# Patient Record
Sex: Female | Born: 1945 | Race: White | Hispanic: No | Marital: Married | Smoking: Never smoker
Health system: Southern US, Community
[De-identification: ages and names within clinical notes are randomized; demographics above are authoritative.]

## PROBLEM LIST (undated history)

## (undated) DIAGNOSIS — I639 Cerebral infarction, unspecified: Secondary | ICD-10-CM

## (undated) DIAGNOSIS — Z8701 Personal history of pneumonia (recurrent): Secondary | ICD-10-CM

## (undated) DIAGNOSIS — R269 Unspecified abnormalities of gait and mobility: Secondary | ICD-10-CM

## (undated) DIAGNOSIS — I69359 Hemiplegia and hemiparesis following cerebral infarction affecting unspecified side: Secondary | ICD-10-CM

## (undated) DIAGNOSIS — E119 Type 2 diabetes mellitus without complications: Secondary | ICD-10-CM

## (undated) DIAGNOSIS — I10 Essential (primary) hypertension: Secondary | ICD-10-CM

## (undated) DIAGNOSIS — I69398 Other sequelae of cerebral infarction: Principal | ICD-10-CM

## (undated) DIAGNOSIS — Z952 Presence of prosthetic heart valve: Secondary | ICD-10-CM

## (undated) DIAGNOSIS — T7840XA Allergy, unspecified, initial encounter: Secondary | ICD-10-CM

## (undated) DIAGNOSIS — K219 Gastro-esophageal reflux disease without esophagitis: Secondary | ICD-10-CM

## (undated) HISTORY — DX: Allergy, unspecified, initial encounter: T78.40XA

## (undated) HISTORY — PX: BRAIN SURGERY: SHX531

## (undated) HISTORY — DX: Other sequelae of cerebral infarction: I69.398

## (undated) HISTORY — DX: Type 2 diabetes mellitus without complications: E11.9

## (undated) HISTORY — DX: Presence of prosthetic heart valve: Z95.2

## (undated) HISTORY — PX: ABDOMINAL HYSTERECTOMY: SHX81

## (undated) HISTORY — DX: Unspecified abnormalities of gait and mobility: R26.9

## (undated) HISTORY — DX: Cerebral infarction, unspecified: I63.9

## (undated) HISTORY — DX: Hemiplegia and hemiparesis following cerebral infarction affecting unspecified side: I69.359

## (undated) HISTORY — DX: Personal history of pneumonia (recurrent): Z87.01

---

## 1991-11-09 HISTORY — PX: BUNIONECTOMY: SHX129

## 1994-11-08 HISTORY — PX: TOE SURGERY: SHX1073

## 1995-11-09 DIAGNOSIS — Z8701 Personal history of pneumonia (recurrent): Secondary | ICD-10-CM

## 1995-11-09 HISTORY — PX: CARDIAC VALVE REPLACEMENT: SHX585

## 1995-11-09 HISTORY — DX: Personal history of pneumonia (recurrent): Z87.01

## 2014-11-19 DIAGNOSIS — Z952 Presence of prosthetic heart valve: Secondary | ICD-10-CM | POA: Diagnosis not present

## 2014-11-20 DIAGNOSIS — K219 Gastro-esophageal reflux disease without esophagitis: Secondary | ICD-10-CM | POA: Diagnosis not present

## 2014-11-20 DIAGNOSIS — M810 Age-related osteoporosis without current pathological fracture: Secondary | ICD-10-CM | POA: Diagnosis not present

## 2014-11-20 DIAGNOSIS — M818 Other osteoporosis without current pathological fracture: Secondary | ICD-10-CM | POA: Diagnosis not present

## 2014-11-20 DIAGNOSIS — E559 Vitamin D deficiency, unspecified: Secondary | ICD-10-CM | POA: Diagnosis not present

## 2014-11-20 DIAGNOSIS — G464 Cerebellar stroke syndrome: Secondary | ICD-10-CM | POA: Diagnosis not present

## 2014-11-20 DIAGNOSIS — I638 Other cerebral infarction: Secondary | ICD-10-CM | POA: Diagnosis not present

## 2014-11-20 DIAGNOSIS — R739 Hyperglycemia, unspecified: Secondary | ICD-10-CM | POA: Diagnosis not present

## 2014-11-20 DIAGNOSIS — E785 Hyperlipidemia, unspecified: Secondary | ICD-10-CM | POA: Diagnosis not present

## 2014-11-20 DIAGNOSIS — R05 Cough: Secondary | ICD-10-CM | POA: Diagnosis not present

## 2014-11-20 DIAGNOSIS — I348 Other nonrheumatic mitral valve disorders: Secondary | ICD-10-CM | POA: Diagnosis not present

## 2014-11-20 DIAGNOSIS — I10 Essential (primary) hypertension: Secondary | ICD-10-CM | POA: Diagnosis not present

## 2014-11-21 DIAGNOSIS — M779 Enthesopathy, unspecified: Secondary | ICD-10-CM | POA: Diagnosis not present

## 2014-11-21 DIAGNOSIS — M6281 Muscle weakness (generalized): Secondary | ICD-10-CM | POA: Diagnosis not present

## 2014-11-21 DIAGNOSIS — M722 Plantar fascial fibromatosis: Secondary | ICD-10-CM | POA: Diagnosis not present

## 2014-11-21 DIAGNOSIS — R260 Ataxic gait: Secondary | ICD-10-CM | POA: Diagnosis not present

## 2014-11-28 DIAGNOSIS — R260 Ataxic gait: Secondary | ICD-10-CM | POA: Diagnosis not present

## 2014-11-28 DIAGNOSIS — M722 Plantar fascial fibromatosis: Secondary | ICD-10-CM | POA: Diagnosis not present

## 2014-11-28 DIAGNOSIS — M6281 Muscle weakness (generalized): Secondary | ICD-10-CM | POA: Diagnosis not present

## 2014-11-28 DIAGNOSIS — M779 Enthesopathy, unspecified: Secondary | ICD-10-CM | POA: Diagnosis not present

## 2014-12-02 DIAGNOSIS — M779 Enthesopathy, unspecified: Secondary | ICD-10-CM | POA: Diagnosis not present

## 2014-12-02 DIAGNOSIS — M6281 Muscle weakness (generalized): Secondary | ICD-10-CM | POA: Diagnosis not present

## 2014-12-02 DIAGNOSIS — R260 Ataxic gait: Secondary | ICD-10-CM | POA: Diagnosis not present

## 2014-12-02 DIAGNOSIS — M722 Plantar fascial fibromatosis: Secondary | ICD-10-CM | POA: Diagnosis not present

## 2014-12-17 DIAGNOSIS — Z952 Presence of prosthetic heart valve: Secondary | ICD-10-CM | POA: Diagnosis not present

## 2014-12-18 DIAGNOSIS — M779 Enthesopathy, unspecified: Secondary | ICD-10-CM | POA: Diagnosis not present

## 2014-12-18 DIAGNOSIS — M6281 Muscle weakness (generalized): Secondary | ICD-10-CM | POA: Diagnosis not present

## 2014-12-18 DIAGNOSIS — M722 Plantar fascial fibromatosis: Secondary | ICD-10-CM | POA: Diagnosis not present

## 2014-12-18 DIAGNOSIS — R260 Ataxic gait: Secondary | ICD-10-CM | POA: Diagnosis not present

## 2015-01-01 DIAGNOSIS — M722 Plantar fascial fibromatosis: Secondary | ICD-10-CM | POA: Diagnosis not present

## 2015-01-01 DIAGNOSIS — M779 Enthesopathy, unspecified: Secondary | ICD-10-CM | POA: Diagnosis not present

## 2015-01-01 DIAGNOSIS — M6281 Muscle weakness (generalized): Secondary | ICD-10-CM | POA: Diagnosis not present

## 2015-01-01 DIAGNOSIS — R260 Ataxic gait: Secondary | ICD-10-CM | POA: Diagnosis not present

## 2015-01-06 DIAGNOSIS — M779 Enthesopathy, unspecified: Secondary | ICD-10-CM | POA: Diagnosis not present

## 2015-01-06 DIAGNOSIS — M6281 Muscle weakness (generalized): Secondary | ICD-10-CM | POA: Diagnosis not present

## 2015-01-06 DIAGNOSIS — M722 Plantar fascial fibromatosis: Secondary | ICD-10-CM | POA: Diagnosis not present

## 2015-01-06 DIAGNOSIS — R260 Ataxic gait: Secondary | ICD-10-CM | POA: Diagnosis not present

## 2015-01-13 DIAGNOSIS — M722 Plantar fascial fibromatosis: Secondary | ICD-10-CM | POA: Diagnosis not present

## 2015-01-13 DIAGNOSIS — R260 Ataxic gait: Secondary | ICD-10-CM | POA: Diagnosis not present

## 2015-01-13 DIAGNOSIS — M779 Enthesopathy, unspecified: Secondary | ICD-10-CM | POA: Diagnosis not present

## 2015-01-13 DIAGNOSIS — M6281 Muscle weakness (generalized): Secondary | ICD-10-CM | POA: Diagnosis not present

## 2015-01-14 DIAGNOSIS — Z952 Presence of prosthetic heart valve: Secondary | ICD-10-CM | POA: Diagnosis not present

## 2015-01-20 DIAGNOSIS — M6281 Muscle weakness (generalized): Secondary | ICD-10-CM | POA: Diagnosis not present

## 2015-01-20 DIAGNOSIS — M779 Enthesopathy, unspecified: Secondary | ICD-10-CM | POA: Diagnosis not present

## 2015-01-20 DIAGNOSIS — R260 Ataxic gait: Secondary | ICD-10-CM | POA: Diagnosis not present

## 2015-01-20 DIAGNOSIS — M722 Plantar fascial fibromatosis: Secondary | ICD-10-CM | POA: Diagnosis not present

## 2015-01-27 DIAGNOSIS — M779 Enthesopathy, unspecified: Secondary | ICD-10-CM | POA: Diagnosis not present

## 2015-01-27 DIAGNOSIS — M722 Plantar fascial fibromatosis: Secondary | ICD-10-CM | POA: Diagnosis not present

## 2015-01-27 DIAGNOSIS — M6281 Muscle weakness (generalized): Secondary | ICD-10-CM | POA: Diagnosis not present

## 2015-01-27 DIAGNOSIS — R260 Ataxic gait: Secondary | ICD-10-CM | POA: Diagnosis not present

## 2015-02-05 DIAGNOSIS — D72818 Other decreased white blood cell count: Secondary | ICD-10-CM | POA: Diagnosis not present

## 2015-02-05 DIAGNOSIS — I638 Other cerebral infarction: Secondary | ICD-10-CM | POA: Diagnosis not present

## 2015-02-05 DIAGNOSIS — R5383 Other fatigue: Secondary | ICD-10-CM | POA: Diagnosis not present

## 2015-02-05 DIAGNOSIS — I10 Essential (primary) hypertension: Secondary | ICD-10-CM | POA: Diagnosis not present

## 2015-02-05 DIAGNOSIS — R05 Cough: Secondary | ICD-10-CM | POA: Diagnosis not present

## 2015-02-05 DIAGNOSIS — M818 Other osteoporosis without current pathological fracture: Secondary | ICD-10-CM | POA: Diagnosis not present

## 2015-02-05 DIAGNOSIS — E139 Other specified diabetes mellitus without complications: Secondary | ICD-10-CM | POA: Diagnosis not present

## 2015-02-10 DIAGNOSIS — M779 Enthesopathy, unspecified: Secondary | ICD-10-CM | POA: Diagnosis not present

## 2015-02-10 DIAGNOSIS — R260 Ataxic gait: Secondary | ICD-10-CM | POA: Diagnosis not present

## 2015-02-10 DIAGNOSIS — M722 Plantar fascial fibromatosis: Secondary | ICD-10-CM | POA: Diagnosis not present

## 2015-02-10 DIAGNOSIS — M6281 Muscle weakness (generalized): Secondary | ICD-10-CM | POA: Diagnosis not present

## 2015-02-11 DIAGNOSIS — Z952 Presence of prosthetic heart valve: Secondary | ICD-10-CM | POA: Diagnosis not present

## 2015-02-17 DIAGNOSIS — I6930 Unspecified sequelae of cerebral infarction: Secondary | ICD-10-CM | POA: Diagnosis not present

## 2015-02-17 DIAGNOSIS — E785 Hyperlipidemia, unspecified: Secondary | ICD-10-CM | POA: Diagnosis not present

## 2015-02-17 DIAGNOSIS — I638 Other cerebral infarction: Secondary | ICD-10-CM | POA: Diagnosis not present

## 2015-02-17 DIAGNOSIS — M722 Plantar fascial fibromatosis: Secondary | ICD-10-CM | POA: Diagnosis not present

## 2015-02-17 DIAGNOSIS — M6281 Muscle weakness (generalized): Secondary | ICD-10-CM | POA: Diagnosis not present

## 2015-02-17 DIAGNOSIS — I10 Essential (primary) hypertension: Secondary | ICD-10-CM | POA: Diagnosis not present

## 2015-02-17 DIAGNOSIS — E559 Vitamin D deficiency, unspecified: Secondary | ICD-10-CM | POA: Diagnosis not present

## 2015-02-17 DIAGNOSIS — Z1389 Encounter for screening for other disorder: Secondary | ICD-10-CM | POA: Diagnosis not present

## 2015-02-17 DIAGNOSIS — R05 Cough: Secondary | ICD-10-CM | POA: Diagnosis not present

## 2015-02-17 DIAGNOSIS — K219 Gastro-esophageal reflux disease without esophagitis: Secondary | ICD-10-CM | POA: Diagnosis not present

## 2015-02-17 DIAGNOSIS — I348 Other nonrheumatic mitral valve disorders: Secondary | ICD-10-CM | POA: Diagnosis not present

## 2015-02-17 DIAGNOSIS — R260 Ataxic gait: Secondary | ICD-10-CM | POA: Diagnosis not present

## 2015-02-17 DIAGNOSIS — M779 Enthesopathy, unspecified: Secondary | ICD-10-CM | POA: Diagnosis not present

## 2015-02-24 DIAGNOSIS — M6281 Muscle weakness (generalized): Secondary | ICD-10-CM | POA: Diagnosis not present

## 2015-02-24 DIAGNOSIS — M779 Enthesopathy, unspecified: Secondary | ICD-10-CM | POA: Diagnosis not present

## 2015-02-24 DIAGNOSIS — M722 Plantar fascial fibromatosis: Secondary | ICD-10-CM | POA: Diagnosis not present

## 2015-02-24 DIAGNOSIS — R260 Ataxic gait: Secondary | ICD-10-CM | POA: Diagnosis not present

## 2015-02-27 DIAGNOSIS — Z7901 Long term (current) use of anticoagulants: Secondary | ICD-10-CM | POA: Diagnosis not present

## 2015-02-27 DIAGNOSIS — I340 Nonrheumatic mitral (valve) insufficiency: Secondary | ICD-10-CM | POA: Diagnosis not present

## 2015-02-27 DIAGNOSIS — I6350 Cerebral infarction due to unspecified occlusion or stenosis of unspecified cerebral artery: Secondary | ICD-10-CM | POA: Diagnosis not present

## 2015-02-27 DIAGNOSIS — I119 Hypertensive heart disease without heart failure: Secondary | ICD-10-CM | POA: Diagnosis not present

## 2015-03-03 DIAGNOSIS — M6281 Muscle weakness (generalized): Secondary | ICD-10-CM | POA: Diagnosis not present

## 2015-03-03 DIAGNOSIS — M722 Plantar fascial fibromatosis: Secondary | ICD-10-CM | POA: Diagnosis not present

## 2015-03-03 DIAGNOSIS — R260 Ataxic gait: Secondary | ICD-10-CM | POA: Diagnosis not present

## 2015-03-03 DIAGNOSIS — M779 Enthesopathy, unspecified: Secondary | ICD-10-CM | POA: Diagnosis not present

## 2015-03-07 DIAGNOSIS — N200 Calculus of kidney: Secondary | ICD-10-CM | POA: Diagnosis not present

## 2015-03-07 DIAGNOSIS — R319 Hematuria, unspecified: Secondary | ICD-10-CM | POA: Diagnosis not present

## 2015-03-07 DIAGNOSIS — N390 Urinary tract infection, site not specified: Secondary | ICD-10-CM | POA: Diagnosis not present

## 2015-03-07 DIAGNOSIS — Z6820 Body mass index (BMI) 20.0-20.9, adult: Secondary | ICD-10-CM | POA: Diagnosis not present

## 2015-03-11 DIAGNOSIS — Z952 Presence of prosthetic heart valve: Secondary | ICD-10-CM | POA: Diagnosis not present

## 2015-03-12 DIAGNOSIS — M722 Plantar fascial fibromatosis: Secondary | ICD-10-CM | POA: Diagnosis not present

## 2015-03-12 DIAGNOSIS — M779 Enthesopathy, unspecified: Secondary | ICD-10-CM | POA: Diagnosis not present

## 2015-03-12 DIAGNOSIS — M6281 Muscle weakness (generalized): Secondary | ICD-10-CM | POA: Diagnosis not present

## 2015-03-12 DIAGNOSIS — R260 Ataxic gait: Secondary | ICD-10-CM | POA: Diagnosis not present

## 2015-03-17 DIAGNOSIS — R260 Ataxic gait: Secondary | ICD-10-CM | POA: Diagnosis not present

## 2015-03-17 DIAGNOSIS — M779 Enthesopathy, unspecified: Secondary | ICD-10-CM | POA: Diagnosis not present

## 2015-03-17 DIAGNOSIS — M6281 Muscle weakness (generalized): Secondary | ICD-10-CM | POA: Diagnosis not present

## 2015-03-17 DIAGNOSIS — M722 Plantar fascial fibromatosis: Secondary | ICD-10-CM | POA: Diagnosis not present

## 2015-03-21 DIAGNOSIS — Z1231 Encounter for screening mammogram for malignant neoplasm of breast: Secondary | ICD-10-CM | POA: Diagnosis not present

## 2015-03-24 DIAGNOSIS — H16223 Keratoconjunctivitis sicca, not specified as Sjogren's, bilateral: Secondary | ICD-10-CM | POA: Diagnosis not present

## 2015-03-24 DIAGNOSIS — H2513 Age-related nuclear cataract, bilateral: Secondary | ICD-10-CM | POA: Diagnosis not present

## 2015-03-24 DIAGNOSIS — H5203 Hypermetropia, bilateral: Secondary | ICD-10-CM | POA: Diagnosis not present

## 2015-03-25 DIAGNOSIS — N209 Urinary calculus, unspecified: Secondary | ICD-10-CM | POA: Diagnosis not present

## 2015-03-25 DIAGNOSIS — Z87442 Personal history of urinary calculi: Secondary | ICD-10-CM | POA: Diagnosis not present

## 2015-03-25 DIAGNOSIS — R319 Hematuria, unspecified: Secondary | ICD-10-CM | POA: Diagnosis not present

## 2015-03-25 DIAGNOSIS — N3000 Acute cystitis without hematuria: Secondary | ICD-10-CM | POA: Diagnosis not present

## 2015-03-31 DIAGNOSIS — M6281 Muscle weakness (generalized): Secondary | ICD-10-CM | POA: Diagnosis not present

## 2015-03-31 DIAGNOSIS — M722 Plantar fascial fibromatosis: Secondary | ICD-10-CM | POA: Diagnosis not present

## 2015-03-31 DIAGNOSIS — R260 Ataxic gait: Secondary | ICD-10-CM | POA: Diagnosis not present

## 2015-03-31 DIAGNOSIS — M779 Enthesopathy, unspecified: Secondary | ICD-10-CM | POA: Diagnosis not present

## 2015-04-08 DIAGNOSIS — Z952 Presence of prosthetic heart valve: Secondary | ICD-10-CM | POA: Diagnosis not present

## 2015-04-08 DIAGNOSIS — R319 Hematuria, unspecified: Secondary | ICD-10-CM | POA: Diagnosis not present

## 2015-04-14 DIAGNOSIS — R260 Ataxic gait: Secondary | ICD-10-CM | POA: Diagnosis not present

## 2015-04-14 DIAGNOSIS — M6281 Muscle weakness (generalized): Secondary | ICD-10-CM | POA: Diagnosis not present

## 2015-04-14 DIAGNOSIS — M722 Plantar fascial fibromatosis: Secondary | ICD-10-CM | POA: Diagnosis not present

## 2015-04-14 DIAGNOSIS — M779 Enthesopathy, unspecified: Secondary | ICD-10-CM | POA: Diagnosis not present

## 2015-04-15 DIAGNOSIS — R319 Hematuria, unspecified: Secondary | ICD-10-CM | POA: Diagnosis not present

## 2015-04-22 DIAGNOSIS — R319 Hematuria, unspecified: Secondary | ICD-10-CM | POA: Diagnosis not present

## 2015-05-06 DIAGNOSIS — Z952 Presence of prosthetic heart valve: Secondary | ICD-10-CM | POA: Diagnosis not present

## 2015-05-13 DIAGNOSIS — Z7901 Long term (current) use of anticoagulants: Secondary | ICD-10-CM | POA: Diagnosis not present

## 2015-05-13 DIAGNOSIS — I340 Nonrheumatic mitral (valve) insufficiency: Secondary | ICD-10-CM | POA: Diagnosis not present

## 2015-05-13 DIAGNOSIS — I119 Hypertensive heart disease without heart failure: Secondary | ICD-10-CM | POA: Diagnosis not present

## 2015-05-13 DIAGNOSIS — I6350 Cerebral infarction due to unspecified occlusion or stenosis of unspecified cerebral artery: Secondary | ICD-10-CM | POA: Diagnosis not present

## 2015-05-14 DIAGNOSIS — R7301 Impaired fasting glucose: Secondary | ICD-10-CM | POA: Diagnosis not present

## 2015-05-14 DIAGNOSIS — R7309 Other abnormal glucose: Secondary | ICD-10-CM | POA: Diagnosis not present

## 2015-05-14 DIAGNOSIS — K219 Gastro-esophageal reflux disease without esophagitis: Secondary | ICD-10-CM | POA: Diagnosis not present

## 2015-05-14 DIAGNOSIS — I10 Essential (primary) hypertension: Secondary | ICD-10-CM | POA: Diagnosis not present

## 2015-05-14 DIAGNOSIS — R262 Difficulty in walking, not elsewhere classified: Secondary | ICD-10-CM | POA: Diagnosis not present

## 2015-05-14 DIAGNOSIS — I638 Other cerebral infarction: Secondary | ICD-10-CM | POA: Diagnosis not present

## 2015-05-14 DIAGNOSIS — I6930 Unspecified sequelae of cerebral infarction: Secondary | ICD-10-CM | POA: Diagnosis not present

## 2015-05-21 DIAGNOSIS — K219 Gastro-esophageal reflux disease without esophagitis: Secondary | ICD-10-CM | POA: Diagnosis not present

## 2015-05-21 DIAGNOSIS — I348 Other nonrheumatic mitral valve disorders: Secondary | ICD-10-CM | POA: Diagnosis not present

## 2015-05-21 DIAGNOSIS — M810 Age-related osteoporosis without current pathological fracture: Secondary | ICD-10-CM | POA: Diagnosis not present

## 2015-05-21 DIAGNOSIS — I638 Other cerebral infarction: Secondary | ICD-10-CM | POA: Diagnosis not present

## 2015-05-21 DIAGNOSIS — M818 Other osteoporosis without current pathological fracture: Secondary | ICD-10-CM | POA: Diagnosis not present

## 2015-05-21 DIAGNOSIS — R05 Cough: Secondary | ICD-10-CM | POA: Diagnosis not present

## 2015-05-21 DIAGNOSIS — E860 Dehydration: Secondary | ICD-10-CM | POA: Diagnosis not present

## 2015-05-21 DIAGNOSIS — R7301 Impaired fasting glucose: Secondary | ICD-10-CM | POA: Diagnosis not present

## 2015-05-21 DIAGNOSIS — Z7901 Long term (current) use of anticoagulants: Secondary | ICD-10-CM | POA: Diagnosis not present

## 2015-05-21 DIAGNOSIS — E785 Hyperlipidemia, unspecified: Secondary | ICD-10-CM | POA: Diagnosis not present

## 2015-05-21 DIAGNOSIS — K649 Unspecified hemorrhoids: Secondary | ICD-10-CM | POA: Diagnosis not present

## 2015-05-21 DIAGNOSIS — I10 Essential (primary) hypertension: Secondary | ICD-10-CM | POA: Diagnosis not present

## 2015-05-27 DIAGNOSIS — Z7901 Long term (current) use of anticoagulants: Secondary | ICD-10-CM | POA: Diagnosis not present

## 2015-05-27 DIAGNOSIS — I639 Cerebral infarction, unspecified: Secondary | ICD-10-CM | POA: Diagnosis not present

## 2015-05-27 DIAGNOSIS — R2681 Unsteadiness on feet: Secondary | ICD-10-CM | POA: Diagnosis not present

## 2015-05-27 DIAGNOSIS — Z8673 Personal history of transient ischemic attack (TIA), and cerebral infarction without residual deficits: Secondary | ICD-10-CM | POA: Diagnosis not present

## 2015-05-27 DIAGNOSIS — I071 Rheumatic tricuspid insufficiency: Secondary | ICD-10-CM | POA: Diagnosis not present

## 2015-05-27 DIAGNOSIS — G319 Degenerative disease of nervous system, unspecified: Secondary | ICD-10-CM | POA: Diagnosis not present

## 2015-05-27 DIAGNOSIS — R9089 Other abnormal findings on diagnostic imaging of central nervous system: Secondary | ICD-10-CM | POA: Diagnosis not present

## 2015-05-27 DIAGNOSIS — R270 Ataxia, unspecified: Secondary | ICD-10-CM | POA: Diagnosis not present

## 2015-05-27 DIAGNOSIS — Z954 Presence of other heart-valve replacement: Secondary | ICD-10-CM | POA: Diagnosis not present

## 2015-05-27 DIAGNOSIS — E785 Hyperlipidemia, unspecified: Secondary | ICD-10-CM | POA: Diagnosis not present

## 2015-05-27 DIAGNOSIS — R531 Weakness: Secondary | ICD-10-CM | POA: Diagnosis not present

## 2015-05-27 DIAGNOSIS — Z888 Allergy status to other drugs, medicaments and biological substances status: Secondary | ICD-10-CM | POA: Diagnosis not present

## 2015-05-27 DIAGNOSIS — G9389 Other specified disorders of brain: Secondary | ICD-10-CM | POA: Diagnosis not present

## 2015-05-27 DIAGNOSIS — I10 Essential (primary) hypertension: Secondary | ICD-10-CM | POA: Diagnosis not present

## 2015-05-27 DIAGNOSIS — G459 Transient cerebral ischemic attack, unspecified: Secondary | ICD-10-CM | POA: Diagnosis not present

## 2015-05-27 DIAGNOSIS — M810 Age-related osteoporosis without current pathological fracture: Secondary | ICD-10-CM | POA: Diagnosis not present

## 2015-05-28 DIAGNOSIS — Z7901 Long term (current) use of anticoagulants: Secondary | ICD-10-CM | POA: Diagnosis not present

## 2015-05-28 DIAGNOSIS — R531 Weakness: Secondary | ICD-10-CM | POA: Diagnosis not present

## 2015-05-28 DIAGNOSIS — Z8673 Personal history of transient ischemic attack (TIA), and cerebral infarction without residual deficits: Secondary | ICD-10-CM | POA: Diagnosis not present

## 2015-05-28 DIAGNOSIS — R2681 Unsteadiness on feet: Secondary | ICD-10-CM | POA: Diagnosis not present

## 2015-05-29 DIAGNOSIS — I348 Other nonrheumatic mitral valve disorders: Secondary | ICD-10-CM | POA: Diagnosis not present

## 2015-05-29 DIAGNOSIS — E559 Vitamin D deficiency, unspecified: Secondary | ICD-10-CM | POA: Diagnosis not present

## 2015-05-29 DIAGNOSIS — I10 Essential (primary) hypertension: Secondary | ICD-10-CM | POA: Diagnosis not present

## 2015-05-29 DIAGNOSIS — G459 Transient cerebral ischemic attack, unspecified: Secondary | ICD-10-CM | POA: Diagnosis not present

## 2015-06-10 DIAGNOSIS — Z952 Presence of prosthetic heart valve: Secondary | ICD-10-CM | POA: Diagnosis not present

## 2015-06-19 DIAGNOSIS — M722 Plantar fascial fibromatosis: Secondary | ICD-10-CM | POA: Diagnosis not present

## 2015-06-19 DIAGNOSIS — M6281 Muscle weakness (generalized): Secondary | ICD-10-CM | POA: Diagnosis not present

## 2015-06-19 DIAGNOSIS — R260 Ataxic gait: Secondary | ICD-10-CM | POA: Diagnosis not present

## 2015-06-19 DIAGNOSIS — M779 Enthesopathy, unspecified: Secondary | ICD-10-CM | POA: Diagnosis not present

## 2015-06-24 DIAGNOSIS — R05 Cough: Secondary | ICD-10-CM | POA: Diagnosis not present

## 2015-06-24 DIAGNOSIS — K649 Unspecified hemorrhoids: Secondary | ICD-10-CM | POA: Diagnosis not present

## 2015-06-24 DIAGNOSIS — I638 Other cerebral infarction: Secondary | ICD-10-CM | POA: Diagnosis not present

## 2015-06-24 DIAGNOSIS — E559 Vitamin D deficiency, unspecified: Secondary | ICD-10-CM | POA: Diagnosis not present

## 2015-06-24 DIAGNOSIS — K219 Gastro-esophageal reflux disease without esophagitis: Secondary | ICD-10-CM | POA: Diagnosis not present

## 2015-06-24 DIAGNOSIS — I348 Other nonrheumatic mitral valve disorders: Secondary | ICD-10-CM | POA: Diagnosis not present

## 2015-06-24 DIAGNOSIS — E785 Hyperlipidemia, unspecified: Secondary | ICD-10-CM | POA: Diagnosis not present

## 2015-06-24 DIAGNOSIS — I10 Essential (primary) hypertension: Secondary | ICD-10-CM | POA: Diagnosis not present

## 2015-06-26 DIAGNOSIS — M6281 Muscle weakness (generalized): Secondary | ICD-10-CM | POA: Diagnosis not present

## 2015-06-26 DIAGNOSIS — R260 Ataxic gait: Secondary | ICD-10-CM | POA: Diagnosis not present

## 2015-06-26 DIAGNOSIS — M779 Enthesopathy, unspecified: Secondary | ICD-10-CM | POA: Diagnosis not present

## 2015-06-26 DIAGNOSIS — M722 Plantar fascial fibromatosis: Secondary | ICD-10-CM | POA: Diagnosis not present

## 2015-07-03 ENCOUNTER — Ambulatory Visit (INDEPENDENT_AMBULATORY_CARE_PROVIDER_SITE_OTHER): Payer: Medicare Other | Admitting: Family Medicine

## 2015-07-03 VITALS — BP 102/60 | HR 75 | Temp 97.8°F | Resp 16 | Ht 66.0 in | Wt 127.9 lb

## 2015-07-03 DIAGNOSIS — H6691 Otitis media, unspecified, right ear: Secondary | ICD-10-CM | POA: Diagnosis not present

## 2015-07-03 DIAGNOSIS — J029 Acute pharyngitis, unspecified: Secondary | ICD-10-CM

## 2015-07-03 LAB — POCT RAPID STREP A (OFFICE): Rapid Strep A Screen: NEGATIVE

## 2015-07-03 MED ORDER — AMOXICILLIN 875 MG PO TABS: 875.0000 mg | ORAL_TABLET | Freq: Two times a day (BID) | ORAL | Status: DC

## 2015-07-03 NOTE — Progress Notes (Signed)
Sore throat Subjective:  Patient ID: [name], female    DOB: [birth year]  Age: 69 y.o. MRN: [id number]  69 year old lady who has a sore throat. It started yesterday morning night before. She has not been running any fever. She does not complain of her ears or head or cough. She has had a little nasal congestion here in the cold examining room.  Her husband has not had any symptoms, nor have any close relatives that she knows of  She has a history of several strokes and has had a valve replaced so she is on chronic anticoagulation therapy   Objective:   Pleasant alert lady in no major distress. Left TM is normal. Right TM has redness a couple of millimeters along the base of the drum. Her throat is red without exudate. Strep culture taken. Neck supple small anterior cervical node. Chest is clear to auscultation. Heart has the click of the artificial valve. Is regular.  Assessment & Plan:   Assessment:  Sore throat Early right otitis  Plan:  Strep test and culture if negative  Results for orders placed or performed in visit on 07/03/15  POCT rapid strep A  Result Value Ref Range   Rapid Strep A Screen Negative Negative     There are no Patient Instructions on file for this visit.   Robina Hamor, MD 07/03/2015

## 2015-07-03 NOTE — Patient Instructions (Signed)
Take amoxicillin one twice daily  We will let you know in a few days the results of your culture. Should you not hear from [place] by Monday please call back. If it is negative you can discontinue the anabiotics if the ear is feeling well.  Return as needed

## 2015-07-05 LAB — CULTURE, GROUP A STREP: Organism ID, Bacteria: NORMAL

## 2015-07-08 DIAGNOSIS — Z952 Presence of prosthetic heart valve: Secondary | ICD-10-CM | POA: Diagnosis not present

## 2015-07-16 DIAGNOSIS — E039 Hypothyroidism, unspecified: Secondary | ICD-10-CM | POA: Diagnosis not present

## 2015-07-16 DIAGNOSIS — R260 Ataxic gait: Secondary | ICD-10-CM | POA: Diagnosis not present

## 2015-07-16 DIAGNOSIS — M859 Disorder of bone density and structure, unspecified: Secondary | ICD-10-CM | POA: Diagnosis not present

## 2015-07-16 DIAGNOSIS — I10 Essential (primary) hypertension: Secondary | ICD-10-CM | POA: Diagnosis not present

## 2015-07-16 DIAGNOSIS — E139 Other specified diabetes mellitus without complications: Secondary | ICD-10-CM | POA: Diagnosis not present

## 2015-07-16 DIAGNOSIS — I348 Other nonrheumatic mitral valve disorders: Secondary | ICD-10-CM | POA: Diagnosis not present

## 2015-07-16 DIAGNOSIS — R05 Cough: Secondary | ICD-10-CM | POA: Diagnosis not present

## 2015-07-16 DIAGNOSIS — M6281 Muscle weakness (generalized): Secondary | ICD-10-CM | POA: Diagnosis not present

## 2015-07-16 DIAGNOSIS — M722 Plantar fascial fibromatosis: Secondary | ICD-10-CM | POA: Diagnosis not present

## 2015-07-16 DIAGNOSIS — E559 Vitamin D deficiency, unspecified: Secondary | ICD-10-CM | POA: Diagnosis not present

## 2015-07-16 DIAGNOSIS — N959 Unspecified menopausal and perimenopausal disorder: Secondary | ICD-10-CM | POA: Diagnosis not present

## 2015-07-16 DIAGNOSIS — M779 Enthesopathy, unspecified: Secondary | ICD-10-CM | POA: Diagnosis not present

## 2015-07-16 DIAGNOSIS — R5383 Other fatigue: Secondary | ICD-10-CM | POA: Diagnosis not present

## 2015-07-18 DIAGNOSIS — M779 Enthesopathy, unspecified: Secondary | ICD-10-CM | POA: Diagnosis not present

## 2015-07-18 DIAGNOSIS — R260 Ataxic gait: Secondary | ICD-10-CM | POA: Diagnosis not present

## 2015-07-18 DIAGNOSIS — M722 Plantar fascial fibromatosis: Secondary | ICD-10-CM | POA: Diagnosis not present

## 2015-07-18 DIAGNOSIS — M6281 Muscle weakness (generalized): Secondary | ICD-10-CM | POA: Diagnosis not present

## 2015-07-23 DIAGNOSIS — Z1211 Encounter for screening for malignant neoplasm of colon: Secondary | ICD-10-CM | POA: Diagnosis not present

## 2015-07-23 DIAGNOSIS — Z1212 Encounter for screening for malignant neoplasm of rectum: Secondary | ICD-10-CM | POA: Diagnosis not present

## 2015-07-24 DIAGNOSIS — M779 Enthesopathy, unspecified: Secondary | ICD-10-CM | POA: Diagnosis not present

## 2015-07-24 DIAGNOSIS — R260 Ataxic gait: Secondary | ICD-10-CM | POA: Diagnosis not present

## 2015-07-24 DIAGNOSIS — M722 Plantar fascial fibromatosis: Secondary | ICD-10-CM | POA: Diagnosis not present

## 2015-07-24 DIAGNOSIS — M6281 Muscle weakness (generalized): Secondary | ICD-10-CM | POA: Diagnosis not present

## 2015-07-29 DIAGNOSIS — M6281 Muscle weakness (generalized): Secondary | ICD-10-CM | POA: Diagnosis not present

## 2015-07-29 DIAGNOSIS — R260 Ataxic gait: Secondary | ICD-10-CM | POA: Diagnosis not present

## 2015-07-29 DIAGNOSIS — M722 Plantar fascial fibromatosis: Secondary | ICD-10-CM | POA: Diagnosis not present

## 2015-07-29 DIAGNOSIS — M779 Enthesopathy, unspecified: Secondary | ICD-10-CM | POA: Diagnosis not present

## 2015-08-04 DIAGNOSIS — Z952 Presence of prosthetic heart valve: Secondary | ICD-10-CM | POA: Diagnosis not present

## 2015-08-11 DIAGNOSIS — Z7901 Long term (current) use of anticoagulants: Secondary | ICD-10-CM | POA: Diagnosis not present

## 2015-08-11 DIAGNOSIS — I348 Other nonrheumatic mitral valve disorders: Secondary | ICD-10-CM | POA: Diagnosis not present

## 2015-08-15 DIAGNOSIS — L03032 Cellulitis of left toe: Secondary | ICD-10-CM | POA: Diagnosis not present

## 2015-08-18 DIAGNOSIS — I349 Nonrheumatic mitral valve disorder, unspecified: Secondary | ICD-10-CM | POA: Diagnosis not present

## 2015-08-18 DIAGNOSIS — Z7901 Long term (current) use of anticoagulants: Secondary | ICD-10-CM | POA: Diagnosis not present

## 2015-08-20 DIAGNOSIS — I69354 Hemiplegia and hemiparesis following cerebral infarction affecting left non-dominant side: Secondary | ICD-10-CM | POA: Diagnosis not present

## 2015-08-25 DIAGNOSIS — Z7901 Long term (current) use of anticoagulants: Secondary | ICD-10-CM | POA: Diagnosis not present

## 2015-08-25 DIAGNOSIS — I349 Nonrheumatic mitral valve disorder, unspecified: Secondary | ICD-10-CM | POA: Diagnosis not present

## 2015-08-29 DIAGNOSIS — L03032 Cellulitis of left toe: Secondary | ICD-10-CM | POA: Diagnosis not present

## 2015-09-01 DIAGNOSIS — Z952 Presence of prosthetic heart valve: Secondary | ICD-10-CM | POA: Diagnosis not present

## 2015-09-01 DIAGNOSIS — I349 Nonrheumatic mitral valve disorder, unspecified: Secondary | ICD-10-CM | POA: Diagnosis not present

## 2015-09-01 DIAGNOSIS — Z23 Encounter for immunization: Secondary | ICD-10-CM | POA: Diagnosis not present

## 2015-09-01 DIAGNOSIS — Z7901 Long term (current) use of anticoagulants: Secondary | ICD-10-CM | POA: Diagnosis not present

## 2015-09-02 DIAGNOSIS — I679 Cerebrovascular disease, unspecified: Secondary | ICD-10-CM | POA: Diagnosis not present

## 2015-09-02 DIAGNOSIS — R7301 Impaired fasting glucose: Secondary | ICD-10-CM | POA: Diagnosis not present

## 2015-09-02 DIAGNOSIS — Z1389 Encounter for screening for other disorder: Secondary | ICD-10-CM | POA: Diagnosis not present

## 2015-09-02 DIAGNOSIS — E7800 Pure hypercholesterolemia, unspecified: Secondary | ICD-10-CM | POA: Diagnosis not present

## 2015-09-02 DIAGNOSIS — I69998 Other sequelae following unspecified cerebrovascular disease: Secondary | ICD-10-CM | POA: Diagnosis not present

## 2015-09-02 DIAGNOSIS — D692 Other nonthrombocytopenic purpura: Secondary | ICD-10-CM | POA: Diagnosis not present

## 2015-09-02 DIAGNOSIS — L03039 Cellulitis of unspecified toe: Secondary | ICD-10-CM | POA: Diagnosis not present

## 2015-09-02 DIAGNOSIS — K219 Gastro-esophageal reflux disease without esophagitis: Secondary | ICD-10-CM | POA: Diagnosis not present

## 2015-09-02 DIAGNOSIS — Z681 Body mass index (BMI) 19 or less, adult: Secondary | ICD-10-CM | POA: Diagnosis not present

## 2015-09-02 DIAGNOSIS — Z7901 Long term (current) use of anticoagulants: Secondary | ICD-10-CM | POA: Diagnosis not present

## 2015-09-02 DIAGNOSIS — I10 Essential (primary) hypertension: Secondary | ICD-10-CM | POA: Diagnosis not present

## 2015-09-02 DIAGNOSIS — M810 Age-related osteoporosis without current pathological fracture: Secondary | ICD-10-CM | POA: Diagnosis not present

## 2015-09-04 ENCOUNTER — Ambulatory Visit: Payer: Self-pay | Admitting: Cardiovascular Disease

## 2015-09-08 ENCOUNTER — Encounter: Payer: Self-pay | Admitting: Cardiovascular Disease

## 2015-09-08 DIAGNOSIS — Z7901 Long term (current) use of anticoagulants: Secondary | ICD-10-CM | POA: Diagnosis not present

## 2015-09-08 DIAGNOSIS — I349 Nonrheumatic mitral valve disorder, unspecified: Secondary | ICD-10-CM | POA: Diagnosis not present

## 2015-09-23 DIAGNOSIS — Z7901 Long term (current) use of anticoagulants: Secondary | ICD-10-CM | POA: Diagnosis not present

## 2015-09-23 DIAGNOSIS — I349 Nonrheumatic mitral valve disorder, unspecified: Secondary | ICD-10-CM | POA: Diagnosis not present

## 2015-09-23 DIAGNOSIS — I6350 Cerebral infarction due to unspecified occlusion or stenosis of unspecified cerebral artery: Secondary | ICD-10-CM | POA: Diagnosis not present

## 2015-09-23 DIAGNOSIS — I340 Nonrheumatic mitral (valve) insufficiency: Secondary | ICD-10-CM | POA: Diagnosis not present

## 2015-09-29 DIAGNOSIS — I340 Nonrheumatic mitral (valve) insufficiency: Secondary | ICD-10-CM | POA: Diagnosis not present

## 2015-09-29 DIAGNOSIS — Z952 Presence of prosthetic heart valve: Secondary | ICD-10-CM | POA: Diagnosis not present

## 2015-09-29 DIAGNOSIS — I6350 Cerebral infarction due to unspecified occlusion or stenosis of unspecified cerebral artery: Secondary | ICD-10-CM | POA: Diagnosis not present

## 2015-09-29 DIAGNOSIS — Z7901 Long term (current) use of anticoagulants: Secondary | ICD-10-CM | POA: Diagnosis not present

## 2015-09-29 DIAGNOSIS — I349 Nonrheumatic mitral valve disorder, unspecified: Secondary | ICD-10-CM | POA: Diagnosis not present

## 2015-10-01 DIAGNOSIS — E7800 Pure hypercholesterolemia, unspecified: Secondary | ICD-10-CM | POA: Diagnosis not present

## 2015-10-06 DIAGNOSIS — I349 Nonrheumatic mitral valve disorder, unspecified: Secondary | ICD-10-CM | POA: Diagnosis not present

## 2015-10-06 DIAGNOSIS — I6350 Cerebral infarction due to unspecified occlusion or stenosis of unspecified cerebral artery: Secondary | ICD-10-CM | POA: Diagnosis not present

## 2015-10-06 DIAGNOSIS — Z7901 Long term (current) use of anticoagulants: Secondary | ICD-10-CM | POA: Diagnosis not present

## 2015-10-06 DIAGNOSIS — I340 Nonrheumatic mitral (valve) insufficiency: Secondary | ICD-10-CM | POA: Diagnosis not present

## 2015-10-13 DIAGNOSIS — Z7901 Long term (current) use of anticoagulants: Secondary | ICD-10-CM | POA: Diagnosis not present

## 2015-10-13 DIAGNOSIS — I6350 Cerebral infarction due to unspecified occlusion or stenosis of unspecified cerebral artery: Secondary | ICD-10-CM | POA: Diagnosis not present

## 2015-10-13 DIAGNOSIS — I340 Nonrheumatic mitral (valve) insufficiency: Secondary | ICD-10-CM | POA: Diagnosis not present

## 2015-10-14 DIAGNOSIS — E119 Type 2 diabetes mellitus without complications: Secondary | ICD-10-CM | POA: Insufficient documentation

## 2015-10-14 DIAGNOSIS — I69359 Hemiplegia and hemiparesis following cerebral infarction affecting unspecified side: Secondary | ICD-10-CM | POA: Insufficient documentation

## 2015-10-16 ENCOUNTER — Ambulatory Visit (INDEPENDENT_AMBULATORY_CARE_PROVIDER_SITE_OTHER): Payer: Medicare Other | Admitting: Cardiovascular Disease

## 2015-10-16 ENCOUNTER — Encounter: Payer: Self-pay | Admitting: Cardiovascular Disease

## 2015-10-16 VITALS — BP 110/68 | HR 74 | Ht 66.0 in | Wt 128.8 lb

## 2015-10-16 DIAGNOSIS — I10 Essential (primary) hypertension: Secondary | ICD-10-CM | POA: Diagnosis not present

## 2015-10-16 DIAGNOSIS — Z952 Presence of prosthetic heart valve: Secondary | ICD-10-CM | POA: Insufficient documentation

## 2015-10-16 DIAGNOSIS — Z954 Presence of other heart-valve replacement: Secondary | ICD-10-CM | POA: Diagnosis not present

## 2015-10-16 NOTE — Patient Instructions (Signed)
Medication Instructions:  Your physician recommends that you continue on your current medications as directed. Please refer to the Current Medication list given to you today.  Labwork: None  Testing/Procedures: None  Follow-Up: Your physician wants you to follow-up in: 6 months with Dr. Nahser.  You will receive a reminder letter in the mail two months in advance. If you don't receive a letter, please call our office to schedule the follow-up appointment.   Any Other Special Instructions Will Be Listed Below (If Applicable).     If you need a refill on your cardiac medications before your next appointment, please call your pharmacy.   

## 2015-10-16 NOTE — Progress Notes (Signed)
Cardiology Office Note   Date:  10/16/2015   ID:  [name], DOB [birth year], MRN [id number]  PCP:  Haywood Pao, MD  Cardiologist:   Acie Fredrickson Wonda Cheng, MD   Chief Complaint  Patient presents with  . Mitral valve replacement   Problem List 1. Mitral valve replacement  - at age 69.  2. Essential HTN 3. Hyperlipidemia 4. DM.    History of Present Illness: [name] is a 69 y.o. female who presents for management of her mitral valve replacement . Was seen with her husband [name].  Moved from [place], [place] recently  Has had 4 strokes and a TIA.  She does her own INR monitoring  - the strokes were not associated with marked INR variations Exercises some, not as much as when she lived in [place].   Does water aerobics.  Walks on occasion .  Also does physical therapy exercises.     Past Medical History  Diagnosis Date  . Allergy   . Diabetes mellitus without complication ([place])   . Stroke [facility])     Past Surgical History  Procedure Laterality Date  . Brain surgery    . Abdominal hysterectomy    . Cardiac valve replacement       Current Outpatient Prescriptions  Medication Sig Dispense Refill  . AMLODIPINE BESYLATE PO Take 10 mg by mouth daily.    Marland Kitchen amoxicillin (AMOXIL) 875 MG tablet Take 1 tablet (875 mg total) by mouth 2 (two) times daily. 20 tablet 0  . atorvastatin (LIPITOR) 10 MG tablet Take 10 mg by mouth daily.    . benzonatate (TESSALON) 100 MG capsule Take 100 mg by mouth as needed for cough.    . calcium carbonate 1250 MG capsule Take 1,200 mg by mouth 2 (two) times daily with a meal.    . cholecalciferol (VITAMIN D) 1000 UNITS tablet Take 2,000 Units by mouth daily.    Marland Kitchen denosumab (PROLIA) 60 MG/ML SOLN injection Inject 60 mg into the skin every 6 (six) months. Administer in upper arm, thigh, or abdomen    . docusate sodium (COLACE) 100 MG capsule Take 100 mg by mouth 2 (two) times daily.    . metoprolol succinate (TOPROL-XL) 12.5 mg TB24 24 hr tablet  Take 12.5 mg by mouth daily.    Marland Kitchen OMEPRAZOLE PO Take 20 mg by mouth daily.    Marland Kitchen warfarin (COUMADIN) 5 MG tablet Take 5 mg by mouth daily.     No current facility-administered medications for this visit.    Allergies:   Zoloft    Social History:  The patient  reports that she has never smoked. She does not have any smokeless tobacco history on file.   Family History:  The patient's family history includes Cancer in her mother; Heart disease in her mother; Hyperlipidemia in her mother; Stroke in her father.    ROS:  Please see the history of present illness.    Review of Systems: Constitutional:  denies fever, chills, diaphoresis, appetite change and fatigue.  HEENT: denies photophobia, eye pain, redness, hearing loss, ear pain, congestion, sore throat, rhinorrhea, sneezing, neck pain, neck stiffness and tinnitus.  Respiratory: denies SOB, DOE, cough, chest tightness, and wheezing.  Cardiovascular: denies chest pain, palpitations and leg swelling.  Gastrointestinal: denies nausea, vomiting, abdominal pain, diarrhea, constipation, blood in stool.  Genitourinary: denies dysuria, urgency, frequency, hematuria, flank pain and difficulty urinating.  Musculoskeletal: denies  myalgias, back pain, joint swelling, arthralgias and gait problem.   Skin:  denies pallor, rash and wound.  Neurological: denies dizziness, seizures, syncope, weakness, light-headedness, numbness and headaches.   Hematological: denies adenopathy, easy bruising, personal or family bleeding history.  Psychiatric/ Behavioral: denies suicidal ideation, mood changes, confusion, nervousness, sleep disturbance and agitation.       All other systems are reviewed and negative.    PHYSICAL EXAM: VS:  BP 110/68 mmHg  Pulse 74  Ht 5\' 6"  (1.676 m)  Wt 128 lb 12.8 oz (58.423 kg)  BMI 20.80 kg/m2 , BMI Body mass index is 20.8 kg/(m^2). GEN: Well nourished, well developed, in no acute distress HEENT: normal Neck: no JVD,  carotid bruits, or masses Cardiac: RRR; no murmurs, rubs, or gallops,no edema  Respiratory:  clear to auscultation bilaterally, normal work of breathing GI: soft, nontender, nondistended, + BS MS: no deformity or atrophy Skin: warm and dry, no rash Neuro:  Strength and sensation are intact Psych: normal  he abdomen normal saline every every day recommendations everywhere  EKG:  EKG is ordered today. The ekg ordered today demonstrates NSR at 74 with 1st degree AV block with PVCs  Right superior axis  prior   Recent Labs: No results found for requested labs within last 365 days.    Lipid Panel No results found for: CHOL, TRIG, HDL, CHOLHDL, VLDL, LDLCALC, LDLDIRECT    Wt Readings from Last 3 Encounters:  10/16/15 128 lb 12.8 oz (58.423 kg)  07/03/15 127 lb 14.4 oz (58.015 kg)      Other studies Reviewed: Additional studies/ records that were reviewed today include:  Review of the above records demonstrates:    ASSESSMENT AND PLAN:  1.   Mitral valve replacement: [name] presents for follow-up of mitral valve placement. She had mechanical mitral valve placed at age 3. She monitors her INR levels at home. She has had a history of strokes.  continue current medications.   2. Hypertension: Continue Amlodipine 10 mg a day .   BP is stable      Current medicines are reviewed at length with the patient today.  The patient does not have concerns regarding medicines.  The following changes have been made:  no change  Labs/ tests ordered today include:  No orders of the defined types were placed in this encounter.     Disposition:   FU with me in 6 months      [name], Wonda Cheng, MD  10/16/2015 3:58 PM    [facility] [place], [place], [place]  [address] Phone: [phone]; Fax: [phone]   [facility]  [address] [place], [place]  [address] [phone]   Fax [phone]

## 2015-10-20 DIAGNOSIS — I340 Nonrheumatic mitral (valve) insufficiency: Secondary | ICD-10-CM | POA: Diagnosis not present

## 2015-10-20 DIAGNOSIS — I6350 Cerebral infarction due to unspecified occlusion or stenosis of unspecified cerebral artery: Secondary | ICD-10-CM | POA: Diagnosis not present

## 2015-10-20 DIAGNOSIS — Z7901 Long term (current) use of anticoagulants: Secondary | ICD-10-CM | POA: Diagnosis not present

## 2015-10-27 DIAGNOSIS — Z952 Presence of prosthetic heart valve: Secondary | ICD-10-CM | POA: Diagnosis not present

## 2015-11-03 LAB — POCT INR: INR: 3.6

## 2015-11-05 ENCOUNTER — Ambulatory Visit (INDEPENDENT_AMBULATORY_CARE_PROVIDER_SITE_OTHER): Payer: Medicare Other | Admitting: Pharmacist

## 2015-11-05 ENCOUNTER — Telehealth: Payer: Self-pay | Admitting: Cardiovascular Disease

## 2015-11-05 DIAGNOSIS — Z952 Presence of prosthetic heart valve: Secondary | ICD-10-CM

## 2015-11-05 DIAGNOSIS — Z954 Presence of other heart-valve replacement: Secondary | ICD-10-CM

## 2015-11-05 NOTE — Telephone Encounter (Signed)
New Message  Pt called request a call back to discuss ptinr results from home health and to discuss how to move forward.

## 2015-11-05 NOTE — Telephone Encounter (Signed)
Spoke with pt.  She checked her INR on Monday and has not heard anything back.  We completed paperwork to have self-testing results sent to [place] last week.  She states she spoke to a female at the company and everything was in process.  INR on Monday was 3.6.  She takes Coumadin 7.5mg  Tues, Sat and 5mg  other days.  She took 5mg  yesterday rather than 7.5mg .  Told her this was correct and we will plan on her rechecking her INR in 2 weeks.

## 2015-11-17 LAB — POCT INR: INR: 3

## 2015-11-21 ENCOUNTER — Ambulatory Visit (INDEPENDENT_AMBULATORY_CARE_PROVIDER_SITE_OTHER): Payer: Medicare Other | Admitting: Internal Medicine

## 2015-11-21 DIAGNOSIS — Z952 Presence of prosthetic heart valve: Secondary | ICD-10-CM

## 2015-11-21 DIAGNOSIS — Z954 Presence of other heart-valve replacement: Secondary | ICD-10-CM

## 2015-12-01 DIAGNOSIS — Z952 Presence of prosthetic heart valve: Secondary | ICD-10-CM | POA: Diagnosis not present

## 2015-12-01 LAB — POCT INR: INR: 3.5

## 2015-12-05 ENCOUNTER — Telehealth: Payer: Self-pay | Admitting: *Deleted

## 2015-12-05 ENCOUNTER — Ambulatory Visit (INDEPENDENT_AMBULATORY_CARE_PROVIDER_SITE_OTHER): Payer: Medicare Other | Admitting: Cardiovascular Disease

## 2015-12-05 DIAGNOSIS — Z952 Presence of prosthetic heart valve: Secondary | ICD-10-CM

## 2015-12-05 DIAGNOSIS — Z954 Presence of other heart-valve replacement: Secondary | ICD-10-CM

## 2015-12-05 NOTE — Telephone Encounter (Signed)
Spoke with [name] at [facility] [place] due to the patient has been completing her INR checks and we have not been receiving the reports.  She stated that they still have the information from [place]. Therefore, I provided her with the updated Cardiology info & she stated that it would be a 1 week turn around for [place] to automatically receive the info.  Also, I called the patient & husband to inform them of the process & they verbalized understanding.

## 2015-12-13 DIAGNOSIS — H4311 Vitreous hemorrhage, right eye: Secondary | ICD-10-CM | POA: Diagnosis not present

## 2015-12-15 ENCOUNTER — Ambulatory Visit (INDEPENDENT_AMBULATORY_CARE_PROVIDER_SITE_OTHER): Payer: Medicare Other | Admitting: Cardiovascular Disease

## 2015-12-15 DIAGNOSIS — Z952 Presence of prosthetic heart valve: Secondary | ICD-10-CM

## 2015-12-15 DIAGNOSIS — Z954 Presence of other heart-valve replacement: Secondary | ICD-10-CM

## 2015-12-15 LAB — POCT INR: INR: 2.6

## 2015-12-16 DIAGNOSIS — H43811 Vitreous degeneration, right eye: Secondary | ICD-10-CM | POA: Diagnosis not present

## 2015-12-16 DIAGNOSIS — H4311 Vitreous hemorrhage, right eye: Secondary | ICD-10-CM | POA: Diagnosis not present

## 2015-12-29 ENCOUNTER — Ambulatory Visit (INDEPENDENT_AMBULATORY_CARE_PROVIDER_SITE_OTHER): Payer: Medicare Other | Admitting: Cardiology

## 2015-12-29 DIAGNOSIS — Z952 Presence of prosthetic heart valve: Secondary | ICD-10-CM | POA: Diagnosis not present

## 2015-12-29 DIAGNOSIS — Z954 Presence of other heart-valve replacement: Secondary | ICD-10-CM

## 2015-12-29 LAB — POCT INR: INR: 3.3

## 2016-01-02 ENCOUNTER — Telehealth: Payer: Self-pay | Admitting: Cardiovascular Disease

## 2016-01-02 NOTE — Telephone Encounter (Signed)
Release mailed to patient home address.  °

## 2016-01-12 ENCOUNTER — Ambulatory Visit (INDEPENDENT_AMBULATORY_CARE_PROVIDER_SITE_OTHER): Payer: Medicare Other

## 2016-01-12 DIAGNOSIS — Z954 Presence of other heart-valve replacement: Secondary | ICD-10-CM

## 2016-01-12 DIAGNOSIS — Z952 Presence of prosthetic heart valve: Secondary | ICD-10-CM

## 2016-01-12 LAB — POCT INR: INR: 2.7

## 2016-01-26 ENCOUNTER — Ambulatory Visit (INDEPENDENT_AMBULATORY_CARE_PROVIDER_SITE_OTHER): Payer: Medicare Other | Admitting: Cardiovascular Disease

## 2016-01-26 DIAGNOSIS — I6350 Cerebral infarction due to unspecified occlusion or stenosis of unspecified cerebral artery: Secondary | ICD-10-CM | POA: Diagnosis not present

## 2016-01-26 DIAGNOSIS — Z954 Presence of other heart-valve replacement: Secondary | ICD-10-CM

## 2016-01-26 DIAGNOSIS — I340 Nonrheumatic mitral (valve) insufficiency: Secondary | ICD-10-CM | POA: Diagnosis not present

## 2016-01-26 DIAGNOSIS — Z7901 Long term (current) use of anticoagulants: Secondary | ICD-10-CM | POA: Diagnosis not present

## 2016-01-26 DIAGNOSIS — Z952 Presence of prosthetic heart valve: Secondary | ICD-10-CM | POA: Diagnosis not present

## 2016-01-26 LAB — POCT INR: INR: 3.2

## 2016-01-27 DIAGNOSIS — H4311 Vitreous hemorrhage, right eye: Secondary | ICD-10-CM | POA: Diagnosis not present

## 2016-02-09 ENCOUNTER — Ambulatory Visit (INDEPENDENT_AMBULATORY_CARE_PROVIDER_SITE_OTHER): Payer: Medicare Other

## 2016-02-09 DIAGNOSIS — Z954 Presence of other heart-valve replacement: Secondary | ICD-10-CM

## 2016-02-09 DIAGNOSIS — Z952 Presence of prosthetic heart valve: Secondary | ICD-10-CM

## 2016-02-09 LAB — POCT INR: INR: 3.3

## 2016-02-23 ENCOUNTER — Ambulatory Visit (INDEPENDENT_AMBULATORY_CARE_PROVIDER_SITE_OTHER): Payer: Medicare Other | Admitting: Cardiovascular Disease

## 2016-02-23 DIAGNOSIS — Z7901 Long term (current) use of anticoagulants: Secondary | ICD-10-CM | POA: Diagnosis not present

## 2016-02-23 DIAGNOSIS — Z888 Allergy status to other drugs, medicaments and biological substances status: Secondary | ICD-10-CM | POA: Diagnosis not present

## 2016-02-23 DIAGNOSIS — E785 Hyperlipidemia, unspecified: Secondary | ICD-10-CM | POA: Diagnosis not present

## 2016-02-23 DIAGNOSIS — I10 Essential (primary) hypertension: Secondary | ICD-10-CM | POA: Diagnosis not present

## 2016-02-23 DIAGNOSIS — R531 Weakness: Secondary | ICD-10-CM | POA: Diagnosis not present

## 2016-02-23 DIAGNOSIS — Z954 Presence of other heart-valve replacement: Secondary | ICD-10-CM

## 2016-02-23 DIAGNOSIS — Z8673 Personal history of transient ischemic attack (TIA), and cerebral infarction without residual deficits: Secondary | ICD-10-CM | POA: Diagnosis not present

## 2016-02-23 DIAGNOSIS — Z79899 Other long term (current) drug therapy: Secondary | ICD-10-CM | POA: Diagnosis not present

## 2016-02-23 DIAGNOSIS — Z952 Presence of prosthetic heart valve: Secondary | ICD-10-CM | POA: Diagnosis not present

## 2016-02-23 DIAGNOSIS — I6340 Cerebral infarction due to embolism of unspecified cerebral artery: Secondary | ICD-10-CM | POA: Diagnosis not present

## 2016-02-23 DIAGNOSIS — Z7982 Long term (current) use of aspirin: Secondary | ICD-10-CM | POA: Diagnosis not present

## 2016-02-23 LAB — POCT INR: INR: 4.2

## 2016-02-24 DIAGNOSIS — Z952 Presence of prosthetic heart valve: Secondary | ICD-10-CM | POA: Diagnosis not present

## 2016-02-24 DIAGNOSIS — I679 Cerebrovascular disease, unspecified: Secondary | ICD-10-CM | POA: Diagnosis not present

## 2016-02-24 DIAGNOSIS — I69998 Other sequelae following unspecified cerebrovascular disease: Secondary | ICD-10-CM | POA: Diagnosis not present

## 2016-02-24 DIAGNOSIS — R7301 Impaired fasting glucose: Secondary | ICD-10-CM | POA: Diagnosis not present

## 2016-02-24 DIAGNOSIS — E7800 Pure hypercholesterolemia, unspecified: Secondary | ICD-10-CM | POA: Diagnosis not present

## 2016-02-24 DIAGNOSIS — M810 Age-related osteoporosis without current pathological fracture: Secondary | ICD-10-CM | POA: Diagnosis not present

## 2016-02-24 DIAGNOSIS — I10 Essential (primary) hypertension: Secondary | ICD-10-CM | POA: Diagnosis not present

## 2016-03-02 DIAGNOSIS — Z7901 Long term (current) use of anticoagulants: Secondary | ICD-10-CM | POA: Diagnosis not present

## 2016-03-02 DIAGNOSIS — I679 Cerebrovascular disease, unspecified: Secondary | ICD-10-CM | POA: Diagnosis not present

## 2016-03-02 DIAGNOSIS — M810 Age-related osteoporosis without current pathological fracture: Secondary | ICD-10-CM | POA: Diagnosis not present

## 2016-03-02 DIAGNOSIS — I69998 Other sequelae following unspecified cerebrovascular disease: Secondary | ICD-10-CM | POA: Diagnosis not present

## 2016-03-02 DIAGNOSIS — I10 Essential (primary) hypertension: Secondary | ICD-10-CM | POA: Diagnosis not present

## 2016-03-02 DIAGNOSIS — Z1389 Encounter for screening for other disorder: Secondary | ICD-10-CM | POA: Diagnosis not present

## 2016-03-02 DIAGNOSIS — Z0000 Encounter for general adult medical examination without abnormal findings: Secondary | ICD-10-CM | POA: Diagnosis not present

## 2016-03-02 DIAGNOSIS — E7800 Pure hypercholesterolemia, unspecified: Secondary | ICD-10-CM | POA: Diagnosis not present

## 2016-03-02 DIAGNOSIS — D692 Other nonthrombocytopenic purpura: Secondary | ICD-10-CM | POA: Diagnosis not present

## 2016-03-02 DIAGNOSIS — Z6820 Body mass index (BMI) 20.0-20.9, adult: Secondary | ICD-10-CM | POA: Diagnosis not present

## 2016-03-02 DIAGNOSIS — L03039 Cellulitis of unspecified toe: Secondary | ICD-10-CM | POA: Diagnosis not present

## 2016-03-02 DIAGNOSIS — K219 Gastro-esophageal reflux disease without esophagitis: Secondary | ICD-10-CM | POA: Diagnosis not present

## 2016-03-08 ENCOUNTER — Ambulatory Visit (INDEPENDENT_AMBULATORY_CARE_PROVIDER_SITE_OTHER): Payer: Medicare Other | Admitting: Internal Medicine

## 2016-03-08 DIAGNOSIS — Z952 Presence of prosthetic heart valve: Secondary | ICD-10-CM

## 2016-03-08 DIAGNOSIS — Z954 Presence of other heart-valve replacement: Secondary | ICD-10-CM

## 2016-03-08 DIAGNOSIS — Z1212 Encounter for screening for malignant neoplasm of rectum: Secondary | ICD-10-CM | POA: Diagnosis not present

## 2016-03-08 LAB — POCT INR: INR: 3.3

## 2016-03-15 DIAGNOSIS — R310 Gross hematuria: Secondary | ICD-10-CM | POA: Diagnosis not present

## 2016-03-15 DIAGNOSIS — Z87442 Personal history of urinary calculi: Secondary | ICD-10-CM | POA: Diagnosis not present

## 2016-03-15 DIAGNOSIS — Z0000 Encounter for general adult medical examination without abnormal findings: Secondary | ICD-10-CM | POA: Diagnosis not present

## 2016-03-16 DIAGNOSIS — H4311 Vitreous hemorrhage, right eye: Secondary | ICD-10-CM | POA: Diagnosis not present

## 2016-03-16 DIAGNOSIS — H43811 Vitreous degeneration, right eye: Secondary | ICD-10-CM | POA: Diagnosis not present

## 2016-03-19 DIAGNOSIS — H25013 Cortical age-related cataract, bilateral: Secondary | ICD-10-CM | POA: Diagnosis not present

## 2016-03-19 DIAGNOSIS — Z0100 Encounter for examination of eyes and vision without abnormal findings: Secondary | ICD-10-CM | POA: Diagnosis not present

## 2016-03-19 DIAGNOSIS — H43811 Vitreous degeneration, right eye: Secondary | ICD-10-CM | POA: Diagnosis not present

## 2016-03-22 ENCOUNTER — Ambulatory Visit (INDEPENDENT_AMBULATORY_CARE_PROVIDER_SITE_OTHER): Payer: Medicare Other

## 2016-03-22 DIAGNOSIS — Z952 Presence of prosthetic heart valve: Secondary | ICD-10-CM

## 2016-03-22 DIAGNOSIS — Z954 Presence of other heart-valve replacement: Secondary | ICD-10-CM

## 2016-03-22 LAB — POCT INR: INR: 5.6

## 2016-03-25 DIAGNOSIS — N200 Calculus of kidney: Secondary | ICD-10-CM | POA: Diagnosis not present

## 2016-03-25 DIAGNOSIS — R310 Gross hematuria: Secondary | ICD-10-CM | POA: Diagnosis not present

## 2016-03-29 ENCOUNTER — Other Ambulatory Visit (HOSPITAL_COMMUNITY): Payer: Self-pay | Admitting: *Deleted

## 2016-03-29 ENCOUNTER — Ambulatory Visit (INDEPENDENT_AMBULATORY_CARE_PROVIDER_SITE_OTHER): Payer: Medicare Other | Admitting: Cardiology

## 2016-03-29 DIAGNOSIS — B9620 Unspecified Escherichia coli [E. coli] as the cause of diseases classified elsewhere: Secondary | ICD-10-CM | POA: Diagnosis not present

## 2016-03-29 DIAGNOSIS — N200 Calculus of kidney: Secondary | ICD-10-CM | POA: Diagnosis not present

## 2016-03-29 DIAGNOSIS — R310 Gross hematuria: Secondary | ICD-10-CM | POA: Diagnosis not present

## 2016-03-29 DIAGNOSIS — Z954 Presence of other heart-valve replacement: Secondary | ICD-10-CM

## 2016-03-29 DIAGNOSIS — N289 Disorder of kidney and ureter, unspecified: Secondary | ICD-10-CM | POA: Diagnosis not present

## 2016-03-29 DIAGNOSIS — N390 Urinary tract infection, site not specified: Secondary | ICD-10-CM | POA: Diagnosis not present

## 2016-03-29 DIAGNOSIS — Z0000 Encounter for general adult medical examination without abnormal findings: Secondary | ICD-10-CM | POA: Diagnosis not present

## 2016-03-29 DIAGNOSIS — Z952 Presence of prosthetic heart valve: Secondary | ICD-10-CM

## 2016-03-29 LAB — POCT INR: INR: 1.9

## 2016-03-30 ENCOUNTER — Inpatient Hospital Stay (HOSPITAL_COMMUNITY): Admission: RE | Admit: 2016-03-30 | Payer: Medicare Other | Source: Ambulatory Visit

## 2016-04-05 LAB — POCT INR: INR: 3.3

## 2016-04-06 ENCOUNTER — Ambulatory Visit (INDEPENDENT_AMBULATORY_CARE_PROVIDER_SITE_OTHER): Payer: Medicare Other | Admitting: Cardiology

## 2016-04-06 DIAGNOSIS — Z954 Presence of other heart-valve replacement: Secondary | ICD-10-CM

## 2016-04-06 DIAGNOSIS — Z952 Presence of prosthetic heart valve: Secondary | ICD-10-CM

## 2016-04-07 ENCOUNTER — Ambulatory Visit (HOSPITAL_COMMUNITY)
Admission: RE | Admit: 2016-04-07 | Discharge: 2016-04-07 | Disposition: A | Discharge status: Home / Self Care | Payer: Medicare Other | Source: Ambulatory Visit | Attending: Internal Medicine | Admitting: Internal Medicine

## 2016-04-07 DIAGNOSIS — M810 Age-related osteoporosis without current pathological fracture: Secondary | ICD-10-CM | POA: Diagnosis not present

## 2016-04-07 MED ORDER — DENOSUMAB 60 MG/ML ~~LOC~~ SOLN
60.0000 mg | Freq: Once | SUBCUTANEOUS | Status: AC
  Administered 2016-04-07: 60 mg via SUBCUTANEOUS
  Filled 2016-04-07: qty 1

## 2016-04-13 ENCOUNTER — Encounter: Payer: Self-pay | Admitting: Cardiovascular Disease

## 2016-04-13 ENCOUNTER — Encounter: Payer: Self-pay | Admitting: *Deleted

## 2016-04-13 ENCOUNTER — Ambulatory Visit (INDEPENDENT_AMBULATORY_CARE_PROVIDER_SITE_OTHER): Payer: Medicare Other | Admitting: Cardiovascular Disease

## 2016-04-13 VITALS — BP 110/70 | HR 68 | Ht 66.0 in | Wt 130.0 lb

## 2016-04-13 DIAGNOSIS — I10 Essential (primary) hypertension: Secondary | ICD-10-CM | POA: Insufficient documentation

## 2016-04-13 DIAGNOSIS — Z952 Presence of prosthetic heart valve: Secondary | ICD-10-CM

## 2016-04-13 DIAGNOSIS — Z954 Presence of other heart-valve replacement: Secondary | ICD-10-CM | POA: Diagnosis not present

## 2016-04-13 NOTE — Patient Instructions (Signed)
Medication Instructions:  Your physician recommends that you continue on your current medications as directed. Please refer to the Current Medication list given to you today.   Labwork: None  Testing/Procedures: Your physician has requested that you have an echocardiogram. Echocardiography is a painless test that uses sound waves to create images of your heart. It provides your doctor with information about the size and shape of your heart and how well your heart's chambers and valves are working. This procedure takes approximately one hour. There are no restrictions for this procedure.    Follow-Up: Your physician wants you to follow-up in: 6 months with Dr Acie Fredrickson. (December 2017).  You will receive a reminder letter in the mail two months in advance. If you don't receive a letter, please call our office to schedule the follow-up appointment.       If you need a refill on your cardiac medications before your next appointment, please call your pharmacy.

## 2016-04-13 NOTE — Progress Notes (Signed)
Cardiology Office Note   Date:  04/13/2016   ID:  [name], DOB 1946/03/29, MRN [id number]  PCP:  Haywood Pao, MD  Cardiologist:   Mertie Moores, MD   Chief Complaint  Patient presents with  . Hypertension   Problem List 1. Mitral valve replacement  - at age 70.  2. Essential HTN 3. Hyperlipidemia 4. DM.    History of Present Illness: [name] is a 70 y.o. female who presents for management of her mitral valve replacement . Was seen with her husband [name].  Moved from [place], [place] recently  Has had 4 strokes and a TIA.  She does her own INR monitoring  - the strokes were not associated with marked INR variations Exercises some, not as much as when she lived in [place].   Does water aerobics.  Walks on occasion .  Also does physical therapy exercises.   April 13, 2016:  Doing well.  INRs have been OK. ( 3.2)   She measures her own.  Last week the INR levels were off when she was on Abx for a UTI .   Past Medical History  Diagnosis Date  . Allergy   . Diabetes mellitus without complication ([place])   . Stroke [facility])     Past Surgical History  Procedure Laterality Date  . Brain surgery    . Abdominal hysterectomy    . Cardiac valve replacement       Current Outpatient Prescriptions  Medication Sig Dispense Refill  . AMLODIPINE BESYLATE PO Take 10 mg by mouth daily.    Marland Kitchen aspirin EC 81 MG tablet Take 81 mg by mouth daily.     Marland Kitchen atorvastatin (LIPITOR) 10 MG tablet Take 10 mg by mouth daily.    . benzonatate (TESSALON) 100 MG capsule Take 100 mg by mouth as needed for cough.    . calcium carbonate 1250 MG capsule Take 1,200 mg by mouth 2 (two) times daily with a meal.    . cholecalciferol (VITAMIN D) 1000 UNITS tablet Take 2,000 Units by mouth daily.    Marland Kitchen denosumab (PROLIA) 60 MG/ML SOLN injection Inject 60 mg into the skin every 6 (six) months. Administer in upper arm, thigh, or abdomen    . docusate sodium (COLACE) 100 MG capsule Take 100 mg by mouth  daily.     Marland Kitchen OMEPRAZOLE PO Take 20 mg by mouth daily.    Marland Kitchen warfarin (COUMADIN) 5 MG tablet Take 5 mg by mouth as directed.      No current facility-administered medications for this visit.    Allergies:   Zoloft    Social History:  The patient  reports that she has never smoked. She does not have any smokeless tobacco history on file.   Family History:  The patient's family history includes Cancer in her mother; Heart disease in her mother; Hyperlipidemia in her mother; Hypertension in her father and mother; Stroke in her father. There is no history of Heart attack.    ROS:  Please see the history of present illness.    Review of Systems: Constitutional:  denies fever, chills, diaphoresis, appetite change and fatigue.  HEENT: denies photophobia, eye pain, redness, hearing loss, ear pain, congestion, sore throat, rhinorrhea, sneezing, neck pain, neck stiffness and tinnitus.  Respiratory: denies SOB, DOE, cough, chest tightness, and wheezing.  Cardiovascular: denies chest pain, palpitations and leg swelling.  Gastrointestinal: denies nausea, vomiting, abdominal pain, diarrhea, constipation, blood in stool.  Genitourinary: denies dysuria, urgency, frequency, hematuria,  flank pain and difficulty urinating.  Musculoskeletal: denies  myalgias, back pain, joint swelling, arthralgias and gait problem.   Skin: denies pallor, rash and wound.  Neurological: denies dizziness, seizures, syncope, weakness, light-headedness, numbness and headaches.   Hematological: denies adenopathy, easy bruising, personal or family bleeding history.  Psychiatric/ Behavioral: denies suicidal ideation, mood changes, confusion, nervousness, sleep disturbance and agitation.       All other systems are reviewed and negative.    PHYSICAL EXAM: VS:  BP 110/70 mmHg  Pulse 68  Ht 5\' 6"  (1.676 m)  Wt 130 lb (58.968 kg)  BMI 20.99 kg/m2 , BMI Body mass index is 20.99 kg/(m^2). GEN: Well nourished, well developed,  in no acute distress HEENT: normal Neck: no JVD, carotid bruits, or masses Cardiac: RRR; no murmurs, rubs, or gallops,no edema  Respiratory:  clear to auscultation bilaterally, normal work of breathing GI: soft, nontender, nondistended, + BS MS: no deformity or atrophy Skin: warm and dry, no rash Neuro:  Strength and sensation are intact Psych: normal  he abdomen normal saline every every day recommendations everywhere  EKG:  EKG is ordered today. The ekg ordered today demonstrates NSR at 68  with 1st degree AV block   Poor R wave progression     Recent Labs: No results found for requested labs within last 365 days.    Lipid Panel No results found for: CHOL, TRIG, HDL, CHOLHDL, VLDL, LDLCALC, LDLDIRECT    Wt Readings from Last 3 Encounters:  04/13/16 130 lb (58.968 kg)  10/16/15 128 lb 12.8 oz (58.423 kg)  07/03/15 127 lb 14.4 oz (58.015 kg)      Other studies Reviewed: Additional studies/ records that were reviewed today include:  Review of the above records demonstrates:    ASSESSMENT AND PLAN:  1.   Mitral valve replacement: [name] presents for follow-up of mitral valve placement. She had mechanical mitral valve placed at age 20. She monitors her INR levels at home. She has had a history of strokes.  She is on ASA 81 mg a day  Will get an echo - we do not have a baseline echo .   Previous echos were done in [place]   continue current medications.   2. Hypertension: Continue Amlodipine 10 mg a day .   BP is stable      Current medicines are reviewed at length with the patient today.  The patient does not have concerns regarding medicines.  The following changes have been made:  no change  Labs/ tests ordered today include:   Orders Placed This Encounter  Procedures  . EKG 12-Lead     Disposition:   FU with me in 6 months      Mertie Moores, MD  04/13/2016 10:08 AM    [facility] [place], [place], [place]   [address] Phone: [phone]; Fax: [phone]   [facility]  [address] [place],   [address] [phone]   Fax [phone]

## 2016-04-19 ENCOUNTER — Ambulatory Visit (INDEPENDENT_AMBULATORY_CARE_PROVIDER_SITE_OTHER): Payer: Medicare Other | Admitting: Pharmacist

## 2016-04-19 DIAGNOSIS — Z952 Presence of prosthetic heart valve: Secondary | ICD-10-CM | POA: Diagnosis not present

## 2016-04-19 DIAGNOSIS — I63519 Cerebral infarction due to unspecified occlusion or stenosis of unspecified middle cerebral artery: Secondary | ICD-10-CM

## 2016-04-19 DIAGNOSIS — Z954 Presence of other heart-valve replacement: Secondary | ICD-10-CM

## 2016-04-19 LAB — POCT INR: INR: 3.5

## 2016-04-20 DIAGNOSIS — N281 Cyst of kidney, acquired: Secondary | ICD-10-CM | POA: Diagnosis not present

## 2016-04-20 DIAGNOSIS — N289 Disorder of kidney and ureter, unspecified: Secondary | ICD-10-CM | POA: Diagnosis not present

## 2016-04-20 DIAGNOSIS — R310 Gross hematuria: Secondary | ICD-10-CM | POA: Diagnosis not present

## 2016-04-20 DIAGNOSIS — N200 Calculus of kidney: Secondary | ICD-10-CM | POA: Diagnosis not present

## 2016-04-21 DIAGNOSIS — D485 Neoplasm of uncertain behavior of skin: Secondary | ICD-10-CM | POA: Diagnosis not present

## 2016-04-21 DIAGNOSIS — L821 Other seborrheic keratosis: Secondary | ICD-10-CM | POA: Diagnosis not present

## 2016-04-21 DIAGNOSIS — D225 Melanocytic nevi of trunk: Secondary | ICD-10-CM | POA: Diagnosis not present

## 2016-04-21 DIAGNOSIS — L820 Inflamed seborrheic keratosis: Secondary | ICD-10-CM | POA: Diagnosis not present

## 2016-04-26 ENCOUNTER — Telehealth: Payer: Self-pay | Admitting: *Deleted

## 2016-04-26 NOTE — Telephone Encounter (Signed)
Patient to take amoxicillin pre dental procedure, informed her does not interfere with coumadin.

## 2016-05-03 ENCOUNTER — Ambulatory Visit (INDEPENDENT_AMBULATORY_CARE_PROVIDER_SITE_OTHER): Payer: Medicare Other | Admitting: Internal Medicine

## 2016-05-03 DIAGNOSIS — I63519 Cerebral infarction due to unspecified occlusion or stenosis of unspecified middle cerebral artery: Secondary | ICD-10-CM

## 2016-05-03 DIAGNOSIS — Z952 Presence of prosthetic heart valve: Secondary | ICD-10-CM

## 2016-05-03 DIAGNOSIS — Z954 Presence of other heart-valve replacement: Secondary | ICD-10-CM

## 2016-05-03 LAB — POCT INR: INR: 3

## 2016-05-04 DIAGNOSIS — R350 Frequency of micturition: Secondary | ICD-10-CM | POA: Diagnosis not present

## 2016-05-10 ENCOUNTER — Encounter: Payer: Self-pay | Admitting: Internal Medicine

## 2016-05-10 ENCOUNTER — Ambulatory Visit (INDEPENDENT_AMBULATORY_CARE_PROVIDER_SITE_OTHER): Payer: Medicare Other | Admitting: Internal Medicine

## 2016-05-10 DIAGNOSIS — Z954 Presence of other heart-valve replacement: Secondary | ICD-10-CM

## 2016-05-10 DIAGNOSIS — Z952 Presence of prosthetic heart valve: Secondary | ICD-10-CM

## 2016-05-10 DIAGNOSIS — I63519 Cerebral infarction due to unspecified occlusion or stenosis of unspecified middle cerebral artery: Secondary | ICD-10-CM

## 2016-05-10 LAB — POCT INR: INR: 6.7

## 2016-05-10 NOTE — Telephone Encounter (Signed)
This encounter was created in error - please disregard.

## 2016-05-12 ENCOUNTER — Other Ambulatory Visit: Payer: Self-pay

## 2016-05-12 ENCOUNTER — Ambulatory Visit (HOSPITAL_COMMUNITY): Payer: Medicare Other | Attending: Cardiovascular Disease

## 2016-05-12 DIAGNOSIS — I10 Essential (primary) hypertension: Secondary | ICD-10-CM | POA: Diagnosis not present

## 2016-05-12 DIAGNOSIS — E785 Hyperlipidemia, unspecified: Secondary | ICD-10-CM | POA: Insufficient documentation

## 2016-05-12 DIAGNOSIS — E119 Type 2 diabetes mellitus without complications: Secondary | ICD-10-CM | POA: Diagnosis not present

## 2016-05-12 DIAGNOSIS — I119 Hypertensive heart disease without heart failure: Secondary | ICD-10-CM | POA: Diagnosis not present

## 2016-05-12 DIAGNOSIS — Z952 Presence of prosthetic heart valve: Secondary | ICD-10-CM | POA: Diagnosis not present

## 2016-05-12 DIAGNOSIS — I313 Pericardial effusion (noninflammatory): Secondary | ICD-10-CM | POA: Insufficient documentation

## 2016-05-12 DIAGNOSIS — Z954 Presence of other heart-valve replacement: Secondary | ICD-10-CM

## 2016-05-12 DIAGNOSIS — I059 Rheumatic mitral valve disease, unspecified: Secondary | ICD-10-CM | POA: Diagnosis present

## 2016-05-12 LAB — ECHOCARDIOGRAM COMPLETE
Area-P 1/2: 2.42 cm2
E decel time: 222 msec
E/e' ratio: 14.92
FS: 40 % (ref 28–44)
IVS/LV PW RATIO, ED: 0.84
LA ID, A-P, ES: 40 mm
LA diam end sys: 40 mm
LA diam index: 2.42 cm/m2
LV E/e' medial: 14.92
LV E/e'average: 14.92
LV PW d: 8.59 mm — AB (ref 0.6–1.1)
LV e' LATERAL: 5.98 cm/s
LVOT MV VTI INDEX: 0.92 cm2/m2
LVOT MV VTI: 1.52
LVOT SV: 40 mL
LVOT VTI: 12.6 cm
LVOT area: 3.14 cm2
LVOT diameter: 20 mm
LVOT peak vel: 51 cm/s
MV Annulus VTI: 26.1 cm
MV Dec: 222
MV M vel: 94.9
MV Peak grad: 3 mmHg
MV pk A vel: 89.7 m/s
MV pk E vel: 89.2 m/s
Mean grad: 4 mmHg
P 1/2 time: 91 ms
Reg peak vel: 225 cm/s
TAPSE: 14.3 mm
TDI e' lateral: 5.98
TDI e' medial: 6.85
TR max vel: 225 cm/s

## 2016-05-13 ENCOUNTER — Ambulatory Visit (INDEPENDENT_AMBULATORY_CARE_PROVIDER_SITE_OTHER): Payer: Medicare Other | Admitting: Cardiology

## 2016-05-13 DIAGNOSIS — Z954 Presence of other heart-valve replacement: Secondary | ICD-10-CM

## 2016-05-13 DIAGNOSIS — I63519 Cerebral infarction due to unspecified occlusion or stenosis of unspecified middle cerebral artery: Secondary | ICD-10-CM

## 2016-05-13 DIAGNOSIS — Z952 Presence of prosthetic heart valve: Secondary | ICD-10-CM

## 2016-05-13 LAB — POCT INR: INR: 2.1

## 2016-05-17 ENCOUNTER — Ambulatory Visit (INDEPENDENT_AMBULATORY_CARE_PROVIDER_SITE_OTHER): Payer: Medicare Other | Admitting: Internal Medicine

## 2016-05-17 DIAGNOSIS — I63519 Cerebral infarction due to unspecified occlusion or stenosis of unspecified middle cerebral artery: Secondary | ICD-10-CM

## 2016-05-17 DIAGNOSIS — Z952 Presence of prosthetic heart valve: Secondary | ICD-10-CM

## 2016-05-17 DIAGNOSIS — Z954 Presence of other heart-valve replacement: Secondary | ICD-10-CM

## 2016-05-17 LAB — POCT INR: INR: 2.6

## 2016-05-24 ENCOUNTER — Ambulatory Visit (INDEPENDENT_AMBULATORY_CARE_PROVIDER_SITE_OTHER): Payer: Medicare Other | Admitting: Pharmacist

## 2016-05-24 DIAGNOSIS — Z952 Presence of prosthetic heart valve: Secondary | ICD-10-CM

## 2016-05-24 DIAGNOSIS — I63519 Cerebral infarction due to unspecified occlusion or stenosis of unspecified middle cerebral artery: Secondary | ICD-10-CM

## 2016-05-24 DIAGNOSIS — Z954 Presence of other heart-valve replacement: Secondary | ICD-10-CM

## 2016-05-24 LAB — POCT INR: INR: 4

## 2016-05-31 LAB — POCT INR: INR: 2.9

## 2016-06-01 ENCOUNTER — Ambulatory Visit (INDEPENDENT_AMBULATORY_CARE_PROVIDER_SITE_OTHER): Payer: Medicare Other | Admitting: Interventional Cardiology

## 2016-06-01 DIAGNOSIS — I63519 Cerebral infarction due to unspecified occlusion or stenosis of unspecified middle cerebral artery: Secondary | ICD-10-CM

## 2016-06-01 DIAGNOSIS — Z954 Presence of other heart-valve replacement: Secondary | ICD-10-CM

## 2016-06-01 DIAGNOSIS — Z952 Presence of prosthetic heart valve: Secondary | ICD-10-CM

## 2016-06-07 LAB — POCT INR: INR: 4.7

## 2016-06-08 ENCOUNTER — Ambulatory Visit (INDEPENDENT_AMBULATORY_CARE_PROVIDER_SITE_OTHER): Payer: Medicare Other | Admitting: Interventional Cardiology

## 2016-06-08 DIAGNOSIS — Z954 Presence of other heart-valve replacement: Secondary | ICD-10-CM

## 2016-06-08 DIAGNOSIS — Z952 Presence of prosthetic heart valve: Secondary | ICD-10-CM

## 2016-06-08 DIAGNOSIS — I63519 Cerebral infarction due to unspecified occlusion or stenosis of unspecified middle cerebral artery: Secondary | ICD-10-CM

## 2016-06-14 DIAGNOSIS — Z952 Presence of prosthetic heart valve: Secondary | ICD-10-CM | POA: Diagnosis not present

## 2016-06-15 ENCOUNTER — Ambulatory Visit (INDEPENDENT_AMBULATORY_CARE_PROVIDER_SITE_OTHER): Payer: Medicare Other | Admitting: Interventional Cardiology

## 2016-06-15 DIAGNOSIS — Z952 Presence of prosthetic heart valve: Secondary | ICD-10-CM

## 2016-06-15 DIAGNOSIS — Z954 Presence of other heart-valve replacement: Secondary | ICD-10-CM

## 2016-06-15 LAB — POCT INR: INR: 2.7

## 2016-06-21 ENCOUNTER — Encounter: Payer: Self-pay | Admitting: Pharmacist

## 2016-06-21 ENCOUNTER — Ambulatory Visit (INDEPENDENT_AMBULATORY_CARE_PROVIDER_SITE_OTHER): Payer: Medicare Other | Admitting: Cardiology

## 2016-06-21 DIAGNOSIS — Z952 Presence of prosthetic heart valve: Secondary | ICD-10-CM

## 2016-06-21 DIAGNOSIS — Z954 Presence of other heart-valve replacement: Secondary | ICD-10-CM

## 2016-06-21 LAB — POCT INR: INR: 4.3

## 2016-06-21 NOTE — Telephone Encounter (Signed)
This encounter was created in error - please disregard.

## 2016-06-28 ENCOUNTER — Ambulatory Visit (INDEPENDENT_AMBULATORY_CARE_PROVIDER_SITE_OTHER): Payer: Medicare Other | Admitting: Pharmacist

## 2016-06-28 DIAGNOSIS — Z954 Presence of other heart-valve replacement: Secondary | ICD-10-CM

## 2016-06-28 DIAGNOSIS — Z952 Presence of prosthetic heart valve: Secondary | ICD-10-CM

## 2016-06-28 LAB — POCT INR: INR: 3.6

## 2016-07-05 ENCOUNTER — Ambulatory Visit (INDEPENDENT_AMBULATORY_CARE_PROVIDER_SITE_OTHER): Payer: Medicare Other | Admitting: Cardiology

## 2016-07-05 DIAGNOSIS — Z952 Presence of prosthetic heart valve: Secondary | ICD-10-CM

## 2016-07-05 DIAGNOSIS — Z954 Presence of other heart-valve replacement: Secondary | ICD-10-CM

## 2016-07-05 LAB — POCT INR: INR: 3.1

## 2016-07-12 DIAGNOSIS — Z952 Presence of prosthetic heart valve: Secondary | ICD-10-CM | POA: Diagnosis not present

## 2016-07-12 LAB — POCT INR: INR: 3.2

## 2016-07-13 ENCOUNTER — Ambulatory Visit (INDEPENDENT_AMBULATORY_CARE_PROVIDER_SITE_OTHER): Payer: Medicare Other

## 2016-07-13 DIAGNOSIS — Z952 Presence of prosthetic heart valve: Secondary | ICD-10-CM

## 2016-07-13 DIAGNOSIS — Z954 Presence of other heart-valve replacement: Secondary | ICD-10-CM

## 2016-07-24 DIAGNOSIS — Z23 Encounter for immunization: Secondary | ICD-10-CM | POA: Diagnosis not present

## 2016-07-26 ENCOUNTER — Ambulatory Visit (INDEPENDENT_AMBULATORY_CARE_PROVIDER_SITE_OTHER): Payer: Medicare Other | Admitting: Cardiovascular Disease

## 2016-07-26 DIAGNOSIS — Z954 Presence of other heart-valve replacement: Secondary | ICD-10-CM

## 2016-07-26 DIAGNOSIS — Z952 Presence of prosthetic heart valve: Secondary | ICD-10-CM

## 2016-07-26 LAB — POCT INR: INR: 4.2

## 2016-08-05 ENCOUNTER — Ambulatory Visit (INDEPENDENT_AMBULATORY_CARE_PROVIDER_SITE_OTHER): Payer: Medicare Other

## 2016-08-05 DIAGNOSIS — Z954 Presence of other heart-valve replacement: Secondary | ICD-10-CM

## 2016-08-05 DIAGNOSIS — Z952 Presence of prosthetic heart valve: Secondary | ICD-10-CM

## 2016-08-05 LAB — POCT INR: INR: 2.9

## 2016-08-09 DIAGNOSIS — Z952 Presence of prosthetic heart valve: Secondary | ICD-10-CM | POA: Diagnosis not present

## 2016-08-11 ENCOUNTER — Encounter: Payer: Self-pay | Admitting: Cardiovascular Disease

## 2016-08-11 ENCOUNTER — Encounter (INDEPENDENT_AMBULATORY_CARE_PROVIDER_SITE_OTHER): Payer: Self-pay

## 2016-08-11 ENCOUNTER — Ambulatory Visit (INDEPENDENT_AMBULATORY_CARE_PROVIDER_SITE_OTHER): Payer: Medicare Other | Admitting: Cardiovascular Disease

## 2016-08-11 VITALS — BP 118/64 | HR 95 | Ht 67.5 in | Wt 129.8 lb

## 2016-08-11 DIAGNOSIS — I10 Essential (primary) hypertension: Secondary | ICD-10-CM

## 2016-08-11 DIAGNOSIS — Z952 Presence of prosthetic heart valve: Secondary | ICD-10-CM

## 2016-08-11 DIAGNOSIS — I63519 Cerebral infarction due to unspecified occlusion or stenosis of unspecified middle cerebral artery: Secondary | ICD-10-CM | POA: Diagnosis not present

## 2016-08-11 NOTE — Progress Notes (Signed)
Cardiology Office Note   Date:  08/11/2016   ID:  [name], DOB 06/21/1946, MRN [id number]  PCP:  Gabrielle Pao, MD  Cardiologist:   Gabrielle Moores, MD   No chief complaint on file.  Problem List 1. Mitral valve replacement  - at age 70.  2. Essential HTN 3. Hyperlipidemia 4. DM.     [name] is a 70 y.o. female who presents for management of her mitral valve replacement . Was seen with her husband [name].  Moved from [place], [place] recently  Has had 4 strokes and a TIA.  She does her own INR monitoring  - the strokes were not associated with marked INR variations Exercises some, not as much as when she lived in [place].   Does water aerobics.  Walks on occasion .  Also does physical therapy exercises.   April 13, 2016:  Doing well.  INRs have been OK. ( 3.2)   She measures her own.  Last week the INR levels were off when she was on Abx for a UTI .   Oct. 4, 2017:  Doing well.  S/p MVR 1997.   INR levels are ok , checks it at home and in our office  2. 9 last week     Past Medical History:  Diagnosis Date  . Allergy   . Diabetes mellitus without complication ([place])   . Stroke [facility])     Past Surgical History:  Procedure Laterality Date  . ABDOMINAL HYSTERECTOMY    . BRAIN SURGERY    . CARDIAC VALVE REPLACEMENT       Current Outpatient Prescriptions  Medication Sig Dispense Refill  . AMLODIPINE BESYLATE PO Take 10 mg by mouth daily.    Marland Kitchen amoxicillin (AMOXIL) 500 MG tablet 4 tablets (2G) to be taken by mouth 1 hour prior to dental work    . aspirin EC 81 MG tablet Take 81 mg by mouth daily.     Marland Kitchen atorvastatin (LIPITOR) 20 MG tablet Take 20 mg by mouth daily.    . benzonatate (TESSALON) 100 MG capsule Take 100 mg by mouth as needed for cough.    . calcium carbonate 1250 MG capsule Take 1,200 mg by mouth 2 (two) times daily with a meal.    . cholecalciferol (VITAMIN D) 1000 UNITS tablet Take 2,000 Units by mouth daily. TAKE TWO TABS BY MOUTH DAILY      . denosumab (PROLIA) 60 MG/ML SOLN injection Inject 60 mg into the skin every 6 (six) months. Administer in upper arm, thigh, or abdomen    . docusate sodium (COLACE) 100 MG capsule Take 100 mg by mouth daily.     Marland Kitchen omeprazole (PRILOSEC) 20 MG capsule Take 20 mg by mouth daily.    Marland Kitchen warfarin (COUMADIN) 5 MG tablet Take 5 mg by mouth as directed.      No current facility-administered medications for this visit.     Allergies:   Zoloft [sertraline]    Social History:  The patient  reports that she has never smoked. She does not have any smokeless tobacco history on file.   Family History:  The patient's family history includes Cancer in her mother; Heart disease in her mother; Hyperlipidemia in her mother; Hypertension in her father and mother; Stroke in her father.    ROS:  Please see the history of present illness.    Review of Systems: Constitutional:  denies fever, chills, diaphoresis, appetite change and fatigue.  HEENT: denies photophobia, eye pain,  redness, hearing loss, ear pain, congestion, sore throat, rhinorrhea, sneezing, neck pain, neck stiffness and tinnitus.  Respiratory: denies SOB, DOE, cough, chest tightness, and wheezing.  Cardiovascular: denies chest pain, palpitations and leg swelling.  Gastrointestinal: denies nausea, vomiting, abdominal pain, diarrhea, constipation, blood in stool.  Genitourinary: denies dysuria, urgency, frequency, hematuria, flank pain and difficulty urinating.  Musculoskeletal: denies  myalgias, back pain, joint swelling, arthralgias and gait problem.   Skin: denies pallor, rash and wound.  Neurological: denies dizziness, seizures, syncope, weakness, light-headedness, numbness and headaches.   Hematological: denies adenopathy, easy bruising, personal or family bleeding history.  Psychiatric/ Behavioral: denies suicidal ideation, mood changes, confusion, nervousness, sleep disturbance and agitation.       All other systems are reviewed and  negative.    PHYSICAL EXAM: VS:  BP 118/64   Pulse 95   Ht 5' 7.5" (1.715 m)   Wt 129 lb 12.8 oz (58.9 kg)   SpO2 96%   BMI 20.03 kg/m  , BMI Body mass index is 20.03 kg/m. GEN: Well nourished, well developed, in no acute distress  HEENT: normal  Neck: no JVD, carotid bruits, or masses Cardiac: RRR; no murmurs, rubs, or gallops,no edema  Respiratory:  clear to auscultation bilaterally, normal work of breathing GI: soft, nontender, nondistended, + BS MS: no deformity or atrophy  Skin: warm and dry, no rash Neuro:  Strength and sensation are intact Psych: normal  he abdomen normal saline every every day recommendations everywhere  EKG:  EKG is ordered today. The ekg ordered today demonstrates NSR at 68  with 1st degree AV block   Poor R wave progression    Recent Labs: No results found for requested labs within last 8760 hours.    Lipid Panel No results found for: CHOL, TRIG, HDL, CHOLHDL, VLDL, LDLCALC, LDLDIRECT    Wt Readings from Last 3 Encounters:  08/11/16 129 lb 12.8 oz (58.9 kg)  04/13/16 130 lb (59 kg)  10/16/15 128 lb 12.8 oz (58.4 kg)      Other studies Reviewed: Additional studies/ records that were reviewed today include:  Review of the above records demonstrates:    ASSESSMENT AND PLAN:  1.   Mitral valve replacement: [name] presents for follow-up of mitral valve placement. She had mechanical mitral valve placed at age 58. She monitors her INR levels at home. She has had a history of strokes.  She is on ASA 81 mg a day   echo in July, 2017 shows normal LVfunction with a normal mechanical mitral valve.    continue current medications.   2. Hypertension: Continue Amlodipine 10 mg a day .   BP is stable   3. Frequent falls:   Advised her to talk to her medical doctor or a physical therapist.    Current medicines are reviewed at length with the patient today.  The patient does not have concerns regarding medicines.  The following changes have  been made:  no change  Labs/ tests ordered today include:   No orders of the defined types were placed in this encounter.    Disposition:   FU with me in 6 months      Gabrielle Moores, MD  08/11/2016 11:03 AM    [place] [place], [place], [place]  [address] Phone: [phone]; Fax: [phone]

## 2016-08-11 NOTE — Patient Instructions (Signed)
Medication Instructions:  Your physician recommends that you continue on your current medications as directed. Please refer to the Current Medication list given to you today.   Labwork: Fax copies of lab work done by Dr. Osborne Casco to our office at [phone]   Testing/Procedures: None Ordered   Follow-Up: Your physician wants you to follow-up in: 6 months with Dr. Acie Fredrickson.  You will receive a reminder letter in the mail two months in advance. If you don't receive a letter, please call our office to schedule the follow-up appointment.   If you need a refill on your cardiac medications before your next appointment, please call your pharmacy.   Thank you for choosing [facility]! Christen Bame, RN [phone]

## 2016-08-12 ENCOUNTER — Ambulatory Visit (INDEPENDENT_AMBULATORY_CARE_PROVIDER_SITE_OTHER): Payer: Medicare Other | Admitting: Internal Medicine

## 2016-08-12 DIAGNOSIS — Z952 Presence of prosthetic heart valve: Secondary | ICD-10-CM

## 2016-08-12 LAB — POCT INR: INR: 4

## 2016-08-19 LAB — POCT INR: INR: 2.8

## 2016-08-20 ENCOUNTER — Ambulatory Visit (INDEPENDENT_AMBULATORY_CARE_PROVIDER_SITE_OTHER): Payer: Medicare Other | Admitting: Internal Medicine

## 2016-08-20 DIAGNOSIS — Z952 Presence of prosthetic heart valve: Secondary | ICD-10-CM

## 2016-08-23 DIAGNOSIS — Z681 Body mass index (BMI) 19 or less, adult: Secondary | ICD-10-CM | POA: Diagnosis not present

## 2016-08-23 DIAGNOSIS — I69998 Other sequelae following unspecified cerebrovascular disease: Secondary | ICD-10-CM | POA: Diagnosis not present

## 2016-08-23 DIAGNOSIS — E7800 Pure hypercholesterolemia, unspecified: Secondary | ICD-10-CM | POA: Diagnosis not present

## 2016-08-23 DIAGNOSIS — R7301 Impaired fasting glucose: Secondary | ICD-10-CM | POA: Diagnosis not present

## 2016-08-23 DIAGNOSIS — K219 Gastro-esophageal reflux disease without esophagitis: Secondary | ICD-10-CM | POA: Diagnosis not present

## 2016-08-23 DIAGNOSIS — M810 Age-related osteoporosis without current pathological fracture: Secondary | ICD-10-CM | POA: Diagnosis not present

## 2016-08-23 DIAGNOSIS — Z7901 Long term (current) use of anticoagulants: Secondary | ICD-10-CM | POA: Diagnosis not present

## 2016-08-23 DIAGNOSIS — I679 Cerebrovascular disease, unspecified: Secondary | ICD-10-CM | POA: Diagnosis not present

## 2016-08-23 DIAGNOSIS — I10 Essential (primary) hypertension: Secondary | ICD-10-CM | POA: Diagnosis not present

## 2016-08-23 DIAGNOSIS — D692 Other nonthrombocytopenic purpura: Secondary | ICD-10-CM | POA: Diagnosis not present

## 2016-08-23 DIAGNOSIS — Z952 Presence of prosthetic heart valve: Secondary | ICD-10-CM | POA: Diagnosis not present

## 2016-08-26 ENCOUNTER — Telehealth: Payer: Self-pay | Admitting: Cardiovascular Disease

## 2016-08-26 ENCOUNTER — Ambulatory Visit (INDEPENDENT_AMBULATORY_CARE_PROVIDER_SITE_OTHER): Payer: Medicare Other | Admitting: Internal Medicine

## 2016-08-26 DIAGNOSIS — Z952 Presence of prosthetic heart valve: Secondary | ICD-10-CM

## 2016-08-26 LAB — POCT INR: INR: 3.2

## 2016-08-26 NOTE — Telephone Encounter (Signed)
Calling stating that the company that does her INR home checks needs the POF forms faxed back to them.  The fax number is [phone] and phone number is [phone].  Will forward to Dr. Acie Fredrickson and Duaine Dredge for advise.

## 2016-08-26 NOTE — Telephone Encounter (Signed)
Follow Up:    The company she is doing her home INR check with, would like for you to return the POF forms asap please.The fax number is [phone] and phone number is [phone]

## 2016-08-26 NOTE — Telephone Encounter (Signed)
Will forward this to our pharmacy team .  I think they may have these forms

## 2016-08-27 NOTE — Telephone Encounter (Signed)
We do not have these forms. Called the number listed from earlier message and was told that it was the incorrect number. Called the second number they gave me [phone]) and spoke with a customer service representative. They will fax over the forms that they need signed.

## 2016-08-31 DIAGNOSIS — I639 Cerebral infarction, unspecified: Secondary | ICD-10-CM | POA: Diagnosis not present

## 2016-08-31 DIAGNOSIS — R531 Weakness: Secondary | ICD-10-CM | POA: Diagnosis not present

## 2016-08-31 DIAGNOSIS — M810 Age-related osteoporosis without current pathological fracture: Secondary | ICD-10-CM | POA: Diagnosis not present

## 2016-09-01 ENCOUNTER — Other Ambulatory Visit (HOSPITAL_COMMUNITY): Payer: Self-pay | Admitting: *Deleted

## 2016-09-01 ENCOUNTER — Ambulatory Visit (HOSPITAL_COMMUNITY)
Admission: RE | Admit: 2016-09-01 | Discharge: 2016-09-01 | Disposition: A | Discharge status: Home / Self Care | Payer: Medicare Other | Source: Ambulatory Visit | Attending: Internal Medicine | Admitting: Internal Medicine

## 2016-09-01 DIAGNOSIS — M810 Age-related osteoporosis without current pathological fracture: Secondary | ICD-10-CM | POA: Insufficient documentation

## 2016-09-01 MED ORDER — DENOSUMAB 60 MG/ML ~~LOC~~ SOLN
60.0000 mg | Freq: Once | SUBCUTANEOUS | Status: AC
  Administered 2016-09-01: 60 mg via SUBCUTANEOUS
  Filled 2016-09-01: qty 1

## 2016-09-02 ENCOUNTER — Inpatient Hospital Stay (HOSPITAL_COMMUNITY): Admission: RE | Admit: 2016-09-02 | Payer: Medicare Other | Source: Ambulatory Visit

## 2016-09-03 NOTE — Telephone Encounter (Signed)
Have called 3 times since we have not yet received these forms. [employer] states they have been faxing over the forms, verified correct fax number. Still have not received anything. Called again today and the representative stated they had faxed forms over 2-3 times. Told her we have not received anything and that our fax machine has been working properly. She stated they would try to fax over forms again.

## 2016-09-06 ENCOUNTER — Ambulatory Visit (INDEPENDENT_AMBULATORY_CARE_PROVIDER_SITE_OTHER): Payer: Medicare Other | Admitting: Internal Medicine

## 2016-09-06 DIAGNOSIS — Z952 Presence of prosthetic heart valve: Secondary | ICD-10-CM | POA: Diagnosis not present

## 2016-09-06 LAB — POCT INR: INR: 3.3

## 2016-09-07 ENCOUNTER — Ambulatory Visit (INDEPENDENT_AMBULATORY_CARE_PROVIDER_SITE_OTHER): Payer: Medicare Other | Admitting: Neurology

## 2016-09-07 ENCOUNTER — Encounter: Payer: Self-pay | Admitting: Neurology

## 2016-09-07 DIAGNOSIS — R269 Unspecified abnormalities of gait and mobility: Secondary | ICD-10-CM | POA: Diagnosis not present

## 2016-09-07 DIAGNOSIS — M810 Age-related osteoporosis without current pathological fracture: Secondary | ICD-10-CM | POA: Diagnosis not present

## 2016-09-07 DIAGNOSIS — I69359 Hemiplegia and hemiparesis following cerebral infarction affecting unspecified side: Secondary | ICD-10-CM

## 2016-09-07 DIAGNOSIS — I63519 Cerebral infarction due to unspecified occlusion or stenosis of unspecified middle cerebral artery: Secondary | ICD-10-CM | POA: Diagnosis not present

## 2016-09-07 DIAGNOSIS — I639 Cerebral infarction, unspecified: Secondary | ICD-10-CM | POA: Diagnosis not present

## 2016-09-07 DIAGNOSIS — I69398 Other sequelae of cerebral infarction: Secondary | ICD-10-CM | POA: Diagnosis not present

## 2016-09-07 DIAGNOSIS — R531 Weakness: Secondary | ICD-10-CM | POA: Diagnosis not present

## 2016-09-07 HISTORY — DX: Other sequelae of cerebral infarction: I69.359

## 2016-09-07 HISTORY — DX: Unspecified abnormalities of gait and mobility: R26.9

## 2016-09-07 HISTORY — DX: Hemiplegia and hemiparesis following cerebral infarction affecting unspecified side: I69.398

## 2016-09-07 NOTE — Telephone Encounter (Signed)
Called [employer] for the 4th time and asked them to email me the required forms since they have apparently tried faxing them 4 times and we still have not received anything. They stated they would do this yesterday, still have not received anything.

## 2016-09-07 NOTE — Patient Instructions (Addendum)
Fall Prevention in the Home  Falls can cause injuries and can affect people from all age groups. There are many simple things that you can do to make your home safe and to help prevent falls. WHAT CAN I DO ON THE OUTSIDE OF MY HOME?  Regularly repair the edges of walkways and driveways and fix any cracks.  Remove high doorway thresholds.  Trim any shrubbery on the main path into your home.  Use bright outdoor lighting.  Clear walkways of debris and clutter, including tools and rocks.  Regularly check that handrails are securely fastened and in good repair. Both sides of any steps should have handrails.  Install guardrails along the edges of any raised decks or porches.  Have leaves, snow, and ice cleared regularly.  Use sand or salt on walkways during winter months.  In the garage, clean up any spills right away, including grease or oil spills. WHAT CAN I DO IN THE BATHROOM?  Use night lights.  Install grab bars by the toilet and in the tub and shower. Do not use towel bars as grab bars.  Use non-skid mats or decals on the floor of the tub or shower.  If you need to sit down while you are in the shower, use a plastic, non-slip stool..  Keep the floor dry. Immediately clean up any water that spills on the floor.  Remove soap buildup in the tub or shower on a regular basis.  Attach bath mats securely with double-sided non-slip rug tape.  Remove throw rugs and other tripping hazards from the floor. WHAT CAN I DO IN THE BEDROOM?  Use night lights.  Make sure that a bedside light is easy to reach.  Do not use oversized bedding that drapes onto the floor.  Have a firm chair that has side arms to use for getting dressed.  Remove throw rugs and other tripping hazards from the floor. WHAT CAN I DO IN THE KITCHEN?   Clean up any spills right away.  Avoid walking on wet floors.  Place frequently used items in easy-to-reach places.  If you need to reach for something  above you, use a sturdy step stool that has a grab bar.  Keep electrical cables out of the way.  Do not use floor polish or wax that makes floors slippery. If you have to use wax, make sure that it is non-skid floor wax.  Remove throw rugs and other tripping hazards from the floor. WHAT CAN I DO IN THE STAIRWAYS?  Do not leave any items on the stairs.  Make sure that there are handrails on both sides of the stairs. Fix handrails that are broken or loose. Make sure that handrails are as long as the stairways.  Check any carpeting to make sure that it is firmly attached to the stairs. Fix any carpet that is loose or worn.  Avoid having throw rugs at the top or bottom of stairways, or secure the rugs with carpet tape to prevent them from moving.  Make sure that you have a light switch at the top of the stairs and the bottom of the stairs. If you do not have them, have them installed. WHAT ARE SOME OTHER FALL PREVENTION TIPS?  Wear closed-toe shoes that fit well and support your feet. Wear shoes that have rubber soles or low heels.  When you use a stepladder, make sure that it is completely opened and that the sides are firmly locked. Have someone hold the ladder while you   are using it. Do not climb a closed stepladder.  Add color or contrast paint or tape to grab bars and handrails in your home. Place contrasting color strips on the first and last steps.  Use mobility aids as needed, such as canes, walkers, scooters, and crutches.  Turn on lights if it is dark. Replace any light bulbs that burn out.  Set up furniture so that there are clear paths. Keep the furniture in the same spot.  Fix any uneven floor surfaces.  Choose a carpet design that does not hide the edge of steps of a stairway.  Be aware of any and all pets.  Review your medicines with your healthcare provider. Some medicines can cause dizziness or changes in blood pressure, which increase your risk of falling. Talk  with your health care provider about other ways that you can decrease your risk of falls. This may include working with a physical therapist or trainer to improve your strength, balance, and endurance.   This information is not intended to replace advice given to you by your health care provider. Make sure you discuss any questions you have with your health care provider.   Document Released: 10/15/2002 Document Revised: 03/11/2015 Document Reviewed: 11/29/2014 Elsevier Interactive Patient Education 2016 Elsevier Inc.  

## 2016-09-07 NOTE — Progress Notes (Signed)
Reason for visit: History of cerebrovascular disease  Referring physician: Dr. [name] is a 70 y.o. female  History of present illness:  [name] is a 70 year old right-handed white female with a history of mitral valve disease, status post a mitral valve replacement with a [employer] mechanical valve. The patient had her surgery in December 1997. The patient sustained 2 strokes earlier that year in April and in August. The first stroke was involving the left brain with a right hemiparesis and the second stroke affected the right brain with a left hemiparesis with a hemorrhagic stroke. The patient had another stroke event in September 2014 with a left cerebellar stroke event. 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 The patient has had a residual left hemiparesis associated with spasticity. The patient has been maintained on Coumadin and aspirin therapy. She has recently moved to the [place], [place] area. She was followed through neurology at [facility], but her neurologist passed away. The patient has had no new symptoms of numbness, weakness, or balance changes. The patient denies any recent falls. She denies any significant issues with bowel or bladder control. She denies any visual disturbances or headache. She does have some low back pain. The patient is able to operate a motor vehicle. She is independent for her activities of daily living. She denies any changes in speech or swallowing, she denies any language function abnormalities. She currently is in physical therapy. She uses a cane for ambulation. She is sent to this office for an evaluation.  Past Medical History:  Diagnosis Date  . Allergy   . Diabetes mellitus without complication ([place])   . Pneumonia 1997  . Stroke [facility]) 1997, 2013, 2015    Past Surgical History:  Procedure Laterality Date  . ABDOMINAL HYSTERECTOMY    . BRAIN SURGERY    . BUNIONECTOMY  1993  . CARDIAC VALVE  REPLACEMENT  1997  . TOE SURGERY  1996    Family History  Problem Relation Age of Onset  . Cancer Mother     Bone  . Heart disease Mother   . Hyperlipidemia Mother   . Hypertension Mother   . Stroke Father   . Hypertension Father   . Heart attack Neg Hx     Social history:  reports that she has never smoked. She has never used smokeless tobacco. She reports that she does not drink alcohol or use drugs.  Medications:  Prior to Admission medications   Medication Sig Start Date End Date Taking? Authorizing Provider  AMLODIPINE BESYLATE PO Take 10 mg by mouth daily.   Yes Historical Provider, MD  amoxicillin (AMOXIL) 500 MG tablet 4 tablets (2G) to be taken by mouth 1 hour prior to dental work 04/13/16  Yes Thayer Headings, MD  aspirin EC 81 MG tablet Take 81 mg by mouth daily.    Yes Historical Provider, MD  atorvastatin (LIPITOR) 20 MG tablet Take 20 mg by mouth daily.   Yes Historical Provider, MD  benzonatate (TESSALON) 100 MG capsule Take 100 mg by mouth as needed for cough.   Yes Historical Provider, MD  calcium carbonate 1250 MG capsule Take 1,200 mg by mouth 2 (two) times daily with a meal.   Yes Historical Provider, MD  cholecalciferol (VITAMIN D) 1000 UNITS tablet Take 2,000 Units by mouth daily. TAKE TWO TABS BY MOUTH DAILY   Yes Historical Provider, MD  denosumab (PROLIA) 60 MG/ML SOLN injection Inject 60  mg into the skin every 6 (six) months. Administer in upper arm, thigh, or abdomen   Yes Historical Provider, MD  docusate sodium (COLACE) 100 MG capsule Take 100 mg by mouth daily.    Yes Historical Provider, MD  omeprazole (PRILOSEC) 20 MG capsule Take 20 mg by mouth daily.   Yes Historical Provider, MD  warfarin (COUMADIN) 5 MG tablet Take 5 mg by mouth as directed.    Yes Historical Provider, MD      Allergies  Allergen Reactions  . Zoloft [Sertraline] Hives, Itching and Rash    ROS:  Out of a complete 14 system review of symptoms, the patient complains only of the  following symptoms, and all other reviewed systems are negative.  Moles Blurred vision Snoring Easy bruising, easy bleeding Allergies Weakness Restless legs  Blood pressure 114/66, pulse 72, height 5' 7.5" (1.715 m), weight 129 lb (58.5 kg).  Physical Exam  General: The patient is alert and cooperative at the time of the examination.  Eyes: Pupils are equal, round, and reactive to light. Discs are flat bilaterally.  Neck: The neck is supple, no carotid bruits are noted.  Respiratory: The respiratory examination is clear.  Cardiovascular: The cardiovascular examination reveals a regular rate and rhythm, a valve click is noted in the left lower sternal border.  Skin: Extremities are without significant edema.  Neurologic Exam  Mental status: The patient is alert and oriented x 3 at the time of the examination. The patient has apparent normal recent and remote memory, with an apparently normal attention span and concentration ability.  Cranial nerves: Facial symmetry is present. There is good sensation of the face to pinprick and soft touch bilaterally. The strength of the facial muscles and the muscles to head turning and shoulder shrug are normal bilaterally. Speech is well enunciated, no aphasia or dysarthria is noted. Extraocular movements are full. Visual fields are full. The tongue is midline, and the patient has symmetric elevation of the soft palate. No obvious hearing deficits are noted.  Motor: The motor testing reveals 5 over 5 strength of all 4 extremities, with exception of 4+/5 strength with hip flexion on the left, 4/5 strength with triceps strength on the left. Good symmetric motor tone is noted throughout.  Sensory: Sensory testing is intact to pinprick, soft touch, vibration sensation, and position sense on all 4 extremities, with exception of some decrease in pinprick sensation, vibration sensation, and position sensation on the left leg and foot. No evidence of  extinction is noted.  Coordination: Cerebellar testing reveals good finger-nose-finger and heel-to-shin on the right, some dysmetria noted on the left arm and leg.  Gait and station: Gait is associated with a circumduction type gait with the left leg. Tandem gait was not attempted. Romberg is negative. No drift is seen.  Reflexes: Deep tendon reflexes are elevated on the left arm and left leg compared to the right. Toes are downgoing bilaterally.   Assessment/Plan:  1. Cerebrovascular disease  2. Mitral valve replacement, chronic Coumadin therapy  3. 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 The patient stretches on a regular basis. She is not on medications for spasticity. She is undergoing physical therapy, I have recommended that she maintain some sort of exercise program that helps with stretching and balance such as silver sneakers, yoga or Pilates. The patient will follow-up in about 6 months. We may consider addition of baclofen or tizanidine in the future if needed.  Gabrielle Alexanders MD 09/07/2016 9:41 AM  [facility] [address] [place], [place] [address]  Phone [phone] Fax [phone]

## 2016-09-09 DIAGNOSIS — I639 Cerebral infarction, unspecified: Secondary | ICD-10-CM | POA: Diagnosis not present

## 2016-09-09 DIAGNOSIS — R531 Weakness: Secondary | ICD-10-CM | POA: Diagnosis not present

## 2016-09-09 DIAGNOSIS — M810 Age-related osteoporosis without current pathological fracture: Secondary | ICD-10-CM | POA: Diagnosis not present

## 2016-09-14 DIAGNOSIS — M810 Age-related osteoporosis without current pathological fracture: Secondary | ICD-10-CM | POA: Diagnosis not present

## 2016-09-14 DIAGNOSIS — I639 Cerebral infarction, unspecified: Secondary | ICD-10-CM | POA: Diagnosis not present

## 2016-09-14 DIAGNOSIS — R531 Weakness: Secondary | ICD-10-CM | POA: Diagnosis not present

## 2016-09-16 DIAGNOSIS — I639 Cerebral infarction, unspecified: Secondary | ICD-10-CM | POA: Diagnosis not present

## 2016-09-16 DIAGNOSIS — M810 Age-related osteoporosis without current pathological fracture: Secondary | ICD-10-CM | POA: Diagnosis not present

## 2016-09-16 DIAGNOSIS — R531 Weakness: Secondary | ICD-10-CM | POA: Diagnosis not present

## 2016-09-20 ENCOUNTER — Ambulatory Visit (INDEPENDENT_AMBULATORY_CARE_PROVIDER_SITE_OTHER): Payer: Medicare Other

## 2016-09-20 DIAGNOSIS — Z952 Presence of prosthetic heart valve: Secondary | ICD-10-CM

## 2016-09-20 LAB — POCT INR: INR: 3.8

## 2016-09-21 DIAGNOSIS — M810 Age-related osteoporosis without current pathological fracture: Secondary | ICD-10-CM | POA: Diagnosis not present

## 2016-09-21 DIAGNOSIS — I639 Cerebral infarction, unspecified: Secondary | ICD-10-CM | POA: Diagnosis not present

## 2016-09-21 DIAGNOSIS — R531 Weakness: Secondary | ICD-10-CM | POA: Diagnosis not present

## 2016-09-22 DIAGNOSIS — H43811 Vitreous degeneration, right eye: Secondary | ICD-10-CM | POA: Diagnosis not present

## 2016-09-27 ENCOUNTER — Ambulatory Visit (INDEPENDENT_AMBULATORY_CARE_PROVIDER_SITE_OTHER): Payer: Medicare Other | Admitting: Internal Medicine

## 2016-09-27 DIAGNOSIS — Z952 Presence of prosthetic heart valve: Secondary | ICD-10-CM

## 2016-09-27 LAB — POCT INR: INR: 2.5

## 2016-10-04 DIAGNOSIS — Z952 Presence of prosthetic heart valve: Secondary | ICD-10-CM | POA: Diagnosis not present

## 2016-10-07 ENCOUNTER — Ambulatory Visit (INDEPENDENT_AMBULATORY_CARE_PROVIDER_SITE_OTHER): Payer: Medicare Other | Admitting: Cardiovascular Disease

## 2016-10-07 DIAGNOSIS — Z952 Presence of prosthetic heart valve: Secondary | ICD-10-CM

## 2016-10-07 LAB — POCT INR: INR: 3.6

## 2016-10-11 ENCOUNTER — Ambulatory Visit: Payer: Medicare Other | Admitting: Cardiovascular Disease

## 2016-10-12 DIAGNOSIS — I639 Cerebral infarction, unspecified: Secondary | ICD-10-CM | POA: Diagnosis not present

## 2016-10-12 DIAGNOSIS — R531 Weakness: Secondary | ICD-10-CM | POA: Diagnosis not present

## 2016-10-12 DIAGNOSIS — M810 Age-related osteoporosis without current pathological fracture: Secondary | ICD-10-CM | POA: Diagnosis not present

## 2016-10-14 DIAGNOSIS — R531 Weakness: Secondary | ICD-10-CM | POA: Diagnosis not present

## 2016-10-14 DIAGNOSIS — I639 Cerebral infarction, unspecified: Secondary | ICD-10-CM | POA: Diagnosis not present

## 2016-10-14 DIAGNOSIS — M810 Age-related osteoporosis without current pathological fracture: Secondary | ICD-10-CM | POA: Diagnosis not present

## 2016-10-18 ENCOUNTER — Ambulatory Visit (INDEPENDENT_AMBULATORY_CARE_PROVIDER_SITE_OTHER): Payer: Medicare Other | Admitting: Internal Medicine

## 2016-10-18 DIAGNOSIS — Z952 Presence of prosthetic heart valve: Secondary | ICD-10-CM

## 2016-10-18 LAB — POCT INR: INR: 3.3

## 2016-10-19 DIAGNOSIS — R531 Weakness: Secondary | ICD-10-CM | POA: Diagnosis not present

## 2016-10-19 DIAGNOSIS — M810 Age-related osteoporosis without current pathological fracture: Secondary | ICD-10-CM | POA: Diagnosis not present

## 2016-10-19 DIAGNOSIS — I639 Cerebral infarction, unspecified: Secondary | ICD-10-CM | POA: Diagnosis not present

## 2016-10-26 DIAGNOSIS — R531 Weakness: Secondary | ICD-10-CM | POA: Diagnosis not present

## 2016-10-26 DIAGNOSIS — M810 Age-related osteoporosis without current pathological fracture: Secondary | ICD-10-CM | POA: Diagnosis not present

## 2016-10-26 DIAGNOSIS — I639 Cerebral infarction, unspecified: Secondary | ICD-10-CM | POA: Diagnosis not present

## 2016-10-27 ENCOUNTER — Telehealth: Payer: Self-pay | Admitting: Cardiovascular Disease

## 2016-10-27 NOTE — Telephone Encounter (Signed)
Walk in pt form-please call patient about INR results-placed in Dr.Nahser Box

## 2016-10-28 ENCOUNTER — Ambulatory Visit (INDEPENDENT_AMBULATORY_CARE_PROVIDER_SITE_OTHER): Payer: Medicare Other | Admitting: Internal Medicine

## 2016-10-28 DIAGNOSIS — I639 Cerebral infarction, unspecified: Secondary | ICD-10-CM | POA: Diagnosis not present

## 2016-10-28 DIAGNOSIS — R531 Weakness: Secondary | ICD-10-CM | POA: Diagnosis not present

## 2016-10-28 DIAGNOSIS — M810 Age-related osteoporosis without current pathological fracture: Secondary | ICD-10-CM | POA: Diagnosis not present

## 2016-10-28 DIAGNOSIS — Z952 Presence of prosthetic heart valve: Secondary | ICD-10-CM

## 2016-10-28 LAB — POCT INR: INR: 4.7

## 2016-10-28 NOTE — Telephone Encounter (Signed)
Fuller [place], RPh tried numerous times to get form in October and it was never received.  I called [employer] and asked that form be faxed to [phone] to my attention. I left a message for the patient to call if she had additional information on getting the form, otherwise I am calling to get it resent.

## 2016-11-01 DIAGNOSIS — Z952 Presence of prosthetic heart valve: Secondary | ICD-10-CM | POA: Diagnosis not present

## 2016-11-01 LAB — POCT INR: INR: 2.5

## 2016-11-02 ENCOUNTER — Ambulatory Visit (INDEPENDENT_AMBULATORY_CARE_PROVIDER_SITE_OTHER): Payer: Medicare Other | Admitting: Internal Medicine

## 2016-11-02 DIAGNOSIS — Z952 Presence of prosthetic heart valve: Secondary | ICD-10-CM

## 2016-11-02 DIAGNOSIS — M810 Age-related osteoporosis without current pathological fracture: Secondary | ICD-10-CM | POA: Diagnosis not present

## 2016-11-02 DIAGNOSIS — I639 Cerebral infarction, unspecified: Secondary | ICD-10-CM | POA: Diagnosis not present

## 2016-11-02 DIAGNOSIS — R531 Weakness: Secondary | ICD-10-CM | POA: Diagnosis not present

## 2016-11-03 ENCOUNTER — Telehealth: Payer: Self-pay | Admitting: Cardiovascular Disease

## 2016-11-03 ENCOUNTER — Telehealth: Payer: Self-pay | Admitting: Nurse Practitioner

## 2016-11-03 NOTE — Telephone Encounter (Signed)
Spoke with patient who states she is confused by the call from [facility] regarding her home INR equipment.  I advised that we have tried numerous times since October to get the paperwork faxed to [place] for Dr. Elmarie Shiley signature and have not received it. We have verified fax number with the company and receive multiple other faxes from this machine.  I advised her to contact the company to get the paperwork sent to her by mail or email and to bring to [place] for signature.  She verbalized understanding and agreement and thanked me for the call.

## 2016-11-03 NOTE — Telephone Encounter (Signed)
Received call from patient who states [employer] states they will not send paperwork to the patient.  Patient states the supervisor is supposed to call her and she will try to get more details on how fax is being sent and why it can't be sent to her. I gave her my name and phone number again and advised that the supervisor can call me at the office.  She thanked me for my help.

## 2016-11-03 NOTE — Telephone Encounter (Signed)
New Message  Pt voiced she's needing nurse to call due to POF form that should've been filled out and has not been and ptinr company voiced to pt they're going to stop self testing.  Please f/u with pt

## 2016-11-04 DIAGNOSIS — R531 Weakness: Secondary | ICD-10-CM | POA: Diagnosis not present

## 2016-11-04 DIAGNOSIS — I639 Cerebral infarction, unspecified: Secondary | ICD-10-CM | POA: Diagnosis not present

## 2016-11-04 DIAGNOSIS — M810 Age-related osteoporosis without current pathological fracture: Secondary | ICD-10-CM | POA: Diagnosis not present

## 2016-11-05 NOTE — Telephone Encounter (Signed)
Patient states [name] told her they faxed the Patient Enrollment Form today. Informed her that form has been received and is in Dr. Elmarie Shiley folder to address when he is in the office.  She was grateful for assistance.

## 2016-11-05 NOTE — Telephone Encounter (Signed)
New Message   Patient returning Michelle's phone call about home testing group sending another POF form.

## 2016-11-08 LAB — POCT INR: INR: 4.4

## 2016-11-09 ENCOUNTER — Ambulatory Visit (INDEPENDENT_AMBULATORY_CARE_PROVIDER_SITE_OTHER): Payer: Medicare Other

## 2016-11-09 DIAGNOSIS — Z952 Presence of prosthetic heart valve: Secondary | ICD-10-CM

## 2016-11-10 DIAGNOSIS — R531 Weakness: Secondary | ICD-10-CM | POA: Diagnosis not present

## 2016-11-10 DIAGNOSIS — M810 Age-related osteoporosis without current pathological fracture: Secondary | ICD-10-CM | POA: Diagnosis not present

## 2016-11-10 DIAGNOSIS — I639 Cerebral infarction, unspecified: Secondary | ICD-10-CM | POA: Diagnosis not present

## 2016-11-11 NOTE — Telephone Encounter (Signed)
Left message that paperwork has been faxed and confirmation received and to call back with questions or concerns

## 2016-11-12 DIAGNOSIS — I639 Cerebral infarction, unspecified: Secondary | ICD-10-CM | POA: Diagnosis not present

## 2016-11-12 DIAGNOSIS — M810 Age-related osteoporosis without current pathological fracture: Secondary | ICD-10-CM | POA: Diagnosis not present

## 2016-11-12 DIAGNOSIS — R531 Weakness: Secondary | ICD-10-CM | POA: Diagnosis not present

## 2016-11-15 ENCOUNTER — Ambulatory Visit (INDEPENDENT_AMBULATORY_CARE_PROVIDER_SITE_OTHER): Payer: Medicare Other | Admitting: Internal Medicine

## 2016-11-15 DIAGNOSIS — Z952 Presence of prosthetic heart valve: Secondary | ICD-10-CM

## 2016-11-15 LAB — POCT INR: INR: 2.7

## 2016-11-16 DIAGNOSIS — R531 Weakness: Secondary | ICD-10-CM | POA: Diagnosis not present

## 2016-11-16 DIAGNOSIS — I639 Cerebral infarction, unspecified: Secondary | ICD-10-CM | POA: Diagnosis not present

## 2016-11-16 DIAGNOSIS — M810 Age-related osteoporosis without current pathological fracture: Secondary | ICD-10-CM | POA: Diagnosis not present

## 2016-11-19 DIAGNOSIS — M810 Age-related osteoporosis without current pathological fracture: Secondary | ICD-10-CM | POA: Diagnosis not present

## 2016-11-19 DIAGNOSIS — I639 Cerebral infarction, unspecified: Secondary | ICD-10-CM | POA: Diagnosis not present

## 2016-11-19 DIAGNOSIS — R531 Weakness: Secondary | ICD-10-CM | POA: Diagnosis not present

## 2016-11-22 LAB — POCT INR: INR: 3.7

## 2016-11-23 ENCOUNTER — Ambulatory Visit (INDEPENDENT_AMBULATORY_CARE_PROVIDER_SITE_OTHER): Payer: Medicare Other | Admitting: Internal Medicine

## 2016-11-23 DIAGNOSIS — Z952 Presence of prosthetic heart valve: Secondary | ICD-10-CM

## 2016-11-29 DIAGNOSIS — Z954 Presence of other heart-valve replacement: Secondary | ICD-10-CM | POA: Diagnosis not present

## 2016-11-29 DIAGNOSIS — Z7901 Long term (current) use of anticoagulants: Secondary | ICD-10-CM | POA: Diagnosis not present

## 2016-11-29 LAB — POCT INR: INR: 3.6

## 2016-11-30 ENCOUNTER — Ambulatory Visit (INDEPENDENT_AMBULATORY_CARE_PROVIDER_SITE_OTHER): Payer: Medicare Other | Admitting: Interventional Cardiology

## 2016-11-30 DIAGNOSIS — Z952 Presence of prosthetic heart valve: Secondary | ICD-10-CM

## 2016-12-06 ENCOUNTER — Ambulatory Visit (INDEPENDENT_AMBULATORY_CARE_PROVIDER_SITE_OTHER): Payer: Medicare Other | Admitting: Internal Medicine

## 2016-12-06 DIAGNOSIS — Z952 Presence of prosthetic heart valve: Secondary | ICD-10-CM

## 2016-12-06 LAB — POCT INR: INR: 3.2

## 2016-12-13 ENCOUNTER — Ambulatory Visit (INDEPENDENT_AMBULATORY_CARE_PROVIDER_SITE_OTHER): Payer: Medicare Other | Admitting: Internal Medicine

## 2016-12-13 DIAGNOSIS — Z952 Presence of prosthetic heart valve: Secondary | ICD-10-CM

## 2016-12-13 LAB — POCT INR: INR: 3.5

## 2016-12-14 ENCOUNTER — Telehealth: Payer: Self-pay | Admitting: Cardiovascular Disease

## 2016-12-14 NOTE — Telephone Encounter (Signed)
Spoke with [place] at [facility] [employer] & advised her that we received those results on yesterday. Also, informed her that the INR range for the pt is 3.0-3.5 and that the pt is in her range at 3.5; she stated we should not be calling you with this unless over 3.5 & she plans on updating their records. She will call back with any other concerns.

## 2016-12-14 NOTE — Telephone Encounter (Signed)
New Message:    She needs to give you pt's INR,

## 2016-12-20 ENCOUNTER — Telehealth: Payer: Self-pay | Admitting: Cardiovascular Disease

## 2016-12-20 ENCOUNTER — Ambulatory Visit (INDEPENDENT_AMBULATORY_CARE_PROVIDER_SITE_OTHER): Payer: Medicare Other | Admitting: Cardiology

## 2016-12-20 DIAGNOSIS — Z952 Presence of prosthetic heart valve: Secondary | ICD-10-CM

## 2016-12-20 LAB — POCT INR: INR: 4.1

## 2016-12-20 NOTE — Telephone Encounter (Signed)
New Message:    Pt has an out of [place] INR-

## 2016-12-20 NOTE — Telephone Encounter (Signed)
Spoke with [place] at [facility]. Pt tests INR at home.  [place] is calling to report pt's INR is 4.1 today. Will forward to coumadin clinic

## 2016-12-20 NOTE — Telephone Encounter (Signed)
Returned call to [facility] regarding pt's INR. He stated they needed to let us know that the pt's INR was 4.1; advised that we have already spoken to the pt & dosed by our clinic, advised that the pt will be rechecked in 1 week. He stated ok & ended conversation.

## 2016-12-27 ENCOUNTER — Ambulatory Visit (INDEPENDENT_AMBULATORY_CARE_PROVIDER_SITE_OTHER): Payer: Medicare Other

## 2016-12-27 DIAGNOSIS — Z952 Presence of prosthetic heart valve: Secondary | ICD-10-CM

## 2016-12-27 DIAGNOSIS — Z7901 Long term (current) use of anticoagulants: Secondary | ICD-10-CM | POA: Diagnosis not present

## 2016-12-27 DIAGNOSIS — Z954 Presence of other heart-valve replacement: Secondary | ICD-10-CM | POA: Diagnosis not present

## 2016-12-27 LAB — POCT INR: INR: 2.6

## 2017-01-03 ENCOUNTER — Telehealth: Payer: Self-pay | Admitting: Cardiovascular Disease

## 2017-01-03 ENCOUNTER — Ambulatory Visit (INDEPENDENT_AMBULATORY_CARE_PROVIDER_SITE_OTHER): Payer: Medicare Other | Admitting: Internal Medicine

## 2017-01-03 DIAGNOSIS — Z952 Presence of prosthetic heart valve: Secondary | ICD-10-CM

## 2017-01-03 LAB — POCT INR: INR: 3.7

## 2017-01-03 NOTE — Telephone Encounter (Signed)
°  Error   rn calling to give INR results   The call disconnected

## 2017-01-10 ENCOUNTER — Ambulatory Visit (INDEPENDENT_AMBULATORY_CARE_PROVIDER_SITE_OTHER): Payer: Medicare Other | Admitting: Internal Medicine

## 2017-01-10 DIAGNOSIS — Z952 Presence of prosthetic heart valve: Secondary | ICD-10-CM

## 2017-01-10 LAB — POCT INR: INR: 3.1

## 2017-01-17 LAB — POCT INR: INR: 3.4

## 2017-01-18 ENCOUNTER — Ambulatory Visit (INDEPENDENT_AMBULATORY_CARE_PROVIDER_SITE_OTHER): Payer: Medicare Other | Admitting: Interventional Cardiology

## 2017-01-18 DIAGNOSIS — Z952 Presence of prosthetic heart valve: Secondary | ICD-10-CM

## 2017-01-24 ENCOUNTER — Ambulatory Visit (INDEPENDENT_AMBULATORY_CARE_PROVIDER_SITE_OTHER): Payer: Medicare Other | Admitting: Interventional Cardiology

## 2017-01-24 DIAGNOSIS — Z952 Presence of prosthetic heart valve: Secondary | ICD-10-CM

## 2017-01-24 DIAGNOSIS — Z954 Presence of other heart-valve replacement: Secondary | ICD-10-CM | POA: Diagnosis not present

## 2017-01-24 DIAGNOSIS — Z7901 Long term (current) use of anticoagulants: Secondary | ICD-10-CM | POA: Diagnosis not present

## 2017-01-24 LAB — POCT INR: INR: 3.2

## 2017-01-28 ENCOUNTER — Encounter: Payer: Self-pay | Admitting: Physician Assistant

## 2017-01-31 ENCOUNTER — Ambulatory Visit (INDEPENDENT_AMBULATORY_CARE_PROVIDER_SITE_OTHER): Payer: Medicare Other | Admitting: Pharmacist

## 2017-01-31 DIAGNOSIS — Z952 Presence of prosthetic heart valve: Secondary | ICD-10-CM

## 2017-01-31 LAB — POCT INR: INR: 3.7

## 2017-02-07 ENCOUNTER — Ambulatory Visit (INDEPENDENT_AMBULATORY_CARE_PROVIDER_SITE_OTHER): Payer: Medicare Other | Admitting: Pharmacist

## 2017-02-07 DIAGNOSIS — Z952 Presence of prosthetic heart valve: Secondary | ICD-10-CM

## 2017-02-07 LAB — POCT INR: INR: 2.9

## 2017-02-10 ENCOUNTER — Encounter (INDEPENDENT_AMBULATORY_CARE_PROVIDER_SITE_OTHER): Payer: Self-pay

## 2017-02-10 ENCOUNTER — Ambulatory Visit (INDEPENDENT_AMBULATORY_CARE_PROVIDER_SITE_OTHER): Payer: Medicare Other | Admitting: Neurology

## 2017-02-10 ENCOUNTER — Encounter: Payer: Self-pay | Admitting: Neurology

## 2017-02-10 VITALS — BP 118/82 | HR 72 | Ht 67.5 in | Wt 133.0 lb

## 2017-02-10 DIAGNOSIS — R269 Unspecified abnormalities of gait and mobility: Secondary | ICD-10-CM

## 2017-02-10 DIAGNOSIS — I69359 Hemiplegia and hemiparesis following cerebral infarction affecting unspecified side: Secondary | ICD-10-CM | POA: Diagnosis not present

## 2017-02-10 DIAGNOSIS — I69398 Other sequelae of cerebral infarction: Secondary | ICD-10-CM | POA: Diagnosis not present

## 2017-02-10 NOTE — Progress Notes (Signed)
Reason for visit: Left hemiparesis  Farzana Koci is an 71 y.o. female  History of present illness:  Ms. Valbuena is a 71 year old right-handed white female with a history of multiple bihemispheric strokes associated with a left hemiparesis and a circumduction gait. The patient has significant spasticity involving the left arm and left leg, but she seems to be coping with it fairly well. She is on a regular exercise program, she is walking a mile a day. The patient has not had any falls, she uses a cane when she is outside of the house. She has undergone physical therapy previously. At times, her left leg may kick out at night when sleeping. This is occurring less frequently since she has been exercising regularly. She will stretch out frequently during the day. She returns for an evaluation.  Past Medical History:  Diagnosis Date  . Abnormality of gait 09/07/2016  . Allergy   . Diabetes mellitus without complication ([place])   . Hemiparesis and alteration of sensations as late effects of stroke ([place]) 09/07/2016  . Pneumonia 1997  . Stroke [facility]) 1997, 2013, 2015    Past Surgical History:  Procedure Laterality Date  . ABDOMINAL HYSTERECTOMY    . BRAIN SURGERY    . BUNIONECTOMY  1993  . CARDIAC VALVE REPLACEMENT  1997  . TOE SURGERY  1996    Family History  Problem Relation Age of Onset  . Cancer Mother     Bone  . Heart disease Mother   . Hyperlipidemia Mother   . Hypertension Mother   . Stroke Father   . Hypertension Father   . Heart attack Neg Hx     Social history:  reports that she has never smoked. She has never used smokeless tobacco. She reports that she does not drink alcohol or use drugs.    Allergies  Allergen Reactions  . Zoloft [Sertraline] Hives, Itching and Rash    Medications:  Prior to Admission medications   Medication Sig Start Date End Date Taking? Authorizing Provider  AMLODIPINE BESYLATE PO Take 10 mg by mouth daily.   Yes Historical Provider, MD    amoxicillin (AMOXIL) 500 MG tablet 4 tablets (2G) to be taken by mouth 1 hour prior to dental work 04/13/16  Yes Thayer Headings, MD  aspirin EC 81 MG tablet Take 81 mg by mouth daily.    Yes Historical Provider, MD  atorvastatin (LIPITOR) 20 MG tablet Take 20 mg by mouth daily.   Yes Historical Provider, MD  benzonatate (TESSALON) 100 MG capsule Take 100 mg by mouth as needed for cough.   Yes Historical Provider, MD  calcium carbonate 1250 MG capsule Take 1,200 mg by mouth 2 (two) times daily with a meal.   Yes Historical Provider, MD  cholecalciferol (VITAMIN D) 1000 UNITS tablet Take 2,000 Units by mouth daily. TAKE TWO TABS BY MOUTH DAILY   Yes Historical Provider, MD  denosumab (PROLIA) 60 MG/ML SOLN injection Inject 60 mg into the skin every 6 (six) months. Administer in upper arm, thigh, or abdomen   Yes Historical Provider, MD  docusate sodium (COLACE) 100 MG capsule Take 100 mg by mouth daily.    Yes Historical Provider, MD  omeprazole (PRILOSEC) 20 MG capsule Take 20 mg by mouth daily.   Yes Historical Provider, MD  warfarin (COUMADIN) 5 MG tablet Take 5 mg by mouth as directed.    Yes Historical Provider, MD    ROS:  Out of a complete 14 system review  of symptoms, the patient complains only of the following symptoms, and all other reviewed systems are negative.  Shortness of breath Frequency of urination, urinary urgency Snoring Back pain Moles Bleeding easily  Blood pressure 118/82, pulse 72, height 5' 7.5" (1.715 m), weight 133 lb (60.3 kg).  Physical Exam  General: The patient is alert and cooperative at the time of the examination.  Skin: No significant peripheral edema is noted.   Neurologic Exam  Mental status: The patient is alert and oriented x 3 at the time of the examination. The patient has apparent normal recent and remote memory, with an apparently normal attention span and concentration ability.   Cranial nerves: Facial symmetry is present. Speech is  normal, no aphasia or dysarthria is noted. Extraocular movements are full. Visual fields are full.  Motor: The patient has good strength in all 4 extremities, increased motor tone is noted on the left arm and left leg.  Sensory examination: Soft touch sensation is symmetric on the face, arms, and legs.  Coordination: The patient has good finger-nose-finger and heel-to-shin on the right, the patient has some difficulty performing these tasks on the left side.  Gait and station: The patient has a circumduction gait with the left leg, the left arm is in flexion. Tandem gait was not attempted. Romberg is negative. No drift is seen.  Reflexes: Deep tendon reflexes are slightly elevated on the left arm and left leg.   Assessment/Plan:  1. Cerebrovascular disease, bihemispheric strokes  2. Left hemiparesis  3. Gait disorder, circumduction gait left leg  The patient appears to be fairly stable. She is doing well without medications for spasticity, she is remaining active. We will follow the patient conservatively, she will check back in one year. She will call if there are any concerns or questions.  Jill Alexanders MD 02/10/2017 2:56 PM  [facility] [address] [place], [place] [address]  Phone [phone] Fax [phone]

## 2017-02-11 ENCOUNTER — Encounter: Payer: Self-pay | Admitting: Physician Assistant

## 2017-02-11 ENCOUNTER — Ambulatory Visit (INDEPENDENT_AMBULATORY_CARE_PROVIDER_SITE_OTHER): Payer: Medicare Other | Admitting: Physician Assistant

## 2017-02-11 VITALS — BP 104/56 | HR 72 | Ht 67.0 in | Wt 133.0 lb

## 2017-02-11 DIAGNOSIS — I10 Essential (primary) hypertension: Secondary | ICD-10-CM | POA: Diagnosis not present

## 2017-02-11 DIAGNOSIS — Z952 Presence of prosthetic heart valve: Secondary | ICD-10-CM

## 2017-02-11 DIAGNOSIS — I69359 Hemiplegia and hemiparesis following cerebral infarction affecting unspecified side: Secondary | ICD-10-CM | POA: Diagnosis not present

## 2017-02-11 NOTE — Patient Instructions (Addendum)
Medication Instructions:  No changes.   Labwork: None   Testing/Procedures: None   Follow-Up: Your physician wants you to follow-up in: [place] DR. Acie Fredrickson You will receive a reminder letter in the mail two months in advance. If you don't receive a letter, please call our office to schedule the follow-up appointment.  Any Other Special Instructions Will Be Listed Below (If Applicable).  If you need a refill on your cardiac medications before your next appointment, please call your pharmacy.

## 2017-02-11 NOTE — Progress Notes (Signed)
Cardiology Office Note:    Date:  02/11/2017   ID:  [name], DOB 08-03-1946, MRN [id number]  PCP:  Haywood Pao, MD  Cardiologist:  Dr. Liam Rogers   Electrophysiologist:  n/a  Referring MD: Haywood Pao, MD   Chief Complaint  Patient presents with  . Follow-up    Status post mitral valve replacement    History of Present Illness:    [name] is a 71 y.o. female with a hx of Mechanical mitral valve replacement at age 16 (in [place]), HTN, HL, diabetes, prior stroke. She moved to [facility] [facility] and established with Dr. Acie Fredrickson in 12/16. Last seen by Dr. Acie Fredrickson 10/17.   She returns for follow-up. She is here alone. She has been doing well since last seen. She denies chest discomfort. She does note shortness of breath with some activities. However, this is a chronic symptom that has been stable for a long time. She denies orthopnea, PND or edema. She denies syncope. She denies any bleeding issues.  Prior CV studies:   The following studies were reviewed today:  Echo 7/17 EF 55-60, normal wall motion, bileaflet mechanical mitral valve prosthesis functioning normally, mild LAE, mildly reduced RVSF, small pericardial effusion  Past Medical History:  Diagnosis Date  . Abnormality of gait 09/07/2016  . Allergy   . Diabetes mellitus without complication [facility])    Patient denies this - notes history of glucose intolerance  . Hemiparesis and alteration of sensations as late effects of stroke ([place]) 09/07/2016  . History of pneumonia 1997  . S/P MVR (mitral valve replacement)    Mechanical mitral valve replacement at age 54 (done in [place])  // echo 7/17: EF 55-60, normal wall motion, bileaflet mechanical mitral valve prosthesis functioning normally, mild LAE, mildly reduced RVSF, small pericardial effusion  . Stroke [facility]) 1997, 2013, 2015    Past Surgical History:  Procedure Laterality Date  . ABDOMINAL HYSTERECTOMY    . BRAIN SURGERY    . BUNIONECTOMY  1993  .  CARDIAC VALVE REPLACEMENT  1997  . TOE SURGERY  1996    Current Medications: Current Meds  Medication Sig  . AMLODIPINE BESYLATE PO Take 10 mg by mouth daily.  Marland Kitchen aspirin EC 81 MG tablet Take 81 mg by mouth daily.   Marland Kitchen atorvastatin (LIPITOR) 20 MG tablet Take 20 mg by mouth daily.  . benzonatate (TESSALON) 100 MG capsule Take 100 mg by mouth as needed for cough.  . calcium carbonate 1250 MG capsule Take 1,200 mg by mouth 2 (two) times daily with a meal.  . cholecalciferol (VITAMIN D) 1000 UNITS tablet Take 2,000 Units by mouth daily. TAKE TWO TABS BY MOUTH DAILY  . denosumab (PROLIA) 60 MG/ML SOLN injection Inject 60 mg into the skin every 6 (six) months. Administer in upper arm, thigh, or abdomen  . docusate sodium (COLACE) 100 MG capsule Take 100 mg by mouth daily.   Marland Kitchen omeprazole (PRILOSEC) 20 MG capsule Take 20 mg by mouth daily.  Marland Kitchen warfarin (COUMADIN) 5 MG tablet Take 5 mg by mouth as directed.      Allergies:   Zoloft [sertraline]   Social History   Social History  . Marital status: Married    Spouse name: N/A  . Number of children: 2  . 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 Retired    Social History Main Topics  . Smoking status: Never Smoker  . Smokeless tobacco: Never Used  . Alcohol  use No  . Drug use: No  . Sexual activity: Not Asked     Comment: Married   Other Topics Concern  . None   Social History Narrative   Lives at home w/ her husband   Right-handed   Caffeine: none     Family History  Problem Relation Age of Onset  . Cancer Mother     Bone  . Heart disease Mother   . Hyperlipidemia Mother   . Hypertension Mother   . Stroke Father   . Hypertension Father   . Heart attack Neg Hx      ROS:   Please see the history of present illness.    Review of Systems  Respiratory: Positive for snoring.   Hematologic/Lymphatic: Bruises/bleeds easily.  Musculoskeletal: Positive for back pain.  Neurological: Positive for loss  of balance.   All other systems reviewed and are negative.   EKGs/Labs/Other Test Reviewed:    EKG:  EKG is  ordered today.  The ekg ordered today demonstrates NSR, HR 72, normal axis, first-degree AV block, PR interval 274 ms, QTC 444 ms, similar to prior tracings  Recent Labs: No results found for requested labs within last 8760 hours.   Recent Lipid Panel No results found for: CHOL, TRIG, HDL, CHOLHDL, VLDL, LDLCALC, LDLDIRECT   Physical Exam:    VS:  BP (!) 104/56   Pulse 72   Ht 5\' 7"  (1.702 m)   Wt 133 lb (60.3 kg)   BMI 20.83 kg/m     Wt Readings from Last 3 Encounters:  02/11/17 133 lb (60.3 kg)  02/10/17 133 lb (60.3 kg)  09/07/16 129 lb (58.5 kg)     Physical Exam  Constitutional: She is oriented to person, place, and time. She appears well-developed and well-nourished. No distress.  HENT:  Head: Normocephalic and atraumatic.  Eyes: No scleral icterus.  Neck: Normal range of motion. No JVD present.  Cardiovascular: Normal rate, regular rhythm, S2 normal and normal heart sounds.   No murmur heard. Mechanical S1  Pulmonary/Chest: Breath sounds normal. She has no wheezes. She has no rhonchi. She has no rales.  Abdominal: Soft. There is no tenderness.  Musculoskeletal: She exhibits no edema.  Neurological: She is alert and oriented to person, place, and time.  Skin: Skin is warm and dry.  Psychiatric: She has a normal mood and affect.    ASSESSMENT:    1. H/O mitral valve replacement with mechanical valve   2. Essential hypertension   3. 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 H/O mitral valve replacement with mechanical valve - Overall doing well. She is tolerating Coumadin. Her last several INRs have been in the therapeutic range. She denies any bleeding issues. Echocardiogram in 7/17 demonstrated normally functioning mechanical mitral valve replacement. Continue SBE prophylaxis.  2.  Essential hypertension -  BP controlled.   3. 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  Dispo:  Return in about 6 months (around 08/13/2017) for Routine Follow Up, w/ Dr. Acie Fredrickson.   Medication Adjustments/Labs and Tests Ordered: Current medicines are reviewed at length with the patient today.  Concerns regarding medicines are outlined above.  Medication changes, Labs and Tests ordered today are outlined in the Patient Instructions noted below. Patient Instructions  Medication Instructions:  No changes.   Labwork: None   Testing/Procedures: None  Follow-Up: Your physician wants you to follow-up in: 6 MONTHS WITH DR. Acie Fredrickson You will receive a reminder letter in the mail two months in advance. If you don't receive a letter, please call our office to schedule the follow-up appointment.  Any Other Special Instructions Will Be Listed Below (If Applicable).  If you need a refill on your cardiac medications before your next appointment, please call your pharmacy.   Signed, Richardson Dopp, PA-C  02/11/2017 1:13 PM    [facility] [address], [place], [place]  [address] Phone: [phone]; Fax: [phone]

## 2017-02-14 ENCOUNTER — Ambulatory Visit (INDEPENDENT_AMBULATORY_CARE_PROVIDER_SITE_OTHER): Payer: Self-pay | Admitting: Internal Medicine

## 2017-02-14 DIAGNOSIS — I69359 Hemiplegia and hemiparesis following cerebral infarction affecting unspecified side: Secondary | ICD-10-CM

## 2017-02-14 DIAGNOSIS — Z952 Presence of prosthetic heart valve: Secondary | ICD-10-CM

## 2017-02-14 LAB — POCT INR: INR: 3.2

## 2017-02-21 ENCOUNTER — Ambulatory Visit (INDEPENDENT_AMBULATORY_CARE_PROVIDER_SITE_OTHER): Payer: Medicare Other | Admitting: Cardiovascular Disease

## 2017-02-21 DIAGNOSIS — Z954 Presence of other heart-valve replacement: Secondary | ICD-10-CM | POA: Diagnosis not present

## 2017-02-21 DIAGNOSIS — I69359 Hemiplegia and hemiparesis following cerebral infarction affecting unspecified side: Secondary | ICD-10-CM

## 2017-02-21 DIAGNOSIS — Z952 Presence of prosthetic heart valve: Secondary | ICD-10-CM

## 2017-02-21 DIAGNOSIS — Z7901 Long term (current) use of anticoagulants: Secondary | ICD-10-CM | POA: Diagnosis not present

## 2017-02-21 LAB — POCT INR: INR: 2.7

## 2017-02-22 DIAGNOSIS — H524 Presbyopia: Secondary | ICD-10-CM | POA: Diagnosis not present

## 2017-02-22 DIAGNOSIS — G43909 Migraine, unspecified, not intractable, without status migrainosus: Secondary | ICD-10-CM | POA: Diagnosis not present

## 2017-02-22 DIAGNOSIS — H2513 Age-related nuclear cataract, bilateral: Secondary | ICD-10-CM | POA: Diagnosis not present

## 2017-02-24 ENCOUNTER — Telehealth: Payer: Self-pay | Admitting: Neurology

## 2017-02-24 DIAGNOSIS — H5310 Unspecified subjective visual disturbances: Secondary | ICD-10-CM

## 2017-02-24 DIAGNOSIS — G451 Carotid artery syndrome (hemispheric): Secondary | ICD-10-CM

## 2017-02-24 NOTE — Telephone Encounter (Signed)
I called the number, the office is closed, I will try to call back tomorrow morning.

## 2017-02-24 NOTE — Telephone Encounter (Signed)
Please call Dr Jola Schmidt [phone] reg this mutual pt.

## 2017-02-28 ENCOUNTER — Ambulatory Visit (INDEPENDENT_AMBULATORY_CARE_PROVIDER_SITE_OTHER): Payer: Self-pay | Admitting: Internal Medicine

## 2017-02-28 DIAGNOSIS — Z952 Presence of prosthetic heart valve: Secondary | ICD-10-CM

## 2017-02-28 DIAGNOSIS — I69359 Hemiplegia and hemiparesis following cerebral infarction affecting unspecified side: Secondary | ICD-10-CM

## 2017-02-28 LAB — POCT INR: INR: 3.1

## 2017-02-28 NOTE — Telephone Encounter (Signed)
I called Dr. Valetta Close. This patient over the last 3 weeks or so and had visual field disturbances with C-shaped [place] lines. This could represent migraine, but the patient does have a history of cerebrovascular disease.  I will call the patient, the patient may require a CT the head and a carotid Doppler study.    I called patient. The patient indicates that over the last 3 weeks she has had several events averaging one a week where she believes one eye or the other is affected, she may see a C-shaped image that has squiggly lines, no bright colors.  The patient could potentially get a mild headache afterwards, she is not sure. She denies any other symptoms of speech changes, numbness or weakness.  She did have occasional migraines in the past that were activated by weather changes. The patient never had visual disturbances such as this.  I will check a CT of the head, and a carotid study.

## 2017-02-28 NOTE — Addendum Note (Signed)
Addended by: Kathrynn Ducking on: 02/28/2017 11:43 AM   Modules accepted: Orders

## 2017-03-01 ENCOUNTER — Telehealth: Payer: Self-pay | Admitting: Cardiovascular Disease

## 2017-03-01 NOTE — Telephone Encounter (Signed)
Looks like [name] patient

## 2017-03-01 NOTE — Telephone Encounter (Signed)
Representative from MD INR Patient calling to report INR reading of 3.1 for yesterday. Caller request that we contact patient,thanks.

## 2017-03-01 NOTE — Telephone Encounter (Signed)
See anticoag note from 02/28/17 for dosing and details.

## 2017-03-07 ENCOUNTER — Ambulatory Visit (INDEPENDENT_AMBULATORY_CARE_PROVIDER_SITE_OTHER): Payer: Medicare Other | Admitting: Cardiovascular Disease

## 2017-03-07 DIAGNOSIS — Z952 Presence of prosthetic heart valve: Secondary | ICD-10-CM

## 2017-03-07 DIAGNOSIS — I69359 Hemiplegia and hemiparesis following cerebral infarction affecting unspecified side: Secondary | ICD-10-CM

## 2017-03-07 LAB — POCT INR: INR: 3

## 2017-03-08 ENCOUNTER — Ambulatory Visit: Payer: Medicare Other | Admitting: Neurology

## 2017-03-08 DIAGNOSIS — Z952 Presence of prosthetic heart valve: Secondary | ICD-10-CM

## 2017-03-08 DIAGNOSIS — I69359 Hemiplegia and hemiparesis following cerebral infarction affecting unspecified side: Secondary | ICD-10-CM

## 2017-03-08 NOTE — Progress Notes (Signed)
This encounter was created in error - please disregard.

## 2017-03-10 ENCOUNTER — Ambulatory Visit: Payer: Medicare Other | Admitting: Neurology

## 2017-03-14 LAB — POCT INR: INR: 3.7

## 2017-03-15 ENCOUNTER — Ambulatory Visit (INDEPENDENT_AMBULATORY_CARE_PROVIDER_SITE_OTHER): Payer: Self-pay | Admitting: Internal Medicine

## 2017-03-15 DIAGNOSIS — I69359 Hemiplegia and hemiparesis following cerebral infarction affecting unspecified side: Secondary | ICD-10-CM

## 2017-03-15 DIAGNOSIS — Z952 Presence of prosthetic heart valve: Secondary | ICD-10-CM

## 2017-03-21 ENCOUNTER — Ambulatory Visit (INDEPENDENT_AMBULATORY_CARE_PROVIDER_SITE_OTHER): Payer: Medicare Other | Admitting: Cardiovascular Disease

## 2017-03-21 DIAGNOSIS — Z954 Presence of other heart-valve replacement: Secondary | ICD-10-CM | POA: Diagnosis not present

## 2017-03-21 DIAGNOSIS — I69359 Hemiplegia and hemiparesis following cerebral infarction affecting unspecified side: Secondary | ICD-10-CM

## 2017-03-21 DIAGNOSIS — Z952 Presence of prosthetic heart valve: Secondary | ICD-10-CM

## 2017-03-21 DIAGNOSIS — Z7901 Long term (current) use of anticoagulants: Secondary | ICD-10-CM | POA: Diagnosis not present

## 2017-03-21 LAB — POCT INR: INR: 4.2

## 2017-03-22 DIAGNOSIS — H43811 Vitreous degeneration, right eye: Secondary | ICD-10-CM | POA: Diagnosis not present

## 2017-03-23 DIAGNOSIS — M810 Age-related osteoporosis without current pathological fracture: Secondary | ICD-10-CM | POA: Diagnosis not present

## 2017-03-23 DIAGNOSIS — E7800 Pure hypercholesterolemia, unspecified: Secondary | ICD-10-CM | POA: Diagnosis not present

## 2017-03-23 DIAGNOSIS — R7301 Impaired fasting glucose: Secondary | ICD-10-CM | POA: Diagnosis not present

## 2017-03-23 DIAGNOSIS — I10 Essential (primary) hypertension: Secondary | ICD-10-CM | POA: Diagnosis not present

## 2017-03-24 DIAGNOSIS — I6789 Other cerebrovascular disease: Secondary | ICD-10-CM | POA: Diagnosis not present

## 2017-03-24 DIAGNOSIS — K219 Gastro-esophageal reflux disease without esophagitis: Secondary | ICD-10-CM | POA: Diagnosis not present

## 2017-03-24 DIAGNOSIS — I69998 Other sequelae following unspecified cerebrovascular disease: Secondary | ICD-10-CM | POA: Diagnosis not present

## 2017-03-24 DIAGNOSIS — Z1389 Encounter for screening for other disorder: Secondary | ICD-10-CM | POA: Diagnosis not present

## 2017-03-24 DIAGNOSIS — Z952 Presence of prosthetic heart valve: Secondary | ICD-10-CM | POA: Diagnosis not present

## 2017-03-24 DIAGNOSIS — I10 Essential (primary) hypertension: Secondary | ICD-10-CM | POA: Diagnosis not present

## 2017-03-24 DIAGNOSIS — R7301 Impaired fasting glucose: Secondary | ICD-10-CM | POA: Diagnosis not present

## 2017-03-24 DIAGNOSIS — M810 Age-related osteoporosis without current pathological fracture: Secondary | ICD-10-CM | POA: Diagnosis not present

## 2017-03-24 DIAGNOSIS — Z6821 Body mass index (BMI) 21.0-21.9, adult: Secondary | ICD-10-CM | POA: Diagnosis not present

## 2017-03-24 DIAGNOSIS — E7800 Pure hypercholesterolemia, unspecified: Secondary | ICD-10-CM | POA: Diagnosis not present

## 2017-03-24 DIAGNOSIS — Z0000 Encounter for general adult medical examination without abnormal findings: Secondary | ICD-10-CM | POA: Diagnosis not present

## 2017-03-24 DIAGNOSIS — Z7901 Long term (current) use of anticoagulants: Secondary | ICD-10-CM | POA: Diagnosis not present

## 2017-03-28 ENCOUNTER — Ambulatory Visit (HOSPITAL_COMMUNITY)
Admission: RE | Admit: 2017-03-28 | Discharge: 2017-03-28 | Disposition: A | Discharge status: Home / Self Care | Payer: Medicare Other | Source: Ambulatory Visit | Attending: Neurology | Admitting: Neurology

## 2017-03-28 ENCOUNTER — Ambulatory Visit (INDEPENDENT_AMBULATORY_CARE_PROVIDER_SITE_OTHER): Payer: Medicare Other | Admitting: Internal Medicine

## 2017-03-28 ENCOUNTER — Ambulatory Visit
Admission: RE | Admit: 2017-03-28 | Discharge: 2017-03-28 | Disposition: A | Discharge status: Home / Self Care | Payer: Medicare Other | Source: Ambulatory Visit | Attending: Neurology | Admitting: Neurology

## 2017-03-28 ENCOUNTER — Telehealth: Payer: Self-pay | Admitting: Neurology

## 2017-03-28 ENCOUNTER — Other Ambulatory Visit: Payer: Medicare Other

## 2017-03-28 DIAGNOSIS — I69359 Hemiplegia and hemiparesis following cerebral infarction affecting unspecified side: Secondary | ICD-10-CM

## 2017-03-28 DIAGNOSIS — G451 Carotid artery syndrome (hemispheric): Secondary | ICD-10-CM | POA: Diagnosis not present

## 2017-03-28 DIAGNOSIS — I779 Disorder of arteries and arterioles, unspecified: Secondary | ICD-10-CM | POA: Diagnosis not present

## 2017-03-28 DIAGNOSIS — H5310 Unspecified subjective visual disturbances: Secondary | ICD-10-CM | POA: Insufficient documentation

## 2017-03-28 DIAGNOSIS — I6523 Occlusion and stenosis of bilateral carotid arteries: Secondary | ICD-10-CM | POA: Insufficient documentation

## 2017-03-28 DIAGNOSIS — Z8673 Personal history of transient ischemic attack (TIA), and cerebral infarction without residual deficits: Secondary | ICD-10-CM | POA: Insufficient documentation

## 2017-03-28 DIAGNOSIS — Z952 Presence of prosthetic heart valve: Secondary | ICD-10-CM

## 2017-03-28 DIAGNOSIS — Z1212 Encounter for screening for malignant neoplasm of rectum: Secondary | ICD-10-CM | POA: Diagnosis not present

## 2017-03-28 LAB — VAS US CAROTID
LEFT ECA DIAS: -7 cm/s
LEFT VERTEBRAL DIAS: -9 cm/s
Left CCA dist dias: 15 cm/s
Left CCA dist sys: 58 cm/s
Left CCA prox dias: 12 cm/s
Left CCA prox sys: 51 cm/s
Left ICA dist dias: -20 cm/s
Left ICA dist sys: -65 cm/s
Left ICA prox dias: -15 cm/s
Left ICA prox sys: -55 cm/s
RIGHT ECA DIAS: -11 cm/s
RIGHT VERTEBRAL DIAS: 14 cm/s
Right CCA prox dias: 18 cm/s
Right CCA prox sys: 71 cm/s
Right cca dist sys: -58 cm/s

## 2017-03-28 LAB — POCT INR: INR: 2.4

## 2017-03-28 NOTE — Telephone Encounter (Signed)
I called the patient. The CT shows chronic changes involving the right frontoparietal area. No acute changes are seen. The patient is still having an occasional visual event, but they are alternating sides, sometimes on the left and sometimes on the right, with squiggly lines and C-shaped figures. I suspect that this is migraine equivalent, not TIA or seizures.  No medical therapy for now, the patient is to call if the events become frequent.   CT head 03/28/17:  IMPRESSION:  This is an abnormal CT scan of the head showing: 1.  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 There is an overlying craniotomy defect that is likely related.     2.   Multiple smaller strokes and chronic ischemic changes involving the cerebellum, left internal capsule and the deep white matter bilaterally. 3.   There are no acute findings.

## 2017-03-28 NOTE — Progress Notes (Signed)
VASCULAR LAB PRELIMINARY  PRELIMINARY  PRELIMINARY  PRELIMINARY  Carotid duplex completed.    Preliminary report:  1-39% ICA plaquing. Vertebral artery flow is antegrade.   Simrah Chatham, RVT 03/28/2017, 11:22 AM

## 2017-03-29 ENCOUNTER — Telehealth: Payer: Self-pay | Admitting: Neurology

## 2017-03-29 NOTE — Telephone Encounter (Signed)
I called patient. The carotid Doppler study is unremarkable, I discussed this with the patient.  Carotid doppler 03/29/17:  Summary: Bilateral: intimal wall thickening CCA. Mild mixed plaque origin ICA. 1-39% ICA plaquing. Vertebral artery flow is antegrade.

## 2017-04-05 ENCOUNTER — Ambulatory Visit (INDEPENDENT_AMBULATORY_CARE_PROVIDER_SITE_OTHER): Payer: Medicare Other | Admitting: Pharmacist

## 2017-04-05 DIAGNOSIS — I69359 Hemiplegia and hemiparesis following cerebral infarction affecting unspecified side: Secondary | ICD-10-CM

## 2017-04-05 DIAGNOSIS — Z952 Presence of prosthetic heart valve: Secondary | ICD-10-CM

## 2017-04-05 LAB — POCT INR: INR: 3

## 2017-04-11 LAB — POCT INR: INR: 3.4

## 2017-04-12 ENCOUNTER — Ambulatory Visit (INDEPENDENT_AMBULATORY_CARE_PROVIDER_SITE_OTHER): Payer: Self-pay | Admitting: Cardiology

## 2017-04-12 DIAGNOSIS — Z952 Presence of prosthetic heart valve: Secondary | ICD-10-CM

## 2017-04-12 DIAGNOSIS — I69359 Hemiplegia and hemiparesis following cerebral infarction affecting unspecified side: Secondary | ICD-10-CM

## 2017-04-15 DIAGNOSIS — R8279 Other abnormal findings on microbiological examination of urine: Secondary | ICD-10-CM | POA: Diagnosis not present

## 2017-04-15 DIAGNOSIS — N3001 Acute cystitis with hematuria: Secondary | ICD-10-CM | POA: Diagnosis not present

## 2017-04-18 ENCOUNTER — Ambulatory Visit (INDEPENDENT_AMBULATORY_CARE_PROVIDER_SITE_OTHER): Payer: Self-pay | Admitting: Internal Medicine

## 2017-04-18 DIAGNOSIS — Z7901 Long term (current) use of anticoagulants: Secondary | ICD-10-CM | POA: Diagnosis not present

## 2017-04-18 DIAGNOSIS — Z954 Presence of other heart-valve replacement: Secondary | ICD-10-CM | POA: Diagnosis not present

## 2017-04-18 DIAGNOSIS — I69359 Hemiplegia and hemiparesis following cerebral infarction affecting unspecified side: Secondary | ICD-10-CM

## 2017-04-18 DIAGNOSIS — Z952 Presence of prosthetic heart valve: Secondary | ICD-10-CM

## 2017-04-18 LAB — POCT INR: INR: 4.1

## 2017-04-19 ENCOUNTER — Telehealth: Payer: Self-pay | Admitting: Cardiovascular Disease

## 2017-04-19 NOTE — Telephone Encounter (Signed)
New message   [name] verbalized that is calling to give INR report 4.1 June 11

## 2017-04-19 NOTE — Telephone Encounter (Signed)
Please see coumadin encounter of yesterday June 11th

## 2017-04-22 ENCOUNTER — Ambulatory Visit (INDEPENDENT_AMBULATORY_CARE_PROVIDER_SITE_OTHER): Payer: Medicare Other | Admitting: Pharmacist

## 2017-04-22 DIAGNOSIS — I69359 Hemiplegia and hemiparesis following cerebral infarction affecting unspecified side: Secondary | ICD-10-CM

## 2017-04-22 DIAGNOSIS — Z952 Presence of prosthetic heart valve: Secondary | ICD-10-CM

## 2017-04-22 LAB — POCT INR: INR: 2.1

## 2017-04-25 DIAGNOSIS — N3001 Acute cystitis with hematuria: Secondary | ICD-10-CM | POA: Diagnosis not present

## 2017-05-02 ENCOUNTER — Ambulatory Visit (INDEPENDENT_AMBULATORY_CARE_PROVIDER_SITE_OTHER): Payer: Self-pay | Admitting: Pharmacist

## 2017-05-02 DIAGNOSIS — Z952 Presence of prosthetic heart valve: Secondary | ICD-10-CM

## 2017-05-02 DIAGNOSIS — I69359 Hemiplegia and hemiparesis following cerebral infarction affecting unspecified side: Secondary | ICD-10-CM

## 2017-05-02 LAB — POCT INR: INR: 2.9

## 2017-05-09 ENCOUNTER — Ambulatory Visit (INDEPENDENT_AMBULATORY_CARE_PROVIDER_SITE_OTHER): Payer: Self-pay | Admitting: Internal Medicine

## 2017-05-09 DIAGNOSIS — I69359 Hemiplegia and hemiparesis following cerebral infarction affecting unspecified side: Secondary | ICD-10-CM

## 2017-05-09 DIAGNOSIS — Z952 Presence of prosthetic heart valve: Secondary | ICD-10-CM

## 2017-05-09 LAB — POCT INR: INR: 2.6

## 2017-05-16 ENCOUNTER — Ambulatory Visit (INDEPENDENT_AMBULATORY_CARE_PROVIDER_SITE_OTHER): Payer: Medicare Other | Admitting: Cardiovascular Disease

## 2017-05-16 DIAGNOSIS — B078 Other viral warts: Secondary | ICD-10-CM | POA: Diagnosis not present

## 2017-05-16 DIAGNOSIS — Z1283 Encounter for screening for malignant neoplasm of skin: Secondary | ICD-10-CM | POA: Diagnosis not present

## 2017-05-16 DIAGNOSIS — Z952 Presence of prosthetic heart valve: Secondary | ICD-10-CM

## 2017-05-16 DIAGNOSIS — I69359 Hemiplegia and hemiparesis following cerebral infarction affecting unspecified side: Secondary | ICD-10-CM

## 2017-05-16 LAB — POCT INR: INR: 4.5

## 2017-05-17 ENCOUNTER — Telehealth: Payer: Self-pay | Admitting: Cardiovascular Disease

## 2017-05-17 NOTE — Telephone Encounter (Signed)
We received INR yesterday that was out of range and Pharmacist talked with patient.

## 2017-05-17 NOTE — Telephone Encounter (Signed)
Close encounter 

## 2017-05-17 NOTE — Telephone Encounter (Signed)
Spoke with [name] and informed her that pt was dosed yesterday and she gave [place] her the result.

## 2017-05-17 NOTE — Telephone Encounter (Signed)
[name] [facility]) is calling with an out of range INR for Mrs. [name] . Please call .Marland Kitchen Thanks

## 2017-05-17 NOTE — Telephone Encounter (Signed)
New Message     Out of range INR

## 2017-05-20 DIAGNOSIS — H2513 Age-related nuclear cataract, bilateral: Secondary | ICD-10-CM | POA: Diagnosis not present

## 2017-05-23 ENCOUNTER — Ambulatory Visit (INDEPENDENT_AMBULATORY_CARE_PROVIDER_SITE_OTHER): Payer: Medicare Other | Admitting: Cardiology

## 2017-05-23 DIAGNOSIS — I69359 Hemiplegia and hemiparesis following cerebral infarction affecting unspecified side: Secondary | ICD-10-CM

## 2017-05-23 DIAGNOSIS — Z7901 Long term (current) use of anticoagulants: Secondary | ICD-10-CM | POA: Diagnosis not present

## 2017-05-23 DIAGNOSIS — Z954 Presence of other heart-valve replacement: Secondary | ICD-10-CM | POA: Diagnosis not present

## 2017-05-23 DIAGNOSIS — Z952 Presence of prosthetic heart valve: Secondary | ICD-10-CM

## 2017-05-23 LAB — POCT INR: INR: 3

## 2017-05-30 ENCOUNTER — Ambulatory Visit (INDEPENDENT_AMBULATORY_CARE_PROVIDER_SITE_OTHER): Payer: Medicare Other | Admitting: Interventional Cardiology

## 2017-05-30 DIAGNOSIS — I69359 Hemiplegia and hemiparesis following cerebral infarction affecting unspecified side: Secondary | ICD-10-CM

## 2017-05-30 DIAGNOSIS — Z952 Presence of prosthetic heart valve: Secondary | ICD-10-CM

## 2017-05-30 LAB — POCT INR: INR: 4.2

## 2017-06-06 ENCOUNTER — Telehealth: Payer: Self-pay | Admitting: Cardiovascular Disease

## 2017-06-06 ENCOUNTER — Ambulatory Visit (INDEPENDENT_AMBULATORY_CARE_PROVIDER_SITE_OTHER): Payer: Self-pay | Admitting: Internal Medicine

## 2017-06-06 DIAGNOSIS — Z952 Presence of prosthetic heart valve: Secondary | ICD-10-CM

## 2017-06-06 DIAGNOSIS — I69359 Hemiplegia and hemiparesis following cerebral infarction affecting unspecified side: Secondary | ICD-10-CM

## 2017-06-06 LAB — POCT INR: INR: 3.1

## 2017-06-06 NOTE — Telephone Encounter (Signed)
Pt called with INR result see anticoagulation encounter.

## 2017-06-06 NOTE — Telephone Encounter (Signed)
New message    [facility] is calling to report an out of range INR. INR is 3.1

## 2017-06-13 ENCOUNTER — Ambulatory Visit (INDEPENDENT_AMBULATORY_CARE_PROVIDER_SITE_OTHER): Payer: Self-pay | Admitting: Internal Medicine

## 2017-06-13 DIAGNOSIS — Z952 Presence of prosthetic heart valve: Secondary | ICD-10-CM

## 2017-06-13 DIAGNOSIS — I69359 Hemiplegia and hemiparesis following cerebral infarction affecting unspecified side: Secondary | ICD-10-CM

## 2017-06-13 LAB — POCT INR: INR: 3.5

## 2017-06-20 ENCOUNTER — Ambulatory Visit (INDEPENDENT_AMBULATORY_CARE_PROVIDER_SITE_OTHER): Payer: Medicare Other | Admitting: Pharmacist

## 2017-06-20 DIAGNOSIS — Z954 Presence of other heart-valve replacement: Secondary | ICD-10-CM | POA: Diagnosis not present

## 2017-06-20 DIAGNOSIS — I69359 Hemiplegia and hemiparesis following cerebral infarction affecting unspecified side: Secondary | ICD-10-CM

## 2017-06-20 DIAGNOSIS — Z952 Presence of prosthetic heart valve: Secondary | ICD-10-CM

## 2017-06-20 DIAGNOSIS — Z7901 Long term (current) use of anticoagulants: Secondary | ICD-10-CM | POA: Diagnosis not present

## 2017-06-20 LAB — PROTIME-INR: INR: 4.5 — AB (ref 0.9–1.1)

## 2017-06-27 ENCOUNTER — Ambulatory Visit (INDEPENDENT_AMBULATORY_CARE_PROVIDER_SITE_OTHER): Payer: Medicare Other | Admitting: Internal Medicine

## 2017-06-27 DIAGNOSIS — Z952 Presence of prosthetic heart valve: Secondary | ICD-10-CM

## 2017-06-27 DIAGNOSIS — I69359 Hemiplegia and hemiparesis following cerebral infarction affecting unspecified side: Secondary | ICD-10-CM

## 2017-06-27 LAB — POCT INR: INR: 3.8

## 2017-07-01 ENCOUNTER — Encounter: Payer: Self-pay | Admitting: Pharmacist

## 2017-07-04 ENCOUNTER — Ambulatory Visit (INDEPENDENT_AMBULATORY_CARE_PROVIDER_SITE_OTHER): Payer: Medicare Other | Admitting: Cardiovascular Disease

## 2017-07-04 DIAGNOSIS — I69359 Hemiplegia and hemiparesis following cerebral infarction affecting unspecified side: Secondary | ICD-10-CM

## 2017-07-04 DIAGNOSIS — Z952 Presence of prosthetic heart valve: Secondary | ICD-10-CM

## 2017-07-04 LAB — POCT INR: INR: 3.2

## 2017-07-06 DIAGNOSIS — B078 Other viral warts: Secondary | ICD-10-CM | POA: Diagnosis not present

## 2017-07-11 LAB — PROTIME-INR: INR: 4.4 — AB (ref 0.9–1.1)

## 2017-07-12 ENCOUNTER — Ambulatory Visit (INDEPENDENT_AMBULATORY_CARE_PROVIDER_SITE_OTHER): Payer: Medicare Other

## 2017-07-12 DIAGNOSIS — Z952 Presence of prosthetic heart valve: Secondary | ICD-10-CM

## 2017-07-12 DIAGNOSIS — Z5181 Encounter for therapeutic drug level monitoring: Secondary | ICD-10-CM | POA: Diagnosis not present

## 2017-07-12 DIAGNOSIS — I69359 Hemiplegia and hemiparesis following cerebral infarction affecting unspecified side: Secondary | ICD-10-CM

## 2017-07-12 DIAGNOSIS — Z7901 Long term (current) use of anticoagulants: Secondary | ICD-10-CM | POA: Insufficient documentation

## 2017-07-18 ENCOUNTER — Telehealth: Payer: Self-pay | Admitting: Cardiovascular Disease

## 2017-07-18 ENCOUNTER — Ambulatory Visit (INDEPENDENT_AMBULATORY_CARE_PROVIDER_SITE_OTHER): Payer: Medicare Other | Admitting: Cardiology

## 2017-07-18 DIAGNOSIS — I69359 Hemiplegia and hemiparesis following cerebral infarction affecting unspecified side: Secondary | ICD-10-CM | POA: Diagnosis not present

## 2017-07-18 DIAGNOSIS — Z7901 Long term (current) use of anticoagulants: Secondary | ICD-10-CM

## 2017-07-18 DIAGNOSIS — Z954 Presence of other heart-valve replacement: Secondary | ICD-10-CM | POA: Diagnosis not present

## 2017-07-18 DIAGNOSIS — Z952 Presence of prosthetic heart valve: Secondary | ICD-10-CM

## 2017-07-18 LAB — POCT INR: INR: 3.9

## 2017-07-18 NOTE — Telephone Encounter (Signed)
Close encounter 

## 2017-07-25 ENCOUNTER — Ambulatory Visit (INDEPENDENT_AMBULATORY_CARE_PROVIDER_SITE_OTHER): Payer: Medicare Other | Admitting: Cardiology

## 2017-07-25 DIAGNOSIS — Z7901 Long term (current) use of anticoagulants: Secondary | ICD-10-CM

## 2017-07-25 DIAGNOSIS — Z952 Presence of prosthetic heart valve: Secondary | ICD-10-CM | POA: Diagnosis not present

## 2017-07-25 DIAGNOSIS — I69359 Hemiplegia and hemiparesis following cerebral infarction affecting unspecified side: Secondary | ICD-10-CM

## 2017-07-25 LAB — POCT INR: INR: 2.4

## 2017-08-01 ENCOUNTER — Ambulatory Visit (INDEPENDENT_AMBULATORY_CARE_PROVIDER_SITE_OTHER): Payer: Medicare Other | Admitting: Internal Medicine

## 2017-08-01 DIAGNOSIS — I69359 Hemiplegia and hemiparesis following cerebral infarction affecting unspecified side: Secondary | ICD-10-CM

## 2017-08-01 DIAGNOSIS — Z7901 Long term (current) use of anticoagulants: Secondary | ICD-10-CM

## 2017-08-01 DIAGNOSIS — Z952 Presence of prosthetic heart valve: Secondary | ICD-10-CM | POA: Diagnosis not present

## 2017-08-01 LAB — POCT INR: INR: 3.5

## 2017-08-06 DIAGNOSIS — Z23 Encounter for immunization: Secondary | ICD-10-CM | POA: Diagnosis not present

## 2017-08-08 ENCOUNTER — Ambulatory Visit (INDEPENDENT_AMBULATORY_CARE_PROVIDER_SITE_OTHER): Payer: Medicare Other | Admitting: Cardiovascular Disease

## 2017-08-08 DIAGNOSIS — I69359 Hemiplegia and hemiparesis following cerebral infarction affecting unspecified side: Secondary | ICD-10-CM

## 2017-08-08 DIAGNOSIS — Z7901 Long term (current) use of anticoagulants: Secondary | ICD-10-CM

## 2017-08-08 DIAGNOSIS — Z5181 Encounter for therapeutic drug level monitoring: Secondary | ICD-10-CM | POA: Diagnosis not present

## 2017-08-08 DIAGNOSIS — Z952 Presence of prosthetic heart valve: Secondary | ICD-10-CM

## 2017-08-08 LAB — POCT INR: INR: 3.6

## 2017-08-15 ENCOUNTER — Ambulatory Visit (INDEPENDENT_AMBULATORY_CARE_PROVIDER_SITE_OTHER): Payer: Medicare Other | Admitting: Internal Medicine

## 2017-08-15 DIAGNOSIS — Z952 Presence of prosthetic heart valve: Secondary | ICD-10-CM

## 2017-08-15 DIAGNOSIS — Z7901 Long term (current) use of anticoagulants: Secondary | ICD-10-CM | POA: Diagnosis not present

## 2017-08-15 DIAGNOSIS — Z5181 Encounter for therapeutic drug level monitoring: Secondary | ICD-10-CM | POA: Diagnosis not present

## 2017-08-15 DIAGNOSIS — I69359 Hemiplegia and hemiparesis following cerebral infarction affecting unspecified side: Secondary | ICD-10-CM

## 2017-08-15 DIAGNOSIS — Z954 Presence of other heart-valve replacement: Secondary | ICD-10-CM | POA: Diagnosis not present

## 2017-08-15 LAB — POCT INR: INR: 2.2

## 2017-08-22 ENCOUNTER — Ambulatory Visit (INDEPENDENT_AMBULATORY_CARE_PROVIDER_SITE_OTHER): Payer: Medicare Other | Admitting: Cardiovascular Disease

## 2017-08-22 DIAGNOSIS — Z952 Presence of prosthetic heart valve: Secondary | ICD-10-CM | POA: Diagnosis not present

## 2017-08-22 DIAGNOSIS — Z5181 Encounter for therapeutic drug level monitoring: Secondary | ICD-10-CM | POA: Diagnosis not present

## 2017-08-22 DIAGNOSIS — I69359 Hemiplegia and hemiparesis following cerebral infarction affecting unspecified side: Secondary | ICD-10-CM

## 2017-08-22 DIAGNOSIS — Z7901 Long term (current) use of anticoagulants: Secondary | ICD-10-CM

## 2017-08-22 LAB — POCT INR: INR: 2.7

## 2017-08-23 ENCOUNTER — Encounter: Payer: Self-pay | Admitting: Physician Assistant

## 2017-08-23 ENCOUNTER — Ambulatory Visit (INDEPENDENT_AMBULATORY_CARE_PROVIDER_SITE_OTHER): Payer: Medicare Other | Admitting: Cardiology

## 2017-08-23 VITALS — BP 120/70 | HR 77 | Ht 67.0 in | Wt 131.1 lb

## 2017-08-23 DIAGNOSIS — Z952 Presence of prosthetic heart valve: Secondary | ICD-10-CM | POA: Diagnosis not present

## 2017-08-23 DIAGNOSIS — E785 Hyperlipidemia, unspecified: Secondary | ICD-10-CM | POA: Diagnosis not present

## 2017-08-23 DIAGNOSIS — I10 Essential (primary) hypertension: Secondary | ICD-10-CM

## 2017-08-23 DIAGNOSIS — I69359 Hemiplegia and hemiparesis following cerebral infarction affecting unspecified side: Secondary | ICD-10-CM

## 2017-08-23 DIAGNOSIS — Z7901 Long term (current) use of anticoagulants: Secondary | ICD-10-CM

## 2017-08-23 NOTE — Patient Instructions (Addendum)
Medication Instructions:  Your physician recommends that you continue on your current medications as directed. Please refer to the Current Medication list given to you today.   Labwork: NONE ORDERED TODAY  Testing/Procedures: NONE ORDERED TODAY  Follow-Up: Your physician wants you to follow-up in: 6 MONTHS WITH DR. NAHSER You will receive a reminder letter in the mail two months in advance. If you don't receive a letter, please call our office to schedule the follow-up appointment.   Any Other Special Instructions Will Be Listed Below (If Applicable).     If you need a refill on your cardiac medications before your next appointment, please call your pharmacy.   

## 2017-08-23 NOTE — Progress Notes (Signed)
Cardiology Office Note:    Date:  08/23/2017   ID:  [name], DOB 1945-12-16, MRN [id number]  PCP:  Haywood Pao, MD  Cardiologist:  Dr. Liam Rogers  Referring MD: Osborne Casco [name], MD   Chief Complaint  Patient presents with  . Follow-up    Mechanical mitral valve   twice a year   History of Present Illness:    [name] is a 71 y.o. female with a past medical history significant for History of mechanical mitral valve replacement at age 74 (in [place]), hypertension, diabetes, prior stroke X3. She moved to [facility] and establish care with Dr. Acie Fredrickson in 2016.  The patient is here alone for follow up. She is feeling well. She walks 1 mile every evening either in her neighborhood or at the gym and has no exertional chest pain or shortness of breath. She also denies lightheadedness, syncope/near-syncope, palpitations, orthopnea, PND or edema. She is compliant with her anticoagulant therapy. She has home INR testing and tests every Monday. Her last 2  INR levels have been subtherapeutic (2.2 and 2.7) and she has made adjustments as guided by the Coumadin clinic. She tries to control her diet for vitamin K. She has recently been taking cranberry capsules for UTI prophylaxis, but no other herbal supplements.  The patient has a history of multiple strokes and has left-sided weakness. She uses a cane. She denies any recent neurologic changes.  Past Medical History:  Diagnosis Date  . Abnormality of gait 09/07/2016  . Allergy   . Diabetes mellitus without complication [facility])    Patient denies this - notes history of glucose intolerance  . Hemiparesis and alteration of sensations as late effects of stroke ([place]) 09/07/2016  . History of pneumonia 1997  . S/P MVR (mitral valve replacement)    Mechanical mitral valve replacement at age 31 (done in [place])  // echo 7/17: EF 55-60, normal wall motion, bileaflet mechanical mitral valve prosthesis functioning normally, mild LAE,  mildly reduced RVSF, small pericardial effusion  . Stroke [facility]) 1997, 2013, 2015    Past Surgical History:  Procedure Laterality Date  . ABDOMINAL HYSTERECTOMY    . BRAIN SURGERY    . BUNIONECTOMY  1993  . CARDIAC VALVE REPLACEMENT  1997  . TOE SURGERY  1996    Current Medications: Current Meds  Medication Sig  . AMLODIPINE BESYLATE PO Take 10 mg by mouth daily.  Marland Kitchen aspirin EC 81 MG tablet Take 81 mg by mouth daily.   Marland Kitchen atorvastatin (LIPITOR) 20 MG tablet Take 20 mg by mouth daily.  . benzonatate (TESSALON) 100 MG capsule Take 100 mg by mouth as needed for cough.  . calcium carbonate 1250 MG capsule Take 1,200 mg by mouth 2 (two) times daily with a meal.  . cholecalciferol (VITAMIN D) 1000 UNITS tablet Take 2,000 Units by mouth daily. TAKE TWO TABS BY MOUTH DAILY  . CRANBERRY PO Take 1 tablet by mouth at bedtime.  Marland Kitchen denosumab (PROLIA) 60 MG/ML SOLN injection Inject 60 mg into the skin every 6 (six) months. Administer in upper arm, thigh, or abdomen  . docusate sodium (COLACE) 100 MG capsule Take 100 mg by mouth daily.   Marland Kitchen omeprazole (PRILOSEC) 20 MG capsule Take 20 mg by mouth daily.  Marland Kitchen warfarin (COUMADIN) 5 MG tablet Take 5 mg by mouth as directed.      Allergies:   Zoloft [sertraline]   Social History   Social History  . Marital status: Married  Spouse name: N/A  . Number of children: 2  . 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 Retired    Social History Main Topics  . Smoking status: Never Smoker  . Smokeless tobacco: Never Used  . Alcohol use No  . Drug use: No  . Sexual activity: Not Asked     Comment: Married   Other Topics Concern  . None   Social History Narrative   Lives at home w/ her husband   Right-handed   Caffeine: none     Family History: The patient's family history includes Cancer in her mother; Heart disease in her mother; Hyperlipidemia in her mother; Hypertension in her father and mother; Stroke in her father.  There is no history of Heart attack. ROS:   Please see the history of present illness.     All other systems reviewed and are negative.  EKGs/Labs/Other Studies Reviewed:    The following studies were reviewed today:  Echocardiogram 05/12/2016 Study Conclusions - Left ventricle: The cavity size was normal. Wall thickness was   normal. Systolic function was normal. The estimated ejection   fraction was in the range of 55% to 60%. Wall motion was normal;   there were no regional wall motion abnormalities. The study is   not technically sufficient to allow evaluation of LV diastolic   function. - Ventricular septum: Septal motion showed paradox. - Mitral valve: A bileaflet mechanical prosthesis was present and   functioning normally. - Left atrium: The atrium was mildly dilated. - Right ventricle: Systolic function was mildly reduced. - Pericardium, extracardiac: A small pericardial effusion was   identified circumferential to the heart. There was no evidence of   hemodynamic compromise.  EKG:  EKG is  ordered today.  The ekg ordered today demonstrates normal sinus rhythm with first-degree AV block and occasional PVCs. PRI 240, no significant changes from previous  Recent Labs: No results found for requested labs within last 8760 hours.   Recent Lipid Panel No results found for: CHOL, TRIG, HDL, CHOLHDL, VLDL, LDLCALC, LDLDIRECT  Physical Exam:    VS:  BP 120/70   Pulse 77   Ht 5\' 7"  (1.702 m)   Wt 131 lb 1.9 oz (59.5 kg)   BMI 20.54 kg/m     Wt Readings from Last 3 Encounters:  08/23/17 131 lb 1.9 oz (59.5 kg)  02/11/17 133 lb (60.3 kg)  02/10/17 133 lb (60.3 kg)     Physical Exam  Constitutional: She is oriented to person, place, and time. She appears well-developed and well-nourished. No distress.  HENT:  Head: Normocephalic and atraumatic.  Neck: Normal range of motion. Neck supple. No JVD present.  Cardiovascular: Normal rate, regular rhythm and normal heart  sounds.  Exam reveals no gallop and no friction rub.   No murmur heard. Crisp mechanical valve click  Pulmonary/Chest: Effort normal and breath sounds normal.  Abdominal: Soft. Bowel sounds are normal. She exhibits no distension. There is no tenderness.  Musculoskeletal: Normal range of motion. She exhibits no edema.  Left sided weakness, using a cane  Neurological: She is alert and oriented to person, place, and time.  Skin: Skin is warm and dry.  Psychiatric: She has a normal mood and affect. Her behavior is normal. Thought content normal.     ASSESSMENT:    1. Essential hypertension   2. H/O mitral valve replacement with mechanical valve   3. Long term (current) use of anticoagulants  4. 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 Hyperlipidemia, unspecified hyperlipidemia type    PLAN:    In order of problems listed above:  1. Hx of mitral valve replacement with mechanical valve (1997): Patient has crisp valve click, no murmur. She is having no hypoperfusion-type symptoms. Echo in 7/17 demonstrated normally functioning mechanical mitral valve. She is on Coumadin for valve maintenance. INR goal 3.0-3.5. She checks her INR at home every Monday and adjustments are made per our Coumadin clinic. She says that her INR is up and down. She tries to control her diet. She has been on cranberry capsules for the last 2 months for UTI prevention. We discussed this and think she should try going without the cranberry to see if her INRs are easier to control. He denies any other herbal supplements.  Continue SBE prophylaxis.  2. Essential hypertension: Blood pressure is well controlled on current regimen  3. Hemiparesis (mild left sided) due to old stroke: Patient has history of multiple strokes, 1997, 2013, 2015. She is followed every 6 months by neurology, Dr. Jannifer Franklin. No new neurologic symptoms.  4. Hyperlipidemia: Treated with atorvastatin 20 mg daily. Managed by her PCP Dr.  Osborne Casco.    Medication Adjustments/Labs and Tests Ordered: Current medicines are reviewed at length with the patient today.  Concerns regarding medicines are outlined above. Labs and tests ordered and medication changes are outlined in the patient instructions below:  Patient Instructions   Medication Instructions:  Your physician recommends that you continue on your current medications as directed. Please refer to the Current Medication list given to you today.   Labwork: NONE ORDERED TODAY  Testing/Procedures: NONE ORDERED TODAY  Follow-Up: Your physician wants you to follow-up in: [place] DR. Acie Fredrickson You will receive a reminder letter in the mail two months in advance. If you don't receive a letter, please call our office to schedule the follow-up appointment.   Any Other Special Instructions Will Be Listed Below (If Applicable).     If you need a refill on your cardiac medications before your next appointment, please call your pharmacy.      Signed, Daune Perch, NP  08/23/2017 1:16 PM    [facility]

## 2017-08-29 ENCOUNTER — Ambulatory Visit (INDEPENDENT_AMBULATORY_CARE_PROVIDER_SITE_OTHER): Payer: Medicare Other

## 2017-08-29 DIAGNOSIS — Z952 Presence of prosthetic heart valve: Secondary | ICD-10-CM | POA: Diagnosis not present

## 2017-08-29 DIAGNOSIS — I69359 Hemiplegia and hemiparesis following cerebral infarction affecting unspecified side: Secondary | ICD-10-CM | POA: Diagnosis not present

## 2017-08-29 DIAGNOSIS — Z5181 Encounter for therapeutic drug level monitoring: Secondary | ICD-10-CM | POA: Diagnosis not present

## 2017-08-29 DIAGNOSIS — Z7901 Long term (current) use of anticoagulants: Secondary | ICD-10-CM

## 2017-08-29 LAB — POCT INR: INR: 4.1

## 2017-09-05 ENCOUNTER — Ambulatory Visit (INDEPENDENT_AMBULATORY_CARE_PROVIDER_SITE_OTHER): Payer: Medicare Other | Admitting: Internal Medicine

## 2017-09-05 DIAGNOSIS — Z5181 Encounter for therapeutic drug level monitoring: Secondary | ICD-10-CM | POA: Diagnosis not present

## 2017-09-05 DIAGNOSIS — I69359 Hemiplegia and hemiparesis following cerebral infarction affecting unspecified side: Secondary | ICD-10-CM

## 2017-09-05 DIAGNOSIS — Z952 Presence of prosthetic heart valve: Secondary | ICD-10-CM

## 2017-09-05 DIAGNOSIS — Z7901 Long term (current) use of anticoagulants: Secondary | ICD-10-CM

## 2017-09-05 LAB — POCT INR: INR: 3.6

## 2017-09-12 ENCOUNTER — Ambulatory Visit (INDEPENDENT_AMBULATORY_CARE_PROVIDER_SITE_OTHER): Payer: Medicare Other

## 2017-09-12 DIAGNOSIS — Z5181 Encounter for therapeutic drug level monitoring: Secondary | ICD-10-CM | POA: Diagnosis not present

## 2017-09-12 DIAGNOSIS — Z954 Presence of other heart-valve replacement: Secondary | ICD-10-CM | POA: Diagnosis not present

## 2017-09-12 DIAGNOSIS — Z7901 Long term (current) use of anticoagulants: Secondary | ICD-10-CM | POA: Diagnosis not present

## 2017-09-12 DIAGNOSIS — I69359 Hemiplegia and hemiparesis following cerebral infarction affecting unspecified side: Secondary | ICD-10-CM | POA: Diagnosis not present

## 2017-09-12 DIAGNOSIS — Z952 Presence of prosthetic heart valve: Secondary | ICD-10-CM | POA: Diagnosis not present

## 2017-09-12 LAB — POCT INR: INR: 4

## 2017-09-14 DIAGNOSIS — H43811 Vitreous degeneration, right eye: Secondary | ICD-10-CM | POA: Diagnosis not present

## 2017-09-19 ENCOUNTER — Ambulatory Visit (INDEPENDENT_AMBULATORY_CARE_PROVIDER_SITE_OTHER): Payer: Medicare Other | Admitting: Internal Medicine

## 2017-09-19 DIAGNOSIS — Z5181 Encounter for therapeutic drug level monitoring: Secondary | ICD-10-CM | POA: Diagnosis not present

## 2017-09-19 DIAGNOSIS — Z7901 Long term (current) use of anticoagulants: Secondary | ICD-10-CM

## 2017-09-19 DIAGNOSIS — Z952 Presence of prosthetic heart valve: Secondary | ICD-10-CM

## 2017-09-19 DIAGNOSIS — I69359 Hemiplegia and hemiparesis following cerebral infarction affecting unspecified side: Secondary | ICD-10-CM

## 2017-09-19 LAB — POCT INR: INR: 2.7

## 2017-09-21 DIAGNOSIS — Z7901 Long term (current) use of anticoagulants: Secondary | ICD-10-CM | POA: Diagnosis not present

## 2017-09-21 DIAGNOSIS — Z6820 Body mass index (BMI) 20.0-20.9, adult: Secondary | ICD-10-CM | POA: Diagnosis not present

## 2017-09-21 DIAGNOSIS — I10 Essential (primary) hypertension: Secondary | ICD-10-CM | POA: Diagnosis not present

## 2017-09-21 DIAGNOSIS — M810 Age-related osteoporosis without current pathological fracture: Secondary | ICD-10-CM | POA: Diagnosis not present

## 2017-09-21 DIAGNOSIS — Z952 Presence of prosthetic heart valve: Secondary | ICD-10-CM | POA: Diagnosis not present

## 2017-09-21 DIAGNOSIS — I6789 Other cerebrovascular disease: Secondary | ICD-10-CM | POA: Diagnosis not present

## 2017-09-21 DIAGNOSIS — E7800 Pure hypercholesterolemia, unspecified: Secondary | ICD-10-CM | POA: Diagnosis not present

## 2017-09-21 DIAGNOSIS — K219 Gastro-esophageal reflux disease without esophagitis: Secondary | ICD-10-CM | POA: Diagnosis not present

## 2017-09-21 DIAGNOSIS — R7301 Impaired fasting glucose: Secondary | ICD-10-CM | POA: Diagnosis not present

## 2017-09-21 DIAGNOSIS — I69998 Other sequelae following unspecified cerebrovascular disease: Secondary | ICD-10-CM | POA: Diagnosis not present

## 2017-09-21 DIAGNOSIS — D692 Other nonthrombocytopenic purpura: Secondary | ICD-10-CM | POA: Diagnosis not present

## 2017-09-24 ENCOUNTER — Observation Stay (HOSPITAL_COMMUNITY): Payer: Medicare Other

## 2017-09-24 ENCOUNTER — Observation Stay (HOSPITAL_COMMUNITY)
Admission: EM | Admit: 2017-09-24 | Discharge: 2017-09-24 | Disposition: A | Discharge status: Home / Self Care | Payer: Medicare Other | Attending: Internal Medicine | Admitting: Internal Medicine

## 2017-09-24 ENCOUNTER — Other Ambulatory Visit: Payer: Self-pay

## 2017-09-24 ENCOUNTER — Emergency Department (HOSPITAL_COMMUNITY): Payer: Medicare Other

## 2017-09-24 ENCOUNTER — Encounter (HOSPITAL_COMMUNITY): Payer: Self-pay

## 2017-09-24 DIAGNOSIS — Z8673 Personal history of transient ischemic attack (TIA), and cerebral infarction without residual deficits: Secondary | ICD-10-CM

## 2017-09-24 DIAGNOSIS — Z7982 Long term (current) use of aspirin: Secondary | ICD-10-CM | POA: Diagnosis not present

## 2017-09-24 DIAGNOSIS — I639 Cerebral infarction, unspecified: Secondary | ICD-10-CM | POA: Diagnosis not present

## 2017-09-24 DIAGNOSIS — Z7901 Long term (current) use of anticoagulants: Secondary | ICD-10-CM | POA: Insufficient documentation

## 2017-09-24 DIAGNOSIS — I10 Essential (primary) hypertension: Secondary | ICD-10-CM | POA: Diagnosis not present

## 2017-09-24 DIAGNOSIS — R42 Dizziness and giddiness: Secondary | ICD-10-CM | POA: Diagnosis not present

## 2017-09-24 DIAGNOSIS — E119 Type 2 diabetes mellitus without complications: Secondary | ICD-10-CM | POA: Diagnosis not present

## 2017-09-24 DIAGNOSIS — E785 Hyperlipidemia, unspecified: Secondary | ICD-10-CM | POA: Diagnosis not present

## 2017-09-24 DIAGNOSIS — I509 Heart failure, unspecified: Secondary | ICD-10-CM | POA: Diagnosis not present

## 2017-09-24 DIAGNOSIS — Z952 Presence of prosthetic heart valve: Secondary | ICD-10-CM

## 2017-09-24 DIAGNOSIS — H8110 Benign paroxysmal vertigo, unspecified ear: Principal | ICD-10-CM

## 2017-09-24 LAB — COMPREHENSIVE METABOLIC PANEL
ALT: 23 U/L (ref 14–54)
AST: 24 U/L (ref 15–41)
Albumin: 4.4 g/dL (ref 3.5–5.0)
Alkaline Phosphatase: 83 U/L (ref 38–126)
Anion gap: 7 (ref 5–15)
BUN: 28 mg/dL — ABNORMAL HIGH (ref 6–20)
CO2: 28 mmol/L (ref 22–32)
Calcium: 9.8 mg/dL (ref 8.9–10.3)
Chloride: 106 mmol/L (ref 101–111)
Creatinine, Ser: 0.75 mg/dL (ref 0.44–1.00)
GFR calc Af Amer: >60 mL/min (ref >60)
GFR calc non Af Amer: >60 mL/min (ref >60)
Glucose, Bld: 133 mg/dL — ABNORMAL HIGH (ref 65–99)
Potassium: 3.6 mmol/L (ref 3.5–5.1)
Sodium: 141 mmol/L (ref 135–145)
Total Bilirubin: 0.5 mg/dL (ref 0.3–1.2)
Total Protein: 7.5 g/dL (ref 6.5–8.1)

## 2017-09-24 LAB — URINALYSIS, ROUTINE W REFLEX MICROSCOPIC
Bacteria, UA: NONE SEEN
Bilirubin Urine: NEGATIVE
Glucose, UA: NEGATIVE mg/dL
Ketones, ur: NEGATIVE mg/dL
Leukocytes, UA: NEGATIVE
Nitrite: NEGATIVE
Protein, ur: NEGATIVE mg/dL
Specific Gravity, Urine: 1.006 (ref 1.005–1.030)
Squamous Epithelial / LPF: NONE SEEN
pH: 7 (ref 5.0–8.0)

## 2017-09-24 LAB — DIFFERENTIAL
Basophils Absolute: 0 10*3/uL (ref 0.0–0.1)
Basophils Relative: 1 %
Eosinophils Absolute: 0.1 10*3/uL (ref 0.0–0.7)
Eosinophils Relative: 1 %
Lymphocytes Relative: 38 %
Lymphs Abs: 1.6 10*3/uL (ref 0.7–4.0)
Monocytes Absolute: 0.3 10*3/uL (ref 0.1–1.0)
Monocytes Relative: 8 %
Neutro Abs: 2.2 10*3/uL (ref 1.7–7.7)
Neutrophils Relative %: 52 %

## 2017-09-24 LAB — CBC
HCT: 39.3 % (ref 36.0–46.0)
Hemoglobin: 12.8 g/dL (ref 12.0–15.0)
MCH: 28.4 pg (ref 26.0–34.0)
MCHC: 32.6 g/dL (ref 30.0–36.0)
MCV: 87.1 fL (ref 78.0–100.0)
Platelets: 165 10*3/uL (ref 150–400)
RBC: 4.51 MIL/uL (ref 3.87–5.11)
RDW: 14.2 % (ref 11.5–15.5)
WBC: 4.2 10*3/uL (ref 4.0–10.5)

## 2017-09-24 LAB — LIPID PANEL
Cholesterol: 141 mg/dL (ref 0–200)
HDL: 60 mg/dL (ref >40)
LDL Cholesterol: 63 mg/dL (ref 0–99)
Total CHOL/HDL Ratio: 2.4 RATIO
Triglycerides: 92 mg/dL (ref <150)
VLDL: 18 mg/dL (ref 0–40)

## 2017-09-24 LAB — HEMOGLOBIN A1C
Hgb A1c MFr Bld: 6.1 % — ABNORMAL HIGH (ref 4.8–5.6)
Mean Plasma Glucose: 128.37 mg/dL

## 2017-09-24 LAB — APTT: aPTT: 43 seconds — ABNORMAL HIGH (ref 24–36)

## 2017-09-24 LAB — RAPID URINE DRUG SCREEN, HOSP PERFORMED
Amphetamines: NOT DETECTED
Barbiturates: NOT DETECTED
Benzodiazepines: NOT DETECTED
Cocaine: NOT DETECTED
Opiates: NOT DETECTED
Tetrahydrocannabinol: NOT DETECTED

## 2017-09-24 LAB — PROTIME-INR
INR: 3.05
Prothrombin Time: 31.3 seconds — ABNORMAL HIGH (ref 11.4–15.2)

## 2017-09-24 MED ORDER — IOPAMIDOL (ISOVUE-370) INJECTION 76%
INTRAVENOUS | Status: AC
  Filled 2017-09-24: qty 100

## 2017-09-24 MED ORDER — IOPAMIDOL (ISOVUE-370) INJECTION 76%: 100.0000 mL | Freq: Once | INTRAVENOUS | Status: DC | PRN

## 2017-09-24 MED ORDER — CALCIUM CARBONATE 1250 (500 CA) MG PO TABS
1.0000 | ORAL_TABLET | Freq: Two times a day (BID) | ORAL | Status: DC
  Administered 2017-09-24 (×2): 500 mg via ORAL
  Filled 2017-09-24 (×2): qty 1

## 2017-09-24 MED ORDER — BENZONATATE 100 MG PO CAPS: 100.0000 mg | ORAL_CAPSULE | ORAL | Status: DC | PRN

## 2017-09-24 MED ORDER — ACETAMINOPHEN 325 MG PO TABS: 650.0000 mg | ORAL_TABLET | ORAL | Status: DC | PRN

## 2017-09-24 MED ORDER — DOCUSATE SODIUM 100 MG PO CAPS: 100.0000 mg | ORAL_CAPSULE | Freq: Every day | ORAL | Status: DC

## 2017-09-24 MED ORDER — ACETAMINOPHEN 650 MG RE SUPP: 650.0000 mg | RECTAL | Status: DC | PRN

## 2017-09-24 MED ORDER — WARFARIN SODIUM 7.5 MG PO TABS
7.5000 mg | ORAL_TABLET | Freq: Once | ORAL | Status: AC
  Administered 2017-09-24: 7.5 mg via ORAL
  Filled 2017-09-24: qty 1

## 2017-09-24 MED ORDER — ATORVASTATIN CALCIUM 20 MG PO TABS
20.0000 mg | ORAL_TABLET | Freq: Every day | ORAL | Status: DC
  Administered 2017-09-24: 20 mg via ORAL
  Filled 2017-09-24: qty 2
  Filled 2017-09-24: qty 1

## 2017-09-24 MED ORDER — ASPIRIN EC 81 MG PO TBEC
81.0000 mg | DELAYED_RELEASE_TABLET | Freq: Every day | ORAL | Status: DC
  Administered 2017-09-24: 81 mg via ORAL
  Filled 2017-09-24: qty 1

## 2017-09-24 MED ORDER — ACETAMINOPHEN 160 MG/5ML PO SOLN: 650.0000 mg | ORAL | Status: DC | PRN

## 2017-09-24 MED ORDER — VITAMIN D 1000 UNITS PO TABS
2000.0000 [IU] | ORAL_TABLET | Freq: Every day | ORAL | Status: DC
  Administered 2017-09-24: 2000 [IU] via ORAL
  Filled 2017-09-24: qty 2

## 2017-09-24 MED ORDER — PANTOPRAZOLE SODIUM 40 MG PO TBEC
40.0000 mg | DELAYED_RELEASE_TABLET | Freq: Every day | ORAL | Status: DC
  Administered 2017-09-24: 40 mg via ORAL
  Filled 2017-09-24: qty 1

## 2017-09-24 MED ORDER — WARFARIN - PHARMACIST DOSING INPATIENT: Freq: Every day | Status: DC

## 2017-09-24 MED ORDER — STROKE: EARLY STAGES OF RECOVERY BOOK
Freq: Once | Status: AC
  Administered 2017-09-24: 10:00:00
  Filled 2017-09-24: qty 1

## 2017-09-24 NOTE — ED Triage Notes (Signed)
Pt reports that she feels like she might be having another stroke. She has a hx of 3 ischemic strokes and 1 hemorrhagic stroke. She is complaining of dizziness and loss of coordination. Last known well at 11p before bed. She states that her L hand feels different than normal, but denies any unilateral weakness or numbness. No slurring of words, vision changes, facial droop or confusion. She states that her INR is 3-3.5. A&Ox4.

## 2017-09-24 NOTE — Discharge Summary (Addendum)
Discharge Summary  [name] JJH:[id number] DOB: Jan 31, 1946  PCP: Haywood Pao, MD  Admit date: 09/24/2017 Discharge date: 09/24/2017  Time spent: 25 minutes  Recommendations for Outpatient Follow-up:  1. Follow up with Dr Jannifer Franklin on Monday 09/26/17 2. Follow up with your PCP within a week 3. Do maneuver for BPPV as needed to avoid dizziness 4. Take your medications as prescribed 5. Recommend  Outpatient PT follow up for vestibular assessment at a neurorehab center as recommended by our physical therapist 6.   Please follow up with your neurologist Dr. Jannifer Franklin for a referral on Monday 09/26/17.  Discharge Diagnoses:  Active Hospital Problems   Diagnosis Date Noted  . Dizziness 09/24/2017  . Benign paroxysmal positional vertigo   . History of stroke   . Hyperlipemia 08/23/2017  . HTN (hypertension) 04/13/2016  . H/O mitral valve replacement with mechanical valve 10/16/2015  . Diabetes mellitus without complication [facility])     Resolved Hospital Problems  No resolved problems to display.    Discharge Condition: Stable  Diet recommendation: Resume previous diet  Vitals:   09/24/17 1000 09/24/17 1434  BP: 121/79 114/71  Pulse: 66 68  Resp: 17 17  Temp: 97.7 F (36.5 C) 98.3 F (36.8 C)  SpO2: 98% 95%    History of present illness:  [name] is a 71 y.o. female with medical history significant of HTN, HLD, mechanical MV in the late 90s, multiple previous ischemic strokes in past, right hemisphere hemorrhagic infarct or intraparenchymal hemorrhage in August 1997 with residual left-sided weakness.  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 She presents to the ED 11/17 around 4 am with c/o dizziness and unsteady gait. This onset about 11pm last evening.  Persisted through night.  Similar event has been diagnosed as stroke in past.  A  telemedicine neurology consult was placed, and patient was evaluated by telemedicine neurology.  No TPA was offered because of her being on Coumadin and therapeutic INR. She denies any chest pain shortness of breath palpitations nausea vomiting.  1. Negative CTA of the head and neck. No large or proximal arterial branch occlusion. No high-grade or correctable stenosis.  2. Mild left carotid bifurcation atherosclerosis without significant stenosis.  3. An incidental finding of potential clinical significance has been found. 7 mm ground-glass/sub solid left upper lobe pulmonary nodule. Initial follow-up with CT at 6-12 months is recommended to confirm persistence. If persistent, repeat CT is recommended every 2 years until 5 years of stability has been established.   MRI Brain Wo Contrast 09/24/2017 IMPRESSION:  1. Remote encephalomalacia of the posterior right frontal and parietal lobe.  2. No acute infarct.  3. Remote infarcts involving the basal ganglia and cerebellum bilaterally.   Ct Head Code Stroke Wo Contrast 09/24/2017 IMPRESSION:  1. No acute intracranial infarct or other process identified.  2. ASPECTS is 10  3. 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     Hospital Course:  Principal Problem:   Dizziness Active Problems:   Diabetes mellitus without complication (HCC)   H/O mitral valve replacement with mechanical valve   HTN (hypertension)   Hyperlipemia   Benign paroxysmal positional vertigo   History of stroke   BPPV vs possible TIA:   CT head - Chronic right frontoparietal encephalomalacia with overlying craniotomy defect,  MRI  head - Remote infarcts involving the basal ganglia and cerebellum bilaterally. No acute infarcts. 2D Echo - Deffer to outpatient neurologist Dr. Jannifer Franklin, to order echo. LDL - 63- continue statin HgbA1c - 6.1-stable  BPPV -Pt  is being taught Epley maneuver by PT in the hospital  -continue Epley maneuvers at home as needed to avoid dizziness -Fall precaution -Recommend  Outpatient PT follow up for vestibular assessment at a neurorehab center as recommended by our physical therapist -Please follow up with your neurologist Dr. Jannifer Franklin for a referral on Monday 09/26/17.   S/P Mechanical mitral valve replacement -Therapeutic on warfarin INR 3.0 -cardiac healthy diet -continue coumadin -Follow up with warfarin clinic to monitor INR  Hypertension -Stable;  -continue home meds  Hyperlipidemia -stable -continue Lipitor 20 mg daily  -LDL 63, goal < 70    Procedures:  None  Consultations:  Neurology  Stroke continuity provider  Discharge Exam: BP 114/71 (BP Location: Right Arm)   Pulse 68   Temp 98.3 F (36.8 C) (Oral)   Resp 17   Ht 5\' 7"  (1.702 m)   Wt 57.7 kg (127 lb 3.3 oz)   SpO2 95%   BMI 19.92 kg/m   General: 71 year old caucasian female well developed well nourished in NAD Cardiovascular: RRR with grade 2/6 murmur, no rubs or gallops Respiratory: CTA with no wheezes or rales Neuro exam: A&O x3. Motor: 5/5 right upper and right lower extremity.  4+/5 left upper extremity with increased tone.  4+/5 left lower extremity with mildly increased tone.  Discharge Instructions You were cared for by a hospitalist during your hospital stay. If you have any questions about your discharge medications or the care you received while you were in the hospital after you are discharged, you can call the unit and asked to speak with the hospitalist on call if the hospitalist that took care of you is not available. Once you are discharged, your primary care physician will handle any further medical issues. Please note that NO REFILLS for any discharge medications will be authorized once you are discharged, as it is imperative that you return to your primary care physician (or establish a relationship with a  primary care physician if you do not have one) for your aftercare needs so that they can reassess your need for medications and monitor your lab values.  Discharge Instructions    Ambulatory referral to Neurology   Complete by:  As directed    Pt will follow up with Dr. Jannifer [facility] at [facility] in about 2 months. Thanks.     Allergies as of 09/24/2017      Reactions   Zoloft [sertraline] Hives, Itching, Rash      Medication List    TAKE these medications   amLODipine 10 MG tablet Commonly known as:  NORVASC Take 10 mg daily by mouth.   aspirin EC 81 MG tablet Take 81 mg by mouth daily.   atorvastatin 20 MG tablet Commonly known as:  LIPITOR Take 20 mg by mouth daily.   benzonatate 100 MG capsule Commonly known as:  TESSALON Take 100 mg by mouth as needed for cough.   calcium carbonate 1250 MG capsule Take 1,200 mg by mouth 2 (two) times daily with a meal.   cholecalciferol 1000 units tablet Commonly known as:  VITAMIN D Take 2,000 Units daily by mouth.   denosumab 60 MG/ML Soln injection Commonly known as:  PROLIA Inject 60 mg into the skin every 6 (six) months. Administer in upper  arm, thigh, or abdomen   docusate sodium 100 MG capsule Commonly known as:  COLACE Take 100 mg at bedtime by mouth.   omeprazole 20 MG capsule Commonly known as:  PRILOSEC Take 20 mg by mouth daily.   warfarin 5 MG tablet Commonly known as:  COUMADIN Take as directed. If you are unsure how to take this medication, talk to your nurse or doctor. Original instructions:  Take 5-7.5 mg every evening by mouth. 7.5 mg on TUES, THURS, & SAT 5 mg on all other days      Allergies  Allergen Reactions  . Zoloft [Sertraline] Hives, Itching and Rash   Follow-up Information    Kathrynn Ducking, MD. Schedule an appointment as soon as possible for a visit in 6 week(s).   Specialty:  Neurology Contact information: [address] [place] [address] [phone]        Tisovec,  [name], MD Follow up.   Specialty:  Internal Medicine Contact information: [place] [address] [phone]            The results of significant diagnostics from this hospitalization (including imaging, microbiology, ancillary and laboratory) are listed below for reference.    Significant Diagnostic Studies: Ct Angio Head W Or Wo Contrast  Result Date: 09/24/2017 CLINICAL DATA:  Initial evaluation for acute dizziness. EXAM: CT ANGIOGRAPHY HEAD AND NECK TECHNIQUE: Multidetector CT imaging of the head and neck was performed using the standard protocol during bolus administration of intravenous contrast. Multiplanar CT image reconstructions and MIPs were obtained to evaluate the vascular anatomy. Carotid stenosis measurements (when applicable) are obtained utilizing NASCET criteria, using the distal internal carotid diameter as the denominator. CONTRAST:  100 cc of Isovue 370. COMPARISON:  Prior CT from earlier same day. FINDINGS: CTA NECK FINDINGS Aortic arch: Visualized aortic arch of normal caliber with normal branch pattern. No flow-limiting stenosis about the origin of the great vessels. Mild scattered atheromatous plaque about the arch itself. Visualized subclavian artery is patent without stenosis. Right carotid system: Right calming and internal carotid arteries patent without stenosis, dissection, or occlusion. No significant atheromatous narrowing about the right carotid bifurcation. Left carotid system: Left common carotid artery patent from its origin to the bifurcation without stenosis. Mild a centric calcified plaque about the left bifurcation without hemodynamically significant stenosis. Left ICA widely patent distally to the skullbase without stenosis, dissection, or occlusion. Vertebral arteries: Both of the vertebral arteries arise from the subclavian arteries. Vertebral artery is widely patent without stenosis, dissection, or occlusion. Skeleton: No acute  osseous abnormality. No worrisome lytic or blastic osseous lesions. Mild-to-moderate degenerative spondylolysis at C5-6 and C6-7. Other neck: No acute soft tissue abnormality within the neck. No adenopathy. Salivary glands within normal limits. Dystrophic calcification noted within knee left lobe of thyroid. Thyroid otherwise unremarkable. Upper chest: Visualized upper chest within normal limits. 7 mm sub solid nodule within the left upper lobe (series 5, image 15). Visualized lungs are otherwise clear. Review of the MIP images confirms the above findings CTA HEAD FINDINGS Anterior circulation: Internal carotid artery is widely patent to the level the termini. A1 segments widely patent. Normal anterior communicating artery. Anterior cerebral arteries widely patent to their distal aspects without stenosis. M1 segments widely patent without stenosis. Normal MCA bifurcations. Distal MCA branches well opacified and symmetric. Posterior circulation: Vertebral arteries widely patent to the vertebrobasilar junction. Left vertebral artery dominant. Posterior inferior cerebral arteries patent bilaterally. Basilar artery widely patent. Superior cerebral arteries patent  bilaterally. Right PCA supplied via the basilar. Predominant fetal type origin of the left PCA. PCAs widely patent to their distal aspects. Venous sinuses: Patent. Anatomic variants: None significant. No aneurysm or vascular malformation. Delayed phase: Not performed. Review of the MIP images confirms the above findings IMPRESSION: 1. Negative CTA of the head and neck. No large or proximal arterial branch occlusion. No high-grade or correctable stenosis. 2. Mild left carotid bifurcation atherosclerosis without significant stenosis. 3. **An incidental finding of potential clinical significance has been found. 7 mm ground-glass/sub solid left upper lobe pulmonary nodule. Initial follow-up with CT at 6-12 months is recommended to confirm persistence. If  persistent, repeat CT is recommended every 2 years until 5 years of stability has been established. This recommendation follows the consensus statement: Guidelines for Management of Incidental Pulmonary Nodules Detected on CT Images: From the Fleischner Society 2017; Radiology 2017; 284:228-243.** Electronically Signed   By: Jeannine Boga M.D.   On: 09/24/2017 06:32   Dg Chest 2 View  Result Date: 09/24/2017 CLINICAL DATA:  Acute onset of left-sided weakness. EXAM: CHEST  2 VIEW COMPARISON:  None. FINDINGS: The lungs are well-aerated and clear. There is no evidence of focal opacification, pleural effusion or pneumothorax. The heart is mildly enlarged; the patient is status post median sternotomy. A valve replacement is noted. No acute osseous abnormalities are seen. IMPRESSION: Mild cardiomegaly.  Lungs remain grossly clear. Electronically Signed   By: Garald Balding M.D.   On: 09/24/2017 05:14   Ct Angio Neck W And/or Wo Contrast  Result Date: 09/24/2017 CLINICAL DATA:  Initial evaluation for acute dizziness. EXAM: CT ANGIOGRAPHY HEAD AND NECK TECHNIQUE: Multidetector CT imaging of the head and neck was performed using the standard protocol during bolus administration of intravenous contrast. Multiplanar CT image reconstructions and MIPs were obtained to evaluate the vascular anatomy. Carotid stenosis measurements (when applicable) are obtained utilizing NASCET criteria, using the distal internal carotid diameter as the denominator. CONTRAST:  100 cc of Isovue 370. COMPARISON:  Prior CT from earlier same day. FINDINGS: CTA NECK FINDINGS Aortic arch: Visualized aortic arch of normal caliber with normal branch pattern. No flow-limiting stenosis about the origin of the great vessels. Mild scattered atheromatous plaque about the arch itself. Visualized subclavian artery is patent without stenosis. Right carotid system: Right calming and internal carotid arteries patent without stenosis, dissection, or  occlusion. No significant atheromatous narrowing about the right carotid bifurcation. Left carotid system: Left common carotid artery patent from its origin to the bifurcation without stenosis. Mild a centric calcified plaque about the left bifurcation without hemodynamically significant stenosis. Left ICA widely patent distally to the skullbase without stenosis, dissection, or occlusion. Vertebral arteries: Both of the vertebral arteries arise from the subclavian arteries. Vertebral artery is widely patent without stenosis, dissection, or occlusion. Skeleton: No acute osseous abnormality. No worrisome lytic or blastic osseous lesions. Mild-to-moderate degenerative spondylolysis at C5-6 and C6-7. Other neck: No acute soft tissue abnormality within the neck. No adenopathy. Salivary glands within normal limits. Dystrophic calcification noted within knee left lobe of thyroid. Thyroid otherwise unremarkable. Upper chest: Visualized upper chest within normal limits. 7 mm sub solid nodule within the left upper lobe (series 5, image 15). Visualized lungs are otherwise clear. Review of the MIP images confirms the above findings CTA HEAD FINDINGS Anterior circulation: Internal carotid artery is widely patent to the level the termini. A1 segments widely patent. Normal anterior communicating artery. Anterior cerebral arteries widely patent to their distal aspects without stenosis. M1  segments widely patent without stenosis. Normal MCA bifurcations. Distal MCA branches well opacified and symmetric. Posterior circulation: Vertebral arteries widely patent to the vertebrobasilar junction. Left vertebral artery dominant. Posterior inferior cerebral arteries patent bilaterally. Basilar artery widely patent. Superior cerebral arteries patent bilaterally. Right PCA supplied via the basilar. Predominant fetal type origin of the left PCA. PCAs widely patent to their distal aspects. Venous sinuses: Patent. Anatomic variants: None  significant. No aneurysm or vascular malformation. Delayed phase: Not performed. Review of the MIP images confirms the above findings IMPRESSION: 1. Negative CTA of the head and neck. No large or proximal arterial branch occlusion. No high-grade or correctable stenosis. 2. Mild left carotid bifurcation atherosclerosis without significant stenosis. 3. **An incidental finding of potential clinical significance has been found. 7 mm ground-glass/sub solid left upper lobe pulmonary nodule. Initial follow-up with CT at 6-12 months is recommended to confirm persistence. If persistent, repeat CT is recommended every 2 years until 5 years of stability has been established. This recommendation follows the consensus statement: Guidelines for Management of Incidental Pulmonary Nodules Detected on CT Images: From the Fleischner Society 2017; Radiology 2017; 284:228-243.** Electronically Signed   By: Jeannine Boga M.D.   On: 09/24/2017 06:32   Mr Brain Wo Contrast  Result Date: 09/24/2017 CLINICAL DATA:  New onset of dizziness and unsteady gait beginning at 11 p.m. last night. Stroke. EXAM: MRI HEAD WITHOUT CONTRAST TECHNIQUE: Multiplanar, multiecho pulse sequences of the brain and surrounding structures were obtained without intravenous contrast. COMPARISON:  CTA head and neck from the same day. FINDINGS: Brain: Posterior right frontal and parietal encephalomalacia is present. Right parietal craniotomy is noted. There are some blood products associated with the surgical site. The patient's history reflects stroke without evidence for previous tumor. Scratched There is marked thinning of the posterior corpus callosum. Remote lacunar infarcts are present in the cerebellum bilaterally. Remote lacunar infarcts are present in the left thalamus. The diffusion-weighted images demonstrate no evidence for acute or subacute infarct. The ventricles are proportionate to the degree of atrophy with ex vacuo dilation on the right.  The internal auditory canals are within normal limits bilaterally. The brainstem is otherwise normal. Vascular: Flow is present in the major intracranial arteries. Skull and upper cervical spine: The skullbase is within normal limits. The craniocervical junction is normal. Sinuses/Orbits: The paranasal sinuses and mastoid air cells are clear. Globes and orbits are within normal limits. IMPRESSION: 1. Remote encephalomalacia of the posterior right frontal and parietal lobe. 2. No acute infarct. 3. Remote infarcts involving the basal ganglia and cerebellum bilaterally. Electronically Signed   By: San Morelle M.D.   On: 09/24/2017 09:22   Ct Head Code Stroke Wo Contrast  Result Date: 09/24/2017 CLINICAL DATA:  Code stroke. Initial evaluation for acute dizziness, loss of coordination. EXAM: CT HEAD WITHOUT CONTRAST TECHNIQUE: Contiguous axial images were obtained from the base of the skull through the vertex without intravenous contrast. COMPARISON:  Prior CT from 03/28/2017. FINDINGS: Brain: Age-related cerebral atrophy with chronic microvascular ischemic disease. Encephalomalacia within the right frontal parietal region stable from previous. Central calcific density unchanged. Associated ex vacuo dilatation of the right lateral ventricle. Additional small remote bilateral cerebellar infarcts noted. Probable small remote left thalamic lacunar infarct. No acute intracranial hemorrhage. No acute large vessel territory infarct. No mass lesion, midline shift, or mass effect. No hydrocephalus. No extra-axial fluid collection. Vascular: No hyperdense vessel. Scattered vascular calcifications noted within the carotid siphons. Skull: Scalp soft tissues demonstrate no acute abnormality. Prior  right parietal craniotomy noted. Sinuses/Orbits: Globes and orbital soft tissues within normal limits. Paranasal sinuses and mastoid air cells are clear. Other: None ASPECTS ([place] Stroke Program Early CT Score) -  Ganglionic level infarction (caudate, lentiform nuclei, internal capsule, insula, M1-M3 cortex): 7 - Supraganglionic infarction (M4-M6 cortex): 3 Total score (0-10 with 10 being normal): 10 IMPRESSION: 1. No acute intracranial infarct or other process identified. 2. ASPECTS is 10 3. 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 Critical Value/emergent results were called by telephone at the time of interpretation on 09/24/2017 at 3:10 am to Dr. Deno Etienne , who verbally acknowledged these results. Electronically Signed   By: Jeannine Boga M.D.   On: 09/24/2017 03:11    Microbiology: No results found for this or any previous visit (from the past 240 hour(s)).   Labs: Basic Metabolic Panel: Recent Labs  Lab 09/24/17 0240  NA 141  K 3.6  CL 106  CO2 28  GLUCOSE 133*  BUN 28*  CREATININE 0.75  CALCIUM 9.8   Liver Function Tests: Recent Labs  Lab 09/24/17 0240  AST 24  ALT 23  ALKPHOS 83  BILITOT 0.5  PROT 7.5  ALBUMIN 4.4   No results for input(s): LIPASE, AMYLASE in the last 168 hours. No results for input(s): AMMONIA in the last 168 hours. CBC: Recent Labs  Lab 09/24/17 0240  WBC 4.2  NEUTROABS 2.2  HGB 12.8  HCT 39.3  MCV 87.1  PLT 165   Cardiac Enzymes: No results for input(s): CKTOTAL, CKMB, CKMBINDEX, TROPONINI in the last 168 hours. BNP: BNP (last 3 results) No results for input(s): BNP in the last 8760 hours.  ProBNP (last 3 results) No results for input(s): PROBNP in the last 8760 hours.  CBG: No results for input(s): GLUCAP in the last 168 hours.     Signed:  Kayleen Memos, MD [facility] 09/24/2017, 5:02 PM

## 2017-09-24 NOTE — Progress Notes (Signed)
Late entry for 09/24/17 0645 Patient arrived to floor via stretcher/[facility] from [facility] ED. Alert and oriented patient was able to ambulate with assistance to her bed. She reported feeling dizzy, however. Cardiac Monitor#14 was connected, Central Telemetry called and verified. Nameband, Fall Risk, and Allergy Bands were placed on patient. She has an allergy to Zoloft.  Upon assessment patient is has Left Side weakness, a residual from a previous stroke. NIHSS was 2. Oriented to call bell and room, patient is supine. Bed alarm on. Husband at bedside.Safety maintained.

## 2017-09-24 NOTE — Discharge Summary (Addendum)
1. Recommend  Outpatient PT follow up for vestibular assessment at a neurorehab center as recommended by physical therapist 2. Please follow up with your neurologist Dr. Jannifer Franklin for a referral on Monday 09/26/17.

## 2017-09-24 NOTE — ED Notes (Signed)
Patient transported to CT 

## 2017-09-24 NOTE — Discharge Instructions (Signed)

## 2017-09-24 NOTE — Evaluation (Signed)
Occupational Therapy Evaluation Patient Details Name: [name] MRN: [id number] DOB: April 09, 1946 Today's Date: 09/24/2017    History of Present Illness 70 y.o. female who has a past medical history of 3 ischemic strokes in the past and one hemorrhagic stroke with residual left-sided weakness greater than right-sided weakness, hypertension, hyperlipidemia, mechanical mitral valve currently on Coumadin with therapeutic INR, presented to the ER with complaints of dizziness and unsteady gait since 11 PM on 09/23/2017   Clinical Impression   Pt reports she required assist for bathing from her husband PTA. Currently pt overall min assist for ADL and functional mobility. No c/o of dizziness this session. Pt planning to d/c home with supervision from her husband. Pt would benefit from continued skilled OT to address established goals.    Follow Up Recommendations  No OT follow up;Supervision/Assistance - 24 hour    Equipment Recommendations  None recommended by OT    Recommendations for Other Services       Precautions / Restrictions Precautions Precautions: Fall Restrictions Weight Bearing Restrictions: No      Mobility Bed Mobility Overal bed mobility: Needs Assistance Bed Mobility: Sit to Supine       Sit to supine: Supervision;HOB elevated   General bed mobility comments: for safety  Transfers Overall transfer level: Needs assistance Equipment used: 1 person hand held assist Transfers: Sit to/from Stand Sit to Stand: Min assist         General transfer comment: HHA to boost up and for balance in standing    Balance Overall balance assessment: Needs assistance Sitting-balance support: Feet supported;No upper extremity supported Sitting balance-Leahy Scale: Good     Standing balance support: No upper extremity supported;During functional activity Standing balance-Leahy Scale: Fair Standing balance comment: for static standing                            ADL either performed or assessed with clinical judgement   ADL Overall ADL's : Needs assistance/impaired Eating/Feeding: Set up;Sitting   Grooming: Min guard;Standing;Wash/dry hands   Upper Body Bathing: Minimal assistance;Sitting   Lower Body Bathing: Minimal assistance;Sit to/from stand   Upper Body Dressing : Min guard;Sitting   Lower Body Dressing: Minimal assistance;Sit to/from stand   Toilet Transfer: Minimal assistance;Ambulation;Regular Toilet;Grab bars(HHA)   Toileting- Clothing Manipulation and Hygiene: Supervision/safety;Sitting/lateral lean Toileting - Clothing Manipulation Details (indicate cue type and reason): for peri care     Functional mobility during ADLs: Minimal assistance(HHA)       Vision Baseline Vision/History: Wears glasses Wears Glasses: At all times Patient Visual Report: No change from baseline Vision Assessment?: No apparent visual deficits     Perception     Praxis      Pertinent Vitals/Pain Pain Assessment: Faces Faces Pain Scale: Hurts little more Pain Location: L thigh Pain Descriptors / Indicators: Tightness Pain Intervention(s): Monitored during session;Repositioned     Hand Dominance Right   Extremity/Trunk Assessment Upper Extremity Assessment Upper Extremity Assessment: LUE deficits/detail LUE Deficits / Details: residual weakness from prior CVA LUE Coordination: decreased fine motor;decreased gross motor   Lower Extremity Assessment Lower Extremity Assessment: Defer to PT evaluation       Communication Communication Communication: No difficulties   Cognition Arousal/Alertness: Awake/alert Behavior During Therapy: WFL for tasks assessed/performed Overall Cognitive Status: Within Functional Limits for tasks assessed  General Comments       Exercises     Shoulder Instructions      Home Living Family/patient expects to be discharged to:: Private  residence Living Arrangements: Spouse/significant other Available Help at Discharge: Family Type of Home: House Home Access: Stairs to enter Technical brewer of Steps: 1 Entrance Stairs-Rails: None Home Layout: Able to live on main level with bedroom/bathroom     Bathroom Shower/Tub: Tub/shower unit(does not use, only sponge bathes)   Bathroom Toilet: Standard     Home Equipment: Environmental consultant - 2 wheels;Cane - single point          Prior Functioning/Environment Level of Independence: Needs assistance  Gait / Transfers Assistance Needed: furniture walks in the home at times, uses cane in the community ADL's / Homemaking Assistance Needed: husband assist with bathing   Comments: ocassional use of cane        OT Problem List: Decreased strength;Decreased range of motion;Impaired balance (sitting and/or standing);Impaired UE functional use      OT Treatment/Interventions: Self-care/ADL training;Therapeutic exercise;Energy conservation;DME and/or AE instruction;Therapeutic activities;Patient/family education;Balance training    OT Goals(Current goals can be found in the care plan section) Acute Rehab OT Goals Patient Stated Goal: to go home OT Goal Formulation: With patient Time For Goal Achievement: 10/08/17 Potential to Achieve Goals: Good ADL Goals Pt Will Perform Upper Body Dressing: with modified independence;sitting Pt Will Perform Lower Body Dressing: with modified independence;sit to/from stand Pt Will Transfer to Toilet: with supervision;ambulating;regular height toilet Pt Will Perform Toileting - Clothing Manipulation and hygiene: with modified independence;sit to/from stand  OT Frequency: Min 2X/week   Barriers to D/C:            Co-evaluation              AM-PAC PT "6 Clicks" Daily Activity     Outcome Measure Help from another person eating meals?: A Little Help from another person taking care of personal grooming?: A Little Help from another  person toileting, which includes using toliet, bedpan, or urinal?: A Little Help from another person bathing (including washing, rinsing, drying)?: A Little Help from another person to put on and taking off regular upper body clothing?: A Little Help from another person to put on and taking off regular lower body clothing?: A Little 6 Click Score: 18   End of Session    Activity Tolerance: Patient tolerated treatment well Patient left: in bed;with call bell/phone within reach  OT Visit Diagnosis: Unsteadiness on feet (R26.81);Other abnormalities of gait and mobility (R26.89)                Time: 7096-2836 OT Time Calculation (min): 13 min Charges:  OT General Charges $OT Visit: 1 Visit OT Evaluation $OT Eval Moderate Complexity: 1 Mod G-Codes: OT G-codes **NOT FOR INPATIENT CLASS** Functional Assessment Tool Used: Clinical judgement Functional Limitation: Self care Self Care Current Status (O2947): At least 20 percent but less than 40 percent impaired, limited or restricted Self Care Goal Status (M5465): At least 1 percent but less than 20 percent impaired, limited or restricted   Mel Almond A. Ulice Brilliant, M.S., OTR/L Pager: [place] 09/24/2017, 3:51 PM

## 2017-09-24 NOTE — Progress Notes (Signed)
09/24/2017   Vestibular Assessment - 09/24/17 1840      Vestibular Assessment   General Observation  spontaneous occular flutter,  eye alignment is normal, h/o stroke MRI negative this visit for stroke, no tinnitus, fullness, no hearing loss, wears trifocals, has had her vision checked this year, no recent URI/sinus drainage, no recent antibiotic use.         Symptom Behavior   Type of Dizziness  Spinning    Frequency of Dizziness  when it first started 30 mins    Duration of Dizziness  now with sitting up from supine in the bed and short duration    Aggravating Factors  Supine to sit;Sit to stand    Relieving Factors  Lying supine      Occulomotor Exam   Occulomotor Alignment  Normal    Spontaneous  Comment    Gaze-induced  Comment has some oscillations (spontaneous)    Smooth Pursuits  Saccades    Saccades  Dysmetria      Vestibulo-Occular Reflex   VOR 1 Head Only (x 1 viewing)  more symptomatic to do vertical, but horizontal is also symptomatic    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      Auditory   Comments  grossly intact and equal bil      Other Tests   Tragal  negative      Positional Testing   Dix-Hallpike  Dix-Hallpike Right;Dix-Hallpike Left    Horizontal Canal Testing  Horizontal Canal Right;Horizontal Canal Left      Dix-Hallpike Right   Dix-Hallpike Right Duration  0    Dix-Hallpike Right Symptoms  No nystagmus      Dix-Hallpike Left   Dix-Hallpike Left Duration  0    Dix-Hallpike Left Symptoms  No nystagmus      Horizontal Canal Right   Horizontal Canal Right Duration  0      Horizontal Canal Left   Horizontal Canal Left Duration  0      Positional Sensitivities   Supine to Sitting  Mild dizziness    Up from Right Hallpike  Mild dizziness    Up from Left Hallpike  Mild dizziness    Rolling Right  No dizziness    Rolling Left  Mild dizziness     09/24/2017 Pt has some interesting oscillating movement (not necessarily produced with pursuits) along with right  beating nystagmus with right gaze.  She reports a sense of spinning, however, none of her positional testing was positive.  She does still feel more off balance than her baseline and has f/u planned with Dr. Jannifer Franklin (her neurologist) which is good as I am not fully convinced this is not central.  I would also recommend f/u with OP neurorehab center for further balance and vestibular assessment.  Pt given x 1 exercises for gaze stability as this was difficult for the patient.  Pt is scheduled to d/c today.   PT to follow acutely until d/c confirmed.   Barbarann Ehlers Relda Agosto, PT, DPT [phone]  Charges:3 TA, 1 TE

## 2017-09-24 NOTE — Consult Note (Signed)
Neurology Consultation  Reason for Consult: dizziness, stroke/TIA Referring Physician: Dr Tyrone Nine (EDP)  CC: Dizziness  History is obtained from: Patient, chart, husband  HPI: [name] is a 71 y.o. female who has a past medical history of 3 ischemic strokes in the past and one hemorrhagic stroke with residual left-sided weakness greater than right-sided weakness, hypertension, hyperlipidemia, mechanical mitral valve currently on Coumadin with therapeutic INR, presented to the ER with complaints of dizziness and unsteady gait since 11 PM on 09/23/2017. A telemedicine neurology consult was placed, and patient was evaluated by telemedicine neurology.  No TPA was offered because of her being on Coumadin and therapeutic INR. Patient reports that she went to bed around 11 and felt that the room was spinning.  This was concerning to her because in the past when she had strokes she had similar kind of symptoms.  She also felt that she has to grab onto things to maintain her balance while walking, which is also new, and similar to what had happened during 1 of her prior strokes. Currently she feels nearly back to baseline but still feels a little bit unsteady. She has not been having any fevers chills weight loss or flulike illness recently. She denies any chest pain shortness of breath palpitations nausea vomiting.   LKW: 11 PM on 09/23/2017 tpa given?: no, was initially evaluated by telemedicine neurology who deemed that she had low NIH and also was on Coumadin with therapeutic INR. Premorbid modified Rankin scale (mRS): 2  ROS: A 14 point ROS was performed and is negative except as noted in the HPI.    Past Medical History:  Diagnosis Date  . Abnormality of gait 09/07/2016  . Allergy   . Diabetes mellitus without complication [facility])    Patient denies this - notes history of glucose intolerance  . 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 History  of pneumonia 1997  . S/P MVR (mitral valve replacement)    Mechanical mitral valve replacement at age 89 (done in [place])  // echo 7/17: EF 55-60, normal wall motion, bileaflet mechanical mitral valve prosthesis functioning normally, mild LAE, mildly reduced RVSF, small pericardial effusion  . Stroke [facility] 1997, 2013, 2015   Family History  Problem Relation Age of Onset  . Cancer Mother        Bone  . Heart disease Mother   . Hyperlipidemia Mother   . Hypertension Mother   . Stroke Father   . Hypertension Father   . Heart attack Neg Hx    Social History:   reports that  has never smoked. she has never used smokeless tobacco. She reports that she does not drink alcohol or use drugs.  Medications  Current Facility-Administered Medications:  .   stroke: mapping our early stages of recovery book, , Does not apply, Once, Alcario Drought, Jared M, DO .  acetaminophen (TYLENOL) tablet 650 mg, 650 mg, Oral, Q4H PRN **OR** acetaminophen (TYLENOL) solution 650 mg, 650 mg, Per Tube, Q4H PRN **OR** acetaminophen (TYLENOL) suppository 650 mg, 650 mg, Rectal, Q4H PRN, Alcario Drought, Jared M, DO .  aspirin EC tablet 81 mg, 81 mg, Oral, Daily, Alcario Drought, Jared M, DO .  atorvastatin (LIPITOR) tablet 20 mg, 20 mg, Oral, Daily, Alcario Drought, Jared M, DO .  benzonatate (TESSALON) capsule 100 mg, 100 mg, Oral, PRN, Etta Quill, DO .  calcium carbonate capsule 1,250 mg, 1,250 mg, Oral, BID WC, Alcario Drought, Jared M, DO .  cholecalciferol (VITAMIN D)  tablet 2,000 Units, 2,000 Units, Oral, Daily, Alcario Drought, Jared M, DO .  docusate sodium (COLACE) capsule 100 mg, 100 mg, Oral, QHS, Gardner, Jared M, DO .  pantoprazole (PROTONIX) EC tablet 40 mg, 40 mg, Oral, Daily, Alcario Drought, Jared M, DO  Current Outpatient Medications:  .  amLODipine (NORVASC) 10 MG tablet, Take 10 mg daily by mouth., Disp: , Rfl:  .  aspirin EC 81 MG tablet, Take 81 mg by mouth daily. , Disp: , Rfl:  .  atorvastatin (LIPITOR) 20 MG tablet, Take 20 mg by mouth  daily., Disp: , Rfl:  .  benzonatate (TESSALON) 100 MG capsule, Take 100 mg by mouth as needed for cough., Disp: , Rfl:  .  calcium carbonate 1250 MG capsule, Take 1,200 mg by mouth 2 (two) times daily with a meal., Disp: , Rfl:  .  cholecalciferol (VITAMIN D) 1000 UNITS tablet, Take 2,000 Units daily by mouth. , Disp: , Rfl:  .  denosumab (PROLIA) 60 MG/ML SOLN injection, Inject 60 mg into the skin every 6 (six) months. Administer in upper arm, thigh, or abdomen, Disp: , Rfl:  .  docusate sodium (COLACE) 100 MG capsule, Take 100 mg at bedtime by mouth. , Disp: , Rfl:  .  omeprazole (PRILOSEC) 20 MG capsule, Take 20 mg by mouth daily., Disp: , Rfl:  .  warfarin (COUMADIN) 5 MG tablet, Take 5-7.5 mg every evening by mouth. 7.5 mg on TUES, THURS, & SAT 5 mg on all other days, Disp: , Rfl:   Exam: Current vital signs: BP 127/70   Pulse 80   Temp 97.8 F (36.6 C)   Resp 19   SpO2 96%  Vital signs in last 24 hours: Temp:  [97.4 F (36.3 C)-97.8 F (36.6 C)] 97.8 F (36.6 C) (11/17 0316) Pulse Rate:  [80-98] 80 (11/17 0401) Resp:  [14-20] 19 (11/17 0401) BP: (121-151)/(70-91) 127/70 (11/17 0401) SpO2:  [96 %-100 %] 96 % (11/17 0401)  GENERAL: Awake, alert in NAD HEENT: - Normocephalic and atraumatic, dry mm, no LN++, no Thyromegally LUNGS - Clear to auscultation bilaterally with no wheezes CV - S1S2 RRR, no m/r/g, equal pulses bilaterally. ABDOMEN - Soft, nontender, nondistended with normoactive BS Ext: warm, well perfused, intact peripheral pulses, no edema  NEURO:  Mental Status: AA&Ox3  Language: speech is mildly dysarthric.  Naming, repetition, fluency, and comprehension intact. Cranial Nerves: PERRL . EOMI no nystagmus, visual fields full, no facial asymmetry,facial sensation intact, hearing intact, tongue/uvula/soft palate midline, normal sternocleidomastoid and trapezius muscle strength. No evidence of tongue atrophy or fibrillations Motor: 5/5 right upper and right lower  extremity.  4+/5 left upper extremity with increased tone.  4+/5 left lower extremity with mildly increased tone and slight vertical drift. No drift in the other 3 extremities. Sensation-decreased on the left hemibody compared to the right, some of this is her baseline but she feels that the sensory loss is worse today than usual days Coordination: No gross dysmetria on finger to nose, on the left slightly dysmetric not disproportionate to the weakness.. Gait- deferred  NIHSS 1a Level of Conscious.: 0 1b LOC Questions: 0 1c LOC Commands: 0 2 Best Gaze: 0 3 Visual: 0 4 Facial Palsy: 0 5a Motor Arm - left: 0 5b Motor Arm - Right: 0 6a Motor Leg - Left: 1 6b Motor Leg - Right: 0 7 Limb Ataxia: 0 8 Sensory: 1 9 Best Language: 0 10 Dysarthria: 1 11 Extinct. and Inatten.:0  TOTAL: 3  Labs I have reviewed  labs in epic and the results pertinent to this consultation are: INR>3, mildly elevated BUN  CBC    Component Value Date/Time   WBC 4.2 09/24/2017 0240   RBC 4.51 09/24/2017 0240   HGB 12.8 09/24/2017 0240   HCT 39.3 09/24/2017 0240   PLT 165 09/24/2017 0240   MCV 87.1 09/24/2017 0240   MCH 28.4 09/24/2017 0240   MCHC 32.6 09/24/2017 0240   RDW 14.2 09/24/2017 0240   LYMPHSABS 1.6 09/24/2017 0240   MONOABS 0.3 09/24/2017 0240   EOSABS 0.1 09/24/2017 0240   BASOSABS 0.0 09/24/2017 0240    CMP     Component Value Date/Time   NA 141 09/24/2017 0240   K 3.6 09/24/2017 0240   CL 106 09/24/2017 0240   CO2 28 09/24/2017 0240   GLUCOSE 133 (H) 09/24/2017 0240   BUN 28 (H) 09/24/2017 0240   CREATININE 0.75 09/24/2017 0240   CALCIUM 9.8 09/24/2017 0240   PROT 7.5 09/24/2017 0240   ALBUMIN 4.4 09/24/2017 0240   AST 24 09/24/2017 0240   ALT 23 09/24/2017 0240   ALKPHOS 83 09/24/2017 0240   BILITOT 0.5 09/24/2017 0240   GFRNONAA >60 09/24/2017 0240   GFRAA >60 09/24/2017 0240  UA not suggestive of UTI. Imaging I have reviewed the images obtained: CT-scan of the brain -   IMPRESSION: 1. No acute intracranial infarct or other process identified. 2. ASPECTS is 10 3. 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  Assessment:  71 year old woman with a past medical history of 3 ischemic strokes in the past and one hemorrhagic stroke with residual left-sided weakness greater than right-sided weakness, hypertension, hyperlipidemia, mechanic mitral valve on Coumadin presented to the ER at [facility] with complaints of dizziness and unsteady gait since 11 PM on 09/23/2017 telemedicine neurology and not deemed to be a candidate for IV TPA because of Coumadin on board and mild symptoms. On my examination, she has mild dysarthria, mild left lower extremity drift and decreased sensation on left hemibody compared to right. Most of her symptoms are baseline. This current presentation could reflect either a new stroke given her history of strokes, the first 1 was at 71 years of age versus recrudescence of old symptoms in the setting of a systemic illness versus peripheral causes of vertigo.  Impression: Evaluate for acute ischemic stroke/TIA involving the posterior circulation Possible recrudescence of stroke symptoms Evaluate for toxic metabolic derangements/infections  Recommendations: -Admit to hospitalist @ [facility] -Telemetry monitoring -Allow for permissive hypertension for the first 24-48h - only treat PRN if SBP >180 mmHg (she is on Coumadin and ASA). Blood pressures can be gradually normalized to SBP<140 upon discharge. -MRI brain without contrast -CT Angiogram of Head and neck (should be possibly done at [facility] prior to transfer to [facility]) -Echocardiogram -HgbA1c, fasting lipid panel -Frequent neuro checks -Prophylactic therapy-continue with anticoagulation and antiplatelet unless MRI shows a large stroke. -Atorvastatin 80 mg PO daily -Risk factor  modification -PT consult, OT consult, Speech consult -Urinalysis has been checked and is negative for UTI.  Please also check chest x-ray for any evidence of pneumonia. -Mildly elevated BUN.  Would recommend IV fluids.  Please page stroke NP/PA/MD (listed on AMION)  from 8am-4 pm as this patient will be followed by the stroke team at this point.  --- Amie Portland, MD [facility] [phone] If 7pm to 7am, please call on call as listed on AMION.

## 2017-09-24 NOTE — Progress Notes (Addendum)
STROKE TEAM PROGRESS NOTE   SUBJECTIVE (INTERVAL HISTORY) Her son and husband are at the bedside.  She recounted HPI with me. She had two episodes of vertigo associated with lying down and sitting up. Typical for BPPV. Symptoms since resolved. Marye Round test by PT was negative. MRI negative for stroke   OBJECTIVE Temp:  [97.4 F (36.3 C)-98.3 F (36.8 C)] 98.3 F (36.8 C) (11/17 1434) Pulse Rate:  [66-98] 68 (11/17 1434) Cardiac Rhythm: Normal sinus rhythm;Heart block (11/17 0756) Resp:  [13-20] 17 (11/17 1434) BP: (114-151)/(65-91) 114/71 (11/17 1434) SpO2:  [95 %-100 %] 95 % (11/17 1434) Weight:  [127 lb 3.3 oz (57.7 kg)] 127 lb 3.3 oz (57.7 kg) (11/17 0630)  CBC:  Recent Labs  Lab 09/24/17 0240  WBC 4.2  NEUTROABS 2.2  HGB 12.8  HCT 39.3  MCV 87.1  PLT 268    Basic Metabolic Panel:  Recent Labs  Lab 09/24/17 0240  NA 141  K 3.6  CL 106  CO2 28  GLUCOSE 133*  BUN 28*  CREATININE 0.75  CALCIUM 9.8    Lipid Panel:     Component Value Date/Time   CHOL 141 09/24/2017 0500   TRIG 92 09/24/2017 0500   HDL 60 09/24/2017 0500   CHOLHDL 2.4 09/24/2017 0500   VLDL 18 09/24/2017 0500   LDLCALC 63 09/24/2017 0500   HgbA1c:  Lab Results  Component Value Date   HGBA1C 6.1 (H) 09/24/2017   Urine Drug Screen:     Component Value Date/Time   LABOPIA NONE DETECTED 09/24/2017 0240   COCAINSCRNUR NONE DETECTED 09/24/2017 0240   LABBENZ NONE DETECTED 09/24/2017 0240   AMPHETMU NONE DETECTED 09/24/2017 0240   THCU NONE DETECTED 09/24/2017 0240   LABBARB NONE DETECTED 09/24/2017 0240    Alcohol Level No results found for: [place] I have personally reviewed the radiological images below and agree with the radiology interpretations.  Ct Angio Head W Or Wo Contrast Ct Angio Neck W And/or Wo Contrast 09/24/2017 IMPRESSION:  1. Negative CTA of the head and neck. No large or proximal arterial branch occlusion. No high-grade or correctable stenosis.  2. Mild  left carotid bifurcation atherosclerosis without significant stenosis.  3. An incidental finding of potential clinical significance has been found. 7 mm ground-glass/sub solid left upper lobe pulmonary nodule. Initial follow-up with CT at 6-12 months is recommended to confirm persistence. If persistent, repeat CT is recommended every 2 years until 5 years of stability has been established.   Mr Brain Wo Contrast 09/24/2017 IMPRESSION:  1. Remote encephalomalacia of the posterior right frontal and parietal lobe.  2. No acute infarct.  3. Remote infarcts involving the basal ganglia and cerebellum bilaterally.   Ct Head Code Stroke Wo Contrast 09/24/2017 IMPRESSION:  1. No acute intracranial infarct or other process identified.  2. ASPECTS is 10  3. 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    PHYSICAL EXAM Vitals:   09/24/17 0530 09/24/17 0630 09/24/17 1000 09/24/17 1434  BP: 126/70 122/66 121/79 114/71  Pulse: 67 66 66 68  Resp: 13 20 17 17   Temp:  98 F (36.7 C) 97.7 F (36.5 C) 98.3 F (36.8 C)  TempSrc:  Oral Oral Oral  SpO2: 96% 96% 98% 95%  Weight:  127 lb 3.3 oz (57.7 kg)    Height:  5\' 7"  (1.702 m)      Temp:  [97.4 F (36.3 C)-98.3 F (  36.8 C)] 98.3 F (36.8 C) (11/17 1434) Pulse Rate:  [66-98] 68 (11/17 1434) Resp:  [13-20] 17 (11/17 1434) BP: (114-151)/(65-91) 114/71 (11/17 1434) SpO2:  [95 %-100 %] 95 % (11/17 1434) Weight:  [127 lb 3.3 oz (57.7 kg)] 127 lb 3.3 oz (57.7 kg) (11/17 0630)  General - Well nourished, well developed, in no apparent distress.  Ophthalmologic - Sharp disc margins OU.   Cardiovascular - Regular rate and rhythm.  Mental Status -  Level of arousal and orientation to time, place, and person were intact. Language including expression, naming, repetition, comprehension was assessed and found intact. Fund of Knowledge was  assessed and was intact.  Cranial Nerves II - XII - II - Visual field intact OU. III, IV, VI - Extraocular movements intact. V - Facial sensation intact bilaterally. VII - Facial movement intact bilaterally. VIII - Hearing & vestibular intact bilaterally. X - Palate elevates symmetrically. XI - Chin turning & shoulder shrug intact bilaterally. XII - Tongue protrusion intact.  Motor Strength - The patient's strength was normal in all extremities except LUE 4/5 proximal and left hand dexterity difficulty and pronator drift was absent.  Bulk was normal and fasciculations were absent.   Motor Tone - Muscle tone was assessed at the neck and appendages and was normal.  Reflexes - The patient's reflexes were 1+ in all extremities and she had no pathological reflexes.  Sensory - Light touch, temperature/pinprick were assessed and were symmetrical except left LE decreased sensation, 80-90% of the right.    Coordination - The patient had normal movements in the hands with no ataxia or dysmetria.  Tremor was absent. Dix-Hallpike negative.  Gait and Station - deferred   ASSESSMENT/PLAN [name] is a 71 y.o. female with history of 3 ischemic strokes in the past and one hemorrhagic stroke with residual left-sided weakness greater than right-sided weakness, hypertension, hyperlipidemia, mechanical mitral valve currently on Coumadin with therapeutic INR, presenting with dizziness and unsteady gait. She did not receive IV t-PA due to coumadin therapy.  BPPV most likely - typical symptoms despite neg Dix-Hallpike  Resultant  resolved  MRI head - no acute infarct   CTA H&N - negative  LDL - 63  HgbA1c - 6.1  VTE prophylaxis - warfarin Diet Heart Room service appropriate? Yes; Fluid consistency: Thin  warfarin daily and ASA 81 mg prior to admission, now on warfarin daily and ASA 81 mg. Continue ASA and coumadin on discharge. INR goal 2.5-3.5 for mechanical valve.  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   Residue left UE weakness, at baseline  Follow up with Dr. Jannifer Franklin at [facility]  S/p MVR mechanical valve  INR on admission 3.05, therapeutic  Continue coumadin, INR 2.5-3.5  Hypertension  Stable  Long-term BP goal normotensive  Hyperlipidemia  Home meds:  Lipitor 20 mg daily resumed in hospital  LDL 63, goal < 70  Continue statin at discharge  Other Stroke Risk Factors  Advanced age  Family hx stroke (Father)  Other Active Problems  7 mm ground-glass/sub solid left upper lobe pulmonary nodule. F/u recommended.  Hospital day # 0  Neurology will sign off. Please call with  questions. Pt will follow up with Dr. Jannifer Franklin at [facility] in about 6 weeks. Thanks for the consult.  Rosalin Hawking, MD PhD Stroke Neurology 09/24/2017 4:50 PM   To contact Stroke Continuity provider, please refer to [email or web address] After hours, contact General Neurology

## 2017-09-24 NOTE — Progress Notes (Signed)
Discharge Instructions    Ambulatory referral to Neurology   Complete by:  As directed    Pt will follow up with Dr. Jannifer Franklin at [facility] in about 2 months. Thanks.      Patient and spouse given discharge packet and were allowed to ask questions. All questions answered to patient's pleasure. IV removed, patient taken down to discharge lobby via wheelchair.

## 2017-09-24 NOTE — ED Provider Notes (Signed)
[facility] DEPT Provider Note   CSN: [id number] Arrival date & time: 09/24/17  0225     History   Chief Complaint Chief Complaint  Patient presents with  . Dizziness  . Loss of Coordination  . Weakness    L hand    HPI Captola Teschner is a 71 y.o. female.  71 yo F with a chief complaint of unsteadiness.  The patient felt this about 11 last night.  She then laid down and went to sleep.  When she woke up to use the bathroom she was unable to ambulate on her own.  She felt that the world was unsteady.  She has had a similar event that was diagnosed as a stroke in the past.  She has had 3 strokes.  Has a mechanical heart valve and is on Coumadin.  She denies recent head injury.  Denies headache or neck pain.   The history is provided by the patient.  Dizziness  Quality:  Imbalance Severity:  Severe Onset quality:  Sudden Duration:  4 hours Timing:  Constant Progression:  Worsening Chronicity:  New Context: head movement   Relieved by:  Being still Worsened by:  Nothing Ineffective treatments:  None tried Associated symptoms: weakness   Associated symptoms: no chest pain, no headaches, no nausea, no palpitations, no shortness of breath and no vomiting   Weakness  Primary symptoms include dizziness. Pertinent negatives include no shortness of breath, no chest pain, no vomiting and no headaches.    Past Medical History:  Diagnosis Date  . Abnormality of gait 09/07/2016  . Allergy   . Diabetes mellitus without complication [facility])    Patient denies this - notes history of glucose intolerance  . Hemiparesis and alteration of sensations as late effects of stroke ([place]) 09/07/2016  . History of pneumonia 1997  . S/P MVR (mitral valve replacement)    Mechanical mitral valve replacement at age 34 (done in [place])  // echo 7/17: EF 55-60, normal wall motion, bileaflet mechanical mitral valve prosthesis functioning normally, mild LAE, mildly reduced  RVSF, small pericardial effusion  . Stroke [facility]) 1997, 2013, 2015    Patient Active Problem List   Diagnosis Date Noted  . Dizziness 09/24/2017  . Hyperlipemia 08/23/2017  . Encounter for therapeutic drug monitoring 08/08/2017  . Long term (current) use of anticoagulants [Z79.01] 07/12/2017  . Hemiparesis and alteration of sensations as late effects of stroke ([place]) 09/07/2016  . Abnormality of gait 09/07/2016  . HTN (hypertension) 04/13/2016  . H/O mitral valve replacement with mechanical valve 10/16/2015  . Diabetes mellitus without complication ([place])   . Hemiparesis (L sided - mild) due to old stroke [facility])     Past Surgical History:  Procedure Laterality Date  . ABDOMINAL HYSTERECTOMY    . BRAIN SURGERY    . BUNIONECTOMY  1993  . CARDIAC VALVE REPLACEMENT  1997  . TOE SURGERY  1996    OB History    No data available       Home Medications    Prior to Admission medications   Medication Sig Start Date End Date Taking? Authorizing Provider  amLODipine (NORVASC) 10 MG tablet Take 10 mg daily by mouth.   Yes [provider]  aspirin EC 81 MG tablet Take 81 mg by mouth daily.    Yes [provider]  atorvastatin (LIPITOR) 20 MG tablet Take 20 mg by mouth daily.   Yes [provider]  benzonatate (TESSALON) 100 MG capsule Take  100 mg by mouth as needed for cough.   Yes [provider]  calcium carbonate 1250 MG capsule Take 1,200 mg by mouth 2 (two) times daily with a meal.   Yes [provider]  cholecalciferol (VITAMIN D) 1000 UNITS tablet Take 2,000 Units daily by mouth.    Yes [provider]  denosumab (PROLIA) 60 MG/ML SOLN injection Inject 60 mg into the skin every 6 (six) months. Administer in upper arm, thigh, or abdomen   Yes [provider]  docusate sodium (COLACE) 100 MG capsule Take 100 mg at bedtime by mouth.    Yes [provider]  omeprazole (PRILOSEC) 20 MG capsule Take 20 mg by mouth  daily.   Yes [provider]  warfarin (COUMADIN) 5 MG tablet Take 5-7.5 mg every evening by mouth. 7.5 mg on TUES, THURS, & SAT 5 mg on all other days   Yes [provider]    Family History Family History  Problem Relation Age of Onset  . Cancer Mother        Bone  . Heart disease Mother   . Hyperlipidemia Mother   . Hypertension Mother   . Stroke Father   . Hypertension Father   . Heart attack Neg Hx     Social History Social History   Tobacco Use  . Smoking status: Never Smoker  . Smokeless tobacco: Never Used  Substance Use Topics  . Alcohol use: No    Alcohol/week: 0.0 oz  . Drug use: No     Allergies   Zoloft [sertraline]   Review of Systems Review of Systems  Constitutional: Negative for chills and fever.  HENT: Negative for congestion and rhinorrhea.   Eyes: Negative for redness and visual disturbance.  Respiratory: Negative for shortness of breath and wheezing.   Cardiovascular: Negative for chest pain and palpitations.  Gastrointestinal: Negative for nausea and vomiting.  Genitourinary: Negative for dysuria and urgency.  Musculoskeletal: Negative for arthralgias and myalgias.  Skin: Negative for pallor and wound.  Neurological: Positive for dizziness and weakness. Negative for headaches.     Physical Exam Updated Vital Signs BP 127/70   Pulse 80   Temp 97.8 F (36.6 C)   Resp 19   SpO2 96%   Physical Exam  Constitutional: She is oriented to person, place, and time. She appears well-developed and well-nourished. No distress.  HENT:  Head: Normocephalic and atraumatic.  Eyes: EOM are normal. Pupils are equal, round, and reactive to light.  Neck: Normal range of motion. Neck supple.  Cardiovascular: Normal rate and regular rhythm. Exam reveals no gallop and no friction rub.  No murmur heard. Pulmonary/Chest: Effort normal. She has no wheezes. She has no rales.  Abdominal: Soft. She exhibits no distension and no mass. There  is no tenderness. There is no guarding.  Musculoskeletal: She exhibits no edema or tenderness.  Neurological: She is alert and oriented to person, place, and time. No cranial nerve deficit or sensory deficit. GCS eye subscore is 4. GCS verbal subscore is 5. GCS motor subscore is 6. She displays no Babinski's sign on the right side. She displays no Babinski's sign on the left side.  Left lower extremity 4 out of 5 compared to right.  Wavering finger to nose with the left upper extremity.  These 2 findings are unchanged from the patient's baseline per the patient.  Skin: Skin is warm and dry. She is not diaphoretic.  Psychiatric: She has a normal mood and affect. Her  behavior is normal.  Nursing note and vitals reviewed.    ED Treatments / Results  Labs (all labs ordered are listed, but only abnormal results are displayed) Labs Reviewed  PROTIME-INR - Abnormal; Notable for the following components:      Result Value   Prothrombin Time 31.3 (*)    All other components within normal limits  APTT - Abnormal; Notable for the following components:   aPTT 43 (*)    All other components within normal limits  COMPREHENSIVE METABOLIC PANEL - Abnormal; Notable for the following components:   Glucose, Bld 133 (*)    BUN 28 (*)    All other components within normal limits  URINALYSIS, ROUTINE W REFLEX MICROSCOPIC - Abnormal; Notable for the following components:   Color, Urine COLORLESS (*)    Hgb urine dipstick SMALL (*)    All other components within normal limits  CBC  DIFFERENTIAL  ETHANOL  RAPID URINE DRUG SCREEN, HOSP PERFORMED  HEMOGLOBIN A1C  LIPID PANEL  I-STAT CHEM 8, ED  I-STAT TROPONIN, ED    EKG  EKG Interpretation  Date/Time:  Saturday September 24 2017 02:32:36 EST Ventricular Rate:  87 PR Interval:    QRS Duration: 123 QT Interval:  387 QTC Calculation: 466 R Axis:   153 Text Interpretation:  Sinus rhythm Atrial premature complex Short PR interval IVCD, consider  atypical RBBB Baseline wander in lead(s) V6 No old tracing to compare Confirmed by Deno Etienne [phone]) on 09/24/2017 2:39:32 AM       Radiology Ct Head Code Stroke Wo Contrast  Result Date: 09/24/2017 CLINICAL DATA:  Code stroke. Initial evaluation for acute dizziness, loss of coordination. EXAM: CT HEAD WITHOUT CONTRAST TECHNIQUE: Contiguous axial images were obtained from the base of the skull through the vertex without intravenous contrast. COMPARISON:  Prior CT from 03/28/2017. FINDINGS: Brain: Age-related cerebral atrophy with chronic microvascular ischemic disease. Encephalomalacia within the right frontal parietal region stable from previous. Central calcific density unchanged. Associated ex vacuo dilatation of the right lateral ventricle. Additional small remote bilateral cerebellar infarcts noted. Probable small remote left thalamic lacunar infarct. No acute intracranial hemorrhage. No acute large vessel territory infarct. No mass lesion, midline shift, or mass effect. No hydrocephalus. No extra-axial fluid collection. Vascular: No hyperdense vessel. Scattered vascular calcifications noted within the carotid siphons. Skull: Scalp soft tissues demonstrate no acute abnormality. Prior right parietal craniotomy noted. Sinuses/Orbits: Globes and orbital soft tissues within normal limits. Paranasal sinuses and mastoid air cells are clear. Other: None ASPECTS ([place] Stroke Program Early CT Score) - Ganglionic level infarction (caudate, lentiform nuclei, internal capsule, insula, M1-M3 cortex): 7 - Supraganglionic infarction (M4-M6 cortex): 3 Total score (0-10 with 10 being normal): 10 IMPRESSION: 1. No acute intracranial infarct or other process identified. 2. ASPECTS is 10 3. 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 Critical Value/emergent results were called by telephone at the time of  interpretation on 09/24/2017 at 3:10 am to Dr. Deno Etienne , who verbally acknowledged these results. Electronically Signed   By: Jeannine Boga M.D.   On: 09/24/2017 03:11    Procedures Procedures (including critical care time)  Medications Ordered in ED Medications  aspirin EC tablet 81 mg (not administered)  atorvastatin (LIPITOR) tablet 20 mg (not administered)  pantoprazole (PROTONIX) EC tablet 40 mg (not administered)  docusate sodium (COLACE) capsule 100 mg (not administered)  cholecalciferol (VITAMIN D) tablet 2,000 Units (not administered)  calcium carbonate capsule  1,250 mg (not administered)  benzonatate (TESSALON) capsule 100 mg (not administered)   stroke: mapping our early stages of recovery book (not administered)  acetaminophen (TYLENOL) tablet 650 mg (not administered)    Or  acetaminophen (TYLENOL) solution 650 mg (not administered)    Or  acetaminophen (TYLENOL) suppository 650 mg (not administered)     Initial Impression / Assessment and Plan / ED Course  I have reviewed the triage vital signs and the nursing notes.  Pertinent labs & imaging results that were available during my care of the patient were reviewed by me and considered in my medical decision making (see chart for details).     71 yo F with a chief complaint of unsteadiness.  There is no new neuro deficit found on my exam.  With the patient being possibly in the window will have teleneurology evaluate.  They feel she is not a TPA candidate.  I discussed case with Dr. Malen Gauze, neurologist over at [facility].  He came and evaluated the patient.  Recommended a CT angiogram of the head and neck.  Admit.   The patients results and plan were reviewed and discussed.   Any x-rays performed were independently reviewed by myself.   Differential diagnosis were considered with the presenting HPI.  Medications  aspirin EC tablet 81 mg (not administered)  atorvastatin (LIPITOR) tablet 20 mg (not administered)    pantoprazole (PROTONIX) EC tablet 40 mg (not administered)  docusate sodium (COLACE) capsule 100 mg (not administered)  cholecalciferol (VITAMIN D) tablet 2,000 Units (not administered)  calcium carbonate capsule 1,250 mg (not administered)  benzonatate (TESSALON) capsule 100 mg (not administered)   stroke: mapping our early stages of recovery book (not administered)  acetaminophen (TYLENOL) tablet 650 mg (not administered)    Or  acetaminophen (TYLENOL) solution 650 mg (not administered)    Or  acetaminophen (TYLENOL) suppository 650 mg (not administered)    Vitals:   09/24/17 0315 09/24/17 0316 09/24/17 0330 09/24/17 0401  BP: 121/86  (!) 137/91 127/70  Pulse: 82  98 80  Resp: 15  14 19   Temp:  97.8 F (36.6 C)    TempSrc:      SpO2: 96%  98% 96%    Final diagnoses:  Stroke [facility])  Stroke [facility])    Admission/ observation were discussed with the admitting physician, patient and/or family and they are comfortable with the plan.   Final Clinical Impressions(s) / ED Diagnoses   Final diagnoses:  Stroke [facility])  Stroke [facility])    ED Discharge Orders    None       Deno Etienne, DO 09/24/17 0501

## 2017-09-24 NOTE — Consult Note (Signed)
[facility] TeleNeurology Consult Services  Asked to see this patient in telemedicine consultation. Consultation was performed with assistance of ancillary/medical staff at bedside.  Comments: Last Known normal 2300 Door Time: 225 [facility] Contacted: 242 [facility] first log in: 249 NIHSS assessment time: 255 Needle Time: no iv tpa Call back time: 300  HPI:  [age] previous ischemic strokes, mitral valve replacement, on a/c, presents with dizziness. She states with each of her previous strokes she had dizziness as well. She felt fine when she went to bed and later this evening had dizziness. She has baseline weakness on her left side along with numbness and denies any new sxs.  CT scan head per my review appears unchanged in comparison to previous study in 03/2017.   VSS  Gen Wn/Wd in Nad  TeleStroke Assessment: LOC:   0 LOC questions:  0 LOC Commands :   0 Gaze : 0 Visual fields :  0  Facial movements : 0 Upper limb Motor  0 Lower limb Motor  1 Limb Coordination  - 0 Sensory -  - 1 Language -  0 Speech -   0 Neglect / extinction -  0  NIHSS Score: 2 (baseline sxs)    IMPRESSION  Reactivation of old stroke sxs RO Acute Stroke Hx of previous R hemispheric stroke Hx of mitral valve replacement on a/c.  Medical Decision Making:   Patient is not candidate for alteplase due to non focal / localizing exam and use of a/c.  Not an IR candidate as low clinical suspicion for LVO by neurologic assessment.   Recommendations: 1- continue a/c if no contraindication.  2- Further work up with Stroke labs, MRI brain, will be deferred to inpt neurology service 3-  Needs Inpatient Neurology consultation and follow up  4- Thank you for allowing [place] to participate in the care of your patient, if there are any questions please don't hesitate to contact us  Discussed plan of care with patient/hospital staff   Physician: Sylvan Cheese, DO   [facility]

## 2017-09-24 NOTE — H&P (Addendum)
History and Physical    [name] OIN:[id number] DOB: 23-Sep-1946 DOA: 09/24/2017  PCP: Haywood Pao, MD  Patient coming from: Home  I have personally briefly reviewed patient's old medical records in [place]  Chief Complaint: Dizziness  HPI: [name] is a 71 y.o. 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 She had a right hemisphere hemorrhagic infarct or intraparenchymal hemorrhage in August 1997 with residual left-sided weakness.  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  She presents to the ED this evening with c/o dizziness and unsteady gait.  This onset about 11pm last evening.  Persisted through night.  Similar event has been diagnosed as stroke in past.  ED Course: Symptoms worse with movement, better at rest.  Improved somewhat but still present.  CT head neg.  INR 3.0.  Obviously patient getting [facility] and stroke work up given her extensive history and risk factors for stroke.   Review of Systems: As per HPI otherwise 10 point review of systems negative.   Past Medical History:  Diagnosis Date  . Abnormality of gait 09/07/2016  . Allergy   . Diabetes mellitus without complication [facility])    Patient denies this - notes history of glucose intolerance  . Hemiparesis and alteration of sensations as late effects of stroke ([place]) 09/07/2016  . History of pneumonia 1997  . S/P MVR (mitral valve replacement)    Mechanical mitral valve replacement at age 54 (done in [place])  // echo 7/17: EF 55-60, normal wall motion, bileaflet mechanical mitral valve prosthesis  functioning normally, mild LAE, mildly reduced RVSF, small pericardial effusion  . Stroke [facility]) 1997, 2013, 2015    Past Surgical History:  Procedure Laterality Date  . ABDOMINAL HYSTERECTOMY    . BRAIN SURGERY    . BUNIONECTOMY  1993  . CARDIAC VALVE REPLACEMENT  1997  . TOE SURGERY  1996     reports that  has never smoked. she has never used smokeless tobacco. She reports that she does not drink alcohol or use drugs.  Allergies  Allergen Reactions  . Zoloft [Sertraline] Hives, Itching and Rash    Family History  Problem Relation Age of Onset  . Cancer Mother        Bone  . Heart disease Mother   . Hyperlipidemia Mother   . Hypertension Mother   . Stroke Father   . Hypertension Father   . Heart attack Neg Hx      Prior to Admission medications   Medication Sig Start Date End Date Taking? Authorizing Provider  amLODipine (NORVASC) 10 MG tablet Take 10 mg daily by mouth.   Yes [provider]  aspirin EC 81 MG tablet Take 81 mg by mouth daily.    Yes [provider]  atorvastatin (LIPITOR) 20 MG tablet Take 20 mg by mouth daily.   Yes [provider]  benzonatate (TESSALON) 100 MG capsule Take 100 mg by mouth as needed for cough.   Yes [provider]  calcium carbonate 1250 MG capsule Take 1,200 mg by mouth 2 (two) times daily with a meal.  Yes [provider]  cholecalciferol (VITAMIN D) 1000 UNITS tablet Take 2,000 Units daily by mouth.    Yes [provider]  denosumab (PROLIA) 60 MG/ML SOLN injection Inject 60 mg into the skin every 6 (six) months. Administer in upper arm, thigh, or abdomen   Yes [provider]  docusate sodium (COLACE) 100 MG capsule Take 100 mg at bedtime by mouth.    Yes [provider]  omeprazole (PRILOSEC) 20 MG capsule Take 20 mg by mouth daily.   Yes [provider]  warfarin (COUMADIN) 5 MG tablet Take 5 mg by mouth as directed.     [provider]     Physical Exam: Vitals:   09/24/17 0245 09/24/17 0315 09/24/17 0316 09/24/17 0330  BP:  121/86  (!) 137/91  Pulse: 86 82  98  Resp: 18 15  14   Temp:   97.8 F (36.6 C)   TempSrc:      SpO2: 96% 96%  98%    Constitutional: NAD, calm, comfortable Eyes: PERRL, lids and conjunctivae normal ENMT: Mucous membranes are moist. Posterior pharynx clear of any exudate or lesions.Normal dentition.  Neck: normal, supple, no masses, no thyromegaly Respiratory: clear to auscultation bilaterally, no wheezing, no crackles. Normal respiratory effort. No accessory muscle use.  Cardiovascular: Regular rate and rhythm, no murmurs / rubs / gallops. No extremity edema. 2+ pedal pulses. No carotid bruits.  Abdomen: no tenderness, no masses palpated. No hepatosplenomegaly. Bowel sounds positive.  Musculoskeletal: no clubbing / cyanosis. No joint deformity upper and lower extremities. Good ROM, no contractures. Normal muscle tone.  Skin: no rashes, lesions, ulcers. No induration Neurologic: CN 2-12 grossly intact. Sensation intact, DTR normal. Strength 5/5 in all 4.  Psychiatric: Normal judgment and insight. Alert and oriented x 3. Normal mood.    Labs on Admission: I have personally reviewed following labs and imaging studies  CBC: Recent Labs  Lab 09/24/17 0240  WBC 4.2  NEUTROABS 2.2  HGB 12.8  HCT 39.3  MCV 87.1  PLT 578   Basic Metabolic Panel: Recent Labs  Lab 09/24/17 0240  NA 141  K 3.6  CL 106  CO2 28  GLUCOSE 133*  BUN 28*  CREATININE 0.75  CALCIUM 9.8   GFR: CrCl cannot be calculated (Unknown ideal weight.). Liver Function Tests: Recent Labs  Lab 09/24/17 0240  AST 24  ALT 23  ALKPHOS 83  BILITOT 0.5  PROT 7.5  ALBUMIN 4.4   No results for input(s): LIPASE, AMYLASE in the last 168 hours. No results for input(s): AMMONIA in the last 168 hours. Coagulation Profile: Recent Labs  Lab 09/19/17 09/24/17 0240  INR 2.7 3.05   Cardiac Enzymes: No results for  input(s): CKTOTAL, CKMB, CKMBINDEX, TROPONINI in the last 168 hours. BNP (last 3 results) No results for input(s): PROBNP in the last 8760 hours. HbA1C: No results for input(s): HGBA1C in the last 72 hours. CBG: No results for input(s): GLUCAP in the last 168 hours. Lipid Profile: No results for input(s): CHOL, HDL, LDLCALC, TRIG, CHOLHDL, LDLDIRECT in the last 72 hours. Thyroid Function Tests: No results for input(s): TSH, T4TOTAL, FREET4, T3FREE, THYROIDAB in the last 72 hours. Anemia Panel: No results for input(s): VITAMINB12, FOLATE, FERRITIN, TIBC, IRON, RETICCTPCT in the last 72 hours. Urine analysis:    Component Value Date/Time   COLORURINE COLORLESS (A) 09/24/2017 0240   APPEARANCEUR CLEAR 09/24/2017 0240   LABSPEC 1.006 09/24/2017 0240   PHURINE 7.0 09/24/2017 0240   GLUCOSEU NEGATIVE  09/24/2017 0240   HGBUR SMALL (A) 09/24/2017 0240   BILIRUBINUR NEGATIVE 09/24/2017 0240   KETONESUR NEGATIVE 09/24/2017 0240   PROTEINUR NEGATIVE 09/24/2017 0240   NITRITE NEGATIVE 09/24/2017 0240   LEUKOCYTESUR NEGATIVE 09/24/2017 0240    Radiological Exams on Admission: Ct Head Code Stroke Wo Contrast  Result Date: 09/24/2017 CLINICAL DATA:  Code stroke. Initial evaluation for acute dizziness, loss of coordination. EXAM: CT HEAD WITHOUT CONTRAST TECHNIQUE: Contiguous axial images were obtained from the base of the skull through the vertex without intravenous contrast. COMPARISON:  Prior CT from 03/28/2017. FINDINGS: Brain: Age-related cerebral atrophy with chronic microvascular ischemic disease. Encephalomalacia within the right frontal parietal region stable from previous. Central calcific density unchanged. Associated ex vacuo dilatation of the right lateral ventricle. Additional small remote bilateral cerebellar infarcts noted. Probable small remote left thalamic lacunar infarct. No acute intracranial hemorrhage. No acute large vessel territory infarct. No mass lesion, midline shift, or  mass effect. No hydrocephalus. No extra-axial fluid collection. Vascular: No hyperdense vessel. Scattered vascular calcifications noted within the carotid siphons. Skull: Scalp soft tissues demonstrate no acute abnormality. Prior right parietal craniotomy noted. Sinuses/Orbits: Globes and orbital soft tissues within normal limits. Paranasal sinuses and mastoid air cells are clear. Other: None ASPECTS ([place] Stroke Program Early CT Score) - Ganglionic level infarction (caudate, lentiform nuclei, internal capsule, insula, M1-M3 cortex): 7 - Supraganglionic infarction (M4-M6 cortex): 3 Total score (0-10 with 10 being normal): 10 IMPRESSION: 1. No acute intracranial infarct or other process identified. 2. ASPECTS is 10 3. 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 Critical Value/emergent results were called by telephone at the time of interpretation on 09/24/2017 at 3:10 am to Dr. Deno Etienne , who verbally acknowledged these results. Electronically Signed   By: Jeannine Boga M.D.   On: 09/24/2017 03:11    EKG: Independently reviewed.  Assessment/Plan Principal Problem:   Dizziness Active Problems:   Diabetes mellitus without complication (HCC)   H/O mitral valve replacement with mechanical valve   HTN (hypertension)   Hyperlipemia    1. Dizziness - Patient at very high stroke risk with mechanical MV.  Had dizziness as presenting symptom during prior strokes.  Has had prior strokes, etc. 1. Stroke pathway and work up 2. CTA head and neck per Dr. Rory Percy 3. MRI brain 4. 2d echo 5. A1C 6. Lipid Profile 7. PT/OT/SLP 2. Mechanical MV 1. Continue coumadin per pharm 2. INR 3.0 today 3. HTN - 1. Holding amlodipine for now pending MRI brain and stroke work up 4. DM - appears to be diet controlled 5. HLD - continue statin  DVT prophylaxis: Coumadin Code Status: Full Family  Communication: Husband at bedside Disposition Plan: Home after admit Consults called: Neuro Admission status: Place in obs - convert to inpatient when / if stroke confirmed   [place], [place] [facility] Pager [phone]  If 7AM-7PM, please contact day team taking care of patient [email or web address] Password [email or web address]  09/24/2017, 4:00 AM

## 2017-09-24 NOTE — Progress Notes (Signed)
ANTICOAGULATION CONSULT NOTE - Initial Consult  Pharmacy Consult for Warfarin Indication: Mitral Valve Replacement with mechanical valve  Allergies  Allergen Reactions  . Zoloft [Sertraline] Hives, Itching and Rash      Vital Signs: Temp: 97.8 F (36.6 C) (11/17 0316) Temp Source: Oral (11/17 0221) BP: 137/91 (11/17 0330) Pulse Rate: 98 (11/17 0330)  Labs: Recent Labs    09/24/17 0240  HGB 12.8  HCT 39.3  PLT 165  APTT 43*  LABPROT 31.3*  INR 3.05  CREATININE 0.75    CrCl cannot be calculated (Unknown ideal weight.).   Medical History: Past Medical History:  Diagnosis Date  . Abnormality of gait 09/07/2016  . Allergy   . Diabetes mellitus without complication [facility])    Patient denies this - notes history of glucose intolerance  . 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 History of pneumonia 1997  . S/P MVR (mitral valve replacement)    Mechanical mitral valve replacement at age 71 (done in [place])  // echo 7/17: EF 55-60, normal wall motion, bileaflet mechanical mitral valve prosthesis functioning normally, mild LAE, mildly reduced RVSF, small pericardial effusion  . Stroke [facility]) 1997, 2013, 2015    Medications:  Scheduled:  .  stroke: mapping our early stages of recovery book   Does not apply Once  . aspirin EC  81 mg Oral Daily  . atorvastatin  20 mg Oral Daily  . calcium carbonate  1,250 mg Oral BID WC  . cholecalciferol  2,000 Units Oral Daily  . docusate sodium  100 mg Oral QHS  . pantoprazole  40 mg Oral Daily   Infusions:    Assessment: 71 yoF with previous ischemic strokes, mitral valve replacement on chronic warfarin.  HD 5mg  daily except 7.5 mg Tues/Thur/Sat Goal of Therapy:  INR 3-3.5    Plan:  Daily PT/INR  Lawana Pai R 09/24/2017,4:00 AM

## 2017-09-24 NOTE — Progress Notes (Signed)
ANTICOAGULATION CONSULT NOTE - Follow Up Consult  Pharmacy Consult for Warfarin Indication: Mitral Valve Replacement with Mechanical Valve  Allergies  Allergen Reactions  . Zoloft [Sertraline] Hives, Itching and Rash    Patient Measurements: Height: 5\' 7"  (170.2 cm) Weight: 127 lb 3.3 oz (57.7 kg) IBW/kg (Calculated) : 61.6  Vital Signs: Temp: 97.7 F (36.5 C) (11/17 1000) Temp Source: Oral (11/17 1000) BP: 121/79 (11/17 1000) Pulse Rate: 66 (11/17 1000)  Labs: Recent Labs    09/24/17 0240  HGB 12.8  HCT 39.3  PLT 165  APTT 43*  LABPROT 31.3*  INR 3.05  CREATININE 0.75    Estimated Creatinine Clearance: 58.8 mL/min (by C-G formula based on SCr of 0.75 mg/dL).  Assessment: 32 yoF with PMH significant for multiple ischemic strokes and one hemorraghic stroke that presented to the ED with dizziness/unsteady gait. Mitral valve replacement on chronic warfarin. No tPA given as she presented on Coumadin with a therapeutic INR of 3.05.    MRI not conclusive for stroke, pharmacy consulted to restart PTA Coumadin.   PTA Coumadin dose: 5 mg daily except 7.5 mg Tues/Thurs/Sat.  Goal of Therapy:  INR goal of 3-3.5 Monitor platelets by anticoagulation protocol: Yes   Plan:  Coumadin 7.5 mg x1 tonight Daily INR/CBC Monitor for s/sx of bleeding   Altair Appenzeller L. Kyung Rudd, PharmD, [place] PGY1 Pharmacy Resident Pager: [phone]

## 2017-09-24 NOTE — Evaluation (Signed)
Physical Therapy Evaluation Patient Details Name: [name] MRN: [id number] DOB: [birth year] Today's Date: 09/24/2017   History of Present Illness  71 y.o. female who has a past medical history of 3 ischemic strokes in the past and one hemorrhagic stroke with residual left-sided weakness greater than right-sided weakness, hypertension, hyperlipidemia, mechanical mitral valve currently on Coumadin with therapeutic INR, presented to the ER with complaints of dizziness and unsteady gait since 11 PM on 09/23/2017  Clinical Impression  Orders received for PT evaluation. Patient demonstrates deficits in functional mobility as indicated below. Will benefit from continued skilled PT to address deficits and maximize function. Will see as indicated and progress as tolerated.  Patient expressed reports of dizziness (spinning sensation) with positional changes. Spinning to the right. May benefit from vestibular evaluation in setting of negative acute MRI.    Follow Up Recommendations No PT follow up    Equipment Recommendations  None recommended by PT    Recommendations for Other Services (vestibular consult)     Precautions / Restrictions Precautions Precautions: Fall      Mobility  Bed Mobility Overal bed mobility: Modified Independent             General bed mobility comments: increased time to perform, no physical assist requried  Transfers Overall transfer level: Needs assistance Equipment used: 1 person hand held assist;None Transfers: Sit to/from Stand Sit to Stand: Min guard         General transfer comment: no physical assist required, min guard for safety  Ambulation/Gait Ambulation/Gait assistance: Min assist;Min guard Ambulation Distance (Feet): 210 Feet Assistive device: 1 person hand held assist;None Gait Pattern/deviations: Step-through pattern;Decreased stride length;Narrow base of support;Decreased stance time - left;Decreased dorsiflexion - left Gait  velocity: decreased   General Gait Details: initial instability with need for physical HHA, improved over the course of distance progressing to no assist. Alterred gait at baseline from previous strokes LLE deficts residual  Stairs            Wheelchair Mobility    Modified Rankin (Stroke Patients Only) Modified Rankin (Stroke Patients Only) Pre-Morbid Rankin Score: Moderate disability Modified Rankin: Moderately severe disability     Balance Overall balance assessment: Needs assistance   Sitting balance-Leahy Scale: Good     Standing balance support: During functional activity Standing balance-Leahy Scale: Fair Standing balance comment: able to stand without support, difficult to accept challenge outside of BOS                             Pertinent Vitals/Pain Pain Assessment: Faces Faces Pain Scale: Hurts even more Pain Location: Left thigh  Pain Descriptors / Indicators: Sore Pain Intervention(s): Monitored during session    Home Living Family/patient expects to be discharged to:: Private residence Living Arrangements: Spouse/significant other Available Help at Discharge: Family Type of Home: House Home Access: Stairs to enter Entrance Stairs-Rails: None Entrance Stairs-Number of Steps: 1 Home Layout: Able to live on main level with bedroom/bathroom Home Equipment: Walker - 2 wheels;Cane - single point      Prior Function Level of Independence: Needs assistance   Gait / Transfers Assistance Needed: furniture walks in the home at times, uses cane in the community  ADL's / Homemaking Assistance Needed: husband assist with bathing  Comments: ocassional use of cane     Hand Dominance   Dominant Hand: Right    Extremity/Trunk Assessment   Upper Extremity Assessment Upper Extremity Assessment: Defer to OT  evaluation    Lower Extremity Assessment Lower Extremity Assessment: LLE deficits/detail LLE Deficits / Details: residual left sided  weakness from previous stroke LLE Coordination: decreased fine motor;decreased gross motor       Communication      Cognition Arousal/Alertness: Awake/alert Behavior During Therapy: WFL for tasks assessed/performed Overall Cognitive Status: Within Functional Limits for tasks assessed                                        General Comments      Exercises     Assessment/Plan    PT Assessment Patient needs continued PT services  PT Problem List Decreased strength;Decreased activity tolerance;Decreased mobility;Decreased balance       PT Treatment Interventions DME instruction;Gait training;Functional mobility training;Therapeutic activities;Therapeutic exercise;Balance training;Patient/family education    PT Goals (Current goals can be found in the Care Plan section)  Acute Rehab PT Goals Patient Stated Goal: to go home PT Goal Formulation: With patient Time For Goal Achievement: 10/08/17 Potential to Achieve Goals: Good    Frequency Min 4X/week   Barriers to discharge        Co-evaluation               AM-PAC PT "6 Clicks" Daily Activity  Outcome Measure Difficulty turning over in bed (including adjusting bedclothes, sheets and blankets)?: A Little Difficulty moving from lying on back to sitting on the side of the bed? : A Little Difficulty sitting down on and standing up from a chair with arms (e.g., wheelchair, bedside commode, etc,.)?: A Little Help needed moving to and from a bed to chair (including a wheelchair)?: A Little Help needed walking in hospital room?: A Little Help needed climbing 3-5 steps with a railing? : A Little 6 Click Score: 18    End of Session Equipment Utilized During Treatment: Gait belt Activity Tolerance: Patient tolerated treatment well Patient left: in bed;with call bell/phone within reach Nurse Communication: Mobility status PT Visit Diagnosis: Unsteadiness on feet (R26.81);Dizziness and giddiness (R42)     Time: 4827-0786 PT Time Calculation (min) (ACUTE ONLY): 20 min   Charges:   PT Evaluation $PT Eval Moderate Complexity: 1 Mod     PT G Codes:   PT G-Codes **NOT FOR INPATIENT CLASS** Functional Assessment Tool Used: Clinical judgement Functional Limitation: Mobility: Walking and moving around Mobility: Walking and Moving Around Current Status (L5449): At least 20 percent but less than 40 percent impaired, limited or restricted Mobility: Walking and Moving Around Goal Status [phone]): At least 1 percent but less than 20 percent impaired, limited or restricted    Alben Deeds, PT DPT  Board Certified Neurologic Specialist [place] 09/24/2017, 1:12 PM

## 2017-09-26 ENCOUNTER — Ambulatory Visit (INDEPENDENT_AMBULATORY_CARE_PROVIDER_SITE_OTHER): Payer: Medicare Other | Admitting: Internal Medicine

## 2017-09-26 DIAGNOSIS — I69359 Hemiplegia and hemiparesis following cerebral infarction affecting unspecified side: Secondary | ICD-10-CM

## 2017-09-26 DIAGNOSIS — Z5181 Encounter for therapeutic drug level monitoring: Secondary | ICD-10-CM

## 2017-09-26 DIAGNOSIS — Z1231 Encounter for screening mammogram for malignant neoplasm of breast: Secondary | ICD-10-CM | POA: Diagnosis not present

## 2017-09-26 DIAGNOSIS — Z7901 Long term (current) use of anticoagulants: Secondary | ICD-10-CM | POA: Diagnosis not present

## 2017-09-26 DIAGNOSIS — Z952 Presence of prosthetic heart valve: Secondary | ICD-10-CM

## 2017-09-26 LAB — POCT INR: INR: 4.7

## 2017-09-27 ENCOUNTER — Ambulatory Visit (INDEPENDENT_AMBULATORY_CARE_PROVIDER_SITE_OTHER): Payer: Medicare Other | Admitting: Neurology

## 2017-09-27 ENCOUNTER — Encounter (INDEPENDENT_AMBULATORY_CARE_PROVIDER_SITE_OTHER): Payer: Self-pay

## 2017-09-27 ENCOUNTER — Encounter: Payer: Self-pay | Admitting: Neurology

## 2017-09-27 VITALS — BP 93/62 | HR 94 | Ht 67.0 in | Wt 131.0 lb

## 2017-09-27 DIAGNOSIS — I639 Cerebral infarction, unspecified: Secondary | ICD-10-CM | POA: Diagnosis not present

## 2017-09-27 DIAGNOSIS — R42 Dizziness and giddiness: Secondary | ICD-10-CM | POA: Diagnosis not present

## 2017-09-27 NOTE — Patient Instructions (Signed)
   Call our office if the vertigo recurs.

## 2017-09-27 NOTE — Progress Notes (Signed)
Reason for visit: Vertigo  [name] is an 71 y.o. 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  The patient is on Coumadin, status post mitral valve replacement.  The patient has had several episodes of brief vertigo off and on over the last 6 months.  On 23 September 2017, the patient went to bed and felt somewhat wobbly, but otherwise was okay.  The patient awakened early in the morning on 24 September 2017 with overt vertigo, she required assistance getting to the bathroom, the episode of vertigo lasted about 8-10 minutes.  The episode then resolved.  The patient reported no visual loss, double vision, slurred speech, or any new numbness or weakness of the face, arms, legs.  The patient decided to go to the emergency room.  The patient does not believe that there was a headache associated with vertigo.  The patient underwent a stroke workup that revealed a negative MRI of the brain, she had CT angiogram of the head and neck that was unremarkable.  The patient was therapeutic on her Coumadin.  The patient has not had any further episodes of vertigo since being out of the hospital.  She comes into this office for an urgent evaluation.  Past Medical History:  Diagnosis Date  . Abnormality of gait 09/07/2016  . Allergy   . Diabetes mellitus without complication [facility])    Patient denies this - notes history of glucose intolerance  . Hemiparesis and alteration of sensations as late effects of stroke ([place]) 09/07/2016  . History of pneumonia 1997  . S/P MVR (mitral valve replacement)    Mechanical mitral valve replacement at age 39 (done in [place])  // echo 7/17: EF 55-60, normal wall motion, bileaflet mechanical mitral valve prosthesis functioning normally, mild LAE, mildly reduced RVSF, small pericardial effusion  . Stroke [facility]) 1997, 2013, 2015    Past Surgical  History:  Procedure Laterality Date  . ABDOMINAL HYSTERECTOMY    . BRAIN SURGERY    . BUNIONECTOMY  1993  . CARDIAC VALVE REPLACEMENT  1997  . TOE SURGERY  1996    Family History  Problem Relation Age of Onset  . Cancer Mother        Bone  . Heart disease Mother   . Hyperlipidemia Mother   . Hypertension Mother   . Stroke Father   . Hypertension Father   . Heart attack Neg Hx     Social history:  reports that  has never smoked. she has never used smokeless tobacco. She reports that she does not drink alcohol or use drugs.    Allergies  Allergen Reactions  . Zoloft [Sertraline] Hives, Itching and Rash    Medications:  Prior to Admission medications   Medication Sig Start Date End Date Taking? Authorizing Provider  amLODipine (NORVASC) 10 MG tablet Take 10 mg daily by mouth.   Yes [provider]  aspirin EC 81 MG tablet Take 81 mg by mouth daily.    Yes [provider]  atorvastatin (LIPITOR) 20 MG tablet Take 20 mg by mouth daily.   Yes [provider]  benzonatate (TESSALON) 100 MG capsule Take 100 mg by mouth as needed for cough.   Yes [provider]  calcium carbonate 1250 MG capsule Take 1,200 mg by mouth 2 (two) times daily with a meal.   Yes [provider]  cholecalciferol (VITAMIN D) 1000 UNITS tablet Take 2,000 Units daily by mouth.    Yes [provider]  denosumab (PROLIA) 60 MG/ML SOLN injection Inject 60 mg into the skin every 6 (six) months. Administer in upper arm, thigh, or abdomen   Yes [provider]  docusate sodium (COLACE) 100 MG capsule Take 100 mg at bedtime by mouth.    Yes [provider]  omeprazole (PRILOSEC) 20 MG capsule Take 20 mg by mouth daily.   Yes [provider]  warfarin (COUMADIN) 5 MG tablet Take 5-7.5 mg every evening by mouth. 7.5 mg on TUES, THURS, & SAT 5 mg on all other days   Yes [provider]    ROS:  Out of a complete 14 system  review of symptoms, the patient complains only of the following symptoms, and all other reviewed systems are negative.  Vertigo Snoring Easy bruising, easy bleeding Restless legs  Blood pressure 93/62, pulse 94, height 5\' 7"  (1.702 m), weight 131 lb (59.4 kg).  Physical Exam  General: The patient is alert and cooperative at the time of the examination.   Neck: Neck is supple, no carotid bruits are noted.  Respiratory: lung fields are clear.  Cardiovascular: Regular rate and rhythm, no obvious murmurs or rubs were noted.  Eyes: Pupils are equal, round, and reactive to light.  Discs are flat bilaterally.  Ears: Tympanic membranes are clear bilaterally.  Skin: No significant peripheral edema is noted.   Neurologic Exam  Mental status: The patient is alert and oriented x 3 at the time of the examination. The patient has apparent normal recent and remote memory, with an apparently normal attention span and concentration ability.   Cranial nerves: Facial symmetry is present. Speech is normal, no aphasia or dysarthria is noted. Extraocular movements are full. Visual fields are full.  Pinprick and soft touch sensation on the face is symmetric.  Motor: The patient has good strength in all 4 extremities, the patient holds the left arm in flexion, there is some increased motor tone in the left leg.  Sensory examination: Soft touch sensation is symmetric on the face and arms, decreased sensation on the left leg as compared to the right.  Pinprick sensation is actually decreased on the right leg as compared to the left, vibration sensation is decreased on the left leg as compared to the right.  Coordination: The patient has ataxia with finger-nose-finger with the left upper extremity, normal on the right.  The patient is able to perform heel-to-shin bilaterally.  Gait and station: The patient has a circumduction gait with the left leg.  The patient will oftentimes use a cane for ambulation.   Tandem gait was not attempted.  Romberg is negative.  Reflexes: Deep tendon reflexes are symmetric on the arms, somewhat brisk on the left leg as compared to the right.   CT head 03/28/17:  IMPRESSION: This is an abnormal CT scan of the head showing: 1. 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 There is an overlying craniotomy defect that is likely related.  2. Multiple smaller strokes and chronic ischemic changes involving the cerebellum, left internal capsule and the deep white matter bilaterally. 3. There are no acute findings.   Carotid doppler 03/29/17:  Summary: Bilateral: intimal wall thickening CCA. Mild mixed plaque origin ICA. 1-39% ICA plaquing. Vertebral artery flow is antegrade.   MRI brain 09/24/17:  IMPRESSION: 1. Remote encephalomalacia of the posterior right frontal and parietal lobe. 2.  No acute infarct. 3. Remote infarcts involving the basal ganglia and cerebellum Bilaterally.  * MRI scan images were reviewed online. I agree with the written report.   CTA head and neck 09/24/17:  IMPRESSION: 1. Negative CTA of the head and neck. No large or proximal arterial branch occlusion. No high-grade or correctable stenosis. 2. Mild left carotid bifurcation atherosclerosis without significant stenosis.  Assessment/Plan:  1.  Cerebrovascular disease  2.  Vertigo, possible positional vertigo  3.  Episodic visual disturbances, migraine headache  The patient remains on Coumadin.  The stroke workup done recently does not show a new event.  The patient will remain on her current medical regimen, if the vertigo returns, the patient will contact our office, we may consider vestibular rehabilitation.  The patient otherwise will follow-up in 6 months.  Jill Alexanders MD 09/27/2017 7:25 AM  [facility] [address] [place], [place] [address]  Phone [phone] Fax [phone]

## 2017-10-03 ENCOUNTER — Ambulatory Visit (INDEPENDENT_AMBULATORY_CARE_PROVIDER_SITE_OTHER): Payer: Medicare Other | Admitting: Internal Medicine

## 2017-10-03 DIAGNOSIS — Z7901 Long term (current) use of anticoagulants: Secondary | ICD-10-CM

## 2017-10-03 DIAGNOSIS — Z952 Presence of prosthetic heart valve: Secondary | ICD-10-CM | POA: Diagnosis not present

## 2017-10-03 DIAGNOSIS — Z5181 Encounter for therapeutic drug level monitoring: Secondary | ICD-10-CM

## 2017-10-03 DIAGNOSIS — I69359 Hemiplegia and hemiparesis following cerebral infarction affecting unspecified side: Secondary | ICD-10-CM

## 2017-10-03 LAB — POCT INR: INR: 3.5

## 2017-10-10 ENCOUNTER — Ambulatory Visit (INDEPENDENT_AMBULATORY_CARE_PROVIDER_SITE_OTHER): Payer: Medicare Other | Admitting: Cardiology

## 2017-10-10 DIAGNOSIS — Z7901 Long term (current) use of anticoagulants: Secondary | ICD-10-CM

## 2017-10-10 DIAGNOSIS — Z5181 Encounter for therapeutic drug level monitoring: Secondary | ICD-10-CM | POA: Diagnosis not present

## 2017-10-10 DIAGNOSIS — I69359 Hemiplegia and hemiparesis following cerebral infarction affecting unspecified side: Secondary | ICD-10-CM | POA: Diagnosis not present

## 2017-10-10 DIAGNOSIS — Z952 Presence of prosthetic heart valve: Secondary | ICD-10-CM | POA: Diagnosis not present

## 2017-10-10 LAB — POCT INR: INR: 3.4

## 2017-10-11 DIAGNOSIS — R921 Mammographic calcification found on diagnostic imaging of breast: Secondary | ICD-10-CM | POA: Diagnosis not present

## 2017-10-13 ENCOUNTER — Other Ambulatory Visit (HOSPITAL_COMMUNITY): Payer: Self-pay | Admitting: *Deleted

## 2017-10-14 ENCOUNTER — Ambulatory Visit (HOSPITAL_COMMUNITY)
Admission: RE | Admit: 2017-10-14 | Discharge: 2017-10-14 | Disposition: A | Discharge status: Home / Self Care | Payer: Medicare Other | Source: Ambulatory Visit | Attending: Internal Medicine | Admitting: Internal Medicine

## 2017-10-14 DIAGNOSIS — M810 Age-related osteoporosis without current pathological fracture: Secondary | ICD-10-CM | POA: Diagnosis not present

## 2017-10-14 MED ORDER — DENOSUMAB 60 MG/ML ~~LOC~~ SOLN
60.0000 mg | Freq: Once | SUBCUTANEOUS | Status: AC
  Administered 2017-10-14: 60 mg via SUBCUTANEOUS
  Filled 2017-10-14 (×2): qty 1

## 2017-10-17 LAB — POCT INR: INR: 3.7

## 2017-10-19 ENCOUNTER — Ambulatory Visit (INDEPENDENT_AMBULATORY_CARE_PROVIDER_SITE_OTHER): Payer: Medicare Other | Admitting: Interventional Cardiology

## 2017-10-19 DIAGNOSIS — Z952 Presence of prosthetic heart valve: Secondary | ICD-10-CM | POA: Diagnosis not present

## 2017-10-19 DIAGNOSIS — Z5181 Encounter for therapeutic drug level monitoring: Secondary | ICD-10-CM

## 2017-10-19 DIAGNOSIS — I69359 Hemiplegia and hemiparesis following cerebral infarction affecting unspecified side: Secondary | ICD-10-CM

## 2017-10-19 DIAGNOSIS — Z7901 Long term (current) use of anticoagulants: Secondary | ICD-10-CM

## 2017-10-24 ENCOUNTER — Ambulatory Visit (INDEPENDENT_AMBULATORY_CARE_PROVIDER_SITE_OTHER): Payer: Medicare Other

## 2017-10-24 DIAGNOSIS — I69359 Hemiplegia and hemiparesis following cerebral infarction affecting unspecified side: Secondary | ICD-10-CM

## 2017-10-24 DIAGNOSIS — Z5181 Encounter for therapeutic drug level monitoring: Secondary | ICD-10-CM

## 2017-10-24 DIAGNOSIS — Z952 Presence of prosthetic heart valve: Secondary | ICD-10-CM | POA: Diagnosis not present

## 2017-10-24 DIAGNOSIS — Z7901 Long term (current) use of anticoagulants: Secondary | ICD-10-CM | POA: Diagnosis not present

## 2017-10-24 LAB — POCT INR: INR: 3.3

## 2017-10-24 NOTE — Patient Instructions (Signed)
Spoke with pt advised to continue taking the same dosage 1 tablet (5mg ) daily except 1.5 tablets (7.5mg ) on Tuesdays, Thursdays, and Saturdays.   Recheck INR on Wednesday 11/02/17 prior to flight back to [place] on 11/03/17.  Does weekly checks due to insurance.

## 2017-10-26 DIAGNOSIS — L308 Other specified dermatitis: Secondary | ICD-10-CM | POA: Diagnosis not present

## 2017-11-02 ENCOUNTER — Ambulatory Visit (INDEPENDENT_AMBULATORY_CARE_PROVIDER_SITE_OTHER): Payer: Medicare Other

## 2017-11-02 DIAGNOSIS — Z5181 Encounter for therapeutic drug level monitoring: Secondary | ICD-10-CM | POA: Diagnosis not present

## 2017-11-02 DIAGNOSIS — I69359 Hemiplegia and hemiparesis following cerebral infarction affecting unspecified side: Secondary | ICD-10-CM

## 2017-11-02 DIAGNOSIS — Z952 Presence of prosthetic heart valve: Secondary | ICD-10-CM | POA: Diagnosis not present

## 2017-11-02 DIAGNOSIS — Z7901 Long term (current) use of anticoagulants: Secondary | ICD-10-CM

## 2017-11-02 LAB — POCT INR: INR: 3.1

## 2017-11-02 NOTE — Patient Instructions (Signed)
Spoke with pt advised to continue taking the same dosage 1 tablet (5mg ) daily except 1.5 tablets (7.5mg ) on Tuesdays, Thursdays, and Saturdays.   Recheck INR  In 1 week.  Does weekly checks due to insurance.

## 2017-11-07 ENCOUNTER — Ambulatory Visit (INDEPENDENT_AMBULATORY_CARE_PROVIDER_SITE_OTHER): Payer: Medicare Other | Admitting: Internal Medicine

## 2017-11-07 DIAGNOSIS — Z5181 Encounter for therapeutic drug level monitoring: Secondary | ICD-10-CM

## 2017-11-07 DIAGNOSIS — Z952 Presence of prosthetic heart valve: Secondary | ICD-10-CM | POA: Diagnosis not present

## 2017-11-07 DIAGNOSIS — I69359 Hemiplegia and hemiparesis following cerebral infarction affecting unspecified side: Secondary | ICD-10-CM

## 2017-11-07 DIAGNOSIS — Z7901 Long term (current) use of anticoagulants: Secondary | ICD-10-CM | POA: Diagnosis not present

## 2017-11-07 LAB — POCT INR: INR: 4.5

## 2017-11-07 NOTE — Patient Instructions (Signed)
Description   Spoke with pt advised to hold today's dose then continue taking the same dosage 1 tablet (5mg ) daily except 1.5 tablets (7.5mg ) on Tuesdays, Thursdays, and Saturdays.   Recheck INR  In 1 week.  Does weekly checks due to insurance.

## 2017-11-14 ENCOUNTER — Ambulatory Visit (INDEPENDENT_AMBULATORY_CARE_PROVIDER_SITE_OTHER): Payer: Medicare Other | Admitting: Cardiology

## 2017-11-14 DIAGNOSIS — Z5181 Encounter for therapeutic drug level monitoring: Secondary | ICD-10-CM | POA: Diagnosis not present

## 2017-11-14 DIAGNOSIS — I69359 Hemiplegia and hemiparesis following cerebral infarction affecting unspecified side: Secondary | ICD-10-CM | POA: Diagnosis not present

## 2017-11-14 DIAGNOSIS — R0789 Other chest pain: Secondary | ICD-10-CM | POA: Diagnosis not present

## 2017-11-14 DIAGNOSIS — Z7901 Long term (current) use of anticoagulants: Secondary | ICD-10-CM

## 2017-11-14 DIAGNOSIS — Z952 Presence of prosthetic heart valve: Secondary | ICD-10-CM | POA: Diagnosis not present

## 2017-11-14 DIAGNOSIS — W010XXA Fall on same level from slipping, tripping and stumbling without subsequent striking against object, initial encounter: Secondary | ICD-10-CM | POA: Diagnosis not present

## 2017-11-14 DIAGNOSIS — S299XXA Unspecified injury of thorax, initial encounter: Secondary | ICD-10-CM | POA: Diagnosis not present

## 2017-11-14 LAB — POCT INR: INR: 2.8

## 2017-11-21 ENCOUNTER — Ambulatory Visit (INDEPENDENT_AMBULATORY_CARE_PROVIDER_SITE_OTHER): Payer: Medicare Other | Admitting: Cardiovascular Disease

## 2017-11-21 DIAGNOSIS — I69359 Hemiplegia and hemiparesis following cerebral infarction affecting unspecified side: Secondary | ICD-10-CM | POA: Diagnosis not present

## 2017-11-21 DIAGNOSIS — Z5181 Encounter for therapeutic drug level monitoring: Secondary | ICD-10-CM

## 2017-11-21 DIAGNOSIS — Z952 Presence of prosthetic heart valve: Secondary | ICD-10-CM

## 2017-11-21 DIAGNOSIS — Z7901 Long term (current) use of anticoagulants: Secondary | ICD-10-CM

## 2017-11-21 LAB — POCT INR: INR: 3.6

## 2017-11-21 NOTE — Patient Instructions (Signed)
Description   Spoke with pt advised to take 1 tablet tomorrow then continue taking the same dosage 1 tablet (5mg ) daily except 1.5 tablets (7.5mg ) on Tuesdays, Thursdays, and Saturdays.   Recheck INR in 1 week.  Does weekly checks due to insurance.

## 2017-11-28 ENCOUNTER — Ambulatory Visit (INDEPENDENT_AMBULATORY_CARE_PROVIDER_SITE_OTHER): Payer: Medicare Other | Admitting: Internal Medicine

## 2017-11-28 DIAGNOSIS — I69359 Hemiplegia and hemiparesis following cerebral infarction affecting unspecified side: Secondary | ICD-10-CM | POA: Diagnosis not present

## 2017-11-28 DIAGNOSIS — Z5181 Encounter for therapeutic drug level monitoring: Secondary | ICD-10-CM | POA: Diagnosis not present

## 2017-11-28 DIAGNOSIS — Z952 Presence of prosthetic heart valve: Secondary | ICD-10-CM

## 2017-11-28 DIAGNOSIS — Z7901 Long term (current) use of anticoagulants: Secondary | ICD-10-CM

## 2017-11-28 LAB — POCT INR: INR: 3.9

## 2017-12-05 ENCOUNTER — Ambulatory Visit (INDEPENDENT_AMBULATORY_CARE_PROVIDER_SITE_OTHER): Payer: Medicare Other | Admitting: Cardiology

## 2017-12-05 DIAGNOSIS — Z7901 Long term (current) use of anticoagulants: Secondary | ICD-10-CM | POA: Diagnosis not present

## 2017-12-05 DIAGNOSIS — Z5181 Encounter for therapeutic drug level monitoring: Secondary | ICD-10-CM

## 2017-12-05 DIAGNOSIS — Z952 Presence of prosthetic heart valve: Secondary | ICD-10-CM | POA: Diagnosis not present

## 2017-12-05 DIAGNOSIS — I69359 Hemiplegia and hemiparesis following cerebral infarction affecting unspecified side: Secondary | ICD-10-CM | POA: Diagnosis not present

## 2017-12-05 LAB — POCT INR: INR: 3.2

## 2017-12-07 DIAGNOSIS — Z23 Encounter for immunization: Secondary | ICD-10-CM | POA: Diagnosis not present

## 2017-12-12 ENCOUNTER — Ambulatory Visit (INDEPENDENT_AMBULATORY_CARE_PROVIDER_SITE_OTHER): Payer: Medicare Other | Admitting: Pharmacist

## 2017-12-12 DIAGNOSIS — I69359 Hemiplegia and hemiparesis following cerebral infarction affecting unspecified side: Secondary | ICD-10-CM

## 2017-12-12 DIAGNOSIS — Z952 Presence of prosthetic heart valve: Secondary | ICD-10-CM

## 2017-12-12 DIAGNOSIS — Z5181 Encounter for therapeutic drug level monitoring: Secondary | ICD-10-CM | POA: Diagnosis not present

## 2017-12-12 DIAGNOSIS — Z7901 Long term (current) use of anticoagulants: Secondary | ICD-10-CM | POA: Diagnosis not present

## 2017-12-12 LAB — POCT INR: INR: 2.5

## 2017-12-19 ENCOUNTER — Ambulatory Visit (INDEPENDENT_AMBULATORY_CARE_PROVIDER_SITE_OTHER): Payer: Medicare Other | Admitting: Cardiovascular Disease

## 2017-12-19 DIAGNOSIS — I69359 Hemiplegia and hemiparesis following cerebral infarction affecting unspecified side: Secondary | ICD-10-CM

## 2017-12-19 DIAGNOSIS — Z5181 Encounter for therapeutic drug level monitoring: Secondary | ICD-10-CM

## 2017-12-19 DIAGNOSIS — Z952 Presence of prosthetic heart valve: Secondary | ICD-10-CM

## 2017-12-19 DIAGNOSIS — Z7901 Long term (current) use of anticoagulants: Secondary | ICD-10-CM | POA: Diagnosis not present

## 2017-12-19 LAB — POCT INR: INR: 4

## 2017-12-19 NOTE — Patient Instructions (Signed)
Description   Spoke with pt advised to take 1/2 tablet today then continue taking 1 tablet (5mg ) daily except 1.5 tablets (7.5mg ) on Tuesdays and Saturdays. Recheck INR in 1 week.  Does weekly checks due to insurance.

## 2017-12-26 ENCOUNTER — Ambulatory Visit (INDEPENDENT_AMBULATORY_CARE_PROVIDER_SITE_OTHER): Payer: Medicare Other | Admitting: Pharmacist

## 2017-12-26 DIAGNOSIS — Z5181 Encounter for therapeutic drug level monitoring: Secondary | ICD-10-CM | POA: Diagnosis not present

## 2017-12-26 DIAGNOSIS — Z7901 Long term (current) use of anticoagulants: Secondary | ICD-10-CM

## 2017-12-26 DIAGNOSIS — I69359 Hemiplegia and hemiparesis following cerebral infarction affecting unspecified side: Secondary | ICD-10-CM | POA: Diagnosis not present

## 2017-12-26 DIAGNOSIS — Z952 Presence of prosthetic heart valve: Secondary | ICD-10-CM | POA: Diagnosis not present

## 2017-12-26 LAB — POCT INR: INR: 3.5

## 2018-01-02 ENCOUNTER — Ambulatory Visit (INDEPENDENT_AMBULATORY_CARE_PROVIDER_SITE_OTHER): Payer: Medicare Other | Admitting: Cardiovascular Disease

## 2018-01-02 DIAGNOSIS — I69359 Hemiplegia and hemiparesis following cerebral infarction affecting unspecified side: Secondary | ICD-10-CM

## 2018-01-02 DIAGNOSIS — Z952 Presence of prosthetic heart valve: Secondary | ICD-10-CM | POA: Diagnosis not present

## 2018-01-02 DIAGNOSIS — Z5181 Encounter for therapeutic drug level monitoring: Secondary | ICD-10-CM

## 2018-01-02 DIAGNOSIS — Z7901 Long term (current) use of anticoagulants: Secondary | ICD-10-CM

## 2018-01-02 LAB — POCT INR: INR: 3.4

## 2018-01-09 ENCOUNTER — Ambulatory Visit (INDEPENDENT_AMBULATORY_CARE_PROVIDER_SITE_OTHER): Payer: Medicare Other | Admitting: Internal Medicine

## 2018-01-09 DIAGNOSIS — Z5181 Encounter for therapeutic drug level monitoring: Secondary | ICD-10-CM | POA: Diagnosis not present

## 2018-01-09 DIAGNOSIS — I69359 Hemiplegia and hemiparesis following cerebral infarction affecting unspecified side: Secondary | ICD-10-CM

## 2018-01-09 DIAGNOSIS — Z8673 Personal history of transient ischemic attack (TIA), and cerebral infarction without residual deficits: Secondary | ICD-10-CM | POA: Diagnosis not present

## 2018-01-09 DIAGNOSIS — Z7901 Long term (current) use of anticoagulants: Secondary | ICD-10-CM

## 2018-01-09 DIAGNOSIS — Z952 Presence of prosthetic heart valve: Secondary | ICD-10-CM

## 2018-01-09 LAB — POCT INR: INR: 3.3

## 2018-01-09 NOTE — Patient Instructions (Signed)
Description   Continue taking 1 tablet (5mg ) daily except 1.5 tablets (7.5mg ) on Tuesdays and Saturdays. Recheck INR in 1 week.  Does weekly checks due to insurance.

## 2018-01-16 ENCOUNTER — Ambulatory Visit (INDEPENDENT_AMBULATORY_CARE_PROVIDER_SITE_OTHER): Payer: Medicare Other | Admitting: Cardiology

## 2018-01-16 DIAGNOSIS — Z5181 Encounter for therapeutic drug level monitoring: Secondary | ICD-10-CM

## 2018-01-16 DIAGNOSIS — Z952 Presence of prosthetic heart valve: Secondary | ICD-10-CM | POA: Diagnosis not present

## 2018-01-16 DIAGNOSIS — Z7901 Long term (current) use of anticoagulants: Secondary | ICD-10-CM

## 2018-01-16 DIAGNOSIS — I69359 Hemiplegia and hemiparesis following cerebral infarction affecting unspecified side: Secondary | ICD-10-CM

## 2018-01-16 LAB — POCT INR: INR: 3.1

## 2018-01-16 NOTE — Patient Instructions (Signed)
Description   Continue taking 1 tablet (5mg ) daily except 1.5 tablets (7.5mg ) on Tuesdays and Saturdays. Recheck INR in 1 week.  Does weekly checks due to insurance.

## 2018-01-23 ENCOUNTER — Ambulatory Visit (INDEPENDENT_AMBULATORY_CARE_PROVIDER_SITE_OTHER): Payer: Medicare Other | Admitting: Internal Medicine

## 2018-01-23 DIAGNOSIS — Z952 Presence of prosthetic heart valve: Secondary | ICD-10-CM

## 2018-01-23 DIAGNOSIS — I69359 Hemiplegia and hemiparesis following cerebral infarction affecting unspecified side: Secondary | ICD-10-CM

## 2018-01-23 DIAGNOSIS — Z5181 Encounter for therapeutic drug level monitoring: Secondary | ICD-10-CM | POA: Diagnosis not present

## 2018-01-23 DIAGNOSIS — Z7901 Long term (current) use of anticoagulants: Secondary | ICD-10-CM

## 2018-01-23 LAB — POCT INR: INR: 3.8

## 2018-01-27 DIAGNOSIS — R233 Spontaneous ecchymoses: Secondary | ICD-10-CM | POA: Diagnosis not present

## 2018-01-27 DIAGNOSIS — Z7901 Long term (current) use of anticoagulants: Secondary | ICD-10-CM | POA: Diagnosis not present

## 2018-01-30 ENCOUNTER — Ambulatory Visit (INDEPENDENT_AMBULATORY_CARE_PROVIDER_SITE_OTHER): Payer: Medicare Other | Admitting: Cardiology

## 2018-01-30 DIAGNOSIS — Z7901 Long term (current) use of anticoagulants: Secondary | ICD-10-CM

## 2018-01-30 DIAGNOSIS — Z952 Presence of prosthetic heart valve: Secondary | ICD-10-CM

## 2018-01-30 DIAGNOSIS — I69359 Hemiplegia and hemiparesis following cerebral infarction affecting unspecified side: Secondary | ICD-10-CM

## 2018-01-30 DIAGNOSIS — Z5181 Encounter for therapeutic drug level monitoring: Secondary | ICD-10-CM

## 2018-01-30 LAB — POCT INR: INR: 2.9

## 2018-02-06 ENCOUNTER — Ambulatory Visit (INDEPENDENT_AMBULATORY_CARE_PROVIDER_SITE_OTHER): Payer: Medicare Other | Admitting: Cardiovascular Disease

## 2018-02-06 DIAGNOSIS — Z952 Presence of prosthetic heart valve: Secondary | ICD-10-CM

## 2018-02-06 DIAGNOSIS — Z5181 Encounter for therapeutic drug level monitoring: Secondary | ICD-10-CM

## 2018-02-06 DIAGNOSIS — I69359 Hemiplegia and hemiparesis following cerebral infarction affecting unspecified side: Secondary | ICD-10-CM

## 2018-02-06 DIAGNOSIS — Z7901 Long term (current) use of anticoagulants: Secondary | ICD-10-CM

## 2018-02-06 LAB — POCT INR: INR: 4.5

## 2018-02-06 NOTE — Patient Instructions (Signed)
Description   Spoke with pt & instructed to hold today's dose then continue taking 1 tablet (5mg ) daily except 1.5 tablets (7.5mg ) on Tuesdays and Saturdays. Recheck INR in 1 week.  Does weekly checks due to insurance.

## 2018-02-13 ENCOUNTER — Ambulatory Visit (INDEPENDENT_AMBULATORY_CARE_PROVIDER_SITE_OTHER): Payer: Medicare Other | Admitting: Internal Medicine

## 2018-02-13 DIAGNOSIS — I69359 Hemiplegia and hemiparesis following cerebral infarction affecting unspecified side: Secondary | ICD-10-CM | POA: Diagnosis not present

## 2018-02-13 DIAGNOSIS — Z952 Presence of prosthetic heart valve: Secondary | ICD-10-CM

## 2018-02-13 DIAGNOSIS — Z7901 Long term (current) use of anticoagulants: Secondary | ICD-10-CM

## 2018-02-13 DIAGNOSIS — Z5181 Encounter for therapeutic drug level monitoring: Secondary | ICD-10-CM | POA: Diagnosis not present

## 2018-02-13 LAB — POCT INR: INR: 3.3

## 2018-02-17 ENCOUNTER — Encounter: Payer: Self-pay | Admitting: Cardiovascular Disease

## 2018-02-20 ENCOUNTER — Ambulatory Visit (INDEPENDENT_AMBULATORY_CARE_PROVIDER_SITE_OTHER): Payer: Medicare Other | Admitting: Internal Medicine

## 2018-02-20 ENCOUNTER — Ambulatory Visit: Payer: Medicare Other | Admitting: Neurology

## 2018-02-20 DIAGNOSIS — Z7901 Long term (current) use of anticoagulants: Secondary | ICD-10-CM

## 2018-02-20 DIAGNOSIS — Z5181 Encounter for therapeutic drug level monitoring: Secondary | ICD-10-CM | POA: Diagnosis not present

## 2018-02-20 DIAGNOSIS — I69359 Hemiplegia and hemiparesis following cerebral infarction affecting unspecified side: Secondary | ICD-10-CM

## 2018-02-20 DIAGNOSIS — Z952 Presence of prosthetic heart valve: Secondary | ICD-10-CM

## 2018-02-20 LAB — POCT INR: INR: 3

## 2018-02-27 ENCOUNTER — Ambulatory Visit (INDEPENDENT_AMBULATORY_CARE_PROVIDER_SITE_OTHER): Payer: Medicare Other | Admitting: Cardiovascular Disease

## 2018-02-27 DIAGNOSIS — I69359 Hemiplegia and hemiparesis following cerebral infarction affecting unspecified side: Secondary | ICD-10-CM | POA: Diagnosis not present

## 2018-02-27 DIAGNOSIS — Z7901 Long term (current) use of anticoagulants: Secondary | ICD-10-CM

## 2018-02-27 DIAGNOSIS — Z5181 Encounter for therapeutic drug level monitoring: Secondary | ICD-10-CM

## 2018-02-27 DIAGNOSIS — Z952 Presence of prosthetic heart valve: Secondary | ICD-10-CM

## 2018-02-27 LAB — POCT INR: INR: 4

## 2018-02-28 ENCOUNTER — Ambulatory Visit (INDEPENDENT_AMBULATORY_CARE_PROVIDER_SITE_OTHER): Payer: Medicare Other | Admitting: Cardiovascular Disease

## 2018-02-28 ENCOUNTER — Encounter: Payer: Self-pay | Admitting: Cardiovascular Disease

## 2018-02-28 VITALS — BP 106/68 | HR 78 | Ht 67.0 in | Wt 132.0 lb

## 2018-02-28 DIAGNOSIS — Z7901 Long term (current) use of anticoagulants: Secondary | ICD-10-CM | POA: Diagnosis not present

## 2018-02-28 DIAGNOSIS — Z8673 Personal history of transient ischemic attack (TIA), and cerebral infarction without residual deficits: Secondary | ICD-10-CM

## 2018-02-28 DIAGNOSIS — Z952 Presence of prosthetic heart valve: Secondary | ICD-10-CM | POA: Diagnosis not present

## 2018-02-28 DIAGNOSIS — H43811 Vitreous degeneration, right eye: Secondary | ICD-10-CM | POA: Diagnosis not present

## 2018-02-28 NOTE — Progress Notes (Signed)
Cardiology Office Note   Date:  02/28/2018   ID:  [name], DOB 30-Oct-1946, MRN [id number]  PCP:  Haywood Pao, MD  Cardiologist:   Mertie Moores, MD   Chief Complaint  Patient presents with  . Hypertension  . mitral valve replacement   Problem List 1. Mitral valve replacement  - at age 72.  2. Essential HTN 3. Hyperlipidemia 4. DM.  5.  Multiple strokes   [name] is a 72 y.o. female who presents for management of her mitral valve replacement . Was seen with her husband [name].  Moved from [place], [place] recently  Has had 4 strokes and a TIA.  She does her own INR monitoring  - the strokes were not associated with marked INR variations Exercises some, not as much as when she lived in [place].   Does water aerobics.  Walks on occasion .  Also does physical therapy exercises.   April 13, 2016:  Doing well.  INRs have been OK. ( 3.2)   She measures her own.  Last week the INR levels were off when she was on Abx for a UTI .   Oct. 4, 2017:  Doing well.  S/p MVR 1997.   INR levels are ok , checks it at home and in our office  2. 9 last week   February 28, 2018: [name] is seen today for follow-up of her mitral valve replacement and hypertension.  She was last seen in our office in October, 2019 by Pecolia Ades,  NP . INR has been theraputuc because of her hx of strokes.  Goal INR is 3.0-3.5     Past Medical History:  Diagnosis Date  . Abnormality of gait 09/07/2016  . Allergy   . Diabetes mellitus without complication [facility])    Patient denies this - notes history of glucose intolerance  . Hemiparesis and alteration of sensations as late effects of stroke ([place]) 09/07/2016  . History of pneumonia 1997  . S/P MVR (mitral valve replacement)    Mechanical mitral valve replacement at age 53 (done in [place])  // echo 7/17: EF 55-60, normal wall motion, bileaflet mechanical mitral valve prosthesis functioning normally, mild LAE, mildly reduced RVSF, small  pericardial effusion  . Stroke [facility]) 1997, 2013, 2015    Past Surgical History:  Procedure Laterality Date  . ABDOMINAL HYSTERECTOMY    . BRAIN SURGERY    . BUNIONECTOMY  1993  . CARDIAC VALVE REPLACEMENT  1997  . TOE SURGERY  1996     Current Outpatient Medications  Medication Sig Dispense Refill  . amLODipine (NORVASC) 10 MG tablet Take 10 mg daily by mouth.    Marland Kitchen aspirin EC 81 MG tablet Take 81 mg by mouth daily.     Marland Kitchen atorvastatin (LIPITOR) 20 MG tablet Take 20 mg by mouth daily.    . benzonatate (TESSALON) 100 MG capsule Take 100 mg by mouth as needed for cough.    . calcium carbonate 1250 MG capsule Take 1,200 mg by mouth 2 (two) times daily with a meal.    . cholecalciferol (VITAMIN D) 1000 UNITS tablet Take 2,000 Units daily by mouth.     . denosumab (PROLIA) 60 MG/ML SOLN injection Inject 60 mg into the skin every 6 (six) months. Administer in upper arm, thigh, or abdomen    . docusate sodium (COLACE) 100 MG capsule Take 100 mg at bedtime by mouth.     Marland Kitchen omeprazole (PRILOSEC) 20 MG capsule Take 20  mg by mouth daily.    Marland Kitchen warfarin (COUMADIN) 5 MG tablet Take 5-7.5 mg every evening by mouth. 7.5 mg on TUES, THURS, & SAT 5 mg on all other days     No current facility-administered medications for this visit.     Allergies:   Zoloft [sertraline]    Social History:  The patient  reports that she has never smoked. She has never used smokeless tobacco. She reports that she does not drink alcohol or use drugs.   Family History:  The patient's family history includes Cancer in her mother; Heart disease in her mother; Hyperlipidemia in her mother; Hypertension in her father and mother; Stroke in her father.    ROS:   Noted in current history, otherwise review of systems is negative.   Physical Exam: Blood pressure 106/68, pulse 78, height 5\' 7"  (1.702 m), weight 132 lb (59.9 kg), SpO2 97 %.  GEN:  Well nourished, well developed in no acute distress HEENT: Normal NECK: No  JVD; No carotid bruits LYMPHATICS: No lymphadenopathy CARDIAC: RR , mechanical S1. , soft systolic murmur  RESPIRATORY:  Clear to auscultation without rales, wheezing or rhonchi  ABDOMEN: Soft, non-tender, non-distended MUSCULOSKELETAL:  No edema; No deformity  SKIN: Warm and dry NEUROLOGIC:  Alert and oriented x 3   EKG:      Recent Labs: 09/24/2017: ALT 23; BUN 28; Creatinine, Ser 0.75; Hemoglobin 12.8; Platelets 165; Potassium 3.6; Sodium 141    Lipid Panel    Component Value Date/Time   CHOL 141 09/24/2017 0500   TRIG 92 09/24/2017 0500   HDL 60 09/24/2017 0500   CHOLHDL 2.4 09/24/2017 0500   VLDL 18 09/24/2017 0500   LDLCALC 63 09/24/2017 0500      Wt Readings from Last 3 Encounters:  02/28/18 132 lb (59.9 kg)  09/27/17 131 lb (59.4 kg)  09/24/17 127 lb 3.3 oz (57.7 kg)      Other studies Reviewed: Additional studies/ records that were reviewed today include:  Review of the above records demonstrates:    ASSESSMENT AND PLAN:  1.   Mitral valve replacement: [name] presents for follow-up of mitral valve placement. She had mechanical mitral valve placed at age 59. She monitors her INR levels at home.     2. Hypertension:    BP is well controlled   3. Frequent falls:   Hx of strokes.   Also has some vertigo symptm Further    Current medicines are reviewed at length with the patient today.  The patient does not have concerns regarding medicines.  The following changes have been made:  no change  Labs/ tests ordered today include:   No orders of the defined types were placed in this encounter.    Disposition:   FU with me in 1 year      Mertie Moores, MD  02/28/2018 9:42 AM    [place], [place], [place]  [address] Phone: [phone]; Fax: [phone]

## 2018-02-28 NOTE — Patient Instructions (Addendum)
Medication Instructions:  Your physician recommends that you continue on your current medications as directed. Please refer to the Current Medication list given to you today.   Labwork: None Ordered   Testing/Procedures: None Ordered   Follow-Up: Your physician wants you to follow-up in: 1 year with Dr. Jacquis Paxton.  You will receive a reminder letter in the mail two months in advance. If you don't receive a letter, please call our office to schedule the follow-up appointment.   If you need a refill on your cardiac medications before your next appointment, please call your pharmacy.   Thank you for choosing [facility]! Michelle Swinyer, RN [phone]     For your  leg edema you  should do  the following 1. Leg elevation - I recommend the Lounge Dr. Leg rest.  See below for details  2. Salt restriction  -  Use potassium chloride instead of regular salt as a salt substitute. 3. Walk regularly 4. Compression hose - [facility] 5. Weight loss    Available on [email or web address] Or  Go to [email or web address]      

## 2018-03-01 ENCOUNTER — Ambulatory Visit (INDEPENDENT_AMBULATORY_CARE_PROVIDER_SITE_OTHER): Payer: Medicare Other | Admitting: Neurology

## 2018-03-01 ENCOUNTER — Other Ambulatory Visit: Payer: Self-pay

## 2018-03-01 ENCOUNTER — Encounter: Payer: Self-pay | Admitting: Neurology

## 2018-03-01 VITALS — BP 113/69 | HR 74 | Ht 67.0 in | Wt 131.0 lb

## 2018-03-01 DIAGNOSIS — R42 Dizziness and giddiness: Secondary | ICD-10-CM

## 2018-03-01 DIAGNOSIS — M21372 Foot drop, left foot: Secondary | ICD-10-CM

## 2018-03-01 NOTE — Progress Notes (Signed)
Reason for visit: Vertigo  [name] is an 72 y.o. female  History of present illness:  [name] is a 72 year old right-handed white female with a history of a prior stroke event of the right brain with a left hemiparesis, she has diabetes and a history of a mitral valve replacement.  She is on chronic Coumadin therapy.  The patient had an event of transient vertigo in the fall 2018, she has not had any similar events since that time.  She does have occasional lightheaded sensations if she stoops or bends over and comes up quickly.  The patient has had one fall since last seen in the winter, she tried to make a turn and then fell over.  The patient has a circumduction gait involving the left leg, she has a foot drop on that side.  She usually uses a cane for ambulation, she was not using the cane when she fell.  She returns to this office for an evaluation.  Past Medical History:  Diagnosis Date  . Abnormality of gait 09/07/2016  . Allergy   . Diabetes mellitus without complication [facility])    Patient denies this - notes history of glucose intolerance  . Hemiparesis and alteration of sensations as late effects of stroke ([place]) 09/07/2016  . History of pneumonia 1997  . S/P MVR (mitral valve replacement)    Mechanical mitral valve replacement at age 7 (done in [place])  // echo 7/17: EF 55-60, normal wall motion, bileaflet mechanical mitral valve prosthesis functioning normally, mild LAE, mildly reduced RVSF, small pericardial effusion  . Stroke [facility]) 1997, 2013, 2015    Past Surgical History:  Procedure Laterality Date  . ABDOMINAL HYSTERECTOMY    . BRAIN SURGERY    . BUNIONECTOMY  1993  . CARDIAC VALVE REPLACEMENT  1997  . TOE SURGERY  1996    Family History  Problem Relation Age of Onset  . Cancer Mother        Bone  . Heart disease Mother   . Hyperlipidemia Mother   . Hypertension Mother   . Stroke Father   . Hypertension Father   . Heart attack Neg Hx     Social  history:  reports that she has never smoked. She has never used smokeless tobacco. She reports that she does not drink alcohol or use drugs.    Allergies  Allergen Reactions  . Zoloft [Sertraline] Hives, Itching and Rash    Medications:  Prior to Admission medications   Medication Sig Start Date End Date Taking? Authorizing Provider  amLODipine (NORVASC) 10 MG tablet Take 10 mg daily by mouth.   Yes [provider]  aspirin EC 81 MG tablet Take 81 mg by mouth daily.    Yes [provider]  atorvastatin (LIPITOR) 20 MG tablet Take 20 mg by mouth daily.   Yes [provider]  benzonatate (TESSALON) 100 MG capsule Take 100 mg by mouth as needed for cough.   Yes [provider]  calcium carbonate 1250 MG capsule Take 1,200 mg by mouth 2 (two) times daily with a meal.   Yes [provider]  cholecalciferol (VITAMIN D) 1000 UNITS tablet Take 2,000 Units daily by mouth.    Yes [provider]  denosumab (PROLIA) 60 MG/ML SOLN injection Inject 60 mg into the skin every 6 (six) months. Administer in upper arm, thigh, or abdomen   Yes [provider]  docusate sodium (COLACE) 100 MG capsule Take 100 mg at  bedtime by mouth.    Yes [provider]  omeprazole (PRILOSEC) 20 MG capsule Take 20 mg by mouth daily.   Yes [provider]  warfarin (COUMADIN) 5 MG tablet Take 5-7.5 mg every evening by mouth. 7.5 mg on TUES, THURS, & SAT 5 mg on all other days   Yes [provider]    ROS:  Out of a complete 14 system review of symptoms, the patient complains only of the following symptoms, and all other reviewed systems are negative.  Bruising easily Gait disturbance  Blood pressure 113/69, pulse 74, height 5\' 7"  (1.702 m), weight 131 lb (59.4 kg).  Physical Exam  General: The patient is alert and cooperative at the time of the examination.  Skin: 1+ edema at the ankles is noted bilaterally.   Neurologic  Exam  Mental status: The patient is alert and oriented x 3 at the time of the examination. The patient has apparent normal recent and remote memory, with an apparently normal attention span and concentration ability.   Cranial nerves: Facial symmetry is present. Speech is slightly dysarthric, not a phasic. Extraocular movements are full. Visual fields are full.  Motor: The patient has good strength in all 4 extremities, with exception of a slight foot drop on the left, increased motor tone on the left arm and left leg.  Sensory examination: Soft touch sensation is symmetric on the face, arms, and legs.  Coordination: The patient has good finger-nose-finger and heel-to-shin on the right, some difficulty with dysmetria with finger-nose-finger on the left, and some difficulty performing heel-to-shin on the left leg.  Gait and station: The patient has a circumduction type gait with a left leg, she holds the left arm in flexion with walking.  Romberg is negative.  Reflexes: Deep tendon reflexes are slightly brisk on the left side relative to the right.   Assessment/Plan:  1.  Left hemiparesis, history of stroke  2.  History of vertigo, no recurrence  3.  Gait disorder, chronic  The patient has a circumduction type gait with a left foot drop, she may benefit from a light weight AFO brace.  She has had a prior AFO brace that was too heavy and too uncomfortable to use.  The proper AFO brace made significant improved her gait stability and safety with walking.  She will follow-up in 1 year.  She has not had any further problems with vertigo.  Jill Alexanders MD 03/01/2018 9:03 AM  [facility] [address] [place], [place] [address]  Phone [phone] Fax [phone]

## 2018-03-01 NOTE — Patient Instructions (Signed)
   We will get an AFO brace for the left foot

## 2018-03-06 ENCOUNTER — Ambulatory Visit (INDEPENDENT_AMBULATORY_CARE_PROVIDER_SITE_OTHER): Payer: Medicare Other | Admitting: Internal Medicine

## 2018-03-06 DIAGNOSIS — Z952 Presence of prosthetic heart valve: Secondary | ICD-10-CM | POA: Diagnosis not present

## 2018-03-06 DIAGNOSIS — Z7901 Long term (current) use of anticoagulants: Secondary | ICD-10-CM

## 2018-03-06 DIAGNOSIS — I69359 Hemiplegia and hemiparesis following cerebral infarction affecting unspecified side: Secondary | ICD-10-CM

## 2018-03-06 DIAGNOSIS — Z5181 Encounter for therapeutic drug level monitoring: Secondary | ICD-10-CM

## 2018-03-06 LAB — POCT INR: INR: 3.5

## 2018-03-13 ENCOUNTER — Ambulatory Visit (INDEPENDENT_AMBULATORY_CARE_PROVIDER_SITE_OTHER): Payer: Medicare Other | Admitting: Internal Medicine

## 2018-03-13 ENCOUNTER — Telehealth: Payer: Self-pay | Admitting: *Deleted

## 2018-03-13 DIAGNOSIS — I69359 Hemiplegia and hemiparesis following cerebral infarction affecting unspecified side: Secondary | ICD-10-CM

## 2018-03-13 DIAGNOSIS — Z7901 Long term (current) use of anticoagulants: Secondary | ICD-10-CM

## 2018-03-13 DIAGNOSIS — Z952 Presence of prosthetic heart valve: Secondary | ICD-10-CM | POA: Diagnosis not present

## 2018-03-13 DIAGNOSIS — Z5181 Encounter for therapeutic drug level monitoring: Secondary | ICD-10-CM

## 2018-03-13 LAB — POCT INR: INR: 2.7

## 2018-03-13 NOTE — Telephone Encounter (Signed)
Received fax request from [facility] asking for last office notes required for insurance authorization for AFO brace. I faxed recent office notes to [phone] attn: Colletta [place] as requested. Received fax confirmation.

## 2018-03-13 NOTE — Patient Instructions (Signed)
Description   Spoke with pt & instructed to take 1.5 tablets today then continue taking 1 tablet (5mg ) daily except 1.5 tablets (7.5mg ) on Tuesdays and Saturdays. Pt will increase her florets to 6-7 each Friday.  Recheck INR in 1 week.  Does weekly checks due to insurance.

## 2018-03-16 NOTE — Telephone Encounter (Signed)
Mailed signed order re: AFO brace for left foot drop. ICD10: M21.372. Daily use, length of need: lifetime. Shipping address: [facility] [place], [place] [address].

## 2018-03-20 ENCOUNTER — Ambulatory Visit (INDEPENDENT_AMBULATORY_CARE_PROVIDER_SITE_OTHER): Payer: Medicare Other | Admitting: Internal Medicine

## 2018-03-20 DIAGNOSIS — Z5181 Encounter for therapeutic drug level monitoring: Secondary | ICD-10-CM

## 2018-03-20 DIAGNOSIS — I69359 Hemiplegia and hemiparesis following cerebral infarction affecting unspecified side: Secondary | ICD-10-CM

## 2018-03-20 DIAGNOSIS — Z952 Presence of prosthetic heart valve: Secondary | ICD-10-CM | POA: Diagnosis not present

## 2018-03-20 DIAGNOSIS — Z7901 Long term (current) use of anticoagulants: Secondary | ICD-10-CM

## 2018-03-20 LAB — POCT INR: INR: 3.4

## 2018-03-20 NOTE — Patient Instructions (Signed)
Description   Spoke with pt & instructed to take 1.5 tablets today then continue taking 1 tablet (5mg ) daily except 1.5 tablets (7.5mg ) on Tuesdays and Saturdays. Pt will increase her florets to 6-7 each Friday.  Recheck INR in 1 week.  Does weekly checks due to insurance.

## 2018-03-21 DIAGNOSIS — E7800 Pure hypercholesterolemia, unspecified: Secondary | ICD-10-CM | POA: Diagnosis not present

## 2018-03-21 DIAGNOSIS — R7301 Impaired fasting glucose: Secondary | ICD-10-CM | POA: Diagnosis not present

## 2018-03-21 DIAGNOSIS — R82998 Other abnormal findings in urine: Secondary | ICD-10-CM | POA: Diagnosis not present

## 2018-03-21 DIAGNOSIS — M810 Age-related osteoporosis without current pathological fracture: Secondary | ICD-10-CM | POA: Diagnosis not present

## 2018-03-21 DIAGNOSIS — I10 Essential (primary) hypertension: Secondary | ICD-10-CM | POA: Diagnosis not present

## 2018-03-27 ENCOUNTER — Ambulatory Visit (INDEPENDENT_AMBULATORY_CARE_PROVIDER_SITE_OTHER): Payer: Medicare Other | Admitting: Cardiology

## 2018-03-27 DIAGNOSIS — Z952 Presence of prosthetic heart valve: Secondary | ICD-10-CM | POA: Diagnosis not present

## 2018-03-27 DIAGNOSIS — Z5181 Encounter for therapeutic drug level monitoring: Secondary | ICD-10-CM | POA: Diagnosis not present

## 2018-03-27 DIAGNOSIS — I69359 Hemiplegia and hemiparesis following cerebral infarction affecting unspecified side: Secondary | ICD-10-CM

## 2018-03-27 DIAGNOSIS — Z7901 Long term (current) use of anticoagulants: Secondary | ICD-10-CM

## 2018-03-27 LAB — POCT INR: INR: 3.9 — AB (ref 2.0–3.0)

## 2018-03-27 NOTE — Patient Instructions (Signed)
Description   Spoke with pt & instructed her to take 1/2 tablet (2.5mg ) of coumadin  today May 20th then  continue taking 1 tablet (5mg ) daily except 1.5 tablets (7.5mg ) on Tuesdays and Saturdays. Pt will continue eating 6-7 florets each Friday.  Recheck INR in 1 week.  Does weekly checks due to insurance. Instructed to eat extra amt of broccoli today

## 2018-03-29 ENCOUNTER — Other Ambulatory Visit: Payer: Self-pay | Admitting: Pharmacist

## 2018-03-29 DIAGNOSIS — Z6820 Body mass index (BMI) 20.0-20.9, adult: Secondary | ICD-10-CM | POA: Diagnosis not present

## 2018-03-29 DIAGNOSIS — Z1389 Encounter for screening for other disorder: Secondary | ICD-10-CM | POA: Diagnosis not present

## 2018-03-29 DIAGNOSIS — Z0000 Encounter for general adult medical examination without abnormal findings: Secondary | ICD-10-CM | POA: Diagnosis not present

## 2018-03-29 DIAGNOSIS — M810 Age-related osteoporosis without current pathological fracture: Secondary | ICD-10-CM | POA: Diagnosis not present

## 2018-03-29 DIAGNOSIS — K219 Gastro-esophageal reflux disease without esophagitis: Secondary | ICD-10-CM | POA: Diagnosis not present

## 2018-03-29 DIAGNOSIS — I69998 Other sequelae following unspecified cerebrovascular disease: Secondary | ICD-10-CM | POA: Diagnosis not present

## 2018-03-29 DIAGNOSIS — I10 Essential (primary) hypertension: Secondary | ICD-10-CM | POA: Diagnosis not present

## 2018-03-29 DIAGNOSIS — Z7901 Long term (current) use of anticoagulants: Secondary | ICD-10-CM | POA: Diagnosis not present

## 2018-03-29 DIAGNOSIS — Z952 Presence of prosthetic heart valve: Secondary | ICD-10-CM | POA: Diagnosis not present

## 2018-03-29 DIAGNOSIS — E7800 Pure hypercholesterolemia, unspecified: Secondary | ICD-10-CM | POA: Diagnosis not present

## 2018-03-29 DIAGNOSIS — I6789 Other cerebrovascular disease: Secondary | ICD-10-CM | POA: Diagnosis not present

## 2018-03-29 DIAGNOSIS — D692 Other nonthrombocytopenic purpura: Secondary | ICD-10-CM | POA: Diagnosis not present

## 2018-03-29 MED ORDER — WARFARIN SODIUM 5 MG PO TABS: ORAL_TABLET | ORAL | 3 refills | Status: DC

## 2018-04-04 ENCOUNTER — Ambulatory Visit (INDEPENDENT_AMBULATORY_CARE_PROVIDER_SITE_OTHER): Payer: Medicare Other | Admitting: Cardiovascular Disease

## 2018-04-04 DIAGNOSIS — Z7901 Long term (current) use of anticoagulants: Secondary | ICD-10-CM | POA: Diagnosis not present

## 2018-04-04 DIAGNOSIS — Z952 Presence of prosthetic heart valve: Secondary | ICD-10-CM | POA: Diagnosis not present

## 2018-04-04 DIAGNOSIS — I69359 Hemiplegia and hemiparesis following cerebral infarction affecting unspecified side: Secondary | ICD-10-CM

## 2018-04-04 DIAGNOSIS — Z5181 Encounter for therapeutic drug level monitoring: Secondary | ICD-10-CM

## 2018-04-04 LAB — POCT INR: INR: 2.9 (ref 2.0–3.0)

## 2018-04-06 ENCOUNTER — Telehealth: Payer: Self-pay | Admitting: Neurology

## 2018-04-06 NOTE — Telephone Encounter (Signed)
Pt called said she the AFO has helped tremendously and with the pain in the thigh also. She states she feels woozy when she goes to bed after taking off AFO. If getting up during the night she feels unbalanced, she does not use it during the night. Pt said she has not used her cane in the house yet. She is concerned about the feeling light headed. Please call to advise.

## 2018-04-06 NOTE — Telephone Encounter (Signed)
I called the patient.  The patient has a sensation of wooziness when she tries to go the bathroom at night when she is not using her AFO brace.  She may also have the same sensation when she is outside and it is very when the she may have a similar sensation.  This likely represents a sensation of imbalance, I have recommended using a cane or a walker to get to the bathroom at night to help this issue.

## 2018-04-10 ENCOUNTER — Ambulatory Visit (INDEPENDENT_AMBULATORY_CARE_PROVIDER_SITE_OTHER): Payer: Medicare Other | Admitting: Cardiology

## 2018-04-10 DIAGNOSIS — Z5181 Encounter for therapeutic drug level monitoring: Secondary | ICD-10-CM | POA: Diagnosis not present

## 2018-04-10 DIAGNOSIS — Z7901 Long term (current) use of anticoagulants: Secondary | ICD-10-CM

## 2018-04-10 DIAGNOSIS — Z952 Presence of prosthetic heart valve: Secondary | ICD-10-CM | POA: Diagnosis not present

## 2018-04-10 DIAGNOSIS — I69359 Hemiplegia and hemiparesis following cerebral infarction affecting unspecified side: Secondary | ICD-10-CM

## 2018-04-10 LAB — POCT INR: INR: 3.7 — AB (ref 2.0–3.0)

## 2018-04-10 NOTE — Patient Instructions (Signed)
Description   Spoke with pt & instructed her to take 1/2 tablet (2.5mg ) of coumadin today then continue taking 1 tablet (5mg ) daily except 1.5 tablets (7.5mg ) on Tuesdays and Saturdays. Pt will continue eating 6-7 florets each Friday.  Recheck INR in 1 week.  Does weekly checks due to insurance. Instructed to eat extra amt of broccoli today

## 2018-04-17 ENCOUNTER — Ambulatory Visit (INDEPENDENT_AMBULATORY_CARE_PROVIDER_SITE_OTHER): Payer: Medicare Other | Admitting: Cardiology

## 2018-04-17 DIAGNOSIS — Z952 Presence of prosthetic heart valve: Secondary | ICD-10-CM

## 2018-04-17 DIAGNOSIS — I69359 Hemiplegia and hemiparesis following cerebral infarction affecting unspecified side: Secondary | ICD-10-CM | POA: Diagnosis not present

## 2018-04-17 DIAGNOSIS — Z5181 Encounter for therapeutic drug level monitoring: Secondary | ICD-10-CM | POA: Diagnosis not present

## 2018-04-17 DIAGNOSIS — Z7901 Long term (current) use of anticoagulants: Secondary | ICD-10-CM

## 2018-04-17 LAB — POCT INR: INR: 3.6 — AB (ref 2.0–3.0)

## 2018-04-20 DIAGNOSIS — Z1212 Encounter for screening for malignant neoplasm of rectum: Secondary | ICD-10-CM | POA: Diagnosis not present

## 2018-04-24 ENCOUNTER — Ambulatory Visit (INDEPENDENT_AMBULATORY_CARE_PROVIDER_SITE_OTHER): Payer: Medicare Other | Admitting: Cardiovascular Disease

## 2018-04-24 DIAGNOSIS — Z952 Presence of prosthetic heart valve: Secondary | ICD-10-CM | POA: Diagnosis not present

## 2018-04-24 DIAGNOSIS — Z5181 Encounter for therapeutic drug level monitoring: Secondary | ICD-10-CM

## 2018-04-24 DIAGNOSIS — I69359 Hemiplegia and hemiparesis following cerebral infarction affecting unspecified side: Secondary | ICD-10-CM

## 2018-04-24 DIAGNOSIS — Z7901 Long term (current) use of anticoagulants: Secondary | ICD-10-CM | POA: Diagnosis not present

## 2018-04-24 LAB — POCT INR: INR: 2.2 (ref 2.0–3.0)

## 2018-05-01 ENCOUNTER — Ambulatory Visit (INDEPENDENT_AMBULATORY_CARE_PROVIDER_SITE_OTHER): Payer: Medicare Other | Admitting: Cardiovascular Disease

## 2018-05-01 DIAGNOSIS — I69359 Hemiplegia and hemiparesis following cerebral infarction affecting unspecified side: Secondary | ICD-10-CM

## 2018-05-01 DIAGNOSIS — Z8673 Personal history of transient ischemic attack (TIA), and cerebral infarction without residual deficits: Secondary | ICD-10-CM | POA: Diagnosis not present

## 2018-05-01 DIAGNOSIS — Z5181 Encounter for therapeutic drug level monitoring: Secondary | ICD-10-CM

## 2018-05-01 DIAGNOSIS — Z952 Presence of prosthetic heart valve: Secondary | ICD-10-CM | POA: Diagnosis not present

## 2018-05-01 DIAGNOSIS — Z7901 Long term (current) use of anticoagulants: Secondary | ICD-10-CM

## 2018-05-01 LAB — POCT INR: INR: 2.5 (ref 2.0–3.0)

## 2018-05-01 NOTE — Patient Instructions (Signed)
Description   Spoke with pt & instructed her to take to take 1.5 tablets today June 24th then change coumadin dose to  1 tablet (5mg ) daily except 1.5 tablets (7.5mg ) on Tuesdays. And Fridays Pt will continue eating 6-7 florets each Friday.  Recheck INR in 1 week.  Does weekly checks due to insurance. Instructed to eat extra amt of broccoli today

## 2018-05-08 ENCOUNTER — Ambulatory Visit (INDEPENDENT_AMBULATORY_CARE_PROVIDER_SITE_OTHER): Payer: Medicare Other | Admitting: Interventional Cardiology

## 2018-05-08 ENCOUNTER — Encounter: Payer: Self-pay | Admitting: Cardiovascular Disease

## 2018-05-08 DIAGNOSIS — Z952 Presence of prosthetic heart valve: Secondary | ICD-10-CM | POA: Diagnosis not present

## 2018-05-08 DIAGNOSIS — Z5181 Encounter for therapeutic drug level monitoring: Secondary | ICD-10-CM | POA: Diagnosis not present

## 2018-05-08 DIAGNOSIS — I69359 Hemiplegia and hemiparesis following cerebral infarction affecting unspecified side: Secondary | ICD-10-CM | POA: Diagnosis not present

## 2018-05-08 DIAGNOSIS — Z7901 Long term (current) use of anticoagulants: Secondary | ICD-10-CM

## 2018-05-08 LAB — POCT INR: INR: 3.9 — AB (ref 2.0–3.0)

## 2018-05-08 LAB — PROTIME-INR

## 2018-05-08 NOTE — Patient Instructions (Signed)
Description   Spoke with pt & instructed her to skip her Coumadin today, then continue taking Coumadin dose to  1 tablet (5mg ) daily except 1.5 tablets (7.5mg ) on Tuesdays and Fridays. Pt will continue eating 6-7 florets each Friday.  Recheck INR in 1 week.  Does weekly checks due to insurance.

## 2018-05-10 DIAGNOSIS — D225 Melanocytic nevi of trunk: Secondary | ICD-10-CM | POA: Diagnosis not present

## 2018-05-10 DIAGNOSIS — L820 Inflamed seborrheic keratosis: Secondary | ICD-10-CM | POA: Diagnosis not present

## 2018-05-10 DIAGNOSIS — L728 Other follicular cysts of the skin and subcutaneous tissue: Secondary | ICD-10-CM | POA: Diagnosis not present

## 2018-05-10 DIAGNOSIS — Z1283 Encounter for screening for malignant neoplasm of skin: Secondary | ICD-10-CM | POA: Diagnosis not present

## 2018-05-15 ENCOUNTER — Ambulatory Visit (INDEPENDENT_AMBULATORY_CARE_PROVIDER_SITE_OTHER): Payer: Medicare Other | Admitting: Pharmacist

## 2018-05-15 DIAGNOSIS — Z7901 Long term (current) use of anticoagulants: Secondary | ICD-10-CM | POA: Diagnosis not present

## 2018-05-15 DIAGNOSIS — I69359 Hemiplegia and hemiparesis following cerebral infarction affecting unspecified side: Secondary | ICD-10-CM | POA: Diagnosis not present

## 2018-05-15 DIAGNOSIS — Z5181 Encounter for therapeutic drug level monitoring: Secondary | ICD-10-CM

## 2018-05-15 DIAGNOSIS — Z952 Presence of prosthetic heart valve: Secondary | ICD-10-CM | POA: Diagnosis not present

## 2018-05-15 LAB — POCT INR: INR: 3.3 — AB (ref 2.0–3.0)

## 2018-05-17 NOTE — Telephone Encounter (Signed)
Dr. Krista Blue has signed a written prescription for bilateral orthotics (ICD: R26.9, M21.372).  I spoke to the patient and she would like to pick up the prescription.  It has been placed up front for her.  Copy of [facility] prescription sent for scanning to her chart.

## 2018-05-17 NOTE — Telephone Encounter (Signed)
Patient requesting an order for a new set of orthotics that will fit on her AFO brace.  Please send to [facility] as in previous message.

## 2018-05-22 ENCOUNTER — Ambulatory Visit (INDEPENDENT_AMBULATORY_CARE_PROVIDER_SITE_OTHER): Payer: Medicare Other | Admitting: Cardiology

## 2018-05-22 DIAGNOSIS — I69359 Hemiplegia and hemiparesis following cerebral infarction affecting unspecified side: Secondary | ICD-10-CM

## 2018-05-22 DIAGNOSIS — Z952 Presence of prosthetic heart valve: Secondary | ICD-10-CM

## 2018-05-22 DIAGNOSIS — Z5181 Encounter for therapeutic drug level monitoring: Secondary | ICD-10-CM | POA: Diagnosis not present

## 2018-05-22 DIAGNOSIS — E119 Type 2 diabetes mellitus without complications: Secondary | ICD-10-CM | POA: Diagnosis not present

## 2018-05-22 DIAGNOSIS — Z7901 Long term (current) use of anticoagulants: Secondary | ICD-10-CM | POA: Diagnosis not present

## 2018-05-22 DIAGNOSIS — H5203 Hypermetropia, bilateral: Secondary | ICD-10-CM | POA: Diagnosis not present

## 2018-05-22 LAB — POCT INR: INR: 3.3 — AB (ref 2.0–3.0)

## 2018-05-29 ENCOUNTER — Ambulatory Visit (INDEPENDENT_AMBULATORY_CARE_PROVIDER_SITE_OTHER): Payer: Medicare Other | Admitting: Internal Medicine

## 2018-05-29 DIAGNOSIS — Z5181 Encounter for therapeutic drug level monitoring: Secondary | ICD-10-CM | POA: Diagnosis not present

## 2018-05-29 DIAGNOSIS — Z952 Presence of prosthetic heart valve: Secondary | ICD-10-CM | POA: Diagnosis not present

## 2018-05-29 DIAGNOSIS — Z7901 Long term (current) use of anticoagulants: Secondary | ICD-10-CM

## 2018-05-29 DIAGNOSIS — I69359 Hemiplegia and hemiparesis following cerebral infarction affecting unspecified side: Secondary | ICD-10-CM

## 2018-05-29 LAB — POCT INR: INR: 3.6 — AB (ref 2.0–3.0)

## 2018-06-05 ENCOUNTER — Ambulatory Visit (INDEPENDENT_AMBULATORY_CARE_PROVIDER_SITE_OTHER): Payer: Medicare Other | Admitting: Internal Medicine

## 2018-06-05 DIAGNOSIS — Z952 Presence of prosthetic heart valve: Secondary | ICD-10-CM

## 2018-06-05 DIAGNOSIS — Z7901 Long term (current) use of anticoagulants: Secondary | ICD-10-CM

## 2018-06-05 DIAGNOSIS — I69359 Hemiplegia and hemiparesis following cerebral infarction affecting unspecified side: Secondary | ICD-10-CM

## 2018-06-05 DIAGNOSIS — Z5181 Encounter for therapeutic drug level monitoring: Secondary | ICD-10-CM

## 2018-06-05 LAB — POCT INR: INR: 3 (ref 2.0–3.0)

## 2018-06-05 NOTE — Patient Instructions (Signed)
Description   Spoke with pt & instructed her to continue taking 1 tablet (5mg ) daily except 1.5 tablets (7.5mg ) on Tuesdays and Fridays. Pt will continue eating 6-7 florets each Friday.  Recheck INR in 1 week.  Does weekly checks due to insurance.

## 2018-06-12 ENCOUNTER — Ambulatory Visit (INDEPENDENT_AMBULATORY_CARE_PROVIDER_SITE_OTHER): Payer: Medicare Other | Admitting: Internal Medicine

## 2018-06-12 DIAGNOSIS — Z952 Presence of prosthetic heart valve: Secondary | ICD-10-CM

## 2018-06-12 DIAGNOSIS — Z5181 Encounter for therapeutic drug level monitoring: Secondary | ICD-10-CM

## 2018-06-12 DIAGNOSIS — I69359 Hemiplegia and hemiparesis following cerebral infarction affecting unspecified side: Secondary | ICD-10-CM

## 2018-06-12 DIAGNOSIS — Z7901 Long term (current) use of anticoagulants: Secondary | ICD-10-CM

## 2018-06-12 LAB — POCT INR: INR: 2.9 (ref 2.0–3.0)

## 2018-06-19 ENCOUNTER — Ambulatory Visit (INDEPENDENT_AMBULATORY_CARE_PROVIDER_SITE_OTHER): Payer: Medicare Other | Admitting: Cardiology

## 2018-06-19 DIAGNOSIS — Z952 Presence of prosthetic heart valve: Secondary | ICD-10-CM | POA: Diagnosis not present

## 2018-06-19 DIAGNOSIS — Z7901 Long term (current) use of anticoagulants: Secondary | ICD-10-CM | POA: Diagnosis not present

## 2018-06-19 DIAGNOSIS — I69359 Hemiplegia and hemiparesis following cerebral infarction affecting unspecified side: Secondary | ICD-10-CM

## 2018-06-19 DIAGNOSIS — Z5181 Encounter for therapeutic drug level monitoring: Secondary | ICD-10-CM

## 2018-06-19 LAB — POCT INR: INR: 2.7 (ref 2.0–3.0)

## 2018-06-26 ENCOUNTER — Ambulatory Visit (INDEPENDENT_AMBULATORY_CARE_PROVIDER_SITE_OTHER): Payer: Medicare Other | Admitting: Pharmacist

## 2018-06-26 DIAGNOSIS — Z952 Presence of prosthetic heart valve: Secondary | ICD-10-CM

## 2018-06-26 DIAGNOSIS — Z7901 Long term (current) use of anticoagulants: Secondary | ICD-10-CM

## 2018-06-26 DIAGNOSIS — I69359 Hemiplegia and hemiparesis following cerebral infarction affecting unspecified side: Secondary | ICD-10-CM

## 2018-06-26 DIAGNOSIS — Z5181 Encounter for therapeutic drug level monitoring: Secondary | ICD-10-CM | POA: Diagnosis not present

## 2018-06-26 LAB — POCT INR: INR: 4.8 — AB (ref 2.0–3.0)

## 2018-07-03 ENCOUNTER — Ambulatory Visit (INDEPENDENT_AMBULATORY_CARE_PROVIDER_SITE_OTHER): Payer: Medicare Other | Admitting: Cardiovascular Disease

## 2018-07-03 DIAGNOSIS — Z7901 Long term (current) use of anticoagulants: Secondary | ICD-10-CM | POA: Diagnosis not present

## 2018-07-03 DIAGNOSIS — Z952 Presence of prosthetic heart valve: Secondary | ICD-10-CM | POA: Diagnosis not present

## 2018-07-03 DIAGNOSIS — Z5181 Encounter for therapeutic drug level monitoring: Secondary | ICD-10-CM | POA: Diagnosis not present

## 2018-07-03 DIAGNOSIS — I69359 Hemiplegia and hemiparesis following cerebral infarction affecting unspecified side: Secondary | ICD-10-CM | POA: Diagnosis not present

## 2018-07-03 LAB — POCT INR: INR: 3.7 — AB (ref 2.0–3.0)

## 2018-07-03 NOTE — Patient Instructions (Signed)
Description   Spoke with pt & instructed her to take 1 tablet today then continue 1 tablet (5mg ) daily except 1.5 tablets on Mondays, Wednesdays and Fridays. Pt to eat 8-9 florets on Friday & then Pt will continue eating 6-7 florets each Friday.  Recheck INR in 1 week.  Does weekly checks due to insurance.

## 2018-07-11 ENCOUNTER — Telehealth: Payer: Self-pay | Admitting: Cardiovascular Disease

## 2018-07-11 ENCOUNTER — Ambulatory Visit (INDEPENDENT_AMBULATORY_CARE_PROVIDER_SITE_OTHER): Payer: Medicare Other | Admitting: Cardiology

## 2018-07-11 DIAGNOSIS — I69359 Hemiplegia and hemiparesis following cerebral infarction affecting unspecified side: Secondary | ICD-10-CM

## 2018-07-11 DIAGNOSIS — Z5181 Encounter for therapeutic drug level monitoring: Secondary | ICD-10-CM | POA: Diagnosis not present

## 2018-07-11 DIAGNOSIS — Z7901 Long term (current) use of anticoagulants: Secondary | ICD-10-CM

## 2018-07-11 DIAGNOSIS — Z952 Presence of prosthetic heart valve: Secondary | ICD-10-CM

## 2018-07-11 LAB — POCT INR: INR: 5 — AB (ref 2.0–3.0)

## 2018-07-11 NOTE — Telephone Encounter (Signed)
New Message    Legrand Como with [facility] is calling toreport a critical INR.

## 2018-07-11 NOTE — Telephone Encounter (Signed)
Telephoned [employer] and spoke with female customer service rep who closed out the encounter since the doctor's office has the INR result.

## 2018-07-15 DIAGNOSIS — Z23 Encounter for immunization: Secondary | ICD-10-CM | POA: Diagnosis not present

## 2018-07-17 ENCOUNTER — Ambulatory Visit (INDEPENDENT_AMBULATORY_CARE_PROVIDER_SITE_OTHER): Payer: Medicare Other | Admitting: Interventional Cardiology

## 2018-07-17 DIAGNOSIS — Z952 Presence of prosthetic heart valve: Secondary | ICD-10-CM | POA: Diagnosis not present

## 2018-07-17 DIAGNOSIS — Z5181 Encounter for therapeutic drug level monitoring: Secondary | ICD-10-CM

## 2018-07-17 DIAGNOSIS — I69359 Hemiplegia and hemiparesis following cerebral infarction affecting unspecified side: Secondary | ICD-10-CM

## 2018-07-17 DIAGNOSIS — Z7901 Long term (current) use of anticoagulants: Secondary | ICD-10-CM

## 2018-07-17 LAB — POCT INR: INR: 2.1 (ref 2.0–3.0)

## 2018-07-18 DIAGNOSIS — Z6821 Body mass index (BMI) 21.0-21.9, adult: Secondary | ICD-10-CM | POA: Diagnosis not present

## 2018-07-18 DIAGNOSIS — Z7901 Long term (current) use of anticoagulants: Secondary | ICD-10-CM | POA: Diagnosis not present

## 2018-07-18 DIAGNOSIS — M810 Age-related osteoporosis without current pathological fracture: Secondary | ICD-10-CM | POA: Diagnosis not present

## 2018-07-18 DIAGNOSIS — R2689 Other abnormalities of gait and mobility: Secondary | ICD-10-CM | POA: Diagnosis not present

## 2018-07-18 DIAGNOSIS — I6789 Other cerebrovascular disease: Secondary | ICD-10-CM | POA: Diagnosis not present

## 2018-07-18 DIAGNOSIS — I69998 Other sequelae following unspecified cerebrovascular disease: Secondary | ICD-10-CM | POA: Diagnosis not present

## 2018-07-18 DIAGNOSIS — Z952 Presence of prosthetic heart valve: Secondary | ICD-10-CM | POA: Diagnosis not present

## 2018-07-24 ENCOUNTER — Ambulatory Visit (INDEPENDENT_AMBULATORY_CARE_PROVIDER_SITE_OTHER): Payer: Medicare Other | Admitting: Internal Medicine

## 2018-07-24 DIAGNOSIS — I69359 Hemiplegia and hemiparesis following cerebral infarction affecting unspecified side: Secondary | ICD-10-CM | POA: Diagnosis not present

## 2018-07-24 DIAGNOSIS — Z5181 Encounter for therapeutic drug level monitoring: Secondary | ICD-10-CM

## 2018-07-24 DIAGNOSIS — Z952 Presence of prosthetic heart valve: Secondary | ICD-10-CM | POA: Diagnosis not present

## 2018-07-24 DIAGNOSIS — Z7901 Long term (current) use of anticoagulants: Secondary | ICD-10-CM

## 2018-07-24 LAB — POCT INR: INR: 2.7 (ref 2.0–3.0)

## 2018-07-24 NOTE — Patient Instructions (Signed)
Description   Spoke with pt & instructed her to take 7.5mg s tomorrow then continue taking 5mg  daily except 7.5mg  on Mondays and Fridays.   Recheck INR in 1 week.  Does weekly checks due to insurance.

## 2018-07-31 ENCOUNTER — Ambulatory Visit (INDEPENDENT_AMBULATORY_CARE_PROVIDER_SITE_OTHER): Payer: Medicare Other

## 2018-07-31 ENCOUNTER — Ambulatory Visit: Payer: Medicare Other | Attending: Internal Medicine | Admitting: Physical Therapy

## 2018-07-31 ENCOUNTER — Other Ambulatory Visit: Payer: Self-pay | Admitting: Cardiovascular Disease

## 2018-07-31 ENCOUNTER — Other Ambulatory Visit: Payer: Self-pay

## 2018-07-31 ENCOUNTER — Encounter: Payer: Self-pay | Admitting: Physical Therapy

## 2018-07-31 DIAGNOSIS — R2681 Unsteadiness on feet: Secondary | ICD-10-CM | POA: Diagnosis not present

## 2018-07-31 DIAGNOSIS — R261 Paralytic gait: Secondary | ICD-10-CM

## 2018-07-31 DIAGNOSIS — M6281 Muscle weakness (generalized): Secondary | ICD-10-CM | POA: Insufficient documentation

## 2018-07-31 DIAGNOSIS — Z952 Presence of prosthetic heart valve: Secondary | ICD-10-CM | POA: Diagnosis not present

## 2018-07-31 DIAGNOSIS — I69359 Hemiplegia and hemiparesis following cerebral infarction affecting unspecified side: Secondary | ICD-10-CM

## 2018-07-31 DIAGNOSIS — Z7901 Long term (current) use of anticoagulants: Secondary | ICD-10-CM

## 2018-07-31 DIAGNOSIS — Z5181 Encounter for therapeutic drug level monitoring: Secondary | ICD-10-CM | POA: Diagnosis not present

## 2018-07-31 LAB — POCT INR: INR: 2.9 (ref 2.0–3.0)

## 2018-07-31 NOTE — Therapy (Signed)
[facility] [address]  [place], [place], [address] Phone: [phone]   Fax:  [phone]  Physical Therapy Evaluation  Patient Details  Name: [name] MRN: [id number] Date of Birth: 03-29-1946 Referring Provider: Domenick Gong, MD   Encounter Date: 07/31/2018  PT End of Session - 07/31/18 1118    Visit Number  1    Number of Visits  17    Date for PT Re-Evaluation  09/25/18    Authorization Type  Medicare    PT Start Time  1011    PT Stop Time  1106    PT Time Calculation (min)  55 min    Activity Tolerance  Patient tolerated treatment well    Behavior During Therapy  [facility] for tasks assessed/performed       Past Medical History:  Diagnosis Date  . Abnormality of gait 09/07/2016  . Allergy   . Diabetes mellitus without complication [facility])    Patient denies this - notes history of glucose intolerance  . Hemiparesis and alteration of sensations as late effects of stroke ([place]) 09/07/2016  . History of pneumonia 1997  . S/P MVR (mitral valve replacement)    Mechanical mitral valve replacement at age 62 (done in [place])  // echo 7/17: EF 55-60, normal wall motion, bileaflet mechanical mitral valve prosthesis functioning normally, mild LAE, mildly reduced RVSF, small pericardial effusion  . Stroke [facility]) 1997, 2013, 2015    Past Surgical History:  Procedure Laterality Date  . ABDOMINAL HYSTERECTOMY    . BRAIN SURGERY    . BUNIONECTOMY  1993  . CARDIAC VALVE REPLACEMENT  1997  . TOE SURGERY  1996    There were no vitals filed for this visit.   Subjective Assessment - 07/31/18 1013    Subjective  Patient reports she has had 4 strokes. Most recent was in Sept 2015- was the 2nd worst one. Recent stroke caused L hand to be fixed in clawed position and had a lot of issues holding things and opening things. Still notes some weakness in L hand. Had significant LE weakness from stroke in 1997.  Has been  wearing AFO since May 2019 since she had been dragging her toes and falling. Has been having issues with anxiety in open spaces and walking on slick surfaces; doesn't feel secure since the stroke. Reports gait instability and anxiety has been a problem for the last 6-9 months. Has had 2 recent falls- stepping down off a curb and turning to water a plant. Would like to work on confidence with walking and being more independent, not needing to furniture walk, or walk depending on husband.     Limitations  House hold activities;Walking;Standing    Diagnostic tests  none recent    Patient Stated Goals  getting more confidence with walking     Currently in Pain?  Yes    Pain Score  3     Pain Location  Back    Pain Orientation  Lower;Left;Right    Pain Type  Chronic pain    Multiple Pain Sites  Yes    Pain Score  2    Pain Location  Leg   L anterior thigh   Pain Orientation  Left    Pain Descriptors / Indicators  Tightness    Pain Type  Chronic pain    Pain Relieving Factors  tylenol         [facility] PT Assessment - 07/31/18 1026  Assessment   Medical Diagnosis  Gait imbalance    Referring Provider  Domenick Gong, MD    Onset Date/Surgical Date  --   Sept 2015   Hand Dominance  Right    Next MD Visit  --   unsure of date   Prior Therapy  Yes- after strokes      Precautions   Precautions  Fall   on Coumadin     Restrictions   Weight Bearing Restrictions  No      Balance Screen   Has the patient fallen in the past 6 months  Yes   stepping down curb and turning   How many times?  2    Has the patient had a decrease in activity level because of a fear of falling?   Yes    Is the patient reluctant to leave their home because of a fear of falling?   No      Home Environment   Living Environment  Private residence    Living Arrangements  Spouse/significant other    Available Help at Discharge  Family    Type of [place] Access  Level entry    [place]  Two level    Alternate Level Stairs-Number of Steps  12    Alternate [place] - single point;Cane - quad      Prior Function   Level of Independence  Independent with basic ADLs   husband helps with standing tasks   Vocation  Retired    Leisure  gardening, reading, walking      Cognition   Overall Cognitive Status  Within Functional Limits for tasks assessed      Sensation   Light Touch  Appears Intact   intermittent L hand N/T   Proprioception  Impaired by gross assessment   report of difficult percention in space of L LE     Coordination   Gross Motor Movements are Fluid and Coordinated  No   limited by imbalance and discoordination     Posture/Postural Control   Posture/Postural Control  Postural limitations    Postural Limitations  Rounded Shoulders;Forward head;Increased thoracic kyphosis      ROM / Strength   AROM / PROM / Strength  Strength      Strength   Strength Assessment Site  Hip;Knee;Ankle    Right/Left Hip  Right;Left    Right Hip Flexion  4/5    Right Hip ABduction  4/5    Right Hip ADduction  4/5    Left Hip Flexion  4-/5    Left Hip ABduction  4-/5    Left Hip ADduction  4/5    Right/Left Knee  Right;Left    Right Knee Flexion  4+/5    Right Knee Extension  4+/5    Left Knee Flexion  4/5    Left Knee Extension  4+/5    Right/Left Ankle  Right;Left    Right Ankle Dorsiflexion  4+/5    Right Ankle Plantar Flexion  4+/5    Left Ankle Dorsiflexion  --   AFO   Left Ankle Plantar Flexion  --   limited by AFO     Flexibility   Soft Tissue Assessment /Muscle Length  yes   L hip flexor mild tight   Hamstrings  B mod tight      Palpation   Palpation comment  TTP L ant  thigh       Transfers   Transfers  Sit to Stand    Sit to Stand  6: Modified independent (Device/Increase time)   poor foot placement and anterior chest translation     Ambulation/Gait   Assistive device  Straight cane     Gait Pattern  Step-to pattern;Decreased stance time - left;Decreased hip/knee flexion - left;Decreased weight shift to left;Left circumduction;Antalgic;Poor foot clearance - left    Ambulation Surface  Level;Indoor    Gait velocity  severely decreased      Standardized Balance Assessment   Standardized Balance Assessment  Timed Up and Go Test;Five Times Sit to Stand    Five times sit to stand comments   32.1   without UE support     Timed Up and Go Test   TUG  Normal TUG    Normal TUG (seconds)  31.2   with chair with armrests and SPC               Objective measurements completed on examination: See above findings.              PT Education - 07/31/18 1117    Education Details  HEP, POC, prognosis     Person(s) Educated  Patient    Methods  Explanation;Demonstration;Tactile cues;Handout    Comprehension  Verbalized understanding;Returned demonstration       PT Short Term Goals - 07/31/18 1127      PT SHORT TERM GOAL #1   Title  Patient to be independent with initial HEP.    Time  4    Period  Weeks    Status  New    Target Date  08/28/18        PT Long Term Goals - 07/31/18 1128      PT LONG TERM GOAL #1   Title  Patient to be independent with advanced HEP.    Time  8    Period  Weeks    Status  New    Target Date  09/25/18      PT LONG TERM GOAL #2   Title  Patient to demonstrate 4+/5 strength in B hips to encourage proper gait pattern.    Time  8    Period  Weeks    Status  New    Target Date  09/25/18      PT LONG TERM GOAL #3   Title  Patient to score <15 sec on 5xSTS without UE support to decrease risk of falls.    Time  8    Period  Weeks    Status  New    Target Date  09/25/18      PT LONG TERM GOAL #4   Title  Patient to score <14 sec on TUG with LRAD to decrease risk of falls.     Time  8    Period  Weeks    Status  New    Target Date  09/25/18      PT LONG TERM GOAL #5   Title  Patient to report 75% increased  confidence in ability to ambulate on slick level surfaces with LRAD.    Time  8    Period  Weeks    Status  New    Target Date  09/25/18             Plan - 07/31/18 1118    Clinical Impression Statement  Patient is a 72y/o F with hx of 4 CVAs  presenting to OPPT with c/o fear of falling, imbalance, and difficulty walking. Patient ambulates around home with L AFO and no AD, uses SPC in the community. Reports 2 recent falls- stepping down off a curb and turning to water a plant. Notes that she has become fearful of walking without assistance whether it be a family member or furniture walking, especially on slick surfaces and in wide open spaces.  Patient today with limited L LE strength, decreased HS flexibility, gait deviations, difficulty with STS transfers, imbalance, TTP in L anterior thigh. Educated and instructed patient on stretching and STS HEP. Patient reported understanding. Would benefit from skilled PT services 2x/week for 8 weeks to address aforementioned impairments.     Clinical Presentation  Stable    Clinical Decision Making  Low    Rehab Potential  Good    PT Frequency  2x / week    PT Duration  8 weeks    PT Treatment/Interventions  ADLs/Self Care Home Management;Cryotherapy;Electrical Stimulation;Moist Heat;Ultrasound;DME Instruction;Gait training;Stair training;Functional mobility training;Therapeutic activities;Therapeutic exercise;Manual techniques;Orthotic Fit/Training;Patient/family education;Neuromuscular re-education;Balance training;Passive range of motion;Dry needling;Energy conservation;Splinting;Taping    PT Next Visit Plan  review HEP    Consulted and Agree with Plan of Care  Patient       Patient will benefit from skilled therapeutic intervention in order to improve the following deficits and impairments:  Abnormal gait, Decreased endurance, Decreased activity tolerance, Decreased strength, Pain, Decreased balance, Decreased mobility, Difficulty walking,  Postural dysfunction, Impaired flexibility  Visit Diagnosis: Muscle weakness (generalized)  Paralytic gait  Unsteadiness on feet     Problem List Patient Active Problem List   Diagnosis Date Noted  . Vertigo 09/24/2017  . Benign paroxysmal positional vertigo   . History of stroke   . Hyperlipemia 08/23/2017  . Encounter for therapeutic drug monitoring 08/08/2017  . Long term (current) use of anticoagulants [Z79.01] 07/12/2017  . Hemiparesis and alteration of sensations as late effects of stroke ([place]) 09/07/2016  . Abnormality of gait 09/07/2016  . HTN (hypertension) 04/13/2016  . H/O mitral valve replacement with mechanical valve 10/16/2015  . Diabetes mellitus without complication ([place])   . 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  [place], [place], [address] Phone: [phone]   Fax:  [phone]  Name: [name] MRN: [id number] Date of Birth: 04-Mar-1946

## 2018-07-31 NOTE — Patient Instructions (Signed)
Description   Spoke with pt & instructed her to take 10mg s today, then start taking 5mg  daily except 7.5mg  on Mondays, Wednesdays and Fridays.   Recheck INR in 1 week.  Does weekly checks due to insurance.

## 2018-08-02 ENCOUNTER — Ambulatory Visit: Payer: Medicare Other

## 2018-08-02 DIAGNOSIS — R2681 Unsteadiness on feet: Secondary | ICD-10-CM | POA: Diagnosis not present

## 2018-08-02 DIAGNOSIS — R261 Paralytic gait: Secondary | ICD-10-CM | POA: Diagnosis not present

## 2018-08-02 DIAGNOSIS — M6281 Muscle weakness (generalized): Secondary | ICD-10-CM | POA: Diagnosis not present

## 2018-08-02 NOTE — Therapy (Addendum)
[facility] [address]  [place], [place], [address] Phone: [phone]   Fax:  [phone]  Physical Therapy Treatment  Patient Details  Name: [name] MRN: [id number] Date of Birth: 04-30-1946 Referring Provider: Domenick Gong, MD   Encounter Date: 08/02/2018  PT End of Session - 08/02/18 1024    Visit Number  2    Number of Visits  17    Date for PT Re-Evaluation  09/25/18    Authorization Type  Medicare    PT Start Time  1024    PT Stop Time  1120    PT Time Calculation (min)  56 min    Activity Tolerance  Patient tolerated treatment well    Behavior During Therapy  [facility] for tasks assessed/performed       Past Medical History:  Diagnosis Date  . Abnormality of gait 09/07/2016  . Allergy   . Diabetes mellitus without complication [facility])    Patient denies this - notes history of glucose intolerance  . Hemiparesis and alteration of sensations as late effects of stroke ([place]) 09/07/2016  . History of pneumonia 1997  . S/P MVR (mitral valve replacement)    Mechanical mitral valve replacement at age 63 (done in [place])  // echo 7/17: EF 55-60, normal wall motion, bileaflet mechanical mitral valve prosthesis functioning normally, mild LAE, mildly reduced RVSF, small pericardial effusion  . Stroke [facility]) 1997, 2013, 2015    Past Surgical History:  Procedure Laterality Date  . ABDOMINAL HYSTERECTOMY    . BRAIN SURGERY    . BUNIONECTOMY  1993  . CARDIAC VALVE REPLACEMENT  1997  . TOE SURGERY  1996    There were no vitals filed for this visit.  Subjective Assessment - 08/02/18 1028    Subjective  Pt. noting she wishes to review the HEP stretches with belt.      Diagnostic tests  none recent    Patient Stated Goals  getting more confidence with walking     Currently in Pain?  No/denies    Pain Score  0-No pain   up to 5/10 in mornings   Pain Location  Leg    Pain Orientation  Left;Lateral    Pain  Descriptors / Indicators  --   "tight" "hard"   Pain Type  Chronic pain    Aggravating Factors   Unsure     Pain Relieving Factors  Unsure     Multiple Pain Sites  Yes    Pain Score  0   up to 6/10 back pain in mornings   Pain Location  Back    Pain Orientation  Lower;Right;Left    Pain Type  Chronic pain    Aggravating Factors   bending, walking                       [facility] Adult PT Treatment/Exercise - 08/02/18 1059      Self-Care   Self-Care  Other Self-Care Comments    Other Self-Care Comments   Discussed proper hand positioning and safety with transfers as to improve safety; Discussed purchasing stretch out strap for LE stretching at home with picture issued to pt. from [facility]      Knee/Hip Exercises: Stretches   Passive Hamstring Stretch  Left;2 reps;30 seconds    Passive Hamstring Stretch Limitations  supine with belt and strap    pt. preferring strap; picture of "stretch out strap" issued  Gastroc Stretch  Left;1 rep;30 seconds    Gastroc Stretch Limitations  with strap seated       Knee/Hip Exercises: Aerobic   Nustep  Lvl 3, 7 min       Knee/Hip Exercises: Standing   Hip Flexion  Right;Left;10 reps;Knee bent;Stengthening    Hip Flexion Limitations  with alternating rubber ball toe touch     Other Standing Knee Exercises  Standing R/L wt. shift with slight LE clearance x 10 reps with mirror feedback to assist awareness of midline wt. distribution       Knee/Hip Exercises: Seated   Long Arc Quad  Right;Left;10 reps;Strengthening    Long Arc Quad Limitations  adduction ball squeeze     Clamshell with TheraBand  Red   2 x 10 reps    Sit to [employer]  2 sets;5 reps             PT Education - 08/02/18 1129    Education Details  HEP update     Person(s) Educated  Patient    Methods  Explanation;Demonstration;Verbal cues;Handout    Comprehension  Verbalized understanding;Returned demonstration;Verbal cues required;Need further instruction        PT Short Term Goals - 07/31/18 1127      PT SHORT TERM GOAL #1   Title  Patient to be independent with initial HEP.    Time  4    Period  Weeks    Status  New    Target Date  08/28/18        PT Long Term Goals - 08/02/18 1147      PT LONG TERM GOAL #1   Title  Patient to be independent with advanced HEP.    Time  8    Period  Weeks    Status  On-going      PT LONG TERM GOAL #2   Title  Patient to demonstrate 4+/5 strength in B hips to encourage proper gait pattern.    Time  8    Period  Weeks    Status  On-going      PT LONG TERM GOAL #3   Title  Patient to score <15 sec on 5xSTS without UE support to decrease risk of falls.    Time  8    Period  Weeks    Status  On-going      PT LONG TERM GOAL #4   Title  Patient to score <14 sec on TUG with LRAD to decrease risk of falls.     Time  8    Period  Weeks    Status  On-going      PT LONG TERM GOAL #5   Title  Patient to report 75% increased confidence in ability to ambulate on slick level surfaces with LRAD.    Time  8    Period  Weeks    Status  On-going            Plan - 08/02/18 1024    Clinical Impression Statement  [name] doing well today however did require cueing with HS stretch and calf stretch as to ensure proper muscular stretch.  Initiated standing wt. shifting activities, sit<>stand for LE strengthening which were tolerated well.  Did required some cueing for symmetrical wt. shift with sit<>stand and proper hand placement to prevent excessive posterior wt. shift for improved safety.  HEP update.  Will monitor response to updated HEP and continue training in safe transfers in coming visits.  PT Treatment/Interventions  ADLs/Self Care Home Management;Cryotherapy;Electrical Stimulation;Moist Heat;Ultrasound;DME Instruction;Gait training;Stair training;Functional mobility training;Therapeutic activities;Therapeutic exercise;Manual techniques;Orthotic Fit/Training;Patient/family  education;Neuromuscular re-education;Balance training;Passive range of motion;Dry needling;Energy conservation;Splinting;Taping    Consulted and Agree with Plan of Care  Patient       Patient will benefit from skilled therapeutic intervention in order to improve the following deficits and impairments:  Abnormal gait, Decreased endurance, Decreased activity tolerance, Decreased strength, Pain, Decreased balance, Decreased mobility, Difficulty walking, Postural dysfunction, Impaired flexibility  Visit Diagnosis: Muscle weakness (generalized)  Paralytic gait  Unsteadiness on feet     Problem List Patient Active Problem List   Diagnosis Date Noted  . Vertigo 09/24/2017  . Benign paroxysmal positional vertigo   . History of stroke   . Hyperlipemia 08/23/2017  . Encounter for therapeutic drug monitoring 08/08/2017  . Long term (current) use of anticoagulants [Z79.01] 07/12/2017  . Hemiparesis and alteration of sensations as late effects of stroke ([place]) 09/07/2016  . Abnormality of gait 09/07/2016  . HTN (hypertension) 04/13/2016  . H/O mitral valve replacement with mechanical valve 10/16/2015  . Diabetes mellitus without complication ([place])   . 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

## 2018-08-04 ENCOUNTER — Telehealth: Payer: Self-pay | Admitting: Neurology

## 2018-08-04 NOTE — Telephone Encounter (Signed)
The numbness of the left foot is residual from a stroke, I do not know of any shot but will bring this back.  The patient seems understand, she indicates that there is no reason for a revisit in January as scheduled.

## 2018-08-04 NOTE — Telephone Encounter (Signed)
RN call patient about left toe does not feel the same. Pt stated she had stroke 20 years ago. She stated her right toes on her foot are okay. Pt stated her left toes are not numb or tingling. When she touch her left toes she can feel it. There is not drainage or color change with her left foot or toes. PT stated the toes feel weaker than usual,and it has been going on for a long time. She heard there could be some shot to reverse sympts. RN stated message will be sent to Dr. Jannifer Franklin.Pt had already made an appt with Dr. Eugenie Birks on 11/2018 for this issue. PT verbalized understanding. RN stated message will be sent to him.

## 2018-08-04 NOTE — Telephone Encounter (Signed)
Pt states that on her left toe only her big toe has feeling.  Pt states she has heard there may be shots of some kind that she can get.  Pt has accepted 1st available appointment and is on wait list.  Pt would like a call if she can be seen before.  Pt is asking for a call to discuss the feeling in her foot

## 2018-08-07 ENCOUNTER — Ambulatory Visit: Payer: Medicare Other

## 2018-08-07 ENCOUNTER — Ambulatory Visit (INDEPENDENT_AMBULATORY_CARE_PROVIDER_SITE_OTHER): Payer: Medicare Other

## 2018-08-07 DIAGNOSIS — Z952 Presence of prosthetic heart valve: Secondary | ICD-10-CM | POA: Diagnosis not present

## 2018-08-07 DIAGNOSIS — M6281 Muscle weakness (generalized): Secondary | ICD-10-CM

## 2018-08-07 DIAGNOSIS — Z5181 Encounter for therapeutic drug level monitoring: Secondary | ICD-10-CM

## 2018-08-07 DIAGNOSIS — R2681 Unsteadiness on feet: Secondary | ICD-10-CM | POA: Diagnosis not present

## 2018-08-07 DIAGNOSIS — Z7901 Long term (current) use of anticoagulants: Secondary | ICD-10-CM | POA: Diagnosis not present

## 2018-08-07 DIAGNOSIS — I69359 Hemiplegia and hemiparesis following cerebral infarction affecting unspecified side: Secondary | ICD-10-CM

## 2018-08-07 DIAGNOSIS — R261 Paralytic gait: Secondary | ICD-10-CM | POA: Diagnosis not present

## 2018-08-07 LAB — POCT INR: INR: 3.1 — AB (ref 2.0–3.0)

## 2018-08-07 NOTE — Patient Instructions (Addendum)
  Description   Spoke with pt & instructed her to continue on same dosage 5mg daily except 7.5mg on Mondays, Wednesdays and Fridays. Recheck INR in 1 week.  Does weekly checks due to insurance.     

## 2018-08-07 NOTE — Therapy (Signed)
[facility] [address]  [place], [place], [address] Phone: [phone]   Fax:  [phone]  Physical Therapy Treatment  Patient Details  Name: [name] MRN: [id number] Date of Birth: [birth year] Referring Provider (PT): Domenick Gong, MD   Encounter Date: 08/07/2018  PT End of Session - 08/07/18 1318    Visit Number  3    Number of Visits  17    Date for PT Re-Evaluation  09/25/18    Authorization Type  Medicare    PT Start Time  7673    PT Stop Time  1401    PT Time Calculation (min)  48 min    Activity Tolerance  Patient tolerated treatment well    Behavior During Therapy  [facility] for tasks assessed/performed       Past Medical History:  Diagnosis Date  . Abnormality of gait 09/07/2016  . Allergy   . Diabetes mellitus without complication [facility])    Patient denies this - notes history of glucose intolerance  . Hemiparesis and alteration of sensations as late effects of stroke ([place]) 09/07/2016  . History of pneumonia 1997  . S/P MVR (mitral valve replacement)    Mechanical mitral valve replacement at age 43 (done in [place])  // echo 7/17: EF 55-60, normal wall motion, bileaflet mechanical mitral valve prosthesis functioning normally, mild LAE, mildly reduced RVSF, small pericardial effusion  . Stroke [facility]) 1997, 2013, 2015    Past Surgical History:  Procedure Laterality Date  . ABDOMINAL HYSTERECTOMY    . BRAIN SURGERY    . BUNIONECTOMY  1993  . CARDIAC VALVE REPLACEMENT  1997  . TOE SURGERY  1996    There were no vitals filed for this visit.  Subjective Assessment - 08/07/18 1315    Subjective  Pt. doing well today.  Pt. purchased and has been using "stretch out strap".      Diagnostic tests  none recent    Patient Stated Goals  getting more confidence with walking     Currently in Pain?  No/denies    Pain Score  0-No pain    Multiple Pain Sites  No                        [facility] Adult PT Treatment/Exercise - 08/07/18 1351      Neuro Re-ed    Neuro Re-ed Details   Alternating L LE step over half foam bolster with L UE reach to cone on white bolster with CGA/supervision from therapist x 10 reps; pt. very apprehensive of falling; R/L side stepping at mat table with L UE wt. bearing HH assist from therapist x 3 laps down/back      Knee/Hip Exercises: Aerobic   Nustep  Lvl 3, 7 min       Knee/Hip Exercises: Standing   Knee Flexion  Right;Left;Strengthening;10 reps    Knee Flexion Limitations  at chair     Hip Flexion  Right;Left;10 reps;Knee bent;Stengthening    Hip Flexion Limitations  at chair     Other Standing Knee Exercises  Alternating toe touch to rubber bubbles on floor with SPC x 10 reps     Other Standing Knee Exercises  Standing R/L wt. shift with slight LE clearance x 10 reps with mirror feedback to assist awareness of midline wt. distribution       Knee/Hip Exercises: Seated   Sit to Sand  2 sets;5 reps;without UE support  from airex pad and mirror feedback for wt. evenly on LE's            PT Education - 08/07/18 1407    Education Details  HEP update     Person(s) Educated  Patient    Methods  Explanation;Demonstration;Verbal cues;Handout    Comprehension  Verbalized understanding;Returned demonstration;Verbal cues required;Need further instruction       PT Short Term Goals - 08/07/18 1319      PT SHORT TERM GOAL #1   Title  Patient to be independent with initial HEP.    Time  4    Period  Weeks    Status  On-going        PT Long Term Goals - 08/02/18 1147      PT LONG TERM GOAL #1   Title  Patient to be independent with advanced HEP.    Time  8    Period  Weeks    Status  On-going      PT LONG TERM GOAL #2   Title  Patient to demonstrate 4+/5 strength in B hips to encourage proper gait pattern.    Time  8    Period  Weeks    Status  On-going      PT LONG TERM GOAL #3   Title   Patient to score <15 sec on 5xSTS without UE support to decrease risk of falls.    Time  8    Period  Weeks    Status  On-going      PT LONG TERM GOAL #4   Title  Patient to score <14 sec on TUG with LRAD to decrease risk of falls.     Time  8    Period  Weeks    Status  On-going      PT LONG TERM GOAL #5   Title  Patient to report 75% increased confidence in ability to ambulate on slick level surfaces with LRAD.    Time  8    Period  Weeks    Status  On-going            Plan - 08/07/18 1319    Clinical Impression Statement  Session focusing on standing balance and LE strengthening activities with cueing for even wt. bearing throughout standing activities.  Pt. still demonstrating decreased awareness of midline with standing activities with R wt. shift and decreased use of L UE.  Tolerated all activities in session well today and demonstrated improved awareness of transfer safety avoiding excessive posterior wt. shift.  Progressing well toward goals and requesting to work on navigating curbs in future visits.      PT Treatment/Interventions  ADLs/Self Care Home Management;Cryotherapy;Electrical Stimulation;Moist Heat;Ultrasound;DME Instruction;Gait training;Stair training;Functional mobility training;Therapeutic activities;Therapeutic exercise;Manual techniques;Orthotic Fit/Training;Patient/family education;Neuromuscular re-education;Balance training;Passive range of motion;Dry needling;Energy conservation;Splinting;Taping    Consulted and Agree with Plan of Care  Patient       Patient will benefit from skilled therapeutic intervention in order to improve the following deficits and impairments:  Abnormal gait, Decreased endurance, Decreased activity tolerance, Decreased strength, Pain, Decreased balance, Decreased mobility, Difficulty walking, Postural dysfunction, Impaired flexibility  Visit Diagnosis: Muscle weakness (generalized)  Paralytic gait  Unsteadiness on  feet     Problem List Patient Active Problem List   Diagnosis Date Noted  . Vertigo 09/24/2017  . Benign paroxysmal positional vertigo   . History of stroke   . Hyperlipemia 08/23/2017  . Encounter for therapeutic drug monitoring 08/08/2017  . Long term (current)  use of anticoagulants [Z79.01] 07/12/2017  . Hemiparesis and alteration of sensations as late effects of stroke ([place]) 09/07/2016  . Abnormality of gait 09/07/2016  . HTN (hypertension) 04/13/2016  . H/O mitral valve replacement with mechanical valve 10/16/2015  . Diabetes mellitus without complication ([place])   . 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  [place], [place], [address] Phone: [phone]   Fax:  [phone]  Name: [name] MRN: [id number] Date of Birth: 1945/11/12

## 2018-08-14 ENCOUNTER — Telehealth: Payer: Self-pay | Admitting: Cardiovascular Disease

## 2018-08-14 ENCOUNTER — Ambulatory Visit (INDEPENDENT_AMBULATORY_CARE_PROVIDER_SITE_OTHER): Payer: Medicare Other | Admitting: Pharmacist

## 2018-08-14 DIAGNOSIS — I69359 Hemiplegia and hemiparesis following cerebral infarction affecting unspecified side: Secondary | ICD-10-CM | POA: Diagnosis not present

## 2018-08-14 DIAGNOSIS — Z952 Presence of prosthetic heart valve: Secondary | ICD-10-CM | POA: Diagnosis not present

## 2018-08-14 DIAGNOSIS — Z5181 Encounter for therapeutic drug level monitoring: Secondary | ICD-10-CM | POA: Diagnosis not present

## 2018-08-14 DIAGNOSIS — Z7901 Long term (current) use of anticoagulants: Secondary | ICD-10-CM

## 2018-08-14 LAB — POCT INR: INR: 3.5 — AB (ref 2.0–3.0)

## 2018-08-14 NOTE — Telephone Encounter (Signed)
New Message     *STAT* If patient is at the pharmacy, call can be transferred to refill team.   1. Which medications need to be refilled? (please list name of each medication and dose if known) warfarin (COUMADIN) 5 MG tablet  2. Which pharmacy/location (including street and city if local pharmacy) is medication to be sent to?[id number] - [place], [place] - [name]  3. Do they need a 30 day or 90 day supply? 90   Patient is calling because she needs a new rx sent to the pharmacy that states that she can not have any substitution she only wants the brand.

## 2018-08-14 NOTE — Patient Instructions (Signed)
Description   Spoke with pt & instructed her to continue on same dosage 5mg daily except 7.5mg on Mondays, Wednesdays and Fridays. Recheck INR in 1 week.  Does weekly checks due to insurance.     

## 2018-08-15 MED ORDER — WARFARIN SODIUM 5 MG PO TABS: ORAL_TABLET | ORAL | 0 refills | Status: DC

## 2018-08-15 NOTE — Telephone Encounter (Signed)
Sent in refill for 90 day supply

## 2018-08-16 ENCOUNTER — Ambulatory Visit: Payer: Medicare Other | Attending: Internal Medicine | Admitting: Physical Therapy

## 2018-08-16 ENCOUNTER — Encounter: Payer: Self-pay | Admitting: Physical Therapy

## 2018-08-16 DIAGNOSIS — R261 Paralytic gait: Secondary | ICD-10-CM | POA: Diagnosis not present

## 2018-08-16 DIAGNOSIS — M6281 Muscle weakness (generalized): Secondary | ICD-10-CM | POA: Insufficient documentation

## 2018-08-16 DIAGNOSIS — R2681 Unsteadiness on feet: Secondary | ICD-10-CM

## 2018-08-16 NOTE — Therapy (Signed)
[facility] [address]  [place], [place], [address] Phone: [phone]   Fax:  [phone]  Physical Therapy Treatment  Patient Details  Name: [name] MRN: [id number] Date of Birth: 30-Aug-1946 Referring Provider (PT): Domenick Gong, MD   Encounter Date: 08/16/2018  PT End of Session - 08/16/18 1152    Visit Number  4    Number of Visits  17    Date for PT Re-Evaluation  09/25/18    Authorization Type  Medicare    PT Start Time  1055    PT Stop Time  1150    PT Time Calculation (min)  55 min    Activity Tolerance  Patient tolerated treatment well;Patient limited by pain    Behavior During Therapy  [facility] for tasks assessed/performed       Past Medical History:  Diagnosis Date  . Abnormality of gait 09/07/2016  . Allergy   . Diabetes mellitus without complication [facility])    Patient denies this - notes history of glucose intolerance  . Hemiparesis and alteration of sensations as late effects of stroke ([place]) 09/07/2016  . History of pneumonia 1997  . S/P MVR (mitral valve replacement)    Mechanical mitral valve replacement at age 27 (done in [place])  // echo 7/17: EF 55-60, normal wall motion, bileaflet mechanical mitral valve prosthesis functioning normally, mild LAE, mildly reduced RVSF, small pericardial effusion  . Stroke [facility]) 1997, 2013, 2015    Past Surgical History:  Procedure Laterality Date  . ABDOMINAL HYSTERECTOMY    . BRAIN SURGERY    . BUNIONECTOMY  1993  . CARDIAC VALVE REPLACEMENT  1997  . TOE SURGERY  1996    There were no vitals filed for this visit.  Subjective Assessment - 08/16/18 1059    Subjective  Reports she had some friends over and had a good time. HEP has been going okay- hardest one is kicking her butt. Got a stretching strap.     Diagnostic tests  none recent    Patient Stated Goals  getting more confidence with walking     Currently in Pain?  Yes    Pain Score  2      Pain Location  Knee    Pain Orientation  Left    Pain Descriptors / Indicators  Tightness    Pain Type  Chronic pain                       [facility] Adult PT Treatment/Exercise - 08/16/18 0001      Exercises   Exercises  Knee/Hip      Knee/Hip Exercises: Stretches   ITB Stretch  Left;2 reps;20 seconds;Limitations    Other Knee/Hip Stretches  KTOS L LE 2x20"      Knee/Hip Exercises: Aerobic   Nustep  Lvl 3, 6 min    L LE wrapped      Knee/Hip Exercises: Standing   Other Standing Knee Exercises  L sided weight shift 15x3" with mirror   cues to shift to patient's limit   Other Standing Knee Exercises  L weight shift + R foot step up on 4" step with mirror 10x   cues for weight shift and coordination of movement     Knee/Hip Exercises: Seated   Clamshell with TheraBand  Red   2x10; cues to keep feet together   Other Seated Knee/Hip Exercises  sitting brisk anterior trunk lean to encourage initiation  of STS x10    Sit to Sand  10 reps;without UE support;2 sets   2nd set with red TB around waist to increase L weight shiift            PT Education - 08/16/18 1151    Education Details  review of HEP; administered red TB    Person(s) Educated  Patient    Methods  Explanation;Demonstration;Tactile cues;Verbal cues    Comprehension  Verbalized understanding;Returned demonstration       PT Short Term Goals - 08/07/18 1319      PT SHORT TERM GOAL #1   Title  Patient to be independent with initial HEP.    Time  4    Period  Weeks    Status  On-going        PT Long Term Goals - 08/02/18 1147      PT LONG TERM GOAL #1   Title  Patient to be independent with advanced HEP.    Time  8    Period  Weeks    Status  On-going      PT LONG TERM GOAL #2   Title  Patient to demonstrate 4+/5 strength in B hips to encourage proper gait pattern.    Time  8    Period  Weeks    Status  On-going      PT LONG TERM GOAL #3   Title  Patient to score <15 sec on  5xSTS without UE support to decrease risk of falls.    Time  8    Period  Weeks    Status  On-going      PT LONG TERM GOAL #4   Title  Patient to score <14 sec on TUG with LRAD to decrease risk of falls.     Time  8    Period  Weeks    Status  On-going      PT LONG TERM GOAL #5   Title  Patient to report 75% increased confidence in ability to ambulate on slick level surfaces with LRAD.    Time  8    Period  Weeks    Status  On-going            Plan - 08/16/18 1152    Clinical Impression Statement  Patient reporting some difficulty with sitting clamshell exercise. Reviewed this exercise with cues to keep feet together and with looped band. Patient noting improvement in ease of exercise after review. Spent considerable amount of time working on STS transfers. Patient with excessive slowness and segmented movement with this transfer. Improved anterior trunk lean and swiftness of movement after performing sitting brisk anterior lean forward. Use of TB improved L weight shift with STS. Patient very fearful of standing L weight shift and stepping activities. Improvement of performance after use of mirror for visual feedback and cues for sequencing and coordination of movement. Patient noting L sided lateral glute/TFL pain after standing activities. Addressed this with TFL stretch in supine. Reviewed HEP and administered red TB at end of session. Patient noting mild L hip pain at end of session but tolerable.     PT Treatment/Interventions  ADLs/Self Care Home Management;Cryotherapy;Electrical Stimulation;Moist Heat;Ultrasound;DME Instruction;Gait training;Stair training;Functional mobility training;Therapeutic activities;Therapeutic exercise;Manual techniques;Orthotic Fit/Training;Patient/family education;Neuromuscular re-education;Balance training;Passive range of motion;Dry needling;Energy conservation;Splinting;Taping    PT Next Visit Plan  curb navigation outside    Consulted and Agree with  Plan of Care  Patient       Patient will benefit from  skilled therapeutic intervention in order to improve the following deficits and impairments:  Abnormal gait, Decreased endurance, Decreased activity tolerance, Decreased strength, Pain, Decreased balance, Decreased mobility, Difficulty walking, Postural dysfunction, Impaired flexibility  Visit Diagnosis: Muscle weakness (generalized)  Paralytic gait  Unsteadiness on feet     Problem List Patient Active Problem List   Diagnosis Date Noted  . Vertigo 09/24/2017  . Benign paroxysmal positional vertigo   . History of stroke   . Hyperlipemia 08/23/2017  . Encounter for therapeutic drug monitoring 08/08/2017  . Long term (current) use of anticoagulants [Z79.01] 07/12/2017  . Hemiparesis and alteration of sensations as late effects of stroke ([place]) 09/07/2016  . Abnormality of gait 09/07/2016  . HTN (hypertension) 04/13/2016  . H/O mitral valve replacement with mechanical valve 10/16/2015  . Diabetes mellitus without complication ([place])   . Hemiparesis (L sided - mild) due to old stroke [facility])     Janene Harvey, PT, DPT 08/16/18 12:00 PM   [facility] [address]  [place], [place], [address] Phone: [phone]   Fax:  [phone]  Name: NAYAH LUKENS MRN: [id number] Date of Birth: October 20, 1946

## 2018-08-18 ENCOUNTER — Encounter: Payer: Self-pay | Admitting: Physical Therapy

## 2018-08-18 ENCOUNTER — Ambulatory Visit: Payer: Medicare Other | Admitting: Physical Therapy

## 2018-08-18 DIAGNOSIS — R261 Paralytic gait: Secondary | ICD-10-CM

## 2018-08-18 DIAGNOSIS — M6281 Muscle weakness (generalized): Secondary | ICD-10-CM | POA: Diagnosis not present

## 2018-08-18 DIAGNOSIS — R2681 Unsteadiness on feet: Secondary | ICD-10-CM

## 2018-08-18 NOTE — Therapy (Signed)
[facility] [address]  [place], [place], [address] Phone: [phone]   Fax:  [phone]  Physical Therapy Treatment  Patient Details  Name: [name] MRN: [id number] Date of Birth: 07-11-1946 Referring Provider (PT): Domenick Gong, MD   Encounter Date: 08/18/2018  PT End of Session - 08/18/18 1155    Visit Number  5    Number of Visits  17    Date for PT Re-Evaluation  09/25/18    Authorization Type  Medicare    PT Start Time  0845    PT Stop Time  0929    PT Time Calculation (min)  44 min    Activity Tolerance  Patient tolerated treatment well    Behavior During Therapy  [facility] for tasks assessed/performed       Past Medical History:  Diagnosis Date  . Abnormality of gait 09/07/2016  . Allergy   . Diabetes mellitus without complication [facility])    Patient denies this - notes history of glucose intolerance  . Hemiparesis and alteration of sensations as late effects of stroke ([place]) 72/31/2017  . History of pneumonia 72  . S/P MVR (mitral valve replacement)    Mechanical mitral valve replacement at age 72 (done in [place])  // echo 7/17: EF 55-60, normal wall motion, bileaflet mechanical mitral valve prosthesis functioning normally, mild LAE, mildly reduced RVSF, small pericardial effusion  . Stroke [facility]) 72, 2013, 2015    Past Surgical History:  Procedure Laterality Date  . ABDOMINAL HYSTERECTOMY    . BRAIN SURGERY    . BUNIONECTOMY  72  . CARDIAC VALVE REPLACEMENT  72  . TOE SURGERY  72    There were no vitals filed for this visit.  Subjective Assessment - 08/18/18 0848    Subjective  Reports L thigh pain was bothering her yesterday    Diagnostic tests  none recent    Patient Stated Goals  getting more confidence with walking     Currently in Pain?  Yes    Pain Score  3     Pain Location  Knee    Pain Orientation  Right    Pain Descriptors / Indicators  Tightness    Pain Type   Chronic pain                       [facility] Adult PT Treatment/Exercise - 08/18/18 0001      Exercises   Exercises  Knee/Hip      Knee/Hip Exercises: Aerobic   Nustep  Lvl 3, 6 min    L foot wrapped     Knee/Hip Exercises: Standing   Forward Step Up  Right;Left;1 set;Hand Hold: 0;Step Height: 4";Limitations;10 reps    Forward Step Up Limitations  with SPC; edu and practice sequencing SPC    Step Down  Right;Left;1 set;Hand Hold: 0;Step Height: 4";10 reps   R & L foot first with SPC   Other Standing Knee Exercises  L sided weight shift 15x3" with mirror    Other Standing Knee Exercises  L weight shift + R foot step up on 4" step with mirror 10x   1 episode of LOB with CGA/min A to recover     Knee/Hip Exercises: Seated   Sit to Sand  5 reps;without UE support   manual assistance at L knee to avoiud valgus            PT Education - 08/18/18 1151  Education Details  update to HEP; heavy instruction with written handout on LE and cane sequencing with ascending/descending stairs    Person(s) Educated  Patient    Methods  Explanation;Demonstration;Tactile cues;Verbal cues;Handout    Comprehension  Verbalized understanding;Returned demonstration       PT Short Term Goals - 08/18/18 1214      PT SHORT TERM GOAL #1   Title  Patient to be independent with initial HEP.    Time  4    Period  Weeks    Status  Achieved        PT Long Term Goals - 08/02/18 1147      PT LONG TERM GOAL #1   Title  Patient to be independent with advanced HEP.    Time  8    Period  Weeks    Status  On-going      PT LONG TERM GOAL #2   Title  Patient to demonstrate 4+/5 strength in B hips to encourage proper gait pattern.    Time  8    Period  Weeks    Status  On-going      PT LONG TERM GOAL #3   Title  Patient to score <15 sec on 5xSTS without UE support to decrease risk of falls.    Time  8    Period  Weeks    Status  On-going      PT LONG TERM GOAL #4   Title   Patient to score <14 sec on TUG with LRAD to decrease risk of falls.     Time  8    Period  Weeks    Status  On-going      PT LONG TERM GOAL #5   Title  Patient to report 75% increased confidence in ability to ambulate on slick level surfaces with LRAD.    Time  8    Period  Weeks    Status  On-going            Plan - 08/18/18 1155    Clinical Impression Statement  Focused today's session on simulation of curb navigation as patient reporting instability and fear with this activity. Patient with much better performance of L weight shift and R foot step onto small step- 1 episode of LOB with CGA/min A to recover. Spoke with patient on techniques improve balance recovery. Large focus on education and practice with ascending/descending step with use of SPC. Patient with some difficulty ascending, descending slow speed/hesitancy and anterior trunk lean and decreased control on descending step. Better performance of STS today- patient with improved speed and decreased hesitancy. Advised patient to continue step up/down with SPC and handrail at home and to perform with husband's supervision. Patient reported understanding.     PT Treatment/Interventions  ADLs/Self Care Home Management;Cryotherapy;Electrical Stimulation;Moist Heat;Ultrasound;DME Instruction;Gait training;Stair training;Functional mobility training;Therapeutic activities;Therapeutic exercise;Manual techniques;Orthotic Fit/Training;Patient/family education;Neuromuscular re-education;Balance training;Passive range of motion;Dry needling;Energy conservation;Splinting;Taping    Consulted and Agree with Plan of Care  Patient       Patient will benefit from skilled therapeutic intervention in order to improve the following deficits and impairments:  Abnormal gait, Decreased endurance, Decreased activity tolerance, Decreased strength, Pain, Decreased balance, Decreased mobility, Difficulty walking, Postural dysfunction, Impaired  flexibility  Visit Diagnosis: Muscle weakness (generalized)  Paralytic gait  Unsteadiness on feet     Problem List Patient Active Problem List   Diagnosis Date Noted  . Vertigo 09/24/2017  . Benign paroxysmal positional vertigo   . History of  stroke   . Hyperlipemia 08/23/2017  . Encounter for therapeutic drug monitoring 08/08/2017  . Long term (current) use of anticoagulants [Z79.01] 07/12/2017  . Hemiparesis and alteration of sensations as late effects of stroke ([place]) 09/07/2016  . Abnormality of gait 09/07/2016  . HTN (hypertension) 04/13/2016  . H/O mitral valve replacement with mechanical valve 10/16/2015  . Diabetes mellitus without complication ([place])   . 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  [place], [place], [address] Phone: [phone]   Fax:  [phone]  Name: AYLEEN MCKINSTRY MRN: [id number] Date of Birth: [birth year]

## 2018-08-21 ENCOUNTER — Ambulatory Visit (INDEPENDENT_AMBULATORY_CARE_PROVIDER_SITE_OTHER): Payer: Medicare Other | Admitting: Pharmacist

## 2018-08-21 ENCOUNTER — Ambulatory Visit: Payer: Medicare Other | Admitting: Physical Therapy

## 2018-08-21 ENCOUNTER — Encounter: Payer: Self-pay | Admitting: Physical Therapy

## 2018-08-21 DIAGNOSIS — Z5181 Encounter for therapeutic drug level monitoring: Secondary | ICD-10-CM

## 2018-08-21 DIAGNOSIS — Z7901 Long term (current) use of anticoagulants: Secondary | ICD-10-CM | POA: Diagnosis not present

## 2018-08-21 DIAGNOSIS — R261 Paralytic gait: Secondary | ICD-10-CM | POA: Diagnosis not present

## 2018-08-21 DIAGNOSIS — M6281 Muscle weakness (generalized): Secondary | ICD-10-CM | POA: Diagnosis not present

## 2018-08-21 DIAGNOSIS — I69359 Hemiplegia and hemiparesis following cerebral infarction affecting unspecified side: Secondary | ICD-10-CM | POA: Diagnosis not present

## 2018-08-21 DIAGNOSIS — R2681 Unsteadiness on feet: Secondary | ICD-10-CM

## 2018-08-21 DIAGNOSIS — Z952 Presence of prosthetic heart valve: Secondary | ICD-10-CM | POA: Diagnosis not present

## 2018-08-21 LAB — POCT INR: INR: 3.9 — AB (ref 2.0–3.0)

## 2018-08-21 NOTE — Therapy (Signed)
[facility] [address]  [place], [place], [address] Phone: [phone]   Fax:  [phone]  Physical Therapy Treatment  Patient Details  Name: [name] MRN: [id number] Date of Birth: 1946/05/14 Referring Provider (PT): Domenick Gong, MD   Encounter Date: 08/21/2018  PT End of Session - 08/21/18 1354    Visit Number  6    Number of Visits  17    Date for PT Re-Evaluation  09/25/18    Authorization Type  Medicare    PT Start Time  3154    PT Stop Time  1354    PT Time Calculation (min)  49 min    Activity Tolerance  Patient tolerated treatment well    Behavior During Therapy  [facility] for tasks assessed/performed       Past Medical History:  Diagnosis Date  . Abnormality of gait 09/07/2016  . Allergy   . Diabetes mellitus without complication [facility])    Patient denies this - notes history of glucose intolerance  . Hemiparesis and alteration of sensations as late effects of stroke ([place]) 09/07/2016  . History of pneumonia 1997  . S/P MVR (mitral valve replacement)    Mechanical mitral valve replacement at age 41 (done in [place])  // echo 7/17: EF 55-60, normal wall motion, bileaflet mechanical mitral valve prosthesis functioning normally, mild LAE, mildly reduced RVSF, small pericardial effusion  . Stroke [facility]) 1997, 2013, 2015    Past Surgical History:  Procedure Laterality Date  . ABDOMINAL HYSTERECTOMY    . BRAIN SURGERY    . BUNIONECTOMY  1993  . CARDIAC VALVE REPLACEMENT  1997  . TOE SURGERY  1996    There were no vitals filed for this visit.  Subjective Assessment - 08/21/18 1303    Subjective  Patient brining in photos of her curb that she was having trouble nagivating at home. Reports compliance with HEP and has been working on steps. Took a tylenol before session.     Diagnostic tests  none recent    Patient Stated Goals  getting more confidence with walking     Currently in Pain?   No/denies                       [facility] Adult PT Treatment/Exercise - 08/21/18 0001      Exercises   Exercises  Knee/Hip      Knee/Hip Exercises: Aerobic   Nustep  Lvl 3, 7 min    L foot wrapped into step     Knee/Hip Exercises: Standing   Forward Step Up  Right;Left;1 set;Hand Hold: 0;Step Height: 4";Limitations;10 reps    Forward Step Up Limitations  with SPC; edu and practice sequencing SPC    Step Down  Right;Left;1 set;Hand Hold: 0;Step Height: 4";10 reps    Gait Training  anterior step up + step down from foam pad with SPC x 10 with CGA   cues for SPC/foot sequencing   Other Standing Knee Exercises  M/L weight shift on foam with SPC; 10x each    Other Standing Knee Exercises  ant/pos weight shift on foam with SPC; 10x each      Knee/Hip Exercises: Seated   Sit to Sand  5 reps;without UE support   with manual resistance at L knee to avoid vaglus     Knee/Hip Exercises: Supine   Bridges  Strengthening;Both;2 sets;5 reps;Limitations    Bridges Limitations  manual assistance to keep  L foot down; cues to avoid L valgus               PT Short Term Goals - 08/18/18 1214      PT SHORT TERM GOAL #1   Title  Patient to be independent with initial HEP.    Time  4    Period  Weeks    Status  Achieved        PT Long Term Goals - 08/02/18 1147      PT LONG TERM GOAL #1   Title  Patient to be independent with advanced HEP.    Time  8    Period  Weeks    Status  On-going      PT LONG TERM GOAL #2   Title  Patient to demonstrate 4+/5 strength in B hips to encourage proper gait pattern.    Time  8    Period  Weeks    Status  On-going      PT LONG TERM GOAL #3   Title  Patient to score <15 sec on 5xSTS without UE support to decrease risk of falls.    Time  8    Period  Weeks    Status  On-going      PT LONG TERM GOAL #4   Title  Patient to score <14 sec on TUG with LRAD to decrease risk of falls.     Time  8    Period  Weeks    Status   On-going      PT LONG TERM GOAL #5   Title  Patient to report 75% increased confidence in ability to ambulate on slick level surfaces with LRAD.    Time  8    Period  Weeks    Status  On-going            Plan - 08/21/18 1355    Clinical Impression Statement  Patient reports compliance with HEP. Worked on STS transfers with manual cues to avoid valgus collapse at L LE. Introduced bridges with focus on hip stability with good tolerance. Patient requiring visual feedback d/t decreased proprioceptive sense in L LE. Introduced step up/step downs on foam pad with SPC. Cues required to assist with cane and foot sequencing. Intermittent CGA/min A required for balance. Patient requiring sitting break at end of session d/t "swimmy" feeling in head but dull resolution of symptoms when leaving session.     PT Treatment/Interventions  ADLs/Self Care Home Management;Cryotherapy;Electrical Stimulation;Moist Heat;Ultrasound;DME Instruction;Gait training;Stair training;Functional mobility training;Therapeutic activities;Therapeutic exercise;Manual techniques;Orthotic Fit/Training;Patient/family education;Neuromuscular re-education;Balance training;Passive range of motion;Dry needling;Energy conservation;Splinting;Taping    Consulted and Agree with Plan of Care  Patient       Patient will benefit from skilled therapeutic intervention in order to improve the following deficits and impairments:  Abnormal gait, Decreased endurance, Decreased activity tolerance, Decreased strength, Pain, Decreased balance, Decreased mobility, Difficulty walking, Postural dysfunction, Impaired flexibility  Visit Diagnosis: Muscle weakness (generalized)  Paralytic gait  Unsteadiness on feet     Problem List Patient Active Problem List   Diagnosis Date Noted  . Vertigo 09/24/2017  . Benign paroxysmal positional vertigo   . History of stroke   . Hyperlipemia 08/23/2017  . Encounter for therapeutic drug monitoring  08/08/2017  . Long term (current) use of anticoagulants [Z79.01] 07/12/2017  . Hemiparesis and alteration of sensations as late effects of stroke ([place]) 09/07/2016  . Abnormality of gait 09/07/2016  . HTN (hypertension) 04/13/2016  . H/O mitral valve replacement  with mechanical valve 10/16/2015  . Diabetes mellitus without complication ([place])   . 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  [place], [place], [address] Phone: [phone]   Fax:  [phone]  Name: [name] MRN: [id number] Date of Birth: February 07, 1946

## 2018-08-23 ENCOUNTER — Ambulatory Visit: Payer: Medicare Other

## 2018-08-23 DIAGNOSIS — M6281 Muscle weakness (generalized): Secondary | ICD-10-CM

## 2018-08-23 DIAGNOSIS — R2681 Unsteadiness on feet: Secondary | ICD-10-CM | POA: Diagnosis not present

## 2018-08-23 DIAGNOSIS — R261 Paralytic gait: Secondary | ICD-10-CM

## 2018-08-23 NOTE — Therapy (Signed)
[facility] [address]  [place], [place], [address] Phone: [phone]   Fax:  [phone]  Physical Therapy Treatment  Patient Details  Name: [name] MRN: [id number] Date of Birth: 1946/08/13 Referring Provider (PT): Domenick Gong, MD   Encounter Date: 08/23/2018  PT End of Session - 08/23/18 1100    Visit Number  7    Number of Visits  17    Date for PT Re-Evaluation  09/25/18    Authorization Type  Medicare    PT Start Time  1058    PT Stop Time  1141    PT Time Calculation (min)  43 min    Activity Tolerance  Patient tolerated treatment well    Behavior During Therapy  [facility] for tasks assessed/performed       Past Medical History:  Diagnosis Date  . Abnormality of gait 09/07/2016  . Allergy   . Diabetes mellitus without complication [facility])    Patient denies this - notes history of glucose intolerance  . Hemiparesis and alteration of sensations as late effects of stroke ([place]) 09/07/2016  . History of pneumonia 1997  . S/P MVR (mitral valve replacement)    Mechanical mitral valve replacement at age 72 (done in [place])  // echo 7/17: EF 55-60, normal wall motion, bileaflet mechanical mitral valve prosthesis functioning normally, mild LAE, mildly reduced RVSF, small pericardial effusion  . Stroke [facility]) 1997, 2013, 2015    Past Surgical History:  Procedure Laterality Date  . ABDOMINAL HYSTERECTOMY    . BRAIN SURGERY    . BUNIONECTOMY  1993  . CARDIAC VALVE REPLACEMENT  1997  . TOE SURGERY  1996    There were no vitals filed for this visit.  Subjective Assessment - 08/23/18 1100    Subjective  Pt. no new complaints.      Diagnostic tests  none recent    Patient Stated Goals  getting more confidence with walking     Currently in Pain?  No/denies    Pain Score  0-No pain    Multiple Pain Sites  No                       [facility] Adult PT Treatment/Exercise - 08/23/18 1135       Knee/Hip Exercises: Standing   Forward Step Up  Right;Left;Step Height: 6";Hand Hold: 2;10 reps    Forward Step Up Limitations  Heavy     Other Standing Knee Exercises  M/L weight shift on foam with SPC; 15x each    Other Standing Knee Exercises  ant/pos weight shift on foam with SPC; 15 x each      Knee/Hip Exercises: Seated   Sit to Sand  10 reps;without UE support   with elevated table               PT Short Term Goals - 08/18/18 1214      PT SHORT TERM GOAL #1   Title  Patient to be independent with initial HEP.    Time  4    Period  Weeks    Status  Achieved        PT Long Term Goals - 08/02/18 1147      PT LONG TERM GOAL #1   Title  Patient to be independent with advanced HEP.    Time  8    Period  Weeks    Status  On-going  PT LONG TERM GOAL #2   Title  Patient to demonstrate 4+/5 strength in B hips to encourage proper gait pattern.    Time  8    Period  Weeks    Status  On-going      PT LONG TERM GOAL #3   Title  Patient to score <15 sec on 5xSTS without UE support to decrease risk of falls.    Time  8    Period  Weeks    Status  On-going      PT LONG TERM GOAL #4   Title  Patient to score <14 sec on TUG with LRAD to decrease risk of falls.     Time  8    Period  Weeks    Status  On-going      PT LONG TERM GOAL #5   Title  Patient to report 75% increased confidence in ability to ambulate on slick level surfaces with LRAD.    Time  8    Period  Weeks    Status  On-going            Plan - 08/23/18 1101    Clinical Impression Statement  Pt. doing well today with no new complaints.  Tolerated progression of step height with forward step ups to 6" to simulate curb navigation without LOB however did required CGA/supervision from therapist for safety.  Focused on L LE strengthening per pt. tolerance and advancement of compliant surface dynamic balance activities with pt. tolerating well.  Will plan to practice navigating curbs outside in  future visits per weather.      PT Treatment/Interventions  ADLs/Self Care Home Management;Cryotherapy;Electrical Stimulation;Moist Heat;Ultrasound;DME Instruction;Gait training;Stair training;Functional mobility training;Therapeutic activities;Therapeutic exercise;Manual techniques;Orthotic Fit/Training;Patient/family education;Neuromuscular re-education;Balance training;Passive range of motion;Dry needling;Energy conservation;Splinting;Taping    Consulted and Agree with Plan of Care  Patient       Patient will benefit from skilled therapeutic intervention in order to improve the following deficits and impairments:  Abnormal gait, Decreased endurance, Decreased activity tolerance, Decreased strength, Pain, Decreased balance, Decreased mobility, Difficulty walking, Postural dysfunction, Impaired flexibility  Visit Diagnosis: Muscle weakness (generalized)  Paralytic gait  Unsteadiness on feet     Problem List Patient Active Problem List   Diagnosis Date Noted  . Vertigo 09/24/2017  . Benign paroxysmal positional vertigo   . History of stroke   . Hyperlipemia 08/23/2017  . Encounter for therapeutic drug monitoring 08/08/2017  . Long term (current) use of anticoagulants [Z79.01] 07/12/2017  . Hemiparesis and alteration of sensations as late effects of stroke ([place]) 09/07/2016  . Abnormality of gait 09/07/2016  . HTN (hypertension) 04/13/2016  . H/O mitral valve replacement with mechanical valve 10/16/2015  . Diabetes mellitus without complication ([place])   . 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  [place], [place], [address] Phone: [phone]   Fax:  [phone]  Name: [name] MRN: [id number] Date of Birth: 04/13/1946

## 2018-08-28 ENCOUNTER — Encounter: Payer: Self-pay | Admitting: Physical Therapy

## 2018-08-28 ENCOUNTER — Ambulatory Visit: Payer: Medicare Other | Admitting: Physical Therapy

## 2018-08-28 ENCOUNTER — Ambulatory Visit (INDEPENDENT_AMBULATORY_CARE_PROVIDER_SITE_OTHER): Payer: Medicare Other | Admitting: Internal Medicine

## 2018-08-28 DIAGNOSIS — Z952 Presence of prosthetic heart valve: Secondary | ICD-10-CM | POA: Diagnosis not present

## 2018-08-28 DIAGNOSIS — Z5181 Encounter for therapeutic drug level monitoring: Secondary | ICD-10-CM

## 2018-08-28 DIAGNOSIS — R261 Paralytic gait: Secondary | ICD-10-CM

## 2018-08-28 DIAGNOSIS — Z7901 Long term (current) use of anticoagulants: Secondary | ICD-10-CM

## 2018-08-28 DIAGNOSIS — M6281 Muscle weakness (generalized): Secondary | ICD-10-CM | POA: Diagnosis not present

## 2018-08-28 DIAGNOSIS — R2681 Unsteadiness on feet: Secondary | ICD-10-CM | POA: Diagnosis not present

## 2018-08-28 DIAGNOSIS — I69359 Hemiplegia and hemiparesis following cerebral infarction affecting unspecified side: Secondary | ICD-10-CM | POA: Diagnosis not present

## 2018-08-28 LAB — POCT INR: INR: 3.2 — AB (ref 2.0–3.0)

## 2018-08-28 NOTE — Therapy (Signed)
[facility] [address]  [place], [place], [address] Phone: [phone]   Fax:  [phone]  Physical Therapy Treatment  Patient Details  Name: [name] MRN: [id number] Date of Birth: 09-May-1946 Referring Provider (PT): Domenick Gong, MD   Encounter Date: 08/28/2018  PT End of Session - 08/28/18 1351    Visit Number  8    Number of Visits  17    Date for PT Re-Evaluation  09/25/18    Authorization Type  Medicare    PT Start Time  6962    PT Stop Time  9528    PT Time Calculation (min)  42 min    Activity Tolerance  Patient tolerated treatment well    Behavior During Therapy  [facility] for tasks assessed/performed       Past Medical History:  Diagnosis Date  . Abnormality of gait 09/07/2016  . Allergy   . Diabetes mellitus without complication [facility])    Patient denies this - notes history of glucose intolerance  . Hemiparesis and alteration of sensations as late effects of stroke ([place]) 09/07/2016  . History of pneumonia 1997  . S/P MVR (mitral valve replacement)    Mechanical mitral valve replacement at age 72 (done in [place])  // echo 7/17: EF 55-60, normal wall motion, bileaflet mechanical mitral valve prosthesis functioning normally, mild LAE, mildly reduced RVSF, small pericardial effusion  . Stroke [facility]) 1997, 2013, 2015    Past Surgical History:  Procedure Laterality Date  . ABDOMINAL HYSTERECTOMY    . BRAIN SURGERY    . BUNIONECTOMY  1993  . CARDIAC VALVE REPLACEMENT  1997  . TOE SURGERY  1996    There were no vitals filed for this visit.  Subjective Assessment - 08/28/18 1303    Subjective  Reports she did a lot of standing on Saturday which caused some L leg pain and LBP. Most of this pain has gone away now.     Diagnostic tests  none recent    Patient Stated Goals  getting more confidence with walking     Currently in Pain?  Yes    Pain Score  1     Pain Location  Leg   anterior  thigh   Pain Orientation  Left;Anterior    Pain Descriptors / Indicators  Tightness    Pain Type  Chronic pain                       [facility] Adult PT Treatment/Exercise - 08/28/18 0001      Ambulation/Gait   Ambulation/Gait  Yes    Ambulation/Gait Assistance  4: Min guard    Ambulation Distance (Feet)  400 Feet    Assistive device  Straight cane    Gait Pattern  Step-to pattern;Decreased stance time - left;Decreased hip/knee flexion - left;Decreased weight shift to left;Left circumduction;Antalgic;Poor foot clearance - left    Ambulation Surface  Level;Unlevel;Indoor;Outdoor;Gravel;Paved    Gait velocity  severely decreased    Curb  4: Min assist   edu and instruciton on SPC/foot sequencing up/down curb   Gait Comments  gait training on carpet, paved surface, grass with SPC and CGA with cues to increase L step length and knee extension; gait through obstacle course stepping over/around obstacles       Exercises   Exercises  Knee/Hip      Knee/Hip Exercises: Standing   Knee Flexion  Strengthening;Right;Left;1 set;5 sets;Limitations  Knee Flexion Limitations  standing HS curls with hand and 1 HHA   with edu and discussion on technique/form     Knee/Hip Exercises: Supine   Bridges  Strengthening;Both;Limitations;1 set;10 reps    Bridges Limitations  manual assistance to keep L foot down; cues to avoid L valgus   better ability to control L LE today   Other Supine Knee/Hip Exercises  supine clam with red TB around knees and manual assistance to keep feet together x10                PT Short Term Goals - 08/18/18 1214      PT SHORT TERM GOAL #1   Title  Patient to be independent with initial HEP.    Time  4    Period  Weeks    Status  Achieved        PT Long Term Goals - 08/02/18 1147      PT LONG TERM GOAL #1   Title  Patient to be independent with advanced HEP.    Time  8    Period  Weeks    Status  On-going      PT LONG TERM GOAL #2    Title  Patient to demonstrate 4+/5 strength in B hips to encourage proper gait pattern.    Time  8    Period  Weeks    Status  On-going      PT LONG TERM GOAL #3   Title  Patient to score <15 sec on 5xSTS without UE support to decrease risk of falls.    Time  8    Period  Weeks    Status  On-going      PT LONG TERM GOAL #4   Title  Patient to score <14 sec on TUG with LRAD to decrease risk of falls.     Time  8    Period  Weeks    Status  On-going      PT LONG TERM GOAL #5   Title  Patient to report 75% increased confidence in ability to ambulate on slick level surfaces with LRAD.    Time  8    Period  Weeks    Status  On-going            Plan - 08/28/18 1351    Clinical Impression Statement  Patient with no new complaints today. Worked on gait training outside on level and unlevel surfaces today with SPC and CGA. Patient apprehensive but with good stability walking through grass. Required consistent VC's and demonstration for [facility] and foot sequencing when stepping up/down curbs, however with good understanding by end of session. Worked on gait training through obstacle course over and around obstacles with cues to increase L step length and promote full terminal extension. Patient mentioning difficulty with standing HS curls at home- advised patient to avoid alternating legs at this causes her to be "swimmy headed." Patient reported understanding and with no complaints at end of session.     PT Treatment/Interventions  ADLs/Self Care Home Management;Cryotherapy;Electrical Stimulation;Moist Heat;Ultrasound;DME Instruction;Gait training;Stair training;Functional mobility training;Therapeutic activities;Therapeutic exercise;Manual techniques;Orthotic Fit/Training;Patient/family education;Neuromuscular re-education;Balance training;Passive range of motion;Dry needling;Energy conservation;Splinting;Taping    Consulted and Agree with Plan of Care  Patient       Patient will benefit  from skilled therapeutic intervention in order to improve the following deficits and impairments:  Abnormal gait, Decreased endurance, Decreased activity tolerance, Decreased strength, Pain, Decreased balance, Decreased mobility, Difficulty walking, Postural dysfunction,  Impaired flexibility  Visit Diagnosis: Muscle weakness (generalized)  Paralytic gait  Unsteadiness on feet     Problem List Patient Active Problem List   Diagnosis Date Noted  . Vertigo 09/24/2017  . Benign paroxysmal positional vertigo   . History of stroke   . Hyperlipemia 08/23/2017  . Encounter for therapeutic drug monitoring 08/08/2017  . Long term (current) use of anticoagulants [Z79.01] 07/12/2017  . Hemiparesis and alteration of sensations as late effects of stroke ([place]) 09/07/2016  . Abnormality of gait 09/07/2016  . HTN (hypertension) 04/13/2016  . H/O mitral valve replacement with mechanical valve 10/16/2015  . Diabetes mellitus without complication ([place])   . 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  [place], [place], [address] Phone: [phone]   Fax:  [phone]  Name: MACY POLIO MRN: [id number] Date of Birth: [birth year]

## 2018-08-30 ENCOUNTER — Ambulatory Visit: Payer: Medicare Other | Admitting: Physical Therapy

## 2018-08-30 ENCOUNTER — Encounter: Payer: Self-pay | Admitting: Physical Therapy

## 2018-08-30 DIAGNOSIS — M6281 Muscle weakness (generalized): Secondary | ICD-10-CM | POA: Diagnosis not present

## 2018-08-30 DIAGNOSIS — R2681 Unsteadiness on feet: Secondary | ICD-10-CM

## 2018-08-30 DIAGNOSIS — R261 Paralytic gait: Secondary | ICD-10-CM | POA: Diagnosis not present

## 2018-08-30 NOTE — Therapy (Signed)
[facility] [address]  [place], [place], [address] Phone: [phone]   Fax:  [phone]  Physical Therapy Progress Note  Patient Details  Name: [name] MRN: [id number] Date of Birth: [birth year] Referring Provider (PT): Domenick Gong, MD  Progress Note Reporting Period 07/31/18 to 08/30/18  See note below for Objective Data and Assessment of Progress/Goals.    Encounter Date: 08/30/2018  PT End of Session - 08/30/18 1150    Visit Number  9    Number of Visits  17    Date for PT Re-Evaluation  09/25/18    Authorization Type  Medicare    PT Start Time  1056    PT Stop Time  1146    PT Time Calculation (min)  50 min    Activity Tolerance  Patient tolerated treatment well    Behavior During Therapy  WFL for tasks assessed/performed       Past Medical History:  Diagnosis Date  . Abnormality of gait 09/07/2016  . Allergy   . Diabetes mellitus without complication [facility])    Patient denies this - notes history of glucose intolerance  . Hemiparesis and alteration of sensations as late effects of stroke ([place]) 09/07/2016  . History of pneumonia 1997  . S/P MVR (mitral valve replacement)    Mechanical mitral valve replacement at age 72 (done in [place])  // echo 7/17: EF 55-60, normal wall motion, bileaflet mechanical mitral valve prosthesis functioning normally, mild LAE, mildly reduced RVSF, small pericardial effusion  . Stroke [facility]) 1997, 2013, 2015    Past Surgical History:  Procedure Laterality Date  . ABDOMINAL HYSTERECTOMY    . BRAIN SURGERY    . BUNIONECTOMY  1993  . CARDIAC VALVE REPLACEMENT  1997  . TOE SURGERY  1996    There were no vitals filed for this visit.  Subjective Assessment - 08/30/18 1059    Subjective  Reports exercises have been going okay. Reports she is going much better with walking over curbs, getting out and walking rather than avoiding walking d/t fear. Still having  significant fear with walking up stairs in an open space, however coming down was fine.     Diagnostic tests  none recent    Patient Stated Goals  getting more confidence with walking     Currently in Pain?  No/denies         [facility] PT Assessment - 08/30/18 0001      Strength   Strength Assessment Site  Hip;Knee;Ankle    Right/Left Hip  Right;Left    Right Hip Flexion  4+/5    Right Hip ABduction  4+/5    Right Hip ADduction  4/5    Left Hip Flexion  4/5    Left Hip ABduction  4/5    Left Hip ADduction  4/5    Right/Left Knee  Right;Left    Right Knee Flexion  5/5    Right Knee Extension  5/5    Left Knee Flexion  4/5    Left Knee Extension  4+/5    Right/Left Ankle  Right;Left    Right Ankle Dorsiflexion  4+/5    Right Ankle Plantar Flexion  4+/5      Standardized Balance Assessment   Standardized Balance Assessment  Timed Up and Go Test;Five Times Sit to Stand    Five times sit to stand comments   24.9      Timed Up and Go Test  TUG  Normal TUG    Normal TUG (seconds)  27.5   with chair with armrests and SPC                  [facility] Adult PT Treatment/Exercise - 08/30/18 0001      Ambulation/Gait   Ambulation/Gait  Yes    Ambulation/Gait Assistance  4: Min guard    Ambulation Distance (Feet)  120 Feet    Assistive device  Straight cane    Gait Pattern  Decreased stance time - left;Decreased hip/knee flexion - left;Decreased weight shift to left;Left circumduction;Antalgic;Poor foot clearance - left;Step-through pattern    Gait Comments  gait training with turning- patient with more difficulty with speed and foot clearance when turning R      Exercises   Exercises  Knee/Hip      Knee/Hip Exercises: Aerobic   Nustep  Lvl 3, 6 min    L foot wrapped in step     Knee/Hip Exercises: Standing   Terminal Knee Extension  Strengthening;Left;1 set;15 reps;Theraband;Limitations    Theraband Level (Terminal Knee Extension)  Level 3 (Green)    Terminal Knee  Extension Limitations  with chair support; c/o "swimminess" in head which dissipated after several reps      Knee/Hip Exercises: Seated   Abduction/Adduction   Strengthening;Both;1 set;10 reps;Limitations    Abd/Adduction Limitations  sitting adduction bal squeeze 10x10" with ball      Knee/Hip Exercises: Supine   Bridges with Ball Squeeze  Strengthening;Both;1 set;10 reps   cues for foot placement and glute contraction            PT Education - 08/30/18 1150    Education Details  update to HEP    Person(s) Educated  Patient    Methods  Explanation;Demonstration;Tactile cues;Verbal cues;Handout    Comprehension  Verbalized understanding;Returned demonstration       PT Short Term Goals - 08/30/18 1108      PT SHORT TERM GOAL #1   Title  Patient to be independent with initial HEP.    Time  4    Period  Weeks    Status  Achieved        PT Long Term Goals - 08/30/18 1108      PT LONG TERM GOAL #1   Title  Patient to be independent with advanced HEP.    Time  8    Period  Weeks    Status  On-going   met for current     PT LONG TERM GOAL #2   Title  Patient to demonstrate 4+/5 strength in B hips to encourage proper gait pattern.    Time  8    Period  Weeks    Status  Partially Met   improvement noted in B hip flexion, B hip abduction, R knee flexion and extension     PT LONG TERM GOAL #3   Title  Patient to score <15 sec on 5xSTS without UE support to decrease risk of falls.    Time  8    Period  Weeks    Status  On-going   scored 24.9 sec today; much improved since last measured     PT LONG TERM GOAL #4   Title  Patient to score <14 sec on TUG with LRAD to decrease risk of falls.     Time  8    Period  Weeks    Status  On-going   scored 27.5 sec on TUG with SPC; much  improved since last measured     PT LONG TERM GOAL #5   Title  Patient to report 75% increased confidence in ability to ambulate on slick level surfaces with LRAD.    Time  8    Period   Weeks    Status  On-going   reports 25-30% improvement in confidence           Plan - 08/30/18 1154    Clinical Impression Statement  Patient arrived to session with no new complaints. Reports she has noticed improvements in her ability to walk over curbs and has been able to get out and about more d/t decreased fear with walking since starting PT. Patient reporting 25-30% improvement in confidence with walking on slick surfaces with SPC. Still notes significant fear when walking up stairs "in an open space;" patient noting agoraphobia-type fear. Patient with improvement noted in B hip flexion, B hip abduction, R knee flexion and extension with strength testing. Patient also showed great improvement on TUG and 5xSTS testing today; still shows room for progress to score within low fall risk categories. Worked on gait training with turning- patient with more difficulty with speed and foot clearance when turning to R side. Addressed hip adduction weakness with adduction isometrics and bridge with ball squeeze. Updated HEP to include isometric. Patient reported understanding. Patient has been consistent with HEP and showing great effort with PT thus far. Continues to show improvement with testing and will benefit from continued skilled PT services to address remaining goals.     PT Treatment/Interventions  ADLs/Self Care Home Management;Cryotherapy;Electrical Stimulation;Moist Heat;Ultrasound;DME Instruction;Gait training;Stair training;Functional mobility training;Therapeutic activities;Therapeutic exercise;Manual techniques;Orthotic Fit/Training;Patient/family education;Neuromuscular re-education;Balance training;Passive range of motion;Dry needling;Energy conservation;Splinting;Taping    Consulted and Agree with Plan of Care  Patient       Patient will benefit from skilled therapeutic intervention in order to improve the following deficits and impairments:  Abnormal gait, Decreased endurance,  Decreased activity tolerance, Decreased strength, Pain, Decreased balance, Decreased mobility, Difficulty walking, Postural dysfunction, Impaired flexibility  Visit Diagnosis: Muscle weakness (generalized)  Paralytic gait  Unsteadiness on feet     Problem List Patient Active Problem List   Diagnosis Date Noted  . Vertigo 09/24/2017  . Benign paroxysmal positional vertigo   . History of stroke   . Hyperlipemia 08/23/2017  . Encounter for therapeutic drug monitoring 08/08/2017  . Long term (current) use of anticoagulants [Z79.01] 07/12/2017  . Hemiparesis and alteration of sensations as late effects of stroke ([place]) 09/07/2016  . Abnormality of gait 09/07/2016  . HTN (hypertension) 04/13/2016  . H/O mitral valve replacement with mechanical valve 10/16/2015  . Diabetes mellitus without complication ([place])   . 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  [place], [place], [address] Phone: [phone]   Fax:  [phone]  Name: JANISSA BERTRAM MRN: [id number] Date of Birth: [birth year]

## 2018-09-04 ENCOUNTER — Ambulatory Visit (INDEPENDENT_AMBULATORY_CARE_PROVIDER_SITE_OTHER): Payer: Medicare Other | Admitting: Cardiovascular Disease

## 2018-09-04 ENCOUNTER — Ambulatory Visit: Payer: Medicare Other

## 2018-09-04 DIAGNOSIS — R261 Paralytic gait: Secondary | ICD-10-CM

## 2018-09-04 DIAGNOSIS — I69359 Hemiplegia and hemiparesis following cerebral infarction affecting unspecified side: Secondary | ICD-10-CM

## 2018-09-04 DIAGNOSIS — M6281 Muscle weakness (generalized): Secondary | ICD-10-CM | POA: Diagnosis not present

## 2018-09-04 DIAGNOSIS — Z7901 Long term (current) use of anticoagulants: Secondary | ICD-10-CM

## 2018-09-04 DIAGNOSIS — Z5181 Encounter for therapeutic drug level monitoring: Secondary | ICD-10-CM

## 2018-09-04 DIAGNOSIS — R2681 Unsteadiness on feet: Secondary | ICD-10-CM | POA: Diagnosis not present

## 2018-09-04 DIAGNOSIS — Z952 Presence of prosthetic heart valve: Secondary | ICD-10-CM

## 2018-09-04 LAB — POCT INR: INR: 4 — AB (ref 2.0–3.0)

## 2018-09-04 NOTE — Therapy (Signed)
[facility] [address]  [place], [place], [address] Phone: [phone]   Fax:  [phone]  Physical Therapy Treatment  Patient Details  Name: [name] MRN: [id number] Date of Birth: 21-Jun-1946 Referring Provider (PT): Domenick Gong, MD   Encounter Date: 09/04/2018  PT End of Session - 09/04/18 1142    Visit Number  10    Number of Visits  17    Date for PT Re-Evaluation  09/25/18    Authorization Type  Medicare    PT Start Time  1102    PT Stop Time  1142    PT Time Calculation (min)  40 min    Activity Tolerance  Patient tolerated treatment well    Behavior During Therapy  [facility] for tasks assessed/performed       Past Medical History:  Diagnosis Date  . Abnormality of gait 09/07/2016  . Allergy   . Diabetes mellitus without complication [facility]    Patient denies this - notes history of glucose intolerance  . Hemiparesis and alteration of sensations as late effects of stroke ([place]) 09/07/2016  . History of pneumonia 1997  . S/P MVR (mitral valve replacement)    Mechanical mitral valve replacement at age 45 (done in [place])  // echo 7/17: EF 55-60, normal wall motion, bileaflet mechanical mitral valve prosthesis functioning normally, mild LAE, mildly reduced RVSF, small pericardial effusion  . Stroke [facility]) 1997, 2013, 2015    Past Surgical History:  Procedure Laterality Date  . ABDOMINAL HYSTERECTOMY    . BRAIN SURGERY    . BUNIONECTOMY  1993  . CARDIAC VALVE REPLACEMENT  1997  . TOE SURGERY  1996    There were no vitals filed for this visit.  Subjective Assessment - 09/04/18 1114    Subjective  Pt. noting some back pain in mornings.     Diagnostic tests  none recent    Patient Stated Goals  getting more confidence with walking     Currently in Pain?  No/denies    Pain Score  0-No pain    Multiple Pain Sites  No                       [facility] Adult PT Treatment/Exercise -  09/04/18 1126      Knee/Hip Exercises: Aerobic   Nustep  Lvl 3, 7 min       Knee/Hip Exercises: Standing   Hip Flexion  Right;Left;10 reps;Knee straight;Stengthening    Hip Flexion Limitations  yellow TB; 1 HH support, cane    Hip Abduction  Right;Left;10 reps;Knee straight    Abduction Limitations  yellow TB; 1 HH support; cane    Hip Extension  Right;Left;10 reps;Knee straight    Extension Limitations  yellow TB; 1 HH support; cane      Knee/Hip Exercises: Seated   Abduction/Adduction   Both;1 set;15 reps;Strengthening;Limitations    Abd/Adduction Limitations  sitting adduction ball squeeze     Sit to [place]  10 reps;without UE support   cloth strap around therapist leg and pt. to prevent L hip IR     Knee/Hip Exercises: Supine   Bridges with Ball Squeeze  --   x 12 reps with adduction ball squeeze    Bridges with Clamshell  Both;10 reps   with isometric hip abd/ER into red looped band at knees              PT Short Term Goals -  08/30/18 1108      PT SHORT TERM GOAL #1   Title  Patient to be independent with initial HEP.    Time  4    Period  Weeks    Status  Achieved        PT Long Term Goals - 08/30/18 1108      PT LONG TERM GOAL #1   Title  Patient to be independent with advanced HEP.    Time  8    Period  Weeks    Status  On-going   met for current     PT LONG TERM GOAL #2   Title  Patient to demonstrate 4+/5 strength in B hips to encourage proper gait pattern.    Time  8    Period  Weeks    Status  Partially Met   improvement noted in B hip flexion, B hip abduction, R knee flexion and extension     PT LONG TERM GOAL #3   Title  Patient to score <15 sec on 5xSTS without UE support to decrease risk of falls.    Time  8    Period  Weeks    Status  On-going   scored 24.9 sec today; much improved since last measured     PT LONG TERM GOAL #4   Title  Patient to score <14 sec on TUG with LRAD to decrease risk of falls.     Time  8    Period  Weeks     Status  On-going   scored 27.5 sec on TUG with SPC; much improved since last measured     PT LONG TERM GOAL #5   Title  Patient to report 75% increased confidence in ability to ambulate on slick level surfaces with LRAD.    Time  8    Period  Weeks    Status  On-going   reports 25-30% improvement in confidence           Plan - 09/04/18 1146    Clinical Impression Statement  Marguarite doing well today.  Session focused on proximal hip strengthening and standing balance incorporating SLS activities.  Most difficulty with SLS 3-way hip kickers today however tolerated all activities well in session.  Ended visit with pt. noting some L LE fatigue however pain free.  Will continue to progress.      PT Treatment/Interventions  ADLs/Self Care Home Management;Cryotherapy;Electrical Stimulation;Moist Heat;Ultrasound;DME Instruction;Gait training;Stair training;Functional mobility training;Therapeutic activities;Therapeutic exercise;Manual techniques;Orthotic Fit/Training;Patient/family education;Neuromuscular re-education;Balance training;Passive range of motion;Dry needling;Energy conservation;Splinting;Taping    Consulted and Agree with Plan of Care  Patient       Patient will benefit from skilled therapeutic intervention in order to improve the following deficits and impairments:  Abnormal gait, Decreased endurance, Decreased activity tolerance, Decreased strength, Pain, Decreased balance, Decreased mobility, Difficulty walking, Postural dysfunction, Impaired flexibility  Visit Diagnosis: Muscle weakness (generalized)  Paralytic gait  Unsteadiness on feet     Problem List Patient Active Problem List   Diagnosis Date Noted  . Vertigo 09/24/2017  . Benign paroxysmal positional vertigo   . History of stroke   . Hyperlipemia 08/23/2017  . Encounter for therapeutic drug monitoring 08/08/2017  . Long term (current) use of anticoagulants [Z79.01] 07/12/2017  . Hemiparesis and  alteration of sensations as late effects of stroke ([place]) 09/07/2016  . Abnormality of gait 09/07/2016  . HTN (hypertension) 04/13/2016  . H/O mitral valve replacement with mechanical valve 10/16/2015  . Diabetes mellitus without complication ([place])   .  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  [place], [place], [address] Phone: [phone]   Fax:  [phone]  Name: MYRIKAL MESSMER MRN: [id number] Date of Birth: 19-Aug-1946

## 2018-09-06 ENCOUNTER — Ambulatory Visit: Payer: Medicare Other | Admitting: Physical Therapy

## 2018-09-06 ENCOUNTER — Encounter: Payer: Self-pay | Admitting: Physical Therapy

## 2018-09-06 DIAGNOSIS — R2681 Unsteadiness on feet: Secondary | ICD-10-CM | POA: Diagnosis not present

## 2018-09-06 DIAGNOSIS — R261 Paralytic gait: Secondary | ICD-10-CM

## 2018-09-06 DIAGNOSIS — M6281 Muscle weakness (generalized): Secondary | ICD-10-CM

## 2018-09-06 NOTE — Therapy (Signed)
[facility] [address]  [place], [place], [address] Phone: [phone]   Fax:  [phone]  Physical Therapy Treatment  Patient Details  Name: [name] MRN: [id number] Date of Birth: 1946-07-08 Referring Provider (PT): Domenick Gong, MD   Encounter Date: 09/06/2018  PT End of Session - 09/06/18 1557    Visit Number  11    Number of Visits  17    Date for PT Re-Evaluation  09/25/18    Authorization Type  Medicare    PT Start Time  1310    PT Stop Time  3267    PT Time Calculation (min)  45 min    Activity Tolerance  Patient tolerated treatment well;Patient limited by pain    Behavior During Therapy  [facility] for tasks assessed/performed       Past Medical History:  Diagnosis Date  . Abnormality of gait 09/07/2016  . Allergy   . Diabetes mellitus without complication [facility])    Patient denies this - notes history of glucose intolerance  . Hemiparesis and alteration of sensations as late effects of stroke ([place]) 09/07/2016  . History of pneumonia 1997  . S/P MVR (mitral valve replacement)    Mechanical mitral valve replacement at age 72 (done in [place])  // echo 7/17: EF 55-60, normal wall motion, bileaflet mechanical mitral valve prosthesis functioning normally, mild LAE, mildly reduced RVSF, small pericardial effusion  . Stroke [facility]) 1997, 2013, 2015    Past Surgical History:  Procedure Laterality Date  . ABDOMINAL HYSTERECTOMY    . BRAIN SURGERY    . BUNIONECTOMY  1993  . CARDIAC VALVE REPLACEMENT  1997  . TOE SURGERY  1996    There were no vitals filed for this visit.  Subjective Assessment - 09/06/18 1258    Subjective  Denies recent falls. Reports she has been doing well.     Diagnostic tests  none recent    Patient Stated Goals  getting more confidence with walking     Currently in Pain?  Yes    Pain Score  1     Pain Location  Leg    Pain Orientation  Left;Anterior    Pain Descriptors /  Indicators  Tightness    Pain Type  Chronic pain                       [facility] Adult PT Treatment/Exercise - 09/06/18 0001      Exercises   Exercises  Knee/Hip      Knee/Hip Exercises: Stretches   Other Knee/Hip Stretches  L SKTC 2x30" to tolerance    Other Knee/Hip Stretches  L KTOS 2x30"      Knee/Hip Exercises: Aerobic   Nustep  Lvl 4, 6 min    L foot wrapped in step     Knee/Hip Exercises: Standing   Knee Flexion  Strengthening;Right;Left;1 set;Limitations;15 reps   mild incoordination of movement on L LE   Knee Flexion Limitations  at counter top    Hip Flexion  Stengthening;Both;1 set;20 reps;Knee bent;Limitations    Hip Flexion Limitations  at counter top; cues for weight shift before lifting LE    SLS  L SLS at counter top with heavy manual and verbal cues for L weight shift x 6 min   c/o    Other Standing Knee Exercises  standing L weight shift + toe tap on dynadisc x10       Knee/Hip  Exercises: Supine   Bridges with Diona Foley Squeeze  Strengthening;Both;1 set;10 reps    Other Supine Knee/Hip Exercises  supine resisted hip flexion with red TB around toes 20x       Knee/Hip Exercises: Sidelying   Clams  R side x10 with heavy cues to avoid trunk rotation and to keep ankles together; unable to tolerate L d/t L hip pain             PT Education - 09/06/18 1557    Education Details  update to HEP    Person(s) Educated  Patient    Methods  Explanation;Demonstration;Tactile cues;Verbal cues;Handout    Comprehension  Verbalized understanding;Returned demonstration       PT Short Term Goals - 08/30/18 1108      PT SHORT TERM GOAL #1   Title  Patient to be independent with initial HEP.    Time  4    Period  Weeks    Status  Achieved        PT Long Term Goals - 08/30/18 1108      PT LONG TERM GOAL #1   Title  Patient to be independent with advanced HEP.    Time  8    Period  Weeks    Status  On-going   met for current     PT LONG TERM GOAL  #2   Title  Patient to demonstrate 4+/5 strength in B hips to encourage proper gait pattern.    Time  8    Period  Weeks    Status  Partially Met   improvement noted in B hip flexion, B hip abduction, R knee flexion and extension     PT LONG TERM GOAL #3   Title  Patient to score <15 sec on 5xSTS without UE support to decrease risk of falls.    Time  8    Period  Weeks    Status  On-going   scored 24.9 sec today; much improved since last measured     PT LONG TERM GOAL #4   Title  Patient to score <14 sec on TUG with LRAD to decrease risk of falls.     Time  8    Period  Weeks    Status  On-going   scored 27.5 sec on TUG with SPC; much improved since last measured     PT LONG TERM GOAL #5   Title  Patient to report 75% increased confidence in ability to ambulate on slick level surfaces with LRAD.    Time  8    Period  Weeks    Status  On-going   reports 25-30% improvement in confidence           Plan - 09/06/18 1557    Clinical Impression Statement  Patient arrived to session with no new complaints. Reviewed standing marching and hamstring curls with counter top support today as patient mentioned difficulty with these exercises at home. Patient with c/o L sided lateral thigh pain and LBP after working on L SLS. Patient requiring heavy manual and verbal cues to promote L weight shift as patient with impaired perception of midline causing L sided avoidance. Addressed pain with LE stretching. Good hip stability and form with supine bridges with adduction challenge today. Updated HEP to include supine resisted hip flexion to address hip weakness. Patient reported understanding and with no c/o pain at end of session.     PT Treatment/Interventions  ADLs/Self Care Home Management;Cryotherapy;Electrical Stimulation;Moist Heat;Ultrasound;DME Instruction;Gait  training;Stair training;Functional mobility training;Therapeutic activities;Therapeutic exercise;Manual techniques;Orthotic  Fit/Training;Patient/family education;Neuromuscular re-education;Balance training;Passive range of motion;Dry needling;Energy conservation;Splinting;Taping    Consulted and Agree with Plan of Care  Patient       Patient will benefit from skilled therapeutic intervention in order to improve the following deficits and impairments:  Abnormal gait, Decreased endurance, Decreased activity tolerance, Decreased strength, Pain, Decreased balance, Decreased mobility, Difficulty walking, Postural dysfunction, Impaired flexibility  Visit Diagnosis: Muscle weakness (generalized)  Paralytic gait  Unsteadiness on feet     Problem List Patient Active Problem List   Diagnosis Date Noted  . Vertigo 09/24/2017  . Benign paroxysmal positional vertigo   . History of stroke   . Hyperlipemia 08/23/2017  . Encounter for therapeutic drug monitoring 08/08/2017  . Long term (current) use of anticoagulants [Z79.01] 07/12/2017  . Hemiparesis and alteration of sensations as late effects of stroke ([place]) 09/07/2016  . Abnormality of gait 09/07/2016  . HTN (hypertension) 04/13/2016  . H/O mitral valve replacement with mechanical valve 10/16/2015  . Diabetes mellitus without complication ([place])   . 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  [place], [place], [address] Phone: [phone]   Fax:  [phone]  Name: [name] MRN: [id number] Date of Birth: 09-12-1946

## 2018-09-11 ENCOUNTER — Ambulatory Visit (INDEPENDENT_AMBULATORY_CARE_PROVIDER_SITE_OTHER): Payer: Medicare Other

## 2018-09-11 ENCOUNTER — Ambulatory Visit: Payer: Medicare Other | Attending: Internal Medicine

## 2018-09-11 DIAGNOSIS — R261 Paralytic gait: Secondary | ICD-10-CM | POA: Insufficient documentation

## 2018-09-11 DIAGNOSIS — Z5181 Encounter for therapeutic drug level monitoring: Secondary | ICD-10-CM | POA: Diagnosis not present

## 2018-09-11 DIAGNOSIS — M6281 Muscle weakness (generalized): Secondary | ICD-10-CM | POA: Diagnosis not present

## 2018-09-11 DIAGNOSIS — Z7901 Long term (current) use of anticoagulants: Secondary | ICD-10-CM

## 2018-09-11 DIAGNOSIS — I69359 Hemiplegia and hemiparesis following cerebral infarction affecting unspecified side: Secondary | ICD-10-CM

## 2018-09-11 DIAGNOSIS — Z952 Presence of prosthetic heart valve: Secondary | ICD-10-CM

## 2018-09-11 DIAGNOSIS — R2681 Unsteadiness on feet: Secondary | ICD-10-CM

## 2018-09-11 LAB — POCT INR: INR: 3.1 — AB (ref 2.0–3.0)

## 2018-09-11 NOTE — Patient Instructions (Signed)
Description   Spoke with pt & instructed her to continue on same dosage 5mg daily except 7.5mg on Mondays, Wednesdays and Fridays. Recheck INR in 1 week.  Does weekly checks due to insurance.     

## 2018-09-11 NOTE — Therapy (Signed)
[facility] [address]  [place], [place], [address] Phone: [phone]   Fax:  [phone]  Physical Therapy Treatment  Patient Details  Name: [name] MRN: [id number] Date of Birth: [birth year] Referring Provider (PT): Domenick Gong, MD   Encounter Date: 09/11/2018  PT End of Session - 09/11/18 1317    Visit Number  12    Number of Visits  17    Date for PT Re-Evaluation  09/25/18    Authorization Type  Medicare    PT Start Time  3875    PT Stop Time  1357    PT Time Calculation (min)  44 min    Activity Tolerance  Patient tolerated treatment well;Patient limited by pain    Behavior During Therapy  [name] for tasks assessed/performed       Past Medical History:  Diagnosis Date  . Abnormality of gait 09/07/2016  . Allergy   . Diabetes mellitus without complication [facility])    Patient denies this - notes history of glucose intolerance  . Hemiparesis and alteration of sensations as late effects of stroke ([place]) 09/07/2016  . History of pneumonia 1997  . S/P MVR (mitral valve replacement)    Mechanical mitral valve replacement at age 48 (done in [place])  // echo 7/17: EF 55-60, normal wall motion, bileaflet mechanical mitral valve prosthesis functioning normally, mild LAE, mildly reduced RVSF, small pericardial effusion  . Stroke [facility]) 1997, 2013, 2015    Past Surgical History:  Procedure Laterality Date  . ABDOMINAL HYSTERECTOMY    . BRAIN SURGERY    . BUNIONECTOMY  1993  . CARDIAC VALVE REPLACEMENT  1997  . TOE SURGERY  1996    There were no vitals filed for this visit.  Subjective Assessment - 09/11/18 1316    Subjective  Pt. doing well today.      Diagnostic tests  none recent    Patient Stated Goals  getting more confidence with walking     Currently in Pain?  No/denies    Pain Score  0-No pain    Multiple Pain Sites  No                       [facility] Adult PT  Treatment/Exercise - 09/11/18 1342      Knee/Hip Exercises: Aerobic   Nustep  Lvl 4, 6 min       Knee/Hip Exercises: Standing   Functional Squat  15 reps    Functional Squat Limitations  to chair with airex     Other Standing Knee Exercises  Side stepping, marching x 3 laps each at counter with 1 UE support 3# at ankles and supervision       Knee/Hip Exercises: Seated   Long Arc Quad  Right;Left;10 reps;Strengthening    Long Arc Quad Limitations  Red looped TB     Other Seated Knee/Hip Exercises  5 x sit<>stand working on speed of movement and control with lowering     Hamstring Curl  10 reps;Right;Left;Strengthening    Hamstring Limitations  red TB    Sit to Sand  10 reps;without UE support               PT Short Term Goals - 08/30/18 1108      PT SHORT TERM GOAL #1   Title  Patient to be independent with initial HEP.    Time  4    Period  Weeks  Status  Achieved        PT Long Term Goals - 08/30/18 1108      PT LONG TERM GOAL #1   Title  Patient to be independent with advanced HEP.    Time  8    Period  Weeks    Status  On-going   met for current     PT LONG TERM GOAL #2   Title  Patient to demonstrate 4+/5 strength in B hips to encourage proper gait pattern.    Time  8    Period  Weeks    Status  Partially Met   improvement noted in B hip flexion, B hip abduction, R knee flexion and extension     PT LONG TERM GOAL #3   Title  Patient to score <15 sec on 5xSTS without UE support to decrease risk of falls.    Time  8    Period  Weeks    Status  On-going   scored 24.9 sec today; much improved since last measured     PT LONG TERM GOAL #4   Title  Patient to score <14 sec on TUG with LRAD to decrease risk of falls.     Time  8    Period  Weeks    Status  On-going   scored 27.5 sec on TUG with SPC; much improved since last measured     PT LONG TERM GOAL #5   Title  Patient to report 75% increased confidence in ability to ambulate on slick level  surfaces with LRAD.    Time  8    Period  Weeks    Status  On-going   reports 25-30% improvement in confidence           Plan - 09/11/18 1318    Clinical Impression Statement  Session focusing on lower extremity strengthening aimed at improving pt. sit<>stand stability from lower surfaces and proximal hip strengthening to improve stability with gait.  Pt. with most difficulty with BOSU ball alternating stepping and very apprehensive of falling.  Making progress toward 5x sit<>stand goal however not formally measured today.  Will continue to progress toward goals.      PT Treatment/Interventions  ADLs/Self Care Home Management;Cryotherapy;Electrical Stimulation;Moist Heat;Ultrasound;DME Instruction;Gait training;Stair training;Functional mobility training;Therapeutic activities;Therapeutic exercise;Manual techniques;Orthotic Fit/Training;Patient/family education;Neuromuscular re-education;Balance training;Passive range of motion;Dry needling;Energy conservation;Splinting;Taping    Consulted and Agree with Plan of Care  Patient       Patient will benefit from skilled therapeutic intervention in order to improve the following deficits and impairments:  Abnormal gait, Decreased endurance, Decreased activity tolerance, Decreased strength, Pain, Decreased balance, Decreased mobility, Difficulty walking, Postural dysfunction, Impaired flexibility  Visit Diagnosis: Muscle weakness (generalized)  Paralytic gait  Unsteadiness on feet     Problem List Patient Active Problem List   Diagnosis Date Noted  . Vertigo 09/24/2017  . Benign paroxysmal positional vertigo   . History of stroke   . Hyperlipemia 08/23/2017  . Encounter for therapeutic drug monitoring 08/08/2017  . Long term (current) use of anticoagulants [Z79.01] 07/12/2017  . Hemiparesis and alteration of sensations as late effects of stroke ([place]) 09/07/2016  . Abnormality of gait 09/07/2016  . HTN (hypertension) 04/13/2016  .  H/O mitral valve replacement with mechanical valve 10/16/2015  . Diabetes mellitus without complication ([place])   . 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)     Bess Harvest, [place] 09/11/18 6:32 PM   [facility] [address]  [place], [place], [address] Phone: [phone]   Fax:  [phone]  Name: [name] MRN: [id number] Date of Birth: January 15, 1946

## 2018-09-13 ENCOUNTER — Ambulatory Visit: Payer: Medicare Other

## 2018-09-13 DIAGNOSIS — R2681 Unsteadiness on feet: Secondary | ICD-10-CM | POA: Diagnosis not present

## 2018-09-13 DIAGNOSIS — M6281 Muscle weakness (generalized): Secondary | ICD-10-CM

## 2018-09-13 DIAGNOSIS — R261 Paralytic gait: Secondary | ICD-10-CM | POA: Diagnosis not present

## 2018-09-13 NOTE — Therapy (Signed)
[facility] [address]  [place], [place], [address] Phone: [phone]   Fax:  [phone]  Physical Therapy Treatment  Patient Details  Name: [name] MRN: [id number] Date of Birth: 03/21/1946 Referring Provider (PT): Domenick Gong, MD   Encounter Date: 09/13/2018  PT End of Session - 09/13/18 1315    Visit Number  13    Number of Visits  17    Date for PT Re-Evaluation  09/25/18    Authorization Type  Medicare    PT Start Time  1312    PT Stop Time  1350    PT Time Calculation (min)  38 min    Activity Tolerance  Patient tolerated treatment well;Patient limited by pain    Behavior During Therapy  [facility] for tasks assessed/performed       Past Medical History:  Diagnosis Date  . Abnormality of gait 09/07/2016  . Allergy   . Diabetes mellitus without complication [facility])    Patient denies this - notes history of glucose intolerance  . Hemiparesis and alteration of sensations as late effects of stroke ([place]) 09/07/2016  . History of pneumonia 1997  . S/P MVR (mitral valve replacement)    Mechanical mitral valve replacement at age 68 (done in [place])  // echo 7/17: EF 55-60, normal wall motion, bileaflet mechanical mitral valve prosthesis functioning normally, mild LAE, mildly reduced RVSF, small pericardial effusion  . Stroke [facility]) 1997, 2013, 2015    Past Surgical History:  Procedure Laterality Date  . ABDOMINAL HYSTERECTOMY    . BRAIN SURGERY    . BUNIONECTOMY  1993  . CARDIAC VALVE REPLACEMENT  1997  . TOE SURGERY  1996    There were no vitals filed for this visit.  Subjective Assessment - 09/13/18 1316    Subjective  Pt. reporting she felt fine after last visit.  reports MD f/u on December 4th.      Diagnostic tests  none recent    Patient Stated Goals  getting more confidence with walking     Currently in Pain?  No/denies    Pain Score  0-No pain    Multiple Pain Sites  No          [facility] PT Assessment - 09/13/18 1317      Assessment   Medical Diagnosis  Gait imbalance    Referring Provider (PT)  Domenick Gong, MD    Hand Dominance  Right    Next MD Visit  10/11/18    Prior Therapy  Yes- after strokes                   [facility] Adult PT Treatment/Exercise - 09/13/18 1326      Neuro Re-ed    Neuro Re-ed Details   Side stepping, monster walk forw/back with yellow looped band at ankles 2 x 25 ft with 2 HH support; horizontal head turns, vertical standing on airex pad in narrow stance 2 x 30 sec each;CGA/supervision       Knee/Hip Exercises: Aerobic   Nustep  Lvl 4, 6 min       Knee/Hip Exercises: Standing   Forward Step Up  Right;Left;10 reps;Hand Hold: 1    Forward Step Up Limitations  onto airex pad      Knee/Hip Exercises: Seated   Other Seated Knee/Hip Exercises  B fitter leg press (1 black band) x 15 rpes each     Hamstring Curl  10 reps;Right;Left;Strengthening  Hamstring Limitations  green TB               PT Short Term Goals - 08/30/18 1108      PT SHORT TERM GOAL #1   Title  Patient to be independent with initial HEP.    Time  4    Period  Weeks    Status  Achieved        PT Long Term Goals - 08/30/18 1108      PT LONG TERM GOAL #1   Title  Patient to be independent with advanced HEP.    Time  8    Period  Weeks    Status  On-going   met for current     PT LONG TERM GOAL #2   Title  Patient to demonstrate 4+/5 strength in B hips to encourage proper gait pattern.    Time  8    Period  Weeks    Status  Partially Met   improvement noted in B hip flexion, B hip abduction, R knee flexion and extension     PT LONG TERM GOAL #3   Title  Patient to score <15 sec on 5xSTS without UE support to decrease risk of falls.    Time  8    Period  Weeks    Status  On-going   scored 24.9 sec today; much improved since last measured     PT LONG TERM GOAL #4   Title  Patient to score <14 sec on TUG with LRAD to  decrease risk of falls.     Time  8    Period  Weeks    Status  On-going   scored 27.5 sec on TUG with SPC; much improved since last measured     PT LONG TERM GOAL #5   Title  Patient to report 75% increased confidence in ability to ambulate on slick level surfaces with LRAD.    Time  8    Period  Weeks    Status  On-going   reports 25-30% improvement in confidence           Plan - 09/13/18 1315    Clinical Impression Statement  Pt. seen today reporting difficulty with standing HS curl.  Reviewed this with pt. today with pt. tolerating well.  Most difficulty today with standing balance on airex pad with head turns.  Able to progress to side stepping with band resistance at ankles with hand support and forward step-ups onto airex pad.  Pt. remains very apprehensive of falling however able to demonstrate greater ease with sit<>stand transitions and now reports her husband notes improved stability with her curb navigation.  Will continue to progress toward goals.       PT Treatment/Interventions  ADLs/Self Care Home Management;Cryotherapy;Electrical Stimulation;Moist Heat;Ultrasound;DME Instruction;Gait training;Stair training;Functional mobility training;Therapeutic activities;Therapeutic exercise;Manual techniques;Orthotic Fit/Training;Patient/family education;Neuromuscular re-education;Balance training;Passive range of motion;Dry needling;Energy conservation;Splinting;Taping    Consulted and Agree with Plan of Care  Patient       Patient will benefit from skilled therapeutic intervention in order to improve the following deficits and impairments:  Abnormal gait, Decreased endurance, Decreased activity tolerance, Decreased strength, Pain, Decreased balance, Decreased mobility, Difficulty walking, Postural dysfunction, Impaired flexibility  Visit Diagnosis: Muscle weakness (generalized)  Paralytic gait  Unsteadiness on feet     Problem List Patient Active Problem List    Diagnosis Date Noted  . Vertigo 09/24/2017  . Benign paroxysmal positional vertigo   . History of stroke   .  Hyperlipemia 08/23/2017  . Encounter for therapeutic drug monitoring 08/08/2017  . Long term (current) use of anticoagulants [Z79.01] 07/12/2017  . Hemiparesis and alteration of sensations as late effects of stroke ([place]) 09/07/2016  . Abnormality of gait 09/07/2016  . HTN (hypertension) 04/13/2016  . H/O mitral valve replacement with mechanical valve 10/16/2015  . Diabetes mellitus without complication ([place])   . 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  [place], [place], [address] Phone: [phone]   Fax:  [phone]  Name: [name] MRN: [id number] Date of Birth: November 24, 1945

## 2018-09-18 ENCOUNTER — Ambulatory Visit: Payer: Medicare Other

## 2018-09-18 ENCOUNTER — Ambulatory Visit (INDEPENDENT_AMBULATORY_CARE_PROVIDER_SITE_OTHER): Payer: Medicare Other | Admitting: Cardiovascular Disease

## 2018-09-18 DIAGNOSIS — R261 Paralytic gait: Secondary | ICD-10-CM

## 2018-09-18 DIAGNOSIS — I69359 Hemiplegia and hemiparesis following cerebral infarction affecting unspecified side: Secondary | ICD-10-CM | POA: Diagnosis not present

## 2018-09-18 DIAGNOSIS — Z952 Presence of prosthetic heart valve: Secondary | ICD-10-CM | POA: Diagnosis not present

## 2018-09-18 DIAGNOSIS — Z5181 Encounter for therapeutic drug level monitoring: Secondary | ICD-10-CM | POA: Diagnosis not present

## 2018-09-18 DIAGNOSIS — R2681 Unsteadiness on feet: Secondary | ICD-10-CM

## 2018-09-18 DIAGNOSIS — M6281 Muscle weakness (generalized): Secondary | ICD-10-CM

## 2018-09-18 DIAGNOSIS — Z7901 Long term (current) use of anticoagulants: Secondary | ICD-10-CM

## 2018-09-18 LAB — POCT INR: INR: 3.7 — AB (ref 2.0–3.0)

## 2018-09-18 NOTE — Therapy (Signed)
[facility] [address]  [place], [place], [address] Phone: [phone]   Fax:  [phone]  Physical Therapy Treatment  Patient Details  Name: [name] MRN: [id number] Date of Birth: December 04, 1945 Referring Provider (PT): Domenick Gong, MD   Encounter Date: 09/18/2018  PT End of Session - 09/18/18 1303    Visit Number  14    Number of Visits  17    Date for PT Re-Evaluation  09/25/18    Authorization Type  Medicare    PT Start Time  4967    PT Stop Time  1350    PT Time Calculation (min)  45 min    Activity Tolerance  Patient tolerated treatment well;Patient limited by pain    Behavior During Therapy  [facility] for tasks assessed/performed       Past Medical History:  Diagnosis Date  . Abnormality of gait 09/07/2016  . Allergy   . Diabetes mellitus without complication [facility])    Patient denies this - notes history of glucose intolerance  . Hemiparesis and alteration of sensations as late effects of stroke ([place]) 09/07/2016  . History of pneumonia 1997  . S/P MVR (mitral valve replacement)    Mechanical mitral valve replacement at age 33 (done in [place])  // echo 7/17: EF 55-60, normal wall motion, bileaflet mechanical mitral valve prosthesis functioning normally, mild LAE, mildly reduced RVSF, small pericardial effusion  . Stroke [facility]) 1997, 2013, 2015    Past Surgical History:  Procedure Laterality Date  . ABDOMINAL HYSTERECTOMY    . BRAIN SURGERY    . BUNIONECTOMY  1993  . CARDIAC VALVE REPLACEMENT  1997  . TOE SURGERY  1996    There were no vitals filed for this visit.  Subjective Assessment - 09/18/18 1302    Subjective  Doing well today with new complaints.      Diagnostic tests  none recent    Patient Stated Goals  getting more confidence with walking     Currently in Pain?  No/denies    Pain Score  0-No pain   2/10 when shifting weight onto L side    Pain Location  Leg    Pain  Orientation  Left;Anterior    Pain Descriptors / Indicators  Tightness    Pain Type  Chronic pain    Multiple Pain Sites  No         [facility] PT Assessment - 09/18/18 1316      Timed Up and Go Test   TUG  Normal TUG    Normal TUG (seconds)  19.05   with SPC; Trial #1 18.56 sec; 19.53 sec trial #2                  [facility] Adult PT Treatment/Exercise - 09/18/18 1324      Knee/Hip Exercises: Aerobic   Nustep  Lvl 5, 7 min       Knee/Hip Exercises: Standing   Hip Flexion  Stengthening;Both;1 set;20 reps;Knee bent;Limitations    Hip Abduction  Right;Left;Knee straight   x 12 reps   Abduction Limitations  yellow looped TB at ankles; 2 HH support    Hip Extension  Right;Left;Stengthening;Knee straight   x 12 reps    Extension Limitations  yellow TB at ankles; 2 HH support    Other Standing Knee Exercises  R/L lateral step-up to airex pad x 10 reps     Other Standing Knee Exercises  forward step-up and  over airex pad x 15 reps       Knee/Hip Exercises: Seated   Other Seated Knee/Hip Exercises  B fitter leg press (1 black band, 1 blue) x 10 rpes each                PT Short Term Goals - 08/30/18 1108      PT SHORT TERM GOAL #1   Title  Patient to be independent with initial HEP.    Time  4    Period  Weeks    Status  Achieved        PT Long Term Goals - 09/18/18 1359      PT LONG TERM GOAL #1   Title  Patient to be independent with advanced HEP.    Time  8    Period  Weeks    Status  On-going   met for current     PT LONG TERM GOAL #2   Title  Patient to demonstrate 4+/5 strength in B hips to encourage proper gait pattern.    Time  8    Period  Weeks    Status  Partially Met   improvement noted in B hip flexion, B hip abduction, R knee flexion and extension     PT LONG TERM GOAL #3   Title  Patient to score <15 sec on 5xSTS without UE support to decrease risk of falls.    Time  8    Period  Weeks    Status  On-going      PT LONG TERM GOAL #4    Title  Patient to score <14 sec on TUG with LRAD to decrease risk of falls.     Time  8    Period  Weeks    Status  On-going   scored 19.05 sec on TUG with SPC; much improved since last measured     PT LONG TERM GOAL #5   Title  Patient to report 75% increased confidence in ability to ambulate on slick level surfaces with LRAD.    Time  8    Period  Weeks    Status  On-going   reports 25-30% improvement in confidence           Plan - 09/18/18 1303    Clinical Impression Statement  Pt. doing well today with no new complaints.  Denies recent falls.  Feels more comfortable walking on smooth floors now.  Able to progress proximal hip strengthening activities today without issue however most difficulty with side-stepping onto airex pad requiring supervision/CGA.  Pt. still requiring increased time for movement planning however appears to be navigating standing activities with increased stability and notes improved ability to rise from low surfaces.  Significant improvement in TUG time to 19.05 sec today with SPC.  Ended visit pain free.  Will continue to progress toward goals.      PT Treatment/Interventions  ADLs/Self Care Home Management;Cryotherapy;Electrical Stimulation;Moist Heat;Ultrasound;DME Instruction;Gait training;Stair training;Functional mobility training;Therapeutic activities;Therapeutic exercise;Manual techniques;Orthotic Fit/Training;Patient/family education;Neuromuscular re-education;Balance training;Passive range of motion;Dry needling;Energy conservation;Splinting;Taping    Consulted and Agree with Plan of Care  Patient       Patient will benefit from skilled therapeutic intervention in order to improve the following deficits and impairments:  Abnormal gait, Decreased endurance, Decreased activity tolerance, Decreased strength, Pain, Decreased balance, Decreased mobility, Difficulty walking, Postural dysfunction, Impaired flexibility  Visit Diagnosis: Muscle weakness  (generalized)  Paralytic gait  Unsteadiness on feet     Problem List Patient Active  Problem List   Diagnosis Date Noted  . Vertigo 09/24/2017  . Benign paroxysmal positional vertigo   . History of stroke   . Hyperlipemia 08/23/2017  . Encounter for therapeutic drug monitoring 08/08/2017  . Long term (current) use of anticoagulants [Z79.01] 07/12/2017  . Hemiparesis and alteration of sensations as late effects of stroke ([place]) 09/07/2016  . Abnormality of gait 09/07/2016  . HTN (hypertension) 04/13/2016  . H/O mitral valve replacement with mechanical valve 10/16/2015  . Diabetes mellitus without complication ([place])   . 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

## 2018-09-20 ENCOUNTER — Ambulatory Visit: Payer: Medicare Other

## 2018-09-20 DIAGNOSIS — R261 Paralytic gait: Secondary | ICD-10-CM | POA: Diagnosis not present

## 2018-09-20 DIAGNOSIS — M6281 Muscle weakness (generalized): Secondary | ICD-10-CM | POA: Diagnosis not present

## 2018-09-20 DIAGNOSIS — R2681 Unsteadiness on feet: Secondary | ICD-10-CM

## 2018-09-20 NOTE — Therapy (Signed)
[facility] [address]  [place], [place], [address] Phone: [phone]   Fax:  [phone]  Physical Therapy Treatment  Patient Details  Name: [name] MRN: [id number] Date of Birth: 1946/02/14 Referring Provider (PT): Domenick Gong, MD   Encounter Date: 09/20/2018  PT End of Session - 09/20/18 1300    Visit Number  15    Number of Visits  17    Date for PT Re-Evaluation  09/25/18    Authorization Type  Medicare    PT Start Time  7408    PT Stop Time  1352    PT Time Calculation (min)  56 min    Activity Tolerance  Patient tolerated treatment well;Patient limited by pain    Behavior During Therapy  [facility] for tasks assessed/performed       Past Medical History:  Diagnosis Date  . Abnormality of gait 09/07/2016  . Allergy   . Diabetes mellitus without complication [facility])    Patient denies this - notes history of glucose intolerance  . Hemiparesis and alteration of sensations as late effects of stroke ([place]) 09/07/2016  . History of pneumonia 1997  . S/P MVR (mitral valve replacement)    Mechanical mitral valve replacement at age 28 (done in [place])  // echo 7/17: EF 55-60, normal wall motion, bileaflet mechanical mitral valve prosthesis functioning normally, mild LAE, mildly reduced RVSF, small pericardial effusion  . Stroke [facility]) 1997, 2013, 2015    Past Surgical History:  Procedure Laterality Date  . ABDOMINAL HYSTERECTOMY    . BRAIN SURGERY    . BUNIONECTOMY  1993  . CARDIAC VALVE REPLACEMENT  1997  . TOE SURGERY  1996    There were no vitals filed for this visit.  Subjective Assessment - 09/20/18 1300    Subjective  Doing well today with new complaints.      Diagnostic tests  none recent    Patient Stated Goals  getting more confidence with walking     Currently in Pain?  No/denies    Pain Score  0-No pain    Multiple Pain Sites  No                       [facility] Adult PT  Treatment/Exercise - 09/20/18 1312      Ambulation/Gait   Ambulation/Gait  Yes    Ambulation/Gait Assistance  5: Supervision    Ambulation/Gait Assistance Details  Pt. ambulating with poor LE clearance and L circumducting gait pattern however improved stability from previous visits; Cueing provided for increased LE clearance and increased L hip/knee flexion to avoid circumduction     Ambulation Distance (Feet)  180 Feet    Assistive device  Straight cane    Gait Pattern  Step-through pattern;Left circumduction      Knee/Hip Exercises: Aerobic   Nustep  Lvl 5, 6 min (UE/LE)      Knee/Hip Exercises: Standing   Knee Flexion  Strengthening;Right;Left;1 set;Limitations;15 reps    Knee Flexion Limitations  2 HH assistance from therapist     Other Standing Knee Exercises  B side stepping with yellow TB at ankles and 2 HH assist 2 x 30 ft     Other Standing Knee Exercises  L forward step-up to airex pad with R UE reach across body to cone on bolster x 10 reps; L UE support HH from therapist       Knee/Hip Exercises: Seated   Other  Seated Knee/Hip Exercises  B fitter leg press (1 black band, 1 blue) x 15 rpes each     Hamstring Curl  Right;Left;Strengthening;15 reps    Hamstring Limitations  green TB    Sit to Sand  10 reps;without UE support   with yellow TB at knees             PT Education - 09/20/18 1412    Education Details  HEP update; yellow looped TB issued to pt.     Person(s) Educated  Patient    Methods  Explanation;Demonstration;Verbal cues;Handout    Comprehension  Verbalized understanding;Returned demonstration;Verbal cues required;Need further instruction       PT Short Term Goals - 08/30/18 1108      PT SHORT TERM GOAL #1   Title  Patient to be independent with initial HEP.    Time  4    Period  Weeks    Status  Achieved        PT Long Term Goals - 09/18/18 1359      PT LONG TERM GOAL #1   Title  Patient to be independent with advanced HEP.    Time  8     Period  Weeks    Status  On-going   met for current     PT LONG TERM GOAL #2   Title  Patient to demonstrate 4+/5 strength in B hips to encourage proper gait pattern.    Time  8    Period  Weeks    Status  Partially Met   improvement noted in B hip flexion, B hip abduction, R knee flexion and extension     PT LONG TERM GOAL #3   Title  Patient to score <15 sec on 5xSTS without UE support to decrease risk of falls.    Time  8    Period  Weeks    Status  On-going      PT LONG TERM GOAL #4   Title  Patient to score <14 sec on TUG with LRAD to decrease risk of falls.     Time  8    Period  Weeks    Status  On-going   scored 19.05 sec on TUG with SPC; much improved since last measured     PT LONG TERM GOAL #5   Title  Patient to report 75% increased confidence in ability to ambulate on slick level surfaces with LRAD.    Time  8    Period  Weeks    Status  On-going   reports 25-30% improvement in confidence           Plan - 09/20/18 1412    Clinical Impression Statement  [name] performed well with all LE strengthening and standing balance activities in today's session.  Unsure if she wishes to continue with therapy after next visit however made aware that next visit is last visit in current POC.  Making progress toward goals.    PT Treatment/Interventions  ADLs/Self Care Home Management;Cryotherapy;Electrical Stimulation;Moist Heat;Ultrasound;DME Instruction;Gait training;Stair training;Functional mobility training;Therapeutic activities;Therapeutic exercise;Manual techniques;Orthotic Fit/Training;Patient/family education;Neuromuscular re-education;Balance training;Passive range of motion;Dry needling;Energy conservation;Splinting;Taping    Consulted and Agree with Plan of Care  Patient       Patient will benefit from skilled therapeutic intervention in order to improve the following deficits and impairments:  Abnormal gait, Decreased endurance, Decreased activity  tolerance, Decreased strength, Pain, Decreased balance, Decreased mobility, Difficulty walking, Postural dysfunction, Impaired flexibility  Visit Diagnosis: Muscle weakness (generalized)  Paralytic  gait  Unsteadiness on feet     Problem List Patient Active Problem List   Diagnosis Date Noted  . Vertigo 09/24/2017  . Benign paroxysmal positional vertigo   . History of stroke   . Hyperlipemia 08/23/2017  . Encounter for therapeutic drug monitoring 08/08/2017  . Long term (current) use of anticoagulants [Z79.01] 07/12/2017  . Hemiparesis and alteration of sensations as late effects of stroke ([place]) 09/07/2016  . Abnormality of gait 09/07/2016  . HTN (hypertension) 04/13/2016  . H/O mitral valve replacement with mechanical valve 10/16/2015  . Diabetes mellitus without complication ([place])   . 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  [place], [place], [address] Phone: [phone]   Fax:  [phone]  Name: [name] MRN: [id number] Date of Birth: 1945/11/11

## 2018-09-25 ENCOUNTER — Ambulatory Visit: Payer: Medicare Other | Admitting: Physical Therapy

## 2018-09-25 ENCOUNTER — Ambulatory Visit (INDEPENDENT_AMBULATORY_CARE_PROVIDER_SITE_OTHER): Payer: Medicare Other | Admitting: Pharmacist

## 2018-09-25 ENCOUNTER — Encounter: Payer: Self-pay | Admitting: Physical Therapy

## 2018-09-25 DIAGNOSIS — R261 Paralytic gait: Secondary | ICD-10-CM

## 2018-09-25 DIAGNOSIS — M6281 Muscle weakness (generalized): Secondary | ICD-10-CM

## 2018-09-25 DIAGNOSIS — I69359 Hemiplegia and hemiparesis following cerebral infarction affecting unspecified side: Secondary | ICD-10-CM

## 2018-09-25 DIAGNOSIS — Z952 Presence of prosthetic heart valve: Secondary | ICD-10-CM

## 2018-09-25 DIAGNOSIS — R2681 Unsteadiness on feet: Secondary | ICD-10-CM

## 2018-09-25 DIAGNOSIS — Z7901 Long term (current) use of anticoagulants: Secondary | ICD-10-CM | POA: Diagnosis not present

## 2018-09-25 DIAGNOSIS — Z5181 Encounter for therapeutic drug level monitoring: Secondary | ICD-10-CM | POA: Diagnosis not present

## 2018-09-25 LAB — POCT INR: INR: 3.5 — AB (ref 2.0–3.0)

## 2018-09-25 NOTE — Therapy (Signed)
[facility] [address]  [place], [place], [address] Phone: [phone]   Fax:  [phone]  Physical Therapy Discharge Summary  Patient Details  Name: [name] MRN: [id number] Date of Birth: 1945-11-13 Referring Provider (PT): Domenick Gong, MD   Progress Note Reporting Period 09/06/18 to 09/25/18  See note below for Objective Data and Assessment of Progress/Goals.    Encounter Date: 09/25/2018  PT End of Session - 09/25/18 1407    Visit Number  16    Number of Visits  17    Date for PT Re-Evaluation  09/25/18    Authorization Type  Medicare    PT Start Time  1309    PT Stop Time  1403    PT Time Calculation (min)  54 min    Activity Tolerance  Patient tolerated treatment well    Behavior During Therapy  WFL for tasks assessed/performed       Past Medical History:  Diagnosis Date  . Abnormality of gait 09/07/2016  . Allergy   . Diabetes mellitus without complication [facility])    Patient denies this - notes history of glucose intolerance  . Hemiparesis and alteration of sensations as late effects of stroke ([place]) 09/07/2016  . History of pneumonia 1997  . S/P MVR (mitral valve replacement)    Mechanical mitral valve replacement at age 72 (done in [place])  // echo 7/17: EF 55-60, normal wall motion, bileaflet mechanical mitral valve prosthesis functioning normally, mild LAE, mildly reduced RVSF, small pericardial effusion  . Stroke [facility]) 1997, 2013, 2015    Past Surgical History:  Procedure Laterality Date  . ABDOMINAL HYSTERECTOMY    . BRAIN SURGERY    . BUNIONECTOMY  1993  . CARDIAC VALVE REPLACEMENT  1997  . TOE SURGERY  1996    There were no vitals filed for this visit.  Subjective Assessment - 09/25/18 1304    Subjective  Patient reports she does not want to try stairs in the staircase because it is too open and she is scared. Reports she has started using the stairs to the bonus room  at home. Reports she does much better walking on slick floors than she used to. Walking up/down curbs is also easier.     Diagnostic tests  none recent    Patient Stated Goals  getting more confidence with walking     Currently in Pain?  No/denies         [facility] PT Assessment - 09/25/18 0001      Strength   Strength Assessment Site  Hip    Right/Left Hip  Right;Left    Right Hip Flexion  4+/5    Right Hip ABduction  4+/5    Right Hip ADduction  4+/5    Left Hip Flexion  4/5    Left Hip ABduction  4+/5    Left Hip ADduction  4+/5      Standardized Balance Assessment   Five times sit to stand comments   21.7      Timed Up and Go Test   TUG  Normal TUG    Normal TUG (seconds)  15   with SPC                  [facility] Adult PT Treatment/Exercise - 09/25/18 0001      Knee/Hip Exercises: Aerobic   Nustep  Lvl 3, 6 min (UE/LE)      Knee/Hip Exercises: Seated  Hamstring Curl  Strengthening;Left;2 sets;10 reps;Limitations    Hamstring Limitations  red TB around ankle    Sit to Sand  without UE support;1 set   5x with red TB around knees     Knee/Hip Exercises: Supine   Bridges  Strengthening;Both;Limitations;1 set;10 reps    Bridges Limitations  red TB around knees    Bridges with [employer]  Strengthening;Both;1 set;10 reps             PT Education - 09/25/18 1406    Education Details  significant update, review, and consolidation of HEP; edu about maintaining fitness regimen at home, explanation of progress and today's test results    Person(s) Educated  Patient    Methods  Explanation;Demonstration;Tactile cues;Verbal cues;Handout    Comprehension  Verbalized understanding;Returned demonstration       PT Short Term Goals - 09/25/18 1316      PT SHORT TERM GOAL #1   Title  Patient to be independent with initial HEP.    Time  4    Period  Weeks    Status  Achieved        PT Long Term Goals - 09/25/18 1316      PT LONG TERM GOAL #1   Title   Patient to be independent with advanced HEP.    Time  8    Period  Weeks    Status  Achieved      PT LONG TERM GOAL #2   Title  Patient to demonstrate 4+/5 strength in B hips to encourage proper gait pattern.    Time  8    Period  Weeks    Status  Partially Met   good improvement noted in all planes of hip motion, only remaining limitation in L hip flexion     PT LONG TERM GOAL #3   Title  Patient to score <15 sec on 5xSTS without UE support to decrease risk of falls.    Time  8    Period  Weeks    Status  Partially Met   scored 21.7 sec on 5xSTS without UEs, showing large improvement since initial eval     PT LONG TERM GOAL #4   Title  Patient to score <14 sec on TUG with LRAD to decrease risk of falls.     Time  8    Period  Weeks    Status  Partially Met   scored 15.0 sec on TUG with SPC; much improved since last measured     PT LONG TERM GOAL #5   Title  Patient to report 75% increased confidence in ability to ambulate on slick level surfaces with LRAD.    Time  8    Period  Weeks    Status  Achieved   reports 80% improvement in confidence           Plan - 09/25/18 1833    Clinical Impression Statement  Patient arrived to session reporting significant improvements in functional activity tolerance and confidence such as starting to use the stairs in her house, walking on slick floors, and maneuvering up/down curbs. Reports 80% improvement in confidence when ambulating on slick floors, noting that she would avoid doing it altogether before starting PT. Good improvement demonstrated in all planes of hip motion strength, only remaining limitation in L hip flexion. Patient scored 21.7 sec on 5xSTS without UEs, showing large improvement since initial eval. Also scored 15.0 sec on TUG with SPC; patient has managed to  cut her initial time in half with this test. Took time to review and consolidate HEP with patient. Introduced sitting hamstring curl with banded resistance as  patient reporting trouble with this exercise in standing. Progressed STS with banded resistance around knees to challenge hip strength. Patient pleased with CLOF and D/C'd at this time.     PT Treatment/Interventions  ADLs/Self Care Home Management;Cryotherapy;Electrical Stimulation;Moist Heat;Ultrasound;DME Instruction;Gait training;Stair training;Functional mobility training;Therapeutic activities;Therapeutic exercise;Manual techniques;Orthotic Fit/Training;Patient/family education;Neuromuscular re-education;Balance training;Passive range of motion;Dry needling;Energy conservation;Splinting;Taping    PT Next Visit Plan  D/C'd at this time    Consulted and Agree with Plan of Care  Patient       Patient will benefit from skilled therapeutic intervention in order to improve the following deficits and impairments:  Abnormal gait, Decreased endurance, Decreased activity tolerance, Decreased strength, Pain, Decreased balance, Decreased mobility, Difficulty walking, Postural dysfunction, Impaired flexibility  Visit Diagnosis: Muscle weakness (generalized)  Paralytic gait  Unsteadiness on feet     Problem List Patient Active Problem List   Diagnosis Date Noted  . Vertigo 09/24/2017  . Benign paroxysmal positional vertigo   . History of stroke   . Hyperlipemia 08/23/2017  . Encounter for therapeutic drug monitoring 08/08/2017  . Long term (current) use of anticoagulants [Z79.01] 07/12/2017  . Hemiparesis and alteration of sensations as late effects of stroke ([place]) 09/07/2016  . Abnormality of gait 09/07/2016  . HTN (hypertension) 04/13/2016  . H/O mitral valve replacement with mechanical valve 10/16/2015  . Diabetes mellitus without complication ([place])   . 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  Patient goals were partially met. Patient is being discharged due to being pleased with the current functional level.  ?????     Janene Harvey, PT, DPT 09/25/18 6:39 PM   [facility] [address]  [place], [place], [address] Phone: [phone]   Fax:  [phone]  Name: NATTALIE SANTIESTEBAN MRN: [id number] Date of Birth: 1946-06-04

## 2018-10-02 ENCOUNTER — Telehealth: Payer: Self-pay

## 2018-10-02 ENCOUNTER — Ambulatory Visit (INDEPENDENT_AMBULATORY_CARE_PROVIDER_SITE_OTHER): Payer: Medicare Other

## 2018-10-02 DIAGNOSIS — I69359 Hemiplegia and hemiparesis following cerebral infarction affecting unspecified side: Secondary | ICD-10-CM

## 2018-10-02 DIAGNOSIS — Z952 Presence of prosthetic heart valve: Secondary | ICD-10-CM

## 2018-10-02 DIAGNOSIS — Z5181 Encounter for therapeutic drug level monitoring: Secondary | ICD-10-CM | POA: Diagnosis not present

## 2018-10-02 DIAGNOSIS — Z7901 Long term (current) use of anticoagulants: Secondary | ICD-10-CM

## 2018-10-02 LAB — POCT INR: INR: 3.5 — AB (ref 2.0–3.0)

## 2018-10-02 NOTE — Telephone Encounter (Signed)
Called reporting phone inr results ccd and routed to [name] who dosed and picked up the call after being placed on hold for her

## 2018-10-02 NOTE — Patient Instructions (Addendum)
  Description   Spoke with pt & instructed her to continue on same dosage 5mg  daily except 7.5mg  on Mondays, Wednesdays and Fridays. Recheck INR in 1 week.  Does weekly checks due to insurance.

## 2018-10-09 ENCOUNTER — Ambulatory Visit (INDEPENDENT_AMBULATORY_CARE_PROVIDER_SITE_OTHER): Payer: Medicare Other | Admitting: Pharmacist

## 2018-10-09 DIAGNOSIS — Z952 Presence of prosthetic heart valve: Secondary | ICD-10-CM | POA: Diagnosis not present

## 2018-10-09 DIAGNOSIS — I69359 Hemiplegia and hemiparesis following cerebral infarction affecting unspecified side: Secondary | ICD-10-CM | POA: Diagnosis not present

## 2018-10-09 DIAGNOSIS — Z7901 Long term (current) use of anticoagulants: Secondary | ICD-10-CM | POA: Diagnosis not present

## 2018-10-09 DIAGNOSIS — Z5181 Encounter for therapeutic drug level monitoring: Secondary | ICD-10-CM | POA: Diagnosis not present

## 2018-10-09 LAB — POCT INR: INR: 3.5 — AB (ref 2.0–3.0)

## 2018-10-09 NOTE — Patient Instructions (Signed)
Description   Spoke with pt & instructed her to continue on same dosage 5mg  daily except 7.5mg  on Mondays, Wednesdays and Fridays. Recheck INR in 1 week.  Does weekly checks due to insurance.

## 2018-10-11 DIAGNOSIS — E7800 Pure hypercholesterolemia, unspecified: Secondary | ICD-10-CM | POA: Diagnosis not present

## 2018-10-11 DIAGNOSIS — D692 Other nonthrombocytopenic purpura: Secondary | ICD-10-CM | POA: Diagnosis not present

## 2018-10-11 DIAGNOSIS — Z6821 Body mass index (BMI) 21.0-21.9, adult: Secondary | ICD-10-CM | POA: Diagnosis not present

## 2018-10-11 DIAGNOSIS — Z7901 Long term (current) use of anticoagulants: Secondary | ICD-10-CM | POA: Diagnosis not present

## 2018-10-11 DIAGNOSIS — I69998 Other sequelae following unspecified cerebrovascular disease: Secondary | ICD-10-CM | POA: Diagnosis not present

## 2018-10-11 DIAGNOSIS — I6789 Other cerebrovascular disease: Secondary | ICD-10-CM | POA: Diagnosis not present

## 2018-10-11 DIAGNOSIS — R2689 Other abnormalities of gait and mobility: Secondary | ICD-10-CM | POA: Diagnosis not present

## 2018-10-11 DIAGNOSIS — M810 Age-related osteoporosis without current pathological fracture: Secondary | ICD-10-CM | POA: Diagnosis not present

## 2018-10-11 DIAGNOSIS — Z952 Presence of prosthetic heart valve: Secondary | ICD-10-CM | POA: Diagnosis not present

## 2018-10-11 DIAGNOSIS — R7301 Impaired fasting glucose: Secondary | ICD-10-CM | POA: Diagnosis not present

## 2018-10-11 DIAGNOSIS — I10 Essential (primary) hypertension: Secondary | ICD-10-CM | POA: Diagnosis not present

## 2018-10-13 DIAGNOSIS — Z1231 Encounter for screening mammogram for malignant neoplasm of breast: Secondary | ICD-10-CM | POA: Diagnosis not present

## 2018-10-16 ENCOUNTER — Ambulatory Visit (INDEPENDENT_AMBULATORY_CARE_PROVIDER_SITE_OTHER): Payer: Medicare Other | Admitting: Internal Medicine

## 2018-10-16 DIAGNOSIS — Z7901 Long term (current) use of anticoagulants: Secondary | ICD-10-CM

## 2018-10-16 DIAGNOSIS — I69359 Hemiplegia and hemiparesis following cerebral infarction affecting unspecified side: Secondary | ICD-10-CM

## 2018-10-16 DIAGNOSIS — Z5181 Encounter for therapeutic drug level monitoring: Secondary | ICD-10-CM | POA: Diagnosis not present

## 2018-10-16 DIAGNOSIS — Z952 Presence of prosthetic heart valve: Secondary | ICD-10-CM

## 2018-10-16 LAB — POCT INR: INR: 3.9 — AB (ref 2.0–3.0)

## 2018-10-23 ENCOUNTER — Ambulatory Visit (INDEPENDENT_AMBULATORY_CARE_PROVIDER_SITE_OTHER): Payer: Medicare Other | Admitting: Internal Medicine

## 2018-10-23 DIAGNOSIS — Z952 Presence of prosthetic heart valve: Secondary | ICD-10-CM | POA: Diagnosis not present

## 2018-10-23 DIAGNOSIS — Z5181 Encounter for therapeutic drug level monitoring: Secondary | ICD-10-CM

## 2018-10-23 DIAGNOSIS — Z7901 Long term (current) use of anticoagulants: Secondary | ICD-10-CM

## 2018-10-23 DIAGNOSIS — I69359 Hemiplegia and hemiparesis following cerebral infarction affecting unspecified side: Secondary | ICD-10-CM

## 2018-10-23 LAB — POCT INR: INR: 2.8 (ref 2.0–3.0)

## 2018-10-30 ENCOUNTER — Ambulatory Visit (INDEPENDENT_AMBULATORY_CARE_PROVIDER_SITE_OTHER): Payer: Medicare Other

## 2018-10-30 DIAGNOSIS — I69359 Hemiplegia and hemiparesis following cerebral infarction affecting unspecified side: Secondary | ICD-10-CM | POA: Diagnosis not present

## 2018-10-30 DIAGNOSIS — Z952 Presence of prosthetic heart valve: Secondary | ICD-10-CM | POA: Diagnosis not present

## 2018-10-30 DIAGNOSIS — Z5181 Encounter for therapeutic drug level monitoring: Secondary | ICD-10-CM

## 2018-10-30 DIAGNOSIS — Z7901 Long term (current) use of anticoagulants: Secondary | ICD-10-CM | POA: Diagnosis not present

## 2018-10-30 LAB — POCT INR: INR: 2.9 (ref 2.0–3.0)

## 2018-10-30 NOTE — Patient Instructions (Signed)
Description   Spoke with pt & instructed her to take 10mg , then continue on same dosage 5mg  daily except 7.5mg  on Mondays, Wednesdays and Fridays. Recheck INR in 1 week.  Does weekly checks due to insurance.

## 2018-11-06 ENCOUNTER — Ambulatory Visit (INDEPENDENT_AMBULATORY_CARE_PROVIDER_SITE_OTHER): Payer: Medicare Other | Admitting: Internal Medicine

## 2018-11-06 DIAGNOSIS — Z952 Presence of prosthetic heart valve: Secondary | ICD-10-CM

## 2018-11-06 DIAGNOSIS — I69359 Hemiplegia and hemiparesis following cerebral infarction affecting unspecified side: Secondary | ICD-10-CM

## 2018-11-06 DIAGNOSIS — Z5181 Encounter for therapeutic drug level monitoring: Secondary | ICD-10-CM | POA: Diagnosis not present

## 2018-11-06 DIAGNOSIS — Z7901 Long term (current) use of anticoagulants: Secondary | ICD-10-CM | POA: Diagnosis not present

## 2018-11-06 LAB — POCT INR: INR: 4.2 — AB (ref 2.0–3.0)

## 2018-11-09 ENCOUNTER — Ambulatory Visit: Payer: Medicare Other | Admitting: Neurology

## 2018-11-09 ENCOUNTER — Encounter

## 2018-11-13 ENCOUNTER — Ambulatory Visit (INDEPENDENT_AMBULATORY_CARE_PROVIDER_SITE_OTHER): Payer: Medicare Other | Admitting: Cardiovascular Disease

## 2018-11-13 DIAGNOSIS — Z5181 Encounter for therapeutic drug level monitoring: Secondary | ICD-10-CM | POA: Diagnosis not present

## 2018-11-13 DIAGNOSIS — I69359 Hemiplegia and hemiparesis following cerebral infarction affecting unspecified side: Secondary | ICD-10-CM

## 2018-11-13 DIAGNOSIS — Z7901 Long term (current) use of anticoagulants: Secondary | ICD-10-CM

## 2018-11-13 DIAGNOSIS — Z952 Presence of prosthetic heart valve: Secondary | ICD-10-CM | POA: Diagnosis not present

## 2018-11-13 LAB — POCT INR: INR: 3.5 — AB (ref 2.0–3.0)

## 2018-11-20 ENCOUNTER — Ambulatory Visit (INDEPENDENT_AMBULATORY_CARE_PROVIDER_SITE_OTHER): Payer: Medicare Other

## 2018-11-20 DIAGNOSIS — Z7901 Long term (current) use of anticoagulants: Secondary | ICD-10-CM

## 2018-11-20 DIAGNOSIS — I69359 Hemiplegia and hemiparesis following cerebral infarction affecting unspecified side: Secondary | ICD-10-CM | POA: Diagnosis not present

## 2018-11-20 DIAGNOSIS — Z5181 Encounter for therapeutic drug level monitoring: Secondary | ICD-10-CM | POA: Diagnosis not present

## 2018-11-20 DIAGNOSIS — Z952 Presence of prosthetic heart valve: Secondary | ICD-10-CM

## 2018-11-20 LAB — POCT INR: INR: 2.8 (ref 2.0–3.0)

## 2018-11-20 NOTE — Patient Instructions (Signed)
Description   Spoke with pt & instructed her to take 10mg  today, then resume same dosage 5mg  daily except 7.5mg  on Mondays, Wednesdays and Fridays. Recheck INR in 1 week.  Does weekly checks due to insurance.

## 2018-11-27 ENCOUNTER — Ambulatory Visit (INDEPENDENT_AMBULATORY_CARE_PROVIDER_SITE_OTHER): Payer: Medicare Other | Admitting: Cardiology

## 2018-11-27 DIAGNOSIS — I69359 Hemiplegia and hemiparesis following cerebral infarction affecting unspecified side: Secondary | ICD-10-CM

## 2018-11-27 DIAGNOSIS — Z5181 Encounter for therapeutic drug level monitoring: Secondary | ICD-10-CM

## 2018-11-27 DIAGNOSIS — Z952 Presence of prosthetic heart valve: Secondary | ICD-10-CM | POA: Diagnosis not present

## 2018-11-27 DIAGNOSIS — Z7901 Long term (current) use of anticoagulants: Secondary | ICD-10-CM

## 2018-11-27 LAB — POCT INR: INR: 2.9 (ref 2.0–3.0)

## 2018-12-04 ENCOUNTER — Ambulatory Visit (INDEPENDENT_AMBULATORY_CARE_PROVIDER_SITE_OTHER): Payer: Medicare Other | Admitting: Internal Medicine

## 2018-12-04 DIAGNOSIS — I69359 Hemiplegia and hemiparesis following cerebral infarction affecting unspecified side: Secondary | ICD-10-CM | POA: Diagnosis not present

## 2018-12-04 DIAGNOSIS — Z7901 Long term (current) use of anticoagulants: Secondary | ICD-10-CM | POA: Diagnosis not present

## 2018-12-04 DIAGNOSIS — Z5181 Encounter for therapeutic drug level monitoring: Secondary | ICD-10-CM | POA: Diagnosis not present

## 2018-12-04 DIAGNOSIS — Z952 Presence of prosthetic heart valve: Secondary | ICD-10-CM | POA: Diagnosis not present

## 2018-12-04 LAB — POCT INR: INR: 3.3 — AB (ref 2.0–3.0)

## 2018-12-04 NOTE — Patient Instructions (Signed)
Description   Spoke with pt & instructed her to continue taking 5mg  daily except 7.5mg  on Mondays, Wednesdays and Fridays. Pt will continue eating lettuce 1 cup on Fridays instead of 2 cups.  Recheck INR in 1 week.  Does weekly checks due to insurance.

## 2018-12-11 ENCOUNTER — Ambulatory Visit (INDEPENDENT_AMBULATORY_CARE_PROVIDER_SITE_OTHER): Payer: Medicare Other | Admitting: Pharmacist

## 2018-12-11 DIAGNOSIS — Z7901 Long term (current) use of anticoagulants: Secondary | ICD-10-CM

## 2018-12-11 DIAGNOSIS — I69359 Hemiplegia and hemiparesis following cerebral infarction affecting unspecified side: Secondary | ICD-10-CM | POA: Diagnosis not present

## 2018-12-11 DIAGNOSIS — Z952 Presence of prosthetic heart valve: Secondary | ICD-10-CM | POA: Diagnosis not present

## 2018-12-11 DIAGNOSIS — Z5181 Encounter for therapeutic drug level monitoring: Secondary | ICD-10-CM

## 2018-12-11 LAB — POCT INR: INR: 2.7 (ref 2.0–3.0)

## 2018-12-18 ENCOUNTER — Ambulatory Visit (INDEPENDENT_AMBULATORY_CARE_PROVIDER_SITE_OTHER): Payer: Medicare Other | Admitting: Internal Medicine

## 2018-12-18 DIAGNOSIS — Z5181 Encounter for therapeutic drug level monitoring: Secondary | ICD-10-CM | POA: Diagnosis not present

## 2018-12-18 DIAGNOSIS — I69359 Hemiplegia and hemiparesis following cerebral infarction affecting unspecified side: Secondary | ICD-10-CM | POA: Diagnosis not present

## 2018-12-18 DIAGNOSIS — Z7901 Long term (current) use of anticoagulants: Secondary | ICD-10-CM

## 2018-12-18 DIAGNOSIS — Z952 Presence of prosthetic heart valve: Secondary | ICD-10-CM

## 2018-12-18 LAB — POCT INR: INR: 3.3 — AB (ref 2.0–3.0)

## 2018-12-25 ENCOUNTER — Ambulatory Visit (INDEPENDENT_AMBULATORY_CARE_PROVIDER_SITE_OTHER): Payer: Medicare Other | Admitting: Internal Medicine

## 2018-12-25 DIAGNOSIS — Z5181 Encounter for therapeutic drug level monitoring: Secondary | ICD-10-CM | POA: Diagnosis not present

## 2018-12-25 DIAGNOSIS — Z952 Presence of prosthetic heart valve: Secondary | ICD-10-CM | POA: Diagnosis not present

## 2018-12-25 DIAGNOSIS — I69359 Hemiplegia and hemiparesis following cerebral infarction affecting unspecified side: Secondary | ICD-10-CM

## 2018-12-25 DIAGNOSIS — Z7901 Long term (current) use of anticoagulants: Secondary | ICD-10-CM | POA: Diagnosis not present

## 2018-12-25 LAB — POCT INR: INR: 3.6 — AB (ref 2.0–3.0)

## 2019-01-01 ENCOUNTER — Ambulatory Visit (INDEPENDENT_AMBULATORY_CARE_PROVIDER_SITE_OTHER): Payer: Medicare Other | Admitting: Internal Medicine

## 2019-01-01 DIAGNOSIS — Z5181 Encounter for therapeutic drug level monitoring: Secondary | ICD-10-CM

## 2019-01-01 DIAGNOSIS — Z7901 Long term (current) use of anticoagulants: Secondary | ICD-10-CM | POA: Diagnosis not present

## 2019-01-01 DIAGNOSIS — I69359 Hemiplegia and hemiparesis following cerebral infarction affecting unspecified side: Secondary | ICD-10-CM | POA: Diagnosis not present

## 2019-01-01 DIAGNOSIS — Z952 Presence of prosthetic heart valve: Secondary | ICD-10-CM

## 2019-01-01 LAB — POCT INR: INR: 2.9 (ref 2.0–3.0)

## 2019-01-01 NOTE — Patient Instructions (Signed)
Description   Spoke with pt & instructed her to take 10mg  today, then continue taking 5mg  daily except 7.5mg  on Mondays, Wednesdays and Fridays. Pt will continue eating lettuce 1 cup on Fridays instead of 2 cups.  Recheck INR in 1 week.  Does weekly checks due to insurance.

## 2019-01-03 ENCOUNTER — Other Ambulatory Visit: Payer: Self-pay | Admitting: Cardiovascular Disease

## 2019-01-08 ENCOUNTER — Ambulatory Visit (INDEPENDENT_AMBULATORY_CARE_PROVIDER_SITE_OTHER): Payer: Medicare Other | Admitting: Cardiovascular Disease

## 2019-01-08 DIAGNOSIS — Z5181 Encounter for therapeutic drug level monitoring: Secondary | ICD-10-CM | POA: Diagnosis not present

## 2019-01-08 DIAGNOSIS — Z7901 Long term (current) use of anticoagulants: Secondary | ICD-10-CM | POA: Diagnosis not present

## 2019-01-08 DIAGNOSIS — Z952 Presence of prosthetic heart valve: Secondary | ICD-10-CM | POA: Diagnosis not present

## 2019-01-08 DIAGNOSIS — I69359 Hemiplegia and hemiparesis following cerebral infarction affecting unspecified side: Secondary | ICD-10-CM

## 2019-01-08 LAB — POCT INR: INR: 3.4 — AB (ref 2.0–3.0)

## 2019-01-15 ENCOUNTER — Ambulatory Visit (INDEPENDENT_AMBULATORY_CARE_PROVIDER_SITE_OTHER): Payer: Medicare Other | Admitting: Pharmacist

## 2019-01-15 DIAGNOSIS — Z952 Presence of prosthetic heart valve: Secondary | ICD-10-CM | POA: Diagnosis not present

## 2019-01-15 DIAGNOSIS — I69359 Hemiplegia and hemiparesis following cerebral infarction affecting unspecified side: Secondary | ICD-10-CM | POA: Diagnosis not present

## 2019-01-15 DIAGNOSIS — Z7901 Long term (current) use of anticoagulants: Secondary | ICD-10-CM

## 2019-01-15 DIAGNOSIS — Z5181 Encounter for therapeutic drug level monitoring: Secondary | ICD-10-CM | POA: Diagnosis not present

## 2019-01-15 LAB — POCT INR: INR: 3.3 — AB (ref 2.0–3.0)

## 2019-01-22 ENCOUNTER — Ambulatory Visit (INDEPENDENT_AMBULATORY_CARE_PROVIDER_SITE_OTHER): Payer: Medicare Other | Admitting: Cardiovascular Disease

## 2019-01-22 DIAGNOSIS — Z5181 Encounter for therapeutic drug level monitoring: Secondary | ICD-10-CM | POA: Diagnosis not present

## 2019-01-22 DIAGNOSIS — Z7901 Long term (current) use of anticoagulants: Secondary | ICD-10-CM | POA: Diagnosis not present

## 2019-01-22 DIAGNOSIS — I69359 Hemiplegia and hemiparesis following cerebral infarction affecting unspecified side: Secondary | ICD-10-CM | POA: Diagnosis not present

## 2019-01-22 DIAGNOSIS — Z952 Presence of prosthetic heart valve: Secondary | ICD-10-CM

## 2019-01-22 LAB — POCT INR: INR: 2.9 (ref 2.0–3.0)

## 2019-01-22 NOTE — Patient Instructions (Signed)
Description   Spoke with pt & instructed her to take 2 tablets today, then resume same dosage 5mg  daily except 7.5mg  on Mondays, Wednesdays and Fridays. Pt will continue eating lettuce 1 cup on Fridays instead of 2 cups.  Recheck INR in 1 week.  Does weekly checks due to insurance.

## 2019-01-29 ENCOUNTER — Ambulatory Visit (INDEPENDENT_AMBULATORY_CARE_PROVIDER_SITE_OTHER): Payer: Medicare Other | Admitting: Internal Medicine

## 2019-01-29 DIAGNOSIS — I69359 Hemiplegia and hemiparesis following cerebral infarction affecting unspecified side: Secondary | ICD-10-CM

## 2019-01-29 DIAGNOSIS — Z952 Presence of prosthetic heart valve: Secondary | ICD-10-CM | POA: Diagnosis not present

## 2019-01-29 DIAGNOSIS — Z7901 Long term (current) use of anticoagulants: Secondary | ICD-10-CM | POA: Diagnosis not present

## 2019-01-29 DIAGNOSIS — Z5181 Encounter for therapeutic drug level monitoring: Secondary | ICD-10-CM | POA: Diagnosis not present

## 2019-01-29 LAB — POCT INR: INR: 3.2 — AB (ref 2.0–3.0)

## 2019-02-05 ENCOUNTER — Ambulatory Visit (INDEPENDENT_AMBULATORY_CARE_PROVIDER_SITE_OTHER): Payer: Medicare Other | Admitting: Pharmacist

## 2019-02-05 DIAGNOSIS — Z952 Presence of prosthetic heart valve: Secondary | ICD-10-CM | POA: Diagnosis not present

## 2019-02-05 DIAGNOSIS — Z5181 Encounter for therapeutic drug level monitoring: Secondary | ICD-10-CM | POA: Diagnosis not present

## 2019-02-05 DIAGNOSIS — I69359 Hemiplegia and hemiparesis following cerebral infarction affecting unspecified side: Secondary | ICD-10-CM | POA: Diagnosis not present

## 2019-02-05 DIAGNOSIS — Z7901 Long term (current) use of anticoagulants: Secondary | ICD-10-CM | POA: Diagnosis not present

## 2019-02-05 LAB — POCT INR: INR: 3.4 — AB (ref 2.0–3.0)

## 2019-02-12 ENCOUNTER — Ambulatory Visit (INDEPENDENT_AMBULATORY_CARE_PROVIDER_SITE_OTHER): Payer: Medicare Other | Admitting: Interventional Cardiology

## 2019-02-12 ENCOUNTER — Telehealth: Payer: Self-pay | Admitting: Cardiovascular Disease

## 2019-02-12 DIAGNOSIS — Z952 Presence of prosthetic heart valve: Secondary | ICD-10-CM | POA: Diagnosis not present

## 2019-02-12 DIAGNOSIS — Z5181 Encounter for therapeutic drug level monitoring: Secondary | ICD-10-CM

## 2019-02-12 DIAGNOSIS — Z7901 Long term (current) use of anticoagulants: Secondary | ICD-10-CM | POA: Diagnosis not present

## 2019-02-12 DIAGNOSIS — I69359 Hemiplegia and hemiparesis following cerebral infarction affecting unspecified side: Secondary | ICD-10-CM

## 2019-02-12 LAB — POCT INR: INR: 4.6 — AB (ref 2.0–3.0)

## 2019-02-12 NOTE — Telephone Encounter (Signed)
See anticoagulation encounter

## 2019-02-12 NOTE — Telephone Encounter (Signed)
[place] call report  Today 4.6

## 2019-02-12 NOTE — Patient Instructions (Signed)
Description   Spoke with pt & instructed her to hold today's dose then continue same dosage 5mg  daily except 7.5mg  on Mondays, Wednesdays and Fridays. Pt will continue eating lettuce 1 cup on Fridays instead of 2 cups.  Recheck INR in 1 week.  Does weekly checks due to insurance.

## 2019-02-12 NOTE — Telephone Encounter (Signed)
Addressed in separate anticoag encounter from 4/6.

## 2019-02-14 ENCOUNTER — Telehealth: Payer: Self-pay | Admitting: Neurology

## 2019-02-14 ENCOUNTER — Telehealth: Payer: Self-pay

## 2019-02-14 NOTE — Telephone Encounter (Signed)
Pt has consented to a Tele Visit and for the insurance to be billed as such. Pt does not have the equipment to do a Virtual Visit.

## 2019-02-14 NOTE — Telephone Encounter (Signed)
Spoke with pt and she would prefer to see Dr. Acie Fredrickson in person opposed to virtual or phone visit. Pt rescheduled to 06/11/2019 at 10:20am.

## 2019-02-14 NOTE — Telephone Encounter (Signed)
Noted, will complete pre charting has office visit get's closer.

## 2019-02-19 ENCOUNTER — Telehealth: Payer: Self-pay | Admitting: Cardiovascular Disease

## 2019-02-19 ENCOUNTER — Ambulatory Visit (INDEPENDENT_AMBULATORY_CARE_PROVIDER_SITE_OTHER): Payer: Medicare Other | Admitting: Pharmacist

## 2019-02-19 DIAGNOSIS — I69359 Hemiplegia and hemiparesis following cerebral infarction affecting unspecified side: Secondary | ICD-10-CM | POA: Diagnosis not present

## 2019-02-19 DIAGNOSIS — Z952 Presence of prosthetic heart valve: Secondary | ICD-10-CM

## 2019-02-19 DIAGNOSIS — Z5181 Encounter for therapeutic drug level monitoring: Secondary | ICD-10-CM

## 2019-02-19 DIAGNOSIS — Z7901 Long term (current) use of anticoagulants: Secondary | ICD-10-CM | POA: Diagnosis not present

## 2019-02-19 LAB — POCT INR: INR: 4.7 — AB (ref 2.0–3.0)

## 2019-02-19 NOTE — Telephone Encounter (Signed)
error 

## 2019-02-20 ENCOUNTER — Telehealth: Payer: Self-pay | Admitting: Pharmacist

## 2019-02-20 ENCOUNTER — Telehealth: Payer: Self-pay | Admitting: Cardiovascular Disease

## 2019-02-20 DIAGNOSIS — R3915 Urgency of urination: Secondary | ICD-10-CM | POA: Diagnosis not present

## 2019-02-20 DIAGNOSIS — N3000 Acute cystitis without hematuria: Secondary | ICD-10-CM | POA: Diagnosis not present

## 2019-02-20 DIAGNOSIS — R350 Frequency of micturition: Secondary | ICD-10-CM | POA: Diagnosis not present

## 2019-02-20 NOTE — Telephone Encounter (Signed)
See anticoag encounter from 02/19/2019

## 2019-02-20 NOTE — Telephone Encounter (Signed)
INR > 4 yesterday 4/13, not dx with UTI plus cefuroxime 500mg  x 7days.  Patient to test INR 02/22/2019.

## 2019-02-20 NOTE — Telephone Encounter (Signed)
Debbie from [facility] was calling to report an out of range INR of  4.7 for this patient

## 2019-02-22 ENCOUNTER — Ambulatory Visit (INDEPENDENT_AMBULATORY_CARE_PROVIDER_SITE_OTHER): Payer: Medicare Other | Admitting: Pharmacist

## 2019-02-22 ENCOUNTER — Telehealth: Payer: Self-pay | Admitting: Cardiovascular Disease

## 2019-02-22 DIAGNOSIS — Z7901 Long term (current) use of anticoagulants: Secondary | ICD-10-CM | POA: Diagnosis not present

## 2019-02-22 DIAGNOSIS — Z5181 Encounter for therapeutic drug level monitoring: Secondary | ICD-10-CM | POA: Diagnosis not present

## 2019-02-22 DIAGNOSIS — Z952 Presence of prosthetic heart valve: Secondary | ICD-10-CM | POA: Diagnosis not present

## 2019-02-22 DIAGNOSIS — I69359 Hemiplegia and hemiparesis following cerebral infarction affecting unspecified side: Secondary | ICD-10-CM | POA: Diagnosis not present

## 2019-02-22 LAB — POCT INR: INR: 1.8 — AB (ref 2.0–3.0)

## 2019-02-22 NOTE — Telephone Encounter (Signed)
New Message    [name] is calling with home INR results She also sent a fax and is now calling to follow up and make sure you received it    INR 1.8  If you have questions, please call

## 2019-02-22 NOTE — Telephone Encounter (Signed)
Pt called this AM and made [place] aware of lab results and it was taken care of per Anticoagulation Encounter. Returned call to [facility] & spoke with Sharyn Lull, she stated she wanted to be sure we the pt's INR was 1.8; advised we did and our staff member gave pt instructions.

## 2019-02-26 ENCOUNTER — Ambulatory Visit (INDEPENDENT_AMBULATORY_CARE_PROVIDER_SITE_OTHER): Payer: Medicare Other | Admitting: Cardiovascular Disease

## 2019-02-26 DIAGNOSIS — Z7901 Long term (current) use of anticoagulants: Secondary | ICD-10-CM | POA: Diagnosis not present

## 2019-02-26 DIAGNOSIS — Z5181 Encounter for therapeutic drug level monitoring: Secondary | ICD-10-CM | POA: Diagnosis not present

## 2019-02-26 DIAGNOSIS — I69359 Hemiplegia and hemiparesis following cerebral infarction affecting unspecified side: Secondary | ICD-10-CM

## 2019-02-26 DIAGNOSIS — Z952 Presence of prosthetic heart valve: Secondary | ICD-10-CM

## 2019-02-26 LAB — POCT INR: INR: 2.5 (ref 2.0–3.0)

## 2019-02-26 NOTE — Patient Instructions (Addendum)
Description   Spoke with pt & instructed her to take 2 tablets today then continue same dosage 5mg  daily except 7.5mg  on Mondays, Wednesdays and Fridays (skip greens for Friday just this week). Pt will continue eating lettuce 1 cup on Fridays instead of 2 cups.  Recheck INR on Friday before 12n.  Does weekly checks due to insurance.

## 2019-03-02 ENCOUNTER — Ambulatory Visit (INDEPENDENT_AMBULATORY_CARE_PROVIDER_SITE_OTHER): Payer: Medicare Other | Admitting: Cardiovascular Disease

## 2019-03-02 DIAGNOSIS — Z7901 Long term (current) use of anticoagulants: Secondary | ICD-10-CM | POA: Diagnosis not present

## 2019-03-02 DIAGNOSIS — Z952 Presence of prosthetic heart valve: Secondary | ICD-10-CM | POA: Diagnosis not present

## 2019-03-02 DIAGNOSIS — I69359 Hemiplegia and hemiparesis following cerebral infarction affecting unspecified side: Secondary | ICD-10-CM | POA: Diagnosis not present

## 2019-03-02 DIAGNOSIS — Z5181 Encounter for therapeutic drug level monitoring: Secondary | ICD-10-CM | POA: Diagnosis not present

## 2019-03-02 LAB — POCT INR: INR: 3.5 — AB (ref 2.0–3.0)

## 2019-03-05 ENCOUNTER — Ambulatory Visit: Payer: Medicare Other | Admitting: Cardiovascular Disease

## 2019-03-05 DIAGNOSIS — R350 Frequency of micturition: Secondary | ICD-10-CM | POA: Diagnosis not present

## 2019-03-05 DIAGNOSIS — R8271 Bacteriuria: Secondary | ICD-10-CM | POA: Diagnosis not present

## 2019-03-06 NOTE — Telephone Encounter (Signed)
I contacted the pt and left a vm for her to return my call so we could complete pre charting for 03/07/19 telephone visit.

## 2019-03-06 NOTE — Telephone Encounter (Signed)
Patient returning you call. Please call her back [phone]

## 2019-03-06 NOTE — Telephone Encounter (Signed)
I contacted the pt and was able to update med, allergies and PMH for 03/07/19 telephone visit.

## 2019-03-07 ENCOUNTER — Other Ambulatory Visit: Payer: Self-pay

## 2019-03-07 ENCOUNTER — Ambulatory Visit (INDEPENDENT_AMBULATORY_CARE_PROVIDER_SITE_OTHER): Payer: Medicare Other | Admitting: Neurology

## 2019-03-07 ENCOUNTER — Encounter: Payer: Self-pay | Admitting: Neurology

## 2019-03-07 DIAGNOSIS — I69398 Other sequelae of cerebral infarction: Secondary | ICD-10-CM

## 2019-03-07 DIAGNOSIS — I69359 Hemiplegia and hemiparesis following cerebral infarction affecting unspecified side: Secondary | ICD-10-CM

## 2019-03-07 DIAGNOSIS — R269 Unspecified abnormalities of gait and mobility: Secondary | ICD-10-CM | POA: Diagnosis not present

## 2019-03-07 NOTE — Progress Notes (Signed)
     Virtual Visit via Video Note  I connected with [name] on 03/07/19 at 10:00 AM EDT by a video enabled telemedicine application and verified that I am speaking with the correct person using two identifiers.   I discussed the limitations of evaluation and management by telemedicine and the availability of in person appointments. The patient expressed understanding and agreed to proceed.  The patient has at home, the physician is in the office.  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  Patient does have a gait disorder associated with this with a foot drop.  She has had her AFO brace worked on, they have gotten to fit better, she now uses it and feels that it has helped her ability to get around.  She does use a cane when she is outside of the house, she will go on a walk in the neighborhood with her husband on occasion.  She has had one fall when she tripped over a belt while walking in the house.  The patient is on Coumadin therapy chronically.  The patient has not sustained any significant injury.  She does have occasional mild episodes of vertigo, she will sit down and be still for a moment and let the dizziness pass and then she can walk again.  She has not had any severe episodes of vertigo.  No other new medical issues have come up since last seen.   Observations/Objective: On the telephone evaluation, the patient is alert and cooperative.  The speech is slightly slow and deliberate, very minimally dysarthric.  No aphasia is seen.  She is answering questions appropriately.  Assessment and Plan: 1.  History of stroke, left hemiparesis  2.  Gait disorder  3.  Left foot drop  4.  Episodic vertigo  The patient is to continue with using the AFO brace when walking.  The patient is doing better with this.  She will follow-up through this office on an as-needed  basis.  If any concerns arise, she will call our office.  Follow Up Instructions: Follow-up to this office if needed.   I discussed the assessment and treatment plan with the patient. The patient was provided an opportunity to ask questions and all were answered. The patient agreed with the plan and demonstrated an understanding of the instructions.   The patient was advised to call back or seek an in-person evaluation if the symptoms worsen or if the condition fails to improve as anticipated.  I provided 15 minutes of non-face-to-face time during this encounter.   Kathrynn Ducking, MD

## 2019-03-12 ENCOUNTER — Ambulatory Visit (INDEPENDENT_AMBULATORY_CARE_PROVIDER_SITE_OTHER): Payer: Medicare Other | Admitting: Cardiovascular Disease

## 2019-03-12 DIAGNOSIS — Z952 Presence of prosthetic heart valve: Secondary | ICD-10-CM

## 2019-03-12 DIAGNOSIS — I69359 Hemiplegia and hemiparesis following cerebral infarction affecting unspecified side: Secondary | ICD-10-CM

## 2019-03-12 DIAGNOSIS — Z7901 Long term (current) use of anticoagulants: Secondary | ICD-10-CM

## 2019-03-12 DIAGNOSIS — Z5181 Encounter for therapeutic drug level monitoring: Secondary | ICD-10-CM | POA: Diagnosis not present

## 2019-03-12 LAB — POCT INR: INR: 4.5 — AB (ref 2.0–3.0)

## 2019-03-12 NOTE — Patient Instructions (Signed)
Description   Spoke with pt & instructed her to hold today's dose then continue same dosage 5mg  daily except 7.5mg  on Mondays, Wednesdays and Fridays. Pt will continue eating lettuce 1 cup on Fridays instead of 2 cups. Does weekly checks due to insurance.

## 2019-03-19 ENCOUNTER — Telehealth: Payer: Self-pay | Admitting: Cardiovascular Disease

## 2019-03-19 ENCOUNTER — Ambulatory Visit (INDEPENDENT_AMBULATORY_CARE_PROVIDER_SITE_OTHER): Payer: Medicare Other | Admitting: *Deleted

## 2019-03-19 DIAGNOSIS — Z7901 Long term (current) use of anticoagulants: Secondary | ICD-10-CM | POA: Diagnosis not present

## 2019-03-19 DIAGNOSIS — I69359 Hemiplegia and hemiparesis following cerebral infarction affecting unspecified side: Secondary | ICD-10-CM

## 2019-03-19 DIAGNOSIS — Z5181 Encounter for therapeutic drug level monitoring: Secondary | ICD-10-CM | POA: Diagnosis not present

## 2019-03-19 DIAGNOSIS — Z952 Presence of prosthetic heart valve: Secondary | ICD-10-CM

## 2019-03-19 LAB — POCT INR: INR: 3.6 — AB (ref 2.0–3.0)

## 2019-03-19 NOTE — Telephone Encounter (Signed)
Noted. Patient has been advised of dosing instructions by [facility].

## 2019-03-19 NOTE — Telephone Encounter (Signed)
[place] is calling with an out of range INR.

## 2019-03-19 NOTE — Patient Instructions (Signed)
Description   Spoke with pt & instructed her take 5 mg today and then continue with 5mg  daily except 7.5mg  on Mondays, Wednesdays and Fridays. Pt will continue eating lettuce 1 cup on Fridays instead of 2 cups. Does weekly checks due to insurance.

## 2019-03-19 NOTE — Telephone Encounter (Signed)
Samara from [facility] called to report out of range INR at 3.6 on 03/19/19. Patient seen by coumadin clinic today.

## 2019-03-26 ENCOUNTER — Telehealth: Payer: Self-pay | Admitting: Cardiovascular Disease

## 2019-03-26 ENCOUNTER — Ambulatory Visit (INDEPENDENT_AMBULATORY_CARE_PROVIDER_SITE_OTHER): Payer: Medicare Other | Admitting: Cardiology

## 2019-03-26 DIAGNOSIS — Z7901 Long term (current) use of anticoagulants: Secondary | ICD-10-CM

## 2019-03-26 DIAGNOSIS — Z952 Presence of prosthetic heart valve: Secondary | ICD-10-CM | POA: Diagnosis not present

## 2019-03-26 DIAGNOSIS — Z5181 Encounter for therapeutic drug level monitoring: Secondary | ICD-10-CM | POA: Diagnosis not present

## 2019-03-26 DIAGNOSIS — I69359 Hemiplegia and hemiparesis following cerebral infarction affecting unspecified side: Secondary | ICD-10-CM

## 2019-03-26 LAB — POCT INR: INR: 4.1 — AB (ref 2.0–3.0)

## 2019-03-26 NOTE — Telephone Encounter (Signed)
New Message       [name] is calling to report  pts INR 4.1

## 2019-03-26 NOTE — Telephone Encounter (Signed)
Spoke with pt. Please refer to anti coag visit from today.

## 2019-03-28 DIAGNOSIS — R7301 Impaired fasting glucose: Secondary | ICD-10-CM | POA: Diagnosis not present

## 2019-03-28 DIAGNOSIS — I129 Hypertensive chronic kidney disease with stage 1 through stage 4 chronic kidney disease, or unspecified chronic kidney disease: Secondary | ICD-10-CM | POA: Diagnosis not present

## 2019-03-28 DIAGNOSIS — E7800 Pure hypercholesterolemia, unspecified: Secondary | ICD-10-CM | POA: Diagnosis not present

## 2019-03-28 DIAGNOSIS — R82998 Other abnormal findings in urine: Secondary | ICD-10-CM | POA: Diagnosis not present

## 2019-03-28 DIAGNOSIS — M810 Age-related osteoporosis without current pathological fracture: Secondary | ICD-10-CM | POA: Diagnosis not present

## 2019-04-03 ENCOUNTER — Ambulatory Visit (INDEPENDENT_AMBULATORY_CARE_PROVIDER_SITE_OTHER): Payer: Medicare Other | Admitting: Pharmacist

## 2019-04-03 DIAGNOSIS — Z7901 Long term (current) use of anticoagulants: Secondary | ICD-10-CM | POA: Diagnosis not present

## 2019-04-03 DIAGNOSIS — I69359 Hemiplegia and hemiparesis following cerebral infarction affecting unspecified side: Secondary | ICD-10-CM

## 2019-04-03 DIAGNOSIS — Z952 Presence of prosthetic heart valve: Secondary | ICD-10-CM

## 2019-04-03 DIAGNOSIS — Z5181 Encounter for therapeutic drug level monitoring: Secondary | ICD-10-CM

## 2019-04-03 LAB — POCT INR: INR: 3 (ref 2.0–3.0)

## 2019-04-04 DIAGNOSIS — I679 Cerebrovascular disease, unspecified: Secondary | ICD-10-CM | POA: Diagnosis not present

## 2019-04-04 DIAGNOSIS — K219 Gastro-esophageal reflux disease without esophagitis: Secondary | ICD-10-CM | POA: Diagnosis not present

## 2019-04-04 DIAGNOSIS — G8194 Hemiplegia, unspecified affecting left nondominant side: Secondary | ICD-10-CM | POA: Diagnosis not present

## 2019-04-04 DIAGNOSIS — I69998 Other sequelae following unspecified cerebrovascular disease: Secondary | ICD-10-CM | POA: Diagnosis not present

## 2019-04-04 DIAGNOSIS — I129 Hypertensive chronic kidney disease with stage 1 through stage 4 chronic kidney disease, or unspecified chronic kidney disease: Secondary | ICD-10-CM | POA: Diagnosis not present

## 2019-04-04 DIAGNOSIS — E7800 Pure hypercholesterolemia, unspecified: Secondary | ICD-10-CM | POA: Diagnosis not present

## 2019-04-04 DIAGNOSIS — N182 Chronic kidney disease, stage 2 (mild): Secondary | ICD-10-CM | POA: Diagnosis not present

## 2019-04-04 DIAGNOSIS — M810 Age-related osteoporosis without current pathological fracture: Secondary | ICD-10-CM | POA: Diagnosis not present

## 2019-04-04 DIAGNOSIS — Z1331 Encounter for screening for depression: Secondary | ICD-10-CM | POA: Diagnosis not present

## 2019-04-04 DIAGNOSIS — Z0000 Encounter for general adult medical examination without abnormal findings: Secondary | ICD-10-CM | POA: Diagnosis not present

## 2019-04-04 DIAGNOSIS — R809 Proteinuria, unspecified: Secondary | ICD-10-CM | POA: Diagnosis not present

## 2019-04-04 DIAGNOSIS — Z952 Presence of prosthetic heart valve: Secondary | ICD-10-CM | POA: Diagnosis not present

## 2019-04-04 DIAGNOSIS — Z7901 Long term (current) use of anticoagulants: Secondary | ICD-10-CM | POA: Diagnosis not present

## 2019-04-09 ENCOUNTER — Ambulatory Visit (INDEPENDENT_AMBULATORY_CARE_PROVIDER_SITE_OTHER): Payer: Medicare Other | Admitting: Internal Medicine

## 2019-04-09 DIAGNOSIS — Z5181 Encounter for therapeutic drug level monitoring: Secondary | ICD-10-CM

## 2019-04-09 DIAGNOSIS — Z7901 Long term (current) use of anticoagulants: Secondary | ICD-10-CM

## 2019-04-09 DIAGNOSIS — Z952 Presence of prosthetic heart valve: Secondary | ICD-10-CM | POA: Diagnosis not present

## 2019-04-09 DIAGNOSIS — I69359 Hemiplegia and hemiparesis following cerebral infarction affecting unspecified side: Secondary | ICD-10-CM | POA: Diagnosis not present

## 2019-04-09 LAB — POCT INR: INR: 4.3 — AB (ref 2.0–3.0)

## 2019-04-09 NOTE — Patient Instructions (Signed)
Description   Spoke with pt & instructed her to hold today's dose then continue with 5mg  daily except 7.5mg  on Mondays, Wednesdays and Fridays. Pt will continue greens with 2 cups of lettuce on Fridays. Does weekly checks due to insurance.

## 2019-04-13 ENCOUNTER — Other Ambulatory Visit (HOSPITAL_COMMUNITY): Payer: Self-pay

## 2019-04-16 ENCOUNTER — Other Ambulatory Visit: Payer: Self-pay

## 2019-04-16 ENCOUNTER — Ambulatory Visit (HOSPITAL_COMMUNITY)
Admission: RE | Admit: 2019-04-16 | Discharge: 2019-04-16 | Disposition: A | Discharge status: Home / Self Care | Payer: Medicare Other | Source: Ambulatory Visit | Attending: Internal Medicine | Admitting: Internal Medicine

## 2019-04-16 ENCOUNTER — Ambulatory Visit (INDEPENDENT_AMBULATORY_CARE_PROVIDER_SITE_OTHER): Payer: Medicare Other | Admitting: Interventional Cardiology

## 2019-04-16 DIAGNOSIS — Z7901 Long term (current) use of anticoagulants: Secondary | ICD-10-CM | POA: Diagnosis not present

## 2019-04-16 DIAGNOSIS — Z952 Presence of prosthetic heart valve: Secondary | ICD-10-CM | POA: Diagnosis not present

## 2019-04-16 DIAGNOSIS — I69359 Hemiplegia and hemiparesis following cerebral infarction affecting unspecified side: Secondary | ICD-10-CM

## 2019-04-16 DIAGNOSIS — Z5181 Encounter for therapeutic drug level monitoring: Secondary | ICD-10-CM | POA: Diagnosis not present

## 2019-04-16 DIAGNOSIS — M810 Age-related osteoporosis without current pathological fracture: Secondary | ICD-10-CM | POA: Diagnosis not present

## 2019-04-16 LAB — POCT INR: INR: 3.4 — AB (ref 2.0–3.0)

## 2019-04-16 MED ORDER — DENOSUMAB 60 MG/ML ~~LOC~~ SOSY
60.0000 mg | PREFILLED_SYRINGE | Freq: Once | SUBCUTANEOUS | Status: AC
  Administered 2019-04-16: 60 mg via SUBCUTANEOUS

## 2019-04-16 MED ORDER — DENOSUMAB 60 MG/ML ~~LOC~~ SOSY
PREFILLED_SYRINGE | SUBCUTANEOUS | Status: AC
  Filled 2019-04-16: qty 1

## 2019-04-16 NOTE — Patient Instructions (Signed)
Description   Spoke with pt & instructed her to continue with 5mg  daily except 7.5mg  on Mondays, Wednesdays and Fridays. Pt will continue greens with 2 cups of lettuce on Fridays. Does weekly checks due to insurance.

## 2019-04-23 ENCOUNTER — Ambulatory Visit (INDEPENDENT_AMBULATORY_CARE_PROVIDER_SITE_OTHER): Payer: Medicare Other | Admitting: Cardiovascular Disease

## 2019-04-23 DIAGNOSIS — Z7901 Long term (current) use of anticoagulants: Secondary | ICD-10-CM | POA: Diagnosis not present

## 2019-04-23 DIAGNOSIS — I69359 Hemiplegia and hemiparesis following cerebral infarction affecting unspecified side: Secondary | ICD-10-CM | POA: Diagnosis not present

## 2019-04-23 DIAGNOSIS — Z952 Presence of prosthetic heart valve: Secondary | ICD-10-CM

## 2019-04-23 DIAGNOSIS — Z5181 Encounter for therapeutic drug level monitoring: Secondary | ICD-10-CM | POA: Diagnosis not present

## 2019-04-23 LAB — POCT INR: INR: 3.9 — AB (ref 2.0–3.0)

## 2019-04-23 NOTE — Patient Instructions (Signed)
Description   Spoke with pt & instructed her to take 2.5mg  today, continue with 5mg  daily except 7.5mg  on Mondays, Wednesdays and Fridays. Pt will continue greens wwith 2 cups of lettuce on Fridays. Does weekly checks due to insurance.

## 2019-04-30 ENCOUNTER — Ambulatory Visit (INDEPENDENT_AMBULATORY_CARE_PROVIDER_SITE_OTHER): Payer: Medicare Other | Admitting: Pharmacist

## 2019-04-30 DIAGNOSIS — Z952 Presence of prosthetic heart valve: Secondary | ICD-10-CM | POA: Diagnosis not present

## 2019-04-30 DIAGNOSIS — Z7901 Long term (current) use of anticoagulants: Secondary | ICD-10-CM

## 2019-04-30 DIAGNOSIS — Z5181 Encounter for therapeutic drug level monitoring: Secondary | ICD-10-CM | POA: Diagnosis not present

## 2019-04-30 DIAGNOSIS — I69359 Hemiplegia and hemiparesis following cerebral infarction affecting unspecified side: Secondary | ICD-10-CM | POA: Diagnosis not present

## 2019-04-30 LAB — POCT INR: INR: 3 (ref 2.0–3.0)

## 2019-05-07 ENCOUNTER — Ambulatory Visit (INDEPENDENT_AMBULATORY_CARE_PROVIDER_SITE_OTHER): Payer: Medicare Other | Admitting: Interventional Cardiology

## 2019-05-07 DIAGNOSIS — I69359 Hemiplegia and hemiparesis following cerebral infarction affecting unspecified side: Secondary | ICD-10-CM

## 2019-05-07 DIAGNOSIS — Z5181 Encounter for therapeutic drug level monitoring: Secondary | ICD-10-CM

## 2019-05-07 DIAGNOSIS — Z952 Presence of prosthetic heart valve: Secondary | ICD-10-CM

## 2019-05-07 DIAGNOSIS — Z7901 Long term (current) use of anticoagulants: Secondary | ICD-10-CM

## 2019-05-07 LAB — POCT INR: INR: 3.8 — AB (ref 2.0–3.0)

## 2019-05-07 NOTE — Patient Instructions (Signed)
Description   Spoke with pt and instructed to take 2.5mg  today then continue with 5mg  daily except 7.5mg  on Mondays, Wednesdays and Fridays. Pt will continue greens with 2 cups of lettuce on Fridays. Does weekly checks due to insurance.

## 2019-05-08 ENCOUNTER — Telehealth: Payer: Self-pay | Admitting: *Deleted

## 2019-05-08 NOTE — Telephone Encounter (Addendum)
Returned a call to the pt and she states she needs to let us know that her Pharmacy will not be supplying Coumadin anymore and needs to know is she can take generic, advised pt that that is fine and if her insurance allows her to have prescriptions sent elsewhere where Coumadin is she could or let us know when she starts the generic, acknowledged that we have been sending Warfarin from what I can see but I do not know what she has been dispensed. She stated she would call the Pharmacy and instruct them to call or fax [place] any info. Pt is advised that if her brand changes she may have fluctuations in her INR and we will continue to monitor her weekly as we are currently doing or more if needed, she verbalized understanding.

## 2019-05-08 NOTE — Telephone Encounter (Signed)
Patient left a message on the refill vm stating that her pharmacy no longer carries coumadin. She would like a call back at [phone] to discuss. Thanks, MI

## 2019-05-14 ENCOUNTER — Ambulatory Visit (INDEPENDENT_AMBULATORY_CARE_PROVIDER_SITE_OTHER): Payer: Medicare Other | Admitting: Internal Medicine

## 2019-05-14 DIAGNOSIS — Z952 Presence of prosthetic heart valve: Secondary | ICD-10-CM

## 2019-05-14 DIAGNOSIS — Z5181 Encounter for therapeutic drug level monitoring: Secondary | ICD-10-CM | POA: Diagnosis not present

## 2019-05-14 LAB — POCT INR: INR: 3.5 — AB (ref 2.0–3.0)

## 2019-05-14 MED ORDER — WARFARIN SODIUM 5 MG PO TABS: ORAL_TABLET | ORAL | 3 refills | Status: DC

## 2019-05-14 NOTE — Patient Instructions (Signed)
Description    Continue with 5mg  daily except 7.5mg  on Mondays, Wednesdays and Fridays. Pt will continue greens with 2 cups of lettuce on Fridays. Does weekly checks due to insurance.

## 2019-05-21 ENCOUNTER — Ambulatory Visit (INDEPENDENT_AMBULATORY_CARE_PROVIDER_SITE_OTHER): Payer: Medicare Other

## 2019-05-21 DIAGNOSIS — Z7901 Long term (current) use of anticoagulants: Secondary | ICD-10-CM | POA: Diagnosis not present

## 2019-05-21 DIAGNOSIS — I69359 Hemiplegia and hemiparesis following cerebral infarction affecting unspecified side: Secondary | ICD-10-CM | POA: Diagnosis not present

## 2019-05-21 DIAGNOSIS — Z952 Presence of prosthetic heart valve: Secondary | ICD-10-CM

## 2019-05-21 DIAGNOSIS — Z5181 Encounter for therapeutic drug level monitoring: Secondary | ICD-10-CM | POA: Diagnosis not present

## 2019-05-21 LAB — POCT INR: INR: 4.7 — AB (ref 2.0–3.0)

## 2019-05-21 NOTE — Patient Instructions (Signed)
Description   Spoke with pt and instructed her to skip today's dosage of Coumadin, then start taking 5mg  daily except 7.5mg  on Mondays and Fridays. Pt will continue greens with 2 cups of lettuce on Fridays. Pt switched from name brand Coumadin to Warfarin on 05/20/19. Does weekly checks due to insurance.

## 2019-05-25 DIAGNOSIS — E119 Type 2 diabetes mellitus without complications: Secondary | ICD-10-CM | POA: Diagnosis not present

## 2019-05-28 ENCOUNTER — Ambulatory Visit (INDEPENDENT_AMBULATORY_CARE_PROVIDER_SITE_OTHER): Payer: Medicare Other | Admitting: Cardiology

## 2019-05-28 DIAGNOSIS — Z952 Presence of prosthetic heart valve: Secondary | ICD-10-CM

## 2019-05-28 DIAGNOSIS — Z5181 Encounter for therapeutic drug level monitoring: Secondary | ICD-10-CM

## 2019-05-28 LAB — POCT INR: INR: 3.5 — AB (ref 2.0–3.0)

## 2019-05-28 NOTE — Patient Instructions (Signed)
Description   Spoke with pt and instructed her to continue taking 5mg  daily except 7.5mg  on Mondays and Fridays. Pt will continue greens with 2 cups of lettuce on Fridays. Pt switched from name brand Coumadin to Warfarin on 05/20/19. Does weekly checks due to insurance.

## 2019-06-04 ENCOUNTER — Telehealth: Payer: Self-pay | Admitting: Cardiovascular Disease

## 2019-06-04 ENCOUNTER — Ambulatory Visit (INDEPENDENT_AMBULATORY_CARE_PROVIDER_SITE_OTHER): Payer: Medicare Other | Admitting: Interventional Cardiology

## 2019-06-04 DIAGNOSIS — Z952 Presence of prosthetic heart valve: Secondary | ICD-10-CM | POA: Diagnosis not present

## 2019-06-04 DIAGNOSIS — Z7901 Long term (current) use of anticoagulants: Secondary | ICD-10-CM

## 2019-06-04 DIAGNOSIS — I69359 Hemiplegia and hemiparesis following cerebral infarction affecting unspecified side: Secondary | ICD-10-CM

## 2019-06-04 DIAGNOSIS — Z5181 Encounter for therapeutic drug level monitoring: Secondary | ICD-10-CM | POA: Diagnosis not present

## 2019-06-04 LAB — POCT INR: INR: 3.9 — AB (ref 2.0–3.0)

## 2019-06-04 NOTE — Patient Instructions (Signed)
Description   Spoke with pt and instructed her to take 2.5mg  today then continue taking 5mg  daily except 7.5mg  on Mondays and Fridays. Pt will continue greens with 2 cups of lettuce on Fridays. Pt switched from name brand Coumadin to Warfarin on 05/20/19. Does weekly checks due to insurance.

## 2019-06-04 NOTE — Telephone Encounter (Signed)
Thank you and this has been taken care of and INR was addressed. Pt has an INR range of 3.0-3.5, please refer to Anticoagulation Dosing instructions from today's Anticoagulation Encounter for details.

## 2019-06-04 NOTE — Telephone Encounter (Signed)
New Message      [name] is calling to report an out of range INR  3.9 reported today and it has been faxed

## 2019-06-05 DIAGNOSIS — L821 Other seborrheic keratosis: Secondary | ICD-10-CM | POA: Diagnosis not present

## 2019-06-05 DIAGNOSIS — Z1283 Encounter for screening for malignant neoplasm of skin: Secondary | ICD-10-CM | POA: Diagnosis not present

## 2019-06-11 ENCOUNTER — Ambulatory Visit: Payer: Medicare Other | Admitting: Cardiovascular Disease

## 2019-06-11 ENCOUNTER — Ambulatory Visit (INDEPENDENT_AMBULATORY_CARE_PROVIDER_SITE_OTHER): Payer: Medicare Other | Admitting: Pharmacist

## 2019-06-11 DIAGNOSIS — I69359 Hemiplegia and hemiparesis following cerebral infarction affecting unspecified side: Secondary | ICD-10-CM

## 2019-06-11 DIAGNOSIS — Z952 Presence of prosthetic heart valve: Secondary | ICD-10-CM

## 2019-06-11 DIAGNOSIS — Z5181 Encounter for therapeutic drug level monitoring: Secondary | ICD-10-CM

## 2019-06-11 DIAGNOSIS — Z7901 Long term (current) use of anticoagulants: Secondary | ICD-10-CM | POA: Diagnosis not present

## 2019-06-11 LAB — POCT INR: INR: 2.4 (ref 2.0–3.0)

## 2019-06-15 ENCOUNTER — Encounter: Payer: Self-pay | Admitting: Cardiology

## 2019-06-15 ENCOUNTER — Other Ambulatory Visit: Payer: Self-pay

## 2019-06-15 ENCOUNTER — Ambulatory Visit (INDEPENDENT_AMBULATORY_CARE_PROVIDER_SITE_OTHER): Payer: Medicare Other | Admitting: Cardiology

## 2019-06-15 VITALS — BP 110/68 | HR 71 | Ht 67.0 in | Wt 133.8 lb

## 2019-06-15 DIAGNOSIS — I10 Essential (primary) hypertension: Secondary | ICD-10-CM | POA: Diagnosis not present

## 2019-06-15 DIAGNOSIS — E785 Hyperlipidemia, unspecified: Secondary | ICD-10-CM

## 2019-06-15 DIAGNOSIS — Z7901 Long term (current) use of anticoagulants: Secondary | ICD-10-CM | POA: Diagnosis not present

## 2019-06-15 DIAGNOSIS — I69359 Hemiplegia and hemiparesis following cerebral infarction affecting unspecified side: Secondary | ICD-10-CM

## 2019-06-15 DIAGNOSIS — Z952 Presence of prosthetic heart valve: Secondary | ICD-10-CM

## 2019-06-15 NOTE — Progress Notes (Signed)
Cardiology Office Note:    Date:  06/15/2019   ID:  Massey Gabrielle, DOB Jun 21, 1946, MRN [id number]  PCP:  Haywood Pao, MD  Cardiologist:  Mertie Moores, MD  Referring MD: Haywood Pao, MD   Chief Complaint  Patient presents with  . Follow-up    hx of MVR    History of Present Illness:    [name] is a 73 y.o. female with a past medical history significant for hypertension, mitral valve replacement, hyperlipidemia, diabetes type 2 and multiple strokes and a TIA.  [name] is being seen today for annual follow-up. She is here with her husband.  She has had no recent falls, none in the last year. She has left-sided weakness due to stroke and walks with a cane. She walks with her husband in the evening when it is not so hot, at least 4 days per week, at least 45 minutes, about a mile. She used to go to the gym but it is closed now due to the pandemic.   She has occ mild shortness of breath, with stronger exertion, but nothing out of the ordinary for her. Occ lightheadedness if she stands up too fast, otherwise none. No orthopnea, PND. She has chronic left ankle edema that she relates to an old ankle injury in her early life.    Past Medical History:  Diagnosis Date  . Abnormality of gait 09/07/2016  . Allergy   . Diabetes mellitus without complication [facility])    Patient denies this - notes history of glucose intolerance  . 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 History of pneumonia 1997  . S/P MVR (mitral valve replacement)    Mechanical mitral valve replacement at age 57 (done in [place])  // echo 7/17: EF 55-60, normal wall motion, bileaflet mechanical mitral valve prosthesis functioning normally, mild LAE, mildly reduced RVSF, small pericardial effusion  . Stroke [facility]) 1997, 2013, 2015    Past Surgical History:  Procedure Laterality Date  . ABDOMINAL HYSTERECTOMY    . BRAIN SURGERY    . BUNIONECTOMY  1993  .  CARDIAC VALVE REPLACEMENT  1997  . TOE SURGERY  1996    Current Medications: Current Meds  Medication Sig  . amLODipine (NORVASC) 10 MG tablet Take 10 mg daily by mouth.  Marland Kitchen aspirin EC 81 MG tablet Take 81 mg by mouth daily.   Marland Kitchen atorvastatin (LIPITOR) 20 MG tablet Take 20 mg by mouth daily.  . calcium carbonate 1250 MG capsule Take 1,200 mg by mouth 2 (two) times daily with a meal.  . cholecalciferol (VITAMIN D) 1000 UNITS tablet Take 2,000 Units daily by mouth.   . denosumab (PROLIA) 60 MG/ML SOLN injection Inject 60 mg into the skin every 6 (six) months. Administer in upper arm, thigh, or abdomen  . docusate sodium (COLACE) 100 MG capsule Take 100 mg at bedtime by mouth.   Marland Kitchen omeprazole (PRILOSEC) 20 MG capsule Take 20 mg by mouth daily.  Marland Kitchen warfarin (COUMADIN) 5 MG tablet Take as directed by the coumadin clinic     Allergies:   Zoloft [sertraline]   Social History   Socioeconomic History  . Marital status: Married    Spouse name: Not on file  . Number of children: 2  . 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 Highest education level: Not on file  Occupational History  . Occupation: Retired  Scientific laboratory technician  . Financial resource strain:  Not on file  . Food insecurity    Worry: Not on file    Inability: Not on file  . Transportation needs    Medical: Not on file    Non-medical: Not on file  Tobacco Use  . Smoking status: Never Smoker  . Smokeless tobacco: Never Used  Substance and Sexual Activity  . Alcohol use: No    Alcohol/week: 0.0 standard drinks  . Drug use: No  . Sexual activity: Not on file    Comment: Married  Lifestyle  . Physical activity    Days per week: Not on file    Minutes per session: Not on file  . Stress: Not on file  Relationships  . Social Herbalist on phone: Not on file    Gets together: Not on file    Attends religious service: Not on file    Active member of club or organization: Not on file    Attends meetings of clubs or  organizations: Not on file    Relationship status: Not on file  Other Topics Concern  . Not on file  Social History Narrative   Lives at home w/ her husband   Right-handed   Caffeine: none     Family History: The patient's family history includes Cancer in her mother; Heart disease in her mother; Hyperlipidemia in her mother; Hypertension in her father and mother; Stroke in her father. There is no history of Heart attack. ROS:   Please see the history of present illness.     All other systems reviewed and are negative.  EKGs/Labs/Other Studies Reviewed:    The following studies were reviewed today:   Echocardiogram 05/12/2016 Study Conclusions - Left ventricle: The cavity size was normal. Wall thickness was normal. Systolic function was normal. The estimated ejection fraction was in the range of 55% to 60%. Wall motion was normal; there were no regional wall motion abnormalities. The study is not technically sufficient to allow evaluation of LV diastolic function. - Ventricular septum: Septal motion showed paradox. - Mitral valve: A bileaflet mechanical prosthesis was present and functioning normally. - Left atrium: The atrium was mildly dilated. - Right ventricle: Systolic function was mildly reduced. - Pericardium, extracardiac: A small pericardial effusion was identified circumferential to the heart. There was no evidence of hemodynamic compromise.  EKG:  EKG is ordered today.  The ekg ordered today demonstrates first-degree AV block, low voltage QRS, 71 bpm, abnormal R wave progression.  No significant changes from prior  Recent Labs: No results found for requested labs within last 8760 hours.   Recent Lipid Panel    Component Value Date/Time   CHOL 141 09/24/2017 0500   TRIG 92 09/24/2017 0500   HDL 60 09/24/2017 0500   CHOLHDL 2.4 09/24/2017 0500   VLDL 18 09/24/2017 0500   LDLCALC 63 09/24/2017 0500    Physical Exam:    VS:  BP 110/68    Pulse 71   Ht 5\' 7"  (1.702 m)   Wt 133 lb 12.8 oz (60.7 kg)   SpO2 96%   BMI 20.96 kg/m     Wt Readings from Last 3 Encounters:  06/15/19 133 lb 12.8 oz (60.7 kg)  03/01/18 131 lb (59.4 kg)  02/28/18 132 lb (59.9 kg)     Physical Exam  Constitutional: She is oriented to person, place, and time. She appears well-developed and well-nourished. No distress.  HENT:  Head: Normocephalic and atraumatic.  Neck: Normal range of motion. Neck supple.  No JVD present.  Cardiovascular: Normal rate, regular rhythm and intact distal pulses. Exam reveals no gallop and no friction rub.  No murmur heard. Crisp mechanical valve click  Pulmonary/Chest: Effort normal and breath sounds normal. No respiratory distress. She has no wheezes. She has no rales.  Abdominal: Soft. Bowel sounds are normal.  Musculoskeletal: Normal range of motion.     Comments: Trace left ankle edema Left leg with brace.  Neurological: She is alert and oriented to person, place, and time.  Left-sided weakness  Skin: Skin is warm and dry.  Psychiatric: She has a normal mood and affect. Her behavior is normal. Judgment and thought content normal.  Vitals reviewed.    ASSESSMENT:    1. H/O mitral valve replacement with mechanical valve   2. Long term (current) use of anticoagulants   3. Essential (primary) hypertension   4. 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 Hyperlipidemia, unspecified hyperlipidemia type    PLAN:    In order of problems listed above:  History of mitral valve replacement with mechanical valve placed at age 69.  Echo in 7/17 demonstrated normally functioning mechanical mitral valve. On chronic anticoagulation with warfarin.  No unusual bleeding.  She does note that her INRs tend to be a little up and down requiring frequent medication changes. Continue SBE prophylaxis  Hypertension, well controlled on Norvasc 10 mg daily  History of multiple strokes.  Followed by neurology, Dr. Jannifer Franklin.  Has  residual left-sided weakness/hemiparesis.  Hyperlipidemia, On atorvastatin 20 mg daily.  Managed by her PCP Dr. Osborne Casco. LDL 72 03/28/2019.   Mild carotid artery stenosis, 1-39% by US done per Dr. Jannifer Franklin.   History of frequent falls: Patient says that she has not fallen recently.  Her husband agrees that she has been doing well.   Medication Adjustments/Labs and Tests Ordered: Current medicines are reviewed at length with the patient today.  Concerns regarding medicines are outlined above. Labs and tests ordered and medication changes are outlined in the patient instructions below:  Patient Instructions  Medication Instructions:  Your physician recommends that you continue on your current medications as directed. Please refer to the Current Medication list given to you today.  If you need a refill on your cardiac medications before your next appointment, please call your pharmacy.   Lab work: None   If you have labs (blood work) drawn today and your tests are completely normal, you will receive your results only by: Marland Kitchen MyChart Message (if you have MyChart) OR . A paper copy in the mail If you have any lab test that is abnormal or we need to change your treatment, we will call you to review the results.  Testing/Procedures: None  Follow-Up: At [facility], you and your health needs are our priority.  As part of our continuing mission to provide you with exceptional heart care, we have created designated Provider Care Teams.  These Care Teams include your primary Cardiologist (physician) and Advanced Practice Providers (APPs -  Physician Assistants and Nurse Practitioners) who all work together to provide you with the care you need, when you need it. You will need a follow up appointment in:  12 months.  Please call our office 2 months in advance to schedule this appointment.  You may see Mertie Moores, MD or one of the following Advanced Practice Providers on your designated Care Team:  Richardson Dopp, PA-C [place], [place] . Daune Perch, NP  Any Other Special Instructions Will Be Listed  Below (If Applicable).   Lifestyle Modifications to Prevent and Treat Heart Disease -Recommend heart healthy/Mediterranean diet, with whole grains, fruits, vegetable, fish, lean meats, nuts, and olive oil.  -Limit salt. -Recommend moderate walking, 3-5 times/week for 30-50 minutes each session. Aim for at least 150 minutes.week. Goal should be pace of 3 miles/hours, or walking 1.5 miles in 30 minutes -Recommend avoidance of tobacco products. Avoid excess alcohol. -Keep blood pressure well controlled, ideally less than 130/80.     Signed, Daune Perch, NP  06/15/2019 5:59 PM    [place]

## 2019-06-15 NOTE — Patient Instructions (Addendum)
Medication Instructions:  Your physician recommends that you continue on your current medications as directed. Please refer to the Current Medication list given to you today.  If you need a refill on your cardiac medications before your next appointment, please call your pharmacy.   Lab work: None   If you have labs (blood work) drawn today and your tests are completely normal, you will receive your results only by: Marland Kitchen MyChart Message (if you have MyChart) OR . A paper copy in the mail If you have any lab test that is abnormal or we need to change your treatment, we will call you to review the results.  Testing/Procedures: None  Follow-Up: At [facility], you and your health needs are our priority.  As part of our continuing mission to provide you with exceptional heart care, we have created designated Provider Care Teams.  These Care Teams include your primary Cardiologist (physician) and Advanced Practice Providers (APPs -  Physician Assistants and Nurse Practitioners) who all work together to provide you with the care you need, when you need it. You will need a follow up appointment in:  12 months.  Please call our office 2 months in advance to schedule this appointment.  You may see Mertie Moores, MD or one of the following Advanced Practice Providers on your designated Care Team: Richardson Dopp, PA-C [place], [place] . Daune Perch, NP  Any Other Special Instructions Will Be Listed Below (If Applicable).   Lifestyle Modifications to Prevent and Treat Heart Disease -Recommend heart healthy/Mediterranean diet, with whole grains, fruits, vegetable, fish, lean meats, nuts, and olive oil.  -Limit salt. -Recommend moderate walking, 3-5 times/week for 30-50 minutes each session. Aim for at least 150 minutes.week. Goal should be pace of 3 miles/hours, or walking 1.5 miles in 30 minutes -Recommend avoidance of tobacco products. Avoid excess alcohol. -Keep blood pressure well controlled,  ideally less than 130/80.

## 2019-06-18 ENCOUNTER — Ambulatory Visit (INDEPENDENT_AMBULATORY_CARE_PROVIDER_SITE_OTHER): Payer: Medicare Other

## 2019-06-18 DIAGNOSIS — Z7901 Long term (current) use of anticoagulants: Secondary | ICD-10-CM

## 2019-06-18 DIAGNOSIS — Z5181 Encounter for therapeutic drug level monitoring: Secondary | ICD-10-CM

## 2019-06-18 DIAGNOSIS — Z952 Presence of prosthetic heart valve: Secondary | ICD-10-CM

## 2019-06-18 DIAGNOSIS — I69359 Hemiplegia and hemiparesis following cerebral infarction affecting unspecified side: Secondary | ICD-10-CM | POA: Diagnosis not present

## 2019-06-18 LAB — POCT INR: INR: 3.4 — AB (ref 2.0–3.0)

## 2019-06-18 NOTE — Patient Instructions (Signed)
Description   Spoke with pt advised to continue on same dosage 5mg  daily except 7.5mg  on Mondays and Fridays. Pt will continue greens with 2 cups of lettuce on Fridays. Pt switched from name brand Coumadin to Warfarin on 05/20/19. Does weekly checks due to insurance.

## 2019-06-25 ENCOUNTER — Ambulatory Visit (INDEPENDENT_AMBULATORY_CARE_PROVIDER_SITE_OTHER): Payer: Medicare Other | Admitting: Cardiovascular Disease

## 2019-06-25 DIAGNOSIS — Z7901 Long term (current) use of anticoagulants: Secondary | ICD-10-CM | POA: Diagnosis not present

## 2019-06-25 DIAGNOSIS — I69359 Hemiplegia and hemiparesis following cerebral infarction affecting unspecified side: Secondary | ICD-10-CM

## 2019-06-25 DIAGNOSIS — Z5181 Encounter for therapeutic drug level monitoring: Secondary | ICD-10-CM | POA: Diagnosis not present

## 2019-06-25 DIAGNOSIS — Z952 Presence of prosthetic heart valve: Secondary | ICD-10-CM

## 2019-06-25 LAB — POCT INR: INR: 3 (ref 2.0–3.0)

## 2019-06-26 DIAGNOSIS — G8194 Hemiplegia, unspecified affecting left nondominant side: Secondary | ICD-10-CM | POA: Diagnosis not present

## 2019-06-26 DIAGNOSIS — M545 Low back pain: Secondary | ICD-10-CM | POA: Diagnosis not present

## 2019-06-26 DIAGNOSIS — Z952 Presence of prosthetic heart valve: Secondary | ICD-10-CM | POA: Diagnosis not present

## 2019-06-26 DIAGNOSIS — I69998 Other sequelae following unspecified cerebrovascular disease: Secondary | ICD-10-CM | POA: Diagnosis not present

## 2019-06-26 DIAGNOSIS — M810 Age-related osteoporosis without current pathological fracture: Secondary | ICD-10-CM | POA: Diagnosis not present

## 2019-06-26 DIAGNOSIS — I679 Cerebrovascular disease, unspecified: Secondary | ICD-10-CM | POA: Diagnosis not present

## 2019-06-26 DIAGNOSIS — Z7901 Long term (current) use of anticoagulants: Secondary | ICD-10-CM | POA: Diagnosis not present

## 2019-06-26 DIAGNOSIS — Z23 Encounter for immunization: Secondary | ICD-10-CM | POA: Diagnosis not present

## 2019-07-02 ENCOUNTER — Ambulatory Visit (INDEPENDENT_AMBULATORY_CARE_PROVIDER_SITE_OTHER): Payer: Medicare Other | Admitting: Internal Medicine

## 2019-07-02 DIAGNOSIS — Z5181 Encounter for therapeutic drug level monitoring: Secondary | ICD-10-CM

## 2019-07-02 DIAGNOSIS — Z952 Presence of prosthetic heart valve: Secondary | ICD-10-CM

## 2019-07-02 DIAGNOSIS — Z7901 Long term (current) use of anticoagulants: Secondary | ICD-10-CM

## 2019-07-02 DIAGNOSIS — I69359 Hemiplegia and hemiparesis following cerebral infarction affecting unspecified side: Secondary | ICD-10-CM

## 2019-07-02 LAB — POCT INR: INR: 4.8 — AB (ref 2.0–3.0)

## 2019-07-03 ENCOUNTER — Encounter: Payer: Self-pay | Admitting: Cardiovascular Disease

## 2019-07-03 ENCOUNTER — Telehealth: Payer: Self-pay | Admitting: *Deleted

## 2019-07-03 NOTE — Telephone Encounter (Signed)
Pt called and stated she realized she took 5mg  of coumadin yesterday instead of holding her coumadin. Instructed pt to hold today's dose of Coumadin (07/03/19) and take 1/2 a tablet (2.5 mg) tomorrow (07/04/2019), then continue normal dose of 5mg  daily except for 7.5mg  on Monday and Friday. Pt will recheck her INR 07/09/2019.

## 2019-07-03 NOTE — Telephone Encounter (Signed)
error 

## 2019-07-09 ENCOUNTER — Ambulatory Visit (INDEPENDENT_AMBULATORY_CARE_PROVIDER_SITE_OTHER): Payer: Medicare Other | Admitting: Cardiovascular Disease

## 2019-07-09 DIAGNOSIS — Z5181 Encounter for therapeutic drug level monitoring: Secondary | ICD-10-CM

## 2019-07-09 DIAGNOSIS — Z952 Presence of prosthetic heart valve: Secondary | ICD-10-CM

## 2019-07-09 DIAGNOSIS — Z7901 Long term (current) use of anticoagulants: Secondary | ICD-10-CM

## 2019-07-09 DIAGNOSIS — I69359 Hemiplegia and hemiparesis following cerebral infarction affecting unspecified side: Secondary | ICD-10-CM | POA: Diagnosis not present

## 2019-07-09 LAB — POCT INR: INR: 2.3 (ref 2.0–3.0)

## 2019-07-09 NOTE — Progress Notes (Signed)
Patient states she has a bruise on her cheek. Has gotten a little bigger, but not drastically. Not painful. Did go to dentist the other week. Advised to keep an eye on it. If it gets much bigger have her PCP look at it

## 2019-07-16 DIAGNOSIS — Z952 Presence of prosthetic heart valve: Secondary | ICD-10-CM | POA: Diagnosis not present

## 2019-07-16 DIAGNOSIS — Z7901 Long term (current) use of anticoagulants: Secondary | ICD-10-CM | POA: Diagnosis not present

## 2019-07-16 LAB — POCT INR: INR: 3.6 — AB (ref 2.0–3.0)

## 2019-07-17 ENCOUNTER — Ambulatory Visit (INDEPENDENT_AMBULATORY_CARE_PROVIDER_SITE_OTHER): Payer: Medicare Other | Admitting: Cardiology

## 2019-07-17 DIAGNOSIS — I69359 Hemiplegia and hemiparesis following cerebral infarction affecting unspecified side: Secondary | ICD-10-CM | POA: Diagnosis not present

## 2019-07-17 DIAGNOSIS — Z7901 Long term (current) use of anticoagulants: Secondary | ICD-10-CM

## 2019-07-17 DIAGNOSIS — Z5181 Encounter for therapeutic drug level monitoring: Secondary | ICD-10-CM

## 2019-07-17 DIAGNOSIS — Z952 Presence of prosthetic heart valve: Secondary | ICD-10-CM

## 2019-07-17 NOTE — Patient Instructions (Signed)
Description   Spoke with pt and advised her to take 2.5mg  today, then continue taking 5mg  daily except 7.5mg  on Mondays and Fridays. Pt will continue greens with 2 cups of lettuce on Fridays. Pt switched from name brand Coumadin to Warfarin on 05/20/19. Does weekly checks due to insurance.

## 2019-07-23 ENCOUNTER — Ambulatory Visit (INDEPENDENT_AMBULATORY_CARE_PROVIDER_SITE_OTHER): Payer: Medicare Other | Admitting: Internal Medicine

## 2019-07-23 DIAGNOSIS — I69359 Hemiplegia and hemiparesis following cerebral infarction affecting unspecified side: Secondary | ICD-10-CM

## 2019-07-23 DIAGNOSIS — Z952 Presence of prosthetic heart valve: Secondary | ICD-10-CM

## 2019-07-23 DIAGNOSIS — Z7901 Long term (current) use of anticoagulants: Secondary | ICD-10-CM | POA: Diagnosis not present

## 2019-07-23 DIAGNOSIS — Z5181 Encounter for therapeutic drug level monitoring: Secondary | ICD-10-CM | POA: Diagnosis not present

## 2019-07-23 LAB — POCT INR: INR: 3.7 — AB (ref 2.0–3.0)

## 2019-07-24 ENCOUNTER — Telehealth: Payer: Self-pay | Admitting: Cardiovascular Disease

## 2019-07-24 NOTE — Telephone Encounter (Signed)
[employer] from [facility] is calling to report   INR 3.7 taken on the 14th

## 2019-07-24 NOTE — Telephone Encounter (Addendum)
Called [facility] back and made them aware that Pt had an Anti-coag visit 9/14 that addressed INR of 3.7

## 2019-07-30 ENCOUNTER — Ambulatory Visit (INDEPENDENT_AMBULATORY_CARE_PROVIDER_SITE_OTHER): Payer: Medicare Other | Admitting: Internal Medicine

## 2019-07-30 DIAGNOSIS — Z7901 Long term (current) use of anticoagulants: Secondary | ICD-10-CM

## 2019-07-30 DIAGNOSIS — Z952 Presence of prosthetic heart valve: Secondary | ICD-10-CM

## 2019-07-30 DIAGNOSIS — Z5181 Encounter for therapeutic drug level monitoring: Secondary | ICD-10-CM

## 2019-07-30 DIAGNOSIS — I69359 Hemiplegia and hemiparesis following cerebral infarction affecting unspecified side: Secondary | ICD-10-CM | POA: Diagnosis not present

## 2019-07-30 LAB — POCT INR: INR: 3.1 — AB (ref 2.0–3.0)

## 2019-07-30 NOTE — Patient Instructions (Signed)
Description   Spoke with pt and advised her to continue taking 5mg  daily except 7.5mg  on Mondays. Pt will continue greens with 2 cups of lettuce on Fridays. Pt switched from name brand Coumadin to Warfarin on 05/20/19. Does weekly checks due to insurance.

## 2019-08-06 ENCOUNTER — Ambulatory Visit (INDEPENDENT_AMBULATORY_CARE_PROVIDER_SITE_OTHER): Payer: Medicare Other

## 2019-08-06 DIAGNOSIS — Z7901 Long term (current) use of anticoagulants: Secondary | ICD-10-CM | POA: Diagnosis not present

## 2019-08-06 DIAGNOSIS — Z952 Presence of prosthetic heart valve: Secondary | ICD-10-CM | POA: Diagnosis not present

## 2019-08-06 DIAGNOSIS — Z5181 Encounter for therapeutic drug level monitoring: Secondary | ICD-10-CM

## 2019-08-06 DIAGNOSIS — I69359 Hemiplegia and hemiparesis following cerebral infarction affecting unspecified side: Secondary | ICD-10-CM

## 2019-08-06 LAB — POCT INR: INR: 2.2 (ref 2.0–3.0)

## 2019-08-13 ENCOUNTER — Ambulatory Visit (INDEPENDENT_AMBULATORY_CARE_PROVIDER_SITE_OTHER): Payer: Medicare Other | Admitting: Cardiovascular Disease

## 2019-08-13 DIAGNOSIS — Z952 Presence of prosthetic heart valve: Secondary | ICD-10-CM | POA: Diagnosis not present

## 2019-08-13 DIAGNOSIS — Z7901 Long term (current) use of anticoagulants: Secondary | ICD-10-CM

## 2019-08-13 DIAGNOSIS — Z5181 Encounter for therapeutic drug level monitoring: Secondary | ICD-10-CM

## 2019-08-13 DIAGNOSIS — I69359 Hemiplegia and hemiparesis following cerebral infarction affecting unspecified side: Secondary | ICD-10-CM | POA: Diagnosis not present

## 2019-08-13 LAB — POCT INR: INR: 4.4 — AB (ref 2.0–3.0)

## 2019-08-13 NOTE — Patient Instructions (Signed)
Description   Spoke with pt and instructed pt to hold today's dose then continue taking the dose you should be taking, which is 5mg  daily except 7.5mg  on Mondays. Pt will continue greens with 2 cups of lettuce on Fridays. Pt switched from name brand Coumadin to Warfarin on 05/20/19. Does weekly checks due to insurance.

## 2019-08-20 ENCOUNTER — Ambulatory Visit (INDEPENDENT_AMBULATORY_CARE_PROVIDER_SITE_OTHER): Payer: Medicare Other

## 2019-08-20 DIAGNOSIS — Z7901 Long term (current) use of anticoagulants: Secondary | ICD-10-CM | POA: Diagnosis not present

## 2019-08-20 DIAGNOSIS — Z5181 Encounter for therapeutic drug level monitoring: Secondary | ICD-10-CM | POA: Diagnosis not present

## 2019-08-20 DIAGNOSIS — I69359 Hemiplegia and hemiparesis following cerebral infarction affecting unspecified side: Secondary | ICD-10-CM | POA: Diagnosis not present

## 2019-08-20 DIAGNOSIS — Z952 Presence of prosthetic heart valve: Secondary | ICD-10-CM

## 2019-08-20 LAB — POCT INR: INR: 2.6 (ref 2.0–3.0)

## 2019-08-20 NOTE — Patient Instructions (Signed)
Description   Spoke with pt and instructed pt to take 7.5mg  today and tomorrow, then resume same dosage 5mg  daily except 7.5mg  on Mondays. Pt will continue greens with 2 cups of lettuce on Fridays. Pt switched from name brand Coumadin to Warfarin on 05/20/19. Does weekly checks due to insurance.

## 2019-08-27 ENCOUNTER — Ambulatory Visit (INDEPENDENT_AMBULATORY_CARE_PROVIDER_SITE_OTHER): Payer: Medicare Other | Admitting: Cardiology

## 2019-08-27 DIAGNOSIS — Z952 Presence of prosthetic heart valve: Secondary | ICD-10-CM

## 2019-08-27 DIAGNOSIS — I69359 Hemiplegia and hemiparesis following cerebral infarction affecting unspecified side: Secondary | ICD-10-CM | POA: Diagnosis not present

## 2019-08-27 DIAGNOSIS — Z7901 Long term (current) use of anticoagulants: Secondary | ICD-10-CM | POA: Diagnosis not present

## 2019-08-27 DIAGNOSIS — Z5181 Encounter for therapeutic drug level monitoring: Secondary | ICD-10-CM

## 2019-08-27 LAB — POCT INR: INR: 2.9 (ref 2.0–3.0)

## 2019-08-27 NOTE — Patient Instructions (Addendum)
Description   Spoke with pt and instructed pt to take 10mg  today, then resume same dosage 5mg  daily except 7.5mg  on Mondays and Fridays. Pt will continue greens with 2 cups of lettuce on Fridays. Pt switched from name brand Coumadin to Warfarin on 05/20/19. Does weekly checks due to insurance.

## 2019-09-02 ENCOUNTER — Inpatient Hospital Stay (HOSPITAL_COMMUNITY)
Admission: EM | Admit: 2019-09-02 | Discharge: 2019-09-05 | DRG: 065 | Disposition: A | Discharge status: Rehabilitation Facility | Payer: Medicare Other | Attending: Internal Medicine | Admitting: Internal Medicine

## 2019-09-02 ENCOUNTER — Other Ambulatory Visit: Payer: Self-pay

## 2019-09-02 ENCOUNTER — Emergency Department (HOSPITAL_COMMUNITY): Payer: Medicare Other

## 2019-09-02 ENCOUNTER — Other Ambulatory Visit: Payer: Self-pay | Admitting: Cardiovascular Disease

## 2019-09-02 ENCOUNTER — Encounter (HOSPITAL_COMMUNITY): Payer: Self-pay | Admitting: Emergency Medicine

## 2019-09-02 DIAGNOSIS — G8192 Hemiplegia, unspecified affecting left dominant side: Secondary | ICD-10-CM | POA: Diagnosis not present

## 2019-09-02 DIAGNOSIS — Z952 Presence of prosthetic heart valve: Secondary | ICD-10-CM

## 2019-09-02 DIAGNOSIS — Z8249 Family history of ischemic heart disease and other diseases of the circulatory system: Secondary | ICD-10-CM

## 2019-09-02 DIAGNOSIS — Z79899 Other long term (current) drug therapy: Secondary | ICD-10-CM

## 2019-09-02 DIAGNOSIS — I10 Essential (primary) hypertension: Secondary | ICD-10-CM | POA: Diagnosis present

## 2019-09-02 DIAGNOSIS — M21372 Foot drop, left foot: Secondary | ICD-10-CM | POA: Diagnosis present

## 2019-09-02 DIAGNOSIS — Z20828 Contact with and (suspected) exposure to other viral communicable diseases: Secondary | ICD-10-CM | POA: Diagnosis present

## 2019-09-02 DIAGNOSIS — I69354 Hemiplegia and hemiparesis following cerebral infarction affecting left non-dominant side: Secondary | ICD-10-CM

## 2019-09-02 DIAGNOSIS — Z7901 Long term (current) use of anticoagulants: Secondary | ICD-10-CM | POA: Diagnosis not present

## 2019-09-02 DIAGNOSIS — D696 Thrombocytopenia, unspecified: Secondary | ICD-10-CM

## 2019-09-02 DIAGNOSIS — R7303 Prediabetes: Secondary | ICD-10-CM

## 2019-09-02 DIAGNOSIS — I6930 Unspecified sequelae of cerebral infarction: Secondary | ICD-10-CM

## 2019-09-02 DIAGNOSIS — E785 Hyperlipidemia, unspecified: Secondary | ICD-10-CM | POA: Diagnosis present

## 2019-09-02 DIAGNOSIS — G8194 Hemiplegia, unspecified affecting left nondominant side: Secondary | ICD-10-CM | POA: Diagnosis not present

## 2019-09-02 DIAGNOSIS — I639 Cerebral infarction, unspecified: Secondary | ICD-10-CM | POA: Diagnosis present

## 2019-09-02 DIAGNOSIS — Z888 Allergy status to other drugs, medicaments and biological substances status: Secondary | ICD-10-CM

## 2019-09-02 DIAGNOSIS — Z7982 Long term (current) use of aspirin: Secondary | ICD-10-CM

## 2019-09-02 DIAGNOSIS — R791 Abnormal coagulation profile: Secondary | ICD-10-CM | POA: Diagnosis present

## 2019-09-02 DIAGNOSIS — R531 Weakness: Secondary | ICD-10-CM | POA: Diagnosis not present

## 2019-09-02 DIAGNOSIS — Z8349 Family history of other endocrine, nutritional and metabolic diseases: Secondary | ICD-10-CM

## 2019-09-02 DIAGNOSIS — R29702 NIHSS score 2: Secondary | ICD-10-CM | POA: Diagnosis present

## 2019-09-02 DIAGNOSIS — E119 Type 2 diabetes mellitus without complications: Secondary | ICD-10-CM | POA: Diagnosis present

## 2019-09-02 DIAGNOSIS — Z03818 Encounter for observation for suspected exposure to other biological agents ruled out: Secondary | ICD-10-CM | POA: Diagnosis not present

## 2019-09-02 DIAGNOSIS — I6389 Other cerebral infarction: Principal | ICD-10-CM | POA: Diagnosis present

## 2019-09-02 DIAGNOSIS — G9389 Other specified disorders of brain: Secondary | ICD-10-CM | POA: Diagnosis present

## 2019-09-02 DIAGNOSIS — K219 Gastro-esophageal reflux disease without esophagitis: Secondary | ICD-10-CM | POA: Diagnosis present

## 2019-09-02 DIAGNOSIS — Z8673 Personal history of transient ischemic attack (TIA), and cerebral infarction without residual deficits: Secondary | ICD-10-CM

## 2019-09-02 DIAGNOSIS — Z823 Family history of stroke: Secondary | ICD-10-CM

## 2019-09-02 DIAGNOSIS — I69398 Other sequelae of cerebral infarction: Secondary | ICD-10-CM

## 2019-09-02 LAB — CBC
HCT: 41.4 % (ref 36.0–46.0)
Hemoglobin: 13.4 g/dL (ref 12.0–15.0)
MCH: 29.5 pg (ref 26.0–34.0)
MCHC: 32.4 g/dL (ref 30.0–36.0)
MCV: 91.2 fL (ref 80.0–100.0)
Platelets: 181 10*3/uL (ref 150–400)
RBC: 4.54 MIL/uL (ref 3.87–5.11)
RDW: 13.7 % (ref 11.5–15.5)
WBC: 4.4 10*3/uL (ref 4.0–10.5)
nRBC: 0 % (ref 0.0–0.2)

## 2019-09-02 LAB — DIFFERENTIAL
Abs Immature Granulocytes: 0.01 10*3/uL (ref 0.00–0.07)
Basophils Absolute: 0 10*3/uL (ref 0.0–0.1)
Basophils Relative: 1 %
Eosinophils Absolute: 0.1 10*3/uL (ref 0.0–0.5)
Eosinophils Relative: 2 %
Immature Granulocytes: 0 %
Lymphocytes Relative: 29 %
Lymphs Abs: 1.3 10*3/uL (ref 0.7–4.0)
Monocytes Absolute: 0.3 10*3/uL (ref 0.1–1.0)
Monocytes Relative: 6 %
Neutro Abs: 2.7 10*3/uL (ref 1.7–7.7)
Neutrophils Relative %: 62 %

## 2019-09-02 LAB — COMPREHENSIVE METABOLIC PANEL
ALT: 21 U/L (ref 0–44)
AST: 22 U/L (ref 15–41)
Albumin: 4.4 g/dL (ref 3.5–5.0)
Alkaline Phosphatase: 54 U/L (ref 38–126)
Anion gap: 11 (ref 5–15)
BUN: 34 mg/dL — ABNORMAL HIGH (ref 8–23)
CO2: 23 mmol/L (ref 22–32)
Calcium: 9.3 mg/dL (ref 8.9–10.3)
Chloride: 106 mmol/L (ref 98–111)
Creatinine, Ser: 0.78 mg/dL (ref 0.44–1.00)
GFR calc Af Amer: >60 mL/min (ref >60)
GFR calc non Af Amer: >60 mL/min (ref >60)
Glucose, Bld: 135 mg/dL — ABNORMAL HIGH (ref 70–99)
Potassium: 3.6 mmol/L (ref 3.5–5.1)
Sodium: 140 mmol/L (ref 135–145)
Total Bilirubin: 0.6 mg/dL (ref 0.3–1.2)
Total Protein: 6.9 g/dL (ref 6.5–8.1)

## 2019-09-02 LAB — PROTIME-INR
INR: 2.9 — ABNORMAL HIGH (ref 0.8–1.2)
Prothrombin Time: 29.6 seconds — ABNORMAL HIGH (ref 11.4–15.2)

## 2019-09-02 LAB — APTT: aPTT: 44 seconds — ABNORMAL HIGH (ref 24–36)

## 2019-09-02 NOTE — ED Triage Notes (Signed)
Pt here for eval of left sided weakness. Has hx of strokes with baseline left sided weakness. States she woke up this morning and the left foot and left hand felt weaker. No visual or speech disturbances. No neglect. Pt takes Warfarin.

## 2019-09-03 ENCOUNTER — Other Ambulatory Visit (HOSPITAL_COMMUNITY): Payer: Medicare Other

## 2019-09-03 ENCOUNTER — Encounter (HOSPITAL_COMMUNITY): Payer: Self-pay | Admitting: *Deleted

## 2019-09-03 ENCOUNTER — Telehealth: Payer: Self-pay

## 2019-09-03 ENCOUNTER — Emergency Department (HOSPITAL_COMMUNITY): Payer: Medicare Other

## 2019-09-03 DIAGNOSIS — R791 Abnormal coagulation profile: Secondary | ICD-10-CM | POA: Diagnosis not present

## 2019-09-03 DIAGNOSIS — Z8349 Family history of other endocrine, nutritional and metabolic diseases: Secondary | ICD-10-CM | POA: Diagnosis not present

## 2019-09-03 DIAGNOSIS — K219 Gastro-esophageal reflux disease without esophagitis: Secondary | ICD-10-CM | POA: Diagnosis present

## 2019-09-03 DIAGNOSIS — Z8673 Personal history of transient ischemic attack (TIA), and cerebral infarction without residual deficits: Secondary | ICD-10-CM | POA: Diagnosis not present

## 2019-09-03 DIAGNOSIS — D696 Thrombocytopenia, unspecified: Secondary | ICD-10-CM | POA: Diagnosis present

## 2019-09-03 DIAGNOSIS — R531 Weakness: Secondary | ICD-10-CM | POA: Diagnosis not present

## 2019-09-03 DIAGNOSIS — Z7982 Long term (current) use of aspirin: Secondary | ICD-10-CM | POA: Diagnosis not present

## 2019-09-03 DIAGNOSIS — I6381 Other cerebral infarction due to occlusion or stenosis of small artery: Secondary | ICD-10-CM | POA: Diagnosis not present

## 2019-09-03 DIAGNOSIS — R7303 Prediabetes: Secondary | ICD-10-CM | POA: Diagnosis not present

## 2019-09-03 DIAGNOSIS — Z20828 Contact with and (suspected) exposure to other viral communicable diseases: Secondary | ICD-10-CM | POA: Diagnosis present

## 2019-09-03 DIAGNOSIS — Z8249 Family history of ischemic heart disease and other diseases of the circulatory system: Secondary | ICD-10-CM | POA: Diagnosis not present

## 2019-09-03 DIAGNOSIS — R29702 NIHSS score 2: Secondary | ICD-10-CM | POA: Diagnosis present

## 2019-09-03 DIAGNOSIS — I6300 Cerebral infarction due to thrombosis of unspecified precerebral artery: Secondary | ICD-10-CM | POA: Diagnosis not present

## 2019-09-03 DIAGNOSIS — M21372 Foot drop, left foot: Secondary | ICD-10-CM | POA: Diagnosis present

## 2019-09-03 DIAGNOSIS — G8194 Hemiplegia, unspecified affecting left nondominant side: Secondary | ICD-10-CM | POA: Diagnosis present

## 2019-09-03 DIAGNOSIS — Z79899 Other long term (current) drug therapy: Secondary | ICD-10-CM | POA: Diagnosis not present

## 2019-09-03 DIAGNOSIS — I361 Nonrheumatic tricuspid (valve) insufficiency: Secondary | ICD-10-CM | POA: Diagnosis not present

## 2019-09-03 DIAGNOSIS — I6389 Other cerebral infarction: Secondary | ICD-10-CM | POA: Diagnosis present

## 2019-09-03 DIAGNOSIS — E119 Type 2 diabetes mellitus without complications: Secondary | ICD-10-CM | POA: Diagnosis present

## 2019-09-03 DIAGNOSIS — Z888 Allergy status to other drugs, medicaments and biological substances status: Secondary | ICD-10-CM | POA: Diagnosis not present

## 2019-09-03 DIAGNOSIS — I69398 Other sequelae of cerebral infarction: Secondary | ICD-10-CM | POA: Diagnosis not present

## 2019-09-03 DIAGNOSIS — Z7901 Long term (current) use of anticoagulants: Secondary | ICD-10-CM | POA: Diagnosis not present

## 2019-09-03 DIAGNOSIS — Z952 Presence of prosthetic heart valve: Secondary | ICD-10-CM | POA: Diagnosis not present

## 2019-09-03 DIAGNOSIS — I639 Cerebral infarction, unspecified: Secondary | ICD-10-CM | POA: Diagnosis present

## 2019-09-03 DIAGNOSIS — G9389 Other specified disorders of brain: Secondary | ICD-10-CM | POA: Diagnosis present

## 2019-09-03 DIAGNOSIS — E785 Hyperlipidemia, unspecified: Secondary | ICD-10-CM

## 2019-09-03 DIAGNOSIS — I69354 Hemiplegia and hemiparesis following cerebral infarction affecting left non-dominant side: Secondary | ICD-10-CM | POA: Diagnosis not present

## 2019-09-03 DIAGNOSIS — I6930 Unspecified sequelae of cerebral infarction: Secondary | ICD-10-CM | POA: Diagnosis not present

## 2019-09-03 DIAGNOSIS — Z823 Family history of stroke: Secondary | ICD-10-CM | POA: Diagnosis not present

## 2019-09-03 DIAGNOSIS — I10 Essential (primary) hypertension: Secondary | ICD-10-CM | POA: Diagnosis not present

## 2019-09-03 LAB — PROTIME-INR
INR: 2.4 — ABNORMAL HIGH (ref 0.8–1.2)
Prothrombin Time: 25.9 seconds — ABNORMAL HIGH (ref 11.4–15.2)

## 2019-09-03 LAB — HEPARIN LEVEL (UNFRACTIONATED): Heparin Unfractionated: 0.16 IU/mL — ABNORMAL LOW (ref 0.30–0.70)

## 2019-09-03 LAB — SARS CORONAVIRUS 2 (TAT 6-24 HRS): SARS Coronavirus 2: NEGATIVE

## 2019-09-03 MED ORDER — ALBUTEROL SULFATE (2.5 MG/3ML) 0.083% IN NEBU: 2.5000 mg | INHALATION_SOLUTION | Freq: Four times a day (QID) | RESPIRATORY_TRACT | Status: DC | PRN

## 2019-09-03 MED ORDER — WARFARIN - PHARMACIST DOSING INPATIENT
Freq: Every day | Status: DC
  Administered 2019-09-03: 21:00:00

## 2019-09-03 MED ORDER — BIOTENE DRY MOUTH MT LIQD: 15.0000 mL | OROMUCOSAL | Status: DC | PRN

## 2019-09-03 MED ORDER — SODIUM CHLORIDE 0.9 % IV SOLN
INTRAVENOUS | Status: DC
  Administered 2019-09-03 – 2019-09-05 (×2): via INTRAVENOUS

## 2019-09-03 MED ORDER — ACETAMINOPHEN 325 MG PO TABS
650.0000 mg | ORAL_TABLET | ORAL | Status: DC | PRN
  Administered 2019-09-04 – 2019-09-05 (×2): 650 mg via ORAL
  Filled 2019-09-03 (×2): qty 2

## 2019-09-03 MED ORDER — ATORVASTATIN CALCIUM 80 MG PO TABS
80.0000 mg | ORAL_TABLET | Freq: Every day | ORAL | Status: DC
  Administered 2019-09-04: 80 mg via ORAL
  Filled 2019-09-03: qty 1

## 2019-09-03 MED ORDER — PANTOPRAZOLE SODIUM 40 MG PO TBEC
40.0000 mg | DELAYED_RELEASE_TABLET | Freq: Every day | ORAL | Status: DC
  Administered 2019-09-03 – 2019-09-04 (×2): 40 mg via ORAL
  Filled 2019-09-03 (×2): qty 1

## 2019-09-03 MED ORDER — POLYVINYL ALCOHOL 1.4 % OP SOLN
1.0000 [drp] | Freq: Two times a day (BID) | OPHTHALMIC | Status: DC | PRN
  Filled 2019-09-03: qty 15

## 2019-09-03 MED ORDER — STROKE: EARLY STAGES OF RECOVERY BOOK
Freq: Once | Status: AC
  Administered 2019-09-03: 21:00:00
  Filled 2019-09-03: qty 1

## 2019-09-03 MED ORDER — PROPYLENE GLYCOL-GLYCERIN 0.6-0.6 % OP SOLN: 1.0000 [drp] | Freq: Two times a day (BID) | OPHTHALMIC | Status: DC | PRN

## 2019-09-03 MED ORDER — ATORVASTATIN CALCIUM 10 MG PO TABS
20.0000 mg | ORAL_TABLET | Freq: Every day | ORAL | Status: DC
  Administered 2019-09-03: 20 mg via ORAL
  Filled 2019-09-03: qty 2

## 2019-09-03 MED ORDER — HEPARIN (PORCINE) 25000 UT/250ML-% IV SOLN
850.0000 [IU]/h | INTRAVENOUS | Status: DC
  Administered 2019-09-03: 700 [IU]/h via INTRAVENOUS
  Administered 2019-09-04 – 2019-09-05 (×2): 850 [IU]/h via INTRAVENOUS
  Filled 2019-09-03 (×2): qty 250

## 2019-09-03 MED ORDER — AMLODIPINE BESYLATE 10 MG PO TABS
10.0000 mg | ORAL_TABLET | Freq: Every day | ORAL | Status: DC
  Administered 2019-09-04 – 2019-09-05 (×2): 10 mg via ORAL
  Filled 2019-09-03 (×2): qty 1

## 2019-09-03 MED ORDER — ACETAMINOPHEN 650 MG RE SUPP: 650.0000 mg | RECTAL | Status: DC | PRN

## 2019-09-03 MED ORDER — ASPIRIN EC 81 MG PO TBEC
81.0000 mg | DELAYED_RELEASE_TABLET | Freq: Every day | ORAL | Status: DC
  Administered 2019-09-03 – 2019-09-05 (×3): 81 mg via ORAL
  Filled 2019-09-03 (×3): qty 1

## 2019-09-03 MED ORDER — ACETAMINOPHEN 160 MG/5ML PO SOLN: 650.0000 mg | ORAL | Status: DC | PRN

## 2019-09-03 MED ORDER — DOCUSATE SODIUM 100 MG PO CAPS
100.0000 mg | ORAL_CAPSULE | Freq: Every day | ORAL | Status: DC
  Administered 2019-09-03 – 2019-09-04 (×2): 100 mg via ORAL
  Filled 2019-09-03 (×2): qty 1

## 2019-09-03 MED ORDER — WARFARIN SODIUM 5 MG PO TABS
10.0000 mg | ORAL_TABLET | Freq: Once | ORAL | Status: AC
  Administered 2019-09-03: 10 mg via ORAL
  Filled 2019-09-03: qty 1
  Filled 2019-09-03: qty 2

## 2019-09-03 NOTE — ED Notes (Signed)
HEART HEALTHY LUNCH TRAY ORDERED 

## 2019-09-03 NOTE — ED Provider Notes (Signed)
[facility] EMERGENCY DEPARTMENT Provider Note   CSN: [id number] Arrival date & time: 09/02/19  2025    History   Chief Complaint Possible stroke  HPI [name] is a 73 y.o. female.   The history is provided by the patient.  She has history of hypertension, diabetes, hyperlipidemia, stroke with left hemiparesis and comes in with worsening weakness of her left hand and left foot.  She woke up yesterday and noted that she did not have her usual dexterity with her fingers of the left hand, and it felt like her left foot was dragging when she was walking.  Her husband noted no facial asymmetry and and her speech has been normal.  She denies any headache.  Since arriving in the ED, she feels that her hand has improved, but not quite back to its baseline.  Of note, she is on warfarin because of a mechanical mitral valve.  Past Medical History:  Diagnosis Date  . Abnormality of gait 09/07/2016  . Allergy   . Diabetes mellitus without complication [facility])    Patient denies this - notes history of glucose intolerance  . Hemiparesis and alteration of sensations as late effects of stroke ([place]) 09/07/2016  . History of pneumonia 1997  . S/P MVR (mitral valve replacement)    Mechanical mitral valve replacement at age 41 (done in [place])  // echo 7/17: EF 55-60, normal wall motion, bileaflet mechanical mitral valve prosthesis functioning normally, mild LAE, mildly reduced RVSF, small pericardial effusion  . Stroke [facility]) 1997, 2013, 2015    Patient Active Problem List   Diagnosis Date Noted  . Vertigo 09/24/2017  . Benign paroxysmal positional vertigo   . History of stroke   . Hyperlipemia 08/23/2017  . Encounter for therapeutic drug monitoring 08/08/2017  . Long term (current) use of anticoagulants [Z79.01] 07/12/2017  . Hemiparesis and alteration of sensations as late effects of stroke ([place]) 09/07/2016  . Abnormality of gait 09/07/2016  . HTN (hypertension)  04/13/2016  . H/O mitral valve replacement with mechanical valve 10/16/2015  . Diabetes mellitus without complication ([place])   . Hemiparesis (L sided - mild) due to old stroke [facility])     Past Surgical History:  Procedure Laterality Date  . ABDOMINAL HYSTERECTOMY    . BRAIN SURGERY    . BUNIONECTOMY  1993  . CARDIAC VALVE REPLACEMENT  1997  . TOE SURGERY  1996     OB History   No obstetric history on file.      Home Medications    Prior to Admission medications   Medication Sig Start Date End Date Taking? Authorizing Provider  amLODipine (NORVASC) 10 MG tablet Take 10 mg daily by mouth.    [provider]  aspirin EC 81 MG tablet Take 81 mg by mouth daily.     [provider]  atorvastatin (LIPITOR) 20 MG tablet Take 20 mg by mouth daily.    [provider]  calcium carbonate 1250 MG capsule Take 1,200 mg by mouth 2 (two) times daily with a meal.    [provider]  cholecalciferol (VITAMIN D) 1000 UNITS tablet Take 2,000 Units daily by mouth.     [provider]  denosumab (PROLIA) 60 MG/ML SOLN injection Inject 60 mg into the skin every 6 (six) months. Administer in upper arm, thigh, or abdomen    [provider]  docusate sodium (COLACE) 100 MG capsule Take 100 mg at bedtime by mouth.  [provider]  omeprazole (PRILOSEC) 20 MG capsule Take 20 mg by mouth daily.    [provider]  warfarin (COUMADIN) 5 MG tablet Take as directed by the coumadin clinic 05/14/19   Nahser, Wonda Cheng, MD    Family History Family History  Problem Relation Age of Onset  . Cancer Mother        Bone  . Heart disease Mother   . Hyperlipidemia Mother   . Hypertension Mother   . Stroke Father   . Hypertension Father   . Heart attack Neg Hx     Social History Social History   Tobacco Use  . Smoking status: Never Smoker  . Smokeless tobacco: Never Used  Substance Use Topics  . Alcohol use: No    Alcohol/week: 0.0  standard drinks  . Drug use: No     Allergies   Zoloft [sertraline]   Review of Systems Review of Systems  All other systems reviewed and are negative.    Physical Exam Updated Vital Signs BP (!) 154/89 (BP Location: Right Arm)   Pulse 78   Temp (!) 97.5 F (36.4 C) (Oral)   Resp 17   SpO2 99%   Physical Exam Vitals signs and nursing note reviewed.    73 year old female, resting comfortably and in no acute distress. Vital signs are significant for elevated blood pressure. Oxygen saturation is 99%, which is normal. Head is normocephalic and atraumatic. PERRLA, EOMI. Oropharynx is clear. Neck is nontender and supple without adenopathy or JVD.  There are no carotid bruits. Back is nontender and there is no CVA tenderness. Lungs are clear without rales, wheezes, or rhonchi. Chest is nontender. Heart has regular rate and rhythm without murmur. Abdomen is soft, flat, nontender without masses or hepatosplenomegaly and peristalsis is normoactive. Extremities have no cyanosis or edema, full range of motion is present. Skin is warm and dry without rash. Neurologic: Awake, alert, oriented x3.  Cranial nerves are intact.  Speech is normal.  There is moderate spastic left hemiparesis with strength 4/5 in left arm and left leg with normal strength in right arm and right leg.  ED Treatments / Results  Labs (all labs ordered are listed, but only abnormal results are displayed) Labs Reviewed  PROTIME-INR - Abnormal; Notable for the following components:      Result Value   Prothrombin Time 29.6 (*)    INR 2.9 (*)    All other components within normal limits  APTT - Abnormal; Notable for the following components:   aPTT 44 (*)    All other components within normal limits  COMPREHENSIVE METABOLIC PANEL - Abnormal; Notable for the following components:   Glucose, Bld 135 (*)    BUN 34 (*)    All other components within normal limits  CBC  DIFFERENTIAL    EKG EKG  Interpretation  Date/Time:  Sunday September 02 2019 20:43:42 EDT Ventricular Rate:  94 PR Interval:  236 QRS Duration: 76 QT Interval:  352 QTC Calculation: 440 R Axis:   45 Text Interpretation:  Sinus rhythm with 1st degree A-V block Cannot rule out Anterior infarct , age undetermined Abnormal ECG When compared with ECG of 09/24/2017, No significant change was found Confirmed by Delora Fuel [id number]) on 09/02/2019 11:11:15 PM   Radiology Ct Head Wo Contrast  Result Date: 09/02/2019 CLINICAL DATA:  Left-sided weakness EXAM: CT HEAD WITHOUT CONTRAST TECHNIQUE: Contiguous axial images were obtained from the base of the skull through the vertex without  intravenous contrast. COMPARISON:  09/24/2017 FINDINGS: Brain: Stable encephalomalacia is noted in the right frontal parietal region unchanged from the prior exam. Overlying craniotomy defect is seen. Scattered chronic atrophic changes are noted. Prior left thalamic infarct is again noted stable. Chronic cerebellar lacunar infarcts are noted as well. Vascular: No hyperdense vessel or unexpected calcification. Skull: Craniotomy defect is again noted as described. Sinuses/Orbits: No acute finding. Other: None. IMPRESSION: Chronic changes without acute abnormality. Electronically Signed   By: Inez Catalina M.D.   On: 09/02/2019 21:31    Procedures Procedures  Medications Ordered in ED Medications - No data to display   Initial Impression / Assessment and Plan / ED Course  I have reviewed the triage vital signs and the nursing notes.  Pertinent labs & imaging results that were available during my care of the patient were reviewed by me and considered in my medical decision making (see chart for details).  Possible new stroke.  Old records are reviewed, and she has no relevant past visits.  Last INR was on October 19 and was therapeutic.  CT of the head obtained here shows no acute process.  ECG is unchanged from baseline.  INR is therapeutic.  BUN  is mildly elevated, and not significantly changed from baseline.  She will be sent for MRI to look for evidence of new stroke.  MRI is still pending.  Case is signed out to Dr. Lita Mains.  Final Clinical Impressions(s) / ED Diagnoses   Final diagnoses:  Left hemiparesis ([place])  Anticoagulated on warfarin    ED Discharge Orders    None       Delora Fuel, MD [id number] [phone]

## 2019-09-03 NOTE — Progress Notes (Signed)
ANTICOAGULATION CONSULT NOTE - Initial Consult  Pharmacy Consult for warfarin, heparin Indication: mechanical MVR  Allergies  Allergen Reactions  . Zoloft [Sertraline] Hives, Itching and Rash    Patient Measurements: Height: 5\' 7"  (170.2 cm) Weight: 133 lb 12.8 oz (60.7 kg) IBW/kg (Calculated) : 61.6  Vital Signs: Temp: 97.8 F (36.6 C) (10/26 1955) Temp Source: Oral (10/26 1955) BP: 115/67 (10/26 1955) Pulse Rate: 72 (10/26 1955)  Labs: Recent Labs    09/02/19 2039 09/03/19 1057 09/03/19 2146  HGB 13.4  --   --   HCT 41.4  --   --   PLT 181  --   --   APTT 44*  --   --   LABPROT 29.6* 25.9*  --   INR 2.9* 2.4*  --   HEPARINUNFRC  --   --  0.16*  CREATININE 0.78  --   --     Estimated Creatinine Clearance: 60 mL/min (by C-G formula based on SCr of 0.78 mg/dL).  Assessment: 24 yof presented to the ED with worsening left sided weakness. She is on chronic warfarin for history of a mechanical valve. INR was below goal upon presentation yesterday at 2.9 and is down to 2.4. Will continue with warfarin and also bridge with IV heparin until INR is therapeutic. Baseline CBC is WNL and no bleeding noted.   Initial hep lvl low at 0.16  Goal of Therapy:  Heparin level 0.3-0.5 units/ml INR 3-3.5 Monitor platelets by anticoagulation protocol: Yes   Plan:  Increase heparin to 850 units/hr Next lvl am labs  Levester Fresh, PharmD, BCPS, BCCCP Clinical Pharmacist [phone]  Please check AMION for all [place] numbers  09/03/2019 10:12 PM

## 2019-09-03 NOTE — Telephone Encounter (Signed)
Pt called clinic left VM, returned call to pt she is currently being admitted for left sided hemiparisis.  MRI showed possible extension of previous stroke.  INR was 2.9 upon admission yesterday 09/02/19.  Pt concerned because she missed yesterday's dosage of Warfarin.  Advised pt hospital has pharmacists that will dose her Warfarin while she is an inpatient based on her daily INR blood draws.  Advised pt to call us once she is discharged so we can determine when she should check her INR again at home. Pt verbalized understanding and thanked me for returning her call.

## 2019-09-03 NOTE — ED Provider Notes (Signed)
Suspicion for acute extension of previous stroke on MRI.  Discussed with Dr. Leonel Ramsay who will see patient in the emergency department and is recommended admission.  Dr. Tamala Julian, hospitalist will see patient in the emergency department and admit.   Julianne Rice, MD 09/03/19 [phone]

## 2019-09-03 NOTE — H&P (Signed)
History and Physical    [name] [id number] DOB: 04-Mar-1946 DOA: 09/02/2019  Referring MD/NP/PA: Julianne Rice, MD PCP: Haywood Pao, MD  Patient coming from: Home  Chief Complaint: Worsening left-sided weakness  I have personally briefly reviewed patient's old medical records in [place]   HPI: [name] is a 73 y.o. 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  She presents with worsening worsening weakness of her left hand and foot starting yesterday morning when she woke up.  At baseline patient has residual left-sided weakness from previous strokes with reports of left foot drop for which she wears a orthotic shoe.  Normally only uses a cane outside of the house to ambulate.  Yesterday morning when she woke up she reported that her left leg was clumsier than normal.  While eating she reported having difficulty utilizing her left hand more so than usual.  Her husband evaluated her and noted that she had no facial droop and her speech was normal.  Patient denies having any headache, change in vision, chest pain, palpitations, shortness of breath, cough, nausea, vomiting, dysuria, or pain.  She reports that she has been taking daily 81 mg aspirin in addition to warfarin as prescribed, but notes that they have had to adjust due to subtherapeutic and supratherapeutic INRs.    ED Course: On admission into the emergency department patient was noted to have stable vital signs.  Labs significant for BUN 34, creatinine 0.78, and INR 2.9.  CT scan of the brain did not show any acute abnormalities.  However, MRI revealed suspicion for minor acute extension along the anterior margin of the old infarct. Case was discussed with Dr. Katherine Roan who we will see the patient in the emergency department.  TRH called to admit.  Covid test  pending  Review of Systems  Constitutional: Negative for fever.  HENT: Negative for ear discharge and nosebleeds.   Eyes: Negative for double vision and photophobia.  Respiratory: Negative for cough and shortness of breath.   Cardiovascular: Negative for chest pain and palpitations.  Gastrointestinal: Negative for abdominal pain, nausea and vomiting.  Genitourinary: Negative for dysuria and frequency.  Musculoskeletal: Negative for falls and myalgias.  Neurological: Positive for focal weakness. Negative for speech change and loss of consciousness.  Endo/Heme/Allergies: Bruises/bleeds easily.  Psychiatric/Behavioral: Negative for memory loss and substance abuse.    Past Medical History:  Diagnosis Date   Abnormality of gait 09/07/2016   Allergy    Diabetes mellitus without complication [facility])    Patient denies this - notes history of glucose intolerance   Hemiparesis and alteration of sensations as late effects of stroke ([place]) 09/07/2016   History of pneumonia 1997   S/P MVR (mitral valve replacement)    Mechanical mitral valve replacement at age 7 (done in [place])  // echo 7/17: EF 55-60, normal wall motion, bileaflet mechanical mitral valve prosthesis functioning normally, mild LAE, mildly reduced RVSF, small pericardial effusion   Stroke ([place]) 1997, 2013, 2015    Past Surgical History:  Procedure Laterality Date   [place]     reports that she has never smoked. She has never used smokeless tobacco. She reports that she does not drink alcohol or use drugs.  Allergies  Allergen Reactions   Zoloft [Sertraline] Hives, Itching and Rash    Family History  Problem Relation Age of Onset   Cancer Mother        Bone   Heart disease Mother    Hyperlipidemia Mother    Hypertension Mother    Stroke Father    Hypertension Father    Heart attack  Neg Hx     Prior to Admission medications   Medication Sig Start Date End Date Taking? Authorizing Provider  acetaminophen (TYLENOL) 500 MG tablet Take 1,000 mg by mouth every morning. Pt may take up to 6 tabs daily   Yes [provider]  amLODipine (NORVASC) 10 MG tablet Take 10 mg daily by mouth.   Yes [provider]  antiseptic oral rinse (BIOTENE) LIQD 15 mLs by Mouth Rinse route as needed for dry mouth.   Yes [provider]  aspirin EC 81 MG tablet Take 81 mg by mouth daily.    Yes [provider]  atorvastatin (LIPITOR) 20 MG tablet Take 20 mg by mouth daily.   Yes [provider]  calcium carbonate 1250 MG capsule Take 1,200 mg by mouth at bedtime.    Yes [provider]  cholecalciferol (VITAMIN D) 1000 UNITS tablet Take 2,000 Units daily by mouth.    Yes [provider]  docusate sodium (COLACE) 100 MG capsule Take 100 mg at bedtime by mouth.    Yes [provider]  omeprazole (PRILOSEC) 20 MG capsule Take 20 mg by mouth at bedtime.    Yes [provider]  Propylene Glycol-Glycerin (SOOTHE) 0.6-0.6 % SOLN Place 1 drop into both eyes 2 (two) times daily as needed (dry eyes).   Yes [provider]  denosumab (PROLIA) 60 MG/ML SOLN injection Inject 60 mg into the skin every 6 (six) months. Administer in upper arm, thigh, or abdomen    [provider]  warfarin (COUMADIN) 5 MG tablet TAKE BY MOUTH AS DIRECTED BY THE COUMADIN CLINIC FOR 30 DAYS. 09/03/19   Nahser, Wonda Cheng, MD    Physical Exam:  Constitutional: Early female in no acute distress in NAD, calm, comfortable Vitals:   09/02/19 2032 09/03/19 0004 09/03/19 0510  BP: (!) 146/89 128/76 (!) 154/89  Pulse: 99 62 78  Resp: 19 16 17   Temp: 98.1 F (36.7 C) 98.7 F (37.1 C) (!) 97.5 F (36.4 C)  TempSrc:  Oral Oral  SpO2: 96% 96% 99%   Eyes: PERRL, lids and conjunctivae normal ENMT: Mucous membranes are moist. Posterior  pharynx clear of any exudate or lesions.   Neck: normal, supple, no masses, no thyromegaly Respiratory: clear to auscultation bilaterally, no wheezing, no crackles. Normal respiratory effort. No accessory muscle use.  Cardiovascular: Regular rate and rhythm, positive click . No extremity edema. 2+ pedal pulses. No carotid bruits.  Abdomen: no tenderness, no masses palpated. No hepatosplenomegaly. Bowel sounds positive.  Musculoskeletal: no clubbing / cyanosis. No joint deformity upper and lower extremities. Good ROM, no contractures. Normal muscle tone.  Skin: no rashes, lesions, ulcers. No induration Neurologic: CN 2-12 grossly intact. Sensation intact, DTR normal. Strength 5/5 on right upper and lower extremity.  Strength 4/5 on left upper and lower extremity. Psychiatric: Normal judgment and insight. Alert and oriented x 3. Normal mood.     Labs on Admission: I have personally reviewed following labs and imaging studies  CBC: Recent Labs  Lab 09/02/19 2039  WBC 4.4  NEUTROABS 2.7  HGB 13.4  HCT 41.4  MCV 91.2  PLT 0000000   Basic Metabolic Panel: Recent Labs  Lab 09/02/19 2039  NA 140  K 3.6  CL 106  CO2 23  GLUCOSE 135*  BUN 34*  CREATININE 0.78  CALCIUM 9.3   GFR: CrCl cannot be calculated (Unknown ideal weight.). Liver Function Tests: Recent Labs  Lab 09/02/19 2039  AST 22  ALT 21  ALKPHOS 54  BILITOT 0.6  PROT 6.9  ALBUMIN 4.4   No results for input(s): LIPASE, AMYLASE in the last 168 hours. No results for input(s): AMMONIA in the last 168 hours. Coagulation Profile: Recent Labs  Lab 09/02/19 2039  INR 2.9*   Cardiac Enzymes: No results for input(s): CKTOTAL, CKMB, CKMBINDEX, TROPONINI in the last 168 hours. BNP (last 3 results) No results for input(s): PROBNP in the last 8760 hours. HbA1C: No results for input(s): HGBA1C in the last 72 hours. CBG: No results for input(s): GLUCAP in the last 168 hours. Lipid Profile: No results for input(s):  CHOL, HDL, LDLCALC, TRIG, CHOLHDL, LDLDIRECT in the last 72 hours. Thyroid Function Tests: No results for input(s): TSH, T4TOTAL, FREET4, T3FREE, THYROIDAB in the last 72 hours. Anemia Panel: No results for input(s): VITAMINB12, FOLATE, FERRITIN, TIBC, IRON, RETICCTPCT in the last 72 hours. Urine analysis:    Component Value Date/Time   COLORURINE COLORLESS (A) 09/24/2017 0240   APPEARANCEUR CLEAR 09/24/2017 0240   LABSPEC 1.006 09/24/2017 0240   PHURINE 7.0 09/24/2017 0240   GLUCOSEU NEGATIVE 09/24/2017 0240   HGBUR SMALL (A) 09/24/2017 0240   BILIRUBINUR NEGATIVE 09/24/2017 0240   KETONESUR NEGATIVE 09/24/2017 0240   PROTEINUR NEGATIVE 09/24/2017 0240   NITRITE NEGATIVE 09/24/2017 0240   LEUKOCYTESUR NEGATIVE 09/24/2017 0240   Sepsis Labs: No results found for this or any previous visit (from the past 240 hour(s)).   Radiological Exams on Admission: Ct Head Wo Contrast  Result Date: 09/02/2019 CLINICAL DATA:  Left-sided weakness EXAM: CT HEAD WITHOUT CONTRAST TECHNIQUE: Contiguous axial images were obtained from the base of the skull through the vertex without intravenous contrast. COMPARISON:  09/24/2017 FINDINGS: Brain: Stable encephalomalacia is noted in the right frontal parietal region unchanged from the prior exam. Overlying craniotomy defect is seen. Scattered chronic atrophic changes are noted. Prior left thalamic infarct is again noted stable. Chronic cerebellar lacunar infarcts are noted as well. Vascular: No hyperdense vessel or unexpected calcification. Skull: Craniotomy defect is again noted as described. Sinuses/Orbits: No acute finding. Other: None. IMPRESSION: Chronic changes without acute abnormality. Electronically Signed   By: Inez Catalina M.D.   On: 09/02/2019 21:31   Mr Brain Wo Contrast  Result Date: 09/03/2019 CLINICAL DATA:  Hypertension, diabetes and hyperlipidemia. Stroke with left hemiparesis. Worsening weakness on the left. Symptoms began yesterday. EXAM:  MRI HEAD WITHOUT CONTRAST TECHNIQUE: Multiplanar, multiecho pulse sequences of the brain and surrounding structures were obtained without intravenous contrast. COMPARISON:  Head CT 09/02/2019.  MRI 09/24/2017. FINDINGS: Brain: There has been extensive old hemorrhagic infarction in the right parietal cortical and subcortical brain. Overlying old craniotomy. The area is affected by encephalomalacia and gliosis. There are some chronic foci of tissue restricted diffusion that I do not think relate to acute infarction. Along the anterior margin, there is a more linear focus of restricted diffusion within the adjacent white matter and a small focus in the adjacent cortex which could represent a tiny extension of the infarction. No other acute infarction suspected. Elsewhere, there chronic small-vessel ischemic changes of the pons. There are numerous old small vessel  cerebellar infarctions. Cerebral hemispheres show old small vessel infarctions affecting the thalami and chronic small-vessel ischemic changes the deep white matter without evidence of other large vessel territory insult. No sign recent hemorrhage, hydrocephalus or extra-axial fluid collection. I suspect that there is a tiny meningioma along the left side of the posterior falx in the posterior parietal region, no more than 2-3 mm in thickness. This is unchanged since 2018. Vascular: Major vessels at the base of the brain show flow. Skull and upper cervical spine: Otherwise negative Sinuses/Orbits: Clear/normal Other: None IMPRESSION: Old hemorrhagic infarction in the right parietal cortical and subcortical brain which has progressed to atrophy, encephalomalacia and gliosis. Suspicion of a minor acute extension along the anterior margin of the old infarction affecting the deep white matter and a small focus adjacent cortical brain. Extensive old ischemic changes elsewhere throughout the brain as outlined above. 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 Electronically Signed   By: Nelson Chimes M.D.   On: 09/03/2019 08:34    EKG: Independently reviewed.  Sinus rhythm at 94 beats minute with first-degree AV heart block.  Assessment/Plan CVA, history of CVAs with residual deficit: Patient presents with acute worsening of her left-sided weakness.  MRI revealing suspicion for minor acute extension of old infarction.  Neurology have been consulted.  Question if fluctuating INR is a contributing factor.  -Admit to telemetry bed -Stroke order set initiated -Neuro checks -Check CTA of head and neck -PT/OT to evaluate and treat -Check echocardiogram -Check Hemoglobin A1c and lipid panel in a.m. -Continue ASA  -Appreciate neurology consultative services, will follow-up further recommendations   S/p mechanical mitral valve replacement, subtherapeutic INR: Acute. INR subtherapeutic at 2.9 on 10/26.  Goal range 3-3.5. Review of records does show some intermittent supratherapeutic and subtherapeutic levels. -Heparin drip to bridge and Coumadin per pharmacy   Essential hypertension: Patient's blood pressures are maintained. -Allow for permissive hypertension today -Restart amlodipine tomorrow  Hyperlipidemia: At home patient on atorvastatin 20 mg daily. -Increase atorvastatin to 80 mg nightly  GERD -Continue pharmacy substitution for Prilosec  Covid test: Follow-up pending test  DVT prophylaxis: Coumadin Code Status: Full Family Communication: Discussed plan of care with the patient family present at bedside Disposition Plan: To be determined Consults called: Neurology Admission status: Inpatient  Norval Morton MD [facility] Pager [phone]   If 7PM-7AM, please contact night-coverage [email or web address] Password [email or web address]  09/03/2019, 9:17 AM

## 2019-09-03 NOTE — Consult Note (Addendum)
Neurology Consultation  Reason for Consult: Increased hemiparesis on the left side Referring Physician: Dr. Tamala Julian  History is obtained from: Patient  HPI: [name] is a 73 y.o. 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  Patient states that she went to bed at approximately 2245 hrs. on Saturday.  She woke up on Sunday and noted that when she put her shoe on the left foot she felt as though there was a ball sensation under her foot.  Later that day she noticed that her dexterity of her left hand was significantly decreased.  It was at this time that patient was nervous that she might of had a stroke.  Patient also noted she forgot to take a dose of her Coumadin however her INR upon admission was still 2.9.  Currently she feels stronger in her left arm/dexterity but still not back to normal.   Chart review patient is seen Dr. Jannifer Franklin as an outpatient secondary to past strokes.  At that time patient had full stroke work-up.  That was back in 1996  Work up that has been done: MRI brain   LKW: 10 03/02/2019 tpa given?: no, out of window Premorbid modified Rankin scale (mRS): 1 NIH stroke score 2    Past Medical History:  Diagnosis Date  . Abnormality of gait 09/07/2016  . Allergy   . Diabetes mellitus without complication [facility])    Patient denies this - notes history of glucose intolerance  . Hemiparesis and alteration of sensations as late effects of stroke ([place]) 09/07/2016  . History of pneumonia 1997  . S/P MVR (mitral valve replacement)    Mechanical mitral valve replacement at age 60 (done in [place])  // echo 7/17: EF 55-60, normal wall motion, bileaflet mechanical mitral valve prosthesis functioning normally, mild LAE, mildly reduced RVSF, small pericardial effusion  . Stroke [facility]) 1997, 2013, 2015      Family History  Problem Relation Age of Onset  . Cancer Mother        Bone   . Heart disease Mother   . Hyperlipidemia Mother   . Hypertension Mother   . Stroke Father   . Hypertension Father   . Heart attack Neg Hx    Social History:   reports that she has never smoked. She has never used smokeless tobacco. She reports that she does not drink alcohol or use drugs.  Medications  Current Facility-Administered Medications:  .   stroke: mapping our early stages of recovery book, , Does not apply, Once, Smith, Rondell A, MD .  0.9 %  sodium chloride infusion, , Intravenous, Continuous, Smith, Rondell A, MD .  acetaminophen (TYLENOL) tablet 650 mg, 650 mg, Oral, Q4H PRN **OR** acetaminophen (TYLENOL) 160 MG/5ML solution 650 mg, 650 mg, Per Tube, Q4H PRN **OR** acetaminophen (TYLENOL) suppository 650 mg, 650 mg, Rectal, Q4H PRN, Smith, Rondell A, MD .  albuterol (PROVENTIL) (2.5 MG/3ML) 0.083% nebulizer solution 2.5 mg, 2.5 mg, Nebulization, Q6H PRN, Tamala Julian, Rondell A, MD .  Derrill Memo ON 09/04/2019] amLODipine (NORVASC) tablet 10 mg, 10 mg, Oral, Daily, Smith, Rondell A, MD .  antiseptic oral rinse (BIOTENE) solution 15 mL, 15 mL, Mouth Rinse, PRN, Smith, Rondell A, MD .  aspirin EC tablet 81 mg, 81 mg, Oral, Daily, Smith, Rondell A, MD, 81 mg at 09/03/19 1056 .  atorvastatin (LIPITOR) tablet 20 mg, 20 mg, Oral, Daily, Smith, Rondell A, MD, 20 mg at 09/03/19 1056 .  docusate sodium (COLACE) capsule 100 mg, 100 mg, Oral, QHS, Smith, Rondell A, MD .  pantoprazole (PROTONIX) EC tablet 40 mg, 40 mg, Oral, QHS, Smith, Rondell A, MD .  polyvinyl alcohol (LIQUIFILM TEARS) 1.4 % ophthalmic solution 1 drop, 1 drop, Both Eyes, BID PRN, Norval Morton, MD .  Warfarin - Pharmacist Dosing Inpatient, , Does not apply, q1800, Rumbarger, Valeda Malm, RPH  Current Outpatient Medications:  .  acetaminophen (TYLENOL) 500 MG tablet, Take 1,000 mg by mouth every morning. Pt may take up to 6 tabs daily, Disp: , Rfl:  .  amLODipine (NORVASC) 10 MG tablet, Take 10 mg daily by mouth., Disp: , Rfl:   .  antiseptic oral rinse (BIOTENE) LIQD, 15 mLs by Mouth Rinse route as needed for dry mouth., Disp: , Rfl:  .  aspirin EC 81 MG tablet, Take 81 mg by mouth daily. , Disp: , Rfl:  .  atorvastatin (LIPITOR) 20 MG tablet, Take 20 mg by mouth daily., Disp: , Rfl:  .  calcium carbonate 1250 MG capsule, Take 1,200 mg by mouth at bedtime. , Disp: , Rfl:  .  cholecalciferol (VITAMIN D) 1000 UNITS tablet, Take 2,000 Units daily by mouth. , Disp: , Rfl:  .  docusate sodium (COLACE) 100 MG capsule, Take 100 mg at bedtime by mouth. , Disp: , Rfl:  .  omeprazole (PRILOSEC) 20 MG capsule, Take 20 mg by mouth at bedtime. , Disp: , Rfl:  .  Propylene Glycol-Glycerin (SOOTHE) 0.6-0.6 % SOLN, Place 1 drop into both eyes 2 (two) times daily as needed (dry eyes)., Disp: , Rfl:  .  denosumab (PROLIA) 60 MG/ML SOLN injection, Inject 60 mg into the skin every 6 (six) months. Administer in upper arm, thigh, or abdomen, Disp: , Rfl:  .  warfarin (COUMADIN) 5 MG tablet, TAKE BY MOUTH AS DIRECTED BY THE COUMADIN CLINIC FOR 30 DAYS., Disp: 45 tablet, Rfl: 3   Exam: Current vital signs: BP (!) 154/89 (BP Location: Right Arm)   Pulse 78   Temp (!) 97.5 F (36.4 C) (Oral)   Resp 17   SpO2 99%  Vital signs in last 24 hours: Temp:  [97.5 F (36.4 C)-98.7 F (37.1 C)] 97.5 F (36.4 C) (10/26 0510) Pulse Rate:  [62-99] 78 (10/26 0510) Resp:  [16-19] 17 (10/26 0510) BP: (128-154)/(76-89) 154/89 (10/26 0510) SpO2:  [96 %-99 %] 99 % (10/26 0930) FiO2 (%):  [21 %] 21 % (10/26 0930)  ROS: A 14 point ROS was performed and is negative except as noted in the HPI.   Physical Exam  Constitutional: Appears well-developed and well-nourished.  Psych: Affect appropriate to situation Eyes: No scleral injection HENT: No OP obstrucion Head: Normocephalic.  Cardiovascular: Normal rate and regular rhythm.  Respiratory: Effort normal, non-labored breathing GI: Soft.  No distension. There is no tenderness.  Skin:  WDI  Neuro: Mental Status: Patient is awake, alert, oriented to person, place, month, year, and situation. Patient is able to give a clear and coherent history. No signs of aphasia or neglect Cranial Nerves: II: Visual Fields are full.  III,IV, VI: EOMI without ptosis or diploplia. Pupils equal, round and reactive to light V: Facial sensation is symmetric to temperature VII: Facial movement is symmetric.  Possible slight decreased nasolabial fold on the left VIII: hearing is intact to voice X: Palat elevates symmetrically XI: Shoulder shrug is asymmetrical on the left XII: tongue is midline without atrophy or fasciculations.  Motor: 5/5 on the right, left arm  is held in 45 degree contraction along with weakness and shoulder abduction, bicep flexion and tricep extension.  Left leg has 5/5 strength.  Bobbing but no drift with left leg Sensory: Sensation is symmetric to light touch and temperature in the arms and legs. Deep Tendon Reflexes: 2+ in upper and lower extremities on the right, 3+ in upper extremity on the left, 3+ knee jerk on the left 2+ ankle jerk on the left with sustained clonus of the ankle.  Cross adduction with knee jerk bilaterally. Plantars: Upgoing on the left downgoing on the right Cerebellar: Dysmetria on the left arm with finger-to-nose and ataxia heel-to-shin on the left  Labs I have reviewed labs in epic and the results pertinent to this consultation are:   CBC    Component Value Date/Time   WBC 4.4 09/02/2019 2039   RBC 4.54 09/02/2019 2039   HGB 13.4 09/02/2019 2039   HCT 41.4 09/02/2019 2039   PLT 181 09/02/2019 2039   MCV 91.2 09/02/2019 2039   MCH 29.5 09/02/2019 2039   MCHC 32.4 09/02/2019 2039   RDW 13.7 09/02/2019 2039   LYMPHSABS 1.3 09/02/2019 2039   MONOABS 0.3 09/02/2019 2039   EOSABS 0.1 09/02/2019 2039   BASOSABS 0.0 09/02/2019 2039    CMP     Component Value Date/Time   NA 140 09/02/2019 2039   K 3.6 09/02/2019 2039   CL 106  09/02/2019 2039   CO2 23 09/02/2019 2039   GLUCOSE 135 (H) 09/02/2019 2039   BUN 34 (H) 09/02/2019 2039   CREATININE 0.78 09/02/2019 2039   CALCIUM 9.3 09/02/2019 2039   PROT 6.9 09/02/2019 2039   ALBUMIN 4.4 09/02/2019 2039   AST 22 09/02/2019 2039   ALT 21 09/02/2019 2039   ALKPHOS 54 09/02/2019 2039   BILITOT 0.6 09/02/2019 2039   GFRNONAA >60 09/02/2019 2039   GFRAA >60 09/02/2019 2039    Lipid Panel     Component Value Date/Time   CHOL 141 09/24/2017 0500   TRIG 92 09/24/2017 0500   HDL 60 09/24/2017 0500   CHOLHDL 2.4 09/24/2017 0500   VLDL 18 09/24/2017 0500   LDLCALC 63 09/24/2017 0500     Imaging I have reviewed the images obtained:  CT-scan of the brain-chronic changes without acute abnormality  MRI examination of the brain-old hemorrhagic infarction in the right parietal cortical and subcortical brain which has progressed to atrophy, encephalomalacia and gliosis.  Minor acute extension along the anterior margin of old infarction affecting deep white matter and a small focus adjacent cortical brain.    Etta Quill PA-C [facility] [phone]  M-F  (9:00 am- 5:00 PM)  09/03/2019, 11:33 AM   I have seen and evaluated the patient. I have reviewed the above note and made appropriate changes.   Assessment:  This is a 73 year old female with multiple strokes in the past.  Patient presents to the hospital with increased hemiparesis of the left arm and leg.  MRI does confirm acute extension along the anterior margin of old infarction.  The exact etiology is less clear to me.  It is possible she had a transient hypoperfusion event versus a new embolic event to the same distribution as the previous stroke.  The strokes are relatively small, and given the high risk of mechanical heart valve, I think it is reasonable to continue anticoagulation.  Recommend #CTA of head and neck #Transthoracic Echo,  #Continue Coumadin at this time as these are punctate  infarcts, continue aspirin 81  mg.  #Start or continue Atorvastatin 80 mg/other high intensity statin # BP goal: permissive HTN upto 220/120 mmHg # HBAIC and Lipid profile # Telemetry monitoring # Frequent neuro checks # NPO until passes stroke swallow screen # please page stroke NP  Or  PA  Or MD from 8am -4 pm  as this patient from this time will be  followed by the stroke.   You can look them up on [email or web address]  Password [email or web address]  Roland Rack, MD [facility] [phone]  If 7pm- 7am, please page neurology on call as listed in [place].

## 2019-09-03 NOTE — Progress Notes (Signed)
ANTICOAGULATION CONSULT NOTE - Initial Consult  Pharmacy Consult for warfarin, heparin Indication: mechanical MVR  Allergies  Allergen Reactions  . Zoloft [Sertraline] Hives, Itching and Rash    Patient Measurements: Height: 5\' 7"  (170.2 cm) Weight: 133 lb 12.8 oz (60.7 kg) IBW/kg (Calculated) : 61.6  Vital Signs: Temp: 97.5 F (36.4 C) (10/26 0510) Temp Source: Oral (10/26 0510) BP: 154/89 (10/26 0510) Pulse Rate: 78 (10/26 0510)  Labs: Recent Labs    09/02/19 2039  HGB 13.4  HCT 41.4  PLT 181  APTT 44*  LABPROT 29.6*  INR 2.9*  CREATININE 0.78    CrCl cannot be calculated (Unknown ideal weight.).   Medical History: Past Medical History:  Diagnosis Date  . Abnormality of gait 09/07/2016  . Allergy   . Diabetes mellitus without complication [facility])    Patient denies this - notes history of glucose intolerance  . Hemiparesis and alteration of sensations as late effects of stroke ([place]) 09/07/2016  . History of pneumonia 1997  . S/P MVR (mitral valve replacement)    Mechanical mitral valve replacement at age 35 (done in [place])  // echo 7/17: EF 55-60, normal wall motion, bileaflet mechanical mitral valve prosthesis functioning normally, mild LAE, mildly reduced RVSF, small pericardial effusion  . Stroke [facility]) 1997, 2013, 2015   Assessment: 73 yof presented to the ED with worsening left sided weakness. She is on chronic warfarin for history of a mechanical valve. INR was below goal upon presentation yesterday at 2.9 and is down to 2.4. Will continue with warfarin and also bridge with IV heparin until INR is therapeutic. Baseline CBC is WNL and no bleeding noted.   Goal of Therapy:  Heparin level 0.3-0.5 units/ml INR 3-3.5 Monitor platelets by anticoagulation protocol: Yes   Plan:  Warfarin 10mg  PO x 1 tonight Heparin gtt 700 units/hr Check an 8 hr heparin level Daily INR, heparin and CBC  Nayquan Evinger, Rande Lawman 09/03/2019,9:42 AM

## 2019-09-03 NOTE — ED Notes (Signed)
Patient transferred to MRI 

## 2019-09-04 ENCOUNTER — Inpatient Hospital Stay (HOSPITAL_COMMUNITY): Payer: Medicare Other

## 2019-09-04 ENCOUNTER — Encounter (HOSPITAL_COMMUNITY): Payer: Self-pay | Admitting: Radiology

## 2019-09-04 DIAGNOSIS — I361 Nonrheumatic tricuspid (valve) insufficiency: Secondary | ICD-10-CM

## 2019-09-04 LAB — CBC
HCT: 35.9 % — ABNORMAL LOW (ref 36.0–46.0)
Hemoglobin: 11.5 g/dL — ABNORMAL LOW (ref 12.0–15.0)
MCH: 29 pg (ref 26.0–34.0)
MCHC: 32 g/dL (ref 30.0–36.0)
MCV: 90.7 fL (ref 80.0–100.0)
Platelets: 156 10*3/uL (ref 150–400)
RBC: 3.96 MIL/uL (ref 3.87–5.11)
RDW: 13.7 % (ref 11.5–15.5)
WBC: 4.1 10*3/uL (ref 4.0–10.5)
nRBC: 0 % (ref 0.0–0.2)

## 2019-09-04 LAB — HEPARIN LEVEL (UNFRACTIONATED)
Heparin Unfractionated: 0.38 IU/mL (ref 0.30–0.70)
Heparin Unfractionated: 0.38 IU/mL (ref 0.30–0.70)

## 2019-09-04 LAB — HEMOGLOBIN A1C
Hgb A1c MFr Bld: 6.1 % — ABNORMAL HIGH (ref 4.8–5.6)
Mean Plasma Glucose: 128.37 mg/dL

## 2019-09-04 LAB — BASIC METABOLIC PANEL
Anion gap: 6 (ref 5–15)
BUN: 30 mg/dL — ABNORMAL HIGH (ref 8–23)
CO2: 24 mmol/L (ref 22–32)
Calcium: 8.5 mg/dL — ABNORMAL LOW (ref 8.9–10.3)
Chloride: 112 mmol/L — ABNORMAL HIGH (ref 98–111)
Creatinine, Ser: 0.59 mg/dL (ref 0.44–1.00)
GFR calc Af Amer: >60 mL/min (ref >60)
GFR calc non Af Amer: >60 mL/min (ref >60)
Glucose, Bld: 131 mg/dL — ABNORMAL HIGH (ref 70–99)
Potassium: 4 mmol/L (ref 3.5–5.1)
Sodium: 142 mmol/L (ref 135–145)

## 2019-09-04 LAB — ECHOCARDIOGRAM COMPLETE
Height: 67 in
Weight: 2140.8 oz

## 2019-09-04 LAB — LIPID PANEL
Cholesterol: 125 mg/dL (ref 0–200)
HDL: 54 mg/dL (ref >40)
LDL Cholesterol: 65 mg/dL (ref 0–99)
Total CHOL/HDL Ratio: 2.3 RATIO
Triglycerides: 29 mg/dL (ref <150)
VLDL: 6 mg/dL (ref 0–40)

## 2019-09-04 LAB — PROTIME-INR
INR: 2.1 — ABNORMAL HIGH (ref 0.8–1.2)
Prothrombin Time: 23.4 seconds — ABNORMAL HIGH (ref 11.4–15.2)

## 2019-09-04 MED ORDER — IOHEXOL 350 MG/ML SOLN
75.0000 mL | Freq: Once | INTRAVENOUS | Status: AC | PRN
  Administered 2019-09-04: 75 mL via INTRAVENOUS

## 2019-09-04 MED ORDER — ATORVASTATIN CALCIUM 10 MG PO TABS
20.0000 mg | ORAL_TABLET | Freq: Every day | ORAL | Status: DC
  Administered 2019-09-05: 20 mg via ORAL
  Filled 2019-09-04: qty 2

## 2019-09-04 MED ORDER — WARFARIN SODIUM 7.5 MG PO TABS
7.5000 mg | ORAL_TABLET | Freq: Once | ORAL | Status: AC
  Administered 2019-09-04: 7.5 mg via ORAL
  Filled 2019-09-04: qty 1

## 2019-09-04 NOTE — Procedures (Signed)
Echo attempted. Patient receiving care from NT. Will attempt again.

## 2019-09-04 NOTE — Progress Notes (Signed)
PROGRESS NOTE  [name] [id number] DOB: Jun 03, 1946 DOA: 09/02/2019 PCP: Haywood Pao, MD  HPI/Recap of past 24 hours:  [name] is a 73 y.o. 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  She presents with worsening worsening weakness of her left hand and foot starting yesterday morning when she woke up.  At baseline patient has residual left-sided weakness from previous strokes with reports of left foot drop for which she wears a orthotic shoe.  Normally only uses a cane outside of the house to ambulate.  Yesterday morning when she woke up she reported that her left leg was clumsier than normal.  While eating she reported having difficulty utilizing her left hand more so than usual.  Her husband evaluated her and noted that she had no facial droop and her speech was normal.  Patient denies having any headache, change in vision, chest pain, palpitations, shortness of breath, cough, nausea, vomiting, dysuria, or pain.  She reports that she has been taking daily 81 mg aspirin in addition to warfarin as prescribed, but notes that they have had to adjust due to subtherapeutic and supratherapeutic INRs.    ED Course: On admission into the emergency department patient was noted to have stable vital signs.  Labs significant for BUN 34, creatinine 0.78, and INR 2.9.  CT scan of the brain did not show any acute abnormalities.  However, MRI revealed suspicion for minor acute extension along the anterior margin of the old infarct. Case was discussed with Dr. Katherine Roan who we will see the patient in the emergency department.  TRH called to admit.  Covid test pending  09/04/19: Patient was seen and examined at her bedside this morning.  No acute events overnight.  Persistent left sided weakness.  Seen by PT OT with recommendation for inpatient  rehab.  CIR consulted for possible inpatient rehab admission.   Assessment/Plan: Principal Problem:   CVA (cerebral vascular accident) ([place]) Active Problems:   H/O mitral valve replacement with mechanical valve   HTN (hypertension)   Hyperlipemia   History of stroke  Acute extension of old infarction CVA, history of CVAs with residual deficit: Patient presents with acute worsening of her left-sided weakness.  MRI revealing suspicion for minor acute extension of old infarction.  Neurology have been consulted.  Question if fluctuating INR is a contributing factor.  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 On aspirin 81 mg daily and Coumadin -Continue recommendations per stroke team   S/p mechanical mitral valve replacement, subtherapeutic INR: Acute. INR subtherapeutic at 2.9 on 10/26.  Goal range 3-3.5. Review of records does show some intermittent supratherapeutic and subtherapeutic levels. -Heparin drip to bridge and Coumadin per pharmacy  INR 2.1, subtherapeutic Pharmacy managing Coumadin dosing  Essential hypertension:  Blood pressure is currently at goal Continue amlodipine 10 mg daily Continue to monitor vital signs  Hyperlipidemia: At home patient on atorvastatin 20 mg daily. -Increase atorvastatin to 80 mg nightly  GERD -Continue pharmacy substitution for Prilosec  Ambulatory dysfunction post CVA PT OT recommended CIR CIR consulted for possible admission to inpatient rehab. Continue fall precautions    DVT prophylaxis: Coumadin bridged with heparin drip.  Code Status: Full Family Communication:  None at bedside.   Disposition Plan:  Possible discharge to inpatient rehab when neurology signs off, bed  is available with insurance authorization.  Consults called: Neurology/stroke team   Objective: Vitals:   09/03/19 2351 09/04/19 0411 09/04/19 0720 09/04/19 0831  BP: (!) 116/55 124/73 138/73 126/76  Pulse: 71 60  65  Resp: 12 12 16 16   Temp: 98.6 F (37 C) 98 F (36.7 C) 97.7 F (36.5 C) (!) 97.5 F (36.4 C)  TempSrc: Oral Oral Oral Oral  SpO2: 95% 94% 97% 97%  Weight:      Height:        Intake/Output Summary (Last 24 hours) at 09/04/2019 1519 Last data filed at 09/04/2019 0800 Gross per 24 hour  Intake 911.78 ml  Output --  Net 911.78 ml   Filed Weights   09/03/19 1300  Weight: 60.7 kg    Exam:   General: 73 y.o. year-old female well developed well nourished in no acute distress.  Alert and oriented x4.  Cardiovascular: Regular rate and rhythm with no rubs or gallops.  No thyromegaly or JVD noted.    Respiratory: Clear to auscultation with no wheezes or rales. Good inspiratory effort.  Abdomen: Soft nontender nondistended with normal bowel sounds x4 quadrants.  Musculoskeletal: No lower extremity edema. 2/4 pulses in all 4 extremities.  3 out of 5 strength in left upper and left lower extremity.  Psychiatry: Mood is appropriate for condition and setting   Data Reviewed: CBC: Recent Labs  Lab 09/02/19 2039 09/04/19 0359  WBC 4.4 4.1  NEUTROABS 2.7  --   HGB 13.4 11.5*  HCT 41.4 35.9*  MCV 91.2 90.7  PLT 181 [id number]   Basic Metabolic Panel: Recent Labs  Lab 09/02/19 2039 09/04/19 0359  NA 140 142  K 3.6 4.0  CL 106 112*  CO2 23 24  GLUCOSE 135* 131*  BUN 34* 30*  CREATININE 0.78 0.59  CALCIUM 9.3 8.5*   GFR: Estimated Creatinine Clearance: 60 mL/min (by C-G formula based on SCr of 0.59 mg/dL). Liver Function Tests: Recent Labs  Lab 09/02/19 2039  AST 22  ALT 21  ALKPHOS 54  BILITOT 0.6  PROT 6.9  ALBUMIN 4.4   No results for input(s): LIPASE, AMYLASE in the last 168 hours. No results for input(s): AMMONIA in the last 168  hours. Coagulation Profile: Recent Labs  Lab 09/02/19 2039 09/03/19 1057 09/04/19 0359  INR 2.9* 2.4* 2.1*   Cardiac Enzymes: No results for input(s): CKTOTAL, CKMB, CKMBINDEX, TROPONINI in the last 168 hours. BNP (last 3 results) No results for input(s): PROBNP in the last 8760 hours. HbA1C: Recent Labs    09/04/19 0359  HGBA1C 6.1*   CBG: No results for input(s): GLUCAP in the last 168 hours. Lipid Profile: Recent Labs    09/04/19 0359  CHOL 125  HDL 54  LDLCALC 65  TRIG 29  CHOLHDL 2.3   Thyroid Function Tests: No results for input(s): TSH, T4TOTAL, FREET4, T3FREE, THYROIDAB in the last 72 hours. Anemia Panel: No results for input(s): VITAMINB12, FOLATE, FERRITIN, TIBC, IRON, RETICCTPCT in the last 72 hours. Urine analysis:    Component Value Date/Time   COLORURINE COLORLESS (A) 09/24/2017 0240   APPEARANCEUR CLEAR 09/24/2017 0240   LABSPEC 1.006 09/24/2017 0240   PHURINE 7.0 09/24/2017 0240   GLUCOSEU NEGATIVE 09/24/2017 0240   HGBUR SMALL (A) 09/24/2017 0240   BILIRUBINUR NEGATIVE 09/24/2017 0240   KETONESUR NEGATIVE 09/24/2017 0240  PROTEINUR NEGATIVE 09/24/2017 0240   NITRITE NEGATIVE 09/24/2017 0240   LEUKOCYTESUR NEGATIVE 09/24/2017 0240   Sepsis Labs: @LABRCNTIP (procalcitonin:4,lacticidven:4)  ) Recent Results (from the past 240 hour(s))  SARS CORONAVIRUS 2 (TAT 6-24 HRS) Nasopharyngeal Nasopharyngeal Swab     Status: None   Collection Time: 09/03/19 12:00 PM   Specimen: Nasopharyngeal Swab  Result Value Ref Range Status   SARS Coronavirus 2 NEGATIVE NEGATIVE Final    Comment: (NOTE) SARS-CoV-2 target nucleic acids are NOT DETECTED. The SARS-CoV-2 RNA is generally detectable in upper and lower respiratory specimens during the acute phase of infection. Negative results do not preclude SARS-CoV-2 infection, do not rule out co-infections with other pathogens, and should not be used as the sole basis for treatment or other patient management  decisions. Negative results must be combined with clinical observations, patient history, and epidemiological information. The expected result is Negative. Fact Sheet for Patients: SugarRoll.be Fact Sheet for Healthcare Providers: https://www.woods-mathews.com/ This test is not yet approved or cleared by the [place] FDA and  has been authorized for detection and/or diagnosis of SARS-CoV-2 by FDA under an Emergency Use Authorization (EUA). This EUA will remain  in effect (meaning this test can be used) for the duration of the COVID-19 declaration under Section 56 4(b)(1) of the Act, 21 U.S.C. section 360bbb-3(b)(1), unless the authorization is terminated or revoked sooner. Performed at [facility], [place] [address], [place], [place] [address]       Studies: Ct Angio Head W Or Wo Contrast  Result Date: 09/04/2019 CLINICAL DATA:  Stroke workup.  Left-sided weakness EXAM: CT ANGIOGRAPHY HEAD AND NECK TECHNIQUE: Multidetector CT imaging of the head and neck was performed using the standard protocol during bolus administration of intravenous contrast. Multiplanar CT image reconstructions and MIPs were obtained to evaluate the vascular anatomy. Carotid stenosis measurements (when applicable) are obtained utilizing NASCET criteria, using the distal internal carotid diameter as the denominator. CONTRAST:  42mL OMNIPAQUE IOHEXOL 350 MG/ML SOLN COMPARISON:  09/24/2017 FINDINGS: CTA NECK FINDINGS Aortic arch: Atherosclerotic plaque.  Two vessel branching. Right carotid system: Vessels are smooth and widely patent. Limited atheromatous changes. Left carotid system: Vessels are smooth and widely patent. Minimal atherosclerotic plaque at the ICA bulb. Vertebral arteries: Proximal subclavian atherosclerosis without stenosis or ulceration. Slight left vertebral artery dominance. Both vertebral arteries are smooth and widely patent to the dura. Skeleton:  Degenerative disc narrowing and facet spurring. Remote right parietal craniotomy, unremarkable. Other neck: No incidental mass or swelling. Upper chest: Negative Review of the MIP images confirms the above findings CTA HEAD FINDINGS Anterior circulation: Vessels are smooth and widely patent. No branch occlusion, aneurysm, or beading. Posterior circulation: Vertebrobasilar arteries are smooth and widely patent. Unremarkable cerebellar branches. No branch occlusion, beading, or aneurysm. Venous sinuses: Unremarkable in the arterial phase Anatomic variants: Fetal type left PCA Review of the MIP images confirms the above findings IMPRESSION: 1. No emergent finding. 2. Mild for age atherosclerosis with no flow limiting stenosis or ulceration affecting major vessels. Electronically Signed   By: Monte Fantasia M.D.   On: 09/04/2019 06:59   Ct Angio Neck W Or Wo Contrast  Result Date: 09/04/2019 CLINICAL DATA:  Stroke workup.  Left-sided weakness EXAM: CT ANGIOGRAPHY HEAD AND NECK TECHNIQUE: Multidetector CT imaging of the head and neck was performed using the standard protocol during bolus administration of intravenous contrast. Multiplanar CT image reconstructions and MIPs were obtained to evaluate the vascular anatomy. Carotid stenosis measurements (when applicable) are obtained utilizing  NASCET criteria, using the distal internal carotid diameter as the denominator. CONTRAST:  42mL OMNIPAQUE IOHEXOL 350 MG/ML SOLN COMPARISON:  09/24/2017 FINDINGS: CTA NECK FINDINGS Aortic arch: Atherosclerotic plaque.  Two vessel branching. Right carotid system: Vessels are smooth and widely patent. Limited atheromatous changes. Left carotid system: Vessels are smooth and widely patent. Minimal atherosclerotic plaque at the ICA bulb. Vertebral arteries: Proximal subclavian atherosclerosis without stenosis or ulceration. Slight left vertebral artery dominance. Both vertebral arteries are smooth and widely patent to the dura.  Skeleton: Degenerative disc narrowing and facet spurring. Remote right parietal craniotomy, unremarkable. Other neck: No incidental mass or swelling. Upper chest: Negative Review of the MIP images confirms the above findings CTA HEAD FINDINGS Anterior circulation: Vessels are smooth and widely patent. No branch occlusion, aneurysm, or beading. Posterior circulation: Vertebrobasilar arteries are smooth and widely patent. Unremarkable cerebellar branches. No branch occlusion, beading, or aneurysm. Venous sinuses: Unremarkable in the arterial phase Anatomic variants: Fetal type left PCA Review of the MIP images confirms the above findings IMPRESSION: 1. No emergent finding. 2. Mild for age atherosclerosis with no flow limiting stenosis or ulceration affecting major vessels. Electronically Signed   By: Monte Fantasia M.D.   On: 09/04/2019 06:59    Scheduled Meds:  amLODipine  10 mg Oral Daily   aspirin EC  81 mg Oral Daily   atorvastatin  80 mg Oral Daily   docusate sodium  100 mg Oral QHS   pantoprazole  40 mg Oral QHS   warfarin  7.5 mg Oral ONCE-1800   Warfarin - Pharmacist Dosing Inpatient   Does not apply q1800    Continuous Infusions:  sodium chloride 75 mL/hr at 09/03/19 2042   heparin 850 Units/hr (09/03/19 2227)     LOS: 1 day     Kayleen Memos, MD [facility] Pager [phone]  If 7PM-7AM, please contact night-coverage [email or web address] Password [email or web address] 09/04/2019, 3:19 PM

## 2019-09-04 NOTE — Progress Notes (Addendum)
STROKE TEAM PROGRESS NOTE   HISTORY OF PRESENT ILLNESS (per record) [name] is a 73 y.o. 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 Pt presents with wake up confusion. She notes that she put shoe on the left foot she felt as though there was a ball sensation under her foot. Later that day she noticed that her dexterity of her left hand was significantly decreased. At that time she became nervous that she might of had another stroke.  Patient also noted she forgot to take a dose of her Coumadin. LKW: 10 03/02/2019 tpa given?: no, out of window Premorbid modified Rankin scale (mRS): 1 NIH stroke score 2   INTERVAL HISTORY Her husband is at the bedside. Currently she feels stronger in her left arm/dexterity but still not back to normal.MRI is abnormal, but not clearly a "new" infarct inside of the bed of her old infarct on the right, there is some hemorrhagic component as well.  I have personally reviewed history of presenting illness, electronic medical records and imaging films in PACS. OBJECTIVE Vitals:   09/03/19 2351 09/04/19 0411 09/04/19 0720 09/04/19 0831  BP: (!) 116/55 124/73 138/73 126/76  Pulse: 71 60  65  Resp: 12 12 16 16   Temp: 98.6 F (37 C) 98 F (36.7 C) 97.7 F (36.5 C) (!) 97.5 F (36.4 C)  TempSrc: Oral Oral Oral Oral  SpO2: 95% 94% 97% 97%  Weight:      Height:        CBC:  Recent Labs  Lab 09/02/19 2039 09/04/19 0359  WBC 4.4 4.1  NEUTROABS 2.7  --   HGB 13.4 11.5*  HCT 41.4 35.9*  MCV 91.2 90.7  PLT 181 [id number]    Basic Metabolic Panel:  Recent Labs  Lab 09/02/19 2039 09/04/19 0359  NA 140 142  K 3.6 4.0  CL 106 112*  CO2 23 24  GLUCOSE 135* 131*  BUN 34* 30*  CREATININE 0.78 0.59  CALCIUM 9.3 8.5*    Lipid Panel:     Component Value Date/Time   CHOL 125 09/04/2019 0359   TRIG 29 09/04/2019 0359   HDL 54 09/04/2019 0359   CHOLHDL 2.3  09/04/2019 0359   VLDL 6 09/04/2019 0359   LDLCALC 65 09/04/2019 0359   HgbA1c:  Lab Results  Component Value Date   HGBA1C 6.1 (H) 09/04/2019   Urine Drug Screen:     Component Value Date/Time   LABOPIA NONE DETECTED 09/24/2017 0240   COCAINSCRNUR NONE DETECTED 09/24/2017 0240   LABBENZ NONE DETECTED 09/24/2017 0240   AMPHETMU NONE DETECTED 09/24/2017 0240   THCU NONE DETECTED 09/24/2017 0240   LABBARB NONE DETECTED 09/24/2017 0240    Alcohol Level No results found for: ETH  IMAGING   Ct Angio Head W Or Wo Contrast  Result Date: 09/04/2019 CLINICAL DATA:  Stroke workup.  Left-sided weakness EXAM: CT ANGIOGRAPHY HEAD AND NECK TECHNIQUE: Multidetector CT imaging of the head and neck was performed using the standard protocol during bolus administration of intravenous contrast. Multiplanar CT image reconstructions and MIPs were obtained to evaluate the vascular anatomy. Carotid stenosis measurements (when applicable) are obtained utilizing NASCET criteria, using the distal internal carotid diameter as the denominator. CONTRAST:  77mL OMNIPAQUE IOHEXOL 350 MG/ML SOLN COMPARISON:  09/24/2017 FINDINGS: CTA NECK FINDINGS Aortic arch: Atherosclerotic plaque.  Two vessel branching. Right carotid system: Vessels are smooth and widely patent. Limited atheromatous changes. Left carotid system:  Vessels are smooth and widely patent. Minimal atherosclerotic plaque at the ICA bulb. Vertebral arteries: Proximal subclavian atherosclerosis without stenosis or ulceration. Slight left vertebral artery dominance. Both vertebral arteries are smooth and widely patent to the dura. Skeleton: Degenerative disc narrowing and facet spurring. Remote right parietal craniotomy, unremarkable. Other neck: No incidental mass or swelling. Upper chest: Negative Review of the MIP images confirms the above findings CTA HEAD FINDINGS Anterior circulation: Vessels are smooth and widely patent. No branch occlusion, aneurysm, or  beading. Posterior circulation: Vertebrobasilar arteries are smooth and widely patent. Unremarkable cerebellar branches. No branch occlusion, beading, or aneurysm. Venous sinuses: Unremarkable in the arterial phase Anatomic variants: Fetal type left PCA Review of the MIP images confirms the above findings IMPRESSION: 1. No emergent finding. 2. Mild for age atherosclerosis with no flow limiting stenosis or ulceration affecting major vessels. Electronically Signed   By: Monte Fantasia M.D.   On: 09/04/2019 06:59   Ct Head Wo Contrast  Result Date: 09/02/2019 CLINICAL DATA:  Left-sided weakness EXAM: CT HEAD WITHOUT CONTRAST TECHNIQUE: Contiguous axial images were obtained from the base of the skull through the vertex without intravenous contrast. COMPARISON:  09/24/2017 FINDINGS: Brain: Stable encephalomalacia is noted in the right frontal parietal region unchanged from the prior exam. Overlying craniotomy defect is seen. Scattered chronic atrophic changes are noted. Prior left thalamic infarct is again noted stable. Chronic cerebellar lacunar infarcts are noted as well. Vascular: No hyperdense vessel or unexpected calcification. Skull: Craniotomy defect is again noted as described. Sinuses/Orbits: No acute finding. Other: None. IMPRESSION: Chronic changes without acute abnormality. Electronically Signed   By: Inez Catalina M.D.   On: 09/02/2019 21:31   Ct Angio Neck W Or Wo Contrast  Result Date: 09/04/2019 CLINICAL DATA:  Stroke workup.  Left-sided weakness EXAM: CT ANGIOGRAPHY HEAD AND NECK TECHNIQUE: Multidetector CT imaging of the head and neck was performed using the standard protocol during bolus administration of intravenous contrast. Multiplanar CT image reconstructions and MIPs were obtained to evaluate the vascular anatomy. Carotid stenosis measurements (when applicable) are obtained utilizing NASCET criteria, using the distal internal carotid diameter as the denominator. CONTRAST:  53mL OMNIPAQUE  IOHEXOL 350 MG/ML SOLN COMPARISON:  09/24/2017 FINDINGS: CTA NECK FINDINGS Aortic arch: Atherosclerotic plaque.  Two vessel branching. Right carotid system: Vessels are smooth and widely patent. Limited atheromatous changes. Left carotid system: Vessels are smooth and widely patent. Minimal atherosclerotic plaque at the ICA bulb. Vertebral arteries: Proximal subclavian atherosclerosis without stenosis or ulceration. Slight left vertebral artery dominance. Both vertebral arteries are smooth and widely patent to the dura. Skeleton: Degenerative disc narrowing and facet spurring. Remote right parietal craniotomy, unremarkable. Other neck: No incidental mass or swelling. Upper chest: Negative Review of the MIP images confirms the above findings CTA HEAD FINDINGS Anterior circulation: Vessels are smooth and widely patent. No branch occlusion, aneurysm, or beading. Posterior circulation: Vertebrobasilar arteries are smooth and widely patent. Unremarkable cerebellar branches. No branch occlusion, beading, or aneurysm. Venous sinuses: Unremarkable in the arterial phase Anatomic variants: Fetal type left PCA Review of the MIP images confirms the above findings IMPRESSION: 1. No emergent finding. 2. Mild for age atherosclerosis with no flow limiting stenosis or ulceration affecting major vessels. Electronically Signed   By: Monte Fantasia M.D.   On: 09/04/2019 06:59   Mr Brain Wo Contrast  Result Date: 09/03/2019 CLINICAL DATA:  Hypertension, diabetes and hyperlipidemia. Stroke with left hemiparesis. Worsening weakness on the left. Symptoms began yesterday. EXAM: MRI HEAD WITHOUT CONTRAST  TECHNIQUE: Multiplanar, multiecho pulse sequences of the brain and surrounding structures were obtained without intravenous contrast. COMPARISON:  Head CT 09/02/2019.  MRI 09/24/2017. FINDINGS: Brain: There has been extensive old hemorrhagic infarction in the right parietal cortical and subcortical brain. Overlying old craniotomy. The  area is affected by encephalomalacia and gliosis. There are some chronic foci of tissue restricted diffusion that I do not think relate to acute infarction. Along the anterior margin, there is a more linear focus of restricted diffusion within the adjacent white matter and a small focus in the adjacent cortex which could represent a tiny extension of the infarction. No other acute infarction suspected. Elsewhere, there chronic small-vessel ischemic changes of the pons. There are numerous old small vessel cerebellar infarctions. Cerebral hemispheres show old small vessel infarctions affecting the thalami and chronic small-vessel ischemic changes the deep white matter without evidence of other large vessel territory insult. No sign recent hemorrhage, hydrocephalus or extra-axial fluid collection. I suspect that there is a tiny meningioma along the left side of the posterior falx in the posterior parietal region, no more than 2-3 mm in thickness. This is unchanged since 2018. Vascular: Major vessels at the base of the brain show flow. Skull and upper cervical spine: Otherwise negative Sinuses/Orbits: Clear/normal Other: None IMPRESSION: Old hemorrhagic infarction in the right parietal cortical and subcortical brain which has progressed to atrophy, encephalomalacia and gliosis. Suspicion of a minor acute extension along the anterior margin of the old infarction affecting the deep white matter and a small focus adjacent cortical brain. Extensive old ischemic changes elsewhere throughout the brain as outlined above. Tiny stable meningioma along the left posterior falx without mass-effect upon the brain or apparent change since 2018. Electronically Signed   By: Nelson Chimes M.D.   On: 09/03/2019 08:34     Transthoracic Echocardiogram   Recent Results (from the past 43800 hour(s))  ECHOCARDIOGRAM COMPLETE   Collection Time: 09/04/19  1:20 PM  Result Value   Weight 2,140.8   Height 67   BP 126/76   Narrative      ECHOCARDIOGRAM REPORT       Patient Name:   [name] Date of Exam: 09/04/2019 Medical Rec #:  [id number]      Height:       67.0 in Accession #:    [id number]     Weight:       133.8 lb Date of Birth:  [birth year]      BSA:          1.70 m Patient Age:    24 years       BP:           126/76 mmHg Patient Gender: F              HR:           65 bpm. Exam Location:  Inpatient  Procedure: 2D Echo  Indications:    Stroke 434.91/I163.9   History:        Patient has prior history of Echocardiogram examinations, most                 recent 05/12/2016. Mitral Valve: A mechanical valve is present in                 the mitral position. 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 Hx stroke.   Sonographer:  Clayton Lefort RDCS (AE) Referring Phys: [id number] RONDELL A SMITH  IMPRESSIONS    1. Left ventricular ejection fraction, by visual estimation, is 55 to 60%. The left ventricle has normal function. There is no left ventricular hypertrophy.  2. Abnormal septal motion consistent with post-operative status.  3. Global right ventricle has normal systolic function.The right ventricular size is normal. No increase in right ventricular wall thickness.  4. Left atrial size was severely dilated.  5. Right atrial size was mildly dilated.  6. Small pericardial effusion.  7. The pericardial effusion is circumferential.  8. The mitral valve has been repaired/replaced. No evidence of mitral valve regurgitation.  9. The tricuspid valve is normal in structure. Tricuspid valve regurgitation mild-moderate. 10. The aortic valve is normal in structure. Aortic valve regurgitation was not visualized by color flow Doppler. Mild aortic valve sclerosis without stenosis. 11. There is Mild calcification of the aortic valve. 12. There is Mild thickening of the aortic valve. 13. The pulmonic valve was normal in structure. Pulmonic valve regurgitation is trivial by color flow  Doppler. 14. Normal pulmonary artery systolic pressure. 15. Mitral gradients are similar to the previous study.  FINDINGS  Left Ventricle: Left ventricular ejection fraction, by visual estimation, is 55 to 60%. The left ventricle has normal function. No evidence of left ventricular regional wall motion abnormalities. There is no left ventricular hypertrophy. Abnormal  (paradoxical) septal motion consistent with post-operative status.  Right Ventricle: The right ventricular size is normal. No increase in right ventricular wall thickness. Global RV systolic function is has normal systolic function. The tricuspid regurgitant velocity is 2.44 m/s, and with an assumed right atrial pressure  of 3 mmHg, the estimated right ventricular systolic pressure is normal at 26.7 mmHg.  Left Atrium: Left atrial size was severely dilated.  Right Atrium: Right atrial size was mildly dilated  Pericardium: A small pericardial effusion is present. The pericardial effusion is circumferential.  Mitral Valve: The mitral valve has been repaired/replaced. MV peak gradient, 7.3 mmHg. No evidence of mitral valve regurgitation. There is a bright linear structure connecting the medial papillary muscle to the mitral annulus, probably a surgically  placed neo-chord (maybe attempted repair prior to replacement with a mechanical prosthesis.  Tricuspid Valve: The tricuspid valve is normal in structure. Tricuspid valve regurgitation mild-moderate by color flow Doppler.  Aortic Valve: The aortic valve is normal in structure.. There is Mild thickening and Mild calcification of the aortic valve. Aortic valve regurgitation was not visualized by color flow Doppler. Mild aortic valve sclerosis is present, with no evidence of  aortic valve stenosis. There is Mild thickening of the aortic valve. Mild calcification. Aortic valve mean gradient measures 2.0 mmHg. Aortic valve peak gradient measures 4.2 mmHg. Aortic valve area, by VTI  measures 2.06 cm.  Pulmonic Valve: The pulmonic valve was normal in structure. Pulmonic valve regurgitation is trivial by color flow Doppler.  Aorta: The aortic root and ascending aorta are structurally normal, with no evidence of dilitation.  IAS/Shunts: No atrial level shunt detected by color flow Doppler.     LEFT VENTRICLE PLAX 2D LVIDd:         3.48 cm  Diastology LVIDs:         2.52 cm  LV e' lateral:   8.27 cm/s LV PW:         1.12 cm  LV E/e' lateral: 14.0 LV IVS:        1.06 cm  LV e' medial:    8.70 cm/s  LVOT diam:     1.80 cm  LV E/e' medial:  13.3 LV SV:         27 ml LV SV Index:   16.20 LVOT Area:     2.54 cm    RIGHT VENTRICLE            IVC RV Basal diam:  2.95 cm    IVC diam: 1.64 cm RV S prime:     9.03 cm/s TAPSE (M-mode): 2.0 cm  LEFT ATRIUM             Index       RIGHT ATRIUM           Index LA diam:        3.50 cm 2.05 cm/m  RA Area:     21.10 cm LA Vol (A2C):   82.2 ml 48.22 ml/m RA Volume:   64.20 ml  37.66 ml/m LA Vol (A4C):   69.4 ml 40.71 ml/m LA Biplane Vol: 75.8 ml 44.47 ml/m  AORTIC VALVE AV Area (Vmax):    1.85 cm AV Area (Vmean):   1.87 cm AV Area (VTI):     2.06 cm AV Vmax:           103.00 cm/s AV Vmean:          70.900 cm/s AV VTI:            0.224 m AV Peak Grad:      4.2 mmHg AV Mean Grad:      2.0 mmHg LVOT Vmax:         74.80 cm/s LVOT Vmean:        52.200 cm/s LVOT VTI:          0.181 m LVOT/AV VTI ratio: 0.81   AORTA Ao Root diam: 3.30 cm  MITRAL VALVE                         TRICUSPID VALVE MV Area (PHT): 3.93 cm              TR Peak grad:   23.7 mmHg MV Peak grad:  7.3 mmHg              TR Vmax:        251.00 cm/s MV Mean grad:  3.0 mmHg MV Vmax:       1.35 m/s              SHUNTS MV Vmean:      81.8 cm/s             Systemic VTI:  0.18 m MV VTI:        0.33 m                Systemic Diam: 1.80 cm MV PHT:        55.97 msec MV Decel Time: 193 msec MV E velocity: 116.00 cm/s 103 cm/s MV A velocity: 75.10  cm/s  70.3 cm/s MV E/A ratio:  1.54        1.5    Mihai Croitoru MD Electronically signed by Sanda Klein MD Signature Date/Time: 09/04/2019/2:55:29 PM       Final     *Note: Due to a large number of results and/or encounters for the requested time period, some results have not been displayed. A complete set of results can be found in Results Review.   ECG - SR rate 70 BPM. (See cardiology reading for complete details)  PHYSICAL EXAM Blood pressure 126/76, pulse 65, temperature (!) 97.5 F (36.4 C), temperature source Oral, resp. rate 16, height 5\' 7"  (1.702 m), weight 60.7 kg, SpO2 97 %.  GEN: Appears well-developed and well-nourished elderly Caucasian lady.  Psych: Affect appropriate to situation Eyes: No scleral injection Head: Normocephalic.  Cardiovascular: Normal rate and regular rhythm.  Respiratory: Effort normal, non-labored breathing GI: Soft.  No distension. There is no tenderness.  Skin: Warm, dry  Neuro: Mental Status: Patient is awake, alert, oriented to person, place, month, year, and situation. Patient is able to give a clear and coherent history. No signs of aphasia or neglect Cranial Nerves: II: Visual Fields are full.  III,IV, VI: EOMI without ptosis or diploplia. Pupils equal, round and reactive to light V: Facial sensation is symmetric to temperature VII: Facial movement is symmetric.  Possible slight decreased nasolabial fold on the left VIII: hearing is intact to voice X: Palat elevates symmetrically XI: Shoulder shrug is asymmetrical on the left XII: tongue is midline without atrophy or fasciculations.  Motor: 5/5 on the right, left arm is held in 45 degree contraction along with weakness and shoulder abduction, bicep flexion and tricep extension.  Diminished fine finger movements on the left.  Orbits right over left upper extremity.. Left leg has 5/5 strength.  Bobbing but no true drift with left leg Sensory: Sensation is symmetric to light  touch and temperature in the arms and legs. Deep Tendon Reflexes: 2+ in upper and lower extremities on the right, 3+ in upper extremity on the left, 3+ knee jerk on the left 2+ ankle jerk on the left with sustained clonus of the ankle.  Plantars: Upgoing on the left downgoing on the right Cerebellar: Dysmetria on the left arm with finger-to-nose and ataxia heel-to-shin on the left  HOME MEDICATIONS:  Medications Prior to Admission  Medication Sig Dispense Refill  . acetaminophen (TYLENOL) 500 MG tablet Take 1,000 mg by mouth every morning. Pt may take up to 6 tabs daily    . amLODipine (NORVASC) 10 MG tablet Take 10 mg daily by mouth.    Marland Kitchen antiseptic oral rinse (BIOTENE) LIQD 15 mLs by Mouth Rinse route as needed for dry mouth.    Marland Kitchen aspirin EC 81 MG tablet Take 81 mg by mouth daily.     Marland Kitchen atorvastatin (LIPITOR) 20 MG tablet Take 20 mg by mouth daily.    . calcium carbonate 1250 MG capsule Take 1,200 mg by mouth at bedtime.     . cholecalciferol (VITAMIN D) 1000 UNITS tablet Take 2,000 Units daily by mouth.     . docusate sodium (COLACE) 100 MG capsule Take 100 mg at bedtime by mouth.     Marland Kitchen omeprazole (PRILOSEC) 20 MG capsule Take 20 mg by mouth at bedtime.     Marland Kitchen Propylene Glycol-Glycerin (SOOTHE) 0.6-0.6 % SOLN Place 1 drop into both eyes 2 (two) times daily as needed (dry eyes).    Marland Kitchen denosumab (PROLIA) 60 MG/ML SOLN injection Inject 60 mg into the skin every 6 (six) months. Administer in upper arm, thigh, or abdomen    . warfarin (COUMADIN) 5 MG tablet TAKE BY MOUTH AS DIRECTED BY THE COUMADIN CLINIC FOR 30 DAYS. 45 tablet 3      HOSPITAL MEDICATIONS:  . amLODipine  10 mg Oral Daily  . aspirin EC  81 mg Oral Daily  . atorvastatin  80 mg Oral Daily  . docusate sodium  100 mg Oral QHS  . pantoprazole  40 mg  Oral QHS  . warfarin  7.5 mg Oral ONCE-1800  . Warfarin - Pharmacist Dosing Inpatient   Does not apply q1800    ALLERGIES Allergies  Allergen Reactions  . Zoloft [Sertraline]  Hives, Itching and Rash   ASSESSMENT/PLAN Ms. LYLIAN TYMINSKI is a 73 y.o. female with history of multiple strokes in the past, but good recover, mRS1. On 10/26 she presents to the hospital with increased hemiparesis of the left arm and leg.  MRI does confirm acute extension along the anterior margin of old infarction.  The exact etiology is less clear to me.  It is possible she had a transient hypoperfusion event as a new embolic event to the same distribution as the previous stroke would be unlikely..  The strokes are relatively small, and given the high risk of mechanical heart valve, I think it is reasonable to continue anticoagulation  Stroke: unsure etiology; unusual pattern within the bed of the old infarct possibly failure of collaterals.. 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 Appears to have some hemorrhagic component as well.   MRA head   CTA H&N - mild atherosclerosis, no stenosis  CT Perfusion not done  Carotid Doppler - not done  2D Echo - 55% EF, abnormal septal motion d/t post op effect. LA severely dilated, RA mild dilation, sm pericardial effusion, mild calcification/thickening of aortic valve.   Sars Corona Virus 2  neg  LDL - 65    Component Value Date/Time   LDLCALC 65 09/04/2019 0359     HgbA1c - 6.1  UDS neg  VTE prophylaxis - on anticoagulation Diet  Diet Order            Diet Heart Room service appropriate? Yes with Assist; Fluid consistency: Thin  Diet effective now               ASA + Coumadin prior to admission, now on same  Patient counseled to be compliant with her antithrombotic medications  Ongoing aggressive stroke risk factor management  Therapy  recommendations:  pending  Disposition:  Pending  Hypertension  Home BP meds: Norvasc  Current BP meds: Norvasc  Stable . Permissive hypertension (OK if < 220/120) but gradually normalize in next few days . Long-term BP goal normotensive  Hyperlipidemia  Home Lipid lowering medication:Lipitor 20mg   LDL 65, goal < 100  Current lipid lowering medication: Ok to continue home lipitor dosing, no role in high intensity statin since she is so far under goal. Will stop 80mg  dosing.  Continue statin at discharge  Diabetes  Home diabetic meds: none  Current diabetic meds:none  HgbA1c 6.1, goal < 7.0  No results for input(s): GLUCAP in the last 72 hours.  Other Stroke Risk Factors  Advanced age  Hx stroke/TIA  Coronary artery disease  Valve replacement  Other Active Problems  Hospital day # 1  Desiree Metzger-Cihelka, ARNP-C, ANVP-BC Pager: [phone]  I have personally obtained history,examined this patient, reviewed notes, independently viewed imaging studies, participated in medical decision making and plan of care.ROS completed by me personally and pertinent positives fully documented  I have made any additions or clarifications directly to the above note.  She presented with increased left hemiparesis and  MRI scan shows tiny punctate infarct in the bed of the old large infarct.  Her INR was suboptimal at 2.1 for her mechanical heart valve.  Agree with IV heparin bridge till INR is between 3-3 0.5.  Long discussion patient and husband at the bedside and answered questions.  Greater than 50% time during this 35-minute visit was spent on counseling and coordination of care about her embolic stroke and discussion about anticoagulation and mechanical heart valve  Antony Contras, MD Medical Director [facility] Pager: [phone] 09/04/2019 5:15 PM To contact Stroke Continuity provider, please refer to [email or web address] After hours, contact General Neurology

## 2019-09-04 NOTE — Progress Notes (Signed)
[place] for Heparin/Warfarin  Indication: mechanical MVR  Allergies  Allergen Reactions  . Zoloft [Sertraline] Hives, Itching and Rash    Patient Measurements: Height: 5\' 7"  (170.2 cm) Weight: 133 lb 12.8 oz (60.7 kg) IBW/kg (Calculated) : 61.6  Vital Signs: Temp: 98 F (36.7 C) (10/27 0411) Temp Source: Oral (10/27 0411) BP: 124/73 (10/27 0411) Pulse Rate: 60 (10/27 0411)  Labs: Recent Labs    09/02/19 2039 09/03/19 1057 09/03/19 2146 09/04/19 0359  HGB 13.4  --   --  11.5*  HCT 41.4  --   --  35.9*  PLT 181  --   --  156  APTT 44*  --   --   --   LABPROT 29.6* 25.9*  --  23.4*  INR 2.9* 2.4*  --  2.1*  HEPARINUNFRC  --   --  0.16* 0.38  CREATININE 0.78  --   --   --     Estimated Creatinine Clearance: 60 mL/min (by C-G formula based on SCr of 0.78 mg/dL).  Assessment: 89 yof presented to the ED with worsening left sided weakness. She is on chronic warfarin for history of a mechanical valve. INR was below goal upon presentation yesterday at 2.9 and is down to 2.4. Will continue with warfarin and also bridge with IV heparin until INR is therapeutic. Baseline CBC is WNL and no bleeding noted.   10/27 AM update:  Heparin level therapeutic x 1 after rate increase  Goal of Therapy:  Heparin level 0.3-0.5 units/ml INR 3-3.5 Monitor platelets by anticoagulation protocol: Yes   Plan:  Cont heparin at 850 units/hr Confirmatory heparin level at [place], PharmD, [place] Pharmacist Phone: [phone]

## 2019-09-04 NOTE — Progress Notes (Signed)
[place] for Heparin/Warfarin  Indication: mechanical MVR  Allergies  Allergen Reactions  . Zoloft [Sertraline] Hives, Itching and Rash    Patient Measurements: Height: 5\' 7"  (170.2 cm) Weight: 133 lb 12.8 oz (60.7 kg) IBW/kg (Calculated) : 61.6  Vital Signs: Temp: 97.5 F (36.4 C) (10/27 0831) Temp Source: Oral (10/27 0831) BP: 126/76 (10/27 0831) Pulse Rate: 65 (10/27 0831)  Labs: Recent Labs    09/02/19 2039 09/03/19 1057 09/03/19 2146 09/04/19 0359 09/04/19 1139  HGB 13.4  --   --  11.5*  --   HCT 41.4  --   --  35.9*  --   PLT 181  --   --  156  --   APTT 44*  --   --   --   --   LABPROT 29.6* 25.9*  --  23.4*  --   INR 2.9* 2.4*  --  2.1*  --   HEPARINUNFRC  --   --  0.16* 0.38 0.38  CREATININE 0.78  --   --  0.59  --     Estimated Creatinine Clearance: 60 mL/min (by C-G formula based on SCr of 0.59 mg/dL).  Assessment: 44 yof presented to the ED with worsening left sided weakness. She is on chronic warfarin for history of a mechanical valve. INR was below goal upon presentation yesterday at 2.9 and is down to 2.4. Will continue with warfarin and also bridge with IV heparin until INR is therapeutic. Baseline CBC is WNL and no bleeding noted.   10/27 PM update:  Heparin level therapeutic x 2 INR down to 2.1  Goal of Therapy:  Heparin level 0.3-0.5 units/ml INR 3-3.5 Monitor platelets by anticoagulation protocol: Yes   Plan:  Cont heparin at 850 units/hr Warfarin 7.5 mg po x 1 tonight Monitor daily heparin level, INR, and CBC  Alanda Slim, PharmD, [facility] Clinical Pharmacist Please see AMION for all Pharmacists' Contact Phone Numbers 09/04/2019, 2:09 PM

## 2019-09-04 NOTE — Progress Notes (Signed)
  Echocardiogram 2D Echocardiogram has been performed.  [name] 09/04/2019, 1:21 PM

## 2019-09-04 NOTE — Progress Notes (Signed)
SLP Consult Note  Patient Details Name: [name] MRN: [id number] DOB: 01/24/1946   SLP consult note:       Reason Eval/Treat Not Completed: SLP screened, no needs identified, will sign off. Per RN, pt passed [place], and is tolerating regular diet/thin liquids. If needs arise, please reconsult.  Celia B. Quentin Ore, [facility], [place] Speech Language Pathologist Office: [phone] Pager: [phone]  Shonna Chock 09/04/2019, 9:28 AM

## 2019-09-04 NOTE — Progress Notes (Signed)
Occupational Therapy Evaluation Patient Details Name: MARJANI PISTORIUS MRN: [id number] DOB: 1946-08-17 Today's Date: 09/04/2019    History of Present Illness JENNINGS BUSHNER is a 73 y.o. 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 Pt presents with increased L weakness and MRI suspicious for extension of prior CVA.    Clinical Impression   PTA, pt was living at home with her husband, pt reports she was mostly independent with ADL/IADL but required assistance from her husband prn, pt reports she used a cane for functional mobiltiy. Pt currently demonstrates limitations with LUE coordination and strength impacting her independence with ADL, she requires minA for functional mobility at RW level. Pt demonstrates cognitive limitations impacting her safety and independence with ADL and functional mobility. Due to decline in current level of function, pt would benefit from acute OT to address established goals to facilitate safe D/C to venue listed below. At this time, recommend CIR follow-up. Will continue to follow acutely.     Follow Up Recommendations  CIR    Equipment Recommendations  3 in 1 bedside commode    Recommendations for Other Services       Precautions / Restrictions Precautions Precautions: Fall Restrictions Weight Bearing Restrictions: No      Mobility Bed Mobility Overal bed mobility: Needs Assistance Bed Mobility: Rolling;Sidelying to Sit Rolling: Supervision Sidelying to sit: Supervision       General bed mobility comments: pt rolled L with use of rail with RUE, able to come to sitting with increased time and had difficulty moving L side body into position EOB  Transfers Overall transfer level: Needs assistance Equipment used: Rolling walker (2 wheeled) Transfers: Sit to/from Stand Sit to Stand: Min assist          General transfer comment: min A to gain balance as pt came to standing, vc's for mgmt of LUE.     Balance Overall balance assessment: Needs assistance Sitting-balance support: Single extremity supported Sitting balance-Leahy Scale: Fair Sitting balance - Comments: balance limitation noted in sitting, decreased ability to achieve balance with perturbation to L   Standing balance support: Bilateral upper extremity supported Standing balance-Leahy Scale: Poor Standing balance comment: unable to safely stand without support today                           ADL either performed or assessed with clinical judgement   ADL Overall ADL's : Needs assistance/impaired Eating/Feeding: Minimal assistance;Sitting   Grooming: Minimal assistance;Sitting Grooming Details (indicate cue type and reason): pt able to complete ADL with increased time and effort;required minA for stability while standing at sink level;brushed teeth  Upper Body Bathing: Min guard;Sitting   Lower Body Bathing: Min guard;Sit to/from stand Lower Body Bathing Details (indicate cue type and reason): pt donned shoes and tied shoe laces,  Upper Body Dressing : Min guard;Sitting Upper Body Dressing Details (indicate cue type and reason): appears she may require assistance for buttons Lower Body Dressing: Minimal assistance;Sit to/from stand   Toilet Transfer: Minimal assistance;RW;Ambulation Armed forces technical officer Details (indicate cue type and reason): required increased time to sit on the commode Toileting- Clothing Manipulation and Hygiene: Minimal assistance;Sit to/from stand       Functional mobility during ADLs: Minimal assistance;Rolling walker General ADL Comments: demonstrated decreased activity tolerance, decreased use of BUE functionally, and increased fatigue towards end of session  Vision Baseline Vision/History: Wears glasses Wears Glasses: Reading only Patient Visual Report: No change from  baseline Vision Assessment?: Yes Eye Alignment: Within Functional Limits Ocular Range of Motion: Within Functional Limits Alignment/Gaze Preference: Within Defined Limits Tracking/Visual Pursuits: Able to track stimulus in all quads without difficulty Saccades: Within functional limits     Perception     Praxis      Pertinent Vitals/Pain Pain Assessment: No/denies pain     Hand Dominance Right   Extremity/Trunk Assessment Upper Extremity Assessment Upper Extremity Assessment: RUE deficits/detail;LUE deficits/detail RUE Deficits / Details: grossly 3+/5 MMT;demonstrated slight tremor during functional activities, reports this is baseline, impacts coordination during activities RUE Sensation: WNL RUE Coordination: decreased fine motor LUE Deficits / Details: strength grossly 3/5;decreased coordination with finger-to-nose;decreased grip strength impacting ability to open containers;decreased fine motor coordination and rapid alternating movements;demonstrates compensatory strategies for decreased fine motor coordination during bilateral tasks, uses RUE dominantly LUE Sensation: WNL LUE Coordination: decreased fine motor;decreased gross motor   Lower Extremity Assessment Lower Extremity Assessment: Defer to PT evaluation LLE Deficits / Details: reported increased weakness and control following session, alerted RN who reported she was going to see her.  LLE Sensation: decreased light touch;decreased proprioception LLE Coordination: decreased fine motor;decreased gross motor   Cervical / Trunk Assessment Cervical / Trunk Assessment: Other exceptions Cervical / Trunk Exceptions: scoliosis   Communication Communication Communication: No difficulties(slowed speech at baseline)   Cognition Arousal/Alertness: Awake/alert Behavior During Therapy: WFL for tasks assessed/performed Overall Cognitive Status: Impaired/Different from baseline Area of Impairment: Problem solving;Following  commands                       Following Commands: Follows multi-step commands with increased time;Follows multi-step commands consistently     Problem Solving: Slow processing;Difficulty sequencing;Requires verbal cues;Requires tactile cues General Comments: had difficulty sequencing tasks such as turning around to sit on toilet and sequencing appropriate steps for brushing teeth, required her to audibly discuss steps;requires extended time to process through multistep sequences, verbalized she wanted to use the commode prior to returning to bed, went to bed first, realized prior to sitting and proceeded to toilet, was able to self correct   General Comments  pt's husband present during session;pt very motivated to participate with therapy    Exercises     Shoulder Instructions      Home Living Family/patient expects to be discharged to:: Private residence Living Arrangements: Spouse/significant other Available Help at Discharge: Family Type of Home: House Home Access: Stairs to enter Technical brewer of Steps: 1 Entrance Stairs-Rails: None Home Layout: Able to live on main level with bedroom/bathroom     Bathroom Shower/Tub: Teacher, early years/pre: Standard     Home Equipment: Environmental consultant - 2 wheels;Cane - single point          Prior Functioning/Environment Level of Independence: Needs assistance  Gait / Transfers Assistance Needed: uses cane in community, furniture walks at times at home. Wears L AFO ADL's / Homemaking Assistance Needed: sponge bathes, husband assists            OT Problem List: Decreased activity tolerance;Impaired balance (sitting and/or standing);Decreased coordination;Decreased safety awareness;Decreased cognition;Impaired UE functional use      OT Treatment/Interventions: Self-care/ADL training;Therapeutic exercise;Energy conservation;DME and/or AE instruction;Therapeutic activities;Patient/family education;Balance  training;Cognitive remediation/compensation    OT Goals(Current goals can be found in the care plan section) Acute Rehab OT Goals Patient Stated Goal: return to PLOF OT Goal Formulation: With patient Time  For Goal Achievement: 09/18/19 Potential to Achieve Goals: Good ADL Goals Pt Will Perform Grooming: with modified independence;sitting;standing Pt Will Perform Upper Body Dressing: with modified independence Pt Will Perform Lower Body Dressing: with modified independence;sit to/from stand Pt Will Transfer to Toilet: with modified independence;ambulating Pt Will Perform Toileting - Clothing Manipulation and hygiene: with modified independence;sit to/from stand  OT Frequency: Min 2X/week   Barriers to D/C: Decreased caregiver support          Co-evaluation              AM-PAC OT "6 Clicks" Daily Activity     Outcome Measure Help from another person eating meals?: A Little Help from another person taking care of personal grooming?: A Little Help from another person toileting, which includes using toliet, bedpan, or urinal?: A Little Help from another person bathing (including washing, rinsing, drying)?: A Little Help from another person to put on and taking off regular upper body clothing?: A Little Help from another person to put on and taking off regular lower body clothing?: A Little 6 Click Score: 18   End of Session Equipment Utilized During Treatment: Gait belt;Rolling walker Nurse Communication: Mobility status  Activity Tolerance: Patient tolerated treatment well Patient left: in bed;with call bell/phone within reach;with bed alarm set;with family/visitor present  OT Visit Diagnosis: Unsteadiness on feet (R26.81);Other abnormalities of gait and mobility (R26.89);Muscle weakness (generalized) (M62.81);Other symptoms and signs involving cognitive function                Time: 1059-1140 OT Time Calculation (min): 41 min Charges:  OT General Charges $OT Visit: 1  Visit OT Evaluation $OT Eval Moderate Complexity: 1 Mod OT Treatments $Self Care/Home Management : 23-37 mins  Dorinda Hill OTR/L Acute Rehabilitation Services Office: [place] 09/04/2019, 2:29 PM

## 2019-09-04 NOTE — Evaluation (Signed)
Physical Therapy Evaluation Patient Details Name: [name] MRN: [id number] DOB: 1945/12/20 Today's Date: 09/04/2019   History of Present Illness  [name] is a 73 y.o. 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 Pt presents with increased L weakness and MRI suspicious for extension of prior CVA.   Clinical Impression  Pt admitted with above diagnosis. Pt presents with worsened apraxia and L sided weakness compared to her baseline. Pt unsafe with ambulation with SPC which was her prior AD, needed RW for safety as well as min A with mod A at times for turning and problem solving changes in direction. Pt is very motivated and would benefit from a short bout of intense rehab before transitioning home and into an outpt setting.  Pt currently with functional limitations due to the deficits listed below (see PT Problem List). Pt will benefit from skilled PT to increase their independence and safety with mobility to allow discharge to the venue listed below.       Follow Up Recommendations CIR    Equipment Recommendations  Rolling walker with 5" wheels    Recommendations for Other Services Rehab consult;OT consult     Precautions / Restrictions Precautions Precautions: Fall Restrictions Weight Bearing Restrictions: No      Mobility  Bed Mobility Overal bed mobility: Needs Assistance Bed Mobility: Rolling;Sidelying to Sit Rolling: Supervision Sidelying to sit: Supervision       General bed mobility comments: pt rolled L with use of rail with RUE, able to come to sitting with increased time and had difficulty moving L side body into position EOB  Transfers Overall transfer level: Needs assistance Equipment used: Rolling walker (2 wheeled) Transfers: Sit to/from Stand Sit to Stand: Min assist         General transfer  comment: min A to gain balance as pt came to standing, vc's for mgmt of LUE.   Ambulation/Gait Ambulation/Gait assistance: Min assist;Mod assist Gait Distance (Feet): 50 Feet Assistive device: Rolling walker (2 wheeled) Gait Pattern/deviations: Step-to pattern;Decreased weight shift to left;Shuffle;Narrow base of support;Decreased step length - left Gait velocity: decreased Gait velocity interpretation: <1.31 ft/sec, indicative of household ambulator General Gait Details: pt dragging LLE initially and not wt shifting to L. Was able to increase step length with vc's but still not past RLE. Pt reliant on RW, would not be safe with SPC at this point (her device at home PTA). Needed mod A for turning and mod instructional cues at times due to problem solving deficits  Stairs            Wheelchair Mobility    Modified Rankin (Stroke Patients Only) Modified Rankin (Stroke Patients Only) Pre-Morbid Rankin Score: Moderate disability Modified Rankin: Moderately severe disability     Balance Overall balance assessment: Needs assistance Sitting-balance support: Single extremity supported Sitting balance-Leahy Scale: Fair Sitting balance - Comments: balance limitation noted in sitting, decreased ability to achieve balance with perturbation to L   Standing balance support: Bilateral upper extremity supported Standing balance-Leahy Scale: Poor Standing balance comment: unable to safely stand without support today                             Pertinent Vitals/Pain Pain Assessment: No/denies pain    Home Living Family/patient expects to be discharged to:: Private residence Living Arrangements: Spouse/significant other Available Help at Discharge:  Family Type of Home: House Home Access: Stairs to enter Entrance Stairs-Rails: None Entrance Stairs-Number of Steps: 1 Home Layout: Able to live on main level with bedroom/bathroom Home Equipment: Walker - 2 wheels;Cane - single  point      Prior Function Level of Independence: Needs assistance   Gait / Transfers Assistance Needed: uses cane in community, furniture walks at times at home. Wears L AFO  ADL's / Homemaking Assistance Needed: sponge bathes, husband assists        Hand Dominance   Dominant Hand: Right    Extremity/Trunk Assessment   Upper Extremity Assessment Upper Extremity Assessment: Defer to OT evaluation    Lower Extremity Assessment Lower Extremity Assessment: LLE deficits/detail LLE Deficits / Details: foot drop on L worse than baseline, had some difficulty donning AFO. Strength grossly 3/5 at knee and hip but decreased neuromuscular control noted with functional activity  LLE Sensation: decreased light touch;decreased proprioception LLE Coordination: decreased fine motor;decreased gross motor    Cervical / Trunk Assessment Cervical / Trunk Assessment: Other exceptions Cervical / Trunk Exceptions: scoliosis  Communication   Communication: No difficulties(slowed speech at baseline)  Cognition Arousal/Alertness: Awake/alert Behavior During Therapy: WFL for tasks assessed/performed Overall Cognitive Status: Impaired/Different from baseline Area of Impairment: Problem solving;Following commands                       Following Commands: Follows multi-step commands with increased time;Follows multi-step commands consistently     Problem Solving: Slow processing;Difficulty sequencing;Requires verbal cues;Requires tactile cues General Comments: had difficulty sequencing tasks such as turning around to sit on toilet.       General Comments General comments (skin integrity, edema, etc.): pt very motivated to return to baseline. Husband home with her 24/7    Exercises     Assessment/Plan    PT Assessment Patient needs continued PT services  PT Problem List Decreased strength;Decreased activity tolerance;Decreased balance;Decreased mobility;Decreased  coordination;Decreased cognition;Decreased knowledge of use of DME;Impaired sensation       PT Treatment Interventions DME instruction;Gait training;Stair training;Functional mobility training;Therapeutic activities;Therapeutic exercise;Balance training;Neuromuscular re-education;Cognitive remediation;Patient/family education    PT Goals (Current goals can be found in the Care Plan section)  Acute Rehab PT Goals Patient Stated Goal: return to PLOF PT Goal Formulation: With patient Time For Goal Achievement: 09/18/19 Potential to Achieve Goals: Good    Frequency Min 4X/week   Barriers to discharge        Co-evaluation               AM-PAC PT "6 Clicks" Mobility  Outcome Measure Help needed turning from your back to your side while in a flat bed without using bedrails?: A Little Help needed moving from lying on your back to sitting on the side of a flat bed without using bedrails?: A Little Help needed moving to and from a bed to a chair (including a wheelchair)?: A Little Help needed standing up from a chair using your arms (e.g., wheelchair or bedside chair)?: A Little Help needed to walk in hospital room?: A Little Help needed climbing 3-5 steps with a railing? : A Little 6 Click Score: 18    End of Session Equipment Utilized During Treatment: Gait belt Activity Tolerance: Patient tolerated treatment well Patient left: in chair;with call bell/phone within reach Nurse Communication: Mobility status PT Visit Diagnosis: Unsteadiness on feet (R26.81);Apraxia (R48.2)    [id number] PT Time Calculation (min) (ACUTE ONLY): 51 min   Charges:   PT  Evaluation $PT Eval Moderate Complexity: 1 Mod PT Treatments $Gait Training: 8-22 mins $Therapeutic Activity: 8-22 mins        Leighton Roach, [place]  Pager [phone] Office [place] 09/04/2019, 1:38 PM

## 2019-09-04 NOTE — Progress Notes (Signed)
Rehab Admissions Coordinator Note:  Patient was screened by Michel Santee for appropriateness for an Inpatient Acute Rehab Consult.  Note pt with no SLP needs.  Await recs from OT/PT. Will not order consult at this time.   Michel Santee 09/04/2019, 1:42 PM  I can be reached at [phone].

## 2019-09-04 NOTE — Plan of Care (Signed)
  Problem: Nutrition: Goal: Adequate nutrition will be maintained Outcome: Progressing   

## 2019-09-05 ENCOUNTER — Inpatient Hospital Stay (HOSPITAL_COMMUNITY)
Admission: RE | Admit: 2019-09-05 | Discharge: 2019-09-15 | DRG: 057 | Disposition: A | Discharge status: Home / Self Care | Payer: Medicare Other | Source: Intra-hospital | Attending: Physical Medicine & Rehabilitation | Admitting: Physical Medicine & Rehabilitation

## 2019-09-05 DIAGNOSIS — I6930 Unspecified sequelae of cerebral infarction: Secondary | ICD-10-CM

## 2019-09-05 DIAGNOSIS — D696 Thrombocytopenia, unspecified: Secondary | ICD-10-CM | POA: Diagnosis present

## 2019-09-05 DIAGNOSIS — I639 Cerebral infarction, unspecified: Secondary | ICD-10-CM | POA: Diagnosis present

## 2019-09-05 DIAGNOSIS — Z888 Allergy status to other drugs, medicaments and biological substances status: Secondary | ICD-10-CM | POA: Diagnosis not present

## 2019-09-05 DIAGNOSIS — E785 Hyperlipidemia, unspecified: Secondary | ICD-10-CM | POA: Diagnosis present

## 2019-09-05 DIAGNOSIS — Z7982 Long term (current) use of aspirin: Secondary | ICD-10-CM

## 2019-09-05 DIAGNOSIS — Z823 Family history of stroke: Secondary | ICD-10-CM | POA: Diagnosis not present

## 2019-09-05 DIAGNOSIS — I10 Essential (primary) hypertension: Secondary | ICD-10-CM

## 2019-09-05 DIAGNOSIS — Z79899 Other long term (current) drug therapy: Secondary | ICD-10-CM

## 2019-09-05 DIAGNOSIS — R7303 Prediabetes: Secondary | ICD-10-CM | POA: Diagnosis present

## 2019-09-05 DIAGNOSIS — I6300 Cerebral infarction due to thrombosis of unspecified precerebral artery: Secondary | ICD-10-CM | POA: Diagnosis not present

## 2019-09-05 DIAGNOSIS — I6381 Other cerebral infarction due to occlusion or stenosis of small artery: Secondary | ICD-10-CM | POA: Diagnosis not present

## 2019-09-05 DIAGNOSIS — Z8249 Family history of ischemic heart disease and other diseases of the circulatory system: Secondary | ICD-10-CM | POA: Diagnosis not present

## 2019-09-05 DIAGNOSIS — G8194 Hemiplegia, unspecified affecting left nondominant side: Secondary | ICD-10-CM

## 2019-09-05 DIAGNOSIS — Z952 Presence of prosthetic heart valve: Secondary | ICD-10-CM

## 2019-09-05 DIAGNOSIS — Z7901 Long term (current) use of anticoagulants: Secondary | ICD-10-CM | POA: Diagnosis not present

## 2019-09-05 DIAGNOSIS — I6389 Other cerebral infarction: Secondary | ICD-10-CM

## 2019-09-05 DIAGNOSIS — Z8349 Family history of other endocrine, nutritional and metabolic diseases: Secondary | ICD-10-CM | POA: Diagnosis not present

## 2019-09-05 DIAGNOSIS — I69354 Hemiplegia and hemiparesis following cerebral infarction affecting left non-dominant side: Principal | ICD-10-CM

## 2019-09-05 DIAGNOSIS — Z8673 Personal history of transient ischemic attack (TIA), and cerebral infarction without residual deficits: Secondary | ICD-10-CM

## 2019-09-05 LAB — CBC
HCT: 38.9 % (ref 36.0–46.0)
Hemoglobin: 12.6 g/dL (ref 12.0–15.0)
MCH: 29.4 pg (ref 26.0–34.0)
MCHC: 32.4 g/dL (ref 30.0–36.0)
MCV: 90.7 fL (ref 80.0–100.0)
Platelets: 146 10*3/uL — ABNORMAL LOW (ref 150–400)
RBC: 4.29 MIL/uL (ref 3.87–5.11)
RDW: 13.7 % (ref 11.5–15.5)
WBC: 3.9 10*3/uL — ABNORMAL LOW (ref 4.0–10.5)
nRBC: 0 % (ref 0.0–0.2)

## 2019-09-05 LAB — HEPARIN LEVEL (UNFRACTIONATED): Heparin Unfractionated: 0.56 IU/mL (ref 0.30–0.70)

## 2019-09-05 LAB — PHOSPHORUS: Phosphorus: 1.9 mg/dL — ABNORMAL LOW (ref 2.5–4.6)

## 2019-09-05 LAB — MAGNESIUM: Magnesium: 1.9 mg/dL (ref 1.7–2.4)

## 2019-09-05 LAB — PROTIME-INR
INR: 2.5 — ABNORMAL HIGH (ref 0.8–1.2)
Prothrombin Time: 26.4 seconds — ABNORMAL HIGH (ref 11.4–15.2)

## 2019-09-05 MED ORDER — DOCUSATE SODIUM 100 MG PO CAPS
100.0000 mg | ORAL_CAPSULE | Freq: Every day | ORAL | Status: DC
  Administered 2019-09-05 – 2019-09-14 (×10): 100 mg via ORAL
  Filled 2019-09-05 (×10): qty 1

## 2019-09-05 MED ORDER — POLYVINYL ALCOHOL 1.4 % OP SOLN
1.0000 [drp] | Freq: Two times a day (BID) | OPHTHALMIC | Status: DC | PRN
  Filled 2019-09-05: qty 15

## 2019-09-05 MED ORDER — ACETAMINOPHEN 650 MG RE SUPP: 650.0000 mg | RECTAL | Status: DC | PRN

## 2019-09-05 MED ORDER — WARFARIN SODIUM 7.5 MG PO TABS
7.5000 mg | ORAL_TABLET | Freq: Once | ORAL | Status: AC
  Administered 2019-09-05: 7.5 mg via ORAL
  Filled 2019-09-05: qty 1

## 2019-09-05 MED ORDER — WARFARIN SODIUM 7.5 MG PO TABS: 7.5000 mg | ORAL_TABLET | Freq: Once | ORAL | Status: DC

## 2019-09-05 MED ORDER — ENOXAPARIN SODIUM 60 MG/0.6ML ~~LOC~~ SOLN
60.0000 mg | Freq: Two times a day (BID) | SUBCUTANEOUS | Status: DC
  Filled 2019-09-05: qty 0.6

## 2019-09-05 MED ORDER — ACETAMINOPHEN 160 MG/5ML PO SOLN: 650.0000 mg | ORAL | Status: DC | PRN

## 2019-09-05 MED ORDER — SORBITOL 70 % SOLN
30.0000 mL | Freq: Every day | Status: DC | PRN
  Administered 2019-09-06: 30 mL via ORAL
  Filled 2019-09-05: qty 30

## 2019-09-05 MED ORDER — ASPIRIN EC 81 MG PO TBEC
81.0000 mg | DELAYED_RELEASE_TABLET | Freq: Every day | ORAL | Status: DC
  Administered 2019-09-06 – 2019-09-15 (×10): 81 mg via ORAL
  Filled 2019-09-05 (×10): qty 1

## 2019-09-05 MED ORDER — PANTOPRAZOLE SODIUM 40 MG PO TBEC: 40.0000 mg | DELAYED_RELEASE_TABLET | Freq: Every day | ORAL | 0 refills | Status: DC

## 2019-09-05 MED ORDER — ALBUTEROL SULFATE (2.5 MG/3ML) 0.083% IN NEBU: 2.5000 mg | INHALATION_SOLUTION | Freq: Four times a day (QID) | RESPIRATORY_TRACT | Status: DC | PRN

## 2019-09-05 MED ORDER — AMLODIPINE BESYLATE 10 MG PO TABS
10.0000 mg | ORAL_TABLET | Freq: Every day | ORAL | Status: DC
  Administered 2019-09-06 – 2019-09-15 (×10): 10 mg via ORAL
  Filled 2019-09-05 (×10): qty 1

## 2019-09-05 MED ORDER — ACETAMINOPHEN 325 MG PO TABS
650.0000 mg | ORAL_TABLET | ORAL | Status: DC | PRN
  Administered 2019-09-05 – 2019-09-14 (×9): 650 mg via ORAL
  Filled 2019-09-05 (×12): qty 2

## 2019-09-05 MED ORDER — ATORVASTATIN CALCIUM 10 MG PO TABS
20.0000 mg | ORAL_TABLET | Freq: Every day | ORAL | Status: DC
  Administered 2019-09-06 – 2019-09-15 (×10): 20 mg via ORAL
  Filled 2019-09-05 (×10): qty 2

## 2019-09-05 MED ORDER — SODIUM PHOSPHATES 45 MMOLE/15ML IV SOLN
30.0000 mmol | Freq: Once | INTRAVENOUS | Status: DC
  Filled 2019-09-05: qty 10

## 2019-09-05 MED ORDER — WARFARIN - PHARMACIST DOSING INPATIENT
Freq: Every day | Status: DC
  Administered 2019-09-05: 1
  Administered 2019-09-06 – 2019-09-14 (×6)

## 2019-09-05 MED ORDER — POTASSIUM & SODIUM PHOSPHATES 280-160-250 MG PO PACK: 1.0000 | PACK | Freq: Three times a day (TID) | ORAL | 0 refills | Status: DC

## 2019-09-05 MED ORDER — ENOXAPARIN SODIUM 60 MG/0.6ML ~~LOC~~ SOLN
60.0000 mg | Freq: Two times a day (BID) | SUBCUTANEOUS | Status: DC
  Administered 2019-09-05 – 2019-09-07 (×4): 60 mg via SUBCUTANEOUS
  Filled 2019-09-05 (×4): qty 0.6

## 2019-09-05 MED ORDER — PANTOPRAZOLE SODIUM 40 MG PO TBEC
40.0000 mg | DELAYED_RELEASE_TABLET | Freq: Every day | ORAL | Status: DC
  Administered 2019-09-05 – 2019-09-14 (×10): 40 mg via ORAL
  Filled 2019-09-05 (×10): qty 1

## 2019-09-05 MED ORDER — POTASSIUM & SODIUM PHOSPHATES 280-160-250 MG PO PACK
1.0000 | PACK | Freq: Three times a day (TID) | ORAL | Status: DC
  Administered 2019-09-05 (×2): 1 via ORAL
  Filled 2019-09-05 (×3): qty 1

## 2019-09-05 NOTE — TOC Transition Note (Signed)
Transition of Care [facility]) - CM/SW Discharge Note   Patient Details  Name: CASMIRA MAMOLA MRN: [id number] Date of Birth: 22-Aug-1946  [facility] CM/SW Contact:  Pollie Friar, RN Phone Number: 09/05/2019, 12:10 PM   Clinical Narrative:    Pt is discharging to CIR today. CM signing off.    Final next level of care: IP Rehab Facility Barriers to Discharge: No Barriers Identified   Patient Goals and CMS Choice        Discharge Placement                       Discharge Plan and Services                                     Social Determinants of Health (SDOH) Interventions     Readmission Risk Interventions No flowsheet data found.

## 2019-09-05 NOTE — H&P (Signed)
Physical Medicine and Rehabilitation Admission H&P     HPI: [name] is a 73 year old right-handed female history of hypertension, diabetes mellitus, mechanical mitral valve replacement on anticoagulation as well as low-dose aspirin, multiple CVAs with residual left-sided weakness.  Per chart review and patient, she lives with her spouse. She used a cane in a community for ambulation furniture walking inside the home as well as wearing a left AFO brace.  Husband does assist with some basic ADLs.  Two-level home with bed and bath on main level and one-step to entry.  Presented 09/03/2019 with worsening left hemiparesis. Denied any chest pain or shortness of breath.  INR on admission of 2.9.  Cranial CT scan unremarkable for acute intracranial process, chronic changes. CT angiogram of head and neck with no emergent findings.  MRI showed old hemorrhagic infarct in the right parietal cortical and subcortical brain which has progressed atrophy, encephalomalacia and gliosis.  Suspicion of minor acute extension along the anterior margin of the old infarction affecting the deep white matter and small focus adjacent cortical brain.  Tiny stable meningioma along the left posterior falx without mass-effect without change since 2018.  Echo with EF of 60%. Neurology follow-up patient remains on low-dose aspirin and Coumadin prior to admission.  Tolerating a regular consistency diet.  Therapy evaluations completed and patient was admitted for a comprehensive rehab program.  Review of Systems  Constitutional: Negative for chills and fever.  HENT: Negative for hearing loss.   Eyes: Negative for blurred vision and double vision.  Respiratory: Negative for cough and shortness of breath.   Cardiovascular: Negative for chest pain.  Gastrointestinal: Positive for constipation. Negative for heartburn and nausea.  Genitourinary: Negative for dysuria, flank pain and hematuria.  Musculoskeletal: Positive for joint  pain and myalgias.  Skin: Negative for rash.  Neurological: Positive for dizziness, sensory change and focal weakness.  Psychiatric/Behavioral: The patient has insomnia.    Past Medical History:  Diagnosis Date   Abnormality of gait 09/07/2016   Allergy    Diabetes mellitus without complication [facility])    Patient denies this - notes history of glucose intolerance   Hemiparesis and alteration of sensations as late effects of stroke ([place]) 09/07/2016   History of pneumonia 1997   S/P MVR (mitral valve replacement)    Mechanical mitral valve replacement at age 61 (done in [place])  // echo 7/17: EF 55-60, normal wall motion, bileaflet mechanical mitral valve prosthesis functioning normally, mild LAE, mildly reduced RVSF, small pericardial effusion   Stroke ([place]) 1997, 2013, 2015   Past Surgical History:  Procedure Laterality Date   ABDOMINAL HYSTERECTOMY     BRAIN SURGERY     BUNIONECTOMY  1993   CARDIAC VALVE REPLACEMENT  1997   TOE [place]   Family History  Problem Relation Age of Onset   Cancer Mother        Bone   Heart disease Mother    Hyperlipidemia Mother    Hypertension Mother    Stroke Father    Hypertension Father    Heart attack Neg Hx    Social History:  reports that she has never smoked. She has never used smokeless tobacco. She reports that she does not drink alcohol or use drugs. Allergies:  Allergies  Allergen Reactions   Zoloft [Sertraline] Hives, Itching and Rash   Medications Prior to Admission  Medication Sig Dispense Refill   acetaminophen (TYLENOL) 500 MG tablet Take 1,000 mg by mouth every morning.  Pt may take up to 6 tabs daily     amLODipine (NORVASC) 10 MG tablet Take 10 mg daily by mouth.     antiseptic oral rinse (BIOTENE) LIQD 15 mLs by Mouth Rinse route as needed for dry mouth.     aspirin EC 81 MG tablet Take 81 mg by mouth daily.      atorvastatin (LIPITOR) 20 MG tablet Take 20 mg by mouth daily.     calcium  carbonate 1250 MG capsule Take 1,200 mg by mouth at bedtime.      cholecalciferol (VITAMIN D) 1000 UNITS tablet Take 2,000 Units daily by mouth.      docusate sodium (COLACE) 100 MG capsule Take 100 mg at bedtime by mouth.      omeprazole (PRILOSEC) 20 MG capsule Take 20 mg by mouth at bedtime.      Propylene Glycol-Glycerin (SOOTHE) 0.6-0.6 % SOLN Place 1 drop into both eyes 2 (two) times daily as needed (dry eyes).     denosumab (PROLIA) 60 MG/ML SOLN injection Inject 60 mg into the skin every 6 (six) months. Administer in upper arm, thigh, or abdomen     warfarin (COUMADIN) 5 MG tablet TAKE BY MOUTH AS DIRECTED BY THE COUMADIN CLINIC FOR 30 DAYS. 45 tablet 3    Drug Regimen Review Drug regimen was reviewed and remains appropriate with no significant issues identified  Home: Home Living Family/patient expects to be discharged to:: Private residence Living Arrangements: Spouse/significant other Available Help at Discharge: Family Type of Home: House Home Access: Stairs to enter Technical brewer of Steps: 1 Entrance Stairs-Rails: None Home Layout: Able to live on main level with bedroom/bathroom Bathroom Shower/Tub: Chiropodist: Standard Home Equipment: Environmental consultant - 2 wheels, [place] - single point   Functional History: Prior Function Level of Independence: Needs assistance Gait / Transfers Assistance Needed: uses cane in community, furniture walks at times at home. Wears L AFO ADL's / Homemaking Assistance Needed: sponge bathes, husband assists  Functional Status:  Mobility: Bed Mobility Overal bed mobility: Needs Assistance Bed Mobility: Rolling, Sidelying to Sit Rolling: Supervision Sidelying to sit: Supervision General bed mobility comments: pt rolled L with use of rail with RUE, able to come to sitting with increased time and had difficulty moving L side body into position EOB Transfers Overall transfer level: Needs assistance Equipment used:  Rolling walker (2 wheeled) Transfers: Sit to/from Stand Sit to Stand: Min assist General transfer comment: min A to gain balance as pt came to standing, vc's for mgmt of LUE.  Ambulation/Gait Ambulation/Gait assistance: Min assist, Mod assist Gait Distance (Feet): 50 Feet Assistive device: Rolling walker (2 wheeled) Gait Pattern/deviations: Step-to pattern, Decreased weight shift to left, Shuffle, Narrow base of support, Decreased step length - left General Gait Details: pt dragging LLE initially and not wt shifting to L. Was able to increase step length with vc's but still not past RLE. Pt reliant on RW, would not be safe with SPC at this point (her device at home PTA). Needed mod A for turning and mod instructional cues at times due to problem solving deficits Gait velocity: decreased Gait velocity interpretation: <1.31 ft/sec, indicative of household ambulator    ADL: ADL Overall ADL's : Needs assistance/impaired Eating/Feeding: Minimal assistance, Sitting Grooming: Minimal assistance, Sitting Grooming Details (indicate cue type and reason): pt able to complete ADL with increased time and effort;required minA for stability while standing at sink level;brushed teeth  Upper Body Bathing: Min guard, Sitting Lower Body Bathing: Min  guard, Sit to/from stand Lower Body Bathing Details (indicate cue type and reason): pt donned shoes and tied shoe laces,  Upper Body Dressing : Min guard, Sitting Upper Body Dressing Details (indicate cue type and reason): appears she may require assistance for buttons Lower Body Dressing: Minimal assistance, Sit to/from stand Toilet Transfer: Minimal assistance, RW, Ambulation Toilet Transfer Details (indicate cue type and reason): required increased time to sit on the commode Toileting- Clothing Manipulation and Hygiene: Minimal assistance, Sit to/from stand Functional mobility during ADLs: Minimal assistance, Rolling walker General ADL Comments:  demonstrated decreased activity tolerance, decreased use of BUE functionally, and increased fatigue towards end of session  Cognition: Cognition Overall Cognitive Status: Impaired/Different from baseline Orientation Level: Oriented X4 Cognition Arousal/Alertness: Awake/alert Behavior During Therapy: WFL for tasks assessed/performed Overall Cognitive Status: Impaired/Different from baseline Area of Impairment: Problem solving, Following commands Following Commands: Follows multi-step commands with increased time, Follows multi-step commands consistently Problem Solving: Slow processing, Difficulty sequencing, Requires verbal cues, Requires tactile cues General Comments: had difficulty sequencing tasks such as turning around to sit on toilet and sequencing appropriate steps for brushing teeth, required her to audibly discuss steps;requires extended time to process through multistep sequences, verbalized she wanted to use the commode prior to returning to bed, went to bed first, realized prior to sitting and proceeded to toilet, was able to self correct  Physical Exam: Blood pressure 134/84, pulse 71, temperature 98 F (36.7 C), temperature source Oral, resp. rate 16, height 5\' 7"  (1.702 m), weight 60.7 kg, SpO2 96 %. Physical Exam  Vitals reviewed. Constitutional: She appears well-developed and well-nourished.  HENT:  Head: Normocephalic and atraumatic.  Eyes: EOM are normal. Right eye exhibits no discharge. Left eye exhibits no discharge.  Respiratory: Effort normal. No respiratory distress.  GI: She exhibits no distension.  Musculoskeletal:     Comments: No edema or tenderness in extremities  Neurological: She is alert.  Her husband is at bedside.   Makes good eye contact with examiner and follows full commands.   Good awareness of deficits. Motor: RUE: 4+/5 proximal to distal LUE: shoulder abduction, elbow flex/ext 3+-4-/5, hand grip 4/5 LLE: HF, KE 3-/5, ADF limited due to AFO    Skin: Skin is warm and dry.  Psychiatric: She has a normal mood and affect. Her behavior is normal.    Results for orders placed or performed during the hospital encounter of 09/02/19 (from the past 48 hour(s))  Protime-INR     Status: Abnormal   Collection Time: 09/03/19 10:57 AM  Result Value Ref Range   Prothrombin Time 25.9 (H) 11.4 - 15.2 seconds   INR 2.4 (H) 0.8 - 1.2    Comment: (NOTE) INR goal varies based on device and disease states. Performed at [facility], [place] [address] [place], [place] [address]   SARS CORONAVIRUS 2 (TAT 6-24 HRS) Nasopharyngeal Nasopharyngeal Swab     Status: None   Collection Time: 09/03/19 12:00 PM   Specimen: Nasopharyngeal Swab  Result Value Ref Range   SARS Coronavirus 2 NEGATIVE NEGATIVE    Comment: (NOTE) SARS-CoV-2 target nucleic acids are NOT DETECTED. The SARS-CoV-2 RNA is generally detectable in upper and lower respiratory specimens during the acute phase of infection. Negative results do not preclude SARS-CoV-2 infection, do not rule out co-infections with other pathogens, and should not be used as the sole basis for treatment or other patient management decisions. Negative results must be combined with clinical observations, patient history, and epidemiological information. The  expected result is Negative. Fact Sheet for Patients: SugarRoll.be Fact Sheet for Healthcare Providers: https://www.woods-mathews.com/ This test is not yet approved or cleared by the [place] FDA and  has been authorized for detection and/or diagnosis of SARS-CoV-2 by FDA under an Emergency Use Authorization (EUA). This EUA will remain  in effect (meaning this test can be used) for the duration of the COVID-19 declaration under Section 56 4(b)(1) of the Act, 21 U.S.C. section 360bbb-3(b)(1), unless the authorization is terminated or revoked sooner. Performed at [facility], [place] [address] [place], [place] [address]   Heparin level (unfractionated)     Status: Abnormal   Collection Time: 09/03/19  9:46 PM  Result Value Ref Range   Heparin Unfractionated 0.16 (L) 0.30 - 0.70 IU/mL    Comment: (NOTE) If heparin results are below expected values, and patient dosage has  been confirmed, suggest follow up testing of antithrombin III levels. Performed at [facility], [place] [address] [place], [place] [address]   Hemoglobin A1c     Status: Abnormal   Collection Time: 09/04/19  3:59 AM  Result Value Ref Range   Hgb A1c MFr Bld 6.1 (H) 4.8 - 5.6 %    Comment: (NOTE) Pre diabetes:          5.7%-6.4% Diabetes:              >6.4% Glycemic control for   <7.0% adults with diabetes    Mean Plasma Glucose 128.37 mg/dL    Comment: Performed at [facility] [address], [place], [place] [address]  Lipid panel     Status: None   Collection Time: 09/04/19  3:59 AM  Result Value Ref Range   Cholesterol 125 0 - 200 mg/dL   Triglycerides 29 <150 mg/dL   HDL 54 >40 mg/dL   Total CHOL/HDL Ratio 2.3 RATIO   VLDL 6 0 - 40 mg/dL   LDL Cholesterol 65 0 - 99 mg/dL    Comment:        Total Cholesterol/HDL:CHD Risk Coronary Heart Disease Risk Table                     Men   Women  1/2 Average Risk   3.4   3.3  Average Risk       5.0   4.4  2 X Average Risk   9.6   7.1  3 X Average Risk  23.4   11.0        Use the calculated Patient Ratio above and the CHD Risk Table to determine the patient's CHD Risk.        ATP III CLASSIFICATION (LDL):  <100     mg/dL   Optimal  100-129  mg/dL   Near or Above                    Optimal  130-159  mg/dL   Borderline  160-189  mg/dL   High  >190     mg/dL   Very High Performed at [facility] [address], [place] [address]   CBC     Status: Abnormal   Collection Time: 09/04/19  3:59 AM  Result Value Ref Range   WBC 4.1 4.0 - 10.5 K/uL   RBC 3.96 3.87 - 5.11 MIL/uL   Hemoglobin 11.5 (L) 12.0 - 15.0 g/dL    HCT 35.9 (L) 36.0 - 46.0 %   MCV  90.7 80.0 - 100.0 fL   MCH 29.0 26.0 - 34.0 pg   MCHC 32.0 30.0 - 36.0 g/dL   RDW 13.7 11.5 - 15.5 %   Platelets 156 150 - 400 K/uL   nRBC 0.0 0.0 - 0.2 %    Comment: Performed at [facility], [place] [address] [place], [place] [id number]  Basic metabolic panel     Status: Abnormal   Collection Time: 09/04/19  3:59 AM  Result Value Ref Range   Sodium 142 135 - 145 mmol/L   Potassium 4.0 3.5 - 5.1 mmol/L   Chloride 112 (H) 98 - 111 mmol/L   CO2 24 22 - 32 mmol/L   Glucose, Bld 131 (H) 70 - 99 mg/dL   BUN 30 (H) 8 - 23 mg/dL   Creatinine, Ser 0.59 0.44 - 1.00 mg/dL   Calcium 8.5 (L) 8.9 - 10.3 mg/dL   GFR calc non Af Amer >60 >60 mL/min   GFR calc Af Amer >60 >60 mL/min   Anion gap 6 5 - 15    Comment: Performed at [facility] [address], [place], [place] [address]  Protime-INR     Status: Abnormal   Collection Time: 09/04/19  3:59 AM  Result Value Ref Range   Prothrombin Time 23.4 (H) 11.4 - 15.2 seconds   INR 2.1 (H) 0.8 - 1.2    Comment: (NOTE) INR goal varies based on device and disease states. Performed at [facility], [place] [address], [place], [place] [address]   Heparin level (unfractionated)     Status: None   Collection Time: 09/04/19  3:59 AM  Result Value Ref Range   Heparin Unfractionated 0.38 0.30 - 0.70 IU/mL    Comment: (NOTE) If heparin results are below expected values, and patient dosage has  been confirmed, suggest follow up testing of antithrombin III levels. Performed at [facility], [place] [address] [place], [place] [address]   Heparin level (unfractionated)     Status: None   Collection Time: 09/04/19 11:39 AM  Result Value Ref Range   Heparin Unfractionated 0.38 0.30 - 0.70 IU/mL    Comment: (NOTE) If heparin results are below expected values, and patient dosage has  been confirmed, suggest follow up testing of antithrombin III levels. Performed at [facility], [place] [address] [place], [place] [address]   Protime-INR     Status: Abnormal   Collection Time: 09/05/19  3:49 AM  Result Value Ref Range   Prothrombin Time 26.4 (H) 11.4 - 15.2 seconds   INR 2.5 (H) 0.8 - 1.2    Comment: (NOTE) INR goal varies based on device and disease states. Performed at [facility], [place] [address] [place], [place] [address]   Heparin level (unfractionated)     Status: None   Collection Time: 09/05/19  3:49 AM  Result Value Ref Range   Heparin Unfractionated 0.56 0.30 - 0.70 IU/mL    Comment: (NOTE) If heparin results are below expected values, and patient dosage has  been confirmed, suggest follow up testing of antithrombin III levels. Performed at [facility], [place] [address], [place] [address]   CBC     Status: Abnormal   Collection Time: 09/05/19  3:49 AM  Result Value Ref Range   WBC 3.9 (L) 4.0 - 10.5 K/uL   RBC 4.29 3.87 - 5.11 MIL/uL   Hemoglobin 12.6 12.0 - 15.0 g/dL  HCT 38.9 36.0 - 46.0 %   MCV 90.7 80.0 - 100.0 fL   MCH 29.4 26.0 - 34.0 pg   MCHC 32.4 30.0 - 36.0 g/dL   RDW 13.7 11.5 - 15.5 %   Platelets 146 (L) 150 - 400 K/uL   nRBC 0.0 0.0 - 0.2 %    Comment: Performed at [facility] [address], [place], [place] [address]  Magnesium     Status: None   Collection Time: 09/05/19  3:49 AM  Result Value Ref Range   Magnesium 1.9 1.7 - 2.4 mg/dL    Comment: Performed at [facility] [address], [place], [place] [address]  Phosphorus     Status: Abnormal   Collection Time: 09/05/19  3:49 AM  Result Value Ref Range   Phosphorus 1.9 (L) 2.5 - 4.6 mg/dL    Comment: Performed at [facility] [address], [place], [place] [address]   Ct Angio Head W Or Wo Contrast  Result Date: 09/04/2019 CLINICAL DATA:  Stroke workup.  Left-sided weakness EXAM: CT ANGIOGRAPHY HEAD AND NECK TECHNIQUE: Multidetector CT imaging of the head and neck was performed using the standard protocol during bolus administration of  intravenous contrast. Multiplanar CT image reconstructions and MIPs were obtained to evaluate the vascular anatomy. Carotid stenosis measurements (when applicable) are obtained utilizing NASCET criteria, using the distal internal carotid diameter as the denominator. CONTRAST:  39mL OMNIPAQUE IOHEXOL 350 MG/ML SOLN COMPARISON:  09/24/2017 FINDINGS: CTA NECK FINDINGS Aortic arch: Atherosclerotic plaque.  Two vessel branching. Right carotid system: Vessels are smooth and widely patent. Limited atheromatous changes. Left carotid system: Vessels are smooth and widely patent. Minimal atherosclerotic plaque at the ICA bulb. Vertebral arteries: Proximal subclavian atherosclerosis without stenosis or ulceration. Slight left vertebral artery dominance. Both vertebral arteries are smooth and widely patent to the dura. Skeleton: Degenerative disc narrowing and facet spurring. Remote right parietal craniotomy, unremarkable. Other neck: No incidental mass or swelling. Upper chest: Negative Review of the MIP images confirms the above findings CTA HEAD FINDINGS Anterior circulation: Vessels are smooth and widely patent. No branch occlusion, aneurysm, or beading. Posterior circulation: Vertebrobasilar arteries are smooth and widely patent. Unremarkable cerebellar branches. No branch occlusion, beading, or aneurysm. Venous sinuses: Unremarkable in the arterial phase Anatomic variants: Fetal type left PCA Review of the MIP images confirms the above findings IMPRESSION: 1. No emergent finding. 2. Mild for age atherosclerosis with no flow limiting stenosis or ulceration affecting major vessels. Electronically Signed   By: Monte Fantasia M.D.   On: 09/04/2019 06:59   Ct Angio Neck W Or Wo Contrast  Result Date: 09/04/2019 CLINICAL DATA:  Stroke workup.  Left-sided weakness EXAM: CT ANGIOGRAPHY HEAD AND NECK TECHNIQUE: Multidetector CT imaging of the head and neck was performed using the standard protocol during bolus administration  of intravenous contrast. Multiplanar CT image reconstructions and MIPs were obtained to evaluate the vascular anatomy. Carotid stenosis measurements (when applicable) are obtained utilizing NASCET criteria, using the distal internal carotid diameter as the denominator. CONTRAST:  51mL OMNIPAQUE IOHEXOL 350 MG/ML SOLN COMPARISON:  09/24/2017 FINDINGS: CTA NECK FINDINGS Aortic arch: Atherosclerotic plaque.  Two vessel branching. Right carotid system: Vessels are smooth and widely patent. Limited atheromatous changes. Left carotid system: Vessels are smooth and widely patent. Minimal atherosclerotic plaque at the ICA bulb. Vertebral arteries: Proximal subclavian atherosclerosis without stenosis or ulceration. Slight left vertebral artery dominance. Both vertebral arteries are smooth and widely patent to the  dura. Skeleton: Degenerative disc narrowing and facet spurring. Remote right parietal craniotomy, unremarkable. Other neck: No incidental mass or swelling. Upper chest: Negative Review of the MIP images confirms the above findings CTA HEAD FINDINGS Anterior circulation: Vessels are smooth and widely patent. No branch occlusion, aneurysm, or beading. Posterior circulation: Vertebrobasilar arteries are smooth and widely patent. Unremarkable cerebellar branches. No branch occlusion, beading, or aneurysm. Venous sinuses: Unremarkable in the arterial phase Anatomic variants: Fetal type left PCA Review of the MIP images confirms the above findings IMPRESSION: 1. No emergent finding. 2. Mild for age atherosclerosis with no flow limiting stenosis or ulceration affecting major vessels. Electronically Signed   By: Monte Fantasia M.D.   On: 09/04/2019 06:59       Medical Problem List and Plan: 1.  Increasing left side weakness secondary to minor acute extension along the anterior margin of the old infarct right parietal cortical and subcortical affecting the deep white matter and small focus adjacent cortical  brain  Admit to CIR 2.  Antithrombotics: -DVT/anticoagulation: Chronic Coumadin with INR goal 3-3.5  -antiplatelet therapy: Aspirin 81 mg daily 3. Pain Management: Tylenol as needed 4. Mood: Provide emotional support  -antipsychotic agents: N/A 5. Neuropsych: This patient is capable of making decisions on her own behalf. 6. Skin/Wound Care: Routine skin checks 7. Fluids/Electrolytes/Nutrition: Routine in and outs. BMP ordered for tomorrow AM.  8.  Hypertension.  Norvasc 10 mg daily  Monitor with increased mobility.  9.  History of mitral valve replacement.  Continue Coumadin 10.  Hyperlipidemia.  Cont Lipitor 11. Prediabetes  Monitor with increased mobility 12. Thrombocytopenia  CBC ordered for tomorrow AM  Cathlyn Parsons, PA-C 09/05/2019  I have personally performed a face to face diagnostic evaluation, including, but not limited to relevant history and physical exam findings, of this patient and developed relevant assessment and plan.  Additionally, I have reviewed and concur with the physician assistant's documentation above.  Delice Lesch, MD, ABPMR

## 2019-09-05 NOTE — Plan of Care (Signed)
  Problem: Education: Goal: Knowledge of General Education information will improve Description: Including pain rating scale, medication(s)/side effects and non-pharmacologic comfort measures Outcome: Progressing   Problem: Health Behavior/Discharge Planning: Goal: Ability to manage health-related needs will improve Outcome: Progressing   Problem: Clinical Measurements: Goal: Ability to maintain clinical measurements within normal limits will improve Outcome: Progressing Goal: Will remain free from infection Outcome: Progressing Goal: Diagnostic test results will improve Outcome: Progressing Goal: Respiratory complications will improve Outcome: Progressing Goal: Cardiovascular complication will be avoided Outcome: Progressing   Problem: Activity: Goal: Risk for activity intolerance will decrease Outcome: Progressing   Problem: Nutrition: Goal: Adequate nutrition will be maintained Outcome: Progressing   Problem: Coping: Goal: Level of anxiety will decrease Outcome: Progressing   Problem: Elimination: Goal: Will not experience complications related to bowel motility Outcome: Progressing Goal: Will not experience complications related to urinary retention Outcome: Progressing   Problem: Pain Managment: Goal: General experience of comfort will improve Outcome: Progressing   Problem: Safety: Goal: Ability to remain free from injury will improve Outcome: Progressing   Problem: Skin Integrity: Goal: Risk for impaired skin integrity will decrease Outcome: Progressing   Problem: Education: Goal: Knowledge of disease or condition will improve Outcome: Progressing Goal: Knowledge of secondary prevention will improve Outcome: Progressing Goal: Knowledge of patient specific risk factors addressed and post discharge goals established will improve Outcome: Progressing   Problem: Health Behavior/Discharge Planning: Goal: Ability to manage health-related needs will  improve Outcome: Progressing   Problem: Ischemic Stroke/TIA Tissue Perfusion: Goal: Complications of ischemic stroke/TIA will be minimized Outcome: Progressing   Ival Bible, BSN, RN

## 2019-09-05 NOTE — Progress Notes (Addendum)
Per telemetry pt had 1.52 "pause" SEB at 2302, telemetry informed me of this at 0217 and Dr. Lennox Grumbles informed at this time as well.

## 2019-09-05 NOTE — Progress Notes (Signed)
STROKE TEAM PROGRESS NOTE       INTERVAL HISTORY Her husband is at the bedside.  She is feeling much better and improved.  She will be transferred to inpatient rehab later today.  She has no new complaints.   OBJECTIVE Vitals:   09/05/19 1624  BP: 123/69  Pulse: 68  Resp: 19  Temp: 97.7 F (36.5 C)  SpO2: 97%    CBC:  Recent Labs  Lab 09/02/19 2039 09/04/19 0359 09/05/19 0349  WBC 4.4 4.1 3.9*  NEUTROABS 2.7  --   --   HGB 13.4 11.5* 12.6  HCT 41.4 35.9* 38.9  MCV 91.2 90.7 90.7  PLT 181 156 146*    Basic Metabolic Panel:  Recent Labs  Lab 09/02/19 2039 09/04/19 0359 09/05/19 0349  NA 140 142  --   K 3.6 4.0  --   CL 106 112*  --   CO2 23 24  --   GLUCOSE 135* 131*  --   BUN 34* 30*  --   CREATININE 0.78 0.59  --   CALCIUM 9.3 8.5*  --   MG  --   --  1.9  PHOS  --   --  1.9*    Lipid Panel:     Component Value Date/Time   CHOL 125 09/04/2019 0359   TRIG 29 09/04/2019 0359   HDL 54 09/04/2019 0359   CHOLHDL 2.3 09/04/2019 0359   VLDL 6 09/04/2019 0359   LDLCALC 65 09/04/2019 0359   HgbA1c:  Lab Results  Component Value Date   HGBA1C 6.1 (H) 09/04/2019   Urine Drug Screen:     Component Value Date/Time   LABOPIA NONE DETECTED 09/24/2017 0240   COCAINSCRNUR NONE DETECTED 09/24/2017 0240   LABBENZ NONE DETECTED 09/24/2017 0240   AMPHETMU NONE DETECTED 09/24/2017 0240   THCU NONE DETECTED 09/24/2017 0240   LABBARB NONE DETECTED 09/24/2017 0240    Alcohol Level No results found for: ETH  IMAGING   Ct Angio Head W Or Wo Contrast  Result Date: 09/04/2019 CLINICAL DATA:  Stroke workup.  Left-sided weakness EXAM: CT ANGIOGRAPHY HEAD AND NECK TECHNIQUE: Multidetector CT imaging of the head and neck was performed using the standard protocol during bolus administration of intravenous contrast. Multiplanar CT image reconstructions and MIPs were obtained to evaluate the vascular anatomy. Carotid stenosis measurements (when applicable) are obtained  utilizing NASCET criteria, using the distal internal carotid diameter as the denominator. CONTRAST:  36mL OMNIPAQUE IOHEXOL 350 MG/ML SOLN COMPARISON:  09/24/2017 FINDINGS: CTA NECK FINDINGS Aortic arch: Atherosclerotic plaque.  Two vessel branching. Right carotid system: Vessels are smooth and widely patent. Limited atheromatous changes. Left carotid system: Vessels are smooth and widely patent. Minimal atherosclerotic plaque at the ICA bulb. Vertebral arteries: Proximal subclavian atherosclerosis without stenosis or ulceration. Slight left vertebral artery dominance. Both vertebral arteries are smooth and widely patent to the dura. Skeleton: Degenerative disc narrowing and facet spurring. Remote right parietal craniotomy, unremarkable. Other neck: No incidental mass or swelling. Upper chest: Negative Review of the MIP images confirms the above findings CTA HEAD FINDINGS Anterior circulation: Vessels are smooth and widely patent. No branch occlusion, aneurysm, or beading. Posterior circulation: Vertebrobasilar arteries are smooth and widely patent. Unremarkable cerebellar branches. No branch occlusion, beading, or aneurysm. Venous sinuses: Unremarkable in the arterial phase Anatomic variants: Fetal type left PCA Review of the MIP images confirms the above findings IMPRESSION: 1. No emergent finding. 2. Mild for age atherosclerosis with no flow limiting stenosis or ulceration  affecting major vessels. Electronically Signed   By: Monte Fantasia M.D.   On: 09/04/2019 06:59   Ct Angio Neck W Or Wo Contrast  Result Date: 09/04/2019 CLINICAL DATA:  Stroke workup.  Left-sided weakness EXAM: CT ANGIOGRAPHY HEAD AND NECK TECHNIQUE: Multidetector CT imaging of the head and neck was performed using the standard protocol during bolus administration of intravenous contrast. Multiplanar CT image reconstructions and MIPs were obtained to evaluate the vascular anatomy. Carotid stenosis measurements (when applicable) are  obtained utilizing NASCET criteria, using the distal internal carotid diameter as the denominator. CONTRAST:  14mL OMNIPAQUE IOHEXOL 350 MG/ML SOLN COMPARISON:  09/24/2017 FINDINGS: CTA NECK FINDINGS Aortic arch: Atherosclerotic plaque.  Two vessel branching. Right carotid system: Vessels are smooth and widely patent. Limited atheromatous changes. Left carotid system: Vessels are smooth and widely patent. Minimal atherosclerotic plaque at the ICA bulb. Vertebral arteries: Proximal subclavian atherosclerosis without stenosis or ulceration. Slight left vertebral artery dominance. Both vertebral arteries are smooth and widely patent to the dura. Skeleton: Degenerative disc narrowing and facet spurring. Remote right parietal craniotomy, unremarkable. Other neck: No incidental mass or swelling. Upper chest: Negative Review of the MIP images confirms the above findings CTA HEAD FINDINGS Anterior circulation: Vessels are smooth and widely patent. No branch occlusion, aneurysm, or beading. Posterior circulation: Vertebrobasilar arteries are smooth and widely patent. Unremarkable cerebellar branches. No branch occlusion, beading, or aneurysm. Venous sinuses: Unremarkable in the arterial phase Anatomic variants: Fetal type left PCA Review of the MIP images confirms the above findings IMPRESSION: 1. No emergent finding. 2. Mild for age atherosclerosis with no flow limiting stenosis or ulceration affecting major vessels. Electronically Signed   By: Monte Fantasia M.D.   On: 09/04/2019 06:59     Transthoracic Echocardiogram   Recent Results (from the past 43800 hour(s))  ECHOCARDIOGRAM COMPLETE   Collection Time: 09/04/19  1:20 PM  Result Value   Weight 2,140.8   Height 67   BP 126/76   Narrative     ECHOCARDIOGRAM REPORT       Patient Name:   [name] Date of Exam: 09/04/2019 Medical Rec #:  [id number]      Height:       67.0 in Accession #:    [id number]     Weight:       133.8 lb Date of Birth:   June 15, 1946      BSA:          1.70 m Patient Age:    73 years       BP:           126/76 mmHg Patient Gender: F              HR:           65 bpm. Exam Location:  Inpatient  Procedure: 2D Echo  Indications:    Stroke 434.91/I163.9   History:        Patient has prior history of Echocardiogram examinations, most                 recent 05/12/2016. Mitral Valve: A mechanical valve is present in                 the mitral position. 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 Hx stroke.   Sonographer:    Clayton Lefort RDCS (AE) Referring Phys: [id number] [place]  IMPRESSIONS    1. Left ventricular ejection fraction, by visual estimation, is 55 to 60%. The left ventricle has normal function. There is no left ventricular hypertrophy.  2. Abnormal septal motion consistent with post-operative status.  3. Global right ventricle has normal systolic function.The right ventricular size is normal. No increase in right ventricular wall thickness.  4. Left atrial size was severely dilated.  5. Right atrial size was mildly dilated.  6. Small pericardial effusion.  7. The pericardial effusion is circumferential.  8. The mitral valve has been repaired/replaced. No evidence of mitral valve regurgitation.  9. The tricuspid valve is normal in structure. Tricuspid valve regurgitation mild-moderate. 10. The aortic valve is normal in structure. Aortic valve regurgitation was not visualized by color flow Doppler. Mild aortic valve sclerosis without stenosis. 11. There is Mild calcification of the aortic valve. 12. There is Mild thickening of the aortic valve. 13. The pulmonic valve was normal in structure. Pulmonic valve regurgitation is trivial by color flow Doppler. 14. Normal pulmonary artery systolic pressure. 15. Mitral gradients are similar to the previous study.  FINDINGS  Left Ventricle: Left ventricular ejection fraction, by visual estimation, is 55 to 60%. The left  ventricle has normal function. No evidence of left ventricular regional wall motion abnormalities. There is no left ventricular hypertrophy. Abnormal  (paradoxical) septal motion consistent with post-operative status.  Right Ventricle: The right ventricular size is normal. No increase in right ventricular wall thickness. Global RV systolic function is has normal systolic function. The tricuspid regurgitant velocity is 2.44 m/s, and with an assumed right atrial pressure  of 3 mmHg, the estimated right ventricular systolic pressure is normal at 26.7 mmHg.  Left Atrium: Left atrial size was severely dilated.  Right Atrium: Right atrial size was mildly dilated  Pericardium: A small pericardial effusion is present. The pericardial effusion is circumferential.  Mitral Valve: The mitral valve has been repaired/replaced. MV peak gradient, 7.3 mmHg. No evidence of mitral valve regurgitation. There is a bright linear structure connecting the medial papillary muscle to the mitral annulus, probably a surgically  placed neo-chord (maybe attempted repair prior to replacement with a mechanical prosthesis.  Tricuspid Valve: The tricuspid valve is normal in structure. Tricuspid valve regurgitation mild-moderate by color flow Doppler.  Aortic Valve: The aortic valve is normal in structure.. There is Mild thickening and Mild calcification of the aortic valve. Aortic valve regurgitation was not visualized by color flow Doppler. Mild aortic valve sclerosis is present, with no evidence of  aortic valve stenosis. There is Mild thickening of the aortic valve. Mild calcification. Aortic valve mean gradient measures 2.0 mmHg. Aortic valve peak gradient measures 4.2 mmHg. Aortic valve area, by VTI measures 2.06 cm.  Pulmonic Valve: The pulmonic valve was normal in structure. Pulmonic valve regurgitation is trivial by color flow Doppler.  Aorta: The aortic root and ascending aorta are structurally normal, with no  evidence of dilitation.  IAS/Shunts: No atrial level shunt detected by color flow Doppler.     LEFT VENTRICLE PLAX 2D LVIDd:         3.48 cm  Diastology LVIDs:         2.52 cm  LV e' lateral:   8.27 cm/s LV PW:         1.12 cm  LV E/e' lateral: 14.0 LV IVS:        1.06 cm  LV e' medial:    8.70 cm/s LVOT diam:     1.80 cm  LV E/e'  medial:  13.3 LV SV:         27 ml LV SV Index:   16.20 LVOT Area:     2.54 cm    RIGHT VENTRICLE            IVC RV Basal diam:  2.95 cm    IVC diam: 1.64 cm RV S prime:     9.03 cm/s TAPSE (M-mode): 2.0 cm  LEFT ATRIUM             Index       RIGHT ATRIUM           Index LA diam:        3.50 cm 2.05 cm/m  RA Area:     21.10 cm LA Vol (A2C):   82.2 ml 48.22 ml/m RA Volume:   64.20 ml  37.66 ml/m LA Vol (A4C):   69.4 ml 40.71 ml/m LA Biplane Vol: 75.8 ml 44.47 ml/m  AORTIC VALVE AV Area (Vmax):    1.85 cm AV Area (Vmean):   1.87 cm AV Area (VTI):     2.06 cm AV Vmax:           103.00 cm/s AV Vmean:          70.900 cm/s AV VTI:            0.224 m AV Peak Grad:      4.2 mmHg AV Mean Grad:      2.0 mmHg LVOT Vmax:         74.80 cm/s LVOT Vmean:        52.200 cm/s LVOT VTI:          0.181 m LVOT/AV VTI ratio: 0.81   AORTA Ao Root diam: 3.30 cm  MITRAL VALVE                         TRICUSPID VALVE MV Area (PHT): 3.93 cm              TR Peak grad:   23.7 mmHg MV Peak grad:  7.3 mmHg              TR Vmax:        251.00 cm/s MV Mean grad:  3.0 mmHg MV Vmax:       1.35 m/s              SHUNTS MV Vmean:      81.8 cm/s             Systemic VTI:  0.18 m MV VTI:        0.33 m                Systemic Diam: 1.80 cm MV PHT:        55.97 msec MV Decel Time: 193 msec MV E velocity: 116.00 cm/s 103 cm/s MV A velocity: 75.10 cm/s  70.3 cm/s MV E/A ratio:  1.54        1.5    Mihai Croitoru MD Electronically signed by Sanda Klein MD Signature Date/Time: 09/04/2019/2:55:29 PM       Final     *Note: Due to a large number of results  and/or encounters for the requested time period, some results have not been displayed. A complete set of results can be found in Results Review.   ECG - SR rate 70 BPM. (See cardiology reading for complete details)  PHYSICAL EXAM Blood pressure 123/69, pulse 68, temperature 97.7 F (36.5  C), resp. rate 19, SpO2 97 %.  GEN: Appears well-developed and well-nourished elderly Caucasian lady.  Psych: Affect appropriate to situation Eyes: No scleral injection Head: Normocephalic.  Cardiovascular: Normal rate and regular rhythm.  Respiratory: Effort normal, non-labored breathing GI: Soft.  No distension. There is no tenderness.  Skin: Warm, dry  Neuro: Mental Status: Patient is awake, alert, oriented to person, place, month, year, and situation. Patient is able to give a clear and coherent history. No signs of aphasia or neglect Cranial Nerves: II: Visual Fields are full.  III,IV, VI: EOMI without ptosis or diploplia. Pupils equal, round and reactive to light V: Facial sensation is symmetric to temperature VII: Facial movement is symmetric.  Possible slight decreased nasolabial fold on the left VIII: hearing is intact to voice X: Palat elevates symmetrically XI: Shoulder shrug is asymmetrical on the left XII: tongue is midline without atrophy or fasciculations.  Motor: 5/5 on the right, left arm is held in 45 degree contraction along with weakness and shoulder abduction, bicep flexion and tricep extension.  Diminished fine finger movements on the left.  Orbits right over left upper extremity.. Left leg has 5/5 strength.  Bobbing but no true drift with left leg Sensory: Sensation is symmetric to light touch and temperature in the arms and legs. Deep Tendon Reflexes: 2+ in upper and lower extremities on the right, 3+ in upper extremity on the left, 3+ knee jerk on the left 2+ ankle jerk on the left with sustained clonus of the ankle.  Plantars: Upgoing on the left downgoing on the  right Cerebellar: Dysmetria on the left arm with finger-to-nose and ataxia heel-to-shin on the left  HOME MEDICATIONS:  Medications Prior to Admission  Medication Sig Dispense Refill  . amLODipine (NORVASC) 10 MG tablet Take 10 mg daily by mouth.    Marland Kitchen antiseptic oral rinse (BIOTENE) LIQD 15 mLs by Mouth Rinse route as needed for dry mouth.    Marland Kitchen aspirin EC 81 MG tablet Take 81 mg by mouth daily.     Marland Kitchen atorvastatin (LIPITOR) 20 MG tablet Take 20 mg by mouth daily.    . calcium carbonate 1250 MG capsule Take 1,200 mg by mouth at bedtime.     . cholecalciferol (VITAMIN D) 1000 UNITS tablet Take 2,000 Units daily by mouth.     . denosumab (PROLIA) 60 MG/ML SOLN injection Inject 60 mg into the skin every 6 (six) months. Administer in upper arm, thigh, or abdomen    . docusate sodium (COLACE) 100 MG capsule Take 100 mg at bedtime by mouth.     . pantoprazole (PROTONIX) 40 MG tablet Take 1 tablet (40 mg total) by mouth daily. 30 tablet 0  . potassium & sodium phosphates (PHOS-NAK) 280-160-250 MG PACK Take 1 packet by mouth 4 (four) times daily -  before meals and at bedtime for 1 day. 4 packet 0  . Propylene Glycol-Glycerin (SOOTHE) 0.6-0.6 % SOLN Place 1 drop into both eyes 2 (two) times daily as needed (dry eyes).    . warfarin (COUMADIN) 5 MG tablet TAKE BY MOUTH AS DIRECTED BY THE COUMADIN CLINIC FOR 30 DAYS. 45 tablet 3      HOSPITAL MEDICATIONS:  . [START ON 09/06/2019] amLODipine  10 mg Oral Daily  . [START ON 09/06/2019] aspirin EC  81 mg Oral Daily  . [START ON 09/06/2019] atorvastatin  20 mg Oral Daily  . docusate sodium  100 mg Oral QHS  . pantoprazole  40 mg Oral QHS  .  Warfarin - Pharmacist Dosing Inpatient   Does not apply q1800    ALLERGIES Allergies  Allergen Reactions  . Zoloft [Sertraline] Hives, Itching and Rash   ASSESSMENT/PLAN [name] is a 73 y.o. female with history of multiple strokes in the past, but good recover, mRS1. On 10/26 she presents to the  hospital with increased hemiparesis of the left arm and leg.  MRI does confirm acute extension along the anterior margin of old infarction.  The exact etiology is less clear to me.  It is possible she had a transient hypoperfusion event as a new embolic event to the same distribution as the previous stroke would be unlikely..  The strokes are relatively small, and given the high risk of mechanical heart valve, I think it is reasonable to continue anticoagulation  Stroke: unsure etiology; unusual pattern within the bed of the old infarct possibly failure of collaterals.. 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 Appears to have some hemorrhagic component as well.   MRA head   CTA H&N - mild atherosclerosis, no stenosis  CT Perfusion not done  Carotid Doppler - not done  2D Echo - 55% EF, abnormal septal motion d/t post op effect. LA severely dilated, RA mild dilation, sm pericardial effusion, mild calcification/thickening of aortic valve.   Sars Corona Virus 2  neg  LDL - 65    Component Value Date/Time   LDLCALC 65 09/04/2019 0359    HgbA1c - 6.1  UDS neg  VTE prophylaxis - on anticoagulation Diet  Diet Order            Diet Heart Room service appropriate? Yes with Assist; Fluid consistency: Thin  Diet effective now              ASA + Coumadin prior to admission, now on same  Patient counseled to be compliant with her antithrombotic medications  Ongoing aggressive stroke risk factor management  Therapy recommendations:  pending  Disposition:  Pending  Hypertension  Home BP meds: Norvasc  Current BP meds: Norvasc  Stable . Permissive hypertension (OK if < 220/120) but gradually  normalize in next few days . Long-term BP goal normotensive  Hyperlipidemia  Home Lipid lowering medication:Lipitor 20mg   LDL 65, goal < 100  Current lipid lowering medication: Ok to continue home lipitor dosing, no role in high intensity statin since she is so far under goal. Will stop 80mg  dosing.  Continue statin at discharge  Diabetes  Home diabetic meds: none  Current diabetic meds:none  HgbA1c 6.1, goal < 7.0 No results for input(s): GLUCAP in the last 72 hours.  Other Stroke Risk Factors  Advanced age  Hx stroke/TIA  Coronary artery disease  Valve replacement  Other Active Problems  Hospital day # 0   .  She presented with increased left hemiparesis and MRI scan shows tiny punctate infarct in the bed of the old large infarct.  Her INR was suboptimal at 2.1 for her mechanical heart valve.  Agree with IV heparin bridge till INR is between 3-3 0.5.  Long discussion patient and husband at the bedside and answered questions.  Await transfer to inpatient rehab later today.  Stroke team will sign off.  Follow-up as an outpatient with stroke clinic in 6 weeks. Antony Contras, MD Medical Director [facility] Pager: [phone] 09/05/2019 4:27 PM To contact Stroke Continuity provider, please refer to [email or web address] After hours, contact General Neurology

## 2019-09-05 NOTE — Progress Notes (Addendum)
Pt arrived to unit in bed via nurses from 3W. Belonging are at beside.  Pt has continuous fluid and a bag of  herpain in left forearm. Pt is A&OX4 , no c/o of pain noted.D/C of fluids

## 2019-09-05 NOTE — Discharge Summary (Addendum)
Discharge Summary  [name] [id number] DOB: 1945/12/15  PCP: Haywood Pao, MD  Admit date: 09/02/2019 Discharge date: 09/05/2019  Time spent: 35 minutes  Recommendations for Outpatient Follow-up:  1. Follow-up with neurology 2. Follow-up with cardiology 3. Follow-up with your primary care provider 4. Continue physical therapy 5. Continue fall precautions  Discharge Diagnoses:  Active Hospital Problems   Diagnosis Date Noted   CVA (cerebral vascular accident) ([place]) 09/03/2019   History of stroke    Hyperlipemia 08/23/2017   HTN (hypertension) 04/13/2016   H/O mitral valve replacement with mechanical valve 10/16/2015    Resolved Hospital Problems  No resolved problems to display.    Discharge Condition: Stable  Diet recommendation: Resume previous diet.  Vitals:   09/05/19 0755 09/05/19 1147  BP: 134/84 137/81  Pulse: 71 75  Resp: 16 16  Temp: 98 F (36.7 C) 98 F (36.7 C)  SpO2: 96% 99%    History of present illness:   [name] a 73 y.o.femalewith medical history significant ofhypertension, diabetes mellitus type 2, mechanical mitral valve replacement on anticoagulation, and multipleCVA with residual left-sided weakness. She presents with worsening worsening weakness of her left hand and foot starting yesterday morning when she woke up. At baseline patient has residual left-sided weakness from previous strokes with reports of left foot drop for which she wears a orthotic shoe. Normally only uses a cane outside of the house to ambulate. Yesterday morning when she woke up she reported that her left leg was clumsier than normal. While eating she reported having difficulty utilizing her left hand more so than usual. Her husband evaluated her and noted that she had no facial droop and her speech was normal. Patient denies having any headache, change in vision, chest pain, palpitations, shortness of breath, cough, nausea, vomiting,  dysuria, or pain. She reports that she has been taking daily 81 mg aspirin in addition to warfarin as prescribed, but notes that they have had to adjust due to subtherapeutic and supratherapeutic INRs.    ED Course:On admission into the emergency department patient was noted to have stable vital signs. Labs significant for BUN 34, creatinine 0.78, and INR 2.9. CT scan of the brain did not show any acute abnormalities. However, MRI revealed suspicion for minor acute extension along the anterior margin of the old infarct.Case was discussed with Dr. Katherine Roan who we will see the patient in the emergency department. TR[facility] called to admit. Covid test pending  Persistent left sided weakness.  Seen by PT OT with recommendation for inpatient rehab.  CIR consulted for possible inpatient rehab admission.  09/05/19: Patient was seen and examined at her bedside this morning.  No acute events overnight.  PT OT assessed and recommended CIR.  Admitted to CIR and will continue physical rehab.  Vital signs and labs reviewed and are stable.  Hospital Course:  Principal Problem:   CVA (cerebral vascular accident) [facility]) Active Problems:   H/O mitral valve replacement with mechanical valve   HTN (hypertension)   Hyperlipemia   History of stroke  Acute extension of old infarction CVA, history of CVAs with residual deficit: Patient presents with acute worsening of her left-sided weakness. MRI revealing suspicion for minor acute extension of old infarction. Neurology have been consulted. Question if fluctuating INR is a contributing factor.  Independently reviewed MRI brain which showed old hemorrhagic infarction right parietal cortical and subcortical with encephalomalacia Ongoing stroke work-up PT OT assessment recommended CIR Hemoglobin A1c 6.1 goal less than 7.0  LDL 65 goal less than 70 TTE normal LVEF, no evidence of thrombus, no PFO. On aspirin 81 mg daily and Coumadin -Continue  recommendations per stroke team  Neurology recommendations: Stroke: unsure etiology; unusual pattern within the bed of the old infarct possibly failure of collaterals.. 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 Appears to have some hemorrhagic component as well.   MRA head   CTA H&N - mild atherosclerosis, no stenosis  CT Perfusion not done  Carotid Doppler - not done  2D Echo - 55% EF, abnormal septal motion d/t post op effect. LA severely dilated, RA mild dilation, sm pericardial effusion, mild calcification/thickening of aortic valve.   Sars Corona Virus 2  neg  LDL - 65 Labs (Brief)           Component Value Date/Time   LDLCALC 65 09/04/2019 0359       HgbA1c - 6.1  UDS neg  VTE prophylaxis - on anticoagulation   S/pmechanical mitral valve replacement,subtherapeutic INR: -Initially Heparin drip to bridge andCoumadin per pharmacy  INR 2.5, subtherapeutic on 09/05/2019 Pharmacy consulted for Lovenox bridge for DC planning to inpatient rehab.  Essential hypertension:  Blood pressure is currently at goal Continue amlodipine 10 mg daily Continue to monitor vital signs  Hyperlipidemia: At home patient on atorvastatin 20 mg daily. LDL 65 with goal less than 70. -Continue Lipitor 20 mg daily, home dose as recommended by neurology  GERD -Continue pharmacy substitution for Prilosec  Hypophosphatemia Phos 1.9 Replete as indicated  Ambulatory dysfunction post CVA PT OT recommended CIR CIR consulted for possible admission to inpatient rehab. Continue fall precautions Admitted at inpatient rehab on 09/05/2019 Medically stable for admission to inpatient rehab.   Code  Status:Full   Consults called:Neurology/stroke team   Discharge Exam: BP 137/81 (BP Location: Right Arm)    Pulse 75    Temp 98 F (36.7 C) (Oral)    Resp 16    Ht 5\' 7"  (1.702 m)    Wt 60.7 kg    SpO2 99%    BMI 20.96 kg/m   General: 73 y.o. year-old female well developed well nourished in no acute distress.  Alert and oriented x4.  Cardiovascular: Regular rate and rhythm with no rubs or gallops.  No thyromegaly or JVD noted.    Respiratory: Clear to auscultation with no wheezes or rales. Good inspiratory effort.  Abdomen: Soft nontender nondistended with normal bowel sounds x4 quadrants.  Musculoskeletal: No lower extremity edema. 2/4 pulses in all 4 extremities.  4 out of 5 strength in left upper extremity.  3 out of 5 strength in left lower extremity.  Psychiatry: Mood is appropriate for condition and setting  Discharge Instructions You were cared for by a hospitalist during your hospital stay. If you have any questions about your discharge medications or the care you received while you were in the hospital after you are discharged, you can call the unit and asked to speak with the hospitalist on call if the hospitalist that took care of you is not available. Once you are discharged, your primary care physician will handle any further medical issues. Please note that NO REFILLS for any discharge medications will be authorized once  you are discharged, as it is imperative that you return to your primary care physician (or establish a relationship with a primary care physician if you do not have one) for your aftercare needs so that they can reassess your need for medications and monitor your lab values.   Allergies as of 09/05/2019      Reactions   Zoloft [sertraline] Hives, Itching, Rash      Medication List    STOP taking these medications   acetaminophen 500 MG tablet Commonly known as: TYLENOL   omeprazole 20 MG capsule Commonly known as: PRILOSEC Replaced by:  pantoprazole 40 MG tablet     TAKE these medications   amLODipine 10 MG tablet Commonly known as: NORVASC Take 10 mg daily by mouth.   antiseptic oral rinse Liqd 15 mLs by Mouth Rinse route as needed for dry mouth.   aspirin EC 81 MG tablet Take 81 mg by mouth daily.   atorvastatin 20 MG tablet Commonly known as: LIPITOR Take 20 mg by mouth daily.   calcium carbonate 1250 MG capsule Take 1,200 mg by mouth at bedtime.   cholecalciferol 1000 units tablet Commonly known as: VITAMIN D Take 2,000 Units daily by mouth.   denosumab 60 MG/ML Soln injection Commonly known as: PROLIA Inject 60 mg into the skin every 6 (six) months. Administer in upper arm, thigh, or abdomen   docusate sodium 100 MG capsule Commonly known as: COLACE Take 100 mg at bedtime by mouth.   pantoprazole 40 MG tablet Commonly known as: PROTONIX Take 1 tablet (40 mg total) by mouth daily. Replaces: omeprazole 20 MG capsule   potassium & sodium phosphates 280-160-250 MG Pack Commonly known as: PHOS-NAK Take 1 packet by mouth 4 (four) times daily -  before meals and at bedtime for 1 day.   Soothe 0.6-0.6 % Soln Generic drug: Propylene Glycol-Glycerin Place 1 drop into both eyes 2 (two) times daily as needed (dry eyes).   warfarin 5 MG tablet Commonly known as: COUMADIN Take as directed. If you are unsure how to take this medication, talk to your nurse or doctor. Original instructions: TAKE BY MOUTH AS DIRECTED BY THE COUMADIN CLINIC FOR 30 DAYS. What changed: additional instructions            Durable Medical Equipment  (From admission, onward)         Start     Ordered   09/04/19 1520  For home use only DME Walker rolling  Once    Comments: 5" wheels  Question:  Patient needs a walker to treat with the following condition  Answer:  Ambulatory dysfunction   09/04/19 1519   09/04/19 1519  For home use only DME 3 n 1  Once     09/04/19 1518         Allergies  Allergen Reactions    Zoloft [Sertraline] Hives, Itching and Rash   Follow-up Information    Tisovec, [name], MD. Call in 1 day(s).   Specialty: Internal Medicine Why: Please call for a post hospital follow-up appointment. Contact information: [place] [address] [phone]        Nahser, Wonda Cheng, MD .   Specialty: Cardiology Contact information: [address] [place] [address] [phone]        [place]. Call in 1 day(s).   Why: Please call for a post hospital follow-up appointment. Contact information: [place]     [place]  [place] [phone]           The results of significant diagnostics from this hospitalization (including imaging, microbiology, ancillary and laboratory) are listed below for reference.    Significant Diagnostic Studies: Ct Angio Head W Or Wo Contrast  Result Date: 09/04/2019 CLINICAL DATA:  Stroke workup.  Left-sided weakness EXAM: CT ANGIOGRAPHY HEAD AND NECK TECHNIQUE: Multidetector CT imaging of the head and neck was performed using the standard protocol during bolus administration of intravenous contrast. Multiplanar CT image reconstructions and MIPs were obtained to evaluate the vascular anatomy. Carotid stenosis measurements (when applicable) are obtained utilizing NASCET criteria, using the distal internal carotid diameter as the denominator. CONTRAST:  58mL OMNIPAQUE IOHEXOL 350 MG/ML SOLN COMPARISON:  09/24/2017 FINDINGS: CTA NECK FINDINGS Aortic arch: Atherosclerotic plaque.  Two vessel branching. Right carotid system: Vessels are smooth and widely patent. Limited atheromatous changes. Left carotid system: Vessels are smooth and widely patent. Minimal atherosclerotic plaque at the ICA bulb. Vertebral arteries: Proximal subclavian atherosclerosis without stenosis or ulceration. Slight left vertebral artery dominance. Both vertebral arteries are smooth and  widely patent to the dura. Skeleton: Degenerative disc narrowing and facet spurring. Remote right parietal craniotomy, unremarkable. Other neck: No incidental mass or swelling. Upper chest: Negative Review of the MIP images confirms the above findings CTA HEAD FINDINGS Anterior circulation: Vessels are smooth and widely patent. No branch occlusion, aneurysm, or beading. Posterior circulation: Vertebrobasilar arteries are smooth and widely patent. Unremarkable cerebellar branches. No branch occlusion, beading, or aneurysm. Venous sinuses: Unremarkable in the arterial phase Anatomic variants: Fetal type left PCA Review of the MIP images confirms the above findings IMPRESSION: 1. No emergent finding. 2. Mild for age atherosclerosis with no flow limiting stenosis or ulceration affecting major vessels. Electronically Signed   By: Monte Fantasia M.D.   On: 09/04/2019 06:59   Ct Head Wo Contrast  Result Date: 09/02/2019 CLINICAL DATA:  Left-sided weakness EXAM: CT HEAD WITHOUT CONTRAST TECHNIQUE: Contiguous axial images were obtained from the base of the skull through the vertex without intravenous contrast. COMPARISON:  09/24/2017 FINDINGS: Brain: Stable encephalomalacia is noted in the right frontal parietal region unchanged from the prior exam. Overlying craniotomy defect is seen. Scattered chronic atrophic changes are noted. Prior left thalamic infarct is again noted stable. Chronic cerebellar lacunar infarcts are noted as well. Vascular: No hyperdense vessel or unexpected calcification. Skull: Craniotomy defect is again noted as described. Sinuses/Orbits: No acute finding. Other: None. IMPRESSION: Chronic changes without acute abnormality. Electronically Signed   By: Inez Catalina M.D.   On: 09/02/2019 21:31   Ct Angio Neck W Or Wo Contrast  Result Date: 09/04/2019 CLINICAL DATA:  Stroke workup.  Left-sided weakness EXAM: CT ANGIOGRAPHY HEAD AND NECK TECHNIQUE: Multidetector CT imaging of the head and neck  was performed using the standard protocol during bolus administration of intravenous contrast. Multiplanar CT image reconstructions and MIPs were obtained to evaluate the vascular anatomy. Carotid stenosis measurements (when applicable) are obtained utilizing NASCET criteria, using the distal internal carotid diameter as the denominator. CONTRAST:  60mL OMNIPAQUE IOHEXOL 350 MG/ML SOLN COMPARISON:  09/24/2017 FINDINGS: CTA NECK FINDINGS Aortic arch: Atherosclerotic plaque.  Two vessel branching. Right carotid system: Vessels are smooth and widely patent. Limited atheromatous changes. Left carotid system: Vessels are smooth and widely patent. Minimal atherosclerotic plaque at the ICA bulb. Vertebral arteries: Proximal subclavian atherosclerosis without stenosis or ulceration. Slight left vertebral artery dominance. Both vertebral arteries are smooth and widely patent to the dura. Skeleton: Degenerative  disc narrowing and facet spurring. Remote right parietal craniotomy, unremarkable. Other neck: No incidental mass or swelling. Upper chest: Negative Review of the MIP images confirms the above findings CTA HEAD FINDINGS Anterior circulation: Vessels are smooth and widely patent. No branch occlusion, aneurysm, or beading. Posterior circulation: Vertebrobasilar arteries are smooth and widely patent. Unremarkable cerebellar branches. No branch occlusion, beading, or aneurysm. Venous sinuses: Unremarkable in the arterial phase Anatomic variants: Fetal type left PCA Review of the MIP images confirms the above findings IMPRESSION: 1. No emergent finding. 2. Mild for age atherosclerosis with no flow limiting stenosis or ulceration affecting major vessels. Electronically Signed   By: Monte Fantasia M.D.   On: 09/04/2019 06:59   Mr Brain Wo Contrast  Result Date: 09/03/2019 CLINICAL DATA:  Hypertension, diabetes and hyperlipidemia. Stroke with left hemiparesis. Worsening weakness on the left. Symptoms began yesterday.  EXAM: MRI HEAD WITHOUT CONTRAST TECHNIQUE: Multiplanar, multiecho pulse sequences of the brain and surrounding structures were obtained without intravenous contrast. COMPARISON:  Head CT 09/02/2019.  MRI 09/24/2017. FINDINGS: Brain: There has been extensive old hemorrhagic infarction in the right parietal cortical and subcortical brain. Overlying old craniotomy. The area is affected by encephalomalacia and gliosis. There are some chronic foci of tissue restricted diffusion that I do not think relate to acute infarction. Along the anterior margin, there is a more linear focus of restricted diffusion within the adjacent white matter and a small focus in the adjacent cortex which could represent a tiny extension of the infarction. No other acute infarction suspected. Elsewhere, there chronic small-vessel ischemic changes of the pons. There are numerous old small vessel cerebellar infarctions. Cerebral hemispheres show old small vessel infarctions affecting the thalami and chronic small-vessel ischemic changes the deep white matter without evidence of other large vessel territory insult. No sign recent hemorrhage, hydrocephalus or extra-axial fluid collection. I suspect that there is a tiny meningioma along the left side of the posterior falx in the posterior parietal region, no more than 2-3 mm in thickness. This is unchanged since 2018. Vascular: Major vessels at the base of the brain show flow. Skull and upper cervical spine: Otherwise negative Sinuses/Orbits: Clear/normal Other: None IMPRESSION: Old hemorrhagic infarction in the right parietal cortical and subcortical brain which has progressed to atrophy, encephalomalacia and gliosis. Suspicion of a minor acute extension along the anterior margin of the old infarction affecting the deep white matter and a small focus adjacent cortical brain. Extensive old ischemic changes elsewhere throughout the brain as outlined above. 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 Electronically Signed   By: Nelson Chimes M.D.   On: 09/03/2019 08:34    Microbiology: Recent Results (from the past 240 hour(s))  SARS CORONAVIRUS 2 (TAT 6-24 HRS) Nasopharyngeal Nasopharyngeal Swab     Status: None   Collection Time: 09/03/19 12:00 PM   Specimen: Nasopharyngeal Swab  Result Value Ref Range Status   SARS Coronavirus 2 NEGATIVE NEGATIVE Final    Comment: (NOTE) SARS-CoV-2 target nucleic acids are NOT DETECTED. The SARS-CoV-2 RNA is generally detectable in upper and lower respiratory specimens during the acute phase of infection. Negative results do not preclude SARS-CoV-2 infection, do not rule out co-infections with other pathogens, and should not be used as the sole basis for treatment or other patient management decisions. Negative results must be combined with clinical observations, patient history, and epidemiological information. The expected result is Negative. Fact Sheet for Patients: SugarRoll.be  Fact Sheet for Healthcare Providers: https://www.woods-mathews.com/ This test is not yet approved or cleared by the [place] FDA and  has been authorized for detection and/or diagnosis of SARS-CoV-2 by FDA under an Emergency Use Authorization (EUA). This EUA will remain  in effect (meaning this test can be used) for the duration of the COVID-19 declaration under Section 56 4(b)(1) of the Act, 21 U.S.C. section 360bbb-3(b)(1), unless the authorization is terminated or revoked sooner. Performed at [facility], [place] [address] [place], [place] [address]      Labs: Basic Metabolic Panel: Recent Labs  Lab 09/02/19 2039 09/04/19 0359 09/05/19 0349  NA 140 142  --   K 3.6 4.0  --   CL 106 112*  --   CO2 23 24  --   GLUCOSE 135* 131*  --   BUN 34* 30*  --   CREATININE 0.78 0.59  --   CALCIUM 9.3 8.5*  --   MG  --   --  1.9    PHOS  --   --  1.9*   Liver Function Tests: Recent Labs  Lab 09/02/19 2039  AST 22  ALT 21  ALKPHOS 54  BILITOT 0.6  PROT 6.9  ALBUMIN 4.4   No results for input(s): LIPASE, AMYLASE in the last 168 hours. No results for input(s): AMMONIA in the last 168 hours. CBC: Recent Labs  Lab 09/02/19 2039 09/04/19 0359 09/05/19 0349  WBC 4.4 4.1 3.9*  NEUTROABS 2.7  --   --   HGB 13.4 11.5* 12.6  HCT 41.4 35.9* 38.9  MCV 91.2 90.7 90.7  PLT 181 156 146*   Cardiac Enzymes: No results for input(s): CKTOTAL, CKMB, CKMBINDEX, TROPONINI in the last 168 hours. BNP: BNP (last 3 results) No results for input(s): BNP in the last 8760 hours.  ProBNP (last 3 results) No results for input(s): PROBNP in the last 8760 hours.  CBG: No results for input(s): GLUCAP in the last 168 hours.     Signed:  Kayleen Memos, MD [facility] 09/05/2019, 12:22 PM

## 2019-09-05 NOTE — Consult Note (Signed)
   [facility] CM Inpatient Consult   09/05/2019  [name] 09-Jan-1946 [id number]    Patientreviewed for potential [facility] care management servicesneededas a benefit fromher Medicare/NextGen plan, with 16% risk score for unplanned readmission.   Chart review andMD notes showas follows: Patient presented with acute worsening of her left-sided weakness. MRI revealing suspicion for minor acute extension of old infarction. (Acute extension of old infarction CVA, history of CVAs with residual deficit)  Primary care provider isDr. Domenick Gong with [employer], listed to provide transition of care.  Brief chartreviewrevealsPT/ OT notes recommending patientfor CIR([facility]). Transition of care CM note states that patient is discharging to [place] (CIR) today.  Will followforprogress anddisposition, and ifthere are any changesin dispositionand needs for appropriate community follow-up,please referpatient to [facility] care management.  Of note, [facility] Care Management services does not replace or interfere with any servicesarranged by transition of care CM or social work.   Forquestions and additional information, pleasecontact:  Fantasia Jinkins A. Aislin Onofre, BSN, RN-BC [facility] Liaison Cell: [phone]

## 2019-09-05 NOTE — Progress Notes (Signed)
Jamse Arn, MD  Physician  Physical Medicine and Rehabilitation  PMR Pre-admission  Signed  Date of Service:  09/05/2019 12:03 PM      Related encounter: ED to Hosp-Admission (Discharged) from 09/02/2019 in [facility]      Signed         Show:Clear all [x] Manual[x] Template[x] Copied  Added by: [x] Posey Pronto, Domenick Bookbinder, MD[x] Michel Santee, PT  [] Hover for details PMR Admission Coordinator Pre-Admission Assessment  Patient: [name] is an 73 y.o., female MRN: [id number] DOB: September 07, 1946 Height: 5\' 7"  (170.2 cm) Weight: 60.7 kg  Insurance Information HMO:     PPO:      PCP:      IPA:      80/20:      OTHER:  PRIMARY: Medicare A and B       Policy#: [id number]      Subscriber: patient CM Name:       Phone#:      Fax#:  Pre-Cert#: verified eligibility on passport onesource       Employer: n/a Benefits:  Phone #:      Name:  Eff. Date: A and B 05/09/11     Deduct: $1408      Out of Pocket Max: n/a      Life Max: n/a CIR: 100%      SNF: 20 full days Outpatient:      Co-Pay:  Home Health:       Co-Pay:  DME:      Co-Pay:  Providers:  SECONDARY:       Policy#:       Subscriber:  CM Name:       Phone#:      Fax#:  Pre-Cert#:       Employer:  Benefits:  Phone #:      Name:  Eff. Date:      Deduct:       Out of Pocket Max:       Life Max:  CIR:       SNF:  Outpatient:      Co-Pay:  Home Health:       Co-Pay:  DME:      Co-Pay:   Medicaid Application Date:       Case Manager:  Disability Application Date:       Case Worker:   The "Data Collection Information Summary" for patients in Inpatient Rehabilitation Facilities with attached "Privacy Act [place] [facility] Records" was provided and verbally reviewed with: Patient  Emergency Contact Information         Contact Information    Name Relation Home Work Mobile   [name] Spouse [phone]  [phone]      Current Medical History  Patient Admitting Diagnosis: CVA    History of Present Illness: [name] is a 73 year old right-handed female history of hypertension, diabetes mellitus, mechanical mitral valve replacement on anticoagulationas well as low-dose aspirin,multiple CVAs with residual left-sided weakness. Presented 09/03/2019 with increasing left side weakness. Denied any chest pain or shortness of breath. INR on admission of 2.9. Cranial CT scan showed chronic changes without acute abnormality. CT angiogram of head and neck with no emergent findings. MRI showed old hemorrhagic infarct in the right parietal cortical and subcortical brain which has progressed atrophy, encephalomalacia and gliosis. Suspicion of minor acute extension along the anterior margin of the old infarction affecting the deep white matter and small focus adjacent cortical brain. Tiny stable meningioma along the left posterior falx without mass-effect without change  since 2018. Echocardiogram with ejection fraction of 60% without emboli.Neurology follow-up patient remains on low-dose aspirin and Coumadin prior to admission. Tolerating a regular consistency diet. Therapy evaluations completed and patient was recommended for a comprehensive rehab program. Complete NIHSS TOTAL: 3  Patient's medical record from [facility] has been reviewed by the rehabilitation admission coordinator and physician.  Past Medical History      Past Medical History:  Diagnosis Date  . Abnormality of gait 09/07/2016  . Allergy   . Diabetes mellitus without complication [facility])    Patient denies this - notes history of glucose intolerance  . Hemiparesis and alteration of sensations as late effects of stroke ([place]) 09/07/2016  . History of pneumonia 1997  . S/P MVR (mitral valve replacement)    Mechanical mitral valve replacement at age 51 (done in [place])  // echo 7/17: EF 55-60, normal wall motion, bileaflet mechanical mitral valve prosthesis functioning normally, mild LAE,  mildly reduced RVSF, small pericardial effusion  . Stroke [facility]) 1997, 2013, 2015    Family History   family history includes Cancer in her mother; Heart disease in her mother; Hyperlipidemia in her mother; Hypertension in her father and mother; Stroke in her father.  Prior Rehab/Hospitalizations Has the patient had prior rehab or hospitalizations prior to admission? No  Has the patient had major surgery during 100 days prior to admission? No              Current Medications  Current Facility-Administered Medications:  .  0.9 %  sodium chloride infusion, , Intravenous, Continuous, Smith, Rondell A, MD, Last Rate: 75 mL/hr at 09/05/19 0916 .  acetaminophen (TYLENOL) tablet 650 mg, 650 mg, Oral, Q4H PRN, 650 mg at 09/04/19 1155 **OR** acetaminophen (TYLENOL) 160 MG/5ML solution 650 mg, 650 mg, Per Tube, Q4H PRN **OR** acetaminophen (TYLENOL) suppository 650 mg, 650 mg, Rectal, Q4H PRN, Smith, Rondell A, MD .  albuterol (PROVENTIL) (2.5 MG/3ML) 0.083% nebulizer solution 2.5 mg, 2.5 mg, Nebulization, Q6H PRN, Smith, Rondell A, MD .  amLODipine (NORVASC) tablet 10 mg, 10 mg, Oral, Daily, Tamala Julian, Rondell A, MD, 10 mg at 09/05/19 0910 .  antiseptic oral rinse (BIOTENE) solution 15 mL, 15 mL, Mouth Rinse, PRN, Smith, Rondell A, MD .  aspirin EC tablet 81 mg, 81 mg, Oral, Daily, Smith, Rondell A, MD, 81 mg at 09/05/19 0910 .  atorvastatin (LIPITOR) tablet 20 mg, 20 mg, Oral, Daily, Metzger-Cihelka, Desiree, NP, 20 mg at 09/05/19 0910 .  docusate sodium (COLACE) capsule 100 mg, 100 mg, Oral, QHS, Smith, Rondell A, MD, 100 mg at 09/04/19 2130 .  heparin ADULT infusion 100 units/mL (25000 units/238mL sodium chloride 0.45%), 850 Units/hr, Intravenous, Continuous, Smith, Rondell A, MD, Last Rate: 8.5 mL/hr at 09/05/19 0916, 850 Units/hr at 09/05/19 0916 .  pantoprazole (PROTONIX) EC tablet 40 mg, 40 mg, Oral, QHS, Smith, Rondell A, MD, 40 mg at 09/04/19 2130 .  polyvinyl alcohol (LIQUIFILM TEARS) 1.4  % ophthalmic solution 1 drop, 1 drop, Both Eyes, BID PRN, Smith, Rondell A, MD .  sodium phosphate 30 mmol in dextrose 5 % 250 mL infusion, 30 mmol, Intravenous, Once, Hall, Carole N, DO .  warfarin (COUMADIN) tablet 7.5 mg, 7.5 mg, Oral, ONCE-1800, Masters, Jake Church, RPH .  Warfarin - Pharmacist Dosing Inpatient, , Does not apply, q1800, Rumbarger, Valeda Malm, RPH  Patients Current Diet:     Diet Order                  Diet  Heart Room service appropriate? Yes with Assist; Fluid consistency: Thin  Diet effective now               Precautions / Restrictions Precautions Precautions: Fall Restrictions Weight Bearing Restrictions: No   Has the patient had 2 or more falls or a fall with injury in the past year? No  Prior Activity Level  Prior Functional Level Self Care: Did the patient need help bathing, dressing, using the toilet or eating? Needed some help  Indoor Mobility: Did the patient need assistance with walking from room to room (with or without device)? Independent  Stairs: Did the patient need assistance with internal or external stairs (with or without device)? Needed some help  Functional Cognition: Did the patient need help planning regular tasks such as shopping or remembering to take medications? Bath / Equipment Home Assistive Devices/Equipment: Eyeglasses, Radio producer (specify quad or straight) Home Equipment: Walker - 2 wheels, Cane - single point  Prior Device Use: Indicate devices/aids used by the patient prior to current illness, exacerbation or injury? cane  Current Functional Level Cognition  Overall Cognitive Status: Impaired/Different from baseline Orientation Level: Oriented X4 Following Commands: Follows multi-step commands with increased time, Follows multi-step commands consistently General Comments: had difficulty sequencing tasks such as turning around to sit on toilet and sequencing appropriate steps  for brushing teeth, required her to audibly discuss steps;requires extended time to process through multistep sequences, verbalized she wanted to use the commode prior to returning to bed, went to bed first, realized prior to sitting and proceeded to toilet, was able to self correct    Extremity Assessment (includes Sensation/Coordination)  Upper Extremity Assessment: RUE deficits/detail, LUE deficits/detail RUE Deficits / Details: grossly 3+/5 MMT;demonstrated slight tremor during functional activities, reports this is baseline, impacts coordination during activities RUE Sensation: WNL RUE Coordination: decreased fine motor LUE Deficits / Details: strength grossly 3/5;decreased coordination with finger-to-nose;decreased grip strength impacting ability to open containers;decreased fine motor coordination and rapid alternating movements;demonstrates compensatory strategies for decreased fine motor coordination during bilateral tasks, uses RUE dominantly LUE Sensation: WNL LUE Coordination: decreased fine motor, decreased gross motor  Lower Extremity Assessment: Defer to PT evaluation LLE Deficits / Details: reported increased weakness and control following session, alerted RN who reported she was going to see her.  LLE Sensation: decreased light touch, decreased proprioception LLE Coordination: decreased fine motor, decreased gross motor    ADLs  Overall ADL's : Needs assistance/impaired Eating/Feeding: Minimal assistance, Sitting Grooming: Minimal assistance, Sitting Grooming Details (indicate cue type and reason): pt able to complete ADL with increased time and effort;required minA for stability while standing at sink level;brushed teeth  Upper Body Bathing: Min guard, Sitting Lower Body Bathing: Min guard, Sit to/from stand Lower Body Bathing Details (indicate cue type and reason): pt donned shoes and tied shoe laces,  Upper Body Dressing : Min guard, Sitting Upper Body Dressing Details  (indicate cue type and reason): appears she may require assistance for buttons Lower Body Dressing: Minimal assistance, Sit to/from stand Toilet Transfer: Minimal assistance, RW, Ambulation Toilet Transfer Details (indicate cue type and reason): required increased time to sit on the commode Toileting- Clothing Manipulation and Hygiene: Minimal assistance, Sit to/from stand Functional mobility during ADLs: Minimal assistance, Rolling walker General ADL Comments: demonstrated decreased activity tolerance, decreased use of BUE functionally, and increased fatigue towards end of session    Mobility  Overal bed mobility: Needs Assistance Bed Mobility: Rolling, Sidelying to Sit Rolling: Supervision Sidelying to  sit: Supervision General bed mobility comments: pt rolled L with use of rail with RUE, able to come to sitting with increased time and had difficulty moving L side body into position EOB    Transfers  Overall transfer level: Needs assistance Equipment used: Rolling walker (2 wheeled) Transfers: Sit to/from Stand Sit to Stand: Min assist General transfer comment: min A to gain balance as pt came to standing, vc's for mgmt of LUE.     Ambulation / Gait / Stairs / Wheelchair Mobility  Ambulation/Gait Ambulation/Gait assistance: Min assist, Mod assist Gait Distance (Feet): 50 Feet Assistive device: Rolling walker (2 wheeled) Gait Pattern/deviations: Step-to pattern, Decreased weight shift to left, Shuffle, Narrow base of support, Decreased step length - left General Gait Details: pt dragging LLE initially and not wt shifting to L. Was able to increase step length with vc's but still not past RLE. Pt reliant on RW, would not be safe with SPC at this point (her device at home PTA). Needed mod A for turning and mod instructional cues at times due to problem solving deficits Gait velocity: decreased Gait velocity interpretation: <1.31 ft/sec, indicative of household ambulator     Posture / Balance Dynamic Sitting Balance Sitting balance - Comments: balance limitation noted in sitting, decreased ability to achieve balance with perturbation to L Balance Overall balance assessment: Needs assistance Sitting-balance support: Single extremity supported Sitting balance-Leahy Scale: Fair Sitting balance - Comments: balance limitation noted in sitting, decreased ability to achieve balance with perturbation to L Standing balance support: Bilateral upper extremity supported Standing balance-Leahy Scale: Poor Standing balance comment: unable to safely stand without support today    Special needs/care consideration BiPAP/CPAP no CPM no Continuous Drip IV no Dialysis no        Days n/a Life Vest no Oxygen no Special Bed no Trach Size no Wound Vac (area) n/a      Location  Skin ecchymosis to R knee              Bowel mgmt: continent, last BM 10/26 Bladder mgmt: continent Diabetic mgmt: no Behavioral consideration no Chemo/radiation no   Previous Home Environment (from acute therapy documentation) Living Arrangements: Spouse/significant other Available Help at Discharge: Family Type of Home: House Home Layout: Able to live on main level with bedroom/bathroom Home Access: Stairs to enter Entrance Stairs-Rails: None Entrance Stairs-Number of Steps: 1 Bathroom Shower/Tub: Chiropodist: Standard Home Care Services: Yes Type of Home Care Services: [place] (if known): private pay  Discharge Living Setting Does the patient have any problems obtaining your medications?: No  Social/Family/Support Systems  Goals/Additional Needs  Decrease burden of Care through IP rehab admission: n/a  Possible need for SNF placement upon discharge: Not anticipated.   Patient Condition: I have reviewed medical records from [facility], spoken with CM, and patient and spouse. I discussed via phone for inpatient  rehabilitation assessment.  Patient will benefit from ongoing PT and OT, can actively participate in 3 hours of therapy a day 5 days of the week, and can make measurable gains during the admission.  Patient will also benefit from the coordinated team approach during an Inpatient Acute Rehabilitation admission.  The patient will receive intensive therapy as well as Rehabilitation physician, nursing, social worker, and care management interventions.  Due to safety, medication administration, pain management and patient education the patient requires 24 hour a day rehabilitation nursing.  The patient is currently min to mod assist with mobility and  basic ADLs.  Discharge setting and therapy post discharge at home with home health is anticipated.  Patient has agreed to participate in the Acute Inpatient Rehabilitation Program and will admit today.  Preadmission Screen Completed By:  Michel Santee, PT, DPT 09/05/2019 12:03 PM ______________________________________________________________________   Discussed status with Dr. Posey Pronto on 09/05/19  at 12:08 PM  and received approval for admission today.  Admission Coordinator:  Michel Santee, PT, DPT time 12:08 PM Sudie Grumbling 09/05/19    Assessment/Plan: Diagnosis: acute on chronic right parietal cortical and subcortical brain  1. Does the need for close, 24 hr/day Medical supervision in concert with the patient's rehab needs make it unreasonable for this patient to be served in a less intensive setting? Yes  2. Co-Morbidities requiring supervision/potential complications: hypertension, diabetes mellitus, mechanical mitral valve, multiple CVAs with residual left-sided weakness 3. Due to safety, disease management and patient education, does the patient require 24 hr/day rehab nursing? Yes 4. Does the patient require coordinated care of a physician, rehab nurse, PT, OT, to address physical and functional deficits in the context of the above medical  diagnosis(es)? Yes Addressing deficits in the following areas: balance, endurance, locomotion, strength, transferring, bathing, dressing, toileting and psychosocial support 5. Can the patient actively participate in an intensive therapy program of at least 3 hrs of therapy 5 days a week? Yes 6. The potential for patient to make measurable gains while on inpatient rehab is excellent 7. Anticipated functional outcomes upon discharge from inpatient rehab: supervision and min assist PT, supervision and min assist OT, n/a SLP 8. Estimated rehab length of stay to reach the above functional goals is: 10-14 days. 9. Anticipated discharge destination: Home 10. Overall Rehab/Functional Prognosis: good   MD Signature: Delice Lesch, MD, ABPMR        Revision History

## 2019-09-05 NOTE — Progress Notes (Addendum)
[facility] for Heparin/Warfarin  Indication: mechanical MVR   Patient Measurements: Height: 5\' 7"  (170.2 cm) Weight: 133 lb 12.8 oz (60.7 kg) IBW/kg (Calculated) : 61.6  Vital Signs: Temp: 98 F (36.7 C) (10/28 0755) Temp Source: Oral (10/28 0755) BP: 134/84 (10/28 0755) Pulse Rate: 71 (10/28 0755)  Labs: Recent Labs    09/02/19 2039 09/03/19 1057  09/04/19 0359 09/04/19 1139 09/05/19 0349  HGB 13.4  --   --  11.5*  --  12.6  HCT 41.4  --   --  35.9*  --  38.9  PLT 181  --   --  156  --  146*  APTT 44*  --   --   --   --   --   LABPROT 29.6* 25.9*  --  23.4*  --  26.4*  INR 2.9* 2.4*  --  2.1*  --  2.5*  HEPARINUNFRC  --   --    < > 0.38 0.38 0.56  CREATININE 0.78  --   --  0.59  --   --    < > = values in this interval not displayed.    Estimated Creatinine Clearance: 60 mL/min (by C-G formula based on SCr of 0.59 mg/dL).  Assessment: 73 year old female presented to the ED with worsening left sided weakness. She is on chronic warfarin for history of a mechanical valve. INR was below goal upon admission, still remains subtherapeutic today at 2.4. Will continue with warfarin and bridge with IV heparin until INR is therapeutic (3-3.5). Baseline CBC is WNL and no bleeding noted. Heparin level is therapeutic on current rate of heparin.   PTA warfarin dose: 7.5 mg q Mon/Fri, 5 mg on all other days   Goal of Therapy:  Heparin level 0.3-0.5 units/ml INR 3-3.5 Monitor platelets by anticoagulation protocol: Yes   Plan:  Cont heparin at 850 units/hr Warfarin 7.5 mg po x 1  Monitor daily heparin level, INR, and CBC    Enrico Eaddy, Jake Church 09/05/2019 7:58 AM    Addendum - Stop heparin at 1700, administer Lovenox 60 mg po bid beginning at 1800 - Discontinue Lovenox when INR is >/ 3   Harvel Quale 09/05/2019 12:57 PM

## 2019-09-05 NOTE — Plan of Care (Signed)
  Problem: Consults Goal: RH STROKE PATIENT EDUCATION Description: See Patient Education module for education specifics  Outcome: Progressing Goal: Nutrition Consult-if indicated Outcome: Progressing   Problem: RH BOWEL ELIMINATION Goal: RH STG MANAGE BOWEL WITH ASSISTANCE Description: STG Manage Bowel with supervision Assistance. Outcome: Progressing Goal: RH STG MANAGE BOWEL W/MEDICATION W/ASSISTANCE Description: STG Manage Bowel with Medication with supervision Assistance. Outcome: Progressing   Problem: RH BLADDER ELIMINATION Goal: RH STG MANAGE BLADDER WITH ASSISTANCE Description: STG Manage Bladder With supervision Assistance Outcome: Progressing   Problem: RH SKIN INTEGRITY Goal: RH STG SKIN FREE OF INFECTION/BREAKDOWN Description: Skin to remain free from breakdown while on rehab with min assist. Outcome: Progressing Goal: RH STG MAINTAIN SKIN INTEGRITY WITH ASSISTANCE Description: STG Maintain Skin Integrity With min Assistance. Outcome: Progressing Goal: RH STG ABLE TO PERFORM INCISION/WOUND CARE W/ASSISTANCE Description: STG Able To Perform Incision/Wound Care With min Assistance. Outcome: Progressing   Problem: RH SAFETY Goal: RH STG ADHERE TO SAFETY PRECAUTIONS W/ASSISTANCE/DEVICE Description: STG Adhere to Safety Precautions With supervision Assistance and appropriate assistive Device. Outcome: Progressing   Problem: RH PAIN MANAGEMENT Goal: RH STG PAIN MANAGED AT OR BELOW PT'S PAIN GOAL Description: <3 on a 0-10 pain scale. Outcome: Progressing   Problem: RH KNOWLEDGE DEFICIT Goal: RH STG INCREASE KNOWLEDGE OF HYPERTENSION Description: Patient will be able to demonstrate knowledge of HTN, medications, BP parameters, and dietary restrictions with min assist from rehab staff. Outcome: Progressing Goal: RH STG INCREASE KNOWLEGDE OF HYPERLIPIDEMIA Description: Patient will demonstrate knowledge of HLD medications and dietary restrictions with min assist from  rehab staff. Outcome: Progressing Goal: RH STG INCREASE KNOWLEDGE OF STROKE PROPHYLAXIS Description: Patient will demonstrate knowledge of stroke prevention medications with min assist from rehab staff. Outcome: Progressing

## 2019-09-05 NOTE — PMR Pre-admission (Signed)
PMR Admission Coordinator Pre-Admission Assessment  Patient: [name] is an 73 y.o., female MRN: [id number] DOB: July 05, 1946 Height: 5\' 7"  (170.2 cm) Weight: 60.7 kg  Insurance Information HMO:     PPO:      PCP:      IPA:      80/20:      OTHER:  PRIMARY: Medicare A and B       Policy#: [id number]      Subscriber: patient CM Name:       Phone#:      Fax#:  Pre-Cert#: verified eligibility on passport onesource       Employer: n/a Benefits:  Phone #:      Name:  Eff. Date: A and B 05/09/11     Deduct: $1408      Out of Pocket Max: n/a      Life Max: n/a CIR: 100%      SNF: 20 full days Outpatient:      Co-Pay:  Home Health:       Co-Pay:  DME:      Co-Pay:  Providers:  SECONDARY:       Policy#:       Subscriber:  CM Name:       Phone#:      Fax#:  Pre-Cert#:       Employer:  Benefits:  Phone #:      Name:  Eff. Date:      Deduct:       Out of Pocket Max:       Life Max:  CIR:       SNF:  Outpatient:      Co-Pay:  Home Health:       Co-Pay:  DME:      Co-Pay:   Medicaid Application Date:       Case Manager:  Disability Application Date:       Case Worker:   The "Data Collection Information Summary" for patients in Inpatient Rehabilitation Facilities with attached "Privacy Act [place] Records" was provided and verbally reviewed with: Patient  Emergency Contact Information Contact Information    Name Relation Home Work Mobile   [place] Spouse [phone]  [phone]      Current Medical History  Patient Admitting Diagnosis: CVA   History of Present Illness: [name] is a 73 year old right-handed female history of hypertension, diabetes mellitus, mechanical mitral valve replacement on anticoagulation as well as low-dose aspirin, multiple CVAs with residual left-sided weakness.  Presented 09/03/2019 with increasing left side weakness.  Denied any chest pain or shortness of breath.  INR on admission of 2.9.  Cranial CT scan showed chronic changes  without acute abnormality.  CT angiogram of head and neck with no emergent findings.  MRI showed old hemorrhagic infarct in the right parietal cortical and subcortical brain which has progressed atrophy, encephalomalacia and gliosis.  Suspicion of minor acute extension along the anterior margin of the old infarction affecting the deep white matter and small focus adjacent cortical brain.  Tiny stable meningioma along the left posterior falx without mass-effect without change since 2018.  Echocardiogram with ejection fraction of 60% without emboli.  Neurology follow-up patient remains on low-dose aspirin and Coumadin prior to admission.  Tolerating a regular consistency diet.  Therapy evaluations completed and patient was recommended for a comprehensive rehab program. Complete NIHSS TOTAL: 3  Patient's medical record from [facility] has been reviewed by the rehabilitation admission coordinator and physician.  Past Medical History  Past Medical History:  Diagnosis Date  . Abnormality of gait 09/07/2016  . Allergy   . Diabetes mellitus without complication [facility])    Patient denies this - notes history of glucose intolerance  . Hemiparesis and alteration of sensations as late effects of stroke ([place]) 09/07/2016  . History of pneumonia 1997  . S/P MVR (mitral valve replacement)    Mechanical mitral valve replacement at age 72 (done in [place])  // echo 7/17: EF 55-60, normal wall motion, bileaflet mechanical mitral valve prosthesis functioning normally, mild LAE, mildly reduced RVSF, small pericardial effusion  . Stroke [facility]) 1997, 2013, 2015    Family History   family history includes Cancer in her mother; Heart disease in her mother; Hyperlipidemia in her mother; Hypertension in her father and mother; Stroke in her father.  Prior Rehab/Hospitalizations Has the patient had prior rehab or hospitalizations prior to admission? No  Has the patient had major surgery during 100 days prior to  admission? No   Current Medications  Current Facility-Administered Medications:  .  0.9 %  sodium chloride infusion, , Intravenous, Continuous, Smith, Rondell A, MD, Last Rate: 75 mL/hr at 09/05/19 0916 .  acetaminophen (TYLENOL) tablet 650 mg, 650 mg, Oral, Q4H PRN, 650 mg at 09/04/19 1155 **OR** acetaminophen (TYLENOL) 160 MG/5ML solution 650 mg, 650 mg, Per Tube, Q4H PRN **OR** acetaminophen (TYLENOL) suppository 650 mg, 650 mg, Rectal, Q4H PRN, Smith, Rondell A, MD .  albuterol (PROVENTIL) (2.5 MG/3ML) 0.083% nebulizer solution 2.5 mg, 2.5 mg, Nebulization, Q6H PRN, Smith, Rondell A, MD .  amLODipine (NORVASC) tablet 10 mg, 10 mg, Oral, Daily, Tamala Julian, Rondell A, MD, 10 mg at 09/05/19 0910 .  antiseptic oral rinse (BIOTENE) solution 15 mL, 15 mL, Mouth Rinse, PRN, Smith, Rondell A, MD .  aspirin EC tablet 81 mg, 81 mg, Oral, Daily, Smith, Rondell A, MD, 81 mg at 09/05/19 0910 .  atorvastatin (LIPITOR) tablet 20 mg, 20 mg, Oral, Daily, Metzger-Cihelka, Desiree, NP, 20 mg at 09/05/19 0910 .  docusate sodium (COLACE) capsule 100 mg, 100 mg, Oral, QHS, Smith, Rondell A, MD, 100 mg at 09/04/19 2130 .  heparin ADULT infusion 100 units/mL (25000 units/222mL sodium chloride 0.45%), 850 Units/hr, Intravenous, Continuous, Smith, Rondell A, MD, Last Rate: 8.5 mL/hr at 09/05/19 0916, 850 Units/hr at 09/05/19 0916 .  pantoprazole (PROTONIX) EC tablet 40 mg, 40 mg, Oral, QHS, Smith, Rondell A, MD, 40 mg at 09/04/19 2130 .  polyvinyl alcohol (LIQUIFILM TEARS) 1.4 % ophthalmic solution 1 drop, 1 drop, Both Eyes, BID PRN, Smith, Rondell A, MD .  sodium phosphate 30 mmol in dextrose 5 % 250 mL infusion, 30 mmol, Intravenous, Once, Hall, Carole N, DO .  warfarin (COUMADIN) tablet 7.5 mg, 7.5 mg, Oral, ONCE-1800, Masters, Jake Church, RPH .  Warfarin - Pharmacist Dosing Inpatient, , Does not apply, q1800, Rumbarger, Valeda Malm, RPH  Patients Current Diet:  Diet Order            Diet Heart Room service appropriate?  Yes with Assist; Fluid consistency: Thin  Diet effective now              Precautions / Restrictions Precautions Precautions: Fall Restrictions Weight Bearing Restrictions: No   Has the patient had 2 or more falls or a fall with injury in the past year? No  Prior Activity Level    Prior Functional Level Self Care: Did the patient need help bathing, dressing, using the toilet or eating? Needed some help  Indoor Mobility: Did  the patient need assistance with walking from room to room (with or without device)? Independent  Stairs: Did the patient need assistance with internal or external stairs (with or without device)? Needed some help  Functional Cognition: Did the patient need help planning regular tasks such as shopping or remembering to take medications? [place] / Equipment Home Assistive Devices/Equipment: Eyeglasses, Radio producer (specify quad or straight) Home Equipment: Walker - 2 wheels, Cane - single point  Prior Device Use: Indicate devices/aids used by the patient prior to current illness, exacerbation or injury? cane  Current Functional Level Cognition  Overall Cognitive Status: Impaired/Different from baseline Orientation Level: Oriented X4 Following Commands: Follows multi-step commands with increased time, Follows multi-step commands consistently General Comments: had difficulty sequencing tasks such as turning around to sit on toilet and sequencing appropriate steps for brushing teeth, required her to audibly discuss steps;requires extended time to process through multistep sequences, verbalized she wanted to use the commode prior to returning to bed, went to bed first, realized prior to sitting and proceeded to toilet, was able to self correct    Extremity Assessment (includes Sensation/Coordination)  Upper Extremity Assessment: RUE deficits/detail, LUE deficits/detail RUE Deficits / Details: grossly 3+/5 MMT;demonstrated slight tremor  during functional activities, reports this is baseline, impacts coordination during activities RUE Sensation: WNL RUE Coordination: decreased fine motor LUE Deficits / Details: strength grossly 3/5;decreased coordination with finger-to-nose;decreased grip strength impacting ability to open containers;decreased fine motor coordination and rapid alternating movements;demonstrates compensatory strategies for decreased fine motor coordination during bilateral tasks, uses RUE dominantly LUE Sensation: WNL LUE Coordination: decreased fine motor, decreased gross motor  Lower Extremity Assessment: Defer to PT evaluation LLE Deficits / Details: reported increased weakness and control following session, alerted RN who reported she was going to see her.  LLE Sensation: decreased light touch, decreased proprioception LLE Coordination: decreased fine motor, decreased gross motor    ADLs  Overall ADL's : Needs assistance/impaired Eating/Feeding: Minimal assistance, Sitting Grooming: Minimal assistance, Sitting Grooming Details (indicate cue type and reason): pt able to complete ADL with increased time and effort;required minA for stability while standing at sink level;brushed teeth  Upper Body Bathing: Min guard, Sitting Lower Body Bathing: Min guard, Sit to/from stand Lower Body Bathing Details (indicate cue type and reason): pt donned shoes and tied shoe laces,  Upper Body Dressing : Min guard, Sitting Upper Body Dressing Details (indicate cue type and reason): appears she may require assistance for buttons Lower Body Dressing: Minimal assistance, Sit to/from stand Toilet Transfer: Minimal assistance, RW, Ambulation Toilet Transfer Details (indicate cue type and reason): required increased time to sit on the commode Toileting- Clothing Manipulation and Hygiene: Minimal assistance, Sit to/from stand Functional mobility during ADLs: Minimal assistance, Rolling walker General ADL Comments: demonstrated  decreased activity tolerance, decreased use of BUE functionally, and increased fatigue towards end of session    Mobility  Overal bed mobility: Needs Assistance Bed Mobility: Rolling, Sidelying to Sit Rolling: Supervision Sidelying to sit: Supervision General bed mobility comments: pt rolled L with use of rail with RUE, able to come to sitting with increased time and had difficulty moving L side body into position EOB    Transfers  Overall transfer level: Needs assistance Equipment used: Rolling walker (2 wheeled) Transfers: Sit to/from Stand Sit to Stand: Min assist General transfer comment: min A to gain balance as pt came to standing, vc's for mgmt of LUE.     Ambulation / Gait / Stairs / Emergency planning/management officer  Ambulation/Gait Ambulation/Gait assistance: Min assist, Mod assist Gait Distance (Feet): 50 Feet Assistive device: Rolling walker (2 wheeled) Gait Pattern/deviations: Step-to pattern, Decreased weight shift to left, Shuffle, Narrow base of support, Decreased step length - left General Gait Details: pt dragging LLE initially and not wt shifting to L. Was able to increase step length with vc's but still not past RLE. Pt reliant on RW, would not be safe with SPC at this point (her device at home PTA). Needed mod A for turning and mod instructional cues at times due to problem solving deficits Gait velocity: decreased Gait velocity interpretation: <1.31 ft/sec, indicative of household ambulator    Posture / Balance Dynamic Sitting Balance Sitting balance - Comments: balance limitation noted in sitting, decreased ability to achieve balance with perturbation to L Balance Overall balance assessment: Needs assistance Sitting-balance support: Single extremity supported Sitting balance-Leahy Scale: Fair Sitting balance - Comments: balance limitation noted in sitting, decreased ability to achieve balance with perturbation to L Standing balance support: Bilateral upper extremity  supported Standing balance-Leahy Scale: Poor Standing balance comment: unable to safely stand without support today    Special needs/care consideration BiPAP/CPAP no CPM no Continuous Drip IV no Dialysis no        Days n/a Life Vest no Oxygen no Special Bed no Trach Size no Wound Vac (area) n/a      Location  Skin ecchymosis to R knee              Bowel mgmt: continent, last BM 10/26 Bladder mgmt: continent Diabetic mgmt: no Behavioral consideration no Chemo/radiation no   Previous Home Environment (from acute therapy documentation) Living Arrangements: Spouse/significant other Available Help at Discharge: Family Type of Home: House Home Layout: Able to live on main level with bedroom/bathroom Home Access: Stairs to enter Entrance Stairs-Rails: None Entrance Stairs-Number of Steps: 1 Bathroom Shower/Tub: Chiropodist: Standard Home Care Services: Yes Type of Home Care Services: [place] (if known): private pay  Discharge Living Setting Does the patient have any problems obtaining your medications?: No  Social/Family/Support Systems    Goals/Additional Needs    Decrease burden of Care through IP rehab admission: n/a  Possible need for SNF placement upon discharge: Not anticipated.   Patient Condition: I have reviewed medical records from [facility], spoken with CM, and patient and spouse. I discussed via phone for inpatient rehabilitation assessment.  Patient will benefit from ongoing PT and OT, can actively participate in 3 hours of therapy a day 5 days of the week, and can make measurable gains during the admission.  Patient will also benefit from the coordinated team approach during an Inpatient Acute Rehabilitation admission.  The patient will receive intensive therapy as well as Rehabilitation physician, nursing, social worker, and care management interventions.  Due to safety, medication administration, pain  management and patient education the patient requires 24 hour a day rehabilitation nursing.  The patient is currently min to mod assist with mobility and basic ADLs.  Discharge setting and therapy post discharge at home with home health is anticipated.  Patient has agreed to participate in the Acute Inpatient Rehabilitation Program and will admit today.  Preadmission Screen Completed By:  Michel Santee, PT, DPT 09/05/2019 12:03 PM ______________________________________________________________________   Discussed status with Dr. Posey Pronto on 09/05/19  at 12:08 PM  and received approval for admission today.  Admission Coordinator:  Michel Santee, PT, DPT time 12:08 PM Sudie Grumbling 09/05/19    Assessment/Plan:  Diagnosis: acute on chronic right parietal cortical and subcortical brain  1. Does the need for close, 24 hr/day Medical supervision in concert with the patient's rehab needs make it unreasonable for this patient to be served in a less intensive setting? Yes  2. Co-Morbidities requiring supervision/potential complications: hypertension, diabetes mellitus, mechanical mitral valve, multiple CVAs with residual left-sided weakness 3. Due to safety, disease management and patient education, does the patient require 24 hr/day rehab nursing? Yes 4. Does the patient require coordinated care of a physician, rehab nurse, PT, OT, to address physical and functional deficits in the context of the above medical diagnosis(es)? Yes Addressing deficits in the following areas: balance, endurance, locomotion, strength, transferring, bathing, dressing, toileting and psychosocial support 5. Can the patient actively participate in an intensive therapy program of at least 3 hrs of therapy 5 days a week? Yes 6. The potential for patient to make measurable gains while on inpatient rehab is excellent 7. Anticipated functional outcomes upon discharge from inpatient rehab: supervision and min assist PT, supervision and min  assist OT, n/a SLP 8. Estimated rehab length of stay to reach the above functional goals is: 10-14 days. 9. Anticipated discharge destination: Home 10. Overall Rehab/Functional Prognosis: good   MD Signature: Delice Lesch, MD, ABPMR

## 2019-09-05 NOTE — H&P (Signed)
Physical Medicine and Rehabilitation Admission H&P     HPI: [name] is a 73 year old right-handed female history of hypertension, diabetes mellitus, mechanical mitral valve replacement on anticoagulation as well as low-dose aspirin, multiple CVAs with residual left-sided weakness.  Per chart review and patient, she lives with her spouse. She used a cane in a community for ambulation furniture walking inside the home as well as wearing a left AFO brace.  Husband does assist with some basic ADLs.  Two-level home with bed and bath on main level and one-step to entry.  Presented 09/03/2019 with worsening left hemiparesis. Denied any chest pain or shortness of breath.  INR on admission of 2.9.  Cranial CT scan unremarkable for acute intracranial process, chronic changes. CT angiogram of head and neck with no emergent findings.  MRI showed old hemorrhagic infarct in the right parietal cortical and subcortical brain which has progressed atrophy, encephalomalacia and gliosis.  Suspicion of minor acute extension along the anterior margin of the old infarction affecting the deep white matter and small focus adjacent cortical brain.  Tiny stable meningioma along the left posterior falx without mass-effect without change since 2018.  Echo with EF of 60%. Neurology follow-up patient remains on low-dose aspirin and Coumadin prior to admission.  Tolerating a regular consistency diet.  Therapy evaluations completed and patient was admitted for a comprehensive rehab program.  Review of Systems  Constitutional: Negative for chills and fever.  HENT: Negative for hearing loss.   Eyes: Negative for blurred vision and double vision.  Respiratory: Negative for cough and shortness of breath.   Cardiovascular: Negative for chest pain.  Gastrointestinal: Positive for constipation. Negative for heartburn and nausea.  Genitourinary: Negative for dysuria, flank pain and hematuria.  Musculoskeletal: Positive for joint  pain and myalgias.  Skin: Negative for rash.  Neurological: Positive for dizziness, sensory change and focal weakness.  Psychiatric/Behavioral: The patient has insomnia.    Past Medical History:  Diagnosis Date   Abnormality of gait 09/07/2016   Allergy    Diabetes mellitus without complication [facility])    Patient denies this - notes history of glucose intolerance   Hemiparesis and alteration of sensations as late effects of stroke ([place]) 09/07/2016   History of pneumonia 1997   S/P MVR (mitral valve replacement)    Mechanical mitral valve replacement at age 45 (done in [place])  // echo 7/17: EF 55-60, normal wall motion, bileaflet mechanical mitral valve prosthesis functioning normally, mild LAE, mildly reduced RVSF, small pericardial effusion   Stroke ([place]) 1997, 2013, 2015   Past Surgical History:  Procedure Laterality Date   ABDOMINAL HYSTERECTOMY     BRAIN SURGERY     BUNIONECTOMY  1993   CARDIAC VALVE REPLACEMENT  1997   TOE [place]   Family History  Problem Relation Age of Onset   Cancer Mother        Bone   Heart disease Mother    Hyperlipidemia Mother    Hypertension Mother    Stroke Father    Hypertension Father    Heart attack Neg Hx    Social History:  reports that she has never smoked. She has never used smokeless tobacco. She reports that she does not drink alcohol or use drugs. Allergies:  Allergies  Allergen Reactions   Zoloft [Sertraline] Hives, Itching and Rash   Medications Prior to Admission  Medication Sig Dispense Refill   acetaminophen (TYLENOL) 500 MG tablet Take 1,000 mg by mouth every morning.  Pt may take up to 6 tabs daily     amLODipine (NORVASC) 10 MG tablet Take 10 mg daily by mouth.     antiseptic oral rinse (BIOTENE) LIQD 15 mLs by Mouth Rinse route as needed for dry mouth.     aspirin EC 81 MG tablet Take 81 mg by mouth daily.      atorvastatin (LIPITOR) 20 MG tablet Take 20 mg by mouth daily.     calcium  carbonate 1250 MG capsule Take 1,200 mg by mouth at bedtime.      cholecalciferol (VITAMIN D) 1000 UNITS tablet Take 2,000 Units daily by mouth.      docusate sodium (COLACE) 100 MG capsule Take 100 mg at bedtime by mouth.      omeprazole (PRILOSEC) 20 MG capsule Take 20 mg by mouth at bedtime.      Propylene Glycol-Glycerin (SOOTHE) 0.6-0.6 % SOLN Place 1 drop into both eyes 2 (two) times daily as needed (dry eyes).     denosumab (PROLIA) 60 MG/ML SOLN injection Inject 60 mg into the skin every 6 (six) months. Administer in upper arm, thigh, or abdomen     warfarin (COUMADIN) 5 MG tablet TAKE BY MOUTH AS DIRECTED BY THE COUMADIN CLINIC FOR 30 DAYS. 45 tablet 3    Drug Regimen Review Drug regimen was reviewed and remains appropriate with no significant issues identified  Home: Home Living Family/patient expects to be discharged to:: Private residence Living Arrangements: Spouse/significant other Available Help at Discharge: Family Type of Home: House Home Access: Stairs to enter Technical brewer of Steps: 1 Entrance Stairs-Rails: None Home Layout: Able to live on main level with bedroom/bathroom Bathroom Shower/Tub: Chiropodist: Standard Home Equipment: Environmental consultant - 2 wheels, [place] - single point   Functional History: Prior Function Level of Independence: Needs assistance Gait / Transfers Assistance Needed: uses cane in community, furniture walks at times at home. Wears L AFO ADL's / Homemaking Assistance Needed: sponge bathes, husband assists  Functional Status:  Mobility: Bed Mobility Overal bed mobility: Needs Assistance Bed Mobility: Rolling, Sidelying to Sit Rolling: Supervision Sidelying to sit: Supervision General bed mobility comments: pt rolled L with use of rail with RUE, able to come to sitting with increased time and had difficulty moving L side body into position EOB Transfers Overall transfer level: Needs assistance Equipment used:  Rolling walker (2 wheeled) Transfers: Sit to/from Stand Sit to Stand: Min assist General transfer comment: min A to gain balance as pt came to standing, vc's for mgmt of LUE.  Ambulation/Gait Ambulation/Gait assistance: Min assist, Mod assist Gait Distance (Feet): 50 Feet Assistive device: Rolling walker (2 wheeled) Gait Pattern/deviations: Step-to pattern, Decreased weight shift to left, Shuffle, Narrow base of support, Decreased step length - left General Gait Details: pt dragging LLE initially and not wt shifting to L. Was able to increase step length with vc's but still not past RLE. Pt reliant on RW, would not be safe with SPC at this point (her device at home PTA). Needed mod A for turning and mod instructional cues at times due to problem solving deficits Gait velocity: decreased Gait velocity interpretation: <1.31 ft/sec, indicative of household ambulator    ADL: ADL Overall ADL's : Needs assistance/impaired Eating/Feeding: Minimal assistance, Sitting Grooming: Minimal assistance, Sitting Grooming Details (indicate cue type and reason): pt able to complete ADL with increased time and effort;required minA for stability while standing at sink level;brushed teeth  Upper Body Bathing: Min guard, Sitting Lower Body Bathing: Min  guard, Sit to/from stand Lower Body Bathing Details (indicate cue type and reason): pt donned shoes and tied shoe laces,  Upper Body Dressing : Min guard, Sitting Upper Body Dressing Details (indicate cue type and reason): appears she may require assistance for buttons Lower Body Dressing: Minimal assistance, Sit to/from stand Toilet Transfer: Minimal assistance, RW, Ambulation Toilet Transfer Details (indicate cue type and reason): required increased time to sit on the commode Toileting- Clothing Manipulation and Hygiene: Minimal assistance, Sit to/from stand Functional mobility during ADLs: Minimal assistance, Rolling walker General ADL Comments:  demonstrated decreased activity tolerance, decreased use of BUE functionally, and increased fatigue towards end of session  Cognition: Cognition Overall Cognitive Status: Impaired/Different from baseline Orientation Level: Oriented X4 Cognition Arousal/Alertness: Awake/alert Behavior During Therapy: WFL for tasks assessed/performed Overall Cognitive Status: Impaired/Different from baseline Area of Impairment: Problem solving, Following commands Following Commands: Follows multi-step commands with increased time, Follows multi-step commands consistently Problem Solving: Slow processing, Difficulty sequencing, Requires verbal cues, Requires tactile cues General Comments: had difficulty sequencing tasks such as turning around to sit on toilet and sequencing appropriate steps for brushing teeth, required her to audibly discuss steps;requires extended time to process through multistep sequences, verbalized she wanted to use the commode prior to returning to bed, went to bed first, realized prior to sitting and proceeded to toilet, was able to self correct  Physical Exam: Blood pressure 134/84, pulse 71, temperature 98 F (36.7 C), temperature source Oral, resp. rate 16, height 5\' 7"  (1.702 m), weight 60.7 kg, SpO2 96 %. Physical Exam  Vitals reviewed. Constitutional: She appears well-developed and well-nourished.  HENT:  Head: Normocephalic and atraumatic.  Eyes: EOM are normal. Right eye exhibits no discharge. Left eye exhibits no discharge.  Respiratory: Effort normal. No respiratory distress.  GI: She exhibits no distension.  Musculoskeletal:     Comments: No edema or tenderness in extremities  Neurological: She is alert.  Her husband is at bedside.   Makes good eye contact with examiner and follows full commands.   Good awareness of deficits. Motor: RUE: 4+/5 proximal to distal LUE: shoulder abduction, elbow flex/ext 3+-4-/5, hand grip 4/5 LLE: HF, KE 3-/5, ADF limited due to AFO    Skin: Skin is warm and dry.  Psychiatric: She has a normal mood and affect. Her behavior is normal.    Results for orders placed or performed during the hospital encounter of 09/02/19 (from the past 48 hour(s))  Protime-INR     Status: Abnormal   Collection Time: 09/03/19 10:57 AM  Result Value Ref Range   Prothrombin Time 25.9 (H) 11.4 - 15.2 seconds   INR 2.4 (H) 0.8 - 1.2    Comment: (NOTE) INR goal varies based on device and disease states. Performed at [facility], [place] [address] [place], [place] [address]   SARS CORONAVIRUS 2 (TAT 6-24 HRS) Nasopharyngeal Nasopharyngeal Swab     Status: None   Collection Time: 09/03/19 12:00 PM   Specimen: Nasopharyngeal Swab  Result Value Ref Range   SARS Coronavirus 2 NEGATIVE NEGATIVE    Comment: (NOTE) SARS-CoV-2 target nucleic acids are NOT DETECTED. The SARS-CoV-2 RNA is generally detectable in upper and lower respiratory specimens during the acute phase of infection. Negative results do not preclude SARS-CoV-2 infection, do not rule out co-infections with other pathogens, and should not be used as the sole basis for treatment or other patient management decisions. Negative results must be combined with clinical observations, patient history, and epidemiological information. The  expected result is Negative. Fact Sheet for Patients: SugarRoll.be Fact Sheet for Healthcare Providers: https://www.woods-mathews.com/ This test is not yet approved or cleared by the [place] FDA and  has been authorized for detection and/or diagnosis of SARS-CoV-2 by FDA under an Emergency Use Authorization (EUA). This EUA will remain  in effect (meaning this test can be used) for the duration of the COVID-19 declaration under Section 56 4(b)(1) of the Act, 21 U.S.C. section 360bbb-3(b)(1), unless the authorization is terminated or revoked sooner. Performed at [facility], [place] [address], [place], [place] [address]   Heparin level (unfractionated)     Status: Abnormal   Collection Time: 09/03/19  9:46 PM  Result Value Ref Range   Heparin Unfractionated 0.16 (L) 0.30 - 0.70 IU/mL    Comment: (NOTE) If heparin results are below expected values, and patient dosage has  been confirmed, suggest follow up testing of antithrombin III levels. Performed at [facility], [place] [address] [place], [place] [address]   Hemoglobin A1c     Status: Abnormal   Collection Time: 09/04/19  3:59 AM  Result Value Ref Range   Hgb A1c MFr Bld 6.1 (H) 4.8 - 5.6 %    Comment: (NOTE) Pre diabetes:          5.7%-6.4% Diabetes:              >6.4% Glycemic control for   <7.0% adults with diabetes    Mean Plasma Glucose 128.37 mg/dL    Comment: Performed at [facility] [address], [place], [place] [address]  Lipid panel     Status: None   Collection Time: 09/04/19  3:59 AM  Result Value Ref Range   Cholesterol 125 0 - 200 mg/dL   Triglycerides 29 <150 mg/dL   HDL 54 >40 mg/dL   Total CHOL/HDL Ratio 2.3 RATIO   VLDL 6 0 - 40 mg/dL   LDL Cholesterol 65 0 - 99 mg/dL    Comment:        Total Cholesterol/HDL:CHD Risk Coronary Heart Disease Risk Table                     Men   Women  1/2 Average Risk   3.4   3.3  Average Risk       5.0   4.4  2 X Average Risk   9.6   7.1  3 X Average Risk  23.4   11.0        Use the calculated Patient Ratio above and the CHD Risk Table to determine the patient's CHD Risk.        ATP III CLASSIFICATION (LDL):  <100     mg/dL   Optimal  100-129  mg/dL   Near or Above                    Optimal  130-159  mg/dL   Borderline  160-189  mg/dL   High  >190     mg/dL   Very High Performed at [facility] [address] [place] [address]   CBC     Status: Abnormal   Collection Time: 09/04/19  3:59 AM  Result Value Ref Range   WBC 4.1 4.0 - 10.5 K/uL   RBC 3.96 3.87 - 5.11 MIL/uL   Hemoglobin 11.5 (L) 12.0 - 15.0 g/dL    HCT 35.9 (L) 36.0 - 46.0 %   MCV  90.7 80.0 - 100.0 fL   MCH 29.0 26.0 - 34.0 pg   MCHC 32.0 30.0 - 36.0 g/dL   RDW 13.7 11.5 - 15.5 %   Platelets 156 150 - 400 K/uL   nRBC 0.0 0.0 - 0.2 %    Comment: Performed at [facility], [place] [address] [place], [place] [id number]  Basic metabolic panel     Status: Abnormal   Collection Time: 09/04/19  3:59 AM  Result Value Ref Range   Sodium 142 135 - 145 mmol/L   Potassium 4.0 3.5 - 5.1 mmol/L   Chloride 112 (H) 98 - 111 mmol/L   CO2 24 22 - 32 mmol/L   Glucose, Bld 131 (H) 70 - 99 mg/dL   BUN 30 (H) 8 - 23 mg/dL   Creatinine, Ser 0.59 0.44 - 1.00 mg/dL   Calcium 8.5 (L) 8.9 - 10.3 mg/dL   GFR calc non Af Amer >60 >60 mL/min   GFR calc Af Amer >60 >60 mL/min   Anion gap 6 5 - 15    Comment: Performed at [facility] [address], [place], [place] [address]  Protime-INR     Status: Abnormal   Collection Time: 09/04/19  3:59 AM  Result Value Ref Range   Prothrombin Time 23.4 (H) 11.4 - 15.2 seconds   INR 2.1 (H) 0.8 - 1.2    Comment: (NOTE) INR goal varies based on device and disease states. Performed at [facility], [place] [address] [place], [place] [address]   Heparin level (unfractionated)     Status: None   Collection Time: 09/04/19  3:59 AM  Result Value Ref Range   Heparin Unfractionated 0.38 0.30 - 0.70 IU/mL    Comment: (NOTE) If heparin results are below expected values, and patient dosage has  been confirmed, suggest follow up testing of antithrombin III levels. Performed at [facility], [place] [address], [place], [place] [address]   Heparin level (unfractionated)     Status: None   Collection Time: 09/04/19 11:39 AM  Result Value Ref Range   Heparin Unfractionated 0.38 0.30 - 0.70 IU/mL    Comment: (NOTE) If heparin results are below expected values, and patient dosage has  been confirmed, suggest follow up testing of antithrombin III levels. Performed at [facility], [place] [address], [place], [place] [address]   Protime-INR     Status: Abnormal   Collection Time: 09/05/19  3:49 AM  Result Value Ref Range   Prothrombin Time 26.4 (H) 11.4 - 15.2 seconds   INR 2.5 (H) 0.8 - 1.2    Comment: (NOTE) INR goal varies based on device and disease states. Performed at [facility], [place] [address] [place], [place] [address]   Heparin level (unfractionated)     Status: None   Collection Time: 09/05/19  3:49 AM  Result Value Ref Range   Heparin Unfractionated 0.56 0.30 - 0.70 IU/mL    Comment: (NOTE) If heparin results are below expected values, and patient dosage has  been confirmed, suggest follow up testing of antithrombin III levels. Performed at [facility], [place] [address] [place] [address]   CBC     Status: Abnormal   Collection Time: 09/05/19  3:49 AM  Result Value Ref Range   WBC 3.9 (L) 4.0 - 10.5 K/uL   RBC 4.29 3.87 - 5.11 MIL/uL   Hemoglobin 12.6 12.0 - 15.0 g/dL  HCT 38.9 36.0 - 46.0 %   MCV 90.7 80.0 - 100.0 fL   MCH 29.4 26.0 - 34.0 pg   MCHC 32.4 30.0 - 36.0 g/dL   RDW 13.7 11.5 - 15.5 %   Platelets 146 (L) 150 - 400 K/uL   nRBC 0.0 0.0 - 0.2 %    Comment: Performed at [facility] [address] [place], [place] [address]  Magnesium     Status: None   Collection Time: 09/05/19  3:49 AM  Result Value Ref Range   Magnesium 1.9 1.7 - 2.4 mg/dL    Comment: Performed at [facility] [address], [place], [place] [address]  Phosphorus     Status: Abnormal   Collection Time: 09/05/19  3:49 AM  Result Value Ref Range   Phosphorus 1.9 (L) 2.5 - 4.6 mg/dL    Comment: Performed at [facility] [address] [place], [place] [address]   Ct Angio Head W Or Wo Contrast  Result Date: 09/04/2019 CLINICAL DATA:  Stroke workup.  Left-sided weakness EXAM: CT ANGIOGRAPHY HEAD AND NECK TECHNIQUE: Multidetector CT imaging of the head and neck was performed using the standard protocol during bolus administration of  intravenous contrast. Multiplanar CT image reconstructions and MIPs were obtained to evaluate the vascular anatomy. Carotid stenosis measurements (when applicable) are obtained utilizing NASCET criteria, using the distal internal carotid diameter as the denominator. CONTRAST:  97mL OMNIPAQUE IOHEXOL 350 MG/ML SOLN COMPARISON:  09/24/2017 FINDINGS: CTA NECK FINDINGS Aortic arch: Atherosclerotic plaque.  Two vessel branching. Right carotid system: Vessels are smooth and widely patent. Limited atheromatous changes. Left carotid system: Vessels are smooth and widely patent. Minimal atherosclerotic plaque at the ICA bulb. Vertebral arteries: Proximal subclavian atherosclerosis without stenosis or ulceration. Slight left vertebral artery dominance. Both vertebral arteries are smooth and widely patent to the dura. Skeleton: Degenerative disc narrowing and facet spurring. Remote right parietal craniotomy, unremarkable. Other neck: No incidental mass or swelling. Upper chest: Negative Review of the MIP images confirms the above findings CTA HEAD FINDINGS Anterior circulation: Vessels are smooth and widely patent. No branch occlusion, aneurysm, or beading. Posterior circulation: Vertebrobasilar arteries are smooth and widely patent. Unremarkable cerebellar branches. No branch occlusion, beading, or aneurysm. Venous sinuses: Unremarkable in the arterial phase Anatomic variants: Fetal type left PCA Review of the MIP images confirms the above findings IMPRESSION: 1. No emergent finding. 2. Mild for age atherosclerosis with no flow limiting stenosis or ulceration affecting major vessels. Electronically Signed   By: Monte Fantasia M.D.   On: 09/04/2019 06:59   Ct Angio Neck W Or Wo Contrast  Result Date: 09/04/2019 CLINICAL DATA:  Stroke workup.  Left-sided weakness EXAM: CT ANGIOGRAPHY HEAD AND NECK TECHNIQUE: Multidetector CT imaging of the head and neck was performed using the standard protocol during bolus administration  of intravenous contrast. Multiplanar CT image reconstructions and MIPs were obtained to evaluate the vascular anatomy. Carotid stenosis measurements (when applicable) are obtained utilizing NASCET criteria, using the distal internal carotid diameter as the denominator. CONTRAST:  57mL OMNIPAQUE IOHEXOL 350 MG/ML SOLN COMPARISON:  09/24/2017 FINDINGS: CTA NECK FINDINGS Aortic arch: Atherosclerotic plaque.  Two vessel branching. Right carotid system: Vessels are smooth and widely patent. Limited atheromatous changes. Left carotid system: Vessels are smooth and widely patent. Minimal atherosclerotic plaque at the ICA bulb. Vertebral arteries: Proximal subclavian atherosclerosis without stenosis or ulceration. Slight left vertebral artery dominance. Both vertebral arteries are smooth and widely patent to the  dura. Skeleton: Degenerative disc narrowing and facet spurring. Remote right parietal craniotomy, unremarkable. Other neck: No incidental mass or swelling. Upper chest: Negative Review of the MIP images confirms the above findings CTA HEAD FINDINGS Anterior circulation: Vessels are smooth and widely patent. No branch occlusion, aneurysm, or beading. Posterior circulation: Vertebrobasilar arteries are smooth and widely patent. Unremarkable cerebellar branches. No branch occlusion, beading, or aneurysm. Venous sinuses: Unremarkable in the arterial phase Anatomic variants: Fetal type left PCA Review of the MIP images confirms the above findings IMPRESSION: 1. No emergent finding. 2. Mild for age atherosclerosis with no flow limiting stenosis or ulceration affecting major vessels. Electronically Signed   By: Monte Fantasia M.D.   On: 09/04/2019 06:59       Medical Problem List and Plan: 1.  Increasing left side weakness secondary to minor acute extension along the anterior margin of the old infarct right parietal cortical and subcortical affecting the deep white matter and small focus adjacent cortical  brain  Admit to CIR 2.  Antithrombotics: -DVT/anticoagulation: Chronic Coumadin with INR goal 3-3.5  -antiplatelet therapy: Aspirin 81 mg daily 3. Pain Management: Tylenol as needed 4. Mood: Provide emotional support  -antipsychotic agents: N/A 5. Neuropsych: This patient is capable of making decisions on her own behalf. 6. Skin/Wound Care: Routine skin checks 7. Fluids/Electrolytes/Nutrition: Routine in and outs. BMP ordered for tomorrow AM.  8.  Hypertension.  Norvasc 10 mg daily  Monitor with increased mobility.  9.  History of mitral valve replacement.  Continue Coumadin 10.  Hyperlipidemia.  Cont Lipitor 11. Prediabetes  Monitor with increased mobility 12. Thrombocytopenia  CBC ordered for tomorrow AM  Cathlyn Parsons, PA-C 09/05/2019  I have personally performed a face to face diagnostic evaluation, including, but not limited to relevant history and physical exam findings, of this patient and developed relevant assessment and plan.  Additionally, I have reviewed and concur with the physician assistant's documentation above.  Delice Lesch, MD, ABPMR The patient's status has not changed. The original post admission physician evaluation remains appropriate, and any changes from the pre-admission screening or documentation from the acute chart are noted above.   Delice Lesch, MD, ABPMR

## 2019-09-05 NOTE — Progress Notes (Addendum)
Inpatient Rehab Admissions:  Inpatient Rehab Consult received.  I spoke with pt and her husband over the phone for rehabilitation assessment and to discuss goals and expectations of an inpatient rehab admission.  They are both agreeable to CIR.  Husband provides 24/7 supervision at baseline.  I will page Dr. [place] Crane to see about possibly admitting pt today.   Addendum: I have approval from Dr. [place] Crane for pt to admit to CIR today. I will let pt/family, CM, and RN know.   Signed: Shann Medal, PT, DPT Admissions Coordinator [phone] 09/05/19  11:48 AM

## 2019-09-06 ENCOUNTER — Inpatient Hospital Stay (HOSPITAL_COMMUNITY): Payer: Medicare Other | Admitting: Occupational Therapy

## 2019-09-06 ENCOUNTER — Inpatient Hospital Stay (HOSPITAL_COMMUNITY): Payer: Medicare Other | Admitting: Physical Therapy

## 2019-09-06 ENCOUNTER — Inpatient Hospital Stay (HOSPITAL_COMMUNITY): Payer: Medicare Other

## 2019-09-06 DIAGNOSIS — I6381 Other cerebral infarction due to occlusion or stenosis of small artery: Secondary | ICD-10-CM

## 2019-09-06 DIAGNOSIS — I639 Cerebral infarction, unspecified: Secondary | ICD-10-CM

## 2019-09-06 DIAGNOSIS — G8194 Hemiplegia, unspecified affecting left nondominant side: Secondary | ICD-10-CM

## 2019-09-06 DIAGNOSIS — I10 Essential (primary) hypertension: Secondary | ICD-10-CM

## 2019-09-06 LAB — CBC WITH DIFFERENTIAL/PLATELET
Abs Immature Granulocytes: 0 10*3/uL (ref 0.00–0.07)
Basophils Absolute: 0 10*3/uL (ref 0.0–0.1)
Basophils Relative: 1 %
Eosinophils Absolute: 0.1 10*3/uL (ref 0.0–0.5)
Eosinophils Relative: 4 %
HCT: 40 % (ref 36.0–46.0)
Hemoglobin: 13 g/dL (ref 12.0–15.0)
Immature Granulocytes: 0 %
Lymphocytes Relative: 45 %
Lymphs Abs: 1.4 10*3/uL (ref 0.7–4.0)
MCH: 29.1 pg (ref 26.0–34.0)
MCHC: 32.5 g/dL (ref 30.0–36.0)
MCV: 89.7 fL (ref 80.0–100.0)
Monocytes Absolute: 0.2 10*3/uL (ref 0.1–1.0)
Monocytes Relative: 7 %
Neutro Abs: 1.3 10*3/uL — ABNORMAL LOW (ref 1.7–7.7)
Neutrophils Relative %: 43 %
Platelets: 160 10*3/uL (ref 150–400)
RBC: 4.46 MIL/uL (ref 3.87–5.11)
RDW: 13.7 % (ref 11.5–15.5)
WBC: 3.1 10*3/uL — ABNORMAL LOW (ref 4.0–10.5)
nRBC: 0 % (ref 0.0–0.2)

## 2019-09-06 LAB — COMPREHENSIVE METABOLIC PANEL
ALT: 16 U/L (ref 0–44)
AST: 21 U/L (ref 15–41)
Albumin: 3.7 g/dL (ref 3.5–5.0)
Alkaline Phosphatase: 51 U/L (ref 38–126)
Anion gap: 8 (ref 5–15)
BUN: 19 mg/dL (ref 8–23)
CO2: 21 mmol/L — ABNORMAL LOW (ref 22–32)
Calcium: 8.5 mg/dL — ABNORMAL LOW (ref 8.9–10.3)
Chloride: 111 mmol/L (ref 98–111)
Creatinine, Ser: 0.67 mg/dL (ref 0.44–1.00)
GFR calc Af Amer: >60 mL/min (ref >60)
GFR calc non Af Amer: >60 mL/min (ref >60)
Glucose, Bld: 104 mg/dL — ABNORMAL HIGH (ref 70–99)
Potassium: 3.8 mmol/L (ref 3.5–5.1)
Sodium: 140 mmol/L (ref 135–145)
Total Bilirubin: 0.4 mg/dL (ref 0.3–1.2)
Total Protein: 6.3 g/dL — ABNORMAL LOW (ref 6.5–8.1)

## 2019-09-06 LAB — PROTIME-INR
INR: 2.8 — ABNORMAL HIGH (ref 0.8–1.2)
Prothrombin Time: 29.1 seconds — ABNORMAL HIGH (ref 11.4–15.2)

## 2019-09-06 MED ORDER — WARFARIN SODIUM 7.5 MG PO TABS
7.5000 mg | ORAL_TABLET | Freq: Once | ORAL | Status: AC
  Administered 2019-09-06: 7.5 mg via ORAL
  Filled 2019-09-06: qty 1

## 2019-09-06 NOTE — Evaluation (Signed)
Occupational Therapy Assessment and Plan  Patient Details  Name: [name] MRN: [id number] Date of Birth: [birth year]  OT Diagnosis: hemiplegia affecting non-dominant side and muscle weakness (generalized) Rehab Potential: Rehab Potential (ACUTE ONLY): Excellent ELOS: 12-14 days   Today's Date: 09/06/2019 OT Individual Time: 1005-1108 OT Individual Time Calculation (min): 63 min     Problem List:  Patient Active Problem List   Diagnosis Date Noted  . 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 Anticoagulated on warfarin   . Left hemiparesis ([place])   . History of CVA with residual deficit   . Thrombocytopenia ([place])   . Prediabetes   . CVA (cerebral vascular accident) ([place]) 09/03/2019  . Vertigo 09/24/2017  . Benign paroxysmal positional vertigo   . History of stroke   . Hyperlipemia 08/23/2017  . Encounter for therapeutic drug monitoring 08/08/2017  . Long term (current) use of anticoagulants [Z79.01] 07/12/2017  . Hemiparesis and alteration of sensations as late effects of stroke ([place]) 09/07/2016  . Abnormality of gait 09/07/2016  . HTN (hypertension) 04/13/2016  . H/O mitral valve replacement with mechanical valve 10/16/2015  . Diabetes mellitus without complication ([place])   . Hemiparesis (L sided - mild) due to old stroke [facility])     Past Medical History:  Past Medical History:  Diagnosis Date  . Abnormality of gait 09/07/2016  . Allergy   . Diabetes mellitus without complication [facility])    Patient denies this - notes history of glucose intolerance  . Hemiparesis and alteration of sensations as late effects of stroke ([place]) 09/07/2016  . History of pneumonia 1997  . S/P MVR (mitral valve replacement)    Mechanical mitral valve replacement at age 65 (done in [place])  // echo 7/17: EF 55-60, normal wall motion, bileaflet mechanical mitral valve prosthesis functioning normally, mild LAE, mildly reduced RVSF, small pericardial effusion  . Stroke [facility]) 1997, 2013, 2015    Past Surgical History:  Past Surgical History:  Procedure Laterality Date  . ABDOMINAL HYSTERECTOMY    . BRAIN SURGERY    . BUNIONECTOMY  1993  . CARDIAC VALVE REPLACEMENT  1997  . TOE SURGERY  1996    Assessment & Plan Clinical Impression: Patient is a 73 y.o. year old female with recent admission to the hospital on 09/03/2019 with worsening left hemiparesis. Denied any chest pain or shortness of breath.  INR on admission of 2.9.  Cranial CT scan unremarkable for acute intracranial process, chronic changes. CT angiogram of head and neck with no emergent findings.  MRI showed old hemorrhagic infarct in the right parietal cortical and subcortical brain which has progressed atrophy, encephalomalacia and gliosis.    Patient transferred to CIR on 09/05/2019 .    Patient currently requires mod with basic self-care skills secondary to muscle weakness, impaired timing and sequencing, unbalanced muscle activation and decreased coordination and decreased sitting balance, decreased standing balance, decreased postural control, hemiplegia and decreased balance strategies.  Prior to hospitalization, patient could complete  with supervision.  Patient will benefit from skilled intervention to decrease level of assist with basic self-care skills and increase independence with basic self-care skills prior to discharge home with care partner.  Anticipate patient will require 24 hour supervision and follow up outpatient.  OT - End of Session Activity Tolerance: Tolerates 30+ min activity with multiple rests Endurance Deficit: Yes OT Assessment Rehab Potential (ACUTE ONLY): Excellent OT Patient demonstrates impairments in the following area(s): Balance;Endurance;Motor;Sensory OT Basic ADL's Functional Problem(s): Grooming;Bathing;Dressing;Toileting OT Advanced ADL's Functional Problem(s): Light  Housekeeping OT Transfers Functional Problem(s): Toilet;Tub/Shower OT Additional Impairment(s): Fuctional Use of  Upper Extremity OT Plan OT Intensity: Minimum of 1-2 x/day, 45 to 90 minutes OT Frequency: 5 out of 7 days OT Duration/Estimated Length of Stay: 12-14 days OT Treatment/Interventions: Balance/vestibular training;Discharge planning;Pain management;Self Care/advanced ADL retraining;Therapeutic Activities;UE/LE Coordination activities;Therapeutic Exercise;Patient/family education;Functional mobility training;DME/adaptive equipment instruction;Community reintegration;Neuromuscular re-education;UE/LE Strength taining/ROM OT Self Feeding Anticipated Outcome(s): modified independent OT Basic Self-Care Anticipated Outcome(s): supervision OT Toileting Anticipated Outcome(s): supervision OT Bathroom Transfers Anticipated Outcome(s): supervision OT Recommendation Patient destination: Home Follow Up Recommendations: 24 hour supervision/assistance;Outpatient OT Equipment Recommended: To be determined   Skilled Therapeutic Intervention Pt worked on selfcare retraining sit to stand at the sink.  She was able to complete UB bathing with supervision and LB bathing with min assist sit to stand.  She completed UB dressing with mod assist, donning her bra over her feet first and then pulling it up, as she would do at home.  Supervision for pullover shirt after completion of the bra.  She was able to complete LB dressing with overall mod assist for all aspects.  Pt with learned disuse of the LUE from past CVA.  She was able to integrate for washing of the RUE as well as for opening her soap bottle, but needs min instructional cueing for use.  She reports wanting to be able to open her water bottle and various items more independently.  She reports only sponge bathing at home as she does not have a desire to get into the shower. Slight left lean noted when sitting in the chair as well during bathing and dressing tasks.  Her spouse was present as well and they both report sharing duties around the house, however they  avoid cooking most of the time and instead like to eat out.  Pt left in the wheelchair at end of session with safety alarm pad in place and call button and phone in reach.  Discussed expectations for supervision level goals and LOS around 1.5 weeks.  They are in agreement with plan.    OT Evaluation Precautions/Restrictions  Precautions Precautions: Fall Restrictions Weight Bearing Restrictions: No   Pain  No report of pain  Home Living/Prior Functioning Home Living Family/patient expects to be discharged to:: Private residence Living Arrangements: Spouse/significant other Available Help at Discharge: Family, Industrial/product designer, Available 24 hours/day Type of Home: House Home Access: Stairs to enter [employer] of Steps: 1 small step-up (not a true stair) Entrance Stairs-Rails: None Home Layout: Able to live on main level with bedroom/bathroom, Two level Bathroom Shower/Tub: Walk-in shower(pt sponge bathes only) Bathroom Toilet: Standard Bathroom Accessibility: Yes  Lives With: Spouse IADL History Homemaking Responsibilities: Yes Meal Prep Responsibility: Secondary Laundry Responsibility: Secondary Cleaning Responsibility: Secondary Current License: Yes Occupation: Retired Prior Function Level of Independence: Independent with transfers, Independent with gait, Requires assistive device for independence, Independent with homemaking with ambulation  Able to Take Stairs?: Yes Driving: Yes Vocation: Retired Comments: reports doesn't use AD inside and uses cane for all outdoor or community mobility; would use husband's arm for support to step-up curb in community; 2 ladies come 2xmonth for vacuuming and deep floor cleaning but pt does all daily cleaning/light cooking/laundry tasks ADL ADL Eating: Set up Where Assessed-Eating: Wheelchair Grooming: Setup Where Assessed-Grooming: Clinical biochemist Bathing: Supervision/safety Where Assessed-Upper Body Bathing:  Wheelchair Lower Body Bathing: Minimal assistance Where Assessed-Lower Body Bathing: Wheelchair Upper Body Dressing: Moderate assistance Where Assessed-Upper Body Dressing: Wheelchair Lower Body Dressing: Moderate assistance Where Assessed-Lower  Body Dressing: Wheelchair Toileting: Minimal assistance Where Assessed-Toileting: Bedside Commode Toilet Transfer: Minimal assistance Toilet Transfer Method: Stand pivot Toilet Transfer Equipment: Bedside commode Vision Baseline Vision/History: Wears glasses Wears Glasses: At all times Patient Visual Report: Blurring of vision(pt with increased blurring of vision when looking at the computer, especially in the left eye) Vision Assessment?: (vision no formally assessed, will look at closer in treatment sessions)   Praxis Praxis: Intact Cognition Overall Cognitive Status: Within Functional Limits for tasks assessed Arousal/Alertness: Awake/alert Orientation Level: Person;Place;Situation Person: Oriented Place: Oriented Situation: Oriented Year: 2020 Month: October Day of Week: Correct Memory: Appears intact Immediate Memory Recall: Sock;Blue;Bed Memory Recall Sock: Without Cue Memory Recall Blue: Without Cue Memory Recall Bed: Without Cue Attention: Focused;Sustained Focused Attention: Appears intact Sustained Attention: Appears intact Awareness: Appears intact Problem Solving: Appears intact Safety/Judgment: Appears intact Sensation Sensation Light Touch: Impaired Detail Peripheral sensation comments: impaired L LE sensation compared to R LE (espeically on lateral side of leg) Light Touch Impaired Details: Impaired LUE Hot/Cold: Not tested Proprioception: Impaired Detail Proprioception Impaired Details: Impaired LUE Stereognosis: Not tested Coordination Gross Motor Movements are Fluid and Coordinated: No Fine Motor Movements are Fluid and Coordinated: No Coordination and Movement Description: Pt with history of LUE and LLE  hemiparesis from previous CVA.  She uses functionally at a diminshed level with selfcare tasks, but demonstrates some learned non-use by her report. Heel Shin Test: impaired L LE coordination with ataxia Motor  Motor Motor: Hemiplegia;Abnormal postural alignment and control Motor - Skilled Clinical Observations: L hemibody paresis and impaired coordination Mobility  Bed Mobility Bed Mobility: Supine to Sit;Sit to Supine;Rolling Left Rolling Left: Supervision/Verbal cueing Supine to Sit: Contact Guard/Touching assist Sit to Supine: Contact Guard/Touching assist Transfers Sit to Stand: Minimal Assistance - Patient > 75% Stand to Sit: Minimal Assistance - Patient > 75%  Trunk/Postural Assessment  Cervical Assessment Cervical Assessment: Exceptions to WFL(flexed head) Thoracic Assessment Thoracic Assessment: Exceptions to WFL(thoracic flexion at rest) Lumbar Assessment Lumbar Assessment: (posterior pelvic tilt) Postural Control Postural Control: Deficits on evaluation Righting Reactions: delayed and inadequate Postural Limitations: decreased  Balance Balance Balance Assessed: Yes Static Sitting Balance Static Sitting - Balance Support: Feet supported Static Sitting - Level of Assistance: 5: Stand by assistance Dynamic Sitting Balance Dynamic Sitting - Balance Support: During functional activity Dynamic Sitting - Level of Assistance: 4: Min assist Static Standing Balance Static Standing - Balance Support: During functional activity Static Standing - Level of Assistance: 4: Min assist Dynamic Standing Balance Dynamic Standing - Balance Support: During functional activity Dynamic Standing - Level of Assistance: 3: Mod assist Extremity/Trunk Assessment RUE Assessment RUE Assessment: Within Functional Limits LUE Assessment LUE Assessment: Exceptions to [facility] Active Range of Motion (AROM) Comments: Pt with AROM in all joints with shoulder flexion 0-150 degrees with slight elbow  flexion noted with shoulder flexion.  Brunnstrum stage V in the arm with fill digit AROM noted. General Strength Comments: Pt with Brunnstrum stage V in the hand and stage VI in the digits.  Decreased FM coordination noted with opening small bottle tops and increased time needed to tie her shoes.  General strength 3/5 throughout     Refer to Care Plan for Long Term Goals  Recommendations for other services: None    Discharge Criteria: Patient will be discharged from OT if patient refuses treatment 3 consecutive times without medical reason, if treatment goals not met, if there is a change in medical status, if patient makes no progress towards goals or if  patient is discharged from hospital.  The above assessment, treatment plan, treatment alternatives and goals were discussed and mutually agreed upon: by patient and by family  Devan Danzer OTR/L 09/06/2019, 4:56 PM

## 2019-09-06 NOTE — Progress Notes (Signed)
Inpatient Rehabilitation  Patient information reviewed and entered into eRehab system by Javier Mamone M. Novis League, M.A., CCC/SLP, PPS Coordinator.  Information including medical coding, functional ability and quality indicators will be reviewed and updated through discharge.    

## 2019-09-06 NOTE — Plan of Care (Signed)
  Problem: Consults Goal: RH STROKE PATIENT EDUCATION Description: See Patient Education module for education specifics  Outcome: Progressing Goal: Nutrition Consult-if indicated Outcome: Progressing   Problem: RH BOWEL ELIMINATION Goal: RH STG MANAGE BOWEL WITH ASSISTANCE Description: STG Manage Bowel with supervision Assistance. Outcome: Progressing Goal: RH STG MANAGE BOWEL W/MEDICATION W/ASSISTANCE Description: STG Manage Bowel with Medication with supervision Assistance. Outcome: Progressing   Problem: RH BLADDER ELIMINATION Goal: RH STG MANAGE BLADDER WITH ASSISTANCE Description: STG Manage Bladder With supervision Assistance Outcome: Progressing   Problem: RH SKIN INTEGRITY Goal: RH STG SKIN FREE OF INFECTION/BREAKDOWN Description: Skin to remain free from breakdown while on rehab with min assist. Outcome: Progressing Goal: RH STG MAINTAIN SKIN INTEGRITY WITH ASSISTANCE Description: STG Maintain Skin Integrity With min Assistance. Outcome: Progressing Goal: RH STG ABLE TO PERFORM INCISION/WOUND CARE W/ASSISTANCE Description: STG Able To Perform Incision/Wound Care With min Assistance. Outcome: Progressing   Problem: RH SAFETY Goal: RH STG ADHERE TO SAFETY PRECAUTIONS W/ASSISTANCE/DEVICE Description: STG Adhere to Safety Precautions With supervision Assistance and appropriate assistive Device. Outcome: Progressing   Problem: RH PAIN MANAGEMENT Goal: RH STG PAIN MANAGED AT OR BELOW PT'S PAIN GOAL Description: <3 on a 0-10 pain scale. Outcome: Progressing   Problem: RH KNOWLEDGE DEFICIT Goal: RH STG INCREASE KNOWLEDGE OF HYPERTENSION Description: Patient will be able to demonstrate knowledge of HTN, medications, BP parameters, and dietary restrictions with min assist from rehab staff. Outcome: Progressing Goal: RH STG INCREASE KNOWLEGDE OF HYPERLIPIDEMIA Description: Patient will demonstrate knowledge of HLD medications and dietary restrictions with min assist from  rehab staff. Outcome: Progressing Goal: RH STG INCREASE KNOWLEDGE OF STROKE PROPHYLAXIS Description: Patient will demonstrate knowledge of stroke prevention medications with min assist from rehab staff. Outcome: Progressing

## 2019-09-06 NOTE — Progress Notes (Signed)
[facility] PHYSICAL MEDICINE & REHABILITATION PROGRESS NOTE   Subjective/Complaints: No new complaints. Up with therapy. Had a good night  ROS: Patient denies fever, rash, sore throat, blurred vision, nausea, vomiting, diarrhea, cough, shortness of breath or chest pain, joint or back pain, headache, or mood change.    Objective:   No results found. Recent Labs    09/05/19 0349 09/06/19 0533  WBC 3.9* 3.1*  HGB 12.6 13.0  HCT 38.9 40.0  PLT 146* 160   Recent Labs    09/04/19 0359 09/06/19 0533  NA 142 140  K 4.0 3.8  CL 112* 111  CO2 24 21*  GLUCOSE 131* 104*  BUN 30* 19  CREATININE 0.59 0.67  CALCIUM 8.5* 8.5*    Intake/Output Summary (Last 24 hours) at 09/06/2019 1124 Last data filed at 09/06/2019 0821 Gross per 24 hour  Intake 240 ml  Output -  Net 240 ml     Physical Exam: Vital Signs Blood pressure 133/80, pulse 65, temperature (!) 97.5 F (36.4 C), resp. rate 18, height 5\' 7"  (1.702 m), weight 61 kg, SpO2 96 %.  Constitutional: No distress . Vital signs reviewed. HEENT: EOMI, oral membranes moist Neck: supple Cardiovascular: RRR without murmur. No JVD    Respiratory: CTA Bilaterally without wheezes or rales. Normal effort    GI: BS +, non-tender, non-distended  Musculoskeletal:     Comments: No edema or tenderness in extremities  Neurological: She is alert and oriented x 3    Good awareness of deficits. Motor: RUE: 4+/5 proximal to distal LUE: 3+ prox to 4/5 distally. Mild left PD LLE: HF, KE 3- to 3/5, ADF limited due to AFO  Skin: Skin is warm and dry.  Psychiatric: pleasant    Assessment/Plan: 1. 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  Physiatrist is providing close team supervision and 24 hour management of active medical problems listed below.  Physiatrist and rehab team continue to assess barriers to discharge/monitor patient  progress toward functional and medical goals  Care Tool:  Bathing              Bathing assist       Upper Body Dressing/Undressing Upper body dressing        Upper body assist      Lower Body Dressing/Undressing Lower body dressing      What is the patient wearing?: Underwear/pull up     Lower body assist Assist for lower body dressing: Minimal Assistance - Patient > 75%     Toileting Toileting    Toileting assist Assist for toileting: Minimal Assistance - Patient > 75%     Transfers Chair/bed transfer  Transfers assist     Chair/bed transfer assist level: Minimal Assistance - Patient > 75%     Locomotion Ambulation   Ambulation assist              Walk 10 feet activity   Assist           Walk 50 feet activity   Assist           Walk 150 feet activity   Assist           Walk 10 feet on uneven surface  activity   Assist           Wheelchair     Assist  Wheelchair 50 feet with 2 turns activity    Assist            Wheelchair 150 feet activity     Assist          Blood pressure 133/80, pulse 65, temperature (!) 97.5 F (36.4 C), resp. rate 18, height 5\' 7"  (1.702 m), weight 61 kg, SpO2 96 %.  Medical Problem List and Plan: 1.  Increasing left side weakness secondary to minor acute extension along the anterior margin of the old infarct right parietal cortical and subcortical affecting the deep white matter and small focus adjacent cortical brain             Patient is beginning CIR therapies today including PT and OT  2.  Antithrombotics: -DVT/anticoagulation: Chronic Coumadin with INR goal 3-3.5  -INR 2.8 10/29             -antiplatelet therapy: Aspirin 81 mg daily 3. Pain Management: Tylenol as needed 4. Mood: Provide emotional support             -antipsychotic agents: N/A 5. Neuropsych: This patient is capable of making decisions on her own behalf. 6. Skin/Wound  Care: Routine skin checks 7. Fluids/Electrolytes/Nutrition: Routine in and outs.   -I personally reviewed all of the patient's labs today, and lab work is within normal limits.  .  8.  Hypertension.  Norvasc 10 mg daily             controlled 1029  9.  History of mitral valve replacement.  Continue Coumadin 10.  Hyperlipidemia.  Cont Lipitor 11. Prediabetes             Monitor with increased mobility 12. Thrombocytopenia             platelets stable to increased 160 10/29    LOS: 1 days A FACE TO [address] 09/06/2019, 11:24 AM

## 2019-09-06 NOTE — Care Management Note (Signed)
[facility] Individual Statement of Services  Patient Name:  [name]  Date:  09/06/2019  Welcome to the [facility].  Our goal is to provide you with an individualized program based on your diagnosis and situation, designed to meet your specific needs.  With this comprehensive rehabilitation program, you will be expected to participate in at least 3 hours of rehabilitation therapies Monday-Friday, with modified therapy programming on the weekends.  Your rehabilitation program will include the following services:  Physical Therapy (PT), Occupational Therapy (OT), Speech Therapy (ST), 24 hour per day rehabilitation nursing, Case Management (Social Worker), Rehabilitation Medicine, Nutrition Services and Pharmacy Services  Weekly team conferences will be held on Wenesday to discuss your progress.  Your Social Worker will talk with you frequently to get your input and to update you on team discussions.  Team conferences with you and your family in attendance may also be held.  Expected length of stay: 12-14 days  Overall anticipated outcome: supervision with cues  Depending on your progress and recovery, your program may change. Your Social Worker will coordinate services and will keep you informed of any changes. Your Social Worker's name and contact numbers are listed  below.  The following services may also be recommended but are not provided by the [facility] will be made to provide these services after discharge if needed.  Arrangements include referral to agencies that provide these services.  Your insurance has been verified to be:  [employer] Your primary doctor is:  Domenick Gong  Pertinent information will be shared with your doctor and your insurance company.  Social  Worker:  Ovidio Kin, [place] or ([phone]  Information discussed with and copy given to patient by: Elease Hashimoto, 09/06/2019, 11:50 AM

## 2019-09-06 NOTE — Progress Notes (Signed)
ANTICOAGULATION CONSULT NOTE   Pharmacy Consult for Warfarin  Indication: mechanical MVR   Patient Measurements: Height: 5\' 7"  (170.2 cm) Weight: 134 lb 7.7 oz (61 kg) IBW/kg (Calculated) : 61.6  Vital Signs: Temp: 97.5 F (36.4 C) (10/29 0612) BP: 133/80 (10/29 0612) Pulse Rate: 65 (10/29 0612)  Labs: Recent Labs    09/04/19 0359 09/04/19 1139 09/05/19 0349 09/06/19 0533  HGB 11.5*  --  12.6 13.0  HCT 35.9*  --  38.9 40.0  PLT 156  --  146* 160  LABPROT 23.4*  --  26.4* 29.1*  INR 2.1*  --  2.5* 2.8*  HEPARINUNFRC 0.38 0.38 0.56  --   CREATININE 0.59  --   --  0.67    Estimated Creatinine Clearance: 60.3 mL/min (by C-G formula based on SCr of 0.67 mg/dL).  Assessment: 73 year old female presented to the ED with worsening left sided weakness. She is on chronic warfarin for history of a mechanical valve. INR was below goal upon admission, still remains subtherapeutic today at 2.8. Will continue with warfarin and bridge with lovenox until INR is therapeutic (3-3.5).  PTA warfarin dose: 7.5 mg q Mon/Fri, 5 mg on all other days   Goal of Therapy:  Heparin level 0.3-0.5 units/ml INR 3-3.5 Monitor platelets by anticoagulation protocol: Yes   Plan:  Repeat warfarin 7.5mg  PO x 1 tonight Daily INR DC lovenox when INR>/= 3  Salome Arnt, PharmD, BCPS Clinical Pharmacist Please see AMION for all pharmacy numbers 09/06/2019 10:21 AM

## 2019-09-06 NOTE — Progress Notes (Signed)
Physical Therapy Session Note  Patient Details  Name: [name] MRN: [id number] Date of Birth: 07-Apr-1946  Today's Date: 09/06/2019 PT Individual Time: 0134-0227 PT Individual Time Calculation (min): 53 min   Short Term Goals: Week 1:    Week 2:    Week 3:     Skilled Therapeutic Interventions/Progress Updates:    PAIN: denies pain Pt initially oob in wc w/husband present.  Pt transported to gym for time efficiency.  Pt performed car transfer using straight cane as follows: STS from wc w/cga and some difficulty managing cane, 2-3steps w/cane w/shuffling gait pattern and min assist.  Turn/sit in car w/cane and min assist.  Pt able to turn and raise LEs into , lowe feet out of car w/supervision.  STS from car w/cga, turn/sit to wc w/min assist for balance.    Stairs:  Pt ascended/descended 12 steps w/1 rail using Step over step ascending and step to pattern descending and cga and verbal cues for sequencing with exception of turning on landing which required min assist and verbal cues for sequencing.  4 inch step w/walker - ascended/descended x4 using RW, min assist, and cues for sequencing and attention to L, pt needs direct cues to ensure L wheel/leg of walker are safely placed.  Seating/positioining - Pt changed into 18x16 wc w/bilat LE legrests to improve positioining and efficiency of propulsion. May be able to utilize 16wide, but seat to floor height of available chair would be challenging for STS at this time.  Pt returned to room and left oob in wc w/chair alarm set, needs in reach, and husband at bedside.      Therapy Documentation Precautions:  Precautions Precautions: Fall Restrictions Weight Bearing Restrictions: No    Therapy/Group: Individual Therapy  Callie Fielding, [place] 09/06/2019, 3:40 PM

## 2019-09-06 NOTE — Progress Notes (Signed)
Social Work Assessment and Plan   Patient Details  Name: [name] MRN: [id number] Date of Birth: [birth year]  Today's Date: 09/06/2019  Problem List:  Patient Active Problem List   Diagnosis Date Noted  . 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 Anticoagulated on warfarin   . Left hemiparesis ([place])   . History of CVA with residual deficit   . Thrombocytopenia ([place])   . Prediabetes   . CVA (cerebral vascular accident) ([place]) 09/03/2019  . Vertigo 09/24/2017  . Benign paroxysmal positional vertigo   . History of stroke   . Hyperlipemia 08/23/2017  . Encounter for therapeutic drug monitoring 08/08/2017  . Long term (current) use of anticoagulants [Z79.01] 07/12/2017  . Hemiparesis and alteration of sensations as late effects of stroke ([place]) 09/07/2016  . Abnormality of gait 09/07/2016  . HTN (hypertension) 04/13/2016  . H/O mitral valve replacement with mechanical valve 10/16/2015  . Diabetes mellitus without complication ([place])   . Hemiparesis (L sided - mild) due to old stroke [facility])    Past Medical History:  Past Medical History:  Diagnosis Date  . Abnormality of gait 09/07/2016  . Allergy   . Diabetes mellitus without complication [facility])    Patient denies this - notes history of glucose intolerance  . Hemiparesis and alteration of sensations as late effects of stroke ([place]) 09/07/2016  . History of pneumonia 1997  . S/P MVR (mitral valve replacement)    Mechanical mitral valve replacement at age 85 (done in [place])  // echo 7/17: EF 55-60, normal wall motion, bileaflet mechanical mitral valve prosthesis functioning normally, mild LAE, mildly reduced RVSF, small pericardial effusion  . Stroke [facility]) 1997, 2013, 2015   Past Surgical History:  Past Surgical History:  Procedure Laterality Date  . ABDOMINAL HYSTERECTOMY    . BRAIN SURGERY    . BUNIONECTOMY  1993  . CARDIAC VALVE REPLACEMENT  1997  . TOE SURGERY  1996   Social History:  reports that she has never  smoked. She has never used smokeless tobacco. She reports that she does not drink alcohol or use drugs.  Family / Support Systems Marital Status: Married Patient Roles: Spouse, Parent Spouse/Significant Other: [name] [phone] Children: Daughter in [place] and son in [place] Other Supports: Friends and church members Anticipated Caregiver: Husband Ability/Limitations of Caregiver: No issues-in good health Caregiver Availability: 24/7 Family Dynamics: Close knit couple and family. Moved closer to son they were in [place] living until moving here. They have friends and church members who are supportive and will check on them  Social History Preferred language: English Religion: Lutheran Cultural Background: No issues Education: HS Read: Yes Write: Yes Employment Status: Retired Public relations account executive Issues: No issues Guardian/Conservator: None-according to MD pt is capable of making her own decisions while here.  Husband will be here and assist if needed.   Abuse/Neglect Abuse/Neglect Assessment Can Be Completed: Yes Physical Abuse: Denies Verbal Abuse: Denies Sexual Abuse: Denies Exploitation of patient/patient's resources: Denies Self-Neglect: Denies  Emotional Status Pt's affect, behavior and adjustment status: Pt is motivated to improve and tired of having these strokes. She does her best to prevent them by exercising, eating right and taking her medicines. She wants to be able to take care of herself like she always has. Her husband voiced she is strong willed. Recent Psychosocial Issues: other health issues-past history of CVA's and residula deficits from them-left sided weakness Psychiatric History: No history deferred depression screen due to adjusting well and optimistic regarding her prognosis. She  may benefit from seeing neuro-psych while here due to she has had several strokes. Will ask her input and make referral. Substance Abuse History: No  issues  Patient / Family Perceptions, Expectations & Goals Pt/Family understanding of illness & functional limitations: Both are able to explain her stroke and deficits. She was concerned when she became weaker on her affected side and came right to the hospital. Both talk with the MD and feel they have a good understandingof her treatment plan going forward. Premorbid pt/family roles/activities: Wife, mom, grandmother, retiree, stroke survivor, church member, Industrial/product designer, Social research officer, government Anticipated changes in roles/activities/participation: resume Pt/family expectations/goals: Pt states: " I am hopeful I will do well here and regain my independence."  Husband states: " I hope she does well I know she will work hard, but will help her if needed."  [employer]: Other (Comment)(Gone to OP neuro in the past) Premorbid Home Care/DME Agencies: Other (Comment)(has rw and cane) Transportation available at discharge: Husband Resource referrals recommended: Support group (specify)  Discharge Planning Living Arrangements: Spouse/significant other Support Systems: Spouse/significant other, Children, Friends/neighbors, Social worker community Type of Residence: Private residence Insurance Resources: [employer], Multimedia programmer (specify)(AARP) Financial Resources: Fish farm manager, Family Support Financial Screen Referred: No Living Expenses: Own Money Management: Patient, Spouse Does the patient have any problems obtaining your medications?: No Home Management: Both she and husband Patient/Family Preliminary Plans: Return home with husband who can assist if needed. Pt really hopes she is mod/i with her cane like she was prior to admission. Discussed therapy team evaluating pt and setting goals today. Will work on discharge needs and see if would benefit from seeing neuro-psych while here. Social Work Anticipated Follow Up Needs: HH/OP, Support Group  Clinical Impression Pleasant female  who is very motivated to improve and regain her independence back from this stroke. She has been through this many times before and had to fight her way back. Her husband is very supportive and involved and will assist if needed. Will await team's evaluations and work on discharge needs and have neuro-psych see if would be beneficial to her.  Elease Hashimoto 09/06/2019, 12:34 PM

## 2019-09-06 NOTE — Evaluation (Signed)
Physical Therapy Assessment and Plan  Patient Details  Name: [name] MRN: [id number] Date of Birth: [birth year]  PT Diagnosis: Abnormal posture, Abnormality of gait, Ataxia, Ataxic gait, Difficulty walking, Hemiparesis non-dominant, Impaired sensation, Muscle weakness and Pain in back Rehab Potential: Excellent ELOS: 1.5 - 2 weeks   Today's Date: 09/06/2019 PT Individual Time: 0805-0902 PT Individual Time Calculation (min): 57 min    Problem List:  Patient Active Problem List   Diagnosis Date Noted  . 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 Anticoagulated on warfarin   . Left hemiparesis ([place])   . History of CVA with residual deficit   . Thrombocytopenia ([place])   . Prediabetes   . CVA (cerebral vascular accident) ([place]) 09/03/2019  . Vertigo 09/24/2017  . Benign paroxysmal positional vertigo   . History of stroke   . Hyperlipemia 08/23/2017  . Encounter for therapeutic drug monitoring 08/08/2017  . Long term (current) use of anticoagulants [Z79.01] 07/12/2017  . Hemiparesis and alteration of sensations as late effects of stroke ([place]) 09/07/2016  . Abnormality of gait 09/07/2016  . HTN (hypertension) 04/13/2016  . H/O mitral valve replacement with mechanical valve 10/16/2015  . Diabetes mellitus without complication ([place])   . Hemiparesis (L sided - mild) due to old stroke [facility])     Past Medical History:  Past Medical History:  Diagnosis Date  . Abnormality of gait 09/07/2016  . Allergy   . Diabetes mellitus without complication [facility])    Patient denies this - notes history of glucose intolerance  . Hemiparesis and alteration of sensations as late effects of stroke ([place]) 09/07/2016  . History of pneumonia 1997  . S/P MVR (mitral valve replacement)    Mechanical mitral valve replacement at age 30 (done in [place])  // echo 7/17: EF 55-60, normal wall motion, bileaflet mechanical mitral valve prosthesis functioning normally, mild LAE, mildly reduced RVSF, small  pericardial effusion  . Stroke [facility]) 1997, 2013, 2015   Past Surgical History:  Past Surgical History:  Procedure Laterality Date  . ABDOMINAL HYSTERECTOMY    . BRAIN SURGERY    . BUNIONECTOMY  1993  . CARDIAC VALVE REPLACEMENT  1997  . TOE SURGERY  1996    Assessment & Plan Clinical Impression: Patient is a 73 y.o. year old right-handed female history of hypertension, diabetes mellitus, mechanical mitral valve replacement on anticoagulation as well as low-dose aspirin, multiple CVAs with residual left-sided weakness.  Per chart review and patient, she lives with her spouse. She used a cane in a community for ambulation furniture walking inside the home as well as wearing a left AFO brace.  Husband does assist with some basic ADLs.  Two-level home with bed and bath on main level and one-step to entry.  Presented 09/03/2019 with worsening left hemiparesis. Denied any chest pain or shortness of breath.  INR on admission of 2.9.  Cranial CT scan unremarkable for acute intracranial process, chronic changes. CT angiogram of head and neck with no emergent findings.  MRI showed old hemorrhagic infarct in the right parietal cortical and subcortical brain which has progressed atrophy, encephalomalacia and gliosis.  Suspicion of minor acute extension along the anterior margin of the old infarction affecting the deep white matter and small focus adjacent cortical brain.  Tiny stable meningioma along the left posterior falx without mass-effect without change since 2018.  Echo with EF of 60%. Neurology follow-up patient remains on low-dose aspirin and Coumadin prior to admission.  Tolerating a regular consistency diet.  Therapy  evaluations completed and patient was admitted for a comprehensive rehab program. Patient transferred to CIR on 09/05/2019 .   Patient currently requires mod with mobility secondary to muscle weakness, decreased cardiorespiratoy endurance, impaired timing and sequencing, abnormal tone,  unbalanced muscle activation, ataxia and decreased coordination and decreased sitting balance, decreased standing balance, decreased postural control and decreased balance strategies.  Prior to hospitalization, patient was modified independent  with mobility and lived with Spouse in a House home.  Home access is 1 small step-up (not a true stair) .  Patient will benefit from skilled PT intervention to maximize safe functional mobility, minimize fall risk and decrease caregiver burden for planned discharge home with 24 hour supervision.  Anticipate patient will benefit from follow up [place] at discharge.  PT - End of Session Activity Tolerance: Tolerates 30+ min activity with multiple rests Endurance Deficit: Yes PT Assessment Rehab Potential (ACUTE/IP ONLY): Excellent PT Patient demonstrates impairments in the following area(s): Balance;Sensory;Endurance;Motor;Nutrition;Pain PT Transfers Functional Problem(s): Bed Mobility;Bed to Chair;Car;Furniture PT Locomotion Functional Problem(s): Ambulation;Stairs PT Plan PT Intensity: Minimum of 1-2 x/day ,45 to 90 minutes PT Frequency: 5 out of 7 days PT Duration Estimated Length of Stay: 1.5 - 2 weeks PT Treatment/Interventions: Ambulation/gait training;Community reintegration;DME/adaptive equipment instruction;Neuromuscular re-education;Psychosocial support;Stair training;UE/LE Strength taining/ROM;Balance/vestibular training;Discharge planning;Functional electrical stimulation;Pain management;Skin care/wound management;Therapeutic Activities;UE/LE Coordination activities;Cognitive remediation/compensation;Disease management/prevention;Functional mobility training;Patient/family education;Splinting/orthotics;Therapeutic Exercise;Visual/perceptual remediation/compensation PT Transfers Anticipated Outcome(s): supervision using LRAD PT Locomotion Anticipated Outcome(s): supervision using LRAD PT Recommendation Follow Up Recommendations: Home health PT;24 hour  supervision/assistance Patient destination: Home Equipment Recommended: To be determined;Other (comment) Equipment Details: pt has [facility]  Skilled Therapeutic Intervention Evaluation completed (see details above and below) with education on PT POC and goals and individual treatment initiated with focus on bed mobility, transfers, gait training, and activity tolerance as well as patient education regarding daily therapy schedule, weekly team meetings, purpose of PT evaluation, and other CIR information. Pt received sitting in w/c and agreeable to therapy session. Donned B LE TED hose, socks, shoes, and L LE AFO (as pt wore this PTA for standing/ambulating tasks) max assist for time management. Stand pivot w/c<>EOB, no AD, with mod assist for balance. Sit>supine with CGA for LE management and supine>sit with CGA for trunk upright (HOB flat and not using bedrails).  Transported to/from gym in w/c. Ambulated 64f mod A, no AD with L HHA, and pt demonstrating impaired L LE coordination with a jerky/ataxic type movement during swing phase with pt demonstrating significantly decreased B LE step length and gait speed as well as increased anterior trunk flexion. Ambulated 862fusing RW with min assist for balance and pt continuing to demonstrate above gait impairments with cuing for improvement. Transported back to room in w/c and pt left seated in w/c with needs in reach and chair alarm on.   PT Evaluation Precautions/Restrictions Precautions Precautions: Fall Restrictions Weight Bearing Restrictions: No Pain Pain Assessment Pain Scale: 0-10 Pain Score: 0-No pain Reports she has scoliosis and so she premedicated this morning and states that if she does start to have pain from the scoliosis it is along her L anterior thigh from static standing, but that forward movement (aka walking) alleviates this pain. Home Living/Prior Functioning Home Living Available Help at Discharge: Family;Neighbor;Available 24  hours/day Type of Home: House Home Access: Stairs to enter [employer] Steps: 1 small step-up (not a true stair) Entrance Stairs-Rails: None Home Layout: Able to live on main level with bedroom/bathroom;Two level(just bonus room upstairs)  Lives With: Spouse  Prior Function Level of Independence: Independent with transfers;Independent with gait;Requires assistive device for independence;Independent with homemaking with ambulation  Able to Take Stairs?: Yes Driving: Yes Vocation: Retired Comments: reports doesn't use AD inside and uses cane for all outdoor or community mobility; would use husband's arm for support to step-up curb in community; 2 ladies come 2xmonth for vacuuming and deep floor cleaning but pt does all daily cleaning/light cooking/laundry tasks Vision/Perception  Vision - Assessment Additional Comments: reports having increased difficulty reading small print - also received new glasses prescription Perception Perception: Within Functional Limits Praxis Praxis: Intact Cognition Overall Cognitive Status: Within Functional Limits for tasks assessed Arousal/Alertness: Awake/alert Orientation Level: Oriented X4 Attention: Focused;Sustained Focused Attention: Appears intact Sustained Attention: Appears intact Memory: Appears intact Awareness: Appears intact Problem Solving: Appears intact Safety/Judgment: Appears intact Sensation Sensation Light Touch: Impaired Detail Peripheral sensation comments: impaired L LE sensation compared to R LE (espeically on lateral side of leg) Light Touch Impaired Details: Impaired LLE Hot/Cold: Not tested Proprioception: Impaired by gross assessment(decreased awareness of L LE during functional mobility with pt often bumping her foot into RW) Stereognosis: Not tested Coordination Gross Motor Movements are Fluid and Coordinated: No Coordination and Movement Description: demonstrates impaired L LE coordination and  proprioceptive awareness during functional mobility; impaired balance Heel Shin Test: impaired L LE coordination with ataxia Motor  Motor Motor: Hemiplegia;Ataxia;Abnormal postural alignment and control Motor - Skilled Clinical Observations: L hemibody paresis and impaired coordination  Mobility Bed Mobility Bed Mobility: Supine to Sit;Sit to Supine;Rolling Left Rolling Left: Supervision/Verbal cueing Supine to Sit: Contact Guard/Touching assist Sit to Supine: Contact Guard/Touching assist Transfers Transfers: Sit to Stand;Stand to Sit;Stand Pivot Transfers Sit to Stand: Minimal Assistance - Patient > 75% Stand to Sit: Minimal Assistance - Patient > 75% Stand Pivot Transfers: Minimal Assistance - Patient > 75%;Moderate Assistance - Patient 50 - 74% Stand Pivot Transfer Details: Tactile cues for sequencing;Tactile cues for posture;Tactile cues for weight shifting;Tactile cues for placement;Verbal cues for technique;Manual facilitation for weight shifting;Verbal cues for sequencing Transfer (Assistive device): Rolling walker Locomotion  Gait Ambulation: Yes Gait Assistance: Moderate Assistance - Patient 50-74%;Minimal Assistance - Patient > 75% Gait Distance (Feet): 84 Feet Assistive device: Rolling walker;1 person hand held assist Gait Assistance Details: Tactile cues for sequencing;Tactile cues for posture;Tactile cues for weight shifting;Manual facilitation for weight shifting;Verbal cues for gait pattern;Verbal cues for technique;Visual cues for safe use of DME/AE;Verbal cues for sequencing Gait Gait: Yes Gait Pattern: Impaired Gait Pattern: Decreased step length - left;Decreased step length - right;Decreased stance time - left;Decreased stride length;Poor foot clearance - left;Narrow base of support;Trunk flexed Gait velocity: decreased Stairs / Additional Locomotion Stairs: No Wheelchair Mobility Wheelchair Mobility: No  Trunk/Postural Assessment  Cervical Assessment Cervical  Assessment: Exceptions to WFL(forward head) Thoracic Assessment Thoracic Assessment: Exceptions to WFL(increased thoracic kyphosis with rounded shoulders) Lumbar Assessment Lumbar Assessment: Exceptions to WFL(posterior pelvic tilt in sitting) Postural Control Postural Control: Deficits on evaluation Righting Reactions: delayed and inadequate Postural Limitations: decreased  Balance Balance Balance Assessed: Yes Static Sitting Balance Static Sitting - Balance Support: Feet supported Static Sitting - Level of Assistance: 5: Stand by assistance Dynamic Sitting Balance Dynamic Sitting - Balance Support: During functional activity Dynamic Sitting - Level of Assistance: 4: Min Insurance risk surveyor Standing - Balance Support: During functional activity Static Standing - Level of Assistance: 4: Min assist Dynamic Standing Balance Dynamic Standing - Balance Support: During functional activity Dynamic Standing - Level of Assistance: 4: Min assist;3: Mod  assist Extremity Assessment      RLE Assessment RLE Assessment: Within Functional Limits General Strength Comments: Grossly 4+/5 assessed in supine LLE Assessment LLE Assessment: Exceptions to [facility] LLE Strength Left Hip Flexion: 3+/5 Left Knee Flexion: 4-/5 Left Knee Extension: 3+/5 Left Ankle Dorsiflexion: 3/5 Left Ankle Plantar Flexion: 3/5    Refer to Care Plan for Long Term Goals  Recommendations for other services: None   Discharge Criteria: Patient will be discharged from PT if patient refuses treatment 3 consecutive times without medical reason, if treatment goals not met, if there is a change in medical status, if patient makes no progress towards goals or if patient is discharged from hospital.  The above assessment, treatment plan, treatment alternatives and goals were discussed and mutually agreed upon: by patient  Tawana Scale, PT, DPT 09/06/2019, 7:50 AM

## 2019-09-07 ENCOUNTER — Inpatient Hospital Stay (HOSPITAL_COMMUNITY): Payer: Medicare Other | Admitting: Physical Therapy

## 2019-09-07 ENCOUNTER — Inpatient Hospital Stay (HOSPITAL_COMMUNITY): Payer: Medicare Other | Admitting: Occupational Therapy

## 2019-09-07 ENCOUNTER — Inpatient Hospital Stay (HOSPITAL_COMMUNITY): Payer: Medicare Other

## 2019-09-07 LAB — PROTIME-INR
INR: 3.8 — ABNORMAL HIGH (ref 0.8–1.2)
Prothrombin Time: 37.1 seconds — ABNORMAL HIGH (ref 11.4–15.2)

## 2019-09-07 MED ORDER — WARFARIN SODIUM 1 MG PO TABS
1.0000 mg | ORAL_TABLET | Freq: Once | ORAL | Status: AC
  Administered 2019-09-07: 1 mg via ORAL
  Filled 2019-09-07: qty 1

## 2019-09-07 NOTE — Progress Notes (Signed)
[facility] PHYSICAL MEDICINE & REHABILITATION PROGRESS NOTE   Subjective/Complaints: Washing her hair with therapy--she is very happy about this! In a very pleasant mood, joking. No complains. Denies headache or stomachache.   ROS: Patient denies fever, rash, sore throat, blurred vision, nausea, vomiting, diarrhea, cough, shortness of breath or chest pain, joint or back pain, or mood change.    Objective:   No results found. Recent Labs    09/05/19 0349 09/06/19 0533  WBC 3.9* 3.1*  HGB 12.6 13.0  HCT 38.9 40.0  PLT 146* 160   Recent Labs    09/06/19 0533  NA 140  K 3.8  CL 111  CO2 21*  GLUCOSE 104*  BUN 19  CREATININE 0.67  CALCIUM 8.5*    Intake/Output Summary (Last 24 hours) at 09/07/2019 0952 Last data filed at 09/06/2019 1835 Gross per 24 hour  Intake 120 ml  Output -  Net 120 ml     Physical Exam: Vital Signs Blood pressure 111/65, pulse 79, temperature 98.2 F (36.8 C), resp. rate 18, height 5\' 7"  (1.702 m), weight 61 kg, SpO2 98 %.  Constitutional: No distress . Vital signs reviewed. HEENT: EOMI, oral membranes moist Neck: supple Cardiovascular: RRR without murmur. No JVD    Respiratory: CTA Bilaterally without wheezes or rales. Normal effort    GI: BS +, non-tender, non-distended  Musculoskeletal:     Comments: No edema or tenderness in extremities  Neurological: She is alert and oriented x 3    Good awareness of deficits. Motor: RUE: 4+/5 proximal to distal LUE: 3+ prox to 4/5 distally. Mild left PD LLE: HF, KE 3- to 3/5, ADF limited due to AFO  Skin: Skin is warm and dry.  Psychiatric: pleasant, engaged in therapy.    Assessment/Plan: 1. 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  Physiatrist is providing close team supervision and 24 hour management of active medical problems listed below.  Physiatrist and rehab team continue to  assess barriers to discharge/monitor patient progress toward functional and medical goals  Care Tool:  Bathing    Body parts bathed by patient: Right arm, Left arm, Chest, Abdomen, Front perineal area, Buttocks, Right upper leg, Face, Left lower leg, Right lower leg, Left upper leg         Bathing assist Assist Level: Minimal Assistance - Patient > 75%     Upper Body Dressing/Undressing Upper body dressing   What is the patient wearing?: Hospital gown only    Upper body assist Assist Level: Moderate Assistance - Patient 50 - 74%    Lower Body Dressing/Undressing Lower body dressing      What is the patient wearing?: Underwear/pull up     Lower body assist Assist for lower body dressing: Minimal Assistance - Patient > 75%     Toileting Toileting    Toileting assist Assist for toileting: Minimal Assistance - Patient > 75%     Transfers Chair/bed transfer  Transfers assist     Chair/bed transfer assist level: Moderate Assistance - Patient 50 - 74%     Locomotion Ambulation   Ambulation assist      Assist level: Moderate Assistance - Patient 50 - 74% Assistive device: No Device Max distance: 69ft   Walk 10 feet activity   Assist     Assist level: Moderate Assistance - Patient - 50 - 74% Assistive device: No Device   Walk 50 feet activity  Assist    Assist level: Moderate Assistance - Patient - 50 - 74% Assistive device: No Device    Walk 150 feet activity   Assist Walk 150 feet activity did not occur: Safety/medical concerns         Walk 10 feet on uneven surface  activity   Assist Walk 10 feet on uneven surfaces activity did not occur: Safety/medical concerns         Wheelchair     Assist Will patient use wheelchair at discharge?: No(anticipate pt will be a functional ambulator)             Wheelchair 50 feet with 2 turns activity    Assist            Wheelchair 150 feet activity     Assist           Blood pressure 111/65, pulse 79, temperature 98.2 F (36.8 C), resp. rate 18, height 5\' 7"  (1.702 m), weight 61 kg, SpO2 98 %.  Medical Problem List and Plan: 1.  Increasing left side weakness secondary to minor acute extension along the anterior margin of the old infarct right parietal cortical and subcortical affecting the deep white matter and small focus adjacent cortical brain             Patient is beginning CIR therapies today including PT and OT  2.  Antithrombotics: -DVT/anticoagulation: Chronic Coumadin with INR goal 3-3.5  -INR 2.8 10/29             -antiplatelet therapy: Aspirin 81 mg daily 3. Pain Management: Tylenol as needed 4. Mood: Provide emotional support             -antipsychotic agents: N/A 5. Neuropsych: This patient is capable of making decisions on her own behalf. 6. Skin/Wound Care: Routine skin checks 7. Fluids/Electrolytes/Nutrition: Routine in and outs.  8.  Hypertension.  Norvasc 10 mg daily             controlled 10/30  9.  History of mitral valve replacement.  Continue Coumadin 10.  Hyperlipidemia.  Cont Lipitor 11. Prediabetes             Monitor with increased mobility 12. Thrombocytopenia             platelets stable to increased 160 10/29    LOS: 2 days A FACE TO FACE EVALUATION WAS PERFORMED  Clide Deutscher Paulkar 09/07/2019, 9:52 AM

## 2019-09-07 NOTE — Progress Notes (Signed)
ANTICOAGULATION CONSULT NOTE   Pharmacy Consult for Warfarin  Indication: mechanical MVR   Patient Measurements: Height: 5\' 7"  (170.2 cm) Weight: 134 lb 7.7 oz (61 kg) IBW/kg (Calculated) : 61.6  Vital Signs: Temp: 98.2 F (36.8 C) (10/30 0503) BP: 111/65 (10/30 0503) Pulse Rate: 79 (10/30 0503)  Labs: Recent Labs    09/04/19 1139 09/05/19 0349 09/06/19 0533 09/07/19 0509  HGB  --  12.6 13.0  --   HCT  --  38.9 40.0  --   PLT  --  146* 160  --   LABPROT  --  26.4* 29.1* 37.1*  INR  --  2.5* 2.8* 3.8*  HEPARINUNFRC 0.38 0.56  --   --   CREATININE  --   --  0.67  --     Estimated Creatinine Clearance: 60.3 mL/min (by C-G formula based on SCr of 0.67 mg/dL).  Assessment: 73 year old female presented to the ED with worsening left sided weakness. She is on chronic warfarin for history of a mechanical valve. INR was below goal upon admission and now today it is slightly above goal at 3.8. No bleeding noted.   PTA warfarin dose: 7.5 mg q Mon/Fri, 5 mg on all other days   Goal of Therapy:  INR 3-3.5 Monitor platelets by anticoagulation protocol: Yes   Plan:  Warfarin 1mg  PO x 1 tonight - hesitant to hold a dose since pt has a narrow goal and with an extension of her stroke when INR was below that goal Daily INR DC lovenox  Salome Arnt, PharmD, BCPS Clinical Pharmacist Please see AMION for all pharmacy numbers 09/07/2019 9:14 AM

## 2019-09-07 NOTE — Progress Notes (Signed)
Physical Therapy Session Note  Patient Details  Name: [name] MRN: [id number] Date of Birth: 1945-12-26  Today's Date: 09/07/2019 PT Individual Time: [id number] PT Individual Time Calculation (min): 51 min   Short Term Goals: Week 1:  PT Short Term Goal 1 (Week 1): Pt will perform supine<>sit with supervision PT Short Term Goal 2 (Week 1): Pt will perform sit<>stand transfers using LRAD with CGA PT Short Term Goal 3 (Week 1): Pt will perform stand pivot transfers using LRAD with CGA PT Short Term Goal 4 (Week 1): Pt will ambulate at least 116ft using LRAD with CGA PT Short Term Goal 5 (Week 1): Pt will step up/down 4" curb step using LRAD with min assist Week 2:    Week 3:     Skilled Therapeutic Interventions/Progress Updates:     Pt initially OOB in wc w/huband at bedside.  Pt transported to gym for time efficiency.  STS from wc w/min assist  Gait 27ft W RW and occasional cues to attend to R, position RLE within walker, ataxic LLE advancement., cga  Therapist educated pt re maxi ski for balance activities and pt agreed to use.  Therapist assisted pt by doning maxi ski harness w/pt standing w/min assist for balance.  Alternating tapping 2inch step which is challenging for pt, causes sign increased sway which pt is primarily able to regain balance .  Performed x 20+ reps, pt self paced and stated" I really have to focus to balance with this".  Standing balance activities in maxi sky: Standing yellow beach ball toss bimanual task to address both coordination of UE's and balance.  Pt unwilling to step out of base of support w/this task, but experienced mild balance loss on several occasions w/delayed step strategy, LAFO/weakness prevents L ankle strategy.  Standing ball kick - pt able to kick w/RLE but avoided kicking w/LLE w/task. This activity presented increased incidence of balance loss requirng increased use of step strategy/hip strategy, both delayed.    Harness removed in  sitting, mat to wc SPT w/cga and pt transported to room at end of session.  Wc to bed SPT w/min assist.  Sit to supine w/cga.   Pt left supine w/rails up x 3, alarm set, bed in lowest position, and needs in reach.    Therapy/Group: Individual Therapy  Callie Fielding, Men[place] 09/07/2019, 3:42 PM

## 2019-09-07 NOTE — Progress Notes (Signed)
Occupational Therapy Session Note  Patient Details  Name: [name] MRN: [id number] Date of Birth: 1946-01-03  Today's Date: 09/07/2019 OT Individual Time: 0800-0900 & 1000-1100 OT Individual Time Calculation (min): 60 min & 60 min   Short Term Goals: Week 1:  OT Short Term Goal 1 (Week 1): Pt will complete LB bathing with min guard assist. OT Short Term Goal 2 (Week 1): Pt will complete LB dressing with min guard assist. OT Short Term Goal 3 (Week 1): Pt will complete toilet transfer with min guard assist stand pivot OT Short Term Goal 4 (Week 1): Pt will complete FM coordination tasks for the LUE with modified independence following handout.  Skilled Therapeutic Interventions/Progress Updates:    session 1:  Patient in bed, alert and ready for therapy.  She is aware of needs, pleasant and cooperative.  Sit to stand, SPT and ambulation in room with RW to/from bed, toilet, w/c with CGA.  Sponge bath completed w/c level with set up and increased time.  UB dressing min A for bra management, OH shirt set up.  LB dressing min A, CG for clothing management, footwear min A.  toileting CGA.  Washed hair with shampoo cap min A.  Mod A for hair drying.  Oral care independent.  She remained seated in w/c at close of session.    Session 2:  Patient ready for therapy session, husband present.  Short distance ambulation with RW to/from toilet with CGA and increased time. Completed visual motor and perceptual activities due to patient complaints regarding bumping into objects during ambulation - pursuits with mild dysmetria, she noted increased strain with ant/post.  Fransisco Beau string / depth perception with suspected intermittent suppression and quickly fatigues - she also noted strain with this task.  Accomodation - increased time.  Dynavision:  In stance for 2 minutes (3 trials) 1.  Using bilateral UEs = 2.5 sec, 2.  Right UE = 1.74 sec, 3.  Left UE = 2.86 sec.  Ongoing visual motor and perceptual  activities/assessment recommended.  She returned to room, remained seated in w/c with husband visiting.    Therapy Documentation Precautions:  Precautions Precautions: Fall Restrictions Weight Bearing Restrictions: No General:   Vital Signs:   Pain: Pain Assessment Pain Scale: 0-10 Pain Score: 0-No pain   Other Treatments:     Therapy/Group: Individual Therapy  Carlos Levering 09/07/2019, 12:11 PM

## 2019-09-07 NOTE — IPOC Note (Signed)
Overall Plan of Care [facility]) Patient Details Name: MONETA MCCULLEY MRN: [id number] DOB: September 25, 1946  Admitting Diagnosis: CVA (cerebral vascular accident) [facility])  Hospital Problems: Principal Problem:   CVA (cerebral vascular accident) [facility]) Active Problems:   Subcortical infarction [facility])     Functional Problem List: Nursing Bladder, Bowel, Endurance, Medication Management, Nutrition, Pain, Safety  PT Balance, Sensory, Endurance, Motor, Nutrition, Pain  OT Balance, Endurance, Motor, Sensory  SLP    TR         Basic ADL's: OT Grooming, Bathing, Dressing, Toileting     Advanced  ADL's: OT Light Housekeeping     Transfers: PT Bed Mobility, Bed to Chair, Car, Manufacturing systems engineer, Metallurgist: PT Ambulation, Stairs     Additional Impairments: OT Fuctional Use of Upper Extremity  SLP        TR      Anticipated Outcomes Item Anticipated Outcome  Self Feeding modified independent  Swallowing      Basic self-care  supervision  Toileting  supervision   Bathroom Transfers supervision  Bowel/Bladder  Min assist to [facility]  Transfers  supervision using LRAD  Locomotion  supervision using LRAD  Communication     Cognition     Pain  <3 on a 0-10 pain scale  Safety/Judgment  Min assist while following weight bearing restrictions   Therapy Plan: PT Intensity: Minimum of 1-2 x/day ,45 to 90 minutes PT Frequency: 5 out of 7 days PT Duration Estimated Length of Stay: 1.5 - 2 weeks OT Intensity: Minimum of 1-2 x/day, 45 to 90 minutes OT Frequency: 5 out of 7 days OT Duration/Estimated Length of Stay: 12-14 days     Due to the current state of emergency, patients may not be receiving their 3-hours of Medicare-mandated therapy.   Team Interventions: Nursing Interventions Patient/Family Education, Pain Management, Bladder Management, Bowel Management, Medication Management, Discharge Planning, Psychosocial Support, Disease Management/Prevention  PT  interventions Ambulation/gait training, Community reintegration, DME/adaptive equipment instruction, Neuromuscular re-education, Psychosocial support, Stair training, UE/LE Strength taining/ROM, Training and development officer, Discharge planning, Functional electrical stimulation, Pain management, Skin care/wound management, Therapeutic Activities, UE/LE Coordination activities, Cognitive remediation/compensation, Disease management/prevention, Functional mobility training, Patient/family education, Splinting/orthotics, Therapeutic Exercise, Visual/perceptual remediation/compensation  OT Interventions Balance/vestibular training, Discharge planning, Pain management, Self Care/advanced ADL retraining, Therapeutic Activities, UE/LE Coordination activities, Therapeutic Exercise, Patient/family education, Functional mobility training, DME/adaptive equipment instruction, Community reintegration, Neuromuscular re-education, UE/LE Strength taining/ROM  SLP Interventions    TR Interventions    SW/CM Interventions Discharge Planning, Psychosocial Support, Patient/Family Education   Barriers to Discharge MD  Medical stability  Nursing Home environment access/layout, Lack of/limited family support, Weight bearing restrictions    PT      OT      SLP      SW       Team Discharge Planning: Destination: PT-Home ,OT- Home , SLP-  Projected Follow-up: PT-Home health PT, 24 hour supervision/assistance, OT-  24 hour supervision/assistance, Outpatient OT, SLP-  Projected Equipment Needs: PT-To be determined, Other (comment), OT- To be determined, SLP-  Equipment Details: PT-pt has SPC, OT-  Patient/family involved in discharge planning: PT- Patient,  OT-Patient, Family member/caregiver, SLP-   MD ELOS: 12-14 days Medical Rehab Prognosis:  Excellent Assessment: The patient has been admitted for CIR therapies with the diagnosis of subcortical infarct. The team will be addressing functional mobility, strength,  stamina, balance, safety, adaptive techniques and equipment, self-care, bowel and bladder mgt, patient and caregiver education, NMR, community reentry. Goals  have been set at supervision for self-care, ADL's and mobility.   Due to the current state of emergency, patients may not be receiving their 3 hours per day of Medicare-mandated therapy.    Meredith Staggers, MD, FAAPMR      See Team Conference Notes for weekly updates to the plan of care

## 2019-09-07 NOTE — Progress Notes (Signed)
Physical Therapy Session Note  Patient Details  Name: [name] MRN: [id number] Date of Birth: [birth year]  Today's Date: 09/07/2019 PT Individual Time: [id number] PT Individual Time Calculation (min): 40 min   Short Term Goals: Week 1:  PT Short Term Goal 1 (Week 1): Pt will perform supine<>sit with supervision PT Short Term Goal 2 (Week 1): Pt will perform sit<>stand transfers using LRAD with CGA PT Short Term Goal 3 (Week 1): Pt will perform stand pivot transfers using LRAD with CGA PT Short Term Goal 4 (Week 1): Pt will ambulate at least 158ft using LRAD with CGA PT Short Term Goal 5 (Week 1): Pt will step up/down 4" curb step using LRAD with min assist  Skilled Therapeutic Interventions/Progress Updates:   Pt received sitting in w/c and agreeable to therapy session. Transported to/from gym in w/c for time management. Sit<>stands with and without AD and CGA for steadying throughout session - pt relying heavily on pushing up with her R arm from the w/c armrest. Ambulated 170ft using RW with CGA for steadying - cuing for improved L LE coordination to improve heel strike and L LE step length. Ambulated 162ft, no AD, with min assist for balance and continued cuing for improved L LE gait mechanics. Performed dynamic standing balance and L LE NMR task of repeated L heel taps on 6" step to a target without UE support - min assist for balance throughout - mirror feedback - cuing and manual facilitation for improved upright posture (decreased L anterior trunk flexion), increased R lateral weight shifting, and increased R hip extension - pt demonstrating improvements in her motor control/coordination during 2nd set. Transported back to room in w/c and pt left seated in w/c with needs in reach and chair alarm on.   Therapy Documentation Precautions:  Precautions Precautions: Fall Restrictions Weight Bearing Restrictions: No  Pain: Denies pain during session.   Therapy/Group: Individual  Therapy  Tawana Scale, PT, DPT 09/07/2019, 4:01 PM

## 2019-09-08 ENCOUNTER — Inpatient Hospital Stay (HOSPITAL_COMMUNITY): Payer: Medicare Other

## 2019-09-08 ENCOUNTER — Inpatient Hospital Stay (HOSPITAL_COMMUNITY): Payer: Medicare Other | Admitting: Occupational Therapy

## 2019-09-08 ENCOUNTER — Encounter (HOSPITAL_COMMUNITY): Payer: Self-pay

## 2019-09-08 ENCOUNTER — Other Ambulatory Visit: Payer: Self-pay

## 2019-09-08 DIAGNOSIS — I6389 Other cerebral infarction: Secondary | ICD-10-CM

## 2019-09-08 LAB — PROTIME-INR
INR: 3.2 — ABNORMAL HIGH (ref 0.8–1.2)
Prothrombin Time: 32.2 seconds — ABNORMAL HIGH (ref 11.4–15.2)

## 2019-09-08 MED ORDER — WARFARIN SODIUM 5 MG PO TABS
5.0000 mg | ORAL_TABLET | Freq: Once | ORAL | Status: AC
  Administered 2019-09-08: 5 mg via ORAL
  Filled 2019-09-08: qty 1

## 2019-09-08 NOTE — Progress Notes (Signed)
[facility] PHYSICAL MEDICINE & REHABILITATION PROGRESS NOTE   Subjective/Complaints:   Pt reports occ/intermittent lateral hip on L pain- eating OK- sleeping well last few nights since they lowered temp in room.    ROS: Patient denies fever, rash, sore throat, blurred vision, nausea, vomiting, diarrhea, cough, shortness of breath or chest pain, joint or back pain, or mood change.    Objective:   No results found. Recent Labs    09/06/19 0533  WBC 3.1*  HGB 13.0  HCT 40.0  PLT 160   Recent Labs    09/06/19 0533  NA 140  K 3.8  CL 111  CO2 21*  GLUCOSE 104*  BUN 19  CREATININE 0.67  CALCIUM 8.5*    Intake/Output Summary (Last 24 hours) at 09/08/2019 1356 Last data filed at 09/07/2019 1850 Gross per 24 hour  Intake 236 ml  Output -  Net 236 ml     Physical Exam: Vital Signs Blood pressure 115/78, pulse 82, temperature 98 F (36.7 C), resp. rate 18, height 5\' 7"  (1.702 m), weight 61 kg, SpO2 96 %.  Constitutional: No distress . Vital signs reviewed. Pt sitting in chair at bedside; husband and nurse in room, NAD HEENT: EOMI, oral membranes moist Neck: supple Cardiovascular: RRR without murmur. No JVD    Respiratory: CTA Bilaterally without wheezes or rales. Normal effort    GI: BS +, non-tender, non-distended  Musculoskeletal:     Comments: No edema or tenderness in extremities ; TTP over L trochanteric bursa Neurological: She is alert and oriented x 3    Good awareness of deficits. Motor: RUE: 4+/5 proximal to distal LUE: 3+ prox to 4/5 distally. Mild left PD LLE: HF, KE 3- to 3/5, ADF limited due to AFO  Skin: Skin is warm and dry. Bruises on UEs B/L from blood draws/IVs Psychiatric: pleasant, engaged in therapy.    Assessment/Plan: 1. 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  Physiatrist is providing close team supervision and 24 hour  management of active medical problems listed below.  Physiatrist and rehab team continue to assess barriers to discharge/monitor patient progress toward functional and medical goals  Care Tool:  Bathing    Body parts bathed by patient: Right arm, Left arm, Chest, Abdomen, Front perineal area, Buttocks, Right upper leg, Face, Left lower leg, Right lower leg, Left upper leg         Bathing assist Assist Level: Minimal Assistance - Patient > 75%     Upper Body Dressing/Undressing Upper body dressing   What is the patient wearing?: Hospital gown only    Upper body assist Assist Level: Minimal Assistance - Patient > 75%    Lower Body Dressing/Undressing Lower body dressing      What is the patient wearing?: Underwear/pull up     Lower body assist Assist for lower body dressing: Contact Guard/Touching assist     Toileting Toileting    Toileting assist Assist for toileting: Minimal Assistance - Patient > 75%     Transfers Chair/bed transfer  Transfers assist     Chair/bed transfer assist level: Contact Guard/Touching assist     Locomotion Ambulation   Ambulation assist      Assist level: Minimal Assistance - Patient > 75% Assistive device: No Device Max distance: 200 ft   Walk 10 feet activity   Assist     Assist level: Minimal Assistance - Patient > 75% Assistive  device: No Device   Walk 50 feet activity   Assist    Assist level: Minimal Assistance - Patient > 75% Assistive device: No Device    Walk 150 feet activity   Assist Walk 150 feet activity did not occur: Safety/medical concerns  Assist level: Minimal Assistance - Patient > 75% Assistive device: No Device    Walk 10 feet on uneven surface  activity   Assist Walk 10 feet on uneven surfaces activity did not occur: Safety/medical concerns         Wheelchair     Assist Will patient use wheelchair at discharge?: No(anticipate pt will be a functional ambulator)              Wheelchair 50 feet with 2 turns activity    Assist            Wheelchair 150 feet activity     Assist          Blood pressure 115/78, pulse 82, temperature 98 F (36.7 C), resp. rate 18, height 5\' 7"  (1.702 m), weight 61 kg, SpO2 96 %.  Medical Problem List and Plan: 1.  Increasing left side weakness secondary to minor acute extension along the anterior margin of the old infarct right parietal cortical and subcortical affecting the deep white matter and small focus adjacent cortical brain             Patient is beginning CIR therapies today including PT and OT  2.  Antithrombotics: -DVT/anticoagulation: Chronic Coumadin with INR goal 3-3.5  -INR 3.2 10/31             -antiplatelet therapy: Aspirin 81 mg daily 3. Pain Management: Tylenol as needed 4. Mood: Provide emotional support             -antipsychotic agents: N/A 5. Neuropsych: This patient is capable of making decisions on her own behalf. 6. Skin/Wound Care: Routine skin checks 7. Fluids/Electrolytes/Nutrition: Routine in and outs.  8.  Hypertension.  Norvasc 10 mg daily             controlled 10/30  9.  History of mitral valve replacement.  Continue Coumadin 10.  Hyperlipidemia.  Cont Lipitor 11. Prediabetes             Monitor with increased mobility 12. Thrombocytopenia             platelets stable to increased 160 10/29    LOS: 3 days A FACE TO FACE EVALUATION WAS PERFORMED  Emmons Toth 09/08/2019, 1:56 PM

## 2019-09-08 NOTE — Progress Notes (Signed)
Occupational Therapy Session Note  Patient Details  Name: [name] MRN: [id number] Date of Birth: [birth year]  Today's Date: 09/08/2019 OT Individual Time: 1300-1408 OT Individual Time Calculation (min): 68 min    Short Term Goals: Week 1:  OT Short Term Goal 1 (Week 1): Pt will complete LB bathing with min guard assist. OT Short Term Goal 2 (Week 1): Pt will complete LB dressing with min guard assist. OT Short Term Goal 3 (Week 1): Pt will complete toilet transfer with min guard assist stand pivot OT Short Term Goal 4 (Week 1): Pt will complete FM coordination tasks for the LUE with modified independence following handout.  Skilled Therapeutic Interventions/Progress Updates: focus of session as follows:  Toilet transfer via RW in room (Min A to advance left leg for functional ambulation from w/c in room to toilet)  toileting = extra time to gain balance to utilize one handed technique to wipe front and then reach back to buttock and maintain balance and Min A for helping place right hand correctly while stabilizing left hand properly onto her walker for balance  Other sit to stand activities with correct postural alignment during the sit to stand phase (Min A to S, fluctuated with c/o fatigue) Dynamic standing tasks at walker to complete neuro re education with left upper extremity to cross midline to place items in front and with PNF diagonal one onto her right side of body and environment.   Required moderate assistance for balance and to faciltate left digital motorcontrol and handling of left upper extremity to facilitate increased range of motion and strength  As well for static and dynamic standing tasks, patient required continuous reminders for safe of base of support rather than standing with both feet too close together.  As well, patient posture somewhat kyphotic and with decrease core stability and required assist to attempt upright postural alignment static and  dynamically  Patient was cued to look at her left digits during neuro re education to help her with tip to tip pinch when appropriate rather than lateral pinch.   Patient with difficulty producing tip to tip pinch/grasp;however, when she actually attended to her digits during tasks, she was able to do so.  Patient was left with items in her room to work on to help facilittate left hand coordination, dexterity, motor control and pincer grasp  Continue OT Plance of Care       Therapy Documentation Precautions:  Precautions Precautions: Fall Restrictions Weight Bearing Restrictions: No Pain:denied    Therapy/Group: Individual Therapy  Alfredia Ferguson [facility] 09/08/2019, 4:58 PM

## 2019-09-08 NOTE — Progress Notes (Addendum)
Physical Therapy Session Note  Patient Details  Name: [name] MRN: [id number] Date of Birth: 06/26/1946  Today's Date: 09/08/2019 PT Individual Time: 0901-1000 and [id number] PT Individual Time Calculation (min): 59 min and 60 min    Short Term Goals: Week 1:  PT Short Term Goal 1 (Week 1): Pt will perform supine<>sit with supervision PT Short Term Goal 2 (Week 1): Pt will perform sit<>stand transfers using LRAD with CGA PT Short Term Goal 3 (Week 1): Pt will perform stand pivot transfers using LRAD with CGA PT Short Term Goal 4 (Week 1): Pt will ambulate at least 130ft using LRAD with CGA PT Short Term Goal 5 (Week 1): Pt will step up/down 4" curb step using LRAD with min assist  Skilled Therapeutic Interventions/Progress Updates:    Session: Pt seated EOB working on lower body dressing with NT. Pt donned shoes and L AFO with min assist from therapist, CGA for sit<>stand with RW to pull pants over hips. Pt ambulated x 200 ft to the gym this session with RW and CGA, cues for upright posture and increased step length. Pt worked on dynamic standing balance and coordination without UE support to perform the following tasks: toe taps on 6 inch step, sidesteps in place 2 x 10 per LE, sidestepping within parallel bars 3 x 10 ft in each direction. Pt performed 2 x 10 sit<>stands from the mat without UE support for LE strengthening, cues for techniques and anterior weightshift, min assist. Pt also educated in importance of LE strengthening as part of HEP, will provide handout. Pt ambulated back to the room x 200 ft without AD and with min assist, facilitation at the hips for increased weightshift and pelvic rotation. Pt left in w/c at end of session with needs in reach and chair alarm set.   Session 2: Pt supine in bed upon PT arrival, agreeable to therapy tx and denies pain. Pt transferred to sitting EOB with supervision. Pt ambulated from room>gym x 200 ft without AD and with min assist,  facilitation at the hips for increased weightshift and pelvic rotation. Pt worked on hip strengthening this session to perform the following exercises while standing at parallel bars, 2 x 10 each with cues for techniques: standing hip abduction and calf raises. Pt attempted to transfer into quadruped, min-mod assist with pt becoming very fearful with this position, therapist assisted pt back to sitting. Pt agreeable to try tall kneeling with UE support on the bench, pt transferred to tall kneeling with min assist, once in the position pt continues to be somewhat fearful and anxious. Pt maintained tall kneeling x 5 min working on postural control and hip strength, pt worked on lateral weightshifting and raising arms off UE support. Pt transferred back to sitting and became tearful/anxious, pt reports she felt like she was "not in control" while in tall kneeling position and felt like she was going to fall, therapist provided emotional support. Pt transferred to supine and performed supine core and LE strengthening, 2 x 10 each with cues for techniques: bridges, hip abduction with theraband, alternating hip flexion with cues for abdominal activation. Pt ambulated back to room and left supine in bed with needs in reach and bed alarm set.    Therapy Documentation Precautions:  Precautions Precautions: Fall Restrictions Weight Bearing Restrictions: No   Therapy/Group: Individual Therapy  Netta Corrigan, PT, DPT, CSRS 09/08/2019, 7:58 AM

## 2019-09-08 NOTE — Progress Notes (Signed)
[place] for Warfarin  Indication: mechanical MVR   Patient Measurements: Height: 5\' 7"  (170.2 cm) Weight: 134 lb 7.7 oz (61 kg) IBW/kg (Calculated) : 61.6  Vital Signs: Temp: 98 F (36.7 C) (10/31 0351) BP: 115/78 (10/31 0351) Pulse Rate: 82 (10/31 0351)  Labs: Recent Labs    09/06/19 0533 09/07/19 0509 09/08/19 0558  HGB 13.0  --   --   HCT 40.0  --   --   PLT 160  --   --   LABPROT 29.1* 37.1* 32.2*  INR 2.8* 3.8* 3.2*  CREATININE 0.67  --   --     Estimated Creatinine Clearance: 60.3 mL/min (by C-G formula based on SCr of 0.67 mg/dL).  Assessment: 73 year old female presented to the ED with worsening left sided weakness. She is on chronic warfarin for history of a mechanical valve.   INR today is therapeutic at 3.2 - after reduced dose last night of 1 mg. Elevated INR likely second to elevated doses previously. Last CBC stable, no s/sx of bleeding.   PTA warfarin dose: 7.5 mg q Mon/Fri, 5 mg on all other days  Goal of Therapy:  INR 3-3.5 Monitor platelets by anticoagulation protocol: Yes   Plan:  Warfarin 5 mg tonight Daily INR  Antonietta Jewel, PharmD, BCCCP Clinical Pharmacist  Phone: [phone]  Please check AMION for all [place] phone numbers After 10:00 PM, call [place] [phone] 09/08/2019 10:28 AM

## 2019-09-09 ENCOUNTER — Inpatient Hospital Stay (HOSPITAL_COMMUNITY): Payer: Medicare Other | Admitting: Physical Therapy

## 2019-09-09 LAB — PROTIME-INR
INR: 2.6 — ABNORMAL HIGH (ref 0.8–1.2)
Prothrombin Time: 27.5 seconds — ABNORMAL HIGH (ref 11.4–15.2)

## 2019-09-09 LAB — CBC
HCT: 39.7 % (ref 36.0–46.0)
Hemoglobin: 13 g/dL (ref 12.0–15.0)
MCH: 29.5 pg (ref 26.0–34.0)
MCHC: 32.7 g/dL (ref 30.0–36.0)
MCV: 90.2 fL (ref 80.0–100.0)
Platelets: 166 10*3/uL (ref 150–400)
RBC: 4.4 MIL/uL (ref 3.87–5.11)
RDW: 14 % (ref 11.5–15.5)
WBC: 3.5 10*3/uL — ABNORMAL LOW (ref 4.0–10.5)
nRBC: 0 % (ref 0.0–0.2)

## 2019-09-09 MED ORDER — ENOXAPARIN SODIUM 60 MG/0.6ML ~~LOC~~ SOLN
1.0000 mg/kg | Freq: Two times a day (BID) | SUBCUTANEOUS | Status: DC
  Administered 2019-09-09 – 2019-09-10 (×4): 60 mg via SUBCUTANEOUS
  Filled 2019-09-09 (×5): qty 0.6

## 2019-09-09 MED ORDER — WARFARIN SODIUM 7.5 MG PO TABS
7.5000 mg | ORAL_TABLET | Freq: Once | ORAL | Status: AC
  Administered 2019-09-09: 7.5 mg via ORAL
  Filled 2019-09-09: qty 1

## 2019-09-09 NOTE — Progress Notes (Addendum)
[place] for Warfarin  Indication: mechanical MVR   Patient Measurements: Height: 5\' 7"  (170.2 cm) Weight: 134 lb 7.7 oz (61 kg) IBW/kg (Calculated) : 61.6  Vital Signs: Temp: 97.7 F (36.5 C) (11/01 0236) Temp Source: Oral (11/01 0236) BP: 120/71 (11/01 0236) Pulse Rate: 61 (11/01 0236)  Labs: Recent Labs    09/07/19 0509 09/08/19 0558 09/09/19 0446  HGB  --   --  13.0  HCT  --   --  39.7  PLT  --   --  166  LABPROT 37.1* 32.2* 27.5*  INR 3.8* 3.2* 2.6*    Estimated Creatinine Clearance: 60.3 mL/min (by C-G formula based on SCr of 0.67 mg/dL).  Assessment: 73 year old female presented to the ED with worsening left sided weakness. She is on chronic warfarin for history of a mechanical valve.   INR today is subtherapeutic at 2.6 likely related to reduced dosing when INR was supratherpaeutic. Hgb 13, plt 166, no s/sx of bleeding.   PTA warfarin dose: 7.5 mg q Mon/Fri, 5 mg on all other days  Goal of Therapy:  INR 3-3.5 Monitor platelets by anticoagulation protocol: Yes   Plan:  Warfarin 7.5 mg tonight Daily INR Starting enoxaparin 60 mg subQ every 12 hours given subtherapeutic INR  Antonietta Jewel, PharmD, BCCCP Clinical Pharmacist  Phone: [phone]  Please check AMION for all [place] phone numbers After 10:00 PM, call [place] [phone] 09/09/2019 10:54 AM

## 2019-09-09 NOTE — Progress Notes (Signed)
Physical Therapy Session Note  Patient Details  Name: [name] MRN: [id number] Date of Birth: 26-Feb-1946  Today's Date: 09/09/2019 PT Individual Time: [phone] and [id number] PT Individual Time Calculation (min): 64 min and 63 min  Short Term Goals: Week 1:  PT Short Term Goal 1 (Week 1): Pt will perform supine<>sit with supervision PT Short Term Goal 2 (Week 1): Pt will perform sit<>stand transfers using LRAD with CGA PT Short Term Goal 3 (Week 1): Pt will perform stand pivot transfers using LRAD with CGA PT Short Term Goal 4 (Week 1): Pt will ambulate at least 139ft using LRAD with CGA PT Short Term Goal 5 (Week 1): Pt will step up/down 4" curb step using LRAD with min assist  Skilled Therapeutic Interventions/Progress Updates:   Session 1: Pt received supine in bed and agreeable to therapy session. Supine>sitting EOB with HOB partially elevated and supervision for safety. Sitting EOB performed UB and LB dressing with supervision and min assist for sit<>stands to finish donning pants - max assist for TED hose, socks, and shoes for time management. Ambulated ~245ft, no AD, to therapy gym with min assist for balance and cuing for increased L LE step length, decreased hip abduction/circumduction compensation, and increased L UE arm swing. Performed dynamic standing balance and L L NMR task of L heel taps to external target focusing on increased hip flexion (decreased circumduction), hip/knee flexion followed by knee extension to replicate gait mechanics, and increased heel strike; 2 sets to pt fatigue - min assist for balance with 3x mod assist due to posterior LOB - mirror feedback throughout. Performed seated hip abduction x15 reps against level 3 green theraband resistance. Repeated sit<>stand focused on increased L weight shift and maintaining L LE hip abduction x15 reps with min assist for balance - mirror feedback for improved form. Ambulated ~252ft back to room, no AD, with min assist for  balance and again multimodal cuing and manual facilitation for improving the above gait mechanics. Pt reporting need to use bathroom. Ambulated ~41ft to bathroom using RW with CGA for steadying. Pt left seated on toilet with RN entering to assume care of patient.  Session 2: Pt received sitting EOB and is eager to participate in therapy session. Pt wearing L LE AFO. Patient reports that she would like to try the tall kneeling yesterday because she believes she had a break down with it yesterday and wants to try and tackle it today. Sit>stand, no AD, with min assist with focus on increased L LE weight shift and increased L lateral and anterior trunk lean. Ambulated ~216ft, no AD, to main therapy gym with min assist for balance and continued cuing and manual facilitation for increased L LE step length, decreased circumduction via increased hip flexion, increased pelvic movement, and increased L UE arm swing. Standing>tall kneeling on mat table with mod assist for achieving position with pt being slightly fearful but wanting to continue the exercise. While in tall kneeling focused on increased L hip extension and L lateral weightshifting with mirror feedback and max multimodal cuing. Tall kneeling>standing with mod assist for exiting position - pt demonstrates some ataxia in UE or trunk when in quadruped type position. Standing, no UE support, L LE NMR task focusing on stance phase with increased hip/glute muscle activation while stepping up/down on 6" step with R LE - min assist for balance. Sit<>supine on bed with supervision. Supine bridging x15 reps with max multimodal cuing for maintaining level hips as pt demonstrates decreased L  hip raise causing her knees to fall towards the L. Repeated sit<>stands x20 targeting increased L anterior trunk weight shift with increased L LE weight bearing as pt demonstrates significant compensatory sit<>stand strategy relying completely on R UE and R LE - max multimodal cuing  and manual facilitation for improvements using mirror feedback. Ambulated ~229ft back to room, noAD, with min assist for balance and continued cuing for the above gait impairments. Pt left seated in w/c with needs in reach and chair alarm on.   Therapy Documentation Precautions:  Precautions Precautions: Fall Restrictions Weight Bearing Restrictions: No  Pain: Session 1: No reports of pain.  Session 2: Reports minor L knee pain after performing tall kneeling task - defers intervention.    Therapy/Group: Individual Therapy  Tawana Scale, PT, DPT 09/09/2019, 7:42 AM

## 2019-09-09 NOTE — Progress Notes (Signed)
. [facility] PHYSICAL MEDICINE & REHABILITATION PROGRESS NOTE   Subjective/Complaints:   Pt reports NO issues except INR 2.6- added Tx dose lovenox.   ROS: Patient denies fever, rash, sore throat, blurred vision, nausea, vomiting, diarrhea, cough, shortness of breath or chest pain, joint or back pain, or mood change.    Objective:   No results found. Recent Labs    09/09/19 0446  WBC 3.5*  HGB 13.0  HCT 39.7  PLT 166   No results for input(s): NA, K, CL, CO2, GLUCOSE, BUN, CREATININE, CALCIUM in the last 72 hours.  Intake/Output Summary (Last 24 hours) at 09/09/2019 1411 Last data filed at 09/09/2019 0806 Gross per 24 hour  Intake 120 ml  Output -  Net 120 ml     Physical Exam: Vital Signs Blood pressure 120/71, pulse 61, temperature 97.7 F (36.5 C), temperature source Oral, resp. rate 16, height 5\' 7"  (1.702 m), weight 61 kg, SpO2 96 %.  Constitutional: No distress . Vital signs reviewed. Pt sitting on mat in gym, NAD HEENT: EOMI, oral membranes moist Neck: supple Cardiovascular: RRR without murmur. No JVD    Respiratory: CTA Bilaterally without wheezes or rales. Normal effort    GI: BS +, non-tender, non-distended  Musculoskeletal:     Comments: No edema or tenderness in extremities ; TTP over L trochanteric bursa Neurological: She is alert and oriented x 3    Good awareness of deficits. Motor: RUE: 4+/5 proximal to distal LUE: 3+ prox to 4/5 distally. Mild left PD LLE: HF, KE 3- to 3/5, ADF limited due to AFO  Skin: Skin is warm and dry. Bruises on UEs B/L from blood draws/IVs Psychiatric: pleasant, engaged in therapy.    Assessment/Plan: 1. 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  Physiatrist is providing close team supervision and 24 hour management of active medical problems listed below.  Physiatrist and rehab team continue to assess barriers  to discharge/monitor patient progress toward functional and medical goals  Care Tool:  Bathing    Body parts bathed by patient: Right arm, Left arm, Chest, Abdomen, Front perineal area, Buttocks, Right upper leg, Face, Left lower leg, Right lower leg, Left upper leg         Bathing assist Assist Level: Minimal Assistance - Patient > 75%     Upper Body Dressing/Undressing Upper body dressing   What is the patient wearing?: Hospital gown only    Upper body assist Assist Level: Minimal Assistance - Patient > 75%    Lower Body Dressing/Undressing Lower body dressing      What is the patient wearing?: Underwear/pull up     Lower body assist Assist for lower body dressing: Contact Guard/Touching assist     Toileting Toileting    Toileting assist Assist for toileting: Moderate Assistance - Patient 50 - 74%     Transfers Chair/bed transfer  Transfers assist     Chair/bed transfer assist level: Contact Guard/Touching assist     Locomotion Ambulation   Ambulation assist      Assist level: Minimal Assistance - Patient > 75% Assistive device: No Device Max distance: 200 ft   Walk 10 feet activity   Assist     Assist level: Minimal Assistance - Patient > 75% Assistive device: No Device   Walk 50 feet activity   Assist    Assist level: Minimal Assistance - Patient > 75% Assistive device: No Device  Walk 150 feet activity   Assist Walk 150 feet activity did not occur: Safety/medical concerns  Assist level: Minimal Assistance - Patient > 75% Assistive device: No Device    Walk 10 feet on uneven surface  activity   Assist Walk 10 feet on uneven surfaces activity did not occur: Safety/medical concerns         Wheelchair     Assist Will patient use wheelchair at discharge?: No(anticipate pt will be a functional ambulator)             Wheelchair 50 feet with 2 turns activity    Assist            Wheelchair 150 feet  activity     Assist          Blood pressure 120/71, pulse 61, temperature 97.7 F (36.5 C), temperature source Oral, resp. rate 16, height 5\' 7"  (1.702 m), weight 61 kg, SpO2 96 %.  Medical Problem List and Plan: 1.  Increasing left side weakness secondary to minor acute extension along the anterior margin of the old infarct right parietal cortical and subcortical affecting the deep white matter and small focus adjacent cortical brain             Patient is beginning CIR therapies today including PT and OT  2.  Antithrombotics: -DVT/anticoagulation: Chronic Coumadin with INR goal 3-3.5  -INR 3.2 10/31  11/1- INR 2.6- Tx dose lovenox until INR therapueutic             -antiplatelet therapy: Aspirin 81 mg daily 3. Pain Management: Tylenol as needed 4. Mood: Provide emotional support             -antipsychotic agents: N/A 5. Neuropsych: This patient is capable of making decisions on her own behalf. 6. Skin/Wound Care: Routine skin checks 7. Fluids/Electrolytes/Nutrition: Routine in and outs.  8.  Hypertension.  Norvasc 10 mg daily             controlled 10/30  9.  History of mitral valve replacement.  Continue Coumadin 10.  Hyperlipidemia.  Cont Lipitor 11. Prediabetes             Monitor with increased mobility 12. Thrombocytopenia             platelets stable to increased 160 10/29    LOS: 4 days A FACE TO FACE EVALUATION WAS PERFORMED  [name] 09/09/2019, 2:11 PM

## 2019-09-10 ENCOUNTER — Inpatient Hospital Stay (HOSPITAL_COMMUNITY): Payer: Medicare Other

## 2019-09-10 ENCOUNTER — Inpatient Hospital Stay (HOSPITAL_COMMUNITY): Payer: Medicare Other | Admitting: Occupational Therapy

## 2019-09-10 LAB — PROTIME-INR
INR: 2.8 — ABNORMAL HIGH (ref 0.8–1.2)
Prothrombin Time: 29 seconds — ABNORMAL HIGH (ref 11.4–15.2)

## 2019-09-10 MED ORDER — WARFARIN SODIUM 7.5 MG PO TABS
7.5000 mg | ORAL_TABLET | Freq: Once | ORAL | Status: AC
  Administered 2019-09-10: 7.5 mg via ORAL
  Filled 2019-09-10: qty 1

## 2019-09-10 NOTE — Plan of Care (Signed)
  Problem: Consults Goal: RH STROKE PATIENT EDUCATION Description: See Patient Education module for education specifics  Outcome: Progressing Goal: Nutrition Consult-if indicated Outcome: Progressing   Problem: RH BOWEL ELIMINATION Goal: RH STG MANAGE BOWEL WITH ASSISTANCE Description: STG Manage Bowel with supervision Assistance. Outcome: Progressing Goal: RH STG MANAGE BOWEL W/MEDICATION W/ASSISTANCE Description: STG Manage Bowel with Medication with supervision Assistance. Outcome: Progressing   Problem: RH BLADDER ELIMINATION Goal: RH STG MANAGE BLADDER WITH ASSISTANCE Description: STG Manage Bladder With supervision Assistance Outcome: Progressing   Problem: RH SKIN INTEGRITY Goal: RH STG SKIN FREE OF INFECTION/BREAKDOWN Description: Skin to remain free from breakdown while on rehab with min assist. Outcome: Progressing Goal: RH STG MAINTAIN SKIN INTEGRITY WITH ASSISTANCE Description: STG Maintain Skin Integrity With min Assistance. Outcome: Progressing Goal: RH STG ABLE TO PERFORM INCISION/WOUND CARE W/ASSISTANCE Description: STG Able To Perform Incision/Wound Care With min Assistance. Outcome: Progressing   Problem: RH SAFETY Goal: RH STG ADHERE TO SAFETY PRECAUTIONS W/ASSISTANCE/DEVICE Description: STG Adhere to Safety Precautions With supervision Assistance and appropriate assistive Device. Outcome: Progressing   Problem: RH PAIN MANAGEMENT Goal: RH STG PAIN MANAGED AT OR BELOW PT'S PAIN GOAL Description: <3 on a 0-10 pain scale. Outcome: Progressing   Problem: RH KNOWLEDGE DEFICIT Goal: RH STG INCREASE KNOWLEDGE OF HYPERTENSION Description: Patient will be able to demonstrate knowledge of HTN, medications, BP parameters, and dietary restrictions with min assist from rehab staff. Outcome: Progressing Goal: RH STG INCREASE KNOWLEGDE OF HYPERLIPIDEMIA Description: Patient will demonstrate knowledge of HLD medications and dietary restrictions with min assist from  rehab staff. Outcome: Progressing Goal: RH STG INCREASE KNOWLEDGE OF STROKE PROPHYLAXIS Description: Patient will demonstrate knowledge of stroke prevention medications with min assist from rehab staff. Outcome: Progressing

## 2019-09-10 NOTE — Progress Notes (Signed)
Occupational Therapy Session Note  Patient Details  Name: [name] MRN: [id number] Date of Birth: 1946/04/20  Today's Date: 09/10/2019 OT Individual Time: [id number] OT Individual Time Calculation (min): 73 min    Short Term Goals: Week 1:  OT Short Term Goal 1 (Week 1): Pt will complete LB bathing with min guard assist. OT Short Term Goal 2 (Week 1): Pt will complete LB dressing with min guard assist. OT Short Term Goal 3 (Week 1): Pt will complete toilet transfer with min guard assist stand pivot OT Short Term Goal 4 (Week 1): Pt will complete FM coordination tasks for the LUE with modified independence following handout.  Skilled Therapeutic Interventions/Progress Updates:    Pt completed supine to sit EOB with supervision to start session.  She then worked on donning her shoes as well as her left AFO brace.  She then completed toilet transfer with min assist without an assistive device.  She was able to perform clothing management and toilet hygiene with min guard sit to stand.  Once completed, she washed her hands at the sink and then ambulated down to the therapy kitchen with min assist and no device.  Therapist supplied therapy putty as pt expressed decreased ability to cut up her own food.  Educated pt on use of the LUE for stabilizing her food with a fork while cutting it with the RUE.  Therapist provided larger handle fork as well as fork with a foam grip on the utensil in order for her to maintain a better grip on the fork.  She was able to cut up the putty with increased time and mod instructional cueing for correct hand placement with the fork.  Attempted initially for pt to work on using the knife in the left hand to cut with, but based on current weakness and limitations, feel this is not a safe option.  Did her her complete simulated scooping with the left hand after cutting.  Finished session with ambulation back to the room and pt left sitting up in the wheelchair with her  spouse present and call button and phone in reach.  Safety chair alarm in place as well.        Therapy Documentation Precautions:  Precautions Precautions: Fall Restrictions Weight Bearing Restrictions: No  Pain: Pain Assessment Pain Scale: Faces Pain Score: 0-No pain ADL: See Care Tool Section for some details of selfcare and mobility tasks.  Therapy/Group: Individual Therapy  Lailah Marcelli OTR/L 09/10/2019, 3:47 PM

## 2019-09-10 NOTE — Progress Notes (Signed)
ANTICOAGULATION CONSULT NOTE   Pharmacy Consult for Warfarin  Indication: mechanical MVR   Patient Measurements: Height: 5\' 7"  (170.2 cm) Weight: 134 lb 7.7 oz (61 kg) IBW/kg (Calculated) : 61.6  Vital Signs: Temp: 98.2 F (36.8 C) (11/02 0300) Temp Source: Oral (11/02 0300) BP: 129/69 (11/02 0300) Pulse Rate: 62 (11/02 0300)  Labs: Recent Labs    09/08/19 0558 09/09/19 0446 09/10/19 0510  HGB  --  13.0  --   HCT  --  39.7  --   PLT  --  166  --   LABPROT 32.2* 27.5* 29.0*  INR 3.2* 2.6* 2.8*    Estimated Creatinine Clearance: 60.3 mL/min (by C-G formula based on SCr of 0.67 mg/dL).  Assessment: 73 year old female presented to the ED with worsening left sided weakness. She is on chronic warfarin for history of a mechanical valve.   INR today is subtherapeutic at 2.6 likely related to reduced dosing when INR was supratherpaeutic. Hgb 13, plt 166, no s/sx of bleeding.   PTA warfarin dose: 7.5 mg q Mon/Fri, 5 mg on all other days  Goal of Therapy:  INR 3-3.5 Monitor platelets by anticoagulation protocol: Yes   Plan:  Warfarin 7.5 mg again tonight Daily INR Continue enoxaparin 60 mg subQ every 12 hours given subtherapeutic INR  Quenisha Lovins A. Levada Dy, PharmD, BCPS, FNKF Clinical Pharmacist [place] Please utilize Amion for appropriate phone number to reach the unit pharmacist ([place])   09/10/2019 10:10 AM

## 2019-09-10 NOTE — Progress Notes (Signed)
Physical Therapy Session Note  Patient Details  Name: [name] MRN: [id number] Date of Birth: December 06, 1945  Today's Date: 09/10/2019 PT Individual Time: 0803-0900 and 1000-1100 and 1515-1545 PT Individual Time Calculation (min): 57 min and 60 min and 30 min   Short Term Goals: Week 1:  PT Short Term Goal 1 (Week 1): Pt will perform supine<>sit with supervision PT Short Term Goal 2 (Week 1): Pt will perform sit<>stand transfers using LRAD with CGA PT Short Term Goal 3 (Week 1): Pt will perform stand pivot transfers using LRAD with CGA PT Short Term Goal 4 (Week 1): Pt will ambulate at least 124ft using LRAD with CGA PT Short Term Goal 5 (Week 1): Pt will step up/down 4" curb step using LRAD with min assist  Skilled Therapeutic Interventions/Progress Updates:   Session 1:  Pt using the bathroom upon PT arrival, agreeable to therapy tx and denies pain. Pt finished toileting, continent of bowel and bladder, performed pericare without assist, sit<>stand with min assist for clothing management. Pt ambulated to the sink with RW and CGA, standing balance at the sink with CGA while washing hands and brushing hair. Pt ambulated to the bed, seated EOB performed upper and lower body dressing with assist to don teds. Min assist for sit<>stand to pull pants over hips. Pt ambulated room<> gym 2 x 200 ft this session without AD and with min assist, manual facilitation for pelvic rotation and cues for arm swing. Pt performed standing therex at the counter for strengthening, including 2 x 10 of each with cues for techniques: hip abduction and hamstring curls. Pt worked on standing balance and coordination without UE support to perform toe taps on 6 inch step, x 2 trials with min assist. Pt worked on stepping strategy training this session to perform backwards stepping, x 1 trial per LE with timed perturbations and x 1 trial per LE with random perturbations, cues for techniques and large step back. Pt left in w/c  at end of session with needs in reach and chair alarm set.    Session 2: Pt seated in w/c upon PT arrival, agreeable to therapy tx and denies pain, transported to the dayroom for time management. Pt participated in treadmill training this session with use of litegait harness system, min assist +2 to step on/off treadmill and CGA for standing balance while donning/doffing harness. Gait training as follows: Trial 1- 8:35 minutes, 0.8-1.0 mph, 662 ft with UE support with cues for L foot clearance, and occasional facilitation for increased L knee flexion during swing Trial 2- 3:32 minutes, 0.7 mph at incline of 5, x 222 ft with UE support with facilitation for increased knee flexion during swing to limit circumduction Trial 3- 2:42 minutes, 0.8 mph, x172 ft without UE support with manual facilitation for lateral weightshifting and pelvic rotation Pt continued to work on backwards stepping strategy training this session- x 1 trial per LE with timed perturbations and x 1 trial per LE with random perturbations, cues for techniques and large step back.  Pt transported back to room at end of session and transferred to bed with min assist, left supine with needs in reach and bed alarm set.   Session 3: Pt seated in w/c upon PT arrival, agreeable to therapy tx and denies pain. Pt ambulated from room<>gym this session with SPC and CGA, cues for increased step length and L hip/knee flexion during swing. Pt worked on LE strength to perform x 10 sit<>stands with hands on L knee  to increase L weightshift, CGA-min assist. Pt worked on sidestepping strategies this session to perform sidesteps in response to perturbations x 10 per side. Pt worked on foot clearance and dynamic balance to ambulate through agility ladder x 4 trials without UE support and stepping one foot per square. Pt left in w/c at end of session with needs in reach and chair alarm set.    Therapy Documentation Precautions:  Precautions Precautions:  Fall Restrictions Weight Bearing Restrictions: No    Therapy/Group: Individual Therapy  Netta Corrigan, PT, DPT, CSRS 09/10/2019, 7:52 AM

## 2019-09-10 NOTE — Progress Notes (Signed)
. [facility] PHYSICAL MEDICINE & REHABILITATION PROGRESS NOTE   Subjective/Complaints:  Pt not aware of hx of diabetes, is diet controlled with HgbA1C on 10/27 of 6.1 Discussed extension of stroke as well as her risk factors  INR slightly improved at 2.8 with goal of 3-3.5  ROS: Patient denies fever, rash, sore throat, blurred vision, nausea, vomiting, diarrhea, cough, shortness of breath or chest pain, joint or back pain, or mood change.    Objective:   No results found. Recent Labs    09/09/19 0446  WBC 3.5*  HGB 13.0  HCT 39.7  PLT 166   No results for input(s): NA, K, CL, CO2, GLUCOSE, BUN, CREATININE, CALCIUM in the last 72 hours.  Intake/Output Summary (Last 24 hours) at 09/10/2019 0858 Last data filed at 09/10/2019 0800 Gross per 24 hour  Intake 240 ml  Output -  Net 240 ml     Physical Exam: Vital Signs Blood pressure 129/69, pulse 62, temperature 98.2 F (36.8 C), temperature source Oral, resp. rate 18, height 5\' 7"  (1.702 m), weight 61 kg, SpO2 97 %.  Constitutional: No distress . Vital signs reviewed. Pt sitting on mat in gym, NAD HEENT: EOMI, oral membranes moist Neck: supple Cardiovascular: RRR without murmur. No JVD    Respiratory: CTA Bilaterally without wheezes or rales. Normal effort    GI: BS +, non-tender, non-distended  Musculoskeletal:     Comments: No edema or tenderness in extremities ; TTP over L trochanteric bursa Neurological: She is alert and oriented x 3    Good awareness of deficits. Motor: RUE: 4+/5 proximal to distal LUE: 3+ prox to 4/5 distally. Mild left PD LLE: HF, KE 3- to 3/5, ADF limited due to AFO  Skin: Skin is warm and dry. Bruises on UEs B/L from blood draws/IVs Psychiatric: pleasant, engaged in therapy.    Assessment/Plan: 1. 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  Physiatrist is providing close team  supervision and 24 hour management of active medical problems listed below.  Physiatrist and rehab team continue to assess barriers to discharge/monitor patient progress toward functional and medical goals  Care Tool:  Bathing    Body parts bathed by patient: Right arm, Left arm, Chest, Abdomen, Front perineal area, Buttocks, Right upper leg, Face, Left lower leg, Right lower leg, Left upper leg         Bathing assist Assist Level: Minimal Assistance - Patient > 75%     Upper Body Dressing/Undressing Upper body dressing   What is the patient wearing?: Hospital gown only    Upper body assist Assist Level: Minimal Assistance - Patient > 75%    Lower Body Dressing/Undressing Lower body dressing      What is the patient wearing?: Underwear/pull up     Lower body assist Assist for lower body dressing: Contact Guard/Touching assist     Toileting Toileting    Toileting assist Assist for toileting: Moderate Assistance - Patient 50 - 74%     Transfers Chair/bed transfer  Transfers assist     Chair/bed transfer assist level: Contact Guard/Touching assist     Locomotion Ambulation   Ambulation assist      Assist level: Minimal Assistance - Patient > 75% Assistive device: No Device Max distance: 200 ft   Walk 10 feet activity   Assist     Assist level: Minimal Assistance - Patient > 75% Assistive device: No Device  Walk 50 feet activity   Assist    Assist level: Minimal Assistance - Patient > 75% Assistive device: No Device    Walk 150 feet activity   Assist Walk 150 feet activity did not occur: Safety/medical concerns  Assist level: Minimal Assistance - Patient > 75% Assistive device: No Device    Walk 10 feet on uneven surface  activity   Assist Walk 10 feet on uneven surfaces activity did not occur: Safety/medical concerns         Wheelchair     Assist Will patient use wheelchair at discharge?: No(anticipate pt will be a  functional ambulator)             Wheelchair 50 feet with 2 turns activity    Assist            Wheelchair 150 feet activity     Assist          Blood pressure 129/69, pulse 62, temperature 98.2 F (36.8 C), temperature source Oral, resp. rate 18, height 5\' 7"  (1.702 m), weight 61 kg, SpO2 97 %.  Medical Problem List and Plan: 1.  Increasing left side weakness secondary to minor acute extension along the anterior margin of the old infarct right parietal cortical and subcortical affecting the deep white matter and small focus adjacent cortical brain             Patient is beginning CIR therapies today including PT and OT  2.  Antithrombotics: -DVT/anticoagulation: Chronic Coumadin with INR goal 3-3.5  -INR 3.2 10/31  11/1- INR 2.6- Tx dose lovenox until INR therapueutic             -antiplatelet therapy: Aspirin 81 mg daily 3. Pain Management: Tylenol as needed 4. Mood: Provide emotional support             -antipsychotic agents: N/A 5. Neuropsych: This patient is capable of making decisions on her own behalf. 6. Skin/Wound Care: Routine skin checks 7. Fluids/Electrolytes/Nutrition: Routine in and outs.  8.  Hypertension.  Norvasc 10 mg daily Vitals:   09/09/19 1909 09/10/19 0300  BP: 110/65 129/69  Pulse: 77 62  Resp: 18 18  Temp: 98 F (36.7 C) 98.2 F (36.8 C)  SpO2: 98% 97%   9.  History of mitral valve replacement.  Continue Coumadin 10.  Hyperlipidemia.  Cont Lipitor 11. Prediabetes- Hgb A1C 6.1- diet ed              Monitor with increased mobility 12. Thrombocytopenia             platelets stable to increased 160 10/29    LOS: 5 days A FACE TO FACE EVALUATION WAS PERFORMED  Charlett Blake 09/10/2019, 8:58 AM

## 2019-09-11 ENCOUNTER — Inpatient Hospital Stay (HOSPITAL_COMMUNITY): Payer: Medicare Other | Admitting: Occupational Therapy

## 2019-09-11 ENCOUNTER — Inpatient Hospital Stay (HOSPITAL_COMMUNITY): Payer: Medicare Other | Admitting: Physical Therapy

## 2019-09-11 LAB — PROTIME-INR
INR: 3 — ABNORMAL HIGH (ref 0.8–1.2)
Prothrombin Time: 30.3 seconds — ABNORMAL HIGH (ref 11.4–15.2)

## 2019-09-11 MED ORDER — WARFARIN SODIUM 7.5 MG PO TABS
7.5000 mg | ORAL_TABLET | Freq: Once | ORAL | Status: AC
  Administered 2019-09-11: 7.5 mg via ORAL
  Filled 2019-09-11: qty 1

## 2019-09-11 NOTE — Progress Notes (Signed)
. [facility] PHYSICAL MEDICINE & REHABILITATION PROGRESS NOTE   Subjective/Complaints:  Seen in PT, some dizziness with side stepping, tendency to fall backward, not lightheaded, eating ok per pt  ROS: Patient denies  blurred vision, nausea, vomiting, diarrhea, cough, shortness of breath or chest pain,    Objective:   No results found. Recent Labs    09/09/19 0446  WBC 3.5*  HGB 13.0  HCT 39.7  PLT 166   No results for input(s): NA, K, CL, CO2, GLUCOSE, BUN, CREATININE, CALCIUM in the last 72 hours.  Intake/Output Summary (Last 24 hours) at 09/11/2019 0934 Last data filed at 09/11/2019 0733 Gross per 24 hour  Intake 820 ml  Output -  Net 820 ml     Physical Exam: Vital Signs Blood pressure 126/68, pulse (!) 58, temperature 98.1 F (36.7 C), resp. rate 18, height 5\' 7"  (1.702 m), weight 61 kg, SpO2 97 %.  Constitutional: No distress . Vital signs reviewed. Pt sitting on mat in gym, NAD HEENT: EOMI, oral membranes moist Neck: supple Cardiovascular: RRR without murmur. No JVD    Respiratory: CTA Bilaterally without wheezes or rales. Normal effort    GI: BS +, non-tender, non-distended  Musculoskeletal:     Comments: No edema or tenderness in extremities ; TTP over L trochanteric bursa Neurological: She is alert and oriented x 3    Good awareness of deficits. Motor: RUE: 4+/5 proximal to distal LUE: 3+ prox to 4/5 distally. Mild left PD LLE: HF, KE 3- to 3/5, ADF limited due to AFO  Skin: Skin is warm and dry. Bruises on UEs B/L from blood draws/IVs Psychiatric: pleasant, engaged in therapy.    Assessment/Plan: 1. 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  Physiatrist is providing close team supervision and 24 hour management of active medical problems listed below.  Physiatrist and rehab team continue to assess barriers to discharge/monitor patient progress  toward functional and medical goals  Care Tool:  Bathing    Body parts bathed by patient: Right arm, Left arm, Chest, Abdomen, Front perineal area, Buttocks, Right upper leg, Face, Left lower leg, Right lower leg, Left upper leg         Bathing assist Assist Level: Minimal Assistance - Patient > 75%     Upper Body Dressing/Undressing Upper body dressing   What is the patient wearing?: Hospital gown only    Upper body assist Assist Level: Minimal Assistance - Patient > 75%    Lower Body Dressing/Undressing Lower body dressing      What is the patient wearing?: Underwear/pull up     Lower body assist Assist for lower body dressing: Contact Guard/Touching assist     Toileting Toileting    Toileting assist Assist for toileting: Moderate Assistance - Patient 50 - 74%     Transfers Chair/bed transfer  Transfers assist     Chair/bed transfer assist level: Minimal Assistance - Patient > 75%     Locomotion Ambulation   Ambulation assist      Assist level: Minimal Assistance - Patient > 75% Assistive device: No Device Max distance: 200 ft   Walk 10 feet activity   Assist     Assist level: Minimal Assistance - Patient > 75% Assistive device: No Device   Walk 50 feet activity   Assist    Assist level: Minimal Assistance - Patient > 75% Assistive device: No Device    Walk  150 feet activity   Assist Walk 150 feet activity did not occur: Safety/medical concerns  Assist level: Minimal Assistance - Patient > 75% Assistive device: No Device    Walk 10 feet on uneven surface  activity   Assist Walk 10 feet on uneven surfaces activity did not occur: Safety/medical concerns         Wheelchair     Assist Will patient use wheelchair at discharge?: No(anticipate pt will be a functional ambulator)             Wheelchair 50 feet with 2 turns activity    Assist            Wheelchair 150 feet activity     Assist           Blood pressure 126/68, pulse (!) 58, temperature 98.1 F (36.7 C), resp. rate 18, height 5\' 7"  (1.702 m), weight 61 kg, SpO2 97 %.  Medical Problem List and Plan: 1.  Increasing left side weakness secondary to minor acute extension along the anterior margin of the old infarct right parietal cortical and subcortical affecting the deep white matter and small focus adjacent cortical brain     Team conf in am 2.  Antithrombotics: -DVT/anticoagulation: Chronic Coumadin with INR goal 3-3.5  -INR 3.2 10/31  11/2- INR 3.0 D/C enoxaparin             -antiplatelet therapy: Aspirin 81 mg daily 3. Pain Management: Tylenol as needed  4. Mood: Provide emotional support             -antipsychotic agents: N/A 5. Neuropsych: This patient is capable of making decisions on her own behalf. 6. Skin/Wound Care: Routine skin checks 7. Fluids/Electrolytes/Nutrition: Routine in and outs.  8.  Hypertension.  Norvasc 10 mg daily Vitals:   09/10/19 2152 09/11/19 0324  BP: 106/69 126/68  Pulse: 82 (!) 58  Resp: 16 18  Temp: 98 F (36.7 C) 98.1 F (36.7 C)  SpO2: 97% 97%   9.  History of mitral valve replacement.  Continue Coumadin 10.  Hyperlipidemia.  Cont Lipitor 11. Prediabetes- Hgb A1C 6.1- diet ed              Monitor with increased mobility 12. Thrombocytopenia             platelets stable to increased 160 10/29    LOS: 6 days A FACE TO [facility] E Pamalee Marcoe 09/11/2019, 9:34 AM

## 2019-09-11 NOTE — Progress Notes (Signed)
Physical Therapy Session Note  Massey Details  Name: [name] MRN: [id number] Date of Birth: 02/22/1946  Today's Date: 09/11/2019 PT Individual Time: 0804-0908 PT Individual Time Calculation (min): 64 min   Short Term Goals: Week 1:  PT Short Term Goal 1 (Week 1): Pt will perform supine<>sit with supervision PT Short Term Goal 2 (Week 1): Pt will perform sit<>stand transfers using LRAD with CGA PT Short Term Goal 3 (Week 1): Pt will perform stand pivot transfers using LRAD with CGA PT Short Term Goal 4 (Week 1): Pt will ambulate at least 123ft using LRAD with CGA PT Short Term Goal 5 (Week 1): Pt will step up/down 4" curb step using LRAD with min assist  Skilled Therapeutic Interventions/Progress Updates:    Pt received supine in bed and agreeable to therapy session. Supine>sit with supervision. Sitting EOB with supervision for safety performed UB and LB dressing with increased time and set-up assist. Sit<>stand EOB<>no AD with CGA for steadying and pt demonstrating increased L LE weight shift. Ambulated ~230ft to main therapy gym, no AD, with CGA/min assist for steadying/balance and cuing with manual facilitation for improved L LE hip/knee flexion during swing, increased pelvic rotation, and increased L UE arm swing. Used agility ladder to target L LE NMR of increased hip/knee flexion during swing as well as increased step length - ambulated ~44ft extra on the end of the ladder to promote carryover of the gait pattern when walking without the ladder - CGA/min assist for steadying/balance. Used agility ladder targeting L hip abductor strengthening, dynamic standing balance, and L LE coordination to side step with CGA and intermittent min assist for steadying/balance - reports this task makes her head feel "swimmy". MD in out for morning assessment. Sitting progressed to standing ball toss into rebounder focusing on B UE NMR/coordination and standing on airex with pt having posterior lean/LOB.  Transitioned to hip strategy retraining to recover from posterior lean with pt demonstrating significant mtor planning impairments with this task requiring min assist to bring her weight forward off the wall. Ambulated ~248ft back to room, no AD, with continued cuing as described above for L LE gait mechanics. Pt left seated in w/c with needs in reach and chair alarm on.   Therapy Documentation Precautions:  Precautions Precautions: Fall Restrictions Weight Bearing Restrictions: No  Pain:   Denies pain during session.   Therapy/Group: Individual Therapy  Tawana Scale, PT, DPT 09/11/2019, 7:49 AM

## 2019-09-11 NOTE — Plan of Care (Signed)
Nutrition Education Note  RD consulted for nutrition education regarding a Heart Healthy diet and pre-diabetes.   Lipid Panel     Component Value Date/Time   CHOL 125 09/04/2019 0359   TRIG 29 09/04/2019 0359   HDL 54 09/04/2019 0359   CHOLHDL 2.3 09/04/2019 0359   VLDL 6 09/04/2019 0359   LDLCALC 65 09/04/2019 0359    RD provided "Heart Healthy Consistent Carbohydrates Nutrition Therapy" handout from the Academy of [employer]. Reviewed patient's dietary recall. Provided examples on ways to decrease sodium and fat intake in diet. Discouraged intake of processed foods and use of salt shaker. Encouraged fresh fruits and vegetables as well as whole grain sources of carbohydrates to maximize fiber intake. Discussed foods containing carbohydrates and recommended serving sizes.Teach back method used.  Expect good compliance.  Body mass index is 21.06 kg/m. Pt meets criteria for normal based on current BMI.  Current diet order is heart healthy, patient is consuming approximately 100% of meals at this time. Labs and medications reviewed. No further nutrition interventions warranted at this time. RD contact information provided. If additional nutrition issues arise, please re-consult RD.  Corrin Parker, MS, RD, LDN Pager # [phone] After hours/ weekend pager # [phone]

## 2019-09-11 NOTE — Progress Notes (Signed)
Occupational Therapy Session Note  Patient Details  Name: [name] MRN: [id number] Date of Birth: May 05, 1946  Today's Date: 09/11/2019 OT Individual Time: 1349-1500 OT Individual Time Calculation (min): 71 min    Short Term Goals: Week 1:  OT Short Term Goal 1 (Week 1): Pt will complete LB bathing with min guard assist. OT Short Term Goal 2 (Week 1): Pt will complete LB dressing with min guard assist. OT Short Term Goal 3 (Week 1): Pt will complete toilet transfer with min guard assist stand pivot OT Short Term Goal 4 (Week 1): Pt will complete FM coordination tasks for the LUE with modified independence following handout.  Skilled Therapeutic Interventions/Progress Updates:    Pt completed bathing and dressing sit to stand at the sink during session.  She was able to remove her top and her bra as well as complete UB bathing with supervision.  She was able to complete doffing her socks, shoes, and TEDs with min assist in order to complete LB bathing of her feet and lower legs.  She declined completion of washing her buttocks or peri area as they were completed earlier today.  She donned her TEDs with mod assist from therapist on the left side.  Supervision for completion of socks, shoes, and AFO.  Finished session with functional mobility up the hallway and back with min facilitation and focus on keeping the left elbow extended, head up, and shoulders back.  Pt left in the wheelchair to conclude session with call button and phone in reach and safety belt in place.     Therapy Documentation Precautions:  Precautions Precautions: Fall Restrictions Weight Bearing Restrictions: No  Pain: Pain Assessment Pain Scale: Faces Pain Score: 0-No pain ADL: See Care Tool Section for some details of mobility and selfcare  Therapy/Group: Individual Therapy  Percell Lamboy OTR/L 09/11/2019, 3:52 PM

## 2019-09-11 NOTE — Plan of Care (Signed)
  Problem: Consults Goal: RH STROKE PATIENT EDUCATION Description: See Patient Education module for education specifics  Outcome: Progressing Goal: Nutrition Consult-if indicated Outcome: Progressing   Problem: RH BOWEL ELIMINATION Goal: RH STG MANAGE BOWEL WITH ASSISTANCE Description: STG Manage Bowel with supervision Assistance. Outcome: Progressing Goal: RH STG MANAGE BOWEL W/MEDICATION W/ASSISTANCE Description: STG Manage Bowel with Medication with supervision Assistance. Outcome: Progressing   Problem: RH BLADDER ELIMINATION Goal: RH STG MANAGE BLADDER WITH ASSISTANCE Description: STG Manage Bladder With supervision Assistance Outcome: Progressing   Problem: RH SKIN INTEGRITY Goal: RH STG SKIN FREE OF INFECTION/BREAKDOWN Description: Skin to remain free from breakdown while on rehab with min assist. Outcome: Progressing Goal: RH STG MAINTAIN SKIN INTEGRITY WITH ASSISTANCE Description: STG Maintain Skin Integrity With min Assistance. Outcome: Progressing Goal: RH STG ABLE TO PERFORM INCISION/WOUND CARE W/ASSISTANCE Description: STG Able To Perform Incision/Wound Care With min Assistance. Outcome: Progressing   Problem: RH SAFETY Goal: RH STG ADHERE TO SAFETY PRECAUTIONS W/ASSISTANCE/DEVICE Description: STG Adhere to Safety Precautions With supervision Assistance and appropriate assistive Device. Outcome: Progressing   Problem: RH PAIN MANAGEMENT Goal: RH STG PAIN MANAGED AT OR BELOW PT'S PAIN GOAL Description: <3 on a 0-10 pain scale. Outcome: Progressing   Problem: RH KNOWLEDGE DEFICIT Goal: RH STG INCREASE KNOWLEDGE OF HYPERTENSION Description: Patient will be able to demonstrate knowledge of HTN, medications, BP parameters, and dietary restrictions with min assist from rehab staff. Outcome: Progressing Goal: RH STG INCREASE KNOWLEGDE OF HYPERLIPIDEMIA Description: Patient will demonstrate knowledge of HLD medications and dietary restrictions with min assist from  rehab staff. Outcome: Progressing Goal: RH STG INCREASE KNOWLEDGE OF STROKE PROPHYLAXIS Description: Patient will demonstrate knowledge of stroke prevention medications with min assist from rehab staff. Outcome: Progressing

## 2019-09-11 NOTE — Progress Notes (Signed)
Occupational Therapy Session Note  Patient Details  Name: [name] MRN: [id number] Date of Birth: 22-Oct-1946  Today's Date: 09/11/2019 OT Individual Time: 1005-1106 OT Individual Time Calculation (min): 61 min    Short Term Goals: Week 1:  OT Short Term Goal 1 (Week 1): Pt will complete LB bathing with min guard assist. OT Short Term Goal 2 (Week 1): Pt will complete LB dressing with min guard assist. OT Short Term Goal 3 (Week 1): Pt will complete toilet transfer with min guard assist stand pivot OT Short Term Goal 4 (Week 1): Pt will complete FM coordination tasks for the LUE with modified independence following handout.  Skilled Therapeutic Interventions/Progress Updates:    Pt completed functional mobility down to the therapy gym with min assist and no assistive device.  Noted increased flexor tone in the left elbow with mobility as well as compensatory pattern in trunk and LLE with stepping.  Once in the gym, she transferred to the therapy gym to supine position.  Therapist performed left scapular mobilizations in sidelying and then had her transition to supine where therapist performed gentle stretching to the left pectoral and shoulder extensors with emphasis on shoulder flexion and external rotation.  Had her work on transitions from the left side to sitting with activation of the left elbow extensors on the mat with the palm of the hand flat as well.  Incorporated reciprical scooting with use of the LUE to help with scooting the left hip forward and backward with min to mod assist.  Instructed pt to start using the LUE more to push up from surfaces with sit to stand transitions.  Returned to the room with min assist ambulating without use of an assistive device.  Finished session with pt sitting in the wheelchair with call button and phone in reach and safety alarm pad in place.  Pt's spouse present at end of session as well.   Therapy Documentation Precautions:   Precautions Precautions: Fall Restrictions Weight Bearing Restrictions: No  Pain: Pain Assessment Pain Scale: Faces Pain Score: 0-No pain ADL: See Care Tool Section for some details of ADL tasks  Therapy/Group: Individual Therapy  Dakia Schifano OTR/L 09/11/2019, 12:27 PM

## 2019-09-11 NOTE — Progress Notes (Signed)
ANTICOAGULATION CONSULT NOTE   Pharmacy Consult for Warfarin  Indication: mechanical MVR   Patient Measurements: Height: 5\' 7"  (170.2 cm) Weight: 134 lb 7.7 oz (61 kg) IBW/kg (Calculated) : 61.6  Vital Signs: Temp: 98.1 F (36.7 C) (11/03 0324) BP: 126/68 (11/03 0324) Pulse Rate: 58 (11/03 0324)  Labs: Recent Labs    09/09/19 0446 09/10/19 0510 09/11/19 0444  HGB 13.0  --   --   HCT 39.7  --   --   PLT 166  --   --   LABPROT 27.5* 29.0* 30.3*  INR 2.6* 2.8* 3.0*    Estimated Creatinine Clearance: 60.3 mL/min (by C-G formula based on SCr of 0.67 mg/dL).  Assessment: 73 year old female presented to the ED with worsening left sided weakness. She is on chronic warfarin for history of a mechanical valve.   INR therapeutic today at 3  PTA warfarin dose: 7.5 mg q Mon/Fri, 5 mg on all other days  Goal of Therapy:  INR 3-3.5 Monitor platelets by anticoagulation protocol: Yes   Plan:  Warfarin 7.5 mg again tonight Daily INR DC Lovenox   Thank you Anette Guarneri, PharmD  Please utilize [facility] for appropriate phone number to reach the unit pharmacist ([place])   09/11/2019 8:17 AM

## 2019-09-12 ENCOUNTER — Inpatient Hospital Stay (HOSPITAL_COMMUNITY): Payer: Medicare Other | Admitting: Occupational Therapy

## 2019-09-12 ENCOUNTER — Inpatient Hospital Stay (HOSPITAL_COMMUNITY): Payer: Medicare Other | Admitting: Physical Therapy

## 2019-09-12 ENCOUNTER — Inpatient Hospital Stay (HOSPITAL_COMMUNITY): Payer: Medicare Other

## 2019-09-12 LAB — CBC
HCT: 37.6 % (ref 36.0–46.0)
Hemoglobin: 12.1 g/dL (ref 12.0–15.0)
MCH: 29.2 pg (ref 26.0–34.0)
MCHC: 32.2 g/dL (ref 30.0–36.0)
MCV: 90.8 fL (ref 80.0–100.0)
Platelets: 173 10*3/uL (ref 150–400)
RBC: 4.14 MIL/uL (ref 3.87–5.11)
RDW: 14.3 % (ref 11.5–15.5)
WBC: 2.9 10*3/uL — ABNORMAL LOW (ref 4.0–10.5)
nRBC: 0 % (ref 0.0–0.2)

## 2019-09-12 LAB — PROTIME-INR
INR: 3.2 — ABNORMAL HIGH (ref 0.8–1.2)
Prothrombin Time: 32.5 seconds — ABNORMAL HIGH (ref 11.4–15.2)

## 2019-09-12 MED ORDER — WARFARIN SODIUM 7.5 MG PO TABS
7.5000 mg | ORAL_TABLET | Freq: Once | ORAL | Status: AC
  Administered 2019-09-12: 7.5 mg via ORAL
  Filled 2019-09-12: qty 1

## 2019-09-12 NOTE — Patient Care Conference (Signed)
Inpatient RehabilitationTeam Conference and Plan of Care Update Date: 09/12/2019   Time: 10:30 AM   Patient Name: [name]      Medical Record Number: [id number]  Date of Birth: 03-26-1946 Sex: Female         Room/Bed: 4W26C/4W26C-01 Payor Info: Payor: MEDICARE / Plan: MEDICARE PART A AND B / Product Type: *No Product type* /    Admit Date/Time:  09/05/2019  4:12 PM  Primary Diagnosis:  CVA (cerebral vascular accident) [facility])  Patient Active Problem List   Diagnosis Date Noted  . 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 Anticoagulated on warfarin   . Left hemiparesis ([place])   . History of CVA with residual deficit   . Thrombocytopenia ([place])   . Prediabetes   . CVA (cerebral vascular accident) ([place]) 09/03/2019  . Vertigo 09/24/2017  . Benign paroxysmal positional vertigo   . History of stroke   . Hyperlipemia 08/23/2017  . Encounter for therapeutic drug monitoring 08/08/2017  . Long term (current) use of anticoagulants [Z79.01] 07/12/2017  . Hemiparesis and alteration of sensations as late effects of stroke ([place]) 09/07/2016  . Abnormality of gait 09/07/2016  . HTN (hypertension) 04/13/2016  . H/O mitral valve replacement with mechanical valve 10/16/2015  . Diabetes mellitus without complication ([place])   . Hemiparesis (L sided - mild) due to old stroke [facility])     Expected Discharge Date: Expected Discharge Date: 09/15/19  Team Members Present: Physician leading conference: Dr. Alysia Penna Social Worker Present: Ovidio Kin, LCSW Nurse Present: Isla Pence, RN Case Manager: Karene Fry, RN PT Present: Michaelene Song, PT OT Present: Willeen Cass, OT SLP Present: Charolett Bumpers, SLP PPS Coordinator present : Gunnar Fusi, SLP     Current Status/Progress Goal Weekly Team Focus  Bowel/Bladder   pt is continent of b/b, lbm11/3  Remain continetn of b/b  Q2h toileting/ PRN   Swallow/Nutrition/ Hydration             ADL's   supervision for UB bathing with  min guard for LB bathing.  Min assist for LB dressing with supervision for UB dressing.  Min assist for transfers without assistive device  supervision  selfcare retraining, balance retraining, transfer training,  DME education, NMES, pt/family education   Mobility   supervision bed mobility, CGA/min assist transfers without AD, min assist gait 262ft without AD  mod-I bed mobility, supervision transfers and gait to 146ft using LRAD, CGA 1 small step for home entry using LRAD  L LE neuromuscular re-education, LE strengthening, gait training, dynamic standing balance, pt education, activity tolerance   Communication             Safety/Cognition/ Behavioral Observations            Pain   pt denies pain at this time  pt will remain pain free  Assess pain qshift/prn   Skin   pt shows no signs of skin breakdown  Pt will remain free of skin breakdown and infection  Assess skin qshift/prn      *See Care Plan and progress notes for long and short-term goals.     Barriers to Discharge  Current Status/Progress Possible Resolutions Date Resolved   Nursing                  PT                    OT  SLP                SW                Discharge Planning/Teaching Needs:  Home with husband who is here daily and observed in therpaies, he has provided assist with past CVA's. Pursuing OP therapy follow up      Team Discussion: Previous CVA with L side weakness, small extension, lost some function, relearning compensatory strategies, going over DM diet.  A+O, cont B/B, uses walker, left leg brace, off lovenox.  OT S UB B/D, toilet min A with L AFO, goals S, doing well.  PT higher level balance work, CGA/S walker, cane CGA/min A 200'   Revisions to Treatment Plan: N/A     Medical Summary Current Status: pt not aware of being diabetic, receptive to diabetic  education for diet. Weekly Focus/Goal: Maximize LUE strength and coordination     Possible Resolutions to Barriers:  work on new strategies for mobility   Continued Need for Acute Rehabilitation Level of Care: The patient requires daily medical management by a physician with specialized training in physical medicine and rehabilitation for the following reasons: Direction of a multidisciplinary physical rehabilitation program to maximize functional independence : Yes Medical management of patient stability for increased activity during participation in an intensive rehabilitation regime.: Yes Analysis of laboratory values and/or radiology reports with any subsequent need for medication adjustment and/or medical intervention. : Yes   I attest that I was present, lead the team conference, and concur with the assessment and plan of the team.   Retta Diones 09/12/2019, 2:56 PM  Team conference was held via web/ teleconference due to [place] - 19

## 2019-09-12 NOTE — Progress Notes (Signed)
. [facility] PHYSICAL MEDICINE & REHABILITATION PROGRESS NOTE   Subjective/Complaints:  Working on bed mobility with PT, no dizziness sit to stand, discussed anticoag  ROS: Patient denies  blurred vision, nausea, vomiting, diarrhea, cough, shortness of breath or chest pain,    Objective:   No results found. Recent Labs    09/12/19 0502  WBC 2.9*  HGB 12.1  HCT 37.6  PLT 173   No results for input(s): NA, K, CL, CO2, GLUCOSE, BUN, CREATININE, CALCIUM in the last 72 hours.  Intake/Output Summary (Last 24 hours) at 09/12/2019 0929 Last data filed at 09/12/2019 0751 Gross per 24 hour  Intake 780 ml  Output -  Net 780 ml     Physical Exam: Vital Signs Blood pressure 123/64, pulse 68, temperature (!) 97.3 F (36.3 C), resp. rate 17, height 5' 7" (1.702 m), weight 61 kg, SpO2 97 %.  Constitutional: No distress . Vital signs reviewed. Pt sitting on mat in gym, NAD HEENT: EOMI, oral membranes moist Neck: supple Cardiovascular: RRR without murmur. No JVD    Respiratory: CTA Bilaterally without wheezes or rales. Normal effort    GI: BS +, non-tender, non-distended  Musculoskeletal:     Comments: No edema or tenderness in extremities ; TTP over L trochanteric bursa Neurological: She is alert and oriented x 3    Good awareness of deficits. Motor: RUE: 4+/5 proximal to distal LUE: 3+ prox to 4/5 distally. Mild left PD LLE: HF, KE 3- to 3/5, ADF limited due to AFO  Skin: Skin is warm and dry. Bruises on UEs B/L from blood draws/IVs Psychiatric: pleasant, engaged in therapy.    Assessment/Plan: 1. 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  Physiatrist is providing close team supervision and 24 hour management of active medical problems listed below.  Physiatrist and rehab team continue to assess barriers to discharge/monitor patient progress toward functional and medical  goals  Care Tool:  Bathing    Body parts bathed by patient: Right arm, Left arm, Chest, Abdomen, Right upper leg, Face, Left lower leg, Right lower leg, Left upper leg         Bathing assist Assist Level: Supervision/Verbal cueing     Upper Body Dressing/Undressing Upper body dressing   What is the patient wearing?: Bra, Pull over shirt    Upper body assist Assist Level: Supervision/Verbal cueing    Lower Body Dressing/Undressing Lower body dressing      What is the patient wearing?: Underwear/pull up     Lower body assist Assist for lower body dressing: Contact Guard/Touching assist     Toileting Toileting    Toileting assist Assist for toileting: Moderate Assistance - Patient 50 - 74%     Transfers Chair/bed transfer  Transfers assist     Chair/bed transfer assist level: Minimal Assistance - Patient > 75%     Locomotion Ambulation   Ambulation assist      Assist level: Minimal Assistance - Patient > 75% Assistive device: No Device Max distance: 200 ft   Walk 10 feet activity   Assist     Assist level: Minimal Assistance - Patient > 75% Assistive device: No Device   Walk 50 feet activity   Assist    Assist level: Minimal Assistance - Patient > 75% Assistive device: No Device    Walk 150 feet activity   Assist Walk 150 feet activity did not occur: Safety/medical concerns  Assist level: Minimal Assistance - Patient > 75% Assistive device: No Device    Walk 10 feet on uneven surface  activity   Assist Walk 10 feet on uneven surfaces activity did not occur: Safety/medical concerns         Wheelchair     Assist Will patient use wheelchair at discharge?: No(anticipate pt will be a functional ambulator)             Wheelchair 50 feet with 2 turns activity    Assist            Wheelchair 150 feet activity     Assist          Blood pressure 123/64, pulse 68, temperature (!) 97.3 F (36.3 C),  resp. rate 17, height 5' 7" (1.702 m), weight 61 kg, SpO2 97 %.  Medical Problem List and Plan: 1.  Increasing left side weakness secondary to minor acute extension along the anterior margin of the old infarct right parietal cortical and subcortical affecting the deep white matter and small focus adjacent cortical brain Team conference today please see physician documentation under team conference tab, met with team face-to-face to discuss problems,progress, and goals. Formulized individual treatment plan based on medical history, underlying problem and comorbidities. 2.  Antithrombotics: -DVT/anticoagulation: Chronic Coumadin for MVR with INR goal 3-3.5  -INR 3.2 10/31  11/2- INR 3.0 D/C enoxaparin             -antiplatelet therapy: Aspirin 81 mg daily 3. Pain Management: Tylenol as needed  4. Mood: Provide emotional support             -antipsychotic agents: N/A 5. Neuropsych: This patient is capable of making decisions on her own behalf. 6. Skin/Wound Care: Routine skin checks 7. Fluids/Electrolytes/Nutrition: Routine in and outs.  8.  Hypertension.  Norvasc 10 mg daily Vitals:   09/11/19 1946 09/12/19 0311  BP: 120/76 123/64  Pulse: 87 68  Resp: 16 17  Temp: (!) 97.3 F (36.3 C)   SpO2: 100% 97%   9.  History of mitral valve replacement.  Continue Coumadin 10.  Hyperlipidemia.  Cont Lipitor 11. Prediabetes- Hgb A1C 6.1- diet ed              Monitor with increased mobility 12. Thrombocytopenia resolved              platelets stable, now off enoxaparin    LOS: 7 days A FACE TO FACE EVALUATION WAS PERFORMED  Charlett Blake 09/12/2019, 9:29 AM

## 2019-09-12 NOTE — Progress Notes (Signed)
Social Work Patient ID: [name], female   DOB: [birth year], 72 y.o.   MRN: [id number]  Met with pt and husband to discuss team conference goals supervision level and target discharge 11/7. Both are pleased with how well she is doing and husband has been here to see her in therapies. Feel ready to go home Sat.

## 2019-09-12 NOTE — Progress Notes (Signed)
Physical Therapy Session Note  Patient Details  Name: [name] MRN: [id number] Date of Birth: [birth year]  Today's Date: 09/12/2019 PT Individual Time: 1030-1130 PT Individual Time Calculation (min): 60 min   Short Term Goals: Week 1:  PT Short Term Goal 1 (Week 1): Pt will perform supine<>sit with supervision PT Short Term Goal 2 (Week 1): Pt will perform sit<>stand transfers using LRAD with CGA PT Short Term Goal 3 (Week 1): Pt will perform stand pivot transfers using LRAD with CGA PT Short Term Goal 4 (Week 1): Pt will ambulate at least 112f using LRAD with CGA PT Short Term Goal 5 (Week 1): Pt will step up/down 4" curb step using LRAD with min assist  Skilled Therapeutic Interventions/Progress Updates:   Pt received sitting in WC and agreeable to PT. PT assist pt to don Shoes and L AFO.   Nueromuscular re-ed through trinaing with RW to rehab gym with supervision assist x 208f Pt noted to have ataxia on the LLE with inconsistent step length and width, improved with cues from PT. Dynamic gait training to improve step length and coordination in the LLE without AD: Weave through cones 6 x 4 with CGA-supervision assist and min cues for safety in turns. Stepping over canes 6 x 2 with step-to gait pattern R foot leading first bout and L foot leading the second. Pt then performed step through pattern 6 x 1 with CGA.  Cues for improved activation of L paraspinal and parascapular musculature to improve trunk control as well as decreased force of heel contact on the R to improve L gluteal activation. Additional gait training without AD x [id number] supervision A - CGA. Improved coordination on the step width and length L compared to gait with RW.  Pt declined to attempt floor transfer, as stating that she gets nauseous when WB through knees. Patient returned to room and [facility] in reach and all needs met.         Therapy Documentation Precautions:   Precautions Precautions: Fall Restrictions Weight Bearing Restrictions: No   Pain: Pain Assessment Pain Scale: 0-10 Pain Score: 0-No pain(pre medicated prior to therapy)    Therapy/Group: Individual Therapy  AustLorie Phenix4/2020, 11:36 AM

## 2019-09-12 NOTE — Progress Notes (Signed)
ANTICOAGULATION CONSULT NOTE   Pharmacy Consult for Warfarin  Indication: mechanical MVR   Patient Measurements: Height: 5\' 7"  (170.2 cm) Weight: 134 lb 7.7 oz (61 kg) IBW/kg (Calculated) : 61.6  Vital Signs: BP: 123/64 (11/04 0311) Pulse Rate: 68 (11/04 0311)  Labs: Recent Labs    09/10/19 0510 09/11/19 0444 09/12/19 0502  HGB  --   --  12.1  HCT  --   --  37.6  PLT  --   --  173  LABPROT 29.0* 30.3* 32.5*  INR 2.8* 3.0* 3.2*    Estimated Creatinine Clearance: 60.3 mL/min (by C-G formula based on SCr of 0.67 mg/dL).  Assessment: 73 year old female presented to the ED with worsening left sided weakness. She is on chronic warfarin for history of a mechanical valve.   INR therapeutic today at 3.2  PTA warfarin dose: 7.5 mg q Mon/Fri, 5 mg on all other days  Goal of Therapy:  INR 3-3.5 Monitor platelets by anticoagulation protocol: Yes   Plan:  Warfarin 7.5 mg again tonight (likely decrease dose 11/5) Daily INR  Thank you Anette Guarneri, PharmD  Please utilize Amion for appropriate phone number to reach the unit pharmacist ([place])   09/12/2019 8:00 AM

## 2019-09-12 NOTE — Progress Notes (Signed)
Physical Therapy Session Note  Patient Details  Name: [name] MRN: [id number] Date of Birth: 1946/08/14  Today's Date: 09/12/2019 PT Individual Time: 0910-1008 PT Individual Time Calculation (min): 58 min   Short Term Goals: Week 1:  PT Short Term Goal 1 (Week 1): Pt will perform supine<>sit with supervision PT Short Term Goal 2 (Week 1): Pt will perform sit<>stand transfers using LRAD with CGA PT Short Term Goal 3 (Week 1): Pt will perform stand pivot transfers using LRAD with CGA PT Short Term Goal 4 (Week 1): Pt will ambulate at least 156ft using LRAD with CGA PT Short Term Goal 5 (Week 1): Pt will step up/down 4" curb step using LRAD with min assist      Skilled Therapeutic Interventions/Progress Updates:   Pt resting in bed.  She asked if she could get dressed before leaving the room.  PT donned TEDS.  Bed mobility training for rolling L and R, blocked practice with improvement in core activation, and upper trunk activation.  Pt over-activates cervical flexion for movement in bed.  Pt donned pants in supine with min assist.  In sitting, she donned bra with min assist and shirt with supervision.  Sit> stand with CGA.  In standing, pt leans L and is unable to fully straighten. Pt has scoliosis with R convexity.  neuromuscular re-education via multimodal cues, for 5 x 1 mini squats. Sustained stretch bil heel cords and hamstrings, and balance challenge, standing with forefeet on wedge, bil UE support> 1 UE support.  Pt has immediate LOB backwards and no balance strategies noted when UE support removed.  With practice and multimodal cues, pt elicited minimal hip strategy.  At end of session, pt in w/c with seat pad alarm set, husband present, and needs at hand.     Therapy Documentation Precautions:  Precautions Precautions: Fall Restrictions Weight Bearing Restrictions: No  Pain: Pain Assessment Pain Scale: 0-10 Pain Score: 0-No pain(pre medicated prior to therapy)       Therapy/Group: Individual Therapy  Wagner Tanzi 09/12/2019, 10:14 AM

## 2019-09-12 NOTE — Progress Notes (Signed)
Occupational Therapy Session Note  Patient Details  Name: [name] MRN: [id number] Date of Birth: 1946/04/28  Today's Date: 09/12/2019 OT Individual Time: 1415-1535 OT Individual Time Calculation (min): 80 min    Short Term Goals: Week 1:  OT Short Term Goal 1 (Week 1): Pt will complete LB bathing with min guard assist. OT Short Term Goal 2 (Week 1): Pt will complete LB dressing with min guard assist. OT Short Term Goal 3 (Week 1): Pt will complete toilet transfer with min guard assist stand pivot OT Short Term Goal 4 (Week 1): Pt will complete FM coordination tasks for the LUE with modified independence following handout.  Skilled Therapeutic Interventions/Progress Updates:    Pt completed functional mobility down to the orho gym with min facilitation and mod instructional cueing to maintain upright posture with head at neutral and not flexed.  Once in the gym had her sit on the mat and work on Financial planner with use of the [employer].  She was able to converge at intervals of 18 inches to approximately 4 ft with occasional jerkiness noted in the eyes when attempting to move from the farthest bead to the closest bead.  She was also able to track a moving pendulum side to side with occasional jerkiness in the left eye noted.  When following the pendulum from near to far, she was able to follow consistently.  Next, progressed to neuromuscular re-education for the LLE and LUE.  Attempted quadriped but pt exhibited increased fear once in the position and would not weightshift over to the left knee or LUE in the position.  Because of this she reported increased pain and fatigue in the right hip and arm, resulting in her not wanting to try and maintain the position.  Transitioned to sitting where she worked on functional reach with sit to squat position to place checkers with the RUE in the [place] while weightbearing through the LUE and LLE.  Min assist for sit to squat position  and to maintain.  She did place a couple of checkers with the LUE in this position but needed mod assist to complete, with increased tone in the left elbow flexors noted.  Finished session with return back to the room and pt left sitting up in the wheelchair.  Instructed pt to work on scapular adduction in sitting as well as cervical retraction to assist with posture in standing and during mobility.    Therapy Documentation Precautions:  Precautions Precautions: Fall Restrictions Weight Bearing Restrictions: No  Pain: Pain Assessment Pain Scale: Faces Pain Score: 0-No pain ADL: See Care Tool for details of mobility  Therapy/Group: Individual Therapy  Elhadji Pecore OTR/L 09/12/2019, 4:29 PM

## 2019-09-13 ENCOUNTER — Inpatient Hospital Stay (HOSPITAL_COMMUNITY): Payer: Medicare Other | Admitting: Occupational Therapy

## 2019-09-13 ENCOUNTER — Inpatient Hospital Stay (HOSPITAL_COMMUNITY): Payer: Medicare Other | Admitting: Physical Therapy

## 2019-09-13 LAB — PROTIME-INR
INR: 3.3 — ABNORMAL HIGH (ref 0.8–1.2)
Prothrombin Time: 32.9 seconds — ABNORMAL HIGH (ref 11.4–15.2)

## 2019-09-13 MED ORDER — WARFARIN SODIUM 7.5 MG PO TABS
7.5000 mg | ORAL_TABLET | Freq: Every day | ORAL | Status: DC
  Administered 2019-09-13: 7.5 mg via ORAL
  Filled 2019-09-13: qty 1

## 2019-09-13 NOTE — Progress Notes (Signed)
Social Work Discharge Note   The overall goal for the admission was met for: DC SAT 11/7  Discharge location: Yes-HOME WITH HUSBAND WHO CAN PROVIDE 24 HR SUPERVISION  Length of Stay: Yes-10 DAYS  Discharge activity level: Yes-SUPERVISION LEVEL  Home/community participation: Yes  Services provided included: MD, RD, PT, OT, SLP, RN, CM, Pharmacy, Neuropsych and SW  Financial Services: Medicare and Private Insurance: [place]  Follow-up services arranged: Outpatient: [facility]-PT & OT 11/11/ 11;30-1:15 PM  Comments (or additional information):PT DID WELL AND PROGRESSED QUICKLY TOWARD HER GOALS. HUSBAND WAS HERE DAILY AND PARTICIPATED IN THERAPIES WITH PT. BOTH FEEL READY FOR DC  Patient/Family verbalized understanding of follow-up arrangements: Yes  Individual responsible for coordination of the follow-up plan: ROBERT-HUSBAND Confirmed correct DME delivered: Elease Hashimoto 09/13/2019    Elease Hashimoto

## 2019-09-13 NOTE — Progress Notes (Signed)
Physical Therapy Session Note  Patient Details  Name: [name] MRN: [id number] Date of Birth: 01-13-1946  Today's Date: 09/13/2019 PT Individual Time: 1104-1210 PT Individual Time Calculation (min): 66 min   Short Term Goals: Week 1:  PT Short Term Goal 1 (Week 1): Pt will perform supine<>sit with supervision PT Short Term Goal 2 (Week 1): Pt will perform sit<>stand transfers using LRAD with CGA PT Short Term Goal 3 (Week 1): Pt will perform stand pivot transfers using LRAD with CGA PT Short Term Goal 4 (Week 1): Pt will ambulate at least 117ft using LRAD with CGA PT Short Term Goal 5 (Week 1): Pt will step up/down 4" curb step using LRAD with min assist  Skilled Therapeutic Interventions/Progress Updates:    Pt received sitting in w/c with her husband present and pt agreeable to therapy session. Pt's husband agreeable to participate in hands-on family training this session. Sit<>stands using SPC with CGA for steadying. Ambulated ~363ft to car room using [place] with CGA for steadying - pt demonstrates improved L LE step length and foot clearance while using SPC. Therapist educating pt's husband on proper use of gait belt and standing on pt's L side to provide CGA. Simulated ambulatory car transfer (SUV height) using SPC with CGA for steadying and pt/family education on proper sequencing of transfer. Ambulated ~51ft x2 up/down ramp using SPC with CGA for steadying from therapist initially and then from pt's husband with him demonstrating understanding. Ambulated ~140ft to main therapy gym using SPC with CGA for steadying. Pt stepped up/down curb x2 using SPC with therapist providing education on proper sequencing and CGA for steadying - then pt's husband demonstrating understanding of proper CGA with min cuing. Ascended/descended 4 steps using R UE support on each handrail with CGA for steadying - ascending with reciprocal pattern and descending with step-to pattern - pt educated on step-to pattern  leading with L LE on descent. Ascended/descended 4 steps using R HR only to replicate pt's son's home set-up with side-step pattern and CGA for steadying - pt performed a 2nd time with her husband demonstrating understanding of education on proper positioning and use of gait belt to provide CGA. Patient participated in [facility] and demonstrates increased fall risk as noted by score of   39/56.  (<36= high risk for falls, close to 100%; 37-45 significant >80%; 46-51 moderate >50%; 52-55 lower >25%). Patient educated on test results showing increased risk of falls. Ambulated ~251ft back to room using [facility] with varying CGA/close supervision. Pt left seated in w/c with needs in reach, chair alarm on, and her husband present.  Therapy Documentation Precautions:  Precautions Precautions: Fall Restrictions Weight Bearing Restrictions: No  Pain: Pt reports "not what I would call pain but just twinges in my back" reports it is constant. Pt reports premedicating - provided seated rest breaks and movement for pain management as pt reports the pain increases with static standing causing it to radiate down into her anterior L thigh.   Balance: Balance Balance Assessed: Yes Standardized Balance Assessment Standardized Balance Assessment: Berg Balance Test Berg Balance Test Sit to Stand: Able to stand  independently using hands Standing Unsupported: Able to stand 2 minutes with supervision Sitting with Back Unsupported but Feet Supported on Floor or Stool: Able to sit safely and securely 2 minutes Stand to Sit: Sits safely with minimal use of hands Transfers: Able to transfer safely, minor use of hands Standing Unsupported with Eyes Closed: Able to stand 10 seconds safely  Standing Ubsupported with Feet Together: Able to place feet together independently and stand for 1 minute with supervision From Standing, Reach Forward with Outstretched Arm: Reaches forward but needs supervision From Standing  Position, Pick up Object from Floor: Able to pick up shoe, needs supervision From Standing Position, Turn to Look Behind Over each Shoulder: Turn sideways only but maintains balance(decreased rotation also limited by decreased spinal rotation mobility) Turn 360 Degrees: Able to turn 360 degrees safely but slowly Standing Unsupported, Alternately Place Feet on Step/Stool: Able to complete 4 steps without aid or supervision Standing Unsupported, One Foot in Front: Able to take small step independently and hold 30 seconds Standing on One Leg: Able to lift leg independently and hold equal to or more than 3 seconds Total Score: 39   Therapy/Group: Individual Therapy  Tawana Scale, PT, DPT 09/13/2019, 12:03 PM

## 2019-09-13 NOTE — Progress Notes (Signed)
. [place] PHYSICAL MEDICINE & REHABILITATION PROGRESS NOTE   Subjective/Complaints:  No issues overnite, discussed d/c process  ROS: Patient denies  blurred vision, nausea, vomiting, diarrhea, cough, shortness of breath or chest pain,    Objective:   No results found. Recent Labs    09/12/19 0502  WBC 2.9*  HGB 12.1  HCT 37.6  PLT 173   No results for input(s): NA, K, CL, CO2, GLUCOSE, BUN, CREATININE, CALCIUM in the last 72 hours.  Intake/Output Summary (Last 24 hours) at 09/13/2019 0831 Last data filed at 09/13/2019 0744 Gross per 24 hour  Intake 820 ml  Output -  Net 820 ml     Physical Exam: Vital Signs Blood pressure 121/66, pulse 67, temperature 98 F (36.7 C), resp. rate 18, height 5\' 7"  (1.702 m), weight 61 kg, SpO2 98 %.  Constitutional: No distress . Vital signs reviewed. Pt sitting on mat in gym, NAD HEENT: EOMI, oral membranes moist Neck: supple Cardiovascular: RRR without murmur. No JVD    Respiratory: CTA Bilaterally without wheezes or rales. Normal effort    GI: BS +, non-tender, non-distended  Musculoskeletal:     Comments: No edema or tenderness in extremities ; TTP over L trochanteric bursa Neurological: She is alert and oriented x 3    Good awareness of deficits. Motor: RUE: 4+/5 proximal to distal LUE: 3+ prox to 4/5 distally. Mild left PD LLE: HF, KE 3- to 3/5, ADF limited due to AFO  Skin: Skin is warm and dry. Bruises on UEs B/L from blood draws/IVs Psychiatric: pleasant, engaged in therapy.    Assessment/Plan: 1. 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  Physiatrist is providing close team supervision and 24 hour management of active medical problems listed below.  Physiatrist and rehab team continue to assess barriers to discharge/monitor patient progress toward functional and medical goals  Care Tool:  Bathing    Body  parts bathed by patient: Right arm, Left arm, Chest, Abdomen, Right upper leg, Face, Left lower leg, Right lower leg, Left upper leg         Bathing assist Assist Level: Supervision/Verbal cueing     Upper Body Dressing/Undressing Upper body dressing   What is the patient wearing?: Bra, Pull over shirt    Upper body assist Assist Level: Supervision/Verbal cueing    Lower Body Dressing/Undressing Lower body dressing      What is the patient wearing?: Underwear/pull up     Lower body assist Assist for lower body dressing: Contact Guard/Touching assist     Toileting Toileting    Toileting assist Assist for toileting: Moderate Assistance - Patient 50 - 74%     Transfers Chair/bed transfer  Transfers assist     Chair/bed transfer assist level: Minimal Assistance - Patient > 75%     Locomotion Ambulation   Ambulation assist      Assist level: Minimal Assistance - Patient > 75% Assistive device: No Device Max distance: 200 ft   Walk 10 feet activity   Assist     Assist level: Minimal Assistance - Patient > 75% Assistive device: No Device   Walk 50 feet activity   Assist    Assist level: Minimal Assistance - Patient > 75% Assistive device: No Device    Walk 150 feet activity   Assist Walk 150 feet activity did not occur: Safety/medical concerns  Assist level: Minimal Assistance - Patient > 75%  Assistive device: No Device    Walk 10 feet on uneven surface  activity   Assist Walk 10 feet on uneven surfaces activity did not occur: Safety/medical concerns         Wheelchair     Assist Will patient use wheelchair at discharge?: No(anticipate pt will be a functional ambulator)             Wheelchair 50 feet with 2 turns activity    Assist            Wheelchair 150 feet activity     Assist          Blood pressure 121/66, pulse 67, temperature 98 F (36.7 C), resp. rate 18, height 5\' 7"  (1.702 m), weight 61  kg, SpO2 98 %.  Medical Problem List and Plan: 1.  Increasing left side weakness secondary to minor acute extension along the anterior margin of the old infarct right parietal cortical and subcortical affecting the deep white matter and small focus adjacent cortical brain Tent d/c 11/7 2.  Antithrombotics: -DVT/anticoagulation: Chronic Coumadin for MVR with INR goal 3-3.5  -INR 3.2 10/31  11/2- INR 3.0 D/C enoxaparin             -antiplatelet therapy: Aspirin 81 mg daily 3. Pain Management: Tylenol as needed  4. Mood: Provide emotional support             -antipsychotic agents: N/A 5. Neuropsych: This patient is capable of making decisions on her own behalf. 6. Skin/Wound Care: Routine skin checks 7. Fluids/Electrolytes/Nutrition: Routine in and outs.  8.  Hypertension.  Norvasc 10 mg daily Vitals:   09/12/19 1918 09/13/19 0447  BP: 109/69 121/66  Pulse: 81 67  Resp: 18 18  Temp: 98.2 F (36.8 C) 98 F (36.7 C)  SpO2: 97% 98%  BP controlled 11/5 9.  History of mitral valve replacement.  Continue Coumadin 10.  Hyperlipidemia.  Cont Lipitor 11. Prediabetes- Hgb A1C 6.1- diet ed              Monitor with increased mobility 12. Thrombocytopenia resolved              platelets stable, now off enoxaparin    LOS: 8 days A FACE TO FACE EVALUATION WAS PERFORMED  Charlett Blake 09/13/2019, 8:31 AM

## 2019-09-13 NOTE — Progress Notes (Signed)
Occupational Therapy Session Note  Patient Details  Name: [name] MRN: [id number] Date of Birth: 07-07-1946  Today's Date: 09/13/2019 OT Individual Time: 0906-1003 OT Individual Time Calculation (min): 57 min    Short Term Goals: Week 1:  OT Short Term Goal 1 (Week 1): Pt will complete LB bathing with min guard assist. OT Short Term Goal 2 (Week 1): Pt will complete LB dressing with min guard assist. OT Short Term Goal 3 (Week 1): Pt will complete toilet transfer with min guard assist stand pivot OT Short Term Goal 4 (Week 1): Pt will complete FM coordination tasks for the LUE with modified independence following handout.  Skilled Therapeutic Interventions/Progress Updates:    Session 1 [id number]) Pt completed toilet transfer with min guard assist to start session.  She then completed functional mobility out to the sink for oral hygiene.  LUE was incorporated for brushing her teeth with increased time and supervision.  Pt completed functional mobility down to the therapy gym for next part of session with min assist and no assistive device.  Had pt work on lateral weightshifts in sitting with emphasis on active left trunk shortening and active left elbow extension with shoulder depression.  Had pt work on picking up and placing checkers with the RUE to promote lateral weight shift to the right.  Next, pt transitioned into working on reciprical scooting with overall mod instructional cueing and min assist.  Finished session with return to the room.  Min facilitation for mobility and for maintaining upright posture and decreased left trunk elongation during mobility.  Pt left up in the wheelchair with call button and phone in reach and safety alarm pad in place.       Session 2  [id number])  Pt ambulated down to the therapy gym with min assist and no assistive device.  Had her sit on the therapy mat with education provided on FM coordination tasks as well as LUE functional tasks to be  completed daily at home.  She was able to return demonstrate picking up large glass beads and manipulating in the left hand with increased time from finger tips to palm and back to fingertips.  She did exhibit some drops with task.  Also had her picking up and placing cards one at at time.  Emphasis on full elbow extension with the LUE when attempting to place the card in the appropriate location.  She also worked on picking up and placing larger poker chips with in-hand manipulation as well.  Educated pt on the importance of upright midline posture when working on coordination tasks at home as well and to avoid compensatory strategies in the shoulder and trunk.  Pt reports understanding and handout was provided for FM coordination tasks.  Pt ambulated back to the room with call button and phone in reach and pt sitting up in the wheelchair.  Instructed spouse to provide supervision with any tasks at home that required her to get up or transfer to surfaces.  He voiced understanding as well.    Therapy Documentation Precautions:  Precautions Precautions: Fall Restrictions Weight Bearing Restrictions: No  Pain:  No report of pain  ADL: See Care Tool Section for some details of mobility and selfcare  Therapy/Group: Individual Therapy  Dora Clauss OTR/L 09/13/2019, 3:49 PM

## 2019-09-13 NOTE — Discharge Instructions (Signed)
Inpatient Rehab Discharge Instructions  [facility] Discharge date and time: No discharge date for patient encounter.   Activities/Precautions/ Functional Status: Activity: activity as tolerated Diet: regular diet Wound Care: none needed Functional status:  ___ No restrictions     ___ Walk up steps independently ___ 24/7 supervision/assistance   ___ Walk up steps with assistance ___ Intermittent supervision/assistance  ___ Bathe/dress independently ___ Walk with walker     __x_ Bathe/dress with assistance ___ Walk Independently    ___ Shower independently ___ Walk with assistance    ___ Shower with assistance ___ No alcohol     ___ Return to work/school ________  Special Instructions: No driving smoking or alcohol  Follow up office check INR on 09/18/2019   [place] [facility]Cou[facility] phone [phone] fax [phone]   COMMUNITY REFERRALS UPON DISCHARGE:    Outpatient: PT & OT  Agency:[facility] [id number]   Date of Last Service:11/  Appointment Date/Time:NOVEMBER 11 Wednesday 11:30-1:15 PM ( BOTH OT & PT )  Medical Equipment/Items Ordered:HAS ALL NEEDED EQUIPMENT FROM PAST ADMISSIONS    STROKE/TIA DISCHARGE INSTRUCTIONS SMOKING Cigarette smoking nearly doubles your risk of having a stroke & is the single most alterable risk factor  If you smoke or have smoked in the last 12 months, you are advised to quit smoking for your health.  Most of the excess cardiovascular risk related to smoking disappears within a year of stopping.  Ask you doctor about anti-smoking medications  [place] Quit Line: 1-800-QUIT NOW  Free Smoking Cessation Classes [phone]  CHOLESTEROL Know your levels; limit fat & cholesterol in your diet  Lipid Panel     Component Value Date/Time   CHOL 125 09/04/2019 0359   TRIG 29 09/04/2019 0359   HDL 54 09/04/2019 0359   CHOLHDL 2.3 09/04/2019 0359   VLDL 6 09/04/2019 0359   LDLCALC 65 09/04/2019 0359      Many  patients benefit from treatment even if their cholesterol is at goal.  Goal: Total Cholesterol (CHOL) less than 160  Goal:  Triglycerides (TRIG) less than 150  Goal:  HDL greater than 40  Goal:  LDL (LDLCALC) less than 100   BLOOD PRESSURE American Stroke Association blood pressure target is less that 120/80 mm/Hg  Your discharge blood pressure is:  BP: 129/83  Monitor your blood pressure  Limit your salt and alcohol intake  Many individuals will require more than one medication for high blood pressure  DIABETES (A1c is a blood sugar average for last 3 months) Goal HGBA1c is under 7% (HBGA1c is blood sugar average for last 3 months)  Diabetes: No known diagnosis of diabetes    Lab Results  Component Value Date   HGBA1C 6.1 (H) 09/04/2019     Your HGBA1c can be lowered with medications, healthy diet, and exercise.  Check your blood sugar as directed by your physician  Call your physician if you experience unexplained or low blood sugars.  PHYSICAL ACTIVITY/REHABILITATION Goal is 30 minutes at least 4 days per week  Activity: Increase activity slowly, Therapies: Physical Therapy: Home Health Return to work:   Activity decreases your risk of heart attack and stroke and makes your heart stronger.  It helps control your weight and blood pressure; helps you relax and can improve your mood.  Participate in a regular exercise program.  Talk with your doctor about the best form of exercise for you (dancing, walking, swimming, cycling).  DIET/WEIGHT Goal is to maintain a healthy  weight  Your discharge diet is:  Diet Order            Diet Heart Room service appropriate? Yes with Assist; Fluid consistency: Thin  Diet effective now              liquids Your height is:  Height: 5\' 7"  (170.2 cm) Your current weight is: Weight: 61 kg Your Body Mass Index (BMI) is:  BMI (Calculated): 21.06  Following the type of diet specifically designed for you will help prevent another  stroke.  Your goal weight range is:    Your goal Body Mass Index (BMI) is 19-24.  Healthy food habits can help reduce 3 risk factors for stroke:  High cholesterol, hypertension, and excess weight.  RESOURCES Stroke/Support Group:  Call [phone]   STROKE EDUCATION PROVIDED/REVIEWED AND GIVEN TO PATIENT Stroke warning signs and symptoms How to activate emergency medical system (call 911). Medications prescribed at discharge. Need for follow-up after discharge. Personal risk factors for stroke. Pneumonia vaccine given:  Flu vaccine given:  My questions have been answered, the writing is legible, and I understand these instructions.  I will adhere to these goals & educational materials that have been provided to me after my discharge from the hospital.    phone [phone] fax [phone]   My questions have been answered and I understand these instructions. I will adhere to these goals and the provided educational materials after my discharge from the hospital.  Patient/Caregiver Signature _______________________________ Date __________  Clinician Signature _______________________________________ Date __________  Please bring this form and your medication list with you to all your follow-up doctor's appointments.

## 2019-09-13 NOTE — Progress Notes (Signed)
ANTICOAGULATION CONSULT NOTE   Pharmacy Consult for Warfarin  Indication: mechanical MVR   Patient Measurements: Height: 5\' 7"  (170.2 cm) Weight: 134 lb 7.7 oz (61 kg) IBW/kg (Calculated) : 61.6  Vital Signs: Temp: 98 F (36.7 C) (11/05 0447) BP: 121/66 (11/05 0447) Pulse Rate: 67 (11/05 0447)  Labs: Recent Labs    09/11/19 0444 09/12/19 0502 09/13/19 0458  HGB  --  12.1  --   HCT  --  37.6  --   PLT  --  173  --   LABPROT 30.3* 32.5* 32.9*  INR 3.0* 3.2* 3.3*    Estimated Creatinine Clearance: 60.3 mL/min (by C-G formula based on SCr of 0.67 mg/dL).  Assessment: 73 year old female presented to the ED with worsening left sided weakness. She is on chronic warfarin for history of a mechanical valve.   INR therapeutic today at 3.3  PTA warfarin dose: 7.5 mg q Mon/Fri, 5 mg on all other days  Goal of Therapy:  INR 3-3.5 Monitor platelets by anticoagulation protocol: Yes   Plan:  Try Warfarin 7.5 mg po daily for now Daily INR  Thank you Anette Guarneri, PharmD  Please utilize [facility] for appropriate phone number to reach the unit pharmacist ([email or web address])   09/13/2019 8:18 AM

## 2019-09-13 NOTE — Plan of Care (Signed)
  Problem: Consults Goal: RH STROKE PATIENT EDUCATION Description: See Patient Education module for education specifics  Outcome: Progressing Goal: Nutrition Consult-if indicated Outcome: Progressing   Problem: RH BOWEL ELIMINATION Goal: RH STG MANAGE BOWEL WITH ASSISTANCE Description: STG Manage Bowel with supervision Assistance. Outcome: Progressing Goal: RH STG MANAGE BOWEL W/MEDICATION W/ASSISTANCE Description: STG Manage Bowel with Medication with supervision Assistance. Outcome: Progressing   Problem: RH BLADDER ELIMINATION Goal: RH STG MANAGE BLADDER WITH ASSISTANCE Description: STG Manage Bladder With supervision Assistance Outcome: Progressing   Problem: RH SKIN INTEGRITY Goal: RH STG SKIN FREE OF INFECTION/BREAKDOWN Description: Skin to remain free from breakdown while on rehab with min assist. Outcome: Progressing Goal: RH STG MAINTAIN SKIN INTEGRITY WITH ASSISTANCE Description: STG Maintain Skin Integrity With min Assistance. Outcome: Progressing Goal: RH STG ABLE TO PERFORM INCISION/WOUND CARE W/ASSISTANCE Description: STG Able To Perform Incision/Wound Care With min Assistance. Outcome: Progressing   Problem: RH SAFETY Goal: RH STG ADHERE TO SAFETY PRECAUTIONS W/ASSISTANCE/DEVICE Description: STG Adhere to Safety Precautions With supervision Assistance and appropriate assistive Device. Outcome: Progressing   Problem: RH PAIN MANAGEMENT Goal: RH STG PAIN MANAGED AT OR BELOW PT'S PAIN GOAL Description: <3 on a 0-10 pain scale. Outcome: Progressing   Problem: RH KNOWLEDGE DEFICIT Goal: RH STG INCREASE KNOWLEDGE OF HYPERTENSION Description: Patient will be able to demonstrate knowledge of HTN, medications, BP parameters, and dietary restrictions with min assist from rehab staff. Outcome: Progressing Goal: RH STG INCREASE KNOWLEGDE OF HYPERLIPIDEMIA Description: Patient will demonstrate knowledge of HLD medications and dietary restrictions with min assist from  rehab staff. Outcome: Progressing Goal: RH STG INCREASE KNOWLEDGE OF STROKE PROPHYLAXIS Description: Patient will demonstrate knowledge of stroke prevention medications with min assist from rehab staff. Outcome: Progressing

## 2019-09-13 NOTE — Discharge Summary (Signed)
Physician Discharge Summary  Patient ID: [name] MRN: [id number] DOB/AGE: 1946/06/10 73 y.o.  Admit date: 09/05/2019 Discharge date: 09/15/2019  Discharge Diagnoses:  Principal Problem:   CVA (cerebral vascular accident) [facility]) Active Problems:   Subcortical infarction [facility]) DVT prophylaxis Hypertension History of mitral valve replacement Hyperlipidemia Prediabetes  Discharged Condition: Stable  Significant Diagnostic Studies: Ct Angio Head W Or Wo Contrast  Result Date: 09/04/2019 CLINICAL DATA:  Stroke workup.  Left-sided weakness EXAM: CT ANGIOGRAPHY HEAD AND NECK TECHNIQUE: Multidetector CT imaging of the head and neck was performed using the standard protocol during bolus administration of intravenous contrast. Multiplanar CT image reconstructions and MIPs were obtained to evaluate the vascular anatomy. Carotid stenosis measurements (when applicable) are obtained utilizing NASCET criteria, using the distal internal carotid diameter as the denominator. CONTRAST:  69mL OMNIPAQUE IOHEXOL 350 MG/ML SOLN COMPARISON:  09/24/2017 FINDINGS: CTA NECK FINDINGS Aortic arch: Atherosclerotic plaque.  Two vessel branching. Right carotid system: Vessels are smooth and widely patent. Limited atheromatous changes. Left carotid system: Vessels are smooth and widely patent. Minimal atherosclerotic plaque at the ICA bulb. Vertebral arteries: Proximal subclavian atherosclerosis without stenosis or ulceration. Slight left vertebral artery dominance. Both vertebral arteries are smooth and widely patent to the dura. Skeleton: Degenerative disc narrowing and facet spurring. Remote right parietal craniotomy, unremarkable. Other neck: No incidental mass or swelling. Upper chest: Negative Review of the MIP images confirms the above findings CTA HEAD FINDINGS Anterior circulation: Vessels are smooth and widely patent. No branch occlusion, aneurysm, or beading. Posterior circulation: Vertebrobasilar arteries are  smooth and widely patent. Unremarkable cerebellar branches. No branch occlusion, beading, or aneurysm. Venous sinuses: Unremarkable in the arterial phase Anatomic variants: Fetal type left PCA Review of the MIP images confirms the above findings IMPRESSION: 1. No emergent finding. 2. Mild for age atherosclerosis with no flow limiting stenosis or ulceration affecting major vessels. Electronically Signed   By: Monte Fantasia M.D.   On: 09/04/2019 06:59   Ct Head Wo Contrast  Result Date: 09/02/2019 CLINICAL DATA:  Left-sided weakness EXAM: CT HEAD WITHOUT CONTRAST TECHNIQUE: Contiguous axial images were obtained from the base of the skull through the vertex without intravenous contrast. COMPARISON:  09/24/2017 FINDINGS: Brain: Stable encephalomalacia is noted in the right frontal parietal region unchanged from the prior exam. Overlying craniotomy defect is seen. Scattered chronic atrophic changes are noted. Prior left thalamic infarct is again noted stable. Chronic cerebellar lacunar infarcts are noted as well. Vascular: No hyperdense vessel or unexpected calcification. Skull: Craniotomy defect is again noted as described. Sinuses/Orbits: No acute finding. Other: None. IMPRESSION: Chronic changes without acute abnormality. Electronically Signed   By: Inez Catalina M.D.   On: 09/02/2019 21:31   Ct Angio Neck W Or Wo Contrast  Result Date: 09/04/2019 CLINICAL DATA:  Stroke workup.  Left-sided weakness EXAM: CT ANGIOGRAPHY HEAD AND NECK TECHNIQUE: Multidetector CT imaging of the head and neck was performed using the standard protocol during bolus administration of intravenous contrast. Multiplanar CT image reconstructions and MIPs were obtained to evaluate the vascular anatomy. Carotid stenosis measurements (when applicable) are obtained utilizing NASCET criteria, using the distal internal carotid diameter as the denominator. CONTRAST:  3mL OMNIPAQUE IOHEXOL 350 MG/ML SOLN COMPARISON:  09/24/2017 FINDINGS: CTA  NECK FINDINGS Aortic arch: Atherosclerotic plaque.  Two vessel branching. Right carotid system: Vessels are smooth and widely patent. Limited atheromatous changes. Left carotid system: Vessels are smooth and widely patent. Minimal atherosclerotic plaque at the ICA bulb. Vertebral arteries: Proximal subclavian atherosclerosis  without stenosis or ulceration. Slight left vertebral artery dominance. Both vertebral arteries are smooth and widely patent to the dura. Skeleton: Degenerative disc narrowing and facet spurring. Remote right parietal craniotomy, unremarkable. Other neck: No incidental mass or swelling. Upper chest: Negative Review of the MIP images confirms the above findings CTA HEAD FINDINGS Anterior circulation: Vessels are smooth and widely patent. No branch occlusion, aneurysm, or beading. Posterior circulation: Vertebrobasilar arteries are smooth and widely patent. Unremarkable cerebellar branches. No branch occlusion, beading, or aneurysm. Venous sinuses: Unremarkable in the arterial phase Anatomic variants: Fetal type left PCA Review of the MIP images confirms the above findings IMPRESSION: 1. No emergent finding. 2. Mild for age atherosclerosis with no flow limiting stenosis or ulceration affecting major vessels. Electronically Signed   By: Monte Fantasia M.D.   On: 09/04/2019 06:59   Mr Brain Wo Contrast  Result Date: 09/03/2019 CLINICAL DATA:  Hypertension, diabetes and hyperlipidemia. Stroke with left hemiparesis. Worsening weakness on the left. Symptoms began yesterday. EXAM: MRI HEAD WITHOUT CONTRAST TECHNIQUE: Multiplanar, multiecho pulse sequences of the brain and surrounding structures were obtained without intravenous contrast. COMPARISON:  Head CT 09/02/2019.  MRI 09/24/2017. FINDINGS: Brain: There has been extensive old hemorrhagic infarction in the right parietal cortical and subcortical brain. Overlying old craniotomy. The area is affected by encephalomalacia and gliosis. There are  some chronic foci of tissue restricted diffusion that I do not think relate to acute infarction. Along the anterior margin, there is a more linear focus of restricted diffusion within the adjacent white matter and a small focus in the adjacent cortex which could represent a tiny extension of the infarction. No other acute infarction suspected. Elsewhere, there chronic small-vessel ischemic changes of the pons. There are numerous old small vessel cerebellar infarctions. Cerebral hemispheres show old small vessel infarctions affecting the thalami and chronic small-vessel ischemic changes the deep white matter without evidence of other large vessel territory insult. No sign recent hemorrhage, hydrocephalus or extra-axial fluid collection. I suspect that there is a tiny meningioma along the left side of the posterior falx in the posterior parietal region, no more than 2-3 mm in thickness. This is unchanged since 2018. Vascular: Major vessels at the base of the brain show flow. Skull and upper cervical spine: Otherwise negative Sinuses/Orbits: Clear/normal Other: None IMPRESSION: Old hemorrhagic infarction in the right parietal cortical and subcortical brain which has progressed to atrophy, encephalomalacia and gliosis. Suspicion of a minor acute extension along the anterior margin of the old infarction affecting the deep white matter and a small focus adjacent cortical brain. Extensive old ischemic changes elsewhere throughout the brain as outlined above. Tiny stable meningioma along the left posterior falx without mass-effect upon the brain or apparent change since 2018. Electronically Signed   By: Nelson Chimes M.D.   On: 09/03/2019 08:34    Labs:  Basic Metabolic Panel: No results for input(s): NA, K, CL, CO2, GLUCOSE, BUN, CREATININE, CALCIUM, MG, PHOS in the last 168 hours.  CBC: Recent Labs  Lab 09/09/19 0446 09/12/19 0502  WBC 3.5* 2.9*  HGB 13.0 12.1  HCT 39.7 37.6  MCV 90.2 90.8  PLT 166 173     CBG: No results for input(s): GLUCAP in the last 168 hours.  Family history.  Mother with bone cancer as well as CAD hyperlipidemia and hypertension.  Father with CVA and hypertension.  Denies colon cancer or diabetes mellitus  Brief HPI:   [name] is a 73 y.o. right-handed female with history  of hypertension, prediabetes, mechanical mitral valve replacement on anticoagulation as well as low-dose aspirin, multiple CVAs with residual left-sided weakness.  Per chart review lives with spouse used a cane in the community for ambulation furniture walking within the home as well as a left AFO brace.  Husband does assist with some basic ADLs.  Resented 09/03/2019 with worsening left side weakness.  Denied chest pain or shortness of breath.  INR on admission of 2.9.  Cranial CT scan unremarkable for acute intracranial process, chronic changes.  CT angiogram of head and neck with no emergent findings.  MRI showed old hemorrhagic infarct in the right parietal cortical and subcortical brain which had progressed atrophy, encephalomalacia.  Suspicion of minor acute extension along the anterior margin of the old infarction affecting the deep white matter and small focus adjacent cortical brain.  Tiny stable meningioma along the left posterior falx without mass-effect no change since 2018.  Echocardiogram with ejection fraction of 60%.  Neurology follow-up remained on low-dose aspirin as well as Coumadin.  Patient was admitted for a comprehensive rehab program.   Hospital Course: [name] was admitted to rehab 09/05/2019 for inpatient therapies to consist of PT, ST and OT at least three hours five days a week. Past admission physiatrist, therapy team and rehab RN have worked together to provide customized collaborative inpatient rehab.  Pertaining to patient minor acute extension CVA along the anterior margin of the old infarct right parietal cortical and subcortical affecting the deep white matter.  She  remained on aspirin and Coumadin therapy with noted history of mitral valve replacement no bleeding episodes noted with a goal INR of 3-3.5.  She would follow-up neurology services.  She would follow-up with [facility] for ongoing management of Coumadin.  Blood pressures controlled on low-dose Norvasc.  Lipitor ongoing for hyperlipidemia.  Prediabetes hemoglobin A1c 6.1 diet education as directed.  No bowel or bladder disturbances.   Blood pressures were monitored on TID basis and controlled  Diabetes has been monitored with ac/hs CBG checks and SSI was use prn for tighter BS control.   [name] is continent of bowel and bladder.  [name] has made gains during rehab stay and is attending therapies  [name] will continue to receive follow up therapies after discharge  Rehab course: During patient's stay in rehab weekly team conferences were held to monitor patient's progress, set goals and discuss barriers to discharge. At admission, patient required minimum to moderate assist 50 feet rolling walker, minimal assist sit to stand, supervision side-lying to sitting, minimal assist upper body bathing minimal guard lower body bathing minimal guard upper body dressing minimal assist lower body dressing minimal assist toilet transfers  Physical exam.  Blood pressure 134/84 pulse 71 temperature 98 respirations 16 oxygen saturations 96% room air Constitutional well-developed well-nourished HEENT Head.  Normocephalic and atraumatic Eyes.  Pupils round and reactive to light no discharge without nystagmus Respiratory effort normal no respiratory distress without wheezes Cardiovascular regular rate rhythm without extra sounds or murmur heard GI.  Exhibits no distention nontender positive bowel sounds Musculoskeletal no edema or tenderness extremities Neurological alert makes good eye contact with examiner good awareness of deficits.  Motor strength right upper extremity 4+ out of 5 proximal to distal left  upper extremity shoulder abduction elbow flexion extension 3+ 4- out of 5 hand grip 4 out of 5.  Left lower extremity hip flexion knee extension 3- out of 5 ankle dorsi flexion limited due to AFO.  [name]  has  had improvement in activity tolerance, balance, postural control as well as ability to compensate for deficits. [name] has had improvement in functional use RUE/LUE  and RLE/LLE as well as improvement in awareness.  Working with energy conservation techniques.  Ambulates up to 200 feet supervision assist contact-guard.  Noted to have some ataxia of the left lower extremity.  Leaves in and out of cones contact-guard assist supervision.  Perform step through pattern 6x1 with contact-guard assist.  Completed toilet transfers with minimal guard assist to start sessions completed functional ability out to the sink for oral hygiene completed functional ability down to the therapy gym for sessions with minimal assist no assistive device.  She was able to fully communicate her needs.  Full family teaching completed plan discharge to home       Disposition: Discharge disposition: 01-Home or Self Care     Discharge to home   Diet: Regular consistency  Special Instructions: No driving smoking or alcohol  Follow-up [facility] 09/18/2019 for monitoring of INR for mitral valve replacement history of CVA with goal INR of 3.0-3.5.  Medications at discharge. 1.  Tylenol as needed 2.  Norvasc 10 mg p.o. daily 3.  Aspirin 81 mg p.o. daily 4.  Lipitor 20 mg p.o. daily 5.  Colace 100 mg p.o. daily 6.  Protonix 40 mg p.o. nightly 7.  Coumadin 5 mg adjusted accordingly  Discharge Instructions    Ambulatory referral to Neurology   Complete by: As directed    An appointment is requested in approximately 4 weeks right parietal cortical subcortical CVA   Ambulatory referral to Physical Medicine Rehab   Complete by: As directed    Moderate complexity follow-up 1 to 2 weeks right parietal  cortical subcortical infarction      Follow-up Information    Kirsteins, Luanna Salk, MD Follow up.   Specialty: Physical Medicine and Rehabilitation Why: Office to call for appointment Contact information: [place] [address] [phone]        Nahser, Wonda Cheng, MD Follow up.   Specialty: Cardiology Why: Call for appointment Contact information: [address] [place] [address] [phone]        Nahser, Wonda Cheng, MD .   Specialty: Cardiology Contact information: [place] [address] [phone]           Signed: Cathlyn Parsons 09/14/2019, 5:16 AM

## 2019-09-13 NOTE — Progress Notes (Signed)
Occupational Therapy Discharge Summary  Patient Details  Name: [name] MRN: [id number] Date of Birth: 06-18-1946  Today's Date: 09/13/2019 OT Individual Time: 7616-0737 OT Individual Time Calculation (min): 61 min    Patient has met 12 of 12 long term goals due to improved activity tolerance, improved balance, ability to compensate for deficits, functional use of  LEFT upper extremity and improved coordination.  Patient to discharge at overall Supervision level.  Patient's care partner is independent to provide the necessary physical assistance at discharge.    Reasons goals not met: n/a  Recommendation:  Patient will benefit from ongoing skilled OT services in outpatient setting to continue to advance functional skills in the area of BADL and Reduce care partner burden.  Pt continues to need supervision for safety with completion of selfcare tasks and functional transfers.  Feel she will benefit from continued OT at outpatient level in order to help increase overall independence level to modified independent as well as help decrease her current abnormal movement patterns and implement a more normal motor pattern of recovery.    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 Restrictions Weight Bearing Restrictions: No  Vital Signs Therapy Vitals Temp: (!) 97.4 F (36.3 C) Pulse Rate: 76 Resp: 20 BP: 116/75 Patient Position (if appropriate): Sitting Oxygen Therapy SpO2: 97 % O2 Device: Room Air ADL ADL Eating: Modified independent Where Assessed-Eating: Wheelchair Grooming: Modified independent Where Assessed-Grooming: Sitting at sink Upper Body Bathing: Supervision/safety Where Assessed-Upper  Body Bathing: Wheelchair, Sitting at sink Lower Body Bathing: Supervision/safety Where Assessed-Lower Body Bathing: Wheelchair, Standing at sink, Sitting at sink Upper Body Dressing: Supervision/safety Where Assessed-Upper Body Dressing: Sitting at sink Lower Body Dressing: Supervision/safety Where Assessed-Lower Body Dressing: Sitting at sink, Standing at sink Toileting: Supervision/safety Where Assessed-Toileting: Glass blower/designer: Close supervision Toilet Transfer Method: Counselling psychologist: Raised toilet seat Vision Baseline Vision/History: Wears glasses Wears Glasses: At all times Patient Visual Report: Blurring of vision Vision Assessment?: Yes Eye Alignment: Within Functional Limits Ocular Range of Motion: Within Functional Limits Alignment/Gaze Preference: Within Defined Limits Tracking/Visual Pursuits: Decreased smoothness of horizontal tracking(Pt with jerky tracking noted when following Marsden ball side to side in the left eye especially.) Saccades: Within functional limits Convergence: Impaired (comment)(decreased convergence noted at times) Visual Fields: No apparent deficits Perception  Perception: Within Functional Limits Praxis Praxis: Intact Cognition Overall Cognitive Status: Within Functional Limits for tasks assessed Arousal/Alertness: Awake/alert Orientation Level: Oriented X4 Attention: Focused;Sustained Focused Attention: Appears intact Sustained Attention: Appears intact Memory: Appears intact Awareness: Appears intact Problem Solving: Appears intact Safety/Judgment: Appears intact Sensation Sensation Light Touch: Impaired Detail Light Touch Impaired Details: Impaired LUE Proprioception: Impaired Detail Proprioception Impaired Details: Impaired LUE Stereognosis: Impaired Detail Stereognosis Impaired Details: Impaired LUE Coordination Gross Motor Movements are Fluid and Coordinated: No Fine Motor Movements are Fluid and  Coordinated: No Coordination and Movement Description: Pt with 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  She uses the LUE  functionally at a diminshed level with selfcare tasks, but exhibits slight flexor tone in the elbow and wrist with use.  Decreased FM coordination with fastening buttons as well as decreased efficiency with reach above chest level. Motor  Motor Motor: Hemiplegia;Abnormal postural alignment and control Motor - Discharge Observations: Pt stil with mod  LUE and LLE hemiparesis Mobility  Bed Mobility Bed Mobility: Supine to Sit Supine to Sit: Independent with assistive device Transfers Sit to Stand: Supervision/Verbal cueing Stand to Sit: Supervision/Verbal cueing  Trunk/Postural Assessment  Cervical Assessment Cervical Assessment: Exceptions to WFL(forward cervical protraction) Thoracic Assessment Thoracic Assessment: Exceptions to WFL(thoracic rounding) Lumbar Assessment Lumbar Assessment: Exceptions to WFL(posterior pelvic tilt)  Balance Balance Balance Assessed: Yes Static Sitting Balance Static Sitting - Balance Support: Feet supported Static Sitting - Level of Assistance: 7: Independent Dynamic Sitting Balance Dynamic Sitting - Balance Support: During functional activity Dynamic Sitting - Level of Assistance: 5: Stand by assistance Static Standing Balance Static Standing - Balance Support: During functional activity Static Standing - Level of Assistance: 5: Stand by assistance Dynamic Standing Balance Dynamic Standing - Balance Support: During functional activity Dynamic Standing - Level of Assistance: 5: Stand by assistance Extremity/Trunk Assessment RUE Assessment RUE Assessment: Within Functional Limits LUE Assessment LUE Assessment: Exceptions to [facility] Active Range of Motion (AROM) Comments: Pt with AROM in all joints with shoulder flexion 0-150 degrees with slight elbow flexion noted with shoulder flexion.  Brunnstrum stage V in the arm  with fill digit AROM noted. General Strength Comments: Pt with Brunnstrum stage V in the hand and stage VI in the digits.  Decreased FM coordination noted with opening small bottle tops and increased time needed to tie her shoes.  General strength 3/5 throughout.  Increased flexor tone in the left elbow flexors as well as wrist extension.   MCGUIRE,JAMES OTR/L 09/13/2019, 5:28 PM

## 2019-09-14 ENCOUNTER — Inpatient Hospital Stay (HOSPITAL_COMMUNITY): Payer: Medicare Other | Admitting: Physical Therapy

## 2019-09-14 ENCOUNTER — Inpatient Hospital Stay (HOSPITAL_COMMUNITY): Payer: Medicare Other

## 2019-09-14 ENCOUNTER — Inpatient Hospital Stay (HOSPITAL_COMMUNITY): Payer: Medicare Other | Admitting: Occupational Therapy

## 2019-09-14 LAB — PROTIME-INR
INR: 3.9 — ABNORMAL HIGH (ref 0.8–1.2)
Prothrombin Time: 37.8 seconds — ABNORMAL HIGH (ref 11.4–15.2)

## 2019-09-14 MED ORDER — ACETAMINOPHEN 325 MG PO TABS: 650.0000 mg | ORAL_TABLET | ORAL | Status: DC | PRN

## 2019-09-14 MED ORDER — ATORVASTATIN CALCIUM 20 MG PO TABS: 20.0000 mg | ORAL_TABLET | Freq: Every day | ORAL | 0 refills | Status: DC

## 2019-09-14 MED ORDER — PANTOPRAZOLE SODIUM 40 MG PO TBEC: 40.0000 mg | DELAYED_RELEASE_TABLET | Freq: Every day | ORAL | 0 refills | Status: DC

## 2019-09-14 MED ORDER — AMLODIPINE BESYLATE 10 MG PO TABS: 10.0000 mg | ORAL_TABLET | Freq: Every day | ORAL | 0 refills | Status: DC

## 2019-09-14 MED ORDER — WARFARIN SODIUM 2.5 MG PO TABS
2.5000 mg | ORAL_TABLET | Freq: Once | ORAL | Status: AC
  Administered 2019-09-14: 2.5 mg via ORAL
  Filled 2019-09-14: qty 1

## 2019-09-14 NOTE — Progress Notes (Signed)
Occupational Therapy Session Note  Patient Details  Name: DYMONIQUE GILLEN MRN: [id number] Date of Birth: 1946-08-20  Today's Date: 09/14/2019 OT Individual Time: [id number] OT Individual Time Calculation (min): 60 min    Short Term Goals: Week 1:  OT Short Term Goal 1 (Week 1): Pt will complete LB bathing with min guard assist. OT Short Term Goal 2 (Week 1): Pt will complete LB dressing with min guard assist. OT Short Term Goal 3 (Week 1): Pt will complete toilet transfer with min guard assist stand pivot OT Short Term Goal 4 (Week 1): Pt will complete FM coordination tasks for the LUE with modified independence following handout. Week 2:     Skilled Therapeutic Interventions/Progress Updates:    1:1 Pt reports dressing with assistance of nursing. PT is able to don shoe with AFO herself with extra time. Pt ambulated from her room to the gym without AD with min A at a slow speed with occasional LOB requiring min A to recover. Had pt carry clip board with left UE to promote normal posturing and a functional job for left UE.  NMR with focus on bilateral UB symmetrical movement with normal patterns of movement, grasp and in hand manipulation, dynamic balance, activity tolerance and dynamic standing balance.  -In stand pt bending down to pick clothespin of moderate difficulty off the floor with contact guard and then returning to full upright standing to attach to clothespin line  (also demonstrating in hand manipulation). - symmetrical UB work with zoom ball and hula hoop  -throwing and catching bilaterally with rebounder with sit to stands in between.      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

## 2019-09-14 NOTE — Progress Notes (Signed)
ANTICOAGULATION CONSULT NOTE   Pharmacy Consult for Warfarin  Indication: mechanical MVR   Patient Measurements: Height: 5\' 7"  (170.2 cm) Weight: 134 lb 7.7 oz (61 kg) IBW/kg (Calculated) : 61.6  Vital Signs: Temp: 97.6 F (36.4 C) (11/06 0524) Temp Source: Oral (11/06 0524) BP: 103/69 (11/06 0524) Pulse Rate: 73 (11/06 0524)  Labs: Recent Labs    09/12/19 0502 09/13/19 0458 09/14/19 0531  HGB 12.1  --   --   HCT 37.6  --   --   PLT 173  --   --   LABPROT 32.5* 32.9* 37.8*  INR 3.2* 3.3* 3.9*    Estimated Creatinine Clearance: 60.3 mL/min (by C-G formula based on SCr of 0.67 mg/dL).  Assessment: 73 year old female presented to the ED with worsening left sided weakness. She is on chronic warfarin for history of a mechanical valve.   INR now high at 3.9  PTA warfarin dose: 7.5 mg q Mon/Fri, 5 mg on all other days  Goal of Therapy:  INR 3-3.5 Monitor platelets by anticoagulation protocol: Yes   Plan:  Warfarin 2.5 mg po x 1 tonight Daily INR  Thank you Anette Guarneri, PharmD  Please utilize Amion for appropriate phone number to reach the unit pharmacist ([place])   09/14/2019 8:01 AM

## 2019-09-14 NOTE — Progress Notes (Signed)
Physical Therapy Discharge Summary  Patient Details  Name: [name] MRN: [id number] Date of Birth: July 17, 1946  Today's Date: 09/14/2019 PT Individual Time: 1013-1107 PT Individual Time Calculation (min): 54 min    Patient has met 10 of 10 long term goals due to improved activity tolerance, improved balance, improved postural control, increased strength, decreased pain, ability to compensate for deficits, functional use of  left upper extremity and left lower extremity, improved awareness and improved coordination.  Patient to discharge at an ambulatory level Supervision.   Patient's care partner attended hands-on family training and is independent to provide the necessary physical and cognitive assistance at discharge.  All goals met.  Recommendation:  Patient will benefit from ongoing skilled PT services in outpatient setting to continue to advance safe functional mobility, address ongoing impairments in LE weakness, L LE coordination, dynamic standing balance, gait with LRAD, stair navigation, activity tolerance, and minimize fall risk.  Equipment: No equipment provided as pt has all necessary equipment  Reasons for discharge: treatment goals met and discharge from hospital  Patient/family agrees with progress made and goals achieved: Yes  Skilled Therapeutic Interventions/Progress Updates:  Patient received sitting in w/c with her husband present and pt agreeable to therapy session. Pt's husband reports feeling confident with assisting pt with all functional mobility tasks performed during yesterdays' session and reports no questions/concerns. Pt agreeable to therapy session. Pt/husband educated that pt needs to use [facility] for all standing/ambulatory tasks and that the husband needs to provide pt with 24hr support at home including close supervision for all standing/ambulatory tasks. Pt wearing L LE AFO. Pt performed sit<>stands using SCP with supervision throughout session. Ambulated  ~370f [facility] Ambulatory simulated car transfer (SUV height) using SPC with close supervision. Ambulated ~127fx2 up/down ramp using SPC with close supervision. Ambulated ~[id number] main therapy gym [place]. Ascended/descended 12 steps using R UE support on each handrail with close supervision - performed reciprocal pattern on ascent and step-to pattern progressed to reciprocal pattern on descent.  Performed the following standing exercises in preparation for HEP with B UE support on counter 2 sets of 15 reps per LE: (therapist removed L LE AFO to allow increased ankle ROM during exercises - educated pt on doing this at home - educated pt on need for her husband to provide close supervision for safety while performing exercises) - standing heel raises with cuing for equal L/R LE weightbearing as pt compensates with increased R LE weight bearing - standing hip abduction with external target and mod cuing for proper form/technique - toe raises with cuing to focus on L LE Ambulated ~[id number] to room [facility]. Pt left seated in w/c with needs in reach and chair alarm on.  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 Restrictions Weight Bearing Restrictions: No Pain Pain Assessment Pain Scale: 0-10 Pain Score: 0-No pain Perception  Perception Perception: Within Functional Limits Praxis Praxis: Intact  Cognition Overall Cognitive Status: Within Functional Limits for tasks assessed Arousal/Alertness: Awake/alert Orientation Level: Oriented X4 Attention: Focused;Sustained Focused Attention: Appears intact Sustained Attention: Appears intact Memory: Appears intact Safety/Judgment: Appears intact Sensation Sensation Light Touch: Impaired Detail Light Touch Impaired Details: Impaired LLE Hot/Cold: Not  tested Proprioception: Impaired Detail Proprioception Impaired Details: Impaired LLE(significantly impaired with pt often needing to use visual information to determine L LE foot placement) Coordination Gross Motor Movements  are Fluid and Coordinated: No Coordination and Movement Description: Pt with hx of L UE and L LE hemiparesis from prior CVA. Pt continues to demonstrate L HB paresis with impaired coordination and proprioceptive awareness. Heel Shin Test: impaired L LE coordination with ataxia Motor  Motor Motor: Hemiplegia;Abnormal postural alignment and control Motor - Discharge Observations: Pt stil with moderate LUE and LLE hemiparesis and impaired motor coordination  Mobility Bed Mobility Bed Mobility: Supine to Sit;Sit to Supine Supine to Sit: Independent with assistive device Sit to Supine: Independent with assistive device Transfers Transfers: Sit to Stand;Stand to Sit;Stand Pivot Transfers Sit to Stand: Supervision/Verbal cueing Stand to Sit: Supervision/Verbal cueing Stand Pivot Transfers: Supervision/Verbal cueing Transfer (Assistive device): Straight cane Locomotion  Gait Ambulation: Yes Gait Assistance: Supervision/Verbal cueing Gait Distance (Feet): 300 Feet Assistive device: Straight cane Gait Gait: Yes Gait Pattern: Impaired Gait Pattern: Poor foot clearance - left;Decreased step length - left;Decreased stance time - left;Left circumduction Gait velocity: decreased Stairs / Additional Locomotion Stairs: Yes Stairs Assistance: Supervision/Verbal cueing Stair Management Technique: Two rails Number of Stairs: 12 Height of Stairs: 6 Ramp: Supervision/Verbal cueing Curb: Supervision/Verbal cueing Wheelchair Mobility Wheelchair Mobility: No  Trunk/Postural Assessment  Cervical Assessment Cervical Assessment: Exceptions to WFL(forward head/cervical protraction) Thoracic Assessment Thoracic Assessment: Exceptions to WFL(thoracic kyphosis with protracted  shoulders) Lumbar Assessment Lumbar Assessment: Exceptions to WFL(posterior pelvic tilt) Postural Control Postural Control: Deficits on evaluation Righting Reactions: adequtae for minor LOBs Postural Limitations: decreased  Balance Balance Balance Assessed: Yes Static Sitting Balance Static Sitting - Level of Assistance: 7: Independent Dynamic Sitting Balance Dynamic Sitting - Balance Support: During functional activity Dynamic Sitting - Level of Assistance: 5: Stand by assistance Static Standing Balance Static Standing - Balance Support: During functional activity;Right upper extremity supported Static Standing - Level of Assistance: 5: Stand by assistance Dynamic Standing Balance Dynamic Standing - Balance Support: During functional activity;Right upper extremity supported Dynamic Standing - Level of Assistance: 5: Stand by assistance  Balance Balance Assessed: Yes Standardized Balance Assessment Standardized Balance Assessment: Berg Balance Test Berg Balance Test Sit to Stand: Able to stand  independently using hands Standing Unsupported: Able to stand 2 minutes with supervision Sitting with Back Unsupported but Feet Supported on Floor or Stool: Able to sit safely and securely 2 minutes Stand to Sit: Sits safely with minimal use of hands Transfers: Able to transfer safely, minor use of hands Standing Unsupported with Eyes Closed: Able to stand 10 seconds safely Standing Ubsupported with Feet Together: Able to place feet together independently and stand for 1 minute with supervision From Standing, Reach Forward with Outstretched Arm: Reaches forward but needs supervision From Standing Position, Pick up Object from Floor: Able to pick up shoe, needs supervision From Standing Position, Turn to Look Behind Over each Shoulder: Turn sideways only but maintains balance(decreased rotation also limited by decreased spinal rotation mobility) Turn 360 Degrees: Able to turn 360 degrees  safely but slowly Standing Unsupported, Alternately Place Feet on Step/Stool: Able to complete 4 steps without aid or supervision Standing Unsupported, One Foot in Front: Able to take small step independently and hold 30 seconds Standing on One Leg: Able to lift leg independently and hold equal to or more than 3 seconds Total Score: 39 Extremity Assessment      RLE Assessment RLE Assessment: Exceptions to [facility] General Strength Comments: Grossly 4+/5 assessed in sitting LLE Assessment LLE Assessment: Exceptions to [facility] LLE Strength Left Hip Flexion: 4-/5 Left Knee Flexion: 4/5 Left Knee Extension: 4/5 Left Ankle  Dorsiflexion: 4-/5 Left Ankle Plantar Flexion: 3/5    Nikia Mangino M Shareta Fishbaugh, PT ,DPT 09/14/2019, 7:53 AM

## 2019-09-14 NOTE — Progress Notes (Signed)
. [place] PHYSICAL MEDICINE & REHABILITATION PROGRESS NOTE   Subjective/Complaints: Patient seen in the physical therapy gym.  She is working with OT.  She has been doing some fine motor manipulation such as buttoning different size buttons.  She does well with larger buttons but has difficulty with smaller buttons We discussed discharge planning discharge in a.m. planned.  Discussed MD follow-up   ROS: Patient denies   nausea, vomiting, diarrhea, cough, shortness of breath or chest pain,    Objective:   No results found. Recent Labs    09/12/19 0502  WBC 2.9*  HGB 12.1  HCT 37.6  PLT 173   No results for input(s): NA, K, CL, CO2, GLUCOSE, BUN, CREATININE, CALCIUM in the last 72 hours.  Intake/Output Summary (Last 24 hours) at 09/14/2019 0832 Last data filed at 09/14/2019 [id number] Gross per 24 hour  Intake 680 ml  Output -  Net 680 ml     Physical Exam: Vital Signs Blood pressure 103/69, pulse 73, temperature 97.6 F (36.4 C), temperature source Oral, resp. rate 17, height 5\' 7"  (1.702 m), weight 61 kg, SpO2 97 %.  Constitutional: No distress . Vital signs reviewed. Pt sitting on mat in gym, NAD HEENT: EOMI, oral membranes moist Neck: supple Cardiovascular: RRR without murmur. No JVD    Respiratory: CTA Bilaterally without wheezes or rales. Normal effort    GI: BS +, non-tender, non-distended  Musculoskeletal:     Comments: No edema or tenderness in extremities ; TTP over L trochanteric bursa Neurological: She is alert and oriented x 3    Good awareness of deficits. Motor: RUE: 4+/5 proximal to distal LUE: 3+ prox to 4/5 distally. Mild left PD LLE: HF, KE 3- to 3/5, ADF limited due to AFO  Skin: Skin is warm and dry. Bruises on UEs B/L from blood draws/IVs Psychiatric: pleasant, engaged in therapy.    Assessment/Plan: 1. 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  Physiatrist is providing close team supervision and 24 hour management of active medical problems listed below.  Physiatrist and rehab team continue to assess barriers to discharge/monitor patient progress toward functional and medical goals  Care Tool:  Bathing    Body parts bathed by patient: Right arm, Left arm, Chest, Abdomen, Right upper leg, Face, Left lower leg, Right lower leg, Left upper leg         Bathing assist Assist Level: Supervision/Verbal cueing     Upper Body Dressing/Undressing Upper body dressing   What is the patient wearing?: Bra, Pull over shirt    Upper body assist Assist Level: Supervision/Verbal cueing    Lower Body Dressing/Undressing Lower body dressing      What is the patient wearing?: Underwear/pull up     Lower body assist Assist for lower body dressing: Contact Guard/Touching assist     Toileting Toileting    Toileting assist Assist for toileting: Moderate Assistance - Patient 50 - 74%     Transfers Chair/bed transfer  Transfers assist     Chair/bed transfer assist level: Contact Guard/Touching assist Chair/bed transfer assistive device: Research officer, political party   Ambulation assist      Assist level: Contact Guard/Touching assist Assistive device: Cane-straight Max distance: 334ft   Walk 10 feet activity   Assist     Assist level: Contact Guard/Touching assist Assistive device: Cane-straight   Walk 50 feet activity   Assist  Assist level: Contact Guard/Touching assist Assistive device: Cane-straight    Walk 150 feet activity   Assist Walk 150 feet activity did not occur: Safety/medical concerns  Assist level: Contact Guard/Touching assist Assistive device: Cane-straight    Walk 10 feet on uneven surface  activity   Assist Walk 10 feet on uneven surfaces activity did not occur: Safety/medical concerns   Assist level: Contact Guard/Touching assist Assistive device:  Cane-straight   Wheelchair     Assist Will patient use wheelchair at discharge?: No(anticipate pt will be a functional ambulator)             Wheelchair 50 feet with 2 turns activity    Assist            Wheelchair 150 feet activity     Assist          Blood pressure 103/69, pulse 73, temperature 97.6 F (36.4 C), temperature source Oral, resp. rate 17, height 5\' 7"  (1.702 m), weight 61 kg, SpO2 97 %.  Medical Problem List and Plan: 1.  Increasing left side weakness secondary to minor acute extension along the anterior margin of the old infarct right parietal cortical and subcortical affecting the deep white matter and small focus adjacent cortical brain Tent d/c 11/7 with physical medicine rehab follow-up in 2 weeks 2.  Antithrombotics: -DVT/anticoagulation: Chronic Coumadin for MVR with INR goal 3-3.5  -INR 3.2 10/31  11/2- INR 3.0 D/C enoxaparin             -antiplatelet therapy: Aspirin 81 mg daily 3. Pain Management: Tylenol as needed  4. Mood: Provide emotional support             -antipsychotic agents: N/A 5. Neuropsych: This patient is capable of making decisions on her own behalf. 6. Skin/Wound Care: Routine skin checks 7. Fluids/Electrolytes/Nutrition: Routine in and outs.  8.  Hypertension.  Norvasc 10 mg daily Vitals:   09/13/19 1949 09/14/19 0524  BP: 106/66 103/69  Pulse: 65 73  Resp: 17 17  Temp: 97.9 F (36.6 C) 97.6 F (36.4 C)  SpO2: 97% 97%  BP controlled 11/6 9.  History of mitral valve replacement.  Continue Coumadin 10.  Hyperlipidemia.  Cont Lipitor 11. Prediabetes- Hgb A1C 6.1- diet ed has been completed, her husband is a prediabetic as well             Monitor with increased mobility 12. Thrombocytopenia resolved              platelets stable, now off enoxaparin    LOS: 9 days A FACE TO [place] E Kirsteins 09/14/2019, 8:32 AM

## 2019-09-14 NOTE — Progress Notes (Signed)
Physical Therapy Session Note  Patient Details  Name: [name] MRN: [id number] Date of Birth: [birth year]  Today's Date: 09/14/2019 PT Individual Time: 1330-1430 PT Individual Time Calculation (min): 60 min   Short Term Goals: Week 1:  PT Short Term Goal 1 (Week 1): Pt will perform supine<>sit with supervision PT Short Term Goal 2 (Week 1): Pt will perform sit<>stand transfers using LRAD with CGA PT Short Term Goal 3 (Week 1): Pt will perform stand pivot transfers using LRAD with CGA PT Short Term Goal 4 (Week 1): Pt will ambulate at least 166ft using LRAD with CGA PT Short Term Goal 5 (Week 1): Pt will step up/down 4" curb step using LRAD with min assist  Skilled Therapeutic Interventions/Progress Updates:    Session focused on NMR to address postural control and balance retraining, forced use of LUE and LLE, weightshifting and increasing weightbearing to L side, and functional use of LUE during dual task activities. Utilized Biodex with BUE support for weightshifting to target, limits of stability and maze control programs. Pt requires cues for technique and intermittent facilitation at pelvis for weightshifting to L activation. Utilized non compliant and then 1 round of compliant surface (level 10). [name] step up/down with steadying assist as seated rest breaks needed between trials. On kinteron in standing performed functional reaching and manipulation task with LUE reaching across midline and also bending down to pick up/put down items while maintaining neutral balanced stance on footplates (mirror utilized for visual feedback). Pt with LOB or decreased stability but able to correct with UE use. Stepping with kinetron in standing with LUE support for forced weightbearing x 3 min each trial x 3 reps.    Functional gait training on unit > 150' with SPC with close supervision especially during turning or dual task due to increased demand on attention to balance. Pt asked to  carry jacket in LUE for functional task.  Pt performed basic transfers at supervision level with [facility] and cues for technique occasionally especially from lower surface or without armrests (required min assist without armrests). Denies concerns in regards to upcoming d/c tomorrow.   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   Therapy/Group: Individual Therapy  Canary Brim Ivory Broad, PT, DPT, CBIS  09/14/2019, 3:07 PM

## 2019-09-15 DIAGNOSIS — I6300 Cerebral infarction due to thrombosis of unspecified precerebral artery: Secondary | ICD-10-CM

## 2019-09-15 LAB — PROTIME-INR
INR: 3.4 — ABNORMAL HIGH (ref 0.8–1.2)
Prothrombin Time: 33.9 seconds — ABNORMAL HIGH (ref 11.4–15.2)

## 2019-09-15 LAB — CBC
HCT: 38.7 % (ref 36.0–46.0)
Hemoglobin: 12.3 g/dL (ref 12.0–15.0)
MCH: 28.8 pg (ref 26.0–34.0)
MCHC: 31.8 g/dL (ref 30.0–36.0)
MCV: 90.6 fL (ref 80.0–100.0)
Platelets: 173 10*3/uL (ref 150–400)
RBC: 4.27 MIL/uL (ref 3.87–5.11)
RDW: 13.9 % (ref 11.5–15.5)
WBC: 3.6 10*3/uL — ABNORMAL LOW (ref 4.0–10.5)
nRBC: 0 % (ref 0.0–0.2)

## 2019-09-15 MED ORDER — WARFARIN SODIUM 5 MG PO TABS: 5.0000 mg | ORAL_TABLET | Freq: Once | ORAL | Status: DC

## 2019-09-15 NOTE — Plan of Care (Signed)
  Problem: Consults Goal: RH STROKE PATIENT EDUCATION Description: See Patient Education module for education specifics  Outcome: Completed/Met Goal: Nutrition Consult-if indicated Outcome: Completed/Met   Problem: RH BOWEL ELIMINATION Goal: RH STG MANAGE BOWEL WITH ASSISTANCE Description: STG Manage Bowel with supervision Assistance. Outcome: Completed/Met Goal: RH STG MANAGE BOWEL W/MEDICATION W/ASSISTANCE Description: STG Manage Bowel with Medication with supervision Assistance. Outcome: Completed/Met   Problem: RH BLADDER ELIMINATION Goal: RH STG MANAGE BLADDER WITH ASSISTANCE Description: STG Manage Bladder With supervision Assistance Outcome: Completed/Met   Problem: RH SKIN INTEGRITY Goal: RH STG SKIN FREE OF INFECTION/BREAKDOWN Description: Skin to remain free from breakdown while on rehab with min assist. Outcome: Completed/Met Goal: RH STG MAINTAIN SKIN INTEGRITY WITH ASSISTANCE Description: STG Maintain Skin Integrity With min Assistance. Outcome: Completed/Met Goal: RH STG ABLE TO PERFORM INCISION/WOUND CARE W/ASSISTANCE Description: STG Able To Perform Incision/Wound Care With min Assistance. Outcome: Completed/Met   Problem: RH SAFETY Goal: RH STG ADHERE TO SAFETY PRECAUTIONS W/ASSISTANCE/DEVICE Description: STG Adhere to Safety Precautions With supervision Assistance and appropriate assistive Device. Outcome: Completed/Met   Problem: RH PAIN MANAGEMENT Goal: RH STG PAIN MANAGED AT OR BELOW PT'S PAIN GOAL Description: <3 on a 0-10 pain scale. Outcome: Completed/Met   Problem: RH KNOWLEDGE DEFICIT Goal: RH STG INCREASE KNOWLEDGE OF HYPERTENSION Description: Patient will be able to demonstrate knowledge of HTN, medications, BP parameters, and dietary restrictions with min assist from rehab staff. Outcome: Completed/Met Goal: RH STG INCREASE KNOWLEGDE OF HYPERLIPIDEMIA Description: Patient will demonstrate knowledge of HLD medications and dietary restrictions  with min assist from rehab staff. Outcome: Completed/Met Goal: RH STG INCREASE KNOWLEDGE OF STROKE PROPHYLAXIS Description: Patient will demonstrate knowledge of stroke prevention medications with min assist from rehab staff. Outcome: Completed/Met

## 2019-09-15 NOTE — Progress Notes (Signed)
. [place] PHYSICAL MEDICINE & REHABILITATION PROGRESS NOTE   Subjective/Complaints: Received D/C instructions yesterday    ROS: Patient denies   nausea, vomiting, diarrhea, cough, shortness of breath or chest pain,    Objective:   No results found. Recent Labs    09/15/19 0510  WBC 3.6*  HGB 12.3  HCT 38.7  PLT 173   No results for input(s): NA, K, CL, CO2, GLUCOSE, BUN, CREATININE, CALCIUM in the last 72 hours.  Intake/Output Summary (Last 24 hours) at 09/15/2019 0909 Last data filed at 09/15/2019 0725 Gross per 24 hour  Intake 680 ml  Output -  Net 680 ml     Physical Exam: Vital Signs Blood pressure 117/70, pulse 66, temperature 98 F (36.7 C), temperature source Oral, resp. rate 16, height 5\' 7"  (1.702 m), weight 61 kg, SpO2 98 %.  Constitutional: No distress . Vital signs reviewed. Pt sitting on mat in gym, NAD HEENT: EOMI, oral membranes moist Neck: supple Cardiovascular: RRR without murmur. No JVD    Respiratory: CTA Bilaterally without wheezes or rales. Normal effort    GI: BS +, non-tender, non-distended  Musculoskeletal:     Comments: No edema or tenderness in extremities ; TTP over L trochanteric bursa Neurological: She is alert and oriented x 3    Good awareness of deficits. Motor: RUE: 4+/5 proximal to distal LUE: 3+ prox to 4/5 distally. Mild left PD LLE: HF, KE 3- to 3/5, ADF limited due to AFO  Skin: Skin is warm and dry. Bruises on UEs B/L from blood draws/IVs Psychiatric: pleasant, engaged in therapy.    Assessment/Plan:  1. 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  Stable for D/C today F/u PCP in 3-4 weeks F/u PM&R 2 weeks See D/C summary  See D/C instructions  Care Tool:  Bathing    Body parts bathed by patient: Right arm, Left arm, Chest, Abdomen, Right upper leg, Face, Left lower leg, Right lower leg, Left upper leg         Bathing assist Assist Level: Supervision/Verbal cueing     Upper Body  Dressing/Undressing Upper body dressing   What is the patient wearing?: Bra, Pull over shirt    Upper body assist Assist Level: Supervision/Verbal cueing    Lower Body Dressing/Undressing Lower body dressing      What is the patient wearing?: Underwear/pull up     Lower body assist Assist for lower body dressing: Contact Guard/Touching assist     Toileting Toileting    Toileting assist Assist for toileting: Moderate Assistance - Patient 50 - 74%     Transfers Chair/bed transfer  Transfers assist     Chair/bed transfer assist level: Supervision/Verbal cueing Chair/bed transfer assistive device: Research officer, political party   Ambulation assist      Assist level: Supervision/Verbal cueing Assistive device: Cane-straight Max distance: 334ft   Walk 10 feet activity   Assist     Assist level: Supervision/Verbal cueing Assistive device: Cane-straight   Walk 50 feet activity   Assist    Assist level: Supervision/Verbal cueing Assistive device: Cane-straight    Walk 150 feet activity   Assist Walk 150 feet activity did not occur: Safety/medical concerns  Assist level: Supervision/Verbal cueing Assistive device: Cane-straight    Walk 10 feet on uneven surface  activity   Assist Walk 10 feet on uneven surfaces activity did not occur: Safety/medical concerns   Assist level: Supervision/Verbal cueing(up/down ramp) Assistive device: Government social research officer Will  patient use wheelchair at discharge?: No             Wheelchair 50 feet with 2 turns activity    Assist            Wheelchair 150 feet activity     Assist          Blood pressure 117/70, pulse 66, temperature 98 F (36.7 C), temperature source Oral, resp. rate 16, height 5\' 7"  (1.702 m), weight 61 kg, SpO2 98 %.  Medical Problem List and Plan: 1.  Increasing left side weakness secondary to minor acute extension along the anterior margin of  the old infarct right parietal cortical and subcortical affecting the deep white matter and small focus adjacent cortical brain  d/c 11/7 with physical medicine rehab follow-up in 2 weeks 2.  Antithrombotics: -DVT/anticoagulation: Chronic Coumadin for MVR with INR goal 3-3.5                -antiplatelet therapy: Aspirin 81 mg daily 3. Pain Management: Tylenol as needed  4. Mood: Provide emotional support             -antipsychotic agents: N/A 5. Neuropsych: This patient is capable of making decisions on her own behalf. 6. Skin/Wound Care: Routine skin checks 7. Fluids/Electrolytes/Nutrition: Routine in and outs.  8.  Hypertension.  Norvasc 10 mg daily Vitals:   09/14/19 1911 09/15/19 0522  BP: 115/68 117/70  Pulse: 72 66  Resp: 16 16  Temp: 97.8 F (36.6 C) 98 F (36.7 C)  SpO2: 96% 98%  BP controlled 11/7 9.  History of mitral valve replacement.  Continue Coumadin 10.  Hyperlipidemia.  Cont Lipitor 11. Prediabetes- Hgb A1C 6.1- diet ed has been completed, her husband is a prediabetic as well             Monitor with increased mobility 12. Thrombocytopenia resolved              platelets stable, now off enoxaparin    LOS: 10 days A FACE TO [place] E Adileny Delon 09/15/2019, 9:09 AM

## 2019-09-15 NOTE — Progress Notes (Signed)
ANTICOAGULATION CONSULT NOTE   Pharmacy Consult for Warfarin  Indication: mechanical MVR   Patient Measurements: Height: 5\' 7"  (170.2 cm) Weight: 134 lb 7.7 oz (61 kg) IBW/kg (Calculated) : 61.6  Vital Signs: Temp: 98 F (36.7 C) (11/07 0522) Temp Source: Oral (11/07 0522) BP: 117/70 (11/07 0522) Pulse Rate: 66 (11/07 0522)  Labs: Recent Labs    09/13/19 0458 09/14/19 0531 09/15/19 0510  HGB  --   --  12.3  HCT  --   --  38.7  PLT  --   --  173  LABPROT 32.9* 37.8* 33.9*  INR 3.3* 3.9* 3.4*    Estimated Creatinine Clearance: 60.3 mL/min (by C-G formula based on SCr of 0.67 mg/dL).  Assessment: 73 year old female presented to the ED with worsening left sided weakness. She is on chronic warfarin for history of a mechanical valve. Hbg 12.3, Hct 38.7, plt 173   INR now high at 3.4 (slightly sub-therapeutic)  PTA warfarin dose: 7.5 mg q Mon/Fri, 5 mg on all other days  Goal of Therapy:  INR 3-3.5 Monitor platelets by anticoagulation protocol: Yes   Plan:  Warfarin 5 mg po x 1 tonight Daily INR  Thank you Acey Lav, PharmD  PGY1 [facility] Resident [phone]  Please utilize Amion for appropriate phone number to reach the unit pharmacist ([place])   09/15/2019 9:28 AM

## 2019-09-18 ENCOUNTER — Ambulatory Visit (INDEPENDENT_AMBULATORY_CARE_PROVIDER_SITE_OTHER): Payer: Medicare Other

## 2019-09-18 ENCOUNTER — Telehealth: Payer: Self-pay | Admitting: Registered Nurse

## 2019-09-18 DIAGNOSIS — I69359 Hemiplegia and hemiparesis following cerebral infarction affecting unspecified side: Secondary | ICD-10-CM | POA: Diagnosis not present

## 2019-09-18 DIAGNOSIS — Z7901 Long term (current) use of anticoagulants: Secondary | ICD-10-CM

## 2019-09-18 DIAGNOSIS — Z952 Presence of prosthetic heart valve: Secondary | ICD-10-CM | POA: Diagnosis not present

## 2019-09-18 DIAGNOSIS — Z5181 Encounter for therapeutic drug level monitoring: Secondary | ICD-10-CM

## 2019-09-18 LAB — POCT INR: INR: 2.4 (ref 2.0–3.0)

## 2019-09-18 NOTE — Telephone Encounter (Signed)
Placed a call to Mr. Lapole regarding [name], no answer. Left message to return the call.

## 2019-09-18 NOTE — Telephone Encounter (Signed)
  Questions for our staff to ask patients on Transitional care 48 hour phone call:   1. Are you/is patient experiencing any problems since coming home? NO     Are there any questions regarding any aspect of care? NO  2. Are there any questions regarding medications administration/dosing? NO     Are meds being taken as prescribed? YES  3. Have there been any falls? NO  4. Has Home Health been to the house and/or have they contacted you?  NA     If not, have you tried to contact them? NA     Can we help you contact them? NA  5. Are bowels and bladder emptying properly? NO     Are there any unexpected incontinence issues? NA     If applicable, is patient following bowel/bladder programs? NA  6. Any fevers, problems with breathing, unexpected pain? NO  7. Are there any skin problems or new areas of breakdown? NO  8. Has the patient/family member arranged specialty MD follow up (ie cardiology/neurology/renal/surgical/etc)? YES     Can we help arrange? NA  9. Does the patient need any other services or support that we can help arrange? NO  10. Are caregivers following through as expected in assisting the patient? YES  11. Has the patient quit smoking, drinking alcohol, or using drugs as recommended? NA  [facility] [address] [phone]

## 2019-09-18 NOTE — Patient Instructions (Signed)
Description   Spoke with pt and instructed pt to take 7.5mg  today and tomorrow, then resume same dosage 5mg  daily except 7.5mg  on Mondays and Fridays. Pt will continue greens with 2 cups of lettuce on Fridays. Pt switched from name brand Coumadin to Warfarin on 05/20/19. Does weekly checks due to insurance.

## 2019-09-19 ENCOUNTER — Ambulatory Visit: Payer: Medicare Other | Admitting: Occupational Therapy

## 2019-09-19 ENCOUNTER — Encounter: Payer: Self-pay | Admitting: Physical Therapy

## 2019-09-19 ENCOUNTER — Other Ambulatory Visit: Payer: Self-pay

## 2019-09-19 ENCOUNTER — Encounter: Payer: Self-pay | Admitting: Occupational Therapy

## 2019-09-19 ENCOUNTER — Ambulatory Visit: Payer: Medicare Other | Attending: Physical Medicine & Rehabilitation | Admitting: Physical Therapy

## 2019-09-19 DIAGNOSIS — R2681 Unsteadiness on feet: Secondary | ICD-10-CM | POA: Diagnosis not present

## 2019-09-19 DIAGNOSIS — R29818 Other symptoms and signs involving the nervous system: Secondary | ICD-10-CM | POA: Diagnosis not present

## 2019-09-19 DIAGNOSIS — I69354 Hemiplegia and hemiparesis following cerebral infarction affecting left non-dominant side: Secondary | ICD-10-CM | POA: Diagnosis not present

## 2019-09-19 DIAGNOSIS — M25612 Stiffness of left shoulder, not elsewhere classified: Secondary | ICD-10-CM

## 2019-09-19 DIAGNOSIS — R293 Abnormal posture: Secondary | ICD-10-CM | POA: Diagnosis not present

## 2019-09-19 DIAGNOSIS — R270 Ataxia, unspecified: Secondary | ICD-10-CM | POA: Diagnosis not present

## 2019-09-19 DIAGNOSIS — M6281 Muscle weakness (generalized): Secondary | ICD-10-CM

## 2019-09-19 DIAGNOSIS — R482 Apraxia: Secondary | ICD-10-CM

## 2019-09-19 DIAGNOSIS — I6389 Other cerebral infarction: Secondary | ICD-10-CM | POA: Insufficient documentation

## 2019-09-19 NOTE — Patient Instructions (Signed)
Access Code: [email or web address]  URL: [email or web address]  Date: 09/19/2019  Prepared by: Elsie Ra   Exercises  Backward Walking with Counter Support - 3-5 reps - 1 sets - 2x daily - 6x weekly  Side Stepping with Counter Support - 3-5 reps - 1 sets - 2x daily - 6x weekly  Standing Balance with Eyes Closed - 3 reps - 1 sets - 10-20 sec hold - 2x daily - 6x weekly  Standing Tandem Balance with Counter Support - 3 reps - 1 sets - 30 hold - 2x daily - 6x weekly  Standing March with Unilateral Counter Support - 10 reps - 3 sets - 2x daily - 6x weekly  Forward Step Up - 5 reps - 2-3 sets - 1x daily - 7x weekly  Sit to Stand without Arm Support - 10 reps - 1-2 sets - 2x daily - 6x weekly

## 2019-09-19 NOTE — Therapy (Signed)
[facility] [address] [place], [place], [address] Phone: [phone]   Fax:  [phone]  Physical Therapy Evaluation  Patient Details  Name: [name] MRN: [id number] Date of Birth: Jul 09, 1946 Referring Provider (PT): [name], MD   Encounter Date: 09/19/2019  PT End of Session - 09/19/19 1633    Visit Number  1    Number of Visits  24    Date for PT Re-Evaluation  12/12/19    Authorization Type  MCR    PT Start Time  1145    PT Stop Time  [id number]    PT Time Calculation (min)  50 min    Equipment Utilized During Treatment  Gait belt    Activity Tolerance  Patient tolerated treatment well    Behavior During Therapy  [facility] for tasks assessed/performed       Past Medical History:  Diagnosis Date  . Abnormality of gait 09/07/2016  . Allergy   . Diabetes mellitus without complication [facility])    Patient denies this - notes history of glucose intolerance  . Hemiparesis and alteration of sensations as late effects of stroke ([place]) 09/07/2016  . History of pneumonia 1997  . S/P MVR (mitral valve replacement)    Mechanical mitral valve replacement at age 70 (done in [place])  // echo 7/17: EF 55-60, normal wall motion, bileaflet mechanical mitral valve prosthesis functioning normally, mild LAE, mildly reduced RVSF, small pericardial effusion  . Stroke [facility]) 1997, 2013, 2015    Past Surgical History:  Procedure Laterality Date  . ABDOMINAL HYSTERECTOMY    . BRAIN SURGERY    . BUNIONECTOMY  1993  . CARDIAC VALVE REPLACEMENT  1997  . TOE SURGERY  1996    There were no vitals filed for this visit.   Subjective Assessment - 09/19/19 1153    Subjective  [name] is a 73 y.o. right-handed female with history of hypertension, prediabetes, mechanical mitral valve replacement on anticoagulation as well as low-dose aspirin, multiple CVAs with residual left-sided weakness. Per chart review lives with  spouse used a cane in the community for ambulation furniture walking within the home as well as a left AFO brace.  Husband does assist with some basic ADLs.  Presented to hospital 09/03/2019 with worsening left side weakness.  MRI showed old hemorrhagic infarct in the right parietal cortical and subcortical brain which had progressed atrophy, encephalomalacia.  Suspicion of minor acute extension along the anterior margin of the old infarction affecting the deep white matter and small focus adjacent cortical brain. She was admitted to rehab 09/05/2019 Was discharged 09/14/19. She relays she needs to work on Lt sided strength, balance, to see if she needs a new brace (has AFO on left), and stairs.    How long can you sit comfortably?  1-2 min    Diagnostic tests  MRI showed old hemorrhagic infarct in the right parietal cortical and subcortical brain which had progressed atrophy, encephalomalacia.  Suspicion of minor acute extension along the anterior margin of the old infarction affecting the deep white matter and small focus adjacent cortical brain.    Currently in Pain?  No/denies         [facility] PT Assessment - 09/19/19 0001      Assessment   Medical Diagnosis  CVA (Rt parietal corticla subcoritcal infarct)    Referring Provider (PT)  [name], MD    Onset Date/Surgical Date  09/03/19    Hand Dominance  Right    Next MD Visit  09/25/19    Prior Therapy  inpt rehab      Precautions   Precautions  Fall      Balance Screen   Has the patient fallen in the past 6 months  No      [place] residence    Living Arrangements  Spouse/significant other    Additional Comments  townhome with 12 steps but has everything available on first floor, difficulty with stairs currently      Prior Function   Level of Independence  Independent with basic ADLs      Cognition   Overall Cognitive Status  Impaired/Different from baseline    Area of Impairment  --    relays difficulty with processing     Sensation   Proprioception  Impaired by gross assessment    Additional Comments  impaired on Lt side      Coordination   Gross Motor Movements are Fluid and Coordinated  No    Fine Motor Movements are Fluid and Coordinated  No    Coordination and Movement Description  Pt with hx of L UE and L LE hemiparesis from prior CVA. Pt continues to demonstrate L Hemiparesis with impaired coordination and proprioceptive awareness.    Finger Nose Finger Test  decreased on Lt    Heel Shin Test  Decreased on Lt      ROM / Strength   AROM / PROM / Strength  AROM;Strength      AROM   Overall AROM   Deficits    Overall AROM Comments  Left shoulder deficits, left ankle DF due to weakness      Strength   Overall Strength Comments  Rt side 5/5 excep hip flexion 4/5, Lt arm grossly 4/5, Lt hip grossly 3+/5, Lt knee grossly 4/5, Left ankle grossly 3-/5 all tested in sitting      Transfers   Transfers  Stand Pivot Transfers    Five time sit to stand comments   38 sec no UE support    Stand Pivot Transfers  4: Min guard      Ambulation/Gait   Ambulation/Gait  Yes    Ambulation/Gait Assistance  4: Min guard    Ambulation Distance (Feet)  115 Feet    Assistive device  Straight cane    Gait Pattern  Decreased step length - right;Decreased step length - left;Decreased stance time - left;Decreased hip/knee flexion - left;Decreased dorsiflexion - left;Decreased weight shift to left;Ataxic;Decreased arm swing - left    Ambulation Surface  Level;Indoor    Gait velocity  20 ft in 12 sec=1.66 ft/sec    Gait Comments  AFO in Lt foot      Standardized Balance Assessment   Standardized Balance Assessment  Berg Balance Test;Timed Up and Go Test      Berg Balance Test   Sit to Stand  Able to stand without using hands and stabilize independently    Standing Unsupported  Able to stand 2 minutes with supervision    Sitting with Back Unsupported but Feet Supported on Floor or  Stool  Able to sit safely and securely 2 minutes    Stand to Sit  Controls descent by using hands    Transfers  Able to transfer with verbal cueing and /or supervision    Standing Unsupported with Eyes Closed  Able to stand 10 seconds with supervision    Standing Unsupported with Feet Together  Able to place feet together independently but unable to hold for 30 seconds    From Standing, Reach Forward with Outstretched Arm  Can reach forward >5 cm safely (2")    From Standing Position, Pick up Object from [place] to pick up shoe, needs supervision    From Standing Position, Turn to Look Behind Over each Shoulder  Looks behind one side only/other side shows less weight shift    Turn 360 Degrees  Needs close supervision or verbal cueing    Standing Unsupported, Alternately Place Feet on Step/Stool  Able to complete >2 steps/needs minimal assist    Standing Unsupported, One Foot in [place] help to step but can hold 15 seconds    Standing on One Leg  Tries to lift leg/unable to hold 3 seconds but remains standing independently    Total Score  33      Timed Up and Go Test   Normal TUG (seconds)  25                Objective measurements completed on examination: See above findings.              PT Education - 09/19/19 1633    Education Details  HEP, POC, exam findings    Person(s) Educated  Patient;Spouse    Methods  Explanation;Demonstration;Verbal cues;Handout    Comprehension  Verbalized understanding;Need further instruction       PT Short Term Goals - 09/19/19 1643      PT SHORT TERM GOAL #1   Title  Patient to be independent with initial HEP. (target for STG 4 weeks 10/19/19)    Status  New      PT SHORT TERM GOAL #2   Title  Pt will improve TUG to less than 20 sec    Baseline  25    Status  New      PT SHORT TERM GOAL #3   Title  Pt will improve BERG to more than 40    Baseline  33    Status  New        PT Long Term Goals - 09/19/19 1644       PT LONG TERM GOAL #1   Title  Patient to be independent with advanced HEP. (Target for LTG 12 weeks 12/12/19)    Time  12    Period  Weeks    Status  New      PT LONG TERM GOAL #2   Title  Pt will improve TUG to less than 17 seconds    Baseline  25    Time  12    Period  Weeks    Status  New      PT LONG TERM GOAL #3   Title  Patient to score <20 sec on 5xSTS without UE support to decrease risk of falls.    Baseline  38    Time  12    Period  Weeks    Status  New      PT LONG TERM GOAL #4   Title  Patient to improve gait speed to 2 ft/sec with LRAD    Baseline  1.66    Time  12    Period  Weeks    Status  New      PT LONG TERM GOAL #5   Title  Pt to ambulate limited community distances mod I at least 450 ft with LRAD and negotiate up/down one flight of stairs.  Time  12    Period  Weeks    Status  New      Additional Long Term Goals   Additional Long Term Goals  Yes      PT LONG TERM GOAL #6   Title  Pt will improve BERG to at least 45 to show improved balance    Baseline  33    Time  12    Period  Weeks    Status  New             Plan - 09/19/19 1634    Clinical Impression Statement  Pt presents with CVA (Rt parietal cortical subcoritcal infarct) on 09/03/19 then had inpt rehab until 09/14/19. She has had CVA's in the past. She now presents to outpaitent PT with Lt hemiparesis, decreased coordination,decreased balance, decreased endurance, decreased standing activity tolerance, and ataxic gait,and Lt foot drop She will benefit from skilled PT to address her deficits.    Personal Factors and Comorbidities  Comorbidity 3+    Examination-Activity Limitations  Locomotion Level;Transfers;Reach Overhead;Carry;Squat;Stairs;Lift;Stand    Examination-Participation Restrictions  Meal Prep;Cleaning;Community Activity;Driving;Laundry;Shop    Stability/Clinical Decision Making  Evolving/Moderate complexity    Clinical Decision Making  Moderate    Rehab Potential   Good    PT Frequency  2x / week    PT Duration  12 weeks    PT Next Visit Plan  review and update HEP PRN, needs to work on gait, balance, Lt sided strength, progress to stairs when able    PT Home Exercise Plan  Access Code: [email or web address]    Consulted and Agree with Plan of Care  Patient       Patient will benefit from skilled therapeutic intervention in order to improve the following deficits and impairments:  Abnormal gait, Decreased activity tolerance, Decreased balance, Decreased coordination, Decreased endurance, Decreased mobility, Decreased range of motion, Decreased strength, Difficulty walking, Impaired UE functional use, Postural dysfunction, Improper body mechanics  Visit Diagnosis: Unsteadiness on feet  Muscle weakness (generalized)  Cerebrovascular accident (CVA) due to other mechanism [facility])     Problem List Patient Active Problem List   Diagnosis Date Noted  . Subcortical infarction ([place]) 09/05/2019  . Anticoagulated on warfarin   . Left hemiparesis ([place])   . History of CVA with residual deficit   . Thrombocytopenia ([place])   . Prediabetes   . CVA (cerebral vascular accident) ([place]) 09/03/2019  . Vertigo 09/24/2017  . Benign paroxysmal positional vertigo   . History of stroke   . Hyperlipemia 08/23/2017  . Encounter for therapeutic drug monitoring 08/08/2017  . Long term (current) use of anticoagulants [Z79.01] 07/12/2017  . Hemiparesis and alteration of sensations as late effects of stroke ([place]) 09/07/2016  . Abnormality of gait 09/07/2016  . HTN (hypertension) 04/13/2016  . H/O mitral valve replacement with mechanical valve 10/16/2015  . Diabetes mellitus without complication ([place])   . Hemiparesis (L sided - mild) due to old stroke [facility])     Silvestre Mesi 09/19/2019, 4:49 PM  [facility] [address] [place], [place], [address] Phone: [phone]   Fax:  [phone]  Name: TYAJA STRAWDERMAN MRN: [id number] Date of Birth: 1946-05-27

## 2019-09-19 NOTE — Therapy (Signed)
[facility] [address] [place], [place], [address] Phone: [phone]   Fax:  [phone]  Occupational Therapy Evaluation  Patient Details  Name: [name] MRN: [id number] Date of Birth: 1946-03-24 Referring Provider (OT): Dr. Letta Pate   Encounter Date: 09/19/2019  OT End of Session - 09/19/19 1356    Visit Number  1    Number of Visits  16    Date for OT Re-Evaluation  11/14/19    Authorization Type  medicare will need PN every 10th visit    Authorization Time Period  90 days    Authorization - Visit Number  1    Authorization - Number of Visits  10    OT Start Time  1232    OT Stop Time  1315    OT Time Calculation (min)  43 min    Activity Tolerance  Patient tolerated treatment well       Past Medical History:  Diagnosis Date  . Abnormality of gait 09/07/2016  . Allergy   . Diabetes mellitus without complication [facility])    Patient denies this - notes history of glucose intolerance  . Hemiparesis and alteration of sensations as late effects of stroke ([place]) 09/07/2016  . History of pneumonia 1997  . S/P MVR (mitral valve replacement)    Mechanical mitral valve replacement at age 73 (done in [place])  // echo 7/17: EF 55-60, normal wall motion, bileaflet mechanical mitral valve prosthesis functioning normally, mild LAE, mildly reduced RVSF, small pericardial effusion  . Stroke [facility]) 1997, 2013, 2015    Past Surgical History:  Procedure Laterality Date  . ABDOMINAL HYSTERECTOMY    . BRAIN SURGERY    . BUNIONECTOMY  1993  . CARDIAC VALVE REPLACEMENT  1997  . TOE SURGERY  1996    There were no vitals filed for this visit.  Subjective Assessment - 09/19/19 1236    Patient is accompanied by:  Family member   husband [name]   Pertinent History  09/02/2019 R CVA ( suspect extension of old infarct affecting deep white matter and small focus adjacent to cortical brain.  PMH:  prior CVA's first in April of  '97, August of 97, fall of 2014, spring of 2015.    Patient Stated Goals  I hope my hand will get better "I want it down and I know I am suppose to swing it more naturally when I walk.  Sometimes I walk into the door frame on my left.    Currently in Pain?  No/denies        [facility] OT Assessment - 09/19/19 1240      Assessment   Medical Diagnosis  CVA (minor extension of old infarct R parietal subcortical stroke)    Referring Provider (OT)  Dr. Letta Pate    Onset Date/Surgical Date  09/02/19    Hand Dominance  Right    Next MD Visit  09/25/19    Prior Therapy  inpt rehab for PT and OT      Precautions   Precautions  Fall   pt had PT eval today     Restrictions   Weight Bearing Restrictions  No      Balance Screen   Has the patient fallen in the past 6 months  No   pt had PT eval today     Home  Environment   Family/patient expects to be discharged to:  Private residence    Living Arrangements  Spouse/significant other  Available Help at Discharge  Available 24 hours/day    Type of Home  Other (Comment)   town house 1 story   Home Layout  Two level    Bathroom Shower/Tub  --   n/a pt washes at sink and has for quite [employer]    Additional Comments  Pt has raised toilet seat with handles. Pt has grab bars coming in from garage on both sides.       Prior Function   Level of Independence  Independent with basic ADLs    Leisure  enjoy my grandkids, reading - has not had the interest to do this lately.  Making cards for shut ins at church, communicating with friends, used to do gardening       ADL   Eating/Feeding  Minimal assistance   to cut food   Grooming  Modified independent    Upper Body Bathing  Minimal assistance   husband washes back   Lower Body Bathing  Modified independent    Upper Body Dressing  Increased time    Lower Body Dressing  Increased time   husband helps with TED stockings and small buttons   Toilet Transfer  Min guard    husband uses gait belt to walk pt to bathroom   Toileting - Clothing Manipulation  Modified independent    [place] Transfer  --   n/a pt washes at sink and has for awhile   ADL comments  Cane, gait belt.  Pt has SBQC, walker, and uses night light at home.       IADL   Prior Level of Function Shopping  went with husband for short trips like [employer] or [employer]  Completely unable to shop    Prior Level of Function Light Housekeeping  Pt has help prior to this for heavier cleaning - was able to participate in light household chores including laundry and assisted husband with simple cooking.     Light Housekeeping  Does not participate in any housekeeping tasks    Meal Prep  Needs to have meals prepared and served    [employer] on family or friends for transportation   pt was driving before this last stroke   Medication Management  Is responsible for taking medication in correct dosages at correct time    Psychiatrist financial matters independently (budgets, writes checks, pays rent, bills goes to bank), collects and keeps track of income      Mobility   Mobility Status  Needs assist    Mobility Status Comments  pt and husband are using gait belt for functional ambulation in the home per inpt rehab recommendations      Written Expression   Dominant Hand  Right      Vision - History   Baseline Vision  Wears glasses all the time    Visual History  --   pt has h/o floaters   Additional Comments  Pt reports letters are not as crisp and clear,      Vision Assessment   Comment  Mild clarity issues when reading - letters are not as crisp per pt. Will further assess vision next session.       Activity Tolerance   Activity Tolerance  Tolerate 30+ min activity without fatigue      Cognition   Overall Cognitive Status  History of  cognitive impairments - at baseline    Mini Mental State  Exam   pt reports that for a long time now she processes info slower but pt and husband feel pt is at baseline.       Posture/Postural Control   Posture/Postural Control  Postural limitations    Postural Limitations  Weight shift right;Flexed trunk;Rounded Shoulders      Sensation   Light Touch  Appears Intact    Hot/Cold  Appears Intact    Proprioception  Impaired by gross assessment   both LUE and LLE     Coordination   Gross Motor Movements are Fluid and Coordinated  No   LUE dystonic posturing and ataxic   Fine Motor Movements are Fluid and Coordinated  No    Coordination and Movement Description  dystonic posturing in LUE as well as L hand; moderate ataxia.  Pt reports she had these deficits before and isn't sure if this worse or not.      Other  Pt reports she postures LUE especially when ambulating.  Husband states I think it was like that before but now they drew our attention to it and so I have to keep cueing her about it.     Coordination  9 hole peg not completed as pt is apraxic, demonstrates dystonic posturing, has increased tone and has mild L inattention - do not feel 9 hole peg will accurately reflect functional use of LUE. Will continue to assess via functional fine motor tasks.       Perception   Perception  Impaired      Praxis   Praxis  Impaired      Tone   Assessment Location  Left Upper Extremity      ROM / Strength   AROM / PROM / Strength  AROM;Strength;PROM      AROM   Overall AROM   Deficits    Overall AROM Comments  L shoulder flexion 100* and pt reports tightness in L shoulder but no pain.  Pt also postures with elbow flexion with any reaching activity      Strength   Overall Strength  Deficits    Overall Strength Comments  3+/5 proximally - pt has limited shoulder flexion due to tightness in L shoulder girdle and malalignment of L shoulder girdle limiting full shouder flexion and abduction.  Pt is unable to state whether or not she feels LUE is  weaker since last stroke.  RUE WFL's      Hand Function   Right Hand Gross Grasp  Functional    Right Hand Grip (lbs)  60    Left Hand Gross Grasp  Impaired    Left Hand Grip (lbs)  22      LUE Tone   LUE Tone  Hypertonic;Modified Ashworth      LUE Tone   Modified Ashworth Scale for Grading Hypertonia LUE  More marked increase in muscle tone through most of the ROM, but affected part(s) easily moved                        OT Short Term Goals - 09/19/19 1340      OT SHORT TERM GOAL #1   Title  Pt and husband will be mod I with HEP to address L grip strength and L coordination - 10/17/2019    Status  New      OT SHORT TERM GOAL #2   Title  Pt will be mod  I with cutting food on plate AE prn    Status  New      OT SHORT TERM GOAL #3   Title  Pt will demonstrate understanding of use of button hook for smaller buttons    Status  New      OT SHORT TERM GOAL #4   Title  Pt will be mod I with bathing back AE prn    Status  New      OT SHORT TERM GOAL #5   Title  Pt will be supervision for toilet tranfers    Status  New      Additional Short Term Goals   Additional Short Term Goals  Yes      OT SHORT TERM GOAL #6   Title  Pt will demonstrate improved grip strength by at least 4 pounds to assist with light housekeeping tasks (baseline = 22)    Status  New        OT Long Term Goals - 09/19/19 1342      OT LONG TERM GOAL #1   Title  Pt and husband will be mod I with HEP to address LUE strength/funtional use and improved activity tolerance. - 11/14/2019    Status  New      OT LONG TERM GOAL #2   Title  Pt will be mod I with toilet transfers    Status  New      OT LONG TERM GOAL #3   Title  Pt will be mod I with simple snack prep at ambulatory level    Status  New      OT LONG TERM GOAL #4   Title  Pt will be mod I with laundry    Status  New      OT LONG TERM GOAL #5   Title  Pt will require no more than supervsion for short shopping trips (i.e.  [employer], [employer])    Status  New      Long Term Additional Goals   Additional Long Term Goals  Yes      OT LONG TERM GOAL #6   Title  Pt will demonstrate improved grip strength by at least 6 pounds to assist with cold snack prep and light housekeeping (baseline = 22)    Status  New      OT LONG TERM GOAL #7   Title  Pt will demonstrate overhead reach with LUE to at least 110* of shoulder flexion with LUE to obtain light weight object in overhead cabinet    Status  New      OT LONG TERM GOAL #8   Title  Pt and husband will verbalize understanding of driving recommendations            Plan - 09/19/19 1348    Clinical Impression Statement  Pt is a 73 year old female s/p R CVA (minor R parietal extension of old infarct) on 09/02/2019. Pt was discharged home on 09/15/2019 after inpt rehab.  PMH:  multiple prior CVA's, small L posterior falx meningioma (stable), HTn, HLD, prediabetes, mechanical mitral valve placement on anticoagulation, residual L hemiparesis.  Pt presents today with the following that impacts her level of current independence:  increased L hemiplegia, increased tone of LUE, dystonic posturing of LUE, increased tone in LUE, apraxia, decreased coordination LUE, decrease shoulder ROM LUE, ataxia, impaired proprioception LUE and LLE, decreased balance, decreased activity tolerance, L inattention. Pt wil benefit from skilled OT to address these deficits  and maximize independence in basic and simple advanced ADL's as well as mobility and leisure.    OT Occupational Profile and History  Detailed Assessment- Review of Records and additional review of physical, cognitive, psychosocial history related to current functional performance    Occupational performance deficits (Please refer to evaluation for details):  ADL's;IADL's;Leisure;Social Participation    Body Structure / Function / Physical Skills  ADL;Balance;Coordination;Decreased knowledge of use of  DME;Dexterity;IADL;GMC;FMC;Endurance;Mobility;Proprioception;ROM;Sensation;Strength;Tone;UE functional use    Rehab Potential  Good    Clinical Decision Making  Several treatment options, min-mod task modification necessary    Comorbidities Affecting Occupational Performance:  Presence of comorbidities impacting occupational performance    Comorbidities impacting occupational performance description:  see above PMH    Modification or Assistance to Complete Evaluation   Min-Moderate modification of tasks or assist with assess necessary to complete eval    OT Frequency  2x / week    OT Duration  8 weeks    OT Treatment/Interventions  Self-care/ADL training;Aquatic Therapy;Moist Heat;Therapeutic exercise;Neuromuscular education;DME and/or AE instruction;Passive range of motion;Manual Therapy;Therapist, nutritional;Therapeutic activities;Visual/perceptual remediation/compensation;Patient/family education;Balance training    Plan  review OT goals and POC, initiate HEP for grip and coordination as able, manual therapy to address LUE ROM, NMR for LUE functional use, mobility, postural alignment and control and balance.    Consulted and Agree with Plan of Care  Patient;Family member/caregiver    Family Member Consulted  husband       Patient will benefit from skilled therapeutic intervention in order to improve the following deficits and impairments:   Body Structure / Function / Physical Skills: ADL, Balance, Coordination, Decreased knowledge of use of DME, Dexterity, IADL, GMC, FMC, Endurance, Mobility, Proprioception, ROM, Sensation, Strength, Tone, UE functional use       Visit Diagnosis: Hemiplegia and hemiparesis following cerebral infarction affecting left non-dominant side (HCC) - Plan: Ot plan of care cert/re-cert  Apraxia - Plan: Ot plan of care cert/re-cert  Ataxia - Plan: Ot plan of care cert/re-cert  Abnormal posture - Plan: Ot plan of care cert/re-cert  Unsteadiness on feet  - Plan: Ot plan of care cert/re-cert  Other symptoms and signs involving the nervous system - Plan: Ot plan of care cert/re-cert  Stiffness of left shoulder joint - Plan: Ot plan of care cert/re-cert    Problem List Patient Active Problem List   Diagnosis Date Noted  . Subcortical infarction ([place]) 09/05/2019  . Anticoagulated on warfarin   . Left hemiparesis ([place])   . History of CVA with residual deficit   . Thrombocytopenia ([place])   . Prediabetes   . CVA (cerebral vascular accident) ([place]) 09/03/2019  . Vertigo 09/24/2017  . Benign paroxysmal positional vertigo   . History of stroke   . Hyperlipemia 08/23/2017  . Encounter for therapeutic drug monitoring 08/08/2017  . Long term (current) use of anticoagulants [Z79.01] 07/12/2017  . Hemiparesis and alteration of sensations as late effects of stroke ([place]) 09/07/2016  . Abnormality of gait 09/07/2016  . HTN (hypertension) 04/13/2016  . H/O mitral valve replacement with mechanical valve 10/16/2015  . Diabetes mellitus without complication ([place])   . Hemiparesis (L sided - mild) due to old stroke [facility])     Quay Burow, OTR/L 09/19/2019, 2:01 PM  [place] [address] [place], [place], [address] Phone: [phone]   Fax:  [phone]  Name: LALIA JOHNSTON MRN: [id number] Date of Birth: June 03, 1946

## 2019-09-24 ENCOUNTER — Ambulatory Visit (INDEPENDENT_AMBULATORY_CARE_PROVIDER_SITE_OTHER): Payer: Medicare Other | Admitting: Internal Medicine

## 2019-09-24 DIAGNOSIS — Z7901 Long term (current) use of anticoagulants: Secondary | ICD-10-CM | POA: Diagnosis not present

## 2019-09-24 DIAGNOSIS — I69359 Hemiplegia and hemiparesis following cerebral infarction affecting unspecified side: Secondary | ICD-10-CM | POA: Diagnosis not present

## 2019-09-24 DIAGNOSIS — Z952 Presence of prosthetic heart valve: Secondary | ICD-10-CM

## 2019-09-24 DIAGNOSIS — Z5181 Encounter for therapeutic drug level monitoring: Secondary | ICD-10-CM | POA: Diagnosis not present

## 2019-09-24 LAB — POCT INR: INR: 3.6 — AB (ref 2.0–3.0)

## 2019-09-24 NOTE — Patient Instructions (Signed)
Description   Spoke with pt and instructed pt to take 5mg  today, then resume same dosage 5mg  daily except 7.5mg  on Mondays and Fridays. Pt will continue greens with 2 cups of lettuce on Fridays. Pt switched from name brand Coumadin to Warfarin on 05/20/19. Does weekly checks due to insurance.

## 2019-09-25 ENCOUNTER — Encounter: Payer: Self-pay | Admitting: Registered Nurse

## 2019-09-25 ENCOUNTER — Other Ambulatory Visit: Payer: Self-pay

## 2019-09-25 ENCOUNTER — Telehealth: Payer: Self-pay | Admitting: Cardiology

## 2019-09-25 ENCOUNTER — Encounter: Payer: Medicare Other | Attending: Registered Nurse | Admitting: Registered Nurse

## 2019-09-25 VITALS — BP 111/72 | HR 83 | Temp 97.7°F | Ht 68.0 in | Wt 128.0 lb

## 2019-09-25 DIAGNOSIS — I10 Essential (primary) hypertension: Secondary | ICD-10-CM | POA: Diagnosis not present

## 2019-09-25 DIAGNOSIS — I6930 Unspecified sequelae of cerebral infarction: Secondary | ICD-10-CM | POA: Diagnosis not present

## 2019-09-25 DIAGNOSIS — G8194 Hemiplegia, unspecified affecting left nondominant side: Secondary | ICD-10-CM | POA: Insufficient documentation

## 2019-09-25 DIAGNOSIS — I639 Cerebral infarction, unspecified: Secondary | ICD-10-CM | POA: Diagnosis not present

## 2019-09-25 NOTE — Patient Instructions (Signed)
Call You're Primary Care Doctor to [facility] Follow Up Appointment   Call Dr. Acie Fredrickson office to Schedule Appointment: Gwenlyn Saran Cardiologist: [phone]

## 2019-09-25 NOTE — Telephone Encounter (Signed)
Dr. Acie Fredrickson, pt was admitted to [facility] between the dates of 10/25 to 11/6. Zella Ball from [facility] wanted to make you aware of this, so you could review notes in [place].

## 2019-09-25 NOTE — Progress Notes (Signed)
Subjective:    Patient ID: [name], female    DOB: 10-10-1946, 73 y.o.   MRN: [id number]  HPI: [name] is a 73 y.o. female who is here for transitional care visit in follow up of her subcortical infarction, left hemiparesis, history of CVA and essential hypertension. She presented to [facility] with worsening left sided weakness. Neurology was consulted.  CT Head WO Contrast:  IMPRESSION: Chronic changes without acute abnormality.   MRI BRain WO Contrast:  IMPRESSION: Old hemorrhagic infarction in the right parietal cortical and subcortical brain which has progressed to atrophy, encephalomalacia and gliosis. Suspicion of a minor acute extension along the anterior margin of the old infarction affecting the deep white matter and a small focus adjacent cortical brain.  Extensive old ischemic changes elsewhere throughout the brain as outlined above.  Tiny stable meningioma along the left posterior falx without mass-effect upon the brain or apparent change since 2018. Neurology recommendation to remained on low dose aspirin and coumadin.  [name] was admitted to inpatient rehabilitation on 09/05/2019 and discharged home on 09/15/2019. She is going to outpatient therapy at [facility]. She denies pain at this time. She rated her pain on flow-sheet  2. Also reports she has a good appetite.   Husband in room all questions answered.   Pain Inventory Average Pain 3 Pain Right Now 2 My pain is aching  In the last 24 hours, has pain interfered with the following? General activity 0 Relation with others 0 Enjoyment of life 0 What TIME of day is your pain at its worst? morning Sleep (in general) Fair  Pain is worse with: bending and standing Pain improves with: medication Relief from Meds: 8  Mobility walk with assistance use a cane ability to climb steps?  yes Do you have any goals in this area?  yes  Function retired I need assistance  with the following:  feeding  Neuro/Psych weakness tremor  Prior Studies transition  Physicians involved in your care transition   Family History  Problem Relation Age of Onset  . Cancer Mother        Bone  . Heart disease Mother   . Hyperlipidemia Mother   . Hypertension Mother   . Stroke Father   . Hypertension Father   . Heart attack Neg Hx    Social History   Socioeconomic History  . Marital status: Married    Spouse name: Not on file  . Number of children: 2  . 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 Highest education level: Not on file  Occupational History  . Occupation: Retired  Scientific laboratory technician  . Financial resource strain: Not on file  . Food insecurity    Worry: Not on file    Inability: Not on file  . Transportation needs    Medical: Not on file    Non-medical: Not on file  Tobacco Use  . Smoking status: Never Smoker  . Smokeless tobacco: Never Used  Substance and Sexual Activity  . Alcohol use: No    Alcohol/week: 0.0 standard drinks  . Drug use: No  . Sexual activity: Not on file    Comment: Married  Lifestyle  . Physical activity    Days per week: Not on file    Minutes per session: Not on file  . Stress: Not on file  Relationships  . Social Herbalist on phone: Not on file    Gets together:  Not on file    Attends religious service: Not on file    Active member of club or organization: Not on file    Attends meetings of clubs or organizations: Not on file    Relationship status: Not on file  Other Topics Concern  . Not on file  Social History Narrative   Lives at home w/ her husband   Right-handed   Caffeine: none   Past Surgical History:  Procedure Laterality Date  . ABDOMINAL HYSTERECTOMY    . BRAIN SURGERY    . BUNIONECTOMY  1993  . CARDIAC VALVE REPLACEMENT  1997  . TOE SURGERY  1996   Past Medical History:  Diagnosis Date  . Abnormality of gait 09/07/2016  . Allergy   . Diabetes mellitus without  complication [facility])    Patient denies this - notes history of glucose intolerance  . Hemiparesis and alteration of sensations as late effects of stroke ([place]) 09/07/2016  . History of pneumonia 1997  . S/P MVR (mitral valve replacement)    Mechanical mitral valve replacement at age 76 (done in [place])  // echo 7/17: EF 55-60, normal wall motion, bileaflet mechanical mitral valve prosthesis functioning normally, mild LAE, mildly reduced RVSF, small pericardial effusion  . Stroke ([place]) 1997, 2013, 2015   BP 111/72   Pulse 83   Temp 97.7 F (36.5 C)   Ht 5\' 8"  (1.727 m)   Wt 128 lb (58.1 kg)   SpO2 95%   BMI 19.46 kg/m   Opioid Risk Score:   Fall Risk Score:  `1  Depression screen PHQ 2/9  Depression screen [facility] 2/9 09/25/2019 07/03/2015  Decreased Interest 0 0  Down, Depressed, Hopeless 0 0  PHQ - 2 Score 0 0    Review of Systems  Constitutional: Negative.   HENT: Negative.   Eyes: Negative.   Respiratory: Negative.   Cardiovascular: Positive for leg swelling.  Gastrointestinal: Negative.   Endocrine: Negative.   Genitourinary: Negative.   Musculoskeletal: Negative.   Allergic/Immunologic: Negative.   Neurological: Positive for tremors and weakness.  Hematological: Bruises/bleeds easily.  Psychiatric/Behavioral: Negative.   All other systems reviewed and are negative.      Objective:   Physical Exam Vitals signs and nursing note reviewed.  Constitutional:      Appearance: Normal appearance.  Neck:     Musculoskeletal: Normal range of motion and neck supple.  Cardiovascular:     Rate and Rhythm: Normal rate and regular rhythm.     Pulses: Normal pulses.     Heart sounds: Normal heart sounds.  Pulmonary:     Effort: Pulmonary effort is normal.     Breath sounds: Normal breath sounds.  Musculoskeletal:     Comments: Normal Muscle Bulk and Muscle Testing Reveals:  Upper Extremities: Right: Full ROM and Muscle Strength 5/5 Left: Decreased ROM 45 Degrees and  Muscle Strength 4/5 Lower Extremities: Right: Full ROM and Muscle Strength 5/5 Left: Full ROM and Muscle Strength 5/5 wearing Left AFO Arises from Table slowly using walker for support Antalgic Gait   Skin:    General: Skin is warm and dry.  Neurological:     Mental Status: She is alert and oriented to person, place, and time.  Psychiatric:        Mood and Affect: Mood normal.        Behavior: Behavior normal.           Assessment & Plan:  1Subcortical infarction/left hemiparesis, history of CVA: Continue outpatient  therapy with Neuro Rehabilitation  2. Essential hypertension: Continue current medication regimen. PCP Following.   20 minutes of face to face patient care time was spent during this visit. All questions were encouraged and answered.  F/U in 4- 6 weeks

## 2019-09-25 NOTE — Telephone Encounter (Signed)
New Message:     Pt was in the hospital from 09-02-19 thru 09-14-19. [name] from [facility] told her to be sure Dr Acie Fredrickson received these records

## 2019-09-26 ENCOUNTER — Ambulatory Visit: Payer: Medicare Other | Admitting: Physical Therapy

## 2019-09-26 ENCOUNTER — Other Ambulatory Visit: Payer: Self-pay

## 2019-09-26 ENCOUNTER — Encounter: Payer: Self-pay | Admitting: Physical Therapy

## 2019-09-26 DIAGNOSIS — I6389 Other cerebral infarction: Secondary | ICD-10-CM

## 2019-09-26 DIAGNOSIS — M6281 Muscle weakness (generalized): Secondary | ICD-10-CM | POA: Diagnosis not present

## 2019-09-26 DIAGNOSIS — R2681 Unsteadiness on feet: Secondary | ICD-10-CM

## 2019-09-26 DIAGNOSIS — I69354 Hemiplegia and hemiparesis following cerebral infarction affecting left non-dominant side: Secondary | ICD-10-CM | POA: Diagnosis not present

## 2019-09-26 DIAGNOSIS — R482 Apraxia: Secondary | ICD-10-CM | POA: Diagnosis not present

## 2019-09-26 DIAGNOSIS — R270 Ataxia, unspecified: Secondary | ICD-10-CM | POA: Diagnosis not present

## 2019-09-26 NOTE — Therapy (Signed)
[facility] [address] [place], [place], [address] Phone: [phone]   Fax:  [phone]  Physical Therapy Treatment  Patient Details  Name: [name] MRN: [id number] Date of Birth: 1946/02/05 Referring Provider (PT): [name], MD   Encounter Date: 09/26/2019  PT End of Session - 09/26/19 1021    Visit Number  2    Number of Visits  24    Date for PT Re-Evaluation  12/12/19    Authorization Type  MCR    PT Start Time  1017    PT Stop Time  1100    PT Time Calculation (min)  43 min    Equipment Utilized During Treatment  Gait belt    Activity Tolerance  Patient tolerated treatment well    Behavior During Therapy  [facility] for tasks assessed/performed       Past Medical History:  Diagnosis Date  . Abnormality of gait 09/07/2016  . Allergy   . Diabetes mellitus without complication [facility])    Patient denies this - notes history of glucose intolerance  . Hemiparesis and alteration of sensations as late effects of stroke ([place]) 09/07/2016  . History of pneumonia 1997  . S/P MVR (mitral valve replacement)    Mechanical mitral valve replacement at age 89 (done in [place])  // echo 7/17: EF 55-60, normal wall motion, bileaflet mechanical mitral valve prosthesis functioning normally, mild LAE, mildly reduced RVSF, small pericardial effusion  . Stroke [facility]) 1997, 2013, 2015    Past Surgical History:  Procedure Laterality Date  . ABDOMINAL HYSTERECTOMY    . BRAIN SURGERY    . BUNIONECTOMY  1993  . CARDIAC VALVE REPLACEMENT  1997  . TOE SURGERY  1996    There were no vitals filed for this visit.  Subjective Assessment - 09/26/19 1019    Subjective  No new complaitns. No falls or pain to report (did have some pain down both lateral thighs that has resolved, thinks it was her scoliosis). Does report walking more at home. States that she is having issues with some of the ex's on her HEP.    How long can  you sit comfortably?  1-2 min    Diagnostic tests  MRI showed old hemorrhagic infarct in the right parietal cortical and subcortical brain which had progressed atrophy, encephalomalacia.  Suspicion of minor acute extension along the anterior margin of the old infarction affecting the deep white matter and small focus adjacent cortical brain.    Currently in Pain?  No/denies    Pain Score  0-No pain           [facility] Adult PT Treatment/Exercise - 09/26/19 1053      Transfers   Transfers  Sit to Stand;Stand to Sit    Sit to Stand  5: Supervision;With upper extremity assist;From chair/3-in-1;From bed    Stand to Sit  4: Min guard;With upper extremity assist;To bed;To chair/3-in-1      Ambulation/Gait   Ambulation/Gait  Yes    Ambulation/Gait Assistance  4: Min guard    Ambulation/Gait Assistance Details  pt noted to be catching her left foot with swing phase of gait on walking from lobby to gym; added simulated toe cap to left shoe for gait in session with improved advancement noted.      Ambulation Distance (Feet)  --   around gym with session   Assistive device  Straight cane      Neuro Re-ed    Neuro  Re-ed Details   for strengthening/muscle re-ed: seated with right foot on 2 inch box for sit<>stands with emphasis on increased anterior weight shifting, full standing and for slow, controlled descent with sitting.       Exercises   Exercises  Other Exercises    Other Exercises   reviewed with pt performing all current ex's on HEP issued at last session. min guard assist for balance. cues for correct form and technique needed. issued new written copy of HEP today with more detailed instructions to assist with carryover at home.              PT Education - 09/26/19 2116    Education Details  HEP- reviewed and reissued with more detailed instructions.    Person(s) Educated  Patient;Spouse    Methods  Explanation;Demonstration;Verbal cues;Handout;Tactile cues    Comprehension   Verbalized understanding;Returned demonstration;Tactile cues required;Need further instruction       PT Short Term Goals - 09/19/19 1643      PT SHORT TERM GOAL #1   Title  Patient to be independent with initial HEP. (target for STG 4 weeks 10/19/19)    Status  New      PT SHORT TERM GOAL #2   Title  Pt will improve TUG to less than 20 sec    Baseline  25    Status  New      PT SHORT TERM GOAL #3   Title  Pt will improve BERG to more than 40    Baseline  33    Status  New        PT Long Term Goals - 09/19/19 1644      PT LONG TERM GOAL #1   Title  Patient to be independent with advanced HEP. (Target for LTG 12 weeks 12/12/19)    Time  12    Period  Weeks    Status  New      PT LONG TERM GOAL #2   Title  Pt will improve TUG to less than 17 seconds    Baseline  25    Time  12    Period  Weeks    Status  New      PT LONG TERM GOAL #3   Title  Patient to score <20 sec on 5xSTS without UE support to decrease risk of falls.    Baseline  38    Time  12    Period  Weeks    Status  New      PT LONG TERM GOAL #4   Title  Patient to improve gait speed to 2 ft/sec with LRAD    Baseline  1.66    Time  12    Period  Weeks    Status  New      PT LONG TERM GOAL #5   Title  Pt to ambulate limited community distances mod I at least 450 ft with LRAD and negotiate up/down one flight of stairs.    Time  12    Period  Weeks    Status  New      Additional Long Term Goals   Additional Long Term Goals  Yes      PT LONG TERM GOAL #6   Title  Pt will improve BERG to at least 45 to show improved balance    Baseline  33    Time  12    Period  Weeks    Status  New  Plan - 09/26/19 1021    Clinical Impression Statement  Today's skilled session continued to focus on gait with cane. Pt noted to catch her foot with swing phase. Added a simulated toe cap with improved foot clearance with gait noted. Remainder of session focused on review of HEP issued at evaluation.  Pt performed each one with cues on form and technique. More detailed instrucions added to assist with clarity of HEP at home. The pt is progressing toward goals and should benefit from continued PT to progress toward unmet goals.    Personal Factors and Comorbidities  Comorbidity 3+    Examination-Activity Limitations  Locomotion Level;Transfers;Reach Overhead;Carry;Squat;Stairs;Lift;Stand    Examination-Participation Restrictions  Meal Prep;Cleaning;Community Activity;Driving;Laundry;Shop    Stability/Clinical Decision Making  Evolving/Moderate complexity    Rehab Potential  Good    PT Frequency  2x / week    PT Duration  12 weeks    PT Next Visit Plan  needs to work on gait, balance, Lt sided strength, progress to stairs when able    PT Home Exercise Plan  Access Code: [email or web address]    Consulted and Agree with Plan of Care  Patient       Patient will benefit from skilled therapeutic intervention in order to improve the following deficits and impairments:  Abnormal gait, Decreased activity tolerance, Decreased balance, Decreased coordination, Decreased endurance, Decreased mobility, Decreased range of motion, Decreased strength, Difficulty walking, Impaired UE functional use, Postural dysfunction, Improper body mechanics  Visit Diagnosis: Unsteadiness on feet  Muscle weakness (generalized)  Cerebrovascular accident (CVA) due to other mechanism [facility])     Problem List Patient Active Problem List   Diagnosis Date Noted  . 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 Anticoagulated on warfarin   . Left hemiparesis ([place])   . History of CVA with residual deficit   . Thrombocytopenia ([place])   . Prediabetes   . CVA (cerebral vascular accident) ([place]) 09/03/2019  . Vertigo 09/24/2017  . Benign paroxysmal positional vertigo   . History of stroke   . Hyperlipemia 08/23/2017  . Encounter for therapeutic drug monitoring 08/08/2017  . Long term (current) use of anticoagulants [Z79.01] 07/12/2017   . Hemiparesis and alteration of sensations as late effects of stroke ([place]) 09/07/2016  . Abnormality of gait 09/07/2016  . HTN (hypertension) 04/13/2016  . H/O mitral valve replacement with mechanical valve 10/16/2015  . Diabetes mellitus without complication ([place])   . Hemiparesis (L sided - mild) due to old stroke ([place])     Willow Ora, PTA, [facility] [address] [place], [place] [address] [phone] 09/27/19, 1:08 PM   Name: [name] MRN: [id number] Date of Birth: July 23, 1946

## 2019-10-01 ENCOUNTER — Ambulatory Visit (INDEPENDENT_AMBULATORY_CARE_PROVIDER_SITE_OTHER): Payer: Medicare Other | Admitting: Internal Medicine

## 2019-10-01 DIAGNOSIS — Z7901 Long term (current) use of anticoagulants: Secondary | ICD-10-CM

## 2019-10-01 DIAGNOSIS — Z952 Presence of prosthetic heart valve: Secondary | ICD-10-CM | POA: Diagnosis not present

## 2019-10-01 DIAGNOSIS — Z5181 Encounter for therapeutic drug level monitoring: Secondary | ICD-10-CM | POA: Diagnosis not present

## 2019-10-01 DIAGNOSIS — I69359 Hemiplegia and hemiparesis following cerebral infarction affecting unspecified side: Secondary | ICD-10-CM

## 2019-10-01 LAB — POCT INR: INR: 3.1 — AB (ref 2.0–3.0)

## 2019-10-01 NOTE — Patient Instructions (Signed)
Description   Spoke with pt and instructed pt to continue taking 5mg  daily except 7.5mg  on Mondays and Fridays. Pt will continue greens with 2 cups of lettuce on Fridays. Pt switched from name brand Coumadin to Warfarin on 05/20/19. Does weekly checks due to insurance.

## 2019-10-08 ENCOUNTER — Ambulatory Visit (INDEPENDENT_AMBULATORY_CARE_PROVIDER_SITE_OTHER): Payer: Medicare Other | Admitting: *Deleted

## 2019-10-08 DIAGNOSIS — Z5181 Encounter for therapeutic drug level monitoring: Secondary | ICD-10-CM

## 2019-10-08 DIAGNOSIS — Z952 Presence of prosthetic heart valve: Secondary | ICD-10-CM | POA: Diagnosis not present

## 2019-10-08 DIAGNOSIS — I69359 Hemiplegia and hemiparesis following cerebral infarction affecting unspecified side: Secondary | ICD-10-CM | POA: Diagnosis not present

## 2019-10-08 DIAGNOSIS — Z7901 Long term (current) use of anticoagulants: Secondary | ICD-10-CM

## 2019-10-08 LAB — POCT INR: INR: 3.5 — AB (ref 2.0–3.0)

## 2019-10-08 NOTE — Patient Instructions (Signed)
Description   Spoke with pt and instructed pt to continue taking 5mg  daily except 7.5mg  on Mondays and Fridays. Pt will continue greens with 2 cups of lettuce on Fridays. Pt switched from name brand Coumadin to Warfarin on 05/20/19. Does weekly checks due to insurance.

## 2019-10-10 ENCOUNTER — Ambulatory Visit: Payer: Medicare Other | Admitting: Occupational Therapy

## 2019-10-10 ENCOUNTER — Other Ambulatory Visit: Payer: Self-pay

## 2019-10-10 ENCOUNTER — Ambulatory Visit: Payer: Medicare Other | Attending: Physical Medicine & Rehabilitation | Admitting: Physical Therapy

## 2019-10-10 ENCOUNTER — Encounter: Payer: Self-pay | Admitting: Occupational Therapy

## 2019-10-10 ENCOUNTER — Encounter: Payer: Self-pay | Admitting: Physical Therapy

## 2019-10-10 DIAGNOSIS — M25552 Pain in left hip: Secondary | ICD-10-CM | POA: Insufficient documentation

## 2019-10-10 DIAGNOSIS — G8194 Hemiplegia, unspecified affecting left nondominant side: Secondary | ICD-10-CM | POA: Diagnosis not present

## 2019-10-10 DIAGNOSIS — M25612 Stiffness of left shoulder, not elsewhere classified: Secondary | ICD-10-CM | POA: Insufficient documentation

## 2019-10-10 DIAGNOSIS — R2681 Unsteadiness on feet: Secondary | ICD-10-CM

## 2019-10-10 DIAGNOSIS — R29818 Other symptoms and signs involving the nervous system: Secondary | ICD-10-CM

## 2019-10-10 DIAGNOSIS — R809 Proteinuria, unspecified: Secondary | ICD-10-CM | POA: Diagnosis not present

## 2019-10-10 DIAGNOSIS — R482 Apraxia: Secondary | ICD-10-CM

## 2019-10-10 DIAGNOSIS — I69354 Hemiplegia and hemiparesis following cerebral infarction affecting left non-dominant side: Secondary | ICD-10-CM | POA: Insufficient documentation

## 2019-10-10 DIAGNOSIS — R270 Ataxia, unspecified: Secondary | ICD-10-CM | POA: Diagnosis present

## 2019-10-10 DIAGNOSIS — I679 Cerebrovascular disease, unspecified: Secondary | ICD-10-CM | POA: Diagnosis not present

## 2019-10-10 DIAGNOSIS — I6389 Other cerebral infarction: Secondary | ICD-10-CM

## 2019-10-10 DIAGNOSIS — R293 Abnormal posture: Secondary | ICD-10-CM | POA: Diagnosis present

## 2019-10-10 DIAGNOSIS — R7301 Impaired fasting glucose: Secondary | ICD-10-CM | POA: Diagnosis not present

## 2019-10-10 DIAGNOSIS — N182 Chronic kidney disease, stage 2 (mild): Secondary | ICD-10-CM | POA: Diagnosis not present

## 2019-10-10 DIAGNOSIS — Z7901 Long term (current) use of anticoagulants: Secondary | ICD-10-CM | POA: Diagnosis not present

## 2019-10-10 DIAGNOSIS — Z952 Presence of prosthetic heart valve: Secondary | ICD-10-CM | POA: Diagnosis not present

## 2019-10-10 DIAGNOSIS — E7800 Pure hypercholesterolemia, unspecified: Secondary | ICD-10-CM | POA: Diagnosis not present

## 2019-10-10 DIAGNOSIS — I129 Hypertensive chronic kidney disease with stage 1 through stage 4 chronic kidney disease, or unspecified chronic kidney disease: Secondary | ICD-10-CM | POA: Diagnosis not present

## 2019-10-10 DIAGNOSIS — M6281 Muscle weakness (generalized): Secondary | ICD-10-CM | POA: Diagnosis present

## 2019-10-10 DIAGNOSIS — I69998 Other sequelae following unspecified cerebrovascular disease: Secondary | ICD-10-CM | POA: Diagnosis not present

## 2019-10-10 NOTE — Therapy (Signed)
[facility] [address] [place], [place], [address] Phone: [phone]   Fax:  [phone]  Physical Therapy Treatment  Patient Details  Name: [name] MRN: [id number] Date of Birth: 05-25-1946 Referring Provider (PT): [name], MD   Encounter Date: 10/10/2019  PT End of Session - 10/10/19 0720    Visit Number  3    Number of Visits  24    Date for PT Re-Evaluation  12/12/19    Authorization Type  MCR    PT Start Time  0717    PT Stop Time  0800    PT Time Calculation (min)  43 min    Equipment Utilized During Treatment  Gait belt    Activity Tolerance  Patient tolerated treatment well    Behavior During Therapy  [facility] for tasks assessed/performed       Past Medical History:  Diagnosis Date  . Abnormality of gait 09/07/2016  . Allergy   . Diabetes mellitus without complication [facility])    Patient denies this - notes history of glucose intolerance  . Hemiparesis and alteration of sensations as late effects of stroke ([place]) 09/07/2016  . History of pneumonia 1997  . S/P MVR (mitral valve replacement)    Mechanical mitral valve replacement at age 84 (done in [place])  // echo 7/17: EF 55-60, normal wall motion, bileaflet mechanical mitral valve prosthesis functioning normally, mild LAE, mildly reduced RVSF, small pericardial effusion  . Stroke [facility]) 1997, 2013, 2015    Past Surgical History:  Procedure Laterality Date  . ABDOMINAL HYSTERECTOMY    . BRAIN SURGERY    . BUNIONECTOMY  1993  . CARDIAC VALVE REPLACEMENT  1997  . TOE SURGERY  1996    There were no vitals filed for this visit.  Subjective Assessment - 10/10/19 0719    Subjective  No falls. Still having issues with HEP.    How long can you sit comfortably?  1-2 min    Diagnostic tests  MRI showed old hemorrhagic infarct in the right parietal cortical and subcortical brain which had progressed atrophy, encephalomalacia.  Suspicion of  minor acute extension along the anterior margin of the old infarction affecting the deep white matter and small focus adjacent cortical brain.    Currently in Pain?  Yes    Pain Score  2     Pain Location  Hip    Pain Orientation  Left    Pain Descriptors / Indicators  Aching;Sharp;Sore    Pain Type  Chronic pain    Pain Radiating Towards  thigh    Pain Onset  More than a month ago    Pain Frequency  Intermittent    Aggravating Factors   Sciatica pain    Pain Relieving Factors  De Nurse tylenol           [facility] Adult PT Treatment/Exercise - 10/10/19 0727      Transfers   Transfers  Sit to Stand;Stand to Sit    Sit to Stand  5: Supervision;With upper extremity assist;From chair/3-in-1;From bed;4: Min guard;4: Min assist;Without upper extremity assist    Stand to Sit  4: Min guard;With upper extremity assist;To bed;To chair/3-in-1;Without upper extremity assist    Number of Reps  1 set;10 reps    Comments  with chair in front as target for pt to reach over to faciliate increased anterior weight shifting.       Ambulation/Gait   Ambulation/Gait  Yes    Ambulation/Gait  Assistance  4: Min guard    Ambulation/Gait Assistance Details  cues for relaxation of left UE, for increased step length, increased stance time on left LE. no scuffing noted of left foot this session.     Ambulation Distance (Feet)  115 Feet   x1   Assistive device  Straight cane    Gait Pattern  Decreased step length - right;Decreased step length - left;Decreased stance time - left;Decreased hip/knee flexion - left;Decreased dorsiflexion - left;Decreased weight shift to left;Ataxic;Decreased arm swing - left    Ambulation Surface  Level;Indoor          Balance Exercises - 10/10/19 0731      Balance Exercises: Standing   Standing Eyes Opened  Wide (BOA);Head turns;Foam/compliant surface;Other reps (comment);Limitations    Standing Eyes Closed  Wide (BOA);Foam/compliant surface;30 secs;Limitations    SLS with  Vectors  Solid surface;Upper extremity assist 1;Other reps (comment);Limitations    Other Standing Exercises  sit<>stands to emphasize/facilitation increased left LE weight bearing/use with transfers- with right foot on 2 inch box, then with right foot on foam bubble, min to mod assist for balance, weight shifing and for full upright standing.                     Balance Exercises: Standing   Standing Eyes Opened Limitations  on 1 inch foam no UE support: EO for head movements left<>right, then up<>down for 10 reps each, min guard to min assist with cues on posture/weight shifing.     Standing Eyes Closed Limitations  on 1 inch foam with feet hip width apart: EC no head movements 30 sec's for 3 reps, min assist to min guard assist with cues to shift weight forward to improve balance/posture;     SLS with Vectors Limitations  2 foam bubbles on floor: alternating fwd foot taps, then alternating cross foot taps for 10 reps each side. no UE support with up to mod assist at times, cues for stance position and weight shifting. cues for posture as well (pt tending to flex at hips).         PT Education - 10/10/19 1311    Education Details  Reviewed sit<>stands with HEP with cues needed on technique/form.    Person(s) Educated  Patient;Spouse    Methods  Explanation;Demonstration;Tactile cues;Verbal cues    Comprehension  Verbalized understanding;Returned demonstration;Verbal cues required;Tactile cues required       PT Short Term Goals - 09/19/19 1643      PT SHORT TERM GOAL #1   Title  Patient to be independent with initial HEP. (target for STG 4 weeks 10/19/19)    Status  New      PT SHORT TERM GOAL #2   Title  Pt will improve TUG to less than 20 sec    Baseline  25    Status  New      PT SHORT TERM GOAL #3   Title  Pt will improve BERG to more than 40    Baseline  33    Status  New        PT Long Term Goals - 09/19/19 1644      PT LONG TERM GOAL #1   Title  Patient to be  independent with advanced HEP. (Target for LTG 12 weeks 12/12/19)    Time  12    Period  Weeks    Status  New      PT LONG TERM GOAL #2   Title  Pt will improve TUG to less than 17 seconds    Baseline  25    Time  12    Period  Weeks    Status  New      PT LONG TERM GOAL #3   Title  Patient to score <20 sec on 5xSTS without UE support to decrease risk of falls.    Baseline  38    Time  12    Period  Weeks    Status  New      PT LONG TERM GOAL #4   Title  Patient to improve gait speed to 2 ft/sec with LRAD    Baseline  1.66    Time  12    Period  Weeks    Status  New      PT LONG TERM GOAL #5   Title  Pt to ambulate limited community distances mod I at least 450 ft with LRAD and negotiate up/down one flight of stairs.    Time  12    Period  Weeks    Status  New      Additional Long Term Goals   Additional Long Term Goals  Yes      PT LONG TERM GOAL #6   Title  Pt will improve BERG to at least 45 to show improved balance    Baseline  33    Time  12    Period  Weeks    Status  New            Plan - 10/10/19 0720    Clinical Impression Statement  Today's skilled session continued to focus on gait training with cane, LE strengthening and balance reactions. Pt needs up to mod assist for balance with activites with no UE support, min assist with UE support. Reviewed again this session sit<>stands with reaching forward as pt continues to not understand how to do this at home. Good carryover after review in session. Spouse present for education as well so he will know how to cue pt at home. The pt is progressing well toward goals and should benefit from continued PT to progress toward unmet goals.    Personal Factors and Comorbidities  Comorbidity 3+    Examination-Activity Limitations  Locomotion Level;Transfers;Reach Overhead;Carry;Squat;Stairs;Lift;Stand    Examination-Participation Restrictions  Meal Prep;Cleaning;Community Activity;Driving;Laundry;Shop     Stability/Clinical Decision Making  Evolving/Moderate complexity    Rehab Potential  Good    PT Frequency  2x / week    PT Duration  12 weeks    PT Next Visit Plan  needs to work on gait, balance, Lt sided strength, progress to stairs when able    PT Home Exercise Plan  Access Code: [email or web address]    Consulted and Agree with Plan of Care  Patient       Patient will benefit from skilled therapeutic intervention in order to improve the following deficits and impairments:  Abnormal gait, Decreased activity tolerance, Decreased balance, Decreased coordination, Decreased endurance, Decreased mobility, Decreased range of motion, Decreased strength, Difficulty walking, Impaired UE functional use, Postural dysfunction, Improper body mechanics  Visit Diagnosis: Unsteadiness on feet  Muscle weakness (generalized)  Cerebrovascular accident (CVA) due to other mechanism [facility])     Problem List Patient Active Problem List   Diagnosis Date Noted  . 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 Anticoagulated on warfarin   . Left hemiparesis ([place])   . History of CVA with residual deficit   . Thrombocytopenia ([place])   . Prediabetes   .  CVA (cerebral vascular accident) ([place]) 09/03/2019  . Vertigo 09/24/2017  . Benign paroxysmal positional vertigo   . History of stroke   . Hyperlipemia 08/23/2017  . Encounter for therapeutic drug monitoring 08/08/2017  . Long term (current) use of anticoagulants [Z79.01] 07/12/2017  . Hemiparesis and alteration of sensations as late effects of stroke ([place]) 09/07/2016  . Abnormality of gait 09/07/2016  . HTN (hypertension) 04/13/2016  . H/O mitral valve replacement with mechanical valve 10/16/2015  . Diabetes mellitus without complication ([place])   . Hemiparesis (L sided - mild) due to old stroke ([place])     Willow Ora, PTA, [facility] [address] [place], [place] [address] [phone] 10/10/19, 1:13 PM   Name: [name] MRN:  [id number] Date of Birth: 05-24-1946

## 2019-10-10 NOTE — Therapy (Signed)
[facility] [address] [place], [place], [address] Phone: [phone]   Fax:  [phone]  Occupational Therapy Treatment  Patient Details  Name: [name] MRN: [id number] Date of Birth: May 11, 1946 Referring Provider (OT): Dr. Letta Pate   Encounter Date: 10/10/2019  OT End of Session - 10/10/19 1029    Visit Number  2    Number of Visits  16    Date for OT Re-Evaluation  11/28/19   goal date adjusted as pt has just now returned for first treatment after eval   Authorization Type  medicare will need PN every 10th visit    Authorization Time Period  90 days    Authorization - Visit Number  2    Authorization - Number of Visits  10    OT Start Time  0801    OT Stop Time  0845    OT Time Calculation (min)  44 min    Activity Tolerance  Patient tolerated treatment well       Past Medical History:  Diagnosis Date  . Abnormality of gait 09/07/2016  . Allergy   . Diabetes mellitus without complication [facility])    Patient denies this - notes history of glucose intolerance  . Hemiparesis and alteration of sensations as late effects of stroke ([place]) 09/07/2016  . History of pneumonia 1997  . S/P MVR (mitral valve replacement)    Mechanical mitral valve replacement at age 44 (done in [place])  // echo 7/17: EF 55-60, normal wall motion, bileaflet mechanical mitral valve prosthesis functioning normally, mild LAE, mildly reduced RVSF, small pericardial effusion  . Stroke [facility]) 1997, 2013, 2015    Past Surgical History:  Procedure Laterality Date  . ABDOMINAL HYSTERECTOMY    . BRAIN SURGERY    . BUNIONECTOMY  1993  . CARDIAC VALVE REPLACEMENT  1997  . TOE SURGERY  1996    There were no vitals filed for this visit.  Subjective Assessment - 10/10/19 0803    Subjective   I like these goals    Pertinent History  09/02/2019 R CVA ( suspect extension of old infarct affecting deep white matter and small focus adjacent  to cortical brain.  PMH:  prior CVA's first in April of '97, August of 97, fall of 2014, spring of 2015.    Patient Stated Goals  I hope my hand will get better "I want it down and I know I am suppose to swing it more naturally when I walk.  Sometimes I walk into the door frame on my left.    Currently in Pain?  Yes    Pain Score  2     Pain Location  Hip    Pain Orientation  Left    Pain Descriptors / Indicators  Aching;Sore;Sharp    Pain Type  Chronic pain    Pain Onset  More than a month ago    Pain Frequency  Intermittent    Aggravating Factors   Sciatica pain    Pain Relieving Factors  bengay, tylenol                   OT Treatments/Exercises (OP) - 10/10/19 1024      ADLs   ADL Comments  reviewed OT eval, goals and POC. Pt in agreement. Provided written copy of goals to pt.       Neurological Re-education Exercises   Other Exercises 1  Neuro re ed to develop and initiate HEP for grip  strength and coordination.  After instruction, practice and cues pt able to return demonstrate. Husband present and able to cue pt prn. Cues also incorporated into written HEP.  Encouraged pt to look at left hand when doing activities to assist with motor plannning and hand orientation.              OT Education - 10/10/19 1026    Education Details  OT goals and POC; HEP for grip strength and coordination.    Person(s) Educated  Patient;Spouse    Methods  Explanation;Demonstration;Verbal cues;Handout    Comprehension  Verbalized understanding;Returned demonstration       OT Short Term Goals - 10/10/19 1027      OT SHORT TERM GOAL #1   Title  Pt and husband will be mod I with HEP to address L grip strength and L coordination - 10/31/19 (date adjused as pt has just now returned for first treatment session)    Status  Achieved      OT SHORT TERM GOAL #2   Title  Pt will be mod I with cutting food on plate AE prn    Status  On-going      OT SHORT TERM GOAL #3   Title  Pt  will demonstrate understanding of use of button hook for smaller buttons    Status  On-going      OT SHORT TERM GOAL #4   Title  Pt will be mod I with bathing back AE prn    Status  On-going      OT SHORT TERM GOAL #5   Title  Pt will be supervision for toilet tranfers    Status  On-going      OT SHORT TERM GOAL #6   Title  Pt will demonstrate improved grip strength by at least 4 pounds to assist with light housekeeping tasks (baseline = 22)    Status  On-going        OT Long Term Goals - 10/10/19 1028      OT LONG TERM GOAL #1   Title  Pt and husband will be mod I with HEP to address LUE strength/funtional use and improved activity tolerance. - 11/28/2019 (date adjusted as pt just now returning for first treatment session)    Status  On-going      OT LONG TERM GOAL #2   Title  Pt will be mod I with toilet transfers    Status  On-going      OT LONG TERM GOAL #3   Title  Pt will be mod I with simple snack prep at ambulatory level    Status  On-going      OT LONG TERM GOAL #4   Title  Pt will be mod I with laundry    Status  On-going      OT LONG TERM GOAL #5   Title  Pt will require no more than supervsion for short shopping trips (i.e. [employer], [employer])    Status  On-going      OT LONG TERM GOAL #6   Title  Pt will demonstrate improved grip strength by at least 6 pounds to assist with cold snack prep and light housekeeping (baseline = 22)    Status  On-going      OT LONG TERM GOAL #7   Title  Pt will demonstrate overhead reach with LUE to at least 110* of shoulder flexion with LUE to obtain light weight object in overhead cabinet  Status  On-going      OT LONG TERM GOAL #8   Title  Pt and husband will verbalize understanding of driving recommendations    Status  On-going            Plan - 10/10/19 1028    Clinical Impression Statement  Pt in agreement with OT goals and POC. Pt progressing toward goals.    OT Occupational Profile and History   Detailed Assessment- Review of Records and additional review of physical, cognitive, psychosocial history related to current functional performance    Occupational performance deficits (Please refer to evaluation for details):  ADL's;IADL's;Leisure;Social Participation    Body Structure / Function / Physical Skills  ADL;Balance;Coordination;Decreased knowledge of use of DME;Dexterity;IADL;GMC;FMC;Endurance;Mobility;Proprioception;ROM;Sensation;Strength;Tone;UE functional use    Rehab Potential  Good    Clinical Decision Making  Several treatment options, min-mod task modification necessary    Comorbidities Affecting Occupational Performance:  Presence of comorbidities impacting occupational performance    Comorbidities impacting occupational performance description:  see above PMH    Modification or Assistance to Complete Evaluation   Min-Moderate modification of tasks or assist with assess necessary to complete eval    OT Frequency  2x / week    OT Duration  8 weeks    OT Treatment/Interventions  Self-care/ADL training;Aquatic Therapy;Moist Heat;Therapeutic exercise;Neuromuscular education;DME and/or AE instruction;Passive range of motion;Manual Therapy;Therapist, nutritional;Therapeutic activities;Visual/perceptual remediation/compensation;Patient/family education;Balance training    Plan  check HEP for grip and coordination as able, manual therapy and NMR to address LUE ROM, NMR for LUE functional use, mobility, postural alignment and control and balance.    Consulted and Agree with Plan of Care  Patient;Family member/caregiver    Family Member Consulted  husband       Patient will benefit from skilled therapeutic intervention in order to improve the following deficits and impairments:   Body Structure / Function / Physical Skills: ADL, Balance, Coordination, Decreased knowledge of use of DME, Dexterity, IADL, GMC, FMC, Endurance, Mobility, Proprioception, ROM, Sensation, Strength, Tone,  UE functional use       Visit Diagnosis: Unsteadiness on feet  Hemiplegia and hemiparesis following cerebral infarction affecting left non-dominant side (HCC)  Apraxia  Ataxia  Abnormal posture  Other symptoms and signs involving the nervous system  Stiffness of left shoulder joint    Problem List Patient Active Problem List   Diagnosis Date Noted  . 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 Anticoagulated on warfarin   . Left hemiparesis ([place])   . History of CVA with residual deficit   . Thrombocytopenia ([place])   . Prediabetes   . CVA (cerebral vascular accident) ([place]) 09/03/2019  . Vertigo 09/24/2017  . Benign paroxysmal positional vertigo   . History of stroke   . Hyperlipemia 08/23/2017  . Encounter for therapeutic drug monitoring 08/08/2017  . Long term (current) use of anticoagulants [Z79.01] 07/12/2017  . Hemiparesis and alteration of sensations as late effects of stroke ([place]) 09/07/2016  . Abnormality of gait 09/07/2016  . HTN (hypertension) 04/13/2016  . H/O mitral valve replacement with mechanical valve 10/16/2015  . Diabetes mellitus without complication ([place])   . Hemiparesis (L sided - mild) due to old stroke [facility])     Quay Burow, OTR/L 10/10/2019, 10:34 AM  [facility] [address] [place], [place], [address] Phone: [phone]   Fax:  [phone]  Name: MYALEE TACK MRN: [id number] Date of Birth: Jan 23, 1946

## 2019-10-10 NOTE — Patient Instructions (Signed)
1. Grip Strengthening (Resistive Putty) Use the RED putty. Make sure you do long hard squeezes.  Make sure you make it into a fat hot dog shape in between each squeeze.  Its perfectly ok to rest while you are doing it and to use 2 hands if you need to put it back into the fat hot dog shape.    Squeeze putty using thumb and all fingers. Repeat _20___ times. Do __2__ sessions per day.   2. Roll putty into tube using both hands (right hand over left hand to guide it) on table and pinch between your fingers.  Then fold putty in half and roll out again.  Do 5 times. Do 2 sessions per day.    Copyright  VHI. All rights reserved.    Coordination Activities  Perform the following activities for 15- 20  minutes 1-2  times per day with left hand(s).   Toss ball between hands.  Toss ball in air and catch with the same hand.  Flip cards 1 at a time.  Don't worry about speed right now - focus on hand control.  Pick up coins one at a time and place in container or coin bank. Focus on control right not speed  Screw together nuts and bolts, then unfasten. Make sure the LEFT hand does the turning.  Go slow and look at your hand.  Focus on control not speed.

## 2019-10-15 ENCOUNTER — Ambulatory Visit: Payer: Medicare Other | Admitting: Occupational Therapy

## 2019-10-15 ENCOUNTER — Ambulatory Visit: Payer: Medicare Other | Admitting: Rehabilitation

## 2019-10-15 ENCOUNTER — Ambulatory Visit (INDEPENDENT_AMBULATORY_CARE_PROVIDER_SITE_OTHER): Payer: Medicare Other | Admitting: Interventional Cardiology

## 2019-10-15 ENCOUNTER — Other Ambulatory Visit: Payer: Self-pay

## 2019-10-15 ENCOUNTER — Telehealth: Payer: Self-pay | Admitting: Rehabilitation

## 2019-10-15 DIAGNOSIS — I69354 Hemiplegia and hemiparesis following cerebral infarction affecting left non-dominant side: Secondary | ICD-10-CM

## 2019-10-15 DIAGNOSIS — M25552 Pain in left hip: Secondary | ICD-10-CM

## 2019-10-15 DIAGNOSIS — H04123 Dry eye syndrome of bilateral lacrimal glands: Secondary | ICD-10-CM | POA: Diagnosis not present

## 2019-10-15 DIAGNOSIS — Z5181 Encounter for therapeutic drug level monitoring: Secondary | ICD-10-CM | POA: Diagnosis not present

## 2019-10-15 DIAGNOSIS — R2681 Unsteadiness on feet: Secondary | ICD-10-CM

## 2019-10-15 DIAGNOSIS — I6389 Other cerebral infarction: Secondary | ICD-10-CM

## 2019-10-15 DIAGNOSIS — R270 Ataxia, unspecified: Secondary | ICD-10-CM

## 2019-10-15 DIAGNOSIS — M7060 Trochanteric bursitis, unspecified hip: Secondary | ICD-10-CM

## 2019-10-15 LAB — POCT INR: INR: 3.6 — AB (ref 2.0–3.0)

## 2019-10-15 NOTE — Therapy (Signed)
[facility] [address] [place], [place], [address] Phone: [phone]   Fax:  [phone]  Physical Therapy Treatment  Patient Details  Name: [name] MRN: [id number] Date of Birth: 1945/12/18 Referring Provider (PT): [name], MD   Encounter Date: 10/15/2019  PT End of Session - 10/15/19 1507    Visit Number  4    Number of Visits  24    Date for PT Re-Evaluation  12/12/19    Authorization Type  MCR    PT Start Time  1152   PT was late to session   PT Stop Time  1230    PT Time Calculation (min)  38 min    Equipment Utilized During Treatment  Gait belt    Activity Tolerance  Patient tolerated treatment well    Behavior During Therapy  [facility] for tasks assessed/performed       NOTE:  Subjective intake and BERG balance assessment (the few tasks that were done) done by Guido Sander, PT as Cameron Sprang, PT was finishing up with other session.   Past Medical History:  Diagnosis Date  . Abnormality of gait 09/07/2016  . Allergy   . Diabetes mellitus without complication [facility])    Patient denies this - notes history of glucose intolerance  . Hemiparesis and alteration of sensations as late effects of stroke ([place]) 09/07/2016  . History of pneumonia 1997  . S/P MVR (mitral valve replacement)    Mechanical mitral valve replacement at age 48 (done in [place])  // echo 7/17: EF 55-60, normal wall motion, bileaflet mechanical mitral valve prosthesis functioning normally, mild LAE, mildly reduced RVSF, small pericardial effusion  . Stroke [facility]) 1997, 2013, 2015    Past Surgical History:  Procedure Laterality Date  . ABDOMINAL HYSTERECTOMY    . BRAIN SURGERY    . BUNIONECTOMY  1993  . CARDIAC VALVE REPLACEMENT  1997  . TOE SURGERY  1996    There were no vitals filed for this visit.  Subjective Assessment - 10/15/19 1206    Subjective  Pt reports pain in Lt hip and lateral thigh    How long can  you sit comfortably?  1-2 min    Diagnostic tests  MRI showed old hemorrhagic infarct in the right parietal cortical and subcortical brain which had progressed atrophy, encephalomalacia.  Suspicion of minor acute extension along the anterior margin of the old infarction affecting the deep white matter and small focus adjacent cortical brain.    Currently in Pain?  Yes    Pain Score  4     Pain Location  Hip    Pain Orientation  Left    Pain Descriptors / Indicators  Aching;Sore;Throbbing    Pain Onset  More than a month ago    Pain Frequency  Intermittent    Aggravating Factors   standing    Pain Relieving Factors  bengay and tylenol                       [facility] Adult PT Treatment/Exercise - 10/15/19 1209      Berg Balance Test   Sit to Stand  Able to stand without using hands and stabilize independently    Standing Unsupported  Able to stand 2 minutes with supervision   inconsistent performance   Sitting with Back Unsupported but Feet Supported on Floor or Stool  Able to sit safely and securely 2 minutes    Stand to  Sit  Sits safely with minimal use of hands    Standing Unsupported with Eyes Closed  Able to stand 10 seconds with supervision    Standing Ubsupported with Feet Together  Able to place feet together independently and stand for 1 minute with supervision      Upon palpation with pt in R SL, note marked tenderness over L hip bursa, along IT band and also at medial distal hamstring, will send note to MD to add to treatment plan.    Therex:  Instructed in supine L IT band stretch with strap which she tolerated well.  Attempted to perform in standing, however due to imbalance and decreased sensation, could not perform appropriately.  Also instructed in frozen water bottle ice massage to reduce inflammation and also address tightness.  Pt does report improvement in pain following stretching during session.       PT Education - 10/15/19 1506    Education Details   Added IT band stretch to HEP, verbally educated on frozen water bottle for ice massage.    Person(s) Educated  Patient;Spouse    Methods  Explanation;Demonstration;Handout    Comprehension  Verbalized understanding;Returned demonstration       PT Short Term Goals - 09/19/19 1643      PT SHORT TERM GOAL #1   Title  Patient to be independent with initial HEP. (target for STG 4 weeks 10/19/19)    Status  New      PT SHORT TERM GOAL #2   Title  Pt will improve TUG to less than 20 sec    Baseline  25    Status  New      PT SHORT TERM GOAL #3   Title  Pt will improve BERG to more than 40    Baseline  33    Status  New        PT Long Term Goals - 09/19/19 1644      PT LONG TERM GOAL #1   Title  Patient to be independent with advanced HEP. (Target for LTG 12 weeks 12/12/19)    Time  12    Period  Weeks    Status  New      PT LONG TERM GOAL #2   Title  Pt will improve TUG to less than 17 seconds    Baseline  25    Time  12    Period  Weeks    Status  New      PT LONG TERM GOAL #3   Title  Patient to score <20 sec on 5xSTS without UE support to decrease risk of falls.    Baseline  38    Time  12    Period  Weeks    Status  New      PT LONG TERM GOAL #4   Title  Patient to improve gait speed to 2 ft/sec with LRAD    Baseline  1.66    Time  12    Period  Weeks    Status  New      PT LONG TERM GOAL #5   Title  Pt to ambulate limited community distances mod I at least 450 ft with LRAD and negotiate up/down one flight of stairs.    Time  12    Period  Weeks    Status  New      Additional Long Term Goals   Additional Long Term Goals  Yes      PT LONG  TERM GOAL #6   Title  Pt will improve BERG to at least 45 to show improved balance    Baseline  33    Time  12    Period  Weeks    Status  New            Plan - 10/15/19 1507    Clinical Impression Statement  Skilled session initially to begin checking STGs with BERG balance assessment, however upon performing  first several standing tasks, pt with increasing L hip pain.  Upon assessment, pt seems to have a L hip busitis and L hamstring tightness.  Provided IT band stretch and verbally educated on how to roll with frozen water bottle.  Will send request to Dr. Letta Pate to add hip pain to POC.    Personal Factors and Comorbidities  Comorbidity 3+    Examination-Activity Limitations  Locomotion Level;Transfers;Reach Overhead;Carry;Squat;Stairs;Lift;Stand    Examination-Participation Restrictions  Meal Prep;Cleaning;Community Activity;Driving;Laundry;Shop    Stability/Clinical Decision Making  Evolving/Moderate complexity    Rehab Potential  Good    PT Frequency  2x / week    PT Duration  12 weeks    PT Treatment/Interventions  Passive range of motion;Iontophoresis 4mg /ml Dexamethasone;Ultrasound;Electrical Stimulation;Cryotherapy;Manual techniques    PT Next Visit Plan  See how stretching for L hip is going, if added to referral-perform ultrasound/ionto as able.  If pain is better, check STGs, needs to work on gait, balance, Lt sided strength, progress to stairs when able    PT Home Exercise Plan  Access Code: [email or web address]    Consulted and Agree with Plan of Care  Patient       Patient will benefit from skilled therapeutic intervention in order to improve the following deficits and impairments:  Abnormal gait, Decreased activity tolerance, Decreased balance, Decreased coordination, Decreased endurance, Decreased mobility, Decreased range of motion, Decreased strength, Difficulty walking, Impaired UE functional use, Postural dysfunction, Improper body mechanics  Visit Diagnosis: Unsteadiness on feet  Hemiplegia and hemiparesis following cerebral infarction affecting left non-dominant side [facility])     Problem List Patient Active Problem List   Diagnosis Date Noted  . 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 Anticoagulated on warfarin   . Left hemiparesis ([place])   . History of CVA with residual  deficit   . Thrombocytopenia ([place])   . Prediabetes   . CVA (cerebral vascular accident) ([place]) 09/03/2019  . Vertigo 09/24/2017  . Benign paroxysmal positional vertigo   . History of stroke   . Hyperlipemia 08/23/2017  . Encounter for therapeutic drug monitoring 08/08/2017  . Long term (current) use of anticoagulants [Z79.01] 07/12/2017  . Hemiparesis and alteration of sensations as late effects of stroke ([place]) 09/07/2016  . Abnormality of gait 09/07/2016  . HTN (hypertension) 04/13/2016  . H/O mitral valve replacement with mechanical valve 10/16/2015  . Diabetes mellitus without complication ([place])   . Hemiparesis (L sided - mild) due to old stroke ([place])    Cameron Sprang, PT, MPT [facility] [address] [place], [place], [address] Phone: [phone]   Fax:  [phone] 10/15/19, 3:16 PM  Name: [name] MRN: [id number] Date of Birth: Sep 11, 1946

## 2019-10-15 NOTE — Patient Instructions (Signed)
Description   Spoke with pt and instructed pt to take 2.5 mg today and then continue taking 5mg  daily except 7.5mg  on Mondays and Fridays. Pt will continue greens with 2 cups of lettuce on Fridays. Pt switched from name brand Coumadin to Warfarin on 05/20/19. Does weekly checks due to insurance.

## 2019-10-15 NOTE — Therapy (Signed)
[facility] [address] [place], [place], [address] Phone: [phone]   Fax:  [phone]  Occupational Therapy Treatment  Patient Details  Name: [name] MRN: [id number] Date of Birth: [birth year] Referring Provider (OT): Dr. Letta Pate   Encounter Date: 10/15/2019  OT End of Session - 10/15/19 1215    Visit Number  3    Number of Visits  16    Date for OT Re-Evaluation  11/28/19    Authorization Type  medicare will need PN every 10th visit    Authorization Time Period  90 days    Authorization - Visit Number  3    Authorization - Number of Visits  10    OT Start Time  1100    OT Stop Time  1145    OT Time Calculation (min)  45 min    Activity Tolerance  Patient tolerated treatment well    Behavior During Therapy  [facility] for tasks assessed/performed       Past Medical History:  Diagnosis Date  . Abnormality of gait 09/07/2016  . Allergy   . Diabetes mellitus without complication [facility])    Patient denies this - notes history of glucose intolerance  . Hemiparesis and alteration of sensations as late effects of stroke ([place]) 09/07/2016  . History of pneumonia 1997  . S/P MVR (mitral valve replacement)    Mechanical mitral valve replacement at age 33 (done in [place])  // echo 7/17: EF 55-60, normal wall motion, bileaflet mechanical mitral valve prosthesis functioning normally, mild LAE, mildly reduced RVSF, small pericardial effusion  . Stroke [facility]) 1997, 2013, 2015    Past Surgical History:  Procedure Laterality Date  . ABDOMINAL HYSTERECTOMY    . BRAIN SURGERY    . BUNIONECTOMY  1993  . CARDIAC VALVE REPLACEMENT  1997  . TOE SURGERY  1996    There were no vitals filed for this visit.  Subjective Assessment - 10/15/19 1107    Patient is accompanied by:  Family member   Husband   Pertinent History  09/02/2019 R CVA ( suspect extension of old infarct affecting deep white matter and small focus adjacent  to cortical brain.  PMH:  prior CVA's first in April of '97, August of 97, fall of 2014, spring of 2015.    Patient Stated Goals  I hope my hand will get better "I want it down and I know I am suppose to swing it more naturally when I walk.  Sometimes I walk into the door frame on my left.    Currently in Pain?  Yes   chronic from scoliosis   Pain Location  Hip   and back   Pain Orientation  Left    Pain Type  Chronic pain    Pain Onset  More than a month ago    Aggravating Factors   standing    Pain Relieving Factors  bengay, tylenol       Verbally reviewed putty HEP - pt reports understanding of this.  Reviewed and practiced coordination HEP - therapist had to modify first 2 gross motor coord ex's: pt instructed to use hacky sack type ball for first exercise as pt was dropping often and it would roll away from her; modifed second ex to toss larger ball with BOTH hands and tap balloon Lt hand.  Pt shown alternative ways to tie Lt shoe for greater ease including crossing Lt foot over Rt knee or use of stool. Pt did  best w/ stool, but instructed to practice other way. Pt practiced cutting "food" w/ recommended shelf liner under plate to prevent from sliding, and larger handle on fork (holding w/ Lt hand w/ increased difficulty) while cutting w/ Rt hand. Pt demo decreased safety, and shown rocker knife to use w/ Rt hand only for greater safety. Pt also shown LH sponge as pt's husband is currently washing her back. Pt provided w/ handouts of all recommended A/E and told how/where to purchase.                       OT Short Term Goals - 10/15/19 1215      OT SHORT TERM GOAL #1   Title  Pt and husband will be mod I with HEP to address L grip strength and L coordination - 10/31/19 (date adjused as pt has just now returned for first treatment session)    Status  Achieved      OT SHORT TERM GOAL #2   Title  Pt will be mod I with cutting food on plate AE prn    Status  On-going    practiced in clinic w/ 2 options (rocker knife may be safest option)     OT SHORT TERM GOAL #3   Title  Pt will demonstrate understanding of use of button hook for smaller buttons    Status  On-going   awaiting already ordered button hook     OT SHORT TERM GOAL #4   Title  Pt will be mod I with bathing back AE prn    Status  On-going      OT SHORT TERM GOAL #5   Title  Pt will be supervision for toilet tranfers    Status  Achieved   mod I level (per pt/family report)     OT SHORT TERM GOAL #6   Title  Pt will demonstrate improved grip strength by at least 4 pounds to assist with light housekeeping tasks (baseline = 22)    Status  On-going        OT Long Term Goals - 10/10/19 1028      OT LONG TERM GOAL #1   Title  Pt and husband will be mod I with HEP to address LUE strength/funtional use and improved activity tolerance. - 11/28/2019 (date adjusted as pt just now returning for first treatment session)    Status  On-going      OT LONG TERM GOAL #2   Title  Pt will be mod I with toilet transfers    Status  On-going      OT LONG TERM GOAL #3   Title  Pt will be mod I with simple snack prep at ambulatory level    Status  On-going      OT LONG TERM GOAL #4   Title  Pt will be mod I with laundry    Status  On-going      OT LONG TERM GOAL #5   Title  Pt will require no more than supervsion for short shopping trips (i.e. [employer], [employer])    Status  On-going      OT LONG TERM GOAL #6   Title  Pt will demonstrate improved grip strength by at least 6 pounds to assist with cold snack prep and light housekeeping (baseline = 22)    Status  On-going      OT LONG TERM GOAL #7   Title  Pt will demonstrate overhead  reach with LUE to at least 110* of shoulder flexion with LUE to obtain light weight object in overhead cabinet    Status  On-going      OT LONG TERM GOAL #8   Title  Pt and husband will verbalize understanding of driving recommendations    Status  On-going             Plan - 10/15/19 1217    Clinical Impression Statement  Pt progressing towards goals and already met toileting goal. Pt now going to restroom herself    Occupational performance deficits (Please refer to evaluation for details):  ADL's;IADL's;Leisure;Social Participation    Body Structure / Function / Physical Skills  ADL;Balance;Coordination;Decreased knowledge of use of DME;Dexterity;IADL;GMC;FMC;Endurance;Mobility;Proprioception;ROM;Sensation;Strength;Tone;UE functional use    Rehab Potential  Good    Comorbidities impacting occupational performance description:  see above PMH    OT Frequency  2x / week    OT Duration  8 weeks    OT Treatment/Interventions  Self-care/ADL training;Aquatic Therapy;Moist Heat;Therapeutic exercise;Neuromuscular education;DME and/or AE instruction;Passive range of motion;Manual Therapy;Therapist, nutritional;Therapeutic activities;Visual/perceptual remediation/compensation;Patient/family education;Balance training    Plan  manual therapy and NMR to address LUE ROM, NMR for LUE functional use, mobility, postural alignment and control and balance.    Consulted and Agree with Plan of Care  Patient;Family member/caregiver    Family Member Consulted  husband       Patient will benefit from skilled therapeutic intervention in order to improve the following deficits and impairments:   Body Structure / Function / Physical Skills: ADL, Balance, Coordination, Decreased knowledge of use of DME, Dexterity, IADL, GMC, FMC, Endurance, Mobility, Proprioception, ROM, Sensation, Strength, Tone, UE functional use       Visit Diagnosis: Ataxia  Hemiplegia and hemiparesis following cerebral infarction affecting left non-dominant side [facility])    Problem List Patient Active Problem List   Diagnosis Date Noted  . 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 Anticoagulated on warfarin   . Left hemiparesis ([place])   . History of CVA with residual deficit    . Thrombocytopenia ([place])   . Prediabetes   . CVA (cerebral vascular accident) ([place]) 09/03/2019  . Vertigo 09/24/2017  . Benign paroxysmal positional vertigo   . History of stroke   . Hyperlipemia 08/23/2017  . Encounter for therapeutic drug monitoring 08/08/2017  . Long term (current) use of anticoagulants [Z79.01] 07/12/2017  . Hemiparesis and alteration of sensations as late effects of stroke ([place]) 09/07/2016  . Abnormality of gait 09/07/2016  . HTN (hypertension) 04/13/2016  . H/O mitral valve replacement with mechanical valve 10/16/2015  . Diabetes mellitus without complication ([place])   . Hemiparesis (L sided - mild) due to old stroke [facility])     Carey Bullocks, OTR/L 10/15/2019, 12:18 PM  [facility] [address] [place], [place], [address] Phone: [phone]   Fax:  [phone]  Name: [name] MRN: [id number] Date of Birth: 1946/01/05

## 2019-10-15 NOTE — Telephone Encounter (Signed)
Hey Dr. Raliegh Ip,   I am seeing Mrs. [name] at [facility] neuro for PT.  Note that she has a suspected L hip bursitis (likely from gait compensations/weakness).  I want to incorporate that into our treatment plan.  Could you re-write a referral to include CVA but also L hip pain?? I also sent you a new cert to reflect the updated plan of care.    Thanks so much!! Cameron Sprang, PT, MPT [facility] [address] [place], [place], [address] Phone: [phone]   Fax:  [phone] 10/15/19, 3:27 PM

## 2019-10-16 DIAGNOSIS — R7301 Impaired fasting glucose: Secondary | ICD-10-CM | POA: Diagnosis not present

## 2019-10-16 DIAGNOSIS — I679 Cerebrovascular disease, unspecified: Secondary | ICD-10-CM | POA: Diagnosis not present

## 2019-10-16 DIAGNOSIS — Z7901 Long term (current) use of anticoagulants: Secondary | ICD-10-CM | POA: Diagnosis not present

## 2019-10-16 DIAGNOSIS — E7800 Pure hypercholesterolemia, unspecified: Secondary | ICD-10-CM | POA: Diagnosis not present

## 2019-10-17 ENCOUNTER — Encounter: Payer: Self-pay | Admitting: Physical Therapy

## 2019-10-17 ENCOUNTER — Other Ambulatory Visit: Payer: Self-pay

## 2019-10-17 ENCOUNTER — Ambulatory Visit: Payer: Medicare Other | Admitting: Physical Therapy

## 2019-10-17 DIAGNOSIS — M25552 Pain in left hip: Secondary | ICD-10-CM

## 2019-10-17 DIAGNOSIS — I69354 Hemiplegia and hemiparesis following cerebral infarction affecting left non-dominant side: Secondary | ICD-10-CM

## 2019-10-17 DIAGNOSIS — I6389 Other cerebral infarction: Secondary | ICD-10-CM

## 2019-10-17 DIAGNOSIS — R2681 Unsteadiness on feet: Secondary | ICD-10-CM

## 2019-10-19 DIAGNOSIS — Z1231 Encounter for screening mammogram for malignant neoplasm of breast: Secondary | ICD-10-CM | POA: Diagnosis not present

## 2019-10-19 NOTE — Therapy (Signed)
[facility] [address] [place], [place], [address] Phone: [phone]   Fax:  [phone]  Physical Therapy Treatment  Patient Details  Name: [name] MRN: [id number] Date of Birth: [birth year] Referring Provider (PT): Charlett Blake, MD   Encounter Date: 10/17/2019     10/17/19 1700  PT Visits / Re-Eval  Visit Number 5  Number of Visits 24  Date for PT Re-Evaluation 12/12/19  Authorization  Authorization Type MCR  PT Time Calculation  PT Start Time 9326  PT Stop Time 1445  PT Time Calculation (min) 43 min  PT - End of Session  Equipment Utilized During Treatment Gait belt  Activity Tolerance Patient tolerated treatment well;No increased pain  Behavior During Therapy WFL for tasks assessed/performed    Past Medical History:  Diagnosis Date  . Abnormality of gait 09/07/2016  . Allergy   . Diabetes mellitus without complication [facility])    Patient denies this - notes history of glucose intolerance  . Hemiparesis and alteration of sensations as late effects of stroke ([place]) 09/07/2016  . History of pneumonia 1997  . S/P MVR (mitral valve replacement)    Mechanical mitral valve replacement at age 23 (done in [place])  // echo 7/17: EF 55-60, normal wall motion, bileaflet mechanical mitral valve prosthesis functioning normally, mild LAE, mildly reduced RVSF, small pericardial effusion  . Stroke [facility]) 1997, 2013, 2015    Past Surgical History:  Procedure Laterality Date  . ABDOMINAL HYSTERECTOMY    . BRAIN SURGERY    . BUNIONECTOMY  1993  . CARDIAC VALVE REPLACEMENT  1997  . TOE SURGERY  1996    There were no vitals filed for this visit.      10/17/19 1409  Self-Care  Self-Care Other Self-Care Comments  Other Self-Care Comments  pt and spouse reported ice massage with bottle is helping. howvever having to wear gloves or not go as long due to too cold in hands. Educated on use of syrofoam cup  with frozen water for ice massage so hands are not in direct contact with the ice.   Modalities  Modalities Ultrasound  Ultrasound  Ultrasound Location left hip  Ultrasound Parameters 100%, 1 Mhz, 1.5 w/cm2 for 8 minutes  Ultrasound Goals Pain      10/17/19 1432  Berg Balance Test  Sit to Stand 4  Standing Unsupported 4  Sitting with Back Unsupported but Feet Supported on Floor or Stool 4  Stand to Sit 3  Transfers 3  Standing Unsupported with Eyes Closed 4  Standing Unsupported with Feet Together 3  From Standing, Reach Forward with Outstretched Arm 3 (5 inches)  From Standing Position, Pick up Object from Floor 3  From Standing Position, Turn to Look Behind Over each Shoulder 4  Turn 360 Degrees 2 (> 5 seconds)  Standing Unsupported, Alternately Place Feet on Step/Stool 3 (26.50 sec's)  Standing Unsupported, One Foot in Front 2  Standing on One Leg 2  Total Score 44       PT Short Term Goals - 10/17/19 1431      PT SHORT TERM GOAL #1   Title  Patient to be independent with initial HEP. (target for STG 4 weeks 10/19/19)    Baseline  10/17/19: met per pt and spouse report    Status  Achieved      PT SHORT TERM GOAL #2   Title  Pt will improve TUG to less than 20 sec    Baseline  25    Status  On-going      PT SHORT TERM GOAL #3   Title  Pt will improve BERG to more than 40    Baseline  10/17/19: 44/56 scored today    Status  Achieved            PT Long Term Goals - 09/19/19 1644      PT LONG TERM GOAL #1   Title  Patient to be independent with advanced HEP. (Target for LTG 12 weeks 12/12/19)    Time  12    Period  Weeks    Status  New      PT LONG TERM GOAL #2   Title  Pt will improve TUG to less than 17 seconds    Baseline  25    Time  12    Period  Weeks    Status  New      PT LONG TERM GOAL #3   Title  Patient to score <20 sec on 5xSTS without UE support to decrease risk of falls.    Baseline  38    Time  12    Period  Weeks    Status  New       PT LONG TERM GOAL #4   Title  Patient to improve gait speed to 2 ft/sec with LRAD    Baseline  1.66    Time  12    Period  Weeks    Status  New      PT LONG TERM GOAL #5   Title  Pt to ambulate limited community distances mod I at least 450 ft with LRAD and negotiate up/down one flight of stairs.    Time  12    Period  Weeks    Status  New      Additional Long Term Goals   Additional Long Term Goals  Yes      PT LONG TERM GOAL #6   Title  Pt will improve BERG to at least 45 to show improved balance    Baseline  33    Time  12    Period  Weeks    Status  New         10/17/19 1730  Plan  Clinical Impression Statement Today's skilled session initally address left hip pain via Ultrasound. The proceeded to check progress toward STGs with pt showing a increase of 44/56 (was 31/56). Will plan to check remaining goal at next session due to time contraints today. Pt reporting a decrease in left hip pain to 2/10 at end of session. The pt is making steady progress and should benefit from continued PT to progress toward unmet goals.  Personal Factors and Comorbidities Comorbidity 3+  Examination-Activity Limitations Locomotion Level;Transfers;Reach Overhead;Carry;Squat;Stairs;Lift;Stand  Examination-Participation Restrictions Meal Prep;Cleaning;Community Activity;Driving;Laundry;Shop  Pt will benefit from skilled therapeutic intervention in order to improve on the following deficits Abnormal gait;Decreased activity tolerance;Decreased balance;Decreased coordination;Decreased endurance;Decreased mobility;Decreased range of motion;Decreased strength;Difficulty walking;Impaired UE functional use;Postural dysfunction;Improper body mechanics  Stability/Clinical Decision Making Evolving/Moderate complexity  Rehab Potential Good  PT Frequency 2x / week  PT Duration 12 weeks  PT Treatment/Interventions Passive range of motion;Iontophoresis 67m/ml Dexamethasone;Ultrasound;Electrical  Stimulation;Cryotherapy;Manual techniques  PT Next Visit Plan how did she feel after use of ultrasound? continue with modalities/manual therapy as needed to treat hip pain on left; check remaining STG; work on sit to stand technique keeping left LE in neutral as it tends to IR (could be cause of some of  her hip pain); left LE strengthening targeting hip muscles when pain allows.  PT Home Exercise Plan Access Code: [email or web address]  Consulted and Agree with Plan of Care Patient          Patient will benefit from skilled therapeutic intervention in order to improve the following deficits and impairments:  Abnormal gait, Decreased activity tolerance, Decreased balance, Decreased coordination, Decreased endurance, Decreased mobility, Decreased range of motion, Decreased strength, Difficulty walking, Impaired UE functional use, Postural dysfunction, Improper body mechanics  Visit Diagnosis: Hemiplegia and hemiparesis following cerebral infarction affecting left non-dominant side (H[facility])  Unsteadiness on feet  Pain in left hip  Cerebrovascular accident (CVA) due to other mechanism [facility])     Problem List Patient Active Problem List   Diagnosis Date Noted  . 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 Anticoagulated on warfarin   . Left hemiparesis ([place])   . History of CVA with residual deficit   . Thrombocytopenia ([place])   . Prediabetes   . CVA (cerebral vascular accident) ([place]) 09/03/2019  . Vertigo 09/24/2017  . Benign paroxysmal positional vertigo   . History of stroke   . Hyperlipemia 08/23/2017  . Encounter for therapeutic drug monitoring 08/08/2017  . Long term (current) use of anticoagulants [Z79.01] 07/12/2017  . Hemiparesis and alteration of sensations as late effects of stroke ([place]) 09/07/2016  . Abnormality of gait 09/07/2016  . HTN (hypertension) 04/13/2016  . H/O mitral valve replacement with mechanical valve 10/16/2015  . Diabetes mellitus without complication ([place])   .  Hemiparesis (L sided - mild) due to old stroke ([place])     Willow Ora, PTA, [facility] [address] [place], [place] [address] [phone] 10/19/19, 7:49 AM   Name: [name] MRN: [id number] Date of Birth: 1946/06/17

## 2019-10-22 ENCOUNTER — Ambulatory Visit (INDEPENDENT_AMBULATORY_CARE_PROVIDER_SITE_OTHER): Payer: Medicare Other | Admitting: Adult Health

## 2019-10-22 ENCOUNTER — Ambulatory Visit (INDEPENDENT_AMBULATORY_CARE_PROVIDER_SITE_OTHER): Payer: Medicare Other | Admitting: Pharmacist

## 2019-10-22 ENCOUNTER — Encounter: Payer: Self-pay | Admitting: Adult Health

## 2019-10-22 ENCOUNTER — Other Ambulatory Visit: Payer: Self-pay

## 2019-10-22 VITALS — BP 102/71 | HR 72 | Temp 97.2°F | Ht 68.0 in | Wt 129.2 lb

## 2019-10-22 DIAGNOSIS — I69398 Other sequelae of cerebral infarction: Secondary | ICD-10-CM | POA: Diagnosis not present

## 2019-10-22 DIAGNOSIS — I639 Cerebral infarction, unspecified: Secondary | ICD-10-CM | POA: Diagnosis not present

## 2019-10-22 DIAGNOSIS — Z5181 Encounter for therapeutic drug level monitoring: Secondary | ICD-10-CM | POA: Diagnosis not present

## 2019-10-22 DIAGNOSIS — I69359 Hemiplegia and hemiparesis following cerebral infarction affecting unspecified side: Secondary | ICD-10-CM

## 2019-10-22 DIAGNOSIS — Z952 Presence of prosthetic heart valve: Secondary | ICD-10-CM

## 2019-10-22 DIAGNOSIS — E785 Hyperlipidemia, unspecified: Secondary | ICD-10-CM | POA: Diagnosis not present

## 2019-10-22 DIAGNOSIS — I10 Essential (primary) hypertension: Secondary | ICD-10-CM | POA: Diagnosis not present

## 2019-10-22 DIAGNOSIS — Z7901 Long term (current) use of anticoagulants: Secondary | ICD-10-CM | POA: Diagnosis not present

## 2019-10-22 LAB — POCT INR: INR: 2.9 (ref 2.0–3.0)

## 2019-10-22 NOTE — Patient Instructions (Signed)
Continue aspirin 81 mg daily and warfarin daily  and Lipitor for secondary stroke prevention  Continue to follow up with PCP regarding cholesterol and blood pressure management   Continue to work with PT/OT and follow-up on Friday with Dr. Letta Pate regarding chronic/worsening left hip pain  Continue to monitor blood pressure at home  Maintain strict control of hypertension with blood pressure goal below 130/90, diabetes with hemoglobin A1c goal below 6.5% and cholesterol with LDL cholesterol (bad cholesterol) goal below 70 mg/dL. I also advised the patient to eat a healthy diet with plenty of whole grains, cereals, fruits and vegetables, exercise regularly and maintain ideal body weight.  Followup in the future with me in 3 months or call earlier if needed       Thank you for coming to see [place] at [facility]. I hope we have been able to provide you high quality care today.  You may receive a patient satisfaction survey over the next few weeks. We would appreciate your feedback and comments so that we may continue to improve ourselves and the health of our patients.

## 2019-10-22 NOTE — Progress Notes (Signed)
[facility] [address] [place]. [place] [address] [phone]       HOSPITAL FOLLOW UP NOTE  [name] Date of Birth:  1946-03-18 Medical Record Number:  [id number]   Reason for Referral:  hospital stroke follow up    CHIEF COMPLAINT:  Chief Complaint  Patient presents with  . Hospitalization Follow-up    Alone. Rm 9. No new concerns at this time.     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  History obtainced from patient and chart review. Reviewed all radiology images and labs personally.  [name] is a 73 y.o. female with history of multiple strokess s/p craniotomy in the past presented on 09/03/2019 with increasedhemiparesis of the left arm and leg.  Stroke work-up showed acute extension along the anterior margin of old infarct with unknown etiology possibly failure of collaterals and history of mechanical heart valve on long-term AC and INR suboptimal at 2.1. MRI does confirm acute extension along the anterior margin of old infarction right parietal cortical and subcortical brain. Per Dr. Leonie Man during admission,"it is possible she had a transient hypoperfusion event as a new embolic event to the same distribution as the previous stroke would be unlikely.. The strokes are relatively small, and given the high risk of mechanical heart valve, I think it is reasonable to continue anticoagulation".  CTA head/neck mild arthrosclerosis but no evidence of stenosis.  2D echo EF of 55% with abnormal septal motion due to postop effect, LA severely dilated, RA mild dilation, small pericardial effusion and mild calcification/thickening of aortic valve.  Previously on aspirin and Coumadin and recommended continuation with INR goal 3-3.5.  HTN stable.  LDL 65 and recommended continuation of atorvastatin 20 mg daily.  Controlled DM with A1c 6.1.   Other stroke risk factors include advanced age, prior history of stroke, CAD and valve replacement.  She was discharged to [facility] for ongoing therapy needs.  [name] is a 73 year old female who is being seen today for hospital follow-up.  She continues to have mild left hemiparesis but has been experiencing issues with left hip pain and therapy believes possibly left hip bursitis and plans on evaluation by physical medicine rehab Dr. Letta Pate this Friday, 10/26/2019.  She continues to work with PT/OT with ongoing improvement.  Continues on aspirin and Coumadin without bleeding or bruising.  INR previously stable with levels remaining within goal range but today 2.9 with adjustment to Coumadin doses and ongoing follow-up with Coumadin clinic.  Continues on atorvastatin without myalgias.  Glucose levels stable.  Blood pressure today 102/71.  No further concerns      ROS:   14 system review of systems performed and negative with exception of joint pain and left-sided weakness  PMH:  Past Medical History:  Diagnosis Date  . Abnormality of gait 09/07/2016  . Allergy   . Diabetes mellitus without complication [facility])    Patient denies this - notes history of glucose intolerance  . 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 History of pneumonia 1997  . S/P MVR (mitral valve replacement)    Mechanical mitral valve replacement at age 73 (done in [place])  // echo 7/17: EF 55-60, normal wall motion, bileaflet mechanical mitral valve prosthesis functioning normally, mild LAE, mildly reduced RVSF, small pericardial effusion  . Stroke [facility]) 1997, 2013, 2015  PSH:  Past Surgical History:  Procedure Laterality Date  . ABDOMINAL HYSTERECTOMY    . BRAIN SURGERY    . BUNIONECTOMY  1993  . CARDIAC VALVE REPLACEMENT  1997  . TOE SURGERY  1996    Social History:  Social History   Socioeconomic History  . Marital status: Married    Spouse name: Not on file    . Number of children: 2  . 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 Highest education level: Not on file  Occupational History  . Occupation: Retired  Tobacco Use  . Smoking status: Never Smoker  . Smokeless tobacco: Never Used  Substance and Sexual Activity  . Alcohol use: No    Alcohol/week: 0.0 standard drinks  . Drug use: No  . Sexual activity: Not on file    Comment: Married  Other Topics Concern  . Not on file  Social History Narrative   Lives at home w/ her husband   Right-handed   Caffeine: none   Social Determinants of Health   Financial Resource Strain:   . Difficulty of Paying Living Expenses: Not on file  Food Insecurity:   . Worried About Charity fundraiser in the Last Year: Not on file  . Ran Out of Food in the Last Year: Not on file  Transportation Needs:   . Lack of Transportation (Medical): Not on file  . Lack of Transportation (Non-Medical): Not on file  Physical Activity:   . Days of Exercise per Week: Not on file  . Minutes of Exercise per Session: Not on file  Stress:   . Feeling of Stress : Not on file  Social Connections:   . Frequency of Communication with Friends and Family: Not on file  . Frequency of Social Gatherings with Friends and Family: Not on file  . Attends Religious Services: Not on file  . Active Member of Clubs or Organizations: Not on file  . Attends Archivist Meetings: Not on file  . Marital Status: Not on file  Intimate Partner Violence:   . Fear of Current or Ex-Partner: Not on file  . Emotionally Abused: Not on file  . Physically Abused: Not on file  . Sexually Abused: Not on file    Family History:  Family History  Problem Relation Age of Onset  . Cancer Mother        Bone  . Heart disease Mother   . Hyperlipidemia Mother   . Hypertension Mother   . Stroke Father   . Hypertension Father   . Heart attack Neg Hx     Medications:   Current Outpatient Medications on File Prior to Visit  Medication  Sig Dispense Refill  . acetaminophen (TYLENOL) 325 MG tablet Take 2 tablets (650 mg total) by mouth every 4 (four) hours as needed for mild pain (or temp > 37.5 C (99.5 F)).    Marland Kitchen amLODipine (NORVASC) 10 MG tablet Take 1 tablet (10 mg total) by mouth daily. 30 tablet 0  . aspirin EC 81 MG tablet Take 81 mg by mouth daily.     Marland Kitchen atorvastatin (LIPITOR) 20 MG tablet Take 1 tablet (20 mg total) by mouth daily. 30 tablet 0  . calcium carbonate 1250 MG capsule Take 1,200 mg by mouth at bedtime.     . cholecalciferol (VITAMIN D) 1000 UNITS tablet Take 2,000 Units daily by mouth.     . denosumab (PROLIA) 60 MG/ML SOLN injection Inject 60 mg into the skin  every 6 (six) months. Administer in upper arm, thigh, or abdomen    . docusate sodium (COLACE) 100 MG capsule Take 100 mg at bedtime by mouth.     Marland Kitchen omeprazole (PRILOSEC) 20 MG capsule Take 20 mg by mouth daily.    Marland Kitchen Propylene Glycol-Glycerin (SOOTHE) 0.6-0.6 % SOLN Place 1 drop into both eyes 2 (two) times daily as needed (dry eyes).    . warfarin (COUMADIN) 5 MG tablet TAKE BY MOUTH AS DIRECTED BY THE COUMADIN CLINIC FOR 30 DAYS. 45 tablet 3   No current facility-administered medications on file prior to visit.    Allergies:   Allergies  Allergen Reactions  . Zoloft [Sertraline] Hives, Itching and Rash     Physical Exam  Vitals:   10/22/19 1349  BP: 102/71  Pulse: 72  Temp: (!) 97.2 F (36.2 C)  TempSrc: Oral  Weight: 129 lb 3.2 oz (58.6 kg)  Height: 5\' 8"  (1.727 m)   Body mass index is 19.64 kg/m. No exam data present  Depression screen [facility] 2/9 10/22/2019  Decreased Interest 0  Down, Depressed, Hopeless 0  PHQ - 2 Score 0     General: well developed, well nourished, pleasant elderly Caucasian female, seated, in no evident distress Head: head normocephalic and atraumatic.   Neck: supple with no carotid or supraclavicular bruits Cardiovascular: regular rate and rhythm, no murmurs; mechanical valve click Musculoskeletal: no  deformity Skin:  no rash/petichiae Vascular:  Normal pulses all extremities   Neurologic Exam Mental Status: Awake and fully alert. Oriented to place and time. Recent and remote memory intact. Attention span, concentration and fund of knowledge appropriate. Mood and affect appropriate.  Cranial Nerves: Fundoscopic exam reveals sharp disc margins. Pupils equal, briskly reactive to light. Extraocular movements full without nystagmus. Visual fields full to confrontation. Hearing intact. Facial sensation intact. Face, tongue, palate moves normally and symmetrically.  Motor: Normal bulk and tone.  Chronic left hemiparesis 4/5 and left foot drop with use of AFO Sensory.: intact to touch , pinprick , position and vibratory sensation.  Coordination: Rapid alternating movements normal in all extremities except mildly decreased left hand. Finger-to-nose and heel-to-shin positive for ataxia on left side. Gait and Station: Arises from chair without difficulty. Stance is normal. Gait demonstrates  mild hemiplegic gait with favoring of left leg with ongoing hip pain and chronic weakness Reflexes: 1+ and symmetric. Toes downgoing.     NIHSS  0 Modified Rankin  2    Diagnostic Data (Labs, Imaging, Testing)  CT HEAD WO CONTRAST 09/02/2019 IMPRESSION: Chronic changes without acute abnormality.  CT ANGIO HEAD W OR WO CONTRAST CT ANGIO NECK W OR WO CONTRAST 09/04/2019 IMPRESSION: 1. No emergent finding. 2. Mild for age atherosclerosis with no flow limiting stenosis or ulceration affecting major vessels.  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 Suspicion of a minor acute extension along the anterior margin of the old infarction affecting the deep white matter and a small focus adjacent cortical brain.  Extensive old ischemic changes elsewhere  throughout the brain as outlined above.  Tiny stable meningioma along the left posterior falx without mass-effect upon the brain or apparent change since 2018.  ECHOCARDIOGRAM 09/04/2019 IMPRESSIONS  1. Left ventricular ejection fraction, by visual estimation, is 55 to 60%. The left ventricle has normal function. There is no left ventricular hypertrophy.  2. Abnormal septal motion consistent with post-operative status.  3. Global right ventricle has normal systolic function.The right ventricular size is normal. No increase in right ventricular wall thickness.  4. Left atrial size was severely dilated.  5. Right atrial size was mildly dilated.  6. Small pericardial effusion.  7. The pericardial effusion is circumferential.  8. The mitral valve has been repaired/replaced. No evidence of mitral valve regurgitation.  9. The tricuspid valve is normal in structure. Tricuspid valve regurgitation mild-moderate. 10. The aortic valve is normal in structure. Aortic valve regurgitation was not visualized by color flow Doppler. Mild aortic valve sclerosis without stenosis. 11. There is Mild calcification of the aortic valve. 12. There is Mild thickening of the aortic valve. 13. The pulmonic valve was normal in structure. Pulmonic valve regurgitation is trivial by color flow Doppler. 14. Normal pulmonary artery systolic pressure. 15. Mitral gradients are similar to the previous study.    ASSESSMENT: [name] is a 73 y.o. year old female presented with increased left hemiparesis on 09/03/2019 with stroke work-up showing acute extension along the anterior margin of old infarct secondary to unclear etiology with possible transient hypoperfusion. Vascular risk factors include multiple prior strokes, HTN, HLD, mechanical heart valve on AC with INR goal 3-3.5, advanced age and CAD.  Documented history of DM but patient adequately declines with A1c level indicating prediabetes. She endorses returning to  baseline left hemiparesis but does have flareup of chronic left hip pain    PLAN:  1. Acute extension of prior stroke: Continue aspirin 81 mg daily and warfarin daily  and atorvastatin for secondary stroke prevention. Maintain strict control of hypertension with blood pressure goal below 130/90, diabetes with hemoglobin A1c goal below 6.5% and cholesterol with LDL cholesterol (bad cholesterol) goal below 70 mg/dL.  I also advised the patient to eat a healthy diet with plenty of whole grains, cereals, fruits and vegetables, exercise regularly with at least 30 minutes of continuous activity daily and maintain ideal body weight. 2. HTN: Advised to continue current treatment regimen. Advised to continue to monitor at home along with continued follow-up with PCP for management.  Discussion regarding avoidance of hypotension to ensure adequate perfusion 3. HLD: Advised to continue current treatment regimen along with continued follow-up with PCP for future prescribing and monitoring of lipid panel 4. Mechanical heart valve: Defer monitoring and management to cardiology 5. Chronic left hemiparesis: Returned to baseline.  Advised to continue to work with PT/OT and will be evaluated by physical medicine and rehab regarding management of chronic left hip pain with recent worsening    Follow up in 3 months or call earlier if needed   Greater than 50% of time during this 45 minute visit was spent on counseling, explanation of diagnosis of acute extension of prior stroke, reviewing risk factor management of HTN, HLD and mechanical heart valve, planning of further management along with potential future management, and discussion with patient and family answering all questions.    Frann Rider, AGNP-BC  [facility] [address] [place], [place] [address]  Phone [phone] Fax [phone] Note: This document was prepared with digital dictation and possible smart  phrase technology. Any transcriptional errors that result from this process are unintentional.

## 2019-10-23 ENCOUNTER — Ambulatory Visit: Payer: Medicare Other | Admitting: Physical Therapy

## 2019-10-23 ENCOUNTER — Ambulatory Visit: Payer: Medicare Other | Admitting: Occupational Therapy

## 2019-10-23 ENCOUNTER — Other Ambulatory Visit: Payer: Self-pay

## 2019-10-23 ENCOUNTER — Encounter: Payer: Self-pay | Admitting: Physical Therapy

## 2019-10-23 DIAGNOSIS — M6281 Muscle weakness (generalized): Secondary | ICD-10-CM

## 2019-10-23 DIAGNOSIS — R2681 Unsteadiness on feet: Secondary | ICD-10-CM

## 2019-10-23 DIAGNOSIS — I69354 Hemiplegia and hemiparesis following cerebral infarction affecting left non-dominant side: Secondary | ICD-10-CM

## 2019-10-23 DIAGNOSIS — R482 Apraxia: Secondary | ICD-10-CM

## 2019-10-23 DIAGNOSIS — M25552 Pain in left hip: Secondary | ICD-10-CM

## 2019-10-23 DIAGNOSIS — I6389 Other cerebral infarction: Secondary | ICD-10-CM

## 2019-10-23 NOTE — Progress Notes (Signed)
I agree with the above plan 

## 2019-10-23 NOTE — Patient Instructions (Signed)
Cranial Flexion: Overhead Arm Extension - Supine (Medicine Diona Foley)    Lie with knees straight, arms beyond head, holding _shoe box or paper towel roll. Pull ball up to above face. Repeat _10___ times per set. Do __1__ sets per session. Do __2__ sessions per day  USE Left arm for functional tasks:   Folding laundry Putting light groceries away Hanging clothes Bathing Making sandwich Picking up phone/remote Setting table

## 2019-10-23 NOTE — Therapy (Signed)
[facility] [address] [place], [place], [address] Phone: [phone]   Fax:  [phone]  Physical Therapy Treatment  Patient Details  Name: [name] MRN: [id number] Date of Birth: 1946-02-11 Referring Provider (PT): [name], MD   Encounter Date: 10/23/2019  PT End of Session - 10/23/19 0806    Visit Number  6    Number of Visits  24    Date for PT Re-Evaluation  12/12/19    Authorization Type  MCR    PT Start Time  0803    PT Stop Time  5993    PT Time Calculation (min)  41 min    Equipment Utilized During Treatment  Gait belt    Activity Tolerance  Patient tolerated treatment well;No increased pain    Behavior During Therapy  WFL for tasks assessed/performed       Past Medical History:  Diagnosis Date  . Abnormality of gait 09/07/2016  . Allergy   . Diabetes mellitus without complication [facility])    Patient denies this - notes history of glucose intolerance  . Hemiparesis and alteration of sensations as late effects of stroke ([place]) 09/07/2016  . History of pneumonia 1997  . S/P MVR (mitral valve replacement)    Mechanical mitral valve replacement at age 37 (done in [place])  // echo 7/17: EF 55-60, normal wall motion, bileaflet mechanical mitral valve prosthesis functioning normally, mild LAE, mildly reduced RVSF, small pericardial effusion  . Stroke [facility]) 1997, 2013, 2015    Past Surgical History:  Procedure Laterality Date  . ABDOMINAL HYSTERECTOMY    . BRAIN SURGERY    . BUNIONECTOMY  1993  . CARDIAC VALVE REPLACEMENT  1997  . TOE SURGERY  1996    There were no vitals filed for this visit.  Subjective Assessment - 10/23/19 0804    Subjective  Saw Janett Billow with [facility], no new orders. Does see Dr. Letta Pate on Friday, plans to discuss increased hip pain with him. No falls. Took extra tylenol today, pain is now 2/10, was 6/10 when she woke up.    How long can you sit comfortably?  1-2  min    Diagnostic tests  MRI showed old hemorrhagic infarct in the right parietal cortical and subcortical brain which had progressed atrophy, encephalomalacia.  Suspicion of minor acute extension along the anterior margin of the old infarction affecting the deep white matter and small focus adjacent cortical brain.    Currently in Pain?  Yes    Pain Score  2     Pain Location  Hip    Pain Orientation  Left    Pain Descriptors / Indicators  Aching;Sore;Sharp;Shooting   sharp, shooting pains at random times   Pain Type  Chronic pain    Pain Radiating Towards  thigh    Pain Onset  More than a month ago    Pain Frequency  Intermittent    Aggravating Factors   standing,    Pain Relieving Factors  bengay, tylenol, ice massage         [facility] PT Assessment - 10/23/19 0807      Timed Up and Go Test   Normal TUG (seconds)  22.22    TUG Comments  with cane           [facility] Adult PT Treatment/Exercise - 10/23/19 0807      Transfers   Transfers  Sit to Stand;Stand to Sit    Sit to Stand  5:  Supervision;With upper extremity assist;From chair/3-in-1;From bed;4: Min guard;4: Min assist;Without upper extremity assist    Stand to Sit  5: Supervision;With upper extremity assist;To bed;To chair/3-in-1      Ambulation/Gait   Ambulation/Gait  Yes    Ambulation/Gait Assistance  4: Min guard    Ambulation/Gait Assistance Details  cues for increased step length on left and increased stand time.     Ambulation Distance (Feet)  115 Feet   x1, plus around gym   Assistive device  Straight cane    Gait Pattern  Decreased step length - right;Decreased step length - left;Decreased stance time - left;Decreased hip/knee flexion - left;Decreased dorsiflexion - left;Decreased weight shift to left;Ataxic;Decreased arm swing - left    Ambulation Surface  Level;Indoor      Neuro Re-ed    Neuro Re-ed Details   for strengthening/muscle re-ed: seated at edge of mat worked on sit<>stand transfers with emphasis on  keeping left LE in neutral/preventing IR and emphasizing increased weight shifting onto the left LE. use of red band with cues to "keep it tight" for 5 reps with min/mod assist. in staggered stance with left foot back/right foot forward for 10 reps with assist/facilitation to shift and stay on the left LE.  mod assist needed.       Ultrasound   Ultrasound Location  left hip over greater trochanter area    Ultrasound Parameters  100%, 1 Mhz, 1.5 w/cm2 for 10 minutes    Ultrasound Goals  Pain          PT Short Term Goals - 10/23/19 1827      PT SHORT TERM GOAL #1   Title  Patient to be independent with initial HEP. (target for STG 4 weeks 10/19/19)    Baseline  10/17/19: met per pt and spouse report    Status  Achieved      PT SHORT TERM GOAL #2   Title  Pt will improve TUG to less than 20 sec    Baseline  10/23/19: 22.22 sec's with cane, improved from 25 sec's just not to goal.    Status  Partially Met      PT SHORT TERM GOAL #3   Title  Pt will improve BERG to more than 40    Baseline  10/17/19: 44/56 scored today    Status  Achieved        PT Long Term Goals - 09/19/19 1644      PT LONG TERM GOAL #1   Title  Patient to be independent with advanced HEP. (Target for LTG 12 weeks 12/12/19)    Time  12    Period  Weeks    Status  New      PT LONG TERM GOAL #2   Title  Pt will improve TUG to less than 17 seconds    Baseline  25    Time  12    Period  Weeks    Status  New      PT LONG TERM GOAL #3   Title  Patient to score <20 sec on 5xSTS without UE support to decrease risk of falls.    Baseline  38    Time  12    Period  Weeks    Status  New      PT LONG TERM GOAL #4   Title  Patient to improve gait speed to 2 ft/sec with LRAD    Baseline  1.66    Time  12  Period  Weeks    Status  New      PT LONG TERM GOAL #5   Title  Pt to ambulate limited community distances mod I at least 450 ft with LRAD and negotiate up/down one flight of stairs.    Time  12    Period   Weeks    Status  New      Additional Long Term Goals   Additional Long Term Goals  Yes      PT LONG TERM GOAL #6   Title  Pt will improve BERG to at least 45 to show improved balance    Baseline  33    Time  12    Period  Weeks    Status  New            Plan - 10/23/19 0807    Clinical Impression Statement  Today's skilled session continued to address left hip pain with ultrasound and by addressing transfer mechanics as pt tends to IR left LE when standing with decreased weight bearing through LE. Will need additional work on this. Also checked remaining STG partially met. The pt is making slow, steady progress toward goals and should benefit from continued PT to progress toward unmet goals.    Personal Factors and Comorbidities  Comorbidity 3+    Examination-Activity Limitations  Locomotion Level;Transfers;Reach Overhead;Carry;Squat;Stairs;Lift;Stand    Examination-Participation Restrictions  Meal Prep;Cleaning;Community Activity;Driving;Laundry;Shop    Stability/Clinical Decision Making  Evolving/Moderate complexity    Rehab Potential  Good    PT Frequency  2x / week    PT Duration  12 weeks    PT Treatment/Interventions  Passive range of motion;Iontophoresis '4mg'$ /ml Dexamethasone;Ultrasound;Electrical Stimulation;Cryotherapy;Manual techniques    PT Next Visit Plan  continue with modalities/manual therapy as needed to treat hip pain on left;  work on sit to stand technique keeping left LE in neutral as it tends to IR (could be cause of some of her hip pain); left LE strengthening targeting hip muscles when pain allows.    PT Home Exercise Plan  Access Code: [email or web address]    Consulted and Agree with Plan of Care  Patient       Patient will benefit from skilled therapeutic intervention in order to improve the following deficits and impairments:  Abnormal gait, Decreased activity tolerance, Decreased balance, Decreased coordination, Decreased endurance, Decreased mobility, Decreased  range of motion, Decreased strength, Difficulty walking, Impaired UE functional use, Postural dysfunction, Improper body mechanics  Visit Diagnosis: Unsteadiness on feet  Pain in left hip  Muscle weakness (generalized)  Cerebrovascular accident (CVA) due to other mechanism [facility])     Problem List Patient Active Problem List   Diagnosis Date Noted  . 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 Anticoagulated on warfarin   . Left hemiparesis ([place])   . History of CVA with residual deficit   . Thrombocytopenia ([place])   . Prediabetes   . CVA (cerebral vascular accident) ([place]) 09/03/2019  . Vertigo 09/24/2017  . Benign paroxysmal positional vertigo   . History of stroke   . Hyperlipemia 08/23/2017  . Encounter for therapeutic drug monitoring 08/08/2017  . Long term (current) use of anticoagulants [Z79.01] 07/12/2017  . Hemiparesis and alteration of sensations as late effects of stroke ([place]) 09/07/2016  . Abnormality of gait 09/07/2016  . HTN (hypertension) 04/13/2016  . H/O mitral valve replacement with mechanical valve 10/16/2015  . Diabetes mellitus without complication ([place])   . Hemiparesis (L sided - mild) due to old stroke ([place])  Willow Ora, PTA, [place] [address], [place], [place] [address] [phone] 10/23/19, 6:30 PM   Name: [name] MRN: [id number] Date of Birth: 1946-07-05

## 2019-10-23 NOTE — Therapy (Signed)
[facility] [address] [place], [place], [address] Phone: [phone]   Fax:  [phone]  Occupational Therapy Treatment  Patient Details  Name: [name] MRN: [id number] Date of Birth: 13-Sep-1946 Referring Provider (OT): Dr. Letta Pate   Encounter Date: 10/23/2019  OT End of Session - 10/23/19 0950    Visit Number  4    Number of Visits  16    Date for OT Re-Evaluation  11/28/19    Authorization Type  medicare will need PN every 10th visit    Authorization Time Period  90 days    Authorization - Visit Number  4    Authorization - Number of Visits  10    OT Start Time  0845    OT Stop Time  0930    OT Time Calculation (min)  45 min    Activity Tolerance  Patient tolerated treatment well    Behavior During Therapy  [facility] for tasks assessed/performed       Past Medical History:  Diagnosis Date  . Abnormality of gait 09/07/2016  . Allergy   . Diabetes mellitus without complication [facility])    Patient denies this - notes history of glucose intolerance  . Hemiparesis and alteration of sensations as late effects of stroke ([place]) 09/07/2016  . History of pneumonia 1997  . S/P MVR (mitral valve replacement)    Mechanical mitral valve replacement at age 60 (done in [place])  // echo 7/17: EF 55-60, normal wall motion, bileaflet mechanical mitral valve prosthesis functioning normally, mild LAE, mildly reduced RVSF, small pericardial effusion  . Stroke [facility]) 1997, 2013, 2015    Past Surgical History:  Procedure Laterality Date  . ABDOMINAL HYSTERECTOMY    . BRAIN SURGERY    . BUNIONECTOMY  1993  . CARDIAC VALVE REPLACEMENT  1997  . TOE SURGERY  1996    There were no vitals filed for this visit.  Subjective Assessment - 10/23/19 0851    Patient is accompanied by:  Family member    Pertinent History  09/02/2019 R CVA ( suspect extension of old infarct affecting deep white matter and small focus adjacent to  cortical brain.  PMH:  prior CVA's first in April of '97, August of 97, fall of 2014, spring of 2015.    Patient Stated Goals  I hope my hand will get better "I want it down and I know I am suppose to swing it more naturally when I walk.  Sometimes I walk into the door frame on my left.    Currently in Pain?  No/denies       LUE closed chain AA/ROM in high level sh flexion using UE Ranger - pt had difficulty w/ this due to proprioception, motor planning, processing, and spatial deficits. Pt did better w/ target to reach for. Adapted to closed chain AA/ROM BUE's for reciprocal and simultaneous movement, however noted pt even had trouble using RUE due to above deficits. Pt when asked to reach and grab for something high level RUE, pt had no problems. Pt could also do this low to mid level LUE w/ only min compensations.  Supine: BUE sh flexion holding thick foam dowel w/ cues to keep bilateral elbows straight and even (again pt demo decreased ability even w/ RUE) however improved w/ repetition.  Pt issued HEP for above ex in supine, but also issued safe functional tasks to perform w/ LUE as pt appears to perform better.  OT Education - 10/23/19 0930    Education Details  UE Ex and functional activities to perform LUE    Person(s) Educated  Patient;Spouse    Methods  Explanation;Demonstration;Verbal cues;Handout    Comprehension  Verbalized understanding;Returned demonstration;Verbal cues required       OT Short Term Goals - 10/15/19 1215      OT SHORT TERM GOAL #1   Title  Pt and husband will be mod I with HEP to address L grip strength and L coordination - 10/31/19 (date adjused as pt has just now returned for first treatment session)    Status  Achieved      OT SHORT TERM GOAL #2   Title  Pt will be mod I with cutting food on plate AE prn    Status  On-going   practiced in clinic w/ 2 options (rocker knife may be safest option)     OT SHORT TERM GOAL  #3   Title  Pt will demonstrate understanding of use of button hook for smaller buttons    Status  On-going   awaiting already ordered button hook     OT SHORT TERM GOAL #4   Title  Pt will be mod I with bathing back AE prn    Status  On-going      OT SHORT TERM GOAL #5   Title  Pt will be supervision for toilet tranfers    Status  Achieved   mod I level (per pt/family report)     OT SHORT TERM GOAL #6   Title  Pt will demonstrate improved grip strength by at least 4 pounds to assist with light housekeeping tasks (baseline = 22)    Status  On-going        OT Long Term Goals - 10/10/19 1028      OT LONG TERM GOAL #1   Title  Pt and husband will be mod I with HEP to address LUE strength/funtional use and improved activity tolerance. - 11/28/2019 (date adjusted as pt just now returning for first treatment session)    Status  On-going      OT LONG TERM GOAL #2   Title  Pt will be mod I with toilet transfers    Status  On-going      OT LONG TERM GOAL #3   Title  Pt will be mod I with simple snack prep at ambulatory level    Status  On-going      OT LONG TERM GOAL #4   Title  Pt will be mod I with laundry    Status  On-going      OT LONG TERM GOAL #5   Title  Pt will require no more than supervsion for short shopping trips (i.e. [employer], [employer])    Status  On-going      OT LONG TERM GOAL #6   Title  Pt will demonstrate improved grip strength by at least 6 pounds to assist with cold snack prep and light housekeeping (baseline = 22)    Status  On-going      OT LONG TERM GOAL #7   Title  Pt will demonstrate overhead reach with LUE to at least 110* of shoulder flexion with LUE to obtain light weight object in overhead cabinet    Status  On-going      OT LONG TERM GOAL #8   Title  Pt and husband will verbalize understanding of driving recommendations    Status  On-going  Plan - 10/23/19 0951    Clinical Impression Statement  Pt demo decreased  proprioception and kinesthesia, as well as apraxia, decreased mental processing and spatial awareness. Pt does best with functional tasks and reaching w/ targets    Occupational performance deficits (Please refer to evaluation for details):  ADL's;IADL's;Leisure;Social Participation    Body Structure / Function / Physical Skills  ADL;Balance;Coordination;Decreased knowledge of use of DME;Dexterity;IADL;GMC;FMC;Endurance;Mobility;Proprioception;ROM;Sensation;Strength;Tone;UE functional use    Rehab Potential  Good    OT Frequency  2x / week    OT Duration  8 weeks    OT Treatment/Interventions  Self-care/ADL training;Aquatic Therapy;Moist Heat;Therapeutic exercise;Neuromuscular education;DME and/or AE instruction;Passive range of motion;Manual Therapy;Therapist, nutritional;Therapeutic activities;Visual/perceptual remediation/compensation;Patient/family education;Balance training    Plan  continue functional use of LUE, functional mobility, discuss possible speech therapy services    Recommended Other Services  consider speech therapy services    Consulted and Agree with Plan of Care  Patient;Family member/caregiver    Family Member Consulted  husband       Patient will benefit from skilled therapeutic intervention in order to improve the following deficits and impairments:   Body Structure / Function / Physical Skills: ADL, Balance, Coordination, Decreased knowledge of use of DME, Dexterity, IADL, GMC, FMC, Endurance, Mobility, Proprioception, ROM, Sensation, Strength, Tone, UE functional use       Visit Diagnosis: Hemiplegia and hemiparesis following cerebral infarction affecting left non-dominant side (HCC)  Apraxia    Problem List Patient Active Problem List   Diagnosis Date Noted  . 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 Anticoagulated on warfarin   . Left hemiparesis ([place])   . History of CVA with residual deficit   . Thrombocytopenia ([place])   . Prediabetes   . CVA  (cerebral vascular accident) ([place]) 09/03/2019  . Vertigo 09/24/2017  . Benign paroxysmal positional vertigo   . History of stroke   . Hyperlipemia 08/23/2017  . Encounter for therapeutic drug monitoring 08/08/2017  . Long term (current) use of anticoagulants [Z79.01] 07/12/2017  . Hemiparesis and alteration of sensations as late effects of stroke ([place]) 09/07/2016  . Abnormality of gait 09/07/2016  . HTN (hypertension) 04/13/2016  . H/O mitral valve replacement with mechanical valve 10/16/2015  . Diabetes mellitus without complication ([place])   . Hemiparesis (L sided - mild) due to old stroke [facility])     Carey Bullocks, OTR/L 10/23/2019, 9:54 AM  [facility] [address] [place], [place], [address] Phone: [phone]   Fax:  [phone]  Name: [name] MRN: [id number] Date of Birth: 1946/07/28

## 2019-10-25 ENCOUNTER — Encounter: Payer: Self-pay | Admitting: Physical Therapy

## 2019-10-25 ENCOUNTER — Ambulatory Visit: Payer: Medicare Other | Admitting: Physical Therapy

## 2019-10-25 ENCOUNTER — Ambulatory Visit: Payer: Medicare Other | Admitting: Occupational Therapy

## 2019-10-25 ENCOUNTER — Other Ambulatory Visit: Payer: Self-pay

## 2019-10-25 DIAGNOSIS — I69354 Hemiplegia and hemiparesis following cerebral infarction affecting left non-dominant side: Secondary | ICD-10-CM

## 2019-10-25 DIAGNOSIS — M6281 Muscle weakness (generalized): Secondary | ICD-10-CM

## 2019-10-25 DIAGNOSIS — M25552 Pain in left hip: Secondary | ICD-10-CM

## 2019-10-25 DIAGNOSIS — R2681 Unsteadiness on feet: Secondary | ICD-10-CM

## 2019-10-26 ENCOUNTER — Encounter: Payer: Self-pay | Admitting: Physical Medicine & Rehabilitation

## 2019-10-26 ENCOUNTER — Ambulatory Visit
Admission: RE | Admit: 2019-10-26 | Discharge: 2019-10-26 | Disposition: A | Discharge status: Home / Self Care | Payer: Medicare Other | Source: Ambulatory Visit | Attending: Physical Medicine & Rehabilitation | Admitting: Physical Medicine & Rehabilitation

## 2019-10-26 ENCOUNTER — Encounter: Payer: Medicare Other | Attending: Registered Nurse | Admitting: Physical Medicine & Rehabilitation

## 2019-10-26 VITALS — BP 121/74 | HR 87 | Temp 97.9°F | Ht 67.5 in | Wt 126.0 lb

## 2019-10-26 DIAGNOSIS — M545 Low back pain, unspecified: Secondary | ICD-10-CM

## 2019-10-26 DIAGNOSIS — I639 Cerebral infarction, unspecified: Secondary | ICD-10-CM | POA: Diagnosis not present

## 2019-10-26 DIAGNOSIS — I10 Essential (primary) hypertension: Secondary | ICD-10-CM | POA: Insufficient documentation

## 2019-10-26 DIAGNOSIS — G8194 Hemiplegia, unspecified affecting left nondominant side: Secondary | ICD-10-CM | POA: Insufficient documentation

## 2019-10-26 DIAGNOSIS — I6930 Unspecified sequelae of cerebral infarction: Secondary | ICD-10-CM | POA: Insufficient documentation

## 2019-10-26 NOTE — Therapy (Signed)
[facility] [address] [place], [place], [address] Phone: [phone]   Fax:  [phone]  Physical Therapy Treatment  Patient Details  Name: [name] MRN: [id number] Date of Birth: 08/01/1946 Referring Provider (PT): [name], MD   Encounter Date: 10/25/2019  PT End of Session - 10/25/19 1539    Visit Number  7    Number of Visits  24    Date for PT Re-Evaluation  12/12/19    Authorization Type  MCR    PT Start Time  7741    PT Stop Time  1619    PT Time Calculation (min)  46 min    Equipment Utilized During Treatment  Gait belt    Activity Tolerance  Patient tolerated treatment well;No increased pain    Behavior During Therapy  WFL for tasks assessed/performed       Past Medical History:  Diagnosis Date  . Abnormality of gait 09/07/2016  . Allergy   . Diabetes mellitus without complication [facility])    Patient denies this - notes history of glucose intolerance  . Hemiparesis and alteration of sensations as late effects of stroke ([place]) 09/07/2016  . History of pneumonia 1997  . S/P MVR (mitral valve replacement)    Mechanical mitral valve replacement at age 63 (done in [place])  // echo 7/17: EF 55-60, normal wall motion, bileaflet mechanical mitral valve prosthesis functioning normally, mild LAE, mildly reduced RVSF, small pericardial effusion  . Stroke [facility]) 1997, 2013, 2015    Past Surgical History:  Procedure Laterality Date  . ABDOMINAL HYSTERECTOMY    . BRAIN SURGERY    . BUNIONECTOMY  1993  . CARDIAC VALVE REPLACEMENT  1997  . TOE SURGERY  1996    There were no vitals filed for this visit.  Subjective Assessment - 10/25/19 1536    Subjective  Still with increased left hip pain, worse at night and in am or when she increases her activity. 8/10 this am, 2/10 currently. Feels ultrasound helps in some areas, not  overall with no lasting relief.    Patient is accompained by:  Family  member   spouse   How long can you sit comfortably?  1-2 min    Diagnostic tests  MRI showed old hemorrhagic infarct in the right parietal cortical and subcortical brain which had progressed atrophy, encephalomalacia.  Suspicion of minor acute extension along the anterior margin of the old infarction affecting the deep white matter and small focus adjacent cortical brain.    Currently in Pain?  Yes    Pain Score  2     Pain Location  Hip    Pain Orientation  Left    Pain Descriptors / Indicators  Aching;Sore;Shooting    Pain Type  Chronic pain;Acute pain    Pain Radiating Towards  thigh    Pain Onset  More than a month ago    Pain Frequency  Intermittent    Aggravating Factors   increased standing, walking    Pain Relieving Factors  bengay, tylenol, ice massage           [facility] Adult PT Treatment/Exercise - 10/25/19 1540      Transfers   Transfers  Sit to Stand;Stand to Sit    Sit to Stand  5: Supervision;With upper extremity assist;From chair/3-in-1;From bed;4: Min guard;4: Min assist;Without upper extremity assist    Stand to Sit  5: Supervision;With upper extremity assist;To bed;To chair/3-in-1  Ambulation/Gait   Ambulation/Gait  Yes    Ambulation/Gait Assistance  4: Min guard    Ambulation/Gait Assistance Details  cues for step length, weight shifting adn increased left foot clearance. left foot catching x2 reps total with gait.     Ambulation Distance (Feet)  100 Feet   x2   Assistive device  Straight cane    Gait Pattern  Decreased step length - right;Decreased step length - left;Decreased stance time - left;Decreased hip/knee flexion - left;Decreased dorsiflexion - left;Decreased weight shift to left;Ataxic;Decreased arm swing - left    Ambulation Surface  Level;Indoor      Exercises   Exercises  Other Exercises    Other Exercises   in right sidelying: left LE clamshells for 20 reps, then straight leg raises for hip abduction for 10 reps. assist/cues for correct form  and technique.       Modalities   Modalities  Iontophoresis      Ultrasound   Ultrasound Goals  Pain      Iontophoresis   Type of Iontophoresis  Dexamethasone    Location  left hip over greater trochanter    Dose  2 cc    Time  15-16 minutes            PT Short Term Goals - 10/23/19 1827      PT SHORT TERM GOAL #1   Title  Patient to be independent with initial HEP. (target for STG 4 weeks 10/19/19)    Baseline  10/17/19: met per pt and spouse report    Status  Achieved      PT SHORT TERM GOAL #2   Title  Pt will improve TUG to less than 20 sec    Baseline  10/23/19: 22.22 sec's with cane, improved from 25 sec's just not to goal.    Status  Partially Met      PT SHORT TERM GOAL #3   Title  Pt will improve BERG to more than 40    Baseline  10/17/19: 44/56 scored today    Status  Achieved        PT Long Term Goals - 09/19/19 1644      PT LONG TERM GOAL #1   Title  Patient to be independent with advanced HEP. (Target for LTG 12 weeks 12/12/19)    Time  12    Period  Weeks    Status  New      PT LONG TERM GOAL #2   Title  Pt will improve TUG to less than 17 seconds    Baseline  25    Time  12    Period  Weeks    Status  New      PT LONG TERM GOAL #3   Title  Patient to score <20 sec on 5xSTS without UE support to decrease risk of falls.    Baseline  38    Time  12    Period  Weeks    Status  New      PT LONG TERM GOAL #4   Title  Patient to improve gait speed to 2 ft/sec with LRAD    Baseline  1.66    Time  12    Period  Weeks    Status  New      PT LONG TERM GOAL #5   Title  Pt to ambulate limited community distances mod I at least 450 ft with LRAD and negotiate up/down one flight of stairs.      Time  12    Period  Weeks    Status  New      Additional Long Term Goals   Additional Long Term Goals  Yes      PT LONG TERM GOAL #6   Title  Pt will improve BERG to at least 45 to show improved balance    Baseline  33    Time  12    Period  Weeks     Status  New            Plan - 10/25/19 1539    Clinical Impression Statement  Today's skilled session utilized Iontophoresis for pain/inflamation reduction on left hip. No issues reported with treatment with skin intact afterwards. Continued to address hip strengthening and gait as well. Pt reporting on pain after Ionto treatment, just a "tight" feeling in her thigh. The pt is progressing toward goals and should benefit from continued PT to progress toward unmet goals.    Personal Factors and Comorbidities  Comorbidity 3+    Examination-Activity Limitations  Locomotion Level;Transfers;Reach Overhead;Carry;Squat;Stairs;Lift;Stand    Examination-Participation Restrictions  Meal Prep;Cleaning;Community Activity;Driving;Laundry;Shop    Stability/Clinical Decision Making  Evolving/Moderate complexity    Rehab Potential  Good    PT Frequency  2x / week    PT Duration  12 weeks    PT Treatment/Interventions  Passive range of motion;Iontophoresis 4mg/ml Dexamethasone;Ultrasound;Electrical Stimulation;Cryotherapy;Manual techniques    PT Next Visit Plan  did Ionto help with pain? What did Dr. Kirsteins say at Friday's visist;  work on sit to stand technique keeping left LE in neutral as it tends to IR (could be cause of some of her hip pain); left LE strengthening targeting hip muscles when pain allows.    PT Home Exercise Plan  Access Code: [email or web address]    Consulted and Agree with Plan of Care  Patient       Patient will benefit from skilled therapeutic intervention in order to improve the following deficits and impairments:  Abnormal gait, Decreased activity tolerance, Decreased balance, Decreased coordination, Decreased endurance, Decreased mobility, Decreased range of motion, Decreased strength, Difficulty walking, Impaired UE functional use, Postural dysfunction, Improper body mechanics  Visit Diagnosis: Hemiplegia and hemiparesis following cerebral infarction affecting left non-dominant side  (HCC)  Unsteadiness on feet  Pain in left hip  Muscle weakness (generalized)     Problem List Patient Active Problem List   Diagnosis Date Noted  . Subcortical infarction (HCC) 09/05/2019  . Anticoagulated on warfarin   . Left hemiparesis (HCC)   . History of CVA with residual deficit   . Thrombocytopenia (HCC)   . Prediabetes   . CVA (cerebral vascular accident) (HCC) 09/03/2019  . Vertigo 09/24/2017  . Benign paroxysmal positional vertigo   . History of stroke   . Hyperlipemia 08/23/2017  . Encounter for therapeutic drug monitoring 08/08/2017  . Long term (current) use of anticoagulants [Z79.01] 07/12/2017  . 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) 09/07/2016  . Abnormality of gait 09/07/2016  . HTN (hypertension) 04/13/2016  . H/O mitral valve replacement with mechanical valve 10/16/2015  . Diabetes mellitus without complication (HCC)   . Hemiparesis (L sided - mild) due to old stroke (HCC)      , PTA, CLT [facility] [address], [address] [place], [place] [address] [phone] 10/26/19, 9:59 AM   Name: [name] MRN: [id number] Date of Birth: 01/20/1946   

## 2019-10-26 NOTE — Progress Notes (Signed)
Subjective:    Patient ID: [name], female    DOB: [birth year], 73 y.o.   MRN: [id number]  HPI  73 year old female with history of mechanical mitral valve replacement and recurrent right-sided cerebral infarcts dating back to 1997.  Patient maintained on warfarin her most recent extension of stroke occurred 09/03/2019 with worsening of left-sided weakness.  Patient was at inpatient rehab from 09/05/2019 till 09/15/2019 LLE pain worse with standing from the lateral hip to the lateral aspect of the Left thigh Pt used to walk with husband and had issues with this even prior to the last stroke.  Denies numbness in Left thigh Takes 2 tylenol and rubs Ben gay on the area daily, gets better until the afternoon No lancinating pain down the left lower extremity there is more of an ache in the thigh. Pain Inventory Average Pain 4 Pain Right Now 7 My pain is sharp, stabbing and aching  In the last 24 hours, has pain interfered with the following? General activity 1 Relation with others 0 Enjoyment of life 0 What TIME of day is your pain at its worst? night Sleep (in general) Good  Pain is worse with: bending, sitting and standing Pain improves with: heat/ice, therapy/exercise, medication and otc meds Relief from Meds: 6  Mobility walk without assistance use a cane ability to climb steps?  yes do you drive?  no  Function retired  Neuro/Psych No problems in this area  Prior Studies Any changes since last visit?  no  Physicians involved in your care Any changes since last visit?  no   Family History  Problem Relation Age of Onset  . Cancer Mother        Bone  . Heart disease Mother   . Hyperlipidemia Mother   . Hypertension Mother   . Stroke Father   . Hypertension Father   . Heart attack Neg Hx    Social History   Socioeconomic History  . Marital status: Married    Spouse name: Not on file  . Number of children: 2  . 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  Highest education level: Not on file  Occupational History  . Occupation: Retired  Tobacco Use  . Smoking status: Never Smoker  . Smokeless tobacco: Never Used  Substance and Sexual Activity  . Alcohol use: No    Alcohol/week: 0.0 standard drinks  . Drug use: No  . Sexual activity: Not on file    Comment: Married  Other Topics Concern  . Not on file  Social History Narrative   Lives at home w/ her husband   Right-handed   Caffeine: none   Social Determinants of Health   Financial Resource Strain:   . Difficulty of Paying Living Expenses: Not on file  Food Insecurity:   . Worried About Charity fundraiser in the Last Year: Not on file  . Ran Out of Food in the Last Year: Not on file  Transportation Needs:   . Lack of Transportation (Medical): Not on file  . Lack of Transportation (Non-Medical): Not on file  Physical Activity:   . Days of Exercise per Week: Not on file  . Minutes of Exercise per Session: Not on file  Stress:   . Feeling of Stress : Not on file  Social Connections:   . Frequency of Communication with Friends and Family: Not on file  . Frequency of Social Gatherings with Friends and Family: Not on file  . Attends  Religious Services: Not on file  . Active Member of Clubs or Organizations: Not on file  . Attends Archivist Meetings: Not on file  . Marital Status: Not on file   Past Surgical History:  Procedure Laterality Date  . ABDOMINAL HYSTERECTOMY    . BRAIN SURGERY    . BUNIONECTOMY  1993  . CARDIAC VALVE REPLACEMENT  1997  . TOE SURGERY  1996   Past Medical History:  Diagnosis Date  . Abnormality of gait 09/07/2016  . Allergy   . Diabetes mellitus without complication [facility])    Patient denies this - notes history of glucose intolerance  . Hemiparesis and alteration of sensations as late effects of stroke ([place]) 09/07/2016  . History of pneumonia 1997  . S/P MVR (mitral valve replacement)    Mechanical mitral valve replacement at age  73 (done in [place])  // echo 7/17: EF 55-60, normal wall motion, bileaflet mechanical mitral valve prosthesis functioning normally, mild LAE, mildly reduced RVSF, small pericardial effusion  . Stroke ([place]) 1997, 2013, 2015   BP 121/74   Pulse 87   Temp 97.9 F (36.6 C)   Ht 5' 7.5" (1.715 m)   Wt 126 lb (57.2 kg)   SpO2 97%   BMI 19.44 kg/m   Opioid Risk Score:   Fall Risk Score:  `1  Depression screen PHQ 2/9  Depression screen [facility] 2/9 10/22/2019 09/25/2019 07/03/2015  Decreased Interest 0 0 0  Down, Depressed, Hopeless 0 0 0  PHQ - 2 Score 0 0 0     Review of Systems  Constitutional: Negative.   HENT: Negative.   Eyes: Negative.   Respiratory: Negative.   Cardiovascular: Negative.   Gastrointestinal: Negative.   Endocrine: Negative.   Genitourinary: Negative.   Musculoskeletal: Positive for arthralgias and gait problem.  Skin: Negative.   Allergic/Immunologic: Negative.   Hematological: Negative.   Psychiatric/Behavioral: Negative.   All other systems reviewed and are negative.      Objective:   Physical Exam Vitals and nursing note reviewed.  Constitutional:      Appearance: Normal appearance.  HENT:     Head: Normocephalic and atraumatic.  Eyes:     Extraocular Movements: Extraocular movements intact.     Conjunctiva/sclera: Conjunctivae normal.     Pupils: Pupils are equal, round, and reactive to light.  Neurological:     Mental Status: She is alert.     Cranial Nerves: No dysarthria or facial asymmetry.     Sensory: Sensory deficit present.     Motor: Weakness present.     Coordination: Coordination abnormal. Finger-Nose-Finger Test abnormal and Heel to [facility] Test abnormal.     Gait: Gait abnormal.     Deep Tendon Reflexes:     Reflex Scores:      Tricep reflexes are 1+ on the right side and 3+ on the left side.      Bicep reflexes are 1+ on the right side and 3+ on the left side.      Brachioradialis reflexes are 1+ on the right side and 3+ on  the left side.      Patellar reflexes are 1+ on the right side and 3+ on the left side.      Achilles reflexes are 1+ on the right side and 3+ on the left side.    Comments: Motor strength is 5/5 in the right deltoid, bicep, tricep, grip as well as right hip flexor knee extensor ankle dorsiflexor 3 - at the  left deltoid bicep tricep finger flexors and extensors Left lower extremity 3 - hip flexors 4 at the hip adductor's 3+ hip abductor's, 4/5 left knee extensors, 0 at the ankle dorsiflexors wearing left AFO Sensation is reduced to light touch in the left upper limb and left lower limb. There is evidence of sensory ataxia in left upper extremity and left lower extremity  Psychiatric:        Mood and Affect: Mood normal.        Behavior: Behavior normal.        Thought Content: Thought content normal.        Judgment: Judgment normal.           Assessment & Plan:  #1.  History of recurrent cortical and subcortical infarcts with chronic left hemiparesis left hemisensory deficits as well as coordination deficits.  She will continue outpatient therapy PT OT to enhance the recovery from her most recent infarct. 2.  Mitral valve replacement on chronic Coumadin follow-up with PCP cardiology for anticoagulation management. 3.  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  Recommend left heel lift. We will check x-rays of the lumbar spine as well These were reviewed marked levoscoliosis centered at L2-3 There is loss of disc height at this level as well there is evidence of facet arthropathy left side greater than right side at L4-5 and L5-S1 Will discuss at next visit.  May benefit from lumbar medial branch blocks

## 2019-10-26 NOTE — Patient Instructions (Signed)
Xray of Lumbar spine [facility] [address]  Heel lift for Left shoe , try this in PT

## 2019-10-29 ENCOUNTER — Other Ambulatory Visit: Payer: Self-pay

## 2019-10-29 ENCOUNTER — Encounter: Payer: Self-pay | Admitting: Occupational Therapy

## 2019-10-29 ENCOUNTER — Encounter: Payer: Self-pay | Admitting: Physical Therapy

## 2019-10-29 ENCOUNTER — Ambulatory Visit (INDEPENDENT_AMBULATORY_CARE_PROVIDER_SITE_OTHER): Payer: Medicare Other | Admitting: Cardiovascular Disease

## 2019-10-29 ENCOUNTER — Ambulatory Visit: Payer: Medicare Other | Admitting: Occupational Therapy

## 2019-10-29 ENCOUNTER — Ambulatory Visit: Payer: Medicare Other | Admitting: Physical Therapy

## 2019-10-29 DIAGNOSIS — I69354 Hemiplegia and hemiparesis following cerebral infarction affecting left non-dominant side: Secondary | ICD-10-CM

## 2019-10-29 DIAGNOSIS — R482 Apraxia: Secondary | ICD-10-CM

## 2019-10-29 DIAGNOSIS — M6281 Muscle weakness (generalized): Secondary | ICD-10-CM

## 2019-10-29 DIAGNOSIS — Z5181 Encounter for therapeutic drug level monitoring: Secondary | ICD-10-CM | POA: Diagnosis not present

## 2019-10-29 DIAGNOSIS — R293 Abnormal posture: Secondary | ICD-10-CM

## 2019-10-29 DIAGNOSIS — Z952 Presence of prosthetic heart valve: Secondary | ICD-10-CM | POA: Diagnosis not present

## 2019-10-29 DIAGNOSIS — I69359 Hemiplegia and hemiparesis following cerebral infarction affecting unspecified side: Secondary | ICD-10-CM

## 2019-10-29 DIAGNOSIS — Z7901 Long term (current) use of anticoagulants: Secondary | ICD-10-CM

## 2019-10-29 DIAGNOSIS — R2681 Unsteadiness on feet: Secondary | ICD-10-CM | POA: Diagnosis not present

## 2019-10-29 DIAGNOSIS — R29818 Other symptoms and signs involving the nervous system: Secondary | ICD-10-CM

## 2019-10-29 DIAGNOSIS — M25552 Pain in left hip: Secondary | ICD-10-CM

## 2019-10-29 DIAGNOSIS — M25612 Stiffness of left shoulder, not elsewhere classified: Secondary | ICD-10-CM

## 2019-10-29 LAB — POCT INR: INR: 3.2 — AB (ref 2.0–3.0)

## 2019-10-29 NOTE — Therapy (Signed)
[facility] [address] [place], [place], [address] Phone: [phone]   Fax:  [phone]  Physical Therapy Treatment  Patient Details  Name: [name] MRN: [id number] Date of Birth: 1946/10/30 Referring Provider (PT): [name], MD   Encounter Date: 10/29/2019  PT End of Session - 10/29/19 1022    Visit Number  8    Number of Visits  24    Date for PT Re-Evaluation  12/12/19    Authorization Type  MCR    PT Start Time  1018    PT Stop Time  1100    PT Time Calculation (min)  42 min    Equipment Utilized During Treatment  Gait belt    Activity Tolerance  Patient tolerated treatment well;No increased pain    Behavior During Therapy  WFL for tasks assessed/performed       Past Medical History:  Diagnosis Date  . Abnormality of gait 09/07/2016  . Allergy   . Diabetes mellitus without complication [facility])    Patient denies this - notes history of glucose intolerance  . Hemiparesis and alteration of sensations as late effects of stroke ([place]) 09/07/2016  . History of pneumonia 1997  . S/P MVR (mitral valve replacement)    Mechanical mitral valve replacement at age 9 (done in [place])  // echo 7/17: EF 55-60, normal wall motion, bileaflet mechanical mitral valve prosthesis functioning normally, mild LAE, mildly reduced RVSF, small pericardial effusion  . Stroke [facility]) 1997, 2013, 2015    Past Surgical History:  Procedure Laterality Date  . ABDOMINAL HYSTERECTOMY    . BRAIN SURGERY    . BUNIONECTOMY  1993  . CARDIAC VALVE REPLACEMENT  1997  . TOE SURGERY  1996    There were no vitals filed for this visit.  Subjective Assessment - 10/29/19 1020    Subjective  Felt great Friday afternoon after last session. Left hip pain returned late afternoon on Saturday a little bit, and really came back yesterday. No falls.    Patient is accompained by:  Family member   spouse   How long can you sit  comfortably?  1-2 min    Diagnostic tests  MRI showed old hemorrhagic infarct in the right parietal cortical and subcortical brain which had progressed atrophy, encephalomalacia.  Suspicion of minor acute extension along the anterior margin of the old infarction affecting the deep white matter and small focus adjacent cortical brain.    Pain Score  2     Pain Location  Hip    Pain Orientation  Left    Pain Descriptors / Indicators  Aching;Other (Comment)   sensitivity on left thigh   Pain Type  Chronic pain    Pain Onset  More than a month ago    Pain Frequency  Intermittent    Aggravating Factors   increased standing, walking,    Pain Relieving Factors  bengay, tylenol, ice massage         [facility] PT Assessment - 10/29/19 1033      Timed Up and Go Test   Normal TUG (seconds)  22.17    TUG Comments  with cane and heel wedge added to left shoe         [facility] Adult PT Treatment/Exercise - 10/29/19 1033      Transfers   Transfers  Sit to Stand;Stand to Sit    Sit to Stand  5: Supervision;With upper extremity assist;From chair/3-in-1;From bed;4: Min guard;4: Min assist;Without  upper extremity assist    Stand to Sit  5: Supervision;With upper extremity assist;To bed;To chair/3-in-1      Ambulation/Gait   Ambulation/Gait  Yes    Ambulation/Gait Assistance  5: Supervision    Ambulation/Gait Assistance Details  added heel wedge to left shoe with pelvic height equal on check with smaller lift vs thicker lift originally tried. Gait around track with cane/heel lift with pt reported decreased pain/discomfort with gait. Heel wedge left in shoe. Pt and spouse advised to remove wedge if her pain gets worse or new pains/discomforts occur. Both were able to verbalize understanding.     Ambulation Distance (Feet)  115 Feet   x2, plus around gym with session   Assistive device  Straight cane    Gait Pattern  Decreased step length - right;Decreased step length - left;Decreased stance time -  left;Decreased hip/knee flexion - left;Decreased dorsiflexion - left;Decreased weight shift to left;Ataxic;Decreased arm swing - left    Ambulation Surface  Level;Indoor      Iontophoresis   Type of Iontophoresis  Dexamethasone    Location  left hip over greater trochanter    Dose  2 cc    Time  15-16 minutes                 PT Short Term Goals - 10/23/19 1827      PT SHORT TERM GOAL #1   Title  Patient to be independent with initial HEP. (target for STG 4 weeks 10/19/19)    Baseline  10/17/19: met per pt and spouse report    Status  Achieved      PT SHORT TERM GOAL #2   Title  Pt will improve TUG to less than 20 sec    Baseline  10/23/19: 22.22 sec's with cane, improved from 25 sec's just not to goal.    Status  Partially Met      PT SHORT TERM GOAL #3   Title  Pt will improve BERG to more than 40    Baseline  10/17/19: 44/56 scored today    Status  Achieved        PT Long Term Goals - 09/19/19 1644      PT LONG TERM GOAL #1   Title  Patient to be independent with advanced HEP. (Target for LTG 12 weeks 12/12/19)    Time  12    Period  Weeks    Status  New      PT LONG TERM GOAL #2   Title  Pt will improve TUG to less than 17 seconds    Baseline  25    Time  12    Period  Weeks    Status  New      PT LONG TERM GOAL #3   Title  Patient to score <20 sec on 5xSTS without UE support to decrease risk of falls.    Baseline  38    Time  12    Period  Weeks    Status  New      PT LONG TERM GOAL #4   Title  Patient to improve gait speed to 2 ft/sec with LRAD    Baseline  1.66    Time  12    Period  Weeks    Status  New      PT LONG TERM GOAL #5   Title  Pt to ambulate limited community distances mod I at least 450 ft with LRAD and negotiate up/down one flight  of stairs.    Time  12    Period  Weeks    Status  New      Additional Long Term Goals   Additional Long Term Goals  Yes      PT LONG TERM GOAL #6   Title  Pt will improve BERG to at least 45 to  show improved balance    Baseline  33    Time  12    Period  Weeks    Status  New            Plan - 10/29/19 1033    Clinical Impression Statement  Today's skilled session initially focused on use of heel wedge on left side to obtain more level pelvis. Pt did report less pain/discomfort with gait with heel wedge. No significant difference in her timed up and go with use of heel wedge noted with testing today. Continued with use of Ionto as pt reports having good pain relief response to the treatment last session. The pt was advised to remove the lift if her pain increases or new issues occur. The pt and spouse both verbalized understanding. The pt is making steady progress toward goals and should benefit from continued PT to progress toward unmet goals.    Personal Factors and Comorbidities  Comorbidity 3+    Examination-Activity Limitations  Locomotion Level;Transfers;Reach Overhead;Carry;Squat;Stairs;Lift;Stand    Examination-Participation Restrictions  Meal Prep;Cleaning;Community Activity;Driving;Laundry;Shop    Stability/Clinical Decision Making  Evolving/Moderate complexity    Rehab Potential  Good    PT Frequency  2x / week    PT Duration  12 weeks    PT Treatment/Interventions  Passive range of motion;Iontophoresis 4m/ml Dexamethasone;Ultrasound;Electrical Stimulation;Cryotherapy;Manual techniques    PT Next Visit Plan  how is the heel wedge working? how is her hip pain? work on sit to stand technique keeping left LE in neutral as it tends to IR (could be cause of some of her hip pain); left LE strengthening targeting hip muscles when pain allows.    PT Home Exercise Plan  Access Code: [email or web address]    Consulted and Agree with Plan of Care  Patient       Patient will benefit from skilled therapeutic intervention in order to improve the following deficits and impairments:  Abnormal gait, Decreased activity tolerance, Decreased balance, Decreased coordination, Decreased endurance,  Decreased mobility, Decreased range of motion, Decreased strength, Difficulty walking, Impaired UE functional use, Postural dysfunction, Improper body mechanics  Visit Diagnosis: Hemiplegia and hemiparesis following cerebral infarction affecting left non-dominant side (HCC)  Unsteadiness on feet  Pain in left hip  Muscle weakness (generalized)     Problem List Patient Active Problem List   Diagnosis Date Noted  . Subcortical infarction ([facility] 09/05/2019  . Anticoagulated on warfarin   . Left hemiparesis ([facility]   . History of CVA with residual deficit   . Thrombocytopenia ([facility]   . Prediabetes   . CVA (cerebral vascular accident) ([facility] [place] 09/03/2019  . Vertigo 09/24/2017  . Benign paroxysmal positional vertigo   . History of stroke   . Hyperlipemia 08/23/2017  . Encounter for therapeutic drug monitoring 08/08/2017  . Long term (current) use of anticoagulants [Z79.01] 07/12/2017  . Hemiparesis and alteration of sensations as late effects of stroke ([facility] 09/07/2016  . Abnormality of gait 09/07/2016  . HTN (hypertension) 04/13/2016  . H/O mitral valve replacement with mechanical valve 10/16/2015  . Diabetes mellitus without complication ([facility]   . Hemiparesis (L sided - mild) due to old stroke (  [place])     Willow Ora, PTA, Veteran [address] [place], [place] [address] [phone] 10/29/19, 10:56 PM   Name: [name] MRN: [id number] Date of Birth: 10-Mar-1946

## 2019-10-29 NOTE — Therapy (Signed)
[facility] [address] [place], [place], [address] Phone: [phone]   Fax:  [phone]  Occupational Therapy Treatment  Patient Details  Name: [name] MRN: [id number] Date of Birth: 1945-11-18 Referring Provider (OT): Dr. Letta Pate   Encounter Date: 10/29/2019  OT End of Session - 10/29/19 1209    Visit Number  5    Number of Visits  16    Date for OT Re-Evaluation  11/28/19    Authorization Type  medicare will need PN every 10th visit    Authorization Time Period  90 days    Authorization - Visit Number  5    Authorization - Number of Visits  10    OT Start Time  0933    OT Stop Time  1015    OT Time Calculation (min)  42 min    Activity Tolerance  Patient tolerated treatment well       Past Medical History:  Diagnosis Date  . Abnormality of gait 09/07/2016  . Allergy   . Diabetes mellitus without complication [facility])    Patient denies this - notes history of glucose intolerance  . Hemiparesis and alteration of sensations as late effects of stroke ([place]) 09/07/2016  . History of pneumonia 1997  . S/P MVR (mitral valve replacement)    Mechanical mitral valve replacement at age 60 (done in [place])  // echo 7/17: EF 55-60, normal wall motion, bileaflet mechanical mitral valve prosthesis functioning normally, mild LAE, mildly reduced RVSF, small pericardial effusion  . Stroke [facility]) 1997, 2013, 2015    Past Surgical History:  Procedure Laterality Date  . ABDOMINAL HYSTERECTOMY    . BRAIN SURGERY    . BUNIONECTOMY  1993  . CARDIAC VALVE REPLACEMENT  1997  . TOE SURGERY  1996    There were no vitals filed for this visit.  Subjective Assessment - 10/29/19 0937    Subjective   My shoulder feels tight    Pertinent History  09/02/2019 R CVA ( suspect extension of old infarct affecting deep white matter and small focus adjacent to cortical brain.  PMH:  prior CVA's first in April of '97, August of 97,  fall of 2014, spring of 2015.    Patient Stated Goals  I hope my hand will get better "I want it down and I know I am suppose to swing it more naturally when I walk.  Sometimes I walk into the door frame on my left.    Currently in Pain?  Yes    Pain Score  1     Pain Location  Leg    Pain Orientation  Left    Pain Descriptors / Indicators  Aching;Other (Comment)   sensivitty in L thigh in front   Pain Type  Chronic pain    Pain Onset  More than a month ago    Pain Frequency  Intermittent    Aggravating Factors   increased standing, walking    Pain Relieving Factors  bengay, tylenol, ice massage                   OT Treatments/Exercises (OP) - 10/29/19 1201      ADLs   ADL Comments  assessed goals - see goal section for updates      Neurological Re-education Exercises   Other Exercises 1  Neuro re ed in standing to address trunk control, postural alignment and LUE overhead reach with weight shifted to LLE through forced paradaigm.  Pt demonstrates influence of flexor tone synergy with overhead reach however is able to improve reach pattern via using vision to compensate for impaired sensation and apraxia.  Ugraded HEP to allow pt to use cane for chest presses and overhead reach in closed chain with cues to turn head to L and look at LUE in order to decrease tone, improve attention to LUE and assist with motor planning. Pt able to return demonstratre and husband able to cue prn.               OT Education - 10/29/19 1205    Education Details  modifid HEP to use cane for exercises and use of head turn to LUE to decrease tonal influence and increase attention to LUE and improve motor planning.    Person(s) Educated  Patient;Spouse    Methods  Explanation;Demonstration;Verbal cues    Comprehension  Verbalized understanding;Returned demonstration;Verbal cues required       OT Short Term Goals - 10/29/19 1206      OT SHORT TERM GOAL #1   Title  Pt and husband will be  mod I with HEP to address L grip strength and L coordination - 10/31/19 (date adjused as pt has just now returned for first treatment session)    Status  Achieved      OT SHORT TERM GOAL #2   Title  Pt will be mod I with cutting food on plate AE prn    Status  Achieved   pt and husband to order rocker knife from [place] #3   Title  Pt will demonstrate understanding of use of button hook for smaller buttons    Status  On-going   awaiting already ordered button hook     OT SHORT TERM GOAL #4   Title  Pt will be mod I with bathing back AE prn    Status  Achieved      OT SHORT TERM GOAL #5   Title  Pt will be supervision for toilet tranfers    Status  Achieved   mod I level (per pt/family report)     OT SHORT TERM GOAL #6   Title  Pt will demonstrate improved grip strength by at least 4 pounds to assist with light housekeeping tasks (baseline = 22)    Status  Achieved   LUE = 29       OT Long Term Goals - 10/29/19 1207      OT LONG TERM GOAL #1   Title  Pt and husband will be mod I with HEP to address LUE strength/funtional use and improved activity tolerance. - 11/28/2019 (date adjusted as pt just now returning for first treatment session)    Status  On-going      OT LONG TERM GOAL #2   Title  Pt will be mod I with toilet transfers    Status  Achieved      OT LONG TERM GOAL #3   Title  Pt will be mod I with simple snack prep at ambulatory level    Status  Achieved      OT LONG TERM GOAL #4   Title  Pt will be mod I with laundry    Status  Achieved      OT LONG TERM GOAL #5   Title  Pt will require no more than supervsion for short shopping trips (i.e. [employer], [employer])    Status  Achieved  OT LONG TERM GOAL #6   Title  Pt will demonstrate improved grip strength by at least 6 pounds to assist with cold snack prep and light housekeeping (baseline = 22)    Status  Achieved   LUE = 29 pounds     OT LONG TERM GOAL #7   Title  Pt will  demonstrate overhead reach with LUE to at least 110* of shoulder flexion with LUE to obtain light weight object in overhead cabinet    Status  On-going      OT LONG TERM GOAL #8   Title  Pt and husband will verbalize understanding of driving recommendations    Status  On-going            Plan - 10/29/19 1207    Clinical Impression Statement  Pt with good progress toward goals. Pt and husband report that pt is using LUE more at home functionally    OT Occupational Profile and History  Detailed Assessment- Review of Records and additional review of physical, cognitive, psychosocial history related to current functional performance    Occupational performance deficits (Please refer to evaluation for details):  ADL's;IADL's;Leisure;Social Participation    Body Structure / Function / Physical Skills  ADL;Balance;Coordination;Decreased knowledge of use of DME;Dexterity;IADL;GMC;FMC;Endurance;Mobility;Proprioception;ROM;Sensation;Strength;Tone;UE functional use    Rehab Potential  Good    Clinical Decision Making  Several treatment options, min-mod task modification necessary    Comorbidities Affecting Occupational Performance:  Presence of comorbidities impacting occupational performance    Comorbidities impacting occupational performance description:  see above PMH    Modification or Assistance to Complete Evaluation   Min-Moderate modification of tasks or assist with assess necessary to complete eval    OT Frequency  2x / week    OT Duration  8 weeks    OT Treatment/Interventions  Self-care/ADL training;Aquatic Therapy;Moist Heat;Therapeutic exercise;Neuromuscular education;DME and/or AE instruction;Passive range of motion;Manual Therapy;Therapist, nutritional;Therapeutic activities;Visual/perceptual remediation/compensation;Patient/family education;Balance training    Plan  continue functional use of LUE, NMR for LUE for overhead reach/trunk, postural alignment and control,     Consulted and Agree with Plan of Care  Patient;Family member/caregiver    Family Member Consulted  husband       Patient will benefit from skilled therapeutic intervention in order to improve the following deficits and impairments:   Body Structure / Function / Physical Skills: ADL, Balance, Coordination, Decreased knowledge of use of DME, Dexterity, IADL, GMC, FMC, Endurance, Mobility, Proprioception, ROM, Sensation, Strength, Tone, UE functional use       Visit Diagnosis: Hemiplegia and hemiparesis following cerebral infarction affecting left non-dominant side (HCC)  Unsteadiness on feet  Muscle weakness (generalized)  Apraxia  Abnormal posture  Other symptoms and signs involving the nervous system  Stiffness of left shoulder joint    Problem List Patient Active Problem List   Diagnosis Date Noted  . 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 Anticoagulated on warfarin   . Left hemiparesis ([place])   . History of CVA with residual deficit   . Thrombocytopenia ([place])   . Prediabetes   . CVA (cerebral vascular accident) ([place]) 09/03/2019  . Vertigo 09/24/2017  . Benign paroxysmal positional vertigo   . History of stroke   . Hyperlipemia 08/23/2017  . Encounter for therapeutic drug monitoring 08/08/2017  . Long term (current) use of anticoagulants [Z79.01] 07/12/2017  . Hemiparesis and alteration of sensations as late effects of stroke ([place]) 09/07/2016  . Abnormality of gait 09/07/2016  . HTN (hypertension) 04/13/2016  . H/O mitral  valve replacement with mechanical valve 10/16/2015  . Diabetes mellitus without complication ([place])   . Hemiparesis (L sided - mild) due to old stroke [facility])     Quay Burow, OTR/L 10/29/2019, 12:11 PM  [facility] [address] [place], [place], [address] Phone: [phone]   Fax:  [phone]  Name: [name] MRN: [id number] Date of Birth: Jul 25, 1946

## 2019-10-29 NOTE — Patient Instructions (Signed)
Description   Spoke with pt and instructed pt to continue taking 5mg  daily except 7.5mg  on Mondays and Fridays. Pt will continue greens with 2 cups of lettuce on Fridays. Does weekly checks due to insurance.

## 2019-10-31 ENCOUNTER — Ambulatory Visit: Payer: Medicare Other | Admitting: Occupational Therapy

## 2019-10-31 ENCOUNTER — Encounter: Payer: Self-pay | Admitting: Physical Therapy

## 2019-10-31 ENCOUNTER — Ambulatory Visit: Payer: Medicare Other | Admitting: Physical Therapy

## 2019-10-31 ENCOUNTER — Encounter: Payer: Self-pay | Admitting: Occupational Therapy

## 2019-10-31 ENCOUNTER — Other Ambulatory Visit: Payer: Self-pay

## 2019-10-31 DIAGNOSIS — R2681 Unsteadiness on feet: Secondary | ICD-10-CM | POA: Diagnosis not present

## 2019-10-31 DIAGNOSIS — R293 Abnormal posture: Secondary | ICD-10-CM

## 2019-10-31 DIAGNOSIS — M6281 Muscle weakness (generalized): Secondary | ICD-10-CM

## 2019-10-31 DIAGNOSIS — M25612 Stiffness of left shoulder, not elsewhere classified: Secondary | ICD-10-CM

## 2019-10-31 DIAGNOSIS — R482 Apraxia: Secondary | ICD-10-CM

## 2019-10-31 DIAGNOSIS — I69354 Hemiplegia and hemiparesis following cerebral infarction affecting left non-dominant side: Secondary | ICD-10-CM

## 2019-10-31 DIAGNOSIS — R29818 Other symptoms and signs involving the nervous system: Secondary | ICD-10-CM

## 2019-10-31 NOTE — Therapy (Signed)
[facility] [address] [place], [place], [address] Phone: [phone]   Fax:  [phone]  Occupational Therapy Treatment  Patient Details  Name: [name] MRN: [id number] Date of Birth: 10/26/1946 Referring Provider (OT): Dr. Letta Pate   Encounter Date: 10/31/2019  OT End of Session - 10/31/19 1032    Visit Number  6    Number of Visits  16    Date for OT Re-Evaluation  11/28/19    Authorization Type  medicare will need PN every 10th visit    Authorization Time Period  90 days    Authorization - Visit Number  6    Authorization - Number of Visits  10    OT Start Time  0846    OT Stop Time  0930    OT Time Calculation (min)  44 min    Activity Tolerance  Patient tolerated treatment well       Past Medical History:  Diagnosis Date  . Abnormality of gait 09/07/2016  . Allergy   . Diabetes mellitus without complication [facility])    Patient denies this - notes history of glucose intolerance  . Hemiparesis and alteration of sensations as late effects of stroke ([place]) 09/07/2016  . History of pneumonia 1997  . S/P MVR (mitral valve replacement)    Mechanical mitral valve replacement at age 69 (done in [place])  // echo 7/17: EF 55-60, normal wall motion, bileaflet mechanical mitral valve prosthesis functioning normally, mild LAE, mildly reduced RVSF, small pericardial effusion  . Stroke [facility]) 1997, 2013, 2015    Past Surgical History:  Procedure Laterality Date  . ABDOMINAL HYSTERECTOMY    . BRAIN SURGERY    . BUNIONECTOMY  1993  . CARDIAC VALVE REPLACEMENT  1997  . TOE SURGERY  1996    There were no vitals filed for this visit.  Subjective Assessment - 10/31/19 0849    Subjective   Wow, I get dizzy when I look up    Pertinent History  09/02/2019 R CVA ( suspect extension of old infarct affecting deep white matter and small focus adjacent to cortical brain.  PMH:  prior CVA's first in April of '97, August  of 97, fall of 2014, spring of 2015.    Patient Stated Goals  I hope my hand will get better "I want it down and I know I am suppose to swing it more naturally when I walk.  Sometimes I walk into the door frame on my left.    Currently in Pain?  No/denies   I get pain in my left leg but I don't have it right now                  OT Treatments/Exercises (OP) - 10/31/19 1022      ADLs   Driving  Pt and husband on evaluation requesting information/recommendation on return to driving.  Shared with pt and husband today that concerns would include decreased functional use of LUE as well as L inattention.  Strongly recommended that pt seek formal driving evaluation if she plans to pursue this.  Provided referral information in writing and reviewed copy of formal driving eval with pt and husband. Pt and husband are aware that they will need to obtain a physical order for driving evaluation (recommended they seek this from Dr. Willy Eddy) and fax to [facility].  Pt and husband are  also aware that insurance does not cover driving evaluation. Pt and husband verbalized understanding.  Neurological Re-education Exercises   Other Exercises 1  Neuro re ed to further assess visual vestibular function as pt reported feeling "dizzy" when looking up - pt actually experiences disequilibrum (not vertigo) and a sense that the "world is moving not me - but I know it's not."  Pt symptomatic with head nods and head turns (duration of 15 seconds with intensity of 6-7/10).  Pt also with impaired VOR, impaired saccades and presents with mild head tilt.  Shared findings with PT who stated they would incorporate into HEP as appropriate.               OT Education - 10/31/19 1030    Education Details  driviing evaluation info, vistual vestibular issues    Person(s) Educated  Patient;Spouse    Methods  Explanation;Demonstration    Comprehension  Verbalized understanding;Returned demonstration        OT Short Term Goals - 10/31/19 1031      OT SHORT TERM GOAL #1   Title  Pt and husband will be mod I with HEP to address L grip strength and L coordination - 10/31/19 (date adjused as pt has just now returned for first treatment session)    Status  Achieved      OT SHORT TERM GOAL #2   Title  Pt will be mod I with cutting food on plate AE prn    Status  Achieved   pt and husband to order rocker knife from [place] #3   Title  Pt will demonstrate understanding of use of button hook for smaller buttons    Status  On-going   awaiting already ordered button hook     OT SHORT TERM GOAL #4   Title  Pt will be mod I with bathing back AE prn    Status  Achieved      OT SHORT TERM GOAL #5   Title  Pt will be supervision for toilet tranfers    Status  Achieved   mod I level (per pt/family report)     OT SHORT TERM GOAL #6   Title  Pt will demonstrate improved grip strength by at least 4 pounds to assist with light housekeeping tasks (baseline = 22)    Status  Achieved   LUE = 29       OT Long Term Goals - 10/31/19 1031      OT LONG TERM GOAL #1   Title  Pt and husband will be mod I with HEP to address LUE strength/funtional use and improved activity tolerance. - 11/28/2019 (date adjusted as pt just now returning for first treatment session)    Status  On-going      OT LONG TERM GOAL #2   Title  Pt will be mod I with toilet transfers    Status  Achieved      OT LONG TERM GOAL #3   Title  Pt will be mod I with simple snack prep at ambulatory level    Status  Achieved      OT LONG TERM GOAL #4   Title  Pt will be mod I with laundry    Status  Achieved      OT LONG TERM GOAL #5   Title  Pt will require no more than supervsion for short shopping trips (i.e. [employer], [employer])    Status  Achieved      OT LONG TERM GOAL #6   Title  Pt will demonstrate improved grip strength by at least 6 pounds to assist with cold snack prep and light  housekeeping (baseline = 22)    Status  Achieved   LUE = 29 pounds     OT LONG TERM GOAL #7   Title  Pt will demonstrate overhead reach with LUE to at least 110* of shoulder flexion with LUE to obtain light weight object in overhead cabinet    Status  On-going      OT LONG TERM GOAL #8   Title  Pt and husband will verbalize understanding of driving recommendations    Status  Achieved            Plan - 10/31/19 1031    Clinical Impression Statement  Pt continues to progress toward OT goals. Pt is improving in overall independence and is beginning to do short trips into the community with supervision.    OT Occupational Profile and History  Detailed Assessment- Review of Records and additional review of physical, cognitive, psychosocial history related to current functional performance    Occupational performance deficits (Please refer to evaluation for details):  ADL's;IADL's;Leisure;Social Participation    Body Structure / Function / Physical Skills  ADL;Balance;Coordination;Decreased knowledge of use of DME;Dexterity;IADL;GMC;FMC;Endurance;Mobility;Proprioception;ROM;Sensation;Strength;Tone;UE functional use    Rehab Potential  Good    Clinical Decision Making  Several treatment options, min-mod task modification necessary    Comorbidities Affecting Occupational Performance:  Presence of comorbidities impacting occupational performance    Modification or Assistance to Complete Evaluation   Min-Moderate modification of tasks or assist with assess necessary to complete eval    OT Frequency  2x / week    OT Duration  8 weeks    OT Treatment/Interventions  Self-care/ADL training;Aquatic Therapy;Moist Heat;Therapeutic exercise;Neuromuscular education;DME and/or AE instruction;Passive range of motion;Manual Therapy;Therapist, nutritional;Therapeutic activities;Visual/perceptual remediation/compensation;Patient/family education;Balance training    Plan  continue functional use of LUE,  NMR for LUE for overhead reach/trunk, postural alignment and control,       Patient will benefit from skilled therapeutic intervention in order to improve the following deficits and impairments:   Body Structure / Function / Physical Skills: ADL, Balance, Coordination, Decreased knowledge of use of DME, Dexterity, IADL, GMC, FMC, Endurance, Mobility, Proprioception, ROM, Sensation, Strength, Tone, UE functional use       Visit Diagnosis: Hemiplegia and hemiparesis following cerebral infarction affecting left non-dominant side (HCC)  Unsteadiness on feet  Muscle weakness (generalized)  Apraxia  Abnormal posture  Other symptoms and signs involving the nervous system  Stiffness of left shoulder joint    Problem List Patient Active Problem List   Diagnosis Date Noted  . 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 Anticoagulated on warfarin   . Left hemiparesis ([place])   . History of CVA with residual deficit   . Thrombocytopenia ([place])   . Prediabetes   . CVA (cerebral vascular accident) ([place]) 09/03/2019  . Vertigo 09/24/2017  . Benign paroxysmal positional vertigo   . History of stroke   . Hyperlipemia 08/23/2017  . Encounter for therapeutic drug monitoring 08/08/2017  . Long term (current) use of anticoagulants [Z79.01] 07/12/2017  . Hemiparesis and alteration of sensations as late effects of stroke ([place]) 09/07/2016  . Abnormality of gait 09/07/2016  . HTN (hypertension) 04/13/2016  . H/O mitral valve replacement with mechanical valve 10/16/2015  . Diabetes mellitus without complication ([place])   . Hemiparesis (L sided - mild) due to old stroke [facility])     Quay Burow, OTR/L 10/31/2019, 10:33  AM  [facility] [address] [place], [place], [address] Phone: [phone]   Fax:  [phone]  Name: ESTEFANY JANTZ MRN: [id number] Date of Birth: 03-28-1946

## 2019-10-31 NOTE — Patient Instructions (Signed)
Access Code: [email or web address]  URL: [email or web address]  Date: 10/31/2019  Prepared by: Willow Ora   Exercises Backward Walking with Counter Support - 3-5 reps - 1 sets - 1x daily - 5x weekly Side Stepping with Counter Support - 3-5 reps - 1 sets - 1x daily - 5x weekly Standing Balance with Eyes Closed - 3 reps - 1 sets - 20 sec hold - 1x daily - 5x weekly Standing Tandem Balance with Counter Support - 3 reps - 1 sets - 30 hold - 2x daily - 6x weekly Standing March with Unilateral Counter Support - 10 reps - 3 sets - 2x daily - 6x weekly Forward Step Up - 10 reps - 1 sets - 1x daily - 5x weekly Sit to Stand without Arm Support - 10 reps - 1-2 sets - 2x daily - 6x weekly Standing Horizontal Saccades - 10 reps - 1 sets - 1x daily - 5x weekly Seated Vertical Saccades - 10 reps - 1 sets - 1x daily - 5x weekly Seated Gaze Stabilization with Head Rotation - 3 reps - 1 sets - 20 hold - 1x daily - 5x weekly Seated Gaze Stabilization with Head Nod - 3 reps - 1 sets - 20 hold - 1x daily - 5x weekly

## 2019-10-31 NOTE — Patient Instructions (Signed)
Local Driver Evaluation Programs: ° °Comprehensive Evaluation: includes clinical and in vehicle behind the wheel testing by OCCUPATIONAL THERAPIST. Programs have varying levels of adaptive controls available for trial.  ° °[facility] °[address] °[place], [place]  [address] °[phone] or [phone] °http://www.driver-rehab.com °Evaluator:  [name] Crompton, OT/CDRS/CDI/SCDCM/Low Vision Certification ° °[facility] °[address] °[place], [place] [address] °[phone] °https://www.[facility].org/home/services/rehabilitation.aspx °Evaluators:  Shannon Sheek, OT and Jill Tucker, OT ° °[facility] [place] [facility] (ONLY SERVES VETERANS!!) °[facility] °[address] °[place], [place]  [address] °[phone] [phone] °http://www.[place].va.gov/services/Physical_Medicine_Rehabilitation_Services.asp °Evaluators:  Eric Andrews, KT; Heidi Harris, KT;  Gary Whitaker, KT (KT=kiniesotherapist) ° ° °Clinical evaluations only:  Includes clinical testing, refers to other programs or local certified driving instructor for behind the wheel testing. ° °[facility] (outpatient Rehab) °[facility] °[address] °[place],  [address] °[phone] for scheduling °http://[email or web address].edu/Outpatient-Rehabilitation/Neurorehabilitation-Therapy.htm °Evaluators:  Kelly Lambeth, OT; Kate Phillips, OT ° °Other area clinical evaluators available upon request including [facility], [facility] and [facility]. ° ° °    Resource List °What is a Driver Evaluation: °Your Road Ahead - A Guide to Comprehensive Driving Evaluations °http://www.thehartford.com/resources/mature-market-excellence/publications-on-aging ° °[employer] for [employer] - Disability and Driving Fact Sheets °http://www.aded.net/?page=510 ° °Driving after a Brain  Injury: °[employer] °http://www.biausa.org/tbims-abstracts/if-there-is-an-effective-way-to-determine-if-someone-is-ready-to-drive-after-tbi?A=SearchResult&SearchID=9495675&ObjectID=2758842&ObjectType=35 ° °Driving with Adaptive Equipment: °[facility] Process °http://www.driver-rehab.com/adaptive-equipment ° °[employer] °http://www.nmeda.com/ ° ° ° ° ° ° °  °

## 2019-11-02 NOTE — Therapy (Signed)
[name] [address] [name], [place], [address] Phone: [phone]   Fax:  [phone]  Physical Therapy Treatment  Patient Details  Name: [name] MRN: [id number] Date of Birth: 1946-09-10 Referring Provider (PT): Charlett Blake, MD   Encounter Date: 10/31/2019     10/31/19 0942  PT Visits / Re-Eval  Visit Number 9  Number of Visits 24  Date for PT Re-Evaluation 12/12/19  Authorization  Authorization Type MCR  PT Time Calculation  PT Start Time 0933  PT Stop Time 1015  PT Time Calculation (min) 42 min  PT - End of Session  Equipment Utilized During Treatment Gait belt  Activity Tolerance Patient tolerated treatment well;No increased pain  Behavior During Therapy WFL for tasks assessed/performed    Past Medical History:  Diagnosis Date  . Abnormality of gait 09/07/2016  . Allergy   . Diabetes mellitus without complication [facility])    Patient denies this - notes history of glucose intolerance  . Hemiparesis and alteration of sensations as late effects of stroke ([employer]) 09/07/2016  . History of pneumonia 1997  . S/P MVR (mitral valve replacement)    Mechanical mitral valve replacement at age 58 (done in [place])  // echo 7/17: EF 55-60, normal wall motion, bileaflet mechanical mitral valve prosthesis functioning normally, mild LAE, mildly reduced RVSF, small pericardial effusion  . Stroke [facility]) 1997, 2013, 2015    Past Surgical History:  Procedure Laterality Date  . ABDOMINAL HYSTERECTOMY    . BRAIN SURGERY    . BUNIONECTOMY  1993  . CARDIAC VALVE REPLACEMENT  1997  . TOE SURGERY  1996    There were no vitals filed for this visit.     10/31/19 0937  Symptoms/Limitations  Subjective Overall her hip feels better, not constant like it used to be. Her thigh feels "much, much better". Still getting a "catch, like a knot" in her buttock area. No falls. OT reported pt with onset of dizziness  with an activity in her session. On futher testing OT found pt to have decreased VOR and head motion sensitivity.  How long can you sit comfortably? 1-2 min  Diagnostic tests MRI showed old hemorrhagic infarct in the right parietal cortical and subcortical brain which had progressed atrophy, encephalomalacia.  Suspicion of minor acute extension along the anterior margin of the old infarction affecting the deep white matter and small focus adjacent cortical brain.  Pain Assessment  Currently in Pain? Yes  Pain Score 1 (5/10= worst in last 24 hours)  Pain Location Hip  Pain Orientation Left  Pain Descriptors / Indicators Aching;Other (Comment) ("catch, like a knot")  Pain Type Chronic pain  Pain Radiating Towards into thigh  Pain Onset More than a month ago  Pain Frequency Intermittent  Aggravating Factors  with sitting down mostly, sometimes in standing  Pain Relieving Factors bengay, tylenol, ice massage      10/31/19 0952  Transfers  Transfers Sit to Stand;Stand to Sit  Sit to Stand 5: Supervision;With upper extremity assist;From chair/3-in-1;From bed;4: Min guard;4: Min assist;Without upper extremity assist  Stand to Sit 5: Supervision;With upper extremity assist;To bed;To chair/3-in-1  Neuro Re-ed   Neuro Re-ed Details  added sacccades and x1 seated ex's to HEP. mild symptoms with each that resolved with rest breaks. Refer to [place] program for full details.   Iontophoresis  Type of Iontophoresis Dexamethasone  Location left piriformis area  Dose 2 cc  Time 31-14 minutes  Access Code: [email or web address]  URL: [email or web address]  Date: 10/31/2019  Prepared by: Willow Ora   Exercises Backward Walking with Counter Support - 3-5 reps - 1 sets - 1x daily - 5x weekly Side Stepping with Counter Support - 3-5 reps - 1 sets - 1x daily - 5x weekly Standing Balance with Eyes Closed - 3 reps - 1 sets - 20 sec hold - 1x daily - 5x weekly Standing Tandem Balance with  Counter Support - 3 reps - 1 sets - 30 hold - 2x daily - 6x weekly Standing March with Unilateral Counter Support - 10 reps - 3 sets - 2x daily - 6x weekly Forward Step Up - 10 reps - 1 sets - 1x daily - 5x weekly  Sit to Stand without Arm Support - 10 reps - 1-2 sets - 2x daily - 6x weekly  Added the following to HEP today. Cues needed on technique. Mild symptoms reported that resolved with rest breaks.  Standing Horizontal Saccades - 10 reps - 1 sets - 1x daily - 5x weekly Seated Vertical Saccades - 10 reps - 1 sets - 1x daily - 5x weekly Seated Gaze Stabilization with Head Rotation - 3 reps - 1 sets - 20 hold - 1x daily - 5x weekly Seated Gaze Stabilization with Head Nod - 3 reps - 1 sets - 20 hold - 1x daily - 5x weekly     10/31/19 1011  PT Education  Education Details added saccades and x1 seated ex's to HEP.  Person(s) Educated Patient;Spouse  Methods Explanation;Demonstration;Verbal cues;Handout  Comprehension Verbalized understanding;Returned demonstration;Need further instruction;Verbal cues required         PT Short Term Goals - 10/23/19 1827      PT SHORT TERM GOAL #1   Title  Patient to be independent with initial HEP. (target for STG 4 weeks 10/19/19)    Baseline  10/17/19: met per pt and spouse report    Status  Achieved      PT SHORT TERM GOAL #2   Title  Pt will improve TUG to less than 20 sec    Baseline  10/23/19: 22.22 sec's with cane, improved from 25 sec's just not to goal.    Status  Partially Met      PT SHORT TERM GOAL #3   Title  Pt will improve BERG to more than 40    Baseline  10/17/19: 44/56 scored today    Status  Achieved        PT Long Term Goals - 09/19/19 1644      PT LONG TERM GOAL #1   Title  Patient to be independent with advanced HEP. (Target for LTG 12 weeks 12/12/19)    Time  12    Period  Weeks    Status  New      PT LONG TERM GOAL #2   Title  Pt will improve TUG to less than 17 seconds    Baseline  25    Time  12    Period   Weeks    Status  New      PT LONG TERM GOAL #3   Title  Patient to score <20 sec on 5xSTS without UE support to decrease risk of falls.    Baseline  38    Time  12    Period  Weeks    Status  New      PT LONG TERM GOAL #4   Title  Patient to improve gait speed to  2 ft/sec with LRAD    Baseline  1.66    Time  12    Period  Weeks    Status  New      PT LONG TERM GOAL #5   Title  Pt to ambulate limited community distances mod I at least 450 ft with LRAD and negotiate up/down one flight of stairs.    Time  12    Period  Weeks    Status  New      Additional Long Term Goals   Additional Long Term Goals  Yes      PT LONG TERM GOAL #6   Title  Pt will improve BERG to at least 45 to show improved balance    Baseline  33    Time  12    Period  Weeks    Status  New         10/31/19 0943  Plan  Clinical Impression Statement Today's skilled session initally focused on adding ex's to pt's HEP to address dizziness with no issues reported in session. Remainder of session continued to address hip pain with no pain at end of session. The pt is progressing and should benefit from continued PT to progress toward unmet goals.  Personal Factors and Comorbidities Comorbidity 3+  Examination-Activity Limitations Locomotion Level;Transfers;Reach Overhead;Carry;Squat;Stairs;Lift;Stand  Examination-Participation Restrictions Meal Prep;Cleaning;Community Activity;Driving;Laundry;Shop  Pt will benefit from skilled therapeutic intervention in order to improve on the following deficits Abnormal gait;Decreased activity tolerance;Decreased balance;Decreased coordination;Decreased endurance;Decreased mobility;Decreased range of motion;Decreased strength;Difficulty walking;Impaired UE functional use;Postural dysfunction;Improper body mechanics  Stability/Clinical Decision Making Evolving/Moderate complexity  Rehab Potential Good  PT Frequency 2x / week  PT Duration 12 weeks  PT Treatment/Interventions  Passive range of motion;Iontophoresis 54m/ml Dexamethasone;Ultrasound;Electrical Stimulation;Cryotherapy;Manual techniques  PT Next Visit Plan 10th visit progress note next visit; work on sit to stand technique keeping left LE in neutral as it tends to IR (could be cause of some of her hip pain); left LE strengthening targeting hip muscles when pain allows.  PT Home Exercise Plan Access Code: [email or web address]  Consulted and Agree with Plan of Care Patient          Patient will benefit from skilled therapeutic intervention in order to improve the following deficits and impairments:  Abnormal gait, Decreased activity tolerance, Decreased balance, Decreased coordination, Decreased endurance, Decreased mobility, Decreased range of motion, Decreased strength, Difficulty walking, Impaired UE functional use, Postural dysfunction, Improper body mechanics  Visit Diagnosis: Hemiplegia and hemiparesis following cerebral infarction affecting left non-dominant side (HCC)  Unsteadiness on feet  Muscle weakness (generalized)     Problem List Patient Active Problem List   Diagnosis Date Noted  . Subcortical infarction ([place] 09/05/2019  . Anticoagulated on warfarin   . Left hemiparesis ([place]   . History of CVA with residual deficit   . Thrombocytopenia ([place]   . Prediabetes   . CVA (cerebral vascular accident) ([place] 09/03/2019  . Vertigo 09/24/2017  . Benign paroxysmal positional vertigo   . History of stroke   . Hyperlipemia 08/23/2017  . Encounter for therapeutic drug monitoring 08/08/2017  . Long term (current) use of anticoagulants [Z79.01] 07/12/2017  . Hemiparesis and alteration of sensations as late effects of stroke ([facility] 09/07/2016  . Abnormality of gait 09/07/2016  . HTN (hypertension) 04/13/2016  . H/O mitral valve replacement with mechanical valve 10/16/2015  . Diabetes mellitus without complication ([place]   . Hemiparesis (L sided - mild) due to old stroke ([place]     KWillow Ora  PTA,  [facility] [address], [place], [place] [address] [phone] 11/02/19, 2:47 PM   Name: [name] MRN: [id number] Date of Birth: Aug 31, 1946

## 2019-11-05 ENCOUNTER — Ambulatory Visit (INDEPENDENT_AMBULATORY_CARE_PROVIDER_SITE_OTHER): Payer: Medicare Other | Admitting: Internal Medicine

## 2019-11-05 DIAGNOSIS — Z5181 Encounter for therapeutic drug level monitoring: Secondary | ICD-10-CM | POA: Diagnosis not present

## 2019-11-05 DIAGNOSIS — I69359 Hemiplegia and hemiparesis following cerebral infarction affecting unspecified side: Secondary | ICD-10-CM

## 2019-11-05 DIAGNOSIS — Z952 Presence of prosthetic heart valve: Secondary | ICD-10-CM

## 2019-11-05 DIAGNOSIS — Z7901 Long term (current) use of anticoagulants: Secondary | ICD-10-CM | POA: Diagnosis not present

## 2019-11-05 LAB — POCT INR: INR: 3.1 — AB (ref 2.0–3.0)

## 2019-11-12 ENCOUNTER — Ambulatory Visit (INDEPENDENT_AMBULATORY_CARE_PROVIDER_SITE_OTHER): Payer: Medicare Other

## 2019-11-12 DIAGNOSIS — Z952 Presence of prosthetic heart valve: Secondary | ICD-10-CM

## 2019-11-12 DIAGNOSIS — Z5181 Encounter for therapeutic drug level monitoring: Secondary | ICD-10-CM

## 2019-11-12 DIAGNOSIS — I69359 Hemiplegia and hemiparesis following cerebral infarction affecting unspecified side: Secondary | ICD-10-CM

## 2019-11-12 DIAGNOSIS — Z7901 Long term (current) use of anticoagulants: Secondary | ICD-10-CM

## 2019-11-12 LAB — POCT INR: INR: 3.7 — AB (ref 2.0–3.0)

## 2019-11-12 NOTE — Patient Instructions (Signed)
Description   Spoke with pt and instructed pt to take 2.5mg  today, then resume same dosage 5mg  daily except 7.5mg  on Mondays and Fridays. Pt will continue greens with 2 cups of lettuce on Fridays. Does weekly checks due to insurance.

## 2019-11-13 ENCOUNTER — Other Ambulatory Visit: Payer: Self-pay

## 2019-11-13 ENCOUNTER — Ambulatory Visit: Payer: Medicare Other | Admitting: Occupational Therapy

## 2019-11-13 ENCOUNTER — Ambulatory Visit: Payer: Medicare Other | Attending: Physical Medicine & Rehabilitation

## 2019-11-13 DIAGNOSIS — R482 Apraxia: Secondary | ICD-10-CM | POA: Diagnosis not present

## 2019-11-13 DIAGNOSIS — R2681 Unsteadiness on feet: Secondary | ICD-10-CM

## 2019-11-13 DIAGNOSIS — M6281 Muscle weakness (generalized): Secondary | ICD-10-CM

## 2019-11-13 DIAGNOSIS — M25552 Pain in left hip: Secondary | ICD-10-CM | POA: Diagnosis not present

## 2019-11-13 DIAGNOSIS — R293 Abnormal posture: Secondary | ICD-10-CM | POA: Diagnosis not present

## 2019-11-13 DIAGNOSIS — I69354 Hemiplegia and hemiparesis following cerebral infarction affecting left non-dominant side: Secondary | ICD-10-CM | POA: Diagnosis not present

## 2019-11-13 DIAGNOSIS — R270 Ataxia, unspecified: Secondary | ICD-10-CM

## 2019-11-13 DIAGNOSIS — R2689 Other abnormalities of gait and mobility: Secondary | ICD-10-CM | POA: Diagnosis not present

## 2019-11-13 NOTE — Therapy (Signed)
[facility] [address] [place], [place], [address] Phone: [phone]   Fax:  [phone]  Occupational Therapy Treatment  Patient Details  Name: [name] MRN: [id number] Date of Birth: [birth year] Referring Provider (OT): Dr. Letta Pate   Encounter Date: 11/13/2019  OT End of Session - 11/13/19 1456    Visit Number  7    Number of Visits  16    Date for OT Re-Evaluation  11/28/19    Authorization Type  medicare will need PN every 10th visit    Authorization Time Period  90 days    Authorization - Visit Number  7    Authorization - Number of Visits  10    OT Start Time  0930    OT Stop Time  1015    OT Time Calculation (min)  45 min    Activity Tolerance  Patient tolerated treatment well    Behavior During Therapy  [facility] for tasks assessed/performed       Past Medical History:  Diagnosis Date  . Abnormality of gait 09/07/2016  . Allergy   . Diabetes mellitus without complication [facility])    Patient denies this - notes history of glucose intolerance  . Hemiparesis and alteration of sensations as late effects of stroke ([place]) 09/07/2016  . History of pneumonia 1997  . S/P MVR (mitral valve replacement)    Mechanical mitral valve replacement at age 74 (done in [place])  // echo 7/17: EF 55-60, normal wall motion, bileaflet mechanical mitral valve prosthesis functioning normally, mild LAE, mildly reduced RVSF, small pericardial effusion  . Stroke [facility]) 1997, 2013, 2015    Past Surgical History:  Procedure Laterality Date  . ABDOMINAL HYSTERECTOMY    . BRAIN SURGERY    . BUNIONECTOMY  1993  . CARDIAC VALVE REPLACEMENT  1997  . TOE SURGERY  1996    There were no vitals filed for this visit.  Subjective Assessment - 11/13/19 0939    Patient is accompanied by:  Family member    Pertinent History  09/02/2019 R CVA ( suspect extension of old infarct affecting deep white matter and small focus adjacent to  cortical brain.  PMH:  prior CVA's first in April of '97, August of 97, fall of 2014, spring of 2015.    Patient Stated Goals  I hope my hand will get better "I want it down and I know I am suppose to swing it more naturally when I walk.  Sometimes I walk into the door frame on my left.    Currently in Pain?  No/denies       Pt reports difficulty opening jars/containers. Practiced w/ use of shelf liner, however pt still unable to open. Suggested not screwing containers on as tight and therapist had pt attempt slightly looser and was able to do. Also recommended jar opener if shelf liner insufficient and shown picture.   Supine: BUE sh flexion holding 2 lb weight both hands facing for chest press to overhead sh flexion w/ cues for positioning and attention to Lt side d/t decreased proprioception and apraxia. Progressed to LUE only using visual target for positioning (holding cup) Seated: worked on functional low to mid level reaching in flexion and abduction w/ focus on combined movements - pt apraxic and demo dystonias (going into wrist flexion w/ elbow ext) but w/ repetition could work on elbow, forearm, and wrist movement w/o shoulder compensations w/ min facilitation. Pt did very well w/ repetition, min facilitation, and  use of cup in hand for positioning                      OT Short Term Goals - 10/31/19 1031      OT SHORT TERM GOAL #1   Title  Pt and husband will be mod I with HEP to address L grip strength and L coordination - 10/31/19 (date adjused as pt has just now returned for first treatment session)    Status  Achieved      OT SHORT TERM GOAL #2   Title  Pt will be mod I with cutting food on plate AE prn    Status  Achieved   pt and husband to order rocker knife from [place] #3   Title  Pt will demonstrate understanding of use of button hook for smaller buttons    Status  On-going   awaiting already ordered button hook     OT SHORT  TERM GOAL #4   Title  Pt will be mod I with bathing back AE prn    Status  Achieved      OT SHORT TERM GOAL #5   Title  Pt will be supervision for toilet tranfers    Status  Achieved   mod I level (per pt/family report)     OT SHORT TERM GOAL #6   Title  Pt will demonstrate improved grip strength by at least 4 pounds to assist with light housekeeping tasks (baseline = 22)    Status  Achieved   LUE = 29       OT Long Term Goals - 10/31/19 1031      OT LONG TERM GOAL #1   Title  Pt and husband will be mod I with HEP to address LUE strength/funtional use and improved activity tolerance. - 11/28/2019 (date adjusted as pt just now returning for first treatment session)    Status  On-going      OT LONG TERM GOAL #2   Title  Pt will be mod I with toilet transfers    Status  Achieved      OT LONG TERM GOAL #3   Title  Pt will be mod I with simple snack prep at ambulatory level    Status  Achieved      OT LONG TERM GOAL #4   Title  Pt will be mod I with laundry    Status  Achieved      OT LONG TERM GOAL #5   Title  Pt will require no more than supervsion for short shopping trips (i.e. [employer], [employer])    Status  Achieved      OT LONG TERM GOAL #6   Title  Pt will demonstrate improved grip strength by at least 6 pounds to assist with cold snack prep and light housekeeping (baseline = 22)    Status  Achieved   LUE = 29 pounds     OT LONG TERM GOAL #7   Title  Pt will demonstrate overhead reach with LUE to at least 110* of shoulder flexion with LUE to obtain light weight object in overhead cabinet    Status  On-going      OT LONG TERM GOAL #8   Title  Pt and husband will verbalize understanding of driving recommendations    Status  Achieved            Plan - 11/13/19 1456  Clinical Impression Statement  Pt continues to progress toward OT goals. Pt is improving in overall independence and is beginning to do short trips into the community with supervision.     Occupational performance deficits (Please refer to evaluation for details):  ADL's;IADL's;Leisure;Social Participation    Body Structure / Function / Physical Skills  ADL;Balance;Coordination;Decreased knowledge of use of DME;Dexterity;IADL;GMC;FMC;Endurance;Mobility;Proprioception;ROM;Sensation;Strength;Tone;UE functional use    OT Frequency  2x / week    OT Duration  8 weeks    OT Treatment/Interventions  Self-care/ADL training;Aquatic Therapy;Moist Heat;Therapeutic exercise;Neuromuscular education;DME and/or AE instruction;Passive range of motion;Manual Therapy;Therapist, nutritional;Therapeutic activities;Visual/perceptual remediation/compensation;Patient/family education;Balance training    Plan  continue functional use of LUE, NMR for LUE for overhead reach/trunk, postural alignment and control,    Consulted and Agree with Plan of Care  Patient;Family member/caregiver    Family Member Consulted  husband       Patient will benefit from skilled therapeutic intervention in order to improve the following deficits and impairments:   Body Structure / Function / Physical Skills: ADL, Balance, Coordination, Decreased knowledge of use of DME, Dexterity, IADL, GMC, FMC, Endurance, Mobility, Proprioception, ROM, Sensation, Strength, Tone, UE functional use       Visit Diagnosis: Hemiplegia and hemiparesis following cerebral infarction affecting left non-dominant side (HCC)  Apraxia  Ataxia    Problem List Patient Active Problem List   Diagnosis Date Noted  . Subcortical infarction ([place]) 09/05/2019  . Anticoagulated on warfarin   . Left hemiparesis ([place])   . History of CVA with residual deficit   . Thrombocytopenia ([place])   . Prediabetes   . CVA (cerebral vascular accident) ([place]) 09/03/2019  . Vertigo 09/24/2017  . Benign paroxysmal positional vertigo   . History of stroke   . Hyperlipemia 08/23/2017  . Encounter for therapeutic drug monitoring 08/08/2017  . Long term  (current) use of anticoagulants [Z79.01] 07/12/2017  . Hemiparesis and alteration of sensations as late effects of stroke ([place]) 09/07/2016  . Abnormality of gait 09/07/2016  . HTN (hypertension) 04/13/2016  . H/O mitral valve replacement with mechanical valve 10/16/2015  . Diabetes mellitus without complication ([place])   . Hemiparesis (L sided - mild) due to old stroke [facility])     Carey Bullocks, OTR/L 11/13/2019, 2:57 PM  [facility] [address] [place], [place], [address] Phone: [phone]   Fax:  [phone]  Name: MARCHITA WHETSELL MRN: [id number] Date of Birth: 01-Jul-1946

## 2019-11-13 NOTE — Therapy (Signed)
[facility] [address] [place], [place], [address] Phone: [phone]   Fax:  [phone]  Physical Therapy Treatment/ 10th visit progress  Patient Details  Name: [name] MRN: [id number] Date of Birth: 74-Aug-1947 Referring Provider (PT): Charlett Blake, MD    Progress Note  Reporting period 09/19/2019 to 11/13/2019  See Note below for Objective Data and Assessment of Progress/Goals   Encounter Date: 11/13/2019  PT End of Session - 11/13/19 1014    Visit Number  10    Number of Visits  24    Date for PT Re-Evaluation  12/12/19    Authorization Type  MCR    PT Start Time  1015    PT Stop Time  1100    PT Time Calculation (min)  45 min    Equipment Utilized During Treatment  Gait belt    Activity Tolerance  Patient tolerated treatment well;No increased pain    Behavior During Therapy  WFL for tasks assessed/performed       Past Medical History:  Diagnosis Date  . Abnormality of gait 09/07/2016  . Allergy   . Diabetes mellitus without complication [employer])    Patient denies this - notes history of glucose intolerance  . Hemiparesis and alteration of sensations as late effects of stroke ([place]) 09/07/2016  . History of pneumonia 1997  . S/P MVR (mitral valve replacement)    Mechanical mitral valve replacement at age 74 (done in [place])  // echo 7/17: EF 55-60, normal wall motion, bileaflet mechanical mitral valve prosthesis functioning normally, mild LAE, mildly reduced RVSF, small pericardial effusion  . Stroke [facility]) 1997, 2013, 2015    Past Surgical History:  Procedure Laterality Date  . ABDOMINAL HYSTERECTOMY    . BRAIN SURGERY    . BUNIONECTOMY  1993  . CARDIAC VALVE REPLACEMENT  1997  . TOE SURGERY  1996    There were no vitals filed for this visit.  Subjective Assessment - 11/13/19 1016    Subjective  Pt reports that hip pain is on an off. Can come on when sitting or when standing. Is a  poking feeling when comes on.    How long can you sit comfortably?  1-2 min    Diagnostic tests  MRI showed old hemorrhagic infarct in the right parietal cortical and subcortical brain which had progressed atrophy, encephalomalacia.  Suspicion of minor acute extension along the anterior margin of the old infarction affecting the deep white matter and small focus adjacent cortical brain.    Currently in Pain?  No/denies   pt reports that comes and goes. Seems worse in morning or evening   Pain Score  0-No pain    Pain Location  Hip    Pain Orientation  Left    Pain Descriptors / Indicators  --   poking   Pain Type  Chronic pain    Pain Onset  More than a month ago    Pain Frequency  Intermittent                       [facility] Adult PT Treatment/Exercise - 11/13/19 1027      Ambulation/Gait   Ambulation/Gait  Yes    Ambulation/Gait Assistance  5: Supervision    Ambulation Distance (Feet)  150 Feet    Assistive device  None   AFO with heel lift   Gait Pattern  Step-through pattern;Decreased step length - right;Decreased step length - left  Gait velocity  21.17=1.90f/sec    Gait Comments  Pt denied any increased pain with gait.      Standardized Balance Assessment   Standardized Balance Assessment  Berg Balance Test      Berg Balance Test   Sit to Stand  Able to stand without using hands and stabilize independently    Standing Unsupported  Able to stand safely 2 minutes    Sitting with Back Unsupported but Feet Supported on Floor or Stool  Able to sit safely and securely 2 minutes    Stand to Sit  Sits safely with minimal use of hands    Transfers  Able to transfer safely, minor use of hands    Standing Unsupported with Eyes Closed  Able to stand 10 seconds safely    Standing Ubsupported with Feet Together  Able to place feet together independently and stand for 1 minute with supervision    From Standing, Reach Forward with Outstretched Arm  Can reach confidently >25  cm (10")    From Standing Position, Pick up Object from Floor  Unable to pick up and needs supervision    From Standing Position, Turn to Look Behind Over each Shoulder  Looks behind from both sides and weight shifts well    Turn 360 Degrees  Able to turn 360 degrees safely but slowly    Standing Unsupported, Alternately Place Feet on Step/Stool  Able to complete >2 steps/needs minimal assist    Standing Unsupported, One Foot in Front  Able to take small step independently and hold 30 seconds    Standing on One Leg  Tries to lift leg/unable to hold 3 seconds but remains standing independently    Total Score  42      Exercises   Exercises  Other Exercises    Other Exercises   Pt performed hooklying left hip isometrics x 5 each in hip abd and adduction with 5 sec holds. Pt reported a little pain initially in groin with hip abd isometric but improved as went on. Pt was instructed in way to modify the IT band stretch that she was doing at home as pt reporting pain with lowering leg down crossing all the way over. Had her perform in hooklying crossing left leg over right and internally rotating left hip over to comfortable stretching position. Explained to patient that stretching should not be painful. [name] without increased pain.      Manual Therapy   Manual therapy comments  PT assessed left hip and lumbar spine. Noted scoliosis present with lower lumbar curve to right. Pt had trigger points to right quadratus and piriformis area with tenderness over. Tender to palpation over at lumbar spine along paraspinals and left hip. Painful with passive left hip flexion. Decrease pain in supine position with legs extended. No change with left hip distraction. No pain with A/P mobs to left hip.             PT Education - 11/13/19 1919    Education Details  Education on chronic left hip pain most likely multifactorial with contribution from low back as well as weakness and scoliosis. Pt  continuing to ice left hip. PT did advise against using strap with IT band stretch and modified to hooklying position as patient stated she was getting pain with that stretch.    Person(s) Educated  Patient    Methods  Explanation    Comprehension  Verbalized understanding       PT  Short Term Goals - 11/13/19 1921      PT SHORT TERM GOAL #1   Title  Patient to be independent with initial HEP.    Baseline  10/17/19: met per pt and spouse report    Status  Achieved      PT SHORT TERM GOAL #2   Title  Pt will improve TUG to less than 20 sec    Baseline  10/23/19: 22.22 sec's with cane, improved from 25 sec's just not to goal.    Time  4    Period  Weeks    Status  Partially Met    Target Date  11/19/19      PT SHORT TERM GOAL #3   Title  Pt will improve BERG to more than 40    Baseline  10/17/19: 44/56 scored today    Status  Achieved      PT SHORT TERM GOAL #4   Title  Pt will be instructed in ways to better manage left hip pain for improved ability to participate with activities.    Time  4    Period  Weeks    Status  New    Target Date  11/19/19        PT Long Term Goals - 11/13/19 1043      PT LONG TERM GOAL #1   Title  Patient to be independent with advanced HEP. (Target for LTG 12 weeks 12/12/19)    Time  12    Period  Weeks    Status  New      PT LONG TERM GOAL #2   Title  Pt will improve TUG to less than 17 seconds    Baseline  25    Time  12    Period  Weeks    Status  New      PT LONG TERM GOAL #3   Title  Patient to score <20 sec on 5xSTS without UE support to decrease risk of falls.    Baseline  38    Time  12    Period  Weeks    Status  New      PT LONG TERM GOAL #4   Title  Patient to improve gait speed to 2 ft/sec with LRAD    Baseline  1.70f/sec on 11/13/2019 with SPC    Time  12    Period  Weeks    Status  New      PT LONG TERM GOAL #5   Title  Pt to ambulate limited community distances mod I at least 450 ft with LRAD and negotiate up/down  one flight of stairs.    Time  12    Period  Weeks    Status  New      PT LONG TERM GOAL #6   Title  Pt will improve BERG to at least 45 to show improved balance    Baseline  42/56 on 11/13/2019    Time  12    Period  Weeks    Status  On-going            Plan - 11/13/19 1933    Clinical Impression Statement  PT reassessed gait speed and Berg at visit today. Pt was not able to improve on either since last assessement. Pt was wearing heel lift under AFO which did appear to keep pelvis more level but patient did report some pain in left hip area with standing activities that was intermittent. PT reassessed left  hip pain and feels this chronic pain is more multifactorial in nature and back may also be contributing. Pt with scoliotic curve in lumbar spine and also has multiple trigger points with palpation with paraspinals/quadratus and piriiformis area more so on right. Tender to palpation over left hip. Pt feels some relief with ionto treatments but reports does not last. Pt reported some transient dizziness with standing feeling more unsteady when asked to explain. Pt will benefit from continued PT to continue to work more on balance, gait, strengthening and management of left hip/back pain.    Personal Factors and Comorbidities  Comorbidity 3+    Examination-Activity Limitations  Locomotion Level;Transfers;Reach Overhead;Carry;Squat;Stairs;Lift;Stand    Examination-Participation Restrictions  Meal Prep;Cleaning;Community Activity;Driving;Laundry;Shop    Stability/Clinical Decision Making  Evolving/Moderate complexity    Rehab Potential  Good    PT Frequency  2x / week    PT Duration  12 weeks    PT Treatment/Interventions  Passive range of motion;Iontophoresis '4mg'$ /ml Dexamethasone;Ultrasound;Electrical Stimulation;Cryotherapy;Manual techniques;Therapeutic exercise;Therapeutic activities;Stair training;Gait training;Balance training;Neuromuscular re-education    PT Next Visit Plan  work on  sit to stand technique keeping left LE in neutral as it tends to IR (could be cause of some of her hip pain); left LE strengthening targeting hip muscles when pain allows, balance and gait.    PT Home Exercise Plan  Access Code: [email or web address]    Consulted and Agree with Plan of Care  Patient       Patient will benefit from skilled therapeutic intervention in order to improve the following deficits and impairments:  Abnormal gait, Decreased activity tolerance, Decreased balance, Decreased coordination, Decreased endurance, Decreased mobility, Decreased range of motion, Decreased strength, Difficulty walking, Impaired UE functional use, Postural dysfunction, Improper body mechanics  Visit Diagnosis: Muscle weakness (generalized)  Pain in left hip  Unsteadiness on feet     Problem List Patient Active Problem List   Diagnosis Date Noted  . 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 Anticoagulated on warfarin   . Left hemiparesis ([place])   . History of CVA with residual deficit   . Thrombocytopenia ([place])   . Prediabetes   . CVA (cerebral vascular accident) ([place]) 09/03/2019  . Vertigo 09/24/2017  . Benign paroxysmal positional vertigo   . History of stroke   . Hyperlipemia 08/23/2017  . Encounter for therapeutic drug monitoring 08/08/2017  . Long term (current) use of anticoagulants [Z79.01] 07/12/2017  . Hemiparesis and alteration of sensations as late effects of stroke ([place]) 09/07/2016  . Abnormality of gait 09/07/2016  . HTN (hypertension) 04/13/2016  . H/O mitral valve replacement with mechanical valve 10/16/2015  . Diabetes mellitus without complication ([place])   . Hemiparesis (L sided - mild) due to old stroke ([place])     Electa Sniff, PT, DPT, NCS 11/13/2019, 8:19 PM  [facility] [address] [place], [place], [address] Phone: [phone]   Fax:  [phone]  Name: KANIYA TRUEHEART MRN: [id number] Date of Birth:  1946/07/07

## 2019-11-14 ENCOUNTER — Telehealth: Payer: Self-pay | Admitting: *Deleted

## 2019-11-14 NOTE — Telephone Encounter (Signed)
[name] is asking for something on letterhead to be sent to [employer] [facility] for her massage therapy. She says they have some records. Do you know what she is asking for?

## 2019-11-15 ENCOUNTER — Ambulatory Visit: Payer: Medicare Other | Admitting: Physical Therapy

## 2019-11-15 ENCOUNTER — Ambulatory Visit: Payer: Medicare Other | Admitting: Occupational Therapy

## 2019-11-15 ENCOUNTER — Encounter: Payer: Self-pay | Admitting: Physical Therapy

## 2019-11-15 ENCOUNTER — Other Ambulatory Visit: Payer: Self-pay

## 2019-11-15 DIAGNOSIS — R482 Apraxia: Secondary | ICD-10-CM | POA: Diagnosis not present

## 2019-11-15 DIAGNOSIS — I69354 Hemiplegia and hemiparesis following cerebral infarction affecting left non-dominant side: Secondary | ICD-10-CM

## 2019-11-15 DIAGNOSIS — R2681 Unsteadiness on feet: Secondary | ICD-10-CM

## 2019-11-15 DIAGNOSIS — R2689 Other abnormalities of gait and mobility: Secondary | ICD-10-CM | POA: Diagnosis not present

## 2019-11-15 DIAGNOSIS — M25552 Pain in left hip: Secondary | ICD-10-CM | POA: Diagnosis not present

## 2019-11-15 DIAGNOSIS — M6281 Muscle weakness (generalized): Secondary | ICD-10-CM | POA: Diagnosis not present

## 2019-11-15 NOTE — Telephone Encounter (Signed)
I am not sure if pt is trying to get medicare to cover massage (they don't) or if "[facility]" is requiring a letter bawsed on her medical comorbidities.  I am ok with pt receiving massage

## 2019-11-16 NOTE — Telephone Encounter (Signed)
I left a VM asking her to call back with clarification on why she is asking for the letter.

## 2019-11-16 NOTE — Therapy (Signed)
[facility] [address] [place], [place], [address] Phone: [phone]   Fax:  [phone]  Physical Therapy Treatment  Patient Details  Name: [name] MRN: [id number] Date of Birth: [birth year] Referring Provider (PT): [name], MD   Encounter Date: 11/15/2019  PT End of Session - 11/15/19 1105    Visit Number  11    Number of Visits  24    Date for PT Re-Evaluation  12/12/19    Authorization Type  MCR    PT Start Time  1101    PT Stop Time  1148    PT Time Calculation (min)  47 min    Equipment Utilized During Treatment  Gait belt    Activity Tolerance  Patient tolerated treatment well;No increased pain    Behavior During Therapy  WFL for tasks assessed/performed       Past Medical History:  Diagnosis Date  . Abnormality of gait 09/07/2016  . Allergy   . Diabetes mellitus without complication [facility])    Patient denies this - notes history of glucose intolerance  . Hemiparesis and alteration of sensations as late effects of stroke ([place]) 09/07/2016  . History of pneumonia 1997  . S/P MVR (mitral valve replacement)    Mechanical mitral valve replacement at age 19 (done in [place])  // echo 7/17: EF 55-60, normal wall motion, bileaflet mechanical mitral valve prosthesis functioning normally, mild LAE, mildly reduced RVSF, small pericardial effusion  . Stroke [facility]) 1997, 2013, 2015    Past Surgical History:  Procedure Laterality Date  . ABDOMINAL HYSTERECTOMY    . BRAIN SURGERY    . BUNIONECTOMY  1993  . CARDIAC VALVE REPLACEMENT  1997  . TOE SURGERY  1996    There were no vitals filed for this visit.  Subjective Assessment - 11/15/19 1103    Subjective  No new complaints. No falls. HEP modification is going well as long as she stays in pain free ranges.    Patient is accompained by:  Family member   spouse   How long can you sit comfortably?  1-2 min    Diagnostic tests  MRI showed old  hemorrhagic infarct in the right parietal cortical and subcortical brain which had progressed atrophy, encephalomalacia.  Suspicion of minor acute extension along the anterior margin of the old infarction affecting the deep white matter and small focus adjacent cortical brain.    Currently in Pain?  No/denies   does report soreness in her thigh only   Pain Score  0-No pain            [facility] Adult PT Treatment/Exercise - 11/15/19 1106      Transfers   Transfers  Sit to Stand;Stand to Sit    Sit to Stand  5: Supervision;With upper extremity assist;From chair/3-in-1;From bed;4: Min guard;4: Min assist;Without upper extremity assist    Stand to Sit  5: Supervision;With upper extremity assist;To bed;To chair/3-in-1      Ambulation/Gait   Ambulation/Gait  Yes    Ambulation/Gait Assistance  5: Supervision    Ambulation Distance (Feet)  --   around gym with session   Assistive device  Straight cane    Gait Pattern  Step-through pattern;Decreased step length - right;Decreased step length - left    Ambulation Surface  Level;Indoor      Neuro Re-ed    Neuro Re-ed Details   for strengthening/muscle re-ed: seated at edge of mat- sit<>stands with PTA using red band  resistance to keep left LE in more neutral position/decrease IR for 10 reps, then placed pt's right foot on 2 inch box for 10 more reps, PTA continuing to keep left LE in place. UE assist needed with cues/facilitation for midline as pt tends to keep weight over right UE and for increased anterior weight shifting to prevent retropulsion/posterior lean with standing.      Exercises   Exercises  Other Exercises    Other Exercises   in hook lying on mat table: bridges while holding red band pulled tight for 2 sets of 10 reps, then bridges with yoga block squeeze for 2 sets of 10 reps; with red band around knees- left hip fall outs for 2 sets of 10 reps. cues on correct ex form/technique             PT Short Term Goals - 11/13/19  1921      PT SHORT TERM GOAL #1   Title  Patient to be independent with initial HEP.    Baseline  10/17/19: met per pt and spouse report    Status  Achieved      PT SHORT TERM GOAL #2   Title  Pt will improve TUG to less than 20 sec    Baseline  10/23/19: 22.22 sec's with cane, improved from 25 sec's just not to goal.    Time  4    Period  Weeks    Status  Partially Met    Target Date  11/19/19      PT SHORT TERM GOAL #3   Title  Pt will improve BERG to more than 40    Baseline  10/17/19: 44/56 scored today    Status  Achieved      PT SHORT TERM GOAL #4   Title  Pt will be instructed in ways to better manage left hip pain for improved ability to participate with activities.    Time  4    Period  Weeks    Status  New    Target Date  11/19/19        PT Long Term Goals - 11/13/19 1043      PT LONG TERM GOAL #1   Title  Patient to be independent with advanced HEP. (Target for LTG 12 weeks 12/12/19)    Time  12    Period  Weeks    Status  New      PT LONG TERM GOAL #2   Title  Pt will improve TUG to less than 17 seconds    Baseline  25    Time  12    Period  Weeks    Status  New      PT LONG TERM GOAL #3   Title  Patient to score <20 sec on 5xSTS without UE support to decrease risk of falls.    Baseline  38    Time  12    Period  Weeks    Status  New      PT LONG TERM GOAL #4   Title  Patient to improve gait speed to 2 ft/sec with LRAD    Baseline  1.81f/sec on 11/13/2019 with SPC    Time  12    Period  Weeks    Status  New      PT LONG TERM GOAL #5   Title  Pt to ambulate limited community distances mod I at least 450 ft with LRAD and negotiate up/down one flight of stairs.  Time  12    Period  Weeks    Status  New      PT LONG TERM GOAL #6   Title  Pt will improve BERG to at least 45 to show improved balance    Baseline  42/56 on 11/13/2019    Time  12    Period  Weeks    Status  On-going            Plan - 11/15/19 1105    Clinical Impression  Statement  Today's skilled session continued to focus on LE strengthening and sit<>stand technique. Pt continues to need cues/facilitation for increased anterior weight shifting to decrease posterior lean and retropulsion on mat table. Will need continued work on this. The tp is making steady progress toward goals and should benefit from continued PT to progress toward unmet goals.    Personal Factors and Comorbidities  Comorbidity 3+    Examination-Activity Limitations  Locomotion Level;Transfers;Reach Overhead;Carry;Squat;Stairs;Lift;Stand    Examination-Participation Restrictions  Meal Prep;Cleaning;Community Activity;Driving;Laundry;Shop    Stability/Clinical Decision Making  Evolving/Moderate complexity    Rehab Potential  Good    PT Frequency  2x / week    PT Duration  12 weeks    PT Treatment/Interventions  Passive range of motion;Iontophoresis '4mg'$ /ml Dexamethasone;Ultrasound;Electrical Stimulation;Cryotherapy;Manual techniques;Therapeutic exercise;Therapeutic activities;Stair training;Gait training;Balance training;Neuromuscular re-education    PT Next Visit Plan  work on sit to stand technique keeping left LE in neutral as it tends to IR (could be cause of some of her hip pain); left LE strengthening targeting hip muscles when pain allows, balance and gait.    PT Home Exercise Plan  Access Code: [email or web address]    Consulted and Agree with Plan of Care  Patient       Patient will benefit from skilled therapeutic intervention in order to improve the following deficits and impairments:  Abnormal gait, Decreased activity tolerance, Decreased balance, Decreased coordination, Decreased endurance, Decreased mobility, Decreased range of motion, Decreased strength, Difficulty walking, Impaired UE functional use, Postural dysfunction, Improper body mechanics  Visit Diagnosis: Hemiplegia and hemiparesis following cerebral infarction affecting left non-dominant side (HCC)  Unsteadiness on  feet     Problem List Patient Active Problem List   Diagnosis Date Noted  . 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 Anticoagulated on warfarin   . Left hemiparesis ([place])   . History of CVA with residual deficit   . Thrombocytopenia ([place])   . Prediabetes   . CVA (cerebral vascular accident) ([place]) 09/03/2019  . Vertigo 09/24/2017  . Benign paroxysmal positional vertigo   . History of stroke   . Hyperlipemia 08/23/2017  . Encounter for therapeutic drug monitoring 08/08/2017  . Long term (current) use of anticoagulants [Z79.01] 07/12/2017  . Hemiparesis and alteration of sensations as late effects of stroke ([place]) 09/07/2016  . Abnormality of gait 09/07/2016  . HTN (hypertension) 04/13/2016  . H/O mitral valve replacement with mechanical valve 10/16/2015  . Diabetes mellitus without complication ([place])   . Hemiparesis (L sided - mild) due to old stroke ([place])     Willow Ora, PTA, [facility] [address] [place], [place] [address] [phone] 11/16/19, 6:30 PM   Name: [name] MRN: [id number] Date of Birth: [birth year]

## 2019-11-19 ENCOUNTER — Ambulatory Visit (INDEPENDENT_AMBULATORY_CARE_PROVIDER_SITE_OTHER): Payer: Medicare Other | Admitting: Internal Medicine

## 2019-11-19 DIAGNOSIS — Z5181 Encounter for therapeutic drug level monitoring: Secondary | ICD-10-CM | POA: Diagnosis not present

## 2019-11-19 LAB — POCT INR: INR: 3.4 — AB (ref 2.0–3.0)

## 2019-11-19 NOTE — Patient Instructions (Signed)
Description   Continue same dosage 5mg  daily except 7.5mg  on Mondays and Fridays. Pt will continue greens with 2 cups of lettuce on Fridays. Does weekly checks due to insurance.

## 2019-11-20 ENCOUNTER — Ambulatory Visit: Payer: Medicare Other

## 2019-11-20 ENCOUNTER — Ambulatory Visit: Payer: Medicare Other | Admitting: Occupational Therapy

## 2019-11-20 ENCOUNTER — Other Ambulatory Visit: Payer: Self-pay

## 2019-11-20 DIAGNOSIS — I69354 Hemiplegia and hemiparesis following cerebral infarction affecting left non-dominant side: Secondary | ICD-10-CM | POA: Diagnosis not present

## 2019-11-20 DIAGNOSIS — R2689 Other abnormalities of gait and mobility: Secondary | ICD-10-CM | POA: Diagnosis not present

## 2019-11-20 DIAGNOSIS — M6281 Muscle weakness (generalized): Secondary | ICD-10-CM | POA: Diagnosis not present

## 2019-11-20 DIAGNOSIS — R293 Abnormal posture: Secondary | ICD-10-CM

## 2019-11-20 DIAGNOSIS — M25552 Pain in left hip: Secondary | ICD-10-CM | POA: Diagnosis not present

## 2019-11-20 DIAGNOSIS — R2681 Unsteadiness on feet: Secondary | ICD-10-CM | POA: Diagnosis not present

## 2019-11-20 DIAGNOSIS — R482 Apraxia: Secondary | ICD-10-CM | POA: Diagnosis not present

## 2019-11-20 NOTE — Therapy (Signed)
[facility] [address] [place], [place], [address] Phone: [phone]   Fax:  [phone]  Physical Therapy Treatment  Patient Details  Name: [name] MRN: [id number] Date of Birth: 1946/02/16 Referring Provider (PT): [name], MD   Encounter Date: 11/20/2019  PT End of Session - 11/20/19 1106    Visit Number  12    Number of Visits  24    Date for PT Re-Evaluation  12/12/19    Authorization Type  MCR    PT Start Time  1104    PT Stop Time  1150    PT Time Calculation (min)  46 min    Equipment Utilized During Treatment  Gait belt    Activity Tolerance  Patient tolerated treatment well;No increased pain    Behavior During Therapy  WFL for tasks assessed/performed       Past Medical History:  Diagnosis Date  . Abnormality of gait 09/07/2016  . Allergy   . Diabetes mellitus without complication [facility])    Patient denies this - notes history of glucose intolerance  . Hemiparesis and alteration of sensations as late effects of stroke ([place]) 09/07/2016  . History of pneumonia 1997  . S/P MVR (mitral valve replacement)    Mechanical mitral valve replacement at age 28 (done in [place])  // echo 7/17: EF 55-60, normal wall motion, bileaflet mechanical mitral valve prosthesis functioning normally, mild LAE, mildly reduced RVSF, small pericardial effusion  . Stroke [facility]) 1997, 2013, 2015    Past Surgical History:  Procedure Laterality Date  . ABDOMINAL HYSTERECTOMY    . BRAIN SURGERY    . BUNIONECTOMY  1993  . CARDIAC VALVE REPLACEMENT  1997  . TOE SURGERY  1996    There were no vitals filed for this visit.  Subjective Assessment - 11/20/19 1106    Subjective  Pt reports that she has good days and bad days with left leg but more good days. Yesterday was acting up more.    Patient is accompained by:  Family member   spouse   How long can you sit comfortably?  1-2 min    Diagnostic tests  MRI  showed old hemorrhagic infarct in the right parietal cortical and subcortical brain which had progressed atrophy, encephalomalacia.  Suspicion of minor acute extension along the anterior margin of the old infarction affecting the deep white matter and small focus adjacent cortical brain.    Currently in Pain?  Yes    Pain Score  1     Pain Location  Hip    Pain Orientation  Left    Pain Descriptors / Indicators  Aching    Pain Type  Chronic pain                       [facility] Adult PT Treatment/Exercise - 11/20/19 1116      Ambulation/Gait   Ambulation/Gait  Yes    Ambulation/Gait Assistance  5: Supervision    Ambulation Distance (Feet)  115 Feet    Assistive device  Straight cane   left AFO   Gait Pattern  Step-through pattern;Decreased stance time - left;Decreased step length - right;Decreased step length - left    Ambulation Surface  Level;Indoor      Neuro Re-ed    Neuro Re-ed Details   In // bars: backwards gait and forwards march 6' x 2 each with bilateral UE support. Pt did not feel as steady with backwards  gait. Side stepping along bars 6' x 6 with verbal cues to control left leg with movement. Standing on airex x 30 sec without UE support then with adding arm movements overhead/out/across chest x 10 with CGA. Pt reported feeling very unsteady after getting off airex. 3/10 pain in left hip area at end of session.      Exercises   Exercises  Other Exercises    Other Exercises   Sit to stand x 5 with red theraband around thighs with verbal cuing to keep left knee from adducting/IR with rising with no UE support. CGA for safety. Sit to stand x 5 from mat with 2" step under right foot to try to facilitate more left weight shift and PT stabilizing at left knee to prevent IR. Pt used right UE to push on mat to help with rising. Verbal cues to control descent which was difficult for patient with leaning away from left leg. Hooklying: bridges with red theraband around thighs x  10 with verbal cues to keep knees apart, bridge with bilateral bent knee fallouts against resistance band x 10. Step-ups on 4" step with left LE x 10 in // bars with light UE support and verbal and visual cues in mirror to keep left knee straight and shift weight over leg. Lateral step-ups x 5 on left with bilateral UE support.             PT Education - 11/20/19 1204    Education Details  Added bridges with bent knee fallouts with red theraband to HEP    Person(s) Educated  Patient;Spouse    Methods  Explanation;Demonstration;Handout    Comprehension  Verbalized understanding;Returned demonstration       PT Short Term Goals - 11/13/19 1921      PT SHORT TERM GOAL #1   Title  Patient to be independent with initial HEP.    Baseline  10/17/19: met per pt and spouse report    Status  Achieved      PT SHORT TERM GOAL #2   Title  Pt will improve TUG to less than 20 sec    Baseline  10/23/19: 22.22 sec's with cane, improved from 25 sec's just not to goal.    Time  4    Period  Weeks    Status  Partially Met    Target Date  11/19/19      PT SHORT TERM GOAL #3   Title  Pt will improve BERG to more than 40    Baseline  10/17/19: 44/56 scored today    Status  Achieved      PT SHORT TERM GOAL #4   Title  Pt will be instructed in ways to better manage left hip pain for improved ability to participate with activities.    Time  4    Period  Weeks    Status  New    Target Date  11/19/19        PT Long Term Goals - 11/13/19 1043      PT LONG TERM GOAL #1   Title  Patient to be independent with advanced HEP. (Target for LTG 12 weeks 12/12/19)    Time  12    Period  Weeks    Status  New      PT LONG TERM GOAL #2   Title  Pt will improve TUG to less than 17 seconds    Baseline  25    Time  12    Period  Weeks    Status  New      PT LONG TERM GOAL #3   Title  Patient to score <20 sec on 5xSTS without UE support to decrease risk of falls.    Baseline  38    Time  12     Period  Weeks    Status  New      PT LONG TERM GOAL #4   Title  Patient to improve gait speed to 2 ft/sec with LRAD    Baseline  1.54f/sec on 11/13/2019 with SPC    Time  12    Period  Weeks    Status  New      PT LONG TERM GOAL #5   Title  Pt to ambulate limited community distances mod I at least 450 ft with LRAD and negotiate up/down one flight of stairs.    Time  12    Period  Weeks    Status  New      PT LONG TERM GOAL #6   Title  Pt will improve BERG to at least 45 to show improved balance    Baseline  42/56 on 11/13/2019    Time  12    Period  Weeks    Status  On-going            Plan - 11/20/19 1208    Clinical Impression Statement  PT continued to focus more on left hip control/strengthening with functional activities. Impaired sensation on left leg makes patient very reliant on vision to know where leg is in space. Will benefit from continued PT to continue to focus on strength, balance and gait as well as pain management of left hip area.    Personal Factors and Comorbidities  Comorbidity 3+    Examination-Activity Limitations  Locomotion Level;Transfers;Reach Overhead;Carry;Squat;Stairs;Lift;Stand    Examination-Participation Restrictions  Meal Prep;Cleaning;Community Activity;Driving;Laundry;Shop    Stability/Clinical Decision Making  Evolving/Moderate complexity    Rehab Potential  Good    PT Frequency  2x / week    PT Duration  12 weeks    PT Treatment/Interventions  Passive range of motion;Iontophoresis '4mg'$ /ml Dexamethasone;Ultrasound;Electrical Stimulation;Cryotherapy;Manual techniques;Therapeutic exercise;Therapeutic activities;Stair training;Gait training;Balance training;Neuromuscular re-education    PT Next Visit Plan  Check remaining STG. Add more visits 2x/week for 8 weeks with OT pending OT plan.  work on sit to stand technique keeping left LE in neutral as it tends to IR (could be cause of some of her hip pain); left LE strengthening targeting hip muscles  when pain allows, balance and gait.    PT Home Exercise Plan  Access Code: [email or web address]    Consulted and Agree with Plan of Care  Patient       Patient will benefit from skilled therapeutic intervention in order to improve the following deficits and impairments:  Abnormal gait, Decreased activity tolerance, Decreased balance, Decreased coordination, Decreased endurance, Decreased mobility, Decreased range of motion, Decreased strength, Difficulty walking, Impaired UE functional use, Postural dysfunction, Improper body mechanics  Visit Diagnosis: Abnormal posture  Muscle weakness (generalized)     Problem List Patient Active Problem List   Diagnosis Date Noted  . [place]cortical infarction ([place] 09/05/2019  . Anticoagulated on warfarin   . Left hemiparesis ([place]   . History of CVA with residual deficit   . Thrombocytopenia ([place]   . Prediabetes   . CVA (cerebral vascular accident) ([place] 09/03/2019  . Vertigo 09/24/2017  . Benign paroxysmal positional vertigo   . History of stroke   . Hyperlipemia  08/23/2017  . Encounter for therapeutic drug monitoring 08/08/2017  . Long term (current) use of anticoagulants [Z79.01] 07/12/2017  . Hemiparesis and alteration of sensations as late effects of stroke ([place]) 09/07/2016  . Abnormality of gait 09/07/2016  . HTN (hypertension) 04/13/2016  . H/O mitral valve replacement with mechanical valve 10/16/2015  . Diabetes mellitus without complication ([place])   . Hemiparesis (L sided - mild) due to old stroke ([place])     Electa Sniff, PT, DPT, NCS 11/20/2019, 12:13 PM  [facility] [address] [place], [place], [address] Phone: [phone]   Fax:  [phone]  Name: [name] MRN: [id number] Date of Birth: 1945-11-25

## 2019-11-20 NOTE — Patient Instructions (Signed)
Access Code: [email or web address]  URL: [email or web address]  Date: 11/20/2019  Prepared by: Cherly Anderson   Exercises Backward Walking with Counter Support - 3-5 reps - 1 sets - 1x daily - 5x weekly Side Stepping with Counter Support - 3-5 reps - 1 sets - 1x daily - 5x weekly Standing Balance with Eyes Closed - 3 reps - 1 sets - 20 sec hold - 1x daily - 5x weekly Standing Tandem Balance with Counter Support - 3 reps - 1 sets - 30 hold - 2x daily - 6x weekly Standing March with Unilateral Counter Support - 10 reps - 3 sets                            - 2x daily - 6x weekly Forward Step Up - 10 reps - 1 sets - 1x daily - 5x weekly Sit to Stand without Arm Support - 10 reps - 1-2 sets - 2x daily - 6x weekly Standing Horizontal Saccades - 10 reps - 1 sets - 1x daily - 5x weekly Seated Vertical Saccades - 10 reps - 1 sets - 1x daily - 5x weekly Seated Gaze Stabilization with Head Rotation - 3 reps - 1 sets - 20 hold - 1x daily - 5x weekly Seated Gaze Stabilization with Head Nod - 3 reps - 1 sets - 20 hold - 1x daily - 5x weekly Bridge with Hip Abduction and Resistance - 10 reps - 2 sets - 1x daily - 5x weekly

## 2019-11-22 ENCOUNTER — Ambulatory Visit: Payer: Medicare Other | Admitting: Physical Therapy

## 2019-11-22 ENCOUNTER — Other Ambulatory Visit: Payer: Self-pay

## 2019-11-22 ENCOUNTER — Encounter: Payer: Self-pay | Admitting: Physical Therapy

## 2019-11-22 ENCOUNTER — Encounter: Payer: Medicare Other | Admitting: Occupational Therapy

## 2019-11-22 DIAGNOSIS — R293 Abnormal posture: Secondary | ICD-10-CM

## 2019-11-22 DIAGNOSIS — M6281 Muscle weakness (generalized): Secondary | ICD-10-CM | POA: Diagnosis not present

## 2019-11-22 DIAGNOSIS — R482 Apraxia: Secondary | ICD-10-CM | POA: Diagnosis not present

## 2019-11-22 DIAGNOSIS — I69354 Hemiplegia and hemiparesis following cerebral infarction affecting left non-dominant side: Secondary | ICD-10-CM

## 2019-11-22 DIAGNOSIS — R2689 Other abnormalities of gait and mobility: Secondary | ICD-10-CM | POA: Diagnosis not present

## 2019-11-22 DIAGNOSIS — M25552 Pain in left hip: Secondary | ICD-10-CM | POA: Diagnosis not present

## 2019-11-22 DIAGNOSIS — R2681 Unsteadiness on feet: Secondary | ICD-10-CM

## 2019-11-23 ENCOUNTER — Encounter: Payer: Self-pay | Admitting: Physical Medicine & Rehabilitation

## 2019-11-23 ENCOUNTER — Encounter: Payer: Medicare Other | Attending: Registered Nurse | Admitting: Physical Medicine & Rehabilitation

## 2019-11-23 VITALS — BP 113/75 | HR 79 | Temp 97.0°F | Ht 67.5 in | Wt 126.0 lb

## 2019-11-23 DIAGNOSIS — I6930 Unspecified sequelae of cerebral infarction: Secondary | ICD-10-CM | POA: Diagnosis not present

## 2019-11-23 DIAGNOSIS — I639 Cerebral infarction, unspecified: Secondary | ICD-10-CM | POA: Insufficient documentation

## 2019-11-23 DIAGNOSIS — I10 Essential (primary) hypertension: Secondary | ICD-10-CM | POA: Diagnosis not present

## 2019-11-23 DIAGNOSIS — G8194 Hemiplegia, unspecified affecting left nondominant side: Secondary | ICD-10-CM

## 2019-11-23 DIAGNOSIS — M545 Low back pain: Secondary | ICD-10-CM | POA: Insufficient documentation

## 2019-11-23 NOTE — Progress Notes (Signed)
Subjective:    Patient ID: [name], female    DOB: [birth year], 74 y.o.   MRN: [id number]  HPI  Chief complaint is hip pain and back pain  74 year old female with remote stroke back in 1997 who had a right MCA distribution infarct in October 2020.  She underwent inpatient rehab and was discharged 09/15/2019 from [facility].  She has been receiving outpatient therapies through neuro rehab at [facility] Does better with shoe lift, Left side , hips feel more even, pt not sure thickness    Unable to to quadriped due to Left leg sensation deficit  Discussed bruising, left arm  The patient underwent lumbar x-ray which demonstrated severe levoscoliosis we discussed that this is the reason for her functional leg length discrepancy dost other methods of correcting leg length discrepancy including built-up sole of the shoe.  She does not wish to pursue this at the current time.  The patient would like to get a massage for her chronic low back pain.  She does not have any risk factors.  She is on anticoagulation but this should not pose any problem for hematoma formation.  She does not have any signs or symptoms of DVT Pain Inventory Average Pain 0 Pain Right Now 0 My pain is na  In the last 24 hours, has pain interfered with the following? General activity 0 Relation with others 0 Enjoyment of life 0 What TIME of day is your pain at its worst? na Sleep (in general) Fair  Pain is worse with: sitting and standing Pain improves with: heat/ice and medication Relief from Meds: 9  Mobility use a cane ability to climb steps?  yes  Function not employed: date last employed .  Neuro/Psych tremor dizziness  Prior Studies Any changes since last visit?  no CLINICAL DATA:  Lumbar pain.  EXAM: LUMBAR SPINE - 2-3 VIEW  COMPARISON:  None.  FINDINGS: There is moderate to severe levoscoliosis of the lumbar spine centered at the L2-L3 level. Multilevel degenerative changes are noted  throughout the lumbar spine, greatest at the L3-L4 and L2-L3 levels. There is no displaced fracture. No dislocation.  IMPRESSION: Moderate to severe levoscoliosis of the lumbar spine with multilevel degenerative changes in the lumbar spine. No displaced fracture or dislocation.   Electronically Signed   By: Constance Holster M.D.   On: 10/26/2019 19:14 Physicians involved in your care Any changes since last visit?  no   Family History  Problem Relation Age of Onset  . Cancer Mother        Bone  . Heart disease Mother   . Hyperlipidemia Mother   . Hypertension Mother   . Stroke Father   . Hypertension Father   . Heart attack Neg Hx    Social History   Socioeconomic History  . Marital status: Married    Spouse name: Not on file  . Number of children: 2  . 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 Highest education level: Not on file  Occupational History  . Occupation: Retired  Tobacco Use  . Smoking status: Never Smoker  . Smokeless tobacco: Never Used  Substance and Sexual Activity  . Alcohol use: No    Alcohol/week: 0.0 standard drinks  . Drug use: No  . Sexual activity: Not on file    Comment: Married  Other Topics Concern  . Not on file  Social History Narrative   Lives at home w/ her husband   Right-handed   Caffeine:  none   Social Determinants of Health   Financial Resource Strain:   . Difficulty of Paying Living Expenses: Not on file  Food Insecurity:   . Worried About Charity fundraiser in the Last Year: Not on file  . Ran Out of Food in the Last Year: Not on file  Transportation Needs:   . Lack of Transportation (Medical): Not on file  . Lack of Transportation (Non-Medical): Not on file  Physical Activity:   . Days of Exercise per Week: Not on file  . Minutes of Exercise per Session: Not on file  Stress:   . Feeling of Stress : Not on file  Social Connections:   . Frequency of Communication with Friends and Family: Not on file  .  Frequency of Social Gatherings with Friends and Family: Not on file  . Attends Religious Services: Not on file  . Active Member of Clubs or Organizations: Not on file  . Attends Archivist Meetings: Not on file  . Marital Status: Not on file   Past Surgical History:  Procedure Laterality Date  . ABDOMINAL HYSTERECTOMY    . BRAIN SURGERY    . BUNIONECTOMY  1993  . CARDIAC VALVE REPLACEMENT  1997  . TOE SURGERY  1996   Past Medical History:  Diagnosis Date  . Abnormality of gait 09/07/2016  . Allergy   . Diabetes mellitus without complication [facility])    Patient denies this - notes history of glucose intolerance  . Hemiparesis and alteration of sensations as late effects of stroke ([place]) 09/07/2016  . History of pneumonia 1997  . S/P MVR (mitral valve replacement)    Mechanical mitral valve replacement at age 7 (done in [place])  // echo 7/17: EF 55-60, normal wall motion, bileaflet mechanical mitral valve prosthesis functioning normally, mild LAE, mildly reduced RVSF, small pericardial effusion  . Stroke ([place]) 1997, 2013, 2015   BP 113/75   Pulse 79   Temp (!) 97 F (36.1 C)   Ht 5' 7.5" (1.715 m)   Wt 126 lb (57.2 kg)   SpO2 97%   BMI 19.44 kg/m   Opioid Risk Score:   Fall Risk Score:  `1  Depression screen PHQ 2/9  Depression screen [facility] 2/9 10/22/2019 09/25/2019 07/03/2015  Decreased Interest 0 0 0  Down, Depressed, Hopeless 0 0 0  PHQ - 2 Score 0 0 0     Review of Systems  Constitutional: Negative.   HENT: Negative.   Eyes: Negative.   Respiratory: Negative.   Cardiovascular: Negative.   Gastrointestinal: Negative.   Endocrine: Negative.   Genitourinary: Negative.   Musculoskeletal: Negative.   Skin: Negative.   Allergic/Immunologic: Negative.   Neurological: Positive for dizziness and tremors.  Hematological: Bruises/bleeds easily.  Psychiatric/Behavioral: Negative.   All other systems reviewed and are negative.      Objective:   Physical  Exam Vitals and nursing note reviewed.  Constitutional:      Appearance: Normal appearance.  HENT:     Head: Normocephalic and atraumatic.  Eyes:     Extraocular Movements: Extraocular movements intact.     Conjunctiva/sclera: Conjunctivae normal.     Pupils: Pupils are equal, round, and reactive to light.  Neurological:     Mental Status: She is alert.    Motor strength is 3 - at the left deltoid 4 - at the bicep 3 - tricep 3 - finger flexors and extensors on the left Left lower extremity is 3 - hip flexor  4 at the knee extensor and wearing AFO Ambulates with AFO without assistive device short step length widened base of support.  No evidence of toe drag. Back has levoscoliosis no tenderness palpation in the lumbar paraspinal area She has reduced lumbar range of motion flexion is 75% extension is to 25% lateral bending is 25%. Negative straight leg raising bilaterally Sensation is reduced to light touch in the left upper extremity and left lower extremity.  She has tactile extinction on the left side with double simultaneous touch. No evidence of visual neglect. Lower extremities without swelling no calf tenderness negative Homans     Assessment & Plan:  1.  Right MCA distribution infarct with left hemiparesis, this represents an extension of her infarct in the right frontoparietal lobes Overall making excellent progress.  She is modified independent with her mobility. She continues to benefit from outpatient therapy. Some of her sensory deficits are related to prior stroke so I do not expect that they will improve. Patient will continue outpatient therapy through the end of the month and then continue with home exercise program I plan to see the patient back in approximately 3 months

## 2019-11-23 NOTE — Therapy (Signed)
[facility] [address] [place], [place], [address] Phone: [phone]   Fax:  [phone]  Physical Therapy Treatment  Patient Details  Name: [name] MRN: [id number] Date of Birth: August 18, 1946 Referring Provider (PT): Charlett Blake, MD   Encounter Date: 11/22/2019     11/22/19 1108  PT Visits / Re-Eval  Visit Number 13  Number of Visits 24  Date for PT Re-Evaluation 12/12/19  Authorization  Authorization Type MCR  PT Time Calculation  PT Start Time 1103  PT Stop Time 1146  PT Time Calculation (min) 43 min  PT - End of Session  Equipment Utilized During Treatment Gait belt  Activity Tolerance Patient tolerated treatment well;No increased pain  Behavior During Therapy WFL for tasks assessed/performed    Past Medical History:  Diagnosis Date  . Abnormality of gait 09/07/2016  . Allergy   . Diabetes mellitus without complication [facility])    Patient denies this - notes history of glucose intolerance  . Hemiparesis and alteration of sensations as late effects of stroke ([place]) 09/07/2016  . History of pneumonia 1997  . S/P MVR (mitral valve replacement)    Mechanical mitral valve replacement at age 27 (done in [place])  // echo 7/17: EF 55-60, normal wall motion, bileaflet mechanical mitral valve prosthesis functioning normally, mild LAE, mildly reduced RVSF, small pericardial effusion  . Stroke [facility]) 1997, 2013, 2015    Past Surgical History:  Procedure Laterality Date  . ABDOMINAL HYSTERECTOMY    . BRAIN SURGERY    . BUNIONECTOMY  1993  . CARDIAC VALVE REPLACEMENT  1997  . TOE SURGERY  1996    There were no vitals filed for this visit.      11/22/19 1106  Symptoms/Limitations  Subjective No new complaints.No falls. Had increased pain in left leg yesterday, not sure what caused it. Better today.  Patient is accompained by: Family member (spouse)  How long can you sit comfortably? 1-2 min   Diagnostic tests MRI showed old hemorrhagic infarct in the right parietal cortical and subcortical brain which had progressed atrophy, encephalomalacia.  Suspicion of minor acute extension along the anterior margin of the old infarction affecting the deep white matter and small focus adjacent cortical brain.  Pain Assessment  Currently in Pain? Yes  Pain Location Hip  Pain Orientation Left  Pain Descriptors / Indicators Aching;Sore  Pain Type Chronic pain  Pain Onset More than a month ago  Pain Frequency Intermittent  Aggravating Factors  with sititng down mostly, sometimes in standing  Pain Relieving Factors bengay, tylenol, ice massage       11/22/19 1124  Ambulation/Gait  Ambulation/Gait Yes  Ambulation/Gait Assistance 5: Supervision  Ambulation/Gait Assistance Details manual facilitation for increased weight shifting and stance time on left LE with gait.   Ambulation Distance (Feet) 50 Feet  Assistive device Straight cane  Gait Pattern Step-through pattern;Decreased stance time - left;Decreased step length - right;Decreased step length - left  Ambulation Surface Level;Indoor  Self-Care  Self-Care Other Self-Care Comments  Other Self-Care Comments  pt able to state ways to manage hip/LE pain at home  Neuro Re-ed   Neuro Re-ed Details  for balance/muscle re-ed: working on getting into tall kneeling with UE support. attempted x1 with max assist with left LE extension preventing obtaining tall kneeling. with 2 person assist able to obtain tall kneeling with pt demo'ing right side weight shifting preference. max assist/cues/facilitation for increasing weight shifting onto left side with second person  to assist with maintaining upright posture/left knee flexion/position.   Exercises  Exercises Other Exercises  Other Exercises  hooklying on mat table: left single leg bridge for 10 reps with assist to stabilize left LE, with red band resistance left Le fall outs for 10 reps, then bridging  x 10 reps while keeping the band pulled tight for 10 reps, yoga block squeeze for bridge x 10 reps with cues for full pelvic lift.        PT Short Term Goals - 11/22/19 1112      PT SHORT TERM GOAL #1   Title  Patient to be independent with initial HEP.    Baseline  10/17/19: met per pt and spouse report    Status  Achieved      PT SHORT TERM GOAL #2   Title  Pt will improve TUG to less than 20 sec    Baseline  10/23/19: 22.22 sec's with cane, improved from 25 sec's just not to goal.    Status  Partially Met    Target Date  11/19/19      PT SHORT TERM GOAL #3   Title  Pt will improve BERG to more than 40    Baseline  10/17/19: 44/56 scored today    Status  Achieved      PT SHORT TERM GOAL #4   Title  Pt will be instructed in ways to better manage left hip pain for improved ability to participate with activities.    Baseline  11/22/19: met in session today- uses ice, stretching, walking and tylenol.    Status  Achieved    Target Date  11/19/19        PT Long Term Goals - 11/13/19 1043      PT LONG TERM GOAL #1   Title  Patient to be independent with advanced HEP. (Target for LTG 12 weeks 12/12/19)    Time  12    Period  Weeks    Status  New      PT LONG TERM GOAL #2   Title  Pt will improve TUG to less than 17 seconds    Baseline  25    Time  12    Period  Weeks    Status  New      PT LONG TERM GOAL #3   Title  Patient to score <20 sec on 5xSTS without UE support to decrease risk of falls.    Baseline  38    Time  12    Period  Weeks    Status  New      PT LONG TERM GOAL #4   Title  Patient to improve gait speed to 2 ft/sec with LRAD    Baseline  1.38f/sec on 11/13/2019 with SPC    Time  12    Period  Weeks    Status  New      PT LONG TERM GOAL #5   Title  Pt to ambulate limited community distances mod I at least 450 ft with LRAD and negotiate up/down one flight of stairs.    Time  12    Period  Weeks    Status  New      PT LONG TERM GOAL #6   Title  Pt  will improve BERG to at least 45 to show improved balance    Baseline  42/56 on 11/13/2019    Time  12    Period  Weeks    Status  On-going  11/22/19 1109  Plan  Clinical Impression Statement Today's skilled session continued to focus on gait, LE strengthening and shifting weight onto left LE. The pt became nauseated in tall kneeling needing to come out of this position. Would benefit from continued work toward this position to address left LE weight bearing and midline orientation. The pt is progressing and should benefit from continued PT to progress toward unmet goals.  Personal Factors and Comorbidities Comorbidity 3+  Examination-Activity Limitations Locomotion Level;Transfers;Reach Overhead;Carry;Squat;Stairs;Lift;Stand  Examination-Participation Restrictions Meal Prep;Cleaning;Community Activity;Driving;Laundry;Shop  Pt will benefit from skilled therapeutic intervention in order to improve on the following deficits Abnormal gait;Decreased activity tolerance;Decreased balance;Decreased coordination;Decreased endurance;Decreased mobility;Decreased range of motion;Decreased strength;Difficulty walking;Impaired UE functional use;Postural dysfunction;Improper body mechanics  Stability/Clinical Decision Making Evolving/Moderate complexity  Rehab Potential Good  PT Frequency 2x / week  PT Duration 12 weeks  PT Treatment/Interventions Passive range of motion;Iontophoresis '4mg'$ /ml Dexamethasone;Ultrasound;Electrical Stimulation;Cryotherapy;Manual techniques;Therapeutic exercise;Therapeutic activities;Stair training;Gait training;Balance training;Neuromuscular re-education  PT Next Visit Plan work on sit to stand technique keeping left LE in neutral as it tends to IR (could be cause of some of her hip pain); left LE strengthening targeting hip muscles when pain allows, balance and gait.  PT Home Exercise Plan Access Code: [email or web address]  Consulted and Agree with Plan of Care Patient         Patient will benefit from skilled therapeutic intervention in order to improve the following deficits and impairments:  Abnormal gait, Decreased activity tolerance, Decreased balance, Decreased coordination, Decreased endurance, Decreased mobility, Decreased range of motion, Decreased strength, Difficulty walking, Impaired UE functional use, Postural dysfunction, Improper body mechanics  Visit Diagnosis: Abnormal posture  Muscle weakness (generalized)  Hemiplegia and hemiparesis following cerebral infarction affecting left non-dominant side (HCC)  Unsteadiness on feet     Problem List Patient Active Problem List   Diagnosis Date Noted  . 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 Anticoagulated on warfarin   . Left hemiparesis ([place])   . History of CVA with residual deficit   . Thrombocytopenia ([place])   . Prediabetes   . CVA (cerebral vascular accident) ([place]) 09/03/2019  . Vertigo 09/24/2017  . Benign paroxysmal positional vertigo   . History of stroke   . Hyperlipemia 08/23/2017  . Encounter for therapeutic drug monitoring 08/08/2017  . Long term (current) use of anticoagulants [Z79.01] 07/12/2017  . Hemiparesis and alteration of sensations as late effects of stroke ([place]) 09/07/2016  . Abnormality of gait 09/07/2016  . HTN (hypertension) 04/13/2016  . H/O mitral valve replacement with mechanical valve 10/16/2015  . Diabetes mellitus without complication ([place])   . Hemiparesis (L sided - mild) due to old stroke ([place])     Willow Ora, PTA, [facility] [address] [place], [place] [address] [phone] 11/23/19, 10:44 PM   Name: SUSSIE MINOR MRN: [id number] Date of Birth: 02-Jan-1946

## 2019-11-26 ENCOUNTER — Ambulatory Visit (INDEPENDENT_AMBULATORY_CARE_PROVIDER_SITE_OTHER): Payer: Medicare Other | Admitting: Internal Medicine

## 2019-11-26 DIAGNOSIS — I69359 Hemiplegia and hemiparesis following cerebral infarction affecting unspecified side: Secondary | ICD-10-CM

## 2019-11-26 DIAGNOSIS — Z952 Presence of prosthetic heart valve: Secondary | ICD-10-CM

## 2019-11-26 DIAGNOSIS — Z7901 Long term (current) use of anticoagulants: Secondary | ICD-10-CM

## 2019-11-26 DIAGNOSIS — Z5181 Encounter for therapeutic drug level monitoring: Secondary | ICD-10-CM

## 2019-11-26 LAB — POCT INR: INR: 3.5 — AB (ref 2.0–3.0)

## 2019-11-26 NOTE — Patient Instructions (Signed)
Description   Continue same dosage 5mg  daily except 7.5mg  on Mondays and Fridays. Pt will continue greens with 2 cups of lettuce on Fridays. Does weekly checks due to insurance.

## 2019-11-27 ENCOUNTER — Other Ambulatory Visit: Payer: Self-pay

## 2019-11-27 ENCOUNTER — Ambulatory Visit: Payer: Medicare Other

## 2019-11-27 ENCOUNTER — Ambulatory Visit: Payer: Medicare Other | Admitting: Occupational Therapy

## 2019-11-27 DIAGNOSIS — M6281 Muscle weakness (generalized): Secondary | ICD-10-CM | POA: Diagnosis not present

## 2019-11-27 DIAGNOSIS — R293 Abnormal posture: Secondary | ICD-10-CM

## 2019-11-27 DIAGNOSIS — R2689 Other abnormalities of gait and mobility: Secondary | ICD-10-CM

## 2019-11-27 DIAGNOSIS — I69354 Hemiplegia and hemiparesis following cerebral infarction affecting left non-dominant side: Secondary | ICD-10-CM

## 2019-11-27 DIAGNOSIS — R270 Ataxia, unspecified: Secondary | ICD-10-CM

## 2019-11-27 DIAGNOSIS — R2681 Unsteadiness on feet: Secondary | ICD-10-CM | POA: Diagnosis not present

## 2019-11-27 DIAGNOSIS — R482 Apraxia: Secondary | ICD-10-CM

## 2019-11-27 DIAGNOSIS — M25552 Pain in left hip: Secondary | ICD-10-CM | POA: Diagnosis not present

## 2019-11-27 NOTE — Therapy (Signed)
[facility] [address] [place], [place], [address] Phone: [phone]   Fax:  [phone]  Occupational Therapy Treatment  Patient Details  Name: [name] MRN: [id number] Date of Birth: July 22, 1946 Referring Provider (OT): Dr. Letta Pate   Encounter Date: 11/27/2019  OT End of Session - 11/27/19 1313    Visit Number  8    Number of Visits  16    Date for OT Re-Evaluation  12/07/19    Authorization Type  medicare will need PN every 10th visit    Authorization Time Period  90 days    Authorization - Visit Number  8    Authorization - Number of Visits  10    OT Start Time  1015    OT Stop Time  1100    OT Time Calculation (min)  45 min    Activity Tolerance  Patient tolerated treatment well    Behavior During Therapy  [facility] for tasks assessed/performed       Past Medical History:  Diagnosis Date  . Abnormality of gait 09/07/2016  . Allergy   . Diabetes mellitus without complication [facility])    Patient denies this - notes history of glucose intolerance  . Hemiparesis and alteration of sensations as late effects of stroke ([place]) 09/07/2016  . History of pneumonia 1997  . S/P MVR (mitral valve replacement)    Mechanical mitral valve replacement at age 53 (done in [place])  // echo 7/17: EF 55-60, normal wall motion, bileaflet mechanical mitral valve prosthesis functioning normally, mild LAE, mildly reduced RVSF, small pericardial effusion  . Stroke [facility]) 1997, 2013, 2015    Past Surgical History:  Procedure Laterality Date  . ABDOMINAL HYSTERECTOMY    . BRAIN SURGERY    . BUNIONECTOMY  1993  . CARDIAC VALVE REPLACEMENT  1997  . TOE SURGERY  1996    There were no vitals filed for this visit.  Subjective Assessment - 11/27/19 1015    Subjective   I took an extra tylenol this morning    Patient is accompanied by:  Family member    Pertinent History  09/02/2019 R CVA ( suspect extension of old infarct  affecting deep white matter and small focus adjacent to cortical brain.  PMH:  prior CVA's first in April of '97, August of 97, fall of 2014, spring of 2015.    Patient Stated Goals  I hope my hand will get better "I want it down and I know I am suppose to swing it more naturally when I walk.  Sometimes I walk into the door frame on my left.    Currently in Pain?  No/denies      Worked on functional reaching and graded grasping and releasing - pt has demo improved w/ this in mid level ranges w/ gross grasp and release.  Progressed to placing large pegs in pegboard (vertical surface) mid range w/ max difficulty manipulating peg in hand for placement and occasional mod assist required due to apraxia, coordination, and abnormal tonal patterns. However, apraxia seemed to be main issue w/ this. Pt required extra time with this                       OT Short Term Goals - 10/31/19 1031      OT SHORT TERM GOAL #1   Title  Pt and husband will be mod I with HEP to address L grip strength and L coordination - 10/31/19 (date  adjused as pt has just now returned for first treatment session)    Status  Achieved      OT SHORT TERM GOAL #2   Title  Pt will be mod I with cutting food on plate AE prn    Status  Achieved   pt and husband to order rocker knife from [employer] #3   Title  Pt will demonstrate understanding of use of button hook for smaller buttons    Status  On-going   awaiting already ordered button hook     OT SHORT TERM GOAL #4   Title  Pt will be mod I with bathing back AE prn    Status  Achieved      OT SHORT TERM GOAL #5   Title  Pt will be supervision for toilet tranfers    Status  Achieved   mod I level (per pt/family report)     OT SHORT TERM GOAL #6   Title  Pt will demonstrate improved grip strength by at least 4 pounds to assist with light housekeeping tasks (baseline = 22)    Status  Achieved   LUE = 29       OT Long Term Goals -  10/31/19 1031      OT LONG TERM GOAL #1   Title  Pt and husband will be mod I with HEP to address LUE strength/funtional use and improved activity tolerance. - 11/28/2019 (date adjusted as pt just now returning for first treatment session)    Status  On-going      OT LONG TERM GOAL #2   Title  Pt will be mod I with toilet transfers    Status  Achieved      OT LONG TERM GOAL #3   Title  Pt will be mod I with simple snack prep at ambulatory level    Status  Achieved      OT LONG TERM GOAL #4   Title  Pt will be mod I with laundry    Status  Achieved      OT LONG TERM GOAL #5   Title  Pt will require no more than supervsion for short shopping trips (i.e. [employer], [employer])    Status  Achieved      OT LONG TERM GOAL #6   Title  Pt will demonstrate improved grip strength by at least 6 pounds to assist with cold snack prep and light housekeeping (baseline = 22)    Status  Achieved   LUE = 29 pounds     OT LONG TERM GOAL #7   Title  Pt will demonstrate overhead reach with LUE to at least 110* of shoulder flexion with LUE to obtain light weight object in overhead cabinet    Status  On-going      OT LONG TERM GOAL #8   Title  Pt and husband will verbalize understanding of driving recommendations    Status  Achieved            Plan - 11/27/19 1314    Clinical Impression Statement  Pt progressing w/ LUE function. 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, cognitive deficits, and dystonias    Occupational performance deficits (Please refer to evaluation for details):  ADL's;IADL's;Leisure;Social Participation    Body Structure / Function / Physical Skills  ADL;Balance;Coordination;Decreased knowledge of use of DME;Dexterity;IADL;GMC;FMC;Endurance;Mobility;Proprioception;ROM;Sensation;Strength;Tone;UE functional use    Rehab Potential  Good    OT Frequency  2x /  week    OT Duration  8 weeks    OT Treatment/Interventions  Self-care/ADL training;Aquatic Therapy;Moist Heat;Therapeutic  exercise;Neuromuscular education;DME and/or AE instruction;Passive range of motion;Manual Therapy;Therapist, nutritional;Therapeutic activities;Visual/perceptual remediation/compensation;Patient/family education;Balance training    Plan  begin checking goals (anticipate d/c by end of next week), continue functional use of LUE    Consulted and Agree with Plan of Care  Patient;Family member/caregiver    Family Member Consulted  husband       Patient will benefit from skilled therapeutic intervention in order to improve the following deficits and impairments:   Body Structure / Function / Physical Skills: ADL, Balance, Coordination, Decreased knowledge of use of DME, Dexterity, IADL, GMC, FMC, Endurance, Mobility, Proprioception, ROM, Sensation, Strength, Tone, UE functional use       Visit Diagnosis: Hemiplegia and hemiparesis following cerebral infarction affecting left non-dominant side (HCC)  Apraxia  Ataxia  Abnormal posture    Problem List Patient Active Problem List   Diagnosis Date Noted  . 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 Anticoagulated on warfarin   . Left hemiparesis ([place])   . History of CVA with residual deficit   . Thrombocytopenia ([place])   . Prediabetes   . CVA (cerebral vascular accident) ([place]) 09/03/2019  . Vertigo 09/24/2017  . Benign paroxysmal positional vertigo   . History of stroke   . Hyperlipemia 08/23/2017  . Encounter for therapeutic drug monitoring 08/08/2017  . Long term (current) use of anticoagulants [Z79.01] 07/12/2017  . Hemiparesis and alteration of sensations as late effects of stroke ([place]) 09/07/2016  . Abnormality of gait 09/07/2016  . HTN (hypertension) 04/13/2016  . H/O mitral valve replacement with mechanical valve 10/16/2015  . Diabetes mellitus without complication ([place])   . Hemiparesis (L sided - mild) due to old stroke [facility])     Carey Bullocks, OTR/L 11/27/2019, 2:27 PM  [facility] [address] [place], [place], [address] Phone: [phone]   Fax:  [phone]  Name: FARRELL STORMENT MRN: [id number] Date of Birth: 1946/01/14

## 2019-11-27 NOTE — Therapy (Signed)
[facility] [address] [place], [place], [address] Phone: [phone]   Fax:  [phone]  Physical Therapy Treatment  Patient Details  Name: [name] MRN: [id number] Date of Birth: 03-Jun-1946 Referring Provider (PT): [name], MD   Encounter Date: 11/27/2019  PT End of Session - 11/27/19 1105    Visit Number  14    Number of Visits  24    Date for PT Re-Evaluation  12/12/19    Authorization Type  MCR    PT Start Time  1102    PT Stop Time  1146    PT Time Calculation (min)  44 min    Equipment Utilized During Treatment  Gait belt    Activity Tolerance  Patient tolerated treatment well;No increased pain    Behavior During Therapy  WFL for tasks assessed/performed       Past Medical History:  Diagnosis Date  . Abnormality of gait 09/07/2016  . Allergy   . Diabetes mellitus without complication [facility])    Patient denies this - notes history of glucose intolerance  . Hemiparesis and alteration of sensations as late effects of stroke ([place]) 09/07/2016  . History of pneumonia 1997  . S/P MVR (mitral valve replacement)    Mechanical mitral valve replacement at age 52 (done in [place])  // echo 7/17: EF 55-60, normal wall motion, bileaflet mechanical mitral valve prosthesis functioning normally, mild LAE, mildly reduced RVSF, small pericardial effusion  . Stroke [facility]) 1997, 2013, 2015    Past Surgical History:  Procedure Laterality Date  . ABDOMINAL HYSTERECTOMY    . BRAIN SURGERY    . BUNIONECTOMY  1993  . CARDIAC VALVE REPLACEMENT  1997  . TOE SURGERY  1996    There were no vitals filed for this visit.  Subjective Assessment - 11/27/19 1105    Subjective  Pt reports that she did not enjoy the tall kneeling. She was so afraid of falling and felt really hot after and took a 2 hour nap when got home.    Patient is accompained by:  Family member   spouse   How long can you sit comfortably?  1-2  min    Diagnostic tests  MRI showed old hemorrhagic infarct in the right parietal cortical and subcortical brain which had progressed atrophy, encephalomalacia.  Suspicion of minor acute extension along the anterior margin of the old infarction affecting the deep white matter and small focus adjacent cortical brain.    Currently in Pain?  Yes    Pain Score  1     Pain Location  Hip    Pain Orientation  Right    Pain Descriptors / Indicators  Aching    Pain Type  Chronic pain    Pain Onset  More than a month ago                       [facility] Adult PT Treatment/Exercise - 11/27/19 1106      Ambulation/Gait   Ambulation/Gait  Yes    Ambulation/Gait Assistance  5: Supervision    Ambulation/Gait Assistance Details  PT providing manual facilitation to help weight shift to left    Ambulation Distance (Feet)  345 Feet    Assistive device  Straight cane   left AFO   Gait Pattern  Step-through pattern;Decreased hip/knee flexion - left;Decreased weight shift to left    Ambulation Surface  Level;Indoor      Neuro Re-ed  Neuro Re-ed Details   Standing in front of mirror with right foot on 4" step x 1 min with SPC support and with adding in reaching with left hand then with 10 sec standing without UE support, tapping 4" step with right foot x 10 with verbal and tactile cues to weight shift over left leg and tighten gluts then performed on right LE with PT preventing from leaning too far to right. In // bars: marching gait with 1 UE support 6 ' x 4, forwards and backwards with 1 UE support 6' x 2. Pt was given visual cues in mirror to try to keep left hip out to side more with activities. Attempted to add 2# weight to left LE to see if that would help at all with proprioception but patient could not feel any difference. Gait with head turn left/right x 40' and then with head turns up/down x 40' with SPC CGA. Slower gait speeds with both. Pt reported 2/10 pain in left hip at end.              PT Education - 11/27/19 1302    Education Details  Pt to continue with current HEP    Person(s) Educated  Patient    Methods  Explanation    Comprehension  Verbalized understanding       PT Short Term Goals - 11/22/19 1112      PT SHORT TERM GOAL #1   Title  Patient to be independent with initial HEP.    Baseline  10/17/19: met per pt and spouse report    Status  Achieved      PT SHORT TERM GOAL #2   Title  Pt will improve TUG to less than 20 sec    Baseline  10/23/19: 22.22 sec's with cane, improved from 25 sec's just not to goal.    Status  Partially Met    Target Date  11/19/19      PT SHORT TERM GOAL #3   Title  Pt will improve BERG to more than 40    Baseline  10/17/19: 44/56 scored today    Status  Achieved      PT SHORT TERM GOAL #4   Title  Pt will be instructed in ways to better manage left hip pain for improved ability to participate with activities.    Baseline  11/22/19: met in session today- uses ice, stretching, walking and tylenol.    Status  Achieved    Target Date  11/19/19        PT Long Term Goals - 11/13/19 1043      PT LONG TERM GOAL #1   Title  Patient to be independent with advanced HEP. (Target for LTG 12 weeks 12/12/19)    Time  12    Period  Weeks    Status  New      PT LONG TERM GOAL #2   Title  Pt will improve TUG to less than 17 seconds    Baseline  25    Time  12    Period  Weeks    Status  New      PT LONG TERM GOAL #3   Title  Patient to score <20 sec on 5xSTS without UE support to decrease risk of falls.    Baseline  38    Time  12    Period  Weeks    Status  New      PT LONG TERM GOAL #4  Title  Patient to improve gait speed to 2 ft/sec with LRAD    Baseline  1.64f/sec on 11/13/2019 with SPC    Time  12    Period  Weeks    Status  New      PT LONG TERM GOAL #5   Title  Pt to ambulate limited community distances mod I at least 450 ft with LRAD and negotiate up/down one flight of stairs.    Time  12     Period  Weeks    Status  New      PT LONG TERM GOAL #6   Title  Pt will improve BERG to at least 45 to show improved balance    Baseline  42/56 on 11/13/2019    Time  12    Period  Weeks    Status  On-going            Plan - 11/27/19 1303    Clinical Impression Statement  PT focused on increasing left weight shift with standing activities. Pt tolerated better than with tall kneeling. Pt's poor sensation/lack of proprioception in left leg challenges her. Pt continues to benefit from skilled PT to address strength, balance and functional mobility deficits.    Personal Factors and Comorbidities  Comorbidity 3+    Examination-Activity Limitations  Locomotion Level;Transfers;Reach Overhead;Carry;Squat;Stairs;Lift;Stand    Examination-Participation Restrictions  Meal Prep;Cleaning;Community Activity;Driving;Laundry;Shop    Stability/Clinical Decision Making  Evolving/Moderate complexity    Rehab Potential  Good    PT Frequency  2x / week    PT Duration  12 weeks    PT Treatment/Interventions  Passive range of motion;Iontophoresis 458mml Dexamethasone;Ultrasound;Electrical Stimulation;Cryotherapy;Manual techniques;Therapeutic exercise;Therapeutic activities;Stair training;Gait training;Balance training;Neuromuscular re-education    PT Next Visit Plan  Continue with left LE strengthening focusing on hip, weight shifting to left activities, dynamic gait and balance. Needs to schedule more PT visits. Will plan to recert 2x/week for 8 weeks. Is OT going to continue?    PT Home Exercise Plan  Access Code: [email or web address]    Consulted and Agree with Plan of Care  Patient       Patient will benefit from skilled therapeutic intervention in order to improve the following deficits and impairments:  Abnormal gait, Decreased activity tolerance, Decreased balance, Decreased coordination, Decreased endurance, Decreased mobility, Decreased range of motion, Decreased strength, Difficulty walking, Impaired UE  functional use, Postural dysfunction, Improper body mechanics  Visit Diagnosis: Muscle weakness (generalized)  Abnormal posture  Other abnormalities of gait and mobility     Problem List Patient Active Problem List   Diagnosis Date Noted  . Subcortical infarction ([facility]10/28/2020  . Anticoagulated on warfarin   . Left hemiparesis ([facility]  . History of CVA with residual deficit   . Thrombocytopenia ([facility]  . Prediabetes   . CVA (cerebral vascular accident) ([place]10/26/2020  . Vertigo 09/24/2017  . Benign paroxysmal positional vertigo   . History of stroke   . Hyperlipemia 08/23/2017  . Encounter for therapeutic drug monitoring 08/08/2017  . Long term (current) use of anticoagulants [Z79.01] 07/12/2017  . Hemiparesis and alteration of sensations as late effects of stroke ([facility]CLomas10/31/2017  . Abnormality of gait 09/07/2016  . HTN (hypertension) 04/13/2016  . H/O mitral valve replacement with mechanical valve 10/16/2015  . Diabetes mellitus without complication ([facility]  . Hemiparesis (L sided - mild) due to old stroke ([place]    EmElecta SniffPT, DPT, NCS 11/27/2019, 1:09 PM  [facility] [address] [place], [place], [address] Phone: [phone]   Fax:  [phone]  Name: [name] MRN: [id number] Date of Birth: [birth year]

## 2019-11-29 ENCOUNTER — Ambulatory Visit: Payer: Medicare Other | Admitting: Occupational Therapy

## 2019-11-29 ENCOUNTER — Encounter: Payer: Self-pay | Admitting: Physical Therapy

## 2019-11-29 ENCOUNTER — Other Ambulatory Visit: Payer: Self-pay

## 2019-11-29 ENCOUNTER — Ambulatory Visit: Payer: Medicare Other | Admitting: Physical Therapy

## 2019-11-29 DIAGNOSIS — R2689 Other abnormalities of gait and mobility: Secondary | ICD-10-CM | POA: Diagnosis not present

## 2019-11-29 DIAGNOSIS — R482 Apraxia: Secondary | ICD-10-CM

## 2019-11-29 DIAGNOSIS — I69354 Hemiplegia and hemiparesis following cerebral infarction affecting left non-dominant side: Secondary | ICD-10-CM | POA: Diagnosis not present

## 2019-11-29 DIAGNOSIS — R270 Ataxia, unspecified: Secondary | ICD-10-CM

## 2019-11-29 DIAGNOSIS — M6281 Muscle weakness (generalized): Secondary | ICD-10-CM

## 2019-11-29 DIAGNOSIS — R2681 Unsteadiness on feet: Secondary | ICD-10-CM

## 2019-11-29 DIAGNOSIS — M25552 Pain in left hip: Secondary | ICD-10-CM | POA: Diagnosis not present

## 2019-11-29 NOTE — Therapy (Signed)
[facility] [address] [place], [place], [address] Phone: [phone]   Fax:  [phone]  Occupational Therapy Treatment  Patient Details  Name: [name] MRN: [id number] Date of Birth: [birth year] Referring Provider (OT): Dr. Letta Pate   Encounter Date: 11/29/2019  OT End of Session - 11/29/19 1042    Visit Number  9    Number of Visits  16    Date for OT Re-Evaluation  12/07/19    Authorization Type  medicare will need PN every 10th visit    Authorization Time Period  90 days    Authorization - Visit Number  9    Authorization - Number of Visits  10    OT Start Time  1017    OT Stop Time  1100    OT Time Calculation (min)  43 min    Activity Tolerance  Patient tolerated treatment well    Behavior During Therapy  [facility] for tasks assessed/performed       Past Medical History:  Diagnosis Date  . Abnormality of gait 09/07/2016  . Allergy   . Diabetes mellitus without complication [facility])    Patient denies this - notes history of glucose intolerance  . 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 History of pneumonia 1997  . S/P MVR (mitral valve replacement)    Mechanical mitral valve replacement at age 30 (done in [place])  // echo 7/17: EF 55-60, normal wall motion, bileaflet mechanical mitral valve prosthesis functioning normally, mild LAE, mildly reduced RVSF, small pericardial effusion  . Stroke [facility]) 1997, 2013, 2015    Past Surgical History:  Procedure Laterality Date  . ABDOMINAL HYSTERECTOMY    . BRAIN SURGERY    . BUNIONECTOMY  1993  . CARDIAC VALVE REPLACEMENT  1997  . TOE SURGERY  1996    There were no vitals filed for this visit.  Subjective Assessment - 11/29/19 1022    Patient is accompanied by:  Family member    Pertinent History  09/02/2019 R CVA ( suspect extension of old infarct affecting deep white matter and small focus adjacent to  cortical brain.  PMH:  prior CVA's first in April of '97, August of 97, fall of 2014, spring of 2015.    Patient Stated Goals  I hope my hand will get better "I want it down and I know I am suppose to swing it more naturally when I walk.  Sometimes I walk into the door frame on my left.    Currently in Pain?  Yes    Pain Score  2    Lt hip - O.T. not addressing      Practiced use of button hook - pt initially required max cueing d/t apraxia and cognitive deficits. Pt improved w/ repetition and only needed min cueing.  Functional open chain reaching in mid ranges - pt practicing placing checkers into Connect 4 slot on elevated surface. Pt required cues to prevent sh compensations and for placement. Reviewed ways to practically work on LUE function at home with safe tasks in prep for d/c next week UBE x 8 min for reciprocal movement pattern, however d/c after 5 min due to compensations/tone.                       OT Short Term Goals - 11/29/19 1043      OT SHORT TERM GOAL #1   Title  Pt  and husband will be mod I with HEP to address L grip strength and L coordination - 10/31/19 (date adjused as pt has just now returned for first treatment session)    Status  Achieved      OT SHORT TERM GOAL #2   Title  Pt will be mod I with cutting food on plate AE prn    Status  Achieved   pt and husband to order rocker knife from [employer] #3   Title  Pt will demonstrate understanding of use of button hook for smaller buttons    Status  Achieved   pt practiced in clinic - pt needed cues d/t apraxia but improved w/ repetition     OT SHORT TERM GOAL #4   Title  Pt will be mod I with bathing back AE prn    Status  Achieved      OT SHORT TERM GOAL #5   Title  Pt will be supervision for toilet tranfers    Status  Achieved   mod I level (per pt/family report)     OT SHORT TERM GOAL #6   Title  Pt will demonstrate improved grip strength by at least 4 pounds to  assist with light housekeeping tasks (baseline = 22)    Status  Achieved   LUE = 29       OT Long Term Goals - 10/31/19 1031      OT LONG TERM GOAL #1   Title  Pt and husband will be mod I with HEP to address LUE strength/funtional use and improved activity tolerance. - 11/28/2019 (date adjusted as pt just now returning for first treatment session)    Status  On-going      OT LONG TERM GOAL #2   Title  Pt will be mod I with toilet transfers    Status  Achieved      OT LONG TERM GOAL #3   Title  Pt will be mod I with simple snack prep at ambulatory level    Status  Achieved      OT LONG TERM GOAL #4   Title  Pt will be mod I with laundry    Status  Achieved      OT LONG TERM GOAL #5   Title  Pt will require no more than supervsion for short shopping trips (i.e. [employer], [employer])    Status  Achieved      OT LONG TERM GOAL #6   Title  Pt will demonstrate improved grip strength by at least 6 pounds to assist with cold snack prep and light housekeeping (baseline = 22)    Status  Achieved   LUE = 29 pounds     OT LONG TERM GOAL #7   Title  Pt will demonstrate overhead reach with LUE to at least 110* of shoulder flexion with LUE to obtain light weight object in overhead cabinet    Status  On-going      OT LONG TERM GOAL #8   Title  Pt and husband will verbalize understanding of driving recommendations    Status  Achieved            Plan - 11/29/19 1046    Clinical Impression Statement  Pt has met all STG's and all but 1 LTG at this time. Pt continues to be limited by apraxia, cognitive deficits and abnormal tonal patterns. Pt does show improvement with repetition but difficulty w/  carrying through from day to day    Occupational performance deficits (Please refer to evaluation for details):  ADL's;IADL's;Leisure;Social Participation    Body Structure / Function / Physical Skills  ADL;Balance;Coordination;Decreased knowledge of use of  DME;Dexterity;IADL;GMC;FMC;Endurance;Mobility;Proprioception;ROM;Sensation;Strength;Tone;UE functional use    Rehab Potential  Good    Comorbidities impacting occupational performance description:  see above PMH    OT Frequency  2x / week    OT Duration  8 weeks    OT Treatment/Interventions  Self-care/ADL training;Aquatic Therapy;Moist Heat;Therapeutic exercise;Neuromuscular education;DME and/or AE instruction;Passive range of motion;Manual Therapy;Therapist, nutritional;Therapeutic activities;Visual/perceptual remediation/compensation;Patient/family education;Balance training    Plan  10th progress note, d/c end of next week    Consulted and Agree with Plan of Care  Patient;Family member/caregiver    Family Member Consulted  husband       Patient will benefit from skilled therapeutic intervention in order to improve the following deficits and impairments:   Body Structure / Function / Physical Skills: ADL, Balance, Coordination, Decreased knowledge of use of DME, Dexterity, IADL, GMC, FMC, Endurance, Mobility, Proprioception, ROM, Sensation, Strength, Tone, UE functional use       Visit Diagnosis: Hemiplegia and hemiparesis following cerebral infarction affecting left non-dominant side (HCC)  Apraxia  Ataxia    Problem List Patient Active Problem List   Diagnosis Date Noted  . Subcortical infarction ([place]) 09/05/2019  . Anticoagulated on warfarin   . Left hemiparesis ([place])   . History of CVA with residual deficit   . Thrombocytopenia ([place])   . Prediabetes   . CVA (cerebral vascular accident) ([place]) 09/03/2019  . Vertigo 09/24/2017  . Benign paroxysmal positional vertigo   . History of stroke   . Hyperlipemia 08/23/2017  . Encounter for therapeutic drug monitoring 08/08/2017  . Long term (current) use of anticoagulants [Z79.01] 07/12/2017  . Hemiparesis and alteration of sensations as late effects of stroke ([place]) 09/07/2016  . Abnormality of gait 09/07/2016  . HTN  (hypertension) 04/13/2016  . H/O mitral valve replacement with mechanical valve 10/16/2015  . Diabetes mellitus without complication ([place])   . Hemiparesis (L sided - mild) due to old stroke [facility])     Carey Bullocks, OTR/L 11/29/2019, 2:24 PM  [facility] [address] [place], [place], [address] Phone: [phone]   Fax:  [phone]  Name: MANILA ROMMEL MRN: [id number] Date of Birth: [birth year]

## 2019-11-29 NOTE — Addendum Note (Signed)
Addended by: Debbe Odea on: 11/29/2019 12:43 PM   Modules accepted: Orders

## 2019-11-30 NOTE — Therapy (Signed)
[facility] [address] [place], [place], [address] Phone: [phone]   Fax:  [phone]  Physical Therapy Treatment  Patient Details  Name: [name] MRN: [id number] Date of Birth: 1946-02-19 Referring Provider (PT): Charlett Blake, MD   Encounter Date: 11/29/2019     11/29/19 1103  PT Visits / Re-Eval  Visit Number 15  Number of Visits 24  Date for PT Re-Evaluation 12/12/19  Authorization  Authorization Type MCR  PT Time Calculation  PT Start Time 1101  PT Stop Time 1145  PT Time Calculation (min) 44 min  PT - End of Session  Equipment Utilized During Treatment Gait belt  Activity Tolerance Patient tolerated treatment well;No increased pain  Behavior During Therapy WFL for tasks assessed/performed    Past Medical History:  Diagnosis Date  . Abnormality of gait 09/07/2016  . Allergy   . Diabetes mellitus without complication [facility])    Patient denies this - notes history of glucose intolerance  . Hemiparesis and alteration of sensations as late effects of stroke ([place]) 09/07/2016  . History of pneumonia 1997  . S/P MVR (mitral valve replacement)    Mechanical mitral valve replacement at age 52 (done in [place])  // echo 7/17: EF 55-60, normal wall motion, bileaflet mechanical mitral valve prosthesis functioning normally, mild LAE, mildly reduced RVSF, small pericardial effusion  . Stroke [facility]) 1997, 2013, 2015    Past Surgical History:  Procedure Laterality Date  . ABDOMINAL HYSTERECTOMY    . BRAIN SURGERY    . BUNIONECTOMY  1993  . CARDIAC VALVE REPLACEMENT  1997  . TOE SURGERY  1996    There were no vitals filed for this visit.     11/29/19 1102  Symptoms/Limitations  Subjective No new complaints. No falls to report.  Patient is accompained by: Family member (spouse)  How long can you sit comfortably? 1-2 min  Diagnostic tests MRI showed old hemorrhagic infarct in the right  parietal cortical and subcortical brain which had progressed atrophy, encephalomalacia.  Suspicion of minor acute extension along the anterior margin of the old infarction affecting the deep white matter and small focus adjacent cortical brain.  Pain Assessment  Currently in Pain? Yes  Pain Score 2  Pain Location Hip  Pain Orientation Right  Pain Descriptors / Indicators Aching  Pain Type Chronic pain  Pain Radiating Towards into thigh  Pain Onset More than a month ago  Pain Frequency Intermittent  Aggravating Factors  poor mechanics with transfers/gait  Pain Relieving Factors bengay, tylenol, ice massage      11/29/19 1113  Transfers  Transfers Sit to Stand;Stand to Sit  Sit to Stand 5: Supervision;With upper extremity assist;From chair/3-in-1;From bed;4: Min guard;4: Min assist;Without upper extremity assist  Stand to Sit 5: Supervision;With upper extremity assist;To bed;To chair/3-in-1  Ambulation/Gait  Ambulation/Gait Yes  Ambulation/Gait Assistance 4: Min guard;4: Min assist  Ambulation/Gait Assistance Details faciliation provided with gait for increased shifting onto left LE with cues for increased stance time/increased right step length. had pt slow gait down to accomplish these tasks with improved carryover noted.   Ambulation Distance (Feet) 230 Feet (x1, plus around gym with session. )  Assistive device Straight cane;Other (Comment) (left AFO)  Gait Pattern Step-through pattern;Decreased hip/knee flexion - left;Decreased weight shift to left  Ambulation Surface Level;Indoor        Balance Exercises - 11/29/19 1145      Balance Exercises: Standing   Rockerboard  Anterior/posterior;Lateral;Head turns;EO;EC;30 seconds;10  reps;Limitations    Rockerboard Limitations  performed both ways on balance board: alternating UE raises, progressing to bil UE raises for 5-8 reps each with min guard assist. then had pt hold board steady with EC no head movements, to EC head movements  left<>right, up<>down for 10 reps each with up to min assist for balance needed, light UE touch to bars for balance with single UE.            PT Short Term Goals - 11/22/19 1112      PT SHORT TERM GOAL #1   Title  Patient to be independent with initial HEP.    Baseline  10/17/19: met per pt and spouse report    Status  Achieved      PT SHORT TERM GOAL #2   Title  Pt will improve TUG to less than 20 sec    Baseline  10/23/19: 22.22 sec's with cane, improved from 25 sec's just not to goal.    Status  Partially Met    Target Date  11/19/19      PT SHORT TERM GOAL #3   Title  Pt will improve BERG to more than 40    Baseline  10/17/19: 44/56 scored today    Status  Achieved      PT SHORT TERM GOAL #4   Title  Pt will be instructed in ways to better manage left hip pain for improved ability to participate with activities.    Baseline  11/22/19: met in session today- uses ice, stretching, walking and tylenol.    Status  Achieved    Target Date  11/19/19        PT Long Term Goals - 11/13/19 1043      PT LONG TERM GOAL #1   Title  Patient to be independent with advanced HEP. (Target for LTG 12 weeks 12/12/19)    Time  12    Period  Weeks    Status  New      PT LONG TERM GOAL #2   Title  Pt will improve TUG to less than 17 seconds    Baseline  25    Time  12    Period  Weeks    Status  New      PT LONG TERM GOAL #3   Title  Patient to score <20 sec on 5xSTS without UE support to decrease risk of falls.    Baseline  38    Time  12    Period  Weeks    Status  New      PT LONG TERM GOAL #4   Title  Patient to improve gait speed to 2 ft/sec with LRAD    Baseline  1.78f/sec on 11/13/2019 with SPC    Time  12    Period  Weeks    Status  New      PT LONG TERM GOAL #5   Title  Pt to ambulate limited community distances mod I at least 450 ft with LRAD and negotiate up/down one flight of stairs.    Time  12    Period  Weeks    Status  New      PT LONG TERM GOAL #6   Title   Pt will improve BERG to at least 45 to show improved balance    Baseline  42/56 on 11/13/2019    Time  12    Period  Weeks    Status  On-going  11/29/19 1104  Plan  Clinical Impression Statement Today's skilled session continued ot focus on gait with emphasis on increased weight shifting/stance time on left LE and on balance/NMR activities with emphasis on left LE weight bearing as well. No issues other than fatigue reported with rest breaks taken. The pt is progressing toward goals and should benefit from continued PT to progress toward unmet goals.  Personal Factors and Comorbidities Comorbidity 3+  Examination-Activity Limitations Locomotion Level;Transfers;Reach Overhead;Carry;Squat;Stairs;Lift;Stand  Examination-Participation Restrictions Meal Prep;Cleaning;Community Activity;Driving;Laundry;Shop  Pt will benefit from skilled therapeutic intervention in order to improve on the following deficits Abnormal gait;Decreased activity tolerance;Decreased balance;Decreased coordination;Decreased endurance;Decreased mobility;Decreased range of motion;Decreased strength;Difficulty walking;Impaired UE functional use;Postural dysfunction;Improper body mechanics  Stability/Clinical Decision Making Evolving/Moderate complexity  Rehab Potential Good  PT Frequency 2x / week  PT Duration 12 weeks  PT Treatment/Interventions Passive range of motion;Iontophoresis 31m/ml Dexamethasone;Ultrasound;Electrical Stimulation;Cryotherapy;Manual techniques;Therapeutic exercise;Therapeutic activities;Stair training;Gait training;Balance training;Neuromuscular re-education  PT Next Visit Plan Continue with left LE strengthening focusing on hip, weight shifting to left activities, dynamic gait and balance.  PT Home Exercise Plan Access Code: [email or web address]  Consulted and Agree with Plan of Care Patient         Patient will benefit from skilled therapeutic intervention in order to improve the following deficits  and impairments:  Abnormal gait, Decreased activity tolerance, Decreased balance, Decreased coordination, Decreased endurance, Decreased mobility, Decreased range of motion, Decreased strength, Difficulty walking, Impaired UE functional use, Postural dysfunction, Improper body mechanics  Visit Diagnosis: Muscle weakness (generalized)  Other abnormalities of gait and mobility  Unsteadiness on feet     Problem List Patient Active Problem List   Diagnosis Date Noted  . Subcortical infarction ([place] 09/05/2019  . Anticoagulated on warfarin   . [place] hemiparesis ([place]   . History of CVA with residual deficit   . Thrombocytopenia ([place]   . Prediabetes   . CVA (cerebral vascular accident) ([place] 09/03/2019  . Vertigo 09/24/2017  . Benign paroxysmal positional vertigo   . History of stroke   . Hyperlipemia 08/23/2017  . Encounter for therapeutic drug monitoring 08/08/2017  . Long term (current) use of anticoagulants [Z79.01] 07/12/2017  . Hemiparesis and alteration of sensations as late effects of stroke ([place] 09/07/2016  . Abnormality of gait 09/07/2016  . HTN (hypertension) 04/13/2016  . H/O mitral valve replacement with mechanical valve 10/16/2015  . Diabetes mellitus without complication ([place]   . Hemiparesis (L sided - mild) due to old stroke ([facility]     KWillow Ora PTA, [address] ChurchGR[address] [facility] [id number]/22/21, 7:33 PM   Name: Gabrielle TRIMMRN: [id number] of Birth: 7August 19, 1947

## 2019-12-03 ENCOUNTER — Ambulatory Visit: Payer: Medicare Other

## 2019-12-03 ENCOUNTER — Other Ambulatory Visit: Payer: Self-pay

## 2019-12-03 ENCOUNTER — Encounter: Payer: Self-pay | Admitting: Occupational Therapy

## 2019-12-03 ENCOUNTER — Ambulatory Visit (INDEPENDENT_AMBULATORY_CARE_PROVIDER_SITE_OTHER): Payer: Medicare Other | Admitting: Internal Medicine

## 2019-12-03 ENCOUNTER — Ambulatory Visit: Payer: Medicare Other | Admitting: Occupational Therapy

## 2019-12-03 DIAGNOSIS — R2689 Other abnormalities of gait and mobility: Secondary | ICD-10-CM

## 2019-12-03 DIAGNOSIS — Z5181 Encounter for therapeutic drug level monitoring: Secondary | ICD-10-CM | POA: Diagnosis not present

## 2019-12-03 DIAGNOSIS — I69354 Hemiplegia and hemiparesis following cerebral infarction affecting left non-dominant side: Secondary | ICD-10-CM

## 2019-12-03 DIAGNOSIS — R270 Ataxia, unspecified: Secondary | ICD-10-CM

## 2019-12-03 DIAGNOSIS — R2681 Unsteadiness on feet: Secondary | ICD-10-CM | POA: Diagnosis not present

## 2019-12-03 DIAGNOSIS — M6281 Muscle weakness (generalized): Secondary | ICD-10-CM

## 2019-12-03 DIAGNOSIS — R482 Apraxia: Secondary | ICD-10-CM

## 2019-12-03 DIAGNOSIS — R293 Abnormal posture: Secondary | ICD-10-CM

## 2019-12-03 DIAGNOSIS — M25552 Pain in left hip: Secondary | ICD-10-CM | POA: Diagnosis not present

## 2019-12-03 LAB — POCT INR: INR: 2.9 (ref 2.0–3.0)

## 2019-12-03 NOTE — Therapy (Signed)
[facility] [address] [place], [place], [address] Phone: [phone]   Fax:  [phone]  Physical Therapy Treatment/recert  Patient Details  Name: [name] MRN: [id number] Date of Birth: 04-Mar-1946 Referring Provider (PT): [name], MD   Encounter Date: 12/03/2019  PT End of Session - 12/03/19 0850    Visit Number  16    Number of Visits  33    Date for PT Re-Evaluation  02/09/20    Authorization Type  MCR    PT Start Time  0848    PT Stop Time  0926    PT Time Calculation (min)  38 min    Equipment Utilized During Treatment  --    Activity Tolerance  Patient tolerated treatment well;No increased pain    Behavior During Therapy  WFL for tasks assessed/performed       Past Medical History:  Diagnosis Date  . Abnormality of gait 09/07/2016  . Allergy   . Diabetes mellitus without complication [facility])    Patient denies this - notes history of glucose intolerance  . Hemiparesis and alteration of sensations as late effects of stroke ([place]) 09/07/2016  . History of pneumonia 1997  . S/P MVR (mitral valve replacement)    Mechanical mitral valve replacement at age 23 (done in [place])  // echo 7/17: EF 55-60, normal wall motion, bileaflet mechanical mitral valve prosthesis functioning normally, mild LAE, mildly reduced RVSF, small pericardial effusion  . Stroke [facility]) 1997, 2013, 2015    Past Surgical History:  Procedure Laterality Date  . ABDOMINAL HYSTERECTOMY    . BRAIN SURGERY    . BUNIONECTOMY  1993  . CARDIAC VALVE REPLACEMENT  1997  . TOE SURGERY  1996    There were no vitals filed for this visit.  Subjective Assessment - 12/03/19 0850    Subjective  Pt reports leg doing pretty good today. Did take medicine before she came.    Patient is accompained by:  Family member   spouse   How long can you sit comfortably?  1-2 min    Diagnostic tests  MRI showed old hemorrhagic infarct in the  right parietal cortical and subcortical brain which had progressed atrophy, encephalomalacia.  Suspicion of minor acute extension along the anterior margin of the old infarction affecting the deep white matter and small focus adjacent cortical brain.    Currently in Pain?  No/denies    Pain Score  0-No pain    Pain Location  Hip    Pain Orientation  Left    Pain Onset  More than a month ago                       [facility] Adult PT Treatment/Exercise - 12/03/19 0851      Transfers   Transfers  Sit to Stand;Stand to Sit    Sit to Stand  5: Supervision    Five time sit to stand comments   24 sec without UE support from mat    Stand to Sit  5: Supervision      Ambulation/Gait   Ambulation/Gait  Yes    Ambulation/Gait Assistance  5: Supervision    Ambulation Distance (Feet)  690 Feet    Assistive device  Straight cane   left AFO   Gait Pattern  Step-through pattern;Decreased hip/knee flexion - left;Decreased dorsiflexion - left;Decreased weight shift to left    Ambulation Surface  Level;Indoor    Gait velocity  2.23f/sec    Stairs  Yes    Stairs Assistance  5: Supervision    Stairs Assistance Details (indicate cue type and reason)  Pt used alternating pattern up and step-to down but was turning body more to side. Cues to try to stay straighter.    Stair Management Technique  One rail Right;Alternating pattern;Step to pattern    Number of Stairs  4    Gait Comments  Pt reported only a little soreness in left hip after gait.      Standardized Balance Assessment   Standardized Balance Assessment  Berg Balance Test;Timed Up and Go Test      Berg Balance Test   Sit to Stand  Able to stand without using hands and stabilize independently    Standing Unsupported  Able to stand safely 2 minutes    Sitting with Back Unsupported but Feet Supported on Floor or Stool  Able to sit safely and securely 2 minutes    Stand to Sit  Sits safely with minimal use of hands    Transfers  Able  to transfer safely, minor use of hands    Standing Unsupported with Eyes Closed  Able to stand 10 seconds safely    Standing Ubsupported with Feet Together  Able to place feet together independently and stand 1 minute safely    From Standing, Reach Forward with Outstretched Arm  Can reach confidently >25 cm (10")    From Standing Position, Pick up Object from Floor  Able to pick up shoe, needs supervision    From Standing Position, Turn to Look Behind Over each Shoulder  Looks behind one side only/other side shows less weight shift    Turn 360 Degrees  Able to turn 360 degrees safely but slowly    Standing Unsupported, Alternately Place Feet on Step/Stool  Able to complete 4 steps without aid or supervision    Standing Unsupported, One Foot in Front  Able to plae foot ahead of the other independently and hold 30 seconds    Standing on One Leg  Tries to lift leg/unable to hold 3 seconds but remains standing independently    Total Score  46      Timed Up and Go Test   TUG  Normal TUG    Normal TUG (seconds)  18.6   18.9 sec and 18.4 sec without AD            PT Education - 12/03/19 1143    Education Details  Pt to continue with current HEP. Discussed plan to recert    Person(s) Educated  Patient    Methods  Explanation    Comprehension  Verbalized understanding       PT Short Term Goals - 12/03/19 00488     PT SHORT TERM GOAL #1   Title  Patient to be independent with initial HEP.    Baseline  10/17/19: met per pt and spouse report    Status  Achieved      PT SHORT TERM GOAL #2   Title  Pt will improve TUG to less than 20 sec    Baseline  12/03/19 TUG=18.6 sec    Status  Achieved    Target Date  11/19/19      PT SHORT TERM GOAL #3   Title  Pt will improve BERG to more than 40    Baseline  10/17/19: 44/56 scored today    Status  Achieved      PT SHORT TERM GOAL #  4   Title  Pt will be instructed in ways to better manage left hip pain for improved ability to participate  with activities.    Baseline  11/22/19: met in session today- uses ice, stretching, walking and tylenol.    Status  Achieved    Target Date  11/19/19        PT Long Term Goals - 12/03/19 0902      PT LONG TERM GOAL #1   Title  Patient to be independent with advanced HEP. (Target for LTG 12 weeks 12/12/19)    Baseline  PT continues to progress HEP    Time  12    Period  Weeks    Status  On-going      PT LONG TERM GOAL #2   Title  Pt will improve TUG to less than 17 seconds    Baseline  TUG without AD=18.9 and 18.4 sec with 18.6 sec average    Time  12    Period  Weeks    Status  Not Met      PT LONG TERM GOAL #3   Title  Patient to score <20 sec on 5xSTS without UE support to decrease risk of falls.    Baseline  24 sec on 5x sit to stand    Time  12    Period  Weeks    Status  Not Met      PT LONG TERM GOAL #4   Title  Patient to improve gait speed to 2 ft/sec with LRAD    Baseline  1.41f/sec on 11/13/2019 with SPC, 12/03/19 gait speed=2.020f sec    Time  12    Period  Weeks    Status  Achieved      PT LONG TERM GOAL #5   Title  Pt to ambulate limited community distances mod I at least 450 ft with LRAD and negotiate up/down one flight of stairs.    Baseline  Pt ambulated 690' with SPC supervision, up/down 4 steps supervision    Time  12    Period  Weeks    Status  Not Met      PT LONG TERM GOAL #6   Title  Pt will improve BERG to at least 45 to show improved balance    Baseline  42/56 on 11/13/2019, 12/03/19 Berg=46/56    Time  12    Period  Weeks    Status  Achieved       Updated PT goals: PT Short Term Goals - 12/03/19 1153      PT SHORT TERM GOAL #1   Title  Pt will ambulate up/down flight of steps with 1 rail mod I for improved mobility and community access.    Time  4    Period  Weeks    Status  New    Target Date  01/02/20      PT SHORT TERM GOAL #2   Title  Pt will decrease TUG from 18. 6 sec to <16 sec for improved balance and functional mobility.     Baseline  12/03/19 TUG=18.6 sec    Time  4    Period  Weeks    Status  New    Target Date  01/02/20      PT Long Term Goals - 12/03/19 1155      PT LONG TERM GOAL #1   Title  Patient to be independent with advanced HEP.     Baseline  PT continues to progress  HEP    Time  8    Period  Weeks    Status  On-going    Target Date  02/09/20      PT LONG TERM GOAL #2   Title  Pt to ambulate >500' varied surfaces with SPC mod I for improved community mobility.    Time  8    Period  Weeks    Status  Revised    Target Date  02/09/20      PT LONG TERM GOAL #3   Title  Patient to score <20 sec on 5xSTS without UE support to decrease risk of falls and improved functional strength.    Baseline  24 sec on 5x sit to stand    Time  8    Period  Weeks    Status  On-going    Target Date  02/09/20      PT LONG TERM GOAL #4   Title  Patient to improve gait speed to 2.62 ft/sec with [facility] for improved community mobility.    Baseline  12/03/19 gait speed=2.51f/ sec    Time  8    Period  Weeks    Status  Revised    Target Date  02/09/20           Plan - 12/03/19 1147    Clinical Impression Statement  PT reassessed goals at visit today. Pt was able to show progress on all measures. Pt met gait speed goal showing improving gait safety. Pt met BMerrilee Janskygoal with 46/56 score showing decreasing fall risk and also decreased time on TUG and 5 x sit to stand. Pt's left hip pain has been doing better limiting her less. Pt continues to benefit from skilled PT to address stength, balance and functional mobility deficits to further improve her gait safety and mobility in community.    Personal Factors and Comorbidities  Comorbidity 3+    Examination-Activity Limitations  Locomotion Level;Transfers;Reach Overhead;Carry;Squat;Stairs;Lift;Stand    Examination-Participation Restrictions  Meal Prep;Cleaning;Community Activity;Driving;Laundry;Shop    Stability/Clinical Decision Making  Evolving/Moderate complexity     Rehab Potential  Good    PT Frequency  2x / week    PT Duration  8 weeks    PT Treatment/Interventions  Passive range of motion;Iontophoresis 430mml Dexamethasone;Ultrasound;Electrical Stimulation;Cryotherapy;Manual techniques;Therapeutic exercise;Therapeutic activities;Stair training;Gait training;Balance training;Neuromuscular re-education;Moist Heat;Patient/family education    PT Next Visit Plan  Continue with left LE strengthening focusing on hip, weight shifting to left activities, dynamic gait and balance. PT did recert early    PT Home Exercise Plan  Access Code: [email or web address]    Consulted and Agree with Plan of Care  Patient       Patient will benefit from skilled therapeutic intervention in order to improve the following deficits and impairments:  Abnormal gait, Decreased activity tolerance, Decreased balance, Decreased coordination, Decreased endurance, Decreased mobility, Decreased range of motion, Decreased strength, Difficulty walking, Impaired UE functional use, Postural dysfunction, Improper body mechanics  Visit Diagnosis: Muscle weakness (generalized)  Other abnormalities of gait and mobility     Problem List Patient Active Problem List   Diagnosis Date Noted  . Subcortical infarction (HCBruce[place]10/28/2020  . Anticoagulated on warfarin   . Left hemiparesis (HCWe[place]  . History of CVA with residual deficit   . Thrombocytopenia ([place]CA[place]  . Prediabetes   . CVA (cerebral vascular accident) (HCCon[place]10/26/2020  . Vertigo 09/24/2017  . Benign paroxysmal positional vertigo   . History of stroke   . Hyperlipemia 08/23/2017  . Encounter  for therapeutic drug monitoring 08/08/2017  . Long term (current) use of anticoagulants [Z79.01] 07/12/2017  . Hemiparesis and alteration of sensations as late effects of stroke ([place]) 09/07/2016  . Abnormality of gait 09/07/2016  . HTN (hypertension) 04/13/2016  . H/O mitral valve replacement with mechanical valve 10/16/2015  . Diabetes mellitus  without complication ([place])   . Hemiparesis (L sided - mild) due to old stroke ([place])     Electa Sniff, PT, DPT, NCS 12/03/2019, 11:52 AM  [facility] [address] [place], [place], [address] Phone: [phone]   Fax:  [phone]  Name: DAISY LITES MRN: [id number] Date of Birth: 18-Jan-1946

## 2019-12-03 NOTE — Therapy (Signed)
[facility] [address] [place], [place], [address] Phone: [phone]   Fax:  [phone]  Occupational Therapy Treatment  Patient Details  Name: [name] MRN: [id number] Date of Birth: Aug 20, 1946 Referring Provider (OT): Dr. Letta Pate   Encounter Date: 12/03/2019  OT End of Session - 12/03/19 1209    Visit Number  10    Number of Visits  16    Date for OT Re-Evaluation  12/07/19    Authorization Type  medicare will need PN every 10th visit    Authorization Time Period  90 days    Authorization - Visit Number  10    Authorization - Number of Visits  20    OT Start Time  0933    OT Stop Time  1015    OT Time Calculation (min)  42 min    Activity Tolerance  Patient tolerated treatment well       Past Medical History:  Diagnosis Date  . Abnormality of gait 09/07/2016  . Allergy   . Diabetes mellitus without complication [facility])    Patient denies this - notes history of glucose intolerance  . Hemiparesis and alteration of sensations as late effects of stroke ([place]) 09/07/2016  . History of pneumonia 1997  . S/P MVR (mitral valve replacement)    Mechanical mitral valve replacement at age 33 (done in [place])  // echo 7/17: EF 55-60, normal wall motion, bileaflet mechanical mitral valve prosthesis functioning normally, mild LAE, mildly reduced RVSF, small pericardial effusion  . Stroke [facility]) 1997, 2013, 2015    Past Surgical History:  Procedure Laterality Date  . ABDOMINAL HYSTERECTOMY    . BRAIN SURGERY    . BUNIONECTOMY  1993  . CARDIAC VALVE REPLACEMENT  1997  . TOE SURGERY  1996    There were no vitals filed for this visit.  Subjective Assessment - 12/03/19 0932    Subjective   My arm really has gotten so much better    Patient is accompanied by:  Family member   husband   Pertinent History  09/02/2019 R CVA ( suspect extension of old infarct affecting deep white matter and small focus adjacent to  cortical brain.  PMH:  prior CVA's first in April of '97, August of 97, fall of 2014, spring of 2015.    Patient Stated Goals  I hope my hand will get better "I want it down and I know I am suppose to swing it more naturally when I walk.  Sometimes I walk into the door frame on my left.    Currently in Pain?  No/denies                   OT Treatments/Exercises (OP) - 12/03/19 1201      Neurological Re-education Exercises   Other Exercises 1  Neuro re ed to address revision of HEP  to include daily stretching as well as function reaching activities and functional use of LUE - see pt instruction section for details. After instruction, practice and repetition pt able to return demonstrate all activities.  Pt has not been consistently doing HEP for LUE due to difficulty.  Current program much easier for pt and functional task provides automatic feedback on performance. After pt compteted stretching program in supine, pt able to demonstrate overhead reach of 120* consistently in standing during functional task.  Pt also reports she is doing bilateral tasks at home such as washing dishes, folding laundry etc.  OT Education - 12/03/19 1206    Education Details  upgraded home activities program for LUE    Methods  Explanation;Demonstration;Handout;Verbal cues    Comprehension  Verbalized understanding;Returned demonstration       OT Short Term Goals - 12/03/19 1207      OT SHORT TERM GOAL #1   Title  Pt and husband will be mod I with HEP to address L grip strength and L coordination - 10/31/19 (date adjused as pt has just now returned for first treatment session)    Status  Achieved      OT SHORT TERM GOAL #2   Title  Pt will be mod I with cutting food on plate AE prn    Status  Achieved   pt and husband to order rocker knife from [place] #3   Title  Pt will demonstrate understanding of use of button hook for smaller buttons    Status   Achieved   pt practiced in clinic - pt needed cues d/t apraxia but improved w/ repetition     OT SHORT TERM GOAL #4   Title  Pt will be mod I with bathing back AE prn    Status  Achieved      OT SHORT TERM GOAL #5   Title  Pt will be supervision for toilet tranfers    Status  Achieved   mod I level (per pt/family report)     OT SHORT TERM GOAL #6   Title  Pt will demonstrate improved grip strength by at least 4 pounds to assist with light housekeeping tasks (baseline = 22)    Status  Achieved   LUE = 29       OT Long Term Goals - 12/03/19 1207      OT LONG TERM GOAL #1   Title  Pt and husband will be mod I with HEP to address LUE strength/funtional use and improved activity tolerance. - 11/28/2019 (date adjusted as pt just now returning for first treatment session)    Status  On-going      OT LONG TERM GOAL #2   Title  Pt will be mod I with toilet transfers    Status  Achieved      OT LONG TERM GOAL #3   Title  Pt will be mod I with simple snack prep at ambulatory level    Status  Achieved      OT LONG TERM GOAL #4   Title  Pt will be mod I with laundry    Status  Achieved      OT LONG TERM GOAL #5   Title  Pt will require no more than supervsion for short shopping trips (i.e. [employer], [employer])    Status  Achieved      OT LONG TERM GOAL #6   Title  Pt will demonstrate improved grip strength by at least 6 pounds to assist with cold snack prep and light housekeeping (baseline = 22)    Status  Achieved   LUE = 29 pounds     OT LONG TERM GOAL #7   Title  Pt will demonstrate overhead reach with LUE to at least 110* of shoulder flexion with LUE to obtain light weight object in overhead cabinet    Status  Achieved   12/03/2019  120* of overhead functional reach     OT LONG TERM GOAL #8   Title  Pt and husband will  verbalize understanding of driving recommendations    Status  Achieved            Plan - 12/03/19 1208    Clinical Impression Statement  Pt  has made good progress toward goals. WIll finalize home activities program next session and reinforce postural alignment and use of vision to improve functional use of L hand. Anticipate d/c at end of next sesion.    OT Occupational Profile and History  Detailed Assessment- Review of Records and additional review of physical, cognitive, psychosocial history related to current functional performance    Occupational performance deficits (Please refer to evaluation for details):  ADL's;IADL's;Leisure;Social Participation    Body Structure / Function / Physical Skills  ADL;Balance;Coordination;Decreased knowledge of use of DME;Dexterity;IADL;GMC;FMC;Endurance;Mobility;Proprioception;ROM;Sensation;Strength;Tone;UE functional use    Rehab Potential  Good    Clinical Decision Making  Several treatment options, min-mod task modification necessary    Comorbidities Affecting Occupational Performance:  Presence of comorbidities impacting occupational performance    Comorbidities impacting occupational performance description:  see above PMH    Modification or Assistance to Complete Evaluation   Min-Moderate modification of tasks or assist with assess necessary to complete eval    OT Frequency  2x / week    OT Duration  8 weeks    OT Treatment/Interventions  Self-care/ADL training;Aquatic Therapy;Moist Heat;Therapeutic exercise;Neuromuscular education;DME and/or AE instruction;Passive range of motion;Manual Therapy;Therapist, nutritional;Therapeutic activities;Visual/perceptual remediation/compensation;Patient/family education;Balance training    Plan  finalize home activities program, d/c after next session    Consulted and Agree with Plan of Care  Patient;Family member/caregiver       Patient will benefit from skilled therapeutic intervention in order to improve the following deficits and impairments:   Body Structure / Function / Physical Skills: ADL, Balance, Coordination, Decreased knowledge of use  of DME, Dexterity, IADL, GMC, FMC, Endurance, Mobility, Proprioception, ROM, Sensation, Strength, Tone, UE functional use       Visit Diagnosis: Muscle weakness (generalized)  Hemiplegia and hemiparesis following cerebral infarction affecting left non-dominant side (HCC)  Apraxia  Ataxia  Unsteadiness on feet  Abnormal posture    Problem List Patient Active Problem List   Diagnosis Date Noted  . 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 Anticoagulated on warfarin   . Left hemiparesis ([place])   . History of CVA with residual deficit   . Thrombocytopenia ([place])   . Prediabetes   . CVA (cerebral vascular accident) ([place]) 09/03/2019  . Vertigo 09/24/2017  . Benign paroxysmal positional vertigo   . History of stroke   . Hyperlipemia 08/23/2017  . Encounter for therapeutic drug monitoring 08/08/2017  . Long term (current) use of anticoagulants [Z79.01] 07/12/2017  . Hemiparesis and alteration of sensations as late effects of stroke ([place]) 09/07/2016  . Abnormality of gait 09/07/2016  . HTN (hypertension) 04/13/2016  . H/O mitral valve replacement with mechanical valve 10/16/2015  . Diabetes mellitus without complication ([place])   . Hemiparesis (L sided - mild) due to old stroke [facility])    Occupational Therapy Progress Note  Dates of Reporting Period: 09/18/2020 to 12/03/2019  Objective Reports of Subjective Statement: see above  Objective Measurements: see above  Goal Update: see above  Plan: see above  Reason Skilled Services are Required: See above   Quay Burow , OTR/L 12/03/2019, 12:11 PM  [facility] [address] [place], [place], [address] Phone: [phone]   Fax:  [phone]  Name: [name] MRN: [id number] Date of Birth: 09-Mar-1946

## 2019-12-03 NOTE — Patient Instructions (Signed)
Description    Take 10mg  today, then continue same dosage 5mg  daily except 7.5mg  on Mondays and Fridays. Pt will continue greens with 2 cups of lettuce on Fridays. Does weekly checks due to insurance.

## 2019-12-03 NOTE — Patient Instructions (Signed)
Here is what you can do as your home activities program for your left arm:  1.  Every single day  - just like brushing your teeth - you will need to stretch your arm out.  Lay on your back and clasp your hands so that your palms are touching each other. KEEPING YOUR ELBOW STRAIGHT, raise your arms over your head as far as you can and HOLD FOR A SLOW COUNT OF 5.  Do 10, rest then do 10 more. Do in the morning before you get out of bed and then again at night when you go to bed (this means you will do 40 total a day).  2. Practice putting cups away in the cabinet. If it is plastic and light weight use ONLY your left hand. If it is heavier or breakable use both hands. Make sure you have your feet lined up and weight on both legs (not all on your strong leg).  THINK LONG ARMS and REACHING WITH YOUR HAND!  You will do better if you are looking at your left hand. Do once a day several times like we did in the clinic.  3. USE IT, USE IT, USE IT as much and as often as you can!!  Your hand and arm will always work better if you look at it!>

## 2019-12-05 ENCOUNTER — Encounter: Payer: Self-pay | Admitting: Physical Therapy

## 2019-12-05 ENCOUNTER — Other Ambulatory Visit: Payer: Self-pay

## 2019-12-05 ENCOUNTER — Ambulatory Visit: Payer: Medicare Other | Admitting: Physical Therapy

## 2019-12-05 ENCOUNTER — Encounter: Payer: Self-pay | Admitting: Occupational Therapy

## 2019-12-05 ENCOUNTER — Ambulatory Visit: Payer: Medicare Other | Admitting: Occupational Therapy

## 2019-12-05 DIAGNOSIS — M25552 Pain in left hip: Secondary | ICD-10-CM | POA: Diagnosis not present

## 2019-12-05 DIAGNOSIS — R293 Abnormal posture: Secondary | ICD-10-CM

## 2019-12-05 DIAGNOSIS — I69354 Hemiplegia and hemiparesis following cerebral infarction affecting left non-dominant side: Secondary | ICD-10-CM | POA: Diagnosis not present

## 2019-12-05 DIAGNOSIS — M6281 Muscle weakness (generalized): Secondary | ICD-10-CM | POA: Diagnosis not present

## 2019-12-05 DIAGNOSIS — R2689 Other abnormalities of gait and mobility: Secondary | ICD-10-CM

## 2019-12-05 DIAGNOSIS — R2681 Unsteadiness on feet: Secondary | ICD-10-CM

## 2019-12-05 DIAGNOSIS — R270 Ataxia, unspecified: Secondary | ICD-10-CM

## 2019-12-05 DIAGNOSIS — R482 Apraxia: Secondary | ICD-10-CM

## 2019-12-05 NOTE — Therapy (Signed)
[facility] [address] [place], [place], [address] Phone: [phone]   Fax:  [phone]  Occupational Therapy Treatment  Patient Details  Name: [name] MRN: [id number] Date of Birth: [birth year] Referring Provider (OT): Dr. Letta Pate   Encounter Date: 12/05/2019  OT End of Session - 12/05/19 1203    Visit Number  11    Number of Visits  16    Date for OT Re-Evaluation  12/07/19    Authorization Type  medicare will need PN every 10th visit    Authorization - Visit Number  11    Authorization - Number of Visits  20    OT Start Time  0933    OT Stop Time  1014    OT Time Calculation (min)  41 min    Activity Tolerance  Patient tolerated treatment well       Past Medical History:  Diagnosis Date  . Abnormality of gait 09/07/2016  . Allergy   . Diabetes mellitus without complication [facility])    Patient denies this - notes history of glucose intolerance  . Hemiparesis and alteration of sensations as late effects of stroke ([place]) 09/07/2016  . History of pneumonia 1997  . S/P MVR (mitral valve replacement)    Mechanical mitral valve replacement at age 70 (done in [place])  // echo 7/17: EF 55-60, normal wall motion, bileaflet mechanical mitral valve prosthesis functioning normally, mild LAE, mildly reduced RVSF, small pericardial effusion  . Stroke [facility]) 1997, 2013, 2015    Past Surgical History:  Procedure Laterality Date  . ABDOMINAL HYSTERECTOMY    . BRAIN SURGERY    . BUNIONECTOMY  1993  . CARDIAC VALVE REPLACEMENT  1997  . TOE SURGERY  1996    There were no vitals filed for this visit.  Subjective Assessment - 12/05/19 0936    Subjective   Overall I think I am doing pretty good    Pertinent History  09/02/2019 R CVA ( suspect extension of old infarct affecting deep white matter and small focus adjacent to cortical brain.  PMH:  prior CVA's first in April of '97, August of 97, fall of 2014, spring of  2015.    Patient Stated Goals  I hope my hand will get better "I want it down and I know I am suppose to swing it more naturally when I walk.  Sometimes I walk into the door frame on my left.    Currently in Pain?  Yes    Pain Score  1     Pain Location  Hip    Pain Orientation  Left    Pain Descriptors / Indicators  Aching    Pain Type  Chronic pain    Pain Onset  More than a month ago    Pain Frequency  Intermittent    Aggravating Factors   standing too long, walking too long    Pain Relieving Factors  bengay, tylenol, ice massage                   OT Treatments/Exercises (OP) - 12/05/19 1157      Neurological Re-education Exercises   Other Exercises 1  Upgraded pt's HEP for LUE to include one pound wrist cuff - pt able to return demonstrate all reps using weight. Provided print out for pt and husband on where to obtain wrist cuff type weight. Also upgraded putty to green and educated pt on slow incoporation of green putty into HEP -  pt's grip strength has improved from 22 pounds on eval to 40 pounds today.  Reviewed what pt is doing for coordination and emphasized completing the tasks that are a bit harder as well as the easier ones. Pt verbalized understanding. Pt reports she and her husband created "work space" in kitchen at home for pt to practice overhead reach in cabiniets with cups.  Pt has met all goals and is ready for discharge - pt and husband in agreement.              OT Education - 12/05/19 1201    Education Details  upgraded HEP - see PN and pt instruction section for details.    Person(s) Educated  Patient;Spouse    Methods  Explanation;Demonstration;Handout;Verbal cues    Comprehension  Verbalized understanding;Returned demonstration       OT Short Term Goals - 12/03/19 1207      OT SHORT TERM GOAL #1   Title  Pt and husband will be mod I with HEP to address L grip strength and L coordination - 10/31/19 (date adjused as pt has just now returned  for first treatment session)    Status  Achieved      OT SHORT TERM GOAL #2   Title  Pt will be mod I with cutting food on plate AE prn    Status  Achieved   pt and husband to order rocker knife from [place] #3   Title  Pt will demonstrate understanding of use of button hook for smaller buttons    Status  Achieved   pt practiced in clinic - pt needed cues d/t apraxia but improved w/ repetition     OT SHORT TERM GOAL #4   Title  Pt will be mod I with bathing back AE prn    Status  Achieved      OT SHORT TERM GOAL #5   Title  Pt will be supervision for toilet tranfers    Status  Achieved   mod I level (per pt/family report)     OT SHORT TERM GOAL #6   Title  Pt will demonstrate improved grip strength by at least 4 pounds to assist with light housekeeping tasks (baseline = 22)    Status  Achieved   LUE = 29       OT Long Term Goals - 12/05/19 1202      OT LONG TERM GOAL #1   Title  Pt and husband will be mod I with HEP to address LUE strength/funtional use and improved activity tolerance. - 11/28/2019 (date adjusted as pt just now returning for first treatment session)    Status  Achieved      OT LONG TERM GOAL #2   Title  Pt will be mod I with toilet transfers    Status  Achieved      OT LONG TERM GOAL #3   Title  Pt will be mod I with simple snack prep at ambulatory level    Status  Achieved      OT LONG TERM GOAL #4   Title  Pt will be mod I with laundry    Status  Achieved      OT LONG TERM GOAL #5   Title  Pt will require no more than supervsion for short shopping trips (i.e. [employer], [employer])    Status  Achieved      OT LONG TERM GOAL #6  Title  Pt will demonstrate improved grip strength by at least 6 pounds to assist with cold snack prep and light housekeeping (baseline = 22)    Status  Achieved   LUE = 40 pounds 12/05/2019     OT LONG TERM GOAL #7   Title  Pt will demonstrate overhead reach with LUE to at least 110* of  shoulder flexion with LUE to obtain light weight object in overhead cabinet    Status  Achieved   12/03/2019  120* of overhead functional reach     [place] #8   Title  Pt and husband will verbalize understanding of driving recommendations    Status  Achieved            Plan - 12/05/19 1202    Clinical Impression Statement  Pt has met all OT goals and is ready for discharge. PT andhusband in agreement    OT Occupational Profile and History  Detailed Assessment- Review of Records and additional review of physical, cognitive, psychosocial history related to current functional performance    Body Structure / Function / Physical Skills  ADL;Balance;Coordination;Decreased knowledge of use of DME;Dexterity;IADL;GMC;FMC;Endurance;Mobility;Proprioception;ROM;Sensation;Strength;Tone;UE functional use    Rehab Potential  Good    Clinical Decision Making  Several treatment options, min-mod task modification necessary    Comorbidities Affecting Occupational Performance:  Presence of comorbidities impacting occupational performance    Comorbidities impacting occupational performance description:  see above PMH    Modification or Assistance to Complete Evaluation   Min-Moderate modification of tasks or assist with assess necessary to complete eval    OT Frequency  2x / week    OT Duration  8 weeks    OT Treatment/Interventions  Self-care/ADL training;Aquatic Therapy;Moist Heat;Therapeutic exercise;Neuromuscular education;DME and/or AE instruction;Passive range of motion;Manual Therapy;Therapist, nutritional;Therapeutic activities;Visual/perceptual remediation/compensation;Patient/family education;Balance training    Plan  d/c from OT    Consulted and Agree with Plan of Care  Patient;Family member/caregiver    Family Member Consulted  husband       Patient will benefit from skilled therapeutic intervention in order to improve the following deficits and impairments:   Body Structure /  Function / Physical Skills: ADL, Balance, Coordination, Decreased knowledge of use of DME, Dexterity, IADL, GMC, FMC, Endurance, Mobility, Proprioception, ROM, Sensation, Strength, Tone, UE functional use       Visit Diagnosis: Muscle weakness (generalized)  Hemiplegia and hemiparesis following cerebral infarction affecting left non-dominant side (HCC)  Apraxia  Ataxia  Unsteadiness on feet  Abnormal posture    Problem List Patient Active Problem List   Diagnosis Date Noted  . 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 Anticoagulated on warfarin   . Left hemiparesis ([place])   . History of CVA with residual deficit   . Thrombocytopenia ([place])   . Prediabetes   . CVA (cerebral vascular accident) ([employer]) 09/03/2019  . Vertigo 09/24/2017  . Benign paroxysmal positional vertigo   . History of stroke   . Hyperlipemia 08/23/2017  . Encounter for therapeutic drug monitoring 08/08/2017  . Long term (current) use of anticoagulants [Z79.01] 07/12/2017  . Hemiparesis and alteration of sensations as late effects of stroke ([place]) 09/07/2016  . Abnormality of gait 09/07/2016  . HTN (hypertension) 04/13/2016  . H/O mitral valve replacement with mechanical valve 10/16/2015  . Diabetes mellitus without complication ([place])   . Hemiparesis (L sided - mild) due to old stroke ([place])    OCCUPATIONAL THERAPY DISCHARGE SUMMARY  Visits from Start of Care: 11  Current  functional level related to goals / functional outcomes: See above   Remaining deficits: L hemiplegia,    Education / Equipment: Home activities and home exercise program Plan: Patient agrees to discharge.  Patient goals were met. Patient is being discharged due to meeting the stated rehab goals.  ?????      Quay Burow , OTR/L 12/05/2019, 12:04 PM  [facility] [address] [place], [place], [address] Phone: [phone]   Fax:  [phone]  Name:  [name] MRN: [id number] Date of Birth: December 19, 1945

## 2019-12-05 NOTE — Patient Instructions (Addendum)
Here are some upgrades to your home program for your left arm:  1. Add a one pound wrist weight to your left arm when you do your two exercises laying on your back.  2. SLOWLY switch to the green putty.  Here's how:  - start with doing 5 green, then 5 red.  Rest do 10 more with red  -advance as you feel ready to 10 green,.  Rest and do more 10 more with the red  - advance as you feel ready to 10 green. Rest do 5 more with green and then 5 more with red. When you first start with the green your hand may be too tired to do it 2 times a day and that's ok.  For the second set (evening) just do what you feel you can with the goal to be able to do it twice a day.

## 2019-12-06 ENCOUNTER — Ambulatory Visit: Payer: Medicare Other

## 2019-12-06 NOTE — Therapy (Signed)
[facility] [address] [place], [place], [address] Phone: [phone]   Fax:  [phone]  Physical Therapy Treatment  Patient Details  Name: [name] MRN: [id number] Date of Birth: 02-Feb-1946 Referring Provider (PT): Charlett Blake, MD   Encounter Date: 12/05/2019     12/05/19 1019  PT Visits / Re-Eval  Visit Number 17  Number of Visits 33  Date for PT Re-Evaluation 02/09/20  Authorization  Authorization Type MCR  PT Time Calculation  PT Start Time 1017  PT Stop Time 1100  PT Time Calculation (min) 43 min  PT - End of Session  Equipment Utilized During Treatment Gait belt  Activity Tolerance Patient tolerated treatment well;No increased pain  Behavior During Therapy WFL for tasks assessed/performed    Past Medical History:  Diagnosis Date  . Abnormality of gait 09/07/2016  . Allergy   . Diabetes mellitus without complication [facility])    Patient denies this - notes history of glucose intolerance  . Hemiparesis and alteration of sensations as late effects of stroke ([place]) 09/07/2016  . History of pneumonia 1997  . S/P MVR (mitral valve replacement)    Mechanical mitral valve replacement at age 48 (done in [place])  // echo 7/17: EF 55-60, normal wall motion, bileaflet mechanical mitral valve prosthesis functioning normally, mild LAE, mildly reduced RVSF, small pericardial effusion  . Stroke [facility]) 1997, 2013, 2015    Past Surgical History:  Procedure Laterality Date  . ABDOMINAL HYSTERECTOMY    . BRAIN SURGERY    . BUNIONECTOMY  1993  . CARDIAC VALVE REPLACEMENT  1997  . TOE SURGERY  1996    There were no vitals filed for this visit.     12/05/19 1017  Symptoms/Limitations  Subjective No new complatins. No falls.  Patient is accompained by: Family member (spouse)  How long can you sit comfortably? 1-2 min  Diagnostic tests MRI showed old hemorrhagic infarct in the right parietal cortical  and subcortical brain which had progressed atrophy, encephalomalacia.  Suspicion of minor acute extension along the anterior margin of the old infarction affecting the deep white matter and small focus adjacent cortical brain.  Pain Assessment  Currently in Pain? Yes  Pain Score 1  Pain Location Hip  Pain Orientation Left  Pain Descriptors / Indicators Aching  Pain Type Chronic pain  Pain Onset More than a month ago  Pain Frequency Intermittent  Aggravating Factors  standing too long, walking too long  Pain Relieving Factors bengay, tyenol, ice massage      12/05/19 1020  Transfers  Transfers Sit to Stand;Stand to Sit  Sit to Stand 5: Supervision;With upper extremity assist;From bed;From chair/3-in-1  Stand to Sit 5: Supervision;With upper extremity assist;To bed;To chair/3-in-1  Ambulation/Gait  Ambulation/Gait Yes  Ambulation/Gait Assistance 5: Supervision;4: Min guard;4: Min assist  Ambulation/Gait Assistance Details cues for step length and increased stance time on left LE. Faciliation for weight shifting with gait onto left LE with increased stance time on left side.   Ambulation Distance (Feet) 230 Feet (x1, plus around gym with session)  Assistive device Straight cane;Other (Comment) (left AFO)  Gait Pattern Step-through pattern;Decreased hip/knee flexion - left;Decreased dorsiflexion - left;Decreased weight shift to left  Ambulation Surface Level;Indoor  Gait Comments had pt work on scanning enviroment in all directions and on speed changes with min guard assist on second lap around track.  12/05/19 1058  Balance Exercises: Standing  Rockerboard Anterior/posterior;Lateral;Head turns;EO;EC;30 seconds;10 reps;Limitations  Rockerboard Limitations performed both ways on balance board: alternating UE raises, progressing to bil UE raises for 5 reps each with min guard assist, then bil UE raises for 5 reps with min guard assist for balance. then had pt hold  board steady with EC no head movements, to EC head movements left<>right, up<>down for 10 reps each with up to min assist for balance needed, light UE touch to bars for balance with single UE.          PT Short Term Goals - 12/03/19 1153      PT SHORT TERM GOAL #1   Title  Pt will ambulate up/down flight of steps with 1 rail mod I for improved mobility and community access.    Time  4    Period  Weeks    Status  New    Target Date  01/02/20      PT SHORT TERM GOAL #2   Title  Pt will decrease TUG from 18. 6 sec to <16 sec for improved balance and functional mobility.    Baseline  12/03/19 TUG=18.6 sec    Time  4    Period  Weeks    Status  New    Target Date  01/02/20        PT Long Term Goals - 12/03/19 1155      PT LONG TERM GOAL #1   Title  Patient to be independent with advanced HEP.    Baseline  PT continues to progress HEP    Time  8    Period  Weeks    Status  On-going    Target Date  02/09/20      PT LONG TERM GOAL #2   Title  Pt to ambulate >500' varied surfaces with SPC mod I for improved community mobility.    Time  8    Period  Weeks    Status  Revised    Target Date  02/09/20      PT LONG TERM GOAL #3   Title  Patient to score <20 sec on 5xSTS without UE support to decrease risk of falls and improved functional strength.    Baseline  24 sec on 5x sit to stand    Time  8    Period  Weeks    Status  On-going    Target Date  02/09/20      PT LONG TERM GOAL #4   Title  Patient to improve gait speed to 2.62 ft/sec with [facility] for improved community mobility.    Baseline  12/03/19 gait speed=2.25ft/ sec    Time  8    Period  Weeks    Status  Revised    Target Date  02/09/20          12/05/19 1019  Plan  Clinical Impression Statement Today's skilled session continued to focus on gait mechanics, dynamic gait and balance reactions.The pt is making steady progress and should benefit from continued PT to progress toward unmet goals.  Personal Factors  and Comorbidities Comorbidity 3+  Examination-Activity Limitations Locomotion Level;Transfers;Reach Overhead;Carry;Squat;Stairs;Lift;Stand  Examination-Participation Restrictions Meal Prep;Cleaning;Community Activity;Driving;Laundry;Shop  Pt will benefit from skilled therapeutic intervention in order to improve on the following deficits Abnormal gait;Decreased activity tolerance;Decreased balance;Decreased coordination;Decreased endurance;Decreased mobility;Decreased range of motion;Decreased strength;Difficulty walking;Impaired UE functional use;Postural dysfunction;Improper body mechanics  Stability/Clinical Decision Making Evolving/Moderate complexity  Rehab Potential Good  PT Frequency 2x / week  PT Duration 8 weeks  PT Treatment/Interventions Passive range of motion;Iontophoresis 4mg /ml Dexamethasone;Ultrasound;Electrical Stimulation;Cryotherapy;Manual techniques;Therapeutic exercise;Therapeutic activities;Stair training;Gait training;Balance training;Neuromuscular re-education;Moist Heat;Patient/family education  PT Next Visit Plan Continue with left LE strengthening focusing on hip, weight shifting to left activities, dynamic gait and balance. PT did recert early  PT Home Exercise Plan Access Code: [email or web address]  Consulted and Agree with Plan of Care Patient         Patient will benefit from skilled therapeutic intervention in order to improve the following deficits and impairments:  Abnormal gait, Decreased activity tolerance, Decreased balance, Decreased coordination, Decreased endurance, Decreased mobility, Decreased range of motion, Decreased strength, Difficulty walking, Impaired UE functional use, Postural dysfunction, Improper body mechanics  Visit Diagnosis: Muscle weakness (generalized)  Hemiplegia and hemiparesis following cerebral infarction affecting left non-dominant side (HCC)  Unsteadiness on feet  Other abnormalities of gait and mobility     Problem List Patient  Active Problem List   Diagnosis Date Noted  . 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 Anticoagulated on warfarin   . Left hemiparesis ([place])   . History of CVA with residual deficit   . Thrombocytopenia ([place])   . Prediabetes   . CVA (cerebral vascular accident) ([place]) 09/03/2019  . Vertigo 09/24/2017  . Benign paroxysmal positional vertigo   . History of stroke   . Hyperlipemia 08/23/2017  . Encounter for therapeutic drug monitoring 08/08/2017  . Long term (current) use of anticoagulants [Z79.01] 07/12/2017  . Hemiparesis and alteration of sensations as late effects of stroke ([place]) 09/07/2016  . Abnormality of gait 09/07/2016  . HTN (hypertension) 04/13/2016  . H/O mitral valve replacement with mechanical valve 10/16/2015  . Diabetes mellitus without complication ([place])   . Hemiparesis (L sided - mild) due to old stroke ([place])     Willow Ora, PTA, [facility] [address], [place], [place] [address] [phone] 12/06/19, 10:08 PM   Name: [name] MRN: [id number] Date of Birth: 19-Mar-1946

## 2019-12-10 ENCOUNTER — Ambulatory Visit (INDEPENDENT_AMBULATORY_CARE_PROVIDER_SITE_OTHER): Payer: Medicare Other | Admitting: Internal Medicine

## 2019-12-10 DIAGNOSIS — Z5181 Encounter for therapeutic drug level monitoring: Secondary | ICD-10-CM | POA: Diagnosis not present

## 2019-12-10 LAB — POCT INR: INR: 3.5 — AB (ref 2.0–3.0)

## 2019-12-10 NOTE — Patient Instructions (Signed)
Description   Continue same dosage 5mg  daily except 7.5mg  on Mondays and Fridays. Pt will continue greens with 2 cups of lettuce on Fridays. Does weekly checks due to insurance. Recheck in INR in 1 week.

## 2019-12-12 ENCOUNTER — Other Ambulatory Visit: Payer: Self-pay

## 2019-12-12 ENCOUNTER — Ambulatory Visit: Payer: Medicare Other | Attending: Physical Medicine & Rehabilitation

## 2019-12-12 DIAGNOSIS — M6281 Muscle weakness (generalized): Secondary | ICD-10-CM | POA: Diagnosis not present

## 2019-12-12 DIAGNOSIS — I69354 Hemiplegia and hemiparesis following cerebral infarction affecting left non-dominant side: Secondary | ICD-10-CM | POA: Diagnosis not present

## 2019-12-12 DIAGNOSIS — R2689 Other abnormalities of gait and mobility: Secondary | ICD-10-CM | POA: Diagnosis not present

## 2019-12-12 NOTE — Therapy (Signed)
[facility] [address] [place], [place], [address] Phone: [phone]   Fax:  [phone]  Physical Therapy Treatment  Patient Details  Name: [name] MRN: [id number] Date of Birth: November 24, 1945 Referring Provider (PT): [name], MD   Encounter Date: 12/12/2019  PT End of Session - 12/12/19 1153    Visit Number  18    Number of Visits  33    Date for PT Re-Evaluation  02/09/20    Authorization Type  MCR    PT Start Time  1150    PT Stop Time  1230    PT Time Calculation (min)  40 min    Equipment Utilized During Treatment  Gait belt    Activity Tolerance  Patient tolerated treatment well;No increased pain    Behavior During Therapy  WFL for tasks assessed/performed       Past Medical History:  Diagnosis Date  . Abnormality of gait 09/07/2016  . Allergy   . Diabetes mellitus without complication [facility])    Patient denies this - notes history of glucose intolerance  . Hemiparesis and alteration of sensations as late effects of stroke ([place]) 09/07/2016  . History of pneumonia 1997  . S/P MVR (mitral valve replacement)    Mechanical mitral valve replacement at age 64 (done in [place])  // echo 7/17: EF 55-60, normal wall motion, bileaflet mechanical mitral valve prosthesis functioning normally, mild LAE, mildly reduced RVSF, small pericardial effusion  . Stroke [facility]) 1997, 2013, 2015    Past Surgical History:  Procedure Laterality Date  . ABDOMINAL HYSTERECTOMY    . BRAIN SURGERY    . BUNIONECTOMY  1993  . CARDIAC VALVE REPLACEMENT  1997  . TOE SURGERY  1996    There were no vitals filed for this visit.  Subjective Assessment - 12/12/19 1152    Subjective  Pt denies any new issues.    Patient is accompained by:  Family member   spouse   How long can you sit comfortably?  1-2 min    Diagnostic tests  MRI showed old hemorrhagic infarct in the right parietal cortical and subcortical brain which  had progressed atrophy, encephalomalacia.  Suspicion of minor acute extension along the anterior margin of the old infarction affecting the deep white matter and small focus adjacent cortical brain.    Currently in Pain?  Yes    Pain Score  --   just a little throbbing   Pain Location  Hip    Pain Orientation  Left    Pain Descriptors / Indicators  Throbbing    Pain Type  Chronic pain    Pain Onset  More than a month ago                       [facility] Adult PT Treatment/Exercise - 12/12/19 1153      Ambulation/Gait   Ambulation/Gait  Yes    Ambulation/Gait Assistance  5: Supervision    Ambulation/Gait Assistance Details  PT provided tactile cues to help increase left weight shift and increase right step length    Ambulation Distance (Feet)  345 Feet    Assistive device  Straight cane    Gait Pattern  Step-through pattern;Decreased step length - right;Decreased weight shift to left    Ambulation Surface  Level;Indoor    Gait Comments  Pt ambulated another 115' at end of session with SPC with cues to increase right step length.  Neuro Re-ed    Neuro Re-ed Details   Reciprocal steps over 2 4" foams and 2 yard sticks with SPC x 8 laps CGA, side stepping over the same going to the left but then had difficulty clearing with going to the right. Pt held to // bars and then performed stepping back and forth side stepping with verbal cues to lift and then move on left. Started with 2' foam and then progressed to 4" foam x 10 reps. Stepping forward and back with left leg over foam x 10. Standing on rockerboard positioned side to side trying to maintain level x 30 sec then with arm movements overhead/to side/across chest x 10 CGA with visual cues for mirror. Tapping 2nd step with right LE x 10 to facilitate left weight shift.             PT Education - 12/12/19 1646    Education Details  Pt and husband instructed to work more on increasing right step with gait to help spend  more time on left leg    Person(s) Educated  Patient;Spouse    Methods  Explanation;Demonstration    Comprehension  Verbalized understanding;Returned demonstration       PT Short Term Goals - 12/03/19 1153      PT SHORT TERM GOAL #1   Title  Pt will ambulate up/down flight of steps with 1 rail mod I for improved mobility and community access.    Time  4    Period  Weeks    Status  New    Target Date  01/02/20      PT SHORT TERM GOAL #2   Title  Pt will decrease TUG from 18. 6 sec to <16 sec for improved balance and functional mobility.    Baseline  12/03/19 TUG=18.6 sec    Time  4    Period  Weeks    Status  New    Target Date  01/02/20        PT Long Term Goals - 12/03/19 1155      PT LONG TERM GOAL #1   Title  Patient to be independent with advanced HEP.    Baseline  PT continues to progress HEP    Time  8    Period  Weeks    Status  On-going    Target Date  02/09/20      PT LONG TERM GOAL #2   Title  Pt to ambulate >500' varied surfaces with SPC mod I for improved community mobility.    Time  8    Period  Weeks    Status  Revised    Target Date  02/09/20      PT LONG TERM GOAL #3   Title  Patient to score <20 sec on 5xSTS without UE support to decrease risk of falls and improved functional strength.    Baseline  24 sec on 5x sit to stand    Time  8    Period  Weeks    Status  On-going    Target Date  02/09/20      PT LONG TERM GOAL #4   Title  Patient to improve gait speed to 2.62 ft/sec with [facility] for improved community mobility.    Baseline  12/03/19 gait speed=2.64ft/ sec    Time  8    Period  Weeks    Status  Revised    Target Date  02/09/20  Plan - 12/12/19 1646    Clinical Impression Statement  PT focused on increasing right step length during session to help facilitate left weight shift and increased left stance time. Pt did well with cuing. Minimal increases in left hip pain up to 2/10 with activities.    Personal Factors and  Comorbidities  Comorbidity 3+    Examination-Activity Limitations  Locomotion Level;Transfers;Reach Overhead;Carry;Squat;Stairs;Lift;Stand    Examination-Participation Restrictions  Meal Prep;Cleaning;Community Activity;Driving;Laundry;Shop    Stability/Clinical Decision Making  Evolving/Moderate complexity    Rehab Potential  Good    PT Frequency  2x / week    PT Duration  8 weeks    PT Treatment/Interventions  Passive range of motion;Iontophoresis 4mg /ml Dexamethasone;Ultrasound;Electrical Stimulation;Cryotherapy;Manual techniques;Therapeutic exercise;Therapeutic activities;Stair training;Gait training;Balance training;Neuromuscular re-education;Moist Heat;Patient/family education    PT Next Visit Plan  Continue with left LE strengthening focusing on hip, weight shifting to left activities, dynamic gait and balance.    PT Home Exercise Plan  Access Code: [email or web address]    Consulted and Agree with Plan of Care  Patient       Patient will benefit from skilled therapeutic intervention in order to improve the following deficits and impairments:  Abnormal gait, Decreased activity tolerance, Decreased balance, Decreased coordination, Decreased endurance, Decreased mobility, Decreased range of motion, Decreased strength, Difficulty walking, Impaired UE functional use, Postural dysfunction, Improper body mechanics  Visit Diagnosis: Muscle weakness (generalized)  Other abnormalities of gait and mobility     Problem List Patient Active Problem List   Diagnosis Date Noted  . 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 Anticoagulated on warfarin   . Left hemiparesis ([place])   . History of CVA with residual deficit   . Thrombocytopenia ([place])   . Prediabetes   . CVA (cerebral vascular accident) ([place]) 09/03/2019  . Vertigo 09/24/2017  . Benign paroxysmal positional vertigo   . History of stroke   . Hyperlipemia 08/23/2017  . Encounter for therapeutic drug monitoring 08/08/2017  . Long term (current)  use of anticoagulants [Z79.01] 07/12/2017  . Hemiparesis and alteration of sensations as late effects of stroke ([place]) 09/07/2016  . Abnormality of gait 09/07/2016  . HTN (hypertension) 04/13/2016  . H/O mitral valve replacement with mechanical valve 10/16/2015  . Diabetes mellitus without complication ([place])   . Hemiparesis (L sided - mild) due to old stroke ([place])     Electa Sniff, PT, DPT, NCS 12/12/2019, 4:48 PM  [facility] [address] [place], [place], [address] Phone: [phone]   Fax:  [phone]  Name: SIARAH SALYARDS MRN: [id number] Date of Birth: 1946-06-18

## 2019-12-14 ENCOUNTER — Other Ambulatory Visit: Payer: Self-pay

## 2019-12-14 ENCOUNTER — Encounter: Payer: Self-pay | Admitting: Physical Therapy

## 2019-12-14 ENCOUNTER — Ambulatory Visit: Payer: Medicare Other | Admitting: Physical Therapy

## 2019-12-14 ENCOUNTER — Ambulatory Visit: Payer: Medicare Other | Attending: Internal Medicine

## 2019-12-14 DIAGNOSIS — I69354 Hemiplegia and hemiparesis following cerebral infarction affecting left non-dominant side: Secondary | ICD-10-CM | POA: Diagnosis not present

## 2019-12-14 DIAGNOSIS — R2689 Other abnormalities of gait and mobility: Secondary | ICD-10-CM

## 2019-12-14 DIAGNOSIS — Z23 Encounter for immunization: Secondary | ICD-10-CM

## 2019-12-14 DIAGNOSIS — M6281 Muscle weakness (generalized): Secondary | ICD-10-CM | POA: Diagnosis not present

## 2019-12-14 NOTE — Therapy (Signed)
[facility] [address] [place], [place], [address] Phone: [phone]   Fax:  [phone]  Physical Therapy Treatment  Patient Details  Name: [name] MRN: [id number] Date of Birth: Aug 15, 1946 Referring Provider (PT): [name], MD   Encounter Date: 12/14/2019  PT End of Session - 12/14/19 1408    Visit Number  19    Number of Visits  33    Date for PT Re-Evaluation  02/09/20    Authorization Type  MCR    PT Start Time  1402    PT Stop Time  1445    PT Time Calculation (min)  43 min    Equipment Utilized During Treatment  Gait belt    Activity Tolerance  Patient tolerated treatment well;No increased pain    Behavior During Therapy  WFL for tasks assessed/performed       Past Medical History:  Diagnosis Date  . Abnormality of gait 09/07/2016  . Allergy   . Diabetes mellitus without complication [facility])    Patient denies this - notes history of glucose intolerance  . Hemiparesis and alteration of sensations as late effects of stroke ([place]) 09/07/2016  . History of pneumonia 1997  . S/P MVR (mitral valve replacement)    Mechanical mitral valve replacement at age 74 (done in [place])  // echo 7/17: EF 55-60, normal wall motion, bileaflet mechanical mitral valve prosthesis functioning normally, mild LAE, mildly reduced RVSF, small pericardial effusion  . Stroke [facility]) 1997, 2013, 2015    Past Surgical History:  Procedure Laterality Date  . ABDOMINAL HYSTERECTOMY    . BRAIN SURGERY    . BUNIONECTOMY  1993  . CARDIAC VALVE REPLACEMENT  1997  . TOE SURGERY  1996    There were no vitals filed for this visit.  Subjective Assessment - 12/14/19 1406    Subjective  No new complaints. No falls or pain to report. Did just get her 1st dose Covid vacinne this am.    Patient is accompained by:  Family member   spouse   How long can you sit comfortably?  1-2 min    Diagnostic tests  MRI showed old  hemorrhagic infarct in the right parietal cortical and subcortical brain which had progressed atrophy, encephalomalacia.  Suspicion of minor acute extension along the anterior margin of the old infarction affecting the deep white matter and small focus adjacent cortical brain.    Currently in Pain?  No/denies    Pain Score  0-No pain              [facility] Adult PT Treatment/Exercise - 12/14/19 1409      Transfers   Transfers  Sit to Stand;Stand to Sit    Sit to Stand  5: Supervision;With upper extremity assist;From bed;From chair/3-in-1    Stand to Sit  5: Supervision;With upper extremity assist;To bed;To chair/3-in-1      Ambulation/Gait   Ambulation/Gait  Yes    Ambulation/Gait Assistance  4: Min guard;4: Min assist    Ambulation/Gait Assistance Details  cues/facilitation provided for increased lateral shifting onto left LE in stance to alllow for increased right step length with gait.     Ambulation Distance (Feet)  345 Feet   x1,    Assistive device  Straight cane    Gait Pattern  Step-through pattern;Decreased step length - right;Decreased weight shift to left    Ambulation Surface  Level;Indoor          Balance Exercises -  12/14/19 1423      Balance Exercises: Standing   Standing Eyes Closed  Wide (BOA);Foam/compliant surface;3 reps;30 secs;Limitations    Standing Eyes Closed Limitations  on airex with feet hip width apart    SLS with Vectors  Solid surface;5 reps;Limitations    SLS with Vectors Limitations  standing on floor with color circles in front of left foot, next to it on right, then laterally to right (half semi circle): left stance with right foot taps to each circle for 5 laps, no UE support. Min assist for balance/weight shifting and cues for posture; standing on floor with 2 small foam bubbles in front: alternating fwd foot taps, then alternating cross foot taps with no UE support. Cues for stance and weight shifting with up to mod assist needed for balance.         Partial Tandem Stance  Foam/compliant surface;2 reps;20 secs;Eyes closed;Limitations    Partial Tandem Stance Limitations  on airex with no UE support, min guard to min assist for balance for 2 reps each foot forward.          PT Short Term Goals - 12/03/19 1153      PT SHORT TERM GOAL #1   Title  Pt will ambulate up/down flight of steps with 1 rail mod I for improved mobility and community access.    Time  4    Period  Weeks    Status  New    Target Date  01/02/20      PT SHORT TERM GOAL #2   Title  Pt will decrease TUG from 18. 6 sec to <16 sec for improved balance and functional mobility.    Baseline  12/03/19 TUG=18.6 sec    Time  4    Period  Weeks    Status  New    Target Date  01/02/20        PT Long Term Goals - 12/03/19 1155      PT LONG TERM GOAL #1   Title  Patient to be independent with advanced HEP.    Baseline  PT continues to progress HEP    Time  8    Period  Weeks    Status  On-going    Target Date  02/09/20      PT LONG TERM GOAL #2   Title  Pt to ambulate >500' varied surfaces with SPC mod I for improved community mobility.    Time  8    Period  Weeks    Status  Revised    Target Date  02/09/20      PT LONG TERM GOAL #3   Title  Patient to score <20 sec on 5xSTS without UE support to decrease risk of falls and improved functional strength.    Baseline  24 sec on 5x sit to stand    Time  8    Period  Weeks    Status  On-going    Target Date  02/09/20      PT LONG TERM GOAL #4   Title  Patient to improve gait speed to 2.62 ft/sec with [facility] for improved community mobility.    Baseline  12/03/19 gait speed=2.22ft/ sec    Time  8    Period  Weeks    Status  Revised    Target Date  02/09/20            Plan - 12/14/19 1409    Clinical Impression Statement  Todays skilled session continued  to focus on increasing right step length with gait, left single leg stance balance and left side weight shifting. Minimal increase in pain during  activity that resolved with seated rest breaks. The pt is progressing toward goals and should benefit from continued PT to progress toward unmet goals.    Personal Factors and Comorbidities  Comorbidity 3+    Examination-Activity Limitations  Locomotion Level;Transfers;Reach Overhead;Carry;Squat;Stairs;Lift;Stand    Examination-Participation Restrictions  Meal Prep;Cleaning;Community Activity;Driving;Laundry;Shop    Stability/Clinical Decision Making  Evolving/Moderate complexity    Rehab Potential  Good    PT Frequency  2x / week    PT Duration  8 weeks    PT Treatment/Interventions  Passive range of motion;Iontophoresis 4mg /ml Dexamethasone;Ultrasound;Electrical Stimulation;Cryotherapy;Manual techniques;Therapeutic exercise;Therapeutic activities;Stair training;Gait training;Balance training;Neuromuscular re-education;Moist Heat;Patient/family education    PT Next Visit Plan  Continue with left LE strengthening focusing on hip strenthening, weight shifting to left activities, dynamic gait and balance.    PT Home Exercise Plan  Access Code: [email or web address]    Consulted and Agree with Plan of Care  Patient       Patient will benefit from skilled therapeutic intervention in order to improve the following deficits and impairments:  Abnormal gait, Decreased activity tolerance, Decreased balance, Decreased coordination, Decreased endurance, Decreased mobility, Decreased range of motion, Decreased strength, Difficulty walking, Impaired UE functional use, Postural dysfunction, Improper body mechanics  Visit Diagnosis: Muscle weakness (generalized)  Other abnormalities of gait and mobility     Problem List Patient Active Problem List   Diagnosis Date Noted  . 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 Anticoagulated on warfarin   . Left hemiparesis ([place])   . History of CVA with residual deficit   . Thrombocytopenia ([place])   . Prediabetes   . CVA (cerebral vascular accident) ([place]) 09/03/2019  .  Vertigo 09/24/2017  . Benign paroxysmal positional vertigo   . History of stroke   . Hyperlipemia 08/23/2017  . Encounter for therapeutic drug monitoring 08/08/2017  . Long term (current) use of anticoagulants [Z79.01] 07/12/2017  . Hemiparesis and alteration of sensations as late effects of stroke ([place]) 09/07/2016  . Abnormality of gait 09/07/2016  . HTN (hypertension) 04/13/2016  . H/O mitral valve replacement with mechanical valve 10/16/2015  . Diabetes mellitus without complication ([place])   . Hemiparesis (L sided - mild) due to old stroke ([place])    Willow Ora, PTA, [facility] [address] [place], [place] [address] [phone] 12/15/19, 9:40 AM   Name: TALEIA NISHIHARA MRN: [id number] Date of Birth: May 28, 1946

## 2019-12-14 NOTE — Progress Notes (Signed)
° °  Covid-19 Vaccination Clinic  Name:  [name]    MRN: [id number] DOB: 04/25/1946  12/14/2019  [name] was observed post Covid-19 immunization for 15 minutes without incidence. She was provided with Vaccine Information Sheet and instruction to access the V-Safe system.   [name] was instructed to call 911 with any severe reactions post vaccine:  Difficulty breathing   Swelling of your face and throat   A fast heartbeat   A bad rash all over your body   Dizziness and weakness    Immunizations Administered    Name Date Dose VIS Date Route   Pfizer COVID-19 Vaccine 12/14/2019  8:33 AM 0.3 mL 10/19/2019 Intramuscular   Manufacturer: [place]   Lot: [id number]   [place]: [id number]

## 2019-12-17 ENCOUNTER — Other Ambulatory Visit: Payer: Self-pay | Admitting: Cardiovascular Disease

## 2019-12-17 ENCOUNTER — Ambulatory Visit (INDEPENDENT_AMBULATORY_CARE_PROVIDER_SITE_OTHER): Payer: Medicare Other | Admitting: *Deleted

## 2019-12-17 DIAGNOSIS — Z5181 Encounter for therapeutic drug level monitoring: Secondary | ICD-10-CM

## 2019-12-17 DIAGNOSIS — Z7901 Long term (current) use of anticoagulants: Secondary | ICD-10-CM | POA: Diagnosis not present

## 2019-12-17 DIAGNOSIS — I69359 Hemiplegia and hemiparesis following cerebral infarction affecting unspecified side: Secondary | ICD-10-CM

## 2019-12-17 DIAGNOSIS — Z952 Presence of prosthetic heart valve: Secondary | ICD-10-CM

## 2019-12-17 LAB — POCT INR: INR: 3.4 — AB (ref 2.0–3.0)

## 2019-12-17 MED ORDER — WARFARIN SODIUM 5 MG PO TABS: ORAL_TABLET | ORAL | 3 refills | Status: DC

## 2019-12-17 NOTE — Patient Instructions (Signed)
Description   Continue same dosage 5mg  daily except 7.5mg  on Mondays and Fridays. Pt will continue greens with 2 cups of lettuce on Fridays. Does weekly checks due to insurance. Recheck in INR in 1 week.

## 2019-12-18 ENCOUNTER — Other Ambulatory Visit: Payer: Self-pay

## 2019-12-18 ENCOUNTER — Encounter: Payer: Self-pay | Admitting: Physical Therapy

## 2019-12-18 ENCOUNTER — Ambulatory Visit: Payer: Medicare Other | Admitting: Physical Therapy

## 2019-12-18 DIAGNOSIS — I69354 Hemiplegia and hemiparesis following cerebral infarction affecting left non-dominant side: Secondary | ICD-10-CM

## 2019-12-18 DIAGNOSIS — M6281 Muscle weakness (generalized): Secondary | ICD-10-CM | POA: Diagnosis not present

## 2019-12-18 DIAGNOSIS — R2689 Other abnormalities of gait and mobility: Secondary | ICD-10-CM

## 2019-12-19 NOTE — Therapy (Addendum)
[facility] [address] [place], [place], [address] Phone: [phone]   Fax:  [phone]  Physical Therapy Treatment/ 10th visit progress note  Patient Details  Name: [name] MRN: [id number] Date of Birth: 12-27-1945 Referring Provider (PT): Charlett Blake, MD   Encounter Date: 12/18/2019    12/18/19 1021  PT Visits / Re-Eval  Visit Number 20  Number of Visits 33  Date for PT Re-Evaluation 02/09/20  Authorization  Authorization Type MCR  PT Time Calculation  PT Start Time 1018  PT Stop Time 1100  PT Time Calculation (min) 42 min  PT - End of Session  Equipment Utilized During Treatment Gait belt  Activity Tolerance Patient tolerated treatment well;No increased pain  Behavior During Therapy WFL for tasks assessed/performed     Past Medical History:  Diagnosis Date  . Abnormality of gait 09/07/2016  . Allergy   . Diabetes mellitus without complication [facility])    Patient denies this - notes history of glucose intolerance  . Hemiparesis and alteration of sensations as late effects of stroke ([place]) 09/07/2016  . History of pneumonia 1997  . S/P MVR (mitral valve replacement)    Mechanical mitral valve replacement at age 27 (done in [place])  // echo 7/17: EF 55-60, normal wall motion, bileaflet mechanical mitral valve prosthesis functioning normally, mild LAE, mildly reduced RVSF, small pericardial effusion  . Stroke [facility]) 1997, 2013, 2015    Past Surgical History:  Procedure Laterality Date  . ABDOMINAL HYSTERECTOMY    . BRAIN SURGERY    . BUNIONECTOMY  1993  . CARDIAC VALVE REPLACEMENT  1997  . TOE SURGERY  1996    There were no vitals filed for this visit.     12/18/19 1019  Symptoms/Limitations  Subjective No new complaints. No falls. Having some pain in the hip today, 1/10.  Patient is accompained by: Family member (spouse)  How long can you sit comfortably? 1-2 min  Diagnostic  tests MRI showed old hemorrhagic infarct in the right parietal cortical and subcortical brain which had progressed atrophy, encephalomalacia.  Suspicion of minor acute extension along the anterior margin of the old infarction affecting the deep white matter and small focus adjacent cortical brain.  Pain Assessment  Currently in Pain? Yes  Pain Location Hip  Pain Orientation Left  Pain Descriptors / Indicators Sore;Tender  Pain Type Chronic pain  Pain Onset More than a month ago  Pain Frequency Intermittent  Aggravating Factors  standing too long, walking too long  Pain Relieving Factors bengay, tyelnol, ice massage      12/18/19 1023  Ambulation/Gait  Ambulation/Gait Yes  Ambulation/Gait Assistance 4: Min guard;4: Min assist  Ambulation/Gait Assistance Details around gym with session. cues/facilitation working on increased right weight shifitng for increased left step length with all gait.  Assistive device Straight cane  Gait Pattern Step-through pattern;Decreased step length - right;Decreased weight shift to left  Ambulation Surface Level;Indoor  Exercises  Exercises Other Exercises  Other Exercises  [place]ift level 3.0 LE's only for 5 minutes with goal >/=30 for stengthening/activitiy tolerance; Legpress with bil LE's: 50# with ball squeeze for 2 sets of 10 reps, then with right LE only- 30# for 2 sets of 10 reps. cues for slow, controlled movements with assist for knee control on right LE.       12/18/19 1043  Balance Exercises: Standing  Standing Eyes Closed Wide (BOA);Foam/compliant surface;Limitations;3 reps;20 secs  Standing Eyes Closed Limitations standing across blue foam  beam with feet apart: EC no head movements with min/mod assist for balance. cues on posture/weight shifting for balance.   Balance Beam standing across blue foam beam with light UE support on bars: fwd stepping to floor/back onto beam, then bwd stepping to floor/back onto beam for 10 reps each, cues for  increased step length/height to clear beam surface.  min guard to min assist for balance.        PT Short Term Goals - 12/03/19 1153      PT SHORT TERM GOAL #1   Title  Pt will ambulate up/down flight of steps with 1 rail mod I for improved mobility and community access.    Time  4    Period  Weeks    Status  New    Target Date  01/02/20      PT SHORT TERM GOAL #2   Title  Pt will decrease TUG from 18. 6 sec to <16 sec for improved balance and functional mobility.    Baseline  12/03/19 TUG=18.6 sec    Time  4    Period  Weeks    Status  New    Target Date  01/02/20        PT Long Term Goals - 12/03/19 1155      PT LONG TERM GOAL #1   Title  Patient to be independent with advanced HEP.    Baseline  PT continues to progress HEP    Time  8    Period  Weeks    Status  On-going    Target Date  02/09/20      PT LONG TERM GOAL #2   Title  Pt to ambulate >500' varied surfaces with SPC mod I for improved community mobility.    Time  8    Period  Weeks    Status  Revised    Target Date  02/09/20      PT LONG TERM GOAL #3   Title  Patient to score <20 sec on 5xSTS without UE support to decrease risk of falls and improved functional strength.    Baseline  24 sec on 5x sit to stand    Time  8    Period  Weeks    Status  On-going    Target Date  02/09/20      PT LONG TERM GOAL #4   Title  Patient to improve gait speed to 2.62 ft/sec with [facility] for improved community mobility.    Baseline  12/03/19 gait speed=2.5ft/ sec    Time  8    Period  Weeks    Status  Revised    Target Date  02/09/20          12/18/19 1021  Plan  Clinical Impression Statement Today's skilled session continued to focus on gait mechanics, LE strengthening and balance reactions with rest breaks taken due to fatigue. Mild increase in hip/leg pain that resolved with rest breaks. The pt is making steady progress toward goals and should benefit from continued PT to progress toward unmet goals. Addendum  12/20/19: Pt continues to show progress with increasing gait distance with focus on improving gait quality with increasing left weight shift. Pt tolerating more activity with less severe hip pain at this time.  Personal Factors and Comorbidities Comorbidity 3+  Examination-Activity Limitations Locomotion Level;Transfers;Reach Overhead;Carry;Squat;Stairs;Lift;Stand  Examination-Participation Restrictions Meal Prep;Cleaning;Community Activity;Driving;Laundry;Shop  Pt will benefit from skilled therapeutic intervention in order to improve on the following deficits Abnormal gait;Decreased activity tolerance;Decreased  balance;Decreased coordination;Decreased endurance;Decreased mobility;Decreased range of motion;Decreased strength;Difficulty walking;Impaired UE functional use;Postural dysfunction;Improper body mechanics  Stability/Clinical Decision Making Evolving/Moderate complexity  Rehab Potential Good  PT Frequency 2x / week  PT Duration 8 weeks  PT Treatment/Interventions Passive range of motion;Iontophoresis 4mg /ml Dexamethasone;Ultrasound;Electrical Stimulation;Cryotherapy;Manual techniques;Therapeutic exercise;Therapeutic activities;Stair training;Gait training;Balance training;Neuromuscular re-education;Moist Heat;Patient/family education  PT Next Visit Plan Continue with left LE strengthening focusing on hip strenthening, weight shifting to left activities, dynamic gait and balance.  PT Home Exercise Plan Access Code: [email or web address]  Consulted and Agree with Plan of Care Patient         Patient will benefit from skilled therapeutic intervention in order to improve the following deficits and impairments:  Abnormal gait, Decreased activity tolerance, Decreased balance, Decreased coordination, Decreased endurance, Decreased mobility, Decreased range of motion, Decreased strength, Difficulty walking, Impaired UE functional use, Postural dysfunction, Improper body mechanics  Visit Diagnosis: Muscle  weakness (generalized)  Other abnormalities of gait and mobility  Hemiplegia and hemiparesis following cerebral infarction affecting left non-dominant side [facility])     Problem List Patient Active Problem List   Diagnosis Date Noted  . 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 Anticoagulated on warfarin   . Left hemiparesis ([place])   . History of CVA with residual deficit   . Thrombocytopenia ([place])   . Prediabetes   . CVA (cerebral vascular accident) ([place]) 09/03/2019  . Vertigo 09/24/2017  . Benign paroxysmal positional vertigo   . History of stroke   . Hyperlipemia 08/23/2017  . Encounter for therapeutic drug monitoring 08/08/2017  . Long term (current) use of anticoagulants [Z79.01] 07/12/2017  . Hemiparesis and alteration of sensations as late effects of stroke ([place]) 09/07/2016  . Abnormality of gait 09/07/2016  . HTN (hypertension) 04/13/2016  . H/O mitral valve replacement with mechanical valve 10/16/2015  . Diabetes mellitus without complication ([place])   . Hemiparesis (L sided - mild) due to old stroke [facility])      Progress Note (addendum)  Reporting period 11/15/19 to 12/18/19  See Note above for Objective Data and Assessment of Progress/Goals    Willow Ora, PTA, [place] [address] [place], [place] [address] [phone] 12/19/19, 8:06 PM   Cherly Anderson, PT, DPT, NCS   Name: [name] MRN: [id number] Date of Birth: 02-03-1946

## 2019-12-20 ENCOUNTER — Ambulatory Visit: Payer: Medicare Other

## 2019-12-20 ENCOUNTER — Other Ambulatory Visit: Payer: Self-pay

## 2019-12-20 DIAGNOSIS — M6281 Muscle weakness (generalized): Secondary | ICD-10-CM

## 2019-12-20 DIAGNOSIS — R2689 Other abnormalities of gait and mobility: Secondary | ICD-10-CM

## 2019-12-20 DIAGNOSIS — I69354 Hemiplegia and hemiparesis following cerebral infarction affecting left non-dominant side: Secondary | ICD-10-CM | POA: Diagnosis not present

## 2019-12-20 NOTE — Therapy (Signed)
[facility] [address] [place], [place], [address] Phone: [phone]   Fax:  [phone]  Physical Therapy Treatment  Patient Details  Name: [name] MRN: [id number] Date of Birth: 07/02/1946 Referring Provider (PT): [name], MD   Encounter Date: 12/20/2019  PT End of Session - 12/20/19 1108    Visit Number  21    Number of Visits  33    Date for PT Re-Evaluation  02/09/20    Authorization Type  MCR    PT Start Time  1105    PT Stop Time  1144    PT Time Calculation (min)  39 min    Equipment Utilized During Treatment  Gait belt    Activity Tolerance  Patient tolerated treatment well;No increased pain    Behavior During Therapy  WFL for tasks assessed/performed       Past Medical History:  Diagnosis Date  . Abnormality of gait 09/07/2016  . Allergy   . Diabetes mellitus without complication [facility])    Patient denies this - notes history of glucose intolerance  . Hemiparesis and alteration of sensations as late effects of stroke ([place]) 09/07/2016  . History of pneumonia 1997  . S/P MVR (mitral valve replacement)    Mechanical mitral valve replacement at age 21 (done in [place])  // echo 7/17: EF 55-60, normal wall motion, bileaflet mechanical mitral valve prosthesis functioning normally, mild LAE, mildly reduced RVSF, small pericardial effusion  . Stroke [facility]) 1997, 2013, 2015    Past Surgical History:  Procedure Laterality Date  . ABDOMINAL HYSTERECTOMY    . BRAIN SURGERY    . BUNIONECTOMY  1993  . CARDIAC VALVE REPLACEMENT  1997  . TOE SURGERY  1996    There were no vitals filed for this visit.  Subjective Assessment - 12/20/19 1106    Subjective  Pt reports that she is doing well today but was really tired and more painful in left hip after last session for that day and some of next.    Patient is accompained by:  Family member   spouse   How long can you sit comfortably?  1-2 min     Diagnostic tests  MRI showed old hemorrhagic infarct in the right parietal cortical and subcortical brain which had progressed atrophy, encephalomalacia.  Suspicion of minor acute extension along the anterior margin of the old infarction affecting the deep white matter and small focus adjacent cortical brain.    Currently in Pain?  Yes    Pain Score  --   1.5   Pain Location  Hip    Pain Orientation  Left    Pain Descriptors / Indicators  Sore    Pain Onset  More than a month ago    Pain Frequency  Intermittent                       [facility] Adult PT Treatment/Exercise - 12/20/19 1108      Ambulation/Gait   Ambulation/Gait  Yes    Ambulation/Gait Assistance  6: Modified independent (Device/Increase time)    Ambulation/Gait Assistance Details  Pt was given verbal and tactile cues for pelvic rotation and to increase right step length slightly. Hip did well    Ambulation Distance (Feet)  460 Feet    Assistive device  Straight cane   left AFO   Gait Pattern  Step-through pattern;Decreased step length - right;Decreased weight shift to left  Ambulation Surface  Level;Indoor    Ramp  5: Supervision    Ramp Details (indicate cue type and reason)  Performed x 3 with SPC    Curb  5: Supervision;4: Min assist    Curb Details (indicate cue type and reason)  Pt attempted x 3 and could not come all the way up with cane. Broke down in // bars and then repeated x3 and was able clear left foot this time. Cued to just go and not slow movement halfway. Pt needed cues for proper cane placement on curb.    Pre-Gait Activities  In // bars: step-ups on 4" step with right rail x 3 then x 3 with [place] supervision. Pt then performed the same on 6" step. Verbal cues for weight shift and to use fluid motion stepping up.      Neuro Re-ed    Neuro Re-ed Details   Obstacle course: walking over mat, over 4 hurdles, weaving in and out of 5 cones with SPC CGA x 6 bouts. Reciprocal steps over 4 hurdles on  own with [facility] with verbal cues to lift feet up and over. Performed x 6. Pt caught left foot a couple times on toe and bumped hurdle with heel a couple times. Pt reports she really has to concentrate. CGA for safety.             PT Education - 12/20/19 1211    Education Details  Pt to continue with current HEP    Person(s) Educated  Patient    Methods  Explanation    Comprehension  Verbalized understanding       PT Short Term Goals - 12/03/19 1153      PT SHORT TERM GOAL #1   Title  Pt will ambulate up/down flight of steps with 1 rail mod I for improved mobility and community access.    Time  4    Period  Weeks    Status  New    Target Date  01/02/20      PT SHORT TERM GOAL #2   Title  Pt will decrease TUG from 18. 6 sec to <16 sec for improved balance and functional mobility.    Baseline  12/03/19 TUG=18.6 sec    Time  4    Period  Weeks    Status  New    Target Date  01/02/20        PT Long Term Goals - 12/03/19 1155      PT LONG TERM GOAL #1   Title  Patient to be independent with advanced HEP.    Baseline  PT continues to progress HEP    Time  8    Period  Weeks    Status  On-going    Target Date  02/09/20      PT LONG TERM GOAL #2   Title  Pt to ambulate >500' varied surfaces with SPC mod I for improved community mobility.    Time  8    Period  Weeks    Status  Revised    Target Date  02/09/20      PT LONG TERM GOAL #3   Title  Patient to score <20 sec on 5xSTS without UE support to decrease risk of falls and improved functional strength.    Baseline  24 sec on 5x sit to stand    Time  8    Period  Weeks    Status  On-going    Target Date  02/09/20      PT LONG TERM GOAL #4   Title  Patient to improve gait speed to 2.62 ft/sec with [facility] for improved community mobility.    Baseline  12/03/19 gait speed=2.75ft/ sec    Time  8    Period  Weeks    Status  Revised    Target Date  02/09/20            Plan - 12/20/19 1212    Clinical Impression  Statement  Pt showed improved stance time on left leg during gait today. Treatment focused on stepping activities over various surfaces. Pt struggled with stepping up on curb with cane initially but was more fear/ coordination of movement issue. After breaking down task pt was able to perform.    Personal Factors and Comorbidities  Comorbidity 3+    Examination-Activity Limitations  Locomotion Level;Transfers;Reach Overhead;Carry;Squat;Stairs;Lift;Stand    Examination-Participation Restrictions  Meal Prep;Cleaning;Community Activity;Driving;Laundry;Shop    Stability/Clinical Decision Making  Evolving/Moderate complexity    Rehab Potential  Good    PT Frequency  2x / week    PT Duration  8 weeks    PT Treatment/Interventions  Passive range of motion;Iontophoresis 4mg /ml Dexamethasone;Ultrasound;Electrical Stimulation;Cryotherapy;Manual techniques;Therapeutic exercise;Therapeutic activities;Stair training;Gait training;Balance training;Neuromuscular re-education;Moist Heat;Patient/family education    PT Next Visit Plan  Continue with left LE strengthening focusing on hip strenthening, weight shifting to left activities, dynamic gait and balance.    PT Home Exercise Plan  Access Code: [email or web address]    Consulted and Agree with Plan of Care  Patient       Patient will benefit from skilled therapeutic intervention in order to improve the following deficits and impairments:  Abnormal gait, Decreased activity tolerance, Decreased balance, Decreased coordination, Decreased endurance, Decreased mobility, Decreased range of motion, Decreased strength, Difficulty walking, Impaired UE functional use, Postural dysfunction, Improper body mechanics  Visit Diagnosis: Muscle weakness (generalized)  Other abnormalities of gait and mobility     Problem List Patient Active Problem List   Diagnosis Date Noted  . 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 Anticoagulated on warfarin   . Left hemiparesis ([place])   .  History of CVA with residual deficit   . Thrombocytopenia ([place])   . Prediabetes   . CVA (cerebral vascular accident) ([place]) 09/03/2019  . Vertigo 09/24/2017  . Benign paroxysmal positional vertigo   . History of stroke   . Hyperlipemia 08/23/2017  . Encounter for therapeutic drug monitoring 08/08/2017  . Long term (current) use of anticoagulants [Z79.01] 07/12/2017  . Hemiparesis and alteration of sensations as late effects of stroke ([place]) 09/07/2016  . Abnormality of gait 09/07/2016  . HTN (hypertension) 04/13/2016  . H/O mitral valve replacement with mechanical valve 10/16/2015  . Diabetes mellitus without complication ([place])   . Hemiparesis (L sided - mild) due to old stroke ([place])     Electa Sniff, PT, DPT, NCS 12/20/2019, 12:15 PM  [facility] [address] [place], [place], [address] Phone: [phone]   Fax:  [phone]  Name: [name] MRN: [id number] Date of Birth: [birth year]

## 2019-12-23 ENCOUNTER — Ambulatory Visit: Payer: Medicare Other

## 2019-12-24 ENCOUNTER — Other Ambulatory Visit: Payer: Self-pay

## 2019-12-24 ENCOUNTER — Ambulatory Visit (INDEPENDENT_AMBULATORY_CARE_PROVIDER_SITE_OTHER): Payer: Medicare Other

## 2019-12-24 ENCOUNTER — Ambulatory Visit: Payer: Medicare Other

## 2019-12-24 DIAGNOSIS — I69354 Hemiplegia and hemiparesis following cerebral infarction affecting left non-dominant side: Secondary | ICD-10-CM | POA: Diagnosis not present

## 2019-12-24 DIAGNOSIS — R2689 Other abnormalities of gait and mobility: Secondary | ICD-10-CM

## 2019-12-24 DIAGNOSIS — I69359 Hemiplegia and hemiparesis following cerebral infarction affecting unspecified side: Secondary | ICD-10-CM

## 2019-12-24 DIAGNOSIS — Z7901 Long term (current) use of anticoagulants: Secondary | ICD-10-CM

## 2019-12-24 DIAGNOSIS — Z952 Presence of prosthetic heart valve: Secondary | ICD-10-CM | POA: Diagnosis not present

## 2019-12-24 DIAGNOSIS — Z5181 Encounter for therapeutic drug level monitoring: Secondary | ICD-10-CM | POA: Diagnosis not present

## 2019-12-24 DIAGNOSIS — M6281 Muscle weakness (generalized): Secondary | ICD-10-CM | POA: Diagnosis not present

## 2019-12-24 LAB — POCT INR: INR: 2.8 (ref 2.0–3.0)

## 2019-12-24 NOTE — Patient Instructions (Signed)
Description   Spoke with pt advised to take 10mg  today, then resume same dosage 5mg  daily except 7.5mg  on Mondays and Fridays. Pt will continue greens with 2 cups of lettuce on Fridays. Does weekly checks due to insurance. Recheck in INR in 1 week.

## 2019-12-24 NOTE — Therapy (Signed)
[facility] [address] [place], [place], [address] Phone: [phone]   Fax:  [phone]  Physical Therapy Treatment  Patient Details  Name: [name] MRN: [id number] Date of Birth: [birth year] Referring Provider (PT): [name], MD   Encounter Date: 12/24/2019  PT End of Session - 12/24/19 1019    Visit Number  22    Number of Visits  33    Date for PT Re-Evaluation  02/09/20    Authorization Type  MCR    PT Start Time  1015    PT Stop Time  1059    PT Time Calculation (min)  44 min    Equipment Utilized During Treatment  Gait belt    Activity Tolerance  Patient tolerated treatment well;No increased pain    Behavior During Therapy  WFL for tasks assessed/performed       Past Medical History:  Diagnosis Date  . Abnormality of gait 09/07/2016  . Allergy   . Diabetes mellitus without complication [facility])    Patient denies this - notes history of glucose intolerance  . Hemiparesis and alteration of sensations as late effects of stroke ([place]) 09/07/2016  . History of pneumonia 1997  . S/P MVR (mitral valve replacement)    Mechanical mitral valve replacement at age 89 (done in [place])  // echo 7/17: EF 55-60, normal wall motion, bileaflet mechanical mitral valve prosthesis functioning normally, mild LAE, mildly reduced RVSF, small pericardial effusion  . Stroke [facility]) 1997, 2013, 2015    Past Surgical History:  Procedure Laterality Date  . ABDOMINAL HYSTERECTOMY    . BRAIN SURGERY    . BUNIONECTOMY  1993  . CARDIAC VALVE REPLACEMENT  1997  . TOE SURGERY  1996    There were no vitals filed for this visit.  Subjective Assessment - 12/24/19 1018    Subjective  Pt reports doing well.    Patient is accompained by:  Family member   spouse   How long can you sit comfortably?  1-2 min    Diagnostic tests  MRI showed old hemorrhagic infarct in the right parietal cortical and subcortical brain which  had progressed atrophy, encephalomalacia.  Suspicion of minor acute extension along the anterior margin of the old infarction affecting the deep white matter and small focus adjacent cortical brain.    Currently in Pain?  No/denies    Pain Onset  More than a month ago                       [facility] Adult PT Treatment/Exercise - 12/24/19 1019      Ambulation/Gait   Ambulation/Gait  Yes    Ambulation/Gait Assistance  6: Modified independent (Device/Increase time);5: Supervision    Ambulation/Gait Assistance Details  Pt was given tactile cues to shift weight more to left and verbal cues to increase right step length.    Ambulation Distance (Feet)  460 Feet    Assistive device  Straight cane    Gait Pattern  Step-through pattern;Decreased step length - right    Ambulation Surface  Level;Indoor    Ramp  5: Supervision    Ramp Details (indicate cue type and reason)  performed x 3 with SPC. Pt takes smaller steps with descent and less confident.    Curb  5: Supervision    Curb Details (indicate cue type and reason)  Performed x 3 with left foot leading first 2 and up and right foot trialed last  time. Pt felt more comfortable with left foot leading so she could see that it cleared. SPC used.      Neuro Re-ed    Neuro Re-ed Details   Stepping over stepping 3 stepping  stones with reciprocal pattern with SPC x 6 bouts CGA. Stepping over 4 cones with SPC for increased hip flexion with reciprocal pattern x 8. Side stepping over 4 cones with rail support x 6. Step-ups on 4" step x 10 right then x 15 left with SPC support and visual cues in mirror.  Standing on ramp facing uphill without UE support: 30 sec eyes open, 30 sec eyes closed, head turns up/down and left/right x 10 each CGA. Pt with increased sway with eyes closed.             PT Education - 12/24/19 1216    Education Details  Pt to continue with current HEP    Person(s) Educated  Patient    Methods  Explanation     Comprehension  Verbalized understanding       PT Short Term Goals - 12/03/19 1153      PT SHORT TERM GOAL #1   Title  Pt will ambulate up/down flight of steps with 1 rail mod I for improved mobility and community access.    Time  4    Period  Weeks    Status  New    Target Date  01/02/20      PT SHORT TERM GOAL #2   Title  Pt will decrease TUG from 18. 6 sec to <16 sec for improved balance and functional mobility.    Baseline  12/03/19 TUG=18.6 sec    Time  4    Period  Weeks    Status  New    Target Date  01/02/20        PT Long Term Goals - 12/03/19 1155      PT LONG TERM GOAL #1   Title  Patient to be independent with advanced HEP.    Baseline  PT continues to progress HEP    Time  8    Period  Weeks    Status  On-going    Target Date  02/09/20      PT LONG TERM GOAL #2   Title  Pt to ambulate >500' varied surfaces with SPC mod I for improved community mobility.    Time  8    Period  Weeks    Status  Revised    Target Date  02/09/20      PT LONG TERM GOAL #3   Title  Patient to score <20 sec on 5xSTS without UE support to decrease risk of falls and improved functional strength.    Baseline  24 sec on 5x sit to stand    Time  8    Period  Weeks    Status  On-going    Target Date  02/09/20      PT LONG TERM GOAL #4   Title  Patient to improve gait speed to 2.62 ft/sec with [facility] for improved community mobility.    Baseline  12/03/19 gait speed=2.55ft/ sec    Time  8    Period  Weeks    Status  Revised    Target Date  02/09/20            Plan - 12/24/19 1217    Clinical Impression Statement  Pt needs cuing to increase right step length at times to help increase  left stance time. Pt challenged with stepping up/over objects if left foot not leading due to poor proprioception on left.    Personal Factors and Comorbidities  Comorbidity 3+    Examination-Activity Limitations  Locomotion Level;Transfers;Reach Overhead;Carry;Squat;Stairs;Lift;Stand     Examination-Participation Restrictions  Meal Prep;Cleaning;Community Activity;Driving;Laundry;Shop    Stability/Clinical Decision Making  Evolving/Moderate complexity    Rehab Potential  Good    PT Frequency  2x / week    PT Duration  8 weeks    PT Treatment/Interventions  Passive range of motion;Iontophoresis 4mg /ml Dexamethasone;Ultrasound;Electrical Stimulation;Cryotherapy;Manual techniques;Therapeutic exercise;Therapeutic activities;Stair training;Gait training;Balance training;Neuromuscular re-education;Moist Heat;Patient/family education    PT Next Visit Plan  Continue with left LE strengthening focusing on hip strenthening, weight shifting to left activities, dynamic gait and balance.    PT Home Exercise Plan  Access Code: [email or web address]    Consulted and Agree with Plan of Care  Patient       Patient will benefit from skilled therapeutic intervention in order to improve the following deficits and impairments:  Abnormal gait, Decreased activity tolerance, Decreased balance, Decreased coordination, Decreased endurance, Decreased mobility, Decreased range of motion, Decreased strength, Difficulty walking, Impaired UE functional use, Postural dysfunction, Improper body mechanics  Visit Diagnosis: Muscle weakness (generalized)  Other abnormalities of gait and mobility     Problem List Patient Active Problem List   Diagnosis Date Noted  . 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 Anticoagulated on warfarin   . Left hemiparesis ([place])   . History of CVA with residual deficit   . Thrombocytopenia ([place])   . Prediabetes   . CVA (cerebral vascular accident) ([place]) 09/03/2019  . Vertigo 09/24/2017  . Benign paroxysmal positional vertigo   . History of stroke   . Hyperlipemia 08/23/2017  . Encounter for therapeutic drug monitoring 08/08/2017  . Long term (current) use of anticoagulants [Z79.01] 07/12/2017  . Hemiparesis and alteration of sensations as late effects of stroke ([place]) 09/07/2016   . Abnormality of gait 09/07/2016  . HTN (hypertension) 04/13/2016  . H/O mitral valve replacement with mechanical valve 10/16/2015  . Diabetes mellitus without complication ([place])   . Hemiparesis (L sided - mild) due to old stroke ([place])     Electa Sniff, PT, DPT, NCS 12/24/2019, 12:18 PM  [facility] [address] [place], [place], [address] Phone: [phone]   Fax:  [phone]  Name: AMONIE QUEBEDEAUX MRN: [id number] Date of Birth: 1945-11-29

## 2019-12-27 ENCOUNTER — Ambulatory Visit: Payer: Medicare Other

## 2019-12-31 ENCOUNTER — Ambulatory Visit: Payer: Medicare Other

## 2019-12-31 ENCOUNTER — Ambulatory Visit (INDEPENDENT_AMBULATORY_CARE_PROVIDER_SITE_OTHER): Payer: Medicare Other | Admitting: *Deleted

## 2019-12-31 ENCOUNTER — Other Ambulatory Visit: Payer: Self-pay

## 2019-12-31 DIAGNOSIS — M6281 Muscle weakness (generalized): Secondary | ICD-10-CM

## 2019-12-31 DIAGNOSIS — Z5181 Encounter for therapeutic drug level monitoring: Secondary | ICD-10-CM | POA: Diagnosis not present

## 2019-12-31 DIAGNOSIS — R2689 Other abnormalities of gait and mobility: Secondary | ICD-10-CM

## 2019-12-31 DIAGNOSIS — Z952 Presence of prosthetic heart valve: Secondary | ICD-10-CM | POA: Diagnosis not present

## 2019-12-31 DIAGNOSIS — I69359 Hemiplegia and hemiparesis following cerebral infarction affecting unspecified side: Secondary | ICD-10-CM | POA: Diagnosis not present

## 2019-12-31 DIAGNOSIS — Z7901 Long term (current) use of anticoagulants: Secondary | ICD-10-CM

## 2019-12-31 DIAGNOSIS — I69354 Hemiplegia and hemiparesis following cerebral infarction affecting left non-dominant side: Secondary | ICD-10-CM | POA: Diagnosis not present

## 2019-12-31 LAB — POCT INR: INR: 2.9 (ref 2.0–3.0)

## 2019-12-31 NOTE — Therapy (Signed)
[facility] [address] [place], [place], [address] Phone: [phone]   Fax:  [phone]  Physical Therapy Treatment  Patient Details  Name: [name] MRN: [id number] Date of Birth: Jan 16, 1946 Referring Provider (PT): [name], MD   Encounter Date: 12/31/2019  PT End of Session - 12/31/19 1022    Visit Number  23    Number of Visits  33    Date for PT Re-Evaluation  02/09/20    Authorization Type  MCR    PT Start Time  1019    PT Stop Time  1057    PT Time Calculation (min)  38 min    Equipment Utilized During Treatment  Gait belt    Activity Tolerance  Patient tolerated treatment well;No increased pain    Behavior During Therapy  WFL for tasks assessed/performed       Past Medical History:  Diagnosis Date  . Abnormality of gait 09/07/2016  . Allergy   . Diabetes mellitus without complication [facility])    Patient denies this - notes history of glucose intolerance  . Hemiparesis and alteration of sensations as late effects of stroke ([place]) 09/07/2016  . History of pneumonia 1997  . S/P MVR (mitral valve replacement)    Mechanical mitral valve replacement at age 34 (done in [place])  // echo 7/17: EF 55-60, normal wall motion, bileaflet mechanical mitral valve prosthesis functioning normally, mild LAE, mildly reduced RVSF, small pericardial effusion  . Stroke [facility]) 1997, 2013, 2015    Past Surgical History:  Procedure Laterality Date  . ABDOMINAL HYSTERECTOMY    . BRAIN SURGERY    . BUNIONECTOMY  1993  . CARDIAC VALVE REPLACEMENT  1997  . TOE SURGERY  1996    There were no vitals filed for this visit.  Subjective Assessment - 12/31/19 1022    Subjective  Pt reports she is doing well.    Patient is accompained by:  Family member   spouse   How long can you sit comfortably?  1-2 min    Diagnostic tests  MRI showed old hemorrhagic infarct in the right parietal cortical and subcortical brain  which had progressed atrophy, encephalomalacia.  Suspicion of minor acute extension along the anterior margin of the old infarction affecting the deep white matter and small focus adjacent cortical brain.    Currently in Pain?  No/denies    Pain Onset  More than a month ago                       [facility] Adult PT Treatment/Exercise - 12/31/19 1023      Ambulation/Gait   Ambulation/Gait  Yes    Ambulation/Gait Assistance  6: Modified independent (Device/Increase time)    Ambulation/Gait Assistance Details  Verbal cues to increase step length.    Ambulation Distance (Feet)  230 Feet    Assistive device  Straight cane   left AFO   Gait Pattern  Step-through pattern;Decreased step length - right    Ambulation Surface  Level;Indoor    Stairs  Yes    Stairs Assistance  6: Modified independent (Device/Increase time)    Stair Management Technique  One rail Right;Alternating pattern    Number of Stairs  12      Standardized Balance Assessment   Standardized Balance Assessment  Timed Up and Go Test      Timed Up and Go Test   TUG  Normal TUG    Normal  TUG (seconds)  14.85   15.62 and 14.09 with SPC     Neuro Re-ed    Neuro Re-ed Details   In // bars: standing on rockerboard trying to maintain level  x 30 sec then trying to weight shift side to side x 10. Pt had to touch occasionally as had trouble controlling at end range. Pt also started to get some burning in left hip. PT added in some manual pertubation x 30 sec. Pt would over correct. Pt performed gait activities with SPC: gait with head turns on command 200', marching gait 14' with verbal cues to control SLS time. Pt reports that looking up was the most challenging.             PT Education - 12/31/19 1302    Education Details  Pt to continue with current HEP    Person(s) Educated  Patient    Methods  Explanation    Comprehension  Verbalized understanding       PT Short Term Goals - 12/31/19 1303      PT  SHORT TERM GOAL #1   Title  Pt will ambulate up/down flight of steps with 1 rail mod I for improved mobility and community access.    Time  4    Period  Weeks    Status  Achieved    Target Date  01/02/20      PT SHORT TERM GOAL #2   Title  Pt will decrease TUG from 18. 6 sec to <16 sec for improved balance and functional mobility.    Baseline  12/03/19 TUG=18.6 sec, TUG=14.85 sec on 01/02/20    Time  4    Period  Weeks    Status  Achieved    Target Date  01/02/20        PT Long Term Goals - 12/03/19 1155      PT LONG TERM GOAL #1   Title  Patient to be independent with advanced HEP.    Baseline  PT continues to progress HEP    Time  8    Period  Weeks    Status  On-going    Target Date  02/09/20      PT LONG TERM GOAL #2   Title  Pt to ambulate >500' varied surfaces with SPC mod I for improved community mobility.    Time  8    Period  Weeks    Status  Revised    Target Date  02/09/20      PT LONG TERM GOAL #3   Title  Patient to score <20 sec on 5xSTS without UE support to decrease risk of falls and improved functional strength.    Baseline  24 sec on 5x sit to stand    Time  8    Period  Weeks    Status  On-going    Target Date  02/09/20      PT LONG TERM GOAL #4   Title  Patient to improve gait speed to 2.62 ft/sec with [facility] for improved community mobility.    Baseline  12/03/19 gait speed=2.68f/ sec    Time  8    Period  Weeks    Status  Revised    Target Date  02/09/20            Plan - 12/31/19 1304    Clinical Impression Statement  Pt's STGs were reassessed today and patient met 2/2. She decreased her TUG score showing decreasing fall risk. Pt  was able to negotiate steps mod I today with 1 rail. Pt continues to benefit from skilled PT to work on strength, balance and gait.    Personal Factors and Comorbidities  Comorbidity 3+    Examination-Activity Limitations  Locomotion Level;Transfers;Reach Overhead;Carry;Squat;Stairs;Lift;Stand     Examination-Participation Restrictions  Meal Prep;Cleaning;Community Activity;Driving;Laundry;Shop    Stability/Clinical Decision Making  Evolving/Moderate complexity    Rehab Potential  Good    PT Frequency  2x / week    PT Duration  8 weeks    PT Treatment/Interventions  Passive range of motion;Iontophoresis 19m/ml Dexamethasone;Ultrasound;Electrical Stimulation;Cryotherapy;Manual techniques;Therapeutic exercise;Therapeutic activities;Stair training;Gait training;Balance training;Neuromuscular re-education;Moist Heat;Patient/family education    PT Next Visit Plan  Continue with left LE strengthening focusing on hip strenthening, weight shifting to left activities, dynamic gait and balance.    PT Home Exercise Plan  Access Code: [email or web address]    Consulted and Agree with Plan of Care  Patient       Patient will benefit from skilled therapeutic intervention in order to improve the following deficits and impairments:  Abnormal gait, Decreased activity tolerance, Decreased balance, Decreased coordination, Decreased endurance, Decreased mobility, Decreased range of motion, Decreased strength, Difficulty walking, Impaired UE functional use, Postural dysfunction, Improper body mechanics  Visit Diagnosis: Muscle weakness (generalized)  Other abnormalities of gait and mobility     Problem List Patient Active Problem List   Diagnosis Date Noted  . Subcortical infarction ([place] 09/05/2019  . Anticoagulated on warfarin   . Left hemiparesis ([place]   . History of CVA with residual deficit   . Thrombocytopenia ([place]   . Prediabetes   . CVA (cerebral vascular accident) ([place] 09/03/2019  . Vertigo 09/24/2017  . Benign paroxysmal positional vertigo   . History of stroke   . Hyperlipemia 08/23/2017  . Encounter for therapeutic drug monitoring 08/08/2017  . Long term (current) use of anticoagulants [Z79.01] 07/12/2017  . Hemiparesis and alteration of sensations as late effects of stroke ([place] 09/07/2016   . Abnormality of gait 09/07/2016  . HTN (hypertension) 04/13/2016  . H/O mitral valve replacement with mechanical valve 10/16/2015  . Diabetes mellitus without complication ([place]   . Hemiparesis (L sided - mild) due to old stroke ([place]     EElecta Sniff PT, DPT, NCS 12/31/2019, 1:10 PM  [place] [address]Phone: [phone]  Fax:  [phone] Name: PELARIA OSIASMRN: [id number] of Birth: 707-20-1947

## 2019-12-31 NOTE — Patient Instructions (Signed)
Description   Spoke with pt advised to take 10mg  today, then resume same dosage 5mg  daily except 7.5mg  on Mondays and Fridays. Pt will continue greens with 2 cups of lettuce on Fridays. Does weekly checks due to insurance. Recheck in INR in 1 week.

## 2020-01-03 ENCOUNTER — Ambulatory Visit: Payer: Medicare Other | Admitting: Physical Therapy

## 2020-01-04 ENCOUNTER — Ambulatory Visit: Payer: Medicare Other | Admitting: Physical Therapy

## 2020-01-04 ENCOUNTER — Encounter: Payer: Self-pay | Admitting: Physical Therapy

## 2020-01-04 ENCOUNTER — Other Ambulatory Visit: Payer: Self-pay

## 2020-01-04 DIAGNOSIS — R2689 Other abnormalities of gait and mobility: Secondary | ICD-10-CM | POA: Diagnosis not present

## 2020-01-04 DIAGNOSIS — I69354 Hemiplegia and hemiparesis following cerebral infarction affecting left non-dominant side: Secondary | ICD-10-CM

## 2020-01-04 DIAGNOSIS — M6281 Muscle weakness (generalized): Secondary | ICD-10-CM | POA: Diagnosis not present

## 2020-01-04 NOTE — Therapy (Signed)
[facility] [address] [place], [place], [address] Phone: [phone]   Fax:  [phone]  Physical Therapy Treatment  Patient Details  Name: [name] MRN: [id number] Date of Birth: Mar 15, 1946 Referring Provider (PT): [name], MD   Encounter Date: 01/04/2020  PT End of Session - 01/04/20 1106    Visit Number  24    Number of Visits  33    Date for PT Re-Evaluation  02/09/20    Authorization Type  MCR    PT Start Time  1103    PT Stop Time  1145    PT Time Calculation (min)  42 min    Equipment Utilized During Treatment  Gait belt    Activity Tolerance  Patient tolerated treatment well;No increased pain    Behavior During Therapy  WFL for tasks assessed/performed       Past Medical History:  Diagnosis Date  . Abnormality of gait 09/07/2016  . Allergy   . Diabetes mellitus without complication [facility])    Patient denies this - notes history of glucose intolerance  . Hemiparesis and alteration of sensations as late effects of stroke ([place]) 09/07/2016  . History of pneumonia 1997  . S/P MVR (mitral valve replacement)    Mechanical mitral valve replacement at age 18 (done in [place])  // echo 7/17: EF 55-60, normal wall motion, bileaflet mechanical mitral valve prosthesis functioning normally, mild LAE, mildly reduced RVSF, small pericardial effusion  . Stroke [facility]) 1997, 2013, 2015    Past Surgical History:  Procedure Laterality Date  . ABDOMINAL HYSTERECTOMY    . BRAIN SURGERY    . BUNIONECTOMY  1993  . CARDIAC VALVE REPLACEMENT  1997  . TOE SURGERY  1996    There were no vitals filed for this visit.  Subjective Assessment - 01/04/20 1105    Subjective  No new complaints. Continues with hip pain, using [place] and Tylenol to manage pain.    Patient is accompained by:  Family member   spouse   How long can you sit comfortably?  1-2 min    Diagnostic tests  MRI showed old hemorrhagic  infarct in the right parietal cortical and subcortical brain which had progressed atrophy, encephalomalacia.  Suspicion of minor acute extension along the anterior margin of the old infarction affecting the deep white matter and small focus adjacent cortical brain.    Currently in Pain?  No/denies    Pain Score  0-No pain           [facility] Adult PT Treatment/Exercise - 01/04/20 1106      Transfers   Transfers  Sit to Stand;Stand to Sit    Sit to Stand  5: Supervision;With upper extremity assist;From bed;From chair/3-in-1    Stand to Sit  5: Supervision;With upper extremity assist;To bed;To chair/3-in-1      Ambulation/Gait   Ambulation/Gait  Yes    Ambulation/Gait Assistance  5: Supervision    Ambulation/Gait Assistance Details  facilition to increase stance time on left LE with cues for equal step length.     Ambulation Distance (Feet)  115 Feet   x2, 230 x1, plus around gym with session.    Assistive device  Straight cane;Other (Comment)   left AFO   Gait Pattern  Step-through pattern;Decreased step length - right    Ambulation Surface  Level;Indoor      Exercises   Exercises  Other Exercises    Other Exercises   Nustep UE/LE level  4.0 for 6 minutes with goal >/=40 steps per minute for strengthening and activity tolerance          Balance Exercises - 01/04/20 1135      Balance Exercises: Standing   Standing Eyes Opened  Wide (BOA);Narrow base of support (BOS);Head turns;Foam/compliant surface;Other reps (comment);30 secs;Limitations    Standing Eyes Opened Limitations  on airex in corner: feet close together for [facility] no head movements, progressing to feet hip width apart for EC head movements left<>right, then up<>down for 10 reps each. most symptoms occured with movements up<>down then resolved quickly with rest break. min guard to min assist for balance with cues on posture/weight shifitng needed to correct balance.     Wall Bumps  Hip;Eyes opened;Eyes closed;10  reps;Limitations    Wall Bumps Limitations  floor progressing to on airex: 10 reps on floor with EO, then EC with min guard assist, then on airex for 10 reps with EO, progressing to EC with min guard to min assist for balance. cues on posture and technique needed. occasional touch to chair in front for balance assist needed as well.           PT Short Term Goals - 12/31/19 1303      PT SHORT TERM GOAL #1   Title  Pt will ambulate up/down flight of steps with 1 rail mod I for improved mobility and community access.    Time  4    Period  Weeks    Status  Achieved    Target Date  01/02/20      PT SHORT TERM GOAL #2   Title  Pt will decrease TUG from 18. 6 sec to <16 sec for improved balance and functional mobility.    Baseline  12/03/19 TUG=18.6 sec, TUG=14.85 sec on 01/02/20    Time  4    Period  Weeks    Status  Achieved    Target Date  01/02/20        PT Long Term Goals - 12/03/19 1155      PT LONG TERM GOAL #1   Title  Patient to be independent with advanced HEP.    Baseline  PT continues to progress HEP    Time  8    Period  Weeks    Status  On-going    Target Date  02/09/20      PT LONG TERM GOAL #2   Title  Pt to ambulate >500' varied surfaces with SPC mod I for improved community mobility.    Time  8    Period  Weeks    Status  Revised    Target Date  02/09/20      PT LONG TERM GOAL #3   Title  Patient to score <20 sec on 5xSTS without UE support to decrease risk of falls and improved functional strength.    Baseline  24 sec on 5x sit to stand    Time  8    Period  Weeks    Status  On-going    Target Date  02/09/20      PT LONG TERM GOAL #4   Title  Patient to improve gait speed to 2.62 ft/sec with [facility] for improved community mobility.    Baseline  12/03/19 gait speed=2.77ft/ sec    Time  8    Period  Weeks    Status  Revised    Target Date  02/09/20  Plan - 01/04/20 1106    Clinical Impression Statement  Today's skilled session  continued to focus on strengthening, gait mechanics and balance reactions. Rest breaks taken due to fatigue. Pt with mild increase in hip pain with activities that also resolved with rest breaks. The pt is progressing toward goals and should benefit from continued PT to progress toward unmet goals.    Personal Factors and Comorbidities  Comorbidity 3+    Examination-Activity Limitations  Locomotion Level;Transfers;Reach Overhead;Carry;Squat;Stairs;Lift;Stand    Examination-Participation Restrictions  Meal Prep;Cleaning;Community Activity;Driving;Laundry;Shop    Stability/Clinical Decision Making  Evolving/Moderate complexity    Rehab Potential  Good    PT Frequency  2x / week    PT Duration  8 weeks    PT Treatment/Interventions  Passive range of motion;Iontophoresis 4mg /ml Dexamethasone;Ultrasound;Electrical Stimulation;Cryotherapy;Manual techniques;Therapeutic exercise;Therapeutic activities;Stair training;Gait training;Balance training;Neuromuscular re-education;Moist Heat;Patient/family education    PT Next Visit Plan  Assess some LTGs the week onf 01/30/2020 for required 30 day assessment per Medicare guidelines; Continue with left LE strengthening focusing on hip strenthening, weight shifting to left activities, dynamic gait and balance.    PT Home Exercise Plan  Access Code: [email or web address]    Consulted and Agree with Plan of Care  Patient       Patient will benefit from skilled therapeutic intervention in order to improve the following deficits and impairments:  Abnormal gait, Decreased activity tolerance, Decreased balance, Decreased coordination, Decreased endurance, Decreased mobility, Decreased range of motion, Decreased strength, Difficulty walking, Impaired UE functional use, Postural dysfunction, Improper body mechanics  Visit Diagnosis: Muscle weakness (generalized)  Other abnormalities of gait and mobility  Hemiplegia and hemiparesis following cerebral infarction affecting left  non-dominant side [facility])     Problem List Patient Active Problem List   Diagnosis Date Noted  . Subcortical infarction ([place]) 09/05/2019  . Anticoagulated on warfarin   . Left hemiparesis ([place])   . History of CVA with residual deficit   . Thrombocytopenia ([place])   . Prediabetes   . CVA (cerebral vascular accident) ([place]) 09/03/2019  . Vertigo 09/24/2017  . Benign paroxysmal positional vertigo   . History of stroke   . Hyperlipemia 08/23/2017  . Encounter for therapeutic drug monitoring 08/08/2017  . Long term (current) use of anticoagulants [Z79.01] 07/12/2017  . 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 Abnormality of gait 09/07/2016  . HTN (hypertension) 04/13/2016  . H/O mitral valve replacement with mechanical valve 10/16/2015  . Diabetes mellitus without complication ([place])   . Hemiparesis (L sided - mild) due to old stroke ([place])     Willow Ora, PTA, [facility] [address] [place], [place] [address] [phone] 01/04/20, 6:53 PM   Name: [name] MRN: [id number] Date of Birth: November 18, 1945

## 2020-01-07 ENCOUNTER — Ambulatory Visit: Payer: Medicare Other | Attending: Physical Medicine & Rehabilitation

## 2020-01-07 ENCOUNTER — Other Ambulatory Visit: Payer: Self-pay

## 2020-01-07 ENCOUNTER — Ambulatory Visit (INDEPENDENT_AMBULATORY_CARE_PROVIDER_SITE_OTHER): Payer: Medicare Other | Admitting: Pharmacist

## 2020-01-07 DIAGNOSIS — M6281 Muscle weakness (generalized): Secondary | ICD-10-CM

## 2020-01-07 DIAGNOSIS — I69359 Hemiplegia and hemiparesis following cerebral infarction affecting unspecified side: Secondary | ICD-10-CM | POA: Diagnosis not present

## 2020-01-07 DIAGNOSIS — Z7901 Long term (current) use of anticoagulants: Secondary | ICD-10-CM | POA: Diagnosis not present

## 2020-01-07 DIAGNOSIS — I69354 Hemiplegia and hemiparesis following cerebral infarction affecting left non-dominant side: Secondary | ICD-10-CM | POA: Diagnosis not present

## 2020-01-07 DIAGNOSIS — R2689 Other abnormalities of gait and mobility: Secondary | ICD-10-CM

## 2020-01-07 DIAGNOSIS — Z5181 Encounter for therapeutic drug level monitoring: Secondary | ICD-10-CM | POA: Diagnosis not present

## 2020-01-07 DIAGNOSIS — Z952 Presence of prosthetic heart valve: Secondary | ICD-10-CM | POA: Diagnosis not present

## 2020-01-07 LAB — POCT INR: INR: 3 (ref 2.0–3.0)

## 2020-01-07 NOTE — Therapy (Signed)
[facility] [address] [place], [place], [address] Phone: [phone]   Fax:  [phone]  Physical Therapy Treatment  Patient Details  Name: [name] MRN: [id number] Date of Birth: 03/24/1946 Referring Provider (PT): [name], MD   Encounter Date: 01/07/2020  PT End of Session - 01/07/20 0936    Visit Number  25    Number of Visits  33    Date for PT Re-Evaluation  02/09/20    Authorization Type  MCR    PT Start Time  0932    PT Stop Time  1012    PT Time Calculation (min)  40 min    Equipment Utilized During Treatment  Gait belt    Activity Tolerance  Patient tolerated treatment well;No increased pain    Behavior During Therapy  WFL for tasks assessed/performed       Past Medical History:  Diagnosis Date  . Abnormality of gait 09/07/2016  . Allergy   . Diabetes mellitus without complication [facility])    Patient denies this - notes history of glucose intolerance  . Hemiparesis and alteration of sensations as late effects of stroke ([place]) 09/07/2016  . History of pneumonia 1997  . S/P MVR (mitral valve replacement)    Mechanical mitral valve replacement at age 18 (done in [place])  // echo 7/17: EF 55-60, normal wall motion, bileaflet mechanical mitral valve prosthesis functioning normally, mild LAE, mildly reduced RVSF, small pericardial effusion  . Stroke [facility]) 1997, 2013, 2015    Past Surgical History:  Procedure Laterality Date  . ABDOMINAL HYSTERECTOMY    . BRAIN SURGERY    . BUNIONECTOMY  1993  . CARDIAC VALVE REPLACEMENT  1997  . TOE SURGERY  1996    There were no vitals filed for this visit.  Subjective Assessment - 01/07/20 0935    Subjective  Pt reports that her left hip has had some pain off and on over the weekend. The tylenol seems to wear off earlier.    Patient is accompained by:  Family member   spouse   How long can you sit comfortably?  1-2 min    Diagnostic tests  MRI  showed old hemorrhagic infarct in the right parietal cortical and subcortical brain which had progressed atrophy, encephalomalacia.  Suspicion of minor acute extension along the anterior margin of the old infarction affecting the deep white matter and small focus adjacent cortical brain.    Currently in Pain?  No/denies    Pain Score  0-No pain    Pain Location  Hip    Pain Orientation  Left                       [facility] Adult PT Treatment/Exercise - 01/07/20 0937      Ambulation/Gait   Ambulation/Gait  Yes    Ambulation/Gait Assistance  6: Modified independent (Device/Increase time)    Ambulation/Gait Assistance Details  Verbal cues to increase step length.    Ambulation Distance (Feet)  460 Feet    Assistive device  Straight cane   left AFO   Gait Pattern  Step-through pattern;Decreased step length - right;Decreased step length - left    Ambulation Surface  Level;Indoor    Stairs  Yes    Stairs Assistance  5: Supervision;6: Modified independent (Device/Increase time)    Stairs Assistance Details (indicate cue type and reason)  Verbal cues to keep left foot straighter with stepping down on step  Stair Management Technique  One rail Right;Alternating pattern    Number of Stairs  12      Neuro Re-ed    Neuro Re-ed Details   In // bars: reciprocal steps over stepping stones 6' x 4 with right UE support on bar then with SPC 6' x 4. More difficulty with SPC. Reciprocal steps over stones with tapping first with light right UE support on bar 6' x 6. At counter: standing on rockerboard positioned ant/post tying to maintain level 30 sec x 2 CGA, then light touch on counter weight shifting ant/post. Decreased weight shift posterior. Rockerboard positioned lateral trying to maintain balance x 30 sec CGA/min assist with verbal cues to shift left more.             PT Education - 01/07/20 1233    Education Details  Pt to continue with current HEP    Person(s) Educated  Patient     Methods  Explanation    Comprehension  Verbalized understanding       PT Short Term Goals - 12/31/19 1303      PT SHORT TERM GOAL #1   Title  Pt will ambulate up/down flight of steps with 1 rail mod I for improved mobility and community access.    Time  4    Period  Weeks    Status  Achieved    Target Date  01/02/20      PT SHORT TERM GOAL #2   Title  Pt will decrease TUG from 18. 6 sec to <16 sec for improved balance and functional mobility.    Baseline  12/03/19 TUG=18.6 sec, TUG=14.85 sec on 01/02/20    Time  4    Period  Weeks    Status  Achieved    Target Date  01/02/20        PT Long Term Goals - 12/03/19 1155      PT LONG TERM GOAL #1   Title  Patient to be independent with advanced HEP.    Baseline  PT continues to progress HEP    Time  8    Period  Weeks    Status  On-going    Target Date  02/09/20      PT LONG TERM GOAL #2   Title  Pt to ambulate >500' varied surfaces with SPC mod I for improved community mobility.    Time  8    Period  Weeks    Status  Revised    Target Date  02/09/20      PT LONG TERM GOAL #3   Title  Patient to score <20 sec on 5xSTS without UE support to decrease risk of falls and improved functional strength.    Baseline  24 sec on 5x sit to stand    Time  8    Period  Weeks    Status  On-going    Target Date  02/09/20      PT LONG TERM GOAL #4   Title  Patient to improve gait speed to 2.62 ft/sec with [facility] for improved community mobility.    Baseline  12/03/19 gait speed=2.66ft/ sec    Time  8    Period  Weeks    Status  Revised    Target Date  02/09/20            Plan - 01/07/20 1233    Clinical Impression Statement  Pt is challenged when activities force more time on left leg with decreased  stability. Compliant surfaces difficult due to sensation and proprioception deficits.    Personal Factors and Comorbidities  Comorbidity 3+    Examination-Activity Limitations  Locomotion Level;Transfers;Reach  Overhead;Carry;Squat;Stairs;Lift;Stand    Examination-Participation Restrictions  Meal Prep;Cleaning;Community Activity;Driving;Laundry;Shop    Stability/Clinical Decision Making  Evolving/Moderate complexity    Rehab Potential  Good    PT Frequency  2x / week    PT Duration  8 weeks    PT Treatment/Interventions  Passive range of motion;Iontophoresis 4mg /ml Dexamethasone;Ultrasound;Electrical Stimulation;Cryotherapy;Manual techniques;Therapeutic exercise;Therapeutic activities;Stair training;Gait training;Balance training;Neuromuscular re-education;Moist Heat;Patient/family education    PT Next Visit Plan  Assess some LTGs the week onf 01/30/2020 for required 30 day assessment per Medicare guidelines; Continue with left LE strengthening focusing on hip strenthening, weight shifting to left activities, dynamic gait and balance.    PT Home Exercise Plan  Access Code: [email or web address]    Consulted and Agree with Plan of Care  Patient       Patient will benefit from skilled therapeutic intervention in order to improve the following deficits and impairments:  Abnormal gait, Decreased activity tolerance, Decreased balance, Decreased coordination, Decreased endurance, Decreased mobility, Decreased range of motion, Decreased strength, Difficulty walking, Impaired UE functional use, Postural dysfunction, Improper body mechanics  Visit Diagnosis: Muscle weakness (generalized)  Other abnormalities of gait and mobility     Problem List Patient Active Problem List   Diagnosis Date Noted  . 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 Anticoagulated on warfarin   . Left hemiparesis ([place])   . History of CVA with residual deficit   . Thrombocytopenia ([place])   . Prediabetes   . CVA (cerebral vascular accident) ([place]) 09/03/2019  . Vertigo 09/24/2017  . Benign paroxysmal positional vertigo   . History of stroke   . Hyperlipemia 08/23/2017  . Encounter for therapeutic drug monitoring 08/08/2017  . Long term  (current) use of anticoagulants [Z79.01] 07/12/2017  . Hemiparesis and alteration of sensations as late effects of stroke ([place]) 09/07/2016  . Abnormality of gait 09/07/2016  . HTN (hypertension) 04/13/2016  . H/O mitral valve replacement with mechanical valve 10/16/2015  . Diabetes mellitus without complication ([place])   . Hemiparesis (L sided - mild) due to old stroke ([place])     Electa Sniff, PT, DPT, NCS 01/07/2020, 12:35 PM  [facility] [address] [place], [place], [address] Phone: [phone]   Fax:  [phone]  Name: [name] MRN: [id number] Date of Birth: 1945/11/14

## 2020-01-08 ENCOUNTER — Ambulatory Visit: Payer: Medicare Other | Admitting: Physical Therapy

## 2020-01-08 ENCOUNTER — Ambulatory Visit: Payer: Medicare Other | Attending: Internal Medicine

## 2020-01-08 DIAGNOSIS — Z23 Encounter for immunization: Secondary | ICD-10-CM | POA: Insufficient documentation

## 2020-01-08 NOTE — Progress Notes (Signed)
   Covid-19 Vaccination Clinic  Name:  [name]    MRN: [id number] DOB: 01-01-1946  01/08/2020  [name] was observed post Covid-19 immunization for 15 minutes without incident. She was provided with Vaccine Information Sheet and instruction to access the V-Safe system.   [name] was instructed to call 911 with any severe reactions post vaccine: Marland Kitchen Difficulty breathing  . Swelling of face and throat  . A fast heartbeat  . A bad rash all over body  . Dizziness and weakness   Immunizations Administered    Name Date Dose VIS Date Route   Pfizer COVID-19 Vaccine 01/08/2020 10:32 AM 0.3 mL 10/19/2019 Intramuscular   Manufacturer: [place]   Lot: [id number]   [place]: [id number]

## 2020-01-10 ENCOUNTER — Ambulatory Visit: Payer: Medicare Other | Admitting: Physical Therapy

## 2020-01-11 ENCOUNTER — Encounter: Payer: Self-pay | Admitting: Physical Therapy

## 2020-01-11 ENCOUNTER — Other Ambulatory Visit: Payer: Self-pay

## 2020-01-11 ENCOUNTER — Ambulatory Visit: Payer: Medicare Other | Admitting: Physical Therapy

## 2020-01-11 DIAGNOSIS — R2689 Other abnormalities of gait and mobility: Secondary | ICD-10-CM | POA: Diagnosis not present

## 2020-01-11 DIAGNOSIS — I69354 Hemiplegia and hemiparesis following cerebral infarction affecting left non-dominant side: Secondary | ICD-10-CM

## 2020-01-11 DIAGNOSIS — M6281 Muscle weakness (generalized): Secondary | ICD-10-CM | POA: Diagnosis not present

## 2020-01-11 NOTE — Therapy (Signed)
[facility] [address] [place], [place], [address] Phone: [phone]   Fax:  [phone]  Physical Therapy Treatment  Patient Details  Name: [name] MRN: [id number] Date of Birth: June 23, 1946 Referring Provider (PT): [name], MD   Encounter Date: 01/11/2020  PT End of Session - 01/11/20 1021    Visit Number  26    Number of Visits  33    Date for PT Re-Evaluation  02/09/20    Authorization Type  MCR    PT Start Time  1018    PT Stop Time  1100    PT Time Calculation (min)  42 min    Equipment Utilized During Treatment  Gait belt    Activity Tolerance  Patient tolerated treatment well;No increased pain    Behavior During Therapy  WFL for tasks assessed/performed       Past Medical History:  Diagnosis Date  . Abnormality of gait 09/07/2016  . Allergy   . Diabetes mellitus without complication [facility])    Patient denies this - notes history of glucose intolerance  . Hemiparesis and alteration of sensations as late effects of stroke ([place]) 09/07/2016  . History of pneumonia 1997  . S/P MVR (mitral valve replacement)    Mechanical mitral valve replacement at age 22 (done in [place])  // echo 7/17: EF 55-60, normal wall motion, bileaflet mechanical mitral valve prosthesis functioning normally, mild LAE, mildly reduced RVSF, small pericardial effusion  . Stroke [facility]) 1997, 2013, 2015    Past Surgical History:  Procedure Laterality Date  . ABDOMINAL HYSTERECTOMY    . BRAIN SURGERY    . BUNIONECTOMY  1993  . CARDIAC VALVE REPLACEMENT  1997  . TOE SURGERY  1996    There were no vitals filed for this visit.  Subjective Assessment - 01/11/20 1020    Subjective  No new complaints. No falls. Was very tired after last session.    Patient is accompained by:  Family member    How long can you sit comfortably?  1-2 min    Diagnostic tests  MRI showed old hemorrhagic infarct in the right parietal  cortical and subcortical brain which had progressed atrophy, encephalomalacia.  Suspicion of minor acute extension along the anterior margin of the old infarction affecting the deep white matter and small focus adjacent cortical brain.    Currently in Pain?  No/denies    Pain Score  0-No pain           [facility] Adult PT Treatment/Exercise - 01/11/20 1031      Transfers   Transfers  Sit to Stand;Stand to Sit    Sit to Stand  5: Supervision;With upper extremity assist;From bed;From chair/3-in-1    Stand to Sit  5: Supervision;With upper extremity assist;To bed;To chair/3-in-1      Ambulation/Gait   Ambulation/Gait  Yes    Ambulation/Gait Assistance  5: Supervision;6: Modified independent (Device/Increase time)    Ambulation/Gait Assistance Details  Mod I when walking her preferred way. supervison when working on Ambulance person. cues with faciliation at pelvis for increased weight shifing onto left LE, increased stance time and increased left step length on right side.     Ambulation Distance (Feet)  115 Feet   x1, 230 x1   Assistive device  Straight cane    Gait Pattern  Step-through pattern;Decreased step length - right;Decreased step length - left    Ambulation Surface  Level;Indoor  Balance Exercises - 01/11/20 1050      Balance Exercises: Standing   Standing Eyes Closed  Narrow base of support (BOS);Wide (BOA);Head turns;Foam/compliant surface;Other reps (comment);30 secs;Limitations    Standing Eyes Closed Limitations  on airex in corner with no UE support: feet together for EC  no head movements, then with feet wide apart for EC head movements left<>right, then up<>down. min to mod assist for balance. seated rest breaks needed to allow "pt to settle her head". denies any dizziness, just stated "it's fuzzy". this feeling resolved with rest breaks.     Rockerboard  Anterior/posterior;EO;EC;30 seconds;10 reps;Limitations    Rockerboard Limitations  holding the  board steady: alternating UE raises for 5 reps each side, progressing to bil UE raises for 5 reps. cues on posture/weight shifting with up to min assist for balance. with arms at side, holding the board steady for EC no head movements for 30 sec's x 3 reps with min to mod assist. cues on posture/weight shifting for balance.           PT Short Term Goals - 12/31/19 1303      PT SHORT TERM GOAL #1   Title  Pt will ambulate up/down flight of steps with 1 rail mod I for improved mobility and community access.    Time  4    Period  Weeks    Status  Achieved    Target Date  01/02/20      PT SHORT TERM GOAL #2   Title  Pt will decrease TUG from 18. 6 sec to <16 sec for improved balance and functional mobility.    Baseline  12/03/19 TUG=18.6 sec, TUG=14.85 sec on 01/02/20    Time  4    Period  Weeks    Status  Achieved    Target Date  01/02/20        PT Long Term Goals - 12/03/19 1155      PT LONG TERM GOAL #1   Title  Patient to be independent with advanced HEP.    Baseline  PT continues to progress HEP    Time  8    Period  Weeks    Status  On-going    Target Date  02/09/20      PT LONG TERM GOAL #2   Title  Pt to ambulate >500' varied surfaces with SPC mod I for improved community mobility.    Time  8    Period  Weeks    Status  Revised    Target Date  02/09/20      PT LONG TERM GOAL #3   Title  Patient to score <20 sec on 5xSTS without UE support to decrease risk of falls and improved functional strength.    Baseline  24 sec on 5x sit to stand    Time  8    Period  Weeks    Status  On-going    Target Date  02/09/20      PT LONG TERM GOAL #4   Title  Patient to improve gait speed to 2.62 ft/sec with [facility] for improved community mobility.    Baseline  12/03/19 gait speed=2.84ft/ sec    Time  8    Period  Weeks    Status  Revised    Target Date  02/09/20            Plan - 01/11/20 1024    Clinical Impression Statement  Today's skilled session continued to focus  on  gait mechanics with emphasis on increased left stance time and on balance reactions with rest breaks needed. The pt is progressing toward goals and should benefit from continued PT to progress toward unmet goals.    Personal Factors and Comorbidities  Comorbidity 3+    Examination-Activity Limitations  Locomotion Level;Transfers;Reach Overhead;Carry;Squat;Stairs;Lift;Stand    Examination-Participation Restrictions  Meal Prep;Cleaning;Community Activity;Driving;Laundry;Shop    Stability/Clinical Decision Making  Evolving/Moderate complexity    Rehab Potential  Good    PT Frequency  2x / week    PT Duration  8 weeks    PT Treatment/Interventions  Passive range of motion;Iontophoresis 4mg /ml Dexamethasone;Ultrasound;Electrical Stimulation;Cryotherapy;Manual techniques;Therapeutic exercise;Therapeutic activities;Stair training;Gait training;Balance training;Neuromuscular re-education;Moist Heat;Patient/family education    PT Next Visit Plan  Assess some LTGs the week onf 01/30/2020 for required 30 day assessment per Medicare guidelines; Continue with left LE strengthening focusing on hip strenthening, weight shifting to left activities, dynamic gait and balance.    PT Home Exercise Plan  Access Code: [email or web address]    Consulted and Agree with Plan of Care  Patient       Patient will benefit from skilled therapeutic intervention in order to improve the following deficits and impairments:  Abnormal gait, Decreased activity tolerance, Decreased balance, Decreased coordination, Decreased endurance, Decreased mobility, Decreased range of motion, Decreased strength, Difficulty walking, Impaired UE functional use, Postural dysfunction, Improper body mechanics  Visit Diagnosis: Muscle weakness (generalized)  Other abnormalities of gait and mobility  Hemiplegia and hemiparesis following cerebral infarction affecting left non-dominant side [facility])     Problem List Patient Active Problem List   Diagnosis  Date Noted  . 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 Anticoagulated on warfarin   . Left hemiparesis ([place])   . History of CVA with residual deficit   . Thrombocytopenia ([place])   . Prediabetes   . CVA (cerebral vascular accident) ([place]) 09/03/2019  . Vertigo 09/24/2017  . Benign paroxysmal positional vertigo   . History of stroke   . Hyperlipemia 08/23/2017  . Encounter for therapeutic drug monitoring 08/08/2017  . Long term (current) use of anticoagulants [Z79.01] 07/12/2017  . Hemiparesis and alteration of sensations as late effects of stroke ([place]) 09/07/2016  . Abnormality of gait 09/07/2016  . HTN (hypertension) 04/13/2016  . H/O mitral valve replacement with mechanical valve 10/16/2015  . Diabetes mellitus without complication ([place])   . Hemiparesis (L sided - mild) due to old stroke ([place])     Willow Ora, PTA, [facility] [address] [place], [place] [address] [phone] 01/11/20, 8:13 PM   Name: GERDA ARAKELYAN MRN: [id number] Date of Birth: [birth year]

## 2020-01-14 ENCOUNTER — Ambulatory Visit (INDEPENDENT_AMBULATORY_CARE_PROVIDER_SITE_OTHER): Payer: Medicare Other | Admitting: Internal Medicine

## 2020-01-14 DIAGNOSIS — Z7901 Long term (current) use of anticoagulants: Secondary | ICD-10-CM | POA: Diagnosis not present

## 2020-01-14 DIAGNOSIS — Z952 Presence of prosthetic heart valve: Secondary | ICD-10-CM | POA: Diagnosis not present

## 2020-01-14 DIAGNOSIS — Z5181 Encounter for therapeutic drug level monitoring: Secondary | ICD-10-CM | POA: Diagnosis not present

## 2020-01-14 LAB — POCT INR: INR: 3.2 — AB (ref 2.0–3.0)

## 2020-01-14 NOTE — Patient Instructions (Signed)
Description   Continue same dosage 5mg  daily except 7.5mg  on Mondays and Fridays. Pt will continue greens with 2 cups of lettuce on Fridays. Does weekly checks due to insurance. Recheck in INR in 1 week.

## 2020-01-15 ENCOUNTER — Encounter: Payer: Self-pay | Admitting: Physical Therapy

## 2020-01-15 ENCOUNTER — Other Ambulatory Visit: Payer: Self-pay

## 2020-01-15 ENCOUNTER — Ambulatory Visit: Payer: Medicare Other | Admitting: Physical Therapy

## 2020-01-15 DIAGNOSIS — I69354 Hemiplegia and hemiparesis following cerebral infarction affecting left non-dominant side: Secondary | ICD-10-CM | POA: Diagnosis not present

## 2020-01-15 DIAGNOSIS — M6281 Muscle weakness (generalized): Secondary | ICD-10-CM

## 2020-01-15 DIAGNOSIS — R2689 Other abnormalities of gait and mobility: Secondary | ICD-10-CM | POA: Diagnosis not present

## 2020-01-15 NOTE — Therapy (Signed)
[facility] [address] [place], [place], [address] Phone: [phone]   Fax:  [phone]  Physical Therapy Treatment  Patient Details  Name: [name] MRN: [id number] Date of Birth: July 05, 1946 Referring Provider (PT): [name], MD   Encounter Date: 01/15/2020  PT End of Session - 01/15/20 1022    Visit Number  27    Number of Visits  33    Date for PT Re-Evaluation  02/09/20    Authorization Type  MCR    PT Start Time  1018    PT Stop Time  1100    PT Time Calculation (min)  42 min    Equipment Utilized During Treatment  Gait belt    Activity Tolerance  Patient tolerated treatment well;No increased pain    Behavior During Therapy  WFL for tasks assessed/performed       Past Medical History:  Diagnosis Date  . Abnormality of gait 09/07/2016  . Allergy   . Diabetes mellitus without complication [facility])    Patient denies this - notes history of glucose intolerance  . Hemiparesis and alteration of sensations as late effects of stroke ([place]) 09/07/2016  . History of pneumonia 1997  . S/P MVR (mitral valve replacement)    Mechanical mitral valve replacement at age 39 (done in [place])  // echo 7/17: EF 55-60, normal wall motion, bileaflet mechanical mitral valve prosthesis functioning normally, mild LAE, mildly reduced RVSF, small pericardial effusion  . Stroke [facility]) 1997, 2013, 2015    Past Surgical History:  Procedure Laterality Date  . ABDOMINAL HYSTERECTOMY    . BRAIN SURGERY    . BUNIONECTOMY  1993  . CARDIAC VALVE REPLACEMENT  1997  . TOE SURGERY  1996    There were no vitals filed for this visit.  Subjective Assessment - 01/15/20 1021    Subjective  No new complaitns. No falls to report. Minimal pain in left hip, "about a half".    Patient is accompained by:  Family member    How long can you sit comfortably?  1-2 min    Diagnostic tests  MRI showed old hemorrhagic infarct in the right  parietal cortical and subcortical brain which had progressed atrophy, encephalomalacia.  Suspicion of minor acute extension along the anterior margin of the old infarction affecting the deep white matter and small focus adjacent cortical brain.    Currently in Pain?  Yes    Pain Score  --   .5   Pain Location  Hip    Pain Orientation  Left    Pain Descriptors / Indicators  Sore;Tender    Pain Type  Chronic pain    Pain Onset  More than a month ago    Pain Frequency  Intermittent    Aggravating Factors   standing/walking too long    Pain Relieving Factors  bengay, tylenoal, ice massage            [facility] Adult PT Treatment/Exercise - 01/15/20 1023      Transfers   Transfers  Sit to Stand;Stand to Sit    Sit to Stand  5: Supervision;With upper extremity assist;From bed;From chair/3-in-1    Stand to Sit  5: Supervision;With upper extremity assist;To bed;To chair/3-in-1      Ambulation/Gait   Ambulation/Gait  Yes    Ambulation/Gait Assistance  5: Supervision;6: Modified independent (Device/Increase time);4: Min guard    Ambulation/Gait Assistance Details  min guard assist with gait on outdoor unlevel surfaces, otherwise  supervision to mod I with gait. worked on gait through narrow spaces/around furniture indoors with continued emphasis on increased left stance time/weight shifting with gait.     Ambulation Distance (Feet)  500 Feet   x1, plus around gym with session   Assistive device  Straight cane    Gait Pattern  Step-through pattern;Decreased step length - right;Decreased step length - left    Ambulation Surface  Level;Indoor      Exercises   Exercises  Other Exercises      Knee/Hip Exercises: Aerobic   Nustep  LE's only on level 4.0 for 6 minutes with goal >/= 40 steps per minute for strengthening and activity tolerance      Knee/Hip Exercises: Machines for Strengthening   Cybex Leg Press  60# bil LE's for 10 reps, 30#'s left LE only for 10 reps. cues for slow,  controlled  movements and to not lock knees out.           Balance Exercises - 01/15/20 1045      Balance Exercises: Standing   Standing Eyes Closed  Narrow base of support (BOS);Wide (BOA);Head turns;Foam/compliant surface;Other reps (comment);30 secs;Limitations    Standing Eyes Closed Limitations  on airex in corner with no UE support: feet together for EC  no head movements, then with feet wide apart for EC head movements left<>right, then up<>down. min  assist for balance.     Partial Tandem Stance  Foam/compliant surface;2 reps;20 secs;Eyes closed;Limitations    Partial Tandem Stance Limitations  on airex with no UE support, min guard to min assist for balance          PT Short Term Goals - 12/31/19 1303      PT SHORT TERM GOAL #1   Title  Pt will ambulate up/down flight of steps with 1 rail mod I for improved mobility and community access.    Time  4    Period  Weeks    Status  Achieved    Target Date  01/02/20      PT SHORT TERM GOAL #2   Title  Pt will decrease TUG from 18. 6 sec to <16 sec for improved balance and functional mobility.    Baseline  12/03/19 TUG=18.6 sec, TUG=14.85 sec on 01/02/20    Time  4    Period  Weeks    Status  Achieved    Target Date  01/02/20        PT Long Term Goals - 12/03/19 1155      PT LONG TERM GOAL #1   Title  Patient to be independent with advanced HEP.    Baseline  PT continues to progress HEP    Time  8    Period  Weeks    Status  On-going    Target Date  02/09/20      PT LONG TERM GOAL #2   Title  Pt to ambulate >500' varied surfaces with SPC mod I for improved community mobility.    Time  8    Period  Weeks    Status  Revised    Target Date  02/09/20      PT LONG TERM GOAL #3   Title  Patient to score <20 sec on 5xSTS without UE support to decrease risk of falls and improved functional strength.    Baseline  24 sec on 5x sit to stand    Time  8    Period  Weeks    Status  On-going    Target Date  02/09/20      PT LONG  TERM GOAL #4   Title  Patient to improve gait speed to 2.62 ft/sec with [facility] for improved community mobility.    Baseline  12/03/19 gait speed=2.25ft/ sec    Time  8    Period  Weeks    Status  Revised    Target Date  02/09/20            Plan - 01/15/20 1023    Clinical Impression Statement  Today's skilled session continued to focus on LE strengthening, gait with cane and balance reactions. Short rest breaks taken through out session due to fatigue and increased sorenss in left LE, both of which improved with the rest break. The pt is making steady progress toward goals and should benefit from continued PT to progress toward unmet goals.    Personal Factors and Comorbidities  Comorbidity 3+    Examination-Activity Limitations  Locomotion Level;Transfers;Reach Overhead;Carry;Squat;Stairs;Lift;Stand    Examination-Participation Restrictions  Meal Prep;Cleaning;Community Activity;Driving;Laundry;Shop    Stability/Clinical Decision Making  Evolving/Moderate complexity    Rehab Potential  Good    PT Frequency  2x / week    PT Duration  8 weeks    PT Treatment/Interventions  Passive range of motion;Iontophoresis 4mg /ml Dexamethasone;Ultrasound;Electrical Stimulation;Cryotherapy;Manual techniques;Therapeutic exercise;Therapeutic activities;Stair training;Gait training;Balance training;Neuromuscular re-education;Moist Heat;Patient/family education    PT Next Visit Plan  Assess some LTGs the week of 01/30/2020 for required 30 day assessment per Medicare guidelines; Continue with left LE strengthening focusing on hip strenthening, weight shifting to left activities, dynamic gait and balance.    PT Home Exercise Plan  Access Code: [email or web address]    Consulted and Agree with Plan of Care  Patient       Patient will benefit from skilled therapeutic intervention in order to improve the following deficits and impairments:  Abnormal gait, Decreased activity tolerance, Decreased balance, Decreased coordination,  Decreased endurance, Decreased mobility, Decreased range of motion, Decreased strength, Difficulty walking, Impaired UE functional use, Postural dysfunction, Improper body mechanics  Visit Diagnosis: Muscle weakness (generalized)  Other abnormalities of gait and mobility  Hemiplegia and hemiparesis following cerebral infarction affecting left non-dominant side [facility])     Problem List Patient Active Problem List   Diagnosis Date Noted  . 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 Anticoagulated on warfarin   . Left hemiparesis ([place])   . History of CVA with residual deficit   . Thrombocytopenia ([place])   . Prediabetes   . CVA (cerebral vascular accident) ([place]) 09/03/2019  . Vertigo 09/24/2017  . Benign paroxysmal positional vertigo   . History of stroke   . Hyperlipemia 08/23/2017  . Encounter for therapeutic drug monitoring 08/08/2017  . Long term (current) use of anticoagulants [Z79.01] 07/12/2017  . Hemiparesis and alteration of sensations as late effects of stroke ([place]) 09/07/2016  . Abnormality of gait 09/07/2016  . HTN (hypertension) 04/13/2016  . H/O mitral valve replacement with mechanical valve 10/16/2015  . Diabetes mellitus without complication ([place])   . Hemiparesis (L sided - mild) due to old stroke ([place])     Willow Ora, PTA, [facility] [address], [place], [place] [address] [phone] 01/15/20, 11:36 AM   Name: [name] MRN: [id number] Date of Birth: [birth year]

## 2020-01-17 ENCOUNTER — Encounter: Payer: Self-pay | Admitting: Physical Therapy

## 2020-01-17 ENCOUNTER — Other Ambulatory Visit: Payer: Self-pay

## 2020-01-17 ENCOUNTER — Ambulatory Visit: Payer: Medicare Other | Admitting: Physical Therapy

## 2020-01-17 DIAGNOSIS — R2689 Other abnormalities of gait and mobility: Secondary | ICD-10-CM

## 2020-01-17 DIAGNOSIS — M6281 Muscle weakness (generalized): Secondary | ICD-10-CM

## 2020-01-17 DIAGNOSIS — I69354 Hemiplegia and hemiparesis following cerebral infarction affecting left non-dominant side: Secondary | ICD-10-CM | POA: Diagnosis not present

## 2020-01-18 NOTE — Therapy (Signed)
[facility] [address] [place], [place], [address] Phone: [phone]   Fax:  [phone]  Physical Therapy Treatment  Patient Details  Name: SEVEN CHALLA MRN: [id number] Date of Birth: 09-14-1946 Referring Provider (PT): [name], MD   Encounter Date: 01/17/2020  PT End of Session - 01/17/20 1021    Visit Number  28    Number of Visits  33    Date for PT Re-Evaluation  02/09/20    Authorization Type  MCR    PT Start Time  1017    PT Stop Time  1100    PT Time Calculation (min)  43 min    Equipment Utilized During Treatment  Gait belt    Activity Tolerance  Patient tolerated treatment well;No increased pain    Behavior During Therapy  WFL for tasks assessed/performed       Past Medical History:  Diagnosis Date  . Abnormality of gait 09/07/2016  . Allergy   . Diabetes mellitus without complication [facility])    Patient denies this - notes history of glucose intolerance  . Hemiparesis and alteration of sensations as late effects of stroke ([place]) 09/07/2016  . History of pneumonia 1997  . S/P MVR (mitral valve replacement)    Mechanical mitral valve replacement at age 74 (done in [place])  // echo 7/17: EF 55-60, normal wall motion, bileaflet mechanical mitral valve prosthesis functioning normally, mild LAE, mildly reduced RVSF, small pericardial effusion  . Stroke [facility]) 1997, 2013, 2015    Past Surgical History:  Procedure Laterality Date  . ABDOMINAL HYSTERECTOMY    . BRAIN SURGERY    . BUNIONECTOMY  1993  . CARDIAC VALVE REPLACEMENT  1997  . TOE SURGERY  1996    There were no vitals filed for this visit.  Subjective Assessment - 01/17/20 1019    Subjective  No new complaints. No falls to report. Minimal pain in left hip. Was sore/throbbing in the hip/thigh in the evening after last session, tylenol helped some "eventually".    Patient is accompained by:  Family member   spouse   How long can  you sit comfortably?  1-2 min    Diagnostic tests  MRI showed old hemorrhagic infarct in the right parietal cortical and subcortical brain which had progressed atrophy, encephalomalacia.  Suspicion of minor acute extension along the anterior margin of the old infarction affecting the deep white matter and small focus adjacent cortical brain.    Currently in Pain?  Yes    Pain Score  1     Pain Location  Hip    Pain Orientation  Left    Pain Descriptors / Indicators  Sore;Tender    Pain Type  Chronic pain    Pain Onset  More than a month ago    Pain Frequency  Intermittent    Aggravating Factors   standing/walking too long    Pain Relieving Factors  bengay, tylenoal, ice massage             [facility] Adult PT Treatment/Exercise - 01/17/20 1021      Transfers   Transfers  Sit to Stand;Stand to Sit    Sit to Stand  5: Supervision;With upper extremity assist;From bed;From chair/3-in-1    Stand to Sit  5: Supervision;With upper extremity assist;To bed;To chair/3-in-1      Ambulation/Gait   Ambulation/Gait  Yes    Ambulation/Gait Assistance  4: Min guard;5: Supervision    Ambulation/Gait Assistance Details  min guard assist on outdoor surfaces, no loss of balance noted, occasional scuffing of left foot noted at times. supervision (when fatigued)  to Mod I with cane on indoor level surfaces    Ambulation Distance (Feet)  500 Feet   x1, plus around gym with session   Assistive device  Straight cane    Gait Pattern  Step-through pattern;Decreased step length - right;Decreased step length - left    Ambulation Surface  Level;Indoor;Unlevel;Outdoor;Paved      Knee/Hip Exercises: Aerobic   Nustep  LE's only on level 4.0 for 6 minutes with goal >/= 40 steps per minute for strengthening and activity tolerance          Balance Exercises - 01/17/20 1042      Balance Exercises: Standing   Rockerboard  Anterior/posterior;Lateral;EO;EC;30 seconds;10 reps;Limitations    Rockerboard  Limitations  on board both ways with no UE support: holding steady for alternating UE raises x 10 each side, then bil UE raises for 10 reps, then EC no head movements for 30 sec's x 3 reps. up to min assist needed due to posterior balance loss preference with cues for posture and weight shifting to correct this.            PT Short Term Goals - 12/31/19 1303      PT SHORT TERM GOAL #1   Title  Pt will ambulate up/down flight of steps with 1 rail mod I for improved mobility and community access.    Time  4    Period  Weeks    Status  Achieved    Target Date  01/02/20      PT SHORT TERM GOAL #2   Title  Pt will decrease TUG from 18. 6 sec to <16 sec for improved balance and functional mobility.    Baseline  12/03/19 TUG=18.6 sec, TUG=14.85 sec on 01/02/20    Time  4    Period  Weeks    Status  Achieved    Target Date  01/02/20        PT Long Term Goals - 12/03/19 1155      PT LONG TERM GOAL #1   Title  Patient to be independent with advanced HEP.    Baseline  PT continues to progress HEP    Time  8    Period  Weeks    Status  On-going    Target Date  02/09/20      PT LONG TERM GOAL #2   Title  Pt to ambulate >500' varied surfaces with SPC mod I for improved community mobility.    Time  8    Period  Weeks    Status  Revised    Target Date  02/09/20      PT LONG TERM GOAL #3   Title  Patient to score <20 sec on 5xSTS without UE support to decrease risk of falls and improved functional strength.    Baseline  24 sec on 5x sit to stand    Time  8    Period  Weeks    Status  On-going    Target Date  02/09/20      PT LONG TERM GOAL #4   Title  Patient to improve gait speed to 2.62 ft/sec with [facility] for improved community mobility.    Baseline  12/03/19 gait speed=2.80ft/ sec    Time  8    Period  Weeks    Status  Revised    Target Date  02/09/20            Plan - 01/17/20 1021    Clinical Impression Statement  Today's skilled session continued to address LE  strengthening, gait on various surfaces and balance reactions. Up to min assist needed for balance at times when pt was challenged by compliant surfaces. Rest breaks taken as needed for fatigue. No other issues reported or noted. The pt is progressing toward goals and should benefit from continued PT to progress toward unmet goals.    Personal Factors and Comorbidities  Comorbidity 3+    Examination-Activity Limitations  Locomotion Level;Transfers;Reach Overhead;Carry;Squat;Stairs;Lift;Stand    Examination-Participation Restrictions  Meal Prep;Cleaning;Community Activity;Driving;Laundry;Shop    Stability/Clinical Decision Making  Evolving/Moderate complexity    Rehab Potential  Good    PT Frequency  2x / week    PT Duration  8 weeks    PT Treatment/Interventions  Passive range of motion;Iontophoresis 4mg /ml Dexamethasone;Ultrasound;Electrical Stimulation;Cryotherapy;Manual techniques;Therapeutic exercise;Therapeutic activities;Stair training;Gait training;Balance training;Neuromuscular re-education;Moist Heat;Patient/family education    PT Next Visit Plan  Assess some LTGs the week of 01/30/2020 for required 30 day assessment per Medicare guidelines; Continue with left LE strengthening focusing on hip strenthening, weight shifting to left activities, dynamic gait and balance.    PT Home Exercise Plan  Access Code: [email or web address]    Consulted and Agree with Plan of Care  Patient       Patient will benefit from skilled therapeutic intervention in order to improve the following deficits and impairments:  Abnormal gait, Decreased activity tolerance, Decreased balance, Decreased coordination, Decreased endurance, Decreased mobility, Decreased range of motion, Decreased strength, Difficulty walking, Impaired UE functional use, Postural dysfunction, Improper body mechanics  Visit Diagnosis: Muscle weakness (generalized)  Other abnormalities of gait and mobility  Hemiplegia and hemiparesis following cerebral  infarction affecting left non-dominant side [facility])     Problem List Patient Active Problem List   Diagnosis Date Noted  . 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 Anticoagulated on warfarin   . Left hemiparesis ([place])   . History of CVA with residual deficit   . Thrombocytopenia ([place])   . Prediabetes   . CVA (cerebral vascular accident) ([place]) 09/03/2019  . Vertigo 09/24/2017  . Benign paroxysmal positional vertigo   . History of stroke   . Hyperlipemia 08/23/2017  . Encounter for therapeutic drug monitoring 08/08/2017  . Long term (current) use of anticoagulants [Z79.01] 07/12/2017  . Hemiparesis and alteration of sensations as late effects of stroke ([place]) 09/07/2016  . Abnormality of gait 09/07/2016  . HTN (hypertension) 04/13/2016  . H/O mitral valve replacement with mechanical valve 10/16/2015  . Diabetes mellitus without complication ([place])   . Hemiparesis (L sided - mild) due to old stroke ([place])     Willow Ora, PTA, [facility] [address] [place], [place] [address] [phone] 01/18/20, 9:18 AM   Name: LADETRA TORRENS MRN: [id number] Date of Birth: Mar 17, 1946

## 2020-01-21 ENCOUNTER — Ambulatory Visit (INDEPENDENT_AMBULATORY_CARE_PROVIDER_SITE_OTHER): Payer: Medicare Other | Admitting: Pharmacist

## 2020-01-21 DIAGNOSIS — Z952 Presence of prosthetic heart valve: Secondary | ICD-10-CM

## 2020-01-21 DIAGNOSIS — Z7901 Long term (current) use of anticoagulants: Secondary | ICD-10-CM | POA: Diagnosis not present

## 2020-01-21 DIAGNOSIS — I69359 Hemiplegia and hemiparesis following cerebral infarction affecting unspecified side: Secondary | ICD-10-CM

## 2020-01-21 DIAGNOSIS — Z5181 Encounter for therapeutic drug level monitoring: Secondary | ICD-10-CM

## 2020-01-21 LAB — POCT INR: INR: 3.1 — AB (ref 2.0–3.0)

## 2020-01-22 ENCOUNTER — Ambulatory Visit: Payer: Medicare Other | Admitting: Physical Therapy

## 2020-01-22 ENCOUNTER — Encounter: Payer: Self-pay | Admitting: Physical Therapy

## 2020-01-22 ENCOUNTER — Other Ambulatory Visit: Payer: Self-pay

## 2020-01-22 DIAGNOSIS — R2689 Other abnormalities of gait and mobility: Secondary | ICD-10-CM

## 2020-01-22 DIAGNOSIS — I69354 Hemiplegia and hemiparesis following cerebral infarction affecting left non-dominant side: Secondary | ICD-10-CM | POA: Diagnosis not present

## 2020-01-22 DIAGNOSIS — M6281 Muscle weakness (generalized): Secondary | ICD-10-CM

## 2020-01-22 NOTE — Therapy (Signed)
[facility] [address] [place], [place], [address] Phone: [phone]   Fax:  [phone]  Physical Therapy Treatment  Patient Details  Name: [name] MRN: [id number] Date of Birth: 1946/10/05 Referring Provider (PT): [name], MD   Encounter Date: 01/22/2020  PT End of Session - 01/22/20 1022    Visit Number  [id number]    Number of Visits  33    Date for PT Re-Evaluation  02/09/20    Authorization Type  MCR    PT Start Time  1018    PT Stop Time  1100    PT Time Calculation (min)  42 min    Equipment Utilized During Treatment  Gait belt    Activity Tolerance  Patient tolerated treatment well;No increased pain    Behavior During Therapy  WFL for tasks assessed/performed       Past Medical History:  Diagnosis Date  . Abnormality of gait 09/07/2016  . Allergy   . Diabetes mellitus without complication [facility])    Patient denies this - notes history of glucose intolerance  . Hemiparesis and alteration of sensations as late effects of stroke ([place]) 09/07/2016  . History of pneumonia 1997  . S/P MVR (mitral valve replacement)    Mechanical mitral valve replacement at age 68 (done in [place])  // echo 7/17: EF 55-60, normal wall motion, bileaflet mechanical mitral valve prosthesis functioning normally, mild LAE, mildly reduced RVSF, small pericardial effusion  . Stroke [facility]) 1997, 2013, 2015    Past Surgical History:  Procedure Laterality Date  . ABDOMINAL HYSTERECTOMY    . BRAIN SURGERY    . BUNIONECTOMY  1993  . CARDIAC VALVE REPLACEMENT  1997  . TOE SURGERY  1996    There were no vitals filed for this visit.  Subjective Assessment - 01/22/20 1021    Subjective  No new complaints. No falls to report. Hip is "okay" today.    Patient is accompained by:  Family member   spouse   How long can you sit comfortably?  1-2 min    Diagnostic tests  MRI showed old hemorrhagic infarct in the right parietal  cortical and subcortical brain which had progressed atrophy, encephalomalacia.  Suspicion of minor acute extension along the anterior margin of the old infarction affecting the deep white matter and small focus adjacent cortical brain.    Currently in Pain?  No/denies    Pain Score  0-No pain            [facility] Adult PT Treatment/Exercise - 01/22/20 1022      Transfers   Transfers  Sit to Stand;Stand to Sit    Sit to Stand  5: Supervision;With upper extremity assist;From bed;From chair/3-in-1    Stand to Sit  5: Supervision;With upper extremity assist;To bed;To chair/3-in-1      Ambulation/Gait   Ambulation/Gait  Yes    Ambulation/Gait Assistance  4: Min guard;5: Supervision    Ambulation/Gait Assistance Details  gait around track working on scanning all directions and speed changes with up to min guard assist needed. otherwise gait around session at supervision level.     Ambulation Distance (Feet)  250 Feet   x1   Assistive device  Straight cane    Gait Pattern  Step-through pattern;Decreased step length - right;Decreased step length - left    Ambulation Surface  Level;Indoor      Knee/Hip Exercises: Aerobic   Nustep  LE's only on level 5.0 for 6  minutes with goal >/= 40 steps per minute for strengthening and activity tolerance      Knee/Hip Exercises: Machines for Strengthening   Cybex Leg Press  60# bil LE's for 2 sets of 10 reps, then 30#'s left LE only for 2 sets of  10 reps. cues for slow,  controlled movements and to not lock knees out.       Knee/Hip Exercises: Standing   Lateral Step Up  Left;1 set;10 reps;Hand Hold: 1;Step Height: 4";Limitations    Lateral Step Up Limitations  left foot on step with left UE support only- lifting right foot up into air/back down with min guard assist for balance    Forward Step Up  Left;1 set;10 reps;Hand Hold: 1;Step Height: 4";Limitations    Forward Step Up Limitations  left foot on step with left UE support only on bars, lifting right  foot up for march, back down for 10 reps.           Balance Exercises - 01/22/20 1135      Balance Exercises: Standing   Standing Eyes Closed  Narrow base of support (BOS);Wide (BOA);Head turns;Foam/compliant surface;Other reps (comment);30 secs;Limitations    Standing Eyes Closed Limitations  on airex with no UE support: feet together EC 30 sec's x 3 reps with no head movements, progressing to feet apart for EC head movements left<>right, up<>down for 10 reps each. min assist for balance due to posterior lean with cues on posture/weight shifting.           PT Short Term Goals - 12/31/19 1303      PT SHORT TERM GOAL #1   Title  Pt will ambulate up/down flight of steps with 1 rail mod I for improved mobility and community access.    Time  4    Period  Weeks    Status  Achieved    Target Date  01/02/20      PT SHORT TERM GOAL #2   Title  Pt will decrease TUG from 18. 6 sec to <16 sec for improved balance and functional mobility.    Baseline  12/03/19 TUG=18.6 sec, TUG=14.85 sec on 01/02/20    Time  4    Period  Weeks    Status  Achieved    Target Date  01/02/20        PT Long Term Goals - 12/03/19 1155      PT LONG TERM GOAL #1   Title  Patient to be independent with advanced HEP.    Baseline  PT continues to progress HEP    Time  8    Period  Weeks    Status  On-going    Target Date  02/09/20      PT LONG TERM GOAL #2   Title  Pt to ambulate >500' varied surfaces with SPC mod I for improved community mobility.    Time  8    Period  Weeks    Status  Revised    Target Date  02/09/20      PT LONG TERM GOAL #3   Title  Patient to score <20 sec on 5xSTS without UE support to decrease risk of falls and improved functional strength.    Baseline  24 sec on 5x sit to stand    Time  8    Period  Weeks    Status  On-going    Target Date  02/09/20      PT LONG TERM GOAL #4   Title  Patient to improve gait speed to 2.62 ft/sec with [facility] for improved community mobility.     Baseline  12/03/19 gait speed=2.11ft/ sec    Time  8    Period  Weeks    Status  Revised    Target Date  02/09/20            Plan - 01/22/20 1022    Clinical Impression Statement  Today's skilled session continued to focus on LE strengthening, dynamic gait with cane and balance reactions on compliant surfaces with rest breaks taken as needed due to fatigue. The pt is progressing toward goals and should benefit from continued PT to progress toward unmet goals.    Personal Factors and Comorbidities  Comorbidity 3+    Examination-Activity Limitations  Locomotion Level;Transfers;Reach Overhead;Carry;Squat;Stairs;Lift;Stand    Examination-Participation Restrictions  Meal Prep;Cleaning;Community Activity;Driving;Laundry;Shop    Stability/Clinical Decision Making  Evolving/Moderate complexity    Rehab Potential  Good    PT Frequency  2x / week    PT Duration  8 weeks    PT Treatment/Interventions  Passive range of motion;Iontophoresis 4mg /ml Dexamethasone;Ultrasound;Electrical Stimulation;Cryotherapy;Manual techniques;Therapeutic exercise;Therapeutic activities;Stair training;Gait training;Balance training;Neuromuscular re-education;Moist Heat;Patient/family education    PT Next Visit Plan  Assess some LTGs the week of 01/30/2020 for required 30 day assessment per Medicare guidelines; Continue with left LE strengthening focusing on hip strenthening, weight shifting to left activities, dynamic gait and balance.    PT Home Exercise Plan  Access Code: [email or web address]    Consulted and Agree with Plan of Care  Patient       Patient will benefit from skilled therapeutic intervention in order to improve the following deficits and impairments:  Abnormal gait, Decreased activity tolerance, Decreased balance, Decreased coordination, Decreased endurance, Decreased mobility, Decreased range of motion, Decreased strength, Difficulty walking, Impaired UE functional use, Postural dysfunction, Improper body  mechanics  Visit Diagnosis: Muscle weakness (generalized)  Other abnormalities of gait and mobility  Hemiplegia and hemiparesis following cerebral infarction affecting left non-dominant side [facility])     Problem List Patient Active Problem List   Diagnosis Date Noted  . 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 Anticoagulated on warfarin   . Left hemiparesis ([place])   . History of CVA with residual deficit   . Thrombocytopenia ([place])   . Prediabetes   . CVA (cerebral vascular accident) ([place]) 09/03/2019  . Vertigo 09/24/2017  . Benign paroxysmal positional vertigo   . History of stroke   . Hyperlipemia 08/23/2017  . Encounter for therapeutic drug monitoring 08/08/2017  . Long term (current) use of anticoagulants [Z79.01] 07/12/2017  . Hemiparesis and alteration of sensations as late effects of stroke ([place]) 09/07/2016  . Abnormality of gait 09/07/2016  . HTN (hypertension) 04/13/2016  . H/O mitral valve replacement with mechanical valve 10/16/2015  . Diabetes mellitus without complication ([place])   . Hemiparesis (L sided - mild) due to old stroke ([place])     Willow Ora, PTA, [facility] [address], [place], [place] [address] [phone] 01/22/20, 11:39 AM   Name: [name] MRN: [id number] Date of Birth: 02-Jul-1946

## 2020-01-24 ENCOUNTER — Ambulatory Visit: Payer: Medicare Other | Admitting: Physical Therapy

## 2020-01-28 ENCOUNTER — Ambulatory Visit (INDEPENDENT_AMBULATORY_CARE_PROVIDER_SITE_OTHER): Payer: Medicare Other | Admitting: Adult Health

## 2020-01-28 ENCOUNTER — Encounter: Payer: Self-pay | Admitting: Adult Health

## 2020-01-28 ENCOUNTER — Other Ambulatory Visit: Payer: Self-pay

## 2020-01-28 ENCOUNTER — Ambulatory Visit (INDEPENDENT_AMBULATORY_CARE_PROVIDER_SITE_OTHER): Payer: Medicare Other | Admitting: *Deleted

## 2020-01-28 VITALS — BP 118/70 | HR 90 | Temp 97.0°F | Ht 67.0 in | Wt 128.6 lb

## 2020-01-28 DIAGNOSIS — R269 Unspecified abnormalities of gait and mobility: Secondary | ICD-10-CM | POA: Diagnosis not present

## 2020-01-28 DIAGNOSIS — I10 Essential (primary) hypertension: Secondary | ICD-10-CM | POA: Diagnosis not present

## 2020-01-28 DIAGNOSIS — G8194 Hemiplegia, unspecified affecting left nondominant side: Secondary | ICD-10-CM

## 2020-01-28 DIAGNOSIS — I6930 Unspecified sequelae of cerebral infarction: Secondary | ICD-10-CM

## 2020-01-28 DIAGNOSIS — Z952 Presence of prosthetic heart valve: Secondary | ICD-10-CM | POA: Diagnosis not present

## 2020-01-28 DIAGNOSIS — I69359 Hemiplegia and hemiparesis following cerebral infarction affecting unspecified side: Secondary | ICD-10-CM | POA: Diagnosis not present

## 2020-01-28 DIAGNOSIS — I639 Cerebral infarction, unspecified: Secondary | ICD-10-CM

## 2020-01-28 DIAGNOSIS — E785 Hyperlipidemia, unspecified: Secondary | ICD-10-CM | POA: Diagnosis not present

## 2020-01-28 DIAGNOSIS — Z5181 Encounter for therapeutic drug level monitoring: Secondary | ICD-10-CM | POA: Diagnosis not present

## 2020-01-28 DIAGNOSIS — Z7901 Long term (current) use of anticoagulants: Secondary | ICD-10-CM

## 2020-01-28 LAB — POCT INR: INR: 2.9 (ref 2.0–3.0)

## 2020-01-28 NOTE — Progress Notes (Signed)
[facility] [address] [place]. [place] [address] [phone]       STROKE FOLLOW UP NOTE  [name] Date of Birth:  1946-08-19 Medical Record Number:  [id number]   Reason for Referral: stroke follow up    CHIEF COMPLAINT:  Chief Complaint  Patient presents with  . Follow-up    Stroke follow up room 9 pt in therapy family in waiting room pt has cane    HPI:  [name] is a 74 year old female who is being seen today, 01/28/2020, for stroke follow-up.  She has been stable from a stroke standpoint with stable chronic left hemiparesis and subjective lack of sensation LLE.  She continues to work with neuro rehab PT with ongoing benefit.  Continues to use a cane outdoors and use of AFO brace but ambulates without AD in her own home without falls.  Denies new or worsening stroke/TIA symptoms.  Continues on aspirin and warfarin without bleeding or bruising with recent INR level satisfactory at 3.1 with goal 3-3.5.  Continues on atorvastatin without myalgias.  Blood pressure today 118/70.  No concerns at this time.    History: Copied for reference purposes only Stroke admission 09/03/2019: [name] is a 74 y.o. female with history of multiple strokess s/p craniotomy in the past presented on 09/03/2019 with increasedhemiparesis of the left arm and leg.  Stroke work-up showed acute extension along the anterior margin of old infarct with unknown etiology possibly failure of collaterals and history of mechanical heart valve on long-term AC and INR suboptimal at 2.1. MRI does confirm acute extension along the anterior margin of old infarction right parietal cortical and subcortical brain. Per Dr. Leonie Man during admission,"it is possible she had a transient hypoperfusion event as a new embolic event to the same distribution as the previous stroke would be unlikely.. The strokes are relatively small, and given the high risk of mechanical heart valve, I think it  is reasonable to continue anticoagulation".  CTA head/neck mild arthrosclerosis but no evidence of stenosis.  2D echo EF of 55% with abnormal septal motion due to postop effect, LA severely dilated, RA mild dilation, small pericardial effusion and mild calcification/thickening of aortic valve.  Previously on aspirin and Coumadin and recommended continuation with INR goal 3-3.5.  HTN stable.  LDL 65 and recommended continuation of atorvastatin 20 mg daily.  Controlled DM with A1c 6.1.  Other stroke risk factors include advanced age, prior history of stroke, CAD and valve replacement.  She was discharged to [facility] for ongoing therapy needs.  Initial visit 10/22/2019 [name]: [name] is a 74 year old female who is being seen today for hospital follow-up.  She continues to have mild left hemiparesis but has been experiencing issues with left hip pain and therapy believes possibly left hip bursitis and plans on evaluation by physical medicine rehab Dr. Letta Pate this Friday, 10/26/2019.  She continues to work with PT/OT with ongoing improvement.  Continues on aspirin and Coumadin without bleeding or bruising.  INR previously stable with levels remaining within goal range but today 2.9 with adjustment to Coumadin doses and ongoing follow-up with Coumadin clinic.  Continues on atorvastatin without myalgias.  Glucose levels stable.  Blood pressure today 102/71.  No further concerns        ROS:   14 system review of systems performed and negative with exception of joint pain and left-sided weakness  PMH:  Past Medical History:  Diagnosis Date  . Abnormality of gait 09/07/2016  .  Allergy   . Diabetes mellitus without complication [facility])    Patient denies this - notes history of glucose intolerance  . Hemiparesis and alteration of sensations as late effects of stroke ([place]) 09/07/2016  . History of pneumonia 1997  . S/P MVR (mitral valve replacement)    Mechanical mitral valve replacement at age 73 (done in  [place])  // echo 7/17: EF 55-60, normal wall motion, bileaflet mechanical mitral valve prosthesis functioning normally, mild LAE, mildly reduced RVSF, small pericardial effusion  . Stroke [facility]) 1997, 2013, 2015    PSH:  Past Surgical History:  Procedure Laterality Date  . ABDOMINAL HYSTERECTOMY    . BRAIN SURGERY    . BUNIONECTOMY  1993  . CARDIAC VALVE REPLACEMENT  1997  . TOE SURGERY  1996    Social History:  Social History   Socioeconomic History  . Marital status: Married    Spouse name: Not on file  . Number of children: 2  . 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 Highest education level: Not on file  Occupational History  . Occupation: Retired  Tobacco Use  . Smoking status: Never Smoker  . Smokeless tobacco: Never Used  Substance and Sexual Activity  . Alcohol use: No    Alcohol/week: 0.0 standard drinks  . Drug use: No  . Sexual activity: Not on file    Comment: Married  Other Topics Concern  . Not on file  Social History Narrative   Lives at home w/ her husband   Right-handed   Caffeine: none   Social Determinants of Health   Financial Resource Strain:   . Difficulty of Paying Living Expenses:   Food Insecurity:   . Worried About Charity fundraiser in the Last Year:   . Arboriculturist in the Last Year:   Transportation Needs:   . Film/video editor (Medical):   Marland Kitchen Lack of Transportation (Non-Medical):   Physical Activity:   . Days of Exercise per Week:   . Minutes of Exercise per Session:   Stress:   . Feeling of Stress :   Social Connections:   . Frequency of Communication with Friends and Family:   . Frequency of Social Gatherings with Friends and Family:   . Attends Religious Services:   . Active Member of Clubs or Organizations:   . Attends Archivist Meetings:   Marland Kitchen Marital Status:   Intimate Partner Violence:   . Fear of Current or Ex-Partner:   . Emotionally Abused:   Marland Kitchen Physically Abused:   . Sexually Abused:      Family History:  Family History  Problem Relation Age of Onset  . Cancer Mother        Bone  . Heart disease Mother   . Hyperlipidemia Mother   . Hypertension Mother   . Stroke Father   . Hypertension Father   . Heart attack Neg Hx     Medications:   Current Outpatient Medications on File Prior to Visit  Medication Sig Dispense Refill  . acetaminophen (TYLENOL) 325 MG tablet Take 2 tablets (650 mg total) by mouth every 4 (four) hours as needed for mild pain (or temp > 37.5 C (99.5 F)).    Marland Kitchen amLODipine (NORVASC) 10 MG tablet Take 1 tablet (10 mg total) by mouth daily. 30 tablet 0  . aspirin EC 81 MG tablet Take 81 mg by mouth daily.     Marland Kitchen atorvastatin (LIPITOR) 20 MG tablet Take 1  tablet (20 mg total) by mouth daily. 30 tablet 0  . calcium carbonate 1250 MG capsule Take 1,200 mg by mouth at bedtime.     . cholecalciferol (VITAMIN D) 1000 UNITS tablet Take 2,000 Units daily by mouth.     . denosumab (PROLIA) 60 MG/ML SOLN injection Inject 60 mg into the skin every 6 (six) months. Administer in upper arm, thigh, or abdomen    . docusate sodium (COLACE) 100 MG capsule Take 100 mg at bedtime by mouth.     Marland Kitchen omeprazole (PRILOSEC) 20 MG capsule Take 20 mg by mouth daily.    Marland Kitchen Propylene Glycol-Glycerin (SOOTHE) 0.6-0.6 % SOLN Place 1 drop into both eyes 2 (two) times daily as needed (dry eyes).    . warfarin (COUMADIN) 5 MG tablet TAKE BY MOUTH AS DIRECTED BY THE COUMADIN CLINIC FOR 30 DAYS. 45 tablet 3   No current facility-administered medications on file prior to visit.    Allergies:   Allergies  Allergen Reactions  . Zoloft [Sertraline] Hives, Itching and Rash     Physical Exam  Vitals:   01/28/20 1317  BP: 118/70  Pulse: 90  Temp: (!) 97 F (36.1 C)  Weight: 128 lb 9.6 oz (58.3 kg)  Height: 5\' 7"  (1.702 m)   Body mass index is 20.14 kg/m. No exam data present   General: well developed, well nourished, very pleasant elderly Caucasian female, seated, in no evident  distress Head: head normocephalic and atraumatic.   Neck: supple with no carotid or supraclavicular bruits Cardiovascular: regular rate and rhythm, no murmurs; mechanical valve click Musculoskeletal: no deformity Skin:  no rash/petichiae Vascular:  Normal pulses all extremities   Neurologic Exam Mental Status: Awake and fully alert. Oriented to place and time. Recent and remote memory intact. Attention span, concentration and fund of knowledge appropriate. Mood and affect appropriate.  Cranial Nerves: Fundoscopic exam reveals sharp disc margins. Pupils equal, briskly reactive to light. Extraocular movements with horizontal nystagmus with 2-3 beat left gaze and mild continuous right gaze -chronic per patient.  Visual fields full to confrontation. Hearing intact. Facial sensation intact. Face, tongue, palate moves normally and symmetrically.  Motor: Normal bulk and tone.  Chronic left hemiparesis 4+/5 and left foot drop with use of AFO Sensory.: intact to touch , pinprick , position and vibratory sensation.  Coordination: Rapid alternating movements normal in all extremities except mildly decreased left hand. Finger-to-nose and heel-to-shin positive for apraxia on left side. Gait and Station: Arises from chair with mild difficulty. Stance is normal. Gait demonstrates  mild left-sided hemiplegic gait and use of cane Reflexes: 1+ and symmetric. Toes downgoing.        Diagnostic Data (Labs, Imaging, Testing)  CT HEAD WO CONTRAST 09/02/2019 IMPRESSION: Chronic changes without acute abnormality.  CT ANGIO HEAD W OR WO CONTRAST CT ANGIO NECK W OR WO CONTRAST 09/04/2019 IMPRESSION: 1. No emergent finding. 2. Mild for age atherosclerosis with no flow limiting stenosis or ulceration affecting major vessels.  MR BRAIN WO CONTRAST 09/03/2019 IMPRESSION: Old hemorrhagic infarction in the right parietal cortical and subcortical brain which has progressed to atrophy, encephalomalacia and  gliosis. Suspicion of a minor acute extension along the anterior margin of the old infarction affecting the deep white matter and a small focus adjacent cortical brain.  Extensive old ischemic changes elsewhere throughout the brain as outlined above.  Tiny stable meningioma along the left posterior falx without mass-effect upon the brain or apparent change since 2018.  ECHOCARDIOGRAM 09/04/2019 IMPRESSIONS  1. Left ventricular ejection fraction, by visual estimation, is 55 to 60%. The left ventricle has normal function. There is no left ventricular hypertrophy.  2. Abnormal septal motion consistent with post-operative status.  3. Global right ventricle has normal systolic function.The right ventricular size is normal. No increase in right ventricular wall thickness.  4. Left atrial size was severely dilated.  5. Right atrial size was mildly dilated.  6. Small pericardial effusion.  7. The pericardial effusion is circumferential.  8. The mitral valve has been repaired/replaced. No evidence of mitral valve regurgitation.  9. The tricuspid valve is normal in structure. Tricuspid valve regurgitation mild-moderate. 10. The aortic valve is normal in structure. Aortic valve regurgitation was not visualized by color flow Doppler. Mild aortic valve sclerosis without stenosis. 11. There is Mild calcification of the aortic valve. 12. There is Mild thickening of the aortic valve. 13. The pulmonic valve was normal in structure. Pulmonic valve regurgitation is trivial by color flow Doppler. 14. Normal pulmonary artery systolic pressure. 15. Mitral gradients are similar to the previous study.    ASSESSMENT: [name] is a 74 y.o. year old female presented with increased left hemiparesis on 09/03/2019 with stroke work-up showing acute extension along the anterior margin of old infarct secondary to unclear etiology with possible transient hypoperfusion. Vascular risk factors include multiple  prior strokes, HTN, HLD, mechanical heart valve on AC with INR goal 3-3.5, advanced age and CAD.  Documented history of DM but patient adequately declines with A1c level indicating prediabetes.  Recovered well from recent stroke with stable baseline left hemiparesis    PLAN:  1. Acute extension of prior stroke: Continue aspirin 81 mg daily and warfarin daily  and atorvastatin for secondary stroke prevention. Maintain strict control of hypertension with blood pressure goal below 130/90, diabetes with hemoglobin A1c goal below 6.5% and cholesterol with LDL cholesterol (bad cholesterol) goal below 70 mg/dL.  I also advised the patient to eat a healthy diet with plenty of whole grains, cereals, fruits and vegetables, exercise regularly with at least 30 minutes of continuous activity daily and maintain ideal body weight. 2. HTN: Advised to continue current treatment regimen. Advised to continue to monitor at home along with continued follow-up with PCP for management.  Discussion regarding avoidance of hypotension to ensure adequate perfusion 3. HLD: Advised to continue current treatment regimen along with continued follow-up with PCP for future prescribing and monitoring of lipid panel 4. Mechanical heart valve: Defer monitoring and management to cardiology 5. Chronic left hemiparesis: Continue participation in outpatient therapy with ongoing improvement of overall gait and balance.  Use of cane while outdoors and discussion regarding importance of fall prevention which patient verbalized understanding.    Follow-up in 6 months or call earlier if needed   I spent 23 minutes of face-to-face and non-face-to-face time with patient.  This included previsit chart review, lab review, study review, order entry, electronic health record documentation, patient education regarding importance of secondary stroke risk factor management and importance of fall prevention    Frann Rider, AGNP-BC  [facility] [address] [place], [place] [address]  Phone [phone] Fax [phone] Note: This document was prepared with digital dictation and possible smart phrase technology. Any transcriptional errors that result from this process are unintentional.

## 2020-01-28 NOTE — Patient Instructions (Addendum)
Your Plan:  Continue current treatment plan  Continue to work with outpatient therapies for ongoing improvement - continue use of cane while outside of the house    Follow up in 6 months or call earlier if needed      Thank you for coming to see [place] at [facility]. I hope we have been able to provide you high quality care today.  You may receive a patient satisfaction survey over the next few weeks. We would appreciate your feedback and comments so that we may continue to improve ourselves and the health of our patients.

## 2020-01-28 NOTE — Patient Instructions (Signed)
Description   Spoke with pt and instructed pt to take 10mg  today then continue same dosage 5mg  daily except 7.5mg  on Mondays and Fridays. Pt will continue greens with 2 cups of lettuce on Fridays. Does weekly checks due to insurance. Recheck in INR in 1 week.

## 2020-01-29 ENCOUNTER — Other Ambulatory Visit: Payer: Self-pay

## 2020-01-29 ENCOUNTER — Ambulatory Visit: Payer: Medicare Other | Admitting: Physical Therapy

## 2020-01-29 ENCOUNTER — Encounter: Payer: Self-pay | Admitting: Physical Therapy

## 2020-01-29 DIAGNOSIS — I69354 Hemiplegia and hemiparesis following cerebral infarction affecting left non-dominant side: Secondary | ICD-10-CM

## 2020-01-29 DIAGNOSIS — R2689 Other abnormalities of gait and mobility: Secondary | ICD-10-CM

## 2020-01-29 DIAGNOSIS — M6281 Muscle weakness (generalized): Secondary | ICD-10-CM

## 2020-01-29 NOTE — Therapy (Addendum)
[facility] [address] [place], [place], [address] Phone: [phone]   Fax:  [phone]  Physical Therapy Treatment  Patient Details  Name: [name] MRN: [id number] Date of Birth: [birth year] Referring Provider (PT): [name], MD   Encounter Date: 01/29/2020  PT End of Session - 01/29/20 1021    Visit Number  30    Number of Visits  33    Date for PT Re-Evaluation  02/09/20    Authorization Type  MCR    PT Start Time  1017    PT Stop Time  1100    PT Time Calculation (min)  43 min    Equipment Utilized During Treatment  Gait belt    Activity Tolerance  Patient tolerated treatment well;No increased pain    Behavior During Therapy  WFL for tasks assessed/performed       Past Medical History:  Diagnosis Date  . Abnormality of gait 09/07/2016  . Allergy   . Diabetes mellitus without complication [facility])    Patient denies this - notes history of glucose intolerance  . Hemiparesis and alteration of sensations as late effects of stroke ([place]) 09/07/2016  . History of pneumonia 1997  . S/P MVR (mitral valve replacement)    Mechanical mitral valve replacement at age 66 (done in [place])  // echo 7/17: EF 55-60, normal wall motion, bileaflet mechanical mitral valve prosthesis functioning normally, mild LAE, mildly reduced RVSF, small pericardial effusion  . Stroke [facility]) 1997, 2013, 2015    Past Surgical History:  Procedure Laterality Date  . ABDOMINAL HYSTERECTOMY    . BRAIN SURGERY    . BUNIONECTOMY  1993  . CARDIAC VALVE REPLACEMENT  1997  . TOE SURGERY  1996    There were no vitals filed for this visit.  Subjective Assessment - 01/29/20 1020    Subjective  No new complaints. No falls and hip is okay today. Does report having increased pain over weekend, unsure of why as she did not do anything different.    Patient is accompained by:  Family member   spouse   How long can you sit  comfortably?  1-2 min    Diagnostic tests  MRI showed old hemorrhagic infarct in the right parietal cortical and subcortical brain which had progressed atrophy, encephalomalacia.  Suspicion of minor acute extension along the anterior margin of the old infarction affecting the deep white matter and small focus adjacent cortical brain.    Currently in Pain?  No/denies    Pain Score  0-No pain           [facility] Adult PT Treatment/Exercise - 01/29/20 1023      Transfers   Transfers  Sit to Stand;Stand to Sit    Sit to Stand  5: Supervision;With upper extremity assist;From bed;From chair/3-in-1    Five time sit to stand comments   17.04 sec's no UE support from standard height mat    Stand to Sit  5: Supervision;With upper extremity assist;To bed;To chair/3-in-1      Ambulation/Gait   Ambulation/Gait  Yes    Ambulation/Gait Assistance  4: Min guard;5: Supervision    Ambulation/Gait Assistance Details  min guard assist with gait while scanning all directions randomly and working on speed changes, otherwise supervision    Ambulation Distance (Feet)  450 Feet   x1   Assistive device  Straight cane    Gait Pattern  Step-through pattern;Decreased step length - right;Decreased step length - left  Ambulation Surface  Level;Indoor    Gait velocity  13.37 sec's = 2.45 ft/sec with cane      High Level Balance   High Level Balance Activities  Negotitating around obstacles;Negotiating over obstacles    High Level Balance Comments  with cane: figure 8 around hoola hoops<>forward stepping over 3 bolsters of vaired heights- 3 laps, rest, then 3 more laps down/back with min guard to min assist. cues on correct technique/step length with figure 8's and sequencing with stepping over bolsers.           Balance Exercises - 01/29/20 1045      Balance Exercises: Standing   Standing Eyes Closed  Wide (BOA);Head turns;Foam/compliant surface;Other reps (comment);30 secs;Limitations    Standing Eyes  Closed Limitations  standing across red foam beam with feet hip width apart, no UE support: EC no head movements for 30 sec's x 3 reps, progressing to EC head movements left<>right, up<>down for 10 reps each. min guard to min assist for balance.      Balance Beam  standing across red beam: alternating fwd stepping to floor/back onto beam, then alternating bwd stepping to floor/back onto beam for 10 reps each, cues for increased step length/height to clear beam surface. min guard assist with single UE support on bars for balance.           PT Short Term Goals - 12/31/19 1303      PT SHORT TERM GOAL #1   Title  Pt will ambulate up/down flight of steps with 1 rail mod I for improved mobility and community access.    Time  4    Period  Weeks    Status  Achieved    Target Date  01/02/20      PT SHORT TERM GOAL #2   Title  Pt will decrease TUG from 18. 6 sec to <16 sec for improved balance and functional mobility.    Baseline  12/03/19 TUG=18.6 sec, TUG=14.85 sec on 01/02/20    Time  4    Period  Weeks    Status  Achieved    Target Date  01/02/20        PT Long Term Goals - 01/29/20 1859      PT LONG TERM GOAL #1   Title  Patient to be independent with advanced HEP.    Baseline  PT continues to progress HEP    Time  8    Period  Weeks    Status  On-going      PT LONG TERM GOAL #2   Title  Pt to ambulate >500' varied surfaces with SPC mod I for improved community mobility.    Time  8    Period  Weeks    Status  On-going      PT LONG TERM GOAL #3   Title  Patient to score <20 sec on 5xSTS without UE support to decrease risk of falls and improved functional strength.    Baseline  01/29/20: 17.04 sec's no UE support from standard height surface    Status  Achieved      PT LONG TERM GOAL #4   Title  Patient to improve gait speed to 2.62 ft/sec with [facility] for improved community mobility.    Baseline  01/29/20: 2.45 ft/sec, improved from last assessment with score of 2.01 ft/sec     Time  8    Period  Weeks    Status  On-going  Plan - 01/29/20 1021    Clinical Impression Statement  Today's skilled session addressed progress toward some LTGs with pt meeting her 5 time sit to stand goal with time of 17.04 sec's with no UE support/assist from standard height surface. Pt also imrpoved her gait speed to 2.45 ft/sec from 2.01 ft/sec, almost meeting the goal. The remainder of today's skilled session continued to focus on dynamic gait with cane and balance reactions with rest breaks taken as needed. The pt is progressing toward goals and should benefit from continued PT to progress toward unmet goals.    Personal Factors and Comorbidities  Comorbidity 3+    Examination-Activity Limitations  Locomotion Level;Transfers;Reach Overhead;Carry;Squat;Stairs;Lift;Stand    Examination-Participation Restrictions  Meal Prep;Cleaning;Community Activity;Driving;Laundry;Shop    Stability/Clinical Decision Making  Evolving/Moderate complexity    Rehab Potential  Good    PT Frequency  2x / week    PT Duration  8 weeks    PT Treatment/Interventions  Passive range of motion;Iontophoresis 4mg /ml Dexamethasone;Ultrasound;Electrical Stimulation;Cryotherapy;Manual techniques;Therapeutic exercise;Therapeutic activities;Stair training;Gait training;Balance training;Neuromuscular re-education;Moist Heat;Patient/family education    PT Next Visit Plan  Continue with left LE strengthening focusing on hip strenthening, weight shifting to left activities, dynamic gait and balance.    PT Home Exercise Plan  Access Code: [email or web address]    Consulted and Agree with Plan of Care  Patient       Patient will benefit from skilled therapeutic intervention in order to improve the following deficits and impairments:  Abnormal gait, Decreased activity tolerance, Decreased balance, Decreased coordination, Decreased endurance, Decreased mobility, Decreased range of motion, Decreased strength, Difficulty walking,  Impaired UE functional use, Postural dysfunction, Improper body mechanics  Visit Diagnosis: Muscle weakness (generalized)  Other abnormalities of gait and mobility  Hemiplegia and hemiparesis following cerebral infarction affecting left non-dominant side [facility])     Problem List Patient Active Problem List   Diagnosis Date Noted  . 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 Anticoagulated on warfarin   . Left hemiparesis ([place])   . History of CVA with residual deficit   . Thrombocytopenia ([place])   . Prediabetes   . CVA (cerebral vascular accident) ([place]) 09/03/2019  . Vertigo 09/24/2017  . Benign paroxysmal positional vertigo   . History of stroke   . Hyperlipemia 08/23/2017  . Encounter for therapeutic drug monitoring 08/08/2017  . Long term (current) use of anticoagulants [Z79.01] 07/12/2017  . Hemiparesis and alteration of sensations as late effects of stroke ([place]) 09/07/2016  . Abnormality of gait 09/07/2016  . HTN (hypertension) 04/13/2016  . H/O mitral valve replacement with mechanical valve 10/16/2015  . Diabetes mellitus without complication ([place])   . Hemiparesis (L sided - mild) due to old stroke [facility])      Progress Note  Reporting period 12/20/19 to 01/29/20  See Note above for Objective Data and Assessment of Progress/Goals   Willow Ora, PTA, [facility] [address], [place], [place] [address] [phone] 01/29/20, 7:18 PM    Cherly Anderson, PT, DPT, NCS   Name: [name] MRN: [id number] Date of Birth: 1946-01-31

## 2020-01-31 ENCOUNTER — Encounter: Payer: Self-pay | Admitting: Physical Therapy

## 2020-01-31 ENCOUNTER — Other Ambulatory Visit: Payer: Self-pay

## 2020-01-31 ENCOUNTER — Ambulatory Visit: Payer: Medicare Other | Admitting: Physical Therapy

## 2020-01-31 DIAGNOSIS — M6281 Muscle weakness (generalized): Secondary | ICD-10-CM | POA: Diagnosis not present

## 2020-01-31 DIAGNOSIS — R2689 Other abnormalities of gait and mobility: Secondary | ICD-10-CM | POA: Diagnosis not present

## 2020-01-31 DIAGNOSIS — I69354 Hemiplegia and hemiparesis following cerebral infarction affecting left non-dominant side: Secondary | ICD-10-CM | POA: Diagnosis not present

## 2020-01-31 NOTE — Therapy (Signed)
[facility] [address] [place], [place], [address] Phone: [phone]   Fax:  [phone]  Physical Therapy Treatment  Patient Details  Name: [name] MRN: [id number] Date of Birth: [birth year] Referring Provider (PT): [name], MD   Encounter Date: 01/31/2020  PT End of Session - 01/31/20 1020    Visit Number  31    Number of Visits  33    Date for PT Re-Evaluation  02/09/20    Authorization Type  MCR    Progress Note Due on Visit  40    PT Start Time  1017    PT Stop Time  1058    PT Time Calculation (min)  41 min    Equipment Utilized During Treatment  Gait belt    Activity Tolerance  Patient tolerated treatment well;No increased pain    Behavior During Therapy  WFL for tasks assessed/performed       Past Medical History:  Diagnosis Date  . Abnormality of gait 09/07/2016  . Allergy   . Diabetes mellitus without complication [facility])    Patient denies this - notes history of glucose intolerance  . Hemiparesis and alteration of sensations as late effects of stroke ([place]) 09/07/2016  . History of pneumonia 1997  . S/P MVR (mitral valve replacement)    Mechanical mitral valve replacement at age 34 (done in [place])  // echo 7/17: EF 55-60, normal wall motion, bileaflet mechanical mitral valve prosthesis functioning normally, mild LAE, mildly reduced RVSF, small pericardial effusion  . Stroke [facility]) 1997, 2013, 2015    Past Surgical History:  Procedure Laterality Date  . ABDOMINAL HYSTERECTOMY    . BRAIN SURGERY    . BUNIONECTOMY  1993  . CARDIAC VALVE REPLACEMENT  1997  . TOE SURGERY  1996    There were no vitals filed for this visit.  Subjective Assessment - 01/31/20 1019    Subjective  No new comlplaints. No falls. Was sick yesterday from something she ate, better today.    Patient is accompained by:  Family member   spouse   How long can you sit comfortably?  1-2 min    Diagnostic  tests  MRI showed old hemorrhagic infarct in the right parietal cortical and subcortical brain which had progressed atrophy, encephalomalacia.  Suspicion of minor acute extension along the anterior margin of the old infarction affecting the deep white matter and small focus adjacent cortical brain.    Currently in Pain?  No/denies    Pain Score  0-No pain           [facility] Adult PT Treatment/Exercise - 01/31/20 1022      Transfers   Transfers  Sit to Stand;Stand to Sit    Sit to Stand  5: Supervision;With upper extremity assist;From bed;From chair/3-in-1    Stand to Sit  5: Supervision;With upper extremity assist;To bed;To chair/3-in-1      Ambulation/Gait   Ambulation/Gait  Yes    Ambulation/Gait Assistance  5: Supervision    Ambulation/Gait Assistance Details  no balance issues noted with gait outdoors on paved surfaces.     Ambulation Distance (Feet)  500 Feet   x1, plus around gym with session   Assistive device  Straight cane    Gait Pattern  Step-through pattern;Decreased step length - right;Decreased step length - left    Ambulation Surface  Level;Unlevel;Indoor;Outdoor;Paved      High Level Balance   High Level Balance Activities  Negotitating around obstacles;Negotiating over  obstacles    High Level Balance Comments  with cane: figure 8 around hoola hoops<>forward stepping over 3 bolsters of vaired heights- 3 laps down/back with min guard assist. reminder cues on correct technique/step length with figure 8's and sequencing with stepping over bolsers.       Knee/Hip Exercises: Aerobic   Nustep  LE's only on level 5.0 for 6 minutes with goal >/= 40 steps per minute for strengthening and activity tolerance      Knee/Hip Exercises: Machines for Strengthening   Cybex Leg Press  65# bil LE's for 1 sets of 10 reps, then 35#'s left LE only for 1 sets of  10 reps. cues for slow,  controlled movements and to not lock knees out.           Balance Exercises - 01/31/20 1049       Balance Exercises: Standing   Standing Eyes Closed  Narrow base of support (BOS);Wide (BOA);Head turns;Foam/compliant surface;Other reps (comment);30 secs;Limitations    Standing Eyes Closed Limitations  standing on doubled 1 inch foam: narrow stance for EC no head movements, progressing to hip width apart stance for EC head movements left<>right, then up<down. up to min assist needed for balance with no UE support.           PT Short Term Goals - 12/31/19 1303      PT SHORT TERM GOAL #1   Title  Pt will ambulate up/down flight of steps with 1 rail mod I for improved mobility and community access.    Time  4    Period  Weeks    Status  Achieved    Target Date  01/02/20      PT SHORT TERM GOAL #2   Title  Pt will decrease TUG from 18. 6 sec to <16 sec for improved balance and functional mobility.    Baseline  12/03/19 TUG=18.6 sec, TUG=14.85 sec on 01/02/20    Time  4    Period  Weeks    Status  Achieved    Target Date  01/02/20        PT Long Term Goals - 01/31/20 1838      PT LONG TERM GOAL #1   Title  Patient to be independent with advanced HEP. (all LTGs due 02/09/2020)    Baseline  PT continues to progress HEP    Time  8    Period  Weeks    Status  On-going      PT LONG TERM GOAL #2   Title  Pt to ambulate >500' varied surfaces with SPC mod I for improved community mobility.    Time  8    Period  Weeks    Status  On-going      PT LONG TERM GOAL #3   Title  Patient to score <20 sec on 5xSTS without UE support to decrease risk of falls and improved functional strength.    Baseline  01/29/20: 17.04 sec's no UE support from standard height surface    Status  Achieved      PT LONG TERM GOAL #4   Title  Patient to improve gait speed to 2.62 ft/sec with [facility] for improved community mobility.    Baseline  01/29/20: 2.45 ft/sec, improved from last assessment with score of 2.01 ft/sec    Time  8    Period  Weeks    Status  On-going            Plan - 01/31/20 1020  Clinical Impression Statement  Today's skilled session continued to focus on LE strengthening. balance reactions and gait with cane. The pt is making steady progress toward goals and should benefit from continued PT to progress toward unmet goals.    Personal Factors and Comorbidities  Comorbidity 3+    Examination-Activity Limitations  Locomotion Level;Transfers;Reach Overhead;Carry;Squat;Stairs;Lift;Stand    Examination-Participation Restrictions  Meal Prep;Cleaning;Community Activity;Driving;Laundry;Shop    Stability/Clinical Decision Making  Evolving/Moderate complexity    Rehab Potential  Good    PT Frequency  2x / week    PT Duration  8 weeks    PT Treatment/Interventions  Passive range of motion;Iontophoresis 4mg /ml Dexamethasone;Ultrasound;Electrical Stimulation;Cryotherapy;Manual techniques;Therapeutic exercise;Therapeutic activities;Stair training;Gait training;Balance training;Neuromuscular re-education;Moist Heat;Patient/family education    PT Next Visit Plan  Continue with left LE strengthening focusing on hip strenthening, weight shifting to left activities, dynamic gait and balance.    PT Home Exercise Plan  Access Code: [email or web address]    Consulted and Agree with Plan of Care  Patient       Patient will benefit from skilled therapeutic intervention in order to improve the following deficits and impairments:  Abnormal gait, Decreased activity tolerance, Decreased balance, Decreased coordination, Decreased endurance, Decreased mobility, Decreased range of motion, Decreased strength, Difficulty walking, Impaired UE functional use, Postural dysfunction, Improper body mechanics  Visit Diagnosis: Muscle weakness (generalized)  Other abnormalities of gait and mobility     Problem List Patient Active Problem List   Diagnosis Date Noted  . 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 Anticoagulated on warfarin   . Left hemiparesis ([place])   . History of CVA with residual deficit   .  Thrombocytopenia ([place])   . Prediabetes   . CVA (cerebral vascular accident) ([place]) 09/03/2019  . Vertigo 09/24/2017  . Benign paroxysmal positional vertigo   . History of stroke   . Hyperlipemia 08/23/2017  . Encounter for therapeutic drug monitoring 08/08/2017  . Long term (current) use of anticoagulants [Z79.01] 07/12/2017  . Hemiparesis and alteration of sensations as late effects of stroke ([place]) 09/07/2016  . Abnormality of gait 09/07/2016  . HTN (hypertension) 04/13/2016  . H/O mitral valve replacement with mechanical valve 10/16/2015  . Diabetes mellitus without complication ([place])   . Hemiparesis (L sided - mild) due to old stroke ([place])    Willow Ora, PTA, [facility] [address], [place], [place] [address] [phone] 01/31/20, 6:39 PM   Name: AMIYLAH WEIDEMAN MRN: [id number] Date of Birth: July 08, 1946

## 2020-02-04 ENCOUNTER — Ambulatory Visit (INDEPENDENT_AMBULATORY_CARE_PROVIDER_SITE_OTHER): Payer: Medicare Other | Admitting: Internal Medicine

## 2020-02-04 DIAGNOSIS — Z952 Presence of prosthetic heart valve: Secondary | ICD-10-CM | POA: Diagnosis not present

## 2020-02-04 DIAGNOSIS — I69359 Hemiplegia and hemiparesis following cerebral infarction affecting unspecified side: Secondary | ICD-10-CM

## 2020-02-04 DIAGNOSIS — Z7901 Long term (current) use of anticoagulants: Secondary | ICD-10-CM | POA: Diagnosis not present

## 2020-02-04 DIAGNOSIS — Z5181 Encounter for therapeutic drug level monitoring: Secondary | ICD-10-CM

## 2020-02-04 LAB — POCT INR: INR: 3.7 — AB (ref 2.0–3.0)

## 2020-02-04 NOTE — Patient Instructions (Signed)
Description   Spoke with pt and instructed pt to take 5mg  today then continue same dosage 5mg  daily except 7.5mg  on Mondays and Fridays. Pt will continue greens with 2 cups of lettuce on Fridays. Does weekly checks due to insurance. Recheck in INR in 1 week.

## 2020-02-05 ENCOUNTER — Ambulatory Visit: Payer: Medicare Other

## 2020-02-05 ENCOUNTER — Other Ambulatory Visit: Payer: Self-pay

## 2020-02-05 DIAGNOSIS — M6281 Muscle weakness (generalized): Secondary | ICD-10-CM | POA: Diagnosis not present

## 2020-02-05 DIAGNOSIS — I69354 Hemiplegia and hemiparesis following cerebral infarction affecting left non-dominant side: Secondary | ICD-10-CM | POA: Diagnosis not present

## 2020-02-05 DIAGNOSIS — R2689 Other abnormalities of gait and mobility: Secondary | ICD-10-CM

## 2020-02-05 NOTE — Therapy (Signed)
[facility] [address] [place], [place], [address] Phone: [phone]   Fax:  [phone]  Physical Therapy Treatment  Patient Details  Name: [name] MRN: [id number] Date of Birth: January 18, 1946 Referring Provider (PT): [name], MD   Encounter Date: 02/05/2020  PT End of Session - 02/05/20 1018    Visit Number  32    Number of Visits  33    Date for PT Re-Evaluation  02/09/20    Authorization Type  MCR    Progress Note Due on Visit  52    PT Start Time  [id number]    PT Stop Time  1101    PT Time Calculation (min)  46 min    Equipment Utilized During Treatment  Gait belt    Activity Tolerance  Patient tolerated treatment well;No increased pain    Behavior During Therapy  WFL for tasks assessed/performed       Past Medical History:  Diagnosis Date  . Abnormality of gait 09/07/2016  . Allergy   . Diabetes mellitus without complication [facility])    Patient denies this - notes history of glucose intolerance  . 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 History of pneumonia 1997  . S/P MVR (mitral valve replacement)    Mechanical mitral valve replacement at age 37 (done in [place])  // echo 7/17: EF 55-60, normal wall motion, bileaflet mechanical mitral valve prosthesis functioning normally, mild LAE, mildly reduced RVSF, small pericardial effusion  . Stroke [facility]) 1997, 2013, 2015    Past Surgical History:  Procedure Laterality Date  . ABDOMINAL HYSTERECTOMY    . BRAIN SURGERY    . BUNIONECTOMY  1993  . CARDIAC VALVE REPLACEMENT  1997  . TOE SURGERY  1996    There were no vitals filed for this visit.  Subjective Assessment - 02/05/20 1019    Subjective  Pt reports that she is doing well. Has been doing her exercises every morning for 45 min to an hour. She reports that she feels pain more at times when she stops when she is walking but not during.  She sees  Dr. Letta Pate on Thursday.    Patient is accompained by:  Family member   spouse   How long can you sit comfortably?  1-2 min    Diagnostic tests  MRI showed old hemorrhagic infarct in the right parietal cortical and subcortical brain which had progressed atrophy, encephalomalacia.  Suspicion of minor acute extension along the anterior margin of the old infarction affecting the deep white matter and small focus adjacent cortical brain.    Currently in Pain?  No/denies                       [facility] Adult PT Treatment/Exercise - 02/05/20 1022      Transfers   Transfers  Sit to Stand;Stand to Sit    Sit to Stand  6: Modified independent (Device/Increase time)    Stand to Sit  6: Modified independent (Device/Increase time)      Ambulation/Gait   Ambulation/Gait  Yes    Ambulation/Gait Assistance  5: Supervision;6: Modified independent (Device/Increase time)    Ambulation/Gait Assistance Details  Pt needed close supervision on grass and verbal cues to increase left foot clearance.    Ambulation Distance (Feet)  800 Feet    Assistive device  Straight cane    Gait Pattern  Step-through pattern;Decreased step length -  right;Decreased hip/knee flexion - left    Ambulation Surface  Level;Unlevel;Outdoor;Paved;Grass    Curb  5: Supervision    Curb Details (indicate cue type and reason)  Performed x 3 with CGA at times with [facility]    Gait Comments  Pt stepped over curb block x 2 with close SBA.      Neuro Re-ed    Neuro Re-ed Details   Gait in // bars walking over blue mat without UE support 6' x 4 then sidestepping 6' x 4, marching forward and than backwards gait 6' x 6 with verbal cues to increase posterior step length on right. Pt reported some discomfort in left glut area. Step-ups on 4" step x 5 with 1 UE each leg.             PT Education - 02/05/20 1745    Education Details  Discussed planned discharge next visit.    Person(s) Educated  Patient;Spouse    Methods   Explanation    Comprehension  Verbalized understanding       PT Short Term Goals - 12/31/19 1303      PT SHORT TERM GOAL #1   Title  Pt will ambulate up/down flight of steps with 1 rail mod I for improved mobility and community access.    Time  4    Period  Weeks    Status  Achieved    Target Date  01/02/20      PT SHORT TERM GOAL #2   Title  Pt will decrease TUG from 18. 6 sec to <16 sec for improved balance and functional mobility.    Baseline  12/03/19 TUG=18.6 sec, TUG=14.85 sec on 01/02/20    Time  4    Period  Weeks    Status  Achieved    Target Date  01/02/20        PT Long Term Goals - 02/05/20 1022      PT LONG TERM GOAL #1   Title  Patient to be independent with advanced HEP. (all LTGs due 02/09/2020)    Baseline  Pt has been doing her exercises daily as instructed.    Time  8    Period  Weeks    Status  Achieved      PT LONG TERM GOAL #2   Title  Pt to ambulate >500' varied surfaces with SPC mod I for improved community mobility.    Time  8    Period  Weeks    Status  On-going      PT LONG TERM GOAL #3   Title  Patient to score <20 sec on 5xSTS without UE support to decrease risk of falls and improved functional strength.    Baseline  01/29/20: 17.04 sec's no UE support from standard height surface    Status  Achieved      PT LONG TERM GOAL #4   Title  Patient to improve gait speed to 2.62 ft/sec with [facility] for improved community mobility.    Baseline  01/29/20: 2.45 ft/sec, improved from last assessment with score of 2.01 ft/sec    Time  8    Period  Weeks    Status  On-going            Plan - 02/05/20 1200    Clinical Impression Statement  PT continued to require supervision on nonlevel surfaces for safety. Pt did well with longer gait distance outside.    Personal Factors and Comorbidities  Comorbidity 3+  Examination-Activity Limitations  Locomotion Level;Transfers;Reach Overhead;Carry;Squat;Stairs;Lift;Stand    Examination-Participation  Restrictions  Meal Prep;Cleaning;Community Activity;Driving;Laundry;Shop    Stability/Clinical Decision Making  Evolving/Moderate complexity    Rehab Potential  Good    PT Frequency  2x / week    PT Duration  8 weeks    PT Treatment/Interventions  Passive range of motion;Iontophoresis 4mg /ml Dexamethasone;Ultrasound;Electrical Stimulation;Cryotherapy;Manual techniques;Therapeutic exercise;Therapeutic activities;Stair training;Gait training;Balance training;Neuromuscular re-education;Moist Heat;Patient/family education    PT Next Visit Plan  Plan to discharge next visit. Check remaining goals.    PT Home Exercise Plan  Access Code: [email or web address]    Consulted and Agree with Plan of Care  Patient       Patient will benefit from skilled therapeutic intervention in order to improve the following deficits and impairments:  Abnormal gait, Decreased activity tolerance, Decreased balance, Decreased coordination, Decreased endurance, Decreased mobility, Decreased range of motion, Decreased strength, Difficulty walking, Impaired UE functional use, Postural dysfunction, Improper body mechanics  Visit Diagnosis: Muscle weakness (generalized)  Other abnormalities of gait and mobility     Problem List Patient Active Problem List   Diagnosis Date Noted  . 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 Anticoagulated on warfarin   . Left hemiparesis ([place])   . History of CVA with residual deficit   . Thrombocytopenia ([employer])   . Prediabetes   . CVA (cerebral vascular accident) ([place]) 09/03/2019  . Vertigo 09/24/2017  . Benign paroxysmal positional vertigo   . History of stroke   . Hyperlipemia 08/23/2017  . Encounter for therapeutic drug monitoring 08/08/2017  . Long term (current) use of anticoagulants [Z79.01] 07/12/2017  . Hemiparesis and alteration of sensations as late effects of stroke ([place]) 09/07/2016  . Abnormality of gait 09/07/2016  . HTN (hypertension) 04/13/2016  . H/O mitral valve replacement  with mechanical valve 10/16/2015  . Diabetes mellitus without complication ([place])   . Hemiparesis (L sided - mild) due to old stroke ([place])     Electa Sniff, PT, DPT, NCS 02/05/2020, 5:52 PM  [facility] [address] [place], [place], [address] Phone: [phone]   Fax:  [phone]  Name: [name] MRN: [id number] Date of Birth: [birth year]

## 2020-02-07 ENCOUNTER — Encounter: Payer: Medicare Other | Attending: Registered Nurse | Admitting: Physical Medicine & Rehabilitation

## 2020-02-07 ENCOUNTER — Encounter: Payer: Self-pay | Admitting: Physical Medicine & Rehabilitation

## 2020-02-07 ENCOUNTER — Ambulatory Visit: Payer: Medicare Other | Attending: Physical Medicine & Rehabilitation

## 2020-02-07 ENCOUNTER — Other Ambulatory Visit: Payer: Self-pay

## 2020-02-07 VITALS — BP 111/71 | HR 80 | Temp 97.5°F | Ht 67.0 in | Wt 127.2 lb

## 2020-02-07 DIAGNOSIS — R2689 Other abnormalities of gait and mobility: Secondary | ICD-10-CM

## 2020-02-07 DIAGNOSIS — I6930 Unspecified sequelae of cerebral infarction: Secondary | ICD-10-CM | POA: Diagnosis not present

## 2020-02-07 DIAGNOSIS — I10 Essential (primary) hypertension: Secondary | ICD-10-CM | POA: Insufficient documentation

## 2020-02-07 DIAGNOSIS — M545 Low back pain: Secondary | ICD-10-CM | POA: Insufficient documentation

## 2020-02-07 DIAGNOSIS — G8194 Hemiplegia, unspecified affecting left nondominant side: Secondary | ICD-10-CM | POA: Diagnosis not present

## 2020-02-07 DIAGNOSIS — I639 Cerebral infarction, unspecified: Secondary | ICD-10-CM | POA: Insufficient documentation

## 2020-02-07 DIAGNOSIS — M6281 Muscle weakness (generalized): Secondary | ICD-10-CM | POA: Insufficient documentation

## 2020-02-07 NOTE — Patient Instructions (Signed)
Please have driver eval results sent to me

## 2020-02-07 NOTE — Therapy (Signed)
[facility] [address] [place], [place], [address] Phone: [phone]   Fax:  [phone]  Physical Therapy Treatment/discharge summary  Patient Details  Name: [name] MRN: [id number] Date of Birth: 12-Nov-1945 Referring Provider (PT): Kirsteins, Luanna Salk, MD   PHYSICAL THERAPY DISCHARGE SUMMARY  Visits from Start of Care: 60  Current functional level related to goals / functional outcomes: See clinical impression and goals for more information. Pt is ambulating with SPC mod I on level surfaces and supervision on nonlevel.   Remaining deficits: Left hemiparesis   Education / Equipment: HEP  Plan: Patient agrees to discharge.  Patient goals were met. Patient is being discharged due to being pleased with the current functional level.  ?????       Encounter Date: 02/07/2020  PT End of Session - 02/07/20 1017    Visit Number  33    Number of Visits  33    Date for PT Re-Evaluation  02/09/20    Authorization Type  MCR    Progress Note Due on Visit  45    PT Start Time  7893    PT Stop Time  1058    PT Time Calculation (min)  43 min    Equipment Utilized During Treatment  Gait belt    Activity Tolerance  Patient tolerated treatment well;No increased pain    Behavior During Therapy  WFL for tasks assessed/performed       Past Medical History:  Diagnosis Date  . Abnormality of gait 09/07/2016  . Allergy   . Diabetes mellitus without complication [facility])    Patient denies this - notes history of glucose intolerance  . 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 History of pneumonia 1997  . S/P MVR (mitral valve replacement)    Mechanical mitral valve replacement at age 78 (done in [place])  // echo 7/17: EF 55-60, normal wall motion, bileaflet mechanical mitral valve prosthesis functioning normally, mild LAE, mildly reduced RVSF, small pericardial effusion  .  Stroke [facility]) 1997, 2013, 2015    Past Surgical History:  Procedure Laterality Date  . ABDOMINAL HYSTERECTOMY    . BRAIN SURGERY    . BUNIONECTOMY  1993  . CARDIAC VALVE REPLACEMENT  1997  . TOE SURGERY  1996    There were no vitals filed for this visit.  Subjective Assessment - 02/07/20 1016    Subjective  Pt reports that she is doing well. Misplaced her driving instructor info and wondering about getting another copy.    Patient is accompained by:  Family member   spouse   How long can you sit comfortably?  1-2 min    Diagnostic tests  MRI showed old hemorrhagic infarct in the right parietal cortical and subcortical brain which had progressed atrophy, encephalomalacia.  Suspicion of minor acute extension along the anterior margin of the old infarction affecting the deep white matter and small focus adjacent cortical brain.    Currently in Pain?  Yes    Pain Location  Hip    Pain Orientation  Left    Pain Descriptors / Indicators  Sore    Pain Type  Chronic pain                       [facility] Adult PT Treatment/Exercise - 02/07/20 1039      Ambulation/Gait   Ambulation/Gait  Yes    Ambulation/Gait Assistance  5: Supervision  Ambulation/Gait Assistance Details  Pt was given cues to try to increase left step length on nonlevel surfaces    Ambulation Distance (Feet)  250 Feet    Assistive device  Straight cane    Gait Pattern  Step-through pattern;Decreased step length - left    Ambulation Surface  Level;Unlevel;Outdoor    Gait velocity  17.97 comfortable 1.82 ft/ sec and 14.64 fast= 2.59f/ sec fast      Self-Care   Self-Care  Other Self-Care Comments    Other Self-Care Comments   Gave patient driver training instructions per her request.      Neuro Re-ed    Neuro Re-ed Details   Staggered stance at counter with light UE support x 30 sec each position. Standing in corner eyes closed x 30 sec      Exercises   Exercises  Other Exercises    Other Exercises    PT reviewed HEP discussing each exercise with patient and she verbalized understanding in following HEP from [place]             PT Education - 02/07/20 1330    Education Details  Discharge plan for today as well as reviewed HEP.    Person(s) Educated  Patient    Methods  Explanation    Comprehension  Verbalized understanding       PT Short Term Goals - 12/31/19 1303      PT SHORT TERM GOAL #1   Title  Pt will ambulate up/down flight of steps with 1 rail mod I for improved mobility and community access.    Time  4    Period  Weeks    Status  Achieved    Target Date  01/02/20      PT SHORT TERM GOAL #2   Title  Pt will decrease TUG from 18. 6 sec to <16 sec for improved balance and functional mobility.    Baseline  12/03/19 TUG=18.6 sec, TUG=14.85 sec on 01/02/20    Time  4    Period  Weeks    Status  Achieved    Target Date  01/02/20        PT Long Term Goals - 02/07/20 1331      PT LONG TERM GOAL #1   Title  Patient to be independent with advanced HEP. (all LTGs due 02/09/2020)    Baseline  Pt has been doing her exercises daily as instructed.    Time  8    Period  Weeks    Status  Achieved      PT LONG TERM GOAL #2   Title  Pt to ambulate >500' varied surfaces with SPC mod I for improved community mobility.    Baseline  still supervision on nonlevel surfaces    Time  8    Period  Weeks    Status  Not Met      PT LONG TERM GOAL #3   Title  Patient to score <20 sec on 5xSTS without UE support to decrease risk of falls and improved functional strength.    Baseline  01/29/20: 17.04 sec's no UE support from standard height surface    Status  Achieved      PT LONG TERM GOAL #4   Title  Patient to improve gait speed to 2.62 ft/[facility].    Baseline  01/29/20: 2.45 ft/sec, improved from last assessment with score of 2.01 ft/sec    Time  8    Period  Weeks    Status  Not Met            Plan - 02/07/20 1331    Clinical  Impression Statement  Pt has shown good progress throughout therapy with improving safety with gait requiring less assistance. She met all goals except still requiring supervision on nonlevel surfaces with SPC and gait speed has not further increased. Husband is always with her when they leave the house and provides assist as needed. Pt feels good with continuing with current HEP on own. PT discharging today as planned.    Personal Factors and Comorbidities  Comorbidity 3+    Examination-Activity Limitations  Locomotion Level;Transfers;Reach Overhead;Carry;Squat;Stairs;Lift;Stand    Examination-Participation Restrictions  Meal Prep;Cleaning;Community Activity;Driving;Laundry;Shop    Stability/Clinical Decision Making  Evolving/Moderate complexity    Rehab Potential  Good    PT Frequency  2x / week    PT Duration  8 weeks    PT Treatment/Interventions  Passive range of motion;Iontophoresis 38m/ml Dexamethasone;Ultrasound;Electrical Stimulation;Cryotherapy;Manual techniques;Therapeutic exercise;Therapeutic activities;Stair training;Gait training;Balance training;Neuromuscular re-education;Moist Heat;Patient/family education    PT Next Visit Plan  Discharge today    PT Home Exercise Plan  Access Code: [email or web address]    Consulted and Agree with Plan of Care  Patient       Patient will benefit from skilled therapeutic intervention in order to improve the following deficits and impairments:  Abnormal gait, Decreased activity tolerance, Decreased balance, Decreased coordination, Decreased endurance, Decreased mobility, Decreased range of motion, Decreased strength, Difficulty walking, Impaired UE functional use, Postural dysfunction, Improper body mechanics  Visit Diagnosis: Muscle weakness (generalized)  Other abnormalities of gait and mobility     Problem List Patient Active Problem List   Diagnosis Date Noted  . Subcortical infarction ([place] 09/05/2019  . Anticoagulated on warfarin   . Left  hemiparesis ([facility]   . History of CVA with residual deficit   . Thrombocytopenia ([place]   . Prediabetes   . [place] (cerebral vascular accident) ([place] 09/03/2019  . Vertigo 09/24/2017  . Benign paroxysmal positional vertigo   . History of stroke   . Hyperlipemia 08/23/2017  . Encounter for therapeutic drug monitoring 08/08/2017  . Long term (current) use of anticoagulants [Z79.01] 07/12/2017  . 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 Abnormality of gait 09/07/2016  . HTN (hypertension) 04/13/2016  . H/O mitral valve replacement with mechanical valve 10/16/2015  . Diabetes mellitus without complication ([place]   . Hemiparesis (L sided - mild) due to old stroke ([place]     EElecta Sniff PT, DPT, NCS 02/07/2020, 1:35 PM  [facility] [address]Phone: [phone]  Fax:  [phone] Name: Gabrielle WIXMRN: [id number] of Birth: [birth year]

## 2020-02-07 NOTE — Progress Notes (Signed)
Subjective:    Patient ID: [name], female    DOB: 17-Dec-1945, 74 y.o.   MRN: [id number] 74 year old female with remote stroke back in 1997 who had a right MCA distribution infarct in October 2020.  She underwent inpatient rehab and was discharged 09/15/2019 from [facility].   HPI The patient states she just recently finished her outpatient therapy.  She is thus far performing her home exercise program.  She is independent with all her ADLs.  She ambulates with a single-point cane.  He denies any recent falls. Patient has a history of lumbar scoliosis with chronic low back pain.  Patient use topical creams with some improvement  The patient is asking about driving.  She has not driven since the most recent stroke.  She denies history of seizure disorder.  She was given the name of a driving evaluator who is an occupational therapist. Pain Inventory Average Pain 2 Pain Right Now 0 My pain is aching  In the last 24 hours, has pain interfered with the following? General activity 2 Relation with others 0 Enjoyment of life 0 What TIME of day is your pain at its worst? morning and night Sleep (in general) Fair  Pain is worse with: bending and standing Pain improves with: rest, heat/ice, therapy/exercise and medication Relief from Meds: 5  Mobility use a cane how many minutes can you walk? 15-20 ability to climb steps?  yes do you drive?  no  Function retired I need assistance with the following:  household duties and shopping  Neuro/Psych tremor dizziness  Prior Studies Any changes since last visit?  no  Physicians involved in your care Primary care Dr. Jannifer Franklin Neurologist Dr. Chales Salmon   Family History  Problem Relation Age of Onset  . Cancer Mother        Bone  . Heart disease Mother   . Hyperlipidemia Mother   . Hypertension Mother   . Stroke Father   . Hypertension Father   . Heart attack Neg Hx    Social History   Socioeconomic History  . Marital  status: Married    Spouse name: Not on file  . Number of children: 2  . 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 Highest education level: Not on file  Occupational History  . Occupation: Retired  Tobacco Use  . Smoking status: Never Smoker  . Smokeless tobacco: Never Used  Substance and Sexual Activity  . Alcohol use: No    Alcohol/week: 0.0 standard drinks  . Drug use: No  . Sexual activity: Not on file    Comment: Married  Other Topics Concern  . Not on file  Social History Narrative   Lives at home w/ her husband   Right-handed   Caffeine: none   Social Determinants of Health   Financial Resource Strain:   . Difficulty of Paying Living Expenses:   Food Insecurity:   . Worried About Charity fundraiser in the Last Year:   . Arboriculturist in the Last Year:   Transportation Needs:   . Film/video editor (Medical):   Marland Kitchen Lack of Transportation (Non-Medical):   Physical Activity:   . Days of Exercise per Week:   . Minutes of Exercise per Session:   Stress:   . Feeling of Stress :   Social Connections:   . Frequency of Communication with Friends and Family:   . Frequency of Social Gatherings with Friends and Family:   . Attends  Religious Services:   . Active Member of Clubs or Organizations:   . Attends Archivist Meetings:   Marland Kitchen Marital Status:    Past Surgical History:  Procedure Laterality Date  . ABDOMINAL HYSTERECTOMY    . BRAIN SURGERY    . BUNIONECTOMY  1993  . CARDIAC VALVE REPLACEMENT  1997  . TOE SURGERY  1996   Past Medical History:  Diagnosis Date  . Abnormality of gait 09/07/2016  . Allergy   . Diabetes mellitus without complication [facility])    Patient denies this - notes history of glucose intolerance  . Hemiparesis and alteration of sensations as late effects of stroke ([employer]) 09/07/2016  . History of pneumonia 1997  . S/P MVR (mitral valve replacement)    Mechanical mitral valve replacement at age 63 (done in [place])  // echo 7/17:  EF 55-60, normal wall motion, bileaflet mechanical mitral valve prosthesis functioning normally, mild LAE, mildly reduced RVSF, small pericardial effusion  . Stroke ([place]) 1997, 2013, 2015   BP 111/71   Pulse 80   Temp (!) 97.5 F (36.4 C)   Ht 5\' 7"  (1.702 m)   Wt 127 lb 3.2 oz (57.7 kg)   SpO2 95%   BMI 19.92 kg/m   Opioid Risk Score:   Fall Risk Score:  `1  Depression screen PHQ 2/9  Depression screen [facility] 2/9 10/22/2019 09/25/2019  Decreased Interest 0 0  Down, Depressed, Hopeless 0 0  PHQ - 2 Score 0 0  Some recent data might be hidden    Review of Systems  Respiratory: Positive for shortness of breath.   Neurological: Positive for dizziness and tremors.  All other systems reviewed and are negative.      Objective:   Physical Exam Vitals reviewed.  Constitutional:      Appearance: Normal appearance.  Eyes:     General: No visual field deficit.    Extraocular Movements: Extraocular movements intact.     Conjunctiva/sclera: Conjunctivae normal.     Pupils: Pupils are equal, round, and reactive to light.  Musculoskeletal:     Comments: Patient has levo convex scoliosis with right iliac crest elevated  Neurological:     Mental Status: She is alert and oriented to person, place, and time.     Cranial Nerves: No cranial nerve deficit, dysarthria or facial asymmetry.     Sensory: Sensory deficit present.     Motor: Motor function is intact. No weakness.     Coordination: Coordination abnormal.     Gait: Gait abnormal.     Comments: Patient is able to identify light touch on the left side however feels like it is more difficult to determine location than on the right side  His left AFO as well as a cane to ambulate.  No toe drag.  Psychiatric:        Mood and Affect: Mood normal.        Behavior: Behavior normal.     Strength is 5/5 in the right deltoid by stress grip hip flexion knee extension ankle dorsiflexion 4/5 in the left deltoid bicep tricep grip hip  flexion knee extension ankle dorsiflexion     Assessment & Plan:  #1.  History of right MCA infarct mild residual left hemiparesis and mild sensory deficits left upper extremity.  Patient wishes to resume driving I think from a motor standpoint she could go back to driving.  I do not see any contraindications such as uncontrolled seizures. I do think a driver's  evaluation with occupational therapy would be helpful to look at visual perceptual deficits given the history of right MCA location As discussed with patient and her husband, I would like to review the report by the OT driver evaluation prior to recommending going back to driving. Physical medicine rehab follow-up on as-needed basis

## 2020-02-07 NOTE — Patient Instructions (Addendum)
Access Code: [email or web address] URL: [email or web address] Date: 02/07/2020 Prepared by: Cherly Anderson  Exercises Backward Walking with Counter Support - 1 x daily - 5 x weekly - 3-5 reps - 1 sets Side Stepping with Counter Support - 1 x daily - 5 x weekly - 3-5 reps - 1 sets Standing Balance with Eyes Closed - 1 x daily - 5 x weekly - 3 reps - 1 sets - 20 sec hold Standing Tandem Balance with Counter Support - 2 x daily - 6 x weekly - 3 reps - 1 sets - 30 hold Standing March with Unilateral Counter Support - 2 x daily - 6 x weekly - 10 reps - 3 sets Forward Step Up - 1 x daily - 5 x weekly - 10 reps - 1 sets Sit to Stand without Arm Support - 2 x daily - 6 x weekly - 10 reps - 1-2 sets Standing Horizontal Saccades - 1 x daily - 5 x weekly - 10 reps - 1 sets Seated Vertical Saccades - 1 x daily - 5 x weekly - 10 reps - 1 sets Seated Gaze Stabilization with Head Rotation - 1 x daily - 5 x weekly - 3 reps - 1 sets - 20 hold Seated Gaze Stabilization with Head Nod - 1 x daily - 5 x weekly - 3 reps - 1 sets - 20 hold Bridge with Hip Abduction and Resistance - 1 x daily - 5 x weekly - 10 reps - 2 sets  Reviewed all of the above.

## 2020-02-08 NOTE — Progress Notes (Signed)
I agree with the above plan 

## 2020-02-11 ENCOUNTER — Ambulatory Visit (INDEPENDENT_AMBULATORY_CARE_PROVIDER_SITE_OTHER): Payer: Medicare Other | Admitting: Pharmacist

## 2020-02-11 DIAGNOSIS — Z7901 Long term (current) use of anticoagulants: Secondary | ICD-10-CM | POA: Diagnosis not present

## 2020-02-11 DIAGNOSIS — Z5181 Encounter for therapeutic drug level monitoring: Secondary | ICD-10-CM | POA: Diagnosis not present

## 2020-02-11 DIAGNOSIS — Z952 Presence of prosthetic heart valve: Secondary | ICD-10-CM

## 2020-02-11 DIAGNOSIS — I69359 Hemiplegia and hemiparesis following cerebral infarction affecting unspecified side: Secondary | ICD-10-CM | POA: Diagnosis not present

## 2020-02-11 LAB — POCT INR: INR: 4 — AB (ref 2.0–3.0)

## 2020-02-18 ENCOUNTER — Ambulatory Visit (INDEPENDENT_AMBULATORY_CARE_PROVIDER_SITE_OTHER): Payer: Medicare Other | Admitting: Internal Medicine

## 2020-02-18 DIAGNOSIS — Z5181 Encounter for therapeutic drug level monitoring: Secondary | ICD-10-CM | POA: Diagnosis not present

## 2020-02-18 LAB — POCT INR: INR: 2.8 (ref 2.0–3.0)

## 2020-02-18 NOTE — Patient Instructions (Signed)
Description   Spoke with pt and instructed pt to take 2 tablets of warfarin today and then continue same dosage 5mg  daily except 7.5mg  on Mondays and Fridays. Pt will continue greens with 2 cups of lettuce on Fridays. Does weekly checks due to insurance. Recheck in INR in 1 week.

## 2020-02-21 ENCOUNTER — Ambulatory Visit: Payer: Medicare Other | Admitting: Physical Medicine & Rehabilitation

## 2020-02-25 ENCOUNTER — Ambulatory Visit (INDEPENDENT_AMBULATORY_CARE_PROVIDER_SITE_OTHER): Payer: Medicare Other | Admitting: *Deleted

## 2020-02-25 DIAGNOSIS — I69359 Hemiplegia and hemiparesis following cerebral infarction affecting unspecified side: Secondary | ICD-10-CM | POA: Diagnosis not present

## 2020-02-25 DIAGNOSIS — Z5181 Encounter for therapeutic drug level monitoring: Secondary | ICD-10-CM

## 2020-02-25 DIAGNOSIS — Z952 Presence of prosthetic heart valve: Secondary | ICD-10-CM | POA: Diagnosis not present

## 2020-02-25 DIAGNOSIS — Z7901 Long term (current) use of anticoagulants: Secondary | ICD-10-CM

## 2020-02-25 LAB — POCT INR: INR: 4 — AB (ref 2.0–3.0)

## 2020-02-25 NOTE — Patient Instructions (Signed)
Description   Spoke with pt and instructed pt to take Warfarin 2.5mg  today and have a small salad (1 cup) today then continue same dosage 5mg  daily except 7.5mg  on Mondays and Fridays. Pt will continue greens with 2 cups of lettuce on Fridays. Does weekly checks due to insurance. Recheck in INR in 1 week.

## 2020-03-03 ENCOUNTER — Ambulatory Visit (INDEPENDENT_AMBULATORY_CARE_PROVIDER_SITE_OTHER): Payer: Medicare Other | Admitting: Pharmacist

## 2020-03-03 DIAGNOSIS — Z952 Presence of prosthetic heart valve: Secondary | ICD-10-CM

## 2020-03-03 DIAGNOSIS — I69359 Hemiplegia and hemiparesis following cerebral infarction affecting unspecified side: Secondary | ICD-10-CM

## 2020-03-03 DIAGNOSIS — Z7901 Long term (current) use of anticoagulants: Secondary | ICD-10-CM

## 2020-03-03 DIAGNOSIS — Z5181 Encounter for therapeutic drug level monitoring: Secondary | ICD-10-CM

## 2020-03-03 LAB — POCT INR: INR: 3.5 — AB (ref 2.0–3.0)

## 2020-03-10 ENCOUNTER — Ambulatory Visit (INDEPENDENT_AMBULATORY_CARE_PROVIDER_SITE_OTHER): Payer: Medicare Other | Admitting: Pharmacist

## 2020-03-10 DIAGNOSIS — I69359 Hemiplegia and hemiparesis following cerebral infarction affecting unspecified side: Secondary | ICD-10-CM

## 2020-03-10 DIAGNOSIS — Z7901 Long term (current) use of anticoagulants: Secondary | ICD-10-CM

## 2020-03-10 DIAGNOSIS — Z5181 Encounter for therapeutic drug level monitoring: Secondary | ICD-10-CM | POA: Diagnosis not present

## 2020-03-10 DIAGNOSIS — Z952 Presence of prosthetic heart valve: Secondary | ICD-10-CM | POA: Diagnosis not present

## 2020-03-10 LAB — POCT INR: INR: 3.5 — AB (ref 2.0–3.0)

## 2020-03-12 IMAGING — CR DG LUMBAR SPINE 2-3V
3 series · 3 of 3 positions shown · non-contrast
Comparison: None.

CLINICAL DATA: Lumbar pain.

EXAM:
LUMBAR SPINE - 2-3 VIEW

[w lumbar spine ap]
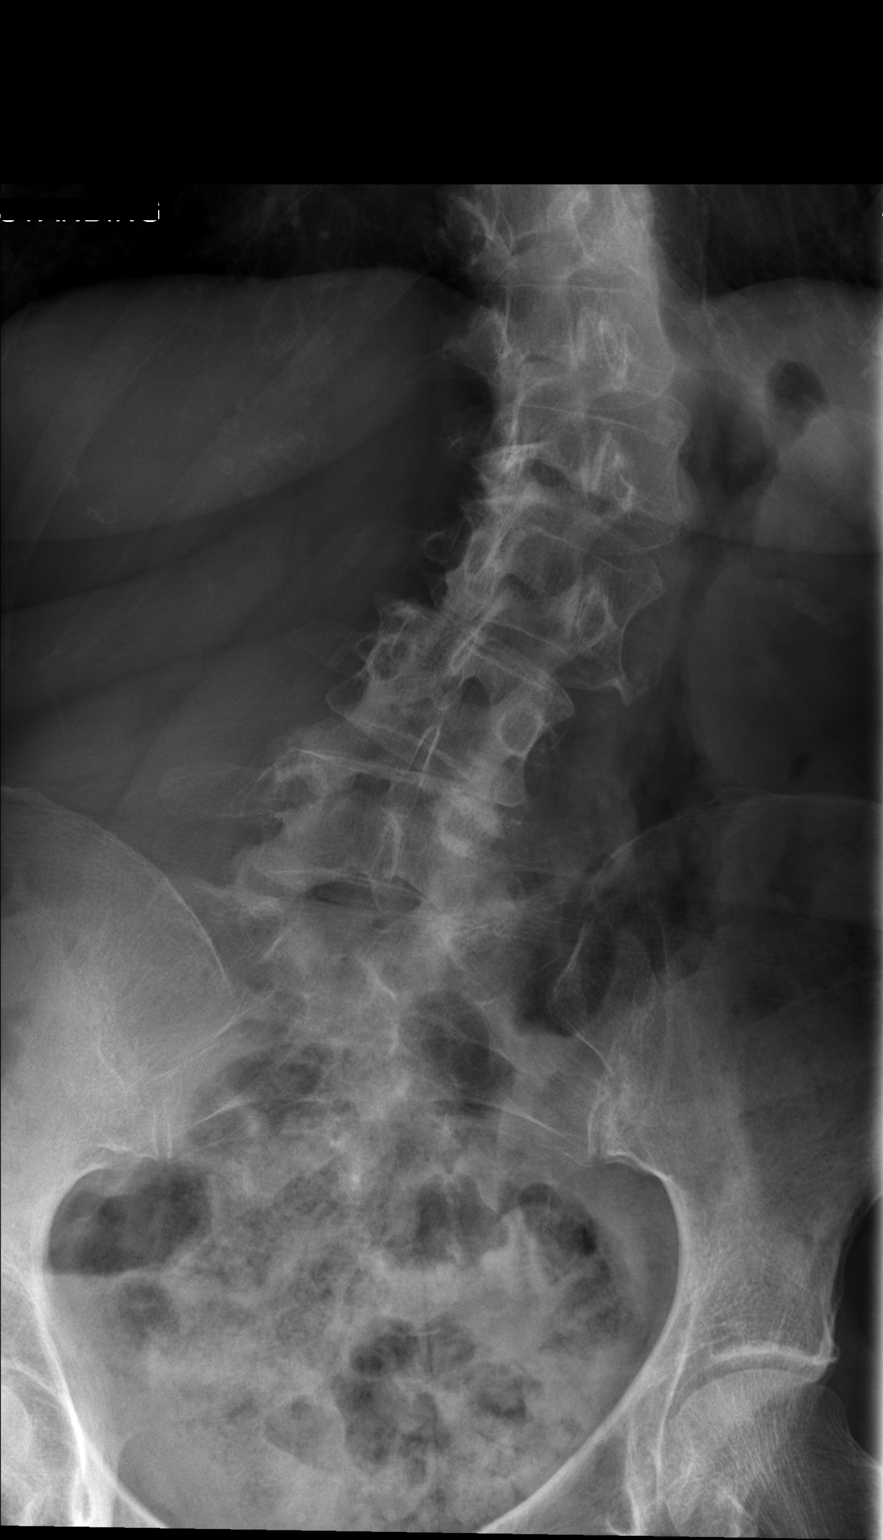

[w lumbar spine lat]
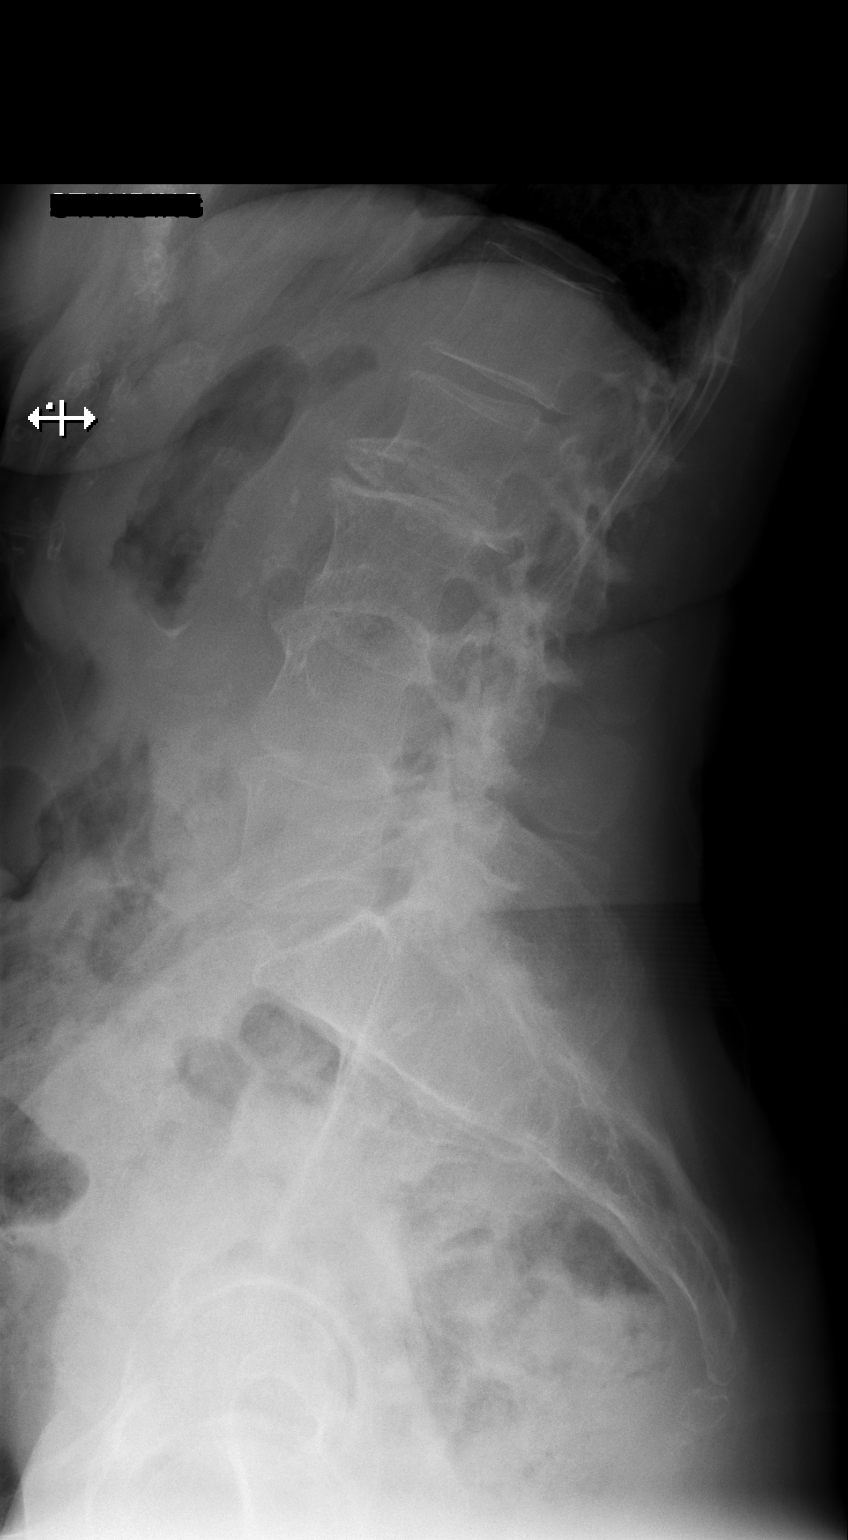

[w lumbar l-5 s-1 spot]
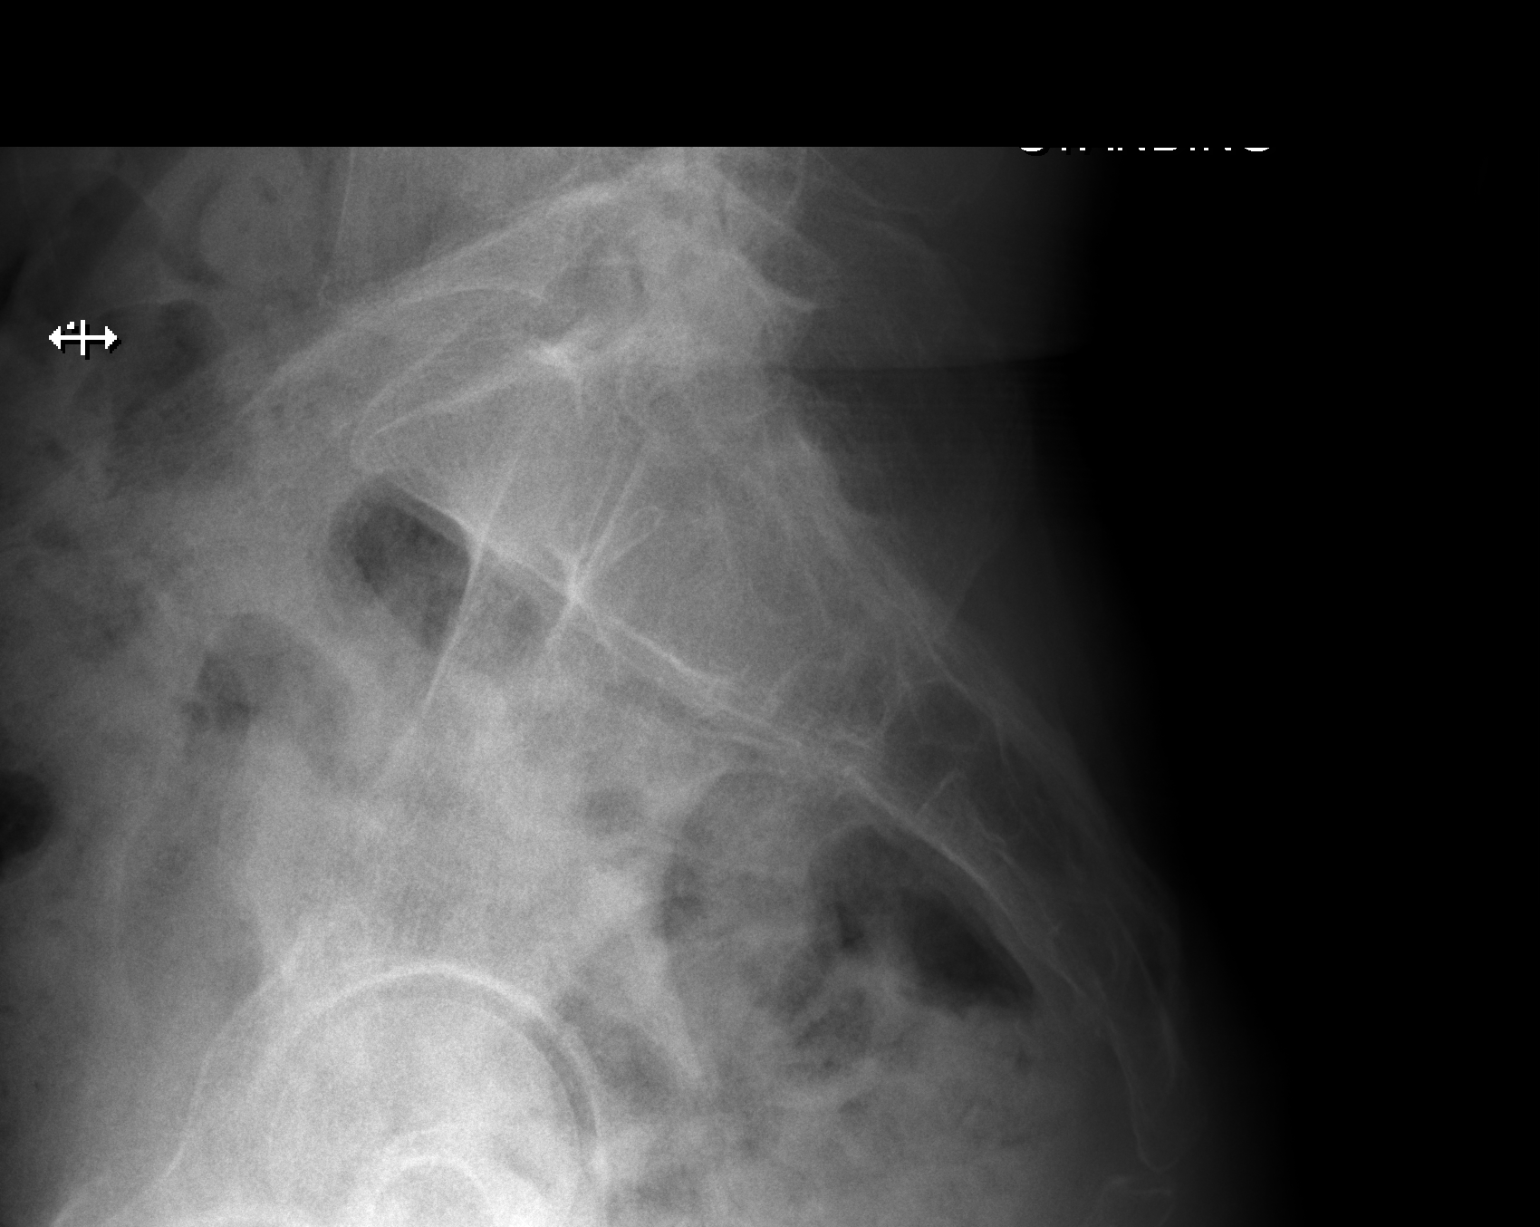

[3 of 3 positions shown; findings below may reference images not displayed]

FINDINGS: There is moderate to severe levoscoliosis of the lumbar spine
centered at the L2-L3 level. Multilevel degenerative changes are
noted throughout the lumbar spine, greatest at the L3-L4 and L2-L3
levels. There is no displaced fracture. No dislocation.
IMPRESSION: Moderate to severe levoscoliosis of the lumbar spine with multilevel
degenerative changes in the lumbar spine. No displaced fracture or
dislocation.

## 2020-03-17 ENCOUNTER — Ambulatory Visit (INDEPENDENT_AMBULATORY_CARE_PROVIDER_SITE_OTHER): Payer: Medicare Other | Admitting: Internal Medicine

## 2020-03-17 DIAGNOSIS — Z5181 Encounter for therapeutic drug level monitoring: Secondary | ICD-10-CM | POA: Diagnosis not present

## 2020-03-17 LAB — POCT INR: INR: 3.6 — AB (ref 2.0–3.0)

## 2020-03-17 NOTE — Patient Instructions (Signed)
Description   Take 5mg  today and then continue same dosage 5mg  daily except 7.5mg  on Mondays and Fridays. Pt will continue greens with 2 cups of lettuce on Fridays. Does weekly checks due to insurance. Recheck in INR in 1 week.

## 2020-03-24 ENCOUNTER — Ambulatory Visit (INDEPENDENT_AMBULATORY_CARE_PROVIDER_SITE_OTHER): Payer: Medicare Other | Admitting: Pharmacist

## 2020-03-24 DIAGNOSIS — I69359 Hemiplegia and hemiparesis following cerebral infarction affecting unspecified side: Secondary | ICD-10-CM | POA: Diagnosis not present

## 2020-03-24 DIAGNOSIS — Z7901 Long term (current) use of anticoagulants: Secondary | ICD-10-CM

## 2020-03-24 DIAGNOSIS — Z952 Presence of prosthetic heart valve: Secondary | ICD-10-CM | POA: Diagnosis not present

## 2020-03-24 DIAGNOSIS — Z5181 Encounter for therapeutic drug level monitoring: Secondary | ICD-10-CM | POA: Diagnosis not present

## 2020-03-24 LAB — POCT INR: INR: 3.4 — AB (ref 2.0–3.0)

## 2020-03-28 DIAGNOSIS — M810 Age-related osteoporosis without current pathological fracture: Secondary | ICD-10-CM | POA: Diagnosis not present

## 2020-03-28 DIAGNOSIS — E7800 Pure hypercholesterolemia, unspecified: Secondary | ICD-10-CM | POA: Diagnosis not present

## 2020-03-28 DIAGNOSIS — R7301 Impaired fasting glucose: Secondary | ICD-10-CM | POA: Diagnosis not present

## 2020-03-31 ENCOUNTER — Ambulatory Visit (INDEPENDENT_AMBULATORY_CARE_PROVIDER_SITE_OTHER): Payer: Medicare Other | Admitting: Pharmacist

## 2020-03-31 ENCOUNTER — Telehealth: Payer: Self-pay | Admitting: Cardiovascular Disease

## 2020-03-31 DIAGNOSIS — Z7901 Long term (current) use of anticoagulants: Secondary | ICD-10-CM

## 2020-03-31 DIAGNOSIS — Z952 Presence of prosthetic heart valve: Secondary | ICD-10-CM

## 2020-03-31 DIAGNOSIS — I69359 Hemiplegia and hemiparesis following cerebral infarction affecting unspecified side: Secondary | ICD-10-CM

## 2020-03-31 DIAGNOSIS — Z5181 Encounter for therapeutic drug level monitoring: Secondary | ICD-10-CM | POA: Diagnosis not present

## 2020-03-31 LAB — POCT INR: INR: 3.7 — AB (ref 2.0–3.0)

## 2020-03-31 NOTE — Telephone Encounter (Signed)
Per Caren Griffins [facility] pt with critical INR of 3.7.  Will forward information to [place] [facility] for review and recommendation.

## 2020-03-31 NOTE — Telephone Encounter (Signed)
  Calling with critical INR

## 2020-04-04 DIAGNOSIS — D692 Other nonthrombocytopenic purpura: Secondary | ICD-10-CM | POA: Diagnosis not present

## 2020-04-04 DIAGNOSIS — R82998 Other abnormal findings in urine: Secondary | ICD-10-CM | POA: Diagnosis not present

## 2020-04-04 DIAGNOSIS — K219 Gastro-esophageal reflux disease without esophagitis: Secondary | ICD-10-CM | POA: Diagnosis not present

## 2020-04-04 DIAGNOSIS — Z0000 Encounter for general adult medical examination without abnormal findings: Secondary | ICD-10-CM | POA: Diagnosis not present

## 2020-04-04 DIAGNOSIS — G8194 Hemiplegia, unspecified affecting left nondominant side: Secondary | ICD-10-CM | POA: Diagnosis not present

## 2020-04-04 DIAGNOSIS — E7800 Pure hypercholesterolemia, unspecified: Secondary | ICD-10-CM | POA: Diagnosis not present

## 2020-04-04 DIAGNOSIS — I69998 Other sequelae following unspecified cerebrovascular disease: Secondary | ICD-10-CM | POA: Diagnosis not present

## 2020-04-04 DIAGNOSIS — N182 Chronic kidney disease, stage 2 (mild): Secondary | ICD-10-CM | POA: Diagnosis not present

## 2020-04-04 DIAGNOSIS — I679 Cerebrovascular disease, unspecified: Secondary | ICD-10-CM | POA: Diagnosis not present

## 2020-04-04 DIAGNOSIS — Z1331 Encounter for screening for depression: Secondary | ICD-10-CM | POA: Diagnosis not present

## 2020-04-04 DIAGNOSIS — I129 Hypertensive chronic kidney disease with stage 1 through stage 4 chronic kidney disease, or unspecified chronic kidney disease: Secondary | ICD-10-CM | POA: Diagnosis not present

## 2020-04-04 DIAGNOSIS — M810 Age-related osteoporosis without current pathological fracture: Secondary | ICD-10-CM | POA: Diagnosis not present

## 2020-04-04 DIAGNOSIS — R809 Proteinuria, unspecified: Secondary | ICD-10-CM | POA: Diagnosis not present

## 2020-04-07 DIAGNOSIS — Z952 Presence of prosthetic heart valve: Secondary | ICD-10-CM | POA: Diagnosis not present

## 2020-04-07 DIAGNOSIS — Z7901 Long term (current) use of anticoagulants: Secondary | ICD-10-CM | POA: Diagnosis not present

## 2020-04-07 LAB — POCT INR: INR: 3.5 — AB (ref 2.0–3.0)

## 2020-04-08 ENCOUNTER — Ambulatory Visit (INDEPENDENT_AMBULATORY_CARE_PROVIDER_SITE_OTHER): Payer: Medicare Other | Admitting: *Deleted

## 2020-04-08 ENCOUNTER — Telehealth: Payer: Self-pay | Admitting: Cardiovascular Disease

## 2020-04-08 ENCOUNTER — Other Ambulatory Visit: Payer: Self-pay | Admitting: Pharmacist

## 2020-04-08 DIAGNOSIS — Z952 Presence of prosthetic heart valve: Secondary | ICD-10-CM

## 2020-04-08 DIAGNOSIS — Z5181 Encounter for therapeutic drug level monitoring: Secondary | ICD-10-CM | POA: Diagnosis not present

## 2020-04-08 DIAGNOSIS — I69359 Hemiplegia and hemiparesis following cerebral infarction affecting unspecified side: Secondary | ICD-10-CM | POA: Diagnosis not present

## 2020-04-08 DIAGNOSIS — Z7901 Long term (current) use of anticoagulants: Secondary | ICD-10-CM

## 2020-04-08 MED ORDER — WARFARIN SODIUM 5 MG PO TABS: ORAL_TABLET | ORAL | 3 refills | Status: DC

## 2020-04-08 NOTE — Telephone Encounter (Signed)
Called and spoke with patient to ask if she is currently having chest pain. Patient reports her CP and SOB has been intermittent for a while and that Dr. Osborne Casco felt that she should be seen by Dr. Acie Fredrickson soon. Patient states she is fine to wait until tomorrow to be seen - she states Dr. Osborne Casco was more concerned that she was. She requests that her husband accompany her to the appointment which I have added to appointment notes so that our staff will be aware. She thanked me for the call. I called and spoke to [facility] at Dr. Loren Racer office advised her of the plan. She thanked me for the call and I thanked her for letting us know of their concerns.

## 2020-04-08 NOTE — Telephone Encounter (Signed)
Pt c/o of Chest Pain: STAT if CP now or developed within 24 hours  1. Are you having CP right now? Not sure   2. Are you experiencing any other symptoms (ex. SOB, nausea, vomiting, sweating)? SOB   3. How long have you been experiencing CP? Seems to be a few weeks   4. Is your CP continuous or coming and going? Coming and going   5. Have you taken Nitroglycerin? Not that they are aware   Breanna from the patient's PCP office is calling stating the patient came into their office for an appointment complaining of SOB and CP. She states from the office notes recorded by Dr. Osborne Casco the CP occurs upon exertion normally when she takes walks with her husband and subsides once she slows down. From what Bubba Hales can tell she did not report any other symptoms besides SOB and the patient has not taken any Nitroglycerin in regards to it. Bubba Hales states she thinks this has been going on for a few weeks. An appointment has been scheduled in regards to this for tomorrow 04/09/20 at 1:40 PM. Bubba Hales was not with the patient in order to receive further information the appointment with Dr. Osborne Casco was prior to the call. Please advise.  ?

## 2020-04-08 NOTE — Patient Instructions (Signed)
Description   Continue same dosage 5mg  daily except 7.5mg  on Mondays and Fridays. Pt will continue greens with 2 cups of lettuce on Fridays. Does weekly checks due to insurance. Recheck in INR in 1 week.

## 2020-04-08 NOTE — Progress Notes (Signed)
Last INR 3.5 (goal 3.0-3.5).  Rx approved

## 2020-04-09 ENCOUNTER — Other Ambulatory Visit: Payer: Self-pay

## 2020-04-09 ENCOUNTER — Encounter: Payer: Self-pay | Admitting: Cardiovascular Disease

## 2020-04-09 ENCOUNTER — Ambulatory Visit (INDEPENDENT_AMBULATORY_CARE_PROVIDER_SITE_OTHER): Payer: Medicare Other | Admitting: Cardiovascular Disease

## 2020-04-09 VITALS — BP 108/72 | HR 80 | Ht 67.0 in | Wt 128.0 lb

## 2020-04-09 DIAGNOSIS — Z952 Presence of prosthetic heart valve: Secondary | ICD-10-CM | POA: Diagnosis not present

## 2020-04-09 DIAGNOSIS — I10 Essential (primary) hypertension: Secondary | ICD-10-CM | POA: Diagnosis not present

## 2020-04-09 NOTE — Patient Instructions (Signed)
Your physician recommends that you continue on your current medications as directed. Please refer to the Current Medication list given to you today.   Your physician recommends that you schedule a follow-up appointment in: AS PLANNED  

## 2020-04-09 NOTE — Progress Notes (Signed)
Cardiology Office Note   Date:  04/09/2020   ID:  [name], DOB 04-09-1946, MRN [id number]  PCP:  Haywood Pao, MD  Cardiologist:   Mertie Moores, MD   No chief complaint on file.  Problem List 1. Mitral valve replacement  - at age 74.  2. Essential HTN 3. Hyperlipidemia 4. DM.  5.  Multiple strokes   [name] is a 74 y.o. female who presents for management of her mitral valve replacement . Was seen with her husband [name].  Moved from [place], [place] recently  Has had 4 strokes and a TIA.  She does her own INR monitoring  - the strokes were not associated with marked INR variations Exercises some, not as much as when she lived in [place].   Does water aerobics.  Walks on occasion .  Also does physical therapy exercises.   April 13, 2016:  Doing well.  INRs have been OK. ( 3.2)   She measures her own.  Last week the INR levels were off when she was on Abx for a UTI .   Oct. 4, 2017:  Doing well.  S/p MVR 1997.   INR levels are ok , checks it at home and in our office  2. 9 last week   February 28, 2018: [name] is seen today for follow-up of her mitral valve replacement and hypertension.  She was last seen in our office in October, 2019 by Pecolia Ades,  NP . INR has been theraputuc because of her hx of strokes.  Goal INR is 3.0-3.5   April 09, 2020:   [name] is seen today for follow-up visit regarding her mitral valve replacement.  She was last seen in the office approximately 2 years ago. She had a stroke in October, 2020.Marland Kitchen  She has had strokes in the past.  Has occasional episodes of chest tightness.  Occurs spontaneously , not exertional  Some of the episodes tend to occur with mental stress.  She does 30 min of exercise in the am.  Does not have CP in the morning .   Past Medical History:  Diagnosis Date  . Abnormality of gait 09/07/2016  . Allergy   . Diabetes mellitus without complication [facility])    Patient denies this - notes history of glucose  intolerance  . Hemiparesis and alteration of sensations as late effects of stroke ([place]) 09/07/2016  . History of pneumonia 1997  . S/P MVR (mitral valve replacement)    Mechanical mitral valve replacement at age 22 (done in [place])  // echo 7/17: EF 55-60, normal wall motion, bileaflet mechanical mitral valve prosthesis functioning normally, mild LAE, mildly reduced RVSF, small pericardial effusion  . Stroke [facility]) 1997, 2013, 2015    Past Surgical History:  Procedure Laterality Date  . ABDOMINAL HYSTERECTOMY    . BRAIN SURGERY    . BUNIONECTOMY  1993  . CARDIAC VALVE REPLACEMENT  1997  . TOE SURGERY  1996     Current Outpatient Medications  Medication Sig Dispense Refill  . acetaminophen (TYLENOL) 325 MG tablet Take 2 tablets (650 mg total) by mouth every 4 (four) hours as needed for mild pain (or temp > 37.5 C (99.5 F)).    Marland Kitchen amLODipine (NORVASC) 10 MG tablet Take 1 tablet (10 mg total) by mouth daily. 30 tablet 0  . amoxicillin (AMOXIL) 500 MG capsule Take 4 capsules by mouth as needed. 1 hour before appointment    . aspirin EC 81 MG tablet  Take 81 mg by mouth daily.     Marland Kitchen atorvastatin (LIPITOR) 20 MG tablet Take 1 tablet (20 mg total) by mouth daily. 30 tablet 0  . calcium carbonate 1250 MG capsule Take 1,200 mg by mouth at bedtime.     . cholecalciferol (VITAMIN D) 1000 UNITS tablet Take 2,000 Units daily by mouth.     . denosumab (PROLIA) 60 MG/ML SOLN injection Inject 60 mg into the skin every 6 (six) months. Administer in upper arm, thigh, or abdomen    . docusate sodium (COLACE) 100 MG capsule Take 100 mg at bedtime by mouth.     Marland Kitchen omeprazole (PRILOSEC) 20 MG capsule Take 20 mg by mouth daily.    Marland Kitchen Propylene Glycol-Glycerin (SOOTHE) 0.6-0.6 % SOLN Place 1 drop into both eyes 2 (two) times daily as needed (dry eyes).    . warfarin (COUMADIN) 5 MG tablet TAKE BY MOUTH AS DIRECTED BY THE COUMADIN CLINIC FOR 30 DAYS. 45 tablet 3   No current facility-administered medications for  this visit.    Allergies:   Zoloft [sertraline]    Social History:  The patient  reports that she has never smoked. She has never used smokeless tobacco. She reports that she does not drink alcohol or use drugs.   Family History:  The patient's family history includes Cancer in her mother; Heart disease in her mother; Hyperlipidemia in her mother; Hypertension in her father and mother; Stroke in her father.    ROS:   Noted in current history, otherwise review of systems is negative.   Physical Exam: Blood pressure 108/72, pulse 80, height 5\' 7"  (1.702 m), weight 128 lb (58.1 kg), SpO2 97 %.  GEN:  Well nourished, well developed in no acute distress HEENT: Normal NECK: No JVD; No carotid bruits LYMPHATICS: No lymphadenopathy CARDIAC: RRR, mechanical S1  RESPIRATORY:  Clear to auscultation without rales, wheezing or rhonchi  ABDOMEN: Soft, non-tender, non-distended MUSCULOSKELETAL:  No edema; No deformity  SKIN: Warm and dry NEUROLOGIC:  S/p several strokes.    EKG:   Normal sinus rhythm at 79.  First-degree AV block with a PR interval of 260 ms.  No ST or T wave changes.   Recent Labs: 09/05/2019: Magnesium 1.9 09/06/2019: ALT 16; BUN 19; Creatinine, Ser 0.67; Potassium 3.8; Sodium 140 09/15/2019: Hemoglobin 12.3; Platelets 173    Lipid Panel    Component Value Date/Time   CHOL 125 09/04/2019 0359   TRIG 29 09/04/2019 0359   HDL 54 09/04/2019 0359   CHOLHDL 2.3 09/04/2019 0359   VLDL 6 09/04/2019 0359   LDLCALC 65 09/04/2019 0359      Wt Readings from Last 3 Encounters:  04/09/20 128 lb (58.1 kg)  02/07/20 127 lb 3.2 oz (57.7 kg)  01/28/20 128 lb 9.6 oz (58.3 kg)      Other studies Reviewed: Additional studies/ records that were reviewed today include:  Review of the above records demonstrates:    ASSESSMENT AND PLAN:  1.   Mitral valve replacement:  INR is well controllled.    2. Hypertension:   BP is well controlled.   3. Frequent falls: She has had  some vertigo in the past.  She also has several strokes. Further   4.  Chest pain: She is having some episodes of chest pain but after discussing it for quite some time she thinks that it may be due to anxiety.  She tends to get chest pain if she is rushing around trying to get someplace  but she does not necessarily think they are related to exertion.  I will see her back at her regularly scheduled appointment in June 20, 2020.  If she continues to have episodes of chest pain anticipate lowering the dose of her amlodipine and starting her on isosorbide.  I would prefer not to do a heart catheterization unless we absolutely need to because it would involve bring her off the Coumadin and that may increase her risk of strokes.  Even with bridging with Lovenox is still poses some increased risk.  We will see how she is doing at her next appointment.  If she continues to have episodes at or similar to angina then we may need to refer her for further studies.  Current medicines are reviewed at length with the patient today.  The patient does not have concerns regarding medicines.  The following changes have been made:  no change  Labs/ tests ordered today include:   Orders Placed This Encounter  Procedures  . EKG 12-Lead     Disposition:   FU with me in 1 year      Mertie Moores, MD  04/09/2020 5:29 PM    [facility] [place], [place], [place]  [address] Phone: [phone]; Fax: [phone]

## 2020-04-14 ENCOUNTER — Ambulatory Visit (INDEPENDENT_AMBULATORY_CARE_PROVIDER_SITE_OTHER): Payer: Medicare Other | Admitting: Cardiovascular Disease

## 2020-04-14 DIAGNOSIS — Z5181 Encounter for therapeutic drug level monitoring: Secondary | ICD-10-CM | POA: Diagnosis not present

## 2020-04-14 LAB — POCT INR: INR: 3.5 — AB (ref 2.0–3.0)

## 2020-04-14 NOTE — Patient Instructions (Addendum)
Description   Continue same dosage 5mg  daily except 7.5mg  on Mondays and Fridays. Informed pt that she could eat a extra salad today, pt usually has 2 cups of Romaine Lettuce on Fridays. Does weekly checks due to insurance. Recheck in INR in 1 week.

## 2020-04-21 ENCOUNTER — Ambulatory Visit (INDEPENDENT_AMBULATORY_CARE_PROVIDER_SITE_OTHER): Payer: Medicare Other | Admitting: Interventional Cardiology

## 2020-04-21 ENCOUNTER — Telehealth: Payer: Self-pay | Admitting: Cardiovascular Disease

## 2020-04-21 DIAGNOSIS — Z952 Presence of prosthetic heart valve: Secondary | ICD-10-CM | POA: Diagnosis not present

## 2020-04-21 DIAGNOSIS — Z7901 Long term (current) use of anticoagulants: Secondary | ICD-10-CM | POA: Diagnosis not present

## 2020-04-21 DIAGNOSIS — I69359 Hemiplegia and hemiparesis following cerebral infarction affecting unspecified side: Secondary | ICD-10-CM

## 2020-04-21 DIAGNOSIS — Z5181 Encounter for therapeutic drug level monitoring: Secondary | ICD-10-CM

## 2020-04-21 LAB — POCT INR: INR: 3.9 — AB (ref 2.0–3.0)

## 2020-04-21 NOTE — Telephone Encounter (Signed)
[facility] calling with out of range INR.

## 2020-04-21 NOTE — Telephone Encounter (Signed)
Already addressed in anticoag encounter from today.

## 2020-04-21 NOTE — Patient Instructions (Signed)
Description   Spoke with pt and advised her to take 2.5mg  today, then continue same dosage 5mg  daily except 7.5mg  on Mondays and Fridays. Informed pt that she could eat a extra salad today, pt usually has 2 cups of Romaine Lettuce on Fridays. Does weekly checks due to insurance. Recheck in INR in 1 week.

## 2020-04-28 ENCOUNTER — Ambulatory Visit (INDEPENDENT_AMBULATORY_CARE_PROVIDER_SITE_OTHER): Payer: Medicare Other | Admitting: Pharmacist

## 2020-04-28 DIAGNOSIS — Z952 Presence of prosthetic heart valve: Secondary | ICD-10-CM

## 2020-04-28 DIAGNOSIS — Z7901 Long term (current) use of anticoagulants: Secondary | ICD-10-CM | POA: Diagnosis not present

## 2020-04-28 DIAGNOSIS — I69359 Hemiplegia and hemiparesis following cerebral infarction affecting unspecified side: Secondary | ICD-10-CM

## 2020-04-28 DIAGNOSIS — Z5181 Encounter for therapeutic drug level monitoring: Secondary | ICD-10-CM

## 2020-04-28 LAB — POCT INR: INR: 3 (ref 2.0–3.0)

## 2020-04-28 NOTE — Patient Instructions (Signed)
Description   Spoke with patient.  Continue same dosage 5mg  daily except 7.5mg  on Mondays and Fridays pending new medication. Patient will call back with name of antibiotic. Does weekly checks due to insurance. Recheck in INR in 1 week.

## 2020-04-29 ENCOUNTER — Telehealth: Payer: Self-pay | Admitting: *Deleted

## 2020-04-29 NOTE — Telephone Encounter (Signed)
Pt called and stated that she only took 1 tablet of warfarin yesterday (5mg ) instead on 1.5 tablets (7.5mg ). Instructed pt that she could take an extra 1/2 tablet tonight so that she will take 1.5 tablets (7.5mg ) instead of her normal dose 1 tablet (5mg ). Pt will have INR rechecked on Monday 6/28.

## 2020-05-05 ENCOUNTER — Ambulatory Visit (INDEPENDENT_AMBULATORY_CARE_PROVIDER_SITE_OTHER): Payer: Medicare Other

## 2020-05-05 DIAGNOSIS — I69359 Hemiplegia and hemiparesis following cerebral infarction affecting unspecified side: Secondary | ICD-10-CM

## 2020-05-05 DIAGNOSIS — Z952 Presence of prosthetic heart valve: Secondary | ICD-10-CM

## 2020-05-05 DIAGNOSIS — Z5181 Encounter for therapeutic drug level monitoring: Secondary | ICD-10-CM

## 2020-05-05 DIAGNOSIS — Z7901 Long term (current) use of anticoagulants: Secondary | ICD-10-CM

## 2020-05-05 LAB — POCT INR: INR: 3.5 — AB (ref 2.0–3.0)

## 2020-05-05 NOTE — Patient Instructions (Signed)
Description   Spoke with patient.  Continue same dosage 5mg  daily except 7.5mg  on Mondays and Fridays.  Does weekly checks due to insurance. Recheck in INR in 1 week.

## 2020-05-12 LAB — POCT INR: INR: 3.6 — AB (ref 2.0–3.0)

## 2020-05-13 ENCOUNTER — Ambulatory Visit (INDEPENDENT_AMBULATORY_CARE_PROVIDER_SITE_OTHER): Payer: Medicare Other | Admitting: Cardiovascular Disease

## 2020-05-13 DIAGNOSIS — I69359 Hemiplegia and hemiparesis following cerebral infarction affecting unspecified side: Secondary | ICD-10-CM

## 2020-05-13 DIAGNOSIS — Z5181 Encounter for therapeutic drug level monitoring: Secondary | ICD-10-CM

## 2020-05-13 DIAGNOSIS — Z7901 Long term (current) use of anticoagulants: Secondary | ICD-10-CM | POA: Diagnosis not present

## 2020-05-13 DIAGNOSIS — Z952 Presence of prosthetic heart valve: Secondary | ICD-10-CM

## 2020-05-13 NOTE — Patient Instructions (Signed)
Spoke with patient. Instructed her to eat a salad this afternoon and then continue same dosage 5mg  daily except 7.5mg  on Mondays and Fridays.  Does weekly checks due to insurance. Recheck in INR in 1 week.

## 2020-05-19 ENCOUNTER — Ambulatory Visit (INDEPENDENT_AMBULATORY_CARE_PROVIDER_SITE_OTHER): Payer: Medicare Other | Admitting: Pharmacist

## 2020-05-19 ENCOUNTER — Telehealth: Payer: Self-pay | Admitting: Cardiovascular Disease

## 2020-05-19 DIAGNOSIS — Z7901 Long term (current) use of anticoagulants: Secondary | ICD-10-CM

## 2020-05-19 DIAGNOSIS — I69359 Hemiplegia and hemiparesis following cerebral infarction affecting unspecified side: Secondary | ICD-10-CM

## 2020-05-19 DIAGNOSIS — Z952 Presence of prosthetic heart valve: Secondary | ICD-10-CM | POA: Diagnosis not present

## 2020-05-19 DIAGNOSIS — Z5181 Encounter for therapeutic drug level monitoring: Secondary | ICD-10-CM

## 2020-05-19 LAB — POCT INR: INR: 3.6 — AB (ref 2.0–3.0)

## 2020-05-19 NOTE — Telephone Encounter (Signed)
Debbie with [facility] is calling to report INR results for PN's nurse. Please call to discuss.

## 2020-05-19 NOTE — Telephone Encounter (Signed)
Returned a call to [facility] and advised we had received INR of 3.6 today and discussed with pt.

## 2020-05-20 DIAGNOSIS — D225 Melanocytic nevi of trunk: Secondary | ICD-10-CM | POA: Diagnosis not present

## 2020-05-20 DIAGNOSIS — L821 Other seborrheic keratosis: Secondary | ICD-10-CM | POA: Diagnosis not present

## 2020-05-20 DIAGNOSIS — D3611 Benign neoplasm of peripheral nerves and autonomic nervous system of face, head, and neck: Secondary | ICD-10-CM | POA: Diagnosis not present

## 2020-05-26 ENCOUNTER — Ambulatory Visit (INDEPENDENT_AMBULATORY_CARE_PROVIDER_SITE_OTHER): Payer: Medicare Other | Admitting: Cardiology

## 2020-05-26 DIAGNOSIS — Z5181 Encounter for therapeutic drug level monitoring: Secondary | ICD-10-CM | POA: Diagnosis not present

## 2020-05-26 DIAGNOSIS — H04123 Dry eye syndrome of bilateral lacrimal glands: Secondary | ICD-10-CM | POA: Diagnosis not present

## 2020-05-26 DIAGNOSIS — H25013 Cortical age-related cataract, bilateral: Secondary | ICD-10-CM | POA: Diagnosis not present

## 2020-05-26 DIAGNOSIS — H5202 Hypermetropia, left eye: Secondary | ICD-10-CM | POA: Diagnosis not present

## 2020-05-26 LAB — POCT INR: INR: 3.5 — AB (ref 2.0–3.0)

## 2020-05-26 NOTE — Patient Instructions (Signed)
Description   Spoke with patient. Instructed her to eat a salad (1.5 cups of romaine) this afternoon and then continue same dosage 5mg  daily except 7.5mg  on Mondays and Fridays.  Does weekly checks due to insurance. Recheck in INR in 1 week.

## 2020-06-02 ENCOUNTER — Ambulatory Visit (INDEPENDENT_AMBULATORY_CARE_PROVIDER_SITE_OTHER): Payer: Medicare Other | Admitting: Cardiovascular Disease

## 2020-06-02 DIAGNOSIS — Z7901 Long term (current) use of anticoagulants: Secondary | ICD-10-CM

## 2020-06-02 DIAGNOSIS — Z952 Presence of prosthetic heart valve: Secondary | ICD-10-CM | POA: Diagnosis not present

## 2020-06-02 DIAGNOSIS — I69359 Hemiplegia and hemiparesis following cerebral infarction affecting unspecified side: Secondary | ICD-10-CM

## 2020-06-02 DIAGNOSIS — Z5181 Encounter for therapeutic drug level monitoring: Secondary | ICD-10-CM

## 2020-06-02 LAB — POCT INR: INR: 4.5 — AB (ref 2.0–3.0)

## 2020-06-02 NOTE — Patient Instructions (Signed)
Description   Spoke with patient. Instructed her to skip warfarin today, then continue same dosage 5mg  daily except 7.5mg  on Mondays and Fridays.  Does weekly checks due to insurance. Recheck in INR in 1 week.

## 2020-06-09 ENCOUNTER — Ambulatory Visit (INDEPENDENT_AMBULATORY_CARE_PROVIDER_SITE_OTHER): Payer: Medicare Other | Admitting: Internal Medicine

## 2020-06-09 ENCOUNTER — Telehealth: Payer: Self-pay | Admitting: Neurology

## 2020-06-09 DIAGNOSIS — Z5181 Encounter for therapeutic drug level monitoring: Secondary | ICD-10-CM

## 2020-06-09 LAB — POCT INR: INR: 3.1 — AB (ref 2.0–3.0)

## 2020-06-09 NOTE — Telephone Encounter (Signed)
This patient underwent a driver's assessment on 04 June 2020.  It was felt that the patient was inconsistent behind the wheel, she was quite ready to return to independent driving.  She had fluctuating speech control, inconsistent lane position and some difficulty with executing decisions in a timely manner.  5 hours of behind the wheel instruction was recommended to see if some of the issues can be resolved.  The patient should minimize distractions when driving such as playing music or having multiple passengers in the car.

## 2020-06-09 NOTE — Patient Instructions (Signed)
Description   Spoke with patient. Instructed her to continue same dosage 5mg  daily except 7.5mg  on Mondays and Fridays.  Does weekly checks due to insurance. Recheck in INR in 1 week.

## 2020-06-16 ENCOUNTER — Ambulatory Visit (INDEPENDENT_AMBULATORY_CARE_PROVIDER_SITE_OTHER): Payer: Medicare Other | Admitting: *Deleted

## 2020-06-16 DIAGNOSIS — I69359 Hemiplegia and hemiparesis following cerebral infarction affecting unspecified side: Secondary | ICD-10-CM

## 2020-06-16 DIAGNOSIS — Z5181 Encounter for therapeutic drug level monitoring: Secondary | ICD-10-CM

## 2020-06-16 DIAGNOSIS — Z7901 Long term (current) use of anticoagulants: Secondary | ICD-10-CM

## 2020-06-16 DIAGNOSIS — Z952 Presence of prosthetic heart valve: Secondary | ICD-10-CM

## 2020-06-16 LAB — POCT INR: INR: 2.7 (ref 2.0–3.0)

## 2020-06-16 NOTE — Patient Instructions (Signed)
Description   Spoke with patient and instructed pt to take 10mg today then continue same dosage 5mg daily except 7.5mg on Mondays and Friday. Does weekly checks due to insurance. Recheck in INR in 1 week.     

## 2020-06-20 ENCOUNTER — Ambulatory Visit: Payer: Medicare Other | Admitting: Cardiovascular Disease

## 2020-06-23 ENCOUNTER — Ambulatory Visit (INDEPENDENT_AMBULATORY_CARE_PROVIDER_SITE_OTHER): Payer: Medicare Other | Admitting: Cardiovascular Disease

## 2020-06-23 DIAGNOSIS — Z5181 Encounter for therapeutic drug level monitoring: Secondary | ICD-10-CM

## 2020-06-23 DIAGNOSIS — Z7901 Long term (current) use of anticoagulants: Secondary | ICD-10-CM | POA: Diagnosis not present

## 2020-06-23 DIAGNOSIS — Z952 Presence of prosthetic heart valve: Secondary | ICD-10-CM

## 2020-06-23 DIAGNOSIS — I69359 Hemiplegia and hemiparesis following cerebral infarction affecting unspecified side: Secondary | ICD-10-CM

## 2020-06-23 LAB — POCT INR: INR: 4.5 — AB (ref 2.0–3.0)

## 2020-06-23 NOTE — Patient Instructions (Signed)
Description   Spoke with patient and instructed pt to take 10mg  today then continue same dosage 5mg  daily except 7.5mg  on Mondays and Friday. Does weekly checks due to insurance. Recheck in INR in 1 week.

## 2020-06-24 ENCOUNTER — Telehealth: Payer: Self-pay | Admitting: Cardiovascular Disease

## 2020-06-24 NOTE — Telephone Encounter (Signed)
This INR result of 4.5 was taken care of on yesterday, 06/23/2020, when the pt obtained the result and called the clinic. Please refer to Anticoagulation Encounter for details.

## 2020-06-24 NOTE — Telephone Encounter (Signed)
New message:    [name] from [facility] to report

## 2020-06-24 NOTE — Telephone Encounter (Signed)
Call was disconnected upon transfer.  I returned call and spoke with Jarrett Soho at [facility].  Per Jarrett Soho patient had out of range INR of 4.5.  This is being faxed to [phone]. Will forward to coumadin clinic

## 2020-06-30 ENCOUNTER — Ambulatory Visit (INDEPENDENT_AMBULATORY_CARE_PROVIDER_SITE_OTHER): Payer: Medicare Other | Admitting: *Deleted

## 2020-06-30 DIAGNOSIS — Z7901 Long term (current) use of anticoagulants: Secondary | ICD-10-CM | POA: Diagnosis not present

## 2020-06-30 DIAGNOSIS — I69359 Hemiplegia and hemiparesis following cerebral infarction affecting unspecified side: Secondary | ICD-10-CM

## 2020-06-30 DIAGNOSIS — Z952 Presence of prosthetic heart valve: Secondary | ICD-10-CM | POA: Diagnosis not present

## 2020-06-30 DIAGNOSIS — Z5181 Encounter for therapeutic drug level monitoring: Secondary | ICD-10-CM | POA: Diagnosis not present

## 2020-06-30 LAB — POCT INR: INR: 3.2 — AB (ref 2.0–3.0)

## 2020-07-01 ENCOUNTER — Other Ambulatory Visit: Payer: Self-pay

## 2020-07-01 ENCOUNTER — Inpatient Hospital Stay (HOSPITAL_COMMUNITY)
Admission: EM | Admit: 2020-07-01 | Discharge: 2020-07-04 | DRG: 065 | Disposition: A | Discharge status: Rehabilitation Facility | Payer: Medicare Other | Attending: Internal Medicine | Admitting: Internal Medicine

## 2020-07-01 ENCOUNTER — Emergency Department (HOSPITAL_COMMUNITY): Payer: Medicare Other

## 2020-07-01 ENCOUNTER — Encounter (HOSPITAL_COMMUNITY): Payer: Self-pay | Admitting: *Deleted

## 2020-07-01 DIAGNOSIS — Z8249 Family history of ischemic heart disease and other diseases of the circulatory system: Secondary | ICD-10-CM | POA: Diagnosis not present

## 2020-07-01 DIAGNOSIS — G9389 Other specified disorders of brain: Secondary | ICD-10-CM | POA: Diagnosis present

## 2020-07-01 DIAGNOSIS — I6523 Occlusion and stenosis of bilateral carotid arteries: Secondary | ICD-10-CM | POA: Diagnosis not present

## 2020-07-01 DIAGNOSIS — K219 Gastro-esophageal reflux disease without esophagitis: Secondary | ICD-10-CM | POA: Diagnosis present

## 2020-07-01 DIAGNOSIS — Z7189 Other specified counseling: Secondary | ICD-10-CM | POA: Diagnosis not present

## 2020-07-01 DIAGNOSIS — Z90710 Acquired absence of both cervix and uterus: Secondary | ICD-10-CM | POA: Diagnosis not present

## 2020-07-01 DIAGNOSIS — Z823 Family history of stroke: Secondary | ICD-10-CM

## 2020-07-01 DIAGNOSIS — K5900 Constipation, unspecified: Secondary | ICD-10-CM | POA: Diagnosis not present

## 2020-07-01 DIAGNOSIS — I639 Cerebral infarction, unspecified: Principal | ICD-10-CM

## 2020-07-01 DIAGNOSIS — R4189 Other symptoms and signs involving cognitive functions and awareness: Secondary | ICD-10-CM | POA: Diagnosis present

## 2020-07-01 DIAGNOSIS — Z7982 Long term (current) use of aspirin: Secondary | ICD-10-CM | POA: Diagnosis not present

## 2020-07-01 DIAGNOSIS — Z515 Encounter for palliative care: Secondary | ICD-10-CM | POA: Diagnosis not present

## 2020-07-01 DIAGNOSIS — R471 Dysarthria and anarthria: Secondary | ICD-10-CM | POA: Diagnosis present

## 2020-07-01 DIAGNOSIS — I69398 Other sequelae of cerebral infarction: Secondary | ICD-10-CM | POA: Diagnosis not present

## 2020-07-01 DIAGNOSIS — Z888 Allergy status to other drugs, medicaments and biological substances status: Secondary | ICD-10-CM | POA: Diagnosis not present

## 2020-07-01 DIAGNOSIS — Z83438 Family history of other disorder of lipoprotein metabolism and other lipidemia: Secondary | ICD-10-CM

## 2020-07-01 DIAGNOSIS — R29701 NIHSS score 1: Secondary | ICD-10-CM | POA: Diagnosis present

## 2020-07-01 DIAGNOSIS — I10 Essential (primary) hypertension: Secondary | ICD-10-CM | POA: Diagnosis not present

## 2020-07-01 DIAGNOSIS — E8881 Metabolic syndrome: Secondary | ICD-10-CM | POA: Diagnosis present

## 2020-07-01 DIAGNOSIS — E785 Hyperlipidemia, unspecified: Secondary | ICD-10-CM | POA: Diagnosis not present

## 2020-07-01 DIAGNOSIS — I69359 Hemiplegia and hemiparesis following cerebral infarction affecting unspecified side: Secondary | ICD-10-CM

## 2020-07-01 DIAGNOSIS — Z20822 Contact with and (suspected) exposure to covid-19: Secondary | ICD-10-CM | POA: Diagnosis present

## 2020-07-01 DIAGNOSIS — I69354 Hemiplegia and hemiparesis following cerebral infarction affecting left non-dominant side: Secondary | ICD-10-CM

## 2020-07-01 DIAGNOSIS — R42 Dizziness and giddiness: Secondary | ICD-10-CM

## 2020-07-01 DIAGNOSIS — I440 Atrioventricular block, first degree: Secondary | ICD-10-CM | POA: Diagnosis not present

## 2020-07-01 DIAGNOSIS — Z7901 Long term (current) use of anticoagulants: Secondary | ICD-10-CM | POA: Diagnosis not present

## 2020-07-01 DIAGNOSIS — H8110 Benign paroxysmal vertigo, unspecified ear: Secondary | ICD-10-CM | POA: Diagnosis not present

## 2020-07-01 DIAGNOSIS — Z87440 Personal history of urinary (tract) infections: Secondary | ICD-10-CM

## 2020-07-01 DIAGNOSIS — R7303 Prediabetes: Secondary | ICD-10-CM | POA: Diagnosis present

## 2020-07-01 DIAGNOSIS — R2689 Other abnormalities of gait and mobility: Secondary | ICD-10-CM | POA: Diagnosis present

## 2020-07-01 DIAGNOSIS — Z79899 Other long term (current) drug therapy: Secondary | ICD-10-CM

## 2020-07-01 DIAGNOSIS — Z03818 Encounter for observation for suspected exposure to other biological agents ruled out: Secondary | ICD-10-CM | POA: Diagnosis not present

## 2020-07-01 DIAGNOSIS — I361 Nonrheumatic tricuspid (valve) insufficiency: Secondary | ICD-10-CM | POA: Diagnosis not present

## 2020-07-01 DIAGNOSIS — Z952 Presence of prosthetic heart valve: Secondary | ICD-10-CM | POA: Diagnosis not present

## 2020-07-01 HISTORY — DX: Essential (primary) hypertension: I10

## 2020-07-01 HISTORY — DX: Gastro-esophageal reflux disease without esophagitis: K21.9

## 2020-07-01 LAB — BASIC METABOLIC PANEL
Anion gap: 10 (ref 5–15)
BUN: 20 mg/dL (ref 8–23)
CO2: 27 mmol/L (ref 22–32)
Calcium: 10.2 mg/dL (ref 8.9–10.3)
Chloride: 105 mmol/L (ref 98–111)
Creatinine, Ser: 0.77 mg/dL (ref 0.44–1.00)
GFR calc Af Amer: >60 mL/min (ref >60)
GFR calc non Af Amer: >60 mL/min (ref >60)
Glucose, Bld: 139 mg/dL — ABNORMAL HIGH (ref 70–99)
Potassium: 3.8 mmol/L (ref 3.5–5.1)
Sodium: 142 mmol/L (ref 135–145)

## 2020-07-01 LAB — CBC
HCT: 42.9 % (ref 36.0–46.0)
Hemoglobin: 13.6 g/dL (ref 12.0–15.0)
MCH: 29.2 pg (ref 26.0–34.0)
MCHC: 31.7 g/dL (ref 30.0–36.0)
MCV: 92.1 fL (ref 80.0–100.0)
Platelets: 188 10*3/uL (ref 150–400)
RBC: 4.66 MIL/uL (ref 3.87–5.11)
RDW: 13.3 % (ref 11.5–15.5)
WBC: 4.9 10*3/uL (ref 4.0–10.5)
nRBC: 0 % (ref 0.0–0.2)

## 2020-07-01 LAB — URINALYSIS, ROUTINE W REFLEX MICROSCOPIC
Bilirubin Urine: NEGATIVE
Glucose, UA: NEGATIVE mg/dL
Hgb urine dipstick: NEGATIVE
Ketones, ur: NEGATIVE mg/dL
Leukocytes,Ua: NEGATIVE
Nitrite: NEGATIVE
Protein, ur: NEGATIVE mg/dL
Specific Gravity, Urine: 1.008 (ref 1.005–1.030)
pH: 6 (ref 5.0–8.0)

## 2020-07-01 LAB — SARS CORONAVIRUS 2 BY RT PCR (HOSPITAL ORDER, PERFORMED IN ~~LOC~~ HOSPITAL LAB): SARS Coronavirus 2: NEGATIVE

## 2020-07-01 NOTE — ED Notes (Signed)
Patient transported to MRI 

## 2020-07-01 NOTE — ED Provider Notes (Signed)
[facility] EMERGENCY DEPARTMENT Provider Note   CSN: [id number] Arrival date & time: 07/01/20  0548     History Chief Complaint  Patient presents with  . Dizziness  . Weakness    [name] is a 74 y.o. female.  [name] is a 74 y.o. female with hx of multiple strokes with chronic , MV replacement, DM, HLD, who presents with dizziness that has been present for 3 days. She says she feels off balance and like she is going to fall over. She has had increased difficulty with walking. Denies any new visual changes, wears glasses at baseline. Denies changes in speech or facial asymmetry. Has some chronic left sided weakness from previous strokes which is unchanged. No headaches. No recent falls or injury. Denies any associated fevers or chills.  No chest pain or shortness of breath.  No abdominal pain.  Patient states that she was recently treated for UTI and was having some dysuria but this has since resolved.  Patient's most recent stroke was back in October and she did have some dizziness with this but her primary symptoms were left-sided weakness.  Dizziness over the past 3 days has felt different and much worse.  No other aggravating or alleviating factors.        Past Medical History:  Diagnosis Date  . Abnormality of gait 09/07/2016  . Allergy   . Diabetes mellitus without complication [facility])    Patient denies this - notes history of glucose intolerance  . 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 History of pneumonia 1997  . S/P MVR (mitral valve replacement)    Mechanical mitral valve replacement at age 49 (done in [place])  // echo 7/17: EF 55-60, normal wall motion, bileaflet mechanical mitral valve prosthesis functioning normally, mild LAE, mildly reduced RVSF, small pericardial effusion  . Stroke [facility]) 1997, 2013, 2015    Patient Active Problem List   Diagnosis Date Noted  . 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 Anticoagulated on warfarin   . Left hemiparesis ([place])   . History of CVA with residual deficit   . Thrombocytopenia ([place])   . Prediabetes   . CVA (cerebral vascular accident) ([place]) 09/03/2019  . Vertigo 09/24/2017  . Benign paroxysmal positional vertigo   . History of stroke   . Hyperlipemia 08/23/2017  . Encounter for therapeutic drug monitoring 08/08/2017  . Long term (current) use of anticoagulants [Z79.01] 07/12/2017  . Hemiparesis and alteration of sensations as late effects of stroke ([place]) 09/07/2016  . Abnormality of gait 09/07/2016  . HTN (hypertension) 04/13/2016  . H/O mitral valve replacement with mechanical valve 10/16/2015  . Diabetes mellitus without complication ([place])   . Hemiparesis (L sided - mild) due to old stroke [facility])     Past Surgical History:  Procedure Laterality Date  . ABDOMINAL HYSTERECTOMY    . BRAIN SURGERY    . BUNIONECTOMY  1993  . CARDIAC VALVE REPLACEMENT  1997  . TOE SURGERY  1996     OB History   No obstetric history on file.     Family History  Problem Relation Age of Onset  . Cancer Mother        Bone  . Heart disease Mother   . Hyperlipidemia Mother   . Hypertension Mother   . Stroke Father   . Hypertension Father   . Heart attack Neg Hx     Social History   Tobacco  Use  . Smoking status: Never Smoker  . Smokeless tobacco: Never Used  Vaping Use  . Vaping Use: Never used  Substance Use Topics  . Alcohol use: No    Alcohol/week: 0.0 standard drinks  . Drug use: No    Home Medications Prior to Admission medications   Medication Sig Start Date End Date Taking? Authorizing Provider  acetaminophen (TYLENOL) 325 MG tablet Take 2 tablets (650 mg total) by mouth every 4 (four) hours as needed for mild pain (or temp > 37.5 C (99.5 F)). Patient taking differently: Take 650 mg by mouth daily.  09/14/19  Yes Angiulli, Lavon Paganini, PA-C  amLODipine (NORVASC) 10 MG tablet Take 1 tablet (10 mg total) by mouth  daily. Patient taking differently: Take 10 mg by mouth at bedtime.  09/14/19  Yes Angiulli, Lavon Paganini, PA-C  amoxicillin (AMOXIL) 500 MG capsule Take 2,000 mg by mouth as needed. 1 hour before appointment  02/18/20  Yes [provider]  aspirin EC 81 MG tablet Take 81 mg by mouth daily.    Yes [provider]  atorvastatin (LIPITOR) 20 MG tablet Take 1 tablet (20 mg total) by mouth daily. Patient taking differently: Take 20 mg by mouth at bedtime.  09/14/19  Yes Angiulli, Lavon Paganini, PA-C  calcium carbonate 1250 MG capsule Take 1,200 mg by mouth at bedtime.    Yes [provider]  cholecalciferol (VITAMIN D) 1000 UNITS tablet Take 2,000 Units daily by mouth.    Yes [provider]  docusate sodium (COLACE) 100 MG capsule Take 100 mg at bedtime by mouth.    Yes [provider]  Menthol, Topical Analgesic, (BENGAY EX) Apply 1 application topically daily. All painful areas   Yes [provider]  omeprazole (PRILOSEC) 20 MG capsule Take 20 mg by mouth at bedtime.    Yes [provider]  Propylene Glycol-Glycerin (SOOTHE) 0.6-0.6 % SOLN Place 1 drop into both eyes 2 (two) times daily as needed (dry eyes).   Yes [provider]  warfarin (COUMADIN) 5 MG tablet TAKE BY MOUTH AS DIRECTED BY THE COUMADIN CLINIC FOR 30 DAYS. Patient taking differently: Take 5-7.5 mg by mouth See admin instructions. 7.5mg  on MON and FRI and 5mg  on all other days. 04/08/20  Yes Nahser, Wonda Cheng, MD    Allergies    Zoloft [sertraline]  Review of Systems   Review of Systems  Constitutional: Negative for chills and fever.  Respiratory: Negative for cough and shortness of breath.   Cardiovascular: Negative for chest pain and palpitations.  Gastrointestinal: Negative for abdominal pain, diarrhea, nausea and vomiting.  Genitourinary: Negative for dysuria and frequency.  Musculoskeletal: Negative for arthralgias, back pain and neck pain.  Skin: Negative for  color change and rash.  Neurological: Positive for dizziness and weakness (Chronic left sided weakness). Negative for syncope, facial asymmetry, light-headedness, numbness and headaches.    Physical Exam Updated Vital Signs BP 132/86 (BP Location: Right Arm)   Pulse 76   Temp 98.2 F (36.8 C) (Oral)   Resp 18   Ht 5\' 7"  (1.702 m)   Wt 60.3 kg   SpO2 96%   BMI 20.83 kg/m   Physical Exam Vitals and nursing note reviewed.  Constitutional:      General: She is not in acute distress.    Appearance: Normal appearance. She is well-developed. She is not ill-appearing or diaphoretic.     Comments: Elderly female, well-appearing and in no distress  HENT:  Head: Normocephalic and atraumatic.  Eyes:     General:        Right eye: No discharge.        Left eye: No discharge.     Pupils: Pupils are equal, round, and reactive to light.  Cardiovascular:     Rate and Rhythm: Normal rate and regular rhythm.     Pulses: Normal pulses.     Heart sounds: Normal heart sounds. No murmur heard.  No friction rub. No gallop.   Pulmonary:     Effort: Pulmonary effort is normal. No respiratory distress.     Breath sounds: Normal breath sounds. No wheezing or rales.     Comments: Respirations equal and unlabored, patient able to speak in full sentences, lungs clear to auscultation bilaterally Abdominal:     General: Bowel sounds are normal. There is no distension.     Palpations: Abdomen is soft. There is no mass.     Tenderness: There is no abdominal tenderness. There is no guarding.     Comments: Abdomen soft, nondistended, nontender to palpation in all quadrants without guarding or peritoneal signs  Musculoskeletal:        General: No deformity.     Cervical back: Neck supple.     Right lower leg: No edema.     Left lower leg: No edema.  Skin:    General: Skin is warm and dry.     Capillary Refill: Capillary refill takes less than 2 seconds.  Neurological:     Mental Status: She is  alert.     Coordination: Coordination normal.     Comments: Speech is clear, able to follow commands CN III-XII intact Left upper and lower extremity with 4/5 strength with slight contracture of the left upper extremity noted, 5/5 strength in right upper and lower extremity Sensation normal to light and sharp touch bilaterally Patient able to move all extremities but denies have some pronator drift on the left. Normal finger-to-nose testing bilaterally.  Psychiatric:        Mood and Affect: Mood normal.        Behavior: Behavior normal.     ED Results / Procedures / Treatments   Labs (all labs ordered are listed, but only abnormal results are displayed) Labs Reviewed  BASIC METABOLIC PANEL - Abnormal; Notable for the following components:      Result Value   Glucose, Bld 139 (*)    All other components within normal limits  URINALYSIS, ROUTINE W REFLEX MICROSCOPIC - Abnormal; Notable for the following components:   Color, Urine STRAW (*)    All other components within normal limits  SARS CORONAVIRUS 2 BY RT PCR (HOSPITAL ORDER, [place] [facility])  CBC  PROTIME-INR    EKG EKG Interpretation  Date/Time:  Tuesday July 01 2020 05:54:36 EDT Ventricular Rate:  78 PR Interval:  244 QRS Duration: 74 QT Interval:  364 QTC Calculation: 414 R Axis:   -48 Text Interpretation: Sinus rhythm with 1st degree A-V block with Fusion complexes Left axis deviation Low voltage QRS Possible Anterolateral infarct , age undetermined Abnormal ECG since last tracing no significant change Confirmed by Noemi Chapel [phone]) on 07/01/2020 1:20:30 PM   Radiology MR BRAIN WO CONTRAST  Result Date: 07/01/2020 CLINICAL DATA:  Initial evaluation for neuro deficit, stroke suspected. EXAM: MRI HEAD WITHOUT CONTRAST TECHNIQUE: Multiplanar, multiecho pulse sequences of the brain and surrounding structures were obtained without intravenous contrast. COMPARISON:  Prior Study from  09/03/2019. FINDINGS:  Brain: Moderate to advanced generalized age-related cerebral atrophy. Patchy and confluent T2/FLAIR hyperintensity within the periventricular deep white matter both cerebral hemispheres most consistent with chronic small vessel ischemic disease, moderate in nature. Multiple scattered remote lacunar infarcts noted involving the left thalamus. Multiple scattered small remote bilateral cerebellar infarcts noted as well. Large area of encephalomalacia and gliosis involving the cortical and subcortical right parietal lobe again noted. Associated scattered areas of chronic hemosiderin staining. Sequelae of overlying right craniotomy. Few scattered foci of diffusion abnormality within this region favored to be related to susceptibility artifact rather than acute ischemia. Single small curvilinear cortical subcortical ischemic infarcts seen involving the parasagittal posterior left frontal lobe (series 2, images 43, 40, 39). No associated hemorrhage or mass effect. No other convincing foci of restricted diffusion to suggest acute or subacute ischemia. Gray-white matter differentiation otherwise maintained. No acute intracranial hemorrhage. Additional single punctate chronic microhemorrhage noted within the left temporal lobe. No mass lesion, midline shift or mass effect. Mild ventricular prominence related global parenchymal volume loss without hydrocephalus. Mild ex vacuo dilatation of the right lateral ventricle related to the right parietal encephalomalacia noted as well. No extra-axial fluid collection. Pituitary gland suprasellar region normal. Midline structures intact. Vascular: Major intracranial vascular flow voids are maintained. Skull and upper cervical spine: Craniocervical junction within normal limits. Bone marrow signal intensity normal. Remote right parietal craniotomy noted. No acute scalp soft tissue abnormality. Sinuses/Orbits: Globes and orbital soft tissues within normal limits.  Paranasal sinuses are largely clear. No significant mastoid effusion. Other: None. IMPRESSION: 1. Single small curvilinear cortical to subcortical ischemic nonhemorrhagic infarct involving the parasagittal posterior left frontal lobe. 2. No other acute intracranial abnormality. 3. Chronic right parietal encephalomalacia and gliosis with associated chronic hemosiderin staining and sequelae of overlying right parietal craniotomy, stable. 4. Underlying moderate to advanced age-related cerebral atrophy with chronic microvascular ischemic disease, with multiple remote lacunar infarcts involving the left thalamus and bilateral cerebellar hemispheres. Electronically Signed   By: Jeannine Boga M.D.   On: 07/01/2020 20:51    Procedures Procedures (including critical care time)  Medications Ordered in ED Medications - No data to display  ED Course  I have reviewed the triage vital signs and the nursing notes.  Pertinent labs & imaging results that were available during my care of the patient were reviewed by me and considered in my medical decision making (see chart for details).    MDM Rules/Calculators/A&P                          74 year old female presents with 3 days of dizziness, balance issues and disequilibrium, she has a history of multiple previous strokes, no known cerebellar strokes.  Has some chronic left-sided weakness related to previous strokes which is unchanged, no associated numbness, facial droop, visual changes or changes in speech.  No associated headaches.  Patient does not describe any syncopal symptoms has not had any chest pain, shortness of breath or palpitations, no fevers or recent illness aside from recent UTI which appears resolved based on urinalysis done from triage.  Basic labs from triage show mild hyperglycemia but no other acute electrolyte derangements, no leukocytosis.  EKG with normal sinus rhythm with first degree AV block, but no acute changes when compared to  previous.  Given symptoms present with history of multiple strokes.  Patient will need MRI of the brain to assess for potential cerebellar stroke.  On exam she has some slight pronator drift  in the right hand it is difficult to tell if this is due to weakness present at baseline.  MRI shows a new acute small curvilinear infarct involving the parasagittal posterior left frontal lobe.  No other acute intercranial abnormalities.  Chronic right parietal encephalomalacia and gliosis from previous stroke.  I discussed case with Dr. Rory Percy with neurology, he is concerned for continued acute strokes despite anticoagulation.  INR yesterday was 3.2, patient states her window is usually between 3-3.5.  Will recheck INR tonight, patient may need to be evaluated for other anticoagulation options.  Neurology request medicine admission, they will see the patient in consult.  I updated patient and called and updated her husband on MRI findings and plan for admission.  Case discussed with Dr. Tonie Griffith with [facility] hospitalist who will see and admit the patient.    Final Clinical Impression(s) / ED Diagnoses Final diagnoses:  Cerebrovascular accident (CVA), unspecified mechanism ([place])  Dizziness    Rx / DC Orders ED Discharge Orders    None       Janet Berlin 07/01/20 2157    Noemi Chapel, MD 07/02/20 0700

## 2020-07-01 NOTE — Progress Notes (Signed)
ANTICOAGULATION CONSULT NOTE - Initial Consult  Pharmacy Consult for Warfarin Indication: mechanical MVR  Allergies  Allergen Reactions  . Zoloft [Sertraline] Hives, Itching and Rash    Patient Measurements: Height: 5\' 7"  (170.2 cm) Weight: 60.3 kg (133 lb) IBW/kg (Calculated) : 61.6  Vital Signs: Temp: 98.2 F (36.8 C) (08/24 1308) Temp Source: Oral (08/24 1308) BP: 139/82 (08/24 2200) Pulse Rate: 73 (08/24 2200)  Labs: Recent Labs    06/30/20 0000 07/01/20 0612  HGB  --  13.6  HCT  --  42.9  PLT  --  188  INR 3.2*  --   CREATININE  --  0.77    Estimated Creatinine Clearance: 58.7 mL/min (by C-G formula based on SCr of 0.77 mg/dL).   Medical History: Past Medical History:  Diagnosis Date  . Abnormality of gait 09/07/2016  . Allergy   . Diabetes mellitus without complication [facility])    Patient denies this - notes history of glucose intolerance  . GERD (gastroesophageal reflux disease)   . Hemiparesis and alteration of sensations as late effects of stroke ([place]) 09/07/2016  . History of pneumonia 1997  . Hypertension   . S/P MVR (mitral valve replacement)    Mechanical mitral valve replacement at age 24 (done in [place])  // echo 7/17: EF 55-60, normal wall motion, bileaflet mechanical mitral valve prosthesis functioning normally, mild LAE, mildly reduced RVSF, small pericardial effusion  . Stroke [facility]) 1997, 2013, 2015    Medications:  See electronic med rec  Assessment: 74 y.o. presents with dizziness, now with acute ischemic stroke Pt on warfarin PTA for mechanical MVR and h/o hemispheric strokes. INR 8/23 0000 was therapeutic (3.2). Admission INR pending. CBC ok on admission. Neurology agrees with continuing coumadin due benefits for heart valve outweigh due to low risk for hemorrhagic transformation with small stroke size. Home dose: Warfarin 5mg  except for 7.5mg  Mon and Fri  Goal of Therapy:  INR goal 3-3.5 Monitor platelets by anticoagulation protocol:  Yes   Plan:  Daily INR  Sherlon Handing, PharmD, BCPS Please see amion for complete clinical pharmacist phone list 07/02/2020 1:37 AM

## 2020-07-01 NOTE — ED Triage Notes (Signed)
To ED for eval of dizziness and weakness for past few days. Not getting any better so came to ED this am. Hx of 6 strokes with left side residual - weakness in arm and leg. Pt states the dizzness with any movement. Alert and oriented. Speech is clear. No HA. Pupils equal.

## 2020-07-01 NOTE — Consult Note (Signed)
Neurology Consultation  Reason for Consult: Stroke Referring Physician: Noemi Chapel, MD-EDP/Kelsey Marijean Bravo, ED, PA-C  CC: Dizziness  History is obtained from: Patient herself in the chart.  HPI: [name] is a 74 y.o. 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 Reports feeling lightheaded and dizzy, similar to her prior strokes but did not have any tingling numbness or weakness.  Denies any headaches.  Denies any visual symptoms. Came to the emergency room because her prior strokes had similar sort of presentation where she is just felt dizzy without focal weakness-anything more than her hemiparesis from prior strokes. MRI brain was performed in the emergency room that showed cortical areas of acute ischemic stroke in the left hemisphere. Neurological consultation obtained for further work-up.  Denies sick contacts.  Denies chest pain shortness of breath.  Denies palpitations.  Denies fevers chills.  Denies nausea vomiting.  Denies any slurred speech but says that she has been having some trouble in getting her words out-as an example when she wanted to say stop, instead of STOP she said SLOP   LKW: 3 days ago tpa given?: no, outside the window and on Coumadin Premorbid modified Rankin scale (mRS): 2  ROS: Obtained and negative except as noted in HPI  Past Medical History:  Diagnosis Date  . Abnormality of gait 09/07/2016  . Allergy   . Diabetes mellitus without complication [facility])    Patient denies this - notes history of glucose intolerance  . Hemiparesis and alteration of sensations as late effects of stroke ([place]) 09/07/2016  . History of pneumonia 1997  . S/P MVR (mitral valve replacement)    Mechanical mitral valve replacement at age 50 (done in [place])  // echo 7/17: EF 55-60, normal wall  motion, bileaflet mechanical mitral valve prosthesis functioning normally, mild LAE, mildly reduced RVSF, small pericardial effusion  . Stroke [facility]) 1997, 2013, 2015     Family History  Problem Relation Age of Onset  . Cancer Mother        Bone  . Heart disease Mother   . Hyperlipidemia Mother   . Hypertension Mother   . Stroke Father   . Hypertension Father   . Heart attack Neg Hx     Social History:   reports that she has never smoked. She has never used smokeless tobacco. She reports that she does not drink alcohol and does not use drugs.  She is a retired Oncologist. She was born in [place].  Lived most of her life in [place].  Moved to the area to be closer to son after retirement.  Medications No current facility-administered medications for this encounter.  Current Outpatient Medications:  .  acetaminophen (TYLENOL) 325 MG tablet, Take 2 tablets (650 mg total) by mouth every 4 (four) hours as needed for mild pain (or temp > 37.5 C (99.5 F)). (Patient taking differently: Take 650 mg by mouth daily. ), Disp:  , Rfl:  .  amLODipine (NORVASC) 10 MG tablet, Take 1 tablet (10 mg total) by mouth daily. (Patient taking differently: Take 10 mg by mouth at bedtime. ), Disp: 30 tablet, Rfl: 0 .  amoxicillin (AMOXIL) 500 MG capsule, Take 2,000 mg by mouth as needed. 1 hour before appointment , Disp: , Rfl:  .  aspirin EC 81 MG tablet, Take 81 mg by mouth daily. , Disp: ,  Rfl:  .  atorvastatin (LIPITOR) 20 MG tablet, Take 1 tablet (20 mg total) by mouth daily. (Patient taking differently: Take 20 mg by mouth at bedtime. ), Disp: 30 tablet, Rfl: 0 .  calcium carbonate 1250 MG capsule, Take 1,200 mg by mouth at bedtime. , Disp: , Rfl:  .  cholecalciferol (VITAMIN D) 1000 UNITS tablet, Take 2,000 Units daily by mouth. , Disp: , Rfl:  .  docusate sodium (COLACE) 100 MG capsule, Take 100 mg at bedtime by mouth. , Disp: , Rfl:  .  Menthol, Topical Analgesic, (BENGAY EX),  Apply 1 application topically daily. All painful areas, Disp: , Rfl:  .  omeprazole (PRILOSEC) 20 MG capsule, Take 20 mg by mouth at bedtime. , Disp: , Rfl:  .  Propylene Glycol-Glycerin (SOOTHE) 0.6-0.6 % SOLN, Place 1 drop into both eyes 2 (two) times daily as needed (dry eyes)., Disp: , Rfl:  .  warfarin (COUMADIN) 5 MG tablet, TAKE BY MOUTH AS DIRECTED BY THE COUMADIN CLINIC FOR 30 DAYS. (Patient taking differently: Take 5-7.5 mg by mouth See admin instructions. 7.5mg  on MON and FRI and 5mg  on all other days.), Disp: 45 tablet, Rfl: 3   Exam: Current vital signs: BP 122/75   Pulse 72   Temp 98.2 F (36.8 C) (Oral)   Resp (!) 22   Ht 5\' 7"  (1.702 m)   Wt 60.3 kg   SpO2 97%   BMI 20.83 kg/m  Vital signs in last 24 hours: Temp:  [97.3 F (36.3 C)-98.4 F (36.9 C)] 98.2 F (36.8 C) (08/24 1308) Pulse Rate:  [54-89] 72 (08/24 1930) Resp:  [11-22] 22 (08/24 1930) BP: (115-150)/(59-91) 122/75 (08/24 1930) SpO2:  [95 %-97 %] 97 % (08/24 1930) Weight:  [60.3 kg] 60.3 kg (08/24 0558) General: Awake alert in no distress HEENT: Cephalic atraumatic Lungs: Clear Cardiovascular: Regular rhythm Abdomen soft nondistended nontender Extremities warm well perfused Neurological exam Awake alert oriented x3 Mildly dysarthric speech No aphasia on the brief clinical encounter but reports some word finding difficulty/word substitutions off-and-on Cranial nerves: Pupils equal round react light, extraocular movements intact, visual field full, face symmetric, facial sensation intact, auditory acuity mildly reduced bilaterally, shoulder shrug intact, tongue and palate midline. Motor exam: There is mild increased tone in the left upper extremity with full strength on flexion and 4/5 extension.  Left lower extremity hip flexion is also 4+/5.  Left foot drop that is chronic and unchanged. Right side full-strength and normal tone. Coordination: No dysmetria Sensory exam: Intact to light touch all  over NIH stroke scale: 1 for dysarthria   Labs I have reviewed labs in epic and the results pertinent to this consultation are:   CBC    Component Value Date/Time   WBC 4.9 07/01/2020 0612   RBC 4.66 07/01/2020 0612   HGB 13.6 07/01/2020 0612   HCT 42.9 07/01/2020 0612   PLT 188 07/01/2020 0612   MCV 92.1 07/01/2020 0612   MCH 29.2 07/01/2020 0612   MCHC 31.7 07/01/2020 0612   RDW 13.3 07/01/2020 0612   LYMPHSABS 1.4 09/06/2019 0533   MONOABS 0.2 09/06/2019 0533   EOSABS 0.1 09/06/2019 0533   BASOSABS 0.0 09/06/2019 0533    CMP     Component Value Date/Time   NA 142 07/01/2020 0612   K 3.8 07/01/2020 0612   CL 105 07/01/2020 0612   CO2 27 07/01/2020 0612   GLUCOSE 139 (H) 07/01/2020 0612   BUN 20 07/01/2020 0612   CREATININE 0.77  07/01/2020 0612   CALCIUM 10.2 07/01/2020 0612   PROT 6.3 (L) 09/06/2019 0533   ALBUMIN 3.7 09/06/2019 0533   AST 21 09/06/2019 0533   ALT 16 09/06/2019 0533   ALKPHOS 51 09/06/2019 0533   BILITOT 0.4 09/06/2019 0533   GFRNONAA >60 07/01/2020 0612   GFRAA >60 07/01/2020 0612    Lipid Panel     Component Value Date/Time   CHOL 125 09/04/2019 0359   TRIG 29 09/04/2019 0359   HDL 54 09/04/2019 0359   CHOLHDL 2.3 09/04/2019 0359   VLDL 6 09/04/2019 0359   LDLCALC 65 09/04/2019 0359     Imaging I have reviewed the images obtained: MRI brain without contrast demonstrates small curvilinear cortical to subcortical ischemic nonhemorrhagic infarction in the parasagittal posterior left frontal lobe.  Chronic right parietal encephalomalacia and gliosis with chronic hemosiderin staining and sequela of overlying right parietal craniotomy which is stable since last exam.  Underlying moderate to advanced age-related cerebral atrophy and chronic microvascular ischemic changes in multiple remote lacunar infarct in the left thalamus and bilateral cerebellar hemispheres.  Assessment:  Acute ischemic stroke-new on the left cerebral hemisphere  likely embolic in etiology given history of mitral valve repair and on Coumadin. Prior residual hemiparesis from prior strokes involving the right hemisphere-stable.  Recommendations: -Admit to hospitalist -Telemetry -Continue Coumadin with a goal INR 3-3.5 due to the valve-small stroke size with low risk for hemorrhagic transformation-benefits of Coumadin due to the heart valve outweigh the risks of hemorrhagic transformation of the small stroke. (obtain pharmacy consult for coumadin monitoring) -2D echo -CTA head and neck -A1c -Lipid panel -Frequent neurochecks -PT -OT -Speech therapy -Bedside swallow evaluation-n.p.o. until cleared bedside swallow or formal swallow evaluation -Allow for permissive hypertension-treat only if systolic blood pressures greater than 180 (low permissive hypertension range given the fact that she is on Coumadin and has ischemic strokes)-would not aggressively bring blood pressure down for 24 to 48 hours and then bring blood pressure down for goal discharge blood pressure of less than 140/90.  Plan discussed with the patient at bedside as well as her husband over the phone.  Preliminary recommendations relayed to the ED PA-C Benedetto Goad.  Stroke team will follow with you.  -- Amie Portland, MD [facility] Pager: [phone] If 7pm to 7am, please call on call as listed on AMION.

## 2020-07-01 NOTE — H&P (Signed)
History and Physical    [name]:[id number] DOB: 31-Oct-1946 DOA: 07/01/2020  PCP: Haywood Pao, MD   Patient coming from: Home  Chief Complaint: Dizziness, weakness  HPI: [name] is a 74 y.o. 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  Reports that the symptoms began 3 days ago with sudden onset of dizziness and being off balance when she sits up or tries to ambulate.. Reports feeling lightheaded and dizzy, similar to her prior strokes but did not have any tingling numbness or weakness.  Denies any headaches. Denies any visual symptoms.  She states she has not had any difficulty swallowing and has not had slurred speech.  She does report that she occasionally has trouble saying the right words such as saying slop instead of stop but when she does this she notices it immediately and corrects corrected by saying the right word.  States she has not had any fevers, chills, shortness of breath, cough, abdominal pain, nausea, vomiting, diarrhea.  She denies any chest pain or palpitations. Is at home with her husband.  No recent sick contacts.  Denies any recent travel  ED Course: MRI brain was performed in the emergency room that showed cortical areas of acute ischemic stroke in the left hemisphere.  INR was done as an outpatient yesterday and was therapeutic at 3.2.  INR was not repeated in the emergency room initially.  I have ordered an INR now.  Review of Systems:  General: Denies fever, chills, weight loss, night sweats.  Denies dizziness.  Denies change in appetite HENT: Denies head trauma, headache, denies change in hearing, tinnitus.  Denies nasal congestion or bleeding.  Denies sore throat, sores in mouth.  Denies difficulty swallowing Eyes: Denies blurry vision,  pain in eye, drainage.  Denies discoloration of eyes. Neck: Denies pain.  Denies swelling.  Denies pain with movement. Cardiovascular: Denies chest pain, palpitations.  Denies edema.  Denies orthopnea Respiratory: Denies shortness of breath, cough.  Denies wheezing.  Denies sputum production Gastrointestinal: Denies abdominal pain, swelling.  Denies nausea, vomiting, diarrhea.  Denies melena.  Denies hematemesis. Musculoskeletal: Denies limitation of movement.  Denies deformity or swelling.  Denies pain.  Denies arthralgias or myalgias. Genitourinary: Denies pelvic pain.  Denies urinary frequency or hesitancy.  Denies dysuria.  Skin: Denies rash.  Denies petechiae, purpura, ecchymosis. Neurological: Reports ataxia, dizziness for the past few days.  Denies headache.  Denies syncope.  Denies seizure activity.  Denies paresthesia.  Denies slurred speech, drooping face.  Denies visual change. Psychiatric: Denies depression, anxiety.  Denies suicidal thoughts or ideation.  Denies hallucinations.  Past Medical History:  Diagnosis Date  . Abnormality of gait 09/07/2016  . Allergy   . Diabetes mellitus without complication [facility])    Patient denies this - notes history of glucose intolerance  . GERD (gastroesophageal reflux disease)   . Hemiparesis and alteration of sensations as late effects of stroke ([place]) 09/07/2016  . History of pneumonia 1997  . Hypertension   . S/P MVR (mitral valve replacement)    Mechanical mitral valve replacement at age 50 (done in [place])  // echo 7/17: EF 55-60, normal wall motion, bileaflet mechanical mitral valve prosthesis functioning normally, mild LAE, mildly reduced RVSF, small pericardial effusion  . Stroke [facility]) 1997, 2013, 2015    Past Surgical History:  Procedure Laterality Date  .  ABDOMINAL HYSTERECTOMY    . BRAIN SURGERY    . BUNIONECTOMY  1993  . CARDIAC VALVE REPLACEMENT  1997  . [place]    Social History  reports that she has never  smoked. She has never used smokeless tobacco. She reports that she does not drink alcohol and does not use drugs.  Allergies  Allergen Reactions  . Zoloft [Sertraline] Hives, Itching and Rash    Family History  Problem Relation Age of Onset  . Cancer Mother        Bone  . Heart disease Mother   . Hyperlipidemia Mother   . Hypertension Mother   . Stroke Father   . Hypertension Father   . Heart attack Neg Hx      Prior to Admission medications   Medication Sig Start Date End Date Taking? Authorizing Provider  acetaminophen (TYLENOL) 325 MG tablet Take 2 tablets (650 mg total) by mouth every 4 (four) hours as needed for mild pain (or temp > 37.5 C (99.5 F)). Patient taking differently: Take 650 mg by mouth daily.  09/14/19  Yes Angiulli, Lavon Paganini, PA-C  amLODipine (NORVASC) 10 MG tablet Take 1 tablet (10 mg total) by mouth daily. Patient taking differently: Take 10 mg by mouth at bedtime.  09/14/19  Yes Angiulli, Lavon Paganini, PA-C  amoxicillin (AMOXIL) 500 MG capsule Take 2,000 mg by mouth as needed. 1 hour before appointment  02/18/20  Yes [provider]  aspirin EC 81 MG tablet Take 81 mg by mouth daily.    Yes [provider]  atorvastatin (LIPITOR) 20 MG tablet Take 1 tablet (20 mg total) by mouth daily. Patient taking differently: Take 20 mg by mouth at bedtime.  09/14/19  Yes Angiulli, Lavon Paganini, PA-C  calcium carbonate 1250 MG capsule Take 1,200 mg by mouth at bedtime.    Yes [provider]  cholecalciferol (VITAMIN D) 1000 UNITS tablet Take 2,000 Units daily by mouth.    Yes [provider]  docusate sodium (COLACE) 100 MG capsule Take 100 mg at bedtime by mouth.    Yes [provider]  Menthol, Topical Analgesic, (BENGAY EX) Apply 1 application topically daily. All painful areas   Yes [provider]  omeprazole (PRILOSEC) 20 MG capsule Take 20 mg by mouth at bedtime.    Yes [provider]  Propylene Glycol-Glycerin  (SOOTHE) 0.6-0.6 % SOLN Place 1 drop into both eyes 2 (two) times daily as needed (dry eyes).   Yes [provider]  warfarin (COUMADIN) 5 MG tablet TAKE BY MOUTH AS DIRECTED BY THE COUMADIN CLINIC FOR 30 DAYS. Patient taking differently: Take 5-7.5 mg by mouth See admin instructions. 7.5mg  on MON and FRI and 5mg  on all other days. 04/08/20  Yes Nahser, Wonda Cheng, MD    Physical Exam: Vitals:   07/01/20 1930 07/01/20 2100 07/01/20 2115 07/01/20 2200  BP: 122/75 124/79 123/78 139/82  Pulse: 72 69 69 73  Resp: (!) 22 16 14 18   Temp:      TempSrc:      SpO2: 97% 95% 97% 97%  Weight:      Height:        Constitutional: NAD, calm, comfortable Vitals:   07/01/20 1930 07/01/20 2100 07/01/20 2115 07/01/20 2200  BP: 122/75 124/79 123/78 139/82  Pulse: 72 69 69 73  Resp: (!) 22 16 14 18   Temp:      TempSrc:      SpO2: 97% 95% 97% 97%  Weight:      Height:       General: WDWN, Alert and oriented x3.  Eyes: EOMI, PERRL, lids and conjunctivae normal.  Sclera nonicteric.  No nystagmus HENT:  [place]/AT, external ears normal.  Nares patent without epistasis.  Mucous membranes are moist. Posterior pharynx clear of any exudate or lesions. Normal dentition.  Tongue with normal extension without deviation. Neck: Soft, normal range of motion, supple, no masses, no thyromegaly.  Trachea midline Respiratory: clear to auscultation bilaterally, no wheezing, no crackles. Normal respiratory effort. No accessory muscle use.  Cardiovascular: Regular rate and rhythm, ankle click present.  No murmurs / rubs / gallops. No extremity edema. 2+ pedal pulses. No carotid bruits.  Abdomen: Soft, no tenderness, nondistended, no rebound or guarding.  No masses palpated. No hepatosplenomegaly. Bowel sounds normoactive Musculoskeletal: Normal range of motion of right upper and lower extremity.  No clubbing / cyanosis. No joint deformity upper and lower extremities.  Left foot drop that is chronic noted. Skin: Warm,  dry, intact no rashes, lesions, ulcers. No induration Neurologic: CN 2-12 grossly intact.  Normal speech.  Sensation intact, patella DTR +2 left and +1 on right.  Increased tone of left upper extremity with 4-5 strength on extension of left upper extremity.  Patient also has 4-5 strength of left lower extremity hip flexion.  Right upper and lower extremity 5 out of 5.  No drift.  Babinski downgoing bilaterally. Psychiatric: Normal judgment and insight.  Normal mood.    Labs on Admission: I have personally reviewed following labs and imaging studies  CBC: Recent Labs  Lab 07/01/20 0612  WBC 4.9  HGB 13.6  HCT 42.9  MCV 92.1  PLT 389    Basic Metabolic Panel: Recent Labs  Lab 07/01/20 0612  NA 142  K 3.8  CL 105  CO2 27  GLUCOSE 139*  BUN 20  CREATININE 0.77  CALCIUM 10.2    GFR: Estimated Creatinine Clearance: 58.7 mL/min (by C-G formula based on SCr of 0.77 mg/dL).  Liver Function Tests: No results for input(s): AST, ALT, ALKPHOS, BILITOT, PROT, ALBUMIN in the last 168 hours.  Urine analysis:    Component Value Date/Time   COLORURINE STRAW (A) 07/01/2020 0553   APPEARANCEUR CLEAR 07/01/2020 0553   LABSPEC 1.008 07/01/2020 0553   PHURINE 6.0 07/01/2020 0553   GLUCOSEU NEGATIVE 07/01/2020 0553   HGBUR NEGATIVE 07/01/2020 0553   [place] 07/01/2020 0553   [place] 07/01/2020 0553   PROTEINUR NEGATIVE 07/01/2020 0553   NITRITE NEGATIVE 07/01/2020 0553   LEUKOCYTESUR NEGATIVE 07/01/2020 0553    Radiological Exams on Admission: MR BRAIN WO CONTRAST  Result Date: 07/01/2020 CLINICAL DATA:  Initial evaluation for neuro deficit, stroke suspected. EXAM: MRI HEAD WITHOUT CONTRAST TECHNIQUE: Multiplanar, multiecho pulse sequences of the brain and surrounding structures were obtained without intravenous contrast. COMPARISON:  Prior Study from 09/03/2019. FINDINGS: Brain: Moderate to advanced generalized age-related cerebral atrophy. Patchy and  confluent T2/FLAIR hyperintensity within the periventricular deep white matter both cerebral hemispheres most consistent with chronic small vessel ischemic disease, moderate in nature. Multiple scattered remote lacunar infarcts noted involving the left thalamus. Multiple scattered small remote bilateral cerebellar infarcts noted as well. Large area of encephalomalacia and gliosis involving the cortical and subcortical right parietal lobe again noted. Associated scattered areas of chronic hemosiderin staining. Sequelae of overlying right craniotomy. Few scattered foci of diffusion abnormality within this region favored to be related to susceptibility artifact rather than acute ischemia. Single small curvilinear cortical  subcortical ischemic infarcts seen involving the parasagittal posterior left frontal lobe (series 2, images 43, 40, 39). No associated hemorrhage or mass effect. No other convincing foci of restricted diffusion to suggest acute or subacute ischemia. Gray-white matter differentiation otherwise maintained. No acute intracranial hemorrhage. Additional single punctate chronic microhemorrhage noted within the left temporal lobe. No mass lesion, midline shift or mass effect. Mild ventricular prominence related global parenchymal volume loss without hydrocephalus. Mild ex vacuo dilatation of the right lateral ventricle related to the right parietal encephalomalacia noted as well. No extra-axial fluid collection. Pituitary gland suprasellar region normal. Midline structures intact. Vascular: Major intracranial vascular flow voids are maintained. Skull and upper cervical spine: Craniocervical junction within normal limits. Bone marrow signal intensity normal. Remote right parietal craniotomy noted. No acute scalp soft tissue abnormality. Sinuses/Orbits: Globes and orbital soft tissues within normal limits. Paranasal sinuses are largely clear. No significant mastoid effusion. Other: None. IMPRESSION: 1. Single  small curvilinear cortical to subcortical ischemic nonhemorrhagic infarct involving the parasagittal posterior left frontal lobe. 2. No other acute intracranial abnormality. 3. Chronic right parietal encephalomalacia and gliosis with associated chronic hemosiderin staining and sequelae of overlying right parietal craniotomy, stable. 4. Underlying moderate to advanced age-related cerebral atrophy with chronic microvascular ischemic disease, with multiple remote lacunar infarcts involving the left thalamus and bilateral cerebellar hemispheres. Electronically Signed   By: Jeannine Boga M.D.   On: 07/01/2020 20:51    EKG: Independently reviewed.  EKG shows normal sinus rhythm with first-degree AV block.  QTc 414.  No acute ST elevation or depression.  Assessment/Plan Principal Problem:   Acute CVA (cerebrovascular accident) [facility]) Ms. Struckman is admitted to medical telemetry unit.  And is on Coumadin with a therapeutic INR which will be continued.  Pharmacy is been consulted to dose and manage Coumadin Neurology has been consulted with a positive MRI showing an acute CVA. Dysphagia screening before patient be allowed to eat. Consult PT/OT/ST in the morning. Patient has been on Lipitor 20 mg p.o. daily.  Will increase to 80 mg p.o. daily.  Studies have shown that 80 mg of Lipitor is beneficial to decreasing risk of recurrent CVA.  Check lipid panel in the morning. Patient has history of metabolic syndrome with a hemoglobin A1c of 6.1 in the past.  Will recheck hemoglobin A1c She has a history of hypertension and will be continued on her home dose of Norvasc.  Blood pressure be monitored closely.  She has had symptoms for more than 24 hours before coming to the emergency room and therefore is appropriate to control blood pressure to goal Obtain CT angiography of head and neck to rule out large vessel occlusion. Obtain echocardiogram in the morning  Active Problems:   HTN (hypertension) Blood  pressure is stable.  Continue home dose of amlodipine.     Hyperlipemia Check lipid panel.  Continue patient's Lipitor but increase dose to 80 mg as above    Vertigo Consult PT in morning    Hemiparesis (L sided - mild) due to old stroke ([place]) Chronic from previous CVA    H/O mitral valve replacement with mechanical valve Chronic.  Continue Coumadin.    Long term (current) use of anticoagulants [Z79.01] INR goal is 3-3.5 with mechanical heart valve.    DVT prophylaxis: Patient is fully anticoagulated on Coumadin with INR that is therapeutic.  We will continue Coumadin.  Pharmacy to monitor and dose Code Status:   Full code Family Communication:  Diagnosis plan discussed with patient.  Patient verbalized understanding agrees with plan.  Further recommendation follow as clinically indicated Disposition Plan:   Patient is from:  Home  Anticipated DC to:  Home  Anticipated DC date:  Anticipate discharge in more than 2 midnights to evaluate and treat acute medical condition  Anticipated DC barriers: No barriers to discharge identified at this time.  Patient may need home health services to be arranged before discharge  Consults called:  Neurology, Dr. Rory Percy Admission status:  Inpatient  Severity of Illness: The appropriate patient status for this patient is INPATIENT. Inpatient status is judged to be reasonable and necessary in order to provide the required intensity of service to ensure the patient's safety. The patient's presenting symptoms, physical exam findings, and initial radiographic and laboratory data in the context of their chronic comorbidities is felt to place them at high risk for further clinical deterioration. Furthermore, it is not anticipated that the patient will be medically stable for discharge from the hospital within 2 midnights of admission. The following factors support the patient status of inpatient.    * I certify that at the point of admission it is my  clinical judgment that the patient will require inpatient hospital care spanning beyond 2 midnights from the point of admission due to high intensity of service, high risk for further deterioration and high frequency of surveillance required.Yevonne Aline Yeimi Debnam MD [facility]  How to contact the [facility] Attending or Consulting provider [place] or covering provider during after hours [place], for this patient?   1. Check the care team in [facility] and look for a) attending/consulting [facility] provider listed and b) the [facility] team listed 2. Log into [email or web address] and use [place]'s universal password to access. If you do not have the password, please contact the hospital operator. 3. Locate the [facility] provider you are looking for under [facility] and page to a number that you can be directly reached. 4. If you still have difficulty reaching the provider, please page the [facility] (Director on Call) for the Hospitalists listed on amion for assistance.  07/01/2020, 10:34 PM

## 2020-07-02 ENCOUNTER — Inpatient Hospital Stay (HOSPITAL_COMMUNITY): Payer: Medicare Other

## 2020-07-02 DIAGNOSIS — I361 Nonrheumatic tricuspid (valve) insufficiency: Secondary | ICD-10-CM

## 2020-07-02 DIAGNOSIS — Z952 Presence of prosthetic heart valve: Secondary | ICD-10-CM

## 2020-07-02 DIAGNOSIS — R42 Dizziness and giddiness: Secondary | ICD-10-CM

## 2020-07-02 LAB — ECHOCARDIOGRAM COMPLETE
AR max vel: 2.21 cm2
AV Area VTI: 2.26 cm2
AV Area mean vel: 2.05 cm2
AV Mean grad: 2 mmHg
AV Peak grad: 4.2 mmHg
Ao pk vel: 1.02 m/s
Height: 67 in
MV VTI: 1.84 cm2
S' Lateral: 2.3 cm
Weight: 2128 oz

## 2020-07-02 LAB — LIPID PANEL
Cholesterol: 148 mg/dL (ref 0–200)
HDL: 57 mg/dL (ref >40)
LDL Cholesterol: 75 mg/dL (ref 0–99)
Total CHOL/HDL Ratio: 2.6 RATIO
Triglycerides: 82 mg/dL (ref <150)
VLDL: 16 mg/dL (ref 0–40)

## 2020-07-02 LAB — HEMOGLOBIN A1C
Hgb A1c MFr Bld: 6.1 % — ABNORMAL HIGH (ref 4.8–5.6)
Mean Plasma Glucose: 128.37 mg/dL

## 2020-07-02 LAB — PROTIME-INR
INR: 2.9 — ABNORMAL HIGH (ref 0.8–1.2)
Prothrombin Time: 29.1 seconds — ABNORMAL HIGH (ref 11.4–15.2)

## 2020-07-02 MED ORDER — WARFARIN - PHARMACIST DOSING INPATIENT: Freq: Every day | Status: DC

## 2020-07-02 MED ORDER — AMLODIPINE BESYLATE 10 MG PO TABS
10.0000 mg | ORAL_TABLET | Freq: Every day | ORAL | Status: DC
  Administered 2020-07-02 – 2020-07-03 (×2): 10 mg via ORAL
  Filled 2020-07-02 (×2): qty 1

## 2020-07-02 MED ORDER — ATORVASTATIN CALCIUM 40 MG PO TABS
40.0000 mg | ORAL_TABLET | Freq: Every day | ORAL | Status: DC
  Administered 2020-07-02 – 2020-07-03 (×2): 40 mg via ORAL
  Filled 2020-07-02 (×2): qty 1

## 2020-07-02 MED ORDER — PANTOPRAZOLE SODIUM 40 MG PO TBEC
40.0000 mg | DELAYED_RELEASE_TABLET | Freq: Every day | ORAL | Status: DC
  Administered 2020-07-02 – 2020-07-04 (×3): 40 mg via ORAL
  Filled 2020-07-02 (×3): qty 1

## 2020-07-02 MED ORDER — ACETAMINOPHEN 650 MG RE SUPP: 650.0000 mg | RECTAL | Status: DC | PRN

## 2020-07-02 MED ORDER — STROKE: EARLY STAGES OF RECOVERY BOOK
Freq: Once | Status: AC
  Filled 2020-07-02: qty 1

## 2020-07-02 MED ORDER — IOHEXOL 350 MG/ML SOLN
80.0000 mL | Freq: Once | INTRAVENOUS | Status: AC | PRN
  Administered 2020-07-02: 80 mL via INTRAVENOUS

## 2020-07-02 MED ORDER — SODIUM CHLORIDE 0.9 % IV SOLN: INTRAVENOUS | Status: DC

## 2020-07-02 MED ORDER — ATORVASTATIN CALCIUM 80 MG PO TABS: 80.0000 mg | ORAL_TABLET | Freq: Every day | ORAL | Status: DC

## 2020-07-02 MED ORDER — DOCUSATE SODIUM 100 MG PO CAPS
100.0000 mg | ORAL_CAPSULE | Freq: Every day | ORAL | Status: DC
  Administered 2020-07-02 – 2020-07-03 (×2): 100 mg via ORAL
  Filled 2020-07-02 (×2): qty 1

## 2020-07-02 MED ORDER — WARFARIN SODIUM 7.5 MG PO TABS
7.5000 mg | ORAL_TABLET | Freq: Once | ORAL | Status: AC
  Administered 2020-07-02: 7.5 mg via ORAL
  Filled 2020-07-02 (×2): qty 1

## 2020-07-02 MED ORDER — ACETAMINOPHEN 325 MG PO TABS: 650.0000 mg | ORAL_TABLET | ORAL | Status: DC | PRN

## 2020-07-02 MED ORDER — WARFARIN SODIUM 5 MG PO TABS: 5.0000 mg | ORAL_TABLET | ORAL | Status: DC

## 2020-07-02 MED ORDER — ASPIRIN EC 81 MG PO TBEC
81.0000 mg | DELAYED_RELEASE_TABLET | Freq: Every day | ORAL | Status: DC
  Administered 2020-07-02 – 2020-07-04 (×3): 81 mg via ORAL
  Filled 2020-07-02 (×3): qty 1

## 2020-07-02 MED ORDER — ACETAMINOPHEN 160 MG/5ML PO SOLN: 650.0000 mg | ORAL | Status: DC | PRN

## 2020-07-02 NOTE — ED Notes (Signed)
Ordered Hosp Bed °

## 2020-07-02 NOTE — Progress Notes (Addendum)
PROGRESS NOTE    [name]  KZS:[id number] DOB: 10-12-1946 DOA: 07/01/2020 PCP: Haywood Pao, MD    Chief Complaint  Patient presents with  . Dizziness  . 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  Patient reports her symptoms started 3 days prior to admission.  MRI of the brain was done showed cortical areas of acute ischemic stroke in the left hemisphere.  INR was done as an outpatient yesterday and therapeutic at 3.2.  Neurologist consulted for recommendations.  Assessment & Plan:   Principal Problem:   Acute CVA (cerebrovascular accident) ([place]) Active Problems:   Hemiparesis (L sided - mild) due to old stroke [facility])   H/O mitral valve replacement with mechanical valve   HTN (hypertension)   Long term (current) use of anticoagulants [Z79.01]   Hyperlipemia   Vertigo   Acute left hemispheric CVA ~Recommended to continue with Coumadin with an INR goal of 3-3.5. Get echocardiogram, get CT angiogram of the head and neck.  It shows No emergent finding.  Mild atherosclerosis without flow limiting stenosis or embolic source identified in the head and neck. Intracranial findings as described on preceding brain MRI. SLP eval passed swallowing evaluation. PT OT evaluations are pending.  Hemoglobin A1c is 6.1.  LDL is 75. Continue with aspirin 81 mg and Lipitor 40 mg daily. Permissive hypertension     History of mitral valve replacement with mechanical valve Continue with Coumadin this morning is subtherapeutic at 2.9.   Hypertension Continue with Norvasc.   DVT prophylaxis: Coumadin Code Status full code Family Communication: None at bedside disposition:   Status is: Inpatient  Remains inpatient appropriate because:Ongoing diagnostic testing needed  not appropriate for outpatient work up   Dispo: The patient is from: Home              Anticipated d/c is to: Pending              Anticipated d/c date is: 1 day              Patient currently is not medically stable to d/c.       Consultants:   Neurology.    Procedures:CT angio of the head and neck.   Echocardiogram.    Antimicrobials: none.    Subjective: No new complaints.   Objective: Vitals:   07/02/20 0245 07/02/20 0500 07/02/20 0600 07/02/20 0700  BP: 123/74 128/79 128/71 124/80  Pulse: 67 70 66 67  Resp: 17 16 15 16   Temp:      TempSrc:      SpO2: 94% 97% 97% 96%  Weight:      Height:       No intake or output data in the 24 hours ending 07/02/20 1253 Filed Weights   07/01/20 0558  Weight: 60.3 kg    Examination:  General exam: Appears calm and comfortable  Respiratory system: Clear to auscultation. Respiratory effort normal. Cardiovascular system: S1 & S2 heard, RRR. No JVD,. No pedal edema. Gastrointestinal system: Abdomen is nondistended, soft and nontender. Normal bowel sounds heard. Central nervous system: Alert, mild dysarthria, left-sided weakness present. Extremities: Symmetric 5 x 5 power. Skin: No rashes, Psychiatry: Mood & affect appropriate.     Data Reviewed: I have personally reviewed following labs and imaging studies  CBC: Recent Labs  Lab 07/01/20 0612  WBC  4.9  HGB 13.6  HCT 42.9  MCV 92.1  PLT 865    Basic Metabolic Panel: Recent Labs  Lab 07/01/20 0612  NA 142  K 3.8  CL 105  CO2 27  GLUCOSE 139*  BUN 20  CREATININE 0.77  CALCIUM 10.2    GFR: Estimated Creatinine Clearance: 58.7 mL/min (by C-G formula based on SCr of 0.77 mg/dL).  Liver Function Tests: No results for input(s): AST, ALT, ALKPHOS, BILITOT, PROT, ALBUMIN in the last 168 hours.  CBG: No results for input(s): GLUCAP in the last 168 hours.   Recent Results (from the past 240 hour(s))  SARS Coronavirus 2 by RT PCR (hospital order,  performed in [facility] hospital lab) Nasopharyngeal Nasopharyngeal Swab     Status: None   Collection Time: 07/01/20  9:58 PM   Specimen: Nasopharyngeal Swab  Result Value Ref Range Status   SARS Coronavirus 2 NEGATIVE NEGATIVE Final    Comment: (NOTE) SARS-CoV-2 target nucleic acids are NOT DETECTED.  The SARS-CoV-2 RNA is generally detectable in upper and lower respiratory specimens during the acute phase of infection. The lowest concentration of SARS-CoV-2 viral copies this assay can detect is 250 copies / mL. A negative result does not preclude SARS-CoV-2 infection and should not be used as the sole basis for treatment or other patient management decisions.  A negative result may occur with improper specimen collection / handling, submission of specimen other than nasopharyngeal swab, presence of viral mutation(s) within the areas targeted by this assay, and inadequate number of viral copies (<250 copies / mL). A negative result must be combined with clinical observations, patient history, and epidemiological information.  Fact Sheet for Patients:   StrictlyIdeas.no  Fact Sheet for Healthcare Providers: BankingDealers.co.za  This test is not yet approved or  cleared by the [place] FDA and has been authorized for detection and/or diagnosis of SARS-CoV-2 by FDA under an Emergency Use Authorization (EUA).  This EUA will remain in effect (meaning this test can be used) for the duration of the COVID-19 declaration under Section 564(b)(1) of the Act, 21 U.S.C. section 360bbb-3(b)(1), unless the authorization is terminated or revoked sooner.  Performed at [facility], [place] [address], [place], [place] [address]          Radiology Studies: CT ANGIO HEAD W OR WO CONTRAST  Result Date: 07/02/2020 CLINICAL DATA:  Dizziness and weakness. History of stroke with residual left-sided weakness. EXAM: CT ANGIOGRAPHY HEAD AND  NECK TECHNIQUE: Multidetector CT imaging of the head and neck was performed using the standard protocol during bolus administration of intravenous contrast. Multiplanar CT image reconstructions and MIPs were obtained to evaluate the vascular anatomy. Carotid stenosis measurements (when applicable) are obtained utilizing NASCET criteria, using the distal internal carotid diameter as the denominator. CONTRAST:  52mL OMNIPAQUE IOHEXOL 350 MG/ML SOLN COMPARISON:  Brain MRI from yesterday.  CTA head neck 09/04/2019 FINDINGS: CT HEAD FINDINGS Brain: The patient's left frontal acute infarct is not readily identified by noncontrast CT. Chronic encephalomalacia and high-density nodule centered in the right parietal lobe, deep to a craniotomy. There is chronic small vessel ischemia including a remote lacunar infarct at the left thalamus. Right more than left small cerebellar infarcts. No acute hemorrhage, hydrocephalus, or masslike finding Vascular: See below Skull: Unremarkable right parietal craniotomy. No destructive findings Sinuses: Clear Orbits: Negative CTA NECK FINDINGS Aortic arch: Limited coverage of the arch which shows no dilatation or acute finding. Right carotid system: Mild atheromatous changes at the  proximal ICA. No dissection or beading. Left carotid system: Mild atheromatous plaque at the carotid bifurcation. No stenosis, ulceration, or beading. Vertebral arteries: Proximal subclavian atherosclerosis without flow limiting stenosis or ulceration. The vertebral arteries are smooth and widely patent to the dura. Skeleton: Cervical disc and facet degeneration. No acute or aggressive finding Other neck: No significant incidental finding. Upper chest: Negative Review of the MIP images confirms the above findings CTA HEAD FINDINGS Anterior circulation: Less than typical atheromatous changes on the siphons. No branch occlusion, beading, or aneurysm. Posterior circulation: Vertebral and basilar arteries are smooth  and widely patent. Unremarkable cerebellar branching. Hypoplastic left P1 segment. Venous sinuses: Diffusely patent Anatomic variants: As above Review of the MIP images confirms the above findings IMPRESSION: 1. No emergent finding. 2. Mild atherosclerosis without flow limiting stenosis or embolic source identified in the head and neck. 3. Intracranial findings as described on preceding brain MRI. Electronically Signed   By: Monte Fantasia M.D.   On: 07/02/2020 04:46   CT ANGIO NECK W OR WO CONTRAST  Result Date: 07/02/2020 CLINICAL DATA:  Dizziness and weakness. History of stroke with residual left-sided weakness. EXAM: CT ANGIOGRAPHY HEAD AND NECK TECHNIQUE: Multidetector CT imaging of the head and neck was performed using the standard protocol during bolus administration of intravenous contrast. Multiplanar CT image reconstructions and MIPs were obtained to evaluate the vascular anatomy. Carotid stenosis measurements (when applicable) are obtained utilizing NASCET criteria, using the distal internal carotid diameter as the denominator. CONTRAST:  36mL OMNIPAQUE IOHEXOL 350 MG/ML SOLN COMPARISON:  Brain MRI from yesterday.  CTA head neck 09/04/2019 FINDINGS: CT HEAD FINDINGS Brain: The patient's left frontal acute infarct is not readily identified by noncontrast CT. Chronic encephalomalacia and high-density nodule centered in the right parietal lobe, deep to a craniotomy. There is chronic small vessel ischemia including a remote lacunar infarct at the left thalamus. Right more than left small cerebellar infarcts. No acute hemorrhage, hydrocephalus, or masslike finding Vascular: See below Skull: Unremarkable right parietal craniotomy. No destructive findings Sinuses: Clear Orbits: Negative CTA NECK FINDINGS Aortic arch: Limited coverage of the arch which shows no dilatation or acute finding. Right carotid system: Mild atheromatous changes at the proximal ICA. No dissection or beading. Left carotid system: Mild  atheromatous plaque at the carotid bifurcation. No stenosis, ulceration, or beading. Vertebral arteries: Proximal subclavian atherosclerosis without flow limiting stenosis or ulceration. The vertebral arteries are smooth and widely patent to the dura. Skeleton: Cervical disc and facet degeneration. No acute or aggressive finding Other neck: No significant incidental finding. Upper chest: Negative Review of the MIP images confirms the above findings CTA HEAD FINDINGS Anterior circulation: Less than typical atheromatous changes on the siphons. No branch occlusion, beading, or aneurysm. Posterior circulation: Vertebral and basilar arteries are smooth and widely patent. Unremarkable cerebellar branching. Hypoplastic left P1 segment. Venous sinuses: Diffusely patent Anatomic variants: As above Review of the MIP images confirms the above findings IMPRESSION: 1. No emergent finding. 2. Mild atherosclerosis without flow limiting stenosis or embolic source identified in the head and neck. 3. Intracranial findings as described on preceding brain MRI. Electronically Signed   By: Monte Fantasia M.D.   On: 07/02/2020 04:46   MR BRAIN WO CONTRAST  Result Date: 07/01/2020 CLINICAL DATA:  Initial evaluation for neuro deficit, stroke suspected. EXAM: MRI HEAD WITHOUT CONTRAST TECHNIQUE: Multiplanar, multiecho pulse sequences of the brain and surrounding structures were obtained without intravenous contrast. COMPARISON:  Prior Study from 09/03/2019. FINDINGS: Brain: Moderate to advanced  generalized age-related cerebral atrophy. Patchy and confluent T2/FLAIR hyperintensity within the periventricular deep white matter both cerebral hemispheres most consistent with chronic small vessel ischemic disease, moderate in nature. Multiple scattered remote lacunar infarcts noted involving the left thalamus. Multiple scattered small remote bilateral cerebellar infarcts noted as well. Large area of encephalomalacia and gliosis involving the  cortical and subcortical right parietal lobe again noted. Associated scattered areas of chronic hemosiderin staining. Sequelae of overlying right craniotomy. Few scattered foci of diffusion abnormality within this region favored to be related to susceptibility artifact rather than acute ischemia. Single small curvilinear cortical subcortical ischemic infarcts seen involving the parasagittal posterior left frontal lobe (series 2, images 43, 40, 39). No associated hemorrhage or mass effect. No other convincing foci of restricted diffusion to suggest acute or subacute ischemia. Gray-white matter differentiation otherwise maintained. No acute intracranial hemorrhage. Additional single punctate chronic microhemorrhage noted within the left temporal lobe. No mass lesion, midline shift or mass effect. Mild ventricular prominence related global parenchymal volume loss without hydrocephalus. Mild ex vacuo dilatation of the right lateral ventricle related to the right parietal encephalomalacia noted as well. No extra-axial fluid collection. Pituitary gland suprasellar region normal. Midline structures intact. Vascular: Major intracranial vascular flow voids are maintained. Skull and upper cervical spine: Craniocervical junction within normal limits. Bone marrow signal intensity normal. Remote right parietal craniotomy noted. No acute scalp soft tissue abnormality. Sinuses/Orbits: Globes and orbital soft tissues within normal limits. Paranasal sinuses are largely clear. No significant mastoid effusion. Other: None. IMPRESSION: 1. Single small curvilinear cortical to subcortical ischemic nonhemorrhagic infarct involving the parasagittal posterior left frontal lobe. 2. No other acute intracranial abnormality. 3. Chronic right parietal encephalomalacia and gliosis with associated chronic hemosiderin staining and sequelae of overlying right parietal craniotomy, stable. 4. Underlying moderate to advanced age-related cerebral  atrophy with chronic microvascular ischemic disease, with multiple remote lacunar infarcts involving the left thalamus and bilateral cerebellar hemispheres. Electronically Signed   By: Jeannine Boga M.D.   On: 07/01/2020 20:51        Scheduled Meds: . amLODipine  10 mg Oral QHS  . aspirin EC  81 mg Oral Daily  . atorvastatin  40 mg Oral QHS  . docusate sodium  100 mg Oral QHS  . pantoprazole  40 mg Oral Daily  . warfarin  7.5 mg Oral ONCE-1600  . Warfarin - Pharmacist Dosing Inpatient   Oral q1600   Continuous Infusions: . sodium chloride 75 mL/hr at 07/02/20 0426     LOS: 1 day        Gabrielle Poisson, MD [facility]   To contact the attending provider between 7A-7P or the covering provider during after hours 7P-7A, please log into the web site [email or web address] and access using universal [place] password for that web site. If you do not have the password, please call the hospital operator.  07/02/2020, 12:53 PM

## 2020-07-02 NOTE — Progress Notes (Signed)
STROKE TEAM PROGRESS NOTE   INTERVAL HISTORY No family at the bedside.  Patient lying in bed, no distress.  Stated that she felt lightheadedness for several days with head motion, either looking back, looked upward, getting in or out of the bed, moving around. No vertigo, no fall or N/V. Today she felt more heaviness on the left lower extremity, but still able to walk.  She stated that she compliant with Coumadin, INR normally is 3.0-3.5.  However lately it goes down to 2.7, 2.8 and then goes up to 4.5.  Vitals:   07/02/20 0500 07/02/20 0600 07/02/20 0700 07/02/20 1641  BP: 128/79 128/71 124/80 126/90  Pulse: 70 66 67 84  Resp: 16 15 16 18   Temp:    98.4 F (36.9 C)  TempSrc:    Oral  SpO2: 97% 97% 96% 97%  Weight:      Height:       CBC:  Recent Labs  Lab 07/01/20 0612  WBC 4.9  HGB 13.6  HCT 42.9  MCV 92.1  PLT 284   Basic Metabolic Panel:  Recent Labs  Lab 07/01/20 0612  NA 142  K 3.8  CL 105  CO2 27  GLUCOSE 139*  BUN 20  CREATININE 0.77  CALCIUM 10.2   Lipid Panel:  Recent Labs  Lab 07/02/20 0500  CHOL 148  TRIG 82  HDL 57  CHOLHDL 2.6  VLDL 16  LDLCALC 75   HgbA1c:  Recent Labs  Lab 07/02/20 0500  HGBA1C 6.1*   Urine Drug Screen: No results for input(s): LABOPIA, COCAINSCRNUR, LABBENZ, AMPHETMU, THCU, LABBARB in the last 168 hours.  Alcohol Level No results for input(s): ETH in the last 168 hours.  IMAGING past 24 hours CT ANGIO HEAD W OR WO CONTRAST  Result Date: 07/02/2020 CLINICAL DATA:  Dizziness and weakness. History of stroke with residual left-sided weakness. EXAM: CT ANGIOGRAPHY HEAD AND NECK TECHNIQUE: Multidetector CT imaging of the head and neck was performed using the standard protocol during bolus administration of intravenous contrast. Multiplanar CT image reconstructions and MIPs were obtained to evaluate the vascular anatomy. Carotid stenosis measurements (when applicable) are obtained utilizing NASCET criteria, using the distal  internal carotid diameter as the denominator. CONTRAST:  27mL OMNIPAQUE IOHEXOL 350 MG/ML SOLN COMPARISON:  Brain MRI from yesterday.  CTA head neck 09/04/2019 FINDINGS: CT HEAD FINDINGS Brain: The patient's left frontal acute infarct is not readily identified by noncontrast CT. Chronic encephalomalacia and high-density nodule centered in the right parietal lobe, deep to a craniotomy. There is chronic small vessel ischemia including a remote lacunar infarct at the left thalamus. Right more than left small cerebellar infarcts. No acute hemorrhage, hydrocephalus, or masslike finding Vascular: See below Skull: Unremarkable right parietal craniotomy. No destructive findings Sinuses: Clear Orbits: Negative CTA NECK FINDINGS Aortic arch: Limited coverage of the arch which shows no dilatation or acute finding. Right carotid system: Mild atheromatous changes at the proximal ICA. No dissection or beading. Left carotid system: Mild atheromatous plaque at the carotid bifurcation. No stenosis, ulceration, or beading. Vertebral arteries: Proximal subclavian atherosclerosis without flow limiting stenosis or ulceration. The vertebral arteries are smooth and widely patent to the dura. Skeleton: Cervical disc and facet degeneration. No acute or aggressive finding Other neck: No significant incidental finding. Upper chest: Negative Review of the MIP images confirms the above findings CTA HEAD FINDINGS Anterior circulation: Less than typical atheromatous changes on the siphons. No branch occlusion, beading, or aneurysm. Posterior circulation: Vertebral and basilar arteries  are smooth and widely patent. Unremarkable cerebellar branching. Hypoplastic left P1 segment. Venous sinuses: Diffusely patent Anatomic variants: As above Review of the MIP images confirms the above findings IMPRESSION: 1. No emergent finding. 2. Mild atherosclerosis without flow limiting stenosis or embolic source identified in the head and neck. 3. Intracranial  findings as described on preceding brain MRI. Electronically Signed   By: Monte Fantasia M.D.   On: 07/02/2020 04:46   CT ANGIO NECK W OR WO CONTRAST  Result Date: 07/02/2020 CLINICAL DATA:  Dizziness and weakness. History of stroke with residual left-sided weakness. EXAM: CT ANGIOGRAPHY HEAD AND NECK TECHNIQUE: Multidetector CT imaging of the head and neck was performed using the standard protocol during bolus administration of intravenous contrast. Multiplanar CT image reconstructions and MIPs were obtained to evaluate the vascular anatomy. Carotid stenosis measurements (when applicable) are obtained utilizing NASCET criteria, using the distal internal carotid diameter as the denominator. CONTRAST:  66mL OMNIPAQUE IOHEXOL 350 MG/ML SOLN COMPARISON:  Brain MRI from yesterday.  CTA head neck 09/04/2019 FINDINGS: CT HEAD FINDINGS Brain: The patient's left frontal acute infarct is not readily identified by noncontrast CT. Chronic encephalomalacia and high-density nodule centered in the right parietal lobe, deep to a craniotomy. There is chronic small vessel ischemia including a remote lacunar infarct at the left thalamus. Right more than left small cerebellar infarcts. No acute hemorrhage, hydrocephalus, or masslike finding Vascular: See below Skull: Unremarkable right parietal craniotomy. No destructive findings Sinuses: Clear Orbits: Negative CTA NECK FINDINGS Aortic arch: Limited coverage of the arch which shows no dilatation or acute finding. Right carotid system: Mild atheromatous changes at the proximal ICA. No dissection or beading. Left carotid system: Mild atheromatous plaque at the carotid bifurcation. No stenosis, ulceration, or beading. Vertebral arteries: Proximal subclavian atherosclerosis without flow limiting stenosis or ulceration. The vertebral arteries are smooth and widely patent to the dura. Skeleton: Cervical disc and facet degeneration. No acute or aggressive finding Other neck: No  significant incidental finding. Upper chest: Negative Review of the MIP images confirms the above findings CTA HEAD FINDINGS Anterior circulation: Less than typical atheromatous changes on the siphons. No branch occlusion, beading, or aneurysm. Posterior circulation: Vertebral and basilar arteries are smooth and widely patent. Unremarkable cerebellar branching. Hypoplastic left P1 segment. Venous sinuses: Diffusely patent Anatomic variants: As above Review of the MIP images confirms the above findings IMPRESSION: 1. No emergent finding. 2. Mild atherosclerosis without flow limiting stenosis or embolic source identified in the head and neck. 3. Intracranial findings as described on preceding brain MRI. Electronically Signed   By: Monte Fantasia M.D.   On: 07/02/2020 04:46   MR BRAIN WO CONTRAST  Result Date: 07/01/2020 CLINICAL DATA:  Initial evaluation for neuro deficit, stroke suspected. EXAM: MRI HEAD WITHOUT CONTRAST TECHNIQUE: Multiplanar, multiecho pulse sequences of the brain and surrounding structures were obtained without intravenous contrast. COMPARISON:  Prior Study from 09/03/2019. FINDINGS: Brain: Moderate to advanced generalized age-related cerebral atrophy. Patchy and confluent T2/FLAIR hyperintensity within the periventricular deep white matter both cerebral hemispheres most consistent with chronic small vessel ischemic disease, moderate in nature. Multiple scattered remote lacunar infarcts noted involving the left thalamus. Multiple scattered small remote bilateral cerebellar infarcts noted as well. Large area of encephalomalacia and gliosis involving the cortical and subcortical right parietal lobe again noted. Associated scattered areas of chronic hemosiderin staining. Sequelae of overlying right craniotomy. Few scattered foci of diffusion abnormality within this region favored to be related to susceptibility artifact rather than acute ischemia.  Single small curvilinear cortical subcortical  ischemic infarcts seen involving the parasagittal posterior left frontal lobe (series 2, images 43, 40, 39). No associated hemorrhage or mass effect. No other convincing foci of restricted diffusion to suggest acute or subacute ischemia. Gray-white matter differentiation otherwise maintained. No acute intracranial hemorrhage. Additional single punctate chronic microhemorrhage noted within the left temporal lobe. No mass lesion, midline shift or mass effect. Mild ventricular prominence related global parenchymal volume loss without hydrocephalus. Mild ex vacuo dilatation of the right lateral ventricle related to the right parietal encephalomalacia noted as well. No extra-axial fluid collection. Pituitary gland suprasellar region normal. Midline structures intact. Vascular: Major intracranial vascular flow voids are maintained. Skull and upper cervical spine: Craniocervical junction within normal limits. Bone marrow signal intensity normal. Remote right parietal craniotomy noted. No acute scalp soft tissue abnormality. Sinuses/Orbits: Globes and orbital soft tissues within normal limits. Paranasal sinuses are largely clear. No significant mastoid effusion. Other: None. IMPRESSION: 1. Single small curvilinear cortical to subcortical ischemic nonhemorrhagic infarct involving the parasagittal posterior left frontal lobe. 2. No other acute intracranial abnormality. 3. Chronic right parietal encephalomalacia and gliosis with associated chronic hemosiderin staining and sequelae of overlying right parietal craniotomy, stable. 4. Underlying moderate to advanced age-related cerebral atrophy with chronic microvascular ischemic disease, with multiple remote lacunar infarcts involving the left thalamus and bilateral cerebellar hemispheres. Electronically Signed   By: Jeannine Boga M.D.   On: 07/01/2020 20:51   ECHOCARDIOGRAM COMPLETE  Result Date: 07/02/2020    ECHOCARDIOGRAM REPORT   Patient Name:   [name]  Date of Exam: 07/02/2020 Medical Rec #:  [id number]      Height:       67.0 in Accession #:    [id number]     Weight:       133.0 lb Date of Birth:  06-13-1946      BSA:          1.700 m Patient Age:    19 years       BP:           123/74 mmHg Patient Gender: F              HR:           76 bpm. Exam Location:  Inpatient Procedure: 2D Echo, Cardiac Doppler and Color Doppler Indications:    Stroke  History:        Patient has prior history of Echocardiogram examinations, most                 recent 09/04/2019. Risk Factors:Diabetes, Hypertension and                 Dyslipidemia. CVA. H/O mechanical mitral valve replacement.                  Mitral Valve: mechanical valve valve is present in the mitral                 position.  Sonographer:    Clayton Lefort RDCS (AE) Referring Phys: [id number] [place]  1. Left ventricular ejection fraction, by estimation, is 55 to 60%. The left ventricle has normal function. The left ventricle has no regional wall motion abnormalities. There is mild concentric left ventricular hypertrophy. Left ventricular diastolic function could not be evaluated.  2. Right ventricular systolic function is normal. The right ventricular size is normal. There is normal pulmonary artery systolic pressure. The estimated right ventricular systolic pressure is 82.5 mmHg.  3. Right atrial size was mildly dilated.  4. Mechanical mitral valve replacement. Normal gradient. EOA 1.84 cm2, DI 1.7, E max 1.1 m/s, PHT 99 msec. The mitral valve has been repaired/replaced. No evidence of mitral valve regurgitation. The mean mitral valve gradient is 3.0 mmHg with average heart rate of 76 bpm. There is a mechanical valve present in the mitral position. Echo findings are consistent with normal structure and function of the mitral valve prosthesis.  5. The aortic valve is tricuspid. Aortic valve regurgitation is not visualized. Mild aortic valve sclerosis is present, with no evidence of aortic valve  stenosis.  6. The inferior vena cava is normal in size with greater than 50% respiratory variability, suggesting right atrial pressure of 3 mmHg. Comparison(s): No significant change from prior study. FINDINGS  Left Ventricle: Left ventricular ejection fraction, by estimation, is 55 to 60%. The left ventricle has normal function. The left ventricle has no regional wall motion abnormalities. The left ventricular internal cavity size was normal in size. There is  mild concentric left ventricular hypertrophy. Abnormal (paradoxical) septal motion consistent with post-operative status. Left ventricular diastolic function could not be evaluated due to mitral valve replacement. Left ventricular diastolic function could not be evaluated. Right Ventricle: The right ventricular size is normal. No increase in right ventricular wall thickness. Right ventricular systolic function is normal. There is normal pulmonary artery systolic pressure. The tricuspid regurgitant velocity is 2.43 m/s, and  with an assumed right atrial pressure of 3 mmHg, the estimated right ventricular systolic pressure is 94.7 mmHg. Left Atrium: Left atrial size was not well visualized. Right Atrium: Right atrial size was mildly dilated. Pericardium: A small pericardial effusion is present. The pericardial effusion is circumferential. Mitral Valve: Mechanical mitral valve replacement. Normal gradient. EOA 1.84 cm2, DI 1.7, E max 1.1 m/s, PHT 99 msec. The mitral valve has been repaired/replaced. No evidence of mitral valve regurgitation. There is a mechanical valve present in the mitral position. Echo findings are consistent with normal structure and function of the mitral valve prosthesis. MV peak gradient, 5.5 mmHg. The mean mitral valve gradient is 3.0 mmHg with average heart rate of 76 bpm. Tricuspid Valve: The tricuspid valve is grossly normal. Tricuspid valve regurgitation is mild . No evidence of tricuspid stenosis. Aortic Valve: The aortic valve is  tricuspid. . There is mild thickening and mild calcification of the aortic valve. Aortic valve regurgitation is not visualized. Mild aortic valve sclerosis is present, with no evidence of aortic valve stenosis. There is mild thickening of the aortic valve. There is mild calcification of the aortic valve. Aortic valve mean gradient measures 2.0 mmHg. Aortic valve peak gradient measures 4.2 mmHg. Aortic valve area, by VTI measures 2.26 cm. Pulmonic Valve: The pulmonic valve was grossly normal. Pulmonic valve regurgitation is trivial. No evidence of pulmonic stenosis. Aorta: The aortic root and ascending aorta are structurally normal, with no evidence of dilitation. Venous: The inferior vena cava is normal in size with greater than 50% respiratory variability, suggesting right atrial pressure of 3 mmHg. IAS/Shunts: The atrial septum is grossly normal.  LEFT VENTRICLE PLAX 2D LVIDd:         3.50 cm LVIDs:         2.30 cm LV PW:         1.40 cm LV IVS:        1.20 cm LVOT diam:     2.00 cm LV SV:         46 LV SV  Index:   27 LVOT Area:     3.14 cm  RIGHT VENTRICLE             IVC RV Basal diam:  3.30 cm     IVC diam: 1.00 cm RV S prime:     10.70 cm/s TAPSE (M-mode): 2.2 cm LEFT ATRIUM             Index       RIGHT ATRIUM           Index LA diam:        2.80 cm 1.65 cm/m  RA Area:     21.30 cm LA Vol (A2C):   63.7 ml 37.46 ml/m RA Volume:   65.30 ml  38.40 ml/m LA Vol (A4C):   48.7 ml 28.64 ml/m LA Biplane Vol: 56.3 ml 33.11 ml/m  AORTIC VALVE AV Area (Vmax):    2.21 cm AV Area (Vmean):   2.05 cm AV Area (VTI):     2.26 cm AV Vmax:           102.00 cm/s AV Vmean:          74.800 cm/s AV VTI:            0.206 m AV Peak Grad:      4.2 mmHg AV Mean Grad:      2.0 mmHg LVOT Vmax:         71.80 cm/s LVOT Vmean:        48.800 cm/s LVOT VTI:          0.148 m LVOT/AV VTI ratio: 0.72  AORTA Ao Root diam: 3.80 cm Ao Asc diam:  3.40 cm MITRAL VALVE            TRICUSPID VALVE MV Area VTI:  1.84 cm  TR Peak grad:   23.6  mmHg MV Peak grad: 5.5 mmHg  TR Vmax:        243.00 cm/s MV Mean grad: 3.0 mmHg MV Vmax:      1.17 m/s  SHUNTS MV Vmean:     83.5 cm/s Systemic VTI:  0.15 m MV VTI:       0.25 m    Systemic Diam: 2.00 cm Eleonore Chiquito MD Electronically signed by Eleonore Chiquito MD Signature Date/Time: 07/02/2020/12:55:23 PM    Final     PHYSICAL EXAM  Temp:  [98.4 F (36.9 C)] 98.4 F (36.9 C) (08/25 1641) Pulse Rate:  [66-84] 84 (08/25 1641) Resp:  [10-28] 18 (08/25 1641) BP: (113-139)/(70-90) 126/90 (08/25 1641) SpO2:  [94 %-98 %] 97 % (08/25 1641)  General - Well nourished, well developed, in no apparent distress.  Ophthalmologic - fundi not visualized due to noncooperation.  Cardiovascular - Regular rhythm and rate.  Mental Status -  Level of arousal and orientation to time, place, and person were intact. Language including expression, naming, repetition, comprehension was assessed and found intact. Fund of Knowledge was assessed and was intact.  Cranial Nerves II - XII - II - Visual field intact OU. III, IV, VI - Extraocular movements intact. V - Facial sensation intact bilaterally. VII - Facial movement intact bilaterally. VIII - Hearing & vestibular intact bilaterally. X - Palate elevates symmetrically. XI - Chin turning & shoulder shrug intact bilaterally. XII - Tongue protrusion intact.  Motor Strength - The patients strength was normal in all extremities and pronator drift was absent except left wrist extension 4/5, left hand dexterity decreased, and left foot DF/PF 4/5.  Left foot on brace.  This is all chronic.  Bulk was normal and fasciculations were absent.   Motor Tone - Muscle tone was assessed at the neck and appendages and was normal.  Reflexes - The patients reflexes were symmetrical in all extremities and she had no pathological reflexes.  Sensory - Light touch, temperature/pinprick were assessed and were symmetrical.    Coordination - The patient had normal movements in  the hands with no ataxia or dysmetria.  Tremor was absent.  Gait and Station - deferred.   ASSESSMENT/PLAN [name] is a 74 y.o. 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   Intermittent lightheadedness - ? 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 Old R parietal encephalomalacia / hemosiderin overlying old crani site. Small vessel disease. Atrophy. Old L thalamic and B cerebellar lacunes.  CTA head & neck no LVO. Mild atherosclerosis.   2D Echo EF 55-60%. No source of embolus. RA mildly dilated.   LDL 75  HgbA1c 6.1  VTE prophylaxis - warfarin  aspirin 81 mg daily and warfarin daily prior to admission w/ INR 3.2, now on aspirin 81 mg daily and warfarin daily. Continue both at d/c.  Given current possible infarct on Coumadin, recommend INR goal 3.0-3.5.  Therapy recommendations:  pending   Disposition:  pending   Hx stroke/TIA  08/2019 - admitted for worsening left-sided weakness.  MRI showed tiny acute punctate stroke in bed of old infarct. unsure etiology; unusual pattern within the bed of the old infarct possibly failure of collaterals. She does have mechanical heart valve and is on long-term anticoagulation and INR was suboptimal at 2.1 on admission.  EF 55%, LDL 65, CT head and neck negative.  Continue on aspirin and  Coumadin.  09/2017 -admitted for dizziness and unsteady gait.  Resolved on admission.  Considered BPPV most likely - typical symptoms despite neg Dix-Hallpike.  MRI negative.  CT head neck negative.  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 Leonie Man at [facility]  Mechanical MV   placement  at age 56  on warfarin.   INR 3.2->2.9  Baseline INR stable at 3.0-3.5 most of the time  Given current event, recommend INR goal 3.0-3.5  Hypertension  Stable  Long-term BP goal normotensive  Hyperlipidemia  Home meds:  liptior 20  Now on lipitor 40  LDL 75, goal < 70  Continue statin at discharge  Other Stroke Risk Factors  Advanced age  Family hx stroke (father)  Hospital day # 1  Neurology will sign off. Please call with questions. Pt will follow up with stroke clinic Ms. McCue at [facility] on 9/28. Thanks for the consult.  Rosalin Hawking, MD PhD Stroke Neurology  07/02/2020 7:44 PM    To contact Stroke Continuity provider, please refer to [email or web address] After hours, contact General Neurology

## 2020-07-02 NOTE — Evaluation (Signed)
Speech Language Pathology Evaluation Patient Details Name: [name] MRN: [id number] DOB: 02-16-1946 Today's Date: 07/02/2020 Time: 5102-5852 SLP Time Calculation (min) (ACUTE ONLY): 25 min  Problem List:  Patient Active Problem List   Diagnosis Date Noted  . Acute CVA (cerebrovascular accident) ([place]) 07/01/2020  . 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 Anticoagulated on warfarin   . Left hemiparesis ([place])   . History of CVA with residual deficit   . Thrombocytopenia ([place])   . Prediabetes   . CVA (cerebral vascular accident) ([place]) 09/03/2019  . Vertigo 09/24/2017  . Benign paroxysmal positional vertigo   . History of stroke   . Hyperlipemia 08/23/2017  . Encounter for therapeutic drug monitoring 08/08/2017  . Long term (current) use of anticoagulants [Z79.01] 07/12/2017  . Hemiparesis and alteration of sensations as late effects of stroke ([place]) 09/07/2016  . Abnormality of gait 09/07/2016  . HTN (hypertension) 04/13/2016  . H/O mitral valve replacement with mechanical valve 10/16/2015  . Diabetes mellitus without complication ([place])   . Hemiparesis (L sided - mild) due to old stroke [facility])    Past Medical History:  Past Medical History:  Diagnosis Date  . Abnormality of gait 09/07/2016  . Allergy   . Diabetes mellitus without complication [facility])    Patient denies this - notes history of glucose intolerance  . GERD (gastroesophageal reflux disease)   . Hemiparesis and alteration of sensations as late effects of stroke ([place]) 09/07/2016  . History of pneumonia 1997  . Hypertension   . S/P MVR (mitral valve replacement)    Mechanical mitral valve replacement at age 74 (done in [place])  // echo 7/17: EF 55-60, normal wall motion, bileaflet mechanical mitral valve prosthesis functioning normally, mild LAE, mildly reduced RVSF, small pericardial effusion  . Stroke [facility]) 1997, 2013, 2015   Past Surgical History:  Past Surgical History:  Procedure Laterality Date  .  ABDOMINAL HYSTERECTOMY    . BRAIN SURGERY    . BUNIONECTOMY  1993  . CARDIAC VALVE REPLACEMENT  1997  . TOE SURGERY  1996   HPI:  Patient is a 74 y.o. 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 She reported symptoms began three days prior. MRI brain revealed cortical areas of acute ischemic stroke in left hemisphere.   Assessment / Plan / Recommendation Clinical Impression  Patient presents with a very mild flaccid dysarthria involving primarily her tongue. She is 100% intelligible at conversational level, however she is not able to achieve normal rate of lingual movements and precise lingual articulation, resulting in speech production rate that is slower and quality which is slightly slurred. Patient did not present with any expressive or receptive language errors and cognitive function for verbal reasoning, orientation, safety awareness, problem solving and memory all WFL. She did describe noticing a couple days ago that when she meant to say "stop" it came out "slop". SLP suspects that she was and is having some mild articulation errors resulting in imprecise consonant production and slowed lingual transitions between phonemes during connected speech. She does not appear to be having any sound substitution or word substitution or naming errors. Patient educated and provided with exercises to work on her dysarthria at home but as her level of severity is very mild, not recommending any formal speech therapy at this time.    SLP Assessment  SLP Recommendation/Assessment: Patient does not need  any further Speech Lanaguage Pathology Services SLP Visit Diagnosis: Dysarthria and anarthria (R47.1)    Follow Up Recommendations  None    Frequency and Duration   N/A        SLP Evaluation Cognition  Overall Cognitive  Status: Within Functional Limits for tasks assessed Arousal/Alertness: Awake/alert Orientation Level: Oriented X4 Memory: Appears intact Awareness: Appears intact Problem Solving: Appears intact Safety/Judgment: Appears intact       Comprehension  Auditory Comprehension Overall Auditory Comprehension: Appears within functional limits for tasks assessed    Expression Expression Primary Mode of Expression: Verbal Verbal Expression Overall Verbal Expression: Appears within functional limits for tasks assessed   Oral / Motor  Oral Motor/Sensory Function Overall Oral Motor/Sensory Function: Mild impairment Facial ROM: Within Functional Limits Facial Symmetry: Within Functional Limits Facial Strength: Within Functional Limits Facial Sensation: Within Functional Limits Lingual Symmetry: Within Functional Limits Lingual Strength: Reduced Lingual Sensation: Within Functional Limits Velum: Within Functional Limits Mandible: Within Functional Limits Motor Speech Overall Motor Speech: Impaired Respiration: Within functional limits Phonation: Normal Resonance: Within functional limits Articulation: Impaired Level of Impairment: Conversation Intelligibility: Intelligibility reduced Word: 75-100% accurate Phrase: 75-100% accurate Sentence: 75-100% accurate Conversation: 75-100% accurate Motor Planning: Witnin functional limits Motor Speech Errors: Not applicable   GO                   Sonia Baller, MA, [place] [facility] Pager: [phone]

## 2020-07-02 NOTE — Progress Notes (Signed)
  Echocardiogram 2D Echocardiogram has been performed.  [name] 07/02/2020, 11:52 AM

## 2020-07-02 NOTE — Progress Notes (Signed)
ANTICOAGULATION CONSULT NOTE - Follow-Up Consult  Pharmacy Consult for Warfarin Indication: mechanical MVR  Allergies  Allergen Reactions  . Zoloft [Sertraline] Hives, Itching and Rash    Patient Measurements: Height: 5\' 7"  (170.2 cm) Weight: 60.3 kg (133 lb) IBW/kg (Calculated) : 61.6  Vital Signs: BP: 124/80 (08/25 0700) Pulse Rate: 67 (08/25 0700)  Labs: Recent Labs    06/30/20 0000 07/01/20 0612 07/02/20 0433  HGB  --  13.6  --   HCT  --  42.9  --   PLT  --  188  --   LABPROT  --   --  29.1*  INR 3.2*  --  2.9*  CREATININE  --  0.77  --     Estimated Creatinine Clearance: 58.7 mL/min (by C-G formula based on SCr of 0.77 mg/dL).   Medical History: Past Medical History:  Diagnosis Date  . Abnormality of gait 09/07/2016  . Allergy   . Diabetes mellitus without complication [facility])    Patient denies this - notes history of glucose intolerance  . GERD (gastroesophageal reflux disease)   . Hemiparesis and alteration of sensations as late effects of stroke ([place]) 09/07/2016  . History of pneumonia 1997  . Hypertension   . S/P MVR (mitral valve replacement)    Mechanical mitral valve replacement at age 42 (done in [place])  // echo 7/17: EF 55-60, normal wall motion, bileaflet mechanical mitral valve prosthesis functioning normally, mild LAE, mildly reduced RVSF, small pericardial effusion  . Stroke [facility]) 1997, 2013, 2015    Medications:  See electronic med rec  Assessment: 74 y.o. presents with dizziness, now with acute ischemic stroke Pt on warfarin PTA for mechanical MVR and h/o hemispheric strokes. INR 8/23 0000 was therapeutic (3.2). Admission INR pending. CBC ok on admission. Neurology agrees with continuing coumadin due benefits for heart valve outweigh due to low risk for hemorrhagic transformation with small stroke size.  Home dose: Warfarin 5mg  except for 7.5mg  Mon and Fri  The patient's INR this morning is slightly SUBtherapeutic of neuro's higher INR  goal of 3-3.5 (INR today 2.9). The patient's last dose PTA was on 8/23, and did not receive a dose yesterday. Will give a higher dose today to make up for the missed dose.   Goal of Therapy:  INR goal 3-3.5 Monitor platelets by anticoagulation protocol: Yes   Plan:  - Warfarin 7.5 mg x 1 at 1600 today - Daily PT/INR, CBC q72h - Will continue to monitor for any signs/symptoms of bleeding and will follow up with PT/INR in the a.m.    Thank you for allowing pharmacy to be a part of this patient's care.  Alycia Rossetti, PharmD, BCPS Clinical Pharmacist Clinical phone for 07/02/2020: [phone] 07/02/2020 7:57 AM   **Pharmacist phone directory can now be found on [email or web address] ([email or web address]).  Listed under [place].

## 2020-07-02 NOTE — ED Notes (Signed)
Patient transported to CT 

## 2020-07-03 DIAGNOSIS — I10 Essential (primary) hypertension: Secondary | ICD-10-CM

## 2020-07-03 DIAGNOSIS — Z7901 Long term (current) use of anticoagulants: Secondary | ICD-10-CM

## 2020-07-03 DIAGNOSIS — Z7189 Other specified counseling: Secondary | ICD-10-CM

## 2020-07-03 DIAGNOSIS — E785 Hyperlipidemia, unspecified: Secondary | ICD-10-CM

## 2020-07-03 DIAGNOSIS — H8110 Benign paroxysmal vertigo, unspecified ear: Secondary | ICD-10-CM

## 2020-07-03 DIAGNOSIS — Z515 Encounter for palliative care: Secondary | ICD-10-CM

## 2020-07-03 LAB — PROTIME-INR
INR: 2.4 — ABNORMAL HIGH (ref 0.8–1.2)
Prothrombin Time: 25.6 seconds — ABNORMAL HIGH (ref 11.4–15.2)

## 2020-07-03 MED ORDER — WARFARIN SODIUM 7.5 MG PO TABS
7.5000 mg | ORAL_TABLET | Freq: Once | ORAL | Status: AC
  Administered 2020-07-03: 7.5 mg via ORAL
  Filled 2020-07-03: qty 1

## 2020-07-03 MED ORDER — ENOXAPARIN SODIUM 60 MG/0.6ML ~~LOC~~ SOLN
60.0000 mg | Freq: Two times a day (BID) | SUBCUTANEOUS | Status: DC
  Administered 2020-07-03 (×2): 60 mg via SUBCUTANEOUS
  Filled 2020-07-03 (×2): qty 0.6

## 2020-07-03 NOTE — Evaluation (Signed)
Occupational Therapy Evaluation Patient Details Name: [name] MRN: [id number] DOB: 1946/07/26 Today's Date: 07/03/2020    History of Present Illness Pt is a 74 y.o. 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  Incidental finding of small L frontal lobe possible infarct.    Clinical Impression   PTA patient reports independent with ADLs (basin bathing only), light IADLs and using no AD for mobility within her home and a cane in the community.  Admitted for above and limited by problem list below, including L sided weakness (but known hx of CVA with mild L spastic hemiparesis), impaired balance, decreased activity tolerance.  Patient currently requires min assist for ADLs, min assist for transfers and min assist for limited in room mobility using RW.  Believe she will benefit from continued OT services while admitted and after dc at CIR level for intensive rehab to optimize return to PLOF with ADLs/mobility.  Will follow acutely.     Follow Up Recommendations  CIR;Supervision/Assistance - 24 hour    Equipment Recommendations  3 in 1 bedside commode    Recommendations for Other Services       Precautions / Restrictions Precautions Precautions: Fall Required Braces or Orthoses: Other Brace Other Brace: L AFO Restrictions Weight Bearing Restrictions: No      Mobility Bed Mobility Overal bed mobility: Needs Assistance Bed Mobility: Supine to Sit     Supine to sit: Supervision     General bed mobility comments: increased time and effort, no physical assist required   Transfers Overall transfer level: Needs assistance Equipment used: Rolling walker (2 wheeled) Transfers: Sit to/from Stand Sit to Stand: Min assist         General transfer comment: min assist to power up and steady from EOB, increased time  and effort with cueing for hand placement and safety     Balance Overall balance assessment: Needs assistance Sitting-balance support: No upper extremity supported;Feet supported Sitting balance-Leahy Scale: Good     Standing balance support: Bilateral upper extremity supported;During functional activity Standing balance-Leahy Scale: Fair Standing balance comment: relies on UE support dynamically                            ADL either performed or assessed with clinical judgement   ADL Overall ADL's : Needs assistance/impaired     Grooming: Min guard;Standing   Upper Body Bathing: Minimal assistance;Sitting   Lower Body Bathing: Minimal assistance;Sit to/from stand   Upper Body Dressing : Minimal assistance;Sitting   Lower Body Dressing: Minimal assistance;Sit to/from stand Lower Body Dressing Details (indicate cue type and reason): able to don shoes with min assist, min assist sit to stand  Toilet Transfer: Minimal assistance;Ambulation;Regular Toilet;RW   Toileting- Clothing Manipulation and Hygiene: Minimal assistance Toileting - Clothing Manipulation Details (indicate cue type and reason): clothing mgmt      Functional mobility during ADLs: Minimal assistance;Rolling walker General ADL Comments: pt limited by L sided weakness, impaired balance and decrased activity tolerance     Vision Baseline Vision/History: Wears glasses;Cataracts Wears Glasses: At all times Patient Visual Report: No change from baseline Vision Assessment?: No apparent visual deficits     Perception     Praxis      Pertinent Vitals/Pain Pain Assessment: No/denies pain     Hand Dominance Right   Extremity/Trunk Assessment Upper  Extremity Assessment Upper Extremity Assessment: LUE deficits/detail LUE Deficits / Details: hx of prior CVA with L sided spastic hemiparesis; able to use functionally with mild discoordination  LUE Sensation: WNL LUE Coordination: decreased fine  motor;decreased gross motor   Lower Extremity Assessment Lower Extremity Assessment: Defer to PT evaluation       Communication Communication Communication: No difficulties   Cognition Arousal/Alertness: Awake/alert Behavior During Therapy: WFL for tasks assessed/performed Overall Cognitive Status: Within Functional Limits for tasks assessed                                     General Comments       Exercises     Shoulder Instructions      Home Living Family/patient expects to be discharged to:: Private residence Living Arrangements: Spouse/significant other Available Help at Discharge: Family;Neighbor;Available 24 hours/day Type of Home: House (town house ) Home Access: Level entry     Home Layout: Able to live on main level with bedroom/bathroom;Two level     Bathroom Shower/Tub: Walk-in shower (pt sponge bathes only )   Bathroom Toilet: Standard     Home Equipment: Walker - 2 wheels;Cane - single point          Prior Functioning/Environment Level of Independence: Needs assistance  Gait / Transfers Assistance Needed: uses cane in commuinty, nothing at home; L AFO 24/7 ADL's / Homemaking Assistance Needed: sponge bathes, independent ADLS; light IADLs             OT Problem List: Decreased strength;Decreased activity tolerance;Impaired balance (sitting and/or standing);Decreased coordination;Decreased knowledge of precautions;Impaired UE functional use;Impaired tone      OT Treatment/Interventions: Self-care/ADL training;Therapeutic exercise;Therapeutic activities;Cognitive remediation/compensation;Patient/family education;Balance training;DME and/or AE instruction    OT Goals(Current goals can be found in the care plan section) Acute Rehab OT Goals Patient Stated Goal: to walk better  OT Goal Formulation: With patient Time For Goal Achievement: 07/17/20 Potential to Achieve Goals: Good  OT Frequency: Min 2X/week   Barriers to D/C:             Co-evaluation              AM-PAC OT "6 Clicks" Daily Activity     Outcome Measure Help from another person eating meals?: A Little Help from another person taking care of personal grooming?: A Little Help from another person toileting, which includes using toliet, bedpan, or urinal?: A Little Help from another person bathing (including washing, rinsing, drying)?: A Little Help from another person to put on and taking off regular upper body clothing?: A Little Help from another person to put on and taking off regular lower body clothing?: A Little 6 Click Score: 18   End of Session Equipment Utilized During Treatment: Gait belt;Rolling walker Nurse Communication: Mobility status  Activity Tolerance: Patient tolerated treatment well Patient left: in chair;Other (comment) (handoff to PT )  OT Visit Diagnosis: Other abnormalities of gait and mobility (R26.89);Muscle weakness (generalized) (M62.81);Other symptoms and signs involving the nervous system (R29.898)                Time: 9476-5465 OT Time Calculation (min): 32 min Charges:  OT General Charges $OT Visit: 1 Visit OT Evaluation $OT Eval Moderate Complexity: 1 Mod OT Treatments $Self Care/Home Management : 8-22 mins  Jolaine Artist, OT Acute Rehabilitation Services Pager [phone] Office [phone]    Delight Stare 07/03/2020, 2:48 PM

## 2020-07-03 NOTE — Evaluation (Signed)
Physical Therapy Evaluation Patient Details Name: [name] MRN: [id number] DOB: Apr 18, 1946 Today's Date: 07/03/2020   History of Present Illness  Pt is a 74 y.o. 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  Incidental finding of small L frontal lobe possible infarct.   Clinical Impression  Pt admitted with/for s/s of stroke with dizziness and more L LE coordination issues.  Pt needing min assist for mobility a this time.  Pt currently limited functionally due to the problems listed. ( See problems list.)   Pt will benefit from PT to maximize function and safety in order to get ready for next venue listed below.     Follow Up Recommendations CIR;Supervision/Assistance - 24 hour;Other (comment)    Equipment Recommendations  None recommended by PT    Recommendations for Other Services Rehab consult     Precautions / Restrictions Precautions Precautions: Fall Required Braces or Orthoses: Other Brace Other Brace: L AFO      Mobility  Bed Mobility               General bed mobility comments: Up with OT on arrival  Transfers Overall transfer level: Needs assistance Equipment used: Rolling walker (2 wheeled) Transfers: Sit to/from Stand Sit to Stand: Min assist         General transfer comment: min assist to power up and steady from EOB, increased time and effort with cueing for hand placement and safety   Ambulation/Gait Ambulation/Gait assistance: Min assist Gait Distance (Feet): 80 Feet (total with RW and without RW, but use of the rail) Assistive device: Rolling walker (2 wheeled) (rail) Gait Pattern/deviations: Step-through pattern;Decreased step length - left;Decreased step length - right;Decreased stance time - left;Decreased stride length Gait velocity: slower Gait velocity interpretation: <1.8  ft/sec, indicate of risk for recurrent falls General Gait Details: Pt's gait is spastic and paretic in nature with and IR/ER waggle with each step.  No toe off and flat foot contact. She is likely more steady with the RW, but her left foot occasionally hits the RW.  With the rail she is still fairly stable, if not slow and paretic  Stairs            Wheelchair Mobility    Modified Rankin (Stroke Patients Only) Modified Rankin (Stroke Patients Only) Pre-Morbid Rankin Score: Slight disability Modified Rankin: Moderately severe disability     Balance Overall balance assessment: Needs assistance Sitting-balance support: No upper extremity supported;Feet supported Sitting balance-Leahy Scale: Good     Standing balance support: Bilateral upper extremity supported;During functional activity Standing balance-Leahy Scale: Fair Standing balance comment: relies on UE support dynamically                              Pertinent Vitals/Pain Pain Assessment: No/denies pain    Home Living Family/patient expects to be discharged to:: Private residence Living Arrangements: Spouse/significant other Available Help at Discharge: Family;Neighbor;Available 24 hours/day Type of Home: House (town house ) Home Access: Level entry     Home Layout: Able to live on main level with bedroom/bathroom;Two level Home Equipment: Walker - 2 wheels;Cane - single point      Prior Function Level of Independence: Needs assistance   Gait / Transfers Assistance Needed: uses cane in commuinty, nothing at home; L AFO 24/7  ADL's / Homemaking Assistance Needed:  sponge bathes, independent ADLS; light IADLs         Hand Dominance   Dominant Hand: Right    Extremity/Trunk Assessment        Lower Extremity Assessment Lower Extremity Assessment: RLE deficits/detail;LLE deficits/detail RLE Deficits / Details: No overt change in R LE function even with small L frontal infarct. RLE Sensation:  WNL LLE Deficits / Details: mild weakness and coordination issues,  wears AFO at all times. LLE Sensation: WNL LLE Coordination: decreased fine motor (coordination is "spastic" in nature.)       Communication   Communication: No difficulties  Cognition Arousal/Alertness: Awake/alert Behavior During Therapy: WFL for tasks assessed/performed Overall Cognitive Status: Within Functional Limits for tasks assessed                                        General Comments      Exercises     Assessment/Plan    PT Assessment Patient needs continued PT services  PT Problem List Decreased strength;Decreased activity tolerance;Decreased balance;Decreased mobility;Decreased coordination       PT Treatment Interventions Gait training;Functional mobility training;Therapeutic activities;Balance training;Patient/family education;DME instruction;Stair training    PT Goals (Current goals can be found in the Care Plan section)  Acute Rehab PT Goals Patient Stated Goal: to walk better  PT Goal Formulation: With patient Time For Goal Achievement: 07/17/20 Potential to Achieve Goals: Good    Frequency Min 4X/week   Barriers to discharge        Co-evaluation               AM-PAC PT "6 Clicks" Mobility  Outcome Measure Help needed turning from your back to your side while in a flat bed without using bedrails?: None Help needed moving from lying on your back to sitting on the side of a flat bed without using bedrails?: A Little Help needed moving to and from a bed to a chair (including a wheelchair)?: A Little Help needed standing up from a chair using your arms (e.g., wheelchair or bedside chair)?: A Little Help needed to walk in hospital room?: A Little Help needed climbing 3-5 steps with a railing? : A Little 6 Click Score: 19    End of Session   Activity Tolerance: Patient tolerated treatment well Patient left: in chair;with call bell/phone within reach Nurse  Communication: Mobility status PT Visit Diagnosis: Other abnormalities of gait and mobility (R26.89);Other symptoms and signs involving the nervous system (R29.898);Difficulty in walking, not elsewhere classified (R26.2)    Time: 1351-1416 PT Time Calculation (min) (ACUTE ONLY): 25 min   Charges:   PT Evaluation $PT Eval Moderate Complexity: 1 Mod PT Treatments $Gait Training: 8-22 mins        07/03/2020  Ginger Carne., PT Acute Rehabilitation Services [phone]  (pager) [phone]  (office)  Tessie Fass Jacob Cicero 07/03/2020, 5:46 PM

## 2020-07-03 NOTE — Discharge Instructions (Signed)

## 2020-07-03 NOTE — Progress Notes (Signed)
PROGRESS NOTE    [name]  GGY:[id number] DOB: November 06, 1946 DOA: 07/01/2020 PCP: Haywood Pao, MD    Chief Complaint  Patient presents with  . Dizziness  . 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  Patient reports her symptoms started 3 days prior to admission.  MRI of the brain was done showed cortical areas of acute ischemic stroke in the left hemisphere.  INR was done as an outpatient yesterday and therapeutic at 3.2.  Neurologist consulted for recommendations.  Recommends aspirin 81 mg daily in addition to Coumadin with INR goal of 3-3.5.  Patient seen and examined this morning denies any new complaints.   Assessment & Plan:   Principal Problem:   Acute CVA (cerebrovascular accident) ([place]) Active Problems:   Hemiparesis (L sided - mild) due to old stroke [facility])   H/O mitral valve replacement with mechanical valve   HTN (hypertension)   Long term (current) use of anticoagulants [Z79.01]   Hyperlipemia   Vertigo   Goals of care, counseling/discussion   Palliative care by specialist   Acute left hemispheric CVA ~Recommended to continue with Coumadin with an INR goal of 3-3.5. Get echocardiogram showing Left ventricular ejection fraction, by estimation, is 55 to 60%. The left ventricle has normal function. The left ventricle has no regional wall motion abnormalities. There is mild concentric left ventricular hypertrophy. Left ventricular diastolic function could not be evaluated.,  CT angiogram of the head and neck  shows No emergent finding.  Mild atherosclerosis without flow limiting stenosis or embolic source identified in the head and neck.  SLP eval passed swallowing evaluation. PT OT evaluations recommending CIR, CIR consult in place Neurology  consulted recommended continue with aspirin and Coumadin with an INR goal of 3-3.5. Today's INR is 2.4, Lovenox added for bridge after discussing with neurology. Hemoglobin A1c is 6.1.  LDL is 75. Continue with aspirin 81 mg and Lipitor 40 mg daily.   Hyperlipidemia with Lipitor 40 mg daily   History of mitral valve replacement with mechanical valve Continue with Coumadin this morning, INR is subtherapeutic at 2.4 and Lovenox to bridge.    Hypertension Continue with Norvasc.  Dizziness, vertigo, history of BPPV PT evaluation recommending CIR, consult placed.   Due to recurrent symptoms, multiple comorbidities, cognitive deficits, deconditioning and debility palliative care consulted for goals of care discussion.   DVT prophylaxis: Coumadin Code Status full code Family Communication: None at bedside, discussed with family over the phone. Disposition:   Status is: Inpatient  Remains inpatient appropriate because:Ongoing diagnostic testing needed not appropriate for outpatient work up, Unsafe d/c plan and Inpatient level of care appropriate due to severity of illness   Dispo: The patient is from: Home              Anticipated d/c is to: CIR              Anticipated d/c date is: 1 day              Patient currently is not medically stable to d/c.       Consultants:   Neurology.    Procedures:CT angio of the head and neck.   Echocardiogram.    Antimicrobials: none.    Subjective: No new complaints  Objective: Vitals:   07/03/20 0406 07/03/20 0414 07/03/20 0740 07/03/20 1124  BP: (!) 87/67 106/71 108/70 115/79  Pulse: 93  74 72  Resp: 17  14 16   Temp: (!) 97.5 F (36.4 C)  97.7 F (36.5 C) 98.6 F (37 C)  TempSrc: Oral  Oral Oral  SpO2: 97%  96% 98%  Weight:      Height:        Intake/Output Summary (Last 24 hours) at 07/03/2020 1540 Last data filed at 07/03/2020 0741 Gross per 24 hour  Intake 360 ml  Output --  Net 360 ml   Filed Weights    07/01/20 0558  Weight: 60.3 kg    Examination:  General exam: Alert and comfortable Respiratory system: Clear to auscultation bilaterally, no evidence of wheezing or rhonchi cardiovascular system: S1-S2 heard, regular rate rhythm, no JVD Gastrointestinal system: Abdomen is soft, nontender, nondistended bowel sounds normal Central nervous system:  alert and improving left-sided weakness, oriented to place and person only.   Extremities: No pedal edema Skin: No rashes seen Psychiatry: Mood is appropriate   Data Reviewed: I have personally reviewed following labs and imaging studies  CBC: Recent Labs  Lab 07/01/20 0612  WBC 4.9  HGB 13.6  HCT 42.9  MCV 92.1  PLT 629    Basic Metabolic Panel: Recent Labs  Lab 07/01/20 0612  NA 142  K 3.8  CL 105  CO2 27  GLUCOSE 139*  BUN 20  CREATININE 0.77  CALCIUM 10.2    GFR: Estimated Creatinine Clearance: 58.7 mL/min (by C-G formula based on SCr of 0.77 mg/dL).  Liver Function Tests: No results for input(s): AST, ALT, ALKPHOS, BILITOT, PROT, ALBUMIN in the last 168 hours.  CBG: No results for input(s): GLUCAP in the last 168 hours.   Recent Results (from the past 240 hour(s))  SARS Coronavirus 2 by RT PCR (hospital order, performed in [facility] hospital lab) Nasopharyngeal Nasopharyngeal Swab     Status: None   Collection Time: 07/01/20  9:58 PM   Specimen: Nasopharyngeal Swab  Result Value Ref Range Status   SARS Coronavirus 2 NEGATIVE NEGATIVE Final    Comment: (NOTE) SARS-CoV-2 target nucleic acids are NOT DETECTED.  The SARS-CoV-2 RNA is generally detectable in upper and lower respiratory specimens during the acute phase of infection. The lowest concentration of SARS-CoV-2 viral copies this assay can detect is 250 copies / mL. A negative result does not preclude SARS-CoV-2 infection and should not be used as the sole basis for treatment or other patient management decisions.  A negative result may occur  with improper specimen collection / handling, submission of specimen other than nasopharyngeal swab, presence of viral mutation(s) within the areas targeted by this assay, and inadequate number of viral copies (<250 copies / mL). A negative result must be combined with clinical observations, patient history, and epidemiological information.  Fact Sheet for Patients:   StrictlyIdeas.no  Fact Sheet for Healthcare Providers: BankingDealers.co.za  This test is not yet approved or  cleared by the [place] FDA and has been authorized for detection and/or diagnosis of SARS-CoV-2 by FDA under an Emergency Use Authorization (EUA).  This EUA will remain in effect (meaning this test can be used) for the duration of the COVID-19 declaration under Section 564(b)(1) of the Act, 21 U.S.C. section 360bbb-3(b)(1), unless the authorization is terminated or revoked sooner.  Performed at [facility], [place] [address] [place], [place] [address]          Radiology Studies: CT ANGIO HEAD W OR WO CONTRAST  Result  Date: 07/02/2020 CLINICAL DATA:  Dizziness and weakness. History of stroke with residual left-sided weakness. EXAM: CT ANGIOGRAPHY HEAD AND NECK TECHNIQUE: Multidetector CT imaging of the head and neck was performed using the standard protocol during bolus administration of intravenous contrast. Multiplanar CT image reconstructions and MIPs were obtained to evaluate the vascular anatomy. Carotid stenosis measurements (when applicable) are obtained utilizing NASCET criteria, using the distal internal carotid diameter as the denominator. CONTRAST:  47mL OMNIPAQUE IOHEXOL 350 MG/ML SOLN COMPARISON:  Brain MRI from yesterday.  CTA head neck 09/04/2019 FINDINGS: CT HEAD FINDINGS Brain: The patient's left frontal acute infarct is not readily identified by noncontrast CT. Chronic encephalomalacia and high-density nodule centered in the right  parietal lobe, deep to a craniotomy. There is chronic small vessel ischemia including a remote lacunar infarct at the left thalamus. Right more than left small cerebellar infarcts. No acute hemorrhage, hydrocephalus, or masslike finding Vascular: See below Skull: Unremarkable right parietal craniotomy. No destructive findings Sinuses: Clear Orbits: Negative CTA NECK FINDINGS Aortic arch: Limited coverage of the arch which shows no dilatation or acute finding. Right carotid system: Mild atheromatous changes at the proximal ICA. No dissection or beading. Left carotid system: Mild atheromatous plaque at the carotid bifurcation. No stenosis, ulceration, or beading. Vertebral arteries: Proximal subclavian atherosclerosis without flow limiting stenosis or ulceration. The vertebral arteries are smooth and widely patent to the dura. Skeleton: Cervical disc and facet degeneration. No acute or aggressive finding Other neck: No significant incidental finding. Upper chest: Negative Review of the MIP images confirms the above findings CTA HEAD FINDINGS Anterior circulation: Less than typical atheromatous changes on the siphons. No branch occlusion, beading, or aneurysm. Posterior circulation: Vertebral and basilar arteries are smooth and widely patent. Unremarkable cerebellar branching. Hypoplastic left P1 segment. Venous sinuses: Diffusely patent Anatomic variants: As above Review of the MIP images confirms the above findings IMPRESSION: 1. No emergent finding. 2. Mild atherosclerosis without flow limiting stenosis or embolic source identified in the head and neck. 3. Intracranial findings as described on preceding brain MRI. Electronically Signed   By: Monte Fantasia M.D.   On: 07/02/2020 04:46   CT ANGIO NECK W OR WO CONTRAST  Result Date: 07/02/2020 CLINICAL DATA:  Dizziness and weakness. History of stroke with residual left-sided weakness. EXAM: CT ANGIOGRAPHY HEAD AND NECK TECHNIQUE: Multidetector CT imaging of the  head and neck was performed using the standard protocol during bolus administration of intravenous contrast. Multiplanar CT image reconstructions and MIPs were obtained to evaluate the vascular anatomy. Carotid stenosis measurements (when applicable) are obtained utilizing NASCET criteria, using the distal internal carotid diameter as the denominator. CONTRAST:  25mL OMNIPAQUE IOHEXOL 350 MG/ML SOLN COMPARISON:  Brain MRI from yesterday.  CTA head neck 09/04/2019 FINDINGS: CT HEAD FINDINGS Brain: The patient's left frontal acute infarct is not readily identified by noncontrast CT. Chronic encephalomalacia and high-density nodule centered in the right parietal lobe, deep to a craniotomy. There is chronic small vessel ischemia including a remote lacunar infarct at the left thalamus. Right more than left small cerebellar infarcts. No acute hemorrhage, hydrocephalus, or masslike finding Vascular: See below Skull: Unremarkable right parietal craniotomy. No destructive findings Sinuses: Clear Orbits: Negative CTA NECK FINDINGS Aortic arch: Limited coverage of the arch which shows no dilatation or acute finding. Right carotid system: Mild atheromatous changes at the proximal ICA. No dissection or beading. Left carotid system: Mild atheromatous plaque at the carotid bifurcation. No stenosis, ulceration, or beading. Vertebral arteries: Proximal subclavian atherosclerosis without  flow limiting stenosis or ulceration. The vertebral arteries are smooth and widely patent to the dura. Skeleton: Cervical disc and facet degeneration. No acute or aggressive finding Other neck: No significant incidental finding. Upper chest: Negative Review of the MIP images confirms the above findings CTA HEAD FINDINGS Anterior circulation: Less than typical atheromatous changes on the siphons. No branch occlusion, beading, or aneurysm. Posterior circulation: Vertebral and basilar arteries are smooth and widely patent. Unremarkable cerebellar  branching. Hypoplastic left P1 segment. Venous sinuses: Diffusely patent Anatomic variants: As above Review of the MIP images confirms the above findings IMPRESSION: 1. No emergent finding. 2. Mild atherosclerosis without flow limiting stenosis or embolic source identified in the head and neck. 3. Intracranial findings as described on preceding brain MRI. Electronically Signed   By: Monte Fantasia M.D.   On: 07/02/2020 04:46   MR BRAIN WO CONTRAST  Result Date: 07/01/2020 CLINICAL DATA:  Initial evaluation for neuro deficit, stroke suspected. EXAM: MRI HEAD WITHOUT CONTRAST TECHNIQUE: Multiplanar, multiecho pulse sequences of the brain and surrounding structures were obtained without intravenous contrast. COMPARISON:  Prior Study from 09/03/2019. FINDINGS: Brain: Moderate to advanced generalized age-related cerebral atrophy. Patchy and confluent T2/FLAIR hyperintensity within the periventricular deep white matter both cerebral hemispheres most consistent with chronic small vessel ischemic disease, moderate in nature. Multiple scattered remote lacunar infarcts noted involving the left thalamus. Multiple scattered small remote bilateral cerebellar infarcts noted as well. Large area of encephalomalacia and gliosis involving the cortical and subcortical right parietal lobe again noted. Associated scattered areas of chronic hemosiderin staining. Sequelae of overlying right craniotomy. Few scattered foci of diffusion abnormality within this region favored to be related to susceptibility artifact rather than acute ischemia. Single small curvilinear cortical subcortical ischemic infarcts seen involving the parasagittal posterior left frontal lobe (series 2, images 43, 40, 39). No associated hemorrhage or mass effect. No other convincing foci of restricted diffusion to suggest acute or subacute ischemia. Gray-white matter differentiation otherwise maintained. No acute intracranial hemorrhage. Additional single punctate  chronic microhemorrhage noted within the left temporal lobe. No mass lesion, midline shift or mass effect. Mild ventricular prominence related global parenchymal volume loss without hydrocephalus. Mild ex vacuo dilatation of the right lateral ventricle related to the right parietal encephalomalacia noted as well. No extra-axial fluid collection. Pituitary gland suprasellar region normal. Midline structures intact. Vascular: Major intracranial vascular flow voids are maintained. Skull and upper cervical spine: Craniocervical junction within normal limits. Bone marrow signal intensity normal. Remote right parietal craniotomy noted. No acute scalp soft tissue abnormality. Sinuses/Orbits: Globes and orbital soft tissues within normal limits. Paranasal sinuses are largely clear. No significant mastoid effusion. Other: None. IMPRESSION: 1. Single small curvilinear cortical to subcortical ischemic nonhemorrhagic infarct involving the parasagittal posterior left frontal lobe. 2. No other acute intracranial abnormality. 3. Chronic right parietal encephalomalacia and gliosis with associated chronic hemosiderin staining and sequelae of overlying right parietal craniotomy, stable. 4. Underlying moderate to advanced age-related cerebral atrophy with chronic microvascular ischemic disease, with multiple remote lacunar infarcts involving the left thalamus and bilateral cerebellar hemispheres. Electronically Signed   By: Jeannine Boga M.D.   On: 07/01/2020 20:51   ECHOCARDIOGRAM COMPLETE  Result Date: 07/02/2020    ECHOCARDIOGRAM REPORT   Patient Name:   [name] Date of Exam: 07/02/2020 Medical Rec #:  [id number]      Height:       67.0 in Accession #:    [id number]     Weight:  133.0 lb Date of Birth:  [birth year]      BSA:          1.700 m Patient Age:    71 years       BP:           123/74 mmHg Patient Gender: F              HR:           76 bpm. Exam Location:  Inpatient Procedure: 2D Echo, Cardiac Doppler  and Color Doppler Indications:    Stroke  History:        Patient has prior history of Echocardiogram examinations, most                 recent 09/04/2019. Risk Factors:Diabetes, Hypertension and                 Dyslipidemia. CVA. H/O mechanical mitral valve replacement.                  Mitral Valve: mechanical valve valve is present in the mitral                 position.  Sonographer:    Clayton Lefort RDCS (AE) Referring Phys: [id number] [facility]  1. Left ventricular ejection fraction, by estimation, is 55 to 60%. The left ventricle has normal function. The left ventricle has no regional wall motion abnormalities. There is mild concentric left ventricular hypertrophy. Left ventricular diastolic function could not be evaluated.  2. Right ventricular systolic function is normal. The right ventricular size is normal. There is normal pulmonary artery systolic pressure. The estimated right ventricular systolic pressure is 32.4 mmHg.  3. Right atrial size was mildly dilated.  4. Mechanical mitral valve replacement. Normal gradient. EOA 1.84 cm2, DI 1.7, E max 1.1 m/s, PHT 99 msec. The mitral valve has been repaired/replaced. No evidence of mitral valve regurgitation. The mean mitral valve gradient is 3.0 mmHg with average heart rate of 76 bpm. There is a mechanical valve present in the mitral position. Echo findings are consistent with normal structure and function of the mitral valve prosthesis.  5. The aortic valve is tricuspid. Aortic valve regurgitation is not visualized. Mild aortic valve sclerosis is present, with no evidence of aortic valve stenosis.  6. The inferior vena cava is normal in size with greater than 50% respiratory variability, suggesting right atrial pressure of 3 mmHg. Comparison(s): No significant change from prior study. FINDINGS  Left Ventricle: Left ventricular ejection fraction, by estimation, is 55 to 60%. The left ventricle has normal function. The left ventricle has no  regional wall motion abnormalities. The left ventricular internal cavity size was normal in size. There is  mild concentric left ventricular hypertrophy. Abnormal (paradoxical) septal motion consistent with post-operative status. Left ventricular diastolic function could not be evaluated due to mitral valve replacement. Left ventricular diastolic function could not be evaluated. Right Ventricle: The right ventricular size is normal. No increase in right ventricular wall thickness. Right ventricular systolic function is normal. There is normal pulmonary artery systolic pressure. The tricuspid regurgitant velocity is 2.43 m/s, and  with an assumed right atrial pressure of 3 mmHg, the estimated right ventricular systolic pressure is 40.1 mmHg. Left Atrium: Left atrial size was not well visualized. Right Atrium: Right atrial size was mildly dilated. Pericardium: A small pericardial effusion is present. The pericardial effusion is circumferential. Mitral Valve: Mechanical mitral valve replacement. Normal gradient. EOA 1.84 cm2, DI 1.7,  E max 1.1 m/s, PHT 99 msec. The mitral valve has been repaired/replaced. No evidence of mitral valve regurgitation. There is a mechanical valve present in the mitral position. Echo findings are consistent with normal structure and function of the mitral valve prosthesis. MV peak gradient, 5.5 mmHg. The mean mitral valve gradient is 3.0 mmHg with average heart rate of 76 bpm. Tricuspid Valve: The tricuspid valve is grossly normal. Tricuspid valve regurgitation is mild . No evidence of tricuspid stenosis. Aortic Valve: The aortic valve is tricuspid. . There is mild thickening and mild calcification of the aortic valve. Aortic valve regurgitation is not visualized. Mild aortic valve sclerosis is present, with no evidence of aortic valve stenosis. There is mild thickening of the aortic valve. There is mild calcification of the aortic valve. Aortic valve mean gradient measures 2.0 mmHg. Aortic  valve peak gradient measures 4.2 mmHg. Aortic valve area, by VTI measures 2.26 cm. Pulmonic Valve: The pulmonic valve was grossly normal. Pulmonic valve regurgitation is trivial. No evidence of pulmonic stenosis. Aorta: The aortic root and ascending aorta are structurally normal, with no evidence of dilitation. Venous: The inferior vena cava is normal in size with greater than 50% respiratory variability, suggesting right atrial pressure of 3 mmHg. IAS/Shunts: The atrial septum is grossly normal.  LEFT VENTRICLE PLAX 2D LVIDd:         3.50 cm LVIDs:         2.30 cm LV PW:         1.40 cm LV IVS:        1.20 cm LVOT diam:     2.00 cm LV SV:         46 LV SV Index:   27 LVOT Area:     3.14 cm  RIGHT VENTRICLE             IVC RV Basal diam:  3.30 cm     IVC diam: 1.00 cm RV S prime:     10.70 cm/s TAPSE (M-mode): 2.2 cm LEFT ATRIUM             Index       RIGHT ATRIUM           Index LA diam:        2.80 cm 1.65 cm/m  RA Area:     21.30 cm LA Vol (A2C):   63.7 ml 37.46 ml/m RA Volume:   65.30 ml  38.40 ml/m LA Vol (A4C):   48.7 ml 28.64 ml/m LA Biplane Vol: 56.3 ml 33.11 ml/m  AORTIC VALVE AV Area (Vmax):    2.21 cm AV Area (Vmean):   2.05 cm AV Area (VTI):     2.26 cm AV Vmax:           102.00 cm/s AV Vmean:          74.800 cm/s AV VTI:            0.206 m AV Peak Grad:      4.2 mmHg AV Mean Grad:      2.0 mmHg LVOT Vmax:         71.80 cm/s LVOT Vmean:        48.800 cm/s LVOT VTI:          0.148 m LVOT/AV VTI ratio: 0.72  AORTA Ao Root diam: 3.80 cm Ao Asc diam:  3.40 cm MITRAL VALVE            TRICUSPID VALVE MV Area VTI:  1.84 cm  TR Peak grad:   23.6 mmHg MV Peak grad: 5.5 mmHg  TR Vmax:        243.00 cm/s MV Mean grad: 3.0 mmHg MV Vmax:      1.17 m/s  SHUNTS MV Vmean:     83.5 cm/s Systemic VTI:  0.15 m MV VTI:       0.25 m    Systemic Diam: 2.00 cm Eleonore Chiquito MD Electronically signed by Eleonore Chiquito MD Signature Date/Time: 07/02/2020/12:55:23 PM    Final         Scheduled Meds: . amLODipine   10 mg Oral QHS  . aspirin EC  81 mg Oral Daily  . atorvastatin  40 mg Oral QHS  . docusate sodium  100 mg Oral QHS  . enoxaparin (LOVENOX) injection  60 mg Subcutaneous BID  . pantoprazole  40 mg Oral Daily  . Warfarin - Pharmacist Dosing Inpatient   Oral q1600   Continuous Infusions:    LOS: 2 days        Hosie Poisson, MD [facility]   To contact the attending provider between 7A-7P or the covering provider during after hours 7P-7A, please log into the web site [email or web address] and access using universal [place] password for that web site. If you do not have the password, please call the hospital operator.  07/03/2020, 3:40 PM

## 2020-07-03 NOTE — Progress Notes (Addendum)
ANTICOAGULATION CONSULT NOTE - Follow-Up Consult  Pharmacy Consult for Warfarin/Lovenox Indication: mechanical MVR  Allergies  Allergen Reactions  . Zoloft [Sertraline] Hives, Itching and Rash    Patient Measurements: Height: 5\' 7"  (170.2 cm) Weight: 60.3 kg (133 lb) IBW/kg (Calculated) : 61.6  Vital Signs: Temp: 97.7 F (36.5 C) (08/26 0740) Temp Source: Oral (08/26 0740) BP: 108/70 (08/26 0740) Pulse Rate: 74 (08/26 0740)  Labs: Recent Labs    07/01/20 0612 07/02/20 0433 07/03/20 0352  HGB 13.6  --   --   HCT 42.9  --   --   PLT 188  --   --   LABPROT  --  29.1* 25.6*  INR  --  2.9* 2.4*  CREATININE 0.77  --   --     Estimated Creatinine Clearance: 58.7 mL/min (by C-G formula based on SCr of 0.77 mg/dL).   Medical History: Past Medical History:  Diagnosis Date  . Abnormality of gait 09/07/2016  . Allergy   . Diabetes mellitus without complication [facility])    Patient denies this - notes history of glucose intolerance  . GERD (gastroesophageal reflux disease)   . Hemiparesis and alteration of sensations as late effects of stroke ([place]) 09/07/2016  . History of pneumonia 1997  . Hypertension   . S/P MVR (mitral valve replacement)    Mechanical mitral valve replacement at age 1 (done in [place])  // echo 7/17: EF 55-60, normal wall motion, bileaflet mechanical mitral valve prosthesis functioning normally, mild LAE, mildly reduced RVSF, small pericardial effusion  . Stroke [facility]) 1997, 2013, 2015    Medications:  See electronic med rec  Assessment: 74 y.o. presents with dizziness, now with acute ischemic stroke Pt on warfarin PTA for mechanical MVR and h/o hemispheric strokes. INR 8/23 0000 was therapeutic (3.2). Admission INR pending. CBC ok on admission. Neurology agrees with continuing coumadin due benefits for heart valve outweigh due to low risk for hemorrhagic transformation with small stroke size.  Home dose: Warfarin 5mg  except for 7.5mg  Mon and  Fri  INR trended down to 2.4 this AM after getting her higher home dose. We will repeat it again today. Lovenox ordered for bridging until INR is therapeutic.   Goal of Therapy:  INR goal 3-3.5 Monitor platelets by anticoagulation protocol: Yes   Plan:  - Warfarin 7.5 mg x 1 - Daily PT/INR, CBC q72h - Bridge with lovenox 60mg  BID  Onnie Boer, PharmD, [place], AAHIVP, CPP Infectious Disease Pharmacist 07/03/2020 10:17 AM

## 2020-07-03 NOTE — Consult Note (Signed)
Consultation Note Date: 07/03/2020   Patient Name: [name]  DOB: 1946-05-02  MRN: [id number]  Age / Sex: 74 y.o., female  PCP: Tisovec, Adelfa Koh, MD Referring Physician: Kathlen Mody, MD  Reason for Consultation: Establishing goals of care  HPI/Patient Profile: 74 y.o. 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 Found to have small left frontal lobe infarct. PMT consulted to discuss GOC.  Clinical Assessment and Goals of Care: I have reviewed medical records including EPIC notes, labs and imaging, received report from RN, assessed the patient and then met with patient  to discuss diagnosis prognosis, GOC, EOL wishes, disposition and options.  I introduced Palliative Medicine as specialized medical care for people living with serious illness. It focuses on providing relief from the symptoms and stress of a serious illness. The goal is to improve quality of life for both the patient and the family.  We discussed a brief life review of the patient. She tells me about growing up in [place] and then living in [place] for >30 years. She moved to [place] to be closer to her son who lives in [place]. She worked as Midwife. She is married to [place] and they have a son and daughter. She also has grandchildren.   As far as functional and nutritional status, she tells me she has been doing very well. She does not need assistance at home. She only uses a cane when she is out of the home. No problems with appetite or cognition. She keeps close check on her INR - checks weekly.    We discussed patient's current illness and what it means in the larger context of patient's on-going co-morbidities.I attempted to elicit values and goals of care important to the patient.     Advance directives, concepts  specific to code status, artificial feeding and hydration, and rehospitalization were considered and discussed. We reviewed living will, HCPOA, and MOST. These documents were discussed in detail. She plans to discuss them with her husband prior to completion.   Discussed with patient the importance of continued conversation with family and the medical providers regarding overall plan of care and treatment options, ensuring decisions are within the context of the patients values and GOCs.    She expresses interest in inpatient rehab.  Questions and concerns were addressed. The family was encouraged to call with questions or concerns.   Primary Decision Maker PATIENT    SUMMARY OF RECOMMENDATIONS   Full code/full scope MOST introduced - patient to discuss with family She will call if additional questions arise  Code Status/Advance Care Planning:  Full code  Prognosis:   Unable to determine  Discharge Planning: hopeful for CIR      Primary Diagnoses: Present on Admission:  HTN (hypertension)  Hyperlipemia   I have reviewed the medical record, interviewed the patient and family, and examined the patient. The following aspects are pertinent.  Past Medical History:  Diagnosis Date   Abnormality of gait 09/07/2016   Allergy    Diabetes mellitus without complication [facility])    Patient denies this - notes history of glucose intolerance   GERD (gastroesophageal reflux disease)    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) 09/07/2016   History of pneumonia 1997   Hypertension    S/P MVR (mitral valve replacement)    Mechanical mitral valve replacement  at age 83 (done in [place])  // echo 7/17: EF 55-60, normal wall motion, bileaflet mechanical mitral valve prosthesis functioning normally, mild LAE, mildly reduced RVSF, small pericardial effusion   Stroke ([place]) 1997, 2013, 2015   Social History   Socioeconomic History   Marital  status: Married    Spouse name: Not on file   Number of children: 2   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   Highest education level: Not on file  Occupational History   Occupation: Retired  Tobacco Use   Smoking status: Never Smoker   Smokeless tobacco: Never Used  Scientific laboratory technician Use: Never used  Substance and Sexual Activity   Alcohol use: No    Alcohol/week: 0.0 standard drinks   Drug use: No   Sexual activity: Not on file    Comment: Married  Other Topics Concern   Not on file  Social History Narrative   Lives at home w/ her husband   Right-handed   Caffeine: none   Social Determinants of Health   Financial Resource Strain:    Difficulty of Paying Living Expenses: Not on file  Food Insecurity:    Worried About Charity fundraiser in the Last Year: Not on file   [place] in the Last Year: Not on file  Transportation Needs:    Lack of Transportation (Medical): Not on file   Lack of Transportation (Non-Medical): Not on file  Physical Activity:    Days of Exercise per Week: Not on file   Minutes of Exercise per Session: Not on file  Stress:    Feeling of Stress : Not on file  Social Connections:    Frequency of Communication with Friends and Family: Not on file   Frequency of Social Gatherings with Friends and Family: Not on file   Attends Religious Services: Not on file   Active Member of Clubs or Organizations: Not on file   Attends Archivist Meetings: Not on file   Marital Status: Not on file   Family History  Problem Relation Age of Onset   Cancer Mother        Bone   Heart disease Mother    Hyperlipidemia Mother    Hypertension Mother    Stroke Father    Hypertension Father    Heart attack Neg Hx    Scheduled Meds:  amLODipine  10 mg Oral QHS   aspirin EC  81 mg Oral Daily   atorvastatin  40 mg Oral QHS   docusate sodium  100 mg Oral QHS   pantoprazole  40 mg Oral Daily   warfarin   7.5 mg Oral ONCE-1600   Warfarin - Pharmacist Dosing Inpatient   Oral q1600   Continuous Infusions: PRN Meds:.acetaminophen **OR** acetaminophen (TYLENOL) oral liquid 160 mg/5 mL **OR** acetaminophen Allergies  Allergen Reactions   Zoloft [Sertraline] Hives, Itching and Rash   Review of Systems  Constitutional: Positive for activity change.  All other systems reviewed and are negative.   Physical Exam Constitutional:      General: She is not in acute distress. Pulmonary:     Effort: Pulmonary effort is normal.  Skin:    General: Skin is warm and dry.  Neurological:     Mental Status: She is alert and oriented to person, place, and time.  Psychiatric:        Mood and Affect: Mood normal.        Behavior: Behavior normal.  Vital Signs: BP 108/70 (BP Location: Right Arm)    Pulse 74    Temp 97.7 F (36.5 C) (Oral)    Resp 14    Ht _0  (1.702 m)    Wt 60.3 kg    SpO2 96%    BMI 20.83 kg/m  Pain Scale: 0-10   Pain Score: 0-No pain   SpO2: SpO2: 96 % O2 Device:SpO2: 96 % O2 Flow Rate: .   IO: Intake/output summary:   Intake/Output Summary (Last 24 hours) at 07/03/2020 1025 Last data filed at 07/03/2020 0741 Gross per 24 hour  Intake 360 ml  Output --  Net 360 ml    LBM: Last BM Date: 07/02/20 Baseline Weight: Weight: 60.3 kg Most recent weight: Weight: 60.3 kg     Palliative Assessment/Data: PPS 70%    Time Total: 50 minutes Greater than 50%  of this time was spent counseling and coordinating care related to the above assessment and plan.  Juel Burrow, DNP, AGNP-C Palliative Medicine Team [phone] Pager: [phone]

## 2020-07-04 ENCOUNTER — Other Ambulatory Visit: Payer: Self-pay | Admitting: Cardiovascular Disease

## 2020-07-04 ENCOUNTER — Inpatient Hospital Stay (HOSPITAL_COMMUNITY)
Admission: RE | Admit: 2020-07-04 | Discharge: 2020-07-16 | DRG: 057 | Disposition: A | Discharge status: Home / Self Care | Payer: Medicare Other | Source: Intra-hospital | Attending: Physical Medicine & Rehabilitation | Admitting: Physical Medicine & Rehabilitation

## 2020-07-04 ENCOUNTER — Other Ambulatory Visit: Payer: Self-pay

## 2020-07-04 ENCOUNTER — Encounter (HOSPITAL_COMMUNITY): Payer: Self-pay | Admitting: Physical Medicine & Rehabilitation

## 2020-07-04 DIAGNOSIS — Z823 Family history of stroke: Secondary | ICD-10-CM | POA: Diagnosis not present

## 2020-07-04 DIAGNOSIS — Z8249 Family history of ischemic heart disease and other diseases of the circulatory system: Secondary | ICD-10-CM | POA: Diagnosis not present

## 2020-07-04 DIAGNOSIS — R7303 Prediabetes: Secondary | ICD-10-CM | POA: Diagnosis present

## 2020-07-04 DIAGNOSIS — E785 Hyperlipidemia, unspecified: Secondary | ICD-10-CM | POA: Diagnosis present

## 2020-07-04 DIAGNOSIS — I639 Cerebral infarction, unspecified: Secondary | ICD-10-CM | POA: Diagnosis not present

## 2020-07-04 DIAGNOSIS — R2689 Other abnormalities of gait and mobility: Secondary | ICD-10-CM | POA: Diagnosis present

## 2020-07-04 DIAGNOSIS — I69398 Other sequelae of cerebral infarction: Secondary | ICD-10-CM | POA: Diagnosis not present

## 2020-07-04 DIAGNOSIS — Z83438 Family history of other disorder of lipoprotein metabolism and other lipidemia: Secondary | ICD-10-CM | POA: Diagnosis not present

## 2020-07-04 DIAGNOSIS — K219 Gastro-esophageal reflux disease without esophagitis: Secondary | ICD-10-CM | POA: Diagnosis present

## 2020-07-04 DIAGNOSIS — I69354 Hemiplegia and hemiparesis following cerebral infarction affecting left non-dominant side: Principal | ICD-10-CM

## 2020-07-04 DIAGNOSIS — Z7982 Long term (current) use of aspirin: Secondary | ICD-10-CM | POA: Diagnosis not present

## 2020-07-04 DIAGNOSIS — Z7901 Long term (current) use of anticoagulants: Secondary | ICD-10-CM

## 2020-07-04 DIAGNOSIS — Z79899 Other long term (current) drug therapy: Secondary | ICD-10-CM

## 2020-07-04 DIAGNOSIS — Z952 Presence of prosthetic heart valve: Secondary | ICD-10-CM

## 2020-07-04 DIAGNOSIS — Z888 Allergy status to other drugs, medicaments and biological substances status: Secondary | ICD-10-CM

## 2020-07-04 DIAGNOSIS — I10 Essential (primary) hypertension: Secondary | ICD-10-CM | POA: Diagnosis present

## 2020-07-04 LAB — PROTIME-INR
INR: 3.1 — ABNORMAL HIGH (ref 0.8–1.2)
Prothrombin Time: 30.6 seconds — ABNORMAL HIGH (ref 11.4–15.2)

## 2020-07-04 MED ORDER — ACETAMINOPHEN 650 MG RE SUPP: 650.0000 mg | RECTAL | Status: DC | PRN

## 2020-07-04 MED ORDER — PANTOPRAZOLE SODIUM 40 MG PO TBEC
40.0000 mg | DELAYED_RELEASE_TABLET | Freq: Every day | ORAL | Status: DC
  Administered 2020-07-05 – 2020-07-16 (×12): 40 mg via ORAL
  Filled 2020-07-04 (×12): qty 1

## 2020-07-04 MED ORDER — ACETAMINOPHEN 160 MG/5ML PO SOLN: 650.0000 mg | ORAL | Status: DC | PRN

## 2020-07-04 MED ORDER — ATORVASTATIN CALCIUM 40 MG PO TABS
40.0000 mg | ORAL_TABLET | Freq: Every day | ORAL | 2 refills | Status: DC
Start: 2020-07-04 — End: 2020-07-15

## 2020-07-04 MED ORDER — PANTOPRAZOLE SODIUM 40 MG PO TBEC
40.0000 mg | DELAYED_RELEASE_TABLET | Freq: Every day | ORAL | 1 refills | Status: DC
Start: 2020-07-04 — End: 2020-07-15

## 2020-07-04 MED ORDER — ASPIRIN EC 81 MG PO TBEC
81.0000 mg | DELAYED_RELEASE_TABLET | Freq: Every day | ORAL | Status: DC
  Administered 2020-07-05 – 2020-07-16 (×12): 81 mg via ORAL
  Filled 2020-07-04 (×12): qty 1

## 2020-07-04 MED ORDER — WARFARIN - PHARMACIST DOSING INPATIENT: Freq: Every day | Status: DC

## 2020-07-04 MED ORDER — ACETAMINOPHEN 325 MG PO TABS
650.0000 mg | ORAL_TABLET | ORAL | Status: DC | PRN
  Administered 2020-07-05 – 2020-07-06 (×2): 650 mg via ORAL
  Filled 2020-07-04 (×5): qty 2

## 2020-07-04 MED ORDER — WARFARIN SODIUM 5 MG PO TABS
5.0000 mg | ORAL_TABLET | Freq: Once | ORAL | Status: AC
  Administered 2020-07-04: 5 mg via ORAL
  Filled 2020-07-04: qty 1

## 2020-07-04 MED ORDER — WARFARIN SODIUM 5 MG PO TABS: 5.0000 mg | ORAL_TABLET | Freq: Once | ORAL | Status: DC

## 2020-07-04 MED ORDER — AMLODIPINE BESYLATE 10 MG PO TABS
10.0000 mg | ORAL_TABLET | Freq: Every day | ORAL | Status: DC
  Administered 2020-07-04 – 2020-07-06 (×3): 10 mg via ORAL
  Filled 2020-07-04 (×3): qty 1

## 2020-07-04 MED ORDER — DOCUSATE SODIUM 100 MG PO CAPS
100.0000 mg | ORAL_CAPSULE | Freq: Every day | ORAL | Status: DC
  Administered 2020-07-04 – 2020-07-15 (×12): 100 mg via ORAL
  Filled 2020-07-04 (×12): qty 1

## 2020-07-04 MED ORDER — ATORVASTATIN CALCIUM 40 MG PO TABS
40.0000 mg | ORAL_TABLET | Freq: Every day | ORAL | Status: DC
  Administered 2020-07-04 – 2020-07-15 (×12): 40 mg via ORAL
  Filled 2020-07-04 (×12): qty 1

## 2020-07-04 NOTE — Progress Notes (Signed)
PMR Admission Coordinator Pre-Admission Assessment   Patient: [name] is an 74 y.o., female MRN: [id number] DOB: 1946-02-20 Height: 5\' 7"  (170.2 cm) Weight: 60.3 kg   Insurance Information HMO:     PPO:      PCP:      IPA:      80/20:      OTHER:  PRIMARY: Medicare A and B      Policy#: [id number]      Subscriber: pt CM Name:       Phone#:      Fax#:  Pre-Cert#: verified Civil engineer, contracting:  Benefits:  Phone #:      Name:  Eff. Date: A and B 05/09/11     Deduct: $1484      Out of Pocket Max: n/a      Life Max: n/a CIR: 100%      SNF: 20 full days Outpatient: 80%     Co-Pay: 20% Home Health: 100%      Co-Pay:  DME: 80%     Co-Pay: 20% Providers:  SECONDARYLynda Rainwater      Policy#: [id number]     Phone#: [phone]   Financial Counselor:       Phone#:    The "Data Collection Information Summary" for patients in Inpatient Rehabilitation Facilities with attached "Privacy Act [place] Records" was provided and verbally reviewed with: Patient and Family   Emergency Contact Information         Contact Information     Name Relation Home Work Mobile    [place] Spouse [phone]   [phone]    Simeon Craft Daughter     [phone]         Current Medical History  Patient Admitting Diagnosis: CVA    History of Present Illness: [name] is a 74 year old right-handed female with history of prediabetes, history of craniotomy, hypertension, mitral valve replacement maintained on chronic Coumadin, 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  Presented 07/01/2020 with dizziness and weakness x3 days.  MRI showed single small cortical subcortical ischemic nonhemorrhagic infarct involving the parasagittal posterior left frontal lobe.  Underlying moderate to advanced age-related cerebral atrophy with chronic microvascular ischemic disease with multiple remote lacunar infarcts involving the  left thalamus and bilateral cerebellar hemispheres.  CT angiogram of head and neck with no emergent findings.  Patient did not receive TPA.  Admission chemistries INR 2.9, hemoglobin A1c 6.1, chemistries unremarkable except glucose 139.  Echocardiogram with ejection fraction of 60% no wall motion abnormalities.  Presently maintained on low-dose aspirin as well as ongoing chronic Coumadin with a goal INR of 3.0-3.5.  Tolerating a regular diet.  Therapy evaluations completed and patient was recommended for a comprehensive rehab program.   Complete NIHSS TOTAL: 0   Patient's medical record from [facility] has been reviewed by the rehabilitation admission coordinator and physician.   Past Medical History      Past Medical History:  Diagnosis Date  . Abnormality of gait 09/07/2016  . Allergy    . Diabetes mellitus without complication [facility])      Patient denies this - notes history of glucose intolerance  . GERD (gastroesophageal reflux disease)    . Hemiparesis and alteration of sensations as late effects of stroke ([place]) 09/07/2016  . History of pneumonia 1997  . Hypertension    . S/P MVR (mitral valve replacement)      Mechanical mitral valve replacement  at age 67 (done in [place])  // echo 7/17: EF 55-60, normal wall motion, bileaflet mechanical mitral valve prosthesis functioning normally, mild LAE, mildly reduced RVSF, small pericardial effusion  . Stroke [facility]) 1997, 2013, 2015      Family History   family history includes Cancer in her mother; Heart disease in her mother; Hyperlipidemia in her mother; Hypertension in her father and mother; Stroke in her father.   Prior Rehab/Hospitalizations Has the patient had prior rehab or hospitalizations prior to admission? Yes   Has the patient had major surgery during 100 days prior to admission? No               Current Medications   Current Facility-Administered Medications:  .  acetaminophen (TYLENOL) tablet 650 mg, 650 mg, Oral,  Q4H PRN **OR** acetaminophen (TYLENOL) 160 MG/5ML solution 650 mg, 650 mg, Per Tube, Q4H PRN **OR** acetaminophen (TYLENOL) suppository 650 mg, 650 mg, Rectal, Q4H PRN, Chotiner, Yevonne Aline, MD .  amLODipine (NORVASC) tablet 10 mg, 10 mg, Oral, QHS, Chotiner, Yevonne Aline, MD, 10 mg at 07/03/20 2057 .  aspirin EC tablet 81 mg, 81 mg, Oral, Daily, Rosalin Hawking, MD, 81 mg at 07/04/20 1103 .  atorvastatin (LIPITOR) tablet 40 mg, 40 mg, Oral, QHS, Rosalin Hawking, MD, 40 mg at 07/03/20 2057 .  docusate sodium (COLACE) capsule 100 mg, 100 mg, Oral, QHS, Chotiner, Yevonne Aline, MD, 100 mg at 07/03/20 2057 .  pantoprazole (PROTONIX) EC tablet 40 mg, 40 mg, Oral, Daily, Chotiner, Yevonne Aline, MD, 40 mg at 07/04/20 1103 .  warfarin (COUMADIN) tablet 5 mg, 5 mg, Oral, ONCE-1600, Hosie Poisson, MD .  Warfarin - Pharmacist Dosing Inpatient, , Oral, q1600, Rosalin Hawking, MD, Given at 07/02/20 1756   Patients Current Diet:  Diet Order                      Diet Heart Room service appropriate? No; Fluid consistency: Thin  Diet effective now                      Precautions / Restrictions Precautions Precautions: Fall Precaution Comments: mod fall Other Brace: L AFO Restrictions Weight Bearing Restrictions: No    Has the patient had 2 or more falls or a fall with injury in the past year? No   Prior Activity Level Limited Community (1-2x/wk): was using a SPC only for mobility outside of the home (no DME for household), was mobilizing and completing ADLs with mod I>indep.     Prior Functional Level Self Care: Did the patient need help bathing, dressing, using the toilet or eating? Independent   Indoor Mobility: Did the patient need assistance with walking from room to room (with or without device)? Independent   Stairs: Did the patient need assistance with internal or external stairs (with or without device)? Independent   Functional Cognition: Did the patient need help planning regular tasks such as shopping  or remembering to take medications? [place] / [place] Devices/Equipment: Cane (specify quad or straight) Home Equipment: Walker - 2 wheels, Cane - single point   Prior Device Use: Indicate devices/aids used by the patient prior to current illness, exacerbation or injury? single point cane for community mobility   Current Functional Level Cognition   Arousal/Alertness: Awake/alert Overall Cognitive Status: Within Functional Limits for tasks assessed Orientation Level: Oriented X4 Memory: Appears intact Awareness: Appears intact Problem Solving: Appears intact Safety/Judgment: Appears intact  Extremity Assessment (includes Sensation/Coordination)   Upper Extremity Assessment: LUE deficits/detail LUE Deficits / Details: hx of prior CVA with L sided spastic hemiparesis; able to use functionally with mild discoordination  LUE Sensation: WNL LUE Coordination: decreased fine motor, decreased gross motor  Lower Extremity Assessment: RLE deficits/detail, LLE deficits/detail RLE Deficits / Details: No overt change in R LE function even with small L frontal infarct. RLE Sensation: WNL LLE Deficits / Details: mild weakness and coordination issues,  wears AFO at all times. LLE Sensation: WNL LLE Coordination: decreased fine motor (coordination is "spastic" in nature.)     ADLs   Overall ADL's : Needs assistance/impaired Grooming: Min guard, Standing Upper Body Bathing: Minimal assistance, Sitting Lower Body Bathing: Minimal assistance, Sit to/from stand Upper Body Dressing : Minimal assistance, Sitting Lower Body Dressing: Minimal assistance, Sit to/from stand Lower Body Dressing Details (indicate cue type and reason): able to don shoes with min assist, min assist sit to stand  Toilet Transfer: Minimal assistance, Ambulation, Regular Toilet, RW Toileting- Clothing Manipulation and Hygiene: Minimal assistance Toileting - Clothing Manipulation  Details (indicate cue type and reason): clothing mgmt  Functional mobility during ADLs: Minimal assistance, Rolling walker General ADL Comments: pt limited by L sided weakness, impaired balance and decrased activity tolerance     Mobility   Overal bed mobility: Needs Assistance Bed Mobility: Supine to Sit Supine to sit: Supervision General bed mobility comments: Patient received up in bathroom with NT upon arrival     Transfers   Overall transfer level: Needs assistance Equipment used: Rolling walker (2 wheeled) Transfers: Sit to/from Stand Sit to Stand: Min assist General transfer comment: min assist to power up and steady from EOB, increased time and effort with cueing for hand placement and safety      Ambulation / Gait / Stairs / Wheelchair Mobility   Ambulation/Gait Ambulation/Gait assistance: Counsellor (Feet): 150 Feet Assistive device: Rolling walker (2 wheeled), 1 person hand held assist Gait Pattern/deviations: Step-through pattern, Decreased step length - left, Decreased step length - right, Decreased stance time - left, Decreased stride length, Shuffle, Trunk flexed General Gait Details: Pt's gait is spastic and paretic in nature with and IR/ER waggle with each step.  No toe off and flat foot contact. She is more steady with the RW, but her left foot occasionally hits the RW.  Ambulated the last 20 feet with hand held assist and she is less stable and unsure of herself. Gait velocity: decr Gait velocity interpretation: <1.8 ft/sec, indicate of risk for recurrent falls     Posture / Balance Dynamic Sitting Balance Sitting balance - Comments: able to reach outside BOS without lob in sitting Balance Overall balance assessment: Needs assistance Sitting-balance support: No upper extremity supported, Feet supported Sitting balance-Leahy Scale: Good Sitting balance - Comments: able to reach outside BOS without lob in sitting Standing balance support: Bilateral  upper extremity supported, During functional activity Standing balance-Leahy Scale: Fair Standing balance comment: Reliant on B UE support with dynamic standing. Unsure of herself and less steady with single UE support.     Special needs/care consideration n/a    Previous Home Environment (from acute therapy documentation) Living Arrangements: Spouse/significant other  Lives With: Spouse Available Help at Discharge: Family, Industrial/product designer, Available 24 hours/day Type of Home: House (town house ) Home Layout: Able to live on main level with bedroom/bathroom, Two level Home Access: Level entry Bathroom Shower/Tub: Walk-in shower (pt sponge bathes only ) Biochemist, clinical: [place]:  No   Discharge Living Setting Plans for Discharge Living Setting: Patient's home Type of Home at Discharge: House (town home) Discharge Home Layout: Able to live on main level with bedroom/bathroom Discharge Home Access: Level entry Discharge Bathroom Shower/Tub: Walk-in shower (pt sponge bathes) Discharge Bathroom Toilet: Standard Discharge Bathroom Accessibility: Yes How Accessible: Accessible via walker Does the patient have any problems obtaining your medications?: No   Social/Family/Support Systems Anticipated Caregiver: Kesleigh Morson Anticipated Caregiver's Contact Information: [phone] Ability/Limitations of Caregiver: n/a Caregiver Availability: 24/7 Discharge Plan Discussed with Primary Caregiver: Yes Is Caregiver In Agreement with Plan?: Yes Does Caregiver/Family have Issues with Lodging/Transportation while Pt is in Rehab?: No   Goals Patient/Family Goal for Rehab: PT/OT mod I, SLP n/a Expected length of stay: 5-7 days Additional Information: prior admit to Dr. Letta Pate team in Oct 2020 Pt/Family Agrees to Admission and willing to participate: Yes Program Orientation Provided & Reviewed with Pt/Caregiver Including Roles  & Responsibilities: Yes   Decrease burden of Care  through IP rehab admission: n/a   Possible need for SNF placement upon discharge: Not anticipated   Patient Condition: I have reviewed medical records from [facility], spoken with CSW, and patient and spouse. I met with patient at the bedside for inpatient rehabilitation assessment.  Patient will benefit from ongoing PT and OT, can actively participate in 3 hours of therapy a day 5 days of the week, and can make measurable gains during the admission.  Patient will also benefit from the coordinated team approach during an Inpatient Acute Rehabilitation admission.  The patient will receive intensive therapy as well as Rehabilitation physician, nursing, social worker, and care management interventions.  Due to safety, disease management and patient education the patient requires 24 hour a day rehabilitation nursing.  The patient is currently min with mobility and basic ADLs.  Discharge setting and therapy post discharge at home with outpatient is anticipated.  Patient has agreed to participate in the Acute Inpatient Rehabilitation Program and will admit today.   Preadmission Screen Completed By:  Bonnee Quin, DPT 07/04/2020 12:04 PM ______________________________________________________________________   Discussed status with Dr. Ranell Patrick on 07/04/20 at 12:08 PM  and received approval for admission today.   Admission Coordinator:  Michel Santee, PT, DPT time 12:08 PM Sudie Grumbling 07/04/20     Assessment/Plan: Diagnosis: Left frontal lobe ischemic infarct 1. Does the need for close, 24 hr/day Medical supervision in concert with the patient's rehab needs make it unreasonable for this patient to be served in a less intensive setting? Yes 2. Co-Morbidities requiring supervision/potential complications: HTN, HLD, Left sided hemiparesis due to previous stroke, h/o mitral valve replacement with mechanical valve, vertigo, DM without complication 3. Due to bladder management, bowel management, safety,  skin/wound care, disease management, medication administration, pain management and patient education, does the patient require 24 hr/day rehab nursing? Yes 4. Does the patient require coordinated care of a physician, rehab nurse, PT, OT to address physical and functional deficits in the context of the above medical diagnosis(es)? Yes Addressing deficits in the following areas: balance, endurance, locomotion, strength, transferring, bowel/bladder control, bathing, dressing, feeding, grooming, toileting and psychosocial support 5. Can the patient actively participate in an intensive therapy program of at least 3 hrs of therapy 5 days a week? Yes 6. The potential for patient to make measurable gains while on inpatient rehab is excellent 7. Anticipated functional outcomes upon discharge from inpatient rehab: modified independent PT, modified independent OT, independent SLP 8. Estimated rehab length of  stay to reach the above functional goals is: 6-8 days 9. Anticipated discharge destination: Home 10. Overall Rehab/Functional Prognosis: excellent     MD Signature: Leeroy Cha, MD

## 2020-07-04 NOTE — H&P (Signed)
Physical Medicine and Rehabilitation Admission H&P    Chief Complaint  Patient presents with  . Dizziness  . 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  Per chart review lives with spouse.  Two-level home bed and bath main level.  Independent with light ADLs.  She uses a cane for ambulation as well as left AFO.  She has a son in [place].  Presented 07/01/2020 with dizziness and weakness x3 days.  MRI showed single small cortical subcortical ischemic nonhemorrhagic infarct involving the parasagittal posterior left frontal lobe.  Underlying moderate to advanced age-related cerebral atrophy with chronic microvascular ischemic disease with multiple remote lacunar infarcts involving the left thalamus and bilateral cerebellar hemispheres.  CT angiogram of head and neck with no emergent findings.  Patient did not receive TPA.  Admission chemistries INR 2.9, hemoglobin A1c 6.1, chemistries unremarkable except glucose 139.  Echocardiogram with ejection fraction of 60% no wall motion abnormalities.  Presently maintained on low-dose aspirin as well as ongoing chronic Coumadin with a goal INR of 3.0-3.5.  Tolerating a regular diet.  Therapy evaluations completed and patient was admitted for a comprehensive rehab program.  Review of Systems  Constitutional: Negative for chills and fever.  HENT: Negative for hearing loss.   Eyes: Negative for blurred vision and double vision.  Respiratory: Negative for shortness of breath.   Cardiovascular: Negative for chest pain, palpitations and leg swelling.  Gastrointestinal: Positive for constipation. Negative for heartburn, nausea and vomiting.       GERD  Genitourinary: Negative for dysuria, flank pain and  hematuria.  Musculoskeletal: Positive for joint pain and myalgias.  Skin: Negative for rash.  Neurological: Positive for dizziness, sensory change and weakness.  All other systems reviewed and are negative.  Past Medical History:  Diagnosis Date  . Abnormality of gait 09/07/2016  . Allergy   . Diabetes mellitus without complication [facility])    Patient denies this - notes history of glucose intolerance  . GERD (gastroesophageal reflux disease)   . Hemiparesis and alteration of sensations as late effects of stroke ([place]) 09/07/2016  . History of pneumonia 1997  . Hypertension   . S/P MVR (mitral valve replacement)    Mechanical mitral valve replacement at age 11 (done in [place])  // echo 7/17: EF 55-60, normal wall motion, bileaflet mechanical mitral valve prosthesis functioning normally, mild LAE, mildly reduced RVSF, small pericardial effusion  . Stroke [facility]) 1997, 2013, 2015   Past Surgical History:  Procedure Laterality Date  . ABDOMINAL HYSTERECTOMY    . BRAIN SURGERY    . BUNIONECTOMY  1993  . CARDIAC VALVE REPLACEMENT  1997  . TOE SURGERY  1996   Family History  Problem Relation Age of Onset  . Cancer Mother        Bone  . Heart disease Mother   . Hyperlipidemia Mother   . Hypertension Mother   . Stroke Father   . Hypertension Father   . Heart attack Neg Hx    Social History:  reports that she has never smoked. She has never used smokeless tobacco. She reports that she does not drink alcohol and does not use drugs. Allergies:  Allergies  Allergen Reactions  . Zoloft [Sertraline] Hives, Itching and Rash   Medications Prior to Admission  Medication Sig Dispense  Refill  . acetaminophen (TYLENOL) 325 MG tablet Take 2 tablets (650 mg total) by mouth every 4 (four) hours as needed for mild pain (or temp > 37.5 C (99.5 F)). (Patient taking differently: Take 650 mg by mouth daily. )    . amLODipine (NORVASC) 10 MG tablet Take 1 tablet (10 mg total) by mouth daily. (Patient  taking differently: Take 10 mg by mouth at bedtime. ) 30 tablet 0  . amoxicillin (AMOXIL) 500 MG capsule Take 2,000 mg by mouth as needed. 1 hour before appointment     . aspirin EC 81 MG tablet Take 81 mg by mouth daily.     Marland Kitchen atorvastatin (LIPITOR) 20 MG tablet Take 1 tablet (20 mg total) by mouth daily. (Patient taking differently: Take 20 mg by mouth at bedtime. ) 30 tablet 0  . calcium carbonate 1250 MG capsule Take 1,200 mg by mouth at bedtime.     . cholecalciferol (VITAMIN D) 1000 UNITS tablet Take 2,000 Units daily by mouth.     . docusate sodium (COLACE) 100 MG capsule Take 100 mg at bedtime by mouth.     . Menthol, Topical Analgesic, (BENGAY EX) Apply 1 application topically daily. All painful areas    . omeprazole (PRILOSEC) 20 MG capsule Take 20 mg by mouth at bedtime.     Marland Kitchen Propylene Glycol-Glycerin (SOOTHE) 0.6-0.6 % SOLN Place 1 drop into both eyes 2 (two) times daily as needed (dry eyes).    . warfarin (COUMADIN) 5 MG tablet TAKE BY MOUTH AS DIRECTED BY THE COUMADIN CLINIC FOR 30 DAYS. (Patient taking differently: Take 5-7.5 mg by mouth See admin instructions. 7.5mg  on MON and FRI and 5mg  on all other days.) 45 tablet 3    Drug Regimen Review  Drug regimen was reviewed and remains appropriate with no significant issues identified  Home: Home Living Family/patient expects to be discharged to:: Private residence Living Arrangements: Spouse/significant other Available Help at Discharge: Family, Industrial/product designer, Available 24 hours/day Type of Home: House (town house ) Home Access: Level entry Home Layout: Able to live on main level with bedroom/bathroom, Two level Bathroom Shower/Tub: Walk-in shower (pt sponge bathes only ) Biochemist, clinical: [place]: Environmental consultant - 2 wheels, Radio producer - single point  Lives With: Spouse   Functional History: Prior Function Level of Independence: Needs assistance Gait / Transfers Assistance Needed: uses cane in commuinty, nothing at home; L  AFO 24/7 ADL's / Homemaking Assistance Needed: sponge bathes, independent ADLS; light IADLs   Functional Status:  Mobility: Bed Mobility Overal bed mobility: Needs Assistance Bed Mobility: Supine to Sit Supine to sit: Supervision General bed mobility comments: Up with OT on arrival Transfers Overall transfer level: Needs assistance Equipment used: Rolling walker (2 wheeled) Transfers: Sit to/from Stand Sit to Stand: Min assist General transfer comment: min assist to power up and steady from EOB, increased time and effort with cueing for hand placement and safety  Ambulation/Gait Ambulation/Gait assistance: Min assist Gait Distance (Feet): 80 Feet (total with RW and without RW, but use of the rail) Assistive device: Rolling walker (2 wheeled) (rail) Gait Pattern/deviations: Step-through pattern, Decreased step length - left, Decreased step length - right, Decreased stance time - left, Decreased stride length General Gait Details: Pt's gait is spastic and paretic in nature with and IR/ER waggle with each step.  No toe off and flat foot contact. She is likely more steady with the RW, but her left foot occasionally hits the RW.  With the rail she  is still fairly stable, if not slow and paretic Gait velocity: slower Gait velocity interpretation: <1.8 ft/sec, indicate of risk for recurrent falls    ADL: ADL Overall ADL's : Needs assistance/impaired Grooming: Min guard, Standing Upper Body Bathing: Minimal assistance, Sitting Lower Body Bathing: Minimal assistance, Sit to/from stand Upper Body Dressing : Minimal assistance, Sitting Lower Body Dressing: Minimal assistance, Sit to/from stand Lower Body Dressing Details (indicate cue type and reason): able to don shoes with min assist, min assist sit to stand  Toilet Transfer: Minimal assistance, Ambulation, Regular Toilet, RW Toileting- Clothing Manipulation and Hygiene: Minimal assistance Toileting - Clothing Manipulation Details  (indicate cue type and reason): clothing mgmt  Functional mobility during ADLs: Minimal assistance, Rolling walker General ADL Comments: pt limited by L sided weakness, impaired balance and decrased activity tolerance  Cognition: Cognition Overall Cognitive Status: Within Functional Limits for tasks assessed Arousal/Alertness: Awake/alert Orientation Level: Oriented X4 Memory: Appears intact Awareness: Appears intact Problem Solving: Appears intact Safety/Judgment: Appears intact Cognition Arousal/Alertness: Awake/alert Behavior During Therapy: WFL for tasks assessed/performed Overall Cognitive Status: Within Functional Limits for tasks assessed  Physical Exam: Blood pressure 121/88, pulse 81, temperature 97.9 F (36.6 C), temperature source Oral, resp. rate 17, height 5\' 7"  (1.702 m), weight 60.3 kg, SpO2 98 %. General: Alert and oriented x 3, No apparent distress HEENT: Head is normocephalic, atraumatic, PERRLA, EOMI, sclera anicteric, oral mucosa pink and moist, dentition intact, ext ear canals clear,  Neck: Supple without JVD or lymphadenopathy Heart: Reg rate and rhythm. No murmurs rubs or gallops Chest: CTA bilaterally without wheezes, rales, or rhonchi; no distress Abdomen: Soft, non-tender, non-distended, bowel sounds positive. Extremities: No clubbing, cyanosis, or edema. Pulses are 2+ Skin: Clean and intact without signs of breakdown; IV right arm Neuro:  Patient is alert in no acute distress.  Makes good eye contact with examiner and follows commands.  Fair awareness of deficits. 5/5 strength on right side and 4/5 in left leg.   Psych: Pt's affect is appropriate. Pt is cooperative   Results for orders placed or performed during the hospital encounter of 07/01/20 (from the past 48 hour(s))  Protime-INR     Status: Abnormal   Collection Time: 07/03/20  3:52 AM  Result Value Ref Range   Prothrombin Time 25.6 (H) 11.4 - 15.2 seconds   INR 2.4 (H) 0.8 - 1.2    Comment:  (NOTE) INR goal varies based on device and disease states. Performed at [facility], [place] [address], [place], [place] [address]   Protime-INR     Status: Abnormal   Collection Time: 07/04/20  3:43 AM  Result Value Ref Range   Prothrombin Time 30.6 (H) 11.4 - 15.2 seconds   INR 3.1 (H) 0.8 - 1.2    Comment: (NOTE) INR goal varies based on device and disease states. Performed at [facility], [place] [address] [place], [place] [address]    ECHOCARDIOGRAM COMPLETE  Result Date: 07/02/2020    ECHOCARDIOGRAM REPORT   Patient Name:   JEORGIA HELMING Date of Exam: 07/02/2020 Medical Rec #:  [id number]      Height:       67.0 in Accession #:    [id number]     Weight:       133.0 lb Date of Birth:  12/08/1945      BSA:          1.700 m Patient Age:    44 years  BP:           123/74 mmHg Patient Gender: F              HR:           76 bpm. Exam Location:  Inpatient Procedure: 2D Echo, Cardiac Doppler and Color Doppler Indications:    Stroke  History:        Patient has prior history of Echocardiogram examinations, most                 recent 09/04/2019. Risk Factors:Diabetes, Hypertension and                 Dyslipidemia. CVA. H/O mechanical mitral valve replacement.                  Mitral Valve: mechanical valve valve is present in the mitral                 position.  Sonographer:    Clayton Lefort RDCS (AE) Referring Phys: [id number] [place]  1. Left ventricular ejection fraction, by estimation, is 55 to 60%. The left ventricle has normal function. The left ventricle has no regional wall motion abnormalities. There is mild concentric left ventricular hypertrophy. Left ventricular diastolic function could not be evaluated.  2. Right ventricular systolic function is normal. The right ventricular size is normal. There is normal pulmonary artery systolic pressure. The estimated right ventricular systolic pressure is 62.2 mmHg.  3. Right atrial size was mildly dilated.  4.  Mechanical mitral valve replacement. Normal gradient. EOA 1.84 cm2, DI 1.7, E max 1.1 m/s, PHT 99 msec. The mitral valve has been repaired/replaced. No evidence of mitral valve regurgitation. The mean mitral valve gradient is 3.0 mmHg with average heart rate of 76 bpm. There is a mechanical valve present in the mitral position. Echo findings are consistent with normal structure and function of the mitral valve prosthesis.  5. The aortic valve is tricuspid. Aortic valve regurgitation is not visualized. Mild aortic valve sclerosis is present, with no evidence of aortic valve stenosis.  6. The inferior vena cava is normal in size with greater than 50% respiratory variability, suggesting right atrial pressure of 3 mmHg. Comparison(s): No significant change from prior study. FINDINGS  Left Ventricle: Left ventricular ejection fraction, by estimation, is 55 to 60%. The left ventricle has normal function. The left ventricle has no regional wall motion abnormalities. The left ventricular internal cavity size was normal in size. There is  mild concentric left ventricular hypertrophy. Abnormal (paradoxical) septal motion consistent with post-operative status. Left ventricular diastolic function could not be evaluated due to mitral valve replacement. Left ventricular diastolic function could not be evaluated. Right Ventricle: The right ventricular size is normal. No increase in right ventricular wall thickness. Right ventricular systolic function is normal. There is normal pulmonary artery systolic pressure. The tricuspid regurgitant velocity is 2.43 m/s, and  with an assumed right atrial pressure of 3 mmHg, the estimated right ventricular systolic pressure is 29.7 mmHg. Left Atrium: Left atrial size was not well visualized. Right Atrium: Right atrial size was mildly dilated. Pericardium: A small pericardial effusion is present. The pericardial effusion is circumferential. Mitral Valve: Mechanical mitral valve replacement.  Normal gradient. EOA 1.84 cm2, DI 1.7, E max 1.1 m/s, PHT 99 msec. The mitral valve has been repaired/replaced. No evidence of mitral valve regurgitation. There is a mechanical valve present in the mitral position. Echo findings are consistent with normal structure and  function of the mitral valve prosthesis. MV peak gradient, 5.5 mmHg. The mean mitral valve gradient is 3.0 mmHg with average heart rate of 76 bpm. Tricuspid Valve: The tricuspid valve is grossly normal. Tricuspid valve regurgitation is mild . No evidence of tricuspid stenosis. Aortic Valve: The aortic valve is tricuspid. . There is mild thickening and mild calcification of the aortic valve. Aortic valve regurgitation is not visualized. Mild aortic valve sclerosis is present, with no evidence of aortic valve stenosis. There is mild thickening of the aortic valve. There is mild calcification of the aortic valve. Aortic valve mean gradient measures 2.0 mmHg. Aortic valve peak gradient measures 4.2 mmHg. Aortic valve area, by VTI measures 2.26 cm. Pulmonic Valve: The pulmonic valve was grossly normal. Pulmonic valve regurgitation is trivial. No evidence of pulmonic stenosis. Aorta: The aortic root and ascending aorta are structurally normal, with no evidence of dilitation. Venous: The inferior vena cava is normal in size with greater than 50% respiratory variability, suggesting right atrial pressure of 3 mmHg. IAS/Shunts: The atrial septum is grossly normal.  LEFT VENTRICLE PLAX 2D LVIDd:         3.50 cm LVIDs:         2.30 cm LV PW:         1.40 cm LV IVS:        1.20 cm LVOT diam:     2.00 cm LV SV:         46 LV SV Index:   27 LVOT Area:     3.14 cm  RIGHT VENTRICLE             IVC RV Basal diam:  3.30 cm     IVC diam: 1.00 cm RV S prime:     10.70 cm/s TAPSE (M-mode): 2.2 cm LEFT ATRIUM             Index       RIGHT ATRIUM           Index LA diam:        2.80 cm 1.65 cm/m  RA Area:     21.30 cm LA Vol (A2C):   63.7 ml 37.46 ml/m RA Volume:    65.30 ml  38.40 ml/m LA Vol (A4C):   48.7 ml 28.64 ml/m LA Biplane Vol: 56.3 ml 33.11 ml/m  AORTIC VALVE AV Area (Vmax):    2.21 cm AV Area (Vmean):   2.05 cm AV Area (VTI):     2.26 cm AV Vmax:           102.00 cm/s AV Vmean:          74.800 cm/s AV VTI:            0.206 m AV Peak Grad:      4.2 mmHg AV Mean Grad:      2.0 mmHg LVOT Vmax:         71.80 cm/s LVOT Vmean:        48.800 cm/s LVOT VTI:          0.148 m LVOT/AV VTI ratio: 0.72  AORTA Ao Root diam: 3.80 cm Ao Asc diam:  3.40 cm MITRAL VALVE            TRICUSPID VALVE MV Area VTI:  1.84 cm  TR Peak grad:   23.6 mmHg MV Peak grad: 5.5 mmHg  TR Vmax:        243.00 cm/s MV Mean grad: 3.0 mmHg MV Vmax:  1.17 m/s  SHUNTS MV Vmean:     83.5 cm/s Systemic VTI:  0.15 m MV VTI:       0.25 m    Systemic Diam: 2.00 cm Eleonore Chiquito MD Electronically signed by Eleonore Chiquito MD Signature Date/Time: 07/02/2020/12:55:23 PM    Final        Medical Problem List and Plan: 1.  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  Antithrombotics: -DVT/anticoagulation: Chronic Coumadin for history of MVR.  Goal INR 3.0-3.5. INR is 3.1 on 8/27.   -antiplatelet therapy: Aspirin 81 mg daily 3. Pain Management: Tylenol as needed. Denies pain.  4. Mood: Advised emotional support  -antipsychotic agents: N/A 5. Neuropsych: This patient is capable of making decisions on her own behalf. 6. Skin/Wound Care: Routine skin checks. May remove IV 8/27. 7. Fluids/Electrolytes/Nutrition: Routine in and outs with follow-up chemistries 8.  Hypertension.  Norvasc 10 mg daily.  Monitor with increased mobility. Well controlled. 9.  Hyperlipidemia.  Lipitor 10.  History of mitral valve  replacement.  Continue chronic Coumadin.  No chest pain or shortness of breath 11.  Prediabetes.  Hemoglobin A1c 6.1.  Blood sugar checks discontinued 12.  GERD.  Protonix  Lavon Paganini Angiulli, PA-C 07/04/2020   I have personally performed a face to face diagnostic evaluation, including, but not limited to relevant history and physical exam findings, of this patient and developed relevant assessment and plan.  Additionally, I have reviewed and concur with the physician assistant's documentation above.  Leeroy Cha, MD

## 2020-07-04 NOTE — Progress Notes (Signed)
Pt arrived to unit, pt is alert and oriented, able to make needs known, pt oriented to unit and therapy, all needs met, call bell in reach, SR up x 2 and bed alarm on and functioning.

## 2020-07-04 NOTE — Progress Notes (Signed)
Inpatient Rehab Admissions:  Inpatient Rehab Consult received.  I met with patient and her spouse at the bedside for rehabilitation assessment and to discuss goals and expectations of an inpatient rehab admission.  Pt working with physical therapy during my visit.  She is mobilizing fairly well, however she is definitely not at her baseline.  I feel she would be an excellent candidate for a short stay on CIR to maximize independence, and pt and her spouse agree.  Dr. Karleen Hampshire in agreement for pt to d/c to CIR today.   Signed: Shann Medal, PT, DPT Admissions Coordinator [phone] 07/04/20  12:04 PM

## 2020-07-04 NOTE — PMR Pre-admission (Signed)
PMR Admission Coordinator Pre-Admission Assessment  Patient: [name] is an 74 y.o., female MRN: [id number] DOB: [birth year] Height: 5\' 7"  (170.2 cm) Weight: 60.3 kg  Insurance Information HMO:     PPO:      PCP:      IPA:      80/20:      OTHER:  PRIMARY: Medicare A and B      Policy#: [id number]      Subscriber: pt CM Name:       Phone#:      Fax#:  Pre-Cert#: verified Civil engineer, contracting:  Benefits:  Phone #:      Name:  Eff. Date: A and B 05/09/11     Deduct: $1484      Out of Pocket Max: n/a      Life Max: n/a CIR: 100%      SNF: 20 full days Outpatient: 80%     Co-Pay: 20% Home Health: 100%      Co-Pay:  DME: 80%     Co-Pay: 20% Providers:  SECONDARYLynda Rainwater      Policy#: [id number]     Phone#: [phone]  Financial Counselor:       Phone#:   The "Data Collection Information Summary" for patients in Inpatient Rehabilitation Facilities with attached "Privacy Act [place] Records" was provided and verbally reviewed with: Patient and Family  Emergency Contact Information Contact Information    Name Relation Home Work Mobile   [place] Spouse [phone]  [phone]   Simeon Craft Daughter   [phone]      Current Medical History  Patient Admitting Diagnosis: CVA   History of Present Illness: [name] is a 74 year old right-handed female with history of prediabetes, history of craniotomy, hypertension, mitral valve replacement maintained on chronic Coumadin, 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  Presented 07/01/2020 with dizziness and weakness x3 days.  MRI showed single small cortical subcortical ischemic nonhemorrhagic infarct involving the parasagittal posterior left frontal lobe.  Underlying moderate to advanced age-related cerebral atrophy with chronic microvascular ischemic disease with multiple remote lacunar infarcts involving the left thalamus and bilateral  cerebellar hemispheres.  CT angiogram of head and neck with no emergent findings.  Patient did not receive TPA.  Admission chemistries INR 2.9, hemoglobin A1c 6.1, chemistries unremarkable except glucose 139.  Echocardiogram with ejection fraction of 60% no wall motion abnormalities.  Presently maintained on low-dose aspirin as well as ongoing chronic Coumadin with a goal INR of 3.0-3.5.  Tolerating a regular diet.  Therapy evaluations completed and patient was recommended for a comprehensive rehab program.  Complete NIHSS TOTAL: 0  Patient's medical record from [facility] has been reviewed by the rehabilitation admission coordinator and physician.  Past Medical History  Past Medical History:  Diagnosis Date  . Abnormality of gait 09/07/2016  . Allergy   . Diabetes mellitus without complication [facility])    Patient denies this - notes history of glucose intolerance  . GERD (gastroesophageal reflux disease)   . Hemiparesis and alteration of sensations as late effects of stroke ([facility]) 09/07/2016  . History of pneumonia 1997  . Hypertension   . S/P MVR (mitral valve replacement)    Mechanical mitral valve replacement at age 28 (done in [place])  // echo 7/17: EF 55-60, normal wall motion, bileaflet mechanical mitral valve prosthesis functioning normally, mild LAE, mildly reduced RVSF, small pericardial effusion  . Stroke [facility]) 1997, 2013, 2015  Family History   family history includes Cancer in her mother; Heart disease in her mother; Hyperlipidemia in her mother; Hypertension in her father and mother; Stroke in her father.  Prior Rehab/Hospitalizations Has the patient had prior rehab or hospitalizations prior to admission? Yes  Has the patient had major surgery during 100 days prior to admission? No   Current Medications  Current Facility-Administered Medications:  .  acetaminophen (TYLENOL) tablet 650 mg, 650 mg, Oral, Q4H PRN **OR** acetaminophen (TYLENOL) 160 MG/5ML solution  650 mg, 650 mg, Per Tube, Q4H PRN **OR** acetaminophen (TYLENOL) suppository 650 mg, 650 mg, Rectal, Q4H PRN, Chotiner, Yevonne Aline, MD .  amLODipine (NORVASC) tablet 10 mg, 10 mg, Oral, QHS, Chotiner, Yevonne Aline, MD, 10 mg at 07/03/20 2057 .  aspirin EC tablet 81 mg, 81 mg, Oral, Daily, Rosalin Hawking, MD, 81 mg at 07/04/20 1103 .  atorvastatin (LIPITOR) tablet 40 mg, 40 mg, Oral, QHS, Rosalin Hawking, MD, 40 mg at 07/03/20 2057 .  docusate sodium (COLACE) capsule 100 mg, 100 mg, Oral, QHS, Chotiner, Yevonne Aline, MD, 100 mg at 07/03/20 2057 .  pantoprazole (PROTONIX) EC tablet 40 mg, 40 mg, Oral, Daily, Chotiner, Yevonne Aline, MD, 40 mg at 07/04/20 1103 .  warfarin (COUMADIN) tablet 5 mg, 5 mg, Oral, ONCE-1600, Hosie Poisson, MD .  Warfarin - Pharmacist Dosing Inpatient, , Oral, q1600, Rosalin Hawking, MD, Given at 07/02/20 1756  Patients Current Diet:  Diet Order            Diet Heart Room service appropriate? No; Fluid consistency: Thin  Diet effective now                 Precautions / Restrictions Precautions Precautions: Fall Precaution Comments: mod fall Other Brace: L AFO Restrictions Weight Bearing Restrictions: No   Has the patient had 2 or more falls or a fall with injury in the past year? No  Prior Activity Level Limited Community (1-2x/wk): was using a SPC only for mobility outside of the home (no DME for household), was mobilizing and completing ADLs with mod I>indep.    Prior Functional Level Self Care: Did the patient need help bathing, dressing, using the toilet or eating? Independent  Indoor Mobility: Did the patient need assistance with walking from room to room (with or without device)? Independent  Stairs: Did the patient need assistance with internal or external stairs (with or without device)? Independent  Functional Cognition: Did the patient need help planning regular tasks such as shopping or remembering to take medications? [place] /  Equipment Home Assistive Devices/Equipment: Cane (specify quad or straight) Home Equipment: Walker - 2 wheels, Cane - single point  Prior Device Use: Indicate devices/aids used by the patient prior to current illness, exacerbation or injury? single point cane for community mobility  Current Functional Level Cognition  Arousal/Alertness: Awake/alert Overall Cognitive Status: Within Functional Limits for tasks assessed Orientation Level: Oriented X4 Memory: Appears intact Awareness: Appears intact Problem Solving: Appears intact Safety/Judgment: Appears intact    Extremity Assessment (includes Sensation/Coordination)  Upper Extremity Assessment: LUE deficits/detail LUE Deficits / Details: hx of prior CVA with L sided spastic hemiparesis; able to use functionally with mild discoordination  LUE Sensation: WNL LUE Coordination: decreased fine motor, decreased gross motor  Lower Extremity Assessment: RLE deficits/detail, LLE deficits/detail RLE Deficits / Details: No overt change in R LE function even with small L frontal infarct. RLE Sensation: WNL LLE Deficits / Details: mild weakness and coordination issues,  wears  AFO at all times. LLE Sensation: WNL LLE Coordination: decreased fine motor (coordination is "spastic" in nature.)    ADLs  Overall ADL's : Needs assistance/impaired Grooming: Min guard, Standing Upper Body Bathing: Minimal assistance, Sitting Lower Body Bathing: Minimal assistance, Sit to/from stand Upper Body Dressing : Minimal assistance, Sitting Lower Body Dressing: Minimal assistance, Sit to/from stand Lower Body Dressing Details (indicate cue type and reason): able to don shoes with min assist, min assist sit to stand  Toilet Transfer: Minimal assistance, Ambulation, Regular Toilet, RW Toileting- Clothing Manipulation and Hygiene: Minimal assistance Toileting - Clothing Manipulation Details (indicate cue type and reason): clothing mgmt  Functional mobility during  ADLs: Minimal assistance, Rolling walker General ADL Comments: pt limited by L sided weakness, impaired balance and decrased activity tolerance    Mobility  Overal bed mobility: Needs Assistance Bed Mobility: Supine to Sit Supine to sit: Supervision General bed mobility comments: Patient received up in bathroom with NT upon arrival    Transfers  Overall transfer level: Needs assistance Equipment used: Rolling walker (2 wheeled) Transfers: Sit to/from Stand Sit to Stand: Min assist General transfer comment: min assist to power up and steady from EOB, increased time and effort with cueing for hand placement and safety     Ambulation / Gait / Stairs / Wheelchair Mobility  Ambulation/Gait Ambulation/Gait assistance: Counsellor (Feet): 150 Feet Assistive device: Rolling walker (2 wheeled), 1 person hand held assist Gait Pattern/deviations: Step-through pattern, Decreased step length - left, Decreased step length - right, Decreased stance time - left, Decreased stride length, Shuffle, Trunk flexed General Gait Details: Pt's gait is spastic and paretic in nature with and IR/ER waggle with each step.  No toe off and flat foot contact. She is more steady with the RW, but her left foot occasionally hits the RW.  Ambulated the last 20 feet with hand held assist and she is less stable and unsure of herself. Gait velocity: decr Gait velocity interpretation: <1.8 ft/sec, indicate of risk for recurrent falls    Posture / Balance Dynamic Sitting Balance Sitting balance - Comments: able to reach outside BOS without lob in sitting Balance Overall balance assessment: Needs assistance Sitting-balance support: No upper extremity supported, Feet supported Sitting balance-Leahy Scale: Good Sitting balance - Comments: able to reach outside BOS without lob in sitting Standing balance support: Bilateral upper extremity supported, During functional activity Standing balance-Leahy Scale:  Fair Standing balance comment: Reliant on B UE support with dynamic standing. Unsure of herself and less steady with single UE support.    Special needs/care consideration n/a   Previous Home Environment (from acute therapy documentation) Living Arrangements: Spouse/significant other  Lives With: Spouse Available Help at Discharge: Family, Industrial/product designer, Available 24 hours/day Type of Home: House (town house ) Home Layout: Able to live on main level with bedroom/bathroom, Two level Home Access: Level entry Bathroom Shower/Tub: Walk-in shower (pt sponge bathes only ) Biochemist, clinical: [place]: No  Discharge Living Setting Plans for Discharge Living Setting: Patient's home Type of Home at Discharge: House (town home) Discharge Home Layout: Able to live on main level with bedroom/bathroom Discharge Home Access: Level entry Discharge Bathroom Shower/Tub: Walk-in shower (pt sponge bathes) Discharge Bathroom Toilet: Standard Discharge Bathroom Accessibility: Yes How Accessible: Accessible via walker Does the patient have any problems obtaining your medications?: No  Social/Family/Support Systems Anticipated Caregiver: Lavonne Kinderman Anticipated Caregiver's Contact Information: [phone] Ability/Limitations of Caregiver: n/a Caregiver Availability: 24/7 Discharge Plan Discussed with Primary Caregiver: Yes  Is Caregiver In Agreement with Plan?: Yes Does Caregiver/Family have Issues with Lodging/Transportation while Pt is in Rehab?: No  Goals Patient/Family Goal for Rehab: PT/OT mod I, SLP n/a Expected length of stay: 5-7 days Additional Information: prior admit to Dr. Letta Pate team in Oct 2020 Pt/Family Agrees to Admission and willing to participate: Yes Program Orientation Provided & Reviewed with Pt/Caregiver Including Roles  & Responsibilities: Yes  Decrease burden of Care through IP rehab admission: n/a  Possible need for SNF placement upon discharge: Not  anticipated  Patient Condition: I have reviewed medical records from [facility], spoken with CSW, and patient and spouse. I met with patient at the bedside for inpatient rehabilitation assessment.  Patient will benefit from ongoing PT and OT, can actively participate in 3 hours of therapy a day 5 days of the week, and can make measurable gains during the admission.  Patient will also benefit from the coordinated team approach during an Inpatient Acute Rehabilitation admission.  The patient will receive intensive therapy as well as Rehabilitation physician, nursing, social worker, and care management interventions.  Due to safety, disease management and patient education the patient requires 24 hour a day rehabilitation nursing.  The patient is currently min with mobility and basic ADLs.  Discharge setting and therapy post discharge at home with outpatient is anticipated.  Patient has agreed to participate in the Acute Inpatient Rehabilitation Program and will admit today.  Preadmission Screen Completed By:  Bonnee Quin, DPT 07/04/2020 12:04 PM ______________________________________________________________________   Discussed status with Dr. Ranell Patrick on 07/04/20 at 12:08 PM  and received approval for admission today.  Admission Coordinator:  Michel Santee, PT, DPT time 12:08 PM Sudie Grumbling 07/04/20    Assessment/Plan: Diagnosis: Left frontal lobe ischemic infarct 1. Does the need for close, 24 hr/day Medical supervision in concert with the patient's rehab needs make it unreasonable for this patient to be served in a less intensive setting? Yes 2. Co-Morbidities requiring supervision/potential complications: HTN, HLD, Left sided hemiparesis due to previous stroke, h/o mitral valve replacement with mechanical valve, vertigo, DM without complication 3. Due to bladder management, bowel management, safety, skin/wound care, disease management, medication administration, pain management and patient  education, does the patient require 24 hr/day rehab nursing? Yes 4. Does the patient require coordinated care of a physician, rehab nurse, PT, OT to address physical and functional deficits in the context of the above medical diagnosis(es)? Yes Addressing deficits in the following areas: balance, endurance, locomotion, strength, transferring, bowel/bladder control, bathing, dressing, feeding, grooming, toileting and psychosocial support 5. Can the patient actively participate in an intensive therapy program of at least 3 hrs of therapy 5 days a week? Yes 6. The potential for patient to make measurable gains while on inpatient rehab is excellent 7. Anticipated functional outcomes upon discharge from inpatient rehab: modified independent PT, modified independent OT, independent SLP 8. Estimated rehab length of stay to reach the above functional goals is: 6-8 days 9. Anticipated discharge destination: Home 10. Overall Rehab/Functional Prognosis: excellent   MD Signature: Leeroy Cha, MD

## 2020-07-04 NOTE — H&P (Signed)
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  Per chart review lives with spouse.  Two-level home bed and bath main level.  Independent with light ADLs.  She uses a cane for ambulation as well as left AFO.  She has a son in [place].  Presented 07/01/2020 with dizziness and weakness x3 days.  MRI showed single small cortical subcortical ischemic nonhemorrhagic infarct involving the parasagittal posterior left frontal lobe.  Underlying moderate to advanced age-related cerebral atrophy with chronic microvascular ischemic disease with multiple remote lacunar infarcts involving the left thalamus and bilateral cerebellar hemispheres.  CT angiogram of head and neck with no emergent findings.  Patient did not receive TPA.  Admission chemistries INR 2.9, hemoglobin A1c 6.1, chemistries unremarkable except glucose 139.  Echocardiogram with ejection fraction of 60% no wall motion abnormalities.  Presently maintained on low-dose aspirin as well as ongoing chronic Coumadin with a goal INR of 3.0-3.5.  Tolerating a regular diet.  Therapy evaluations completed and patient was admitted for a comprehensive rehab program.  Review of Systems  Constitutional: Negative for chills and fever.  HENT: Negative for hearing loss.   Eyes: Negative for blurred vision and double vision.  Respiratory: Negative for shortness of breath.   Cardiovascular: Negative for chest pain, palpitations and leg swelling.  Gastrointestinal: Positive for constipation. Negative for heartburn, nausea and vomiting.       GERD  Genitourinary: Negative for dysuria, flank pain and hematuria.  Musculoskeletal: Positive  for joint pain and myalgias.  Skin: Negative for rash.  Neurological: Positive for dizziness, sensory change and weakness.  All other systems reviewed and are negative.  Past Medical History:  Diagnosis Date  . Abnormality of gait 09/07/2016  . Allergy   . Diabetes mellitus without complication [facility])    Patient denies this - notes history of glucose intolerance  . GERD (gastroesophageal reflux disease)   . Hemiparesis and alteration of sensations as late effects of stroke ([place]) 09/07/2016  . History of pneumonia 1997  . Hypertension   . S/P MVR (mitral valve replacement)    Mechanical mitral valve replacement at age 71 (done in [place])  // echo 7/17: EF 55-60, normal wall motion, bileaflet mechanical mitral valve prosthesis functioning normally, mild LAE, mildly reduced RVSF, small pericardial effusion  . Stroke [facility]) 1997, 2013, 2015   Past Surgical History:  Procedure Laterality Date  . ABDOMINAL HYSTERECTOMY    . BRAIN SURGERY    . BUNIONECTOMY  1993  . CARDIAC VALVE REPLACEMENT  1997  . TOE SURGERY  1996   Family History  Problem Relation Age of Onset  . Cancer Mother        Bone  . Heart disease Mother   . Hyperlipidemia Mother   . Hypertension Mother   . Stroke Father   . Hypertension Father   . Heart attack Neg Hx    Social History:  reports that she has never smoked. She has never used smokeless tobacco. She reports that she does not drink alcohol and does not use drugs. Allergies:  Allergies  Allergen Reactions  . Zoloft [Sertraline] Hives, Itching and Rash   Medications Prior to Admission  Medication Sig Dispense Refill  . acetaminophen (TYLENOL) 325 MG tablet Take 2  tablets (650 mg total) by mouth every 4 (four) hours as needed for mild pain (or temp > 37.5 C (99.5 F)). (Patient taking differently: Take 650 mg by mouth daily. )    . amLODipine (NORVASC) 10 MG tablet Take 1 tablet (10 mg total) by mouth daily. (Patient taking differently: Take 10 mg by mouth  at bedtime. ) 30 tablet 0  . aspirin EC 81 MG tablet Take 81 mg by mouth daily.     Marland Kitchen atorvastatin (LIPITOR) 40 MG tablet Take 1 tablet (40 mg total) by mouth at bedtime. 30 tablet 2  . calcium carbonate 1250 MG capsule Take 1,200 mg by mouth at bedtime.     . cholecalciferol (VITAMIN D) 1000 UNITS tablet Take 2,000 Units daily by mouth.     . docusate sodium (COLACE) 100 MG capsule Take 100 mg at bedtime by mouth.     . Menthol, Topical Analgesic, (BENGAY EX) Apply 1 application topically daily. All painful areas    . pantoprazole (PROTONIX) 40 MG tablet Take 1 tablet (40 mg total) by mouth daily. 30 tablet 1  . Propylene Glycol-Glycerin (SOOTHE) 0.6-0.6 % SOLN Place 1 drop into both eyes 2 (two) times daily as needed (dry eyes).    . warfarin (COUMADIN) 5 MG tablet TAKE AS DIRECTED PER COUMADIN CLINIC FOR 30 DAYS. 50 tablet 3    Drug Regimen Review  Drug regimen was reviewed and remains appropriate with no significant issues identified  Home: Home Living Family/patient expects to be discharged to:: Private residence Living Arrangements: Spouse/significant other Available Help at Discharge: Family, Industrial/product designer, Available 24 hours/day Type of Home: House (town house ) Home Access: Level entry Home Layout: Able to live on main level with bedroom/bathroom, Two level Bathroom Shower/Tub: Walk-in shower (pt sponge bathes only ) Biochemist, clinical: Standard Home Equipment: Environmental consultant - 2 wheels, Radio producer - single point  Lives With: Spouse   Functional History: Prior Function Level of Independence: Needs assistance Gait / Transfers Assistance Needed: uses cane in commuinty, nothing at home; L AFO 24/7 ADL's / Homemaking Assistance Needed: sponge bathes, independent ADLS; light IADLs   Functional Status:  Mobility: Bed Mobility Overal bed mobility: Needs Assistance Bed Mobility: Supine to Sit Supine to sit: Supervision General bed mobility comments: Up with OT on arrival Transfers Overall  transfer level: Needs assistance Equipment used: Rolling walker (2 wheeled) Transfers: Sit to/from Stand Sit to Stand: Min assist General transfer comment: min assist to power up and steady from EOB, increased time and effort with cueing for hand placement and safety  Ambulation/Gait Ambulation/Gait assistance: Min assist Gait Distance (Feet): 80 Feet (total with RW and without RW, but use of the rail) Assistive device: Rolling walker (2 wheeled) (rail) Gait Pattern/deviations: Step-through pattern, Decreased step length - left, Decreased step length - right, Decreased stance time - left, Decreased stride length General Gait Details: Pt's gait is spastic and paretic in nature with and IR/ER waggle with each step.  No toe off and flat foot contact. She is likely more steady with the RW, but her left foot occasionally hits the RW.  With the rail she is still fairly stable, if not slow and paretic Gait velocity: slower Gait velocity interpretation: <1.8 ft/sec, indicate of risk for recurrent falls  ADL: ADL Overall ADL's : Needs assistance/impaired Grooming: Min guard, Standing Upper Body Bathing: Minimal assistance, Sitting Lower Body Bathing: Minimal assistance, Sit to/from stand Upper Body Dressing : Minimal assistance, Sitting Lower Body Dressing: Minimal assistance, Sit to/from stand  Lower Body Dressing Details (indicate cue type and reason): able to don shoes with min assist, min assist sit to stand  Toilet Transfer: Minimal assistance, Ambulation, Regular Toilet, RW Toileting- Clothing Manipulation and Hygiene: Minimal assistance Toileting - Clothing Manipulation Details (indicate cue type and reason): clothing mgmt  Functional mobility during ADLs: Minimal assistance, Rolling walker General ADL Comments: pt limited by L sided weakness, impaired balance and decrased activity tolerance  Cognition: Cognition Overall Cognitive Status: Within Functional Limits for tasks  assessed Arousal/Alertness: Awake/alert Orientation Level: Oriented X4 Memory: Appears intact Awareness: Appears intact Problem Solving: Appears intact Safety/Judgment: Appears intact Cognition Arousal/Alertness: Awake/alert Behavior During Therapy: WFL for tasks assessed/performed Overall Cognitive Status: Within Functional Limits for tasks assessed   Physical Exam: Height 5\' 7"  (1.702 m), weight 59.2 kg. General: Alert and oriented x 3, No apparent distress HEENT: Head is normocephalic, atraumatic, PERRLA, EOMI, sclera anicteric, oral mucosa pink and moist, dentition intact, ext ear canals clear,  Neck: Supple without JVD or lymphadenopathy Heart: Reg rate and rhythm. No murmurs rubs or gallops Chest: CTA bilaterally without wheezes, rales, or rhonchi; no distress Abdomen: Soft, non-tender, non-distended, bowel sounds positive. Extremities: No clubbing, cyanosis, or edema. Pulses are 2+ Skin: Clean and intact without signs of breakdown; IV right arm Neuro:  Patient is alert in no acute distress.  Makes good eye contact with examiner and follows commands.  Fair awareness of deficits. 5/5 strength on right side and 4/5 in left leg.   Psych: Pt's affect is appropriate. Pt is cooperative   Results for orders placed or performed during the hospital encounter of 07/01/20 (from the past 48 hour(s))  Protime-INR     Status: Abnormal   Collection Time: 07/03/20  3:52 AM  Result Value Ref Range   Prothrombin Time 25.6 (H) 11.4 - 15.2 seconds   INR 2.4 (H) 0.8 - 1.2    Comment: (NOTE) INR goal varies based on device and disease states. Performed at [facility], [place] [address], [place], [place] [address]   Protime-INR     Status: Abnormal   Collection Time: 07/04/20  3:43 AM  Result Value Ref Range   Prothrombin Time 30.6 (H) 11.4 - 15.2 seconds   INR 3.1 (H) 0.8 - 1.2    Comment: (NOTE) INR goal varies based on device and disease states. Performed at [facility], [place] [address], [place], [place] [address]    No results found.     Medical Problem List and Plan: 1.  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  Antithrombotics: -DVT/anticoagulation: Chronic Coumadin for history of MVR.  Goal INR 3.0-3.5. INR is 3.1 on 8/27.   -antiplatelet therapy: Aspirin 81 mg daily 3. Pain Management: Tylenol as needed. Denies pain.  4. Mood: Advised emotional support  -antipsychotic agents: N/A 5. Neuropsych: This patient is capable of making decisions on her own behalf. 6. Skin/Wound Care: Routine skin checks. May remove IV 8/27. 7. Fluids/Electrolytes/Nutrition: Routine in and outs with follow-up chemistries 8.  Hypertension.  Norvasc 10 mg daily.  Monitor with increased mobility. Well controlled. 9.  Hyperlipidemia.  Lipitor 10.  History of mitral valve replacement.  Continue chronic Coumadin.  No chest pain or shortness of breath 11.  Prediabetes.  Hemoglobin A1c 6.1.  Blood sugar checks discontinued 12.  GERD.  Protonix  Lavon Paganini Angiulli, PA-C 07/04/2020   I have personally performed a face to face diagnostic evaluation, including, but not limited to relevant history and physical exam findings, of this patient and developed relevant assessment and plan.  Additionally, I have reviewed and concur with the physician assistant's documentation above.  The patient's status has not changed. The original post admission physician evaluation remains appropriate, and any changes from the pre-admission screening or documentation from the acute chart are noted above.   Leeroy Cha, MD

## 2020-07-04 NOTE — Progress Notes (Signed)
Inpatient Rehabilitation Medication Review by a Pharmacist  A complete drug regimen review was completed for this patient to identify any potential clinically significant medication issues.  Clinically significant medication issues were identified:  no  Check AMION for pharmacist assigned to patient if future medication questions/issues arise during this admission.  Pharmacist comments:  PTA meds not resumed (all OTCs): Calcium Carbonate 1200mg  QHS Vit D 2000 units daily Artificial Tears PRN  No intervention needed  Time spent performing this drug regimen review (minutes):  10   Omolola Mittman, Rocky Crafts 07/04/2020 5:28 PM

## 2020-07-04 NOTE — Discharge Summary (Signed)
Physician Discharge Summary  [name] FHL:[id number] DOB: 1946-04-23 DOA: 07/01/2020  PCP: Haywood Pao, MD  Admit date: 07/01/2020 Discharge date: 07/04/2020  Admitted From:  HOME.  Disposition:  CIR  Recommendations for Outpatient Follow-up:  1. Follow up with PCP in 1-2 weeks 2. Please obtain BMP/CBC in one week 3. Please follow up with neurology as recommended.     Discharge Condition stable.  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  Patient reports her symptoms started 3 days prior to admission.  MRI of the brain was done showed cortical areas of acute ischemic stroke in the left hemisphere.  INR was done as an outpatient yesterday and therapeutic at 3.2.  Neurologist consulted for recommendations.  Recommends aspirin 81 mg daily in addition to Coumadin with INR goal of 3-3.5.  Patient seen and examined this morning denies any new complaints.   Discharge Diagnoses:  Principal Problem:   Acute CVA (cerebrovascular accident) ([place]) Active Problems:   Hemiparesis (L sided - mild) due to old stroke [facility])   H/O mitral valve replacement with mechanical valve   HTN (hypertension)   Long term (current) use of anticoagulants [Z79.01]   Hyperlipemia   Vertigo   Goals of care, counseling/discussion   Palliative care by specialist  Acute left hemispheric CVA ~Recommended to continue with Coumadin with an INR goal of 3-3.5. Get echocardiogram showing Left ventricular ejection fraction, by estimation, is 55 to 60%. The left ventricle has normal function. The left ventricle has no regional wall motion abnormalities. There is mild concentric left ventricular hypertrophy. Left ventricular diastolic function could not  be evaluated.,  CT angiogram of the head and neck  shows No emergent finding.  Mild atherosclerosis without flow limiting stenosis or embolic source identified in the head and neck.  SLP eval passed swallowing evaluation. PT OT evaluations recommending CIR, CIR consult in place Neurology consulted recommended continue with aspirin and Coumadin with an INR goal of 3-3.5. Today's INR is 2.4, Lovenox added for bridge after discussing with neurology. Hemoglobin A1c is 6.1.  LDL is 75. Continue with aspirin 81 mg and Lipitor 40 mg daily.   Hyperlipidemia with Lipitor 40 mg daily   History of mitral valve replacement with mechanical valve Continue with Coumadin this morning, INR is subtherapeutic at 2.4 and Lovenox to bridge.    Hypertension Continue with Norvasc.  Dizziness, vertigo, history of BPPV PT evaluation recommending CIR, consult placed.   Due to recurrent symptoms, multiple comorbidities, cognitive deficits, deconditioning and debility palliative care consulted for goals of care discussion.    Discharge Instructions   Allergies as of 07/04/2020      Reactions   Zoloft [sertraline] Hives, Itching, Rash      Medication List    STOP taking these medications   amoxicillin 500 MG capsule Commonly known as: AMOXIL   omeprazole 20 MG capsule Commonly known as: PRILOSEC Replaced by: pantoprazole 40 MG tablet     TAKE these medications   acetaminophen 325 MG tablet Commonly known as: TYLENOL Take 2 tablets (650 mg total) by mouth every 4 (four) hours as needed for mild pain (or temp > 37.5 C (99.5 F)). What changed: when to take this   amLODipine 10 MG tablet Commonly known as: NORVASC Take 1 tablet (10 mg total) by  mouth daily. What changed: when to take this   aspirin EC 81 MG tablet Take 81 mg by mouth daily.   atorvastatin 40 MG tablet Commonly known as: LIPITOR Take 1 tablet (40 mg total) by mouth at bedtime. What changed:   medication  strength  how much to take  when to take this   BENGAY EX Apply 1 application topically daily. All painful areas   calcium carbonate 1250 MG capsule Take 1,200 mg by mouth at bedtime.   cholecalciferol 1000 units tablet Commonly known as: VITAMIN D Take 2,000 Units daily by mouth.   docusate sodium 100 MG capsule Commonly known as: COLACE Take 100 mg at bedtime by mouth.   pantoprazole 40 MG tablet Commonly known as: PROTONIX Take 1 tablet (40 mg total) by mouth daily. Replaces: omeprazole 20 MG capsule   Soothe 0.6-0.6 % Soln Generic drug: Propylene Glycol-Glycerin Place 1 drop into both eyes 2 (two) times daily as needed (dry eyes).   warfarin 5 MG tablet Commonly known as: COUMADIN Take as directed. If you are unsure how to take this medication, talk to your nurse or doctor. Original instructions: TAKE AS DIRECTED PER COUMADIN CLINIC FOR 30 DAYS. What changed: additional instructions       Follow-up Information    Frann Rider, NP. Go on 08/05/2020.   Specialty: Neurology Contact information: [address] [place] [address] [phone]              Allergies  Allergen Reactions  . Zoloft [Sertraline] Hives, Itching and Rash    Consultations:  Neurology.    Procedures/Studies: CT ANGIO HEAD W OR WO CONTRAST  Result Date: 07/02/2020 CLINICAL DATA:  Dizziness and weakness. History of stroke with residual left-sided weakness. EXAM: CT ANGIOGRAPHY HEAD AND NECK TECHNIQUE: Multidetector CT imaging of the head and neck was performed using the standard protocol during bolus administration of intravenous contrast. Multiplanar CT image reconstructions and MIPs were obtained to evaluate the vascular anatomy. Carotid stenosis measurements (when applicable) are obtained utilizing NASCET criteria, using the distal internal carotid diameter as the denominator. CONTRAST:  34mL OMNIPAQUE IOHEXOL 350 MG/ML SOLN COMPARISON:  Brain MRI from yesterday.  CTA head  neck 09/04/2019 FINDINGS: CT HEAD FINDINGS Brain: The patient's left frontal acute infarct is not readily identified by noncontrast CT. Chronic encephalomalacia and high-density nodule centered in the right parietal lobe, deep to a craniotomy. There is chronic small vessel ischemia including a remote lacunar infarct at the left thalamus. Right more than left small cerebellar infarcts. No acute hemorrhage, hydrocephalus, or masslike finding Vascular: See below Skull: Unremarkable right parietal craniotomy. No destructive findings Sinuses: Clear Orbits: Negative CTA NECK FINDINGS Aortic arch: Limited coverage of the arch which shows no dilatation or acute finding. Right carotid system: Mild atheromatous changes at the proximal ICA. No dissection or beading. Left carotid system: Mild atheromatous plaque at the carotid bifurcation. No stenosis, ulceration, or beading. Vertebral arteries: Proximal subclavian atherosclerosis without flow limiting stenosis or ulceration. The vertebral arteries are smooth and widely patent to the dura. Skeleton: Cervical disc and facet degeneration. No acute or aggressive finding Other neck: No significant incidental finding. Upper chest: Negative Review of the MIP images confirms the above findings CTA HEAD FINDINGS Anterior circulation: Less than typical atheromatous changes on the siphons. No branch occlusion, beading, or aneurysm. Posterior circulation: Vertebral and basilar arteries are smooth and widely patent. Unremarkable cerebellar branching. Hypoplastic left P1 segment. Venous sinuses: Diffusely patent Anatomic variants: As above Review of  the MIP images confirms the above findings IMPRESSION: 1. No emergent finding. 2. Mild atherosclerosis without flow limiting stenosis or embolic source identified in the head and neck. 3. Intracranial findings as described on preceding brain MRI. Electronically Signed   By: Monte Fantasia M.D.   On: 07/02/2020 04:46   CT ANGIO NECK W OR WO  CONTRAST  Result Date: 07/02/2020 CLINICAL DATA:  Dizziness and weakness. History of stroke with residual left-sided weakness. EXAM: CT ANGIOGRAPHY HEAD AND NECK TECHNIQUE: Multidetector CT imaging of the head and neck was performed using the standard protocol during bolus administration of intravenous contrast. Multiplanar CT image reconstructions and MIPs were obtained to evaluate the vascular anatomy. Carotid stenosis measurements (when applicable) are obtained utilizing NASCET criteria, using the distal internal carotid diameter as the denominator. CONTRAST:  33mL OMNIPAQUE IOHEXOL 350 MG/ML SOLN COMPARISON:  Brain MRI from yesterday.  CTA head neck 09/04/2019 FINDINGS: CT HEAD FINDINGS Brain: The patient's left frontal acute infarct is not readily identified by noncontrast CT. Chronic encephalomalacia and high-density nodule centered in the right parietal lobe, deep to a craniotomy. There is chronic small vessel ischemia including a remote lacunar infarct at the left thalamus. Right more than left small cerebellar infarcts. No acute hemorrhage, hydrocephalus, or masslike finding Vascular: See below Skull: Unremarkable right parietal craniotomy. No destructive findings Sinuses: Clear Orbits: Negative CTA NECK FINDINGS Aortic arch: Limited coverage of the arch which shows no dilatation or acute finding. Right carotid system: Mild atheromatous changes at the proximal ICA. No dissection or beading. Left carotid system: Mild atheromatous plaque at the carotid bifurcation. No stenosis, ulceration, or beading. Vertebral arteries: Proximal subclavian atherosclerosis without flow limiting stenosis or ulceration. The vertebral arteries are smooth and widely patent to the dura. Skeleton: Cervical disc and facet degeneration. No acute or aggressive finding Other neck: No significant incidental finding. Upper chest: Negative Review of the MIP images confirms the above findings CTA HEAD FINDINGS Anterior circulation: Less  than typical atheromatous changes on the siphons. No branch occlusion, beading, or aneurysm. Posterior circulation: Vertebral and basilar arteries are smooth and widely patent. Unremarkable cerebellar branching. Hypoplastic left P1 segment. Venous sinuses: Diffusely patent Anatomic variants: As above Review of the MIP images confirms the above findings IMPRESSION: 1. No emergent finding. 2. Mild atherosclerosis without flow limiting stenosis or embolic source identified in the head and neck. 3. Intracranial findings as described on preceding brain MRI. Electronically Signed   By: Monte Fantasia M.D.   On: 07/02/2020 04:46   MR BRAIN WO CONTRAST  Result Date: 07/01/2020 CLINICAL DATA:  Initial evaluation for neuro deficit, stroke suspected. EXAM: MRI HEAD WITHOUT CONTRAST TECHNIQUE: Multiplanar, multiecho pulse sequences of the brain and surrounding structures were obtained without intravenous contrast. COMPARISON:  Prior Study from 09/03/2019. FINDINGS: Brain: Moderate to advanced generalized age-related cerebral atrophy. Patchy and confluent T2/FLAIR hyperintensity within the periventricular deep white matter both cerebral hemispheres most consistent with chronic small vessel ischemic disease, moderate in nature. Multiple scattered remote lacunar infarcts noted involving the left thalamus. Multiple scattered small remote bilateral cerebellar infarcts noted as well. Large area of encephalomalacia and gliosis involving the cortical and subcortical right parietal lobe again noted. Associated scattered areas of chronic hemosiderin staining. Sequelae of overlying right craniotomy. Few scattered foci of diffusion abnormality within this region favored to be related to susceptibility artifact rather than acute ischemia. Single small curvilinear cortical subcortical ischemic infarcts seen involving the parasagittal posterior left frontal lobe (series 2, images 43, 40, 39). No  associated hemorrhage or mass effect. No  other convincing foci of restricted diffusion to suggest acute or subacute ischemia. Gray-white matter differentiation otherwise maintained. No acute intracranial hemorrhage. Additional single punctate chronic microhemorrhage noted within the left temporal lobe. No mass lesion, midline shift or mass effect. Mild ventricular prominence related global parenchymal volume loss without hydrocephalus. Mild ex vacuo dilatation of the right lateral ventricle related to the right parietal encephalomalacia noted as well. No extra-axial fluid collection. Pituitary gland suprasellar region normal. Midline structures intact. Vascular: Major intracranial vascular flow voids are maintained. Skull and upper cervical spine: Craniocervical junction within normal limits. Bone marrow signal intensity normal. Remote right parietal craniotomy noted. No acute scalp soft tissue abnormality. Sinuses/Orbits: Globes and orbital soft tissues within normal limits. Paranasal sinuses are largely clear. No significant mastoid effusion. Other: None. IMPRESSION: 1. Single small curvilinear cortical to subcortical ischemic nonhemorrhagic infarct involving the parasagittal posterior left frontal lobe. 2. No other acute intracranial abnormality. 3. Chronic right parietal encephalomalacia and gliosis with associated chronic hemosiderin staining and sequelae of overlying right parietal craniotomy, stable. 4. Underlying moderate to advanced age-related cerebral atrophy with chronic microvascular ischemic disease, with multiple remote lacunar infarcts involving the left thalamus and bilateral cerebellar hemispheres. Electronically Signed   By: Jeannine Boga M.D.   On: 07/01/2020 20:51   ECHOCARDIOGRAM COMPLETE  Result Date: 07/02/2020    ECHOCARDIOGRAM REPORT   Patient Name:   [name] Date of Exam: 07/02/2020 Medical Rec #:  [id number]      Height:       67.0 in Accession #:    [id number]     Weight:       133.0 lb Date of Birth:  03/29/1946       BSA:          1.700 m Patient Age:    49 years       BP:           123/74 mmHg Patient Gender: F              HR:           76 bpm. Exam Location:  Inpatient Procedure: 2D Echo, Cardiac Doppler and Color Doppler Indications:    Stroke  History:        Patient has prior history of Echocardiogram examinations, most                 recent 09/04/2019. Risk Factors:Diabetes, Hypertension and                 Dyslipidemia. CVA. H/O mechanical mitral valve replacement.                  Mitral Valve: mechanical valve valve is present in the mitral                 position.  Sonographer:    Clayton Lefort RDCS (AE) Referring Phys: [id number] [place]  1. Left ventricular ejection fraction, by estimation, is 55 to 60%. The left ventricle has normal function. The left ventricle has no regional wall motion abnormalities. There is mild concentric left ventricular hypertrophy. Left ventricular diastolic function could not be evaluated.  2. Right ventricular systolic function is normal. The right ventricular size is normal. There is normal pulmonary artery systolic pressure. The estimated right ventricular systolic pressure is 56.4 mmHg.  3. Right atrial size was mildly dilated.  4. Mechanical mitral valve replacement. Normal gradient. EOA 1.84 cm2, DI 1.7, E  max 1.1 m/s, PHT 99 msec. The mitral valve has been repaired/replaced. No evidence of mitral valve regurgitation. The mean mitral valve gradient is 3.0 mmHg with average heart rate of 76 bpm. There is a mechanical valve present in the mitral position. Echo findings are consistent with normal structure and function of the mitral valve prosthesis.  5. The aortic valve is tricuspid. Aortic valve regurgitation is not visualized. Mild aortic valve sclerosis is present, with no evidence of aortic valve stenosis.  6. The inferior vena cava is normal in size with greater than 50% respiratory variability, suggesting right atrial pressure of 3 mmHg. Comparison(s):  No significant change from prior study. FINDINGS  Left Ventricle: Left ventricular ejection fraction, by estimation, is 55 to 60%. The left ventricle has normal function. The left ventricle has no regional wall motion abnormalities. The left ventricular internal cavity size was normal in size. There is  mild concentric left ventricular hypertrophy. Abnormal (paradoxical) septal motion consistent with post-operative status. Left ventricular diastolic function could not be evaluated due to mitral valve replacement. Left ventricular diastolic function could not be evaluated. Right Ventricle: The right ventricular size is normal. No increase in right ventricular wall thickness. Right ventricular systolic function is normal. There is normal pulmonary artery systolic pressure. The tricuspid regurgitant velocity is 2.43 m/s, and  with an assumed right atrial pressure of 3 mmHg, the estimated right ventricular systolic pressure is 26.8 mmHg. Left Atrium: Left atrial size was not well visualized. Right Atrium: Right atrial size was mildly dilated. Pericardium: A small pericardial effusion is present. The pericardial effusion is circumferential. Mitral Valve: Mechanical mitral valve replacement. Normal gradient. EOA 1.84 cm2, DI 1.7, E max 1.1 m/s, PHT 99 msec. The mitral valve has been repaired/replaced. No evidence of mitral valve regurgitation. There is a mechanical valve present in the mitral position. Echo findings are consistent with normal structure and function of the mitral valve prosthesis. MV peak gradient, 5.5 mmHg. The mean mitral valve gradient is 3.0 mmHg with average heart rate of 76 bpm. Tricuspid Valve: The tricuspid valve is grossly normal. Tricuspid valve regurgitation is mild . No evidence of tricuspid stenosis. Aortic Valve: The aortic valve is tricuspid. . There is mild thickening and mild calcification of the aortic valve. Aortic valve regurgitation is not visualized. Mild aortic valve sclerosis is  present, with no evidence of aortic valve stenosis. There is mild thickening of the aortic valve. There is mild calcification of the aortic valve. Aortic valve mean gradient measures 2.0 mmHg. Aortic valve peak gradient measures 4.2 mmHg. Aortic valve area, by VTI measures 2.26 cm. Pulmonic Valve: The pulmonic valve was grossly normal. Pulmonic valve regurgitation is trivial. No evidence of pulmonic stenosis. Aorta: The aortic root and ascending aorta are structurally normal, with no evidence of dilitation. Venous: The inferior vena cava is normal in size with greater than 50% respiratory variability, suggesting right atrial pressure of 3 mmHg. IAS/Shunts: The atrial septum is grossly normal.  LEFT VENTRICLE PLAX 2D LVIDd:         3.50 cm LVIDs:         2.30 cm LV PW:         1.40 cm LV IVS:        1.20 cm LVOT diam:     2.00 cm LV SV:         46 LV SV Index:   27 LVOT Area:     3.14 cm  RIGHT VENTRICLE  IVC RV Basal diam:  3.30 cm     IVC diam: 1.00 cm RV S prime:     10.70 cm/s TAPSE (M-mode): 2.2 cm LEFT ATRIUM             Index       RIGHT ATRIUM           Index LA diam:        2.80 cm 1.65 cm/m  RA Area:     21.30 cm LA Vol (A2C):   63.7 ml 37.46 ml/m RA Volume:   65.30 ml  38.40 ml/m LA Vol (A4C):   48.7 ml 28.64 ml/m LA Biplane Vol: 56.3 ml 33.11 ml/m  AORTIC VALVE AV Area (Vmax):    2.21 cm AV Area (Vmean):   2.05 cm AV Area (VTI):     2.26 cm AV Vmax:           102.00 cm/s AV Vmean:          74.800 cm/s AV VTI:            0.206 m AV Peak Grad:      4.2 mmHg AV Mean Grad:      2.0 mmHg LVOT Vmax:         71.80 cm/s LVOT Vmean:        48.800 cm/s LVOT VTI:          0.148 m LVOT/AV VTI ratio: 0.72  AORTA Ao Root diam: 3.80 cm Ao Asc diam:  3.40 cm MITRAL VALVE            TRICUSPID VALVE MV Area VTI:  1.84 cm  TR Peak grad:   23.6 mmHg MV Peak grad: 5.5 mmHg  TR Vmax:        243.00 cm/s MV Mean grad: 3.0 mmHg MV Vmax:      1.17 m/s  SHUNTS MV Vmean:     83.5 cm/s Systemic VTI:  0.15 m MV  VTI:       0.25 m    Systemic Diam: 2.00 cm Eleonore Chiquito MD Electronically signed by Eleonore Chiquito MD Signature Date/Time: 07/02/2020/12:55:23 PM    Final    Subjective: No new complaints.  Discharge Exam: Vitals:   07/04/20 0401 07/04/20 0700  BP: 121/88 135/76  Pulse: 81 75  Resp: 17 16  Temp: 97.9 F (36.6 C) 97.9 F (36.6 C)  SpO2: 98% 98%   Vitals:   07/03/20 1924 07/03/20 2346 07/04/20 0401 07/04/20 0700  BP: 118/70 110/73 121/88 135/76  Pulse: 71 69 81 75  Resp: 17 18 17 16   Temp: 98.4 F (36.9 C) 98 F (36.7 C) 97.9 F (36.6 C) 97.9 F (36.6 C)  TempSrc: Oral  Oral Oral  SpO2: 97% 97% 98% 98%  Weight:      Height:        General: Pt is alert, awake, not in acute distress Cardiovascular: RRR, S1/S2 +, no rubs, no gallops Respiratory: CTA bilaterally, no wheezing, no rhonchi Abdominal: Soft, NT, ND, bowel sounds + Extremities: no edema, no cyanosis    The results of significant diagnostics from this hospitalization (including imaging, microbiology, ancillary and laboratory) are listed below for reference.     Microbiology: Recent Results (from the past 240 hour(s))  SARS Coronavirus 2 by RT PCR (hospital order, performed in [facility] hospital lab) Nasopharyngeal Nasopharyngeal Swab     Status: None   Collection Time: 07/01/20  9:58 PM   Specimen: Nasopharyngeal Swab  Result Value Ref  Range Status   SARS Coronavirus 2 NEGATIVE NEGATIVE Final    Comment: (NOTE) SARS-CoV-2 target nucleic acids are NOT DETECTED.  The SARS-CoV-2 RNA is generally detectable in upper and lower respiratory specimens during the acute phase of infection. The lowest concentration of SARS-CoV-2 viral copies this assay can detect is 250 copies / mL. A negative result does not preclude SARS-CoV-2 infection and should not be used as the sole basis for treatment or other patient management decisions.  A negative result may occur with improper specimen collection / handling,  submission of specimen other than nasopharyngeal swab, presence of viral mutation(s) within the areas targeted by this assay, and inadequate number of viral copies (<250 copies / mL). A negative result must be combined with clinical observations, patient history, and epidemiological information.  Fact Sheet for Patients:   StrictlyIdeas.no  Fact Sheet for Healthcare Providers: BankingDealers.co.za  This test is not yet approved or  cleared by the [place] FDA and has been authorized for detection and/or diagnosis of SARS-CoV-2 by FDA under an Emergency Use Authorization (EUA).  This EUA will remain in effect (meaning this test can be used) for the duration of the COVID-19 declaration under Section 564(b)(1) of the Act, 21 U.S.C. section 360bbb-3(b)(1), unless the authorization is terminated or revoked sooner.  Performed at [facility], [place] [address] [place], [place] [address]      Labs: BNP (last 3 results) No results for input(s): BNP in the last 8760 hours. Basic Metabolic Panel: Recent Labs  Lab 07/01/20 0612  NA 142  K 3.8  CL 105  CO2 27  GLUCOSE 139*  BUN 20  CREATININE 0.77  CALCIUM 10.2   Liver Function Tests: No results for input(s): AST, ALT, ALKPHOS, BILITOT, PROT, ALBUMIN in the last 168 hours. No results for input(s): LIPASE, AMYLASE in the last 168 hours. No results for input(s): AMMONIA in the last 168 hours. CBC: Recent Labs  Lab 07/01/20 0612  WBC 4.9  HGB 13.6  HCT 42.9  MCV 92.1  PLT 188   Cardiac Enzymes: No results for input(s): CKTOTAL, CKMB, CKMBINDEX, TROPONINI in the last 168 hours. BNP: Invalid input(s): POCBNP CBG: No results for input(s): GLUCAP in the last 168 hours. D-Dimer No results for input(s): DDIMER in the last 72 hours. Hgb A1c Recent Labs    07/02/20 0500  HGBA1C 6.1*   Lipid Profile Recent Labs    07/02/20 0500  CHOL 148  HDL 57  LDLCALC 75   TRIG 82  CHOLHDL 2.6   Thyroid function studies No results for input(s): TSH, T4TOTAL, T3FREE, THYROIDAB in the last 72 hours.  Invalid input(s): FREET3 Anemia work up No results for input(s): VITAMINB12, FOLATE, FERRITIN, TIBC, IRON, RETICCTPCT in the last 72 hours. Urinalysis    Component Value Date/Time   COLORURINE STRAW (A) 07/01/2020 0553   APPEARANCEUR CLEAR 07/01/2020 0553   LABSPEC 1.008 07/01/2020 0553   PHURINE 6.0 07/01/2020 0553   GLUCOSEU NEGATIVE 07/01/2020 0553   HGBUR NEGATIVE 07/01/2020 0553   BILIRUBINUR NEGATIVE 07/01/2020 0553   KETONESUR NEGATIVE 07/01/2020 0553   PROTEINUR NEGATIVE 07/01/2020 0553   NITRITE NEGATIVE 07/01/2020 0553   LEUKOCYTESUR NEGATIVE 07/01/2020 0553   Sepsis Labs Invalid input(s): PROCALCITONIN,  WBC,  LACTICIDVEN Microbiology Recent Results (from the past 240 hour(s))  SARS Coronavirus 2 by RT PCR (hospital order, performed in [facility] lab) Nasopharyngeal Nasopharyngeal Swab     Status: None   Collection Time: 07/01/20  9:58 PM  Specimen: Nasopharyngeal Swab  Result Value Ref Range Status   SARS Coronavirus 2 NEGATIVE NEGATIVE Final    Comment: (NOTE) SARS-CoV-2 target nucleic acids are NOT DETECTED.  The SARS-CoV-2 RNA is generally detectable in upper and lower respiratory specimens during the acute phase of infection. The lowest concentration of SARS-CoV-2 viral copies this assay can detect is 250 copies / mL. A negative result does not preclude SARS-CoV-2 infection and should not be used as the sole basis for treatment or other patient management decisions.  A negative result may occur with improper specimen collection / handling, submission of specimen other than nasopharyngeal swab, presence of viral mutation(s) within the areas targeted by this assay, and inadequate number of viral copies (<250 copies / mL). A negative result must be combined with clinical observations, patient history, and  epidemiological information.  Fact Sheet for Patients:   StrictlyIdeas.no  Fact Sheet for Healthcare Providers: BankingDealers.co.za  This test is not yet approved or  cleared by the [place] FDA and has been authorized for detection and/or diagnosis of SARS-CoV-2 by FDA under an Emergency Use Authorization (EUA).  This EUA will remain in effect (meaning this test can be used) for the duration of the COVID-19 declaration under Section 564(b)(1) of the Act, 21 U.S.C. section 360bbb-3(b)(1), unless the authorization is terminated or revoked sooner.  Performed at [facility], [place] [address] [place], [place] [address]      Time coordinating discharge: Over 35 minutes  SIGNED:   Hosie Poisson, MD  [facility] 07/04/2020, 10:37 AM

## 2020-07-04 NOTE — Progress Notes (Signed)
Physical Therapy Treatment Patient Details Name: [name] MRN: [id number] DOB: Jun 21, 1946 Today's Date: 07/04/2020    History of Present Illness Pt is a 74 y.o. 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  Incidental finding of small L frontal lobe possible infarct.     PT Comments    Patient received in bathroom with NT. Agreeable to PT session. She requires AFO on left foot for ambulation. Able to don shoes/AFO independently with set up assist. She transfers sit to stand with min assist. Ambulated with RW mostly and last 20 feet with single hand held assist for a total of 150 feet. Slow pace, difficulty staying inside RW especially with turning requiring cues and min guard. Short step on left with decreased foot clearance. She will continue to benefit from skilled PT while here to improve functional independence and safety with mobility.     Follow Up Recommendations  CIR     Equipment Recommendations  None recommended by PT    Recommendations for Other Services Rehab consult     Precautions / Restrictions Precautions Precautions: Fall Precaution Comments: mod fall Required Braces or Orthoses: Other Brace Other Brace: L AFO Restrictions Weight Bearing Restrictions: No    Mobility  Bed Mobility               General bed mobility comments: Patient received up in bathroom with NT upon arrival  Transfers Overall transfer level: Needs assistance   Transfers: Sit to/from Stand Sit to Stand: Min assist         General transfer comment: min assist to power up and steady from EOB, increased time and effort with cueing for hand placement and safety   Ambulation/Gait Ambulation/Gait assistance: Min guard Gait Distance (Feet): 150 Feet Assistive device: Rolling walker (2 wheeled);1 person hand held  assist Gait Pattern/deviations: Step-through pattern;Decreased step length - left;Decreased step length - right;Decreased stance time - left;Decreased stride length;Shuffle;Trunk flexed Gait velocity: decr   General Gait Details: Pt's gait is spastic and paretic in nature with and IR/ER waggle with each step.  No toe off and flat foot contact. She is more steady with the RW, but her left foot occasionally hits the RW.  Ambulated the last 20 feet with hand held assist and she is less stable and unsure of herself.   Stairs             Wheelchair Mobility    Modified Rankin (Stroke Patients Only) Modified Rankin (Stroke Patients Only) Pre-Morbid Rankin Score: Slight disability Modified Rankin: Moderately severe disability     Balance Overall balance assessment: Needs assistance Sitting-balance support: No upper extremity supported;Feet supported Sitting balance-Leahy Scale: Good Sitting balance - Comments: able to reach outside BOS without lob in sitting   Standing balance support: Bilateral upper extremity supported;During functional activity Standing balance-Leahy Scale: Fair Standing balance comment: Reliant on B UE support with dynamic standing. Unsure of herself and less steady with single UE support.                            Cognition Arousal/Alertness: Awake/alert Behavior During Therapy: WFL for tasks assessed/performed Overall Cognitive Status: Within Functional Limits for tasks assessed  Exercises      General Comments        Pertinent Vitals/Pain Pain Assessment: No/denies pain    Home Living                      Prior Function            PT Goals (current goals can now be found in the care plan section) Acute Rehab PT Goals Patient Stated Goal: to walk better  PT Goal Formulation: With patient Time For Goal Achievement: 07/17/20 Potential to Achieve Goals: Good Progress  towards PT goals: Progressing toward goals    Frequency    Min 4X/week      PT Plan Current plan remains appropriate    Co-evaluation              AM-PAC PT "6 Clicks" Mobility   Outcome Measure  Help needed turning from your back to your side while in a flat bed without using bedrails?: None Help needed moving from lying on your back to sitting on the side of a flat bed without using bedrails?: A Little Help needed moving to and from a bed to a chair (including a wheelchair)?: A Little Help needed standing up from a chair using your arms (e.g., wheelchair or bedside chair)?: A Little Help needed to walk in hospital room?: A Little Help needed climbing 3-5 steps with a railing? : A Little 6 Click Score: 19    End of Session Equipment Utilized During Treatment: Gait belt Activity Tolerance: Patient tolerated treatment well Patient left: in chair;with call bell/phone within reach;with family/visitor present Nurse Communication: Mobility status PT Visit Diagnosis: Other abnormalities of gait and mobility (R26.89);Other symptoms and signs involving the nervous system (R29.898);Difficulty in walking, not elsewhere classified (R26.2)     Time: 8588-5027 PT Time Calculation (min) (ACUTE ONLY): 23 min  Charges:  $Gait Training: 23-37 mins                     [employer], PT, GCS 07/04/20,12:01 PM

## 2020-07-04 NOTE — Progress Notes (Signed)
ANTICOAGULATION CONSULT NOTE - Follow-Up Consult  Pharmacy Consult for Warfarin Indication: mechanical MVR  Allergies  Allergen Reactions   Zoloft [Sertraline] Hives, Itching and Rash    Patient Measurements: Height: 5\' 7"  (170.2 cm) Weight: 60.3 kg (133 lb) IBW/kg (Calculated) : 61.6  Vital Signs: Temp: 97.9 F (36.6 C) (08/27 0700) Temp Source: Oral (08/27 0700) BP: 135/76 (08/27 0700) Pulse Rate: 75 (08/27 0700)  Labs: Recent Labs    07/02/20 0433 07/03/20 0352 07/04/20 0343  LABPROT 29.1* 25.6* 30.6*  INR 2.9* 2.4* 3.1*    Estimated Creatinine Clearance: 58.7 mL/min (by C-G formula based on SCr of 0.77 mg/dL).   Medical History: Past Medical History:  Diagnosis Date   Abnormality of gait 09/07/2016   Allergy    Diabetes mellitus without complication [place])    Patient denies this - notes history of glucose intolerance   GERD (gastroesophageal reflux disease)    Hemiparesis and alteration of sensations as late effects of stroke ([place]) 09/07/2016   History of pneumonia 1997   Hypertension    S/P MVR (mitral valve replacement)    Mechanical mitral valve replacement at age 80 (done in [place])  // echo 7/17: EF 55-60, normal wall motion, bileaflet mechanical mitral valve prosthesis functioning normally, mild LAE, mildly reduced RVSF, small pericardial effusion   Stroke ([place]) 1997, 2013, 2015    Medications:  See electronic med rec  Assessment: 74 y.o. presents with dizziness, now with acute ischemic stroke Pt on warfarin PTA for mechanical MVR and h/o hemispheric strokes. INR 8/23 0000 was therapeutic (3.2). Admission INR pending. CBC ok on admission. Neurology agrees with continuing coumadin due benefits for heart valve outweigh due to low risk for hemorrhagic transformation with small stroke size.  Home dose: Warfarin 5mg  except for 7.5mg  Mon and Fri  INR is back up to therapeutic range today at 3.1 after 2 doses of 7.5mg . We will give the lower  daily dose today and likely will be able to resume home dose tomorrow. We will also dc Lovenox.   Goal of Therapy:  INR goal 3-3.5 Monitor platelets by anticoagulation protocol: Yes   Plan:  - Warfarin 5 mg x 1 - Daily PT/INR, CBC q72h  Dc Lovenox  Onnie Boer, PharmD, [place], AAHIVP, CPP Infectious Disease Pharmacist 07/04/2020 9:45 AM

## 2020-07-05 ENCOUNTER — Inpatient Hospital Stay (HOSPITAL_COMMUNITY): Payer: Medicare Other | Admitting: Occupational Therapy

## 2020-07-05 ENCOUNTER — Inpatient Hospital Stay (HOSPITAL_COMMUNITY): Payer: Medicare Other | Admitting: Physical Therapy

## 2020-07-05 DIAGNOSIS — I639 Cerebral infarction, unspecified: Secondary | ICD-10-CM

## 2020-07-05 LAB — PROTIME-INR
INR: 2.8 — ABNORMAL HIGH (ref 0.8–1.2)
Prothrombin Time: 28.6 seconds — ABNORMAL HIGH (ref 11.4–15.2)

## 2020-07-05 MED ORDER — WARFARIN SODIUM 7.5 MG PO TABS
7.5000 mg | ORAL_TABLET | Freq: Once | ORAL | Status: AC
  Administered 2020-07-05: 7.5 mg via ORAL
  Filled 2020-07-05: qty 1

## 2020-07-05 NOTE — Evaluation (Signed)
Occupational Therapy Assessment and Plan  Patient Details  Name: [name] MRN: [id number] Date of Birth: [birth year]  OT Diagnosis: abnormal posture, hemiplegia affecting non-dominant side and muscle weakness (generalized) Rehab Potential: Rehab Potential (ACUTE ONLY): Excellent ELOS: 7-9 days   Today's Date: 07/05/2020 OT Individual Time: 3817-7116 OT Individual Time Calculation (min): 77 min     Hospital Problem: Principal Problem:   Subcortical infarction [facility])   Past Medical History:  Past Medical History:  Diagnosis Date  . Abnormality of gait 09/07/2016  . Allergy   . Diabetes mellitus without complication [facility])    Patient denies this - notes history of glucose intolerance  . GERD (gastroesophageal reflux disease)   . Hemiparesis and alteration of sensations as late effects of stroke ([place]) 09/07/2016  . History of pneumonia 1997  . Hypertension   . S/P MVR (mitral valve replacement)    Mechanical mitral valve replacement at age 52 (done in [place])  // echo 7/17: EF 55-60, normal wall motion, bileaflet mechanical mitral valve prosthesis functioning normally, mild LAE, mildly reduced RVSF, small pericardial effusion  . Stroke [facility]) 1997, 2013, 2015   Past Surgical History:  Past Surgical History:  Procedure Laterality Date  . ABDOMINAL HYSTERECTOMY    . BRAIN SURGERY    . BUNIONECTOMY  1993  . CARDIAC VALVE REPLACEMENT  1997  . TOE SURGERY  1996    Assessment & Plan Clinical Impression: Patient is a 74 y.o. year old female with recent admission to the hospital on 07/01/2020 with dizziness and weakness x3 days.  MRI showed single small cortical subcortical ischemic nonhemorrhagic infarct involving the parasagittal posterior left frontal lobe.  Underlying moderate to advanced age-related cerebral atrophy with chronic microvascular ischemic disease with multiple remote lacunar infarcts involving the left thalamus and bilateral cerebellar hemispheres. .  Patient  transferred to CIR on 07/04/2020 .    Patient currently requires min with basic self-care skills secondary to muscle weakness and muscle paralysis, impaired timing and sequencing, abnormal tone, unbalanced muscle activation and decreased coordination and decreased standing balance, decreased postural control, hemiplegia and decreased balance strategies.  Prior to hospitalization, patient could complete ADLs with modified independent .  Patient will benefit from skilled intervention to decrease level of assist with basic self-care skills and increase independence with basic self-care skills prior to discharge home with care partner.  Anticipate patient will require 24 hour supervision and follow up outpatient.  OT - End of Session Activity Tolerance: Tolerates 10 - 20 min activity with multiple rests Endurance Deficit: Yes OT Assessment Rehab Potential (ACUTE ONLY): Excellent OT Patient demonstrates impairments in the following area(s): Balance;Motor;Endurance OT Basic ADL's Functional Problem(s): Grooming;Bathing;Dressing;Toileting OT Advanced ADL's Functional Problem(s): Simple Meal Preparation OT Transfers Functional Problem(s): Toilet OT Additional Impairment(s): Fuctional Use of Upper Extremity OT Plan OT Intensity: Minimum of 1-2 x/day, 45 to 90 minutes OT Frequency: 5 out of 7 days OT Duration/Estimated Length of Stay: 7-9 days OT Treatment/Interventions: Balance/vestibular training;Community reintegration;Disease Lawyer;Neuromuscular re-education;Functional mobility training;Functional electrical stimulation;Discharge planning;Pain management;Self Care/advanced ADL retraining;Therapeutic Activities;UE/LE Coordination activities;Therapeutic Exercise;Patient/family education;UE/LE Strength taining/ROM OT Self Feeding Anticipated Outcome(s): modified independent OT Basic Self-Care Anticipated Outcome(s): supervision OT Toileting Anticipated  Outcome(s): supervision OT Bathroom Transfers Anticipated Outcome(s): supervision OT Recommendation Patient destination: Home Follow Up Recommendations: 24 hour supervision/assistance Equipment Recommended: None recommended by OT   OT Evaluation Precautions/Restrictions  Precautions Precautions: Fall Precaution Comments: LUE and LLE hemiparesis Required Braces or Orthoses: Other Brace Other Brace: L AFO Restrictions Weight Bearing  Restrictions: No  Pain  No report of pain Home Living/Prior Functioning Home Living Family/patient expects to be discharged to:: Private residence Living Arrangements: Spouse/significant other Available Help at Discharge: Family, Industrial/product designer, Available 24 hours/day Type of Home: House Home Access: Level entry Home Layout: Able to live on main level with bedroom/bathroom, Two level Bathroom Shower/Tub: Walk-in shower (pt sponge bathes) Bathroom Toilet: Standard Bathroom Accessibility: Yes  Lives With: Spouse IADL History Homemaking Responsibilities: Yes Meal Prep Responsibility: Secondary Laundry Responsibility: Secondary Cleaning Responsibility: Secondary Current License: Yes Occupation: Retired Prior Function Level of Independence: Independent with gait, Independent with transfers, Needs assistance with homemaking, Requires assistive device for independence Driving: No (pt reports she was working toward driving but had not yet returned) Comments: reports doesn't use AD inside home and uses cane for all outdoor or community mobility; would use husband's arm for support to step-up curb in community; 2 ladies come 2xmonth for vacuuming and deep floor cleaning but pt does all daily cleaning/light cooking/laundry tasks - denies any falls since prior CIR stay Vision Baseline Vision/History: Wears glasses;Cataracts Wears Glasses: At all times Patient Visual Report: No change from baseline Vision Assessment?: Yes Eye Alignment: Within Functional  Limits Ocular Range of Motion: Within Functional Limits Alignment/Gaze Preference: Within Defined Limits Tracking/Visual Pursuits: Decreased smoothness of vertical tracking;Decreased smoothness of horizontal tracking;Other (comment) (noted slight resting nystagmus) Convergence: Impaired (comment) Visual Fields: No apparent deficits Perception  Perception: Within Functional Limits Praxis Praxis: Intact Cognition Overall Cognitive Status: Within Functional Limits for tasks assessed Arousal/Alertness: Awake/alert Orientation Level: Person;Place;Situation Person: Oriented Place: Oriented Situation: Oriented Year: 2021 Month: August Day of Week: Correct Memory: Appears intact Immediate Memory Recall: Sock;Blue;Bed Memory Recall Sock: Without Cue Memory Recall Blue: Without Cue Memory Recall Bed: Without Cue Attention: Sustained;Selective Sustained Attention: Appears intact Selective Attention: Appears intact Awareness: Appears intact Problem Solving: Appears intact Safety/Judgment: Appears intact Sensation Sensation Light Touch: Impaired Detail Light Touch Impaired Details: Impaired LUE Hot/Cold: Impaired Detail Hot/Cold Impaired Details: Impaired LUE Proprioception: Not tested Coordination Gross Motor Movements are Fluid and Coordinated: No Fine Motor Movements are Fluid and Coordinated: No Coordination and Movement Description: Pt with Brunnstrum stage V in the left arm and stage V in the hand. She demonstrates decreased use of the LUE during selfcare tasks as a learned behavior, but when integrated she is able to use it at a diminshed level. Motor  Motor Motor: Hemiplegia Motor - Skilled Clinical Observations: Mild left hemiparesis  Trunk/Postural Assessment  Cervical Assessment Cervical Assessment: Exceptions to [facility] (cervical protraction) Thoracic Assessment Thoracic Assessment: Exceptions to [facility] (thoracic rounding) Lumbar Assessment Lumbar Assessment: Exceptions to  [facility] (posterior pelvic tilt) Postural Control Postural Control: Deficits on evaluation Protective Responses: Posterior LOB  Balance Balance Balance Assessed: Yes Static Sitting Balance Static Sitting - Balance Support: Feet supported Static Sitting - Level of Assistance: 5: Stand by assistance Dynamic Sitting Balance Dynamic Sitting - Balance Support: Feet supported Dynamic Sitting - Level of Assistance: 5: Stand by assistance Static Standing Balance Static Standing - Balance Support: During functional activity Static Standing - Level of Assistance: 4: Min assist Dynamic Standing Balance Dynamic Standing - Balance Support: During functional activity Dynamic Standing - Level of Assistance: 3: Mod assist Extremity/Trunk Assessment RUE Assessment RUE Assessment: Within Functional Limits LUE Assessment LUE Assessment: Exceptions to [facility] General Strength Comments: AROM shoulder flexion 0-110 degrees, all other joints AROM WFLS with slight increased tone noted in the biceps.  Decreased learned use noted with decreased gross and FM coordination as well  as decreased speed and accuracy with finger to nose testing.  Care Tool Care Tool Self Care Eating   Eating Assist Level: Set up assist    Oral Care    Oral Care Assist Level: Supervision/Verbal cueing    Bathing   Body parts bathed by patient: Left arm;Chest;Right arm;Left upper leg;Right lower leg;Left lower leg;Abdomen;Front perineal area;Face;Buttocks;Right upper leg     Assist Level: Minimal Assistance - Patient > 75%    Upper Body Dressing(including orthotics)   What is the patient wearing?: Pull over shirt   Assist Level: Set up assist    Lower Body Dressing (excluding footwear)   What is the patient wearing?: Underwear/pull up;Pants Assist for lower body dressing: Minimal Assistance - Patient > 75%    Putting on/Taking off footwear   What is the patient wearing?: Socks;Shoes;AFO Assist for footwear: Supervision/Verbal  cueing       Care Tool Toileting Toileting activity   Assist for toileting: Minimal Assistance - Patient > 75%     Care Tool Bed Mobility Roll left and right activity        Sit to lying activity        Lying to sitting edge of bed activity         Care Tool Transfers Sit to stand transfer   Sit to stand assist level: Minimal Assistance - Patient > 75%    Chair/bed transfer   Chair/bed transfer assist level: Moderate Assistance - Patient 50 - 74% (no device or AFO)     Toilet transfer   Assist Level: Moderate Assistance - Patient 50 - 74% (No device)     Care Tool Cognition Expression of Ideas and Wants Expression of Ideas and Wants: Without difficulty (complex and basic) - expresses complex messages without difficulty and with speech that is clear and easy to understand   Understanding Verbal and Non-Verbal Content Understanding Verbal and Non-Verbal Content: Understands (complex and basic) - clear comprehension without cues or repetitions   Memory/Recall Ability *first 3 days only Memory/Recall Ability *first 3 days only: Current season;Location of own room;Staff names and faces;That he or she is in a hospital/hospital unit    Refer to Care Plan for Long Term Goals  SHORT TERM GOAL WEEK 1 OT Short Term Goal 1 (Week 1): STGs equal to LTGS set at supervision level overall.  Recommendations for other services: None    Skilled Therapeutic Intervention ADL ADL Eating: Set up Where Assessed-Eating: Edge of bed Grooming: Supervision/safety Where Assessed-Grooming: Wheelchair Upper Body Bathing: Supervision/safety Where Assessed-Upper Body Bathing: Wheelchair Lower Body Bathing: Minimal assistance Where Assessed-Lower Body Bathing: Wheelchair Upper Body Dressing: Setup Where Assessed-Upper Body Dressing: Wheelchair Lower Body Dressing: Minimal assistance Where Assessed-Lower Body Dressing: Sitting at sink;Standing at sink Toileting: Minimal assistance Toilet  Transfer: Minimal assistance Toilet Transfer Method: Stand pivot Toilet Transfer Equipment: Bedside commode Mobility  Transfers Sit to Stand: Minimal Assistance - Patient > 75% Stand to Sit: Minimal Assistance - Patient > 75%  Pt worked on selfcare retraining sit to stand at the sink.  Decreased attempted functional use of the LUE noted with pt trying to wash her right under arm area with use of the right hand.  She also did not attempt functional use when squeezing out the washcloth, and instead used just the RUE.  She did integrate it more for dressing tasks and could tie her shoes.  Discussed this and pt seems to report that it is weaker than her baseline since the CVA in 2020,   however she also reports more learned non-use as well.  Min assist for sit to stand with mod assist for functional mobility without an assistive device.  Decreased efficiency with advancement of the LLE with adequate step length with or without use of the RW.  Discussed expectations of LOS as well as supervision level goals.  Pt is in agreement with this plan.    Discharge Criteria: Patient will be discharged from OT if patient refuses treatment 3 consecutive times without medical reason, if treatment goals not met, if there is a change in medical status, if patient makes no progress towards goals or if patient is discharged from hospital.  The above assessment, treatment plan, treatment alternatives and goals were discussed and mutually agreed upon: by patient  , OTR/L 07/05/2020, 4:30 PM  

## 2020-07-05 NOTE — Plan of Care (Signed)
  Problem: Consults Goal: RH STROKE PATIENT EDUCATION Description: See Patient Education module for education specifics. < Outcome: Progressing   Problem: RH SKIN INTEGRITY Goal: RH STG SKIN FREE OF INFECTION/BREAKDOWN Description: Skin will remain free of breakdown and infection while on rehab with min assist Outcome: Progressing   Problem: RH SAFETY Goal: RH STG ADHERE TO SAFETY PRECAUTIONS W/ASSISTANCE/DEVICE Description: STG Adhere to Safety Precautions With Assistance/Device. min Outcome: Progressing   Problem: RH PAIN MANAGEMENT Goal: RH STG PAIN MANAGED AT OR BELOW PT'S PAIN GOAL Description: Less than 4 Outcome: Progressing

## 2020-07-05 NOTE — Evaluation (Signed)
Physical Therapy Assessment and Plan  Patient Details  Name: [name] MRN: [id number] Date of Birth: [birth year]  PT Diagnosis: Abnormal posture, Abnormality of gait, Ataxic gait, Coordination disorder, Difficulty walking, Hemiparesis non-dominant, Impaired sensation and Muscle weakness Rehab Potential: Good ELOS: 7-10 days   Today's Date: 07/05/2020 PT Individual Time: 1010-1108 PT Individual Time Calculation (min): 58 min    Hospital Problem: Principal Problem:   Subcortical infarction [facility])   Past Medical History:  Past Medical History:  Diagnosis Date  . Abnormality of gait 09/07/2016  . Allergy   . Diabetes mellitus without complication [facility])    Patient denies this - notes history of glucose intolerance  . GERD (gastroesophageal reflux disease)   . Hemiparesis and alteration of sensations as late effects of stroke ([place]) 09/07/2016  . History of pneumonia 1997  . Hypertension   . S/P MVR (mitral valve replacement)    Mechanical mitral valve replacement at age 31 (done in [place])  // echo 7/17: EF 55-60, normal wall motion, bileaflet mechanical mitral valve prosthesis functioning normally, mild LAE, mildly reduced RVSF, small pericardial effusion  . Stroke [facility]) 1997, 2013, 2015   Past Surgical History:  Past Surgical History:  Procedure Laterality Date  . ABDOMINAL HYSTERECTOMY    . BRAIN SURGERY    . BUNIONECTOMY  1993  . CARDIAC VALVE REPLACEMENT  1997  . TOE SURGERY  1996    Assessment & Plan Clinical Impression: Patient is a 74 y.o. 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  Per chart review lives with spouse.  Two-level home bed and bath main level.  Independent with light ADLs.  She uses a cane for ambulation as well as left AFO.  She  has a son in [place].  Presented 07/01/2020 with dizziness and weakness x3 days.  MRI showed single small cortical subcortical ischemic nonhemorrhagic infarct involving the parasagittal posterior left frontal lobe.  Underlying moderate to advanced age-related cerebral atrophy with chronic microvascular ischemic disease with multiple remote lacunar infarcts involving the left thalamus and bilateral cerebellar hemispheres.  CT angiogram of head and neck with no emergent findings.  Patient did not receive TPA.  Admission chemistries INR 2.9, hemoglobin A1c 6.1, chemistries unremarkable except glucose 139.  Echocardiogram with ejection fraction of 60% no wall motion abnormalities.  Presently maintained on low-dose aspirin as well as ongoing chronic Coumadin with a goal INR of 3.0-3.5.  Tolerating a regular diet.  Therapy evaluations completed and patient was admitted for a comprehensive rehab program. Patient transferred to CIR on 07/04/2020 .   Patient currently requires mod assist with mobility secondary to muscle weakness, decreased cardiorespiratoy endurance, impaired timing and sequencing, unbalanced muscle activation, ataxia and decreased coordination and decreased standing balance, decreased postural control and decreased balance strategies.  Prior to hospitalization, patient was modified independent  with mobility and lived with Spouse in a House home.  Home access is  Level entry.  Patient will benefit from skilled PT intervention to maximize safe functional mobility, minimize fall risk and decrease caregiver burden for planned discharge home with 24 hour supervision.  Anticipate patient will benefit from follow up OP at discharge.  PT - End of Session Activity Tolerance: Tolerates 30+ min activity with multiple rests Endurance Deficit: Yes Endurance Deficit Description: requires seated rest breaks PT Assessment Rehab Potential (ACUTE/IP ONLY): Good PT Patient  demonstrates impairments in the  following area(s): Balance;Safety;Sensory;Edema;Skin Integrity;Endurance;Motor;Nutrition;Pain;Perception;Behavior PT Transfers Functional Problem(s): Bed Mobility;Car;Bed to Chair;Furniture;Floor PT Locomotion Functional Problem(s): Ambulation;Stairs PT Plan PT Intensity: Minimum of 1-2 x/day ,45 to 90 minutes PT Frequency: 5 out of 7 days PT Duration Estimated Length of Stay: 7-10 days PT Treatment/Interventions: Ambulation/gait training;Community reintegration;DME/adaptive equipment instruction;Neuromuscular re-education;Psychosocial support;Stair training;UE/LE Strength taining/ROM;Balance/vestibular training;Discharge planning;Functional electrical stimulation;Pain management;Skin care/wound management;Therapeutic Activities;UE/LE Coordination activities;Cognitive remediation/compensation;Disease management/prevention;Functional mobility training;Patient/family education;Splinting/orthotics;Therapeutic Exercise;Visual/perceptual remediation/compensation PT Transfers Anticipated Outcome(s): supervision using LRAD PT Locomotion Anticipated Outcome(s): supervision using LRAD PT Recommendation Follow Up Recommendations: Outpatient PT;24 hour supervision/assistance Patient destination: Home Equipment Recommended: To be determined   PT Evaluation Precautions/Restrictions Precautions Precautions: Fall;Other (comment) Precaution Comments: LUE and LLE hemiparesis with impaired coordination Required Braces or Orthoses: Other Brace Other Brace: L AFO Restrictions Weight Bearing Restrictions: No Pain Pain Assessment Pain Scale: 0-10 Pain Score: 0-No pain Denies pain just states head has "swimmy feeling" Home Living/Prior Functioning Home Living Available Help at Discharge: Family;Neighbor;Available 24 hours/day Type of Home: House (townhouse) Home Access: Level entry Home Layout: Able to live on main level with bedroom/bathroom;Two level  Lives With: Spouse Nadine Counts) Prior Function Level of  Independence: Independent with gait;Independent with transfers;Needs assistance with homemaking (details below) Driving: No (pt reports she was working toward driving but had not yet returned) Comments: reports doesn't use AD inside home and uses cane for all outdoor or community mobility; would use husband's arm for support to step-up curb in community; 2 ladies come 2xmonth for vacuuming and deep floor cleaning but pt does all daily cleaning/light cooking/laundry tasks - denies any falls since prior CIR stay Perception  Perception Perception: Within Functional Limits Praxis Praxis: Intact  Cognition Overall Cognitive Status: Within Functional Limits for tasks assessed Arousal/Alertness: Awake/alert Orientation Level: Oriented X4 Attention: Sustained;Selective Sustained Attention: Appears intact Selective Attention: Appears intact Memory: Appears intact (Pt able to recall this therapist from prior stay) Awareness: Appears intact Safety/Judgment: Appears intact Sensation Sensation Light Touch: Impaired Detail Light Touch Impaired Details: Impaired LLE Hot/Cold: Not tested Proprioception: Impaired Detail Proprioception Impaired Details: Impaired LLE Stereognosis: Not tested Coordination Gross Motor Movements are Fluid and Coordinated: No Coordination and Movement Description: impaired coordination in L hemibody with impaired proprioception in L LE Motor  Motor Motor: Hemiplegia Motor - Skilled Clinical Observations: Mild left hemiparesis   Trunk/Postural Assessment  Cervical Assessment Cervical Assessment: Exceptions to [facility] (forward head) Thoracic Assessment Thoracic Assessment: Exceptions to [facility] (thoracic kyphosis) Lumbar Assessment Lumbar Assessment: Exceptions to [facility] (posterior pelvic tilt in sitting) Postural Control Postural Control: Deficits on evaluation Protective Responses: Posterior LOB Postural Limitations: decreased  Balance Balance Balance Assessed:  Yes Static Sitting Balance Static Sitting - Balance Support: Feet supported Static Sitting - Level of Assistance: 5: Stand by assistance Dynamic Sitting Balance Dynamic Sitting - Balance Support: Feet supported Dynamic Sitting - Level of Assistance: 5: Stand by assistance Static Standing Balance Static Standing - Balance Support: During functional activity Static Standing - Level of Assistance: 4: Min assist Dynamic Standing Balance Dynamic Standing - Balance Support: During functional activity Dynamic Standing - Level of Assistance: 3: Mod assist Extremity Assessment      RLE Assessment RLE Assessment: Exceptions to [facility] RLE Strength Right Hip Flexion: 4-/5 Right Knee Flexion: 4/5 Right Knee Extension: 4/5 Right Ankle Dorsiflexion: 4/5 Right Ankle Plantar Flexion: 4/5 LLE Assessment LLE Assessment: Exceptions to [facility] General Strength Comments: though has ankle movement pt wears L AFO baseline LLE Strength Left Hip Flexion: 3+/5 Left Knee Flexion: 4-/5  Left Knee Extension: 4-/5 Left Ankle Dorsiflexion: 2/5 (with inversion bias) Left Ankle Plantar Flexion: 2/5  Care Tool Care Tool Bed Mobility Roll left and right activity   Roll left and right assist level: Supervision/Verbal cueing    Sit to lying activity   Sit to lying assist level: Minimal Assistance - Patient > 75%    Lying to sitting edge of bed activity   Lying to sitting edge of bed assist level: Minimal Assistance - Patient > 75%     Care Tool Transfers Sit to stand transfer   Sit to stand assist level: Minimal Assistance - Patient > 75% (no AD)    Chair/bed transfer   Chair/bed transfer assist level: Minimal Assistance - Patient > 75% (stand pivot, no AD, wearing L AFO)     Product manager transfer assist level: Minimal Assistance - Patient > 75%      Care Tool Locomotion Ambulation   Assist level: Minimal Assistance - Patient > 75% Assistive device: No Device Max  distance: 261ft  Walk 10 feet activity   Assist level: Minimal Assistance - Patient > 75% Assistive device: No Device   Walk 50 feet with 2 turns activity   Assist level: Minimal Assistance - Patient > 75% Assistive device: No Device  Walk 150 feet activity   Assist level: Minimal Assistance - Patient > 75% Assistive device: No Device  Walk 10 feet on uneven surfaces activity   Assist level: Minimal Assistance - Patient > 75% Assistive device: Other (comment) (using railing support)  Stairs   Assist level: Moderate Assistance - Patient - 50 - 74% Stairs assistive device: 2 hand rails    Walk up/down 1 step activity   Walk up/down 1 step (curb) assist level: Moderate Assistance - Patient - 50 - 74% Walk up/down 1 step or curb assistive device: 2 hand rails    Walk up/down 4 steps activity Walk up/down 4 steps assist level: Moderate Assistance - Patient - 50 - 74% Walk up/down 4 steps assistive device: 2 hand rails  Walk up/down 12 steps activity Walk up/down 12 steps activity did not occur: Safety/medical concerns (fatigue)      Pick up small objects from floor Pick up small object from the floor (from standing position) activity did not occur: Safety/medical concerns      Wheelchair Will patient use wheelchair at discharge?: No          Wheel 50 feet with 2 turns activity      Wheel 150 feet activity        Refer to Care Plan for Long Term Goals  SHORT TERM GOAL WEEK 1 PT Short Term Goal 1 (Week 1): = to LTGs based on ELOS  Recommendations for other services: None   Skilled Therapeutic Intervention Mobility Bed Mobility Bed Mobility: Sit to Supine;Supine to Sit Supine to Sit: Minimal Assistance - Patient > 75% Sit to Supine: Minimal Assistance - Patient > 75% Transfers Transfers: Sit to Stand;Stand to Sit;Stand Pivot Transfers Sit to Stand: Minimal Assistance - Patient > 75% Stand to Sit: Minimal Assistance - Patient > 75% Stand Pivot Transfers: Minimal  Assistance - Patient > 75% Stand Pivot Transfer Details: Verbal cues for technique;Verbal cues for sequencing;Verbal cues for precautions/safety;Manual facilitation for weight shifting;Tactile cues for posture;Tactile cues for weight shifting;Tactile cues for sequencing;Tactile cues for initiation Transfer (Assistive device): None Locomotion  Gait Ambulation: Yes Gait Assistance: Minimal Assistance - Patient > 75%  Gait Distance (Feet): 226 Feet Assistive device: None Gait Assistance Details: Verbal cues for technique;Verbal cues for gait pattern;Verbal cues for precautions/safety;Manual facilitation for weight shifting;Tactile cues for weight shifting;Tactile cues for sequencing;Tactile cues for initiation;Tactile cues for posture Gait Gait: Yes Gait Pattern: Impaired Gait Pattern: Decreased step length - right;Decreased step length - left;Decreased stance time - left;Decreased stride length;Decreased hip/knee flexion - left;Poor foot clearance - left (wears L AFO baseline) Gait velocity: decreased with guarded trunk Stairs / Additional Locomotion Stairs: Yes Stairs Assistance: Minimal Assistance - Patient > 75%;Moderate Assistance - Patient 50 - 74% Stair Management Technique: Two rails Number of Stairs: 8 Height of Stairs: 6 Ramp: Minimal Assistance - Patient >75% Curb: Moderate Assistance - Patient 50 - 74% Wheelchair Mobility Wheelchair Mobility: No   Evaluation completed (see details above) with education on PT POC and goals and individual treatment initiated with focus on bed mobility, transfers, gait, stair navigation, and pt education regarding daily therapy schedule, weekly team meetings, purpose of PT evaluation, and other CIR information. Pt completed the above mobility tasks with specified assistance levels. Pt reports feeling a "swimmy headed" sensation during session. Pt demos most prominently L LE coordination impairment due to impaired proprioception with an ataxic looking  gait - she also demos decreased trunk rotation with guarded UE posture and wide BOS for increased stability with slight L LE circumduction.  Ascended/descended stairs using B HRs with reciprocal pattern on ascent and step-to pattern leading with L LE on descent with posterior lean requiring up to light mod assist for balance. Therapist provided pt with a different wheelchair and cushion for improved fit to allow increased upright, OOB activity tolerance and improved pressure relief. At end of session pt left seated in w/c with needs in reach and her husband present.  Discharge Criteria: Patient will be discharged from PT if patient refuses treatment 3 consecutive times without medical reason, if treatment goals not met, if there is a change in medical status, if patient makes no progress towards goals or if patient is discharged from hospital.  The above assessment, treatment plan, treatment alternatives and goals were discussed and mutually agreed upon: by patient and by family  Tawana Scale , PT, DPT, CSRS  07/05/2020, 8:05 AM

## 2020-07-05 NOTE — Progress Notes (Signed)
[place] PHYSICAL MEDICINE & REHABILITATION PROGRESS NOTE   Subjective/Complaints: No complaints  Wants to know her INR- 2.8 today- says it fluctuates at home   Objective:   No results found. No results for input(s): WBC, HGB, HCT, PLT in the last 72 hours. No results for input(s): NA, K, CL, CO2, GLUCOSE, BUN, CREATININE, CALCIUM in the last 72 hours.  Intake/Output Summary (Last 24 hours) at 07/05/2020 1255 Last data filed at 07/05/2020 0730 Gross per 24 hour  Intake 380 ml  Output --  Net 380 ml     Physical Exam: Vital Signs Blood pressure 104/78, pulse 95, temperature 97.8 F (36.6 C), temperature source Oral, resp. rate 16, height 5\' 7"  (1.702 m), weight 59.2 kg, SpO2 97 %.  General: Alert and oriented x 3, No apparent distress HEENT: Head is normocephalic, atraumatic, PERRLA, EOMI, sclera anicteric, oral mucosa pink and moist, dentition intact, ext ear canals clear,  Neck: Supple without JVD or lymphadenopathy Heart: Reg rate and rhythm. No murmurs rubs or gallops Chest: CTA bilaterally without wheezes, rales, or rhonchi; no distress Abdomen: Soft, non-tender, non-distended, bowel sounds positive. Extremities: No clubbing, cyanosis, or edema. Pulses are 2+ Skin: Clean and intact without signs of breakdown Neuro:  Patient is alert in no acute distress.  Makes good eye contact with examiner and follows commands.  Fair awareness of deficits. 5/5 strength on right side and 4/5 in left leg.   Psych: Pt's affect is appropriate. Pt is cooperative  Assessment/Plan: 1. Functional deficits secondary to subcortical infarcion which require 3+ hours per day of interdisciplinary therapy in a comprehensive inpatient rehab setting.  Physiatrist is providing close team supervision and 24 hour management of active medical problems listed below.  Physiatrist and rehab team continue to assess barriers to discharge/monitor patient progress toward functional and medical goals  Care  Tool:  Bathing              Bathing assist       Upper Body Dressing/Undressing Upper body dressing   What is the patient wearing?: Hospital gown only    Upper body assist Assist Level: Contact Guard/Touching assist    Lower Body Dressing/Undressing Lower body dressing      What is the patient wearing?: Underwear/pull up     Lower body assist Assist for lower body dressing: Minimal Assistance - Patient > 75%     Toileting Toileting    Toileting assist Assist for toileting: Minimal Assistance - Patient > 75%     Transfers Chair/bed transfer  Transfers assist           Locomotion Ambulation   Ambulation assist              Walk 10 feet activity   Assist           Walk 50 feet activity   Assist           Walk 150 feet activity   Assist           Walk 10 feet on uneven surface  activity   Assist           Wheelchair     Assist               Wheelchair 50 feet with 2 turns activity    Assist            Wheelchair 150 feet activity     Assist          Blood pressure 104/78,  pulse 95, temperature 97.8 F (36.6 C), temperature source Oral, resp. rate 16, height 5\' 7"  (1.702 m), weight 59.2 kg, SpO2 97 %.   Medical Problem List and Plan: 1.  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  Old right parietal encephalomalacia/old left thalamic and bilateral cerebellar lacunar infarcts             -patient may shower             -ELOS/Goals: modI in 6-8 days  -Initial CIR evaluations today 2.  Antithrombotics: -DVT/anticoagulation: Chronic Coumadin for history of MVR.  Goal INR 3.0-3.5. INR is 3.1 on 8/27.  8/28: INR is 2.8             -antiplatelet therapy: Aspirin 81 mg daily 3. Pain Management: Tylenol as needed. Well controled 4. Mood: Advised  emotional support             -antipsychotic agents: N/A 5. Neuropsych: This patient is capable of making decisions on her own behalf. 6. Skin/Wound Care: Routine skin checks. May remove IV 8/27. 7. Fluids/Electrolytes/Nutrition: Routine in and outs with follow-up chemistries 8.  Hypertension.  Norvasc 10 mg daily.  Monitor with increased mobility. Well controlled. Well controlled 9.  Hyperlipidemia.  Lipitor 10.  History of mitral valve replacement.  Continue chronic Coumadin.  No chest pain or shortness of breath 11.  Prediabetes.  Hemoglobin A1c 6.1.  Blood sugar checks discontinued 12.  GERD.  Protonix  LOS: 1 days A FACE TO FACE EVALUATION WAS PERFORMED  Clide Deutscher [name] 07/05/2020, 12:55 PM

## 2020-07-05 NOTE — Progress Notes (Signed)
Occupational Therapy Session Note  Patient Details  Name: [name] MRN: [id number] Date of Birth: 1946/07/30  Today's Date: 07/05/2020 OT Individual Time: 1300-1358 OT Individual Time Calculation (min): 58 min   Skilled Therapeutic Interventions/Progress Updates:    Pt greeted in the w/c with no c/o pain. Requesting to use the restroom. Ambulatory transfer to toilet completed using RW with CGA, vcs for increasing Lt step length. CGA for toileting tasks with pt having continent bladder void. When washing hands at the sink after, she needed vcs to use her Lt hand at dominant level to manage faucet levers and dispense soap. For remainder of session, therapeutic focus was placed on Lt UE NMR. Pt engaged in peg board activity using just the Lt hand to improve coordination and strength. She needed Min A to stack pegs and increased time to meet demands of following basic patterns. Pt rated perceived exertion during activity as "5-6 out of 10." At end of session pt remained sitting in the w/c and left with all needs within reach.   Therapy Documentation Precautions:  Restrictions Weight Bearing Restrictions: No Vital Signs: Therapy Vitals Temp: 97.8 F (36.6 C) Temp Source: Oral Pulse Rate: 79 Resp: 17 BP: 108/81 Patient Position (if appropriate): Sitting Oxygen Therapy SpO2: 96 % O2 Device: Room Air ADL:       Therapy/Group: Individual Therapy  Darold Miley A Rayden Dock 07/05/2020, 3:53 PM

## 2020-07-05 NOTE — Progress Notes (Addendum)
ANTICOAGULATION CONSULT NOTE - Follow-Up Consult  Pharmacy Consult for Warfarin Indication: mechanical MVR  Allergies  Allergen Reactions  . Zoloft [Sertraline] Hives, Itching and Rash    Patient Measurements: Height: 5\' 7"  (170.2 cm) Weight: 59.2 kg (130 lb 8.2 oz) IBW/kg (Calculated) : 61.6  Vital Signs: Temp: 97.8 F (36.6 C) (08/28 0613) Temp Source: Oral (08/28 0613) BP: 104/78 (08/28 7793) Pulse Rate: 95 (08/28 0613)  Labs: Recent Labs    07/03/20 0352 07/04/20 0343 07/05/20 0507  LABPROT 25.6* 30.6* 28.6*  INR 2.4* 3.1* 2.8*    Estimated Creatinine Clearance: 57.7 mL/min (by C-G formula based on SCr of 0.77 mg/dL).   Medical History: Past Medical History:  Diagnosis Date  . Abnormality of gait 09/07/2016  . Allergy   . Diabetes mellitus without complication [facility])    Patient denies this - notes history of glucose intolerance  . GERD (gastroesophageal reflux disease)   . Hemiparesis and alteration of sensations as late effects of stroke ([place]) 09/07/2016  . History of pneumonia 1997  . Hypertension   . S/P MVR (mitral valve replacement)    Mechanical mitral valve replacement at age 56 (done in [place])  // echo 7/17: EF 55-60, normal wall motion, bileaflet mechanical mitral valve prosthesis functioning normally, mild LAE, mildly reduced RVSF, small pericardial effusion  . Stroke [facility]) 1997, 2013, 2015    Medications:  See electronic med rec  Assessment: 74 yo female presents with dizziness, now with acute ischemic stroke. PTA the patient is on warfarin for mechanical MVR and h/o hemispheric strokes. INR 8/23 0000 was therapeutic (3.2). Neurology agrees with continuing warfarin due benefits for heart valve outweigh due to low risk for hemorrhagic transformation with small stroke size. Pharmacy is consulted to dose warfarin. The patient was on enoxaparin but is since discontinued.  The patient's INR is subtherapeutic at 2.8 today. The patient has been eating  more of their meals. There are no contraindicated drug interactions identified. Will continue to monitor INR.   PTA dose: Warfarin PO 5 mg except 7.5 mg Mon and Fri  Goal of Therapy:  INR goal 3-3.5 Monitor platelets by anticoagulation protocol: Yes   Plan:  - Warfarin PO 7.5 mg x1 dose tonight - Obtain daily PT/INR, CBC every 3 days - Monitor for signs and symptoms of bleeding - Monitor drug interactions and oral intake   Shauna Hugh, PharmD, [place]  PGY-1 Pharmacy Resident 07/05/2020 11:35 AM  Please check [email or web address] for unit-specific pharmacy phone numbers.

## 2020-07-06 LAB — PROTIME-INR
INR: 3.2 — ABNORMAL HIGH (ref 0.8–1.2)
Prothrombin Time: 31.6 seconds — ABNORMAL HIGH (ref 11.4–15.2)

## 2020-07-06 MED ORDER — WARFARIN SODIUM 7.5 MG PO TABS
7.5000 mg | ORAL_TABLET | Freq: Once | ORAL | Status: AC
  Administered 2020-07-06: 7.5 mg via ORAL
  Filled 2020-07-06: qty 1

## 2020-07-06 NOTE — Progress Notes (Signed)
ANTICOAGULATION CONSULT NOTE - Follow-Up Consult  Pharmacy Consult for Warfarin Indication: mechanical MVR  Allergies  Allergen Reactions  . Zoloft [Sertraline] Hives, Itching and Rash    Patient Measurements: Height: 5\' 7"  (170.2 cm) Weight: 59.2 kg (130 lb 8.2 oz) IBW/kg (Calculated) : 61.6  Vital Signs: Temp: 97.6 F (36.4 C) (08/29 0415) BP: 113/65 (08/29 0415) Pulse Rate: 66 (08/29 0415)  Labs: Recent Labs    07/04/20 0343 07/05/20 0507 07/06/20 0524  LABPROT 30.6* 28.6* 31.6*  INR 3.1* 2.8* 3.2*    Estimated Creatinine Clearance: 57.7 mL/min (by C-G formula based on SCr of 0.77 mg/dL).   Medical History: Past Medical History:  Diagnosis Date  . Abnormality of gait 09/07/2016  . Allergy   . Diabetes mellitus without complication [facility])    Patient denies this - notes history of glucose intolerance  . GERD (gastroesophageal reflux disease)   . Hemiparesis and alteration of sensations as late effects of stroke ([place]) 09/07/2016  . History of pneumonia 1997  . Hypertension   . S/P MVR (mitral valve replacement)    Mechanical mitral valve replacement at age 74 (done in [place])  // echo 7/17: EF 55-60, normal wall motion, bileaflet mechanical mitral valve prosthesis functioning normally, mild LAE, mildly reduced RVSF, small pericardial effusion  . Stroke [facility]) 1997, 2013, 2015    Medications:  See electronic med rec  Assessment: 74 yo female presents with dizziness, now with acute ischemic stroke. PTA the patient is on warfarin for mechanical MVR and h/o hemispheric strokes. INR 8/23 0000 was therapeutic (3.2). Neurology agrees with continuing warfarin due benefits for heart valve outweigh due to low risk for hemorrhagic transformation with small stroke size. Pharmacy is consulted to dose warfarin. The patient was on enoxaparin but is since discontinued.  The patient's INR is therapeutic at 3.2 today. The patient has been eating more of their meals. There are no  contraindicated drug interactions identified. Will continue to monitor INR.   PTA dose: Warfarin PO 5 mg except 7.5 mg Mon and Fri and patient states that INR fluctuates at home.  Goal of Therapy:  INR goal 3-3.5 Monitor platelets by anticoagulation protocol: Yes   Plan:  - Warfarin PO 7.5 mg x1 dose tonight - Obtain daily PT/INR, CBC on Mondays - Monitor for signs and symptoms of bleeding - Monitor drug interactions and oral intake   Shauna Hugh, PharmD, [place]  PGY-1 Pharmacy Resident 07/06/2020 11:01 AM  Please check [email or web address] for unit-specific pharmacy phone numbers.

## 2020-07-06 NOTE — Progress Notes (Signed)
[place] PHYSICAL MEDICINE & REHABILITATION PROGRESS NOTE   Subjective/Complaints: Asks what her INR is- advised 3.2 and she is very happy No other questions or complaints  ROS: Denies pain, constipation, insomnia  Objective:   No results found. No results for input(s): WBC, HGB, HCT, PLT in the last 72 hours. No results for input(s): NA, K, CL, CO2, GLUCOSE, BUN, CREATININE, CALCIUM in the last 72 hours.  Intake/Output Summary (Last 24 hours) at 07/06/2020 1340 Last data filed at 07/06/2020 0730 Gross per 24 hour  Intake 260 ml  Output --  Net 260 ml     Physical Exam: Vital Signs Blood pressure 110/71, pulse 75, temperature 97.8 F (36.6 C), resp. rate 18, height 5\' 7"  (1.702 m), weight 59.2 kg, SpO2 95 %. General: Alert and oriented x 3, No apparent distress HEENT: Head is normocephalic, atraumatic, PERRLA, EOMI, sclera anicteric, oral mucosa pink and moist, dentition intact, ext ear canals clear,  Neck: Supple without JVD or lymphadenopathy Heart: Reg rate and rhythm. No murmurs rubs or gallops Chest: CTA bilaterally without wheezes, rales, or rhonchi; no distress Abdomen: Soft, non-tender, non-distended, bowel sounds positive. Extremities: No clubbing, cyanosis, or edema. Pulses are 2+ Skin: Clean and intact without signs of breakdown Neuro:  Patient is alert in no acute distress.  Makes good eye contact with examiner and follows commands.  Fair awareness of deficits. 5/5 strength on right side and 4/5 in left leg.   Psych: Pt's affect is appropriate. Pt is cooperative   Assessment/Plan: 1. Functional deficits secondary to subcortical infarcion which require 3+ hours per day of interdisciplinary therapy in a comprehensive inpatient rehab setting.  Physiatrist is providing close team supervision and 24 hour management of active medical problems listed below.  Physiatrist and rehab team continue to assess barriers to discharge/monitor patient progress toward functional  and medical goals  Care Tool:  Bathing    Body parts bathed by patient: Left arm, Chest, Right arm, Left upper leg, Right lower leg, Left lower leg, Abdomen, Front perineal area, Face, Buttocks, Right upper leg         Bathing assist Assist Level: Minimal Assistance - Patient > 75%     Upper Body Dressing/Undressing Upper body dressing   What is the patient wearing?: Hospital gown only    Upper body assist Assist Level: Independent    Lower Body Dressing/Undressing Lower body dressing      What is the patient wearing?: Underwear/pull up     Lower body assist Assist for lower body dressing: Independent     Toileting Toileting    Toileting assist Assist for toileting: Moderate Assistance - Patient 50 - 74%     Transfers Chair/bed transfer  Transfers assist     Chair/bed transfer assist level: Moderate Assistance - Patient 50 - 74%     Locomotion Ambulation   Ambulation assist      Assist level: Minimal Assistance - Patient > 75% Assistive device: No Device Max distance: 212ft   Walk 10 feet activity   Assist     Assist level: Minimal Assistance - Patient > 75% Assistive device: No Device   Walk 50 feet activity   Assist    Assist level: Minimal Assistance - Patient > 75% Assistive device: No Device    Walk 150 feet activity   Assist    Assist level: Minimal Assistance - Patient > 75% Assistive device: No Device    Walk 10 feet on uneven surface  activity   Assist  Assist level: Minimal Assistance - Patient > 75% Assistive device: Other (comment) (using railing support)   Wheelchair     Assist Will patient use wheelchair at discharge?: No             Wheelchair 50 feet with 2 turns activity    Assist            Wheelchair 150 feet activity     Assist          Blood pressure 110/71, pulse 75, temperature 97.8 F (36.6 C), resp. rate 18, height 5\' 7"  (1.702 m), weight 59.2 kg, SpO2 95  %.   Medical Problem List and Plan: 1.  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  Antithrombotics: -DVT/anticoagulation: Chronic Coumadin for history of MVR.  Goal INR 3.0-3.5.  INR is 3.2 on 8/29             -antiplatelet therapy: Aspirin 81 mg daily 3. Pain Management: Tylenol as needed. Well controlled 4. Mood: Advised emotional support             -antipsychotic agents: N/A 5. Neuropsych: This patient is capable of making decisions on her own behalf. 6. Skin/Wound Care: Routine skin checks. May remove IV 8/27. 7. Fluids/Electrolytes/Nutrition: Routine in and outs with follow-up chemistries 8.  Hypertension.  Norvasc 10 mg daily.  Monitor with increased mobility. Well controlled. Well controlled to soft on last 5 reads. Can consider decreasing Norvasc to 5mg .  9.  Hyperlipidemia.  Lipitor 10.  History of mitral valve replacement.  Continue chronic Coumadin.  No chest pain or shortness of breath 11.  Prediabetes.  Hemoglobin A1c 6.1.  Blood sugar checks discontinued 12.  GERD.  Protonix  LOS: 2 days A FACE TO FACE EVALUATION WAS PERFORMED  Jaydin Jalomo P Huntley Knoop 07/06/2020, 1:40 PM

## 2020-07-06 NOTE — Progress Notes (Signed)
Occupational Therapy Session Note  Patient Details  Name: [name] MRN: [id number] Date of Birth: May 14, 1946  Today's Date: 07/06/2020 OT Group Time: 1100-1200 OT Group Time Calculation (min): 60 min  Skilled Therapeutic Interventions/Progress Updates:    Pt engaged in therapeutic w/c level dance group focusing on patient choice, UE/LE strengthening, salience, activity tolerance, and social participation. Pt was guided through various dance-based exercises involving UEs/LEs and trunk. All music was selected by group members. Emphasis placed on Lt NMR and activity tolerance. Pt exhibited high levels of participation during group, spouse [name] also attended. She declined standing but completed active and active assist UE exercises in beat to music with the Lt UE and also active ROM of the Lt LE. At end of session she returned to the room with spouse.    Therapy Documentation Precautions:  Precautions Precautions: Fall, Other (comment) Precaution Comments: LUE and LLE hemiparesis with impaired coordination Required Braces or Orthoses: Other Brace Other Brace: L AFO Restrictions Weight Bearing Restrictions: No Pain: no s/s pain during session   ADL: ADL Eating: Set up Where Assessed-Eating: Edge of bed Grooming: Supervision/safety Where Assessed-Grooming: Wheelchair Upper Body Bathing: Supervision/safety Where Assessed-Upper Body Bathing: Wheelchair Lower Body Bathing: Minimal assistance Where Assessed-Lower Body Bathing: Wheelchair Upper Body Dressing: Setup Where Assessed-Upper Body Dressing: Wheelchair Lower Body Dressing: Minimal assistance Where Assessed-Lower Body Dressing: Sitting at sink, Standing at sink Toileting: Minimal assistance Toilet Transfer: Minimal assistance Toilet Transfer Method: Stand pivot Toilet Transfer Equipment: Bedside commode:     Therapy/Group: Group Therapy  Mathan Darroch A [name] 07/06/2020, 4:20 PM

## 2020-07-06 NOTE — Plan of Care (Signed)
  Problem: Consults Goal: RH STROKE PATIENT EDUCATION Description: See Patient Education module for education specifics. < Outcome: Progressing   Problem: RH SKIN INTEGRITY Goal: RH STG SKIN FREE OF INFECTION/BREAKDOWN Description: Skin will remain free of breakdown and infection while on rehab with min assist Outcome: Progressing   Problem: RH SAFETY Goal: RH STG ADHERE TO SAFETY PRECAUTIONS W/ASSISTANCE/DEVICE Description: STG Adhere to Safety Precautions With Assistance/Device. min Outcome: Progressing   Problem: RH PAIN MANAGEMENT Goal: RH STG PAIN MANAGED AT OR BELOW PT'S PAIN GOAL Description: Less than 4 Outcome: Progressing

## 2020-07-06 NOTE — Discharge Instructions (Addendum)
Inpatient Rehab Discharge Instructions  [name] Discharge date and time: No discharge date for patient encounter.   Activities/Precautions/ Functional Status: Activity: activity as tolerated Diet: regular diet Wound Care: none needed Functional status:  ___ No restrictions     ___ Walk up steps independently ___ 24/7 supervision/assistance   ___ Walk up steps with assistance ___ Intermittent supervision/assistance  ___ Bathe/dress independently ___ Walk with walker     _x__ Bathe/dress with assistance ___ Walk Independently    ___ Shower independently ___ Walk with assistance    ___ Shower with assistance ___ No alcohol     ___ Return to work/school ________ COMMUNITY REFERRALS UPON DISCHARGE:    Outpatient: PT     OT                 Agency: [facility] Phone: [phone]              Appointment Date/Time: Facility to schedule upon discharge  Special Instructions: No driving smoking or alcohol  Home health nurse to check INR on 07/18/2020       results to [place] group  [name] Discharge date and time: No discharge date for patient encounter.   Activities/Precautions/ Functional Status: Activity: activity as tolerated Diet: regular diet Wound Care: none needed  STROKE/TIA DISCHARGE INSTRUCTIONS SMOKING Cigarette smoking nearly doubles your risk of having a stroke & is the single most alterable risk factor  If you smoke or have smoked in the last 12 months, you are advised to quit smoking for your health.  Most of the excess cardiovascular risk related to smoking disappears within a year of stopping.  Ask you doctor about anti-smoking medications  [place] Quit Line: 1-800-QUIT NOW  Free Smoking Cessation Classes [phone]  CHOLESTEROL Know your levels; limit fat & cholesterol in your diet  Lipid Panel     Component Value Date/Time   CHOL 148 07/02/2020 0500   TRIG 82 07/02/2020 0500   HDL 57 07/02/2020 0500   CHOLHDL 2.6  07/02/2020 0500   VLDL 16 07/02/2020 0500   [place] 75 07/02/2020 0500      Many patients benefit from treatment even if their cholesterol is at goal.  Goal: Total Cholesterol (CHOL) less than 160  Goal:  Triglycerides (TRIG) less than 150  Goal:  HDL greater than 40  Goal:  LDL (LDLCALC) less than 100   BLOOD PRESSURE American Stroke Association blood pressure target is less that 120/80 mm/Hg  Your discharge blood pressure is:  BP: 99/70  Monitor your blood pressure  Limit your salt and alcohol intake  Many individuals will require more than one medication for high blood pressure  DIABETES (A1c is a blood sugar average for last 3 months) Goal HGBA1c is under 7% (HBGA1c is blood sugar average for last 3 months)  Diabetes: No known diagnosis of diabetes    Lab Results  Component Value Date   HGBA1C 6.1 (H) 07/02/2020     Your HGBA1c can be lowered with medications, healthy diet, and exercise.  Check your blood sugar as directed by your physician  Call your physician if you experience unexplained or low blood sugars.  PHYSICAL ACTIVITY/REHABILITATION Goal is 30 minutes at least 4 days per week  Activity: Increase activity slowly, Therapies: Physical Therapy: Home Health Return to work:   Activity decreases your risk of heart attack and stroke and makes your heart stronger.  It helps control your weight and blood pressure; helps you  relax and can improve your mood.  Participate in a regular exercise program.  Talk with your doctor about the best form of exercise for you (dancing, walking, swimming, cycling).  DIET/WEIGHT Goal is to maintain a healthy weight  Your discharge diet is:  Diet Order            Diet Heart Room service appropriate? No; Fluid consistency: Thin  Diet effective now                 liquids Your height is:  Height: 5\' 7"  (170.2 cm) Your current weight is: Weight: 59.2 kg Your Body Mass Index (BMI) is:  BMI (Calculated): 20.44  Following the  type of diet specifically designed for you will help prevent another stroke.  Your goal weight range is:    Your goal Body Mass Index (BMI) is 19-24.  Healthy food habits can help reduce 3 risk factors for stroke:  High cholesterol, hypertension, and excess weight.  RESOURCES Stroke/Support Group:  Call [phone]   STROKE EDUCATION PROVIDED/REVIEWED AND GIVEN TO PATIENT Stroke warning signs and symptoms How to activate emergency medical system (call 911). Medications prescribed at discharge. Need for follow-up after discharge. Personal risk factors for stroke. Pneumonia vaccine given:  Flu vaccine given:  My questions have been answered, the writing is legible, and I understand these instructions.  I will adhere to these goals & educational materials that have been provided to me after my discharge from the hospital.    Functional status:  ___ No restrictions     ___ Walk up steps independently ___ 24/7 supervision/assistance   ___ Walk up steps with assistance ___ Intermittent supervision/assistance  ___ Bathe/dress independently ___ Walk with walker     ___ Bathe/dress with assistance ___ Walk Independently    ___ Shower independently ___ Walk with assistance    ___ Shower with assistance ___ No alcohol     ___ Return to work/school ________  Special Instructions:    My questions have been answered and I understand these instructions. I will adhere to these goals and the provided educational materials after my discharge from the hospital.  Patient/Caregiver Signature _______________________________ Date __________  Clinician Signature _______________________________________ Date __________  Please bring this form and your medication list with you to all your follow-up doctor's appointments. [facility] [phone] fax [phone]   My questions have been answered and I understand these instructions. I will adhere to these goals and the provided educational  materials after my discharge from the hospital.  Patient/Caregiver Signature _______________________________ Date __________  Clinician Signature _______________________________________ Date __________  Please bring this form and your medication list with you to all your follow-up doctor's appointments. Information on my medicine - Coumadin   (Warfarin)  This medication education was reviewed with me or my healthcare representative as part of my discharge preparation.  Why was Coumadin prescribed for you? Coumadin was prescribed for you because you have a blood clot or a medical condition that can cause an increased risk of forming blood clots. Blood clots can cause serious health problems by blocking the flow of blood to the heart, lung, or brain. Coumadin can prevent harmful blood clots from forming.  As a reminder your indication for Coumadin is: Stroke prevention due to a previous stroke and heart valve  What test will check on my response to Coumadin? While on Coumadin (warfarin) you will need to have an INR test regularly to ensure that your dose is keeping you in the desired range.  The INR (international normalized ratio) number is calculated from the result of the laboratory test called prothrombin time (PT).  If an INR APPOINTMENT HAS NOT ALREADY BEEN MADE FOR YOU please schedule an appointment to have this lab work done by your health care provider within 7 days. Your INR goal is usually a number between:  2 to 3 or your provider may give you a more narrow range like 2-2.5.  Ask your health care provider during an office visit what your goal INR is. Your INR goal is 3-3.5.  What  do you need to  know  About  COUMADIN? Take Coumadin (warfarin) exactly as prescribed by your healthcare provider about the same time each day.  DO NOT stop taking without talking to the doctor who prescribed the medication.  Stopping without other blood clot prevention medication to take the place of  Coumadin may increase your risk of developing a new clot or stroke.  Get refills before you run out.  What do you do if you miss a dose? If you miss a dose, take it as soon as you remember on the same day then continue your regularly scheduled regimen the next day.  Do not take two doses of Coumadin at the same time.  Important Safety Information A possible side effect of Coumadin (Warfarin) is an increased risk of bleeding. You should call your healthcare provider right away if you experience any of the following: ? Bleeding from an injury or your nose that does not stop. ? Unusual colored urine (red or dark brown) or unusual colored stools (red or black). ? Unusual bruising for unknown reasons. ? A serious fall or if you hit your head (even if there is no bleeding).  Some foods or medicines interact with Coumadin (warfarin) and might alter your response to warfarin. To help avoid this: ? Eat a balanced diet, maintaining a consistent amount of Vitamin K. ? Notify your provider about major diet changes you plan to make. ? Avoid alcohol or limit your intake to 1 drink for women and 2 drinks for men per day. (1 drink is 5 oz. wine, 12 oz. beer, or 1.5 oz. liquor.)  Make sure that ANY health care provider who prescribes medication for you knows that you are taking Coumadin (warfarin).  Also make sure the healthcare provider who is monitoring your Coumadin knows when you have started a new medication including herbals and non-prescription products.  Coumadin (Warfarin)  Major Drug Interactions  Increased Warfarin Effect Decreased Warfarin Effect  Alcohol (large quantities) Antibiotics (esp. Septra/Bactrim, Flagyl, Cipro) Amiodarone (Cordarone) Aspirin (ASA) Cimetidine (Tagamet) Megestrol (Megace) NSAIDs (ibuprofen, naproxen, etc.) Piroxicam (Feldene) Propafenone (Rythmol SR) Propranolol (Inderal) Isoniazid (INH) Posaconazole (Noxafil) Barbiturates (Phenobarbital) Carbamazepine  (Tegretol) Chlordiazepoxide (Librium) Cholestyramine (Questran) Griseofulvin Oral Contraceptives Rifampin Sucralfate (Carafate) Vitamin K   Coumadin (Warfarin) Major Herbal Interactions  Increased Warfarin Effect Decreased Warfarin Effect  Garlic Ginseng Ginkgo biloba Coenzyme Q10 Green tea St. John's wort    Coumadin (Warfarin) FOOD Interactions  Eat a consistent number of servings per week of foods HIGH in Vitamin K (1 serving =  cup)  Collards (cooked, or boiled & drained) Kale (cooked, or boiled & drained) Mustard greens (cooked, or boiled & drained) Parsley *serving size only =  cup Spinach (cooked, or boiled & drained) Swiss chard (cooked, or boiled & drained) Turnip greens (cooked, or boiled & drained)  Eat a consistent number of servings per week of foods MEDIUM-HIGH in Vitamin K (1 serving = 1  cup)  Asparagus (cooked, or boiled & drained) Broccoli (cooked, boiled & drained, or raw & chopped) Brussel sprouts (cooked, or boiled & drained) *serving size only =  cup Lettuce, raw (green leaf, endive, romaine) Spinach, raw Turnip greens, raw & chopped   These websites have more information on Coumadin (warfarin):  FailFactory.se; VeganReport.com.au;

## 2020-07-07 ENCOUNTER — Inpatient Hospital Stay (HOSPITAL_COMMUNITY): Payer: Medicare Other

## 2020-07-07 ENCOUNTER — Inpatient Hospital Stay (HOSPITAL_COMMUNITY): Payer: Medicare Other | Admitting: Occupational Therapy

## 2020-07-07 ENCOUNTER — Inpatient Hospital Stay (HOSPITAL_COMMUNITY): Payer: Medicare Other | Admitting: Speech Pathology

## 2020-07-07 LAB — CBC WITH DIFFERENTIAL/PLATELET
Abs Immature Granulocytes: 0.01 10*3/uL (ref 0.00–0.07)
Basophils Absolute: 0 10*3/uL (ref 0.0–0.1)
Basophils Relative: 1 %
Eosinophils Absolute: 0.1 10*3/uL (ref 0.0–0.5)
Eosinophils Relative: 3 %
HCT: 39.1 % (ref 36.0–46.0)
Hemoglobin: 12.4 g/dL (ref 12.0–15.0)
Immature Granulocytes: 0 %
Lymphocytes Relative: 44 %
Lymphs Abs: 1.4 10*3/uL (ref 0.7–4.0)
MCH: 28.4 pg (ref 26.0–34.0)
MCHC: 31.7 g/dL (ref 30.0–36.0)
MCV: 89.7 fL (ref 80.0–100.0)
Monocytes Absolute: 0.2 10*3/uL (ref 0.1–1.0)
Monocytes Relative: 7 %
Neutro Abs: 1.5 10*3/uL — ABNORMAL LOW (ref 1.7–7.7)
Neutrophils Relative %: 45 %
Platelets: 164 10*3/uL (ref 150–400)
RBC: 4.36 MIL/uL (ref 3.87–5.11)
RDW: 13.2 % (ref 11.5–15.5)
WBC: 3.3 10*3/uL — ABNORMAL LOW (ref 4.0–10.5)
nRBC: 0 % (ref 0.0–0.2)

## 2020-07-07 LAB — COMPREHENSIVE METABOLIC PANEL
ALT: 23 U/L (ref 0–44)
AST: 23 U/L (ref 15–41)
Albumin: 3.4 g/dL — ABNORMAL LOW (ref 3.5–5.0)
Alkaline Phosphatase: 62 U/L (ref 38–126)
Anion gap: 7 (ref 5–15)
BUN: 28 mg/dL — ABNORMAL HIGH (ref 8–23)
CO2: 26 mmol/L (ref 22–32)
Calcium: 9.3 mg/dL (ref 8.9–10.3)
Chloride: 107 mmol/L (ref 98–111)
Creatinine, Ser: 0.8 mg/dL (ref 0.44–1.00)
GFR calc Af Amer: >60 mL/min (ref >60)
GFR calc non Af Amer: >60 mL/min (ref >60)
Glucose, Bld: 118 mg/dL — ABNORMAL HIGH (ref 70–99)
Potassium: 4 mmol/L (ref 3.5–5.1)
Sodium: 140 mmol/L (ref 135–145)
Total Bilirubin: 0.6 mg/dL (ref 0.3–1.2)
Total Protein: 5.7 g/dL — ABNORMAL LOW (ref 6.5–8.1)

## 2020-07-07 LAB — PROTIME-INR
INR: 3.7 — ABNORMAL HIGH (ref 0.8–1.2)
Prothrombin Time: 35.7 seconds — ABNORMAL HIGH (ref 11.4–15.2)

## 2020-07-07 MED ORDER — AMLODIPINE BESYLATE 5 MG PO TABS
5.0000 mg | ORAL_TABLET | Freq: Every day | ORAL | Status: DC
  Administered 2020-07-07 – 2020-07-15 (×9): 5 mg via ORAL
  Filled 2020-07-07 (×9): qty 1

## 2020-07-07 MED ORDER — WARFARIN SODIUM 5 MG PO TABS
5.0000 mg | ORAL_TABLET | Freq: Once | ORAL | Status: AC
  Administered 2020-07-07: 5 mg via ORAL
  Filled 2020-07-07: qty 1

## 2020-07-07 MED ORDER — BLOOD PRESSURE CONTROL BOOK
Freq: Once | Status: AC
  Filled 2020-07-07: qty 1

## 2020-07-07 MED ORDER — WARFARIN SODIUM 4 MG PO TABS: 4.0000 mg | ORAL_TABLET | Freq: Once | ORAL | Status: DC

## 2020-07-07 NOTE — Progress Notes (Signed)
Occupational Therapy Session Note  Patient Details  Name: [name] MRN: [id number] Date of Birth: 1946/06/22  Today's Date: 07/07/2020 OT Individual Time: 2426-8341 OT Individual Time Calculation (min): 72 min   Short Term Goals: Week 1:  OT Short Term Goal 1 (Week 1): STGs equal to LTGS set at supervision level overall.  Skilled Therapeutic Interventions/Progress Updates:    Pt greeted in bed with no c/o pain. ADL needs met. She was agreeable to participate in tx off the unit for a change of scenery. CGA for stand pivot<w/c using the RW with vcs for hand placement during sit<stand. In the atrium area, pt used the RW at ambulatory level with CGA to transfer to a booth in the food court. Increased time and vcs for scooting into booth to increase WB through affected side. While seated, worked on gross and fine motor control of the Lt UE using pennies and dollars. Pt had the most trouble with stacking 10 pennies and with palm>finger translations of pennies, increased time and min cuing required to meet motor demands. She rated perceived exertion at this time as "6-7" out of ten. Vcs for safely scooting out of booth in order to transfer back to the w/c using RW. Pt was then escorted back to her room via w/c. Left her with all needs within reach, spouse at bedside.    Therapy Documentation Precautions:  Precautions Precautions: Fall, Other (comment) Precaution Comments: LUE and LLE hemiparesis with impaired coordination Required Braces or Orthoses: Other Brace Other Brace: L AFO Restrictions Weight Bearing Restrictions: No   ADL: ADL Eating: Set up Where Assessed-Eating: Edge of bed Grooming: Supervision/safety Where Assessed-Grooming: Wheelchair Upper Body Bathing: Supervision/safety Where Assessed-Upper Body Bathing: Wheelchair Lower Body Bathing: Minimal assistance Where Assessed-Lower Body Bathing: Wheelchair Upper Body Dressing: Setup Where Assessed-Upper Body Dressing:  Wheelchair Lower Body Dressing: Minimal assistance Where Assessed-Lower Body Dressing: Sitting at sink, Standing at sink Toileting: Minimal assistance Toilet Transfer: Minimal assistance Toilet Transfer Method: Stand pivot Toilet Transfer Equipment: Bedside commode      Therapy/Group: Individual Therapy  Dot Splinter A Hinley Brimage 07/07/2020, 12:08 PM

## 2020-07-07 NOTE — IPOC Note (Signed)
Overall Plan of Care [facility]) Patient Details Name: [name] MRN: [id number] DOB: 03/22/1946  Admitting Diagnosis: Subcortical infarction [facility])  Hospital Problems: Principal Problem:   Subcortical infarction [facility])     Functional Problem List: Nursing Pain, Bladder, Bowel  PT Balance, Safety, Sensory, Edema, Skin Integrity, Endurance, Motor, Nutrition, Pain, Perception, Behavior  OT Balance, Motor, Endurance  SLP    TR         Basic ADLs: OT Grooming, Bathing, Dressing, Toileting     Advanced  ADLs: OT Simple Meal Preparation     Transfers: PT Bed Mobility, Car, Bed to Chair, [employer], Floor  OT Toilet     Locomotion: PT Ambulation, Stairs     Additional Impairments: OT Fuctional Use of Upper Extremity  SLP        TR      Anticipated Outcomes Item Anticipated Outcome  Self Feeding modified independent  Swallowing      Basic self-care  supervision  Toileting  supervision   Bathroom Transfers supervision  Bowel/Bladder  to remain continent x 2  Transfers  supervision using LRAD  Locomotion  supervision using LRAD  Communication     Cognition     Pain  less than 2 out of 10  Safety/Judgment  to remain free from falls while in rehab   Therapy Plan: PT Intensity: Minimum of 1-2 x/day ,45 to 90 minutes PT Frequency: 5 out of 7 days PT Duration Estimated Length of Stay: 7-10 days OT Intensity: Minimum of 1-2 x/day, 45 to 90 minutes OT Frequency: 5 out of 7 days OT Duration/Estimated Length of Stay: 7-9 days     Due to the current state of emergency, patients may not be receiving their 3-hours of Medicare-mandated therapy.   Team Interventions: Nursing Interventions Patient/Family Education, Disease Management/Prevention, Discharge Planning, Bladder Management, Pain Management, Bowel Management, Medication Management  PT interventions Ambulation/gait training, Community reintegration, DME/adaptive equipment instruction, Neuromuscular  re-education, Psychosocial support, Stair training, UE/LE Strength taining/ROM, Training and development officer, Discharge planning, Functional electrical stimulation, Pain management, Skin care/wound management, Therapeutic Activities, UE/LE Coordination activities, Cognitive remediation/compensation, Disease management/prevention, Functional mobility training, Patient/family education, Splinting/orthotics, Therapeutic Exercise, Visual/perceptual remediation/compensation  OT Interventions Training and development officer, Community reintegration, Disease mangement/prevention, Engineer, drilling, Neuromuscular re-education, Functional mobility training, Functional electrical stimulation, Discharge planning, Pain management, Self Care/advanced ADL retraining, Therapeutic Activities, UE/LE Coordination activities, Therapeutic Exercise, Patient/family education, UE/LE Strength taining/ROM  SLP Interventions    TR Interventions    SW/CM Interventions Discharge Planning, Psychosocial Support, Patient/Family Education   Barriers to Discharge MD  Medical stability  Nursing      PT      OT      SLP      SW       Team Discharge Planning: Destination: PT-Home ,OT- Home , SLP-  Projected Follow-up: PT-Outpatient PT, 24 hour supervision/assistance, OT-  24 hour supervision/assistance, SLP-  Projected Equipment Needs: PT-To be determined, OT- None recommended by OT, SLP-  Equipment Details: PT- , OT-  Patient/family involved in discharge planning: PT- Patient, Family member/caregiver,  OT-Patient, SLP-   MD ELOS: 7-10d Medical Rehab Prognosis:  Excellent Assessment:  74 year old right-handed female with history of prediabetes, history of craniotomy, hypertension, mitral valve replacement maintained on chronic Coumadin, 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  Per chart review lives with spouse.  Two-level home bed and  bath main level.  Independent with light ADLs.  She uses a cane for ambulation as well as  left AFO.  She has a son in [place].  Presented 07/01/2020 with dizziness and weakness x3 days.  MRI showed single small cortical subcortical ischemic nonhemorrhagic infarct involving the parasagittal posterior left frontal lobe.  Underlying moderate to advanced age-related cerebral atrophy with chronic microvascular ischemic disease with multiple remote lacunar infarcts involving the left thalamus and bilateral cerebellar hemispheres.  CT angiogram of head and neck with no emergent findings.  Patient did not receive TPA.  Admission chemistries INR 2.9, hemoglobin A1c 6.1, chemistries unremarkable except glucose 139.  Echocardiogram with ejection fraction of 60% no wall motion abnormalities.  Presently maintained on low-dose aspirin as well as ongoing chronic Coumadin with a goal INR of 3.0-3.5.     Now requiring 24/7 Rehab RN,MD, as well as CIR level PT, OT and SLP.  Treatment team will focus on ADLs and mobility with goals set at Supervision  See Team Conference Notes for weekly updates to the plan of care

## 2020-07-07 NOTE — Progress Notes (Signed)
Physical Therapy Session Note  Patient Details  Name: [name] MRN: [id number] Date of Birth: [birth year]  Today's Date: 07/07/2020 PT Individual Time: 6015-6153 PT Individual Time Calculation (min): 68 min   Short Term Goals: Week 1:  PT Short Term Goal 1 (Week 1): = to LTGs based on ELOS  Skilled Therapeutic Interventions/Progress Updates:    Patient received sitting up in wc, agreeable to PT. She denies pain at this time. PT propelled patient to therapy gym for time management. Patient able to ambulate 42ft c FWW + CGA. Ataxic + abducted L LE noted. Patient with L AFO on as well. She notes increased ataxia after turns. PT adding 2.5# weight to L LE to minimize effects of ataxia with moderate success. Patient completing standing cone taps in // bars with SBA + 2.5# weight on ankle. Minimal ataxia noted. Increased ataxia when patient completed task with U UE support. Patient completing standing L LE D1 PNF onto 4" box with targets as visual cues + mirror for feedback. 2.5# ankle weight on. Increased difficulty with coordinating hip extension portion of task compared to flexion. Good carryover to standing tasks without ankle weight on for both cone taps and standing L LE D1 PNF. Patient ambulating 191ft with FWW + CGA without ankle weight. Moderate carryover from previous tasks with ankle weight to minimize ataxia. Patient ambulated back to room ~160ft with FWW + CGA requesting to use bathroom. Patient requesting to remain there for some time, NT + charge nurse aware, call string within reach.   Therapy Documentation Precautions:  Precautions Precautions: Fall, Other (comment) Precaution Comments: LUE and LLE hemiparesis with impaired coordination Required Braces or Orthoses: Other Brace Other Brace: L AFO Restrictions Weight Bearing Restrictions: No   Therapy/Group: Individual Therapy  Karoline Caldwell, PT, DPT, CBIS 07/07/2020, 7:51 AM

## 2020-07-07 NOTE — Progress Notes (Signed)
Inpatient Rehabilitation  Patient information reviewed and entered into eRehab system by Brayon Bielefeld M. Jaylie Neaves, M.A., CCC/SLP, PPS Coordinator.  Information including medical coding, functional ability and quality indicators will be reviewed and updated through discharge.    

## 2020-07-07 NOTE — Progress Notes (Signed)
Inpatient [place] Individual Statement of Services  Patient Name:  [name]  Date:  07/07/2020  Welcome to the [facility].  Our goal is to provide you with an individualized program based on your diagnosis and situation, designed to meet your specific needs.  With this comprehensive rehabilitation program, you will be expected to participate in at least 3 hours of rehabilitation therapies Monday-Friday, with modified therapy programming on the weekends.  Your rehabilitation program will include the following services:  Physical Therapy (PT), Occupational Therapy (OT), Speech Therapy (ST), 24 hour per day rehabilitation nursing, Therapeutic Recreaction (TR), Neuropsychology, Care Coordinator, Rehabilitation Medicine, Nutrition Services, Pharmacy Services and Other  Weekly team conferences will be held on Wednesdays to discuss your progress.  Your Inpatient Rehabilitation Care Coordinator will talk with you frequently to get your input and to update you on team discussions.  Team conferences with you and your family in attendance may also be held.  Expected length of stay: 5-7 Days  Overall anticipated outcome: MOD I  Depending on your progress and recovery, your program may change. Your Inpatient Rehabilitation Care Coordinator will coordinate services and will keep you informed of any changes. Your Inpatient Rehabilitation Care Coordinator's name and contact numbers are listed  below.  The following services may also be recommended but are not provided by the [facility]:    [place] will be made to provide these services after discharge if needed.  Arrangements include referral to agencies that provide these services.  Your insurance has been verified to be:  Medicare Your primary doctor is:  Tis[place], [name], MD  Pertinent information will be  shared with your doctor and your insurance company.  Inpatient Rehabilitation Care Coordinator:  Erlene Quan, [place] or [phone]  Information discussed with and copy given to patient by: [name], 07/07/2020, 11:32 AM

## 2020-07-07 NOTE — Progress Notes (Signed)
Chaplain engaged in initial visit with [name] and her husband [name].  Chaplain explained Advanced Directive and [name] expressed that she and her husband already have something in place.  Chaplain also offered support.  [name] stated that she has had several strokes and that she was in the hospital a couple of months ago due to another stroke.  Chaplain uplifted [name]'s personal story.  [name] stated that she doesn't know when she will be discharged yet.     Chaplain will follow-up as needed.

## 2020-07-07 NOTE — Plan of Care (Signed)
  Problem: Consults Goal: RH STROKE PATIENT EDUCATION Description: See Patient Education module for education specifics. < Outcome: Progressing   Problem: RH SKIN INTEGRITY Goal: RH STG SKIN FREE OF INFECTION/BREAKDOWN Description: Skin will remain free of breakdown and infection while on rehab with min assist Outcome: Progressing   Problem: RH SAFETY Goal: RH STG ADHERE TO SAFETY PRECAUTIONS W/ASSISTANCE/DEVICE Description: STG Adhere to Safety Precautions With Assistance/Device. min Outcome: Progressing   Problem: RH PAIN MANAGEMENT Goal: RH STG PAIN MANAGED AT OR BELOW PT'S PAIN GOAL Description: Less than 4 Outcome: Progressing

## 2020-07-07 NOTE — Progress Notes (Signed)
ANTICOAGULATION CONSULT NOTE - Follow-Up Consult  Pharmacy Consult for Warfarin Indication: mechanical MVR  Allergies  Allergen Reactions  . Zoloft [Sertraline] Hives, Itching and Rash    Patient Measurements: Height: 5\' 7"  (170.2 cm) Weight: 59.2 kg (130 lb 8.2 oz) IBW/kg (Calculated) : 61.6  Vital Signs: Temp: 97.5 F (36.4 C) (08/30 0428) Temp Source: Oral (08/30 0428) BP: 99/70 (08/30 0428) Pulse Rate: 85 (08/30 0428)  Labs: Recent Labs    07/05/20 0507 07/06/20 0524 07/07/20 0536  HGB  --   --  12.4  HCT  --   --  39.1  PLT  --   --  164  LABPROT 28.6* 31.6* 35.7*  INR 2.8* 3.2* 3.7*  CREATININE  --   --  0.80    Estimated Creatinine Clearance: 57.7 mL/min (by C-G formula based on SCr of 0.8 mg/dL).   Medical History: Past Medical History:  Diagnosis Date  . Abnormality of gait 09/07/2016  . Allergy   . Diabetes mellitus without complication [facility])    Patient denies this - notes history of glucose intolerance  . GERD (gastroesophageal reflux disease)   . Hemiparesis and alteration of sensations as late effects of stroke ([place]) 09/07/2016  . History of pneumonia 1997  . Hypertension   . S/P MVR (mitral valve replacement)    Mechanical mitral valve replacement at age 45 (done in [place])  // echo 7/17: EF 55-60, normal wall motion, bileaflet mechanical mitral valve prosthesis functioning normally, mild LAE, mildly reduced RVSF, small pericardial effusion  . Stroke [facility]) 1997, 2013, 2015    Medications:  See electronic med rec  Assessment: 74 yo female presents with dizziness, now with acute ischemic stroke. PTA the patient is on warfarin for mechanical MVR and h/o hemispheric strokes. INR 8/23 0000 was therapeutic (3.2). Neurology agrees with continuing warfarin due benefits for heart valve outweigh due to low risk for hemorrhagic transformation with small stroke size. Pharmacy is consulted to dose warfarin. The patient was on enoxaparin but is since  discontinued.  The patient's INR is 3.7,  Increased above the goal of  3-3.5,  no bleeding noted , CBC wnl stable.   The patient has been eating 100% of meals last couple of days.. There are no contraindicated drug interactions identified except increased of bleeding with Aspirin 81 mg daily. Will continue to monitor INR.   PTA dose: Warfarin PO 5 mg except 7.5 mg Mon and Fri and patient states that INR fluctuates at home.  Goal of Therapy:  INR goal 3-3.5 Monitor platelets by anticoagulation protocol: Yes   Plan:  - Warfarin PO 5 mg x1 dose tonight - Obtain daily PT/INR, CBC on Mondays - Monitor for signs and symptoms of bleeding - Monitor drug interactions and oral intake   Shauna Hugh, PharmD, [place]  PGY-1 Pharmacy Resident 07/07/2020 9:01 AM  Please check [email or web address] for unit-specific pharmacy phone numbers.

## 2020-07-07 NOTE — Progress Notes (Signed)
Patient ID: [name], female   DOB: 11/05/1946, 74 y.o.   MRN: [id number] Met with the patient and spouse to review role of nurse CM and review educational needs for secondary stroke prevention. Patient was here a little over a year ago and felt like she had things under control except for the INR-coumadin. Reports she is unclear as to what foods she could eat and if she can eat out in a restaurant for fear that she will eat too much of an item that will throw off her INR. Dietician consult to review diet with her and her husband. Patient reports she was told she should eat 2 cups of romaine lettuce in a salad once a week and then only eats carrot/corn the rest of the week so as not to take in too many green leafy vegetables. Bland diet overall however would like to be able to eat out on occasion and not eat the wrong items. Also reviewed prediabetes, HTN and statin prescribed. Given information on lacunar strokes, prediabetes, eating after a stroke and cooking with out salt. Spouse reported he is familiar with monitoring her diet and watching carb intake. No other questions or concerns, educational needs at present. Margarito Liner

## 2020-07-07 NOTE — Progress Notes (Signed)
   Patient Details  Name: [name] MRN: [id number] Date of Birth: [birth year]  Today's Date: 07/07/2020  Hospital Problems: Principal Problem:   Subcortical infarction [facility])  Past Medical History:  Past Medical History:  Diagnosis Date  . Abnormality of gait 09/07/2016  . Allergy   . Diabetes mellitus without complication [facility])    Patient denies this - notes history of glucose intolerance  . GERD (gastroesophageal reflux disease)   . Hemiparesis and alteration of sensations as late effects of stroke ([place]) 09/07/2016  . History of pneumonia 1997  . Hypertension   . S/P MVR (mitral valve replacement)    Mechanical mitral valve replacement at age 62 (done in [place])  // echo 7/17: EF 55-60, normal wall motion, bileaflet mechanical mitral valve prosthesis functioning normally, mild LAE, mildly reduced RVSF, small pericardial effusion  . Stroke [facility]) 1997, 2013, 2015   Past Surgical History:  Past Surgical History:  Procedure Laterality Date  . ABDOMINAL HYSTERECTOMY    . BRAIN SURGERY    . BUNIONECTOMY  1993  . CARDIAC VALVE REPLACEMENT  1997  . TOE SURGERY  1996   Social History:  reports that she has never smoked. She has never used smokeless tobacco. She reports that she does not drink alcohol and does not use drugs.  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 Education: Masters Read: Yes Write: Yes Employment Status: Retired   Abuse/Neglect Abuse/Neglect Assessment Can Be Completed: Yes Physical Abuse: Denies Verbal Abuse: Denies Sexual Abuse: Denies Exploitation of patient/patient's resources:  Denies Self-Neglect: Denies  Emotional Status Pt's affect, behavior and adjustment status: Pt and spouse have not noticed any mood changes Recent Psychosocial Issues: no Psychiatric History: no Substance Abuse History: no  Patient / Family Perceptions, Expectations & Goals Pt/Family understanding of illness & functional limitations: Yes, husband feels updated thus far Premorbid pt/family roles/activities: spouse, parents, teacher Anticipated changes in roles/activities/participation: Husband will provide care and asisst pt Pt/family expectations/goals: Goal to get more independent and return home  [employer]: None Premorbid Home Care/DME Agencies: None Financial trader) Transportation available at discharge: Spouse able to transport  Discharge Planning Living Arrangements: Spouse/significant other Support Systems: Spouse/significant other Type of Residence: Private residence (2 level home, patient set up on 1st floor) Insurance Resources: Chartered certified accountant Resources: SSD Living Expenses: Own Money Management: Spouse, Patient Does the patient have any problems obtaining your medications?: No Home Management: previously independent Care Coordinator Anticipated Follow Up Needs: HH/OP Expected length of stay: 5-7 Days  Clinical Impression Sw entered room, pt being educated and given handouts by nursing case manager Neoma Laming). Spouse at bedside. Introduced self, explained role and process. Pt and spouse familiar with [facility] processes, due to being admitted previously. Also familiar with Dr. Letta Pate.Pt requesting to have OP post recommendations. No other questions or concerns, will continue to follow up with questions and concerns.     [name] 07/07/2020, 12:28 PM

## 2020-07-07 NOTE — Progress Notes (Signed)
[place] PHYSICAL MEDICINE & REHABILITATION PROGRESS NOTE   Subjective/Complaints:   ROS: Denies pain, constipation, insomnia  Objective:   No results found. Recent Labs    07/07/20 0536  WBC 3.3*  HGB 12.4  HCT 39.1  PLT 164   Recent Labs    07/07/20 0536  NA 140  K 4.0  CL 107  CO2 26  GLUCOSE 118*  BUN 28*  CREATININE 0.80  CALCIUM 9.3    Intake/Output Summary (Last 24 hours) at 07/07/2020 1143 Last data filed at 07/07/2020 0700 Gross per 24 hour  Intake 899 ml  Output --  Net 899 ml     Physical Exam: Vital Signs Blood pressure 99/70, pulse 85, temperature (!) 97.5 F (36.4 C), temperature source Oral, resp. rate 16, height 5\' 7"  (1.702 m), weight 59.2 kg, SpO2 96 %.  General: No acute distress Mood and affect are appropriate Heart: Regular rate and rhythm no rubs murmurs or extra sounds Lungs: Clear to auscultation, breathing unlabored, no rales or wheezes Abdomen: Positive bowel sounds, soft nontender to palpation, nondistended Extremities: No clubbing, cyanosis, or edema Skin: No evidence of breakdown, no evidence of rash Neurologic: 5/5 on RIght side 4/5 on left except L ADF 3-  Psych: Pt's affect is appropriate. Pt is cooperative   Assessment/Plan: 1. Functional deficits secondary to subcortical infarcion which require 3+ hours per day of interdisciplinary therapy in a comprehensive inpatient rehab setting.  Physiatrist is providing close team supervision and 24 hour management of active medical problems listed below.  Physiatrist and rehab team continue to assess barriers to discharge/monitor patient progress toward functional and medical goals  Care Tool:  Bathing    Body parts bathed by patient: Left arm, Chest, Right arm, Left upper leg, Right lower leg, Left lower leg, Abdomen, Front perineal area, Face, Buttocks, Right upper leg         Bathing assist Assist Level: Minimal Assistance - Patient > 75%     Upper Body  Dressing/Undressing Upper body dressing   What is the patient wearing?: Hospital gown only    Upper body assist Assist Level: Independent    Lower Body Dressing/Undressing Lower body dressing      What is the patient wearing?: Underwear/pull up     Lower body assist Assist for lower body dressing: Independent     Toileting Toileting    Toileting assist Assist for toileting: Moderate Assistance - Patient 50 - 74%     Transfers Chair/bed transfer  Transfers assist     Chair/bed transfer assist level: Minimal Assistance - Patient > 75%     Locomotion Ambulation   Ambulation assist      Assist level: Minimal Assistance - Patient > 75% Assistive device: No Device Max distance: 257ft   Walk 10 feet activity   Assist     Assist level: Minimal Assistance - Patient > 75% Assistive device: No Device   Walk 50 feet activity   Assist    Assist level: Minimal Assistance - Patient > 75% Assistive device: No Device    Walk 150 feet activity   Assist    Assist level: Minimal Assistance - Patient > 75% Assistive device: No Device    Walk 10 feet on uneven surface  activity   Assist     Assist level: Minimal Assistance - Patient > 75% Assistive device: Other (comment) (using railing support)   Wheelchair     Assist Will patient use wheelchair at discharge?: No  Wheelchair 50 feet with 2 turns activity    Assist            Wheelchair 150 feet activity     Assist          Blood pressure 99/70, pulse 85, temperature (!) 97.5 F (36.4 C), temperature source Oral, resp. rate 16, height 5\' 7"  (1.702 m), weight 59.2 kg, SpO2 96 %.   Medical Problem List and Plan: 1.  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  Old right parietal encephalomalacia/old left thalamic and  bilateral cerebellar lacunar infarcts             -patient may shower             -ELOS/Goals: modI in 6-8 days  -Continue CIR  2.  Antithrombotics: -DVT/anticoagulation: Chronic Coumadin for history of MVR.  Goal INR 3.0-3.5.  INR is 3.2 on 8/29, 3.7 today hgb stable.             -antiplatelet therapy: Aspirin 81 mg daily 3. Pain Management: Tylenol as needed. Well controlled 4. Mood: Advised emotional support             -antipsychotic agents: N/A 5. Neuropsych: This patient is capable of making decisions on her own behalf. 6. Skin/Wound Care: Routine skin checks. May remove IV 8/27. 7. Fluids/Electrolytes/Nutrition: Routine in and outs with follow-up chemistries 8.  Hypertension.  Norvasc 10 mg daily.  Monitor with increased mobility. Well controlled. Well controlled to soft on last 5 reads. Reduce  Norvasc to 5mg . Soft BPs Vitals:   07/06/20 1325 07/07/20 0428  BP: 110/71 99/70  Pulse: 75 85  Resp: 18 16  Temp: 97.8 F (36.6 C) (!) 97.5 F (36.4 C)  SpO2: 95% 96%   9.  Hyperlipidemia.  Lipitor 10.  History of mitral valve replacement.  Continue chronic Coumadin.INR goal 3-3.5  3.7 today Hgb normal  11.  Prediabetes.  Hemoglobin A1c 6.1.  Blood sugar checks discontinued 12.  GERD.  Protonix  LOS: 3 days A FACE TO [name] 07/07/2020, 11:43 AM

## 2020-07-08 ENCOUNTER — Inpatient Hospital Stay (HOSPITAL_COMMUNITY): Payer: Medicare Other | Admitting: Physical Therapy

## 2020-07-08 ENCOUNTER — Inpatient Hospital Stay (HOSPITAL_COMMUNITY): Payer: Medicare Other | Admitting: Occupational Therapy

## 2020-07-08 ENCOUNTER — Inpatient Hospital Stay (HOSPITAL_COMMUNITY): Payer: Medicare Other

## 2020-07-08 LAB — PROTIME-INR
INR: 3.5 — ABNORMAL HIGH (ref 0.8–1.2)
Prothrombin Time: 34.3 seconds — ABNORMAL HIGH (ref 11.4–15.2)

## 2020-07-08 MED ORDER — WARFARIN SODIUM 5 MG PO TABS
5.0000 mg | ORAL_TABLET | Freq: Once | ORAL | Status: AC
  Administered 2020-07-08: 5 mg via ORAL
  Filled 2020-07-08: qty 1

## 2020-07-08 NOTE — Progress Notes (Signed)
Physical Therapy Session Note  Patient Details  Name: [name] MRN: [id number] Date of Birth: Sep 21, 1946  Today's Date: 07/08/2020 PT Individual Time: 1552-1650 PT Individual Time Calculation (min): 58 min   Short Term Goals: Week 1:  PT Short Term Goal 1 (Week 1): = to LTGs based on ELOS  Skilled Therapeutic Interventions/Progress Updates:    Pt received sitting EOB reporting she just woke up from a nap ~54minutes ago and that she was exhausted after her AM therapy sessions. Despite this pt agreeable to therapy at this time. Sit<>stands with min/mod assist for balance working on increased L LE weight bearing, improved L LE alignment (decreased hip adduction), and decreased compensation via pushing up with R hand - demos improving anterior trunk lean compared to earlier session. Gait training ~176ft, no AD wearing L LE AFO, with min assist for balance - continues to demo slight L LE circumduction with decreased hip/knee flexion for foot clearance during swing and lack of heel strike on initial contact. Assessed vitals: BP 127/77 (MAP 94), HR 74bpm Stepped on/off treadmill, no harness, with min assist for balance and using UE support on litegait handles. Standing with UE support donned harness.  Participated in the following locomotor treadmill training trials while in [place] for safety (no BWS) and using B UE support: 1st: 27minutes at 0.48mph totaling 370ft - therapist placing bean bags on treadmill to promote increased L LE hip/knee flexion for foot clearance in swing, increased step length, and improved heel strike  2nd: 42min12sec at 60mph totaling 459ft - therapist intermittently using bean bags to promote above improvements, also cuing to keep L foot close to midline on track to decrease circumduction Doffed harness and stepped off treadmill as described above. Gait training ~173ft, no AD, with CGA primarily and a few instances of min assist - demos significantly improving L LE  step length, foot clearance, and heel strike. Sit<>supine on bed with supervision, HOB flat but using bedrails as needed. In supine, assessed L LE spasticity with pt noted to have Modified Ashworth Scale of 1 in hamstrings and 1+ in quadriceps - anticipate this is the main contributor to pt's "ataxic" appearing L LE discoordinated movements during swing phase of gait. R stand pivot to w/c, no AD, with CGA for steadying. Pt left seated in w/c with needs in reach and seat belt alarm on.  Therapy Documentation Precautions:  Precautions Precautions: Fall, Other (comment) Precaution Comments: LUE and LLE hemiparesis with impaired coordination Required Braces or Orthoses: Other Brace Other Brace: L AFO Restrictions Weight Bearing Restrictions: No  Pain:   Continues to report L lateral proximal thigh "throbbing" sensation - provided seated rest breaks and repositioning for pain management.   Therapy/Group: Individual Therapy  Tawana Scale , PT, DPT, CSRS  07/08/2020, 6:05 PM

## 2020-07-08 NOTE — Progress Notes (Signed)
Physical Therapy Session Note  Patient Details  Name: [name] MRN: [id number] Date of Birth: 20-Jun-1946  Today's Date: 07/08/2020 PT Individual Time: 1008-1051 PT Individual Time Calculation (min): 43 min   Short Term Goals: Week 1:  PT Short Term Goal 1 (Week 1): = to LTGs based on ELOS  Skilled Therapeutic Interventions/Progress Updates:    Pt received sitting in w/c and agreeable to therapy session. Gait training ~160ft to main therapy gym, no AD, with min assist for balance - demos decreased L LE step length with lack of heel strike on initial contact with ataxic movements and lack of adequate R weight shift during stance. L LE NMR via L heel taps on/off 6" step while wearing 3lb ankle weight and no UE support - mirror feedback available but pt doesn't use it - min assist for balance and multimodal cuing for increased R weight shift and upright posture as pt repeatedly flexed trunk to L and leaned into therapist for balance. Gait training ~12ft, no AD, while wearing 3lb ankle weight with CGA/min assist for balance - demos improving L LE step length and progressing towards a heel strike with less ataxic movements while wearing weight. L LE NMR at stairs while wearing 3lb ankle weight via L heel tapping on/off 2nd step and bringing back for 1/2 stride stance using B UE support on HRs with CGA for steadying. Gait training ~45ft, no AD but wearing 3lb ankle weight, with CGA/min assist and pt continues to demo L lateral trunk flexion during R stance and decreased L swing and lack of heel strike. Sit<>supine on mat table with CGA. Bridging x15 reps with level 2 theraband around knees to promote increased hip abductor activation - cuing for increased glute activation. Transitioned to repeated sit<>stands to/from elevated EOM while using level 2 theraband to promote hip abductor activation, without UE support, x12reps - mod assist for balance due to strong posterior lean - multimodal cuing for  anterior trunk lean/weight shift and maintaining L hip abduction - transitioned to R LE on 2" step to promote increased L weight shift as pt noticed to be biasing R side. Gait training ~150ft back to room, no AD, with CGA/min assist for balance/steadying and pt demoing minimal carryover of NMR - while ambulating provided tactile cuing to avoid L hip circumduction during swing and promote increased hip/knee flexion followed by knee extension but pt reports her LEs are fatiguing making it harder to improve her gait. Pt left seated in w/c with needs in reach and seat belt alarm on.  Therapy Documentation Precautions:  Precautions Precautions: Fall, Other (comment) Precaution Comments: LUE and LLE hemiparesis with impaired coordination Required Braces or Orthoses: Other Brace Other Brace: L AFO Restrictions Weight Bearing Restrictions: No  Pain: Reports a "throbbing" type pain along lateral L thigh, unrated, provided seated rest breaks and repositioning for pain management.   Therapy/Group: Individual Therapy  Tawana Scale , PT, DPT, CSRS  07/08/2020, 7:55 AM

## 2020-07-08 NOTE — Progress Notes (Signed)
Occupational Therapy Session Note  Patient Details  Name: [name] MRN: [id number] Date of Birth: [birth year]  Today's Date: 07/08/2020 OT Individual Time: 1100-1209 OT Individual Time Calculation (min): 69 min    Short Term Goals: Week 1:  OT Short Term Goal 1 (Week 1): STGs equal to LTGS set at supervision level overall.  Skilled Therapeutic Interventions/Progress Updates:    Patient seated in w/c, she denies pain and states that all self care needs were already met for this am.  Assisted to therapy gym via w/c.  She notes that she would like to work on grasp, dexterity and arm swing.  SPT to/from w/c and mat table with CGA.  Unsupported sitting with DS and cues for posture.  Completed hand pinch and dexterity activities - note bilateral limited thumb/palm mobility due to thenar tightness (probable arthritic changes) reviewed stretching, massage, AROM and weight bearing activities with fair carryover.  Completed standing coordination and UB activities to promote increased arm swing.  Completed scapular mobility, stretching and weight bearing to promote posture and shoulder position.   Returned to bed at close of session with CS.  She is able to recall tasks but requires cues for details when relaying activities to her husband.  Bed alarm set and call bell in reach.    Therapy Documentation Precautions:  Precautions Precautions: Fall, Other (comment) Precaution Comments: LUE and LLE hemiparesis with impaired coordination Required Braces or Orthoses: Other Brace Other Brace: L AFO Restrictions Weight Bearing Restrictions: No   Therapy/Group: Individual Therapy  Carlos Levering 07/08/2020, 7:39 AM

## 2020-07-08 NOTE — Progress Notes (Signed)
Brief Nutrition Note  RD consulted for diet education regarding vitamin K and warfarin. RD working remotely.  Spoke with pt via phone call to room. RD will plan to meet with pt in-person between therapy sessions tomorrow, 07/09/20. Pt with questions about foods to eat, foods to avoid, etc. Pt has prepared a list of common foods that she eats and would like RD to review it. Pt also with questions about eating out.  RD will address questions and provide educational handouts at planned visit tomorrow.   Gaynell Face, MS, RD, LDN Inpatient Clinical Dietitian Please see AMiON for contact information.

## 2020-07-08 NOTE — Progress Notes (Signed)
Physical Therapy Session Note  Patient Details  Name: ALFRIEDA TARRY MRN: [id number] Date of Birth: 05-19-1946  Today's Date: 07/08/2020 PT Individual Time: 0800-0828 PT Individual Time Calculation (min): 28 min   Short Term Goals: Week 1:  PT Short Term Goal 1 (Week 1): = to LTGs based on ELOS  Skilled Therapeutic Interventions/Progress Updates:    Patient received sitting EOB dressing with NT. Patient agreeable to PT assist to complete dressing. She demonstrates good dynamic sitting balance EOB without back support. L UE able to assist with facilitating dressing with set up assist. Patient requiring CGA for STS to complete lower body dressing. PT pushed patient in wc to gym for time management. She was able to ambulate ~247ft with no AD and up to [facility] for postural support/balance. Improving L LE ataxia noted, but patient does continue to abduct L LE especially when turning. Patient requesting to return to bed, bed alarm on and call light within reach.    Therapy Documentation Precautions:  Precautions Precautions: Fall, Other (comment) Precaution Comments: LUE and LLE hemiparesis with impaired coordination Required Braces or Orthoses: Other Brace Other Brace: L AFO Restrictions Weight Bearing Restrictions: No    Therapy/Group: Individual Therapy  Karoline Caldwell , PT, DPT, CBIS 07/08/2020, 7:44 AM

## 2020-07-08 NOTE — Progress Notes (Signed)
ANTICOAGULATION CONSULT NOTE - Follow-Up Consult  Pharmacy Consult for Warfarin Indication: mechanical MVR  Allergies  Allergen Reactions  . Zoloft [Sertraline] Hives, Itching and Rash    Patient Measurements: Height: 5\' 7"  (170.2 cm) Weight: 59.2 kg (130 lb 8.2 oz) IBW/kg (Calculated) : 61.6  Vital Signs: Temp: 98 F (36.7 C) (08/31 0436) BP: 123/76 (08/31 0436) Pulse Rate: 64 (08/31 0436)  Labs: Recent Labs    07/06/20 0524 07/07/20 0536 07/08/20 0612  HGB  --  12.4  --   HCT  --  39.1  --   PLT  --  164  --   LABPROT 31.6* 35.7* 34.3*  INR 3.2* 3.7* 3.5*  CREATININE  --  0.80  --     Estimated Creatinine Clearance: 57.7 mL/min (by C-G formula based on SCr of 0.8 mg/dL).   Medical History: Past Medical History:  Diagnosis Date  . Abnormality of gait 09/07/2016  . Allergy   . Diabetes mellitus without complication [facility])    Patient denies this - notes history of glucose intolerance  . GERD (gastroesophageal reflux disease)   . Hemiparesis and alteration of sensations as late effects of stroke ([place]) 09/07/2016  . History of pneumonia 1997  . Hypertension   . S/P MVR (mitral valve replacement)    Mechanical mitral valve replacement at age 52 (done in [place])  // echo 7/17: EF 55-60, normal wall motion, bileaflet mechanical mitral valve prosthesis functioning normally, mild LAE, mildly reduced RVSF, small pericardial effusion  . Stroke [facility]) 1997, 2013, 2015    Medications:  See electronic med rec  Assessment: 74 yo female presents with dizziness, now with acute ischemic stroke. PTA the patient is on warfarin for mechanical MVR and h/o hemispheric strokes. INR on admit 8/23 was therapeutic (3.2). Neurology agrees with continuing warfarin due benefits for heart valve outweigh due to low risk for hemorrhagic transformation with small stroke size. Pharmacy is consulted to dose warfarin. The patient was on enoxaparin but is since discontinued.  The patient's INR  is 3.5 today,  therapeutic, goal 3-3.5, CBC wnl stable on 8/30. No bleeding noted. Meal intake continues at mostly 100% for last few days. There are no contraindicated drug interactions identified except increased of bleeding with Aspirin 81 mg daily. Will continue to monitor INR.    PTA dose: Warfarin PO 5 mg except 7.5 mg Mon and Fri and patient states that INR fluctuates at home.  Goal of Therapy:  INR goal 3-3.5 Monitor platelets by anticoagulation protocol: Yes   Plan:  - Warfarin PO 5 mg x1 dose tonight - Obtain daily PT/INR, CBC on Mondays - Monitor for signs and symptoms of bleeding - Monitor drug interactions and oral intake   Nicole Cella, RPh Clinical Pharmacist [phone]  After 3:30 pm Please check AMION for all [place] phone numbers.  07/08/2020 10:15 AM

## 2020-07-08 NOTE — Progress Notes (Signed)
[place] PHYSICAL MEDICINE & REHABILITATION PROGRESS NOTE   Subjective/Complaints:  Pt just finished PT, amb minA without device  ROS: Denies pain, constipation, insomnia  Objective:   No results found. Recent Labs    07/07/20 0536  WBC 3.3*  HGB 12.4  HCT 39.1  PLT 164   Recent Labs    07/07/20 0536  NA 140  K 4.0  CL 107  CO2 26  GLUCOSE 118*  BUN 28*  CREATININE 0.80  CALCIUM 9.3    Intake/Output Summary (Last 24 hours) at 07/08/2020 8841 Last data filed at 07/08/2020 0759 Gross per 24 hour  Intake 480 ml  Output --  Net 480 ml     Physical Exam: Vital Signs Blood pressure 123/76, pulse 64, temperature 98 F (36.7 C), resp. rate 17, height 5\' 7"  (1.702 m), weight 59.2 kg, SpO2 97 %.  General: No acute distress Mood and affect are appropriate Heart: Regular rate and rhythm no rubs murmurs or extra sounds Lungs: Clear to auscultation, breathing unlabored, no rales or wheezes Abdomen: Positive bowel sounds, soft nontender to palpation, nondistended Extremities: No clubbing, cyanosis, or edema Skin: No evidence of breakdown, no evidence of rash Neurologic: 5/5 on RIght side 4/5 on left except L ADF 3-  Psych: Pt's affect is appropriate. Pt is cooperative   Assessment/Plan: 1. Functional deficits secondary to left frontal  infarcion which require 3+ hours per day of interdisciplinary therapy in a comprehensive inpatient rehab setting.  Physiatrist is providing close team supervision and 24 hour management of active medical problems listed below.  Physiatrist and rehab team continue to assess barriers to discharge/monitor patient progress toward functional and medical goals  Care Tool:  Bathing    Body parts bathed by patient: Left arm, Chest, Right arm, Left upper leg, Right lower leg, Left lower leg, Abdomen, Front perineal area, Face, Buttocks, Right upper leg         Bathing assist Assist Level: Minimal Assistance - Patient > 75%     Upper  Body Dressing/Undressing Upper body dressing   What is the patient wearing?: Hospital gown only    Upper body assist Assist Level: Independent    Lower Body Dressing/Undressing Lower body dressing      What is the patient wearing?: Underwear/pull up     Lower body assist Assist for lower body dressing: Independent     Toileting Toileting    Toileting assist Assist for toileting: Moderate Assistance - Patient 50 - 74%     Transfers Chair/bed transfer  Transfers assist     Chair/bed transfer assist level: Minimal Assistance - Patient > 75%     Locomotion Ambulation   Ambulation assist      Assist level: Contact Guard/Touching assist Assistive device: Walker-rolling Max distance: 140   Walk 10 feet activity   Assist     Assist level: Contact Guard/Touching assist Assistive device: Walker-rolling   Walk 50 feet activity   Assist    Assist level: Contact Guard/Touching assist Assistive device: Walker-rolling    Walk 150 feet activity   Assist    Assist level: Contact Guard/Touching assist Assistive device: Walker-rolling    Walk 10 feet on uneven surface  activity   Assist     Assist level: Minimal Assistance - Patient > 75% Assistive device: Other (comment) (using railing support)   Wheelchair     Assist Will patient use wheelchair at discharge?: No             Wheelchair 50  feet with 2 turns activity    Assist            Wheelchair 150 feet activity     Assist          Blood pressure 123/76, pulse 64, temperature 98 F (36.7 C), resp. rate 17, height 5\' 7"  (1.702 m), weight 59.2 kg, SpO2 97 %.   Medical Problem List and Plan: 1.  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  Antithrombotics: -DVT/anticoagulation: Chronic Coumadin for history of MVR.  Goal INR 3.0-3.5.  INR is 3.2 on 8/29, 3.7 on 8/30 , 3.5 8/31  hgb stable.             -antiplatelet therapy: Aspirin 81 mg daily 3. Pain Management: Tylenol as needed. Well controlled 4. Mood: Advised emotional support             -antipsychotic agents: N/A 5. Neuropsych: This patient is capable of making decisions on her own behalf. 6. Skin/Wound Care: Routine skin checks. May remove IV 8/27. 7. Fluids/Electrolytes/Nutrition: Routine in and outs with follow-up chemistries 8.  Hypertension.  Norvasc 10 mg daily.  Monitor with increased mobility.  Norvasc to 5mg .  Vitals:   07/07/20 1933 07/08/20 0436  BP: (!) 131/93 123/76  Pulse: 76 64  Resp: 16 17  Temp: 98 F (36.7 C) 98 F (36.7 C)  SpO2: 96% 97%  controlled , monitor on current meds  9.  Hyperlipidemia.  Lipitor 10.  History of mitral valve replacement.  INR goal 3-3.5 INR 3.5 today     11.  Prediabetes.  Hemoglobin A1c 6.1.  Blood sugar checks discontinued 12.  GERD.  Protonix  LOS: 4 days A FACE TO FACE EVALUATION WAS PERFORMED  Charlett Blake 07/08/2020, 8:33 AM

## 2020-07-09 ENCOUNTER — Inpatient Hospital Stay (HOSPITAL_COMMUNITY): Payer: Medicare Other

## 2020-07-09 ENCOUNTER — Encounter (HOSPITAL_COMMUNITY): Payer: Medicare Other | Admitting: Psychology

## 2020-07-09 ENCOUNTER — Inpatient Hospital Stay (HOSPITAL_COMMUNITY): Payer: Medicare Other | Admitting: Occupational Therapy

## 2020-07-09 ENCOUNTER — Inpatient Hospital Stay (HOSPITAL_COMMUNITY): Payer: Medicare Other | Admitting: Physical Therapy

## 2020-07-09 LAB — PROTIME-INR
INR: 3.1 — ABNORMAL HIGH (ref 0.8–1.2)
Prothrombin Time: 31.3 seconds — ABNORMAL HIGH (ref 11.4–15.2)

## 2020-07-09 MED ORDER — WARFARIN SODIUM 7.5 MG PO TABS
7.5000 mg | ORAL_TABLET | Freq: Once | ORAL | Status: AC
  Administered 2020-07-09: 7.5 mg via ORAL
  Filled 2020-07-09: qty 1

## 2020-07-09 NOTE — Progress Notes (Addendum)
[place] PHYSICAL MEDICINE & REHABILITATION PROGRESS NOTE   Subjective/Complaints: Great, busy day in therapy yesterday slept well.   ROS: Denies pain, constipation, insomnia  Objective:   No results found. Recent Labs    07/07/20 0536  WBC 3.3*  HGB 12.4  HCT 39.1  PLT 164   Recent Labs    07/07/20 0536  NA 140  K 4.0  CL 107  CO2 26  GLUCOSE 118*  BUN 28*  CREATININE 0.80  CALCIUM 9.3    Intake/Output Summary (Last 24 hours) at 07/09/2020 0815 Last data filed at 07/09/2020 0733 Gross per 24 hour  Intake 740 ml  Output --  Net 740 ml     Physical Exam: Vital Signs Blood pressure 94/72, pulse 95, temperature 97.6 F (36.4 C), resp. rate 18, height 5' 7" (1.702 m), weight 59.2 kg, SpO2 97 %.  General: No acute distress Mood and affect are appropriate Heart: Regular rate and rhythm no rubs murmurs or extra sounds Lungs: Clear to auscultation, breathing unlabored, no rales or wheezes Abdomen: Positive bowel sounds, soft nontender to palpation, nondistended Extremities: No clubbing, cyanosis, or edema Skin: No evidence of breakdown, no evidence of rash Neurologic: 5/5 on RIght side 4/5 on left except L ADF 3-  Psych: Pt's affect is appropriate. Pt is cooperative   Assessment/Plan: 1. Functional deficits secondary to left frontal  infarcion which require 3+ hours per day of interdisciplinary therapy in a comprehensive inpatient rehab setting.  Physiatrist is providing close team supervision and 24 hour management of active medical problems listed below.  Physiatrist and rehab team continue to assess barriers to discharge/monitor patient progress toward functional and medical goals  Care Tool:  Bathing    Body parts bathed by patient: Left arm, Chest, Right arm, Left upper leg, Right lower leg, Left lower leg, Abdomen, Front perineal area, Face, Buttocks, Right upper leg         Bathing assist Assist Level: Minimal Assistance - Patient > 75%      Upper Body Dressing/Undressing Upper body dressing   What is the patient wearing?: Hospital gown only    Upper body assist Assist Level: Independent    Lower Body Dressing/Undressing Lower body dressing      What is the patient wearing?: Underwear/pull up     Lower body assist Assist for lower body dressing: Independent     Toileting Toileting    Toileting assist Assist for toileting: Moderate Assistance - Patient 50 - 74%     Transfers Chair/bed transfer  Transfers assist     Chair/bed transfer assist level: Contact Guard/Touching assist     Locomotion Ambulation   Ambulation assist      Assist level: Minimal Assistance - Patient > 75% Assistive device: No Device Max distance: 150ft   Walk 10 feet activity   Assist     Assist level: Minimal Assistance - Patient > 75% Assistive device: No Device   Walk 50 feet activity   Assist    Assist level: Minimal Assistance - Patient > 75% Assistive device: No Device    Walk 150 feet activity   Assist    Assist level: Minimal Assistance - Patient > 75% Assistive device: No Device    Walk 10 feet on uneven surface  activity   Assist     Assist level: Minimal Assistance - Patient > 75% Assistive device: Other (comment) (using railing support)   Wheelchair     Assist Will patient use wheelchair at discharge?: No               Wheelchair 50 feet with 2 turns activity    Assist            Wheelchair 150 feet activity     Assist          Blood pressure 94/72, pulse 95, temperature 97.6 F (36.4 C), resp. rate 18, height 5' 7" (1.702 m), weight 59.2 kg, SpO2 97 %.   Medical Problem List and Plan: 1.  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 Formulized individual treatment plan based on medical history, underlying problem and comorbidities.  2.  Antithrombotics: -DVT/anticoagulation: Chronic Coumadin for history of MVR.  Goal INR 3.0-3.5.  INR is 3.2 on 8/29, 3.7 on 8/30 , 3.5 8/31 pending today              -antiplatelet therapy: Aspirin 81 mg daily 3. Pain Management: Tylenol as needed. Well controlled 4. Mood: Advised emotional support             -antipsychotic agents: N/A 5. Neuropsych: This patient is capable of making decisions on her own behalf. 6. Skin/Wound Care: Routine skin checks. May remove IV 8/27. 7. Fluids/Electrolytes/Nutrition: Routine in and outs with follow-up chemistries 8.  Hypertension.  Norvasc 10 mg daily.  Monitor with increased mobility.  Norvasc to 52m.  Vitals:   07/08/20 1953 07/09/20 0501  BP: 108/62 94/72  Pulse: 88 95  Resp: 18   Temp: 98 F (36.7 C) 97.6 F (36.4 C)  SpO2: 99% 97%  controlled , monitor on current meds  9.  Hyperlipidemia.  Lipitor 10.  History of mitral valve replacement.  INR goal 3-3.5 INR pending     11.  Prediabetes.  Hemoglobin A1c 6.1.  Blood sugar checks discontinued 12.  GERD.  Protonix  LOS: 5 days A FACE TO FACE EVALUATION WAS PERFORMED  ACharlett Blake9/11/2019, 8:15 AM

## 2020-07-09 NOTE — Consult Note (Signed)
Neuropsychological Consultation   Patient:   [name]   DOB:   1946-07-15  MR Number:  [id number]  Location:  [place] [id number] [place] [employer] [place] [address] Dept: [place]: [phone]           Date of Service:   07/09/2020  Start Time:   2 PM End Time:   3 PM  Provider/Observer:  Ilean Skill, Psy.D.       Clinical Neuropsychologist       Billing Code/Service: 54627  Chief Complaint:    [name]. 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  Patient had been on the inpatient rehab services between 09/05/2019 and 09/15/2019.  Patient presented on 07/01/2020 with dizziness and weakness over the prior 3 days.  MRI showed single small cortical/cortical ischemic nonhemorrhagic infarction involving the parasagittal posterior left frontal lobe.  There was underlying moderate to advanced age-related cerebral atrophy with chronic vascular ischemic disease and multiple remote lacunar infarcts involving the left thalamus and bilateral cerebellar hemispheres.  Patient did not receive TPA.  After therapy evaluations were completed the patient was admitted to the comprehensive rehab program for further rehabilitative efforts.  Reason for Service:  Patient was referred for neuropsychological consultation due to coping and adjustment issues following her most recent CVA.  The patient has had numerous prior CVAs.  Below is the HPI for the current admission.  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  Per chart review lives with spouse.  Two-level home bed and bath main level.  Independent with light ADLs.  She uses a cane for ambulation as well as left AFO.  She has a son in [place].  Presented 07/01/2020 with dizziness and weakness x3 days.  MRI showed single small cortical subcortical ischemic nonhemorrhagic infarct involving the parasagittal posterior left frontal lobe.  Underlying moderate to advanced age-related cerebral atrophy with chronic microvascular ischemic disease with multiple remote lacunar infarcts involving the left thalamus and bilateral cerebellar hemispheres.  CT angiogram of head and neck with no emergent findings.  Patient did not receive TPA.  Admission chemistries INR 2.9, hemoglobin A1c 6.1, chemistries unremarkable except glucose 139.  Echocardiogram with ejection fraction of 60% no wall motion abnormalities.  Presently maintained on low-dose aspirin as well as ongoing chronic Coumadin with a goal INR of 3.0-3.5.  Tolerating a regular diet.  Therapy evaluations completed and patient was admitted for a comprehensive rehab program.  Current Status:  Patient was oriented and sitting up in her wheelchair when I entered the room.  She displayed good cognition with good expressive language and receptive language abilities, good executive functioning, no impairment or significant impairment in memory or attention and concentration.  Most of the patient's difficulties both prior as well as current have to do with changes in motor function.  The patient's mood was quite good and denied any issues with depression or anxiety and reports that these types of events have been repeating for her and she has adjusted well to them and is not surprised when they happen.  Behavioral Observation: [name]  presents as a 74 y.o.-year-old Right Caucasian Female  who appeared her stated age. her dress was Appropriate and she was Well Groomed and her manners were  Appropriate to the situation.  her participation was indicative of Appropriate and Attentive behaviors.  There were any physical disabilities noted.  she displayed an appropriate level of cooperation and motivation.     Interactions:    Active Appropriate and Attentive  Attention:   within normal limits and attention span and concentration were age appropriate  Memory:   within normal limits; recent and remote memory intact  Visuo-spatial:  not examined  Speech (Volume):  normal  Speech:   normal; normal  Thought Process:  Coherent and Relevant  Though Content:  WNL; not suicidal and not homicidal  Orientation:   person, place, time/date and situation  Judgment:   Good  Planning:   Good  Affect:    Appropriate  Mood:    Euthymic  Insight:   Good  Intelligence:   normal  Medical History:   Past Medical History:  Diagnosis Date  . Abnormality of gait 09/07/2016  . Allergy   . Diabetes mellitus without complication [facility])    Patient denies this - notes history of glucose intolerance  . GERD (gastroesophageal reflux disease)   . Hemiparesis and alteration of sensations as late effects of stroke ([place]) 09/07/2016  . History of pneumonia 1997  . Hypertension   . S/P MVR (mitral valve replacement)    Mechanical mitral valve replacement at age 54 (done in [place])  // echo 7/17: EF 55-60, normal wall motion, bileaflet mechanical mitral valve prosthesis functioning normally, mild LAE, mildly reduced RVSF, small pericardial effusion  . Stroke [facility]) 1997, 2013, 2015      Psychiatric History:  No prior psychiatric history  Family Med/Psych History:  Family History  Problem Relation Age of Onset  . Cancer Mother        Bone  . Heart disease Mother   . Hyperlipidemia Mother   . Hypertension Mother   . Stroke Father   . Hypertension Father   . 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  Patient had been on the inpatient rehab services between 09/05/2019 and 09/15/2019.  Patient presented on 07/01/2020 with dizziness and weakness over the prior 3 days.  MRI showed single small cortical/cortical ischemic nonhemorrhagic infarction involving the parasagittal posterior left frontal lobe.  There was underlying moderate to advanced age-related cerebral atrophy with chronic vascular ischemic disease and multiple remote lacunar infarcts involving the left thalamus and bilateral cerebellar hemispheres.  Patient did not receive TPA.  After therapy evaluations were completed the patient was admitted to the comprehensive rehab program for further rehabilitative efforts.  Patient was oriented and sitting up in her wheelchair when I entered the room.  She displayed good cognition with good expressive language and receptive language abilities, good executive functioning, no impairment or significant impairment in memory or attention and concentration.  Most of the patient's difficulties both prior as well as current have to do with changes in motor function.  The patient's mood was quite good and denied any issues with depression or anxiety and reports that these types of events have been repeating for her and she has adjusted well to them and is not surprised when they happen.  Disposition/Plan:  Worked on issues related to coping with  the most recent cerebrovascular accident as well as dealing with and coping with her significant risk factors for future CVAs.  Diagnosis:    Acute CVA (cerebrovascular accident) [facility]) - Plan: Ambulatory referral to Neurology         Electronically Signed   _______________________ Ilean Skill, Psy.D.

## 2020-07-09 NOTE — Plan of Care (Signed)
Nutrition Education Note  RD consulted for diet education regarding Vitamin K and Coumadin. Handout "Vitamin K and Medications" from the Academy of Nutrition and Dietetics Manual was given. Discussed importance of consistent Vitamin K consumption on a daily basis to aid in INR level consistency. Pt reports difficulty with consuming Vitamin K consistently due to schedule and/or traveling/going out. Pt interested in consuming multivitamin with low dose Vitamin K instead of figuring out appropriate amounts of Vitamin K containing foods. Pt plans to discuss with Cardiology. Expect good compliance. Teach back method used.   Pt is currently on a heart healthy diet. Meal completion 100%. Labs and medications reviewed. No nutrition interventions at this time. Re-consult if nutrition issues arise.   Corrin Parker, MS, RD, LDN RD pager number/after hours weekend pager number on Amion.

## 2020-07-09 NOTE — Progress Notes (Signed)
Occupational Therapy Session Note  Patient Details  Name: [name] MRN: [id number] Date of Birth: 10/20/1946  Today's Date: 07/09/2020 OT Individual Time: 1504-1528 OT Individual Time Calculation (min): 24 min    Short Term Goals: Week 1:  OT Short Term Goal 1 (Week 1): STGs equal to LTGS set at supervision level overall.  Skilled Therapeutic Interventions/Progress Updates:    Pt received sitting up in w/c with no c/o pain. Pt agreeable to OT session focused on LUE NMR/coordination. Pt was transported to the therapy gym via w/c. She completed 3 trials of [employer] in standing with focus on LUE. Pt required min facilitation for overhead LUE reaching. Reaction times for isolated LUE were 3.87 and 2.67. Pt with slight incoordination, but able to hit each target button accurately with increased time for motor planning. Pt had slower reaction time/required more facilitation when reaching across midline, and when alternating R/L reaching during last trial. Pt returned to her room and was left sitting up with all needs met. Chair alarm set.   Therapy Documentation Precautions:  Precautions Precautions: Fall, Other (comment) Precaution Comments: LUE and LLE hemiparesis with impaired coordination Required Braces or Orthoses: Other Brace Other Brace: L AFO Restrictions Weight Bearing Restrictions: No   Therapy/Group: Individual Therapy   H  07/09/2020, 6:29 AM  

## 2020-07-09 NOTE — Progress Notes (Signed)
Physical Therapy Session Note  Patient Details  Name: [name] MRN: [id number] Date of Birth: Apr 05, 1946  Today's Date: 07/09/2020 PT Individual Time: 0800-0858 PT Individual Time Calculation (min): 58 min   Short Term Goals: Week 1:  PT Short Term Goal 1 (Week 1): = to LTGs based on ELOS  Skilled Therapeutic Interventions/Progress Updates:    Patient received supine in bed agreeable to PT. She denies pain at this time. Patient requesting to complete morning ADLs and was able to complete dressing sitting EOB with set up assist + SPV. Verbal cues to engage L UE needed. Patient able to transfer to wc with SBA and complete toileting with set up assist. Patient ambulating x6 bouts of 152ft with FWW, with FWW + 2# ankle weight, 2# AW with no AD, no AW and no AD. Patient maintains ataxic gait pattern with L LE abduction. This improved with added weight and no AD. Patient returned to room in chair, call light within reach and husband at bedside.   Therapy Documentation Precautions:  Precautions Precautions: Fall, Other (comment) Precaution Comments: LUE and LLE hemiparesis with impaired coordination Required Braces or Orthoses: Other Brace Other Brace: L AFO Restrictions Weight Bearing Restrictions: No    Therapy/Group: Individual Therapy  Karoline Caldwell, PT, DPT, CBIS 07/09/2020, 7:46 AM

## 2020-07-09 NOTE — Progress Notes (Signed)
Occupational Therapy Session Note  Patient Details  Name: [name] MRN: [id number] Date of Birth: 1946/04/20  Today's Date: 07/09/2020 OT Individual Time: 7841-2820 OT Individual Time Calculation (min): 73 min    Short Term Goals: Week 1:  OT Short Term Goal 1 (Week 1): STGs equal to LTGS set at supervision level overall.  Skilled Therapeutic Interventions/Progress Updates:    Pt up in the wheelchair to start session.  She was taken down to the day room for session with transfer to the therapy mat with min assist stand pivot.  Pt transitioned to supine with therapist assisting with stretching of the left shoulder adductors and extensors with stretching of the elbow extensors and wrist extensors.  She then transitioned to sitting with min assist and worked on LUE functional use.  Had her work on picking up and manipulating medium sized foam blocks (3"x 2") with the LUE to replicate a design given in the diagram.  Increased tone noted in the elbow extensors and wrist extensors when reaching and placing the blocks.  Also had pt work on shoulder flexion and simulated reaching using the LUE in supine.  Increased tone noted with this especially when attempting to hold a ball with BUEs and maintain shoulder flexion.  Pt transferred back to the wheelchair with min assist and then was returned to the room.  She was educated on functional reach while sitting in her wheelchair for items on her bedside table with emphasis on elbow extension, digit extension, and elbow extension.  Pt's spouse in the room with safety belt in place.   Therapy Documentation Precautions:  Precautions Precautions: Fall, Other (comment) Precaution Comments: LUE and LLE hemiparesis with impaired coordination Required Braces or Orthoses: Other Brace Other Brace: L AFO Restrictions Weight Bearing Restrictions: No   Pain: Pain Assessment Pain Scale: Faces Pain Score: 0-No pain ADL: See Care Tool Section for some details  of mobility and selfcare  Therapy/Group: Individual Therapy  Lezley Bedgood OTR/L 07/09/2020, 10:42 AM

## 2020-07-09 NOTE — Progress Notes (Signed)
Physical Therapy Session Note  Patient Details  Name: [name] MRN: [id number] Date of Birth: 1945-11-18  Today's Date: 07/09/2020 PT Individual Time: 1035-1105 PT Individual Time Calculation (min): 30 min   Short Term Goals: Week 1:  PT Short Term Goal 1 (Week 1): = to LTGs based on ELOS  Skilled Therapeutic Interventions/Progress Updates: Pt presented in w.c with husband present agreeable to therapy. Pt denies pain during session. Pt ambulated to day room without AD and CGA with verbal cues to increase L step length. Noted that day room was very full therefore ambulated to rehab gym in same manner. In gym pt attempted participation to step over hockey stick however pt unable to engage hamstrings in LLE to return to starting position. PTA removed hockey stick and pt was able to perform step forwards/backwards to target 2 x 10. Pt was able to consistently engage hamstring to flex knee in return foot to target. After seated rest pt ambulated back to room CGA with some improved step length on LLE. Pt returned to w/c at end of session and left with belt alarm on, call bell within reach and needs met.      Therapy Documentation Precautions:  Precautions Precautions: Fall, Other (comment) Precaution Comments: LUE and LLE hemiparesis with impaired coordination Required Braces or Orthoses: Other Brace Other Brace: L AFO Restrictions Weight Bearing Restrictions: No General:   Vital Signs: Therapy Vitals Temp: 97.9 F (36.6 C) Pulse Rate: 73 Resp: 18 BP: 130/86 Patient Position (if appropriate): Sitting Oxygen Therapy SpO2: 98 % O2 Device: Room Air Pain:     Therapy/Group: Individual Therapy  Amie Cowens  Nerida Boivin, PTA  07/09/2020, 4:13 PM

## 2020-07-09 NOTE — Progress Notes (Signed)
Patient ID: [name], female   DOB: 1946/09/15, 74 y.o.   MRN: [id number]  Team Conference Report to Patient/Family  Team Conference discussion was reviewed with the patient and caregiver, including goals, any changes in plan of care and target discharge date.  Patient and caregiver express understanding and are in agreement.  The patient has a target discharge date of 07/16/20.  [name] 07/09/2020, 1:44 PM

## 2020-07-09 NOTE — Patient Care Conference (Signed)
Inpatient RehabilitationTeam Conference and Plan of Care Update Date: 07/09/2020   Time: 10:57 AM    Patient Name: [name]      Medical Record Number: [id number]  Date of Birth: 20-May-1946 Sex: Female         Room/Bed: 4W17C/4W17C-01 Payor Info: Payor: MEDICARE / Plan: MEDICARE PART A AND B / Product Type: *No Product type* /    Admit Date/Time:  07/04/2020  3:03 PM  Primary Diagnosis:  Subcortical infarction [facility])  Hospital Problems: Principal Problem:   Subcortical infarction [facility])    Expected Discharge Date: Expected Discharge Date: 07/16/20  Team Members Present: Physician leading conference: Dr. Alysia Penna Care Coodinator Present: Dorien Chihuahua, RN, BSN, CRRN;Christina Sampson Goon, [place] Nurse Present: Rayne Du, LPN PT Present:  Estevan Ryder, PT) OT Present: Clyda Greener, OT SLP Present: Other (comment) Jenny Reichmann, [place]) PPS Coordinator present : Ileana Ladd, Burna Mortimer, SLP     Current Status/Progress Goal Weekly Team Focus  Bowel/Bladder   Pt is continent of b/b. LBM 8/30  pt will maintain continence of b/b  Q2h toileting/PRN   Swallow/Nutrition/ Hydration             ADL's             Mobility   min assist bed mobility, CGA/min assist sit<>stand and stand pivot transfers, gait up to 279ft no AD with min assist - continues to demo impaired L LE coordination due to spasticity  supervision overall at ambulatory level  bed mobility, transfers, gait training, L LE strengthening, L LE neuromuscular re-education, dynamic standing balance, pt education   Communication             Safety/Cognition/ Behavioral Observations            Pain   Pt c/o generalized aches and pain from therapy.  Pt will be pain free  Assess pain Qshift/PRN   Skin   No obvious signs of skin breakdown, Bruising to arms  Pt will remain free of breakdown and infection  Assess skin Qshift/PRN     Discharge Planning:  Goal to return home. Spouse avaliable to asisst in home    Team Discussion: Recent patient experiencing tone in left UE and dis-coordination on left side. Patient on target to meet rehab goals: yes, supervision goals  *See Care Plan and progress notes for long and short-term goals.   Revisions to Treatment Plan:  None  Teaching Needs: Transfer assistance, medications, diet, etc.  Current Barriers to Discharge: None  Possible Resolutions to Barriers:      Medical Summary Current Status: BP soft amlodipine reduced, sleeping well, no pain, skin intact  Barriers to Discharge: Medical stability   Possible Resolutions to Barriers/Weekly Focus: may need to make further BP med adjustments PTA   Continued Need for Acute Rehabilitation Level of Care: The patient requires daily medical management by a physician with specialized training in physical medicine and rehabilitation for the following reasons: Direction of a multidisciplinary physical rehabilitation program to maximize functional independence : Yes Medical management of patient stability for increased activity during participation in an intensive rehabilitation regime.: Yes Analysis of laboratory values and/or radiology reports with any subsequent need for medication adjustment and/or medical intervention. : Yes   I attest that I was present, lead the team conference, and concur with the assessment and plan of the team.   Dorien Chihuahua B 07/09/2020, 2:01 PM

## 2020-07-09 NOTE — Progress Notes (Signed)
ANTICOAGULATION CONSULT NOTE - Follow-Up Consult  Pharmacy Consult for Warfarin Indication: mechanical MVR  Allergies  Allergen Reactions  . Zoloft [Sertraline] Hives, Itching and Rash    Patient Measurements: Height: 5\' 7"  (170.2 cm) Weight: 59.2 kg (130 lb 8.2 oz) IBW/kg (Calculated) : 61.6  Vital Signs: Temp: 97.6 F (36.4 C) (09/01 0501) BP: 94/72 (09/01 0501) Pulse Rate: 95 (09/01 0501)  Labs: Recent Labs    07/07/20 0536 07/08/20 0612 07/09/20 0755  HGB 12.4  --   --   HCT 39.1  --   --   PLT 164  --   --   LABPROT 35.7* 34.3* 31.3*  INR 3.7* 3.5* 3.1*  CREATININE 0.80  --   --     Estimated Creatinine Clearance: 57.7 mL/min (by C-G formula based on SCr of 0.8 mg/dL).   Medical History: Past Medical History:  Diagnosis Date  . Abnormality of gait 09/07/2016  . Allergy   . Diabetes mellitus without complication [facility])    Patient denies this - notes history of glucose intolerance  . GERD (gastroesophageal reflux disease)   . Hemiparesis and alteration of sensations as late effects of stroke ([place]) 09/07/2016  . History of pneumonia 1997  . Hypertension   . S/P MVR (mitral valve replacement)    Mechanical mitral valve replacement at age 71 (done in [place])  // echo 7/17: EF 55-60, normal wall motion, bileaflet mechanical mitral valve prosthesis functioning normally, mild LAE, mildly reduced RVSF, small pericardial effusion  . Stroke [facility]) 1997, 2013, 2015    Medications:  See electronic med rec  Assessment: 73 yo female presented to [facility] ED on 07/01/20 with dizziness,  with acute ischemic stroke. PTA the patient is on warfarin for mechanical MVR and h/o hemispheric strokes. INR on admit 8/23 was therapeutic (3.2). Neurology agreed with continuing warfarin due benefits for heart valve outweigh due to low risk for hemorrhagic transformation with small stroke size.Last took prior to admission dose on 8/23.   No warfarin given on 3/24. Warfarin resumed 8/25.  Pharmacy  consulted to dose warfarin.  Patient was discharged form [facility] and transferred/admitted to CIR on 07/04/20  The patient's INR is 3.1 today,  therapeutic, goal 3-3.5  INR trend last 5 days: 2.8>3.2>3.7>3.5>3.1 CBC wnl stable on 8/30. No bleeding reported.  Meal intake continues at mostly 100%.  There are no contraindicated drug interactions identified except increased of bleeding with Aspirin 81 mg daily.   PTA dose: Warfarin PO 5 mg except 7.5 mg Mon and Fri and patient states that INR fluctuates at home. (pta last taken 8/23)  Goal of Therapy:  INR goal 3-3.5 Monitor platelets by anticoagulation protocol: Yes   Plan:  - Warfarin PO 7.5 mg x1 dose tonight - Obtain daily PT/INR, CBC on Mondays - Monitor for signs and symptoms of bleeding - Monitor drug interactions and oral intake   Nicole Cella, RPh Clinical Pharmacist [phone]  After 3:30 pm Please check AMION for all [place] phone numbers.  07/09/2020 11:20 AM

## 2020-07-10 ENCOUNTER — Inpatient Hospital Stay (HOSPITAL_COMMUNITY): Payer: Medicare Other | Admitting: Occupational Therapy

## 2020-07-10 ENCOUNTER — Inpatient Hospital Stay (HOSPITAL_COMMUNITY): Payer: Medicare Other | Admitting: Physical Therapy

## 2020-07-10 LAB — PROTIME-INR
INR: 3.4 — ABNORMAL HIGH (ref 0.8–1.2)
Prothrombin Time: 33.1 seconds — ABNORMAL HIGH (ref 11.4–15.2)

## 2020-07-10 MED ORDER — WARFARIN SODIUM 5 MG PO TABS
5.0000 mg | ORAL_TABLET | Freq: Once | ORAL | Status: AC
  Administered 2020-07-10: 5 mg via ORAL
  Filled 2020-07-10: qty 1

## 2020-07-10 NOTE — Progress Notes (Signed)
Physical Therapy Session Note  Patient Details  Name: [name] MRN: [id number] Date of Birth: 12-19-1945  Today's Date: 07/10/2020 PT Individual Time: 1300-1400 PT Individual Time Calculation (min): 60 min   Short Term Goals: Week 1:  PT Short Term Goal 1 (Week 1): = to LTGs based on ELOS  Skilled Therapeutic Interventions/Progress Updates:    pt received in [facility] and agreeable to therapy. Pt directed in STS from [facility] to RW at [facility] with extra time to  directed in gait training with RW for 200' CGA with VC for increased step height at LLE and step length. Pt reported she felt "stiff all over" and requested "something to loosen her up" before additional therapy. Pt directed to NuStep for 5 mins L2 with VC to encourage reciprocal BLE and BUE movement for improved gait technique. Pt denied pain and reported feeling better at end of this activity and directed to mat table with 20' gait with RW, CGA. Pt directed in seated alternating BLE/BUE movement with RLEand LLE etc. To improve reciprocal movements 3x10 each with frequent VC to assist pt in overall activity pattern. Pt directed in x10 squats to table in low height for improved glute and core strength at CGA as pt reported she felt a lot of difficulty standing from low surfaces. Pt directed in dual task gait training with RW with pt given list of x20 objects to find in gym to seek out and retrieve within good safety awareness, reaching in and outside of BOS, gait training, standing balance and cognition to increase duel task challenge, grossly CGA with RW however pt demonstrated frequent attempts to use furniture or walls to walk toward objects and lean against them to reach, educated on safety with this and to use walker to ambulate to object fully then retrieve it. Pt directed in gait training to return to room, RW 200' CGA with VC for increased step height on LLE and to remain inside walker. Once in room pt requested to use restroom and (+) bladder void  on toilet and able to complete self hygiene. Pt directed to sink for hand washing, CGA no AD for 5' then to bedside CGA, sit>supine CGA with extra time. Pt left in bed, alarm set, All needs in reach and in good condition. Call light in hand.    Therapy Documentation Precautions:  Precautions Precautions: Fall, Other (comment) Precaution Comments: LUE and LLE hemiparesis with impaired coordination Required Braces or Orthoses: Other Brace Other Brace: L AFO Restrictions Weight Bearing Restrictions: No       Therapy/Group: Individual Therapy  [name] 07/10/2020, 3:02 PM

## 2020-07-10 NOTE — Progress Notes (Signed)
[place] PHYSICAL MEDICINE & REHABILITATION PROGRESS NOTE   Subjective/Complaints:  Appreciate Neuropsych note Pt up in [place] this am at sink but states she did not know how to propel  ROS: Denies pain, constipation, insomnia  Objective:   No results found. No results for input(s): WBC, HGB, HCT, PLT in the last 72 hours. No results for input(s): NA, K, CL, CO2, GLUCOSE, BUN, CREATININE, CALCIUM in the last 72 hours.  Intake/Output Summary (Last 24 hours) at 07/10/2020 0847 Last data filed at 07/10/2020 0741 Gross per 24 hour  Intake 440 ml  Output --  Net 440 ml     Physical Exam: Vital Signs Blood pressure 114/60, pulse 71, temperature 98.6 F (37 C), resp. rate 16, height 5\' 7"  (1.702 m), weight 59.2 kg, SpO2 99 %.  General: No acute distress Mood and affect are appropriate Heart: Regular rate and rhythm no rubs murmurs or extra sounds Lungs: Clear to auscultation, breathing unlabored, no rales or wheezes Abdomen: Positive bowel sounds, soft nontender to palpation, nondistended Extremities: No clubbing, cyanosis, or edema Skin: No evidence of breakdown, no evidence of rash Neurologic: 5/5 on RIght side 4/5 on left except L ADF 3- LUE fine motor deficits, also with LUE dysdiadochokinesis  Psych: Pt's affect is appropriate. Pt is cooperative, has difficulty with problem solving how to propel WC   Assessment/Plan: 1. Functional deficits secondary to left frontal  infarcion which require 3+ hours per day of interdisciplinary therapy in a comprehensive inpatient rehab setting.  Physiatrist is providing close team supervision and 24 hour management of active medical problems listed below.  Physiatrist and rehab team continue to assess barriers to discharge/monitor patient progress toward functional and medical goals  Care Tool:  Bathing    Body parts bathed by patient: Left arm, Chest, Right arm, Left upper leg, Right lower leg, Left lower leg, Abdomen, Front perineal  area, Face, Buttocks, Right upper leg         Bathing assist Assist Level: Minimal Assistance - Patient > 75%     Upper Body Dressing/Undressing Upper body dressing   What is the patient wearing?: Hospital gown only    Upper body assist Assist Level: Independent    Lower Body Dressing/Undressing Lower body dressing      What is the patient wearing?: Underwear/pull up     Lower body assist Assist for lower body dressing: Independent     Toileting Toileting    Toileting assist Assist for toileting: Moderate Assistance - Patient 50 - 74%     Transfers Chair/bed transfer  Transfers assist     Chair/bed transfer assist level: Supervision/Verbal cueing     Locomotion Ambulation   Ambulation assist      Assist level: Contact Guard/Touching assist Assistive device: No Device Max distance: 181ft   Walk 10 feet activity   Assist     Assist level: Contact Guard/Touching assist Assistive device: No Device   Walk 50 feet activity   Assist    Assist level: Contact Guard/Touching assist Assistive device: No Device    Walk 150 feet activity   Assist    Assist level: Contact Guard/Touching assist Assistive device: No Device    Walk 10 feet on uneven surface  activity   Assist     Assist level: Minimal Assistance - Patient > 75% Assistive device: Other (comment) (using railing support)   Wheelchair     Assist Will patient use wheelchair at discharge?: No  Wheelchair 50 feet with 2 turns activity    Assist            Wheelchair 150 feet activity     Assist          Blood pressure 114/60, pulse 71, temperature 98.6 F (37 C), resp. rate 16, height 5\' 7"  (1.702 m), weight 59.2 kg, SpO2 99 %.   Medical Problem List and Plan: 1.  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  Antithrombotics: -DVT/anticoagulation: Chronic Coumadin for history of MVR.  Goal INR 3.0-3.5.  INR is 3.2 on 8/29, 3.7 on 8/30 , 3.5 8/31 pending today              -antiplatelet therapy: Aspirin 81 mg daily 3. Pain Management: Tylenol as needed. Well controlled 4. Mood: Advised emotional support             -antipsychotic agents: N/A 5. Neuropsych: This patient is capable of making decisions on her own behalf. Cognition impaired in respect to problem solving for WC use, ? apraxic 6. Skin/Wound Care: Routine skin checks. May remove IV 8/27. 7. Fluids/Electrolytes/Nutrition: Routine in and outs with follow-up chemistries 8.  Hypertension.  Norvasc 10 mg daily.  Monitor with increased mobility.  Norvasc to 5mg .  Vitals:   07/09/20 2017 07/10/20 0451  BP: 110/66 114/60  Pulse: 71 71  Resp: 16 16  Temp: 97.9 F (36.6 C) 98.6 F (37 C)  SpO2: 96% 99%  controlled , monitor on current meds  9.  Hyperlipidemia.  Lipitor 10.  History of mitral valve replacement.  INR goal 3-3.5 INR pending     11.  Prediabetes.  Hemoglobin A1c 6.1.  Blood sugar checks discontinued 12.  GERD.  Protonix  LOS: 6 days A FACE TO FACE EVALUATION WAS PERFORMED  Charlett Blake 07/10/2020, 8:47 AM

## 2020-07-10 NOTE — Progress Notes (Signed)
Patient ID: [name], female   DOB: 1946/04/26, 74 y.o.   MRN: [id number] Follow up with the patient regarding questions she had about HTN management. Reported her husband has a blood pressure monitor on order for discharge. She notes each stroke has been different but she wants to do anything and everything to avoid another stroke. Reviewed taking medications regularly and monitoring BP levels. Also reviewed when to check her BP and when to notify the physician. States an understanding of the information reviewed. Margarito Liner

## 2020-07-10 NOTE — Progress Notes (Signed)
Occupational Therapy Session Note  Patient Details  Name: [name] MRN: [id number] Date of Birth: [birth year]  Today's Date: 07/10/2020 OT Individual Time: 7473-4037 OT Individual Time Calculation (min): 38 min    Short Term Goals: Week 1:  OT Short Term Goal 1 (Week 1): STGs equal to LTGS set at supervision level overall.  Skilled Therapeutic Interventions/Progress Updates:    Pt sitting at the sink finishing washing her face and hands to start session.  Worked on dressing tasks sit to stand from the wheelchair.  She was able to complete donning her bra with increased time and supervision and then her pullover shirt.  She was then able to complete donning LB clothing at min assist level.  Underpants and pants required min assist with sit to stand while socks, shoes, and left AFO were completed with supervision.  Finished session with work on LUE strengthening by attempting to push the wheelchair.  Mod assist was needed for pushing and then re-gripping with the LUE secondary to increased tone in the left elbow flexors as well wrist flexors.  Decreased ability to motor plan activity as well.  Finished session with pt in the wheelchair with the call button and phone in reach and pt's spouse in the room as well.   Therapy Documentation Precautions:  Precautions Precautions: Fall, Other (comment) Precaution Comments: LUE and LLE hemiparesis with impaired coordination Required Braces or Orthoses: Other Brace Other Brace: L AFO Restrictions Weight Bearing Restrictions: No   Pain: Pain Assessment Pain Scale: Faces Pain Score: 0-No pain ADL: See Care Tool Section for some details of mobility and selfcare  Therapy/Group: Individual Therapy  [name] OTR/L 07/10/2020, 10:41 AM

## 2020-07-10 NOTE — Progress Notes (Signed)
ANTICOAGULATION CONSULT NOTE - Follow-Up Consult  Pharmacy Consult for Warfarin Indication: mechanical MVR  Allergies  Allergen Reactions  . Zoloft [Sertraline] Hives, Itching and Rash    Patient Measurements: Height: 5\' 7"  (170.2 cm) Weight: 59.2 kg (130 lb 8.2 oz) IBW/kg (Calculated) : 61.6  Vital Signs: Temp: 98.6 F (37 C) (09/02 0451) BP: 114/60 (09/02 0451) Pulse Rate: 71 (09/02 0451)  Labs: Recent Labs    07/08/20 0612 07/09/20 0755 07/10/20 0717  LABPROT 34.3* 31.3* 33.1*  INR 3.5* 3.1* 3.4*    Estimated Creatinine Clearance: 57.7 mL/min (by C-G formula based on SCr of 0.8 mg/dL).   Medical History: Past Medical History:  Diagnosis Date  . Abnormality of gait 09/07/2016  . Allergy   . Diabetes mellitus without complication [facility])    Patient denies this - notes history of glucose intolerance  . GERD (gastroesophageal reflux disease)   . Hemiparesis and alteration of sensations as late effects of stroke ([place]) 09/07/2016  . History of pneumonia 1997  . Hypertension   . S/P MVR (mitral valve replacement)    Mechanical mitral valve replacement at age 34 (done in [place])  // echo 7/17: EF 55-60, normal wall motion, bileaflet mechanical mitral valve prosthesis functioning normally, mild LAE, mildly reduced RVSF, small pericardial effusion  . Stroke [facility]) 1997, 2013, 2015    Medications:  See electronic med rec  Assessment: 74 yo female presented to [facility] ED on 07/01/20 with dizziness,  with acute ischemic stroke. PTA the patient is on warfarin for mechanical MVR and h/o hemispheric strokes. INR on admit 8/23 was therapeutic (3.2). Neurology agreed with continuing warfarin due benefits for heart valve outweigh due to low risk for hemorrhagic transformation with small stroke size.Last took prior to admission dose on 8/23.   No warfarin given on 3/24. Warfarin resumed 8/25. Pharmacy  consulted to dose warfarin.  Patient was discharged form [facility] and transferred/admitted  to CIR on 07/04/20  The patient's INR is 3.4 today,  therapeutic, goal 3-3.5 CBC within normal / stable on 8/30. No bleeding reported.  Good meal intake. There are no contraindicated drug interactions identified except increased of bleeding with Aspirin 81 mg daily.   PTA dose: Warfarin PO 5 mg except 7.5 mg Mon and Fri and patient states that INR fluctuates at home. (pta last taken 8/23)  Goal of Therapy:  INR goal 3-3.5 Monitor platelets by anticoagulation protocol: Yes   Plan:  Warfarin 5 mg x1 tonight , if INR remains therapeutic on 9/3, consider resuming home dose and change INR to every other day.  - Obtain daily PT/INR, CBC on Mondays - Monitor for signs and symptoms of bleeding - Monitor drug interactions and oral intake   Nicole Cella, RPh Clinical Pharmacist [phone]  After 3:30 pm Please check AMION for all [place] phone numbers.  07/10/2020 10:47 AM

## 2020-07-10 NOTE — Progress Notes (Signed)
Physical Therapy Session Note  Patient Details  Name: [name] MRN: [id number] Date of Birth: 1946/10/08  Today's Date: 07/10/2020 PT Individual Time: 1033-1130 PT Individual Time Calculation (min): 57 min   Short Term Goals: Week 1:  PT Short Term Goal 1 (Week 1): = to LTGs based on ELOS  Skilled Therapeutic Interventions/Progress Updates: Pt presents sitting in w/c and agreeable to therapy.  Pt wheeled to gym for time and energy conservation.  Pt performed standing using UBE at level 2 w/ LLE on base and RLE on floor.  Pt performed x 4' w/ encouragement for posture.  Pt performed standing [employer] on cushioned surface w/ upper quadrants only.  Pt encouraged to use LUE for "middle rows" requiring increased time.  Pt negotiated through cones for obstacle course, as well as stepping over cane/QC on floor w/ CGA to min A, but verbal cues for speed and sequencing.  Pt performed multiple sit to stand transfers w/ CGA.  Pt amb multiple trials w/o AD and CGA w/ 2# weight to LLE for improved placement.  Pt amb up to 150' including turns to return to seat.  Verbal cueing given for visual scanning and then improved step length and placement.  Pt returned to room and chair alarm on w/ all needs in reach.     Therapy Documentation Precautions:  Precautions Precautions: Fall, Other (comment) Precaution Comments: LUE and LLE hemiparesis with impaired coordination Required Braces or Orthoses: Other Brace Other Brace: L AFO Restrictions Weight Bearing Restrictions: No General:   Vital Signs:   Pain:no c/o Pain Assessment Pain Scale: Faces Pain Score: 0-No pain Mobility:      Therapy/Group: Individual Therapy  [name] 07/10/2020, 1:02 PM

## 2020-07-11 ENCOUNTER — Inpatient Hospital Stay (HOSPITAL_COMMUNITY): Payer: Medicare Other | Admitting: Physical Therapy

## 2020-07-11 ENCOUNTER — Inpatient Hospital Stay (HOSPITAL_COMMUNITY): Payer: Medicare Other | Admitting: Occupational Therapy

## 2020-07-11 LAB — PROTIME-INR
INR: 3.3 — ABNORMAL HIGH (ref 0.8–1.2)
Prothrombin Time: 32.1 seconds — ABNORMAL HIGH (ref 11.4–15.2)

## 2020-07-11 MED ORDER — WARFARIN SODIUM 7.5 MG PO TABS
7.5000 mg | ORAL_TABLET | ORAL | Status: DC
  Administered 2020-07-11 – 2020-07-14 (×2): 7.5 mg via ORAL
  Filled 2020-07-11 (×2): qty 1

## 2020-07-11 MED ORDER — WARFARIN SODIUM 5 MG PO TABS
5.0000 mg | ORAL_TABLET | ORAL | Status: DC
  Administered 2020-07-12 – 2020-07-15 (×3): 5 mg via ORAL
  Filled 2020-07-11 (×3): qty 1

## 2020-07-11 NOTE — Progress Notes (Signed)
ANTICOAGULATION CONSULT NOTE - Follow-Up Consult  Pharmacy Consult for Warfarin Indication: mechanical MVR  Allergies  Allergen Reactions  . Zoloft [Sertraline] Hives, Itching and Rash    Patient Measurements: Height: 5\' 7"  (170.2 cm) Weight: 59.2 kg (130 lb 8.2 oz) IBW/kg (Calculated) : 61.6  Vital Signs: Temp: 97.9 F (36.6 C) (09/03 0444) BP: 121/74 (09/03 0444) Pulse Rate: 73 (09/03 0444)  Labs: Recent Labs    07/09/20 0755 07/10/20 0717 07/11/20 1011  LABPROT 31.3* 33.1* 32.1*  INR 3.1* 3.4* 3.3*    Estimated Creatinine Clearance: 57.7 mL/min (by C-G formula based on SCr of 0.8 mg/dL).   Medical History: Past Medical History:  Diagnosis Date  . Abnormality of gait 09/07/2016  . Allergy   . Diabetes mellitus without complication [facility])    Patient denies this - notes history of glucose intolerance  . GERD (gastroesophageal reflux disease)   . Hemiparesis and alteration of sensations as late effects of stroke ([place]) 09/07/2016  . History of pneumonia 1997  . Hypertension   . S/P MVR (mitral valve replacement)    Mechanical mitral valve replacement at age 42 (done in [place])  // echo 7/17: EF 55-60, normal wall motion, bileaflet mechanical mitral valve prosthesis functioning normally, mild LAE, mildly reduced RVSF, small pericardial effusion  . Stroke [facility] [place] [facility]) 1997, 2013, 2015    Medications:  See electronic med rec  Assessment: 74 yo female presented to [facility] ED on 07/01/20 with dizziness,  with acute ischemic stroke. PTA the patient is on warfarin for mechanical MVR and h/o hemispheric strokes. INR on admit 8/23 was therapeutic (3.2). Neurology agreed with continuing warfarin due benefits for heart valve outweigh due to low risk for hemorrhagic transformation with small stroke size.Last took prior to admission dose on 8/23.   No warfarin given on 3/24. Warfarin resumed 8/25. Pharmacy  consulted to dose warfarin.  Patient was discharged form [facility] and  transferred/admitted to CIR on 07/04/20  The patient's INR is 3.3 today,   remains therapeutic the last 6 out of 7 days, Goal INR  3-3.5 for mechanical MVR and h/o hemispheric strokes. . No bleeding reported.  On 8/30 the CBC remained within normal / stable.  Continues with good meal intake. There are no contraindicated drug interactions identified except increased of bleeding with Aspirin 81 mg daily.    PTA dose: Warfarin PO 5 mg except 7.5 mg Mon and Fri and patient states that INR fluctuates at home. (pta last taken 8/23)  Goal of Therapy:  INR goal 3-3.5 Monitor platelets by anticoagulation protocol: Yes   Plan:  Warfarin 5 mg daily except 7.5 mg every Mon and Friday (resuming PTA home dosage regimen)  Check INR every Mon, Wed Fri.   CBC every Monday - Monitor for signs and symptoms of bleeding - Monitor drug interactions and oral intake   Nicole Cella, RPh Clinical Pharmacist [phone]  After 3:30 pm Please check AMION for all [place] phone numbers.  07/11/2020 1:31 PM

## 2020-07-11 NOTE — Progress Notes (Signed)
Occupational Therapy Weekly Progress Note  Patient Details  Name: [name] MRN: [id number] Date of Birth: 06/29/1946  Beginning of progress report period: July 05, 2020 End of progress report period: July 11, 2020  Today's Date: 07/11/2020 OT Individual Time: 7672-0947 OT Individual Time Calculation (min): 44 min    Pt is making steady progress with OT at this time.  She is able to complete UB selfcare with supervision level.  LB selfcare is completed at min guard assist sit to stand as well.  Transfers are min assist to min guard without use of a walker and min guard with use of the RW.  She continues to exhibit increased LUE tone at the elbow and wrist from previous CVA.  However, she is able to use it at a diminished level with selfcare tasks.  She does need min to mod instructional cueing to integrate it at times however to integrate it, as she tends to rely on the dominant RUE most of the time.  Feel she is approaching her goal level of supervision at this time with anticipated discharge on 07/16/20.  Recommend continued CIR level therapy until discharge.    Patient continues to demonstrate the following deficits: muscle weakness, unbalanced muscle activation and decreased coordination and decreased standing balance, hemiplegia and decreased balance strategies and therefore will continue to benefit from skilled OT intervention to enhance overall performance with BADL and iADL.  Patient progressing toward long term goals..  Continue plan of care.  OT Short Term Goals Week 2:  OT Short Term Goal 1 (Week 2): STGs equal to LTGS set at supervision level overall.  Skilled Therapeutic Interventions/Progress Updates:    Pt worked on bathing and dressing sit to stand at the sink during session.  She was able to transfer from the bed to the wheelchair with min guard assist using the RW for support.  She then completed a brief bath washing her front and back peri area as well as under her  arms with min guard assist.  She completed all UB dressing at supervision level as well as LB dressing with min guard assist including donning AFO and shoes.  Mod instructional cueing was needed for her to place the LUE on the chair for weightbearing with sit to stand.  Finished session with completion of oral care with supervision.  She was left sitting up in the wheelchair with her spouse present and with the call button and phone in reach.    Therapy Documentation Precautions:  Precautions Precautions: Fall, Other (comment) Precaution Comments: LUE and LLE hemiparesis with impaired coordination Required Braces or Orthoses: Other Brace Other Brace: L AFO Restrictions Weight Bearing Restrictions: No  Pain: Pain Assessment Pain Scale: Faces Pain Score: 0-No pain ADL: See Care Tool Section for some details of mobility and selfcare  Therapy/Group: Individual Therapy  Tonjia Parillo OTR/L 07/11/2020, 3:46 PM

## 2020-07-11 NOTE — Progress Notes (Signed)
Physical Therapy Weekly Progress Note  Patient Details  Name: [name] MRN: [id number] Date of Birth: Aug 14, 1946  Beginning of progress report period: July 05, 2020 End of progress report period: July 11, 2020  Today's Date: 07/11/2020 PT Individual Time: 9470-9628 and 3662-9476 PT Individual Time Calculation (min): 74 min and 73 min  Patient has met 0 of 1 short term goals due to none being set at evaluation based on original ELOS.  [name] is progressing well with therapy demonstrating increasing independence with functional mobility tasks. She is performing supine<>sit independently, sit<>stand and stand pivot transfers with CGA, ambulating up to 234ft without AD with CGA. She continues to demonstrate L LE paresis with impaired tone/spascitity likely contributing to impaired L LE coordination during ambulation though patient demonstrates good compensatory strategies using R hemibody to maintain balance and increase independence with mobility.   Patient continues to demonstrate the following deficits muscle weakness, decreased cardiorespiratoy endurance, impaired timing and sequencing, abnormal tone, unbalanced muscle activation and decreased coordination and decreased standing balance, decreased postural control and decreased balance strategies and therefore will continue to benefit from skilled PT intervention to increase functional independence with mobility.  Patient progressing toward long term goals..  Continue plan of care.  PT Short Term Goals Week 1:  PT Short Term Goal 1 (Week 1): = to LTGs based on ELOS PT Short Term Goal 1 - Progress (Week 1): Progressing toward goal Week 2:  PT Short Term Goal 1 (Week 2): = to LTGs based on ELOS  Skilled Therapeutic Interventions/Progress Updates:  Ambulation/gait training;Community reintegration;DME/adaptive equipment instruction;Neuromuscular re-education;Psychosocial support;Stair training;UE/LE Strength  taining/ROM;Balance/vestibular training;Discharge planning;Functional electrical stimulation;Pain management;Skin care/wound management;Therapeutic Activities;UE/LE Coordination activities;Cognitive remediation/compensation;Disease management/prevention;Functional mobility training;Patient/family education;Splinting/orthotics;Therapeutic Exercise;Visual/perceptual remediation/compensation   Session 1: Pt received sitting in w/c with her husband present and pt agreeable to therapy session. Pt wearing personal L LE AFO throughout session. Functional sit<>stands with pt using R hemibody to compensate for L hemiparesis with CGA - when forcing use of L hemibody and improved L LE alignment required heavy min assist for lifting and balance. Gait training ~17ft to day room, no AD, with CGA for steadying - demos very slow gait speed, decreased B LE step lengths and foot clearances (L more impaired), mild L LE circumduction during swing with insufficient hip/knee flexion. Stepped on/off treadmill using UE support on bars, no harness, with CGA/min assist for steadying. Donned litegait harness. Locomotor treadmill training using litegait harness for safety (no BWS) for 54minutes at primarily 1.60mph totaling 69ft - pt started with B UE support on bars and therapist using bean bags as visual targets for pt to increase L hip/knee flexion during swing to clear foot and to promote improved step length with pt demoing improvement - pt able to ambulate at faster gait speed on treadmill as well. Pt able to progress to ambulating without UE support on treadmill but decreased speed to 0.38mph to allow increased focus on gait mechanics for ~3 of the total 8 minutes. Doffed harness and stepped off treadmill as described above. Gait training ~289ft to main therapy gym, no AD, with CGA - demos increased gait speed, increased step lengths and increased L LE foot clearance. Dynamic gait training, no UE support, performing R/L lateral side  stepping, backwards walking, and sudden turns with progression to varying between each of these - demos difficulty R lateral stepping due to impaired L LE coordination when stepping in towards R foot and demos increased difficulty when turning R and backwards. Dynamic  standing balance tasks of R/L lateral stepping over hockey stick, anterior/posteiror stepping over hockey sticks, progressed to 4 square stepping over hockey sticks - demos most difficulty when stepping backwards over hockey stick leading with R LE due to lack of R posterior weight shift to lift L LE. Gait training ~171ft back to room, no AD, with CGA - continues to have improved gait speed but due to fatigue demoing increased L LE circumduction during swing. Sit>supine independently. Pt left supine in bed with needs in reach and her husband present.  Session 2: Pt received sitting supported in bed and agreeable to therapy session. Scoot to R EOB independently. Sit<>stands, no AD, with CGA progressing to supervision for safety during session. Gait training ~145ft to main therapy gym using [place] in R hand - demos no significant gait changes using SPC vs no AD - continues to have decreased gait speed though improved from beginning of 1st session and has improving L LE step length but with continued slight circumduction during swing. Gait speed 2 trials using SPC in R hand: 0.55m/s and  0.45m/s with close supervision for safety. Patient participated in [facility] and demonstrates increased fall risk as noted by score of   31/56.  (<36= high risk for falls, close to 100%; 37-45 significant >80%; 46-51 moderate >50%; 52-55 lower >25%). Pt educated on results of test and increased fall risk. Pt demos most difficulty with items that require narrow BOS or standing on R LE due to lack of adequate R weight shift while sustaining R hip abductor and extensor activation - repeated some of these items for improved balance. Repeated sit<>stands to/from EOM  with R HHA progressed to no UE support x15 reps focusing on improved L knee alignment via increased hip abductor activation - min assist for balance with a few occasions of mod assist due to posterior lean/LOB - cuing for increased hip extension coming to stand and increased trunk flexion going to sit. Alternate B LE foot taps on 6" step, no UE support, with min assist for balance - cuing for increased R weight shift while avoiding L lateral trunk flexion - demos L LE muscle fatigue with slight knee hyperextension during stance for R foot to tap. Dynamic standing balance and L UE NMR task via turning to place clothespin from low to high surface onto basketball net - required CGA for steadying throughout but no instances of significant unsteadiness or LOB. Gait training ~126ft back to room using [place] in R hand with CGA for safety - demos increased L LE circumduction with decreased hip/knee flexion during swing due to fatigue. Sit>supine mod-I using bed features. Left supine in bed with needs in reach and bed alarm on.  Balance: Standardized Balance Assessment Standardized Balance Assessment: Berg Balance Test Berg Balance Test Sit to Stand: Able to stand  independently using hands Standing Unsupported: Able to stand 2 minutes with supervision Sitting with Back Unsupported but Feet Supported on Floor or Stool: Able to sit safely and securely 2 minutes Stand to Sit: Sits safely with minimal use of hands Transfers: Able to transfer with verbal cueing and /or supervision Standing Unsupported with Eyes Closed: Able to stand 10 seconds with supervision Standing Ubsupported with Feet Together: Able to place feet together independently and stand for 1 minute with supervision From Standing, Reach Forward with Outstretched Arm: Can reach forward >12 cm safely (5") From Standing Position, Pick up Object from Floor: Able to pick up shoe, needs supervision From Standing Position, Turn  to Look Behind Over each  Shoulder: Needs supervision when turning Turn 360 Degrees: Needs close supervision or verbal cueing Standing Unsupported, Alternately Place Feet on Step/Stool: Needs assistance to keep from falling or unable to try (continues to have poor motor planning to weight shift onto R LE and lift L LE) Standing Unsupported, One Foot in Front: Needs help to step but can hold 15 seconds Standing on One Leg: Unable to try or needs assist to prevent fall Total Score: 31    Therapy Documentation Precautions:  Precautions Precautions: Fall, Other (comment) Precaution Comments: LUE and LLE hemiparesis with impaired coordination Required Braces or Orthoses: Other Brace Other Brace: L AFO Restrictions Weight Bearing Restrictions: No  Pain:   Session 1: No reports of pain throughout session.  Session 2: Reports fatigue from therapy sessions today but denies pain.  Therapy/Group: Individual Therapy  Tawana Scale , PT, DPT, CSRS  07/11/2020, 7:59 AM

## 2020-07-11 NOTE — Progress Notes (Signed)
[place] PHYSICAL MEDICINE & REHABILITATION PROGRESS NOTE   Subjective/Complaints:   ROS: Denies pain, constipation, insomnia  Objective:   No results found. No results for input(s): WBC, HGB, HCT, PLT in the last 72 hours. No results for input(s): NA, K, CL, CO2, GLUCOSE, BUN, CREATININE, CALCIUM in the last 72 hours.  Intake/Output Summary (Last 24 hours) at 07/11/2020 0812 Last data filed at 07/11/2020 8938 Gross per 24 hour  Intake 920 ml  Output --  Net 920 ml     Physical Exam: Vital Signs Blood pressure 121/74, pulse 73, temperature 97.9 F (36.6 C), resp. rate 18, height 5\' 7"  (1.702 m), weight 59.2 kg, SpO2 97 %.   General: No acute distress Mood and affect are appropriate Heart: Regular rate and rhythm no rubs murmurs or extra sounds Lungs: Clear to auscultation, breathing unlabored, no rales or wheezes Abdomen: Positive bowel sounds, soft nontender to palpation, nondistended Extremities: No clubbing, cyanosis, or edema Skin: No evidence of breakdown, no evidence of rash  Neurologic: 5/5 on RIght side 4/5 on left except L ADF 3- LUE fine motor deficits, also with LUE dysdiadochokinesis  Psych: Pt's affect is appropriate. Pt is cooperative, has difficulty with problem solving how to propel WC   Assessment/Plan: 1. Functional deficits secondary to left frontal  infarction which require 3+ hours per day of interdisciplinary therapy in a comprehensive inpatient rehab setting.  Physiatrist is providing close team supervision and 24 hour management of active medical problems listed below.  Physiatrist and rehab team continue to assess barriers to discharge/monitor patient progress toward functional and medical goals  Care Tool:  Bathing    Body parts bathed by patient: Left arm, Chest, Right arm, Left upper leg, Right lower leg, Left lower leg, Abdomen, Front perineal area, Face, Buttocks, Right upper leg         Bathing assist Assist Level: Minimal  Assistance - Patient > 75%     Upper Body Dressing/Undressing Upper body dressing   What is the patient wearing?: Bra, Pull over shirt    Upper body assist Assist Level: Set up assist    Lower Body Dressing/Undressing Lower body dressing      What is the patient wearing?: Underwear/pull up, Pants     Lower body assist Assist for lower body dressing: Minimal Assistance - Patient > 75%     Toileting Toileting    Toileting assist Assist for toileting: Moderate Assistance - Patient 50 - 74%     Transfers Chair/bed transfer  Transfers assist     Chair/bed transfer assist level: Supervision/Verbal cueing     Locomotion Ambulation   Ambulation assist      Assist level: Contact Guard/Touching assist Assistive device: No Device Max distance: 130ft   Walk 10 feet activity   Assist     Assist level: Contact Guard/Touching assist Assistive device: No Device   Walk 50 feet activity   Assist    Assist level: Contact Guard/Touching assist Assistive device: No Device    Walk 150 feet activity   Assist    Assist level: Contact Guard/Touching assist Assistive device: No Device    Walk 10 feet on uneven surface  activity   Assist     Assist level: Minimal Assistance - Patient > 75% Assistive device: Other (comment) (using railing support)   Wheelchair     Assist Will patient use wheelchair at discharge?: No             Wheelchair 50 feet with 2 turns  activity    Assist            Wheelchair 150 feet activity     Assist      Assist Level: Minimal Assistance - Patient > 75%   Blood pressure 121/74, pulse 73, temperature 97.9 F (36.6 C), resp. rate 18, height 5\' 7"  (1.702 m), weight 59.2 kg, SpO2 97 %.   Medical Problem List and Plan: 1.  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  Antithrombotics: -DVT/anticoagulation: Chronic Coumadin for history of MVR.  Goal INR 3.0-3.5.  INR is 3.2 on 8/29, 3.7 on 8/30 , 3.5 8/31 pending today              -antiplatelet therapy: Aspirin 81 mg daily 3. Pain Management: Tylenol as needed. Well controlled 4. Mood: Advised emotional support             -antipsychotic agents: N/A 5. Neuropsych: This patient is capable of making decisions on her own behalf. Cognition impaired in respect to problem solving for WC use, ? apraxic 6. Skin/Wound Care: Routine skin checks. May remove IV 8/27. 7. Fluids/Electrolytes/Nutrition: Routine in and outs with follow-up chemistries 8.  Hypertension.  Norvasc 10 mg daily.  Monitor with increased mobility.  Norvasc to 5mg .  Vitals:   07/10/20 1940 07/11/20 0444  BP: 106/69 121/74  Pulse: 68 73  Resp: 16 18  Temp: 97.7 F (36.5 C) 97.9 F (36.6 C)  SpO2: 98% 97%  controlled , monitor on current meds  9.  Hyperlipidemia.  Lipitor 10.  History of mitral valve replacement.  INR goal 3-3.5 INR pending     11.  Prediabetes.  Hemoglobin A1c 6.1.  Blood sugar checks discontinued 12.  GERD.  Protonix  LOS: 7 days A FACE TO FACE EVALUATION WAS PERFORMED  Charlett Blake 07/11/2020, 8:12 AM

## 2020-07-12 ENCOUNTER — Inpatient Hospital Stay (HOSPITAL_COMMUNITY): Payer: Medicare Other | Admitting: Physical Therapy

## 2020-07-12 ENCOUNTER — Inpatient Hospital Stay (HOSPITAL_COMMUNITY): Payer: Medicare Other

## 2020-07-12 NOTE — Progress Notes (Signed)
[place] PHYSICAL MEDICINE & REHABILITATION PROGRESS NOTE   Subjective/Complaints:   Pt reports slept well- LBM yesterday- husband in room- asking about HEP for home and therapy outpatinet- I explained that will be addressed during the week.   Got her RW   ROS:  Pt denies SOB, abd pain, CP, N/V/C/D, and vision changes   Objective:   No results found. No results for input(s): WBC, HGB, HCT, PLT in the last 72 hours. No results for input(s): NA, K, CL, CO2, GLUCOSE, BUN, CREATININE, CALCIUM in the last 72 hours.  Intake/Output Summary (Last 24 hours) at 07/12/2020 1524 Last data filed at 07/12/2020 1327 Gross per 24 hour  Intake 558 ml  Output 1 ml  Net 557 ml     Physical Exam: Vital Signs Blood pressure 108/66, pulse 62, temperature 98.1 F (36.7 C), resp. rate 17, height 5\' 7"  (1.702 m), weight 59.2 kg, SpO2 99 %.   General: No acute distress- sitting up in bed- husband in room; slightly nonfluent speech noted, NAD Mood and affect are appropriate Heart: RRR Lungs: CTA B/L- no W/R/R- good air movement Abdomen: Soft, NT, ND, (+)BS  Extremities: No clubbing, cyanosis, or edema Skin: No evidence of breakdown, no evidence of rash- a lot of bruising- brownish color on UEs from IV/blood draws Neurologic: 5/5 on RIght side 4/5 on left except L ADF 3- LUE fine motor deficits, also with LUE dysdiadochokinesis  Psych: Pt's affect is appropriate. Pt is cooperative, has difficulty with problem solving how to propel WC   Assessment/Plan: 1. Functional deficits secondary to left frontal  infarction which require 3+ hours per day of interdisciplinary therapy in a comprehensive inpatient rehab setting.  Physiatrist is providing close team supervision and 24 hour management of active medical problems listed below.  Physiatrist and rehab team continue to assess barriers to discharge/monitor patient progress toward functional and medical goals  Care Tool:  Bathing    Body parts  bathed by patient: Left arm, Chest, Right arm, Left upper leg, Right lower leg, Left lower leg, Abdomen, Front perineal area, Face, Buttocks, Right upper leg         Bathing assist Assist Level: Minimal Assistance - Patient > 75%     Upper Body Dressing/Undressing Upper body dressing   What is the patient wearing?: Bra, Pull over shirt    Upper body assist Assist Level: Set up assist    Lower Body Dressing/Undressing Lower body dressing      What is the patient wearing?: Underwear/pull up, Pants     Lower body assist Assist for lower body dressing: Contact Guard/Touching assist     Toileting Toileting    Toileting assist Assist for toileting: Moderate Assistance - Patient 50 - 74%     Transfers Chair/bed transfer  Transfers assist     Chair/bed transfer assist level: Contact Guard/Touching assist     Locomotion Ambulation   Ambulation assist      Assist level: Contact Guard/Touching assist Assistive device: No Device Max distance: 266ft   Walk 10 feet activity   Assist     Assist level: Contact Guard/Touching assist Assistive device: No Device   Walk 50 feet activity   Assist    Assist level: Contact Guard/Touching assist Assistive device: No Device    Walk 150 feet activity   Assist    Assist level: Contact Guard/Touching assist Assistive device: No Device    Walk 10 feet on uneven surface  activity   Assist  Assist level: Minimal Assistance - Patient > 75% Assistive device: Other (comment) (using railing support)   Wheelchair     Assist Will patient use wheelchair at discharge?: No             Wheelchair 50 feet with 2 turns activity    Assist            Wheelchair 150 feet activity     Assist      Assist Level: Minimal Assistance - Patient > 75%   Blood pressure 108/66, pulse 62, temperature 98.1 F (36.7 C), resp. rate 17, height 5\' 7"  (1.702 m), weight 59.2 kg, SpO2 99 %.   Medical  Problem List and Plan: 1.  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  Antithrombotics: -DVT/anticoagulation: Chronic Coumadin for history of MVR.  Goal INR 3.0-3.5.  INR is 3.2 on 8/29, 3.7 on 8/30 , 3.5 8/31 pending today   9/4- INR 3.3 yesterday late             -antiplatelet therapy: Aspirin 81 mg daily 3. Pain Management: Tylenol as needed.   9/4- well controlled with tylenol- con't regimen prn 4. Mood: Advised emotional support             -antipsychotic agents: N/A 5. Neuropsych: This patient is capable of making decisions on her own behalf. Cognition impaired in respect to problem solving for WC use, ? Apraxic  9/4- speech is slightly nonfluent and word searching some- also couldn't remember some basic words- rolling walker, w/c, the equipment she's going to go home with- verbal apraxia??? 6. Skin/Wound Care: Routine skin checks. May remove IV 8/27. 7. Fluids/Electrolytes/Nutrition: Routine in and outs with follow-up chemistries 8.  Hypertension.  Norvasc 10 mg daily.  Monitor with increased mobility.  Norvasc to 5mg .  Vitals:   07/12/20 1441 07/12/20 1522  BP: 118/77 108/66  Pulse: 75 62  Resp: 16 17  Temp: 97.9 F (36.6 C) 98.1 F (36.7 C)  SpO2: 97% 99%  9/4- BP well controlled- con't meds 9.  Hyperlipidemia.  Lipitor 10.  History of mitral valve replacement.  INR goal 3-3.5 INR pending   9/4- INR 3.3    11.  Prediabetes.  Hemoglobin A1c 6.1.  Blood sugar checks discontinued 12.  GERD.  Protonix  LOS: 8 days A FACE TO FACE EVALUATION WAS PERFORMED  Evani Shrider 07/12/2020, 3:24 PM

## 2020-07-12 NOTE — Progress Notes (Signed)
Occupational Therapy Session Note  Patient Details  Name: JADY BRAGGS MRN: [id number] Date of Birth: 18-Mar-1946  Today's Date: 07/12/2020 OT Individual Time: 2751-7001 OT Individual Time Calculation (min): 59 min    Short Term Goals: Week 1:  OT Short Term Goal 1 (Week 1): STGs equal to LTGS set at supervision level overall.  Skilled Therapeutic Interventions/Progress Updates:    OT session focused on L-NMR, balance, and functional mobility. Pt received sitting EOB and in good spirits. Pt ambulated to therapy gym with CGA using RW and verbal cues for attention to L hand placement. Engaged in L-NMR exercises using 1 and 2# dowel rods. OT provided cues for hand placement focusing on increasing web space opening and overall LUE strength/stabilizatoin. Completed 3x 5 sets of static hold of dowel 8-10 seconds focusing on stabilizing muscles of LUE and awareness of positioning of LUE. Completed ball squeezes and ball rotation for increasing strength of forearm musculature and intrinsic hand strength. Engaged in 4 sets x10 reps using textured ball to increase awareness of L hand placement. At end of session, pt ambulated back to room and left sitting EOB with husband present.   Therapy Documentation Precautions:  Precautions Precautions: Fall, Other (comment) Precaution Comments: LUE and LLE hemiparesis with impaired coordination Required Braces or Orthoses: Other Brace Other Brace: L AFO Restrictions Weight Bearing Restrictions: No General:   Vital Signs: Therapy Vitals Resp: 18 BP: 117/67 Pain: Pain Assessment Pain Scale: 0-10 Pain Score: 0-No pain ADL: ADL Eating: Set up Where Assessed-Eating: Edge of bed Grooming: Supervision/safety Where Assessed-Grooming: Wheelchair Upper Body Bathing: Supervision/safety Where Assessed-Upper Body Bathing: Wheelchair Lower Body Bathing: Minimal assistance Where Assessed-Lower Body Bathing: Wheelchair Upper Body Dressing: Setup Where  Assessed-Upper Body Dressing: Wheelchair Lower Body Dressing: Minimal assistance Where Assessed-Lower Body Dressing: Sitting at sink, Standing at sink Toileting: Minimal assistance Toilet Transfer: Minimal assistance Toilet Transfer Method: Stand pivot Toilet Transfer Equipment: Bedside commode Vision   Perception    Praxis   Exercises:   Other Treatments:     Therapy/Group: Individual Therapy  Duayne Cal 07/12/2020, 12:02 PM

## 2020-07-12 NOTE — Progress Notes (Signed)
Physical Therapy Session Note  Patient Details  Name: [name] MRN: [id number] Date of Birth: Dec 02, 1945  Today's Date: 07/12/2020 PT Individual Time: 0809-0906 PT Individual Time Calculation (min): 57 min   Short Term Goals: Week 2:  PT Short Term Goal 1 (Week 2): = to LTGs based on ELOS  Skilled Therapeutic Interventions/Progress Updates:   Pt received sitting EOB completing dressing and agreeable to therapy session. Sit<>stands to/from EOB using R hand support on bedrail and close supervision for safety - continues to demo R lateral weight shift compensating for L LE paresis during transfers. Donned pants, socks, and shoes sitting EOB with set-up assist. Pt reports she usually wakes up with low back pain at home but has not been waking up with pain while in hospital - discussed differences in sleep positions and possible use of hospital bed features to determine potential causes of the pain occurring at home. Gait training ~129ft to main therapy gym, no AD, with CGA for steadying - demos some carryover of increased L LE step length with min cuing, continues to demo decreased gait speed and decreased L LE stance time. Ascended/descended 16steps using B HRs with min assist for balance working on reciprocal stepping pattern for L LE NMR and strengthening - cuing to progress L hand along railing while navigating stairs. L LE NMR via forward stepping on/off 6" step in // bars with intermittent UE assist progressed to no UE support with min assist for balance - stepped up with L foot and back down with R focusing on R/L lateral weight shifting and L LE hip/knee extension to strengthening when stepping up. Progressed to L lateral step up/down on/off 6" step with intermittent progressed to no UE support with min assist for balance - continued cuing for sequencing and R/L lateral weight shift with upright posture (avoiding L lateral trunk lean during R stance). Sit<>stands throughout session with CGA for  steadying focusing on L LE alignment (avoiding excessive hip adduction) and equal B LE weight bearing - pt demoing improving ability to perform this task with increased L LE incorporation. Gait training ~126ft back to room, no AD, with CGA for steadying and cuing for increase gait speed and more symmetrical stance times. Sit>supine independently. Pt left supine in bed with needs in reach and her husband present.  Therapy Documentation Precautions:  Precautions Precautions: Fall, Other (comment) Precaution Comments: LUE and LLE hemiparesis with impaired coordination Required Braces or Orthoses: Other Brace Other Brace: L AFO Restrictions Weight Bearing Restrictions: No  Pain:  Denies pain during session.    Therapy/Group: Individual Therapy  Tawana Scale , PT, DPT, CSRS  07/12/2020, 7:50 AM

## 2020-07-13 ENCOUNTER — Encounter (HOSPITAL_COMMUNITY): Payer: Medicare Other | Admitting: Occupational Therapy

## 2020-07-13 NOTE — Discharge Summary (Signed)
Physician Discharge Summary  Patient ID: [name] MRN: [id number] DOB/AGE: 74/30/1947 74 y.o.  Admit date: 07/04/2020 Discharge date: 07/16/2020  Discharge Diagnoses:  Principal Problem:   Subcortical infarction [facility]) DVT prophylaxis Hypertension Hyperlipidemia History of mitral valve replacement  Discharged Condition: Stable  Significant Diagnostic Studies: CT ANGIO HEAD W OR WO CONTRAST  Result Date: 07/02/2020 CLINICAL DATA:  Dizziness and weakness. History of stroke with residual left-sided weakness. EXAM: CT ANGIOGRAPHY HEAD AND NECK TECHNIQUE: Multidetector CT imaging of the head and neck was performed using the standard protocol during bolus administration of intravenous contrast. Multiplanar CT image reconstructions and MIPs were obtained to evaluate the vascular anatomy. Carotid stenosis measurements (when applicable) are obtained utilizing NASCET criteria, using the distal internal carotid diameter as the denominator. CONTRAST:  53mL OMNIPAQUE IOHEXOL 350 MG/ML SOLN COMPARISON:  Brain MRI from yesterday.  CTA head neck 09/04/2019 FINDINGS: CT HEAD FINDINGS Brain: The patient's left frontal acute infarct is not readily identified by noncontrast CT. Chronic encephalomalacia and high-density nodule centered in the right parietal lobe, deep to a craniotomy. There is chronic small vessel ischemia including a remote lacunar infarct at the left thalamus. Right more than left small cerebellar infarcts. No acute hemorrhage, hydrocephalus, or masslike finding Vascular: See below Skull: Unremarkable right parietal craniotomy. No destructive findings Sinuses: Clear Orbits: Negative CTA NECK FINDINGS Aortic arch: Limited coverage of the arch which shows no dilatation or acute finding. Right carotid system: Mild atheromatous changes at the proximal ICA. No dissection or beading. Left carotid system: Mild atheromatous plaque at the carotid bifurcation. No stenosis, ulceration, or beading. Vertebral  arteries: Proximal subclavian atherosclerosis without flow limiting stenosis or ulceration. The vertebral arteries are smooth and widely patent to the dura. Skeleton: Cervical disc and facet degeneration. No acute or aggressive finding Other neck: No significant incidental finding. Upper chest: Negative Review of the MIP images confirms the above findings CTA HEAD FINDINGS Anterior circulation: Less than typical atheromatous changes on the siphons. No branch occlusion, beading, or aneurysm. Posterior circulation: Vertebral and basilar arteries are smooth and widely patent. Unremarkable cerebellar branching. Hypoplastic left P1 segment. Venous sinuses: Diffusely patent Anatomic variants: As above Review of the MIP images confirms the above findings IMPRESSION: 1. No emergent finding. 2. Mild atherosclerosis without flow limiting stenosis or embolic source identified in the head and neck. 3. Intracranial findings as described on preceding brain MRI. Electronically Signed   By: Monte Fantasia M.D.   On: 07/02/2020 04:46   CT ANGIO NECK W OR WO CONTRAST  Result Date: 07/02/2020 CLINICAL DATA:  Dizziness and weakness. History of stroke with residual left-sided weakness. EXAM: CT ANGIOGRAPHY HEAD AND NECK TECHNIQUE: Multidetector CT imaging of the head and neck was performed using the standard protocol during bolus administration of intravenous contrast. Multiplanar CT image reconstructions and MIPs were obtained to evaluate the vascular anatomy. Carotid stenosis measurements (when applicable) are obtained utilizing NASCET criteria, using the distal internal carotid diameter as the denominator. CONTRAST:  71mL OMNIPAQUE IOHEXOL 350 MG/ML SOLN COMPARISON:  Brain MRI from yesterday.  CTA head neck 09/04/2019 FINDINGS: CT HEAD FINDINGS Brain: The patient's left frontal acute infarct is not readily identified by noncontrast CT. Chronic encephalomalacia and high-density nodule centered in the right parietal lobe, deep to  a craniotomy. There is chronic small vessel ischemia including a remote lacunar infarct at the left thalamus. Right more than left small cerebellar infarcts. No acute hemorrhage, hydrocephalus, or masslike finding Vascular: See below Skull: Unremarkable right parietal craniotomy.  No destructive findings Sinuses: Clear Orbits: Negative CTA NECK FINDINGS Aortic arch: Limited coverage of the arch which shows no dilatation or acute finding. Right carotid system: Mild atheromatous changes at the proximal ICA. No dissection or beading. Left carotid system: Mild atheromatous plaque at the carotid bifurcation. No stenosis, ulceration, or beading. Vertebral arteries: Proximal subclavian atherosclerosis without flow limiting stenosis or ulceration. The vertebral arteries are smooth and widely patent to the dura. Skeleton: Cervical disc and facet degeneration. No acute or aggressive finding Other neck: No significant incidental finding. Upper chest: Negative Review of the MIP images confirms the above findings CTA HEAD FINDINGS Anterior circulation: Less than typical atheromatous changes on the siphons. No branch occlusion, beading, or aneurysm. Posterior circulation: Vertebral and basilar arteries are smooth and widely patent. Unremarkable cerebellar branching. Hypoplastic left P1 segment. Venous sinuses: Diffusely patent Anatomic variants: As above Review of the MIP images confirms the above findings IMPRESSION: 1. No emergent finding. 2. Mild atherosclerosis without flow limiting stenosis or embolic source identified in the head and neck. 3. Intracranial findings as described on preceding brain MRI. Electronically Signed   By: Monte Fantasia M.D.   On: 07/02/2020 04:46   MR BRAIN WO CONTRAST  Result Date: 07/01/2020 CLINICAL DATA:  Initial evaluation for neuro deficit, stroke suspected. EXAM: MRI HEAD WITHOUT CONTRAST TECHNIQUE: Multiplanar, multiecho pulse sequences of the brain and surrounding structures were  obtained without intravenous contrast. COMPARISON:  Prior Study from 09/03/2019. FINDINGS: Brain: Moderate to advanced generalized age-related cerebral atrophy. Patchy and confluent T2/FLAIR hyperintensity within the periventricular deep white matter both cerebral hemispheres most consistent with chronic small vessel ischemic disease, moderate in nature. Multiple scattered remote lacunar infarcts noted involving the left thalamus. Multiple scattered small remote bilateral cerebellar infarcts noted as well. Large area of encephalomalacia and gliosis involving the cortical and subcortical right parietal lobe again noted. Associated scattered areas of chronic hemosiderin staining. Sequelae of overlying right craniotomy. Few scattered foci of diffusion abnormality within this region favored to be related to susceptibility artifact rather than acute ischemia. Single small curvilinear cortical subcortical ischemic infarcts seen involving the parasagittal posterior left frontal lobe (series 2, images 43, 40, 39). No associated hemorrhage or mass effect. No other convincing foci of restricted diffusion to suggest acute or subacute ischemia. Gray-white matter differentiation otherwise maintained. No acute intracranial hemorrhage. Additional single punctate chronic microhemorrhage noted within the left temporal lobe. No mass lesion, midline shift or mass effect. Mild ventricular prominence related global parenchymal volume loss without hydrocephalus. Mild ex vacuo dilatation of the right lateral ventricle related to the right parietal encephalomalacia noted as well. No extra-axial fluid collection. Pituitary gland suprasellar region normal. Midline structures intact. Vascular: Major intracranial vascular flow voids are maintained. Skull and upper cervical spine: Craniocervical junction within normal limits. Bone marrow signal intensity normal. Remote right parietal craniotomy noted. No acute scalp soft tissue abnormality.  Sinuses/Orbits: Globes and orbital soft tissues within normal limits. Paranasal sinuses are largely clear. No significant mastoid effusion. Other: None. IMPRESSION: 1. Single small curvilinear cortical to subcortical ischemic nonhemorrhagic infarct involving the parasagittal posterior left frontal lobe. 2. No other acute intracranial abnormality. 3. Chronic right parietal encephalomalacia and gliosis with associated chronic hemosiderin staining and sequelae of overlying right parietal craniotomy, stable. 4. Underlying moderate to advanced age-related cerebral atrophy with chronic microvascular ischemic disease, with multiple remote lacunar infarcts involving the left thalamus and bilateral cerebellar hemispheres. Electronically Signed   By: Jeannine Boga M.D.   On: 07/01/2020 20:51  ECHOCARDIOGRAM COMPLETE  Result Date: 07/02/2020    ECHOCARDIOGRAM REPORT   Patient Name:   [name] Date of Exam: 07/02/2020 Medical Rec #:  [id number]      Height:       67.0 in Accession #:    [id number]     Weight:       133.0 lb Date of Birth:  Dec 25, 1945      BSA:          1.700 m Patient Age:    15 years       BP:           123/74 mmHg Patient Gender: F              HR:           76 bpm. Exam Location:  Inpatient Procedure: 2D Echo, Cardiac Doppler and Color Doppler Indications:    Stroke  History:        Patient has prior history of Echocardiogram examinations, most                 recent 09/04/2019. Risk Factors:Diabetes, Hypertension and                 Dyslipidemia. CVA. H/O mechanical mitral valve replacement.                  Mitral Valve: mechanical valve valve is present in the mitral                 position.  Sonographer:    Clayton Lefort RDCS (AE) Referring Phys: [id number] [place]  1. Left ventricular ejection fraction, by estimation, is 55 to 60%. The left ventricle has normal function. The left ventricle has no regional wall motion abnormalities. There is mild concentric left  ventricular hypertrophy. Left ventricular diastolic function could not be evaluated.  2. Right ventricular systolic function is normal. The right ventricular size is normal. There is normal pulmonary artery systolic pressure. The estimated right ventricular systolic pressure is 03.2 mmHg.  3. Right atrial size was mildly dilated.  4. Mechanical mitral valve replacement. Normal gradient. EOA 1.84 cm2, DI 1.7, E max 1.1 m/s, PHT 99 msec. The mitral valve has been repaired/replaced. No evidence of mitral valve regurgitation. The mean mitral valve gradient is 3.0 mmHg with average heart rate of 76 bpm. There is a mechanical valve present in the mitral position. Echo findings are consistent with normal structure and function of the mitral valve prosthesis.  5. The aortic valve is tricuspid. Aortic valve regurgitation is not visualized. Mild aortic valve sclerosis is present, with no evidence of aortic valve stenosis.  6. The inferior vena cava is normal in size with greater than 50% respiratory variability, suggesting right atrial pressure of 3 mmHg. Comparison(s): No significant change from prior study. FINDINGS  Left Ventricle: Left ventricular ejection fraction, by estimation, is 55 to 60%. The left ventricle has normal function. The left ventricle has no regional wall motion abnormalities. The left ventricular internal cavity size was normal in size. There is  mild concentric left ventricular hypertrophy. Abnormal (paradoxical) septal motion consistent with post-operative status. Left ventricular diastolic function could not be evaluated due to mitral valve replacement. Left ventricular diastolic function could not be evaluated. Right Ventricle: The right ventricular size is normal. No increase in right ventricular wall thickness. Right ventricular systolic function is normal. There is normal pulmonary artery systolic pressure. The tricuspid regurgitant velocity is 2.43 m/s, and  with  an assumed right atrial pressure  of 3 mmHg, the estimated right ventricular systolic pressure is 76.7 mmHg. Left Atrium: Left atrial size was not well visualized. Right Atrium: Right atrial size was mildly dilated. Pericardium: A small pericardial effusion is present. The pericardial effusion is circumferential. Mitral Valve: Mechanical mitral valve replacement. Normal gradient. EOA 1.84 cm2, DI 1.7, E max 1.1 m/s, PHT 99 msec. The mitral valve has been repaired/replaced. No evidence of mitral valve regurgitation. There is a mechanical valve present in the mitral position. Echo findings are consistent with normal structure and function of the mitral valve prosthesis. MV peak gradient, 5.5 mmHg. The mean mitral valve gradient is 3.0 mmHg with average heart rate of 76 bpm. Tricuspid Valve: The tricuspid valve is grossly normal. Tricuspid valve regurgitation is mild . No evidence of tricuspid stenosis. Aortic Valve: The aortic valve is tricuspid. . There is mild thickening and mild calcification of the aortic valve. Aortic valve regurgitation is not visualized. Mild aortic valve sclerosis is present, with no evidence of aortic valve stenosis. There is mild thickening of the aortic valve. There is mild calcification of the aortic valve. Aortic valve mean gradient measures 2.0 mmHg. Aortic valve peak gradient measures 4.2 mmHg. Aortic valve area, by VTI measures 2.26 cm. Pulmonic Valve: The pulmonic valve was grossly normal. Pulmonic valve regurgitation is trivial. No evidence of pulmonic stenosis. Aorta: The aortic root and ascending aorta are structurally normal, with no evidence of dilitation. Venous: The inferior vena cava is normal in size with greater than 50% respiratory variability, suggesting right atrial pressure of 3 mmHg. IAS/Shunts: The atrial septum is grossly normal.  LEFT VENTRICLE PLAX 2D LVIDd:         3.50 cm LVIDs:         2.30 cm LV PW:         1.40 cm LV IVS:        1.20 cm LVOT diam:     2.00 cm LV SV:         46 LV SV Index:    27 LVOT Area:     3.14 cm  RIGHT VENTRICLE             IVC RV Basal diam:  3.30 cm     IVC diam: 1.00 cm RV S prime:     10.70 cm/s TAPSE (M-mode): 2.2 cm LEFT ATRIUM             Index       RIGHT ATRIUM           Index LA diam:        2.80 cm 1.65 cm/m  RA Area:     21.30 cm LA Vol (A2C):   63.7 ml 37.46 ml/m RA Volume:   65.30 ml  38.40 ml/m LA Vol (A4C):   48.7 ml 28.64 ml/m LA Biplane Vol: 56.3 ml 33.11 ml/m  AORTIC VALVE AV Area (Vmax):    2.21 cm AV Area (Vmean):   2.05 cm AV Area (VTI):     2.26 cm AV Vmax:           102.00 cm/s AV Vmean:          74.800 cm/s AV VTI:            0.206 m AV Peak Grad:      4.2 mmHg AV Mean Grad:      2.0 mmHg LVOT Vmax:         71.80 cm/s LVOT Vmean:  48.800 cm/s LVOT VTI:          0.148 m LVOT/AV VTI ratio: 0.72  AORTA Ao Root diam: 3.80 cm Ao Asc diam:  3.40 cm MITRAL VALVE            TRICUSPID VALVE MV Area VTI:  1.84 cm  TR Peak grad:   23.6 mmHg MV Peak grad: 5.5 mmHg  TR Vmax:        243.00 cm/s MV Mean grad: 3.0 mmHg MV Vmax:      1.17 m/s  SHUNTS MV Vmean:     83.5 cm/s Systemic VTI:  0.15 m MV VTI:       0.25 m    Systemic Diam: 2.00 cm Eleonore Chiquito MD Electronically signed by Eleonore Chiquito MD Signature Date/Time: 07/02/2020/12:55:23 PM    Final     Labs:  Basic Metabolic Panel: No results for input(s): NA, K, CL, CO2, GLUCOSE, BUN, CREATININE, CALCIUM, MG, PHOS in the last 168 hours.  CBC: Recent Labs  Lab 07/14/20 0620  WBC 3.5*  HGB 12.8  HCT 39.8  MCV 90.0  PLT 176    CBG: No results for input(s): GLUCAP in the last 168 hours.  Family history.  Mother with bone cancer CAD and hyperlipidemia hypertension.  Father with CVA and hypertension.  Denies any colon cancer rectal cancer or esophageal cancer  Brief HPI:   [name] is a 74 y.o. 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  Per chart review lives with spouse.  Used a cane prior to admission and a left AFO.  She has a son in [place].  Presented 07/01/2020 with dizziness and weakness x3 days.  MRI showed single small cortical subcortical ischemic nonhemorrhagic infarct involving the parasagittal posterior left frontal lobe.  Underlying moderate to advanced age-related cerebral atrophy with chronic microvascular ischemic disease with multiple remote lacunar infarcts involving the left thalamus and bilateral cerebellar hemispheres.  CT angiogram of head and neck with no emergent findings.  Patient did not receive TPA.  Admission chemistries INR 2.9 hemoglobin A1c 6.1 chemistries unremarkable.  Echocardiogram with ejection fraction of 60% no wall motion abnormalities.  Chronic Coumadin ongoing with a goal INR of 3.0-3.5.  Tolerating regular diet.  Patient was admitted for a comprehensive rehab program.   Hospital Course: [facility] was admitted to rehab 07/04/2020 for inpatient therapies to consist of PT, ST and OT at least three hours five days a week. Past admission physiatrist, therapy team and rehab RN have worked together to provide customized collaborative inpatient rehab.  Pertaining to patient's left frontal cortical subcortical parasagittal infarct as well as history of right MCA CVA old right parietal encephalomalacia old left thalamic and bilateral cerebellar lacunar infarct remained stable she would follow-up with neurology services she remained on chronic Coumadin therapy with a goal INR of 3.0-3.5 as well as low-dose aspirin and she was to follow-up with [facility] for ongoing Coumadin care.  Blood pressure controlled with Norvasc monitor closely Lipitor ongoing for hyperlipidemia.  With noted history of mitral valve replacement she did continue on chronic Coumadin.  GERD with Protonix.  Prediabetes hemoglobin A1c 6.1 blood sugar checks  discontinued.   Blood pressures were monitored on TID basis and controlled     Rehab course: During patient's stay in rehab weekly team conferences were held to monitor  patient's progress, set goals and discuss barriers to discharge. At admission, patient required minimal assist ambulation 80 feet rolling walker minimal assist sit to stand supervision supine to sit.  Minimal assist upper body bathing minimal assist lower body bathing minimal assist upper body dressing minimal assist lower body dressing  Physical exam.  Blood pressure 128/70 pulse 70 temperature 98 respirations 18 oxygen saturation 92% room air Constitutional no acute distress HEENT Head.  Normocephalic and atraumatic Eyes.  Pupils round and reactive to light no discharge without nystagmus Neck.  Supple nontender no JVD without thyromegaly Cardiac regular rate rhythm without extra sounds or murmur heard Abdomen.  Soft nontender positive bowel sounds without rebound Skin.  Warm and dry Neurologic.  Alert no acute distress makes eye contact with examiner fair awareness of deficits.  5/5 strength on right side 4/5 left leg  /She  has had improvement in activity tolerance, balance, postural control as well as ability to compensate for deficits. [name] has had improvement in functional use RUE/LUE  and RLE/LLE as well as improvement in awareness.  Sit to stand to and from edge of bed using right hand support on bed rail close supervision.  Donned pants socks shoe sitting edge of bed with set up assist.  Ambulates 150 feet to the main therapy gym no assistive device contact-guard assist.  Up-and-down 16 steps using bilateral handrails minimal assistance.  Ambulates to the therapy gym for her ADLs contact-guard rolling walker as needed.  Full family teaching was completed plan discharge to home       Disposition: Discharge to home    Diet: Regular  Special Instructions: No driving smoking or alcohol  Home health nurse to  check INR 07/18/2020   results to [facility] [place] [facility] [phone] fax [phone]  Medications at discharge 1.  Tylenol as needed 2.  Norvasc 5 mg nightly 3.  Aspirin 81 mg p.o. daily 4.  Lipitor 40 mg p.o. nightly 5.  Colace 100 mg p.o. nightly 6.  Protonix 40 mg p.o. daily 7.  Coumadin 5 mg Sunday Tuesday Wednesday Thursday Saturday 7.5 mg Mondays and Fridays  30-35 min were spent completing discharge summary and discharge planning   Discharge Instructions    Ambulatory referral to Neurology   Complete by: As directed    An appointment is requested in approximately 4 weeks cortical subcortical posterior left frontal lobe infarction   Ambulatory referral to Physical Medicine Rehab   Complete by: As directed    Moderate complexity follow-up 1 to 2 weeks cortical subcortical posterior left frontal lobe infarction       Follow-up Information    Kirsteins, Luanna Salk, MD Follow up.   Specialty: Physical Medicine and Rehabilitation Why: Office to call for appointment Contact information: [place] [address] [phone]               Signed: Cathlyn Parsons 07/16/2020, 5:24 AM

## 2020-07-13 NOTE — Progress Notes (Signed)
Occupational Therapy Session Note  Patient Details  Name: [name] MRN: [id number] Date of Birth: 08-26-1946  Today's Date: 07/13/2020 OT Group Time: 1100-1200 OT Group Time Calculation (min): 60 min  Skilled Therapeutic Interventions/Progress Updates:    Pt engaged in therapeutic w/c level dance group focusing on patient choice, UE/LE strengthening, salience, activity tolerance, and social participation. Pt was guided through various dance-based exercises involving UEs/LEs and trunk. All music was selected by group members. Emphasis placed on Lt NMR and activity tolerance. She exhibited high levels of participation in group today, included the Lt sided during exercises without cues to work on strength/coordination. She declined standing though was provided with the opportunity to do so. Her spouse [name] also attended group and at end of session pts spouse returned pt to the room via w/c.    Therapy Documentation Precautions:  Precautions Precautions: Fall, Other (comment) Precaution Comments: LUE and LLE hemiparesis with impaired coordination Required Braces or Orthoses: Other Brace Other Brace: L AFO Restrictions Weight Bearing Restrictions: No Pain: no s/s pain during session   ADL: ADL Eating: Set up Where Assessed-Eating: Edge of bed Grooming: Supervision/safety Where Assessed-Grooming: Wheelchair Upper Body Bathing: Supervision/safety Where Assessed-Upper Body Bathing: Wheelchair Lower Body Bathing: Minimal assistance Where Assessed-Lower Body Bathing: Wheelchair Upper Body Dressing: Setup Where Assessed-Upper Body Dressing: Wheelchair Lower Body Dressing: Minimal assistance Where Assessed-Lower Body Dressing: Sitting at sink, Standing at sink Toileting: Minimal assistance Toilet Transfer: Minimal assistance Toilet Transfer Method: Stand pivot Toilet Transfer Equipment: Bedside commode     Therapy/Group: Group Therapy  [name] A Justise Ehmann 07/13/2020, 12:40 PM

## 2020-07-13 NOTE — Progress Notes (Signed)
[place] PHYSICAL MEDICINE & REHABILITATION PROGRESS NOTE   Subjective/Complaints:   Pt reports her husband picked up an Rx for Protonix that was called in by someone to pharmacy- explained she's on it here- but she took Omeprazole at home- explained can take 1 or other, but not both when goes home.    I don't know name of physician who called in protonix- maybe from acute.    ROS:   Pt denies SOB, abd pain, CP, N/V/C/D, and vision changes   Objective:   No results found. No results for input(s): WBC, HGB, HCT, PLT in the last 72 hours. No results for input(s): NA, K, CL, CO2, GLUCOSE, BUN, CREATININE, CALCIUM in the last 72 hours.  Intake/Output Summary (Last 24 hours) at 07/13/2020 1525 Last data filed at 07/13/2020 1330 Gross per 24 hour  Intake 838 ml  Output --  Net 838 ml     Physical Exam: Vital Signs Blood pressure 112/66, pulse 67, temperature (!) 97.5 F (36.4 C), resp. rate 16, height 5\' 7"  (1.702 m), weight 59.2 kg, SpO2 97 %.   General: No acute distress- sitting EOB, trying to dress herself, husband in room, NAD- slightly nonfluent- pausing a lot- word finding some/anomia Mood and affect are appropriate Heart: RRR Lungs: CTA B/L- no W/R/R- good air movement Abdomen: Soft, NT, ND, (+)BS  Extremities: No clubbing, cyanosis, or edema Skin: No evidence of breakdown, no evidence of rash- a lot of bruising- brownish color on UEs from IV/blood draws Neurologic: 5/5 on RIght side 4/5 on left except L ADF 3- LUE fine motor deficits, also with LUE dysdiadochokinesis  Psych: Pt's affect is appropriate. Pt is cooperative, cannot appear to understand about Protonix issue as above.    Assessment/Plan: 1. Functional deficits secondary to left frontal  infarction which require 3+ hours per day of interdisciplinary therapy in a comprehensive inpatient rehab setting.  Physiatrist is providing close team supervision and 24 hour management of active medical problems  listed below.  Physiatrist and rehab team continue to assess barriers to discharge/monitor patient progress toward functional and medical goals  Care Tool:  Bathing    Body parts bathed by patient: Left arm, Chest, Right arm, Left upper leg, Right lower leg, Left lower leg, Abdomen, Front perineal area, Face, Buttocks, Right upper leg         Bathing assist Assist Level: Minimal Assistance - Patient > 75%     Upper Body Dressing/Undressing Upper body dressing   What is the patient wearing?: Bra, Pull over shirt    Upper body assist Assist Level: Set up assist    Lower Body Dressing/Undressing Lower body dressing      What is the patient wearing?: Underwear/pull up, Pants     Lower body assist Assist for lower body dressing: Contact Guard/Touching assist     Toileting Toileting    Toileting assist Assist for toileting: Moderate Assistance - Patient 50 - 74%     Transfers Chair/bed transfer  Transfers assist     Chair/bed transfer assist level: Contact Guard/Touching assist     Locomotion Ambulation   Ambulation assist      Assist level: Contact Guard/Touching assist Assistive device: No Device Max distance: 265ft   Walk 10 feet activity   Assist     Assist level: Contact Guard/Touching assist Assistive device: No Device   Walk 50 feet activity   Assist    Assist level: Contact Guard/Touching assist Assistive device: No Device    Walk 150  feet activity   Assist    Assist level: Contact Guard/Touching assist Assistive device: No Device    Walk 10 feet on uneven surface  activity   Assist     Assist level: Minimal Assistance - Patient > 75% Assistive device: Other (comment) (using railing support)   Wheelchair     Assist Will patient use wheelchair at discharge?: No             Wheelchair 50 feet with 2 turns activity    Assist            Wheelchair 150 feet activity     Assist      Assist  Level: Minimal Assistance - Patient > 75%   Blood pressure 112/66, pulse 67, temperature (!) 97.5 F (36.4 C), resp. rate 16, height 5\' 7"  (1.702 m), weight 59.2 kg, SpO2 97 %.   Medical Problem List and Plan: 1.  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  Antithrombotics: -DVT/anticoagulation: Chronic Coumadin for history of MVR.  Goal INR 3.0-3.5.  INR is 3.2 on 8/29, 3.7 on 8/30 , 3.5 8/31 pending today   9/4- INR 3.3 yesterday late             -antiplatelet therapy: Aspirin 81 mg daily 3. Pain Management: Tylenol as needed.   9/4- well controlled with tylenol- con't regimen prn 4. Mood: Advised emotional support             -antipsychotic agents: N/A 5. Neuropsych: This patient is capable of making decisions on her own behalf. Cognition impaired in respect to problem solving for WC use, ? Apraxic  9/4- speech is slightly nonfluent and word searching some- also couldn't remember some basic words- rolling walker, w/c, the equipment she's going to go home with- verbal apraxia???  9/5- anomia noted today- husband feels she's at baseline.  6. Skin/Wound Care: Routine skin checks. May remove IV 8/27. 7. Fluids/Electrolytes/Nutrition: Routine in and outs with follow-up chemistries 8.  Hypertension.  Norvasc 10 mg daily.  Monitor with increased mobility.  Norvasc to 5mg .  Vitals:   07/13/20 0447 07/13/20 1424  BP: 116/72 112/66  Pulse: 64 67  Resp: 18 16  Temp: 97.6 F (36.4 C) (!) 97.5 F (36.4 C)  SpO2: 98% 97%  9/5- BP well controlled- con't regimen 9.  Hyperlipidemia.  Lipitor 10.  History of mitral valve replacement.  INR goal 3-3.5 INR pending   9/4- INR  3.3  9/5- no INR today    11.  Prediabetes.  Hemoglobin A1c 6.1.  Blood sugar checks discontinued 12.  GERD.  Protonix  LOS: 9 days A FACE TO FACE EVALUATION WAS PERFORMED  Rejina Odle 07/13/2020, 3:25 PM

## 2020-07-14 ENCOUNTER — Inpatient Hospital Stay (HOSPITAL_COMMUNITY): Payer: Medicare Other | Admitting: Occupational Therapy

## 2020-07-14 ENCOUNTER — Inpatient Hospital Stay (HOSPITAL_COMMUNITY): Payer: Medicare Other

## 2020-07-14 ENCOUNTER — Inpatient Hospital Stay (HOSPITAL_COMMUNITY): Payer: Medicare Other | Admitting: Physical Therapy

## 2020-07-14 LAB — CBC
HCT: 39.8 % (ref 36.0–46.0)
Hemoglobin: 12.8 g/dL (ref 12.0–15.0)
MCH: 29 pg (ref 26.0–34.0)
MCHC: 32.2 g/dL (ref 30.0–36.0)
MCV: 90 fL (ref 80.0–100.0)
Platelets: 176 10*3/uL (ref 150–400)
RBC: 4.42 MIL/uL (ref 3.87–5.11)
RDW: 13.2 % (ref 11.5–15.5)
WBC: 3.5 10*3/uL — ABNORMAL LOW (ref 4.0–10.5)
nRBC: 0 % (ref 0.0–0.2)

## 2020-07-14 LAB — PROTIME-INR
INR: 3.2 — ABNORMAL HIGH (ref 0.8–1.2)
Prothrombin Time: 31.4 seconds — ABNORMAL HIGH (ref 11.4–15.2)

## 2020-07-14 NOTE — Progress Notes (Signed)
Occupational Therapy Session Note  Patient Details  Name: [name] MRN: [id number] Date of Birth: [birth year]  Today's Date: 07/14/2020 OT Individual Time: 1005-1101 OT Individual Time Calculation (min): 56 min    Short Term Goals: Week 2:  OT Short Term Goal 1 (Week 2): STGs equal to LTGS set at supervision level overall.  Skilled Therapeutic Interventions/Progress Updates:    Session 1: (1005-1101)  Pt ambulated from her room down to the ortho gym with min guard assist using the RW for support.  She demonstrates hemiparetic gait with compensatory hip circumduction on the left and occasionally hitting the RW on the left side.  Increased time with some rotation to the left is noted when ambulating.  Once in the gym had her transfer to supine where therapist worked on stretching of the left shoulder into flexion, abduction, and abduction with external rotation.  Increased biceps tone also noted with some stretching into elbow extension as well.  She transitioned to sitting where focus was placed on weightbearing over the LUE placed beside her on the mat.  Had her work on leaning down onto the left arm and forearm and then working on activation of the triceps to return back to sitting.  She demonstrates increased compensation of right trunk to assist with movement.  When this was eliminated, she needed min assist or more to return to midline.  Next, had her work on picking up small 1" wooden pegs with the LUE and then place them in a grid placed at knee level.  Emphasis on trying to achieve full left elbow extension while reaching to place peg.  Pt still with at least 10-15 degree lag in extension secondary to increased tone.  Finished session with ambulation back to the room at min assist level and no device.  Call button and phone in reach with safety belt in place.  Pt's spouse also present as well.    Session 2: (3220-2542) Pt completed functional mobility down to the therapy gym with min guard  assist without use of an assistive device.  She was able to then transition to supine for work on left shoulder flexion with sustained elbow extension using a hula hoop.  She was able to complete movements of shoulder flexion bilaterally, but still needed min to mod facilitation for keeping the hoop at midline as well as for maintaining left elbow extension within 10 degrees of full.  She continues to demonstrate increased tone in the biceps as well as some in the triceps and pectoral making smooth transitional movements more difficult.  Finished session with ambulation back to the room at min guard to min assist and transfer to the wheelchair.  She was left with the safety belt in place and the call button and phone in reach.     Therapy Documentation Precautions:  Precautions Precautions: Fall, Other (comment) Precaution Comments: LUE and LLE hemiparesis with impaired coordination Required Braces or Orthoses: Other Brace Other Brace: L AFO Restrictions Weight Bearing Restrictions: No   Pain: Pain Assessment Pain Scale: Faces Pain Score: 0-No pain ADL: See Care Tool Section for some details of mobility and selfcare  Therapy/Group: Individual Therapy  Freddie Nghiem OTR/L 07/14/2020, 12:27 PM

## 2020-07-14 NOTE — Progress Notes (Signed)
[place] PHYSICAL MEDICINE & REHABILITATION PROGRESS NOTE   Subjective/Complaints:  Discussed INR with pt no other issues, walked with OT , husband in room, discussed BP monitoring at home   ROS:   Pt denies SOB, abd pain, CP, N/V/C/D, and vision changes   Objective:   No results found. Recent Labs    07/14/20 0620  WBC 3.5*  HGB 12.8  HCT 39.8  PLT 176   No results for input(s): NA, K, CL, CO2, GLUCOSE, BUN, CREATININE, CALCIUM in the last 72 hours.  Intake/Output Summary (Last 24 hours) at 07/14/2020 1056 Last data filed at 07/14/2020 0738 Gross per 24 hour  Intake 720 ml  Output --  Net 720 ml     Physical Exam: Vital Signs Blood pressure 101/70, pulse 84, temperature 97.6 F (36.4 C), resp. rate 16, height 5\' 7"  (1.702 m), weight 59.2 kg, SpO2 97 %.  General: No acute distress Mood and affect are appropriate Heart: Regular rate and rhythm no rubs murmurs or extra sounds Lungs: Clear to auscultation, breathing unlabored, no rales or wheezes Abdomen: Positive bowel sounds, soft nontender to palpation, nondistended Extremities: No clubbing, cyanosis, or edema Skin: No evidence of breakdown, no evidence of rash   Neurologic: 5/5 on RIght side 4/5 on left except L ADF 3- LUE fine motor deficits, also with LUE dysdiadochokinesis  Psych: Pt's affect is appropriate. Pt is cooperative, cannot appear to understand about Protonix issue as above.    Assessment/Plan: 1. Functional deficits secondary to left frontal  infarction which require 3+ hours per day of interdisciplinary therapy in a comprehensive inpatient rehab setting.  Physiatrist is providing close team supervision and 24 hour management of active medical problems listed below.  Physiatrist and rehab team continue to assess barriers to discharge/monitor patient progress toward functional and medical goals  Care Tool:  Bathing    Body parts bathed by patient: Left arm, Chest, Right arm, Left upper leg,  Right lower leg, Left lower leg, Abdomen, Front perineal area, Face, Buttocks, Right upper leg         Bathing assist Assist Level: Minimal Assistance - Patient > 75%     Upper Body Dressing/Undressing Upper body dressing   What is the patient wearing?: Bra, Pull over shirt    Upper body assist Assist Level: Set up assist    Lower Body Dressing/Undressing Lower body dressing      What is the patient wearing?: Underwear/pull up, Pants     Lower body assist Assist for lower body dressing: Contact Guard/Touching assist     Toileting Toileting    Toileting assist Assist for toileting: Moderate Assistance - Patient 50 - 74%     Transfers Chair/bed transfer  Transfers assist     Chair/bed transfer assist level: Contact Guard/Touching assist     Locomotion Ambulation   Ambulation assist      Assist level: Contact Guard/Touching assist Assistive device: No Device Max distance: 275ft   Walk 10 feet activity   Assist     Assist level: Contact Guard/Touching assist Assistive device: No Device   Walk 50 feet activity   Assist    Assist level: Contact Guard/Touching assist Assistive device: No Device    Walk 150 feet activity   Assist    Assist level: Contact Guard/Touching assist Assistive device: No Device    Walk 10 feet on uneven surface  activity   Assist     Assist level: Minimal Assistance - Patient > 75% Assistive device: Other (comment) (  using railing support)   Wheelchair     Assist Will patient use wheelchair at discharge?: No             Wheelchair 50 feet with 2 turns activity    Assist            Wheelchair 150 feet activity     Assist      Assist Level: Minimal Assistance - Patient > 75%   Blood pressure 101/70, pulse 84, temperature 97.6 F (36.4 C), resp. rate 16, height 5\' 7"  (1.702 m), weight 59.2 kg, SpO2 97 %.   Medical Problem List and Plan: 1.  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  Antithrombotics: -DVT/anticoagulation: Chronic Coumadin for history of MVR.  Goal INR 3.0-3.5.                -antiplatelet therapy: Aspirin 81 mg daily 3. Pain Management: Tylenol as needed.   9/4- well controlled with tylenol- con't regimen prn 4. Mood: Advised emotional support             -antipsychotic agents: N/A 5. Neuropsych: This patient is capable of making decisions on her own behalf. Cognition impaired in respect to problem solving for WC use, ? Apraxic  9/4- speech is slightly nonfluent and word searching some- also couldn't remember some basic words- rolling walker, w/c, the equipment she's going to go home with- verbal apraxia???  9/5- anomia noted today- husband feels she's at baseline.  6. Skin/Wound Care: Routine skin checks. May remove IV 8/27. 7. Fluids/Electrolytes/Nutrition: Routine in and outs with follow-up chemistries 8.  Hypertension.  Norvasc 10 mg daily.  Monitor with increased mobility.  Norvasc to 5mg .  Vitals:   07/13/20 2010 07/14/20 0451  BP: 126/68 101/70  Pulse: 70 84  Resp: 18 16  Temp: 98.1 F (36.7 C) 97.6 F (36.4 C)  SpO2: 97%   9/5- BP well controlled- con't regimen 9.  Hyperlipidemia.  Lipitor 10.  History of mitral valve replacement.  INR goal 3-3.5 INR pending   9/4- INR 3.3  9/6 INR 3.2    11.  Prediabetes.  Hemoglobin A1c 6.1.  Blood sugar checks discontinued 12.  GERD.  Protonix  LOS: 10 days A FACE TO FACE EVALUATION WAS PERFORMED  [name] 07/14/2020, 10:56 AM

## 2020-07-14 NOTE — Progress Notes (Signed)
Physical Therapy Session Note  Patient Details  Name: [name] MRN: [id number] Date of Birth: [birth year]  Today's Date: 07/14/2020 PT Individual Time: 4801-6553 PT Individual Time Calculation (min): 43 min   Short Term Goals: Week 2:  PT Short Term Goal 1 (Week 2): = to LTGs based on ELOS  Skilled Therapeutic Interventions/Progress Updates:    Patient received sitting up in wc agreeable to PT. She denies pain at this time. Patient agreeable to high intensity gait training on treadmill with LiteGait. She ambulated 3 mins at 1.48mph for 24ft with intermittent tactile cuing to facilitate larger step on L. Increased tone noted to L LE contributing to incoordination/shortened stride length. 50mins at 1.69mph with 2# AW on for 262ft. Additional 10mins with no AW, but 3% grade incline at 1.93mph for 221ft. Final 3 mins with no incline or AW at 1.61mph for additional 251ft. Incline used to encourage increased foot clearance upon return to flat ground with good effect. Patient ambulating 139ft overground with FWW + CGA. Incoordination of L LE leading to intermittent LOB requiring up to MinA from PT to recover. Patient ambulated back to room and returned to chair, seatbelt alarm on, call light within reach.   Therapy Documentation Precautions:  Precautions Precautions: Fall, Other (comment) Precaution Comments: LUE and LLE hemiparesis with impaired coordination Required Braces or Orthoses: Other Brace Other Brace: L AFO Restrictions Weight Bearing Restrictions: No    Therapy/Group: Individual Therapy  Karoline Caldwell, PT, DPT, CBIS 07/14/2020, 7:46 AM

## 2020-07-14 NOTE — Progress Notes (Signed)
Physical Therapy Session Note  Patient Details  Name: [name] MRN: [id number] Date of Birth: 10-06-1946  Today's Date: 07/14/2020 PT Individual Time: 9379-0240 PT Individual Time Calculation (min): 40 min   Short Term Goals: Week 2:  PT Short Term Goal 1 (Week 2): = to LTGs based on ELOS  Skilled Therapeutic Interventions/Progress Updates: Pt presented in bed agreeable to therapy. Pt denies pain during session. Pt indicated need to wash up before leaving room. PTA set up wash basin as pt performed bed mobility to EOB with supervision. Pt washed up EOB demonstrating use of BUE throughout activity. Pt was able to doff gown and underwear with distant supervision as well as don brief, shirt, socks, pants, and shoes/AFO with supervision. Pt then stood and ambulated to sink to perform oral hygiene in standing. Once completed pt ambulated to day room without AD and CGA. Pt noted to initially have decreased L knee flexion and compensatory L circumduction. With verbal cues pt able to improve to a more swing through pattern with actual heel strike. In day room pt ambulated 33ft x 4 while stepping over objects with LLE. PTA provided facilitation to increase wt shift to R to facilitate L foot clearance. Once completed pt ambulated back to room with improved swing through and requesting to sit in w/c. Pt left in w/c with belt alarm on, call bell within reach and current needs met.     Therapy Documentation Precautions:  Precautions Precautions: Fall, Other (comment) Precaution Comments: LUE and LLE hemiparesis with impaired coordination Required Braces or Orthoses: Other Brace Other Brace: L AFO Restrictions Weight Bearing Restrictions: No General:   Vital Signs: Therapy Vitals Temp: 98.4 F (36.9 C) Pulse Rate: 91 Resp: 17 BP: 107/69 Patient Position (if appropriate): Sitting Oxygen Therapy SpO2: 98 % O2 Device: Room Air    Therapy/Group: Individual Therapy  Maribella Kuna   Kaipo Ardis, PTA  07/14/2020, 4:21 PM

## 2020-07-14 NOTE — Progress Notes (Signed)
ANTICOAGULATION CONSULT NOTE - Follow-Up Consult  Pharmacy Consult for Warfarin Indication: mechanical MVR  Allergies  Allergen Reactions  . Zoloft [Sertraline] Hives, Itching and Rash    Patient Measurements: Height: 5\' 7"  (170.2 cm) Weight: 59.2 kg (130 lb 8.2 oz) IBW/kg (Calculated) : 61.6  Vital Signs: Temp: 97.6 F (36.4 C) (09/06 0451) BP: 101/70 (09/06 0451) Pulse Rate: 84 (09/06 0451)  Labs: Recent Labs    07/11/20 1011 07/14/20 0620  HGB  --  12.8  HCT  --  39.8  PLT  --  176  LABPROT 32.1* 31.4*  INR 3.3* 3.2*    Estimated Creatinine Clearance: 57.7 mL/min (by C-G formula based on SCr of 0.8 mg/dL).   Medical History: Past Medical History:  Diagnosis Date  . Abnormality of gait 09/07/2016  . Allergy   . Diabetes mellitus without complication [facility])    Patient denies this - notes history of glucose intolerance  . GERD (gastroesophageal reflux disease)   . Hemiparesis and alteration of sensations as late effects of stroke ([place]) 09/07/2016  . History of pneumonia 1997  . Hypertension   . S/P MVR (mitral valve replacement)    Mechanical mitral valve replacement at age 2 (done in [place])  // echo 7/17: EF 55-60, normal wall motion, bileaflet mechanical mitral valve prosthesis functioning normally, mild LAE, mildly reduced RVSF, small pericardial effusion  . Stroke [facility]) 1997, 2013, 2015    Medications:  See electronic med rec  Assessment 74 yo female presented [facility] ED on 07/01/20 with dizziness, with acute ischemic stroke. PTA the patient is on warfarin for mechanical MVR and h/o hemispheric strokes. INR on admit 8/23 was therapeutic (3.2). Neurology agreed with continuing warfarin due benefits for heart valve outweigh due to low risk for hemorrhagic transformation with small stroke size. Last took prior to admission dose on 8/23. No warfarin given on 8/24. Warfarin resumed 8/25. Pharmacy consulted to dose warfarin.  Patient was [facility] and  transferred/admitted to CIR on 07/04/20  The patient's INR is 3.2 today, remains therapeutic on home regimen. Goal INR 3-3.5 for mechanical MVR and h/o hemispheric strokes. No bleeding reported. CBC today remains within normal / stable. Continues with good meal intake. There are no contraindicated drug interactions identified except increased of bleeding with Aspirin 81 mg daily.   PTA dose: Warfarin PO 5 mg except 7.5 mg Mon and Fri and patient states that INR fluctuates at home. (pta last taken 8/23)  Goal of Therapy:  INR goal 3-3.5 Monitor platelets by anticoagulation protocol: Yes   Plan:  Warfarin 5 mg daily except 7.5 mg every Mon and Friday (resuming PTA home dosage regimen)  Check INR every Mon, Wed, Fri.  CBC every Monday - Monitor for signs and symptoms of bleeding - Monitor drug interactions and oral intake    Rebbeca Paul, PharmD PGY1 Pharmacy Resident 07/14/2020 9:50 AM  Please check [email or web address] for unit-specific pharmacy phone numbers.

## 2020-07-15 ENCOUNTER — Inpatient Hospital Stay (HOSPITAL_COMMUNITY): Payer: Medicare Other | Admitting: Occupational Therapy

## 2020-07-15 ENCOUNTER — Inpatient Hospital Stay (HOSPITAL_COMMUNITY): Payer: Medicare Other | Admitting: Physical Therapy

## 2020-07-15 MED ORDER — AMLODIPINE BESYLATE 5 MG PO TABS: 5.0000 mg | ORAL_TABLET | Freq: Every day | ORAL | 0 refills | Status: DC

## 2020-07-15 MED ORDER — PANTOPRAZOLE SODIUM 40 MG PO TBEC: 40.0000 mg | DELAYED_RELEASE_TABLET | Freq: Every day | ORAL | 1 refills | Status: DC

## 2020-07-15 MED ORDER — ATORVASTATIN CALCIUM 40 MG PO TABS: 40.0000 mg | ORAL_TABLET | Freq: Every day | ORAL | 2 refills | Status: DC

## 2020-07-15 NOTE — Progress Notes (Signed)
Occupational Therapy Discharge Summary  Patient Details  Name: [name] MRN: [id number] Date of Birth: 1946/04/24  Today's Date: 07/15/2020 OT Individual Time: 1004-1106 OT Individual Time Calculation (min): 62 min   Session 1:  ([id number])  Pt up in the wheelchair to start session.  Spouse present, so therapist provided education verbally and through demonstration on pt's current level of assist with transfers using the single point cane.  Emphasized need to integrate the LUE more for sit to stand transitions and reviewed sit to stand transitions with use of the cane.  Also emphasized the need to use the gait belt along with the cane during transfers and to stay on her left side as this was the side more likely for her to lose her balance.  Pt's spouse is familiar with assisting pt with mobility from previous visits and will feel comfortable with this at home.  Pt is overall supervision level for all selfcare tasks and transfers.  Next, had pt ambulate down to the dayroom with use of the cane at supervision level.  There she transitioned to right sidelying on the therapy mat.  Therapist completed scapular mobilizations to the left shoulder as well as stretching to the internal rotators of the shoulder and the biceps.  He then had her transition to sitting where she placed the LUE on top of a yoga block while working on lateral weightshifts needed for reciprically scooting the left hip forward and backwards.  Increased tightness was noted in the left trunk with mod facilitation needed for left trunk shortening and reciprical scooting secondary to pt over activating the right side to complete movement.  Finished session with return to the room at supervision level ambulating with use of the cane.  She was left sitting up in the wheelchair with the call button and the phone in reach and safety belt in place.    Session 2 [phone])  Pt completed functional mobility back down to the day room with  supervision using the RW for support.  Focused session on left trunk mobility and lateral weight shifts to the right while reaching with the RUE.  Emphasis also on using the LUE to push into the surface to assist with hip hike on the left during weightshift to the right.  Increased tone noted in the left biceps making it difficult to maintain contact with the surface under the left hand.  Min to mod assist needed to complete adequate weight shift to the right for simulated reaching task.  Educated pt to work on sitting in her armed chair at home and continue working on this for scooting forward and backwards reciprically.  Returned to the room at the end of the session with pt left sitting up and call button and phone in reach.     Patient has met 9 of 9 long term goals due to improved activity tolerance, improved balance, ability to compensate for deficits and functional use of  LEFT upper extremity.  Patient to discharge at overall Supervision level.  Patient's care partner is independent to provide the necessary physical assistance at discharge.    Reasons goals not met: NA  Recommendation:  Patient will benefit from ongoing skilled OT services in outpatient setting to continue to advance functional skills in the area of BADL and Reduce care partner burden.  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  Feel she will benefit from continued outpatient OT to address these deficits in  order to increase overall functional use of the LUE as well as increasing posture, strength, and overall ADL independence.    Equipment: No equipment provided  Reasons for discharge: treatment goals met and discharge from hospital  Patient/family agrees with progress made and goals achieved: Yes  OT Discharge Precautions/Restrictions  Precautions Precautions: Fall;Other (comment) Precaution Comments: LUE and LLE hemiparesis with impaired  coordination Required Braces or Orthoses: Other Brace Other Brace: L AFO Restrictions Weight Bearing Restrictions: No  Pain Pain Assessment Pain Scale: Faces Pain Score: 0-No pain ADL ADL Eating: Independent Where Assessed-Eating: Wheelchair Grooming: Independent Where Assessed-Grooming: Wheelchair Upper Body Bathing: Supervision/safety Where Assessed-Upper Body Bathing: Wheelchair Lower Body Bathing: Supervision/safety Where Assessed-Lower Body Bathing: Wheelchair Upper Body Dressing: Setup Where Assessed-Upper Body Dressing: Wheelchair Lower Body Dressing: Supervision/safety Where Assessed-Lower Body Dressing: Wheelchair Toileting: Supervision/safety Where Assessed-Toileting: Glass blower/designer: Close supervision Toilet Transfer Method: Arts development officer: Raised toilet seat Tub/Shower Transfer: Not assessed Social research officer, government: Not assessed Vision Baseline Vision/History: Wears glasses;Cataracts Wears Glasses: At all times Patient Visual Report: No change from baseline Vision Assessment?: Yes Eye Alignment: Within Functional Limits Ocular Range of Motion: Within Functional Limits Alignment/Gaze Preference: Within Defined Limits Tracking/Visual Pursuits: Decreased smoothness of vertical tracking;Decreased smoothness of horizontal tracking;Other (comment) Convergence: Impaired (comment) Visual Fields: No apparent deficits Perception  Perception: Within Functional Limits Praxis Praxis: Intact Cognition Overall Cognitive Status: Within Functional Limits for tasks assessed Arousal/Alertness: Awake/alert Attention: Focused;Sustained Focused Attention: Appears intact Sustained Attention: Appears intact Selective Attention: Appears intact Memory: Appears intact Memory Recall Sock: Without Cue Memory Recall Blue: Without Cue Memory Recall Bed: Without Cue Awareness: Appears intact Problem Solving: Appears  intact Sensation Sensation Light Touch: Impaired Detail Light Touch Impaired Details: Impaired LUE Hot/Cold Impaired Details: Impaired LUE Proprioception: Impaired Detail Proprioception Impaired Details: Impaired LUE Stereognosis: Not tested Coordination Gross Motor Movements are Fluid and Coordinated: No Fine Motor Movements are Fluid and Coordinated: No Coordination and Movement Description: Pt still demonstrates decreased gross and FM coordination in the left arm and hand with some increased tone noted at the shoulder, wrist, and hand.  She uses the LUE functionally at a diminshed level for selfcare tasks but demonstrates decreased ability to maintain her palm on the surface of the bed or mat with sit to stand tasks secondary to increased biceps tone. Motor  Motor Motor: Hemiplegia;Abnormal tone;Abnormal postural alignment and control Motor - Discharge Observations: Mild L hemiparesis with abnormal tone Mobility  Bed Mobility Supine to Sit: Independent Sit to Supine: Independent Transfers Sit to Stand: Supervision/Verbal cueing Stand to Sit: Supervision/Verbal cueing  Trunk/Postural Assessment  Cervical Assessment Cervical Assessment: Exceptions to [facility] (cervical protraction and flexion) Thoracic Assessment Thoracic Assessment: Exceptions to [facility] (thoracic rounding) Lumbar Assessment Lumbar Assessment: Exceptions to [facility] (posterior pelvic tilt)  Balance Balance Balance Assessed: Yes Static Sitting Balance Static Sitting - Balance Support: Feet supported Static Sitting - Level of Assistance: 7: Independent Dynamic Sitting Balance Dynamic Sitting - Balance Support: Feet supported Dynamic Sitting - Level of Assistance: 5: Stand by assistance Static Standing Balance Static Standing - Balance Support: During functional activity Static Standing - Level of Assistance: 6: Modified independent (Device/Increase time) Dynamic Standing Balance Dynamic Standing - Balance Support:  During functional activity;Right upper extremity supported Dynamic Standing - Level of Assistance: 5: Stand by assistance Extremity/Trunk Assessment RUE Assessment RUE Assessment: Within Functional Limits LUE Assessment LUE Assessment: Exceptions to [facility] General Strength Comments: AROM shoulder flexion 0-110 degrees, all other joints AROM WFLS with slight increased  tone noted in the biceps, triceps, and wrist flexors.  Needs cueing to integrate into sit to stand and selfcare tasks secondary to learned non-use.   Aron Needles OTR/L 07/15/2020, 4:41 PM

## 2020-07-15 NOTE — Progress Notes (Signed)
Physical Therapy Discharge Summary  Patient Details  Name: [name] MRN: [id number] Date of Birth: 10/11/1946  Today's Date: 07/15/2020 PT Individual Time: [phone] and [id number] PT Individual Time Calculation (min): 64 min and 71 min   Patient has met 8 of 8 long term goals due to improved activity tolerance, improved balance, improved postural control, increased strength, ability to compensate for deficits, functional use of  left upper extremity and left lower extremity, improved awareness and improved coordination.  Patient to discharge at an ambulatory level Supervision using SPC.   Patient's care partner is independent to provide the necessary physical assistance at discharge.  All goals met.  Recommendation:  Patient will benefit from ongoing skilled PT services in outpatient setting to continue to advance safe functional mobility, address ongoing impairments in dynamic standing balance, gait training, L LE NMR, L LE strengthening, and minimize fall risk.  Equipment: No equipment provided - pt has all necessary DME  Reasons for discharge: treatment goals met and discharge from hospital  Patient/family agrees with progress made and goals achieved: Yes  Skilled Therapeutic Interventions/Progress Updates:  Session 1: Pt received supine in bed and agreeable to therapy session but requesting to get cleaned up at sink prior to leaving room. Discussed plan for reassessment of Berg Balance Test today to help determine appropriate AD recommendation for discharge - pt in agreement. Supine>sit independently. R stand pivot to w/c using RW with supervision. Sitting in w/c at sink performed UB and LB bathing and dressing performing sit<>stands using UE support on sink, as needed, with supervision. Gait training ~143ft x2 to/from therapy gym uisng RW with supervision - pt noted to kick L posterior RW leg with L LE during swing due to slight circumduction, cuing for improved hip/knee flexion  during swing. Pt continues to have impaired L LE coordination during swing due to spasticity/tone. Patient participated in [facility] and demonstrates increased fall risk as noted by score of   42/56.  (<36= high risk for falls, close to 100%; 37-45 significant >80%; 46-51 moderate >50%; 52-55 lower >25%). Discussed results of test with pt's increased score and indication of ability to progress to [facility] for ambulation but recommendation to use RW when fatigued or feeling unsteady. At end of session pt left sitting in w/c with her husband present and needs in reach.  Session 2: Pt received sitting in w/c with her husband present and pt agreeable to therapy session. Pt's husband agreeable to attend session for hands-on training/education. Therapist educated pt/husband on improved Berg Balance test indicating pt is safe to use [facility] for ambulation; however, therapist educated that if pt is feeling more unsteady or fatigued on any given day that the RW would be more secure - pt/family verbalize understanding. Sit<>stands using SPC with supervision. Gait training ~220ft to ortho gym using [facility] with supervision - therapist reinforcing to husband on safe positioning to provide CGA for safety if it were needed. Ambulatory car transfer (sedan height) using SPC with supervision - pt's husband demoing proper position for safety. Ambulated ~56ft up/down ramp using SPC with pt self-selecting to hold onto railing as opposed to using [facility] for support with close supervision. Ascended/descended 12 steps using B HRs with therapist educating on performing reciprocal pattern on ascent and step-to pattern on descent leading with L LE - CGA for steadying throughout and min cuing for sequencing - pt's husband demoing proper assistance with min progressed to no cuing; however, pt has no stairs she needs to perform at  her house.  Provided patient with the following HEP: - Supine Bridge with Resistance Band - 1 x daily - 7 x weekly - 2  sets - 15 reps - Sit to Stand with Arms Crossed - 1 x daily - 7 x weekly - 2 sets - 12 reps - Side Stepping with Counter Support - 1 x daily - 7 x weekly - 3 sets - 10 reps - Alternating Step Taps with Counter Support - 1 x daily - 7 x weekly - 2 sets - 20 reps - Romberg Stance - 1 x daily - 7 x weekly - 2 sets - 30 seconds hold - Standing Romberg to 1/2 Tandem Stance - 1 x daily - 7 x weekly - 2 sets - 30 seconds hold Performed 1 set of all but the bridging exercise with therapist training pt's husband on proper positioning to provide assistance - therapist providing cuing throughout for pt's proper form/technique. Discussed having pt performing the 2 static standing balance tasks in the corner of a room with her back ~31foot away from the wall and her husband guarding her. Gait training >1,064ft using SPC outside to practice curb step navigation - pt's husband providing proper CGA for safety on the curb and otherwise close supervision/CGA for safety on unlevel surfaces outside - therapist educating on proper sequencing of AD management and LE stepping on curb. At end of session pt/husband report no questions/concerns and report feeling confident with discharge plan and training. Pt left seated in w/c with needs in reach and her husband present.  PT Discharge Precautions/Restrictions Precautions Precautions: Fall;Other (comment) Precaution Comments: LUE and LLE hemiparesis with impaired coordination Restrictions Weight Bearing Restrictions: No Pain Pain Assessment Pain Scale: 0-10 Pain Score: 0-No pain Perception  Perception Perception: Within Functional Limits Praxis Praxis: Intact  Cognition Overall Cognitive Status: Within Functional Limits for tasks assessed Arousal/Alertness: Awake/alert Orientation Level: Oriented X4 Attention: Focused;Sustained Focused Attention: Appears intact Sustained Attention: Appears intact Selective Attention: Appears intact Memory: Appears  intact Safety/Judgment: Appears intact Sensation Sensation Light Touch: Impaired Detail Light Touch Impaired Details: Impaired LLE Hot/Cold: Not tested Proprioception: Impaired Detail Proprioception Impaired Details: Impaired LLE Stereognosis: Not tested Coordination Gross Motor Movements are Fluid and Coordinated: No Coordination and Movement Description: impaired coordination in L hemibody with impaired proprioception and abnormal tone in L LE Motor  Motor Motor: Hemiplegia;Abnormal tone Motor - Discharge Observations: Mild L hemiparesis with abnormal tone  Mobility  Bed Mobility Bed Mobility: Sit to Supine;Supine to Sit Supine to Sit: Independent Sit to Supine: Independent Transfers Transfers: Sit to Stand;Stand to Sit;Stand Pivot Transfers Sit to Stand: Supervision/Verbal cueing Stand to Sit: Supervision/Verbal cueing Stand Pivot Transfers: Supervision/Verbal cueing Transfer (Assistive device): Straight cane Locomotion  Gait Ambulation: Yes Gait Assistance: Supervision/Verbal cueing Gait Distance (Feet): 250 Feet Assistive device: Straight cane Gait Assistance Details: Verbal cues for gait pattern Gait Gait: Yes Gait Pattern: Impaired Gait Pattern: Decreased step length - right;Decreased step length - left;Decreased stance time - left;Decreased stride length;Decreased hip/knee flexion - left;Poor foot clearance - left (wears L AFO baseline) Gait velocity: decreased with guarded trunk Stairs / Additional Locomotion Stairs: Yes Stairs Assistance: Contact Guard/Touching assist Stair Management Technique: Two rails Number of Stairs: 12 Height of Stairs: 6 Ramp: Contact Guard/touching assist Curb: Contact Guard/Touching assist Wheelchair Mobility Wheelchair Mobility: No  Trunk/Postural Assessment  Cervical Assessment Cervical Assessment: Exceptions to [facility] (cervical protraction and flexion) Thoracic Assessment Thoracic Assessment: Exceptions to [facility] (thoracic  rounding with scoliosis) Lumbar Assessment Lumbar Assessment: Exceptions  to [facility] (posterior pelvic tilt in sitting) Postural Control Postural Control: Within Functional Limits (using SPC)  Balance Balance Balance Assessed: Yes Standardized Balance Assessment Standardized Balance Assessment: Berg Balance Test Berg Balance Test Sit to Stand: Able to stand without using hands and stabilize independently Standing Unsupported: Able to stand safely 2 minutes Sitting with Back Unsupported but Feet Supported on Floor or Stool: Able to sit safely and securely 2 minutes Stand to Sit: Sits safely with minimal use of hands Transfers: Able to transfer safely, minor use of hands Standing Unsupported with Eyes Closed: Able to stand 10 seconds safely Standing Ubsupported with Feet Together: Able to place feet together independently and stand for 1 minute with supervision From Standing, Reach Forward with Outstretched Arm: Can reach forward >12 cm safely (5") From Standing Position, Pick up Object from Floor: Able to pick up shoe safely and easily From Standing Position, Turn to Look Behind Over each Shoulder: Looks behind one side only/other side shows less weight shift Turn 360 Degrees: Able to turn 360 degrees safely but slowly Standing Unsupported, Alternately Place Feet on Step/Stool: Able to complete >2 steps/needs minimal assist Standing Unsupported, One Foot in Front: Able to take small step independently and hold 30 seconds Standing on One Leg: Unable to try or needs assist to prevent fall Total Score: 42 Static Sitting Balance Static Sitting - Balance Support: Feet supported Static Sitting - Level of Assistance: 7: Independent Dynamic Sitting Balance Dynamic Sitting - Balance Support: Feet supported Dynamic Sitting - Level of Assistance: 5: Stand by assistance Static Standing Balance Static Standing - Balance Support: During functional activity Static Standing - Level of Assistance: 6:  Modified independent (Device/Increase time) Dynamic Standing Balance Dynamic Standing - Balance Support: During functional activity;Right upper extremity supported Dynamic Standing - Level of Assistance: 5: Stand by assistance Extremity Assessment      RLE Assessment RLE Assessment: Exceptions to [facility] Active Range of Motion (AROM) Comments: WFL RLE Strength Right Hip Flexion: 4-/5 Right Knee Flexion: 4+/5 Right Knee Extension: 4+/5 Right Ankle Dorsiflexion: 4/5 Right Ankle Plantar Flexion: 4-/5 RLE Tone RLE Tone: Other (comment) (clonus) LLE Assessment LLE Assessment: Exceptions to [facility] General Strength Comments: though has ankle movement pt wears L AFO baseline LLE Strength Left Hip Flexion: 4/5 Left Knee Flexion: 4/5 Left Knee Extension: 4/5 Left Ankle Dorsiflexion: 2/5 (with inversion bias) Left Ankle Plantar Flexion: 2/5 LLE Tone LLE Tone: Modified Ashworth Body Part - Modified Ashworth Scale: Hamstrings;Quadriceps Hamstrings - Modified Ashworth Scale for Grading Hypertonia LLE: Slight increase in muscle tone, manifested by a catch and release or by minimal resistance at the end of the range of motion when the affected part(s) is moved in flexion or extension Quadriceps - Modified Ashworth Scale for Grading Hypertonia LLE: Slight increase in muscle tone, manifested by a catch, followed by minimal resistance throughout the remainder (less than half) of the ROM LLE Tone Comments: 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

## 2020-07-15 NOTE — Progress Notes (Signed)
[place] PHYSICAL MEDICINE & REHABILITATION PROGRESS NOTE   Subjective/Complaints:  Pt aware of D/C in am , discussed usual d/c routine   ROS:   Pt denies SOB, abd pain, CP, N/V/C/D, and vision changes   Objective:   No results found. Recent Labs    07/14/20 0620  WBC 3.5*  HGB 12.8  HCT 39.8  PLT 176   No results for input(s): NA, K, CL, CO2, GLUCOSE, BUN, CREATININE, CALCIUM in the last 72 hours.  Intake/Output Summary (Last 24 hours) at 07/15/2020 0758 Last data filed at 07/14/2020 1437 Gross per 24 hour  Intake 240 ml  Output --  Net 240 ml     Physical Exam: Vital Signs Blood pressure 109/73, pulse 83, temperature (!) 97.5 F (36.4 C), resp. rate 16, height 5\' 7"  (1.702 m), weight 59.2 kg, SpO2 97 %.   Neurologic: 5/5 on RIght side 4/5 on left except L ADF 3- LUE fine motor deficits, also with LUE dysdiadochokinesis  Psych: Pt's affect is appropriate. Pt is cooperative, cannot appear to understand about Protonix issue as above.    Assessment/Plan: 1. Functional deficits secondary to left frontal  infarction which require 3+ hours per day of interdisciplinary therapy in a comprehensive inpatient rehab setting.  Physiatrist is providing close team supervision and 24 hour management of active medical problems listed below.  Physiatrist and rehab team continue to assess barriers to discharge/monitor patient progress toward functional and medical goals  Care Tool:  Bathing    Body parts bathed by patient: Left arm, Chest, Right arm, Left upper leg, Right lower leg, Left lower leg, Abdomen, Front perineal area, Face, Buttocks, Right upper leg         Bathing assist Assist Level: Minimal Assistance - Patient > 75%     Upper Body Dressing/Undressing Upper body dressing   What is the patient wearing?: Bra, Pull over shirt    Upper body assist Assist Level: Set up assist    Lower Body Dressing/Undressing Lower body dressing      What is the patient  wearing?: Underwear/pull up, Pants     Lower body assist Assist for lower body dressing: Contact Guard/Touching assist     Toileting Toileting    Toileting assist Assist for toileting: Moderate Assistance - Patient 50 - 74%     Transfers Chair/bed transfer  Transfers assist     Chair/bed transfer assist level: Contact Guard/Touching assist     Locomotion Ambulation   Ambulation assist      Assist level: Contact Guard/Touching assist Assistive device: Walker-rolling Max distance: 250   Walk 10 feet activity   Assist     Assist level: Contact Guard/Touching assist Assistive device: Walker-rolling   Walk 50 feet activity   Assist    Assist level: Contact Guard/Touching assist Assistive device: Walker-rolling    Walk 150 feet activity   Assist    Assist level: Contact Guard/Touching assist Assistive device: Walker-rolling    Walk 10 feet on uneven surface  activity   Assist     Assist level: Minimal Assistance - Patient > 75% Assistive device: Other (comment) (using railing support)   Wheelchair     Assist Will patient use wheelchair at discharge?: No             Wheelchair 50 feet with 2 turns activity    Assist            Wheelchair 150 feet activity     Assist  Assist Level: Minimal Assistance - Patient > 75%   Blood pressure 109/73, pulse 83, temperature (!) 97.5 F (36.4 C), resp. rate 16, height 5\' 7"  (1.702 m), weight 59.2 kg, SpO2 97 %.   Medical Problem List and Plan: 1.  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  Antithrombotics: -DVT/anticoagulation: Chronic Coumadin for  history of MVR.  Goal INR 3.0-3.5.                -antiplatelet therapy: Aspirin 81 mg daily 3. Pain Management: Tylenol as needed.   9/4- well controlled with tylenol- con't regimen prn 4. Mood: Advised emotional support             -antipsychotic agents: N/A 5. Neuropsych: This patient is capable of making decisions on her own behalf. Some mild memory issues    9/5- anomia noted today- husband feels she's at baseline.  6. Skin/Wound Care: Routine skin checks. May remove IV 8/27. 7. Fluids/Electrolytes/Nutrition: Routine in and outs with follow-up chemistries 8.  Hypertension.  Norvasc 10 mg daily.  Monitor with increased mobility.  Norvasc to 5mg .  Vitals:   07/14/20 2010 07/15/20 0524  BP: 127/76 109/73  Pulse: 76 83  Resp: 16 16  Temp: 97.9 F (36.6 C) (!) 97.5 F (36.4 C)  SpO2: 98% 97%  Controlled 9/7 9.  Hyperlipidemia.  Lipitor 10.  History of mitral valve replacement.  INR goal 3-3.5 INR pending   9/4- INR 3.3  9/6 INR 3.2    11.  Prediabetes.  Hemoglobin A1c 6.1.  Blood sugar checks discontinued 12.  GERD.  Protonix  LOS: 11 days A FACE TO FACE EVALUATION WAS PERFORMED  Charlett Blake 07/15/2020, 7:58 AM

## 2020-07-16 ENCOUNTER — Other Ambulatory Visit: Payer: Self-pay

## 2020-07-16 LAB — PROTIME-INR
INR: 3.4 — ABNORMAL HIGH (ref 0.8–1.2)
Prothrombin Time: 33.5 seconds — ABNORMAL HIGH (ref 11.4–15.2)

## 2020-07-16 MED ORDER — WARFARIN SODIUM 5 MG PO TABS: 5.0000 mg | ORAL_TABLET | Freq: Every day | ORAL | 0 refills | Status: DC

## 2020-07-16 NOTE — Progress Notes (Signed)
[place] PHYSICAL MEDICINE & REHABILITATION PROGRESS NOTE   Subjective/Complaints:  No issues overnite , pt feels ready for d/c  ROS:   Pt denies SOB, abd pain, CP, N/V/D,   Objective:   No results found. Recent Labs    07/14/20 0620  WBC 3.5*  HGB 12.8  HCT 39.8  PLT 176   No results for input(s): NA, K, CL, CO2, GLUCOSE, BUN, CREATININE, CALCIUM in the last 72 hours.  Intake/Output Summary (Last 24 hours) at 07/16/2020 0850 Last data filed at 07/15/2020 2159 Gross per 24 hour  Intake 840 ml  Output --  Net 840 ml     Physical Exam: Vital Signs Blood pressure 127/76, pulse 69, temperature (!) 97.5 F (36.4 C), temperature source Oral, resp. rate 16, height 5\' 7"  (1.702 m), weight 59.2 kg, SpO2 99 %.   General: No acute distress Mood and affect are appropriate Heart: Regular rate and rhythm no rubs murmurs or extra sounds Lungs: Clear to auscultation, breathing unlabored, no rales or wheezes Abdomen: Positive bowel sounds, soft nontender to palpation, nondistended Extremities: No clubbing, cyanosis, or edema Skin: No evidence of breakdown, no evidence of rash   Neurologic: 5/5 on RIght side 4/5 on left except L ADF 3- LUE fine motor deficits, also with LUE dysdiadochokinesis  Psych: Pt's affect is appropriate. Pt is cooperative, cannot appear to understand about Protonix issue as above.    Assessment/Plan: 1. Functional deficits secondary to left frontal  Infarction Stable for D/C today F/u PCP in 3-4 weeks F/u PM&R 2 weeks See D/C summary See D/C instructionsCare Tool:  Bathing    Body parts bathed by patient: Left arm, Chest, Right arm, Left upper leg, Right lower leg, Left lower leg, Abdomen, Front perineal area, Face, Buttocks, Right upper leg         Bathing assist Assist Level: Supervision/Verbal cueing     Upper Body Dressing/Undressing Upper body dressing   What is the patient wearing?: Bra, Pull over shirt    Upper body assist Assist  Level: Set up assist    Lower Body Dressing/Undressing Lower body dressing      What is the patient wearing?: Underwear/pull up, Pants     Lower body assist Assist for lower body dressing: Supervision/Verbal cueing     Toileting Toileting    Toileting assist Assist for toileting: Supervision/Verbal cueing     Transfers Chair/bed transfer  Transfers assist     Chair/bed transfer assist level: Supervision/Verbal cueing     Locomotion Ambulation   Ambulation assist      Assist level: Supervision/Verbal cueing Assistive device: Cane-straight Max distance: 250ft   Walk 10 feet activity   Assist     Assist level: Supervision/Verbal cueing Assistive device: Cane-straight   Walk 50 feet activity   Assist    Assist level: Supervision/Verbal cueing Assistive device: Cane-straight    Walk 150 feet activity   Assist    Assist level: Supervision/Verbal cueing Assistive device: Cane-straight    Walk 10 feet on uneven surface  activity   Assist     Assist level: Contact Guard/Touching assist Assistive device: Cane-straight   Wheelchair     Assist Will patient use wheelchair at discharge?: No             Wheelchair 50 feet with 2 turns activity    Assist            Wheelchair 150 feet activity     Assist      Assist Level:  Minimal Assistance - Patient > 75%   Blood pressure 127/76, pulse 69, temperature (!) 97.5 F (36.4 C), temperature source Oral, resp. rate 16, height 5\' 7"  (1.702 m), weight 59.2 kg, SpO2 99 %.   Medical Problem List and Plan: 1.  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  Antithrombotics: -DVT/anticoagulation:  Chronic Coumadin for history of MVR.  Goal INR 3.0-3.5.                -antiplatelet therapy: Aspirin 81 mg daily 3. Pain Management: Tylenol as needed.   9/4- well controlled with tylenol- con't regimen prn 4. Mood: Advised emotional support             -antipsychotic agents: N/A 5. Neuropsych: This patient is capable of making decisions on her own behalf. Some mild memory issues    9/5- anomia noted today- husband feels she's at baseline.  6. Skin/Wound Care: Routine skin checks. May remove IV 8/27. 7. Fluids/Electrolytes/Nutrition: Routine in and outs with follow-up chemistries 8.  Hypertension.  Norvasc 10 mg daily.  Monitor with increased mobility.  Norvasc to 5mg .  Vitals:   07/15/20 2029 07/16/20 0450  BP: 110/75 127/76  Pulse: 96 69  Resp: 16 16  Temp: 97.6 F (36.4 C) (!) 97.5 F (36.4 C)  SpO2: 96% 99%  Controlled 9/8  9.  Hyperlipidemia.  Lipitor 10.  History of mitral valve replacement.  INR goal 3-3.5 INR pending   9/4- INR 3.3  9/6 INR 3.2- pending today recheck next week     11.  Prediabetes.  Hemoglobin A1c 6.1.  Blood sugar checks discontinued 12.  GERD.  Protonix  LOS: 12 days A FACE TO FACE EVALUATION WAS PERFORMED  [name] 07/16/2020, 8:50 AM

## 2020-07-16 NOTE — Progress Notes (Signed)
ANTICOAGULATION CONSULT NOTE - Follow-Up Consult  Pharmacy Consult for Warfarin Indication: mechanical MVR  Allergies  Allergen Reactions  . Zoloft [Sertraline] Hives, Itching and Rash    Patient Measurements: Height: 5\' 7"  (170.2 cm) Weight: 59.2 kg (130 lb 8.2 oz) IBW/kg (Calculated) : 61.6  Vital Signs: Temp: 97.5 F (36.4 C) (09/08 0450) Temp Source: Oral (09/08 0450) BP: 127/76 (09/08 0450) Pulse Rate: 69 (09/08 0450)  Labs: Recent Labs    07/14/20 0620  HGB 12.8  HCT 39.8  PLT 176  LABPROT 31.4*  INR 3.2*    Estimated Creatinine Clearance: 57.7 mL/min (by C-G formula based on SCr of 0.8 mg/dL).   Assessment 74 yo W on warfarin PTA for mechanical MVR and h/o hemispheric strokes. INR on admit 8/23 was therapeutic (3.2). Neurology agreed with continuing warfarin due benefits for heart valve outweigh due to low risk for hemorrhagic transformation with small stroke size. Last took prior to admission dose on 8/23. No warfarin given on 8/24. Warfarin resumed 8/25.  INR 3.4 at goal on home dose. H/H, plt stable  PTA dose: Warfarin PO 5 mg except 7.5 mg Mon and Fri and patient states that INR fluctuates at home. (pta last taken 8/23)  Goal of Therapy:  INR goal 3-3.5 Monitor platelets by anticoagulation protocol: Yes   Plan:  Warfarin 5 mg daily except 7.5 mg every Mon and Friday (PTA home dosage regimen)  F/u INR MWF, CBC Q Mon   Monitor for signs and symptoms of bleeding F/u discharge, would suggest resuming home dose (Warfarin 5 mg daily except 7.5 mg every Mon and Friday) with INR check in 7-14 days      Benetta Spar, PharmD, BCPS, [facility] Clinical Pharmacist  Please check AMION for all [place] phone numbers After 10:00 PM, call [place]

## 2020-07-16 NOTE — Progress Notes (Signed)
Patient discharged to home with husband. Patient stated that they fully understood discharge instructions.

## 2020-07-16 NOTE — Plan of Care (Signed)
Problem: Consults Goal: RH STROKE PATIENT EDUCATION Description: See Patient Education module for education specifics. < Outcome: Completed/Met   Problem: RH SKIN INTEGRITY Goal: RH STG SKIN FREE OF INFECTION/BREAKDOWN Description: Skin will remain free of breakdown and infection while on rehab with min assist Outcome: Completed/Met   Problem: RH SAFETY Goal: RH STG ADHERE TO SAFETY PRECAUTIONS W/ASSISTANCE/DEVICE Description: STG Adhere to Safety Precautions With Assistance/Device.cues/supervision Outcome: Completed/Met   Problem: RH PAIN MANAGEMENT Goal: RH STG PAIN MANAGED AT OR BELOW PT'S PAIN GOAL Description: Less than 4 Outcome: Completed/Met   Problem: RH KNOWLEDGE DEFICIT Goal: RH STG INCREASE KNOWLEDGE OF DIABETES Description: Patient will be able to manage DM with diet and exercise using handouts and educational resources independently Outcome: Completed/Met Goal: RH STG INCREASE KNOWLEDGE OF HYPERTENSION Description: Patient will be able to manage HTN with diet and exercise using handouts and educational resources independently Outcome: Completed/Met Goal: RH STG INCREASE KNOWLEGDE OF HYPERLIPIDEMIA Description: Patient will be able to manage HLDwith diet and exercise using handouts and educational resources independently Outcome: Completed/Met Goal: RH STG INCREASE KNOWLEDGE OF STROKE PROPHYLAXIS Description: Patient will be able to manage secondary stroke prevention and demo understanding of prophylaxis using handouts and educational resources independently Outcome: Completed/Met   Problem: Consults Goal: RH STROKE PATIENT EDUCATION Description: See Patient Education module for education specifics. < Outcome: Completed/Met   Problem: RH SKIN INTEGRITY Goal: RH STG SKIN FREE OF INFECTION/BREAKDOWN Description: Skin will remain free of breakdown and infection while on rehab with min assist Outcome: Completed/Met   Problem: RH SAFETY Goal: RH STG ADHERE TO  SAFETY PRECAUTIONS W/ASSISTANCE/DEVICE Description: STG Adhere to Safety Precautions With Assistance/Device.cues/supervision Outcome: Completed/Met   Problem: RH PAIN MANAGEMENT Goal: RH STG PAIN MANAGED AT OR BELOW PT'S PAIN GOAL Description: Less than 4 Outcome: Completed/Met   Problem: RH KNOWLEDGE DEFICIT Goal: RH STG INCREASE KNOWLEDGE OF DIABETES Description: Patient will be able to manage DM with diet and exercise using handouts and educational resources independently Outcome: Completed/Met Goal: RH STG INCREASE KNOWLEDGE OF HYPERTENSION Description: Patient will be able to manage HTN with diet and exercise using handouts and educational resources independently Outcome: Completed/Met Goal: RH STG INCREASE KNOWLEGDE OF HYPERLIPIDEMIA Description: Patient will be able to manage HLDwith diet and exercise using handouts and educational resources independently Outcome: Completed/Met Goal: RH STG INCREASE KNOWLEDGE OF STROKE PROPHYLAXIS Description: Patient will be able to manage secondary stroke prevention and demo understanding of prophylaxis using handouts and educational resources independently Outcome: Completed/Met

## 2020-07-16 NOTE — Progress Notes (Signed)
Inpatient Rehabilitation Care Coordinator  Discharge Note  The overall goal for the admission was met for:   Discharge location: Yes, Home  Length of Stay: Yes, 12 years  Discharge activity level: Yes, independent   Home/community participation: Yes  Services provided included: MD, RD, PT, OT, SLP, RN, CM, TR, Pharmacy, Neuropsych and SW  Financial Services: Medicare  Follow-up services arranged: Outpatient: [facility]  Comments (or additional information): PT OT  Patient/Family verbalized understanding of follow-up arrangements: Yes  Individual responsible for coordination of the follow-up plan: [name] [phone]  Confirmed correct DME delivered: [name] 07/16/2020    [name]

## 2020-07-18 ENCOUNTER — Telehealth: Payer: Self-pay

## 2020-07-18 ENCOUNTER — Ambulatory Visit (INDEPENDENT_AMBULATORY_CARE_PROVIDER_SITE_OTHER): Payer: Medicare Other

## 2020-07-18 ENCOUNTER — Encounter: Payer: Self-pay | Admitting: *Deleted

## 2020-07-18 ENCOUNTER — Other Ambulatory Visit: Payer: Self-pay | Admitting: *Deleted

## 2020-07-18 DIAGNOSIS — Z952 Presence of prosthetic heart valve: Secondary | ICD-10-CM

## 2020-07-18 DIAGNOSIS — Z7901 Long term (current) use of anticoagulants: Secondary | ICD-10-CM | POA: Diagnosis not present

## 2020-07-18 DIAGNOSIS — I69359 Hemiplegia and hemiparesis following cerebral infarction affecting unspecified side: Secondary | ICD-10-CM | POA: Diagnosis not present

## 2020-07-18 DIAGNOSIS — Z5181 Encounter for therapeutic drug level monitoring: Secondary | ICD-10-CM | POA: Diagnosis not present

## 2020-07-18 LAB — POCT INR: INR: 1.8 — AB (ref 2.0–3.0)

## 2020-07-18 NOTE — Patient Outreach (Signed)
[facility]) Care Management [facility] CM Telephone Outreach, new referral Inpatient rehab completes Transition of Care outreach post- SNF discharge Post- SNF discharge day # 2 07/18/2020  [name] 1946/03/30 [id number]  Successful telephone outreach to Dortha Schwalbe, spouse/ caregiver/ on [place], for [employer]" Zwack, 74 y/o female referred to [facility] CM 07/16/20 by [facility] liaison after patient experienced recent hospitalization August 24-27, 2021 for vertigo/ dizziness and was diagnosed with subcortical CVA.  She was discharged from hospital to inpatient rehabilitation, and was subsequently discharged home to self-care on 07/16/20 with home health services in place.  Patient has history including, but not limited to previous CVA with hemiparesis; HTN/ HLD; MVR; COPD/ asthma; DM; and GERD.  HIPAA/ identity verified and purpose of call/ [facility] CM services discussed with patient's spouse/ caregiver who reports patient is currently on other phone talking with a friend, unavailable to speak with me herself today.  Spouse reports "everything going very well;" he denies current clinical/ medication concerns.  No concerns identified from screening.  Reports patient was able to fix a meal the day after she returned home with the crock pot; states her appetite is good.  Reports "somewhat weak" post- discharge, so currently using cane, which she does not use at baseline.  Spouse provides verbal consent for [facility] CM involvement in patient's care post- discharge, but states, "I think we will only need one more call," as he reports "things going very well."  He reports patient will be attending outpatient neuro PT; denies home health services ordered at time of discharge; states "not needed."  . Evaluation of current treatment plan related to post- CVA and patient's adherence to plan as established by provider. . Discussed plans with patient's spouse/ caregiver for ongoing care management  follow up and provided patient with direct contact information for care management team . Reviewed scheduled/upcoming provider appointments and confirmed aware of all with plans to attend all as scheduled; confirmed no transportation issues/ concerns . Confirmed that patient/ spouse have all contact information for care providers and understand to promptly notify for any new concerns/ problems . Discussed current clinical condition post- recent SNF discharge with spouse/ caregiver, and confirmed no current clinical concerns  Patient's spouse denies further issues, concerns, or problems today.  I provided/ confirmed that patient/ caregiver have my direct phone number, the main [place] office phone number, and the [facility] CM 24-hour nurse advice phone number should issues arise prior to next scheduled [facility] CM outreach.  Encouraged patient/ caregiver to contact me directly if needs, questions, issues, or concerns arise prior to next scheduled outreach; patient agreed to do so.  Plan:  Patient will take medications as prescribed and will attend all scheduled provider appointments  Patient will promptly notify care providers for any new concerns/ issues/ problems that arise  Patient will actively participate in outpatient neuro PT services as ordered post-inpatient rehabilitation discharge  I will make patient's PCP aware of [facility] RN CM involvement in patient's care-- will send barriers letter  [facility] CM outreach to continue with scheduled phone call in 2 weeks, unless indicated sooner  Oneta Rack, RN, BSN, [place] Care Management  [phone]

## 2020-07-18 NOTE — Patient Instructions (Signed)
Description   Spoke with patient and instructed to take 10mg  today and 7.5mg  tomorrow, then resume same dosage 5mg  daily except 7.5mg  on Mondays and Fridays. Does weekly checks due to insurance. Recheck on Monday.

## 2020-07-18 NOTE — Telephone Encounter (Signed)
Transitional Care call--Robert-spouse    1. Are you/is patient experiencing any problems since coming home? No Are there any questions regarding any aspect of care? No 2. Are there any questions regarding medications administration/dosing? No Are meds being taken as prescribed? Yes Patient should review meds with caller to confirm 3. Have there been any falls? No 4. Has Home Health been to the house and/or have they contacted you? Yes outpatient on Monday If not, have you tried to contact them? Can we help you contact them? 5. Are bowels and bladder emptying properly? Yes Are there any unexpected incontinence issues? No If applicable, is patient following bowel/bladder programs? 6. Any fevers, problems with breathing, unexpected pain? No 7. Are there any skin problems or new areas of breakdown? No 8. Has the patient/family member arranged specialty MD follow up (ie cardiology/neurology/renal/surgical/etc)?  None to schedule Can we help arrange? 9. Does the patient need any other services or support that we can help arrange? No 10. Are caregivers following through as expected in assisting the patient? Yes 11. Has the patient quit smoking, drinking alcohol, or using drugs as recommended? Yes  Appointment time 2:00 pm, arrive time 1:40 pm with Zella Ball then Dr. Letta Pate [address] [phone]

## 2020-07-21 ENCOUNTER — Ambulatory Visit (INDEPENDENT_AMBULATORY_CARE_PROVIDER_SITE_OTHER): Payer: Medicare Other

## 2020-07-21 ENCOUNTER — Other Ambulatory Visit: Payer: Self-pay

## 2020-07-21 ENCOUNTER — Encounter: Payer: Self-pay | Admitting: Physical Therapy

## 2020-07-21 ENCOUNTER — Ambulatory Visit: Payer: Medicare Other | Attending: Physician Assistant | Admitting: Physical Therapy

## 2020-07-21 DIAGNOSIS — Z952 Presence of prosthetic heart valve: Secondary | ICD-10-CM

## 2020-07-21 DIAGNOSIS — Z5181 Encounter for therapeutic drug level monitoring: Secondary | ICD-10-CM | POA: Diagnosis not present

## 2020-07-21 DIAGNOSIS — E7800 Pure hypercholesterolemia, unspecified: Secondary | ICD-10-CM | POA: Diagnosis not present

## 2020-07-21 DIAGNOSIS — R2681 Unsteadiness on feet: Secondary | ICD-10-CM | POA: Insufficient documentation

## 2020-07-21 DIAGNOSIS — G8194 Hemiplegia, unspecified affecting left nondominant side: Secondary | ICD-10-CM | POA: Diagnosis not present

## 2020-07-21 DIAGNOSIS — I69359 Hemiplegia and hemiparesis following cerebral infarction affecting unspecified side: Secondary | ICD-10-CM | POA: Diagnosis not present

## 2020-07-21 DIAGNOSIS — R278 Other lack of coordination: Secondary | ICD-10-CM | POA: Insufficient documentation

## 2020-07-21 DIAGNOSIS — I129 Hypertensive chronic kidney disease with stage 1 through stage 4 chronic kidney disease, or unspecified chronic kidney disease: Secondary | ICD-10-CM | POA: Diagnosis not present

## 2020-07-21 DIAGNOSIS — R29818 Other symptoms and signs involving the nervous system: Secondary | ICD-10-CM | POA: Insufficient documentation

## 2020-07-21 DIAGNOSIS — R2689 Other abnormalities of gait and mobility: Secondary | ICD-10-CM

## 2020-07-21 DIAGNOSIS — Z7901 Long term (current) use of anticoagulants: Secondary | ICD-10-CM

## 2020-07-21 DIAGNOSIS — M6281 Muscle weakness (generalized): Secondary | ICD-10-CM | POA: Diagnosis not present

## 2020-07-21 DIAGNOSIS — Z23 Encounter for immunization: Secondary | ICD-10-CM | POA: Diagnosis not present

## 2020-07-21 DIAGNOSIS — I679 Cerebrovascular disease, unspecified: Secondary | ICD-10-CM | POA: Diagnosis not present

## 2020-07-21 DIAGNOSIS — N182 Chronic kidney disease, stage 2 (mild): Secondary | ICD-10-CM | POA: Diagnosis not present

## 2020-07-21 DIAGNOSIS — R29898 Other symptoms and signs involving the musculoskeletal system: Secondary | ICD-10-CM | POA: Diagnosis not present

## 2020-07-21 DIAGNOSIS — R7301 Impaired fasting glucose: Secondary | ICD-10-CM | POA: Diagnosis not present

## 2020-07-21 LAB — POCT INR: INR: 2.7 (ref 2.0–3.0)

## 2020-07-21 NOTE — Patient Instructions (Signed)
Description   Spoke with patient and instructed to take 10mg  today, then resume same dosage 5mg  daily except 7.5mg  on Mondays and Fridays. Does weekly checks due to insurance. Recheck in 1 week.

## 2020-07-21 NOTE — Therapy (Signed)
[facility] [address] [place], [place], [address] Phone: [phone]   Fax:  [phone]  Physical Therapy Evaluation  Patient Details  Name: [name] MRN: [id number] Date of Birth: 04-Dec-1945 Referring Provider (PT): Marlowe Shores, PA-C (Dr. Percell Miller for follow-up)   Encounter Date: 07/21/2020   PT End of Session - 07/21/20 1504    Visit Number 1    Number of Visits 17    Date for PT Re-Evaluation 02/77/41   90 day cert for 8 wk POC   Authorization Type Medicare/AARP    PT Start Time 0845    PT Stop Time 0932    PT Time Calculation (min) 47 min    Equipment Utilized During Treatment Gait belt    Activity Tolerance Patient tolerated treatment well    Behavior During Therapy [facility] for tasks assessed/performed           Past Medical History:  Diagnosis Date  . Abnormality of gait 09/07/2016  . Allergy   . Diabetes mellitus without complication [facility])    Patient denies this - notes history of glucose intolerance  . GERD (gastroesophageal reflux disease)   . Hemiparesis and alteration of sensations as late effects of stroke ([place]) 09/07/2016  . History of pneumonia 1997  . Hypertension   . S/P MVR (mitral valve replacement)    Mechanical mitral valve replacement at age 44 (done in [place])  // echo 7/17: EF 55-60, normal wall motion, bileaflet mechanical mitral valve prosthesis functioning normally, mild LAE, mildly reduced RVSF, small pericardial effusion  . Stroke [facility]) 1997, 2013, 2015    Past Surgical History:  Procedure Laterality Date  . ABDOMINAL HYSTERECTOMY    . BRAIN SURGERY    . BUNIONECTOMY  1993  . CARDIAC VALVE REPLACEMENT  1997  . TOE SURGERY  1996    There were no vitals filed for this visit.    Subjective Assessment - 07/21/20 0848    Subjective Pt reports having more difficulty walking.  Had to use a walker when I left the hospital.  The walker seemed to be more of a  problem due to the L foot clunking into the walker and walker into the wall.  Use 4-prong cane at night and husband with gait belt for bathroom at night.  Seems the tone is more of a problem in the L arm.  No falls.  Prior to the most recent stroke, was independent in the house (no cane) and using cane more independently outdoors.    Patient is accompained by: Family member   Husband, [name]   Patient Stated Goals Pt's goals for therapy are to get back to where I was before-walking more independently with cane.    Currently in Pain? Yes    Pain Score 1     Pain Location Leg    Pain Orientation Left    Pain Descriptors / Indicators Aching    Pain Type Chronic pain    Pain Frequency Intermittent    Aggravating Factors  overly tired, standing too long    Pain Relieving Factors Ben-gay cream    Effect of Pain on Daily Activities PT will monitor, but not address as a goal at this time              [facility] PT Assessment - 07/21/20 0854      Assessment   Medical Diagnosis subcortical infarct    Referring Provider (PT) Marlowe Shores, PA-C   Dr. Percell Miller  for follow-up   Onset Date/Surgical Date 07/01/20    Hand Dominance Right    Prior Therapy d/c OPPT 02/2020      Precautions   Precautions Fall    Required Braces or Orthoses --   L AFO     Balance Screen   Has the patient fallen in the past 6 months No    Has the patient had a decrease in activity level because of a fear of falling?  No    Is the patient reluctant to leave their home because of a fear of falling?  No      Home Environment   Living Environment Private residence    Living Arrangements Spouse/significant other    Available Help at Discharge Family    Type of [place] entry    [place] Two level;Able to live on main level with bedroom/bathroom    [place] - single point;Cane - quad;Walker - 2 wheels    Additional Comments townhome with 12 steps but has everything  available on first floor, difficulty with stairs currently      Prior Function   Level of Independence Independent with household mobility without device;Independent with community mobility with device;Independent with basic ADLs    Leisure Enjoys walking with husband in neighborhood, day trips/shopping at antique stores      Observation/Other Assessments   Focus on Therapeutic Outcomes (FOTO)  FOTO intake score:  49.  Pt rates stairs and walking several blocks as limited alot.      Sensation   Light Touch Appears Intact      Posture/Postural Control   Posture/Postural Control Postural limitations    Posture Comments In sitting, demo posterior pelvic tilt      Tone   Assessment Location Left Lower Extremity      ROM / Strength   AROM / PROM / Strength Strength;PROM;AROM      AROM   Overall AROM  Deficits    Overall AROM Comments ankle dorsiflexion limited      PROM   Overall PROM  Deficits    Overall PROM Comments L ankle dorsiflexion -5 degrees      Strength   Overall Strength Deficits    Right Hip Flexion 4-/5    Left Hip Flexion 4/5    Right Knee Flexion 4+/5    Right Knee Extension 4+/5    Left Knee Flexion 4/5    Left Knee Extension 4/5    Right Ankle Dorsiflexion 4/5    Left Ankle Dorsiflexion 2/5    Left Ankle Plantar Flexion 2/5      Transfers   Transfers Sit to Stand;Stand to Sit    Sit to Stand 5: Supervision;4: Min guard;Without upper extremity assist;From chair/3-in-1    Five time sit to stand comments  19.97    Stand to Sit 5: Supervision;4: Min guard;Without upper extremity assist;To chair/3-in-1      Ambulation/Gait   Ambulation/Gait Yes    Ambulation/Gait Assistance 5: Supervision;4: Min guard    Ambulation Distance (Feet) 80 Feet   60 ft   Assistive device Straight cane    Gait Pattern Step-through pattern;Decreased arm swing - left;Decreased stance time - left;Decreased weight shift to left;Decreased dorsiflexion - left;Decreased trunk  rotation;Wide base of support;Poor foot clearance - left;Decreased hip/knee flexion - left;Decreased step length - right    Ambulation Surface Level;Indoor    Gait velocity 23.41 sec = 1.4 ft/sec  Standardized Balance Assessment   Standardized Balance Assessment Timed Up and Go Test;Berg Balance Test      Berg Balance Test   Sit to Stand Able to stand without using hands and stabilize independently    Standing Unsupported Able to stand safely 2 minutes    Sitting with Back Unsupported but Feet Supported on Floor or Stool Able to sit safely and securely 2 minutes    Stand to Sit Sits safely with minimal use of hands    Transfers Able to transfer safely, minor use of hands    Standing Unsupported with Eyes Closed Able to stand 10 seconds with supervision    Standing Unsupported with Feet Together Able to place feet together independently and stand for 1 minute with supervision    From Standing, Reach Forward with Outstretched Arm Can reach forward >12 cm safely (5")    From Standing Position, Pick up Object from Floor Able to pick up shoe, needs supervision    From Standing Position, Turn to Look Behind Over each Shoulder Needs supervision when turning    Turn 360 Degrees Able to turn 360 degrees safely but slowly    Standing Unsupported, Alternately Place Feet on Step/Stool Needs assistance to keep from falling or unable to try    Standing Unsupported, One Foot in Front Able to take small step independently and hold 30 seconds    Standing on One Leg Unable to try or needs assist to prevent fall    Total Score 37    Berg comment: Scores <45/56 indicates increased fall risk      Timed Up and Go Test   Normal TUG (seconds) 25.81    TUG Comments Scores >13.5 sec indicates increased fall risk.      LLE Tone   LLE Tone Mild      LLE Tone   LLE Tone Comments hypertonia noted, with spasticity noted in hamstrings and quadriceps resuting in impaired L LE coordination during mobility tasks  (specifically gait)                      Objective measurements completed on examination: See above findings.               PT Education - 07/21/20 1503    Education Details Eval results, POC    Person(s) Educated Patient;Spouse    Methods Explanation    Comprehension Verbalized understanding            PT Short Term Goals - 07/21/20 1512      PT SHORT TERM GOAL #1   Title Pt will be independent with HEP for improved strength, balance, transfers, and gait.  TARGET 08/22/2020    Time 4    Period Weeks    Status New      PT SHORT TERM GOAL #2   Title Pt will improve TUG score to less than or equal to 20 seconds for decreased fall risk.    Baseline 25.81 sec at eval    Time 4    Period Weeks    Status New      PT SHORT TERM GOAL #3   Title Pt will improve BERG to at least 42/56 for decreased fall risk.    Baseline 37/56 at eval    Time 4    Period Weeks    Status New      PT SHORT TERM GOAL #4   Title Pt will improve 5x sit<>stand to less than or equal  to 15 seconds for improved functional strength.    Baseline 19.97 sec at eval    Time 4    Period Weeks    Status New      PT SHORT TERM GOAL #5   Title Pt will ambulate at least 50 ft, indoor and household surfaces, no device, independently for improved household independence.    Time 4    Period Weeks    Status New             PT Long Term Goals - 07/21/20 2001      PT LONG TERM GOAL #1   Title Pt will be independent with progression of HEP for improved strength, balance, transfers, and gait.  TARGET 09/19/2020    Time 8    Period Weeks    Status New      PT LONG TERM GOAL #2   Title Pt will improve TUG score to less than or equal to 15 seconds for decreased fall risk.    Time 8    Period Weeks    Status New      PT LONG TERM GOAL #3   Title Pt will improve gait velocity to at least 2 ft/sec for improved gait efficiency and safety and decreased fall risk.    Baseline 1.4  ft/sec at eval    Time 8    Period Weeks    Status New      PT LONG TERM GOAL #4   Title Pt will ambulate at least 500 ft, indoors/outdoors, modified independently for improved community gait.    Time 8    Period Weeks    Status New      PT LONG TERM GOAL #5   Title Pt will improve FOTO score at discharge by at least 10% to demonstrate improved overall functional mobility.    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  Incidental finding of small L frontal lobe possible infarct; hospitalized 07/01/2020 and transferred to inpatient rehab, discharged home 07/16/2020.  Pt is known to the clinic from previous bout of therapy, discharging April 2021; today's functional measures indicate slowed gait speed, decreased functional strength, decreased balance (scores all decreased from previous bout of therapy).  Pt is at fall risk per gait velocity, Berg and TUG scores.  Prior to most recent CVA, pt was modified independent with cane for community distances and independent no device in home.  She would like to return to that level of independence.  She would benefit from skilled PT to address the above stated deficits for improved overall functional mobility and decreased fall risk.    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    Examination-Activity Limitations Locomotion Level;Transfers;Stairs;Stand    Examination-Participation Restrictions Community Activity;Other   travelling with husband   Stability/Clinical Decision Making Evolving/Moderate complexity    Clinical Decision Making  Moderate    Rehab Potential Good    PT Frequency 2x / week    PT Duration 8 weeks  plus eval   PT Treatment/Interventions ADLs/Self Care Home Management;DME Instruction;Gait training;Stair training;Functional mobility training;Therapeutic activities;Therapeutic exercise;Balance training;Neuromuscular re-education;Patient/family education;Passive range of motion;Orthotic Fit/Training    PT Next Visit Plan Review pt's previous HEP and see what is still appropriate/what needs to be updated; need to work on LLE weightbearing, NMR and strengthening, balance, timing and coordination of gait    Consulted and Agree with Plan of Care Patient;Family member/caregiver    Family Member Consulted husband           Patient will benefit from skilled therapeutic intervention in order to improve the following deficits and impairments:  Abnormal gait, Difficulty walking, Decreased range of motion, Impaired tone, Pain, Decreased balance, Decreased mobility, Decreased strength, Postural dysfunction (PT will monitor pain, but will likely not address as a goal at this time, as pain is longstanding)  Visit Diagnosis: Other abnormalities of gait and mobility  Unsteadiness on feet  Muscle weakness (generalized)     Problem List Patient Active Problem List   Diagnosis Date Noted  . Goals of care, counseling/discussion   . Palliative care by specialist   . Acute CVA (cerebrovascular accident) ([place]) 07/01/2020  . Subcortical infarction ([place]) 09/05/2019  . Anticoagulated on warfarin   . Left hemiparesis ([place])   . History of CVA with residual deficit   . Thrombocytopenia ([place])   . Prediabetes   . CVA (cerebral vascular accident) ([place]) 09/03/2019  . Vertigo 09/24/2017  . Benign paroxysmal positional vertigo   . History of stroke   . Hyperlipemia 08/23/2017  . Encounter for therapeutic drug monitoring 08/08/2017  . Long term (current) use of anticoagulants [Z79.01] 07/12/2017  . Hemiparesis and  alteration of sensations as late effects of stroke ([place]) 09/07/2016  . Abnormality of gait 09/07/2016  . HTN (hypertension) 04/13/2016  . H/O mitral valve replacement with mechanical valve 10/16/2015  . Diabetes mellitus without complication ([place])   . Hemiparesis (L sided - mild) due to old stroke ([place])     Gabrielle Daubert W. 07/21/2020, 8:05 PM  Frazier Butt., PT   [facility] [address] [place], [place], [address] Phone: [phone]   Fax:  [phone]  Name: [name] MRN: [id number] Date of Birth: [birth year]

## 2020-07-22 ENCOUNTER — Ambulatory Visit (INDEPENDENT_AMBULATORY_CARE_PROVIDER_SITE_OTHER): Payer: Medicare Other | Admitting: Cardiovascular Disease

## 2020-07-22 ENCOUNTER — Encounter: Payer: Self-pay | Admitting: Cardiovascular Disease

## 2020-07-22 ENCOUNTER — Other Ambulatory Visit: Payer: Self-pay

## 2020-07-22 VITALS — BP 100/64 | HR 90 | Ht 67.0 in | Wt 128.8 lb

## 2020-07-22 DIAGNOSIS — I10 Essential (primary) hypertension: Secondary | ICD-10-CM

## 2020-07-22 DIAGNOSIS — I639 Cerebral infarction, unspecified: Secondary | ICD-10-CM | POA: Diagnosis not present

## 2020-07-22 DIAGNOSIS — Z952 Presence of prosthetic heart valve: Secondary | ICD-10-CM

## 2020-07-22 DIAGNOSIS — I6300 Cerebral infarction due to thrombosis of unspecified precerebral artery: Secondary | ICD-10-CM | POA: Diagnosis not present

## 2020-07-22 NOTE — Progress Notes (Signed)
Cardiology Office Note   Date:  08/11/2020   ID:  [name], DOB Apr 28, 1946, MRN [id number]  PCP:  Haywood Pao, MD  Cardiologist:   Mertie Moores, MD   Chief Complaint  Patient presents with  . Mitral Regurgitation  . Hypertension   Problem List 1. Mitral valve replacement  - at age 74.  2. Essential HTN 3. Hyperlipidemia 4. DM.  5.  Multiple strokes   [name] is a 74 y.o. female who presents for management of her mitral valve replacement . Was seen with her husband [name].  Moved from [place], [place] recently  Has had 4 strokes and a TIA.  She does her own INR monitoring  - the strokes were not associated with marked INR variations Exercises some, not as much as when she lived in [place].   Does water aerobics.  Walks on occasion .  Also does physical therapy exercises.   April 13, 2016:  Doing well.  INRs have been OK. ( 3.2)   She measures her own.  Last week the INR levels were off when she was on Abx for a UTI .   Oct. 4, 2017:  Doing well.  S/p MVR 1997.   INR levels are ok , checks it at home and in our office  2. 9 last week   February 28, 2018: [name] is seen today for follow-up of her mitral valve replacement and hypertension.  She was last seen in our office in October, 2019 by Pecolia Ades,  NP . INR has been theraputuc because of her hx of strokes.  Goal INR is 3.0-3.5   April 09, 2020:   [name] is seen today for follow-up visit regarding her mitral valve replacement.  She was last seen in the office approximately 2 years ago. She had a stroke in October, 2020.Marland Kitchen  She has had strokes in the past.  Has occasional episodes of chest tightness.  Occurs spontaneously , not exertional  Some of the episodes tend to occur with mental stress.  She does 30 min of exercise in the am.  Does not have CP in the morning .   Sept. 14, 2021: [name] is seen today for follow up of her MV replacement, HTN She has had several strokes. Has had some chest  pain  Was in the hospital Aug. 24 - Sept 8 for another stroke.  Her INR goal is 3.0 - 3.5  Levels  Were slightly low at time .   Past Medical History:  Diagnosis Date  . Abnormality of gait 09/07/2016  . Allergy   . Diabetes mellitus without complication [facility])    Patient denies this - notes history of glucose intolerance  . GERD (gastroesophageal reflux disease)   . Hemiparesis and alteration of sensations as late effects of stroke ([place]) 09/07/2016  . History of pneumonia 1997  . Hypertension   . S/P MVR (mitral valve replacement)    Mechanical mitral valve replacement at age 66 (done in [place])  // echo 7/17: EF 55-60, normal wall motion, bileaflet mechanical mitral valve prosthesis functioning normally, mild LAE, mildly reduced RVSF, small pericardial effusion  . Stroke [facility]) 1997, 2013, 2015    Past Surgical History:  Procedure Laterality Date  . ABDOMINAL HYSTERECTOMY    . BRAIN SURGERY    . BUNIONECTOMY  1993  . CARDIAC VALVE REPLACEMENT  1997  . TOE SURGERY  1996     Current Outpatient Medications  Medication Sig Dispense Refill  .  acetaminophen (TYLENOL) 325 MG tablet Take 2 tablets (650 mg total) by mouth every 4 (four) hours as needed for mild pain (or temp > 37.5 C (99.5 F)).    Marland Kitchen amLODipine (NORVASC) 5 MG tablet Take 1 tablet (5 mg total) by mouth at bedtime. 30 tablet 0  . aspirin EC 81 MG tablet Take 81 mg by mouth daily.     Marland Kitchen atorvastatin (LIPITOR) 40 MG tablet Take 1 tablet (40 mg total) by mouth at bedtime. 30 tablet 2  . calcium carbonate 1250 MG capsule Take 1,200 mg by mouth at bedtime.     . cholecalciferol (VITAMIN D) 1000 UNITS tablet Take 2,000 Units daily by mouth.     . denosumab (PROLIA) 60 MG/ML SOSY injection Inject 60 mg into the skin every 6 (six) months.    . docusate sodium (COLACE) 100 MG capsule Take 100 mg at bedtime by mouth.     . Menthol, Topical Analgesic, (BENGAY EX) Apply 1 application topically daily. All painful areas    .  pantoprazole (PROTONIX) 40 MG tablet Take 1 tablet (40 mg total) by mouth daily. 30 tablet 1  . Propylene Glycol-Glycerin (SOOTHE) 0.6-0.6 % SOLN Place 1 drop into both eyes 2 (two) times daily as needed (dry eyes).    . warfarin (COUMADIN) 5 MG tablet Take 1-1.5 tablets (5-7.5 mg total) by mouth daily. Take 7.5mg  (one and a half tablets) on Monday and Friday; Take 5mg  (1 tablet) on all other days. OR AS DIRECTED PER COUMADIN CLINIC 45 tablet 0  . omeprazole (PRILOSEC) 20 MG capsule Take 20 mg by mouth daily.     No current facility-administered medications for this visit.    Allergies:   Zoloft [sertraline]    Social History:  The patient  reports that she has never smoked. She has never used smokeless tobacco. She reports that she does not drink alcohol and does not use drugs.   Family History:  The patient's family history includes Cancer in her mother; Heart disease in her mother; Hyperlipidemia in her mother; Hypertension in her father and mother; Stroke in her father.    ROS:   Noted in current history, otherwise review of systems is negative.   Physical Exam: Blood pressure 100/64, pulse 90, height 5\' 7"  (1.702 m), weight 128 lb 12.8 oz (58.4 kg), SpO2 96 %.  GEN:  Well nourished, well developed in no acute distress HEENT: Normal NECK: No JVD; No carotid bruits LYMPHATICS: No lymphadenopathy CARDIAC: RRR  , mechanical S1, normal S2, soft systolic murmur  RESPIRATORY:  Clear to auscultation without rales, wheezing or rhonchi  ABDOMEN: Soft, non-tender, non-distended MUSCULOSKELETAL:  No edema; No deformity  SKIN: Warm and dry NEUROLOGIC:  Alert and oriented x 3    EKG:      Recent Labs: 09/05/2019: Magnesium 1.9 07/07/2020: ALT 23; BUN 28; Creatinine, Ser 0.80; Potassium 4.0; Sodium 140 07/14/2020: Hemoglobin 12.8; Platelets 176    Lipid Panel    Component Value Date/Time   CHOL 148 07/02/2020 0500   TRIG 82 07/02/2020 0500   HDL 57 07/02/2020 0500   CHOLHDL 2.6  07/02/2020 0500   VLDL 16 07/02/2020 0500   LDLCALC 75 07/02/2020 0500      Wt Readings from Last 3 Encounters:  08/05/20 131 lb (59.4 kg)  07/29/20 130 lb 3.2 oz (59.1 kg)  07/22/20 128 lb 12.8 oz (58.4 kg)      Other studies Reviewed: Additional studies/ records that were reviewed today include:  Review  of the above records demonstrates:    ASSESSMENT AND PLAN:  1.   Mitral valve replacement:;  1997    - had another stroke.   unfornatually had another stroke . Her INR has been theraputic .   Cont current meds.     2. Hypertension:     BP is well controlled.   3. Frequent falls:  Advised use of cane / walker    4.  Chest pain:     Current medicines are reviewed at length with the patient today.  The patient does not have concerns regarding medicines.  The following changes have been made:  no change  Labs/ tests ordered today include:   No orders of the defined types were placed in this encounter.    Disposition:         Mertie Moores, MD  08/11/2020 4:11 PM    [facility] [place], [place], [place]  [address] Phone: [phone]; Fax: [phone]

## 2020-07-22 NOTE — Patient Instructions (Addendum)
Medication Instructions:  Your physician recommends that you continue on your current medications as directed. Please refer to the Current Medication list given to you today.  *If you need a refill on your cardiac medications before your next appointment, please call your pharmacy*   Lab Work: None Ordered If you have labs (blood work) drawn today and your tests are completely normal, you will receive your results only by: MyChart Message (if you have MyChart) OR A paper copy in the mail If you have any lab test that is abnormal or we need to change your treatment, we will call you to review the results.   Testing/Procedures: None Ordered   Follow-Up: At [facility], you and your health needs are our priority.  As part of our continuing mission to provide you with exceptional heart care, we have created designated Provider Care Teams.  These Care Teams include your primary Cardiologist (physician) and Advanced Practice Providers (APPs -  Physician Assistants and Nurse Practitioners) who all work together to provide you with the care you need, when you need it.   Your next appointment:   6 month(s)  The format for your next appointment:   In Person  Provider:   You may see Philip Nahser, MD or one of the following Advanced Practice Providers on your designated Care Team:   Scott Weaver, PA-C Vin Bhagat, PA-C   

## 2020-07-28 ENCOUNTER — Ambulatory Visit (INDEPENDENT_AMBULATORY_CARE_PROVIDER_SITE_OTHER): Payer: Medicare Other | Admitting: Internal Medicine

## 2020-07-28 DIAGNOSIS — Z5181 Encounter for therapeutic drug level monitoring: Secondary | ICD-10-CM

## 2020-07-28 LAB — POCT INR: INR: 3 (ref 2.0–3.0)

## 2020-07-28 NOTE — Patient Instructions (Signed)
Description   Spoke with patient and instructed her to continue same dosage 5mg  daily except 7.5mg  on Mondays and Fridays. Does weekly checks due to insurance. Recheck in 1 week.

## 2020-07-29 ENCOUNTER — Encounter: Payer: Self-pay | Admitting: Registered Nurse

## 2020-07-29 ENCOUNTER — Encounter: Payer: Medicare Other | Attending: Registered Nurse | Admitting: Registered Nurse

## 2020-07-29 ENCOUNTER — Ambulatory Visit: Payer: Medicare Other

## 2020-07-29 ENCOUNTER — Other Ambulatory Visit: Payer: Self-pay

## 2020-07-29 VITALS — BP 112/63 | HR 92 | Temp 98.2°F | Ht 67.0 in | Wt 130.2 lb

## 2020-07-29 DIAGNOSIS — R29818 Other symptoms and signs involving the nervous system: Secondary | ICD-10-CM | POA: Diagnosis not present

## 2020-07-29 DIAGNOSIS — I6930 Unspecified sequelae of cerebral infarction: Secondary | ICD-10-CM

## 2020-07-29 DIAGNOSIS — R278 Other lack of coordination: Secondary | ICD-10-CM | POA: Diagnosis not present

## 2020-07-29 DIAGNOSIS — R2681 Unsteadiness on feet: Secondary | ICD-10-CM

## 2020-07-29 DIAGNOSIS — E7849 Other hyperlipidemia: Secondary | ICD-10-CM | POA: Diagnosis not present

## 2020-07-29 DIAGNOSIS — R29898 Other symptoms and signs involving the musculoskeletal system: Secondary | ICD-10-CM | POA: Diagnosis not present

## 2020-07-29 DIAGNOSIS — M6281 Muscle weakness (generalized): Secondary | ICD-10-CM | POA: Diagnosis not present

## 2020-07-29 DIAGNOSIS — R2689 Other abnormalities of gait and mobility: Secondary | ICD-10-CM | POA: Diagnosis not present

## 2020-07-29 DIAGNOSIS — I639 Cerebral infarction, unspecified: Secondary | ICD-10-CM

## 2020-07-29 DIAGNOSIS — I10 Essential (primary) hypertension: Secondary | ICD-10-CM | POA: Diagnosis not present

## 2020-07-29 NOTE — Patient Instructions (Signed)
Access Code: [email or web address] URL: [email or web address] Date: 07/29/2020 Prepared by: Cherly Anderson  Exercises Backward Walking with Counter Support - 1 x daily - 5 x weekly - 3-5 reps - 1 sets Side Stepping with Counter Support - 1 x daily - 5 x weekly - 3-5 reps - 1 sets Standing March with Unilateral Counter Support - 2 x daily - 6 x weekly - 10 reps - 3 sets Forward Step Up - 1 x daily - 5 x weekly - 10 reps - 1 sets Standing Balance with Eyes Closed - 1 x daily - 5 x weekly - 3 reps - 1 sets - 20 sec hold Sit to Stand without Arm Support - 2 x daily - 6 x weekly - 10 reps - 1-2 sets Standing Horizontal Saccades - 1 x daily - 5 x weekly - 10 reps - 1 sets Seated Vertical Saccades - 1 x daily - 5 x weekly - 10 reps - 1 sets Seated Gaze Stabilization with Head Rotation - 1 x daily - 5 x weekly - 3 reps - 1 sets - 20 hold Seated Gaze Stabilization with Head Nod - 1 x daily - 5 x weekly - 3 reps - 1 sets - 20 hold Bridge with Hip Abduction and Resistance - 1 x daily - 5 x weekly - 10 reps - 2 sets

## 2020-07-29 NOTE — Progress Notes (Signed)
Subjective:    Patient ID: [name], female    DOB: Sep 13, 1946, 74 y.o.   MRN: [id number]  HPI: [name] is a 74 y.o. female who is here for Transitional Care visit for follow up of her Subcortical Infarction, History of CVA with Residual Deficit, Essential Hypertension and Hyperlipidemia. She presented to [facility] on 07/01/2020 with three days of dizziness and weakness. Neurology consulted.  MRI Brain WO Contrast:  IMPRESSION: 1. Single small curvilinear cortical to subcortical ischemic nonhemorrhagic infarct involving the parasagittal posterior left frontal lobe. 2. No other acute intracranial abnormality. 3. Chronic right parietal encephalomalacia and gliosis with associated chronic hemosiderin staining and sequelae of overlying right parietal craniotomy, stable. 4. Underlying moderate to advanced age-related cerebral atrophy with chronic microvascular ischemic disease, with multiple remote lacunar infarcts involving the left thalamus and bilateral cerebellar hemispheres.  CT Angio Head W or WO Contrast:  IMPRESSION: 1. No emergent finding. 2. Mild atherosclerosis without flow limiting stenosis or embolic source identified in the head and neck. 3. Intracranial findings as described on preceding brain MRI.  [name] was admitted to Inpatient Rehabilitation on 07/04/2020 and discharged home on 07/16/2020. She is receiving Outpatient Therapy at [facility]. She denies any pain at this time. On her health and History she rated her pain 1. Also reports she has a good appetite.   Husband in room.   Pain Inventory Average Pain 3 Pain Right Now 1 My pain is dull and stabbing  LOCATION OF PAIN  - Lower back & left thigh BOWEL Number of stools per week: 6-7 Oral laxative use Yes  Type of laxative - Colace Enema or suppository use No  History of colostomy No  Incontinent No   BLADDER Normal  In and out cath, frequency - None Able to self  cath N/A Bladder incontinence No  Frequent urination No  Leakage with coughing No  Difficulty starting stream No  Incomplete bladder emptying No    Mobility use a cane how many minutes can you walk? 15 MINS ability to climb steps?  yes do you drive?  no  Function retired  Neuro/Psych weakness trouble walking  Prior Studies New Patient / TC  Physicians involved in your care New Patient / TC   Family History  Problem Relation Age of Onset  . Cancer Mother        Bone  . Heart disease Mother   . Hyperlipidemia Mother   . Hypertension Mother   . Stroke Father   . Hypertension Father   . Heart attack Neg Hx    Social History   Socioeconomic History  . Marital status: Married    Spouse name: Not on file  . Number of children: 2  . 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 Highest education level: Not on file  Occupational History  . Occupation: Retired  Tobacco Use  . Smoking status: Never Smoker  . Smokeless tobacco: Never Used  Vaping Use  . Vaping Use: Never used  Substance and Sexual Activity  . Alcohol use: No    Alcohol/week: 0.0 standard drinks  . Drug use: No  . Sexual activity: Not on file    Comment: Married  Other Topics Concern  . Not on file  Social History Narrative   Lives at home w/ her husband   Right-handed   Caffeine: none   Social Determinants of Health   Financial Resource Strain:   . Difficulty of Paying Living Expenses:  Not on file  Food Insecurity: No Food Insecurity  . Worried About Charity fundraiser in the Last Year: Never true  . Ran Out of Food in the Last Year: Never true  Transportation Needs: No Transportation Needs  . Lack of Transportation (Medical): No  . Lack of Transportation (Non-Medical): No  Physical Activity:   . Days of Exercise per Week: Not on file  . Minutes of Exercise per Session: Not on file  Stress:   . Feeling of Stress : Not on file  Social Connections:   . Frequency of Communication with  Friends and Family: Not on file  . Frequency of Social Gatherings with Friends and Family: Not on file  . Attends Religious Services: Not on file  . Active Member of Clubs or Organizations: Not on file  . Attends Archivist Meetings: Not on file  . Marital Status: Not on file   Past Surgical History:  Procedure Laterality Date  . ABDOMINAL HYSTERECTOMY    . BRAIN SURGERY    . BUNIONECTOMY  1993  . CARDIAC VALVE REPLACEMENT  1997  . TOE SURGERY  1996   Past Medical History:  Diagnosis Date  . Abnormality of gait 09/07/2016  . Allergy   . Diabetes mellitus without complication [facility])    Patient denies this - notes history of glucose intolerance  . GERD (gastroesophageal reflux disease)   . Hemiparesis and alteration of sensations as late effects of stroke ([place]) 09/07/2016  . History of pneumonia 1997  . Hypertension   . S/P MVR (mitral valve replacement)    Mechanical mitral valve replacement at age 40 (done in [place])  // echo 7/17: EF 55-60, normal wall motion, bileaflet mechanical mitral valve prosthesis functioning normally, mild LAE, mildly reduced RVSF, small pericardial effusion  . Stroke ([place]) 1997, 2013, 2015   BP 112/63   Pulse 92   Temp 98.2 F (36.8 C)   Ht 5\' 7"  (1.702 m)   Wt 130 lb 3.2 oz (59.1 kg)   SpO2 97%   BMI 20.39 kg/m   Opioid Risk Score:   Fall Risk Score:  `1  Depression screen PHQ 2/9  Depression screen [facility] 2/9 07/29/2020 07/18/2020 10/22/2019 09/25/2019  Decreased Interest 0 0 0 0  Down, Depressed, Hopeless 0 0 0 0  PHQ - 2 Score 0 0 0 0  Altered sleeping 1 - - -  Tired, decreased energy 0 - - -  Change in appetite 0 - - -  Feeling bad or failure about yourself  0 - - -  Trouble concentrating 0 - - -  Moving slowly or fidgety/restless 0 - - -  Suicidal thoughts 0 - - -  PHQ-9 Score 1 - - -  Some recent data might be hidden   Review of Systems  Constitutional: Negative.   HENT: Negative.   Eyes: Negative.   Respiratory:  Negative.   Cardiovascular: Negative.   Gastrointestinal: Negative.   Endocrine: Negative.   Genitourinary: Negative.   Musculoskeletal: Positive for gait problem.       OFF BALANCE  Skin: Negative.   Allergic/Immunologic: Negative.   Neurological: Positive for tremors, weakness and light-headedness.       LEFT SIDE WEAKNESS  Psychiatric/Behavioral: Negative.   All other systems reviewed and are negative.      Objective:   Physical Exam Vitals and nursing note reviewed.  Constitutional:      Appearance: Normal appearance.  Cardiovascular:     Rate and Rhythm:  Normal rate and regular rhythm.     Pulses: Normal pulses.     Heart sounds: Normal heart sounds.  Pulmonary:     Effort: Pulmonary effort is normal.     Breath sounds: Normal breath sounds.  Musculoskeletal:     Cervical back: Normal range of motion and neck supple.     Comments: Normal Muscle Bulk and Muscle Testing Reveals:  Upper Extremities: Full ROM and Muscle Strength  On the Right 5/5 and Left 4/5 Lower Extremities:  Full ROM and Muscle Strength on the Right  5/5 and Left 4/5 Wearing Left AFO Arises from Table slowly using walker for support Narrow Based   Skin:    General: Skin is warm and dry.  Neurological:     Mental Status: She is alert and oriented to person, place, and time.  Psychiatric:        Mood and Affect: Mood normal.        Behavior: Behavior normal.           Assessment & Plan:  1.Subcortical Infarction, History of CVA with Residual Deficit: She has an appointment with Neurology. Continue Outpatient Therapy with Neuro-Rehabilitation 2. Essential Hypertension: Continue current medication regimen. Continue to Monitor. PCP Following 3. Hyperlipidemia: Continue current medication regimen. PCP Following.   20 minutes of face to face patient care time was spent during this visit. All questions were encouraged and answered.  F/U with  Dr Letta Pate in 4- 6 weeks

## 2020-07-29 NOTE — Therapy (Signed)
[name] [address] [place], [place], [address] Phone: [phone]   Fax:  [phone]  Physical Therapy Treatment  Patient Details  Name: [name] MRN: [id number] Date of Birth: 1946-03-13 Referring Provider (PT): Marlowe Shores, PA-C (Dr. Percell Miller for follow-up)   Encounter Date: 07/29/2020   PT End of Session - 07/29/20 0854    Visit Number 2    Number of Visits 17    Date for PT Re-Evaluation 93/23/55   90 day cert for 8 wk POC   Authorization Type Medicare/AARP    PT Start Time 0846    PT Stop Time 0935    PT Time Calculation (min) 49 min    Equipment Utilized During Treatment Gait belt    Activity Tolerance Patient tolerated treatment well    Behavior During Therapy [facility] for tasks assessed/performed           Past Medical History:  Diagnosis Date  . Abnormality of gait 09/07/2016  . Allergy   . Diabetes mellitus without complication [facility])    Patient denies this - notes history of glucose intolerance  . GERD (gastroesophageal reflux disease)   . 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 History of pneumonia 1997  . Hypertension   . S/P MVR (mitral valve replacement)    Mechanical mitral valve replacement at age 52 (done in [place])  // echo 7/17: EF 55-60, normal wall motion, bileaflet mechanical mitral valve prosthesis functioning normally, mild LAE, mildly reduced RVSF, small pericardial effusion  . Stroke [facility]) 1997, 2013, 2015    Past Surgical History:  Procedure Laterality Date  . ABDOMINAL HYSTERECTOMY    . BRAIN SURGERY    . BUNIONECTOMY  1993  . CARDIAC VALVE REPLACEMENT  1997  . TOE SURGERY  1996    There were no vitals filed for this visit.   Subjective Assessment - 07/29/20 0852    Subjective Pt reports that she is not taking the tylenol currently as was told it could be interfering with the INR. Discussed checking  with her MD about that. She finds pain in low back and left thigh worse in morning and end of day. Continues to use bengay.    Patient is accompained by: Family member   Husband, [name]   Patient Stated Goals Pt's goals for therapy are to get back to where I was before-walking more independently with cane.    Currently in Pain? Yes    Pain Score 3     Pain Location Leg    Pain Orientation Left    Pain Descriptors / Indicators Aching    Pain Type Chronic pain    Pain Onset More than a month ago    Pain Frequency Intermittent    Pain Relieving Factors Bengay                             [facility] Adult PT Treatment/Exercise - 07/29/20 0854      Transfers   Transfers Sit to Stand;Stand to Sit    Sit to Stand 5: Supervision    Sit to Stand Details Tactile cues for weight shifting    Sit to Stand Details (indicate cue type and reason) verbal cues to try to keep left hip in more neutral position and lean forward to rise.    Stand to Sit 5: Supervision      Ambulation/Gait  Ambulation/Gait Yes    Ambulation/Gait Assistance 5: Supervision    Ambulation/Gait Assistance Details in clinic for activities and in/out of clinic    Ambulation Distance (Feet) 75 Feet    Assistive device Straight cane   left AFO   Gait Pattern Step-through pattern;Decreased hip/knee flexion - left;Decreased arm swing - left;Decreased stance time - left    Ambulation Surface Level;Indoor      Neuro Re-ed    Neuro Re-ed Details  Reviewed prior HEP from last episode to start to see what may need to be updated: Sit to stand 5 x 2 with verbal and tactile cues to keep left hip in more neutral position. At counter- side stepping 6' x 6, backwards gait and then marching forward 6' x 6 with verbal cues to tighten stance leg bottom to help support. Pt reported some pain in left hip with marching and backwards gait. Decreased after sitting to rest back to baseline. Alternating toe taps on bottom cabinet x 10 with 1  UE support. Standing in front of mat with feet apart eyes closed x 30 sec supervision, staggered stance x 30 sec each position CGA with increased sway noted. Removed tandem stance from prior HEP for now until we work further. Hooklying bridges with manual resistance to facilitate more hip abduction when rising x 10. Seated verbally reviewed and demonstrated vertical and horizontal saccades as well as gaze stabilization. Pt discussed that she was looking at target about10' away. Advised to try to have target about arms length away for activities.                  PT Education - 07/29/20 0946    Education Details Reviewed HEP and updated from prior episode.    Person(s) Educated Patient;Spouse    Methods Explanation;Demonstration;Handout    Comprehension Verbalized understanding;Returned demonstration            PT Short Term Goals - 07/21/20 1512      PT SHORT TERM GOAL #1   Title Pt will be independent with HEP for improved strength, balance, transfers, and gait.  TARGET 08/22/2020    Time 4    Period Weeks    Status New      PT SHORT TERM GOAL #2   Title Pt will improve TUG score to less than or equal to 20 seconds for decreased fall risk.    Baseline 25.81 sec at eval    Time 4    Period Weeks    Status New      PT SHORT TERM GOAL #3   Title Pt will improve BERG to at least 42/56 for decreased fall risk.    Baseline 37/56 at eval    Time 4    Period Weeks    Status New      PT SHORT TERM GOAL #4   Title Pt will improve 5x sit<>stand to less than or equal to 15 seconds for improved functional strength.    Baseline 19.97 sec at eval    Time 4    Period Weeks    Status New      PT SHORT TERM GOAL #5   Title Pt will ambulate at least 50 ft, indoor and household surfaces, no device, independently for improved household independence.    Time 4    Period Weeks    Status New             PT Long Term Goals - 07/21/20 2001  PT LONG TERM GOAL #1   Title Pt  will be independent with progression of HEP for improved strength, balance, transfers, and gait.  TARGET 09/19/2020    Time 8    Period Weeks    Status New      PT LONG TERM GOAL #2   Title Pt will improve TUG score to less than or equal to 15 seconds for decreased fall risk.    Time 8    Period Weeks    Status New      PT LONG TERM GOAL #3   Title Pt will improve gait velocity to at least 2 ft/sec for improved gait efficiency and safety and decreased fall risk.    Baseline 1.4 ft/sec at eval    Time 8    Period Weeks    Status New      PT LONG TERM GOAL #4   Title Pt will ambulate at least 500 ft, indoors/outdoors, modified independently for improved community gait.    Time 8    Period Weeks    Status New      PT LONG TERM GOAL #5   Title Pt will improve FOTO score at discharge by at least 10% to demonstrate improved overall functional mobility.    Time 8    Period Weeks    Status New                 Plan - 07/29/20 0946    Clinical Impression Statement PT focused on reviewing/updated prior HEP from old episode to determine what was still appropriate and safe. Removed tandem stance at this time as pt less stable. Will perform toe taps versus step up to begin as well. Pt was cued to try to keep left hip in more neutral position as pain seems worse when IR more.    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 need to work on LLE weightbearing, try step-ups, NMR and strengthening, balance, timing and coordination of gait    Consulted and Agree with Plan of Care Patient;Family member/caregiver    Family Member Consulted husband           Patient will benefit from skilled therapeutic intervention in order to improve the following deficits and impairments:  Abnormal gait, Difficulty walking, Decreased range of motion, Impaired tone, Pain, Decreased balance, Decreased mobility, Decreased strength, Postural dysfunction  Visit Diagnosis: Other abnormalities of gait and mobility  Muscle weakness (generalized)  Unsteadiness on feet     Problem List Patient Active Problem List   Diagnosis Date Noted  . Goals of care, counseling/discussion   . Palliative care by specialist   . Acute CVA (cerebrovascular accident) ([place]) 07/01/2020  . Subcortical infarction ([place]) 09/05/2019  . Anticoagulated on warfarin   . Left hemiparesis ([place])   . History of CVA with residual deficit   . Thrombocytopenia ([place])   . Prediabetes   . CVA (cerebral vascular accident) ([place]) 09/03/2019  . Vertigo 09/24/2017  . Benign paroxysmal positional vertigo   .  History of stroke   . Hyperlipemia 08/23/2017  . Encounter for therapeutic drug monitoring 08/08/2017  . Long term (current) use of anticoagulants [Z79.01] 07/12/2017  . Hemiparesis and alteration of sensations as late effects of stroke ([place]) 09/07/2016  . Abnormality of gait 09/07/2016  . HTN (hypertension) 04/13/2016  . H/O mitral valve replacement with mechanical valve 10/16/2015  . Diabetes mellitus without complication ([place])   . Hemiparesis (L sided - mild) due to old stroke ([place])     Electa Sniff, PT, DPT, NCS 07/29/2020, 9:49 AM  [facility] [address] [place], [place], [address] Phone: [phone]   Fax:   [phone]  Name: [name] MRN: [id number] Date of Birth: [birth year]

## 2020-07-30 ENCOUNTER — Ambulatory Visit: Payer: Self-pay | Admitting: *Deleted

## 2020-07-31 ENCOUNTER — Other Ambulatory Visit: Payer: Self-pay | Admitting: *Deleted

## 2020-07-31 ENCOUNTER — Encounter: Payer: Self-pay | Admitting: *Deleted

## 2020-07-31 DIAGNOSIS — R350 Frequency of micturition: Secondary | ICD-10-CM | POA: Diagnosis not present

## 2020-07-31 DIAGNOSIS — R3915 Urgency of urination: Secondary | ICD-10-CM | POA: Diagnosis not present

## 2020-07-31 NOTE — Patient Outreach (Signed)
[facility]) Care Management [facility] CM Telephone Outreach Transition of care follow up completed by [facility] Physical medicine and Rehab on 07/18/20 Post- SNF discharge day # 15  07/31/2020  FAREEDAH MAHLER 1946/02/01 [id number]  Successful telephone outreach to Dortha Schwalbe, spouse/ caregiver/ on [place], for [employer]" Bibb, 74 y/o female referred to [facility] CM 07/16/20 by [facility] liaison after patient experienced recent hospitalization August 24-27, 2021 for vertigo/ dizziness and was diagnosed with subcortical CVA.  She was discharged from hospital to inpatient rehabilitation, and was subsequently discharged home to self-care on 07/16/20 with home health services in place.  Patient has history including, but not limited to previous CVA with hemiparesis; HTN/ HLD; MVR; COPD/ asthma; DM; and GERD.  HIPAA/ identity verified.  Spouse reports patient "doing great and making good steady progress;" he denies clinical concerns and tells me they are both getting haircuts today.  Patient unavailable to speak with me today.  -- discussed current clinical condition with caregiver and confirmed no current clinical concerns --  medication review completed with caregiver; no concerns identified; patient continues self-managing independently; Patient was recently discharged from SNF and all medications were reviewed with caregiver/ spouse today; no concerns identified.  Caregiver verbalizes a good general understanding of the purpose/ dosing/ scheduling of all medications. -- discussed outpatient rehabilitation visits for PT/ OT: confirmed patient tolerating well -- reviewed with caregiver recent and upcoming provider appointments; confirmed patient has plans to attend all as scheduled -- discussed timing of corona virus vaccine booster around recently obtained flu vaccine -- [facility] CM initial assessment completed and sent via secure messaging through EHR to PCP  Patient's spouse denies  further issues, concerns, or problems today. I provided/ confirmed that patient/ caregiver havemy direct phone number, the main [facility] CM office phone number, and the [facility] CM 24-hour nurse advice phone number should issues arise prior to next scheduled [facility] CM outreach by phone next month.  Husband/ caregiver continues to report that he does not believe that patient will have ongoing care coordination needs, but agrees to at least one more call.  Encouraged patient/ caregiver to contact me directly if needs, questions, issues, or concerns arise prior to next scheduled outreach; patient agreed to do so.  Plan:  Patient will take medications as prescribed and will attend all scheduled provider appointments  Patient will promptly notify care providers for any new concerns/ issues/ problems that arise  Patient will actively participate in outpatient neuro PT/ OT services as ordered post-inpatient rehabilitation discharge  I will share today's [facility] CM notes/ care plan goals with patient's PCP as[facility] RN CM initial assessment  [facility] CM outreach to continue with scheduled phone call next month, unless indicated sooner  Oneta Rack, RN, BSN, [employer] Coordinator [facility] Care Management  [phone]

## 2020-08-01 ENCOUNTER — Other Ambulatory Visit: Payer: Self-pay

## 2020-08-01 ENCOUNTER — Ambulatory Visit: Payer: Medicare Other

## 2020-08-01 DIAGNOSIS — R2689 Other abnormalities of gait and mobility: Secondary | ICD-10-CM

## 2020-08-01 DIAGNOSIS — R2681 Unsteadiness on feet: Secondary | ICD-10-CM | POA: Diagnosis not present

## 2020-08-01 DIAGNOSIS — R278 Other lack of coordination: Secondary | ICD-10-CM | POA: Diagnosis not present

## 2020-08-01 DIAGNOSIS — J029 Acute pharyngitis, unspecified: Secondary | ICD-10-CM | POA: Diagnosis not present

## 2020-08-01 DIAGNOSIS — R29898 Other symptoms and signs involving the musculoskeletal system: Secondary | ICD-10-CM | POA: Diagnosis not present

## 2020-08-01 DIAGNOSIS — M6281 Muscle weakness (generalized): Secondary | ICD-10-CM | POA: Diagnosis not present

## 2020-08-01 DIAGNOSIS — R29818 Other symptoms and signs involving the nervous system: Secondary | ICD-10-CM | POA: Diagnosis not present

## 2020-08-01 NOTE — Therapy (Signed)
[facility] [address] [place], [place], [address] Phone: [phone]   Fax:  [phone]  Physical Therapy Treatment  Patient Details  Name: [name] MRN: [id number] Date of Birth: 08-Apr-1946 Referring Provider (PT): Marlowe Shores, PA-C (Dr. Percell Miller for follow-up)   Encounter Date: 08/01/2020   PT End of Session - 08/01/20 1316    Visit Number 3    Number of Visits 17    Date for PT Re-Evaluation 95/63/87   90 day cert for 8 wk POC   Authorization Type Medicare/AARP    PT Start Time 1315    PT Stop Time 5643    PT Time Calculation (min) 43 min    Equipment Utilized During Treatment Gait belt    Activity Tolerance Patient tolerated treatment well    Behavior During Therapy [facility] for tasks assessed/performed           Past Medical History:  Diagnosis Date   Abnormality of gait 09/07/2016   Allergy    Diabetes mellitus without complication [facility])    Patient denies this - notes history of glucose intolerance   GERD (gastroesophageal reflux disease)    Hemiparesis and alteration of sensations as late effects of stroke ([place]) 09/07/2016   History of pneumonia 1997   Hypertension    S/P MVR (mitral valve replacement)    Mechanical mitral valve replacement at age 74 (done in [place])  // echo 7/17: EF 55-60, normal wall motion, bileaflet mechanical mitral valve prosthesis functioning normally, mild LAE, mildly reduced RVSF, small pericardial effusion   Stroke ([place]) 1997, 2013, 2015    Past Surgical History:  Procedure Laterality Date   [place]    There were no vitals filed for this visit.   Subjective Assessment - 08/01/20 1317    Subjective Pt reports that hip is feeling better today. She has started back with her tylenol and continues to use her BenGay.    Patient  is accompained by: Family member   Husband, [name]   Patient Stated Goals Pt's goals for therapy are to get back to where I was before-walking more independently with cane.    Currently in Pain? Yes    Pain Location --   gnawing   Pain Orientation Left    Pain Descriptors / Indicators Aching    Pain Type Chronic pain    Pain Onset More than a month ago    Pain Frequency Intermittent                             [facility] Adult PT Treatment/Exercise - 08/01/20 1319      Transfers   Transfers Sit to Stand;Stand to Sit    Sit to Stand 5: Supervision    Stand to Sit 5: Supervision      Ambulation/Gait   Ambulation/Gait Yes    Ambulation/Gait Assistance 5: Supervision    Ambulation/Gait Assistance Details PT provided facilitation at pelvis to weight shift to the left and for anterior pelvic rotation. Pt ambulated outside during second bout on sidewalk and grass. Verbal cues to increase left foot clearance over grass. Pt slowed and was less steady in grass.    Ambulation Distance (Feet) 230 Feet   850'   Assistive device Straight cane    Gait  Pattern Step-through pattern;Decreased hip/knee flexion - left;Decreased stance time - left    Ambulation Surface Level;Indoor      Neuro Re-ed    Neuro Re-ed Details  Gait over blue mat then reciprocal steps over 4 yard sticks x  6 bouts. Side stepping over 4 yard sticks x 6 bouts with SPC. CGA for safety. Pt had more difficulty with going toward right with adducting left leg. Cued to stay up tall on right leg. Sit to stand from mat with airex under feet and red theraband around legs without hands x 5 with CGA/min assist and verbal cues to lean forward more.                    PT Short Term Goals - 07/21/20 1512      PT SHORT TERM GOAL #1   Title Pt will be independent with HEP for improved strength, balance, transfers, and gait.  TARGET 08/22/2020    Time 4    Period Weeks    Status New      PT SHORT TERM GOAL #2   Title  Pt will improve TUG score to less than or equal to 20 seconds for decreased fall risk.    Baseline 25.81 sec at eval    Time 4    Period Weeks    Status New      PT SHORT TERM GOAL #3   Title Pt will improve BERG to at least 42/56 for decreased fall risk.    Baseline 37/56 at eval    Time 4    Period Weeks    Status New      PT SHORT TERM GOAL #4   Title Pt will improve 5x sit<>stand to less than or equal to 15 seconds for improved functional strength.    Baseline 19.97 sec at eval    Time 4    Period Weeks    Status New      PT SHORT TERM GOAL #5   Title Pt will ambulate at least 50 ft, indoor and household surfaces, no device, independently for improved household independence.    Time 4    Period Weeks    Status New             PT Long Term Goals - 07/21/20 2001      PT LONG TERM GOAL #1   Title Pt will be independent with progression of HEP for improved strength, balance, transfers, and gait.  TARGET 09/19/2020    Time 8    Period Weeks    Status New      PT LONG TERM GOAL #2   Title Pt will improve TUG score to less than or equal to 15 seconds for decreased fall risk.    Time 8    Period Weeks    Status New      PT LONG TERM GOAL #3   Title Pt will improve gait velocity to at least 2 ft/sec for improved gait efficiency and safety and decreased fall risk.    Baseline 1.4 ft/sec at eval    Time 8    Period Weeks    Status New      PT LONG TERM GOAL #4   Title Pt will ambulate at least 500 ft, indoors/outdoors, modified independently for improved community gait.    Time 8    Period Weeks    Status New      PT LONG TERM GOAL #5   Title Pt  will improve FOTO score at discharge by at least 10% to demonstrate improved overall functional mobility.    Time 8    Period Weeks    Status New                 Plan - 08/01/20 1550    Clinical Impression Statement Pt able to increase gait distance outside today. Was challenged with walking in grass. Also  noted when walking over mat she did not pick up left foot as much scuffing foot at times.    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    Examination-Activity Limitations Locomotion Level;Transfers;Stairs;Stand    Examination-Participation Restrictions Community Activity;Other    Rehab Potential Good    PT Frequency 2x / week    PT Duration 8 weeks    PT Treatment/Interventions ADLs/Self Care Home Management;DME Instruction;Gait training;Stair training;Functional mobility training;Therapeutic activities;Therapeutic exercise;Balance training;Neuromuscular re-education;Patient/family education;Passive range of motion;Orthotic Fit/Training    PT Next Visit Plan need to work on LLE weightbearing, try step-ups, NMR and strengthening, balance, timing and coordination of gait    Consulted and Agree with Plan of Care Patient;Family member/caregiver    Family Member Consulted husband           Patient will benefit from skilled therapeutic intervention in order to improve the following deficits and impairments:  Abnormal gait, Difficulty walking, Decreased range of motion, Impaired tone, Pain, Decreased balance, Decreased mobility, Decreased strength, Postural dysfunction  Visit Diagnosis: Other abnormalities of gait and mobility  Muscle weakness (generalized)     Problem List Patient Active Problem List   Diagnosis Date Noted   Goals of care, counseling/discussion    Palliative care by specialist    Acute CVA (cerebrovascular accident) ([place]) 07/01/2020   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   Anticoagulated on warfarin    Left hemiparesis ([place])    History of CVA with residual deficit    Thrombocytopenia ([place])    Prediabetes    CVA (cerebral vascular accident) ([place]) 09/03/2019   Vertigo 09/24/2017   Benign paroxysmal positional vertigo    History of stroke     Hyperlipemia 08/23/2017   Encounter for therapeutic drug monitoring 08/08/2017   Long term (current) use of anticoagulants [Z79.01] 07/12/2017   Hemiparesis and alteration of sensations as late effects of stroke ([place]) 09/07/2016   Abnormality of gait 09/07/2016   HTN (hypertension) 04/13/2016   H/O mitral valve replacement with mechanical valve 10/16/2015   Diabetes mellitus without complication (HCC)    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

## 2020-08-04 ENCOUNTER — Other Ambulatory Visit: Payer: Self-pay

## 2020-08-04 ENCOUNTER — Ambulatory Visit (INDEPENDENT_AMBULATORY_CARE_PROVIDER_SITE_OTHER): Payer: Medicare Other | Admitting: Interventional Cardiology

## 2020-08-04 ENCOUNTER — Ambulatory Visit: Payer: Medicare Other | Admitting: Occupational Therapy

## 2020-08-04 ENCOUNTER — Encounter: Payer: Self-pay | Admitting: Occupational Therapy

## 2020-08-04 DIAGNOSIS — R278 Other lack of coordination: Secondary | ICD-10-CM

## 2020-08-04 DIAGNOSIS — R29818 Other symptoms and signs involving the nervous system: Secondary | ICD-10-CM | POA: Diagnosis not present

## 2020-08-04 DIAGNOSIS — R2681 Unsteadiness on feet: Secondary | ICD-10-CM | POA: Diagnosis not present

## 2020-08-04 DIAGNOSIS — M6281 Muscle weakness (generalized): Secondary | ICD-10-CM

## 2020-08-04 DIAGNOSIS — Z5181 Encounter for therapeutic drug level monitoring: Secondary | ICD-10-CM

## 2020-08-04 DIAGNOSIS — R29898 Other symptoms and signs involving the musculoskeletal system: Secondary | ICD-10-CM | POA: Diagnosis not present

## 2020-08-04 DIAGNOSIS — R2689 Other abnormalities of gait and mobility: Secondary | ICD-10-CM | POA: Diagnosis not present

## 2020-08-04 LAB — POCT INR: INR: 3.4 — AB (ref 2.0–3.0)

## 2020-08-04 NOTE — Addendum Note (Signed)
Addended by: Vianne Bulls D on: 08/04/2020 07:39 PM   Modules accepted: Orders

## 2020-08-04 NOTE — Therapy (Addendum)
[facility] [address] [place], [place], [place], [address] Phone: [phone]   Fax:  [phone]  Occupational Therapy Treatment  Patient Details  Name: [name] MRN: [id number] Date of Birth: [birth year] Referring Provider (OT): Dr. Letta Pate   Encounter Date: 08/04/2020   OT End of Session - 08/04/20 1615    Visit Number 1    Number of Visits 10    Date for OT Re-Evaluation 09/10/20    Authorization Type Medicare/AARP, Medicare guidelines    Authorization - Visit Number 1    Authorization - Number of Visits 10    Progress Note Due on Visit 10    OT Start Time 0850    OT Stop Time 0930    OT Time Calculation (min) 40 min    Activity Tolerance Patient tolerated treatment well    Behavior During Therapy [facility] for tasks assessed/performed           Past Medical History:  Diagnosis Date  . Abnormality of gait 09/07/2016  . Allergy   . Diabetes mellitus without complication [facility])    Patient denies this - notes history of glucose intolerance  . GERD (gastroesophageal reflux disease)   . Hemiparesis and alteration of sensations as late effects of stroke ([place]) 09/07/2016  . History of pneumonia 1997  . Hypertension   . S/P MVR (mitral valve replacement)    Mechanical mitral valve replacement at age 74 (done in [place])  // echo 7/17: EF 55-60, normal wall motion, bileaflet mechanical mitral valve prosthesis functioning normally, mild LAE, mildly reduced RVSF, small pericardial effusion  . Stroke [facility]) 1997, 2013, 2015    Past Surgical History:  Procedure Laterality Date  . ABDOMINAL HYSTERECTOMY    . BRAIN SURGERY    . BUNIONECTOMY  1993  . CARDIAC VALVE REPLACEMENT  1997  . TOE SURGERY  1996    There were no vitals filed for this visit.   Subjective Assessment - 08/04/20 0903    Subjective  Pt reports that she hasn't noticed any RUE changes, Pt reports incr spasticity of LUE and weakness in LLE, decline in  ambulation from recent CVA    Pertinent History acute CVA, small L frontal lobe possible infarct; hospitalized 07/01/2020 and transferred to inpatient rehab, discharged home 07/16/2020.   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 Yes    Pain Score 1     Pain Location Leg    Pain Orientation Left    Pain Descriptors / Indicators Aching;Sore    Pain Type Chronic pain    Pain Onset More than a month ago    Pain Frequency Intermittent    Aggravating Factors  standing alot    Pain Relieving Factors Bengay, Tylenol    Effect of Pain on Daily Activities OT will monitor, but not directly address as a goal at this time              [facility] OT Assessment - 08/04/20 0001      Assessment   Medical Diagnosis acute CVA    Onset Date/Surgical Date 07/01/20    Hand Dominance Right    Prior Therapy d/c OT 12/05/19 from prior CVA, CIR during recent hospitalization      Precautions  Precautions Fall      Balance Screen   Has the patient fallen in the past 6 months No      Home  Environment   Family/patient expects to be discharged to: Private residence    Lives With [place] with household mobility without device;Independent with community mobility with device;Independent with basic ADLs    Leisure Enjoys walking with husband in neighborhood, day trips/shopping at [employer] stores      ADL   Eating/Feeding Modified independent   has a Ship broker Modified independent    Upper Body Bathing Modified independent   occasional min A for back   Lower Body Bathing Modified independent    Upper Body Dressing --   mod I, has button hook   Lower Body Dressing Modified independent    Toilet Transfer Modified independent    Where Assessed -  Toileting Clothing Manipulationn Modified independent    Tub/Shower Transfer --   prefers to wash at sink   ADL comments difficulty opening containers, hasn't returned to walking in park or gym      IADL   Prior Level of Function Shopping goes together for some shopping, husband does grocery shopping.  Needs assistance for heavier tasks.    Prior Level of Function Light Housekeeping pt reports at prior level (simple tasks)    Prior Level of Function Meal Prep performs simple crock pot tasks/meal prep  (but does not enjoy cooking)    Prior Level of Function Community Mobility had completed driving evaluation after last CVA and had resumed driving several weeks prior to recent CVA    Community Mobility --   has not returned to driving     Mobility   Mobility Status Comments Ambulates without device in the home and with single point cane in community--see PT eval for details.  Pt noted to hold LUE in flexor synergy pattern without arm swing.      Vision - History   Additional Comments Pt denies visual changes from recent CVA.  Pt reports floaters and aura that was present prior      Cognition   Overall Cognitive Status History of cognitive impairments - at baseline   appropriate to participate in eval/answer questions   Cognition Comments Pt reports memory deficits prior (names, etc.); however, pt/husband deny changes from recent CVA      Posture/Postural Control   Posture/Postural Control Postural limitations    Posture Comments In sitting, demo posterior pelvic tilt      Sensation   Light Touch Appears Intact    Additional Comments pt reports occasional numbness/tingling in morning in L hand      Coordination   9 Hole Peg Test Right;Left    Right 9 Hole Peg Test 28.25    Left 9 Hole Peg Test 66.63    Box and Blocks R-46blocks, L-27blocks      Tone   Assessment Location Left Upper Extremity      ROM / Strength   AROM / PROM / Strength AROM;Strength      AROM   Overall AROM   Deficits    Overall AROM Comments L shoulder flex 125* and -35* elbow ext, decr ER.  RUE grossly WNL      Strength   Overall Strength Deficits    Overall Strength Comments R proximal strength 5/5, L proximal strength grossly 3+ to 4- (however spasticity noted)  Hand Function   Right Hand Grip (lbs) 48.5    Left Hand Grip (lbs) 34.3      LUE Tone   LUE Tone Mild   throughout LUE                                OT Long Term Goals - 08/04/20 1906      OT LONG TERM GOAL #1   Title Pt and husband will be independent with updated HEP to address LUE coordination, strength, and spasticity management-- check LTGs  09/09/20    Time 5    Period Weeks    Status New      OT LONG TERM GOAL #2   Title Pt will decr spasticity to be able to ambulate at least 30 feet with only minimal elbow flexion and no shoulder hike of LUE.    Time 5    Period Weeks    Status New      OT LONG TERM GOAL #3   Title Pt will improve LUE coordination as shown by improving time on 9-hole peg test by at least 8sec.    Baseline 66.63sec    Time 5    Period Weeks    Status New      OT LONG TERM GOAL #4   Title Pt will demo at least -25* L elbow ext with functional reach.    Baseline -35*    Time 5    Period Weeks    Status New      OT LONG TERM GOAL #5   Title Pt will improve functional reaching/coordination with LUE as shown by improving score on box and blocks test by at least 4 blocks.    Baseline L-27 blocks    Time 5    Period Weeks    Status New                 Plan - 08/04/20 1851    Clinical Impression Statement Pt is a 74 y.o. female s/p recent hospitalization for CVA 07/01/20 (d/c 07/16/20).  Pt with PMH that includes hx of multiple R hemisphere CVAs/TIAs with residual mild L spastic hemiparesis, mechanical heart valve, DM.  Pt reports performing BADLs mod I but that she notes incr LUE spasticity, LLE weakness, and decline in functional mobility for ADLs/leisure  tasks.  Pt had to returned to driving prior to recent CVA.  Pt presents today with spastic L hemiparesis, decr coordination, and decr functional mobility.    OT Occupational Profile and History Detailed Assessment- Review of Records and additional review of physical, cognitive, psychosocial history related to current functional performance    Occupational performance deficits (Please refer to evaluation for details): ADL's;IADL's;Leisure;Social Participation    Body Structure / Function / Physical Skills ADL;Balance;Mobility;Tone;Strength;UE functional use;ROM;IADL;Dexterity;Coordination;FMC    Rehab Potential Good    Clinical Decision Making Several treatment options, min-mod task modification necessary    Comorbidities Affecting Occupational Performance: May have comorbidities impacting occupational performance    Modification or Assistance to Complete Evaluation  Min-Moderate modification of tasks or assist with assess necessary to complete eval    OT Frequency 2x / week    OT Duration --   x 5 weeks + eval   OT Treatment/Interventions Self-care/ADL training;Therapeutic exercise;Functional Mobility Training;Balance training;Manual Therapy;Neuromuscular education;Ultrasound;Energy conservation;Therapeutic activities;DME and/or AE instruction;Electrical Stimulation;Patient/family education;Passive range of motion;Moist Heat;Cryotherapy    Plan initiate HEP for LUE (wt. bearing, cane exercises, coordination  HEP) or update prior HEP if pt brings in    Consulted and Agree with Plan of Care Patient;Family member/caregiver    Family Member Consulted husband           Patient will benefit from skilled therapeutic intervention in order to improve the following deficits and impairments:   Body Structure / Function / Physical Skills: ADL, Balance, Mobility, Tone, Strength, UE functional use, ROM, IADL, Dexterity, Coordination, [facility]       Visit Diagnosis: Muscle weakness (generalized)  Other lack  of coordination  Unsteadiness on feet  Other symptoms and signs involving the nervous system  Other symptoms and signs involving the musculoskeletal system    Problem List Patient Active Problem List   Diagnosis Date Noted  . Goals of care, counseling/discussion   . Palliative care by specialist   . Acute CVA (cerebrovascular accident) ([place]) 07/01/2020  . 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 Anticoagulated on warfarin   . Left hemiparesis ([place])   . History of CVA with residual deficit   . Thrombocytopenia ([place])   . Prediabetes   . CVA (cerebral vascular accident) ([place]) 09/03/2019  . Vertigo 09/24/2017  . Benign paroxysmal positional vertigo   . History of stroke   . Hyperlipemia 08/23/2017  . Encounter for therapeutic drug monitoring 08/08/2017  . Long term (current) use of anticoagulants [Z79.01] 07/12/2017  . Hemiparesis and alteration of sensations as late effects of stroke ([place]) 09/07/2016  . Abnormality of gait 09/07/2016  . HTN (hypertension) 04/13/2016  . H/O mitral valve replacement with mechanical valve 10/16/2015  . Diabetes mellitus without complication ([place])   . 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 [place], [place]  [address] [phone] phone [phone] 08/04/20 7:36 PM

## 2020-08-04 NOTE — Patient Instructions (Signed)
Description   Spoke with patient and instructed her to continue same dosage 5mg  daily except 7.5mg  on Mondays and Fridays. Does weekly checks due to insurance. Recheck in 1 week. [facility]: [phone]

## 2020-08-05 ENCOUNTER — Encounter: Payer: Self-pay | Admitting: Adult Health

## 2020-08-05 ENCOUNTER — Ambulatory Visit (INDEPENDENT_AMBULATORY_CARE_PROVIDER_SITE_OTHER): Payer: Medicare Other | Admitting: Adult Health

## 2020-08-05 VITALS — BP 115/69 | HR 84 | Ht 67.0 in | Wt 131.0 lb

## 2020-08-05 DIAGNOSIS — I10 Essential (primary) hypertension: Secondary | ICD-10-CM | POA: Diagnosis not present

## 2020-08-05 DIAGNOSIS — I639 Cerebral infarction, unspecified: Secondary | ICD-10-CM

## 2020-08-05 DIAGNOSIS — E785 Hyperlipidemia, unspecified: Secondary | ICD-10-CM | POA: Diagnosis not present

## 2020-08-05 DIAGNOSIS — Z7901 Long term (current) use of anticoagulants: Secondary | ICD-10-CM | POA: Diagnosis not present

## 2020-08-05 DIAGNOSIS — G8194 Hemiplegia, unspecified affecting left nondominant side: Secondary | ICD-10-CM

## 2020-08-05 NOTE — Patient Instructions (Addendum)
Continue working with PT/OT for left sided symptoms and spasticity (stiffness)  Continue aspirin 81 mg daily and warfarin daily  and atorvastatin 40mg  daily for secondary stroke prevention  INR goal 3.0-3.5 and continued follow up with cardiology   Continue to follow up with PCP regarding cholesterol and blood pressure management  Maintain strict control of hypertension with blood pressure goal below 130/90 and cholesterol with LDL cholesterol (bad cholesterol) goal below 70 mg/dL.      Followup in the future with me in 4 months or call earlier if needed     Thank you for coming to see [place] at [facility]. I hope we have been able to provide you high quality care today.  You may receive a patient satisfaction survey over the next few weeks. We would appreciate your feedback and comments so that we may continue to improve ourselves and the health of our patients.

## 2020-08-05 NOTE — Progress Notes (Signed)
[facility] [address] [place]. [place] [address] [phone]       STROKE FOLLOW UP NOTE  [name] Date of Birth:  10-05-1946 Medical Record Number:  [id number]   Reason for Referral: stroke follow up    CHIEF COMPLAINT:  Chief Complaint  Patient presents with  . Follow-up    tx rm  . Cerebrovascular Accident    HPI:  [name] is a 74 y.o. 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  Intermittent lightheadedness/dizziness possibly BPPV with prior history of BPPV 2018.  Recommend continuation of aspirin monitor with INR goal 3.0-3.5 with INR on admission 2.9.  HTN stable.  LDL 75 and increase atorvastatin to 40 mg daily.  History of multiple prior strokes as listed below.  Evaluated by therapies and discharged to CIR for comprehensive rehab program.  Since discharge, she reports increased spasticity of LUE and weakness in LLE and decline in ambulation.  She experienced similar to the setback after stroke extension in 08/2019.  She continues to work with neuro rehab PT and started OT yesterday.  She declines right-sided deficits.  No reoccurring dizziness for BPPV type symptoms.  Currently using a cane for ambulation and denies any recent falls. INR levels have been flucuating with recent INR level 3.1.  She does monitor at home - Dr. Cathie Olden and Coumadin clinic manages levels.  Remains on warfarin and aspirin without bleeding or bruising.  Remains on atorvastatin without myalgias.  Blood pressure today 115/69.  No concerns at this time.     History  provided for reference purposes only Update 01/28/2020 JM: [name] is a 74 year old female who is being seen today, 01/28/2020, for stroke follow-up.  She has been stable from a stroke standpoint with stable chronic left hemiparesis and subjective lack of sensation LLE.  She continues to work with neuro rehab PT with ongoing benefit.  Continues to use a cane outdoors and use of AFO brace but ambulates without AD in her own home without falls.  Denies new or worsening stroke/TIA symptoms.  Continues on aspirin and warfarin without bleeding or bruising with recent INR level satisfactory at 3.1 with goal 3-3.5.  Continues on atorvastatin without myalgias.  Blood pressure today 118/70.  No concerns at this time.  Initial visit 10/22/2019 JM: [name] is a 74 year old female who is being seen today for hospital follow-up.  She continues to have mild left hemiparesis but has been experiencing issues with left hip pain and therapy believes possibly left hip bursitis and plans on evaluation by physical medicine rehab Dr. Letta Pate this Friday, 10/26/2019.  She continues to work with PT/OT with ongoing improvement.  Continues on aspirin and Coumadin without bleeding or bruising.  INR previously stable with levels remaining within goal range but today 2.9 with adjustment to Coumadin doses and ongoing follow-up with Coumadin clinic.  Continues on atorvastatin without myalgias.  Glucose levels stable.  Blood pressure today 102/71.  No further concerns   Stroke admission 09/03/2019: [name] is a 74 y.o. female with history of multiple strokess s/p craniotomy in the past presented on 09/03/2019 with  increasedhemiparesis of the left arm and leg.  Stroke work-up showed acute extension along the anterior margin of old infarct with unknown etiology possibly failure of collaterals and history of mechanical heart valve on long-term AC and INR suboptimal at 2.1. MRI does confirm acute extension along the anterior margin  of old infarction right parietal cortical and subcortical brain. Per Dr. Leonie Man during admission,"it is possible she had a transient hypoperfusion event as a new embolic event to the same distribution as the previous stroke would be unlikely.. The strokes are relatively small, and given the high risk of mechanical heart valve, I think it is reasonable to continue anticoagulation".  CTA head/neck mild arthrosclerosis but no evidence of stenosis.  2D echo EF of 55% with abnormal septal motion due to postop effect, LA severely dilated, RA mild dilation, small pericardial effusion and mild calcification/thickening of aortic valve.  Previously on aspirin and Coumadin and recommended continuation with INR goal 3-3.5.  HTN stable.  LDL 65 and recommended continuation of atorvastatin 20 mg daily.  Controlled DM with A1c 6.1.  Other stroke risk factors include advanced age, prior history of stroke, CAD and valve replacement.  She was discharged to [facility] for ongoing therapy needs.       ROS:   14 system review of systems performed and negative with exception of joint pain and left-sided weakness  PMH:  Past Medical History:  Diagnosis Date  . Abnormality of gait 09/07/2016  . Allergy   . Diabetes mellitus without complication [facility])    Patient denies this - notes history of glucose intolerance  . GERD (gastroesophageal reflux disease)   . Hemiparesis and alteration of sensations as late effects of stroke ([place]) 09/07/2016  . History of pneumonia 1997  . Hypertension   . S/P MVR (mitral valve replacement)    Mechanical mitral valve replacement at age 64 (done in [place])  // echo 7/17: EF 55-60, normal wall motion, bileaflet mechanical mitral valve prosthesis functioning normally, mild LAE, mildly reduced RVSF, small pericardial effusion  . Stroke [facility]) 1997, 2013, 2015    PSH:  Past Surgical History:  Procedure Laterality Date  . ABDOMINAL HYSTERECTOMY    . BRAIN SURGERY    . BUNIONECTOMY  1993  .  CARDIAC VALVE REPLACEMENT  1997  . TOE SURGERY  1996    Social History:  Social History   Socioeconomic History  . Marital status: Married    Spouse name: Not on file  . Number of children: 2  . 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 Highest education level: Not on file  Occupational History  . Occupation: Retired  Tobacco Use  . Smoking status: Never Smoker  . Smokeless tobacco: Never Used  Vaping Use  . Vaping Use: Never used  Substance and Sexual Activity  . Alcohol use: No    Alcohol/week: 0.0 standard drinks  . Drug use: No  . Sexual activity: Not on file    Comment: Married  Other Topics Concern  . Not on file  Social History Narrative   Lives at home w/ her husband   Right-handed   Caffeine: none   Social Determinants of Health   Financial Resource Strain:   . Difficulty of Paying Living Expenses: Not on file  Food Insecurity: No Food Insecurity  . Worried About Charity fundraiser in the Last Year: Never true  . Ran Out of Food in the Last Year: Never true  Transportation Needs: No Transportation Needs  .  Lack of Transportation (Medical): No  . Lack of Transportation (Non-Medical): No  Physical Activity:   . Days of Exercise per Week: Not on file  . Minutes of Exercise per Session: Not on file  Stress:   . Feeling of Stress : Not on file  Social Connections:   . Frequency of Communication with Friends and Family: Not on file  . Frequency of Social Gatherings with Friends and Family: Not on file  . Attends Religious Services: Not on file  . Active Member of Clubs or Organizations: Not on file  . Attends Archivist Meetings: Not on file  . Marital Status: Not on file  Intimate Partner Violence:   . Fear of Current or Ex-Partner: Not on file  . Emotionally Abused: Not on file  . Physically Abused: Not on file  . Sexually Abused: Not on file    Family History:  Family History  Problem Relation Age of Onset  . Cancer Mother         Bone  . Heart disease Mother   . Hyperlipidemia Mother   . Hypertension Mother   . Stroke Father   . Hypertension Father   . Heart attack Neg Hx     Medications:   Current Outpatient Medications on File Prior to Visit  Medication Sig Dispense Refill  . acetaminophen (TYLENOL) 325 MG tablet Take 2 tablets (650 mg total) by mouth every 4 (four) hours as needed for mild pain (or temp > 37.5 C (99.5 F)).    Marland Kitchen amLODipine (NORVASC) 5 MG tablet Take 1 tablet (5 mg total) by mouth at bedtime. 30 tablet 0  . aspirin EC 81 MG tablet Take 81 mg by mouth daily.     Marland Kitchen atorvastatin (LIPITOR) 40 MG tablet Take 1 tablet (40 mg total) by mouth at bedtime. 30 tablet 2  . calcium carbonate 1250 MG capsule Take 1,200 mg by mouth at bedtime.     . cholecalciferol (VITAMIN D) 1000 UNITS tablet Take 2,000 Units daily by mouth.     . denosumab (PROLIA) 60 MG/ML SOSY injection Inject 60 mg into the skin every 6 (six) months.    . docusate sodium (COLACE) 100 MG capsule Take 100 mg at bedtime by mouth.     . Menthol, Topical Analgesic, (BENGAY EX) Apply 1 application topically daily. All painful areas    . omeprazole (PRILOSEC) 20 MG capsule Take 20 mg by mouth daily.    . pantoprazole (PROTONIX) 40 MG tablet Take 1 tablet (40 mg total) by mouth daily. 30 tablet 1  . Propylene Glycol-Glycerin (SOOTHE) 0.6-0.6 % SOLN Place 1 drop into both eyes 2 (two) times daily as needed (dry eyes).    . warfarin (COUMADIN) 5 MG tablet Take 1-1.5 tablets (5-7.5 mg total) by mouth daily. Take 7.5mg  (one and a half tablets) on Monday and Friday; Take 5mg  (1 tablet) on all other days. OR AS DIRECTED PER COUMADIN CLINIC 45 tablet 0   No current facility-administered medications on file prior to visit.    Allergies:   Allergies  Allergen Reactions  . Zoloft [Sertraline] Hives, Itching and Rash     Physical Exam  Vitals:   08/05/20 1004  BP: 115/69  Pulse: 84  Weight: 131 lb (59.4 kg)  Height: 5\' 7"  (1.702 m)   Body  mass index is 20.52 kg/m. No exam data present  General: well developed, well nourished, very pleasant elderly Caucasian female, seated, in no evident distress Head: head normocephalic and  atraumatic.   Neck: supple with no carotid or supraclavicular bruits Cardiovascular: regular rate and rhythm, no murmurs; mechanical valve click Musculoskeletal: no deformity Skin:  no rash/petichiae Vascular:  Normal pulses all extremities   Neurologic Exam Mental Status: Awake and fully alert.   Fluent speech and language.  Oriented to place and time. Recent and remote memory intact. Attention span, concentration and fund of knowledge appropriate. Mood and affect appropriate.  Cranial Nerves: Fundoscopic exam reveals sharp disc margins. Pupils equal, briskly reactive to light. Extraocular movements with horizontal nystagmus with 2-3 beat left gaze and mild continuous right gaze -chronic per patient.  Visual fields full to confrontation. Hearing intact. Facial sensation intact. Face, tongue, palate moves normally and symmetrically.  Motor: Normal bulk and tone.  Chronic left hemiparesis 4/5 and left foot drop with use of AFO with increased spasticity LUE Sensory.: intact to touch , pinprick , position and vibratory sensation.  Coordination: Rapid alternating movements normal in all extremities except mildly decreased left hand. Finger-to-nose and heel-to-shin positive for apraxia on left side. Gait and Station: Arises from chair with mild difficulty. Stance is normal. Gait demonstrates  mild left-sided hemiplegic gait and use of cane Reflexes: 1+ and symmetric. Toes downgoing.       ASSESSMENT: TYLEIGH Massey is a 74 y.o. year old female with history of increased left hemiparesis on 09/03/2019 with stroke work-up showing acute extension along the anterior margin of old infarct secondary to unclear etiology with possible transient hypoperfusion.  Recent admission for dizziness likely BPPV with incidental  finding of left frontal stroke.  Vascular risk factors include multiple prior strokes, HTN, HLD, mechanical heart valve on AC with INR goal 3-3.5, advanced age and CAD.      PLAN:  1. Hx of strokes with exception:  a. Residual left hemiparesis slightly worsened from baseline with recent stroke.  Continue to work with therapy for ongoing improvement.   b. Continue aspirin 81 mg daily and warfarin daily  and atorvastatin for secondary stroke prevention c. INR goal 3-3.5 d. Discussed importance of close PCP follow-up for aggressive stroke risk factor management. 2. L frontal stroke, incidental finding: Asymptomatic.  See #1. 3. BPPV:  no reocurring events 4. HTN: BP<130/90.  Stable.  Managed by PCP. 5. HLD: LDL goal<70.  Continue atorvastatin.  Managed by PCP. 6. Mechanical heart valve: Continue warfarin with INR goal 3-3.5 managed and monitored by cardiology   Follow-up in 4 months or call earlier if needed   I spent 35 minutes of face-to-face and non-face-to-face time with patient.  This included previsit chart review including review of recent hospitalization, lab review, study review, order entry, electronic health record documentation, patient education regarding importance of secondary stroke risk factor management, residual deficits and importance of fall prevention and answered all questions to patient satisfaction    Frann Rider, AGNP-BC  [facility] [address] [place], [place] [address]  Phone [phone] Fax [phone] Note: This document was prepared with digital dictation and possible smart phrase technology. Any transcriptional errors that result from this process are unintentional.

## 2020-08-06 ENCOUNTER — Other Ambulatory Visit: Payer: Self-pay

## 2020-08-06 ENCOUNTER — Ambulatory Visit: Payer: Medicare Other

## 2020-08-06 DIAGNOSIS — R2689 Other abnormalities of gait and mobility: Secondary | ICD-10-CM

## 2020-08-06 DIAGNOSIS — R278 Other lack of coordination: Secondary | ICD-10-CM | POA: Diagnosis not present

## 2020-08-06 DIAGNOSIS — M6281 Muscle weakness (generalized): Secondary | ICD-10-CM

## 2020-08-06 DIAGNOSIS — R29898 Other symptoms and signs involving the musculoskeletal system: Secondary | ICD-10-CM | POA: Diagnosis not present

## 2020-08-06 DIAGNOSIS — R2681 Unsteadiness on feet: Secondary | ICD-10-CM | POA: Diagnosis not present

## 2020-08-06 DIAGNOSIS — R29818 Other symptoms and signs involving the nervous system: Secondary | ICD-10-CM | POA: Diagnosis not present

## 2020-08-06 NOTE — Progress Notes (Signed)
I agree with the above plan 

## 2020-08-06 NOTE — Therapy (Signed)
[facility] [address] [place], [place], [address] Phone: [phone]   Fax:  [phone]  Physical Therapy Treatment  Patient Details  Name: [name] MRN: [id number] Date of Birth: [birth year] Referring Provider (PT): Marlowe Shores, PA-C (Dr. Percell Miller for follow-up)   Encounter Date: 08/06/2020   PT End of Session - 08/06/20 1321    Visit Number 4    Number of Visits 17    Date for PT Re-Evaluation 44/31/54   90 day cert for 8 wk POC   Authorization Type Medicare/AARP    PT Start Time 1318    PT Stop Time 1359    PT Time Calculation (min) 41 min    Equipment Utilized During Treatment Gait belt    Activity Tolerance Patient tolerated treatment well    Behavior During Therapy [facility] for tasks assessed/performed           Past Medical History:  Diagnosis Date  . Abnormality of gait 09/07/2016  . Allergy   . Diabetes mellitus without complication [facility])    Patient denies this - notes history of glucose intolerance  . GERD (gastroesophageal reflux disease)   . Hemiparesis and alteration of sensations as late effects of stroke ([place]) 09/07/2016  . History of pneumonia 1997  . Hypertension   . S/P MVR (mitral valve replacement)    Mechanical mitral valve replacement at age 80 (done in [place])  // echo 7/17: EF 55-60, normal wall motion, bileaflet mechanical mitral valve prosthesis functioning normally, mild LAE, mildly reduced RVSF, small pericardial effusion  . Stroke [facility]) 1997, 2013, 2015    Past Surgical History:  Procedure Laterality Date  . ABDOMINAL HYSTERECTOMY    . BRAIN SURGERY    . BUNIONECTOMY  1993  . CARDIAC VALVE REPLACEMENT  1997  . TOE SURGERY  1996    There were no vitals filed for this visit.   Subjective Assessment - 08/06/20 1322    Subjective Pt reports that she is doing pretty good. She still has more pain in left hip first thing in morning and later in evening.     Patient is accompained by: Family member   Husband, [name]   Patient Stated Goals Pt's goals for therapy are to get back to where I was before-walking more independently with cane.    Currently in Pain? Yes    Pain Score 0-No pain   5/10 when first gets up.   Pain Location Hip    Pain Orientation Left    Pain Descriptors / Indicators Aching    Pain Type Chronic pain    Pain Onset More than a month ago    Pain Frequency Intermittent                             [facility] Adult PT Treatment/Exercise - 08/06/20 1325      Ambulation/Gait   Ambulation/Gait Yes    Ambulation/Gait Assistance 5: Supervision    Ambulation/Gait Assistance Details around in clinic during session    Assistive device Straight cane    Gait Pattern Step-through pattern;Decreased hip/knee flexion - left      Neuro Re-ed    Neuro Re-ed Details  Gait around obstacle course 115' x 3 with weaving in and out of 4 cones, reciprocal steps over 4 low hurdles and reciprocal steps over floor ladder. CGA/min assist over hurdles and occasionally over floor laddder. Pt had some difficulty clearing left foot  when trailing. USing SPC throughout. Next to counter stepping over 4 hurdles with [facility] for support with reciprocal steps x 4 then with side stepping over cones with counter support with verbal cues to leave room for trailing foot. Standing on rockerboard positioned ant/posterior trying to maintain level x 30 sec then with holding ball in front x 10 sec x 2. Then turned board lateral and performed standing maintaining level x 30 sec with PT providing tactile cues at pelvis to equalize weight. Pt reports 2/10 pain in left lateral thigh after.                    PT Short Term Goals - 07/21/20 1512      PT SHORT TERM GOAL #1   Title Pt will be independent with HEP for improved strength, balance, transfers, and gait.  TARGET 08/22/2020    Time 4    Period Weeks    Status New      PT SHORT TERM GOAL #2   Title  Pt will improve TUG score to less than or equal to 20 seconds for decreased fall risk.    Baseline 25.81 sec at eval    Time 4    Period Weeks    Status New      PT SHORT TERM GOAL #3   Title Pt will improve BERG to at least 42/56 for decreased fall risk.    Baseline 37/56 at eval    Time 4    Period Weeks    Status New      PT SHORT TERM GOAL #4   Title Pt will improve 5x sit<>stand to less than or equal to 15 seconds for improved functional strength.    Baseline 19.97 sec at eval    Time 4    Period Weeks    Status New      PT SHORT TERM GOAL #5   Title Pt will ambulate at least 50 ft, indoor and household surfaces, no device, independently for improved household independence.    Time 4    Period Weeks    Status New             PT Long Term Goals - 07/21/20 2001      PT LONG TERM GOAL #1   Title Pt will be independent with progression of HEP for improved strength, balance, transfers, and gait.  TARGET 09/19/2020    Time 8    Period Weeks    Status New      PT LONG TERM GOAL #2   Title Pt will improve TUG score to less than or equal to 15 seconds for decreased fall risk.    Time 8    Period Weeks    Status New      PT LONG TERM GOAL #3   Title Pt will improve gait velocity to at least 2 ft/sec for improved gait efficiency and safety and decreased fall risk.    Baseline 1.4 ft/sec at eval    Time 8    Period Weeks    Status New      PT LONG TERM GOAL #4   Title Pt will ambulate at least 500 ft, indoors/outdoors, modified independently for improved community gait.    Time 8    Period Weeks    Status New      PT LONG TERM GOAL #5   Title Pt will improve FOTO score at discharge by at least 10% to demonstrate improved overall functional  mobility.    Time 8    Period Weeks    Status New                 Plan - 08/06/20 1606    Clinical Impression Statement Pt was challenged with stepping over objects and unsteady at times. Pt also has decreased  stability on compliant surfaces. Left hip did well throughout session with only 2/10 pain at end.    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    Examination-Activity Limitations Locomotion Level;Transfers;Stairs;Stand    Examination-Participation Restrictions Community Activity;Other    Rehab Potential Good    PT Frequency 2x / week    PT Duration 8 weeks    PT Treatment/Interventions ADLs/Self Care Home Management;DME Instruction;Gait training;Stair training;Functional mobility training;Therapeutic activities;Therapeutic exercise;Balance training;Neuromuscular re-education;Patient/family education;Passive range of motion;Orthotic Fit/Training    PT Next Visit Plan need to work on LLE weightbearing, try step-ups, NMR and strengthening, balance, timing and coordination of gait    Consulted and Agree with Plan of Care Patient;Family member/caregiver    Family Member Consulted husband           Patient will benefit from skilled therapeutic intervention in order to improve the following deficits and impairments:  Abnormal gait, Difficulty walking, Decreased range of motion, Impaired tone, Pain, Decreased balance, Decreased mobility, Decreased strength, Postural dysfunction  Visit Diagnosis: Other abnormalities of gait and mobility  Muscle weakness (generalized)     Problem List Patient Active Problem List   Diagnosis Date Noted  . Goals of care, counseling/discussion   . Palliative care by specialist   . Acute CVA (cerebrovascular accident) ([place]) 07/01/2020  . Subcortical infarction ([place]) 09/05/2019  . Anticoagulated on warfarin   . Left hemiparesis ([place])   . History of CVA with residual deficit   . Thrombocytopenia ([place])   . Prediabetes   . CVA (cerebral vascular accident) ([place]) 09/03/2019  . Vertigo 09/24/2017  . Benign paroxysmal positional vertigo   . History of stroke   .  Hyperlipemia 08/23/2017  . Encounter for therapeutic drug monitoring 08/08/2017  . Long term (current) use of anticoagulants [Z79.01] 07/12/2017  . Hemiparesis and alteration of sensations as late effects of stroke ([place]) 09/07/2016  . Abnormality of gait 09/07/2016  . HTN (hypertension) 04/13/2016  . H/O mitral valve replacement with mechanical valve 10/16/2015  . Diabetes mellitus without complication ([place])   . Hemiparesis (L sided - mild) due to old stroke ([place])     Electa Sniff, PT, DPT, NCS 08/06/2020, 4:11 PM  [facility] [address] [place], [place], [address] Phone: [phone]   Fax:  [phone]  Name: [name] MRN: [id number] Date of Birth: [birth year]

## 2020-08-08 ENCOUNTER — Ambulatory Visit: Payer: Medicare Other | Attending: Physician Assistant | Admitting: Physical Therapy

## 2020-08-08 ENCOUNTER — Encounter: Payer: Self-pay | Admitting: Physical Therapy

## 2020-08-08 ENCOUNTER — Other Ambulatory Visit: Payer: Self-pay

## 2020-08-08 DIAGNOSIS — M6281 Muscle weakness (generalized): Secondary | ICD-10-CM | POA: Insufficient documentation

## 2020-08-08 DIAGNOSIS — R29898 Other symptoms and signs involving the musculoskeletal system: Secondary | ICD-10-CM | POA: Diagnosis not present

## 2020-08-08 DIAGNOSIS — R29818 Other symptoms and signs involving the nervous system: Secondary | ICD-10-CM | POA: Insufficient documentation

## 2020-08-08 DIAGNOSIS — R482 Apraxia: Secondary | ICD-10-CM | POA: Insufficient documentation

## 2020-08-08 DIAGNOSIS — R2689 Other abnormalities of gait and mobility: Secondary | ICD-10-CM

## 2020-08-08 DIAGNOSIS — R293 Abnormal posture: Secondary | ICD-10-CM | POA: Diagnosis not present

## 2020-08-08 DIAGNOSIS — R2681 Unsteadiness on feet: Secondary | ICD-10-CM | POA: Diagnosis not present

## 2020-08-08 DIAGNOSIS — I69354 Hemiplegia and hemiparesis following cerebral infarction affecting left non-dominant side: Secondary | ICD-10-CM | POA: Insufficient documentation

## 2020-08-08 DIAGNOSIS — R278 Other lack of coordination: Secondary | ICD-10-CM | POA: Insufficient documentation

## 2020-08-08 DIAGNOSIS — R270 Ataxia, unspecified: Secondary | ICD-10-CM | POA: Insufficient documentation

## 2020-08-08 NOTE — Therapy (Signed)
[facility] [address] [place], [place], [address] Phone: [phone]   Fax:  [phone]  Physical Therapy Treatment  Patient Details  Name: [name] MRN: [id number] Date of Birth: 07-23-1946 Referring Provider (PT): Marlowe Shores, PA-C (Dr. Percell Miller for follow-up)   Encounter Date: 08/08/2020   PT End of Session - 08/08/20 1107    Visit Number 5    Number of Visits 17    Date for PT Re-Evaluation 26/37/85   90 day cert for 8 wk POC   Authorization Type Medicare/AARP    PT Start Time 1104    PT Stop Time 1145    PT Time Calculation (min) 41 min    Equipment Utilized During Treatment Gait belt    Activity Tolerance Patient tolerated treatment well    Behavior During Therapy [facility] for tasks assessed/performed           Past Medical History:  Diagnosis Date  . Abnormality of gait 09/07/2016  . Allergy   . Diabetes mellitus without complication [facility])    Patient denies this - notes history of glucose intolerance  . GERD (gastroesophageal reflux disease)   . Hemiparesis and alteration of sensations as late effects of stroke ([place]) 09/07/2016  . History of pneumonia 1997  . Hypertension   . S/P MVR (mitral valve replacement)    Mechanical mitral valve replacement at age 52 (done in [place])  // echo 7/17: EF 55-60, normal wall motion, bileaflet mechanical mitral valve prosthesis functioning normally, mild LAE, mildly reduced RVSF, small pericardial effusion  . Stroke [facility] 1997, 2013, 2015    Past Surgical History:  Procedure Laterality Date  . ABDOMINAL HYSTERECTOMY    . BRAIN SURGERY    . BUNIONECTOMY  1993  . CARDIAC VALVE REPLACEMENT  1997  . TOE SURGERY  1996    There were no vitals filed for this visit.   Subjective Assessment - 08/08/20 1105    Subjective No new complaints. No falls. HEP is going well, still challenging. No change in chronic hip pain.    Patient is accompained  by: Family member   spouse, [name]   Patient Stated Goals Pt's goals for therapy are to get back to where I was before-walking more independently with cane.    Currently in Pain? Yes    Pain Score 2     Pain Location Hip    Pain Orientation Left    Pain Descriptors / Indicators Aching;Sore    Pain Type Chronic pain    Pain Onset More than a month ago    Pain Frequency Intermittent    Aggravating Factors  increased standing or activity    Pain Relieving Factors Beng[place], Tylenol, Ice massage                  [facility] Adult PT Treatment/Exercise - 08/08/20 1108      Transfers   Transfers Sit to Stand;Stand to Sit    Sit to Stand 5: Supervision;With upper extremity assist;From bed;From chair/3-in-1    Stand to Sit 5: Supervision;With upper extremity assist;To chair/3-in-1;To bed      Ambulation/Gait   Ambulation/Gait Yes    Ambulation/Gait Assistance 5: Supervision    Ambulation/Gait Assistance Details around clinic with session    Assistive device Straight cane    Gait Pattern Step-through pattern;Decreased hip/knee flexion - left    Ambulation Surface Level;Indoor      Neuro Re-ed    Neuro Re-ed Details  for balance/muscle  re-ed: use of floor ladder to work on increased step length and increased left stance time on left LE for 6 laps with min assist/faciliation for weight shifitng.       Knee/Hip Exercises: Aerobic   Other Aerobic Scifit LE's level 3.0 for 5 minutes with goal >/= 40 rpm for strengthening and to promote increased left LE weight bearing.       Knee/Hip Exercises: Machines for Strengthening   Cybex Leg Press 50# bil LE's 2 sets of 10 reps, then left LE only 30# for 2 sets of 10 reps.  assist needed for knee stability with extension.                Balance Exercises - 08/08/20 0001      Balance Exercises: Standing   Rockerboard Anterior/posterior;EO;EC;30 seconds;Other reps (comment);Limitations    Rockerboard Limitations rocking the board with EO working  on posture and ankle motions with min to mod assist, no UE support. then holding the board steady for EC 30 sec's x 3 reps with min to mod assist, cues on posture/weight shifitng to assist balance with no UE support.                PT Short Term Goals - 07/21/20 1512      PT SHORT TERM GOAL #1   Title Pt will be independent with HEP for improved strength, balance, transfers, and gait.  TARGET 08/22/2020    Time 4    Period Weeks    Status New      PT SHORT TERM GOAL #2   Title Pt will improve TUG score to less than or equal to 20 seconds for decreased fall risk.    Baseline 25.81 sec at eval    Time 4    Period Weeks    Status New      PT SHORT TERM GOAL #3   Title Pt will improve BERG to at least 42/56 for decreased fall risk.    Baseline 37/56 at eval    Time 4    Period Weeks    Status New      PT SHORT TERM GOAL #4   Title Pt will improve 5x sit<>stand to less than or equal to 15 seconds for improved functional strength.    Baseline 19.97 sec at eval    Time 4    Period Weeks    Status New      PT SHORT TERM GOAL #5   Title Pt will ambulate at least 50 ft, indoor and household surfaces, no device, independently for improved household independence.    Time 4    Period Weeks    Status New             PT Long Term Goals - 07/21/20 2001      PT LONG TERM GOAL #1   Title Pt will be independent with progression of HEP for improved strength, balance, transfers, and gait.  TARGET 09/19/2020    Time 8    Period Weeks    Status New      PT LONG TERM GOAL #2   Title Pt will improve TUG score to less than or equal to 15 seconds for decreased fall risk.    Time 8    Period Weeks    Status New      PT LONG TERM GOAL #3   Title Pt will improve gait velocity to at least 2 ft/sec for improved gait efficiency and safety and decreased  fall risk.    Baseline 1.4 ft/sec at eval    Time 8    Period Weeks    Status New      PT LONG TERM GOAL #4   Title Pt will  ambulate at least 500 ft, indoors/outdoors, modified independently for improved community gait.    Time 8    Period Weeks    Status New      PT LONG TERM GOAL #5   Title Pt will improve FOTO score at discharge by at least 10% to demonstrate improved overall functional mobility.    Time 8    Period Weeks    Status New                 Plan - 08/08/20 1107    Clinical Impression Statement Today's skilled session continued to focus on left>right LE strengthening, gait mechanics and balance reactions on compliant surfaces with rest breaks taken as needed. Left hip pain after session was rated as 2-3/10. The pt is progressing and should benefit from continued PT to progress toward unmet goals.    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    Examination-Activity Limitations Locomotion Level;Transfers;Stairs;Stand    Examination-Participation Restrictions Community Activity;Other    Rehab Potential Good    PT Frequency 2x / week    PT Duration 8 weeks    PT Treatment/Interventions ADLs/Self Care Home Management;DME Instruction;Gait training;Stair training;Functional mobility training;Therapeutic activities;Therapeutic exercise;Balance training;Neuromuscular re-education;Patient/family education;Passive range of motion;Orthotic Fit/Training    PT Next Visit Plan need to work on LLE weightbearing, try step-ups, NMR and strengthening, balance, timing and coordination of gait    Consulted and Agree with Plan of Care Patient;Family member/caregiver    Family Member Consulted husband           Patient will benefit from skilled therapeutic intervention in order to improve the following deficits and impairments:  Abnormal gait, Difficulty walking, Decreased range of motion, Impaired tone, Pain, Decreased balance, Decreased mobility, Decreased strength, Postural dysfunction  Visit  Diagnosis: Other abnormalities of gait and mobility  Muscle weakness (generalized)  Unsteadiness on feet     Problem List Patient Active Problem List   Diagnosis Date Noted  . Goals of care, counseling/discussion   . Palliative care by specialist   . Acute CVA (cerebrovascular accident) ([place]) 07/01/2020  . 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 Anticoagulated on warfarin   . Left hemiparesis ([place])   . History of CVA with residual deficit   . Thrombocytopenia ([place])   . Prediabetes   . CVA (cerebral vascular accident) ([place]) 09/03/2019  . Vertigo 09/24/2017  . Benign paroxysmal positional vertigo   . History of stroke   . Hyperlipemia 08/23/2017  . Encounter for therapeutic drug monitoring 08/08/2017  . Long term (current) use of anticoagulants [Z79.01] 07/12/2017  . Hemiparesis and alteration of sensations as late effects of stroke ([place]) 09/07/2016  . Abnormality of gait 09/07/2016  . HTN (hypertension) 04/13/2016  . H/O mitral valve replacement with mechanical valve 10/16/2015  . Diabetes mellitus without complication ([place])   . Hemiparesis (L sided - mild) due to old stroke [facility])     Willow Ora 08/08/2020, 4:44 PM  [facility] [address] [place], [place], [address] Phone: [phone]   Fax:  [phone]  Name: [name] MRN: [id number] Date of Birth: 1946-02-09

## 2020-08-11 ENCOUNTER — Ambulatory Visit (INDEPENDENT_AMBULATORY_CARE_PROVIDER_SITE_OTHER): Payer: Medicare Other | Admitting: *Deleted

## 2020-08-11 DIAGNOSIS — Z7901 Long term (current) use of anticoagulants: Secondary | ICD-10-CM | POA: Diagnosis not present

## 2020-08-11 DIAGNOSIS — I69359 Hemiplegia and hemiparesis following cerebral infarction affecting unspecified side: Secondary | ICD-10-CM | POA: Diagnosis not present

## 2020-08-11 DIAGNOSIS — Z5181 Encounter for therapeutic drug level monitoring: Secondary | ICD-10-CM | POA: Diagnosis not present

## 2020-08-11 DIAGNOSIS — Z952 Presence of prosthetic heart valve: Secondary | ICD-10-CM | POA: Diagnosis not present

## 2020-08-11 LAB — POCT INR: INR: 2.9 (ref 2.0–3.0)

## 2020-08-11 NOTE — Patient Instructions (Addendum)
Description   Spoke with patient and instructed her to take 10mg  of Warfarin today then continue same dosage 5mg  daily except 7.5mg  on Mondays and Fridays. Does weekly checks due to insurance. Recheck in 1 week. Coumadin Clinic: [phone]

## 2020-08-12 ENCOUNTER — Encounter: Payer: Self-pay | Admitting: Physical Therapy

## 2020-08-12 ENCOUNTER — Ambulatory Visit: Payer: Medicare Other | Admitting: Occupational Therapy

## 2020-08-12 ENCOUNTER — Ambulatory Visit: Payer: Medicare Other | Admitting: Physical Therapy

## 2020-08-12 ENCOUNTER — Other Ambulatory Visit: Payer: Self-pay

## 2020-08-12 DIAGNOSIS — M6281 Muscle weakness (generalized): Secondary | ICD-10-CM | POA: Diagnosis not present

## 2020-08-12 DIAGNOSIS — R2689 Other abnormalities of gait and mobility: Secondary | ICD-10-CM | POA: Diagnosis not present

## 2020-08-12 DIAGNOSIS — I69354 Hemiplegia and hemiparesis following cerebral infarction affecting left non-dominant side: Secondary | ICD-10-CM

## 2020-08-12 DIAGNOSIS — R278 Other lack of coordination: Secondary | ICD-10-CM

## 2020-08-12 DIAGNOSIS — R2681 Unsteadiness on feet: Secondary | ICD-10-CM | POA: Diagnosis not present

## 2020-08-12 DIAGNOSIS — R29818 Other symptoms and signs involving the nervous system: Secondary | ICD-10-CM | POA: Diagnosis not present

## 2020-08-12 NOTE — Patient Instructions (Signed)
  Coordination Activities  Perform the following activities for 15 minutes 1-2 times per day with left hand(s).   Rotate ball in hand flipping ball over and over  Toss ball between hands.  Flip cards 1 at a time as fast as you can.  Deal cards with your thumb (Hold deck in hand and push card off top with thumb).  Rotate card in hand (clockwise and counter-clockwise).  Pick up pennies and place in container or coin bank.  Pick up nickels one at a time until you get 5 in your hand, then move coins from palm to fingertips to stack one at a time.  make a sandwich/snack, tying shoes, washing dishes using both hands  SHOULDER: Flexion Bilateral    Raise arms to EYE LEVEL at same speed holding paper towel roll. Keep elbows straight. _10__ reps per set, _2__ sets per day  SHOULDER: Flexion - Supine (Cane)    Hold PAPER TOWEL ROLL in both hands. Raise arms up overhead. Do not allow back to arch. Hold __2_ seconds. _10__ reps per set,  _2__ days per day

## 2020-08-12 NOTE — Therapy (Signed)
[facility] [address] [place], [place], [address] Phone: [phone]   Fax:  [phone]  Occupational Therapy Treatment  Patient Details  Name: [name] MRN: [id number] Date of Birth: Apr 14, 1946 Referring Provider (OT): Dr. Alysia Penna (for follow-up)   Encounter Date: 08/12/2020   OT End of Session - 08/12/20 1013    Visit Number 2    Number of Visits 10    Date for OT Re-Evaluation 09/10/20    Authorization Type Medicare/AARP, Medicare guidelines    Authorization - Visit Number 2    Authorization - Number of Visits 10    Progress Note Due on Visit 10    OT Start Time 0845    OT Stop Time 0930    OT Time Calculation (min) 45 min    Activity Tolerance Patient tolerated treatment well    Behavior During Therapy [facility] for tasks assessed/performed           Past Medical History:  Diagnosis Date  . Abnormality of gait 09/07/2016  . Allergy   . Diabetes mellitus without complication [facility])    Patient denies this - notes history of glucose intolerance  . GERD (gastroesophageal reflux disease)   . Hemiparesis and alteration of sensations as late effects of stroke ([place]) 09/07/2016  . History of pneumonia 1997  . Hypertension   . S/P MVR (mitral valve replacement)    Mechanical mitral valve replacement at age 28 (done in [place])  // echo 7/17: EF 55-60, normal wall motion, bileaflet mechanical mitral valve prosthesis functioning normally, mild LAE, mildly reduced RVSF, small pericardial effusion  . Stroke [facility]) 1997, 2013, 2015    Past Surgical History:  Procedure Laterality Date  . ABDOMINAL HYSTERECTOMY    . BRAIN SURGERY    . BUNIONECTOMY  1993  . CARDIAC VALVE REPLACEMENT  1997  . TOE SURGERY  1996    There were no vitals filed for this visit.   Subjective Assessment - 08/12/20 0845    Subjective  Pt reports that she hasn't noticed any RUE changes, Pt reports incr spasticity of LUE and  weakness in LLE, decline in ambulation from recent CVA    Pertinent History acute CVA, small L frontal lobe possible infarct; hospitalized 07/01/2020 and transferred to inpatient rehab, discharged home 07/16/2020.   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 No/denies    Pain Onset More than a month ago             Issued HEP for coordination activities - some were modified according to her ability. See pt instructions for details.  Pt also issued shoulder ex's for neuro re-education seated and supine for bilateral shoulder flexion using paper towel roll - see pt instructions for details. Pt required v.c's to prevent sh compensations into IR and abduction.                    OT Education - 08/12/20 0926    Education Details shoulder and coordination HEP    Person(s) Educated Patient;Spouse    Methods Explanation;Demonstration;Handout;Verbal cues    Comprehension Verbalized understanding;Returned demonstration;Verbal cues required               OT Long Term Goals - 08/12/20  [place] #1   Title Pt and husband will be independent with updated HEP to address LUE coordination, strength, and spasticity management-- check LTGs  09/09/20    Time 5    Period Weeks    Status On-going      OT LONG TERM GOAL #2   Title Pt will decr spasticity to be able to ambulate at least 30 feet with only minimal elbow flexion and no shoulder hike of LUE.    Time 5    Period Weeks    Status New      OT LONG TERM GOAL #3   Title Pt will improve LUE coordination as shown by improving time on 9-hole peg test by at least 8sec.    Baseline 66.63sec    Time 5    Period Weeks    Status New      OT LONG TERM GOAL #4   Title Pt will demo at least -25* L elbow ext with functional  reach.    Baseline -35*    Time 5    Period Weeks    Status New      OT LONG TERM GOAL #5   Title Pt will improve functional reaching/coordination with LUE as shown by improving score on box and blocks test by at least 4 blocks.    Baseline L-27 blocks    Time 5    Period Weeks    Status New                 Plan - 08/12/20 1222    Clinical Impression Statement Pt progressing with coordination Lt hand. Pt has more difficulty controlling UE in reaching patterns    Occupational performance deficits (Please refer to evaluation for details): ADL's;IADL's;Leisure;Social Participation    Body Structure / Function / Physical Skills ADL;Balance;Mobility;Tone;Strength;UE functional use;ROM;IADL;Dexterity;Coordination;FMC    OT Frequency 2x / week    OT Duration --   5 weeks   OT Treatment/Interventions Self-care/ADL training;Therapeutic exercise;Functional Mobility Training;Balance training;Manual Therapy;Neuromuscular education;Ultrasound;Energy conservation;Therapeutic activities;DME and/or AE instruction;Electrical Stimulation;Patient/family education;Passive range of motion;Moist Heat;Cryotherapy    Plan review HEP prn, add weight bearing as able    Consulted and Agree with Plan of Care Patient;Family member/caregiver    Family Member Consulted husband           Patient will benefit from skilled therapeutic intervention in order to improve the following deficits and impairments:   Body Structure / Function / Physical Skills: ADL, Balance, Mobility, Tone, Strength, UE functional use, ROM, IADL, Dexterity, Coordination, [facility]       Visit Diagnosis: Hemiplegia and hemiparesis following cerebral infarction affecting left non-dominant side (HCC)  Other lack of coordination  Muscle weakness (generalized)    Problem List Patient Active Problem List   Diagnosis Date Noted  . Goals of care, counseling/discussion   . Palliative care by specialist   . Acute CVA (cerebrovascular  accident) ([place]) 07/01/2020  . 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 Anticoagulated on warfarin   . Left hemiparesis ([place])   . History of CVA with residual deficit   . Thrombocytopenia ([place])   . Prediabetes   . CVA (cerebral vascular accident) ([place]) 09/03/2019  . Vertigo 09/24/2017  . Benign paroxysmal positional vertigo   . History of stroke   . Hyperlipemia 08/23/2017  . Encounter for therapeutic drug monitoring 08/08/2017  . Long term (current) use of anticoagulants [Z79.01] 07/12/2017  . Hemiparesis and alteration of sensations as late  effects of stroke ([place]) 09/07/2016  . Abnormality of gait 09/07/2016  . HTN (hypertension) 04/13/2016  . H/O mitral valve replacement with mechanical valve 10/16/2015  . Diabetes mellitus without complication ([place])   . Hemiparesis (L sided - mild) due to old stroke [facility])     Carey Bullocks, OTR/L 08/12/2020, 12:25 PM  [facility] [address] [place], [place], [address] Phone: [phone]   Fax:  [phone]  Name: [name] MRN: [id number] Date of Birth: Dec 09, 1945

## 2020-08-13 NOTE — Therapy (Signed)
[facility] [address] [place], [place], [address] Phone: [phone]   Fax:  [phone]  Physical Therapy Treatment  Patient Details  Name: [name] MRN: [id number] Date of Birth: 1946/11/08 Referring Provider (PT): Marlowe Shores, PA-C (Dr. Percell Miller for follow-up)   Encounter Date: 08/12/2020   PT End of Session - 08/12/20 0934    Visit Number 6    Number of Visits 17    Date for PT Re-Evaluation 14/97/02   90 day cert for 8 wk POC   Authorization Type Medicare/AARP    PT Start Time 0932    PT Stop Time 1014    PT Time Calculation (min) 42 min    Equipment Utilized During Treatment Gait belt    Activity Tolerance Patient tolerated treatment well    Behavior During Therapy [facility] for tasks assessed/performed           Past Medical History:  Diagnosis Date  . Abnormality of gait 09/07/2016  . Allergy   . Diabetes mellitus without complication [facility])    Patient denies this - notes history of glucose intolerance  . GERD (gastroesophageal reflux disease)   . Hemiparesis and alteration of sensations as late effects of stroke ([place]) 09/07/2016  . History of pneumonia 1997  . Hypertension   . S/P MVR (mitral valve replacement)    Mechanical mitral valve replacement at age 74 (done in [place])  // echo 7/17: EF 55-60, normal wall motion, bileaflet mechanical mitral valve prosthesis functioning normally, mild LAE, mildly reduced RVSF, small pericardial effusion  . Stroke [facility]) 1997, 2013, 2015    Past Surgical History:  Procedure Laterality Date  . ABDOMINAL HYSTERECTOMY    . BRAIN SURGERY    . BUNIONECTOMY  1993  . CARDIAC VALVE REPLACEMENT  1997  . TOE SURGERY  1996    There were no vitals filed for this visit.   Subjective Assessment - 08/12/20 0933    Subjective No new complaints.. No falls or pain to report.    Patient is accompained by: Family member   spouse, [name]   Patient Stated  Goals Pt's goals for therapy are to get back to where I was before-walking more independently with cane.    Currently in Pain? No/denies                [facility] Adult PT Treatment/Exercise - 08/12/20 0935      Transfers   Transfers Sit to Stand;Stand to Sit    Sit to Stand 5: Supervision;With upper extremity assist;From bed;From chair/3-in-1    Stand to Sit 5: Supervision;With upper extremity assist;To chair/3-in-1;To bed      Ambulation/Gait   Ambulation/Gait Yes    Ambulation/Gait Assistance 5: Supervision    Ambulation/Gait Assistance Details around clinic with session. cues for increased step length with gait.     Assistive device Straight cane    Gait Pattern Step-through pattern;Decreased hip/knee flexion - left    Ambulation Surface Level;Indoor      Knee/Hip Exercises: Aerobic   Other Aerobic Scifit LE's level 3.0 for 5 minutes with goal >/= 40 rpm for strengthening and to promote increased left LE weight bearing.       Knee/Hip Exercises: Standing   Lateral Step Up Left;1 set;10 reps;Hand Hold: 1;Step Height: 4"    Lateral Step Up Limitations contralateral march on non stance LE with cues for form and technique.     Forward Step Up Left;1 set;10 reps;Hand Hold: 1;Step Height: 4";Limitations  Forward Step Up Limitations contralateral march on non stance LE with cues for form and technique.      Knee/Hip Exercises: Supine   Bridges AROM;Strengthening;Both;1 set;10 reps;Limitations    Bridges Limitations with bil LE's: 5 sec holds with each rep with cues for full hip lift; then with left LE only for 10 reps with minimal lifting noted.     Single Leg Bridge AROM;AAROM;Strengthening;Left;1 set;10 reps;Limitations    Other Supine Knee/Hip Exercises with green band resistance: left hip fall outs with 5 sec hold x 10 reps cues for controlled movements. .               Balance Exercises - 08/12/20 1003      Balance Exercises: Standing   Standing Eyes Closed Wide  (BOA);Narrow base of support (BOS);Head turns;Foam/compliant surface;Other reps (comment);30 secs;Limitations    Standing Eyes Closed Limitations on airex with no UE support: feet together EC 30 sec's x 3 reps, then feet wide apart for EC head movements left<>right, up<>down for ~10 reps each. pt with posterior lean needing cues/faciliation to correct, up to min assist.     Balance Beam standing across red foam beam: alternating fwd stepping to floor/back onto beam, then alternating bwd stepping to floor/back onto beam for ~10 reps each with cues needed for increased step length/step height. Light UE support with min guard to min assist.                PT Short Term Goals - 07/21/20 1512      PT SHORT TERM GOAL #1   Title Pt will be independent with HEP for improved strength, balance, transfers, and gait.  TARGET 08/22/2020    Time 4    Period Weeks    Status New      PT SHORT TERM GOAL #2   Title Pt will improve TUG score to less than or equal to 20 seconds for decreased fall risk.    Baseline 25.81 sec at eval    Time 4    Period Weeks    Status New      PT SHORT TERM GOAL #3   Title Pt will improve BERG to at least 42/56 for decreased fall risk.    Baseline 37/56 at eval    Time 4    Period Weeks    Status New      PT SHORT TERM GOAL #4   Title Pt will improve 5x sit<>stand to less than or equal to 15 seconds for improved functional strength.    Baseline 19.97 sec at eval    Time 4    Period Weeks    Status New      PT SHORT TERM GOAL #5   Title Pt will ambulate at least 50 ft, indoor and household surfaces, no device, independently for improved household independence.    Time 4    Period Weeks    Status New             PT Long Term Goals - 07/21/20 2001      PT LONG TERM GOAL #1   Title Pt will be independent with progression of HEP for improved strength, balance, transfers, and gait.  TARGET 09/19/2020    Time 8    Period Weeks    Status New      PT  LONG TERM GOAL #2   Title Pt will improve TUG score to less than or equal to 15 seconds for decreased fall risk.  Time 8    Period Weeks    Status New      PT LONG TERM GOAL #3   Title Pt will improve gait velocity to at least 2 ft/sec for improved gait efficiency and safety and decreased fall risk.    Baseline 1.4 ft/sec at eval    Time 8    Period Weeks    Status New      PT LONG TERM GOAL #4   Title Pt will ambulate at least 500 ft, indoors/outdoors, modified independently for improved community gait.    Time 8    Period Weeks    Status New      PT LONG TERM GOAL #5   Title Pt will improve FOTO score at discharge by at least 10% to demonstrate improved overall functional mobility.    Time 8    Period Weeks    Status New             08/12/20 0935  Plan  Clinical Impression Statement Today's skilled session continued to focus on strengthening, gait and balance reactions. Rest breaks taken as needed. The pt is progressing toward goals and should benefit from continued PT to progress toward unmet goals.  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  Examination-Activity Limitations Locomotion Level;Transfers;Stairs;Stand  Examination-Participation Restrictions Community Activity;Other  Pt will benefit from skilled therapeutic intervention in order to improve on the following deficits Abnormal gait;Difficulty walking;Decreased range of motion;Impaired tone;Pain;Decreased balance;Decreased mobility;Decreased strength;Postural dysfunction  Rehab Potential Good  PT Frequency 2x / week  PT Duration 8 weeks  PT Treatment/Interventions ADLs/Self Care Home Management;DME Instruction;Gait training;Stair training;Functional mobility training;Therapeutic activities;Therapeutic exercise;Balance training;Neuromuscular re-education;Patient/family education;Passive range of motion;Orthotic  Fit/Training  PT Next Visit Plan need to work on LLE weightbearing, try step-ups, NMR and strengthening, balance, timing and coordination of gait  Consulted and Agree with Plan of Care Patient;Family member/caregiver  Family Member Consulted husband          Patient will benefit from skilled therapeutic intervention in order to improve the following deficits and impairments:  Abnormal gait, Difficulty walking, Decreased range of motion, Impaired tone, Pain, Decreased balance, Decreased mobility, Decreased strength, Postural dysfunction  Visit Diagnosis: Other abnormalities of gait and mobility  Muscle weakness (generalized)  Unsteadiness on feet     Problem List Patient Active Problem List   Diagnosis Date Noted  . Goals of care, counseling/discussion   . Palliative care by specialist   . Acute CVA (cerebrovascular accident) ([place]) 07/01/2020  . 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 Anticoagulated on warfarin   . Left hemiparesis ([place])   . History of CVA with residual deficit   . Thrombocytopenia ([place])   . Prediabetes   . CVA (cerebral vascular accident) ([place]) 09/03/2019  . Vertigo 09/24/2017  . Benign paroxysmal positional vertigo   . History of stroke   . Hyperlipemia 08/23/2017  . Encounter for therapeutic drug monitoring 08/08/2017  . Long term (current) use of anticoagulants [Z79.01] 07/12/2017  . Hemiparesis and alteration of sensations as late effects of stroke ([place]) 09/07/2016  . Abnormality of gait 09/07/2016  . HTN (hypertension) 04/13/2016  . H/O mitral valve replacement with mechanical valve 10/16/2015  . Diabetes mellitus without complication ([place])   . Hemiparesis (L sided - mild) due to old stroke ([place])     Willow Ora, PTA, [facility] [address] [place], [place] [address] [phone] 08/13/20,  1:13 PM   Name: NARALY FRITCHER MRN: [id number] Date of Birth: 1946/02/08

## 2020-08-14 ENCOUNTER — Ambulatory Visit: Payer: Medicare Other

## 2020-08-14 ENCOUNTER — Ambulatory Visit: Payer: Medicare Other | Admitting: Occupational Therapy

## 2020-08-14 ENCOUNTER — Other Ambulatory Visit: Payer: Self-pay

## 2020-08-14 DIAGNOSIS — R2681 Unsteadiness on feet: Secondary | ICD-10-CM

## 2020-08-14 DIAGNOSIS — I69354 Hemiplegia and hemiparesis following cerebral infarction affecting left non-dominant side: Secondary | ICD-10-CM | POA: Diagnosis not present

## 2020-08-14 DIAGNOSIS — R29818 Other symptoms and signs involving the nervous system: Secondary | ICD-10-CM | POA: Diagnosis not present

## 2020-08-14 DIAGNOSIS — M6281 Muscle weakness (generalized): Secondary | ICD-10-CM

## 2020-08-14 DIAGNOSIS — R2689 Other abnormalities of gait and mobility: Secondary | ICD-10-CM

## 2020-08-14 DIAGNOSIS — R278 Other lack of coordination: Secondary | ICD-10-CM | POA: Diagnosis not present

## 2020-08-14 NOTE — Therapy (Signed)
[facility] [address] [place], [place], [address] Phone: [phone]   Fax:  [phone]  Physical Therapy Treatment  Patient Details  Name: [name] MRN: [id number] Date of Birth: June 11, 1946 Referring Provider (PT): Marlowe Shores, PA-C (Dr. Percell Miller for follow-up)   Encounter Date: 08/14/2020   PT End of Session - 08/14/20 1138    Visit Number 7    Number of Visits 17    Date for PT Re-Evaluation 38/10/17   90 day cert for 8 wk POC   Authorization Type Medicare/AARP    PT Start Time 1136    PT Stop Time 1219    PT Time Calculation (min) 43 min    Equipment Utilized During Treatment Gait belt    Activity Tolerance Patient tolerated treatment well    Behavior During Therapy [facility] for tasks assessed/performed           Past Medical History:  Diagnosis Date  . Abnormality of gait 09/07/2016  . Allergy   . Diabetes mellitus without complication [facility])    Patient denies this - notes history of glucose intolerance  . GERD (gastroesophageal reflux disease)   . Hemiparesis and alteration of sensations as late effects of stroke ([place]) 09/07/2016  . History of pneumonia 1997  . Hypertension   . S/P MVR (mitral valve replacement)    Mechanical mitral valve replacement at age 67 (done in [place])  // echo 7/17: EF 55-60, normal wall motion, bileaflet mechanical mitral valve prosthesis functioning normally, mild LAE, mildly reduced RVSF, small pericardial effusion  . Stroke [facility]) 1997, 2013, 2015    Past Surgical History:  Procedure Laterality Date  . ABDOMINAL HYSTERECTOMY    . BRAIN SURGERY    . BUNIONECTOMY  1993  . CARDIAC VALVE REPLACEMENT  1997  . TOE SURGERY  1996    There were no vitals filed for this visit.   Subjective Assessment - 08/14/20 1139    Subjective Pt reports hip is doing well. Left top of foot a little sore as got new shoes.    Patient is accompained by: Family member    spouse, [name]   Patient Stated Goals Pt's goals for therapy are to get back to where I was before-walking more independently with cane.    Currently in Pain? Yes    Pain Score 1     Pain Location Foot    Pain Orientation Left;Anterior    Pain Descriptors / Indicators Sore    Pain Type Acute pain    Pain Onset In the past 7 days    Pain Frequency Intermittent                             [facility] Adult PT Treatment/Exercise - 08/14/20 1140      Ambulation/Gait   Ambulation/Gait Yes    Ambulation/Gait Assistance 5: Supervision    Ambulation/Gait Assistance Details around in clinic during session    Assistive device Straight cane    Gait Pattern Step-through pattern;Decreased hip/knee flexion - left    Ambulation Surface Level;Indoor      Neuro Re-ed    Neuro Re-ed Details  Pt performed dynamic gait activity over obstacle coures: reciprocal steps over 3 hurdles, weaving in and out of 5 cones, then marching over blue mat x 6 bouts CGA/min assist. Side stepping over 3 hurdles with SPC x 2 bouts each direction min assist with going to right as had  increased difficulty with adducting left leg. Standing on rockerboard positioned ant/post without hands 30 sec x 2 then with 1 UE support with alternating toe taps on cone x 10. Pt reported some pain in left hip as went on.  Side stepping along counter with red theraband around thighs working on controlling steps 6' x 6 then with 1 UE support tapping 4" step with red theraband still around thighs x 10 each leg to try to get more glut med activation as well. Pt only reported a little pain in left thigh on rockerboard.                  PT Education - 08/14/20 1255    Education Details Pt instructed that she could add in side stepping with theraband along counter.    Person(s) Educated Patient;Spouse    Methods Explanation;Demonstration    Comprehension Verbalized understanding            PT Short Term Goals - 07/21/20 1512       PT SHORT TERM GOAL #1   Title Pt will be independent with HEP for improved strength, balance, transfers, and gait.  TARGET 08/22/2020    Time 4    Period Weeks    Status New      PT SHORT TERM GOAL #2   Title Pt will improve TUG score to less than or equal to 20 seconds for decreased fall risk.    Baseline 25.81 sec at eval    Time 4    Period Weeks    Status New      PT SHORT TERM GOAL #3   Title Pt will improve BERG to at least 42/56 for decreased fall risk.    Baseline 37/56 at eval    Time 4    Period Weeks    Status New      PT SHORT TERM GOAL #4   Title Pt will improve 5x sit<>stand to less than or equal to 15 seconds for improved functional strength.    Baseline 19.97 sec at eval    Time 4    Period Weeks    Status New      PT SHORT TERM GOAL #5   Title Pt will ambulate at least 50 ft, indoor and household surfaces, no device, independently for improved household independence.    Time 4    Period Weeks    Status New             PT Long Term Goals - 07/21/20 2001      PT LONG TERM GOAL #1   Title Pt will be independent with progression of HEP for improved strength, balance, transfers, and gait.  TARGET 09/19/2020    Time 8    Period Weeks    Status New      PT LONG TERM GOAL #2   Title Pt will improve TUG score to less than or equal to 15 seconds for decreased fall risk.    Time 8    Period Weeks    Status New      PT LONG TERM GOAL #3   Title Pt will improve gait velocity to at least 2 ft/sec for improved gait efficiency and safety and decreased fall risk.    Baseline 1.4 ft/sec at eval    Time 8    Period Weeks    Status New      PT LONG TERM GOAL #4   Title Pt will ambulate at least 500  ft, indoors/outdoors, modified independently for improved community gait.    Time 8    Period Weeks    Status New      PT LONG TERM GOAL #5   Title Pt will improve FOTO score at discharge by at least 10% to demonstrate improved overall functional  mobility.    Time 8    Period Weeks    Status New                 Plan - 08/14/20 1256    Clinical Impression Statement Pt was anxious with stepping over hurdles at first but improved as went on with only catching right foot slightly one time. Able to clear left foot well. Most challenged with side stepping to right with left hip adduction control.    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    Examination-Activity Limitations Locomotion Level;Transfers;Stairs;Stand    Examination-Participation Restrictions Community Activity;Other    Rehab Potential Good    PT Frequency 2x / week    PT Duration 8 weeks    PT Treatment/Interventions ADLs/Self Care Home Management;DME Instruction;Gait training;Stair training;Functional mobility training;Therapeutic activities;Therapeutic exercise;Balance training;Neuromuscular re-education;Patient/family education;Passive range of motion;Orthotic Fit/Training    PT Next Visit Plan need to work on LLE weightbearing, try step-ups, NMR and strengthening, focus on trying to get more glut med activation, balance, timing and coordination of gait    Consulted and Agree with Plan of Care Patient;Family member/caregiver    Family Member Consulted husband           Patient will benefit from skilled therapeutic intervention in order to improve the following deficits and impairments:  Abnormal gait, Difficulty walking, Decreased range of motion, Impaired tone, Pain, Decreased balance, Decreased mobility, Decreased strength, Postural dysfunction  Visit Diagnosis: Other abnormalities of gait and mobility  Muscle weakness (generalized)  Unsteadiness on feet     Problem List Patient Active Problem List   Diagnosis Date Noted  . Goals of care, counseling/discussion   . Palliative care by specialist   . Acute CVA (cerebrovascular accident) ([place]) 07/01/2020   . 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 Anticoagulated on warfarin   . Left hemiparesis ([place])   . History of CVA with residual deficit   . Thrombocytopenia ([place])   . Prediabetes   . CVA (cerebral vascular accident) ([place]) 09/03/2019  . Vertigo 09/24/2017  . Benign paroxysmal positional vertigo   . History of stroke   . Hyperlipemia 08/23/2017  . Encounter for therapeutic drug monitoring 08/08/2017  . Long term (current) use of anticoagulants [Z79.01] 07/12/2017  . Hemiparesis and alteration of sensations as late effects of stroke ([place]) 09/07/2016  . Abnormality of gait 09/07/2016  . HTN (hypertension) 04/13/2016  . H/O mitral valve replacement with mechanical valve 10/16/2015  . Diabetes mellitus without complication ([place])   . Hemiparesis (L sided - mild) due to old stroke ([place])     Electa Sniff, PT, DPT, NCS 08/14/2020, 12:58 PM  [facility] [address] [place], [place], [address] Phone: [phone]   Fax:  [phone]  Name: [name] MRN: [id number] Date of Birth: 02/02/1946

## 2020-08-18 ENCOUNTER — Ambulatory Visit (INDEPENDENT_AMBULATORY_CARE_PROVIDER_SITE_OTHER): Payer: Medicare Other | Admitting: Pharmacist

## 2020-08-18 DIAGNOSIS — Z5181 Encounter for therapeutic drug level monitoring: Secondary | ICD-10-CM

## 2020-08-18 DIAGNOSIS — Z952 Presence of prosthetic heart valve: Secondary | ICD-10-CM | POA: Diagnosis not present

## 2020-08-18 DIAGNOSIS — I69359 Hemiplegia and hemiparesis following cerebral infarction affecting unspecified side: Secondary | ICD-10-CM

## 2020-08-18 DIAGNOSIS — Z7901 Long term (current) use of anticoagulants: Secondary | ICD-10-CM

## 2020-08-18 LAB — POCT INR: INR: 3.9 — AB (ref 2.0–3.0)

## 2020-08-18 NOTE — Patient Instructions (Signed)
Description   Spoke with patient and instructed her to hold Warfarin today then continue same dosage 5mg  daily except 7.5mg  on Mondays and Fridays. Does weekly checks due to insurance. Recheck in 1 week. Coumadin Clinic: [phone]

## 2020-08-19 ENCOUNTER — Other Ambulatory Visit: Payer: Self-pay

## 2020-08-19 ENCOUNTER — Encounter: Payer: Self-pay | Admitting: Physical Therapy

## 2020-08-19 ENCOUNTER — Ambulatory Visit: Payer: Medicare Other | Admitting: Occupational Therapy

## 2020-08-19 ENCOUNTER — Ambulatory Visit: Payer: Medicare Other | Admitting: Physical Therapy

## 2020-08-19 DIAGNOSIS — R2689 Other abnormalities of gait and mobility: Secondary | ICD-10-CM | POA: Diagnosis not present

## 2020-08-19 DIAGNOSIS — I69354 Hemiplegia and hemiparesis following cerebral infarction affecting left non-dominant side: Secondary | ICD-10-CM

## 2020-08-19 DIAGNOSIS — R278 Other lack of coordination: Secondary | ICD-10-CM | POA: Diagnosis not present

## 2020-08-19 DIAGNOSIS — M6281 Muscle weakness (generalized): Secondary | ICD-10-CM | POA: Diagnosis not present

## 2020-08-19 DIAGNOSIS — R29818 Other symptoms and signs involving the nervous system: Secondary | ICD-10-CM | POA: Diagnosis not present

## 2020-08-19 DIAGNOSIS — R2681 Unsteadiness on feet: Secondary | ICD-10-CM

## 2020-08-19 NOTE — Therapy (Signed)
[facility] [address] [place], [place], [address] Phone: [phone]   Fax:  [phone]  Physical Therapy Treatment  Patient Details  Name: [name] MRN: [id number] Date of Birth: 10-06-1946 Referring Provider (PT): Marlowe Shores, PA-C (Dr. Percell Miller for follow-up)   Encounter Date: 08/19/2020   PT End of Session - 08/19/20 0937    Visit Number 8    Number of Visits 17    Date for PT Re-Evaluation 88/82/80   90 day cert for 8 wk POC   Authorization Type Medicare/AARP    PT Start Time 0934    PT Stop Time 1015    PT Time Calculation (min) 41 min    Equipment Utilized During Treatment Gait belt    Activity Tolerance Patient tolerated treatment well    Behavior During Therapy [facility] for tasks assessed/performed           Past Medical History:  Diagnosis Date  . Abnormality of gait 09/07/2016  . Allergy   . Diabetes mellitus without complication [facility])    Patient denies this - notes history of glucose intolerance  . GERD (gastroesophageal reflux disease)   . Hemiparesis and alteration of sensations as late effects of stroke ([place]) 09/07/2016  . History of pneumonia 1997  . Hypertension   . S/P MVR (mitral valve replacement)    Mechanical mitral valve replacement at age 53 (done in [place])  // echo 7/17: EF 55-60, normal wall motion, bileaflet mechanical mitral valve prosthesis functioning normally, mild LAE, mildly reduced RVSF, small pericardial effusion  . Stroke [facility]) 1997, 2013, 2015    Past Surgical History:  Procedure Laterality Date  . ABDOMINAL HYSTERECTOMY    . BRAIN SURGERY    . BUNIONECTOMY  1993  . CARDIAC VALVE REPLACEMENT  1997  . TOE SURGERY  1996    There were no vitals filed for this visit.   Subjective Assessment - 08/19/20 0936    Subjective No new complatins. No falls or pain to report. HEP is going weel with no issues.    Patient is accompained by: Family member    spouse, [name]   Patient Stated Goals Pt's goals for therapy are to get back to where I was before-walking more independently with cane.    Currently in Pain? No/denies    Pain Score 0-No pain              [facility] PT Assessment - 08/19/20 0939      Berg Balance Test   Sit to Stand Able to stand without using hands and stabilize independently    Standing Unsupported Able to stand safely 2 minutes    Sitting with Back Unsupported but Feet Supported on Floor or Stool Able to sit safely and securely 2 minutes    Stand to Sit Sits safely with minimal use of hands    Transfers Able to transfer safely, minor use of hands    Standing Unsupported with Eyes Closed Able to stand 10 seconds safely    Standing Unsupported with Feet Together Able to place feet together independently and stand 1 minute safely    From Standing, Reach Forward with Outstretched Arm Can reach confidently >25 cm (10")    From Standing Position, Pick up Object from Floor Able to pick up shoe safely and easily    From Standing Position, Turn to Look Behind Over each Shoulder Looks behind one side only/other side shows less weight shift   right>left side  Turn 360 Degrees Able to turn 360 degrees safely but slowly   >6 sec's both ways   Standing Unsupported, Alternately Place Feet on Step/Stool Able to complete >2 steps/needs minimal assist   29.35 sec's, loss of balance with 8th tap   Standing Unsupported, One Foot in Front Able to plae foot ahead of the other independently and hold 30 seconds    Standing on One Leg Able to lift leg independently and hold 5-10 seconds   6.88 sec's on RLE   Total Score 48    Berg comment: 46-51 = moderate risk for falls      Timed Up and Go Test   TUG Normal TUG    Normal TUG (seconds) 16.1   with cane   TUG Comments Scores >13.5 sec indicates increased fall risk.               [facility] Adult PT Treatment/Exercise - 08/19/20 0939      Transfers   Transfers Sit to Stand;Stand to Sit      Sit to Stand 5: Supervision;With upper extremity assist;From bed;From chair/3-in-1    Five time sit to stand comments  14.79 sec's no UE support from standard height surface    Stand to Sit 5: Supervision;With upper extremity assist;To chair/3-in-1;To bed      Ambulation/Gait   Ambulation/Gait Yes    Ambulation/Gait Assistance 5: Supervision;4: Min guard    Ambulation/Gait Assistance Details min gaurd for safety with gait with no AD with no balance issues noted. cues to relax left UE with gait.     Ambulation Distance (Feet) 115 Feet   X1 no AD, plus around gym with session with cane   Assistive device Straight cane;None    Gait Pattern Step-through pattern;Decreased hip/knee flexion - left    Ambulation Surface Level;Indoor               Balance Exercises - 08/19/20 1004      Balance Exercises: Standing   Standing Eyes Closed Wide (BOA);Head turns;Foam/compliant surface;Other reps (comment);30 secs;Limitations    Standing Eyes Closed Limitations on open dense blue foam with feet apart: EC 30 sec's x 3 reps, then EC head movements left<>right, up<>down for 10 reps. min assist due to posterior lean/balance loss preference. cues/faciliation for posture/equal LE weight bearing.                PT Short Term Goals - 08/19/20 7829      PT SHORT TERM GOAL #1   Title Pt will be independent with HEP for improved strength, balance, transfers, and gait.  TARGET 08/22/2020    Baseline 08/19/20: met with current program, will benefit from advancement as pt improves    Status Achieved      PT SHORT TERM GOAL #2   Title Pt will improve TUG score to less than or equal to 20 seconds for decreased fall risk.    Baseline 08/19/20: 16.1 sec's with cane    Time --    Period --    Status Achieved      PT SHORT TERM GOAL #3   Title Pt will improve BERG to at least 42/56 for decreased fall risk.    Baseline 08/19/20: 48/56 scored today    Time --    Period --    Status Achieved       PT SHORT TERM GOAL #4   Title Pt will improve 5x sit<>stand to less than or equal to 15 seconds for improved functional strength.  Baseline 08/19/20: 14.79 sec's no UE support from standard height surface    Time --    Period --    Status Achieved      PT SHORT TERM GOAL #5   Title Pt will ambulate at least 50 ft, indoor and household surfaces, no device, independently for improved household independence.    Baseline 08/19/20: met in session today    Time --    Period --    Status Achieved             PT Long Term Goals - 07/21/20 2001      PT LONG TERM GOAL #1   Title Pt will be independent with progression of HEP for improved strength, balance, transfers, and gait.  TARGET 09/19/2020    Time 8    Period Weeks    Status New      PT LONG TERM GOAL #2   Title Pt will improve TUG score to less than or equal to 15 seconds for decreased fall risk.    Time 8    Period Weeks    Status New      PT LONG TERM GOAL #3   Title Pt will improve gait velocity to at least 2 ft/sec for improved gait efficiency and safety and decreased fall risk.    Baseline 1.4 ft/sec at eval    Time 8    Period Weeks    Status New      PT LONG TERM GOAL #4   Title Pt will ambulate at least 500 ft, indoors/outdoors, modified independently for improved community gait.    Time 8    Period Weeks    Status New      PT LONG TERM GOAL #5   Title Pt will improve FOTO score at discharge by at least 10% to demonstrate improved overall functional mobility.    Time 8    Period Weeks    Status New                 Plan - 08/19/20 9242    Clinical Impression Statement Today's skilled session focused on progress toward STGs with all goals met. Remainder of session continued to focus on balance on compliant surface with vision removed. The pt is progressing toward goals and should benefit from continued PT to progress toward unmet goals.    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    Examination-Activity Limitations Locomotion Level;Transfers;Stairs;Stand    Examination-Participation Restrictions Community Activity;Other    Rehab Potential Good    PT Frequency 2x / week    PT Duration 8 weeks    PT Treatment/Interventions ADLs/Self Care Home Management;DME Instruction;Gait training;Stair training;Functional mobility training;Therapeutic activities;Therapeutic exercise;Balance training;Neuromuscular re-education;Patient/family education;Passive range of motion;Orthotic Fit/Training    PT Next Visit Plan need to work on LLE weightbearing, try step-ups, NMR and strengthening, focus on trying to get more glut med activation, balance, timing and coordination of gait    Consulted and Agree with Plan of Care Patient;Family member/caregiver    Family Member Consulted husband           Patient will benefit from skilled therapeutic intervention in order to improve the following deficits and impairments:  Abnormal gait, Difficulty walking, Decreased range of motion, Impaired tone, Pain, Decreased balance, Decreased mobility, Decreased strength, Postural dysfunction  Visit Diagnosis: Other abnormalities of gait and mobility  Muscle weakness (generalized)  Unsteadiness  on feet  Hemiplegia and hemiparesis following cerebral infarction affecting left non-dominant side [facility])     Problem List Patient Active Problem List   Diagnosis Date Noted  . Goals of care, counseling/discussion   . Palliative care by specialist   . Acute CVA (cerebrovascular accident) ([place]) 07/01/2020  . 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 Anticoagulated on warfarin   . Left hemiparesis ([place])   . History of CVA with residual deficit   . Thrombocytopenia ([place])   . Prediabetes   . CVA (cerebral vascular accident) ([place]) 09/03/2019  . Vertigo 09/24/2017  . Benign paroxysmal positional vertigo   . History of  stroke   . Hyperlipemia 08/23/2017  . Encounter for therapeutic drug monitoring 08/08/2017  . Long term (current) use of anticoagulants [Z79.01] 07/12/2017  . Hemiparesis and alteration of sensations as late effects of stroke ([place]) 09/07/2016  . Abnormality of gait 09/07/2016  . HTN (hypertension) 04/13/2016  . H/O mitral valve replacement with mechanical valve 10/16/2015  . Diabetes mellitus without complication ([place])   . Hemiparesis (L sided - mild) due to old stroke ([place])     Willow Ora, PTA, [facility] [address], [place], [place] [address] [phone] 08/19/20, 10:42 AM   Name: [name] MRN: [id number] Date of Birth: [birth year]

## 2020-08-19 NOTE — Therapy (Signed)
[facility] [address] [place], [place], [address] Phone: [phone]   Fax:  [phone]  Occupational Therapy Treatment  Patient Details  Name: [name] MRN: [id number] Date of Birth: 08-Aug-1946 Referring Provider (OT): Dr. Alysia Penna (for follow-up)   Encounter Date: 08/19/2020   OT End of Session - 08/19/20 1138    Visit Number 3    Number of Visits 10    Date for OT Re-Evaluation 09/10/20    Authorization Type Medicare/AARP, Medicare guidelines    Authorization - Visit Number 3    Authorization - Number of Visits 10    Progress Note Due on Visit 10    OT Start Time 1018    OT Stop Time 1100    OT Time Calculation (min) 42 min    Activity Tolerance Patient tolerated treatment well    Behavior During Therapy [facility] for tasks assessed/performed           Past Medical History:  Diagnosis Date  . Abnormality of gait 09/07/2016  . Allergy   . Diabetes mellitus without complication [facility])    Patient denies this - notes history of glucose intolerance  . GERD (gastroesophageal reflux disease)   . Hemiparesis and alteration of sensations as late effects of stroke ([place]) 09/07/2016  . History of pneumonia 1997  . Hypertension   . S/P MVR (mitral valve replacement)    Mechanical mitral valve replacement at age 54 (done in [place])  // echo 7/17: EF 55-60, normal wall motion, bileaflet mechanical mitral valve prosthesis functioning normally, mild LAE, mildly reduced RVSF, small pericardial effusion  . Stroke [facility]) 1997, 2013, 2015    Past Surgical History:  Procedure Laterality Date  . ABDOMINAL HYSTERECTOMY    . BRAIN SURGERY    . BUNIONECTOMY  1993  . CARDIAC VALVE REPLACEMENT  1997  . TOE SURGERY  1996    There were no vitals filed for this visit.   Subjective Assessment - 08/19/20 1017    Subjective  Pt reports that she hasn't noticed any RUE changes, Pt reports incr spasticity of LUE and  weakness in LLE, decline in ambulation from recent CVA. I have a little pain in my Lt hip/upper leg since working with P.T.    Pertinent History acute CVA, small L frontal lobe possible infarct; hospitalized 07/01/2020 and transferred to inpatient rehab, discharged home 07/16/2020.   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 Yes    Pain Score 1     Pain Location Hip    Pain Orientation Left    Pain Descriptors / Indicators Sore    Pain Type Acute pain    Pain Onset More than a month ago    Pain Frequency Intermittent    Aggravating Factors  after working with P.T.    Pain Relieving Factors Bengay, tylenol, ice massage          Verbally reviewed coordination HEP. Reviewed HEP for neuro re-education:  Pt w/ decreased proprioception and reports she cannot tell if elbows are bent are straight unless looking at them. Pt w/ difficulty and cues to keep elbows straighter with BUE sh flexion in supine (even RUE). [name] same ex seated with greater success (possibly due  to seeing it better).  Attempted elbow ext against gravity in supine w/ 1 lb weight LUE - however spasticity and compensations at shoulder limited ability to perform correctly therefore d/c. Worked on chair push ups for tricep activation and wt bearing to management spasticity - pt required sup to perform, but this was more effective then open chain for elbow ext.  Seated: worked on Immunologist back and forth to therapist w/ focus on equal throw b/t hands. Also practiced tossing and catching bean bag in Lt hand. Standing: worked on bean bag toss w/ LUE for balance, natural swing LUE, releasing object, and gross motor coordination                           OT Long Term Goals - 08/12/20 1015       OT LONG TERM GOAL #1   Title Pt and husband will be independent with updated HEP to address LUE coordination, strength, and spasticity management-- check LTGs  09/09/20    Time 5    Period Weeks    Status On-going      OT LONG TERM GOAL #2   Title Pt will decr spasticity to be able to ambulate at least 30 feet with only minimal elbow flexion and no shoulder hike of LUE.    Time 5    Period Weeks    Status New      OT LONG TERM GOAL #3   Title Pt will improve LUE coordination as shown by improving time on 9-hole peg test by at least 8sec.    Baseline 66.63sec    Time 5    Period Weeks    Status New      OT LONG TERM GOAL #4   Title Pt will demo at least -25* L elbow ext with functional reach.    Baseline -35*    Time 5    Period Weeks    Status New      OT LONG TERM GOAL #5   Title Pt will improve functional reaching/coordination with LUE as shown by improving score on box and blocks test by at least 4 blocks.    Baseline L-27 blocks    Time 5    Period Weeks    Status New                 Plan - 08/19/20 1139    Clinical Impression Statement Pt with decreased proprioception and dystonia patterns w/ more challenging tasks    Occupational performance deficits (Please refer to evaluation for details): ADL's;IADL's;Leisure;Social Participation    Body Structure / Function / Physical Skills ADL;Balance;Mobility;Tone;Strength;UE functional use;ROM;IADL;Dexterity;Coordination;FMC    OT Frequency 2x / week    OT Duration --   5 weeks   OT Treatment/Interventions Self-care/ADL training;Therapeutic exercise;Functional Mobility Training;Balance training;Manual Therapy;Neuromuscular education;Ultrasound;Energy conservation;Therapeutic activities;DME and/or AE instruction;Electrical Stimulation;Patient/family education;Passive range of motion;Moist Heat;Cryotherapy    Plan issue additional HEP to include chair push ups (w/ supervision), toss bean bag Lt hand, toss volleyball size w/  both hands back and forth to family member, and folding clothes. Continue functional reaching and coordination LUE    Consulted and Agree with Plan of Care Patient;Family member/caregiver    Family Member Consulted husband           Patient will benefit from skilled therapeutic intervention in order to improve the following deficits and impairments:   Body Structure / Function / Physical Skills: ADL, Balance,  Mobility, Tone, Strength, UE functional use, ROM, IADL, Dexterity, Coordination, [facility]       Visit Diagnosis: Hemiplegia and hemiparesis following cerebral infarction affecting left non-dominant side ([facility])  Other lack of coordination  Muscle weakness (generalized)  Unsteadiness on feet    Problem List Patient Active Problem List   Diagnosis Date Noted  . Goals of care, counseling/discussion   . Palliative care by specialist   . Acute CVA (cerebrovascular accident) ([place]) 07/01/2020  . 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 Anticoagulated on warfarin   . Left hemiparesis ([place])   . History of CVA with residual deficit   . Thrombocytopenia ([place])   . Prediabetes   . CVA (cerebral vascular accident) ([place]) 09/03/2019  . Vertigo 09/24/2017  . Benign paroxysmal positional vertigo   . History of stroke   . Hyperlipemia 08/23/2017  . Encounter for therapeutic drug monitoring 08/08/2017  . Long term (current) use of anticoagulants [Z79.01] 07/12/2017  . Hemiparesis and alteration of sensations as late effects of stroke ([place]) 09/07/2016  . Abnormality of gait 09/07/2016  . HTN (hypertension) 04/13/2016  . H/O mitral valve replacement with mechanical valve 10/16/2015  . Diabetes mellitus without complication ([place])   . Hemiparesis (L sided - mild) due to old stroke [facility])     Carey Bullocks, OTR/L 08/19/2020, 11:47 AM  [facility] [address] [place], [place], [address] Phone: [phone]   Fax:   [phone]  Name: [name] MRN: [id number] Date of Birth: 01/19/1946

## 2020-08-21 ENCOUNTER — Other Ambulatory Visit: Payer: Self-pay

## 2020-08-21 ENCOUNTER — Ambulatory Visit: Payer: Medicare Other

## 2020-08-21 ENCOUNTER — Encounter: Payer: Medicare Other | Admitting: Occupational Therapy

## 2020-08-21 DIAGNOSIS — R2689 Other abnormalities of gait and mobility: Secondary | ICD-10-CM

## 2020-08-21 DIAGNOSIS — M6281 Muscle weakness (generalized): Secondary | ICD-10-CM

## 2020-08-21 DIAGNOSIS — R29818 Other symptoms and signs involving the nervous system: Secondary | ICD-10-CM | POA: Diagnosis not present

## 2020-08-21 DIAGNOSIS — R2681 Unsteadiness on feet: Secondary | ICD-10-CM | POA: Diagnosis not present

## 2020-08-21 DIAGNOSIS — I69354 Hemiplegia and hemiparesis following cerebral infarction affecting left non-dominant side: Secondary | ICD-10-CM | POA: Diagnosis not present

## 2020-08-21 DIAGNOSIS — R278 Other lack of coordination: Secondary | ICD-10-CM | POA: Diagnosis not present

## 2020-08-21 NOTE — Therapy (Signed)
[facility] [address] [place], [place], [address] Phone: [phone]   Fax:  [phone]  Physical Therapy Treatment  Patient Details  Name: [name] MRN: [id number] Date of Birth: [birth year] Referring Provider (PT): Marlowe Shores, PA-C (Dr. Percell Miller for follow-up)   Encounter Date: 08/21/2020   PT End of Session - 08/21/20 0847    Visit Number 9    Number of Visits 17    Date for PT Re-Evaluation 41/32/44   90 day cert for 8 wk POC   Authorization Type Medicare/AARP    PT Start Time 0846    PT Stop Time 0931    PT Time Calculation (min) 45 min    Equipment Utilized During Treatment Gait belt    Activity Tolerance Patient tolerated treatment well    Behavior During Therapy [facility] for tasks assessed/performed           Past Medical History:  Diagnosis Date  . Abnormality of gait 09/07/2016  . Allergy   . Diabetes mellitus without complication [facility])    Patient denies this - notes history of glucose intolerance  . GERD (gastroesophageal reflux disease)   . Hemiparesis and alteration of sensations as late effects of stroke ([place]) 09/07/2016  . History of pneumonia 1997  . Hypertension   . S/P MVR (mitral valve replacement)    Mechanical mitral valve replacement at age 89 (done in [place])  // echo 7/17: EF 55-60, normal wall motion, bileaflet mechanical mitral valve prosthesis functioning normally, mild LAE, mildly reduced RVSF, small pericardial effusion  . Stroke [facility]) 1997, 2013, 2015    Past Surgical History:  Procedure Laterality Date  . ABDOMINAL HYSTERECTOMY    . BRAIN SURGERY    . BUNIONECTOMY  1993  . CARDIAC VALVE REPLACEMENT  1997  . TOE SURGERY  1996    There were no vitals filed for this visit.   Subjective Assessment - 08/21/20 0850    Subjective Pt reports she is doing well.    Patient is accompained by: Family member   spouse, [name]   Patient Stated Goals Pt's goals  for therapy are to get back to where I was before-walking more independently with cane.    Currently in Pain? No/denies                             [facility] Adult PT Treatment/Exercise - 08/21/20 0851      Transfers   Transfers Sit to Stand;Stand to Sit    Sit to Stand 5: Supervision    Stand to Sit 5: Supervision      Ambulation/Gait   Ambulation/Gait Yes    Ambulation/Gait Assistance 5: Supervision;4: Min guard    Ambulation/Gait Assistance Details Pt was instructed to pick up feet more in grass. Pt slowed and less confident in grass. Pt reports a little throbbing in left lateral thigh towards end    Ambulation Distance (Feet) 850 Feet    Assistive device Straight cane   with left AFO   Gait Pattern Step-through pattern;Decreased stance time - left;Decreased hip/knee flexion - left    Ambulation Surface Level;Unlevel;Outdoor;Paved;Grass    Curb 4: Min assist    Curb Details (indicate cue type and reason) x 3 with SPC. Verbal cues for sequencing. Pt improved as went on CGA.      Neuro Re-ed    Neuro Re-ed Details  Dynamic gait activities in hallway: marching gait x 40'  with SPC with cues to take her time and control steps. Pt cued to tighten left glut during stance but states she can not tell if she is. Did not have pelvic drop. Backwards gait 40' x 1 with SPC, side stepping 40' x 1 each direction with SPC. Pt more challenged with stepping to the right with LLE trailing with less control. Sit to stand x 5 from mat with airex under feet min assist trying 3 without hands and then 2 with light right UE support. Pt was cued to really lean forward when rising and coming to sit. Leaning posterior initially and trouble controlling descent last couple inches.                    PT Short Term Goals - 08/19/20 7867      PT SHORT TERM GOAL #1   Title Pt will be independent with HEP for improved strength, balance, transfers, and gait.  TARGET 08/22/2020    Baseline  08/19/20: met with current program, will benefit from advancement as pt improves    Status Achieved      PT SHORT TERM GOAL #2   Title Pt will improve TUG score to less than or equal to 20 seconds for decreased fall risk.    Baseline 08/19/20: 16.1 sec's with cane    Time --    Period --    Status Achieved      PT SHORT TERM GOAL #3   Title Pt will improve BERG to at least 42/56 for decreased fall risk.    Baseline 08/19/20: 48/56 scored today    Time --    Period --    Status Achieved      PT SHORT TERM GOAL #4   Title Pt will improve 5x sit<>stand to less than or equal to 15 seconds for improved functional strength.    Baseline 08/19/20: 14.79 sec's no UE support from standard height surface    Time --    Period --    Status Achieved      PT SHORT TERM GOAL #5   Title Pt will ambulate at least 50 ft, indoor and household surfaces, no device, independently for improved household independence.    Baseline 08/19/20: met in session today    Time --    Period --    Status Achieved             PT Long Term Goals - 07/21/20 2001      PT LONG TERM GOAL #1   Title Pt will be independent with progression of HEP for improved strength, balance, transfers, and gait.  TARGET 09/19/2020    Time 8    Period Weeks    Status New      PT LONG TERM GOAL #2   Title Pt will improve TUG score to less than or equal to 15 seconds for decreased fall risk.    Time 8    Period Weeks    Status New      PT LONG TERM GOAL #3   Title Pt will improve gait velocity to at least 2 ft/sec for improved gait efficiency and safety and decreased fall risk.    Baseline 1.4 ft/sec at eval    Time 8    Period Weeks    Status New      PT LONG TERM GOAL #4   Title Pt will ambulate at least 500 ft, indoors/outdoors, modified independently for improved community gait.  Time 8    Period Weeks    Status New      PT LONG TERM GOAL #5   Title Pt will improve FOTO score at discharge by at least 10% to  demonstrate improved overall functional mobility.    Time 8    Period Weeks    Status New                 Plan - 08/21/20 1203    Clinical Impression Statement PT continued to progress gait and balance activities. Pt most challenged on compliant surfaces and with stepping activities. She does tend to get anxious at times with new task but improves as goes on.    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 need to work on LLE weightbearing, try step-ups, NMR and strengthening, focus on trying to get more glut med activation, balance, timing and coordination of gait    Consulted and Agree with Plan of Care Patient;Family member/caregiver    Family Member Consulted husband           Patient will benefit from skilled therapeutic intervention in order to improve the following deficits and impairments:  Abnormal gait, Difficulty walking, Decreased range of motion, Impaired tone, Pain, Decreased balance, Decreased mobility, Decreased strength, Postural dysfunction  Visit Diagnosis: Other abnormalities of gait and mobility  Muscle weakness (generalized)  Hemiplegia and hemiparesis following cerebral infarction affecting left non-dominant side [facility])     Problem List Patient Active Problem List   Diagnosis Date Noted  . Goals of care,  counseling/discussion   . Palliative care by specialist   . Acute CVA (cerebrovascular accident) ([place]) 07/01/2020  . 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 Anticoagulated on warfarin   . Left hemiparesis ([place])   . History of CVA with residual deficit   . Thrombocytopenia ([place])   . Prediabetes   . CVA (cerebral vascular accident) ([place]) 09/03/2019  . Vertigo 09/24/2017  . Benign paroxysmal positional vertigo   . History of stroke   . Hyperlipemia 08/23/2017  . Encounter for therapeutic drug monitoring 08/08/2017  . Long term (current) use of anticoagulants [Z79.01] 07/12/2017  . Hemiparesis and alteration of sensations as late effects of stroke ([place]) 09/07/2016  . Abnormality of gait 09/07/2016  . HTN (hypertension) 04/13/2016  . H/O mitral valve replacement with mechanical valve 10/16/2015  . Diabetes mellitus without complication ([place])   . Hemiparesis (L sided - mild) due to old stroke ([place])     Electa Sniff, PT, DPT, NCS 08/21/2020, 12:06 PM  [facility] [address] [place], [place], [address] Phone: [phone]   Fax:  [phone]  Name: [name] MRN: [id number] Date of Birth: 20-Aug-1946

## 2020-08-25 ENCOUNTER — Ambulatory Visit (INDEPENDENT_AMBULATORY_CARE_PROVIDER_SITE_OTHER): Payer: Medicare Other | Admitting: Pharmacist

## 2020-08-25 DIAGNOSIS — Z5181 Encounter for therapeutic drug level monitoring: Secondary | ICD-10-CM

## 2020-08-25 DIAGNOSIS — Z952 Presence of prosthetic heart valve: Secondary | ICD-10-CM | POA: Diagnosis not present

## 2020-08-25 DIAGNOSIS — I69359 Hemiplegia and hemiparesis following cerebral infarction affecting unspecified side: Secondary | ICD-10-CM | POA: Diagnosis not present

## 2020-08-25 DIAGNOSIS — Z7901 Long term (current) use of anticoagulants: Secondary | ICD-10-CM

## 2020-08-25 LAB — POCT INR: INR: 2.2 (ref 2.0–3.0)

## 2020-08-26 ENCOUNTER — Ambulatory Visit: Payer: Medicare Other

## 2020-08-26 ENCOUNTER — Encounter: Payer: Self-pay | Admitting: Occupational Therapy

## 2020-08-26 ENCOUNTER — Other Ambulatory Visit: Payer: Self-pay

## 2020-08-26 ENCOUNTER — Ambulatory Visit: Payer: Medicare Other | Admitting: Occupational Therapy

## 2020-08-26 DIAGNOSIS — R2689 Other abnormalities of gait and mobility: Secondary | ICD-10-CM

## 2020-08-26 DIAGNOSIS — R278 Other lack of coordination: Secondary | ICD-10-CM

## 2020-08-26 DIAGNOSIS — I69354 Hemiplegia and hemiparesis following cerebral infarction affecting left non-dominant side: Secondary | ICD-10-CM | POA: Diagnosis not present

## 2020-08-26 DIAGNOSIS — R2681 Unsteadiness on feet: Secondary | ICD-10-CM

## 2020-08-26 DIAGNOSIS — R29818 Other symptoms and signs involving the nervous system: Secondary | ICD-10-CM

## 2020-08-26 DIAGNOSIS — R29898 Other symptoms and signs involving the musculoskeletal system: Secondary | ICD-10-CM

## 2020-08-26 DIAGNOSIS — R482 Apraxia: Secondary | ICD-10-CM

## 2020-08-26 DIAGNOSIS — M6281 Muscle weakness (generalized): Secondary | ICD-10-CM

## 2020-08-26 DIAGNOSIS — R270 Ataxia, unspecified: Secondary | ICD-10-CM

## 2020-08-26 NOTE — Therapy (Signed)
[facility] [address] [place], [place], [address] Phone: [phone]   Fax:  [phone]  Physical Therapy Progress Note  Patient Details  Name: [name] MRN: [id number] Date of Birth: 30-Sep-1946 Referring Provider (PT): Deatra Ina, PA-C (Dr. Jacqlyn Larsen for follow-up)   Encounter Date: 08/26/2020   PT End of Session - 08/26/20 0951    Visit Number 10    Number of Visits 17    Date for PT Re-Evaluation 10/19/20   90 day cert for 8 wk POC   Authorization Type Medicare/AARP    PT Start Time 1015    PT Stop Time 1100    PT Time Calculation (min) 45 min    Equipment Utilized During Treatment Gait belt    Activity Tolerance Patient tolerated treatment well    Behavior During Therapy [facility] for tasks assessed/performed           Past Medical History:  Diagnosis Date  . Abnormality of gait 09/07/2016  . Allergy   . Diabetes mellitus without complication [facility])    Patient denies this - notes history of glucose intolerance  . GERD (gastroesophageal reflux disease)   . 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) 09/07/2016  . History of pneumonia 1997  . Hypertension   . S/P MVR (mitral valve replacement)    Mechanical mitral valve replacement at age 57 (done in [place])  // echo 7/17: EF 55-60, normal wall motion, bileaflet mechanical mitral valve prosthesis functioning normally, mild LAE, mildly reduced RVSF, small pericardial effusion  . Stroke [facility]) 1997, 2013, 2015    Past Surgical History:  Procedure Laterality Date  . ABDOMINAL HYSTERECTOMY    . BRAIN SURGERY    . BUNIONECTOMY  1993  . CARDIAC VALVE REPLACEMENT  1997  . TOE SURGERY  1996    There were no vitals filed for this visit.   Subjective Assessment - 08/26/20 1115    Subjective Pt reports that she is doing well. She has a flight of stairs to spare bedroom but she is afraid to go up those steps  because she they are too steep. She is more afraid to comd down then going up the steps. They have bil hand rails.    Patient is accompained by: Family member   spouse, Nadine Counts   Patient Stated Goals Pt's goals for therapy are to get back to where I was before-walking more independently with cane.    Currently in Pain? No/denies    Pain Frequency Occasional              [facility] PT Assessment - 08/26/20 0001      Berg Balance Test   Sit to Stand Able to stand without using hands and stabilize independently    Standing Unsupported Able to stand safely 2 minutes    Sitting with Back Unsupported but Feet Supported on Floor or Stool Able to sit safely and securely 2 minutes    Stand to Sit Sits safely with minimal use of hands    Transfers Able to transfer safely, minor use of hands    Standing Unsupported with Eyes Closed Able to stand 10 seconds safely    Standing Unsupported with Feet Together Able to place feet together independently and stand 1 minute safely    From Standing, Reach Forward with Outstretched Arm Can reach confidently >25 cm (10")    From Standing Position, Pick up Object from Floor Able to pick up  shoe safely and easily    From Standing Position, Turn to Look Behind Over each Shoulder Looks behind from both sides and weight shifts well    Turn 360 Degrees Able to turn 360 degrees safely in 4 seconds or less    Standing Unsupported, Alternately Place Feet on Step/Stool Able to stand independently and safely and complete 8 steps in 20 seconds    Standing Unsupported, One Foot in Front Able to plae foot ahead of the other independently and hold 30 seconds    Standing on One Leg Able to lift leg independently and hold > 10 seconds   R LE= 30 sec, L LE = 3 sec   Total Score 55    Berg comment: 55/56 today      Timed Up and Go Test   Normal TUG (seconds) 15.3   st cane   Manual TUG (seconds) 14.19   st. cane   TUG Comments 14.75 sec (average)   st. cane               Berg balance scale, Timed up and go test and gait velocity performed with patient Gait training: 1 x 207' with st. Cane, pt has "shake" in distal LE during swing phase with wider BOS, trial of looking down at feet when walking, no significant improvement noted. Pt cued to make sure she looks down when walking on non compliant surfaces or walking around obstacles  Stair training: 2 x 4 steps with bil hand rails, cues to go up with R LE and come down with L LE with CGA/SBA. 1 x 4 steps with patient coming down backwards with L LE first and bil hand rail with CGA. Pt and husband both educated on trying 4-5 steps at home to practice and as she gets more comfortable, she can begin to practice more steps until she can get to top. Pt and husband educated on leaving one cane at top of the steps on her right so she can use it when she gets to the top. One cane on bottom of the steps. Husband educated on being behind her when she is going up and being in front of her when she is coming down facing fwd for safety.                    PT Short Term Goals - 08/26/20 0950      PT SHORT TERM GOAL #1   Title Pt will be independent with HEP for improved strength, balance, transfers, and gait.  TARGET 08/22/2020    Baseline 08/19/20: met with current program, will benefit from advancement as pt improves    Status Achieved      PT SHORT TERM GOAL #2   Title Pt will improve TUG score to less than or equal to 20 seconds for decreased fall risk.    Baseline 08/19/20: 16.1 sec's with cane    Status Achieved      PT SHORT TERM GOAL #3   Title Pt will improve BERG to at least 42/56 for decreased fall risk.    Baseline 08/19/20: 48/56 scored today    Status Achieved      PT SHORT TERM GOAL #4   Title Pt will improve 5x sit<>stand to less than or equal to 15 seconds for improved functional strength.    Baseline 08/19/20: 14.79 sec's no UE support from standard height surface    Status  Achieved      PT SHORT TERM  GOAL #5   Title Pt will ambulate at least 50 ft, indoor and household surfaces, no device, independently for improved household independence.    Baseline 08/19/20: met in session today    Status Achieved             PT Long Term Goals - 08/26/20 0950      PT LONG TERM GOAL #1   Title Pt will be independent with progression of HEP for improved strength, balance, transfers, and gait.  TARGET 09/19/2020    Time 8    Period Weeks    Status New      PT LONG TERM GOAL #2   Title Pt will improve TUG score to less than or equal to 15 seconds for decreased fall risk.    Baseline 14.75 sec with st. cane (08/26/20)    Time 8    Period Weeks    Status Achieved      PT LONG TERM GOAL #3   Title Pt will improve gait velocity to at least 2 ft/sec for improved gait efficiency and safety and decreased fall risk.    Baseline 1.4 ft/sec at eval; 1.25 ft/sec with st. cane 08/26/20    Time 8    Period Weeks    Status On-going      PT LONG TERM GOAL #4   Title Pt will ambulate at least 500 ft, indoors/outdoors, modified independently for improved community gait.    Time 8    Period Weeks    Status New      PT LONG TERM GOAL #5   Title Pt will improve FOTO score at discharge by at least 10% to demonstrate improved overall functional mobility.    Time 8    Period Weeks    Status New                 Plan - 08/26/20 0950    Clinical Impression Statement Patient has been seen for total of 10 sessions from 07/21/20 to 08/26/20. Patient has currently met all of her short term goals and is making good progress towards her long term goals. Patient has met 1/5 Long term goals. patient scored 55/56 on Berg balance scale which puts her at low fall risk. Patient continues to have increased challanged with single leg stance activities on L LE, static and dynamic balance activities on non copliant surface and without visual feedback due to decreased proprioception.  Patient also demonstrates decreased gait velocity which puts her in the household ambulator category. Patient will continue to benefit from skilled PT to address her gait and balance impairments to work towards her long term functional goals.    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    Examination-Activity Limitations Locomotion Level;Transfers;Stairs;Stand    Examination-Participation Restrictions Community Activity;Other    Rehab Potential Good    PT Frequency 2x / week    PT Duration 8 weeks    PT Treatment/Interventions ADLs/Self Care Home Management;DME Instruction;Gait training;Stair training;Functional mobility training;Therapeutic activities;Therapeutic exercise;Balance training;Neuromuscular re-education;Patient/family education;Passive range of motion;Orthotic Fit/Training    PT Next Visit Plan Work on stairs (step to pattern) with bil rails, SLS on L LE, try having ankle weight on L LE with gait to see if he reduces "shaking" of L LE during swing phase    Consulted and Agree with Plan of Care Patient;Family member/caregiver    Family Member Consulted husband  Patient will benefit from skilled therapeutic intervention in order to improve the following deficits and impairments:  Abnormal gait, Difficulty walking, Decreased range of motion, Impaired tone, Pain, Decreased balance, Decreased mobility, Decreased strength, Postural dysfunction  Visit Diagnosis: Muscle weakness (generalized)  Unsteadiness on feet  Other abnormalities of gait and mobility     Problem List Patient Active Problem List   Diagnosis Date Noted  . Goals of care, counseling/discussion   . Palliative care by specialist   . Acute CVA (cerebrovascular accident) ([place]) 07/01/2020  . 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 Anticoagulated on warfarin   . Left hemiparesis ([place])   . History of CVA  with residual deficit   . Thrombocytopenia ([place])   . Prediabetes   . CVA (cerebral vascular accident) ([place]) 09/03/2019  . Vertigo 09/24/2017  . Benign paroxysmal positional vertigo   . History of stroke   . Hyperlipemia 08/23/2017  . Encounter for therapeutic drug monitoring 08/08/2017  . Long term (current) use of anticoagulants [Z79.01] 07/12/2017  . Hemiparesis and alteration of sensations as late effects of stroke ([place]) 09/07/2016  . Abnormality of gait 09/07/2016  . HTN (hypertension) 04/13/2016  . H/O mitral valve replacement with mechanical valve 10/16/2015  . Diabetes mellitus without complication ([place])   . Hemiparesis (L sided - mild) due to old stroke ([place])     Kerrie Pleasure, PT 08/26/2020, 11:16 AM  [facility] [address] [place], [place], [address] Phone: [phone]   Fax:  [phone]  Name: GREG ECKRICH MRN: [id number] Date of Birth: 1946/07/23

## 2020-08-26 NOTE — Patient Instructions (Signed)
    ROM: Abduction - Wand   Holding wand with left hand palm up, push wand directly out to side, leading with other hand palm down, until stretch is felt. Hold 5 seconds. Repeat 10 times per set. Do 2-3 sessions per day.  Flexion (Eccentric) - Active-Assist (Cane)    Use unaffected arm to push affected arm forward. Avoid hiking shoulder. Keep palm relaxed. Slowly lower affected arm for 3-5 seconds, increasing use of affected arm. 10 reps per set, 2 sets per day.      STANDING: Weight on Hands    Rest palms on table . Place left hand on shelf liner.  Keep elbow straight.  Use right hand to pick up coins and reach out to put them in container (so that you are putting weight through left arm/hand).  1x/eay

## 2020-08-26 NOTE — Therapy (Signed)
[facility] [address] [place], [place], [address] Phone: [phone]   Fax:  [phone]  Occupational Therapy Treatment  Patient Details  Name: [name] MRN: [id number] Date of Birth: [birth year] Referring Provider (OT): Dr. Alysia Penna (for follow-up)   Encounter Date: 08/26/2020   OT End of Session - 08/26/20 0949    Visit Number 4    Number of Visits 10    Date for OT Re-Evaluation 09/10/20    Authorization Type Medicare/AARP, Medicare guidelines    Authorization - Visit Number 4    Authorization - Number of Visits 10    Progress Note Due on Visit 10    OT Start Time [phone]    OT Stop Time 1015    OT Time Calculation (min) 38 min    Activity Tolerance Patient tolerated treatment well    Behavior During Therapy [facility] for tasks assessed/performed           Past Medical History:  Diagnosis Date  . Abnormality of gait 09/07/2016  . Allergy   . Diabetes mellitus without complication [facility])    Patient denies this - notes history of glucose intolerance  . GERD (gastroesophageal reflux disease)   . Hemiparesis and alteration of sensations as late effects of stroke ([place]) 09/07/2016  . History of pneumonia 1997  . Hypertension   . S/P MVR (mitral valve replacement)    Mechanical mitral valve replacement at age 10 (done in [place])  // echo 7/17: EF 55-60, normal wall motion, bileaflet mechanical mitral valve prosthesis functioning normally, mild LAE, mildly reduced RVSF, small pericardial effusion  . Stroke [facility]) 1997, 2013, 2015    Past Surgical History:  Procedure Laterality Date  . ABDOMINAL HYSTERECTOMY    . BRAIN SURGERY    . BUNIONECTOMY  1993  . CARDIAC VALVE REPLACEMENT  1997  . TOE SURGERY  1996    There were no vitals filed for this visit.   Subjective Assessment - 08/26/20 0939    Subjective  Pt reports LUE is up and down    Pertinent History acute CVA, small L frontal lobe possible  infarct; hospitalized 07/01/2020 and transferred to inpatient rehab, discharged home 07/16/2020.   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 Yes    Pain Score 1     Pain Location Foot    Pain Orientation Left    Pain Descriptors / Indicators Sore    Pain Type Chronic pain    Pain Onset More than a month ago    Pain Frequency Intermittent    Aggravating Factors  swelling    Pain Relieving Factors rest flat             Attempted wall slides with RUE assisting LUE but too difficult and compensation patterns.  Standing, UE ranger for higher range shoulder flex with min-mod cueing/facilitation for normal movement patterns/decr compensation.  Standing at table, performed wt. Bearing through both hands with forward/backward wt. Shifts with min-mod cueing, CGA.  Sitting, functional reaching at mid-level to place washers on vertical pole.  Standing, tossing beanbags to target with LUE with min-mod difficulty for incr coordination and normal movement patterns.   Ambulating with min v.c. to relax LUE and place LUE on LLE  to help with proprioception as pt can feel L hand on leg.     OT Education - 08/26/20 1011    Education Details Additions to HEP--see pt instructions    Person(s) Educated Patient;Spouse    Methods Explanation;Demonstration;Handout;Verbal cues    Comprehension Verbalized understanding;Returned demonstration;Verbal cues required               OT Long Term Goals - 08/12/20 1015      OT LONG TERM GOAL #1   Title Pt and husband will be independent with updated HEP to address LUE coordination, strength, and spasticity management-- check LTGs  09/09/20    Time 5    Period Weeks    Status On-going      OT LONG TERM GOAL #2   Title Pt will decr spasticity to be able to  ambulate at least 30 feet with only minimal elbow flexion and no shoulder hike of LUE.    Time 5    Period Weeks    Status New      OT LONG TERM GOAL #3   Title Pt will improve LUE coordination as shown by improving time on 9-hole peg test by at least 8sec.    Baseline 66.63sec    Time 5    Period Weeks    Status New      OT LONG TERM GOAL #4   Title Pt will demo at least -25* L elbow ext with functional reach.    Baseline -35*    Time 5    Period Weeks    Status New      OT LONG TERM GOAL #5   Title Pt will improve functional reaching/coordination with LUE as shown by improving score on box and blocks test by at least 4 blocks.    Baseline L-27 blocks    Time 5    Period Weeks    Status New                 Plan - 08/26/20 0950    Clinical Impression Statement Pt with decreased proprioception and dystonia patterns with more challenging tasks.  However, pt responded well to weight bearing tasks.    Occupational performance deficits (Please refer to evaluation for details): ADL's;IADL's;Leisure;Social Participation    Body Structure / Function / Physical Skills ADL;Balance;Mobility;Tone;Strength;UE functional use;ROM;IADL;Dexterity;Coordination;FMC    OT Frequency 2x / week    OT Duration --   5 weeks   OT Treatment/Interventions Self-care/ADL training;Therapeutic exercise;Functional Mobility Training;Balance training;Manual Therapy;Neuromuscular education;Ultrasound;Energy conservation;Therapeutic activities;DME and/or AE instruction;Electrical Stimulation;Patient/family education;Passive range of motion;Moist Heat;Cryotherapy    Plan ?Additions to HEP to include:  toss medium ball with both hands back and forth to family member, toss beanbag/ball with L hand to target and folding clothes. Continue functional reaching and coordination LUE    Consulted and Agree with Plan of Care Patient;Family member/caregiver    Family Member Consulted husband           Patient will  benefit from skilled therapeutic intervention in order to improve the following deficits and impairments:   Body Structure / Function / Physical Skills: ADL, Balance, Mobility, Tone, Strength, UE functional use, ROM, IADL, Dexterity, Coordination, [facility]       Visit Diagnosis: Hemiplegia and hemiparesis following cerebral infarction affecting left non-dominant side (HCC)  Other lack of coordination  Other symptoms and signs involving the nervous system  Other symptoms and signs involving the musculoskeletal system  Unsteadiness on feet  Ataxia  Apraxia  Problem List Patient Active Problem List   Diagnosis Date Noted  . Goals of care, counseling/discussion   . Palliative care by specialist   . Acute CVA (cerebrovascular accident) ([place]) 07/01/2020  . 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 Anticoagulated on warfarin   . Left hemiparesis ([place])   . History of CVA with residual deficit   . Thrombocytopenia ([place])   . Prediabetes   . CVA (cerebral vascular accident) ([place]) 09/03/2019  . Vertigo 09/24/2017  . Benign paroxysmal positional vertigo   . History of stroke   . Hyperlipemia 08/23/2017  . Encounter for therapeutic drug monitoring 08/08/2017  . Long term (current) use of anticoagulants [Z79.01] 07/12/2017  . Hemiparesis and alteration of sensations as late effects of stroke ([place]) 09/07/2016  . Abnormality of gait 09/07/2016  . HTN (hypertension) 04/13/2016  . H/O mitral valve replacement with mechanical valve 10/16/2015  . Diabetes mellitus without complication ([place])   . Hemiparesis (L sided - mild) due to old stroke [facility])     [facility] 08/26/2020, 3:55 PM  [facility] [address] [place], [place], [address] Phone: [phone]   Fax:  [phone]  Name: [name] MRN: [id number] Date of Birth: 1946-04-19   Vianne Bulls, OTR/L [facility] [address]  [place], [place]  [address] [phone] phone [phone] 08/26/20 3:59 PM

## 2020-08-28 ENCOUNTER — Ambulatory Visit: Payer: Medicare Other | Admitting: Occupational Therapy

## 2020-08-28 ENCOUNTER — Ambulatory Visit: Payer: Medicare Other | Admitting: Physical Therapy

## 2020-08-28 ENCOUNTER — Encounter: Payer: Self-pay | Admitting: Occupational Therapy

## 2020-08-28 ENCOUNTER — Encounter: Payer: Self-pay | Admitting: Physical Therapy

## 2020-08-28 ENCOUNTER — Other Ambulatory Visit: Payer: Self-pay

## 2020-08-28 DIAGNOSIS — R2689 Other abnormalities of gait and mobility: Secondary | ICD-10-CM

## 2020-08-28 DIAGNOSIS — R482 Apraxia: Secondary | ICD-10-CM

## 2020-08-28 DIAGNOSIS — R278 Other lack of coordination: Secondary | ICD-10-CM | POA: Diagnosis not present

## 2020-08-28 DIAGNOSIS — R29818 Other symptoms and signs involving the nervous system: Secondary | ICD-10-CM

## 2020-08-28 DIAGNOSIS — R2681 Unsteadiness on feet: Secondary | ICD-10-CM | POA: Diagnosis not present

## 2020-08-28 DIAGNOSIS — R270 Ataxia, unspecified: Secondary | ICD-10-CM

## 2020-08-28 DIAGNOSIS — M6281 Muscle weakness (generalized): Secondary | ICD-10-CM

## 2020-08-28 DIAGNOSIS — I69354 Hemiplegia and hemiparesis following cerebral infarction affecting left non-dominant side: Secondary | ICD-10-CM

## 2020-08-28 DIAGNOSIS — R29898 Other symptoms and signs involving the musculoskeletal system: Secondary | ICD-10-CM

## 2020-08-28 NOTE — Patient Instructions (Addendum)
    1.  Toss and catch volleyball sized ball with both hands.  2.  Toss beanbag or small ball into container with left hand.  3.  Fold clothes, put empty hangers on rack

## 2020-08-28 NOTE — Therapy (Signed)
[facility] [address] [place], [place], [address] Phone: [phone]   Fax:  [phone]  Occupational Therapy Treatment  Patient Details  Name: [name] MRN: [id number] Date of Birth: 1946-10-01 Referring Provider (OT): Dr. Alysia Penna (for follow-up)   Encounter Date: 08/28/2020   OT End of Session - 08/28/20 0938    Visit Number 5    Number of Visits 10    Date for OT Re-Evaluation 09/10/20    Authorization Type Medicare/AARP, Medicare guidelines    Authorization - Visit Number 5    Authorization - Number of Visits 10    Progress Note Due on Visit 10    OT Start Time [phone]    OT Stop Time 1015    OT Time Calculation (min) 39 min    Activity Tolerance Patient tolerated treatment well    Behavior During Therapy [facility] for tasks assessed/performed           Past Medical History:  Diagnosis Date  . Abnormality of gait 09/07/2016  . Allergy   . Diabetes mellitus without complication [facility])    Patient denies this - notes history of glucose intolerance  . GERD (gastroesophageal reflux disease)   . Hemiparesis and alteration of sensations as late effects of stroke ([place]) 09/07/2016  . History of pneumonia 1997  . Hypertension   . S/P MVR (mitral valve replacement)    Mechanical mitral valve replacement at age 73 (done in [place])  // echo 7/17: EF 55-60, normal wall motion, bileaflet mechanical mitral valve prosthesis functioning normally, mild LAE, mildly reduced RVSF, small pericardial effusion  . Stroke [facility]) 1997, 2013, 2015    Past Surgical History:  Procedure Laterality Date  . ABDOMINAL HYSTERECTOMY    . BRAIN SURGERY    . BUNIONECTOMY  1993  . CARDIAC VALVE REPLACEMENT  1997  . TOE SURGERY  1996    There were no vitals filed for this visit.   Subjective Assessment - 08/28/20 0938    Subjective  "This is the first time that I've been in therapy that I feel like we are addressing what's  really going on, all the other times was focused on strength"    Pertinent History acute CVA, small L frontal lobe possible infarct; hospitalized 07/01/2020 and transferred to inpatient rehab, discharged home 07/16/2020.   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 Yes    Pain Score 1     Pain Location Hip    Pain Orientation Left    Pain Descriptors / Indicators Sore    Pain Type Chronic pain    Pain Onset More than a month ago    Pain Frequency Intermittent    Aggravating Factors  nothing    Pain Relieving Factors Tylenol, [place]             Sitting, closed-chain shoulder flex with BUEs with ball with min cueing.  Reviewed HEP issued last session (cane ex for shoulder flex and abduction stretch, weight bearing .  Pt returned demo with min cueing.  Arm bike x5 min level 1 for reciprocal movement.  Pt cued to move slow and focus on extending LUE.  Picking up coins and placing in coin bank with min cueing/placement to  encourage elbow extension for spasticity management.       OT Education - 08/28/20 1013    Education Details Coordination/LUE functional use HEP--see pt instructions    Person(s) Educated Patient;Spouse    Methods Explanation;Demonstration;Handout;Verbal cues    Comprehension Verbalized understanding;Returned demonstration;Verbal cues required               OT Long Term Goals - 08/12/20 1015      OT LONG TERM GOAL #1   Title Pt and husband will be independent with updated HEP to address LUE coordination, strength, and spasticity management-- check LTGs  09/09/20    Time 5    Period Weeks    Status On-going      OT LONG TERM GOAL #2   Title Pt will decr spasticity to be able to ambulate at least 30 feet with only minimal elbow flexion and no shoulder hike of  LUE.    Time 5    Period Weeks    Status New      OT LONG TERM GOAL #3   Title Pt will improve LUE coordination as shown by improving time on 9-hole peg test by at least 8sec.    Baseline 66.63sec    Time 5    Period Weeks    Status New      OT LONG TERM GOAL #4   Title Pt will demo at least -25* L elbow ext with functional reach.    Baseline -35*    Time 5    Period Weeks    Status New      OT LONG TERM GOAL #5   Title Pt will improve functional reaching/coordination with LUE as shown by improving score on box and blocks test by at least 4 blocks.    Baseline L-27 blocks    Time 5    Period Weeks    Status New                 Plan - 08/28/20 0941    Clinical Impression Statement Pt progressing slowly with improving LUE functional use and coordination and responds well to spasticity management techniques.  Pt reports desire to continue OT.    Occupational performance deficits (Please refer to evaluation for details): ADL's;IADL's;Leisure;Social Participation    Body Structure / Function / Physical Skills ADL;Balance;Mobility;Tone;Strength;UE functional use;ROM;IADL;Dexterity;Coordination;FMC    OT Frequency 2x / week    OT Duration --   5 weeks   OT Treatment/Interventions Self-care/ADL training;Therapeutic exercise;Functional Mobility Training;Balance training;Manual Therapy;Neuromuscular education;Ultrasound;Energy conservation;Therapeutic activities;DME and/or AE instruction;Electrical Stimulation;Patient/family education;Passive range of motion;Moist Heat;Cryotherapy    Plan Continue functional reaching and coordination LUE emphasizing normal movement patterns, wt. bearing LUE, forward/back weight shifts with arm swing, resisted tricep ext?    Consulted and Agree with Plan of Care Patient;Family member/caregiver    Family Member Consulted husband           Patient will benefit from skilled therapeutic intervention in order to improve the following deficits and  impairments:   Body Structure / Function / Physical Skills: ADL, Balance, Mobility, Tone, Strength, UE functional use, ROM, IADL, Dexterity, Coordination, [facility]       Visit Diagnosis: Hemiplegia and hemiparesis following cerebral infarction affecting left non-dominant side (HCC)  Other lack of coordination  Other symptoms and signs involving the nervous system  Other symptoms and signs involving the musculoskeletal system  Unsteadiness on feet  Ataxia  Apraxia    Problem List Patient Active Problem List   Diagnosis Date Noted  .  Goals of care, counseling/discussion   . Palliative care by specialist   . Acute CVA (cerebrovascular accident) ([place]) 07/01/2020  . 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 Anticoagulated on warfarin   . Left hemiparesis ([place])   . History of CVA with residual deficit   . Thrombocytopenia ([place])   . Prediabetes   . CVA (cerebral vascular accident) ([place]) 09/03/2019  . Vertigo 09/24/2017  . Benign paroxysmal positional vertigo   . History of stroke   . Hyperlipemia 08/23/2017  . Encounter for therapeutic drug monitoring 08/08/2017  . Long term (current) use of anticoagulants [Z79.01] 07/12/2017  . Hemiparesis and alteration of sensations as late effects of stroke ([place]) 09/07/2016  . Abnormality of gait 09/07/2016  . HTN (hypertension) 04/13/2016  . H/O mitral valve replacement with mechanical valve 10/16/2015  . Diabetes mellitus without complication ([place])   . Hemiparesis (L sided - mild) due to old stroke [facility])     [facility] 08/28/2020, 2:23 PM  [facility] [address] [place], [place], [address] Phone: [phone]   Fax:  [phone]  Name: [name] MRN: [id number] Date of Birth: 1946/09/14   Vianne Bulls, OTR/L [facility] [address] [place], [place]  [address] [phone] phone [phone] 08/28/20 2:23 PM

## 2020-08-29 NOTE — Therapy (Signed)
[facility] [address] [place], [place], [address] Phone: [phone]   Fax:  [phone]  Physical Therapy Treatment  Patient Details  Name: [name] MRN: [id number] Date of Birth: October 01, 1946 Referring Provider (PT): Marlowe Shores, PA-C (Dr. Percell Miller for follow-up)   Encounter Date: 08/28/2020   PT End of Session - 08/28/20 1027    Visit Number 11    Number of Visits 17    Date for PT Re-Evaluation 31/49/74   90 day cert for 8 wk POC   Authorization Type Medicare/AARP    PT Start Time 1020    PT Stop Time 1100    PT Time Calculation (min) 40 min    Equipment Utilized During Treatment Gait belt    Activity Tolerance Patient tolerated treatment well    Behavior During Therapy [facility] for tasks assessed/performed           Past Medical History:  Diagnosis Date  . Abnormality of gait 09/07/2016  . Allergy   . Diabetes mellitus without complication [facility])    Patient denies this - notes history of glucose intolerance  . GERD (gastroesophageal reflux disease)   . Hemiparesis and alteration of sensations as late effects of stroke ([place]) 09/07/2016  . History of pneumonia 1997  . Hypertension   . S/P MVR (mitral valve replacement)    Mechanical mitral valve replacement at age 74 (done in [place])  // echo 7/17: EF 55-60, normal wall motion, bileaflet mechanical mitral valve prosthesis functioning normally, mild LAE, mildly reduced RVSF, small pericardial effusion  . Stroke [facility]) 1997, 2013, 2015    Past Surgical History:  Procedure Laterality Date  . ABDOMINAL HYSTERECTOMY    . BRAIN SURGERY    . BUNIONECTOMY  1993  . CARDIAC VALVE REPLACEMENT  1997  . TOE SURGERY  1996    There were no vitals filed for this visit.   Subjective Assessment - 08/28/20 1025    Subjective No new complaints. No falls to report.    Patient is accompained by: Family member   spouse, [name]   Patient Stated Goals  Pt's goals for therapy are to get back to where I was before-walking more independently with cane.    Currently in Pain? Yes    Pain Score 3     Pain Location Hip    Pain Orientation Left    Pain Descriptors / Indicators Sore    Pain Type Chronic pain    Pain Onset More than a month ago    Pain Frequency Intermittent    Aggravating Factors  unsure    Pain Relieving Factors Tylenol, Ephraim Hamburger                [facility] Adult PT Treatment/Exercise - 08/28/20 1028      Transfers   Transfers Sit to Stand;Stand to Sit    Sit to Stand 5: Supervision    Stand to Sit 5: Supervision      Ambulation/Gait   Ambulation/Gait Yes    Ambulation/Gait Assistance 5: Supervision;4: Min guard    Ambulation/Gait Assistance Details min guard assist on grass with cues for increased hip/knee flexion to clear left foot    Ambulation Distance (Feet) 250 Feet   x1, 600 x1 in/outdoors   Assistive device Straight cane;Other (Comment)   left AFO   Gait Pattern Step-through pattern;Decreased stance time - left;Decreased hip/knee flexion - left    Ambulation Surface Level;Outdoor;Unlevel;Indoor;Paved;Grass      Neuro Re-ed  Neuro Re-ed Details  for balance/muscle re-ed: performed both ways on balance board- rocking the board with EC with up to min assist,. then holding the board steady: alternating hand raises, bil hand raises for ~10 reps each with min guard to min assist for balance. then holding the board steady for EC 30 sec's x3 reps, then EC head movements left<>right, up<>down for ~10 reps each with up to min assist for balance.                 PT Short Term Goals - 08/26/20 0950      PT SHORT TERM GOAL #1   Title Pt will be independent with HEP for improved strength, balance, transfers, and gait.  TARGET 08/22/2020    Baseline 08/19/20: met with current program, will benefit from advancement as pt improves    Status Achieved      PT SHORT TERM GOAL #2   Title Pt will improve TUG score to less  than or equal to 20 seconds for decreased fall risk.    Baseline 08/19/20: 16.1 sec's with cane    Status Achieved      PT SHORT TERM GOAL #3   Title Pt will improve BERG to at least 42/56 for decreased fall risk.    Baseline 08/19/20: 48/56 scored today    Status Achieved      PT SHORT TERM GOAL #4   Title Pt will improve 5x sit<>stand to less than or equal to 15 seconds for improved functional strength.    Baseline 08/19/20: 14.79 sec's no UE support from standard height surface    Status Achieved      PT SHORT TERM GOAL #5   Title Pt will ambulate at least 50 ft, indoor and household surfaces, no device, independently for improved household independence.    Baseline 08/19/20: met in session today    Status Achieved             PT Long Term Goals - 08/26/20 0950      PT LONG TERM GOAL #1   Title Pt will be independent with progression of HEP for improved strength, balance, transfers, and gait.  TARGET 09/19/2020    Time 8    Period Weeks    Status New      PT LONG TERM GOAL #2   Title Pt will improve TUG score to less than or equal to 15 seconds for decreased fall risk.    Baseline 14.75 sec with st. cane (08/26/20)    Time 8    Period Weeks    Status Achieved      PT LONG TERM GOAL #3   Title Pt will improve gait velocity to at least 2 ft/sec for improved gait efficiency and safety and decreased fall risk.    Baseline 1.4 ft/sec at eval; 1.25 ft/sec with st. cane 08/26/20    Time 8    Period Weeks    Status On-going      PT LONG TERM GOAL #4   Title Pt will ambulate at least 500 ft, indoors/outdoors, modified independently for improved community gait.    Time 8    Period Weeks    Status New      PT LONG TERM GOAL #5   Title Pt will improve FOTO score at discharge by at least 10% to demonstrate improved overall functional mobility.    Time 8    Period Weeks    Status New  Plan - 08/28/20 1027    Clinical Impression Statement Today's  skilled session continued to focus on gait on various surfaces and balance training with rest breaks taken as needed. No other issues noted or reported in session. The pt is making progress toward goals and should benefit from continued PT to progress toward unmet LTGs.    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    Examination-Activity Limitations Locomotion Level;Transfers;Stairs;Stand    Examination-Participation Restrictions Community Activity;Other    Rehab Potential Good    PT Frequency 2x / week    PT Duration 8 weeks    PT Treatment/Interventions ADLs/Self Care Home Management;DME Instruction;Gait training;Stair training;Functional mobility training;Therapeutic activities;Therapeutic exercise;Balance training;Neuromuscular re-education;Patient/family education;Passive range of motion;Orthotic Fit/Training    PT Next Visit Plan Work on stairs (step to pattern) with bil rails, SLS on L LE, continue to work on Agricultural consultant and Agree with Plan of Care Patient;Family member/caregiver    Family Member Consulted husband           Patient will benefit from skilled therapeutic intervention in order to improve the following deficits and impairments:  Abnormal gait, Difficulty walking, Decreased range of motion, Impaired tone, Pain, Decreased balance, Decreased mobility, Decreased strength, Postural dysfunction  Visit Diagnosis: Other abnormalities of gait and mobility  Muscle weakness (generalized)  Hemiplegia and hemiparesis following cerebral infarction affecting left non-dominant side [facility])     Problem List Patient Active Problem List   Diagnosis Date Noted  . Goals of care, counseling/discussion   . Palliative care by specialist   . Acute CVA (cerebrovascular accident) ([place]) 07/01/2020  . 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 Anticoagulated on warfarin   . Left  hemiparesis ([place])   . History of CVA with residual deficit   . Thrombocytopenia ([place])   . Prediabetes   . CVA (cerebral vascular accident) ([place]) 09/03/2019  . Vertigo 09/24/2017  . Benign paroxysmal positional vertigo   . History of stroke   . Hyperlipemia 08/23/2017  . Encounter for therapeutic drug monitoring 08/08/2017  . Long term (current) use of anticoagulants [Z79.01] 07/12/2017  . Hemiparesis and alteration of sensations as late effects of stroke ([place]) 09/07/2016  . Abnormality of gait 09/07/2016  . HTN (hypertension) 04/13/2016  . H/O mitral valve replacement with mechanical valve 10/16/2015  . Diabetes mellitus without complication ([place])   . Hemiparesis (L sided - mild) due to old stroke ([place])     Willow Ora, PTA, [facility] [address], [place], [place] [address] [phone] 08/29/20, 10:16 AM   Name: AVIGAIL PILLING MRN: [id number] Date of Birth: Sep 14, 1946

## 2020-09-01 ENCOUNTER — Ambulatory Visit (INDEPENDENT_AMBULATORY_CARE_PROVIDER_SITE_OTHER): Payer: Medicare Other | Admitting: Pharmacist

## 2020-09-01 ENCOUNTER — Telehealth: Payer: Self-pay | Admitting: Cardiovascular Disease

## 2020-09-01 DIAGNOSIS — I69359 Hemiplegia and hemiparesis following cerebral infarction affecting unspecified side: Secondary | ICD-10-CM

## 2020-09-01 DIAGNOSIS — Z952 Presence of prosthetic heart valve: Secondary | ICD-10-CM | POA: Diagnosis not present

## 2020-09-01 DIAGNOSIS — Z7901 Long term (current) use of anticoagulants: Secondary | ICD-10-CM

## 2020-09-01 DIAGNOSIS — Z5181 Encounter for therapeutic drug level monitoring: Secondary | ICD-10-CM

## 2020-09-01 LAB — POCT INR: INR: 4.4 — AB (ref 2.0–3.0)

## 2020-09-01 NOTE — Telephone Encounter (Signed)
[name] is calling to report INR results.

## 2020-09-01 NOTE — Telephone Encounter (Signed)
INR is being managed by our coumadin clinic. Thanks

## 2020-09-01 NOTE — Patient Instructions (Signed)
Description   Spoke with patient and instructed her to skip Coumadin today, then continue same dosage 5mg  daily except 7.5mg  on Mondays and Fridays. Does weekly checks due to insurance. Recheck in 1 week. Coumadin Clinic: [phone] Patient to go back to 2 cups of romaine lettuce.

## 2020-09-01 NOTE — Telephone Encounter (Signed)
INR was already addressed in anticoag encounter from today, nothing further needed.

## 2020-09-01 NOTE — Telephone Encounter (Signed)
Received call from MD INR starting that patient had a critical INR today of 4.4  Will route to Dr. Acie Fredrickson for advisement.

## 2020-09-02 ENCOUNTER — Other Ambulatory Visit: Payer: Self-pay

## 2020-09-02 ENCOUNTER — Encounter: Payer: Self-pay | Admitting: Occupational Therapy

## 2020-09-02 ENCOUNTER — Ambulatory Visit: Payer: Medicare Other | Admitting: Occupational Therapy

## 2020-09-02 ENCOUNTER — Ambulatory Visit: Payer: Medicare Other

## 2020-09-02 DIAGNOSIS — R29818 Other symptoms and signs involving the nervous system: Secondary | ICD-10-CM

## 2020-09-02 DIAGNOSIS — R2689 Other abnormalities of gait and mobility: Secondary | ICD-10-CM | POA: Diagnosis not present

## 2020-09-02 DIAGNOSIS — R29898 Other symptoms and signs involving the musculoskeletal system: Secondary | ICD-10-CM

## 2020-09-02 DIAGNOSIS — R482 Apraxia: Secondary | ICD-10-CM

## 2020-09-02 DIAGNOSIS — R2681 Unsteadiness on feet: Secondary | ICD-10-CM

## 2020-09-02 DIAGNOSIS — R293 Abnormal posture: Secondary | ICD-10-CM

## 2020-09-02 DIAGNOSIS — I69354 Hemiplegia and hemiparesis following cerebral infarction affecting left non-dominant side: Secondary | ICD-10-CM

## 2020-09-02 DIAGNOSIS — R270 Ataxia, unspecified: Secondary | ICD-10-CM

## 2020-09-02 DIAGNOSIS — R278 Other lack of coordination: Secondary | ICD-10-CM | POA: Diagnosis not present

## 2020-09-02 DIAGNOSIS — M6281 Muscle weakness (generalized): Secondary | ICD-10-CM

## 2020-09-02 NOTE — Therapy (Signed)
[facility] [address] [place], [place], [address] Phone: [phone]   Fax:  [phone]  Physical Therapy Treatment  Patient Details  Name: [name] MRN: [id number] Date of Birth: [birth year] Referring Provider (PT): Marlowe Shores, PA-C (Dr. Percell Miller for follow-up)   Encounter Date: 09/02/2020   PT End of Session - 09/02/20 1314    Visit Number 12    Number of Visits 17    Date for PT Re-Evaluation 17/51/02   90 day cert for 8 wk POC   Authorization Type Medicare/AARP    PT Start Time 1151    PT Stop Time 1232    PT Time Calculation (min) 41 min    Equipment Utilized During Treatment Gait belt    Activity Tolerance Patient tolerated treatment well    Behavior During Therapy [facility] for tasks assessed/performed           Past Medical History:  Diagnosis Date  . Abnormality of gait 09/07/2016  . Allergy   . Diabetes mellitus without complication [facility])    Patient denies this - notes history of glucose intolerance  . GERD (gastroesophageal reflux disease)   . Hemiparesis and alteration of sensations as late effects of stroke ([place]) 09/07/2016  . History of pneumonia 1997  . Hypertension   . S/P MVR (mitral valve replacement)    Mechanical mitral valve replacement at age 6 (done in [place])  // echo 7/17: EF 55-60, normal wall motion, bileaflet mechanical mitral valve prosthesis functioning normally, mild LAE, mildly reduced RVSF, small pericardial effusion  . Stroke [facility]) 1997, 2013, 2015    Past Surgical History:  Procedure Laterality Date  . ABDOMINAL HYSTERECTOMY    . BRAIN SURGERY    . BUNIONECTOMY  1993  . CARDIAC VALVE REPLACEMENT  1997  . TOE SURGERY  1996    There were no vitals filed for this visit.   Subjective Assessment - 09/02/20 1151    Subjective No new complaints. No falls to report. Pain is the same. L arm is tired/sore from OT.    Patient is accompained by: Family  member   spouse, [name]   Patient Stated Goals Pt's goals for therapy are to get back to where I was before-walking more independently with cane.    Currently in Pain? Yes    Pain Score 1     Pain Location Arm    Pain Orientation Left    Pain Descriptors / Indicators Sore    Pain Onset More than a month ago                             [facility] Adult PT Treatment/Exercise - 09/02/20 1207      Transfers   Transfers Sit to Stand;Stand to Sit    Sit to Stand 5: Supervision    Stand to Sit 5: Supervision      Ambulation/Gait   Ambulation/Gait Yes    Ambulation/Gait Assistance 5: Supervision;4: Min guard    Ambulation/Gait Assistance Details Cues on really picking up LLE for increased foot clearance.    Ambulation Distance (Feet) 250 Feet    Assistive device Straight cane;Other (Comment)   L AFO   Gait Pattern Step-through pattern;Decreased stance time - left;Decreased hip/knee flexion - left    Ambulation Surface Level;Indoor    Stairs Yes    Stairs Assistance 4: Min guard    Stair Management Technique Step to pattern;Alternating pattern  Number of Stairs 8    Height of Stairs 6      Neuro Re-ed    Neuro Re-ed Details  Pt instructed in various exercises in // bars: forward reciprocal stepping x 4 trials over 6" orange hurdles, side stepping over orange hurdles x 4 trials, tandem walking on foam beam x 4 trials, side stepping on foam beam. Pt demos ability to complete all dynamic balance activities with CGA and bil UE support on // bars. Pt with 2-finger support for side-stepping activities. Pt then instructed in lateral stepping in both directions on and off 4" box x 10 reps bil with min A for stability in L SLS. Pt then instructed in rockerboard training including stepping forward and backward off rockerboard x10 in L SLS and x 10 in R SLS, requiring CGA and bil UE support. Pt then instructed in slow, controlled rocking forward and back requiring min A  without UE support.  Pt then instructed in alternating marches on rockerboard with bil UE support x 20 reps with cues to bring knees to chest. Pt completed wtith CGA and no LOB.                  PT Education - 09/02/20 1313    Education Details Pt educated on POC and pt progress towards goals.    Person(s) Educated Patient;Spouse    Methods Explanation    Comprehension Verbalized understanding            PT Short Term Goals - 08/26/20 0950      PT SHORT TERM GOAL #1   Title Pt will be independent with HEP for improved strength, balance, transfers, and gait.  TARGET 08/22/2020    Baseline 08/19/20: met with current program, will benefit from advancement as pt improves    Status Achieved      PT SHORT TERM GOAL #2   Title Pt will improve TUG score to less than or equal to 20 seconds for decreased fall risk.    Baseline 08/19/20: 16.1 sec's with cane    Status Achieved      PT SHORT TERM GOAL #3   Title Pt will improve BERG to at least 42/56 for decreased fall risk.    Baseline 08/19/20: 48/56 scored today    Status Achieved      PT SHORT TERM GOAL #4   Title Pt will improve 5x sit<>stand to less than or equal to 15 seconds for improved functional strength.    Baseline 08/19/20: 14.79 sec's no UE support from standard height surface    Status Achieved      PT SHORT TERM GOAL #5   Title Pt will ambulate at least 50 ft, indoor and household surfaces, no device, independently for improved household independence.    Baseline 08/19/20: met in session today    Status Achieved             PT Long Term Goals - 09/02/20 1324      PT LONG TERM GOAL #1   Title Pt will be independent with progression of HEP for improved strength, balance, transfers, and gait.  TARGET 09/19/2020    Time 8    Period Weeks    Status New      PT LONG TERM GOAL #2   Title Pt will improve TUG score to less than or equal to 13 seconds or less for pt to not be considered fall risk.    Baseline 14.75 sec with st.  cane (08/26/20)  Time 8    Period Weeks    Status New    Target Date 09/19/20      PT LONG TERM GOAL #3   Title Pt will improve gait velocity to at least 2 ft/sec for improved gait efficiency and safety and decreased fall risk.    Baseline 1.4 ft/sec at eval; 1.25 ft/sec with st. cane 08/26/20    Time 8    Period Weeks    Status On-going      PT LONG TERM GOAL #4   Title Pt will ambulate at least 500 ft, indoors/outdoors, modified independently for improved community gait.    Time 8    Period Weeks    Status New      PT LONG TERM GOAL #5   Title Pt will improve FOTO score at discharge by at least 10% to demonstrate improved overall functional mobility.    Time 8    Period Weeks    Status New                 Plan - 09/02/20 1315    Clinical Impression Statement Today's skilled session focused on gait and dynamic balance training with rest breaks as needed. Pt is making consistent progress towards goals and should be ready to D/C at the end of POC. PT to continue to progress towards remaining LT goals.    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    Examination-Activity Limitations Locomotion Level;Transfers;Stairs;Stand    Examination-Participation Restrictions Community Activity;Other    Rehab Potential Good    PT Frequency 2x / week    PT Duration 8 weeks    PT Treatment/Interventions ADLs/Self Care Home Management;DME Instruction;Gait training;Stair training;Functional mobility training;Therapeutic activities;Therapeutic exercise;Balance training;Neuromuscular re-education;Patient/family education;Passive range of motion;Orthotic Fit/Training    PT Next Visit Plan Continue dynamic balance training on varying surfaces, continue to train SLS stability on L LE, continue to work on Aeronautical engineer    Consulted and Agree with Plan of Care Patient;Family member/caregiver     Family Member Consulted husband           Patient will benefit from skilled therapeutic intervention in order to improve the following deficits and impairments:  Abnormal gait, Difficulty walking, Decreased range of motion, Impaired tone, Pain, Decreased balance, Decreased mobility, Decreased strength, Postural dysfunction  Visit Diagnosis: Other lack of coordination  Unsteadiness on feet  Other abnormalities of gait and mobility     Problem List Patient Active Problem List   Diagnosis Date Noted  . Goals of care, counseling/discussion   . Palliative care by specialist   . Acute CVA (cerebrovascular accident) ([place]) 07/01/2020  . 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 Anticoagulated on warfarin   . Left hemiparesis ([place])   . History of CVA with residual deficit   . Thrombocytopenia ([place])   . Prediabetes   . CVA (cerebral vascular accident) ([place]) 09/03/2019  . Vertigo 09/24/2017  . Benign paroxysmal positional vertigo   . History of stroke   . Hyperlipemia 08/23/2017  . Encounter for therapeutic drug monitoring 08/08/2017  . Long term (current) use of anticoagulants [Z79.01] 07/12/2017  . Hemiparesis and alteration of sensations as late effects of stroke ([place]) 09/07/2016  . Abnormality of gait 09/07/2016  . HTN (hypertension) 04/13/2016  . H/O mitral valve replacement with mechanical valve 10/16/2015  . Diabetes mellitus without complication ([place])   . Hemiparesis (L sided - mild)  due to old stroke [facility])     Rosalita Levan, SPT 09/02/2020, 1:30 PM  [facility] [address] [place], [place], [address] Phone: [phone]   Fax:  [phone]  Name: [name] MRN: [id number] Date of Birth: Aug 19, 1946

## 2020-09-02 NOTE — Therapy (Signed)
[facility] [address] [place], [place], [address] Phone: [phone]   Fax:  [phone]  Occupational Therapy Treatment  Patient Details  Name: [name] MRN: [id number] Date of Birth: Dec 06, 1945 Referring Provider (OT): Dr. Alysia Penna (for follow-up)   Encounter Date: 09/02/2020   OT End of Session - 09/02/20 1132    Visit Number 6    Number of Visits 10    Date for OT Re-Evaluation 09/10/20    Authorization Type Medicare/AARP, Medicare guidelines    Authorization - Visit Number 6    Authorization - Number of Visits 10    Progress Note Due on Visit 10    OT Start Time 1105    OT Stop Time 1145    OT Time Calculation (min) 40 min    Activity Tolerance Patient tolerated treatment well    Behavior During Therapy [facility] for tasks assessed/performed           Past Medical History:  Diagnosis Date  . Abnormality of gait 09/07/2016  . Allergy   . Diabetes mellitus without complication [facility])    Patient denies this - notes history of glucose intolerance  . GERD (gastroesophageal reflux disease)   . Hemiparesis and alteration of sensations as late effects of stroke ([place]) 09/07/2016  . History of pneumonia 1997  . Hypertension   . S/P MVR (mitral valve replacement)    Mechanical mitral valve replacement at age 49 (done in [place])  // echo 7/17: EF 55-60, normal wall motion, bileaflet mechanical mitral valve prosthesis functioning normally, mild LAE, mildly reduced RVSF, small pericardial effusion  . Stroke [facility]) 1997, 2013, 2015    Past Surgical History:  Procedure Laterality Date  . ABDOMINAL HYSTERECTOMY    . BRAIN SURGERY    . BUNIONECTOMY  1993  . CARDIAC VALVE REPLACEMENT  1997  . TOE SURGERY  1996    There were no vitals filed for this visit.   Subjective Assessment - 09/02/20 1106    Pertinent History acute CVA, small L frontal lobe possible infarct; hospitalized 07/01/2020 and  transferred to inpatient rehab, discharged home 07/16/2020.   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 Yes    Pain Score 1     Pain Location Hip    Pain Orientation Left    Pain Descriptors / Indicators Sore    Pain Type Chronic pain    Pain Onset More than a month ago    Pain Frequency Intermittent    Aggravating Factors  unsure    Pain Relieving Factors tylenol              Standing with RUE support with forward/back wt shifts and LUE arm swing/reach forward/back with min cueing for big movements and posture  Standing, wt. Bearing in modified quadruped with trunk rotation with reaching laterally with each UE for decr tone and neuro re-ed.  Then, forward/backward wt. Shifts with min cueing for positioning (improved tricep activation).  Then forward wt. Shift through L hand with RUE table slide for incr tricep activation and decr spasticity.  Standing, functional reaching laterally to flip cards with min cueing/focus on elbow ext, ER, supination and finger ext for decr spasticity and coordination/LUE functional use.  Sitting, functional  reaching laterally and across body to grasp/release cylinder objects with focus on elbow ext, shoulder ER with min cueing.  Sitting, functional reaching at midlevel to place washers on vertical pole with focus on normal movement patterns and elbow ext with min cueing.  Placing large pegs in pegboard on tabletop for incr coordination with min difficulty, but improved with repetition.             OT Long Term Goals - 08/12/20 1015      OT LONG TERM GOAL #1   Title Pt and husband will be independent with updated HEP to address LUE coordination, strength, and spasticity management-- check LTGs  09/09/20    Time 5    Period Weeks    Status On-going        OT LONG TERM GOAL #2   Title Pt will decr spasticity to be able to ambulate at least 30 feet with only minimal elbow flexion and no shoulder hike of LUE.    Time 5    Period Weeks    Status New      OT LONG TERM GOAL #3   Title Pt will improve LUE coordination as shown by improving time on 9-hole peg test by at least 8sec.    Baseline 66.63sec    Time 5    Period Weeks    Status New      OT LONG TERM GOAL #4   Title Pt will demo at least -25* L elbow ext with functional reach.    Baseline -35*    Time 5    Period Weeks    Status New      OT LONG TERM GOAL #5   Title Pt will improve functional reaching/coordination with LUE as shown by improving score on box and blocks test by at least 4 blocks.    Baseline L-27 blocks    Time 5    Period Weeks    Status New                 Plan - 09/02/20 1133    Clinical Impression Statement Pt continues to make progress with improved LUE functional reach with decr spasticity and incr elbow ext with reach.    Occupational performance deficits (Please refer to evaluation for details): ADL's;IADL's;Leisure;Social Participation    Body Structure / Function / Physical Skills ADL;Balance;Mobility;Tone;Strength;UE functional use;ROM;IADL;Dexterity;Coordination;FMC    OT Frequency 2x / week    OT Duration --   5 weeks   OT Treatment/Interventions Self-care/ADL training;Therapeutic exercise;Functional Mobility Training;Balance training;Manual Therapy;Neuromuscular education;Ultrasound;Energy conservation;Therapeutic activities;DME and/or AE instruction;Electrical Stimulation;Patient/family education;Passive range of motion;Moist Heat;Cryotherapy    Plan Continue functional reaching and coordination LUE emphasizing normal movement patterns, wt. bearing    Consulted and Agree with Plan of Care Patient;Family member/caregiver    Family Member Consulted husband           Patient will benefit from skilled therapeutic intervention in order  to improve the following deficits and impairments:   Body Structure / Function / Physical Skills: ADL, Balance, Mobility, Tone, Strength, UE functional use, ROM, IADL, Dexterity, Coordination, [facility]       Visit Diagnosis: Hemiplegia and hemiparesis following cerebral infarction affecting left non-dominant side (HCC)  Other lack of coordination  Other symptoms and signs involving the nervous system  Other symptoms and signs involving the musculoskeletal system  Unsteadiness on feet  Ataxia  Apraxia  Muscle weakness (generalized)  Abnormal posture    Problem List Patient Active Problem List  Diagnosis Date Noted  . Goals of care, counseling/discussion   . Palliative care by specialist   . Acute CVA (cerebrovascular accident) ([place]) 07/01/2020  . 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 Anticoagulated on warfarin   . Left hemiparesis ([place])   . History of CVA with residual deficit   . Thrombocytopenia ([place])   . Prediabetes   . CVA (cerebral vascular accident) ([place]) 09/03/2019  . Vertigo 09/24/2017  . Benign paroxysmal positional vertigo   . History of stroke   . Hyperlipemia 08/23/2017  . Encounter for therapeutic drug monitoring 08/08/2017  . Long term (current) use of anticoagulants [Z79.01] 07/12/2017  . Hemiparesis and alteration of sensations as late effects of stroke ([place]) 09/07/2016  . Abnormality of gait 09/07/2016  . HTN (hypertension) 04/13/2016  . H/O mitral valve replacement with mechanical valve 10/16/2015  . Diabetes mellitus without complication ([place])   . Hemiparesis (L sided - mild) due to old stroke [facility])     [facility] 09/02/2020, 11:41 AM  [facility] [address] [place], [place], [address] Phone: [phone]   Fax:  [phone]  Name: [name] MRN: [id number] Date of Birth: 1946-05-28   Vianne Bulls, OTR/L [facility] [address]  [place], [place]  [address] [phone] phone [phone] 09/02/20 11:41 AM

## 2020-09-03 ENCOUNTER — Encounter: Payer: Self-pay | Admitting: *Deleted

## 2020-09-03 ENCOUNTER — Other Ambulatory Visit: Payer: Self-pay | Admitting: *Deleted

## 2020-09-03 NOTE — Patient Outreach (Signed)
Gabrielle [facility]) Care Management [facility] CM Telephone Outreach Post- SNF discharge day # 49 without unplanned hospital re-admission  09/03/2020  [name] [birth year] [id number]  Successful telephone outreach toBob Walker Massey, spouse/ caregiver/ on [facility] DPR, forPhyllis "Darlis Massey, 74 y/o female referred to [facility] CM 07/16/20 by [facility] liaison after patient experienced recent hospitalization August 24-27, 2021 for vertigo/ dizziness and was diagnosed with subcortical CVA. She was discharged from hospital to inpatient rehabilitation, and was subsequently discharged home to self-care on 07/16/20 with home health servicesin place.  Patient has history including, but not limited toprevious CVA with hemiparesis; HTN/ HLD; MVR; COPD/ asthma; DM; and GERD.  HIPAA/ identity verified with spouse and patient, who is available to speak with me today.  Patient reports doing "great" in her recuperation and she denies clinical concerns/ issues/ problems; reports has been going to all scheduled outpatient neuro rehabilitation visits and provider appointments; reports has been making great progress with rehabilitation visits and is no longer having to use assistive devices for ambulation, except when she goes outside her home, as a general safety precaution; she denies new/ recent falls and reports feeling steady with ambulation.  Continues managing blood pressures at home and these were reviewed with patient; she reports no significantly abnormal readings at home.  Patient sounds to be in no distress and she and spouse deny further issues, concerns, or problems today. Patient denies ongoing care coordination needs and states she does not feel that she needs ongoing [facility] CM support as she is doing so well in her recuperation; we discussed possibility of transfer to [place], however, patient declines stating that she has been managing blood pressures at home for years and has so many  scheduled medical appointments that she does not believe she wishes to commit to ongoing follow up with [place].  Iconfirmed that patient/ caregiverhavemy direct phone number, the main [facility] CM office phone number, and the [facility] CM 24-hour nurse advice phone number should issues arise in the future and she wish to resume participation in [place] program.  Plan:  Will close [facility] CM program, as patient has successfully met her previously established [facility] CM goals and verbalizes no ongoing care coordination/ disease management/ pharmacy/ community resource needs; will make patient's PCP aware of same.  Goals Addressed              This Visit's Progress   .  COMPLETED: [facility] CM: "She wants to recuperate and get back to her normal self" per caregiver/ spouse report (pt-stated)   On track     [place] (see longitudinal plan of care for additional care plan information)  Current Barriers:  . Chronic Disease Management support and education needs related to CVA  Nurse Case Manager Clinical Goal(s):  Marland Kitchen Over the next 31 days, patient will not experience hospital admission. Inpatient rehab discharge 07/16/20; 07/31/20: on track; goal maintained . Over the next 30 days, patient will attend all scheduled medical appointments 07/31/20: on track; goal maintained . Over the next 60 days, patient will verbalize basic understanding of CVA disease process and self health management plan as evidenced by patient reporting of same during [facility] RN CM outreach 07/31/20: on track, goal maintained  Interventions:  . Inter-disciplinary care team collaboration (see longitudinal plan of care) . Evaluation of current treatment plan related to post- CVA and patient's adherence to plan as established by provider. . Discussed plans with patient's spouse/  caregiver for ongoing care management follow up and provided patient with direct contact information for care management team . Reviewed scheduled/upcoming  provider appointments and confirmed aware of all with plans to attend all as scheduled; confirmed no transportation issues/ concerns . Confirmed that patient/ spouse have all contact information for care providers and understand to promptly notify for any new concerns/ problems . Discussed current clinical condition post- recent SNF discharge with spouse/ caregiver, and confirmed no current clinical concerns  07/31/20 Update: -- discussed current clinical condition with caregiver and confirmed no current clinical concerns --  medication review completed with caregiver; no concerns identified; patient continues self-managing independently -- discussed outpatient rehabilitation visits for PT/ OT: confirmed patient tolerating well -- reviewed with caregiver recent and upcoming provider appointments; confirmed patient has plans to attend all as scheduled -- discussed timing of corona virus vaccine booster around recently obtained flu vaccine -- [facility] CM initial assessment completed and sent via secure messaging through EHR to PCP  09/03/20: -- discussed clinical condition and progress with patient; confirmed she has no clinical concerns and is recuperating well -- confirmed that she continues participating in outpatient neuro rehabilitation program -- continues monitoring/ recording blood pressures at home; denies abnormal values -- continues following healthy diet with restrictions for warfarin; continues INR monitoring at home -- offered transfer of [facility] CM follow up to [place]; patient declined and understands to call [facility] CM for any needs that arise in future  Gabrielle Rack, RN, BSN, [employer] Coordinator [facility] Care Management  [phone]    Patient Self Care Activities:  . Patient's spouse/ caregiver verbalizes understanding of plan to attend all provider and outpatient PT sessions . Self administers medications as prescribed . Attends all scheduled  provider appointments . Calls pharmacy for medication refills . Performs ADL's independently . Performs IADL's independently . Calls provider office for new concerns or questions . No self care deficits identified during call today- 07/18/20  Initial goal documentation 07/18/20 Update 07/31/20    I appreciate the opportunity to participate in [name]'s care,  Gabrielle Rack, RN, BSN, [place] Coordinator [facility] Care Management  [phone]          Gabrielle Rack, RN, BSN, [place] Coordinator [facility] Care Management  [phone]

## 2020-09-04 ENCOUNTER — Other Ambulatory Visit: Payer: Self-pay

## 2020-09-04 ENCOUNTER — Ambulatory Visit: Payer: Medicare Other

## 2020-09-04 ENCOUNTER — Ambulatory Visit: Payer: Medicare Other | Admitting: Occupational Therapy

## 2020-09-04 ENCOUNTER — Encounter: Payer: Self-pay | Admitting: Occupational Therapy

## 2020-09-04 DIAGNOSIS — R278 Other lack of coordination: Secondary | ICD-10-CM | POA: Diagnosis not present

## 2020-09-04 DIAGNOSIS — R293 Abnormal posture: Secondary | ICD-10-CM

## 2020-09-04 DIAGNOSIS — R2689 Other abnormalities of gait and mobility: Secondary | ICD-10-CM

## 2020-09-04 DIAGNOSIS — R29898 Other symptoms and signs involving the musculoskeletal system: Secondary | ICD-10-CM

## 2020-09-04 DIAGNOSIS — R29818 Other symptoms and signs involving the nervous system: Secondary | ICD-10-CM | POA: Diagnosis not present

## 2020-09-04 DIAGNOSIS — R2681 Unsteadiness on feet: Secondary | ICD-10-CM

## 2020-09-04 DIAGNOSIS — M6281 Muscle weakness (generalized): Secondary | ICD-10-CM | POA: Diagnosis not present

## 2020-09-04 DIAGNOSIS — R482 Apraxia: Secondary | ICD-10-CM

## 2020-09-04 DIAGNOSIS — R270 Ataxia, unspecified: Secondary | ICD-10-CM

## 2020-09-04 DIAGNOSIS — I69354 Hemiplegia and hemiparesis following cerebral infarction affecting left non-dominant side: Secondary | ICD-10-CM | POA: Diagnosis not present

## 2020-09-04 NOTE — Therapy (Signed)
[facility] [address] [place], [place], [address] Phone: [phone]   Fax:  [phone]  Physical Therapy Treatment  Patient Details  Name: [name] MRN: [id number] Date of Birth: 12/11/1945 Referring Provider (PT): Dan Angiulli, PA-C (Dr. Kirsteins/Eunice Thomas for follow-up)   Encounter Date: 09/04/2020   PT End of Session - 09/04/20 1228    Visit Number 13    Number of Visits 17    Date for PT Re-Evaluation 10/19/20   90 day cert for 8 wk POC   Authorization Type Medicare/AARP    PT Start Time 1148    PT Stop Time 1228    PT Time Calculation (min) 40 min    Equipment Utilized During Treatment Gait belt    Activity Tolerance Patient tolerated treatment well    Behavior During Therapy WFL for tasks assessed/performed           Past Medical History:  Diagnosis Date  . Abnormality of gait 09/07/2016  . Allergy   . Diabetes mellitus without complication (HCC)    Patient denies this - notes history of glucose intolerance  . GERD (gastroesophageal reflux disease)   . 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) 09/07/2016  . History of pneumonia 1997  . Hypertension   . S/P MVR (mitral valve replacement)    Mechanical mitral valve replacement at age 49 (done in [place])  // echo 7/17: EF 55-60, normal wall motion, bileaflet mechanical mitral valve prosthesis functioning normally, mild LAE, mildly reduced RVSF, small pericardial effusion  . Stroke (HCC) 1997, 2013, 2015    Past Surgical History:  Procedure Laterality Date  . ABDOMINAL HYSTERECTOMY    . BRAIN SURGERY    . BUNIONECTOMY  1993  . CARDIAC VALVE REPLACEMENT  1997  . TOE SURGERY  1996    There were no vitals filed for this visit.   Subjective Assessment - 09/04/20 1151    Subjective No new complaints. No falls to report. Pain is the same. Lateral left leg is sore.    Patient is accompained by: Family  member   spouse, [name]   Patient Stated Goals Pt's goals for therapy are to get back to where I was before-walking more independently with cane.    Currently in Pain? Yes    Pain Score 1     Pain Location Leg    Pain Orientation Left    Pain Descriptors / Indicators Sore    Pain Type Chronic pain    Pain Onset More than a month ago                             [facility] Adult PT Treatment/Exercise - 09/04/20 1206      Transfers   Transfers Sit to Stand;Stand to Sit    Sit to Stand 5: Supervision    Stand to Sit 5: Supervision      Ambulation/Gait   Ambulation/Gait Yes    Ambulation/Gait Assistance 5: Supervision;4: Min guard    Ambulation/Gait Assistance Details outdoor on unlevel concrete and grass. Pt cued on taking larger steps and really picking up feet in the grass. Pt also instructed in horizontal and vertical head turns during outdoor gait training with decreased gait speed and veering to the left and right during horizontal head turns and extremely slowed gait speed with vertical head turns.    Ambulation Distance (Feet) 850 Feet      Assistive device Straight cane;Other (Comment)   L AFO   Gait Pattern Step-through pattern;Decreased stance time - left;Decreased hip/knee flexion - left    Ambulation Surface Outdoor;Unlevel      Neuro Re-ed    Neuro Re-ed Details  Pt instructed in obstacle course navigation including stepping over 6" and 9" orange hurdles on blue and red mat and weaving around 6 cones with 2 sharp turns in each direction. Pt demos ability to complete obstacle course x 3 trials with CGA-min A with >3 errors with LLE trailing and getting caught on hurdles with forward stepping. PT attempted side stepping over hurdles on blue mat with pt exhibiting increased anxiety and froze. Pt attempted to pick up LLE over obstacle x3 and finally got it over with mod A and a mild LOB/                  PT Education - 09/04/20 1225    Education Details Pt  educated on continued HEP performance.    Person(s) Educated Patient;Spouse    Methods Explanation    Comprehension Verbalized understanding            PT Short Term Goals - 08/26/20 0950      PT SHORT TERM GOAL #1   Title Pt will be independent with HEP for improved strength, balance, transfers, and gait.  TARGET 08/22/2020    Baseline 08/19/20: met with current program, will benefit from advancement as pt improves    Status Achieved      PT SHORT TERM GOAL #2   Title Pt will improve TUG score to less than or equal to 20 seconds for decreased fall risk.    Baseline 08/19/20: 16.1 sec's with cane    Status Achieved      PT SHORT TERM GOAL #3   Title Pt will improve BERG to at least 42/56 for decreased fall risk.    Baseline 08/19/20: 48/56 scored today    Status Achieved      PT SHORT TERM GOAL #4   Title Pt will improve 5x sit<>stand to less than or equal to 15 seconds for improved functional strength.    Baseline 08/19/20: 14.79 sec's no UE support from standard height surface    Status Achieved      PT SHORT TERM GOAL #5   Title Pt will ambulate at least 50 ft, indoor and household surfaces, no device, independently for improved household independence.    Baseline 08/19/20: met in session today    Status Achieved             PT Long Term Goals - 09/02/20 1324      PT LONG TERM GOAL #1   Title Pt will be independent with progression of HEP for improved strength, balance, transfers, and gait.  TARGET 09/19/2020    Time 8    Period Weeks    Status New      PT LONG TERM GOAL #2   Title Pt will improve TUG score to less than or equal to 13 seconds or less for pt to not be considered fall risk.    Baseline 14.75 sec with st. cane (08/26/20)    Time 8    Period Weeks    Status New    Target Date 09/19/20      PT LONG TERM GOAL #3   Title Pt will improve gait velocity to at least 2 ft/sec for improved gait efficiency and safety and decreased fall risk.  Baseline 1.4 ft/sec at eval; 1.25 ft/sec with st. cane 08/26/20    Time 8    Period Weeks    Status On-going      PT LONG TERM GOAL #4   Title Pt will ambulate at least 500 ft, indoors/outdoors, modified independently for improved community gait.    Time 8    Period Weeks    Status New      PT LONG TERM GOAL #5   Title Pt will improve FOTO score at discharge by at least 10% to demonstrate improved overall functional mobility.    Time 8    Period Weeks    Status New                 Plan - 09/04/20 1230    Clinical Impression Statement Today's skilled session focused on dynamic gait training including outdoor/unlevel surfaces and horiztontal/vertical head turns. Pt with decreased gait speed and veering with horizontal head turns and drastically decreased gait speed with vertial head turns (but no veering). Pt with increased difficulty with grass ambulation as well as side stepping over hurdles on compliant surface. Pt is limited by anxiousness during tx session. Pt with deficits with LLE motorl control and foot clearance with dynamic gait and obstacle course negotiation. PT to continue to challenge patient in dynamic gait and balance activites and continue to progress towards remaining LT goals.    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    Examination-Activity Limitations Locomotion Level;Transfers;Stairs;Stand    Examination-Participation Restrictions Community Activity;Other    Stability/Clinical Decision Making Evolving/Moderate complexity    Rehab Potential Good    PT Frequency 2x / week    PT Duration 8 weeks    PT Treatment/Interventions ADLs/Self Care Home Management;DME Instruction;Gait training;Stair training;Functional mobility training;Therapeutic activities;Therapeutic exercise;Balance training;Neuromuscular re-education;Patient/family education;Passive range of  motion;Orthotic Fit/Training    PT Next Visit Plan Continue dynamic balance training on varying surfaces, continue to train SLS stability on LLE, continue to work on gait mechanics, Review stairs with alternating pattern    Consulted and Agree with Plan of Care Patient;Family member/caregiver    Family Member Consulted husband           Patient will benefit from skilled therapeutic intervention in order to improve the following deficits and impairments:  Abnormal gait, Difficulty walking, Decreased range of motion, Impaired tone, Pain, Decreased balance, Decreased mobility, Decreased strength, Postural dysfunction  Visit Diagnosis: Unsteadiness on feet  Other abnormalities of gait and mobility  Muscle weakness (generalized)     Problem List Patient Active Problem List   Diagnosis Date Noted  . Goals of care, counseling/discussion   . Palliative care by specialist   . Acute CVA (cerebrovascular accident) ([place]) 07/01/2020  . 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 Anticoagulated on warfarin   . Left hemiparesis ([place])   . History of CVA with residual deficit   . Thrombocytopenia ([place])   . Prediabetes   . CVA (cerebral vascular accident) ([place]) 09/03/2019  . Vertigo 09/24/2017  . Benign paroxysmal positional vertigo   . History of stroke   . Hyperlipemia 08/23/2017  . Encounter for therapeutic drug monitoring 08/08/2017  . Long term (current) use of anticoagulants [Z79.01] 07/12/2017  . Hemiparesis and alteration of sensations as late effects of stroke ([place]) 09/07/2016  . Abnormality of gait 09/07/2016  . HTN (hypertension) 04/13/2016  . H/O mitral valve replacement with mechanical valve 10/16/2015  .  Diabetes mellitus without complication (HCC)   . 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   

## 2020-09-04 NOTE — Therapy (Addendum)
[facility] [address] [place], [place], [address] Phone: [phone]   Fax:  [phone]  Occupational Therapy Treatment  Patient Details  Name: [name] MRN: [id number] Date of Birth: [birth year] Referring Provider (OT): Dr. Alysia Penna (for follow-up)   Encounter Date: 09/04/2020   OT End of Session - 09/04/20 1105    Visit Number 7    Number of Visits 11    Date for OT Re-Evaluation 09/10/20    Authorization Type Medicare/AARP, Medicare guidelines    Authorization - Visit Number 7    Authorization - Number of Visits 10    Progress Note Due on Visit 10    OT Start Time 1104    OT Stop Time 1145    OT Time Calculation (min) 41 min    Activity Tolerance Patient tolerated treatment well    Behavior During Therapy [facility] for tasks assessed/performed           Past Medical History:  Diagnosis Date  . Abnormality of gait 09/07/2016  . Allergy   . Diabetes mellitus without complication [facility])    Patient denies this - notes history of glucose intolerance  . GERD (gastroesophageal reflux disease)   . Hemiparesis and alteration of sensations as late effects of stroke ([place]) 09/07/2016  . History of pneumonia 1997  . Hypertension   . S/P MVR (mitral valve replacement)    Mechanical mitral valve replacement at age 17 (done in [place])  // echo 7/17: EF 55-60, normal wall motion, bileaflet mechanical mitral valve prosthesis functioning normally, mild LAE, mildly reduced RVSF, small pericardial effusion  . Stroke [facility]) 1997, 2013, 2015    Past Surgical History:  Procedure Laterality Date  . ABDOMINAL HYSTERECTOMY    . BRAIN SURGERY    . BUNIONECTOMY  1993  . CARDIAC VALVE REPLACEMENT  1997  . TOE SURGERY  1996    There were no vitals filed for this visit.   Subjective Assessment - 09/04/20 1102    Pertinent History acute CVA, small L frontal lobe possible infarct; hospitalized 07/01/2020 and  transferred to inpatient rehab, discharged home 07/16/2020.   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 No/denies    Pain Onset More than a month ago              Placing grooved pegs in pegboard for incr in-hand manipulation with mod difficulty/incr time, cueing for finger ext stretch regularly for tone management.   Standing with RUE support with forward/back wt shifts and LUE arm swing/reach forward/back with min cueing for big movements and posture.  Then with trunk rotation and ER/abduction with LUE for stretch with min cueing.  Standing, shoulder flex with hemi-glide for higher ranges with min cueing and facilitation initially for elbow ext.  Sitting, light wt. Bearing through tilted stool with 2lb wt for incr resistance and min cueing for controlled elbow ext with shoulder flex and with elbow flex.   Standing at counter, lateral wt. Shifts with functional reaching to each side with each UE with min cueing.  Arm bike x5 min level 1 for reciprocal movement and elbow ext LUE.        OT Long Term Goals - 09/02/20 1158  OT LONG TERM GOAL #1   Title Pt and husband will be independent with updated HEP to address LUE coordination, strength, and spasticity management-- check LTGs  09/09/20    Time 5    Period Weeks    Status On-going      OT LONG TERM GOAL #2   Title Pt will decr spasticity to be able to ambulate at least 30 feet with only minimal elbow flexion and no shoulder hike of LUE.    Time 5    Period Weeks    Status New      OT LONG TERM GOAL #3   Title Pt will improve LUE coordination as shown by improving time on 9-hole peg test by at least 8sec.    Baseline 66.63sec    Time 5    Period Weeks    Status New      OT LONG TERM GOAL #4   Title Pt will demo at least  -25* L elbow ext with functional reach.    Baseline -35*    Time 5    Period Weeks    Status Achieved   09/02/20:  -10*     OT LONG TERM GOAL #5   Title Pt will improve functional reaching/coordination with LUE as shown by improving score on box and blocks test by at least 4 blocks.    Baseline L-27 blocks    Time 5    Period Weeks    Status New                 Plan - 09/04/20 1107    Clinical Impression Statement Pt continues to progress with LUE functional reach and coordination.    Occupational performance deficits (Please refer to evaluation for details): ADL's;IADL's;Leisure;Social Participation    Body Structure / Function / Physical Skills ADL;Balance;Mobility;Tone;Strength;UE functional use;ROM;IADL;Dexterity;Coordination;FMC    OT Frequency 2x / week    OT Duration --   5 weeks   OT Treatment/Interventions Self-care/ADL training;Therapeutic exercise;Functional Mobility Training;Balance training;Manual Therapy;Neuromuscular education;Ultrasound;Energy conservation;Therapeutic activities;DME and/or AE instruction;Electrical Stimulation;Patient/family education;Passive range of motion;Moist Heat;Cryotherapy    Plan Continue functional reaching and coordination LUE emphasizing normal movement patterns, wt. bearing; (check KX)    Consulted and Agree with Plan of Care Patient;Family member/caregiver    Family Member Consulted husband           Patient will benefit from skilled therapeutic intervention in order to improve the following deficits and impairments:   Body Structure / Function / Physical Skills: ADL, Balance, Mobility, Tone, Strength, UE functional use, ROM, IADL, Dexterity, Coordination, [facility]       Visit Diagnosis: Hemiplegia and hemiparesis following cerebral infarction affecting left non-dominant side (HCC)  Other lack of coordination  Other symptoms and signs involving the nervous system  Other symptoms and signs involving the musculoskeletal  system  Unsteadiness on feet  Ataxia  Apraxia  Abnormal posture    Problem List Patient Active Problem List   Diagnosis Date Noted  . Goals of care, counseling/discussion   . Palliative care by specialist   . Acute CVA (cerebrovascular accident) ([place]) 07/01/2020  . 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 Anticoagulated on warfarin   . Left hemiparesis ([place])   . History of CVA with residual deficit   . Thrombocytopenia ([place])   . Prediabetes   . CVA (cerebral vascular accident) ([place]) 09/03/2019  . Vertigo 09/24/2017  . Benign paroxysmal positional vertigo   . History of stroke   . Hyperlipemia 08/23/2017  . Encounter  for therapeutic drug monitoring 08/08/2017  . Long term (current) use of anticoagulants [Z79.01] 07/12/2017  . Hemiparesis and alteration of sensations as late effects of stroke ([place]) 09/07/2016  . Abnormality of gait 09/07/2016  . HTN (hypertension) 04/13/2016  . H/O mitral valve replacement with mechanical valve 10/16/2015  . Diabetes mellitus without complication ([place])   . Hemiparesis (L sided - mild) due to old stroke [facility])     [facility] 09/04/2020, 3:22 PM  [facility] [address] [place], [place], [address] Phone: [phone]   Fax:  [phone]  Name: [name] MRN: [id number] Date of Birth: 09/05/1946   Vianne Bulls, OTR/L [facility] [address] [place], [place]  [address] [phone] phone [phone] 09/04/20 3:22 PM

## 2020-09-08 ENCOUNTER — Ambulatory Visit (INDEPENDENT_AMBULATORY_CARE_PROVIDER_SITE_OTHER): Payer: Medicare Other | Admitting: *Deleted

## 2020-09-08 DIAGNOSIS — Z5181 Encounter for therapeutic drug level monitoring: Secondary | ICD-10-CM

## 2020-09-08 DIAGNOSIS — I69359 Hemiplegia and hemiparesis following cerebral infarction affecting unspecified side: Secondary | ICD-10-CM

## 2020-09-08 DIAGNOSIS — Z952 Presence of prosthetic heart valve: Secondary | ICD-10-CM

## 2020-09-08 DIAGNOSIS — Z7901 Long term (current) use of anticoagulants: Secondary | ICD-10-CM | POA: Diagnosis not present

## 2020-09-08 LAB — POCT INR: INR: 2.9 (ref 2.0–3.0)

## 2020-09-08 NOTE — Patient Instructions (Signed)
Description   Spoke with patient and instructed her to take 10mg  today of Warfarin then continue same dosage 5mg  daily except 7.5mg  on Mondays and Fridays. Does weekly checks due to insurance. Recheck in 1 week. Coumadin Clinic: [phone] Patient to go back to 2 cups of romaine lettuce.

## 2020-09-10 ENCOUNTER — Ambulatory Visit: Payer: Medicare Other | Admitting: Occupational Therapy

## 2020-09-10 ENCOUNTER — Other Ambulatory Visit: Payer: Self-pay

## 2020-09-10 ENCOUNTER — Ambulatory Visit: Payer: Medicare Other | Attending: Physician Assistant

## 2020-09-10 ENCOUNTER — Encounter: Payer: Self-pay | Admitting: Occupational Therapy

## 2020-09-10 DIAGNOSIS — R293 Abnormal posture: Secondary | ICD-10-CM | POA: Insufficient documentation

## 2020-09-10 DIAGNOSIS — R2681 Unsteadiness on feet: Secondary | ICD-10-CM | POA: Insufficient documentation

## 2020-09-10 DIAGNOSIS — R2689 Other abnormalities of gait and mobility: Secondary | ICD-10-CM | POA: Insufficient documentation

## 2020-09-10 DIAGNOSIS — I69354 Hemiplegia and hemiparesis following cerebral infarction affecting left non-dominant side: Secondary | ICD-10-CM

## 2020-09-10 DIAGNOSIS — R278 Other lack of coordination: Secondary | ICD-10-CM | POA: Insufficient documentation

## 2020-09-10 DIAGNOSIS — R29818 Other symptoms and signs involving the nervous system: Secondary | ICD-10-CM | POA: Diagnosis not present

## 2020-09-10 DIAGNOSIS — R29898 Other symptoms and signs involving the musculoskeletal system: Secondary | ICD-10-CM | POA: Insufficient documentation

## 2020-09-10 DIAGNOSIS — M6281 Muscle weakness (generalized): Secondary | ICD-10-CM | POA: Diagnosis not present

## 2020-09-10 NOTE — Therapy (Addendum)
[facility] [address] [place], [place], [address] Phone: [phone]   Fax:  [phone]  Occupational Therapy Treatment  Patient Details  Name: [name] MRN: [id number] Date of Birth: 1946-02-09 Referring Provider (OT): Dr. Alysia Penna (for follow-up)   Encounter Date: 09/10/2020   OT End of Session - 09/10/20 0839    Visit Number 8    Number of Visits 11    Date for OT Re-Evaluation 09/10/20    Authorization Type Medicare/AARP, Medicare guidelines    Authorization Time Period Pt had 4 visits in January, KX at visit 15 total    Authorization - Visit Number 8    Authorization - Number of Visits 10    Progress Note Due on Visit 10    OT Start Time 0804    OT Stop Time 0844    OT Time Calculation (min) 40 min    Activity Tolerance Patient tolerated treatment well    Behavior During Therapy [facility] for tasks assessed/performed           Past Medical History:  Diagnosis Date  . Abnormality of gait 09/07/2016  . Allergy   . Diabetes mellitus without complication [facility])    Patient denies this - notes history of glucose intolerance  . GERD (gastroesophageal reflux disease)   . Hemiparesis and alteration of sensations as late effects of stroke ([place]) 09/07/2016  . History of pneumonia 1997  . Hypertension   . S/P MVR (mitral valve replacement)    Mechanical mitral valve replacement at age 32 (done in [place])  // echo 7/17: EF 55-60, normal wall motion, bileaflet mechanical mitral valve prosthesis functioning normally, mild LAE, mildly reduced RVSF, small pericardial effusion  . Stroke [facility]) 1997, 2013, 2015    Past Surgical History:  Procedure Laterality Date  . ABDOMINAL HYSTERECTOMY    . BRAIN SURGERY    . BUNIONECTOMY  1993  . CARDIAC VALVE REPLACEMENT  1997  . TOE SURGERY  1996    There were no vitals filed for this visit.   Subjective Assessment - 09/10/20 0838    Subjective  Pt reports hip  pain    Pertinent History acute CVA, small L frontal lobe possible infarct; hospitalized 07/01/2020 and transferred to inpatient rehab, discharged home 07/16/2020.   PMH:  hx of multiple R hemipheric strokes with residual L mild spastic hemiparesis, mechanical heart valve, DM    Patient Stated Goals loosen up left arm, be able to keep it down when I walk    Currently in Pain? Yes    Pain Score 1     Pain Location Leg    Pain Orientation Left    Pain Descriptors / Indicators Aching    Pain Type Chronic pain    Pain Onset More than a month ago    Pain Frequency Intermittent    Aggravating Factors  unsure    Pain Relieving Factors tylenol                      Placing small  pegs in pegboard for incr fine motor coordination to copy small peg design, mod difficulty with in hand manipulation  Sitting, light wt. Bearing through tilted stool with LUE and min cueing for controlled elbow ext with shoulder flex and with elbow flex.   Seated on mat , lateral wt. Shifts with weightbearing through bilateral UE's to each side with each UE with min cueing.  Arm bike x5 min level 1  for reciprocal movement and elbow ext LUE.  Closed chain low to mid range shoulder flexion, min facilitation                OT Long Term Goals - 09/02/20 1158      OT LONG TERM GOAL #1   Title Pt and husband will be independent with updated HEP to address LUE coordination, strength, and spasticity management-- check LTGs  09/09/20    Time 5    Period Weeks    Status On-going      OT LONG TERM GOAL #2   Title Pt will decr spasticity to be able to ambulate at least 30 feet with only minimal elbow flexion and no shoulder hike of LUE.    Time 5    Period Weeks    Status New      OT LONG TERM GOAL #3   Title Pt will improve LUE coordination as shown by improving time on 9-hole peg test by at least 8sec.    Baseline 66.63sec    Time 5    Period Weeks    Status New      OT LONG TERM GOAL #4    Title Pt will demo at least -25* L elbow ext with functional reach.    Baseline -35*    Time 5    Period Weeks    Status Achieved   09/02/20:  -10*     OT LONG TERM GOAL #5   Title Pt will improve functional reaching/coordination with LUE as shown by improving score on box and blocks test by at least 4 blocks.    Baseline L-27 blocks    Time 5    Period Weeks    Status New                 Plan - 09/10/20 0841    Clinical Impression Statement Pt is progressing towards goals for LUE functional use.    Occupational performance deficits (Please refer to evaluation for details): ADL's;IADL's;Leisure;Social Participation    Body Structure / Function / Physical Skills ADL;Balance;Mobility;Tone;Strength;UE functional use;ROM;IADL;Dexterity;Coordination;FMC    OT Frequency 2x / week    OT Duration --   5 weeks   OT Treatment/Interventions Self-care/ADL training;Therapeutic exercise;Functional Mobility Training;Balance training;Manual Therapy;Neuromuscular education;Ultrasound;Energy conservation;Therapeutic activities;DME and/or AE instruction;Electrical Stimulation;Patient/family education;Passive range of motion;Moist Heat;Cryotherapy    Plan Continue functional reaching and coordination LUE emphasizing normal movement patterns, wt. bearing; (check KX)    Consulted and Agree with Plan of Care Patient;Family member/caregiver    Family Member Consulted husband           Patient will benefit from skilled therapeutic intervention in order to improve the following deficits and impairments:   Body Structure / Function / Physical Skills: ADL, Balance, Mobility, Tone, Strength, UE functional use, ROM, IADL, Dexterity, Coordination, [facility]       Visit Diagnosis: Hemiplegia and hemiparesis following cerebral infarction affecting left non-dominant side (HCC)  Other lack of coordination  Other symptoms and signs involving the nervous system  Other symptoms and signs involving the  musculoskeletal system  Abnormal posture    Problem List Patient Active Problem List   Diagnosis Date Noted  . Goals of care, counseling/discussion   . Palliative care by specialist   . Acute CVA (cerebrovascular accident) ([place]) 07/01/2020  . 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 Anticoagulated on warfarin   . Left hemiparesis ([place])   . History of CVA with residual deficit   . Thrombocytopenia ([place])   .  Prediabetes   . CVA (cerebral vascular accident) ([place]) 09/03/2019  . Vertigo 09/24/2017  . Benign paroxysmal positional vertigo   . History of stroke   . Hyperlipemia 08/23/2017  . Encounter for therapeutic drug monitoring 08/08/2017  . Long term (current) use of anticoagulants [Z79.01] 07/12/2017  . Hemiparesis and alteration of sensations as late effects of stroke ([place]) 09/07/2016  . Abnormality of gait 09/07/2016  . HTN (hypertension) 04/13/2016  . H/O mitral valve replacement with mechanical valve 10/16/2015  . Diabetes mellitus without complication ([place])   . 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

## 2020-09-10 NOTE — Therapy (Signed)
[facility] [address] [place], [place], [address] Phone: [phone]   Fax:  [phone]  Physical Therapy Treatment  Patient Details  Name: [name] MRN: [id number] Date of Birth: 11/18/1945 Referring Provider (PT): Marlowe Shores, PA-C (Dr. Percell Miller for follow-up)   Encounter Date: 09/10/2020   PT End of Session - 09/10/20 1043    Visit Number 14    Number of Visits 17    Date for PT Re-Evaluation 08/67/61   90 day cert for 8 wk POC   Authorization Type Medicare/AARP    PT Start Time 0933    PT Stop Time 1013    PT Time Calculation (min) 40 min    Equipment Utilized During Treatment Gait belt;Other (comment)   L AFO   Activity Tolerance Patient tolerated treatment well    Behavior During Therapy WFL for tasks assessed/performed           Past Medical History:  Diagnosis Date  . Abnormality of gait 09/07/2016  . Allergy   . Diabetes mellitus without complication [facility])    Patient denies this - notes history of glucose intolerance  . GERD (gastroesophageal reflux disease)   . Hemiparesis and alteration of sensations as late effects of stroke ([place]) 09/07/2016  . History of pneumonia 1997  . Hypertension   . S/P MVR (mitral valve replacement)    Mechanical mitral valve replacement at age 19 (done in [place])  // echo 7/17: EF 55-60, normal wall motion, bileaflet mechanical mitral valve prosthesis functioning normally, mild LAE, mildly reduced RVSF, small pericardial effusion  . Stroke [facility]) 1997, 2013, 2015    Past Surgical History:  Procedure Laterality Date  . ABDOMINAL HYSTERECTOMY    . BRAIN SURGERY    . BUNIONECTOMY  1993  . CARDIAC VALVE REPLACEMENT  1997  . TOE SURGERY  1996    There were no vitals filed for this visit.   Subjective Assessment - 09/10/20 0936    Subjective Pt reports getting booster shot Friday and having arm soreness after along with exhaustion for a couple  of days. No falls. Pain has been off and on.    Patient is accompained by: Family member   spouse, [name]   Patient Stated Goals Pt's goals for therapy are to get back to where I was before-walking more independently with cane.    Currently in Pain? No/denies    Pain Onset More than a month ago                             [facility] Adult PT Treatment/Exercise - 09/10/20 0935      Ambulation/Gait   Ambulation/Gait Yes    Ambulation/Gait Assistance 5: Supervision    Ambulation/Gait Assistance Details Pt cued on remaining upright and not leaning to L.     Ambulation Distance (Feet) 575 Feet    Assistive device Straight cane;Other (Comment)   L AFO   Gait Pattern Step-through pattern;Decreased stance time - left;Decreased hip/knee flexion - left    Ambulation Surface Level;Indoor    Stairs Yes    Stairs Assistance 4: Min guard    Stair Management Technique Step to pattern;Alternating pattern    Number of Stairs 16    Height of Stairs 6   x4 on 9" steps     Neuro Re-ed    Neuro Re-ed Details  Pt was instructed in rockerboard training. Ant/Post rockerboard: maintaining equilibrium x 1  min, EC x 1 min, horiztonal head turns with EO x 10 reps and EC x 10 reps, vertical head turns with EO x 10 reps and EC x 10 reps. Mediolateral rocking: balancing x 1 min without UE support, slow and controlled weight-shifting x 20 reps, EC x 1 minute. Pt requires CGA with min A intermittently for all rockerboard training with no UE support. Pt reported feeling "swimmy-headed" after mediolateral rockerboard training with EC. PT then instructed pt in ramp training with SPC and blue mat surface x2 up/down the ramp requiring min A with moderate unsteadiness. Pt with more difficulty going downhill with 1 mild LOB requiring mod A to correct. Pt then instructed in STS training on blue airex x3 from mat requiring mod A, with pt getting anxious d/t compliant surface. Pt performed x5 more STS from elevated mat  surface on non-compliant surfaces requiring CGA.                    PT Short Term Goals - 08/26/20 0950      PT SHORT TERM GOAL #1   Title Pt will be independent with HEP for improved strength, balance, transfers, and gait.  TARGET 08/22/2020    Baseline 08/19/20: met with current program, will benefit from advancement as pt improves    Status Achieved      PT SHORT TERM GOAL #2   Title Pt will improve TUG score to less than or equal to 20 seconds for decreased fall risk.    Baseline 08/19/20: 16.1 sec's with cane    Status Achieved      PT SHORT TERM GOAL #3   Title Pt will improve BERG to at least 42/56 for decreased fall risk.    Baseline 08/19/20: 48/56 scored today    Status Achieved      PT SHORT TERM GOAL #4   Title Pt will improve 5x sit<>stand to less than or equal to 15 seconds for improved functional strength.    Baseline 08/19/20: 14.79 sec's no UE support from standard height surface    Status Achieved      PT SHORT TERM GOAL #5   Title Pt will ambulate at least 50 ft, indoor and household surfaces, no device, independently for improved household independence.    Baseline 08/19/20: met in session today    Status Achieved             PT Long Term Goals - 09/02/20 1324      PT LONG TERM GOAL #1   Title Pt will be independent with progression of HEP for improved strength, balance, transfers, and gait.  TARGET 09/19/2020    Time 8    Period Weeks    Status New      PT LONG TERM GOAL #2   Title Pt will improve TUG score to less than or equal to 13 seconds or less for pt to not be considered fall risk.    Baseline 14.75 sec with st. cane (08/26/20)    Time 8    Period Weeks    Status New    Target Date 09/19/20      PT LONG TERM GOAL #3   Title Pt will improve gait velocity to at least 2 ft/sec for improved gait efficiency and safety and decreased fall risk.    Baseline 1.4 ft/sec at eval; 1.25 ft/sec with st. cane 08/26/20    Time 8    Period  Weeks    Status On-going  PT LONG TERM GOAL #4   Title Pt will ambulate at least 500 ft, indoors/outdoors, modified independently for improved community gait.    Time 8    Period Weeks    Status New      PT LONG TERM GOAL #5   Title Pt will improve FOTO score at discharge by at least 10% to demonstrate improved overall functional mobility.    Time 8    Period Weeks    Status New                 Plan - 09/10/20 1044    Clinical Impression Statement Today's skilled session focused on gait training, stair training and dynamic balance training. Pt most challenged with vision removed and head turns on compliant surfaces as well as downhill ramp navigation on blue mat. Pt relies heavily on RLE during STS training. Pt continues to show deficits with LLE stance phase and motor control as well as balance activities with vision removed. PT to continue to challenge pt in functional activities that challenge L SLS and dynamic balance with vision removed. PT to continue to progress towards remaining LTGs.    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    Examination-Activity Limitations Locomotion Level;Transfers;Stairs;Stand    Examination-Participation Restrictions Community Activity;Other    Stability/Clinical Decision Making Evolving/Moderate complexity    Rehab Potential Good    PT Frequency 2x / week    PT Duration 8 weeks    PT Treatment/Interventions ADLs/Self Care Home Management;DME Instruction;Gait training;Stair training;Functional mobility training;Therapeutic activities;Therapeutic exercise;Balance training;Neuromuscular re-education;Patient/family education;Passive range of motion;Orthotic Fit/Training    PT Next Visit Plan Forward stepping with working on foot clearance over obstacles with trailing leg. Continue dynamic balance training on varying surfaces, continue to train SLS  stability on LLE, continue to work on gait mechanics, Review stairs with alternating pattern    Consulted and Agree with Plan of Care Patient;Family member/caregiver    Family Member Consulted husband           Patient will benefit from skilled therapeutic intervention in order to improve the following deficits and impairments:  Abnormal gait, Difficulty walking, Decreased range of motion, Impaired tone, Pain, Decreased balance, Decreased mobility, Decreased strength, Postural dysfunction  Visit Diagnosis: Hemiplegia and hemiparesis following cerebral infarction affecting left non-dominant side (HCC)  Unsteadiness on feet  Other abnormalities of gait and mobility     Problem List Patient Active Problem List   Diagnosis Date Noted  . Goals of care, counseling/discussion   . Palliative care by specialist   . Acute CVA (cerebrovascular accident) ([place]) 07/01/2020  . 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 Anticoagulated on warfarin   . Left hemiparesis ([place])   . History of CVA with residual deficit   . Thrombocytopenia ([place])   . Prediabetes   . CVA (cerebral vascular accident) ([place]) 09/03/2019  . Vertigo 09/24/2017  . Benign paroxysmal positional vertigo   . History of stroke   . Hyperlipemia 08/23/2017  . Encounter for therapeutic drug monitoring 08/08/2017  . Long term (current) use of anticoagulants [Z79.01] 07/12/2017  . Hemiparesis and alteration of sensations as late effects of stroke ([place]) 09/07/2016  . Abnormality of gait 09/07/2016  . HTN (hypertension) 04/13/2016  . H/O mitral valve replacement with mechanical valve 10/16/2015  . Diabetes mellitus without complication ([place])   . Hemiparesis (L sided - mild) due to old stroke ([place])  Rosalita Levan, SPT 09/10/2020, 10:56 AM  [facility] [address] [place], [place], [address] Phone: [phone]   Fax:  [phone]  Name: [name] MRN:  [id number] Date of Birth: 1946/04/10

## 2020-09-12 ENCOUNTER — Encounter: Payer: Self-pay | Admitting: Occupational Therapy

## 2020-09-12 ENCOUNTER — Encounter: Payer: Medicare Other | Attending: Registered Nurse | Admitting: Physical Medicine & Rehabilitation

## 2020-09-12 ENCOUNTER — Other Ambulatory Visit: Payer: Self-pay

## 2020-09-12 ENCOUNTER — Encounter: Payer: Self-pay | Admitting: Physical Medicine & Rehabilitation

## 2020-09-12 ENCOUNTER — Ambulatory Visit: Payer: Medicare Other

## 2020-09-12 ENCOUNTER — Ambulatory Visit: Payer: Medicare Other | Admitting: Occupational Therapy

## 2020-09-12 VITALS — BP 120/75 | HR 62 | Temp 97.7°F | Ht 67.0 in | Wt 131.2 lb

## 2020-09-12 DIAGNOSIS — R29898 Other symptoms and signs involving the musculoskeletal system: Secondary | ICD-10-CM

## 2020-09-12 DIAGNOSIS — R2689 Other abnormalities of gait and mobility: Secondary | ICD-10-CM

## 2020-09-12 DIAGNOSIS — G8194 Hemiplegia, unspecified affecting left nondominant side: Secondary | ICD-10-CM | POA: Diagnosis not present

## 2020-09-12 DIAGNOSIS — R278 Other lack of coordination: Secondary | ICD-10-CM | POA: Diagnosis not present

## 2020-09-12 DIAGNOSIS — R29818 Other symptoms and signs involving the nervous system: Secondary | ICD-10-CM | POA: Diagnosis not present

## 2020-09-12 DIAGNOSIS — I639 Cerebral infarction, unspecified: Secondary | ICD-10-CM | POA: Diagnosis not present

## 2020-09-12 DIAGNOSIS — I69354 Hemiplegia and hemiparesis following cerebral infarction affecting left non-dominant side: Secondary | ICD-10-CM

## 2020-09-12 DIAGNOSIS — R2681 Unsteadiness on feet: Secondary | ICD-10-CM | POA: Diagnosis not present

## 2020-09-12 DIAGNOSIS — M6281 Muscle weakness (generalized): Secondary | ICD-10-CM

## 2020-09-12 NOTE — Therapy (Signed)
[facility] [address] [place], [place], [address] Phone: [phone]   Fax:  [phone]  Occupational Therapy Treatment  Patient Details  Name: [name] MRN: [id number] Date of Birth: 05-25-1946 Referring Provider (OT): Dr. Alysia Penna (for follow-up)   Encounter Date: 09/12/2020   OT End of Session - 09/12/20 0938    Visit Number 9    Number of Visits 11    Date for OT Re-Evaluation 09/10/20    Authorization Type Medicare/AARP, Medicare guidelines    Authorization Time Period Pt had 4 visits in January, KX at visit 15 total    Authorization - Visit Number 9    Authorization - Number of Visits 10    OT Start Time [phone]    OT Stop Time 1015    OT Time Calculation (min) 39 min           Past Medical History:  Diagnosis Date  . Abnormality of gait 09/07/2016  . Allergy   . Diabetes mellitus without complication [facility])    Patient denies this - notes history of glucose intolerance  . GERD (gastroesophageal reflux disease)   . Hemiparesis and alteration of sensations as late effects of stroke ([place]) 09/07/2016  . History of pneumonia 1997  . Hypertension   . S/P MVR (mitral valve replacement)    Mechanical mitral valve replacement at age 74 (done in [place])  // echo 7/17: EF 55-60, normal wall motion, bileaflet mechanical mitral valve prosthesis functioning normally, mild LAE, mildly reduced RVSF, small pericardial effusion  . Stroke [facility]) 1997, 2013, 2015    Past Surgical History:  Procedure Laterality Date  . ABDOMINAL HYSTERECTOMY    . BRAIN SURGERY    . BUNIONECTOMY  1993  . CARDIAC VALVE REPLACEMENT  1997  . TOE SURGERY  1996    There were no vitals filed for this visit.   Subjective Assessment - 09/12/20 1008    Subjective  Pt denies pain    Pertinent History acute CVA, small L frontal lobe possible infarct; hospitalized 07/01/2020 and transferred to inpatient rehab, discharged home  07/16/2020.   PMH:  hx of multiple R hemipheric strokes with residual L mild spastic hemiparesis, mechanical heart valve, DM    Patient Stated Goals loosen up left arm, be able to keep it down when I walk    Currently in Pain? No/denies    Pain Onset More than a month ago                Treatment: Placing large to small  pegs in pegboard for incr fine motor coordination min- mod difficulty with in hand manipulation with LUE min v.c for positioning  Supine closed chain shoulder flexion , min to mod facilitation for shoulder and elbow positioning  Closed chain low to mid range shoulder flexion, min facilitation  Sitting, light wt. Bearing through tilted stool with 2lb wt for incr resistance and min cueing for controlled elbow ext with shoulder flex and with elbow flex.   Checked box/ blocks, pt is only scheduled for 1 more week.                      OT Long Term Goals - 09/12/20 0955      OT LONG TERM GOAL #1   Title Pt and husband will be independent with updated HEP to address LUE coordination, strength, and spasticity management-- check LTGs  09/09/20    Time 5  Period Weeks    Status On-going      OT LONG TERM GOAL #2   Title Pt will decr spasticity to be able to ambulate at least 30 feet with only minimal elbow flexion and no shoulder hike of LUE.    Time 5    Period Weeks    Status On-going      OT LONG TERM GOAL #3   Title Pt will improve LUE coordination as shown by improving time on 9-hole peg test by at least 8sec.    Baseline 66.63sec    Time 5    Period Weeks    Status On-going      OT LONG TERM GOAL #4   Title Pt will demo at least -25* L elbow ext with functional reach.    Baseline -35*    Time 5    Period Weeks    Status Achieved   09/02/20:  -10*     OT LONG TERM GOAL #5   Title Pt will improve functional reaching/coordination with LUE as shown by improving score on box and blocks test by at least 4 blocks.    Baseline L-27 blocks     Time 5    Period Weeks    Status On-going   24 blocks, 31 blocks- 09/12/20                 Patient will benefit from skilled therapeutic intervention in order to improve the following deficits and impairments:           Visit Diagnosis: Hemiplegia and hemiparesis following cerebral infarction affecting left non-dominant side (HCC)  Other lack of coordination  Other symptoms and signs involving the nervous system  Other symptoms and signs involving the musculoskeletal system    Problem List Patient Active Problem List   Diagnosis Date Noted  . Goals of care, counseling/discussion   . Palliative care by specialist   . Acute CVA (cerebrovascular accident) ([place]) 07/01/2020  . Subcortical infarction ([place]) 09/05/2019  . Anticoagulated on warfarin   . Left hemiparesis ([place])   . History of CVA with residual deficit   . Thrombocytopenia ([place])   . Prediabetes   . CVA (cerebral vascular accident) ([place]) 09/03/2019  . Vertigo 09/24/2017  . Benign paroxysmal positional vertigo   . History of stroke   . Hyperlipemia 08/23/2017  . Encounter for therapeutic drug monitoring 08/08/2017  . Long term (current) use of anticoagulants [Z79.01] 07/12/2017  . Hemiparesis and alteration of sensations as late effects of stroke ([place]) 09/07/2016  . Abnormality of gait 09/07/2016  . HTN (hypertension) 04/13/2016  . H/O mitral valve replacement with mechanical valve 10/16/2015  . Diabetes mellitus without complication ([place])   . Hemiparesis (L sided - mild) due to old stroke ([place])     Moksh Loomer 09/12/2020, 10:09 AM  [facility] [address] [place], [place], [address] Phone: [phone]   Fax:  [phone]  Name: [name] MRN: [id number] Date of Birth: 31-Jan-1946

## 2020-09-12 NOTE — Progress Notes (Signed)
Subjective:    Patient ID: [name], female    DOB: 1946/08/23, 74 y.o.   MRN: [id number] 74 y.o. 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  Per chart review lives with spouse.  Used a cane prior to admission and a left AFO.  She has a son in [place].  Presented 07/01/2020 with dizziness and weakness x3 days.  MRI showed single small cortical subcortical ischemic nonhemorrhagic infarct involving the parasagittal posterior left frontal lobe.  Underlying moderate to advanced age-related cerebral atrophy with chronic microvascular ischemic disease with multiple remote lacunar infarcts involving the left thalamus and bilateral cerebellar hemispheres.  CT angiogram of head and neck with no emergent findings.  Patient did not receive TPA.  Admission chemistries INR 2.9 hemoglobin A1c 6.1 chemistries unremarkable.  Echocardiogram with ejection fraction of 60% no wall motion abnormalities.  Chronic Coumadin ongoing with a goal INR of 3.0-3.5.  Tolerating regular diet.  Patient was admitted for a comprehensive rehab program.     Admit date: 07/04/2020 Discharge date: 07/16/2020  HPI   Pt states she had her first CVA at age 89, the patient lives at [place] at the time. She received outpatient rehabilitation but not with a neuro treater. Mod I dressing and bathing (sponge baths)  Mod I with cane outside of house No cane in her own home   No falls  Still getting OP PT, OT - gait balance and Left UE fine motor issues  Pain Inventory Average Pain 1 Pain Right Now 1 My pain is dull  In the last 24 hours, has pain interfered with the following? General activity 1 Relation with others 0 Enjoyment of life 0 What TIME of day is your pain at its worst? morning  Sleep (in general) Good  Pain is  worse with: standing and some activites Pain improves with: rest, heat/ice, therapy/exercise and pacing activities Relief from Meds: 5  Family History  Problem Relation Age of Onset  . Cancer Mother        Bone  . Heart disease Mother   . Hyperlipidemia Mother   . Hypertension Mother   . Stroke Father   . Hypertension Father   . Heart attack Neg Hx    Social History   Socioeconomic History  . Marital status: Married    Spouse name: Not on file  . Number of children: 2  . 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 Highest education level: Not on file  Occupational History  . Occupation: Retired  Tobacco Use  . Smoking status: Never Smoker  . Smokeless tobacco: Never Used  Vaping Use  . Vaping Use: Never used  Substance and Sexual Activity  . Alcohol use: No    Alcohol/week: 0.0 standard drinks  . Drug use: No  . Sexual activity: Not on file    Comment: Married  Other Topics Concern  . Not on file  Social History Narrative   Lives at home w/ her husband   Right-handed   Caffeine: none   Social Determinants of Health   Financial Resource Strain:   . Difficulty of Paying Living Expenses: Not on file  Food Insecurity: No Food Insecurity  . Worried About Charity fundraiser in the Last Year: Never true  . Ran Out of Food in the Last Year: Never true  Transportation Needs: No Transportation  Needs  . Lack of Transportation (Medical): No  . Lack of Transportation (Non-Medical): No  Physical Activity:   . Days of Exercise per Week: Not on file  . Minutes of Exercise per Session: Not on file  Stress:   . Feeling of Stress : Not on file  Social Connections:   . Frequency of Communication with Friends and Family: Not on file  . Frequency of Social Gatherings with Friends and Family: Not on file  . Attends Religious Services: Not on file  . Active Member of Clubs or Organizations: Not on file  . Attends Archivist Meetings: Not on file  . Marital Status:  Not on file   Past Surgical History:  Procedure Laterality Date  . ABDOMINAL HYSTERECTOMY    . BRAIN SURGERY    . BUNIONECTOMY  1993  . CARDIAC VALVE REPLACEMENT  1997  . TOE SURGERY  1996   Past Surgical History:  Procedure Laterality Date  . ABDOMINAL HYSTERECTOMY    . BRAIN SURGERY    . BUNIONECTOMY  1993  . CARDIAC VALVE REPLACEMENT  1997  . TOE SURGERY  1996   Past Medical History:  Diagnosis Date  . Abnormality of gait 09/07/2016  . Allergy   . Diabetes mellitus without complication [facility])    Patient denies this - notes history of glucose intolerance  . GERD (gastroesophageal reflux disease)   . Hemiparesis and alteration of sensations as late effects of stroke ([place]) 09/07/2016  . History of pneumonia 1997  . Hypertension   . S/P MVR (mitral valve replacement)    Mechanical mitral valve replacement at age 38 (done in [place])  // echo 7/17: EF 55-60, normal wall motion, bileaflet mechanical mitral valve prosthesis functioning normally, mild LAE, mildly reduced RVSF, small pericardial effusion  . Stroke ([place]) 1997, 2013, 2015   BP 120/75   Pulse 62   Temp 97.7 F (36.5 C)   Ht 5\' 7"  (1.702 m)   Wt 131 lb 3.2 oz (59.5 kg)   SpO2 96%   BMI 20.55 kg/m   Opioid Risk Score:   Fall Risk Score:  `1  Depression screen PHQ 2/9  Depression screen [facility] 2/9 09/12/2020 07/29/2020 07/18/2020 10/22/2019 09/25/2019  Decreased Interest 0 0 0 0 0  Down, Depressed, Hopeless 0 0 0 0 0  PHQ - 2 Score 0 0 0 0 0  Altered sleeping - 1 - - -  Tired, decreased energy - 0 - - -  Change in appetite - 0 - - -  Feeling bad or failure about yourself  - 0 - - -  Trouble concentrating - 0 - - -  Moving slowly or fidgety/restless - 0 - - -  Suicidal thoughts - 0 - - -  PHQ-9 Score - 1 - - -  Some recent data might be hidden    Review of Systems  Constitutional: Negative.   HENT: Negative.   Eyes: Negative.   Respiratory: Negative.   Cardiovascular: Negative.   Gastrointestinal:  Negative.   Endocrine: Negative.   Genitourinary: Negative.   Musculoskeletal:       Left leg  Skin: Negative.   Allergic/Immunologic: Negative.   Neurological: Negative.   Hematological: Bruises/bleeds easily.       Warfarin  Psychiatric/Behavioral: Negative.   All other systems reviewed and are negative.      Objective:   Physical Exam Constitutional:      Appearance: She is normal weight.  Eyes:     Extraocular  Movements: Extraocular movements intact.     Conjunctiva/sclera: Conjunctivae normal.     Pupils: Pupils are equal, round, and reactive to light.  Musculoskeletal:        General: No tenderness.     Right lower leg: No edema.     Left lower leg: No edema.     Comments: No pain with upper extremity or lower extremity range of motion No evidence of joint effusion in the hands or wrist or in the knees bilaterally.  Neurological:     Mental Status: She is alert and oriented to person, place, and time.     Cranial Nerves: Cranial nerves are intact.     Sensory: Sensory deficit present.     Motor: Weakness present. No tremor.     Gait: Gait abnormal.     Comments: Sensation reduced to light touch in the left hand  Psychiatric:        Mood and Affect: Mood normal.        Behavior: Behavior normal.   Motor strength is 4/5 in the left deltoid by stress of grip hip flexor knee extensor ankle dorsiflexor 5 -/5 on the right side Patient has dysmetria left finger-nose-finger testing        Assessment & Plan:  #39. 74 year old female with history of multiple strokes, we reviewed the location of her strokes the most symptomatic at this point remains the 2020 stroke in the right frontal cortex resulting in a chronic left hemiparesis We'll continue outpatient PT OT No driving Follow-up with physical medicine rehab in 3 months Follow-up with neurology as well as primary care physician

## 2020-09-12 NOTE — Patient Instructions (Signed)
May resume going to gym  Ok to resume massage

## 2020-09-12 NOTE — Therapy (Signed)
[facility] [address] [place], [place], [address] Phone: [phone]   Fax:  [phone]  Physical Therapy Treatment  Patient Details  Name: [name] MRN: [id number] Date of Birth: [birth year] Referring Provider (PT): Marlowe Shores, PA-C (Dr. Percell Miller for follow-up)   Encounter Date: 09/12/2020   PT End of Session - 09/12/20 1107    Visit Number 15    Number of Visits 17    Date for PT Re-Evaluation 58/30/94   90 day cert for 8 wk POC   Authorization Type Medicare/AARP    PT Start Time 1105    PT Stop Time 1143    PT Time Calculation (min) 38 min    Equipment Utilized During Treatment Gait belt;Other (comment)   L AFO   Activity Tolerance Patient tolerated treatment well    Behavior During Therapy WFL for tasks assessed/performed           Past Medical History:  Diagnosis Date  . Abnormality of gait 09/07/2016  . Allergy   . Diabetes mellitus without complication [facility])    Patient denies this - notes history of glucose intolerance  . GERD (gastroesophageal reflux disease)   . Hemiparesis and alteration of sensations as late effects of stroke ([place]) 09/07/2016  . History of pneumonia 1997  . Hypertension   . S/P MVR (mitral valve replacement)    Mechanical mitral valve replacement at age 63 (done in [place])  // echo 7/17: EF 55-60, normal wall motion, bileaflet mechanical mitral valve prosthesis functioning normally, mild LAE, mildly reduced RVSF, small pericardial effusion  . Stroke [facility]) 1997, 2013, 2015    Past Surgical History:  Procedure Laterality Date  . ABDOMINAL HYSTERECTOMY    . BRAIN SURGERY    . BUNIONECTOMY  1993  . CARDIAC VALVE REPLACEMENT  1997  . TOE SURGERY  1996    There were no vitals filed for this visit.   Subjective Assessment - 09/12/20 1107    Subjective Pt reports doing well. Has Dr. Letta Pate this afternoon.    Patient is accompained by: Family member    spouse, [name]   Patient Stated Goals Pt's goals for therapy are to get back to where I was before-walking more independently with cane.    Currently in Pain? No/denies    Pain Onset More than a month ago                             [facility] Adult PT Treatment/Exercise - 09/12/20 1109      Transfers   Transfers Sit to Stand;Stand to Sit    Sit to Stand 6: Modified independent (Device/Increase time)    Stand to Sit 6: Modified independent (Device/Increase time)      Ambulation/Gait   Ambulation/Gait Yes    Ambulation/Gait Assistance 6: Modified independent (Device/Increase time);5: Supervision    Ambulation/Gait Assistance Details around in clinic between activities.    Assistive device Straight cane    Gait Pattern Step-through pattern;Decreased stance time - left;Decreased hip/knee flexion - left    Ambulation Surface Level;Indoor      Neuro Re-ed    Neuro Re-ed Details  Pt ambulated 460' around in gym doing obstacle course each lap with SPC. Obstacle course included stepping over 4 small orange hurdles, tapping 4 cones with each foot then stepping over, then over blue mat CGA/occasional min assist. In // bars: standing on rockerboard positioned ant/post trying to maintain level  x 30 sec CGA then stepping on board with right foot and all the way over and back with left foot x 10 with 1 UE  support, then standing on board and alternating toe taps on cone x 10 each foot with 1 UE support. Standing on rockerboard positioned lateral trying to maintain level x 1 min with visual cues in mirror. CGA for safety with activities.                  PT Education - 09/12/20 1549    Education Details Discussed d/c plan for end of next week.    Person(s) Educated Patient;Spouse    Methods Explanation    Comprehension Verbalized understanding            PT Short Term Goals - 08/26/20 0950      PT SHORT TERM GOAL #1   Title Pt will be independent with HEP for improved  strength, balance, transfers, and gait.  TARGET 08/22/2020    Baseline 08/19/20: met with current program, will benefit from advancement as pt improves    Status Achieved      PT SHORT TERM GOAL #2   Title Pt will improve TUG score to less than or equal to 20 seconds for decreased fall risk.    Baseline 08/19/20: 16.1 sec's with cane    Status Achieved      PT SHORT TERM GOAL #3   Title Pt will improve BERG to at least 42/56 for decreased fall risk.    Baseline 08/19/20: 48/56 scored today    Status Achieved      PT SHORT TERM GOAL #4   Title Pt will improve 5x sit<>stand to less than or equal to 15 seconds for improved functional strength.    Baseline 08/19/20: 14.79 sec's no UE support from standard height surface    Status Achieved      PT SHORT TERM GOAL #5   Title Pt will ambulate at least 50 ft, indoor and household surfaces, no device, independently for improved household independence.    Baseline 08/19/20: met in session today    Status Achieved             PT Long Term Goals - 09/02/20 1324      PT LONG TERM GOAL #1   Title Pt will be independent with progression of HEP for improved strength, balance, transfers, and gait.  TARGET 09/19/2020    Time 8    Period Weeks    Status New      PT LONG TERM GOAL #2   Title Pt will improve TUG score to less than or equal to 13 seconds or less for pt to not be considered fall risk.    Baseline 14.75 sec with st. cane (08/26/20)    Time 8    Period Weeks    Status New    Target Date 09/19/20      PT LONG TERM GOAL #3   Title Pt will improve gait velocity to at least 2 ft/sec for improved gait efficiency and safety and decreased fall risk.    Baseline 1.4 ft/sec at eval; 1.25 ft/sec with st. cane 08/26/20    Time 8    Period Weeks    Status On-going      PT LONG TERM GOAL #4   Title Pt will ambulate at least 500 ft, indoors/outdoors, modified independently for improved community gait.    Time 8    Period Weeks  Status New      PT LONG TERM GOAL #5   Title Pt will improve FOTO score at discharge by at least 10% to demonstrate improved overall functional mobility.    Time 8    Period Weeks    Status New                 Plan - 09/12/20 1549    Clinical Impression Statement PT continued to work on dynamic gait and balance activities. Pt does get anxious at times with increasing height of object she needs to step over with LLE. Relies heavility on visual input to help as has poor proprioception in left leg. She is progressing well towards goals and will plan to d/c end of next week pending no significant changes.    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    Examination-Activity Limitations Locomotion Level;Transfers;Stairs;Stand    Examination-Participation Restrictions Community Activity;Other    Stability/Clinical Decision Making Evolving/Moderate complexity    Rehab Potential Good    PT Frequency 2x / week    PT Duration 8 weeks    PT Treatment/Interventions ADLs/Self Care Home Management;DME Instruction;Gait training;Stair training;Functional mobility training;Therapeutic activities;Therapeutic exercise;Balance training;Neuromuscular re-education;Patient/family education;Passive range of motion;Orthotic Fit/Training    PT Next Visit Plan Plan to discharge end of next week. Begin checking goals. Gait on varied surfaces with dynamic balance activities. Pt has poor sensation/proprioception in left leg.    Consulted and Agree with Plan of Care Patient;Family member/caregiver    Family Member Consulted husband           Patient will benefit from skilled therapeutic intervention in order to improve the following deficits and impairments:  Abnormal gait, Difficulty walking, Decreased range of motion, Impaired tone, Pain, Decreased balance, Decreased mobility, Decreased strength, Postural  dysfunction  Visit Diagnosis: Other abnormalities of gait and mobility  Muscle weakness (generalized)     Problem List Patient Active Problem List   Diagnosis Date Noted  . Goals of care, counseling/discussion   . Palliative care by specialist   . Acute CVA (cerebrovascular accident) ([place]) 07/01/2020  . 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 Anticoagulated on warfarin   . Left hemiparesis ([place])   . History of CVA with residual deficit   . Thrombocytopenia ([place])   . Prediabetes   . CVA (cerebral vascular accident) ([place]) 09/03/2019  . Vertigo 09/24/2017  . Benign paroxysmal positional vertigo   . History of stroke   . Hyperlipemia 08/23/2017  . Encounter for therapeutic drug monitoring 08/08/2017  . Long term (current) use of anticoagulants [Z79.01] 07/12/2017  . Hemiparesis and alteration of sensations as late effects of stroke ([place]) 09/07/2016  . Abnormality of gait 09/07/2016  . HTN (hypertension) 04/13/2016  . H/O mitral valve replacement with mechanical valve 10/16/2015  . Diabetes mellitus without complication ([place])   . 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 [place], [place], [address] Phone: [phone]   Fax:  [phone]  Name: [name] MRN: [id number] Date of Birth: Dec 09, 1945

## 2020-09-15 ENCOUNTER — Ambulatory Visit (INDEPENDENT_AMBULATORY_CARE_PROVIDER_SITE_OTHER): Payer: Medicare Other

## 2020-09-15 DIAGNOSIS — Z952 Presence of prosthetic heart valve: Secondary | ICD-10-CM | POA: Diagnosis not present

## 2020-09-15 DIAGNOSIS — Z5181 Encounter for therapeutic drug level monitoring: Secondary | ICD-10-CM | POA: Diagnosis not present

## 2020-09-15 DIAGNOSIS — Z7901 Long term (current) use of anticoagulants: Secondary | ICD-10-CM | POA: Diagnosis not present

## 2020-09-15 DIAGNOSIS — I69359 Hemiplegia and hemiparesis following cerebral infarction affecting unspecified side: Secondary | ICD-10-CM

## 2020-09-15 LAB — POCT INR: INR: 3.2 — AB (ref 2.0–3.0)

## 2020-09-15 NOTE — Patient Instructions (Signed)
Description   Spoke with patient and instructed her to continue same dosage 5mg  daily except 7.5mg  on Mondays and Fridays. Does weekly checks due to insurance. Recheck in 1 week. Coumadin Clinic: [phone] Patient to go back to 2 cups of romaine lettuce.

## 2020-09-16 ENCOUNTER — Ambulatory Visit: Payer: Medicare Other | Admitting: Occupational Therapy

## 2020-09-16 ENCOUNTER — Encounter: Payer: Self-pay | Admitting: Occupational Therapy

## 2020-09-16 ENCOUNTER — Ambulatory Visit: Payer: Medicare Other | Admitting: Physical Therapy

## 2020-09-16 ENCOUNTER — Other Ambulatory Visit: Payer: Self-pay

## 2020-09-16 DIAGNOSIS — R278 Other lack of coordination: Secondary | ICD-10-CM | POA: Diagnosis not present

## 2020-09-16 DIAGNOSIS — R29898 Other symptoms and signs involving the musculoskeletal system: Secondary | ICD-10-CM

## 2020-09-16 DIAGNOSIS — I69354 Hemiplegia and hemiparesis following cerebral infarction affecting left non-dominant side: Secondary | ICD-10-CM | POA: Diagnosis not present

## 2020-09-16 DIAGNOSIS — R2689 Other abnormalities of gait and mobility: Secondary | ICD-10-CM

## 2020-09-16 DIAGNOSIS — R293 Abnormal posture: Secondary | ICD-10-CM

## 2020-09-16 DIAGNOSIS — M6281 Muscle weakness (generalized): Secondary | ICD-10-CM

## 2020-09-16 DIAGNOSIS — R2681 Unsteadiness on feet: Secondary | ICD-10-CM | POA: Diagnosis not present

## 2020-09-16 DIAGNOSIS — R29818 Other symptoms and signs involving the nervous system: Secondary | ICD-10-CM

## 2020-09-16 NOTE — Therapy (Signed)
[facility] [address] [place], [place], [address] Phone: [phone]   Fax:  [phone]  Occupational Therapy Treatment  Patient Details  Name: [name] MRN: [id number] Date of Birth: 30-Jun-1946 Referring Provider (OT): Dr. Alysia Penna (for follow-up)   Encounter Date: 09/16/2020   OT End of Session - 09/16/20 0800    Visit Number 10    Number of Visits 11    Date for OT Re-Evaluation 09/10/20    Authorization Type Medicare/AARP, Medicare guidelines    Authorization Time Period Pt had 4 visits in January, KX at visit 15 total    Authorization - Visit Number 10    Authorization - Number of Visits 10    Progress Note Due on Visit 10    OT Start Time 0803    OT Stop Time 0845    OT Time Calculation (min) 42 min    Activity Tolerance Patient tolerated treatment well    Behavior During Therapy [facility] for tasks assessed/performed           Past Medical History:  Diagnosis Date  . Abnormality of gait 09/07/2016  . Allergy   . Diabetes mellitus without complication [facility])    Patient denies this - notes history of glucose intolerance  . GERD (gastroesophageal reflux disease)   . Hemiparesis and alteration of sensations as late effects of stroke ([place]) 09/07/2016  . History of pneumonia 1997  . Hypertension   . S/P MVR (mitral valve replacement)    Mechanical mitral valve replacement at age 56 (done in [place])  // echo 7/17: EF 55-60, normal wall motion, bileaflet mechanical mitral valve prosthesis functioning normally, mild LAE, mildly reduced RVSF, small pericardial effusion  . Stroke [facility]) 1997, 2013, 2015    Past Surgical History:  Procedure Laterality Date  . ABDOMINAL HYSTERECTOMY    . BRAIN SURGERY    . BUNIONECTOMY  1993  . CARDIAC VALVE REPLACEMENT  1997  . TOE SURGERY  1996    There were no vitals filed for this visit.   Subjective Assessment - 09/16/20 0808    Subjective  repotted some  flowers, used LUE to scoop dirt.   pt reports that she feels like she's using LUE more functionally    Pertinent History acute CVA, small L frontal lobe possible infarct; hospitalized 07/01/2020 and transferred to inpatient rehab, discharged home 07/16/2020.   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 No/denies             Cane exercises (LUE holding onto handle with neutral forearm) for shoulder flex, abduction, chest press, shoulder ext with scapular retraction (min facilitation), ER with min cueing for positioning for all.    Standing, functional reaching to flip large cards with wt. Shift, trunk rotation, supination, elbow ext, and finger extension with min cueing.    Functional reaching mid-high level to place washers on vertical pole with min difficulty for coordination and ROM.  Placing small pegs in pegboard for incr coordination with min-mod difficulty/incr time.  Assessed 9-hole peg test--see goal section below.            OT Long Term Goals - 09/16/20 0831      OT LONG TERM GOAL #1   Title  Pt and husband will be independent with updated HEP to address LUE coordination, strength, and spasticity management-- check LTGs  09/09/20    Time 5    Period Weeks    Status On-going      OT LONG TERM GOAL #2   Title Pt will decr spasticity to be able to ambulate at least 30 feet with only minimal elbow flexion and no shoulder hike of LUE.    Time 5    Period Weeks    Status On-going      OT LONG TERM GOAL #3   Title Pt will improve LUE coordination as shown by improving time on 9-hole peg test by at least 8sec.    Baseline 66.63sec    Time 5    Period Weeks    Status Not Met   09/16/20:  65.03sec     OT LONG TERM GOAL #4   Title Pt will demo at least -25* L elbow ext with  functional reach.    Baseline -35*    Time 5    Period Weeks    Status Achieved   09/02/20:  -10*     OT LONG TERM GOAL #5   Title Pt will improve functional reaching/coordination with LUE as shown by improving score on box and blocks test by at least 4 blocks.    Baseline L-27 blocks    Time 5    Period Weeks    Status On-going   24 blocks, 31 blocks- 09/12/20                Plan - 09/16/20 0801    Clinical Impression Statement Progress Note Reporting Period  08/04/20-09/16/20:  Pt is progressing with improved LUE functional use. She demo decr spasticity/synergy patterns with reach, incr control with functional reach.  However, pt would benefit from additional occupational therapy visit to check remaining goals and review recommendations.    Occupational performance deficits (Please refer to evaluation for details): ADL's;IADL's;Leisure;Social Participation    Body Structure / Function / Physical Skills ADL;Balance;Mobility;Tone;Strength;UE functional use;ROM;IADL;Dexterity;Coordination;FMC    OT Frequency 2x / week    OT Duration --   5 weeks   OT Treatment/Interventions Self-care/ADL training;Therapeutic exercise;Functional Mobility Training;Balance training;Manual Therapy;Neuromuscular education;Ultrasound;Energy conservation;Therapeutic activities;DME and/or AE instruction;Electrical Stimulation;Patient/family education;Passive range of motion;Moist Heat;Cryotherapy    Plan Review HEP/recommendations for check remaining goals and d/c next visit.    Consulted and Agree with Plan of Care Patient;Family member/caregiver    Family Member Consulted husband           Patient will benefit from skilled therapeutic intervention in order to improve the following deficits and impairments:   Body Structure / Function / Physical Skills: ADL, Balance, Mobility, Tone, Strength, UE functional use, ROM, IADL, Dexterity, Coordination, [facility]       Visit Diagnosis: Hemiplegia and hemiparesis  following cerebral infarction affecting left non-dominant side (HCC)  Other lack of coordination  Other symptoms and signs involving the nervous system  Other symptoms and signs involving the musculoskeletal system  Other abnormalities of gait and mobility  Abnormal posture    Problem List Patient Active Problem List   Diagnosis Date Noted  . Goals of care, counseling/discussion   . Palliative care by specialist   . Acute CVA (cerebrovascular accident) ([place]) 07/01/2020  . 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 Anticoagulated on warfarin   . Left hemiparesis ([place])   . History of CVA with residual deficit   . Thrombocytopenia ([place])   . Prediabetes   .  CVA (cerebral vascular accident) ([place]) 09/03/2019  . Vertigo 09/24/2017  . Benign paroxysmal positional vertigo   . History of stroke   . Hyperlipemia 08/23/2017  . Encounter for therapeutic drug monitoring 08/08/2017  . Long term (current) use of anticoagulants [Z79.01] 07/12/2017  . Hemiparesis and alteration of sensations as late effects of stroke ([place]) 09/07/2016  . Abnormality of gait 09/07/2016  . HTN (hypertension) 04/13/2016  . H/O mitral valve replacement with mechanical valve 10/16/2015  . Diabetes mellitus without complication ([place])   . Hemiparesis (L sided - mild) due to old stroke [facility])     [facility] 09/16/2020, 10:33 AM  [facility] [address] [place], [place], [address] Phone: [phone]   Fax:  [phone]  Name: [name] MRN: [id number] Date of Birth: [birth year]   Vianne Bulls, OTR/L [facility] [address] [place], [place]  [address] [phone] phone [phone] 09/16/20 10:33 AM

## 2020-09-16 NOTE — Therapy (Signed)
[facility] [address] [place], [place], [address] Phone: [phone]   Fax:  [phone]  Physical Therapy Treatment  Patient Details  Name: [name] MRN: [id number] Date of Birth: 11/06/1946 Referring Provider (PT): Marlowe Shores, PA-C (Dr. Percell Miller for follow-up)   Encounter Date: 09/16/2020   PT End of Session - 09/16/20 1815    Visit Number 16    Number of Visits 17    Date for PT Re-Evaluation 34/19/37   90 day cert for 8 wk POC   Authorization Type Medicare/AARP    PT Start Time 0931    PT Stop Time 1010    PT Time Calculation (min) 39 min    Equipment Utilized During Treatment Gait belt;Other (comment)   L AFO   Activity Tolerance Patient tolerated treatment well    Behavior During Therapy WFL for tasks assessed/performed           Past Medical History:  Diagnosis Date  . Abnormality of gait 09/07/2016  . Allergy   . Diabetes mellitus without complication [facility])    Patient denies this - notes history of glucose intolerance  . GERD (gastroesophageal reflux disease)   . Hemiparesis and alteration of sensations as late effects of stroke ([place]) 09/07/2016  . History of pneumonia 1997  . Hypertension   . S/P MVR (mitral valve replacement)    Mechanical mitral valve replacement at age 64 (done in [place])  // echo 7/17: EF 55-60, normal wall motion, bileaflet mechanical mitral valve prosthesis functioning normally, mild LAE, mildly reduced RVSF, small pericardial effusion  . Stroke [facility]) 1997, 2013, 2015    Past Surgical History:  Procedure Laterality Date  . ABDOMINAL HYSTERECTOMY    . BRAIN SURGERY    . BUNIONECTOMY  1993  . CARDIAC VALVE REPLACEMENT  1997  . TOE SURGERY  1996    There were no vitals filed for this visit.   Subjective Assessment - 09/16/20 0936    Subjective No changes, doing well.  No falls.    Patient is accompained by: Family member   spouse, [name]    Patient Stated Goals Pt's goals for therapy are to get back to where I was before-walking more independently with cane.    Currently in Pain? No/denies    Pain Onset More than a month ago                             [facility] Adult PT Treatment/Exercise - 09/16/20 0001      Transfers   Transfers Sit to Stand;Stand to Sit    Sit to Stand 6: Modified independent (Device/Increase time)    Stand to Sit 6: Modified independent (Device/Increase time)      Ambulation/Gait   Ambulation/Gait Yes    Ambulation/Gait Assistance 6: Modified independent (Device/Increase time);5: Supervision    Ambulation/Gait Assistance Details Variable surfaces in gym area, red/blue mats and stepping over obstacles, ramp/curb, simulating outdoor surfaces.    Ambulation Distance (Feet) 200 Feet   then 500 ft   Assistive device Straight cane    Gait Pattern Step-through pattern;Decreased stance time - left;Decreased hip/knee flexion - left    Ambulation Surface Level;Indoor    Gait velocity 16.6 sec = 2.98 ft/sec    Ramp 4: Min assist    Ramp Details (indicate cue type and reason) Cues for foot clearance, posture    Curb 4: Min assist    Curb Details (  indicate cue type and reason) x 1 with SPC at curb in clinic with HHA on LLE; additional curb practice x 4 on 6" aerobic step with cane only and min guard.    Gait Comments Gait with conversation tasks, no overt LOB noted, but slight slowing noted      Standardized Balance Assessment   Standardized Balance Assessment Timed Up and Go Test      Timed Up and Go Test   TUG Normal TUG    Normal TUG (seconds) 16.69      High Level Balance   High Level Balance Comments Step up/up, down/down on 4" aerobic step, then forward single leg step down (then return to start position on step), x 10 reps each leg, with RUE support at counter.                    PT Short Term Goals - 08/26/20 0950      PT SHORT TERM GOAL #1   Title Pt will be  independent with HEP for improved strength, balance, transfers, and gait.  TARGET 08/22/2020    Baseline 08/19/20: met with current program, will benefit from advancement as pt improves    Status Achieved      PT SHORT TERM GOAL #2   Title Pt will improve TUG score to less than or equal to 20 seconds for decreased fall risk.    Baseline 08/19/20: 16.1 sec's with cane    Status Achieved      PT SHORT TERM GOAL #3   Title Pt will improve BERG to at least 42/56 for decreased fall risk.    Baseline 08/19/20: 48/56 scored today    Status Achieved      PT SHORT TERM GOAL #4   Title Pt will improve 5x sit<>stand to less than or equal to 15 seconds for improved functional strength.    Baseline 08/19/20: 14.79 sec's no UE support from standard height surface    Status Achieved      PT SHORT TERM GOAL #5   Title Pt will ambulate at least 50 ft, indoor and household surfaces, no device, independently for improved household independence.    Baseline 08/19/20: met in session today    Status Achieved             PT Long Term Goals - 09/16/20 0958      PT LONG TERM GOAL #1   Title Pt will be independent with progression of HEP for improved strength, balance, transfers, and gait.  TARGET 09/19/2020    Time 8    Period Weeks    Status New      PT LONG TERM GOAL #2   Title Pt will improve TUG score to less than or equal to 13 seconds or less for pt to not be considered fall risk.    Baseline 14.75 sec with st. cane (08/26/20); 16.69 sec 09/16/2020    Time 8    Period Weeks    Status Not Met      PT LONG TERM GOAL #3   Title Pt will improve gait velocity to at least 2 ft/sec for improved gait efficiency and safety and decreased fall risk.    Baseline 1.4 ft/sec at eval; 1.25 ft/sec with st. cane 08/26/20; 1.98 ft/sec 09/16/2020    Time 8    Period Weeks    Status Achieved      PT LONG TERM GOAL #4   Title Pt will ambulate at least 500  ft, indoors/outdoors, modified independently for  improved community gait.    Time 8    Period Weeks    Status New      PT LONG TERM GOAL #5   Title Pt will improve FOTO score at discharge by at least 10% to demonstrate improved overall functional mobility.    Time 8    Period Weeks    Status New                 Plan - 09/16/20 1816    Clinical Impression Statement Began assessing LTGs, with pt not meeting LTG 2 for TUG.  Pt did meet LTG 3 for improved gait velocity.  Simulated outdoor surfaces with at least 500 ft of indoor gait (due to colder temps outside this morning).  Pt on target towards LTGs, with plans for d/c next visit.    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    Examination-Activity Limitations Locomotion Level;Transfers;Stairs;Stand    Examination-Participation Restrictions Community Activity;Other    Stability/Clinical Decision Making Evolving/Moderate complexity    Rehab Potential Good    PT Frequency 2x / week    PT Duration 8 weeks    PT Treatment/Interventions ADLs/Self Care Home Management;DME Instruction;Gait training;Stair training;Functional mobility training;Therapeutic activities;Therapeutic exercise;Balance training;Neuromuscular re-education;Patient/family education;Passive range of motion;Orthotic Fit/Training    PT Next Visit Plan Check remaining LTGs and plan for d/c next visit.    Consulted and Agree with Plan of Care Patient;Family member/caregiver    Family Member Consulted husband           Patient will benefit from skilled therapeutic intervention in order to improve the following deficits and impairments:  Abnormal gait, Difficulty walking, Decreased range of motion, Impaired tone, Pain, Decreased balance, Decreased mobility, Decreased strength, Postural dysfunction  Visit Diagnosis: Other abnormalities of gait and mobility  Muscle weakness (generalized)     Problem List Patient  Active Problem List   Diagnosis Date Noted  . Goals of care, counseling/discussion   . Palliative care by specialist   . Acute CVA (cerebrovascular accident) ([place]) 07/01/2020  . 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 Anticoagulated on warfarin   . Left hemiparesis ([place])   . History of CVA with residual deficit   . Thrombocytopenia ([place])   . Prediabetes   . CVA (cerebral vascular accident) ([place]) 09/03/2019  . Vertigo 09/24/2017  . Benign paroxysmal positional vertigo   . History of stroke   . Hyperlipemia 08/23/2017  . Encounter for therapeutic drug monitoring 08/08/2017  . Long term (current) use of anticoagulants [Z79.01] 07/12/2017  . Hemiparesis and alteration of sensations as late effects of stroke ([place]) 09/07/2016  . Abnormality of gait 09/07/2016  . HTN (hypertension) 04/13/2016  . H/O mitral valve replacement with mechanical valve 10/16/2015  . Diabetes mellitus without complication ([place])   . Hemiparesis (L sided - mild) due to old stroke ([place])     Aithan Farrelly W. 09/16/2020, 6:19 PM  Frazier Butt., PT   [facility] [address] [place], [place], [address] Phone: [phone]   Fax:  [phone]  Name: CLIFFIE GINGRAS MRN: [id number] Date of Birth: 08/05/1946

## 2020-09-18 ENCOUNTER — Other Ambulatory Visit: Payer: Self-pay

## 2020-09-18 ENCOUNTER — Encounter: Payer: Self-pay | Admitting: Physical Therapy

## 2020-09-18 ENCOUNTER — Ambulatory Visit: Payer: Medicare Other | Admitting: Physical Therapy

## 2020-09-18 DIAGNOSIS — R29818 Other symptoms and signs involving the nervous system: Secondary | ICD-10-CM | POA: Diagnosis not present

## 2020-09-18 DIAGNOSIS — R2681 Unsteadiness on feet: Secondary | ICD-10-CM | POA: Diagnosis not present

## 2020-09-18 DIAGNOSIS — M6281 Muscle weakness (generalized): Secondary | ICD-10-CM

## 2020-09-18 DIAGNOSIS — R2689 Other abnormalities of gait and mobility: Secondary | ICD-10-CM | POA: Diagnosis not present

## 2020-09-18 DIAGNOSIS — I69354 Hemiplegia and hemiparesis following cerebral infarction affecting left non-dominant side: Secondary | ICD-10-CM | POA: Diagnosis not present

## 2020-09-18 DIAGNOSIS — R278 Other lack of coordination: Secondary | ICD-10-CM | POA: Diagnosis not present

## 2020-09-18 DIAGNOSIS — R293 Abnormal posture: Secondary | ICD-10-CM

## 2020-09-18 DIAGNOSIS — R29898 Other symptoms and signs involving the musculoskeletal system: Secondary | ICD-10-CM | POA: Diagnosis not present

## 2020-09-18 NOTE — Patient Instructions (Signed)
Access Code: [email or web address] URL: [email or web address] Date: 09/18/2020 Prepared by: Willow Ora  Exercises Backward Walking with Counter Support - 1 x daily - 5 x weekly - 3-5 reps - 1 sets Side Stepping with Counter Support - 1 x daily - 5 x weekly - 3-5 reps - 1 sets Standing March with Unilateral Counter Support - 2 x daily - 6 x weekly - 10 reps - 3 sets Forward Step Up - 1 x daily - 5 x weekly - 10 reps - 1 sets Standing Balance with Eyes Closed - 1 x daily - 5 x weekly - 3 reps - 1 sets - 20 sec hold Sit to Stand without Arm Support - 2 x daily - 6 x weekly - 10 reps - 1-2 sets Standing Horizontal Saccades - 1 x daily - 5 x weekly - 10 reps - 1 sets Seated Vertical Saccades - 1 x daily - 5 x weekly - 10 reps - 1 sets Seated Gaze Stabilization with Head Rotation - 1 x daily - 5 x weekly - 3 reps - 1 sets - 20 hold Seated Gaze Stabilization with Head Nod - 1 x daily - 5 x weekly - 3 reps - 1 sets - 20 hold Bridge with Hip Abduction and Resistance - 1 x daily - 5 x weekly - 10 reps - 2 sets

## 2020-09-19 ENCOUNTER — Ambulatory Visit: Payer: Medicare Other | Admitting: Occupational Therapy

## 2020-09-19 DIAGNOSIS — R29898 Other symptoms and signs involving the musculoskeletal system: Secondary | ICD-10-CM

## 2020-09-19 DIAGNOSIS — R278 Other lack of coordination: Secondary | ICD-10-CM

## 2020-09-19 DIAGNOSIS — R29818 Other symptoms and signs involving the nervous system: Secondary | ICD-10-CM | POA: Diagnosis not present

## 2020-09-19 DIAGNOSIS — I69354 Hemiplegia and hemiparesis following cerebral infarction affecting left non-dominant side: Secondary | ICD-10-CM | POA: Diagnosis not present

## 2020-09-19 DIAGNOSIS — R2681 Unsteadiness on feet: Secondary | ICD-10-CM | POA: Diagnosis not present

## 2020-09-19 DIAGNOSIS — R2689 Other abnormalities of gait and mobility: Secondary | ICD-10-CM | POA: Diagnosis not present

## 2020-09-19 NOTE — Therapy (Signed)
[facility] [address] [place], [place], [address] Phone: [phone]   Fax:  [phone]  Occupational Therapy Treatment  Patient Details  Name: [name] MRN: [id number] Date of Birth: 04-Sep-1946 Referring Provider (OT): Dr. Alysia Penna (for follow-up)   Encounter Date: 09/19/2020   OT End of Session - 09/19/20 0852    Visit Number 11    Number of Visits 11    Authorization Type Medicare/AARP, Medicare guidelines    Authorization - Visit Number 11    OT Start Time 0851    OT Stop Time 0930    OT Time Calculation (min) 39 min           Past Medical History:  Diagnosis Date  . Abnormality of gait 09/07/2016  . Allergy   . Diabetes mellitus without complication [facility])    Patient denies this - notes history of glucose intolerance  . GERD (gastroesophageal reflux disease)   . Hemiparesis and alteration of sensations as late effects of stroke ([place]) 09/07/2016  . History of pneumonia 1997  . Hypertension   . S/P MVR (mitral valve replacement)    Mechanical mitral valve replacement at age 74 (done in [place])  // echo 7/17: EF 55-60, normal wall motion, bileaflet mechanical mitral valve prosthesis functioning normally, mild LAE, mildly reduced RVSF, small pericardial effusion  . Stroke [facility]) 1997, 2013, 2015    Past Surgical History:  Procedure Laterality Date  . ABDOMINAL HYSTERECTOMY    . BRAIN SURGERY    . BUNIONECTOMY  1993  . CARDIAC VALVE REPLACEMENT  1997  . TOE SURGERY  1996    There were no vitals filed for this visit.   Subjective Assessment - 09/19/20 0851    Subjective  Pt reports mild foot pain    Pertinent History acute CVA, small L frontal lobe possible infarct; hospitalized 07/01/2020 and transferred to inpatient rehab, discharged home 07/16/2020.   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 Yes    Pain Score 1     Pain Location Leg    Pain Orientation Left    Pain Descriptors / Indicators Aching    Pain Type Chronic pain    Pain Onset More than a month ago    Pain Frequency Intermittent    Aggravating Factors  unsure    Pain Relieving Factors tylenol                  Treatment: Therapist checked progress towards goals and reviewed HEP.              OT Education - 09/19/20 0905    Education Details Cane exercises shoulder flexion, chest press, and shoulder abduction and reviewed weightbearing though LUE while reaching with RUE, min v.c, checked progress towards goals. Pt verbalized understanding of HEP.    Person(s) Educated Patient;Spouse    Methods Explanation;Demonstration;Handout;Verbal cues    Comprehension Verbalized understanding;Returned demonstration;Verbal cues required               OT Long Term Goals - 09/19/20 0904      OT LONG TERM GOAL #1   Title Pt and husband will be independent with updated HEP to address LUE coordination, strength, and spasticity management-- check LTGs  09/09/20    Time  5    Period Weeks    Status Achieved      OT LONG TERM GOAL #2   Title Pt will decr spasticity to be able to ambulate at least 30 feet with only minimal elbow flexion and no shoulder hike of LUE.    Time 5    Period Weeks    Status Achieved      OT LONG TERM GOAL #3   Title Pt will improve LUE coordination as shown by improving time on 9-hole peg test by at least 8sec.    Baseline 66.63sec    Time 5    Period Weeks    Status Not Met   09/16/20:  65.03sec     OT LONG TERM GOAL #4   Title Pt will demo at least -25* L elbow ext with functional reach.    Baseline -35*    Time 5    Period Weeks    Status Achieved   09/02/20:  -10*     OT LONG TERM GOAL #5   Title Pt will improve functional reaching/coordination with LUE as shown by improving score on box  and blocks test by at least 4 blocks.    Baseline L-27 blocks    Time 5    Period Weeks    Status Not Met   29                Plan - 09/19/20 1337    Clinical Impression Statement Pt demonstrates good overall progress. She agrees with plans for d/c from OT.    Occupational performance deficits (Please refer to evaluation for details): ADL's;IADL's;Leisure;Social Participation    Body Structure / Function / Physical Skills ADL;Balance;Mobility;Tone;Strength;UE functional use;ROM;IADL;Dexterity;Coordination;FMC    OT Frequency 2x / week    OT Duration --   5 weeks   OT Treatment/Interventions Self-care/ADL training;Therapeutic exercise;Functional Mobility Training;Balance training;Manual Therapy;Neuromuscular education;Ultrasound;Energy conservation;Therapeutic activities;DME and/or AE instruction;Electrical Stimulation;Patient/family education;Passive range of motion;Moist Heat;Cryotherapy    Plan d/c OT    Consulted and Agree with Plan of Care Patient;Family member/caregiver    Family Member Consulted husband           Patient will benefit from skilled therapeutic intervention in order to improve the following deficits and impairments:   Body Structure / Function / Physical Skills: ADL, Balance, Mobility, Tone, Strength, UE functional use, ROM, IADL, Dexterity, Coordination, [place]       Visit Diagnosis: Hemiplegia and hemiparesis following cerebral infarction affecting left non-dominant side (HCC)  Other lack of coordination  Other symptoms and signs involving the nervous system  Other symptoms and signs involving the musculoskeletal system  Other abnormalities of gait and mobility   OCCUPATIONAL THERAPY DISCHARGE SUMMARY    Current functional level related to goals / functional outcomes: Patient made good overall progress, yet she did not fully achieve all goals due to length of time since onset.   Remaining deficits:spaticity, decreased coordination, decreased  LUE functional use   Education / Equipment: Patient was educated regarding HEP. She verbalized understanding. Plan: Patient agrees to discharge.  Patient goals were not met. Patient is being discharged due to being pleased with the current functional level.  ?????      Problem List Patient Active Problem List   Diagnosis Date Noted  . Goals of care, counseling/discussion   . Palliative care by specialist   . Acute CVA (cerebrovascular accident) ([place]) 07/01/2020  . 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 Anticoagulated on warfarin   . Left hemiparesis ([place])   .  History of CVA with residual deficit   . Thrombocytopenia ([place])   . Prediabetes   . CVA (cerebral vascular accident) ([place]) 09/03/2019  . Vertigo 09/24/2017  . Benign paroxysmal positional vertigo   . History of stroke   . Hyperlipemia 08/23/2017  . Encounter for therapeutic drug monitoring 08/08/2017  . Long term (current) use of anticoagulants [Z79.01] 07/12/2017  . Hemiparesis and alteration of sensations as late effects of stroke ([place]) 09/07/2016  . Abnormality of gait 09/07/2016  . HTN (hypertension) 04/13/2016  . H/O mitral valve replacement with mechanical valve 10/16/2015  . Diabetes mellitus without complication ([place])   . Hemiparesis (L sided - mild) due to old stroke ([place])     , 09/19/2020, 1:40 PM Theone Murdoch, OTR/L [phone] Phone: [phone] 1:40 PM 09/19/20 [facility] [address] [place], [place], [address] Phone: [phone]   Fax:  [phone]  Name: QUETZALI HEINLE MRN: [id number] Date of Birth: 1946/08/17

## 2020-09-19 NOTE — Therapy (Signed)
[facility] [address] [place], [place], [address] Phone: [phone]   Fax:  [phone]  Physical Therapy Treatment  Patient Details  Name: [name] MRN: [id number] Date of Birth: 18-Nov-1945 Referring Provider (PT): Marlowe Shores, PA-C (Dr. Percell Miller for follow-up)   Encounter Date: 09/18/2020   PT End of Session - 09/18/20 0854    Visit Number 17    Number of Visits 17    Date for PT Re-Evaluation 29/92/42   90 day cert for 8 wk POC   Authorization Type Medicare/AARP    PT Start Time 0848    PT Stop Time 0926    PT Time Calculation (min) 38 min    Equipment Utilized During Treatment Gait belt;Other (comment)   L AFO   Activity Tolerance Patient tolerated treatment well    Behavior During Therapy WFL for tasks assessed/performed           Past Medical History:  Diagnosis Date  . Abnormality of gait 09/07/2016  . Allergy   . Diabetes mellitus without complication [facility])    Patient denies this - notes history of glucose intolerance  . GERD (gastroesophageal reflux disease)   . Hemiparesis and alteration of sensations as late effects of stroke ([place]) 09/07/2016  . History of pneumonia 1997  . Hypertension   . S/P MVR (mitral valve replacement)    Mechanical mitral valve replacement at age 13 (done in [place])  // echo 7/17: EF 55-60, normal wall motion, bileaflet mechanical mitral valve prosthesis functioning normally, mild LAE, mildly reduced RVSF, small pericardial effusion  . Stroke [facility]) 1997, 2013, 2015    Past Surgical History:  Procedure Laterality Date  . ABDOMINAL HYSTERECTOMY    . BRAIN SURGERY    . BUNIONECTOMY  1993  . CARDIAC VALVE REPLACEMENT  1997  . TOE SURGERY  1996    There were no vitals filed for this visit.   Subjective Assessment - 09/18/20 0853    Subjective No new complaitns. No falls or pain.    Patient is accompained by: Family member   spouse   Patient  Stated Goals Pt's goals for therapy are to get back to where I was before-walking more independently with cane.    Currently in Pain? No/denies    Pain Score 0-No pain              [facility] Adult PT Treatment/Exercise - 09/18/20 0855      Transfers   Transfers Sit to Stand;Stand to Sit    Sit to Stand 6: Modified independent (Device/Increase time)    Stand to Sit 6: Modified independent (Device/Increase time)      Ambulation/Gait   Ambulation/Gait Yes    Ambulation/Gait Assistance 6: Modified independent (Device/Increase time);5: Supervision    Ambulation Distance (Feet) 500 Feet   x1, plus around gym with session   Assistive device Straight cane    Gait Pattern Step-through pattern;Decreased stance time - left;Decreased hip/knee flexion - left    Ambulation Surface Level;Unlevel;Indoor;Outdoor;Paved;Grass    Curb 4: Min assist    Curb Details (indicate cue type and reason) x1 on outdoor curb with cane      Self-Care   Self-Care Other Self-Care Comments    Other Self-Care Comments  Administered FOTO questionaire with pt scoring Physical FS score of 60 (improved from 50 at eval).          Reviewed and revised HEP. Issued the following to HEP today. Access Code: [email or web address] URL:  [email or web address] Date: 09/18/2020 Prepared by: Willow Ora  Exercises Backward Walking with Counter Support - 1 x daily - 5 x weekly - 3-5 reps - 1 sets Side Stepping with Counter Support - 1 x daily - 5 x weekly - 3-5 reps - 1 sets Standing March with Unilateral Counter Support - 2 x daily - 6 x weekly - 10 reps - 3 sets Forward Step Up - 1 x daily - 5 x weekly - 10 reps - 1 sets Standing Balance with Eyes Closed - 1 x daily - 5 x weekly - 3 reps - 1 sets - 20 sec hold Sit to Stand without Arm Support - 2 x daily - 6 x weekly - 10 reps - 1-2 sets Standing Horizontal Saccades - 1 x daily - 5 x weekly - 10 reps - 1 sets Seated Vertical Saccades - 1 x daily - 5 x weekly - 10 reps - 1  sets Seated Gaze Stabilization with Head Rotation - 1 x daily - 5 x weekly - 3 reps - 1 sets - 20 hold Seated Gaze Stabilization with Head Nod - 1 x daily - 5 x weekly - 3 reps - 1 sets - 20 hold Bridge with Hip Abduction and Resistance - 1 x daily - 5 x weekly - 10 reps - 2 sets          PT Short Term Goals - 08/26/20 0950      PT SHORT TERM GOAL #1   Title Pt will be independent with HEP for improved strength, balance, transfers, and gait.  TARGET 08/22/2020    Baseline 08/19/20: met with current program, will benefit from advancement as pt improves    Status Achieved      PT SHORT TERM GOAL #2   Title Pt will improve TUG score to less than or equal to 20 seconds for decreased fall risk.    Baseline 08/19/20: 16.1 sec's with cane    Status Achieved      PT SHORT TERM GOAL #3   Title Pt will improve BERG to at least 42/56 for decreased fall risk.    Baseline 08/19/20: 48/56 scored today    Status Achieved      PT SHORT TERM GOAL #4   Title Pt will improve 5x sit<>stand to less than or equal to 15 seconds for improved functional strength.    Baseline 08/19/20: 14.79 sec's no UE support from standard height surface    Status Achieved      PT SHORT TERM GOAL #5   Title Pt will ambulate at least 50 ft, indoor and household surfaces, no device, independently for improved household independence.    Baseline 08/19/20: met in session today    Status Achieved             PT Long Term Goals - 09/18/20 1635      PT LONG TERM GOAL #1   Title Pt will be independent with progression of HEP for improved strength, balance, transfers, and gait.  TARGET 09/19/2020    Baseline 09/17/20: met today in session with revised program    Status Achieved      PT LONG TERM GOAL #2   Title Pt will improve TUG score to less than or equal to 13 seconds or less for pt to not be considered fall risk.    Baseline 09/16/20: 16.69 sec    Status Not Met      PT LONG TERM GOAL #3   Title Pt  will  improve gait velocity to at least 2 ft/sec for improved gait efficiency and safety and decreased fall risk.    Baseline 09/16/20" 1.98 ft/sec    Status Achieved      PT LONG TERM GOAL #4   Title Pt will ambulate at least 500 ft, indoors/outdoors, modified independently for improved community gait.    Baseline 09/18/20: met in session today    Status Achieved      PT LONG TERM GOAL #5   Title Pt will improve FOTO score at discharge by at least 10% to demonstrate improved overall functional mobility.    Baseline 09/18/20: met today with score of 60 (was 50).    Status Achieved                 Plan - 09/18/20 0855    Clinical Impression Statement Pt met remaining LTGs this session. Agrees to discharge today with no questions or issues.    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    Examination-Activity Limitations Locomotion Level;Transfers;Stairs;Stand    Examination-Participation Restrictions Community Activity;Other    Stability/Clinical Decision Making Evolving/Moderate complexity    Rehab Potential Good    PT Frequency 2x / week    PT Duration 8 weeks    PT Treatment/Interventions ADLs/Self Care Home Management;DME Instruction;Gait training;Stair training;Functional mobility training;Therapeutic activities;Therapeutic exercise;Balance training;Neuromuscular re-education;Patient/family education;Passive range of motion;Orthotic Fit/Training    PT Next Visit Plan discharge this visit    PT Home Exercise Plan Access Code: [email or web address]    Consulted and Agree with Plan of Care Patient;Family member/caregiver    Family Member Consulted husband           Patient will benefit from skilled therapeutic intervention in order to improve the following deficits and impairments:  Abnormal gait, Difficulty walking, Decreased range of motion, Impaired tone, Pain, Decreased balance, Decreased  mobility, Decreased strength, Postural dysfunction  Visit Diagnosis: Hemiplegia and hemiparesis following cerebral infarction affecting left non-dominant side (HCC)  Other abnormalities of gait and mobility  Muscle weakness (generalized)  Abnormal posture     Problem List Patient Active Problem List   Diagnosis Date Noted  . Goals of care, counseling/discussion   . Palliative care by specialist   . Acute CVA (cerebrovascular accident) ([place]) 07/01/2020  . 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 Anticoagulated on warfarin   . Left hemiparesis ([place])   . History of CVA with residual deficit   . Thrombocytopenia ([place])   . Prediabetes   . CVA (cerebral vascular accident) ([place]) 09/03/2019  . Vertigo 09/24/2017  . Benign paroxysmal positional vertigo   . History of stroke   . Hyperlipemia 08/23/2017  . Encounter for therapeutic drug monitoring 08/08/2017  . Long term (current) use of anticoagulants [Z79.01] 07/12/2017  . Hemiparesis and alteration of sensations as late effects of stroke ([place]) 09/07/2016  . Abnormality of gait 09/07/2016  . HTN (hypertension) 04/13/2016  . H/O mitral valve replacement with mechanical valve 10/16/2015  . Diabetes mellitus without complication ([place])   . Hemiparesis (L sided - mild) due to old stroke ([place])     Willow Ora, PTA, [facility] [address], [place], [place] [address] [phone] 09/19/20, 4:38 PM   Name: [name] MRN: [id number] Date of Birth: Mar 19, 1946

## 2020-09-22 ENCOUNTER — Telehealth: Payer: Self-pay | Admitting: Physician Assistant

## 2020-09-22 ENCOUNTER — Ambulatory Visit (INDEPENDENT_AMBULATORY_CARE_PROVIDER_SITE_OTHER): Payer: Medicare Other | Admitting: *Deleted

## 2020-09-22 DIAGNOSIS — Z5181 Encounter for therapeutic drug level monitoring: Secondary | ICD-10-CM

## 2020-09-22 DIAGNOSIS — Z952 Presence of prosthetic heart valve: Secondary | ICD-10-CM | POA: Diagnosis not present

## 2020-09-22 DIAGNOSIS — Z7901 Long term (current) use of anticoagulants: Secondary | ICD-10-CM

## 2020-09-22 DIAGNOSIS — I69359 Hemiplegia and hemiparesis following cerebral infarction affecting unspecified side: Secondary | ICD-10-CM | POA: Diagnosis not present

## 2020-09-22 LAB — POCT INR: INR: 4.2 — AB (ref 2.0–3.0)

## 2020-09-22 NOTE — Patient Instructions (Signed)
Description   Spoke with patient and instructed her to hold today's dose then continue same dosage 5mg  daily except 7.5mg  on Mondays and Fridays. Does weekly checks due to insurance. Recheck in 1 week. [facility]: [phone] Patient to continue 2 cups of romaine lettuce.

## 2020-09-22 NOTE — Telephone Encounter (Signed)
New message:      [name] from [facility] INR is out of range. Please call back

## 2020-09-22 NOTE — Telephone Encounter (Signed)
Please see anticoag encounter from today for further documentation.  

## 2020-09-29 ENCOUNTER — Ambulatory Visit (INDEPENDENT_AMBULATORY_CARE_PROVIDER_SITE_OTHER): Payer: Medicare Other | Admitting: Pharmacist

## 2020-09-29 DIAGNOSIS — Z7901 Long term (current) use of anticoagulants: Secondary | ICD-10-CM | POA: Diagnosis not present

## 2020-09-29 DIAGNOSIS — Z952 Presence of prosthetic heart valve: Secondary | ICD-10-CM | POA: Diagnosis not present

## 2020-09-29 DIAGNOSIS — I69359 Hemiplegia and hemiparesis following cerebral infarction affecting unspecified side: Secondary | ICD-10-CM

## 2020-09-29 DIAGNOSIS — Z5181 Encounter for therapeutic drug level monitoring: Secondary | ICD-10-CM | POA: Diagnosis not present

## 2020-09-29 LAB — POCT INR: INR: 2.6 (ref 2.0–3.0)

## 2020-10-06 ENCOUNTER — Ambulatory Visit (INDEPENDENT_AMBULATORY_CARE_PROVIDER_SITE_OTHER): Payer: Medicare Other | Admitting: Cardiology

## 2020-10-06 DIAGNOSIS — Z5181 Encounter for therapeutic drug level monitoring: Secondary | ICD-10-CM

## 2020-10-06 DIAGNOSIS — Z952 Presence of prosthetic heart valve: Secondary | ICD-10-CM | POA: Diagnosis not present

## 2020-10-06 DIAGNOSIS — Z7901 Long term (current) use of anticoagulants: Secondary | ICD-10-CM | POA: Diagnosis not present

## 2020-10-06 LAB — POCT INR: INR: 4 — AB (ref 2.0–3.0)

## 2020-10-06 NOTE — Patient Instructions (Signed)
Description   Spoke with patient and instructed her to take 1/2 a tablet (2.5mg ) today  then continue to take 1 tablet  (5mg ) daily except for 1.5 tablets (7.5mg ) on Monday and Fridays. Eat an extra serving of Romaine lettuce today and then continue eating 2 cups of Romaine on Fridays. Recheck INR in 1 week. ( Self tester).

## 2020-10-08 DIAGNOSIS — Z7901 Long term (current) use of anticoagulants: Secondary | ICD-10-CM | POA: Diagnosis not present

## 2020-10-08 DIAGNOSIS — I129 Hypertensive chronic kidney disease with stage 1 through stage 4 chronic kidney disease, or unspecified chronic kidney disease: Secondary | ICD-10-CM | POA: Diagnosis not present

## 2020-10-08 DIAGNOSIS — N182 Chronic kidney disease, stage 2 (mild): Secondary | ICD-10-CM | POA: Diagnosis not present

## 2020-10-08 DIAGNOSIS — M810 Age-related osteoporosis without current pathological fracture: Secondary | ICD-10-CM | POA: Diagnosis not present

## 2020-10-08 DIAGNOSIS — E7800 Pure hypercholesterolemia, unspecified: Secondary | ICD-10-CM | POA: Diagnosis not present

## 2020-10-08 DIAGNOSIS — R2689 Other abnormalities of gait and mobility: Secondary | ICD-10-CM | POA: Diagnosis not present

## 2020-10-08 DIAGNOSIS — Z952 Presence of prosthetic heart valve: Secondary | ICD-10-CM | POA: Diagnosis not present

## 2020-10-08 DIAGNOSIS — I679 Cerebrovascular disease, unspecified: Secondary | ICD-10-CM | POA: Diagnosis not present

## 2020-10-08 DIAGNOSIS — R7301 Impaired fasting glucose: Secondary | ICD-10-CM | POA: Diagnosis not present

## 2020-10-08 DIAGNOSIS — I69998 Other sequelae following unspecified cerebrovascular disease: Secondary | ICD-10-CM | POA: Diagnosis not present

## 2020-10-08 DIAGNOSIS — G8194 Hemiplegia, unspecified affecting left nondominant side: Secondary | ICD-10-CM | POA: Diagnosis not present

## 2020-10-08 DIAGNOSIS — D692 Other nonthrombocytopenic purpura: Secondary | ICD-10-CM | POA: Diagnosis not present

## 2020-10-13 ENCOUNTER — Ambulatory Visit (INDEPENDENT_AMBULATORY_CARE_PROVIDER_SITE_OTHER): Payer: Medicare Other

## 2020-10-13 DIAGNOSIS — Z952 Presence of prosthetic heart valve: Secondary | ICD-10-CM | POA: Diagnosis not present

## 2020-10-13 DIAGNOSIS — Z7901 Long term (current) use of anticoagulants: Secondary | ICD-10-CM | POA: Diagnosis not present

## 2020-10-13 DIAGNOSIS — Z5181 Encounter for therapeutic drug level monitoring: Secondary | ICD-10-CM

## 2020-10-13 DIAGNOSIS — I69359 Hemiplegia and hemiparesis following cerebral infarction affecting unspecified side: Secondary | ICD-10-CM | POA: Diagnosis not present

## 2020-10-13 LAB — POCT INR: INR: 3.4 — AB (ref 2.0–3.0)

## 2020-10-13 NOTE — Patient Instructions (Signed)
Description   Spoke with patient and instructed her to continue to take 1 tablet  (5mg ) daily except for 1.5 tablets (7.5mg ) on Mondays and Fridays. Continue eating 2 cups of Romaine on Fridays. Recheck INR in 1 week. ( Self tester).

## 2020-10-20 ENCOUNTER — Ambulatory Visit (INDEPENDENT_AMBULATORY_CARE_PROVIDER_SITE_OTHER): Payer: Medicare Other | Admitting: *Deleted

## 2020-10-20 DIAGNOSIS — Z7901 Long term (current) use of anticoagulants: Secondary | ICD-10-CM

## 2020-10-20 DIAGNOSIS — Z952 Presence of prosthetic heart valve: Secondary | ICD-10-CM | POA: Diagnosis not present

## 2020-10-20 DIAGNOSIS — Z5181 Encounter for therapeutic drug level monitoring: Secondary | ICD-10-CM | POA: Diagnosis not present

## 2020-10-20 DIAGNOSIS — I69359 Hemiplegia and hemiparesis following cerebral infarction affecting unspecified side: Secondary | ICD-10-CM | POA: Diagnosis not present

## 2020-10-20 LAB — POCT INR: INR: 3.3 — AB (ref 2.0–3.0)

## 2020-10-20 NOTE — Patient Instructions (Signed)
Description   Spoke with patient and instructed her to continue taking 1 tablet (5mg ) daily except for 1.5 tablets (7.5mg ) on Mondays and Fridays. Continue eating 2 cups of Romaine on Fridays. Recheck INR in 1 week. ( Self tester).

## 2020-10-27 ENCOUNTER — Ambulatory Visit (INDEPENDENT_AMBULATORY_CARE_PROVIDER_SITE_OTHER): Payer: Medicare Other | Admitting: Pharmacist

## 2020-10-27 DIAGNOSIS — Z952 Presence of prosthetic heart valve: Secondary | ICD-10-CM | POA: Diagnosis not present

## 2020-10-27 DIAGNOSIS — Z7901 Long term (current) use of anticoagulants: Secondary | ICD-10-CM | POA: Diagnosis not present

## 2020-10-27 DIAGNOSIS — I69359 Hemiplegia and hemiparesis following cerebral infarction affecting unspecified side: Secondary | ICD-10-CM | POA: Diagnosis not present

## 2020-10-27 DIAGNOSIS — Z5181 Encounter for therapeutic drug level monitoring: Secondary | ICD-10-CM | POA: Diagnosis not present

## 2020-10-27 LAB — POCT INR: INR: 3.8 — AB (ref 2.0–3.0)

## 2020-10-29 DIAGNOSIS — N3001 Acute cystitis with hematuria: Secondary | ICD-10-CM | POA: Diagnosis not present

## 2020-11-03 ENCOUNTER — Ambulatory Visit (INDEPENDENT_AMBULATORY_CARE_PROVIDER_SITE_OTHER): Payer: Medicare Other | Admitting: *Deleted

## 2020-11-03 DIAGNOSIS — I69359 Hemiplegia and hemiparesis following cerebral infarction affecting unspecified side: Secondary | ICD-10-CM | POA: Diagnosis not present

## 2020-11-03 DIAGNOSIS — Z5181 Encounter for therapeutic drug level monitoring: Secondary | ICD-10-CM

## 2020-11-03 DIAGNOSIS — Z952 Presence of prosthetic heart valve: Secondary | ICD-10-CM

## 2020-11-03 DIAGNOSIS — Z7901 Long term (current) use of anticoagulants: Secondary | ICD-10-CM | POA: Diagnosis not present

## 2020-11-03 LAB — POCT INR: INR: 2.7 (ref 2.0–3.0)

## 2020-11-03 NOTE — Patient Instructions (Addendum)
Description   Spoke with patient and instructed her to take 2 tablets (10mg) today then continue taking 1 tablet (5mg) daily except for 1.5 tablets (7.5mg) on Mondays and Fridays. Continue eating 2 cups of Romaine on Fridays. Recheck INR in 1 week. ( Self tester).     

## 2020-11-05 ENCOUNTER — Telehealth: Payer: Self-pay | Admitting: Cardiology

## 2020-11-05 NOTE — Telephone Encounter (Signed)
Pt called with BP systolic 150s which is elevated for her.  She is anxious due to her hx of Strokes.   I asked her to go ahead and take her evening amlodipine 5 mg now.   She should call back 12.30/21 with update and she would like to be seen next week.

## 2020-11-06 NOTE — Telephone Encounter (Signed)
Called patient back about message. Informed patient that her BP is looking better. Patient stated she has an appointment with Dr. Elease Hashimoto on Tuesday. Encouraged her to keep that appointment. Patient verbalized understanding.

## 2020-11-06 NOTE — Telephone Encounter (Signed)
   Pt c/o BP issue: STAT if pt c/o blurred vision, one-sided weakness or slurred speech  1. What are your last 5 BP readings?  11/05/20 8:00 pm: 151/86 HR 88 11/06/20 8:45 am: 134/75 HR 83    2. Are you having any other symptoms (ex. Dizziness, headache, blurred vision, passed out)? Blurry vision, but patient is sitting down now   3. What is your BP issue?   Patient called back to report her BP to follow up from her call to the on call provider from last night.

## 2020-11-09 ENCOUNTER — Encounter: Payer: Self-pay | Admitting: Cardiovascular Disease

## 2020-11-09 NOTE — Progress Notes (Signed)
Cardiology Office Note   Date:  11/11/2020   ID:  [name], DOB 1946-01-23, MRN [id number]  PCP:  Gaspar Garbe, MD  Cardiologist:   Kristeen Miss, MD   Chief Complaint  Patient presents with  . Hypertension   Problem List 1. Mitral valve replacement  - at age 75.  2. Essential HTN 3. Hyperlipidemia 4. DM.  5.  Multiple strokes   [name] is a 75 y.o. female who presents for management of her mitral valve replacement . Was seen with her husband [name].  Moved from [place], [place] recently  Has had 4 strokes and a TIA.  She does her own INR monitoring  - the strokes were not associated with marked INR variations Exercises some, not as much as when she lived in [place].   Does water aerobics.  Walks on occasion .  Also does physical therapy exercises.   April 13, 2016:  Doing well.  INRs have been OK. ( 3.2)   She measures her own.  Last week the INR levels were off when she was on Abx for a UTI .   Oct. 4, 2017:  Doing well.  S/p MVR 1997.   INR levels are ok , checks it at home and in our office  2. 9 last week   February 28, 2018: [name] is seen today for follow-up of her mitral valve replacement and hypertension.  She was last seen in our office in October, 2019 by Gabrielle Leyden,  NP . INR has been theraputuc because of her hx of strokes.  Goal INR is 3.0-3.5   April 09, 2020:   [name] is seen today for follow-up visit regarding her mitral valve replacement.  She was last seen in the office approximately 2 years ago. She had a stroke in October, 2020.Marland Kitchen  She has had strokes in the past.  Has occasional episodes of chest tightness.  Occurs spontaneously , not exertional  Some of the episodes tend to occur with mental stress.  She does 30 min of exercise in the am.  Does not have CP in the morning .   Sept. 14, 2021: [name] is seen today for follow up of her MV replacement, HTN She has had several strokes. Has had some chest pain  Was in the hospital  Aug. 24 - Sept 8 for another stroke.  Her INR goal is 3.0 - 3.5  Levels  Were slightly low at time .   Jan. 4, 2022: [name] is seen today for follow up of her MV replacement and multiple strokes. Her goal INR is 3.0 - 3.5, she is also on ASA 81 mg  BP has been variable . Seems to be a bit high at times.  Might be eating more salt than she  Has some palpitation  We discussed possibly starting her on plavix and coumadin instead ov ASA and coumadin   She has not had any bleeding issues.     Past Medical History:  Diagnosis Date  . Abnormality of gait 09/07/2016  . Allergy   . Diabetes mellitus without complication [facility])    Patient denies this - notes history of glucose intolerance  . GERD (gastroesophageal reflux disease)   . 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) 09/07/2016  . History of pneumonia 1997  . Hypertension   . S/P MVR (mitral valve replacement)    Mechanical mitral valve replacement at age 63 (done in [place])  // echo 7/17:  EF 55-60, normal wall motion, bileaflet mechanical mitral valve prosthesis functioning normally, mild LAE, mildly reduced RVSF, small pericardial effusion  . Stroke [facility]) 1997, 2013, 2015    Past Surgical History:  Procedure Laterality Date  . ABDOMINAL HYSTERECTOMY    . BRAIN SURGERY    . BUNIONECTOMY  1993  . CARDIAC VALVE REPLACEMENT  1997  . TOE SURGERY  1996     Current Outpatient Medications  Medication Sig Dispense Refill  . acetaminophen (TYLENOL) 325 MG tablet Take 2 tablets (650 mg total) by mouth every 4 (four) hours as needed for mild pain (or temp > 37.5 C (99.5 F)).    Marland Kitchen amLODipine (NORVASC) 5 MG tablet Take 1 tablet (5 mg total) by mouth at bedtime. 30 tablet 0  . aspirin EC 81 MG tablet Take 81 mg by mouth daily.     Marland Kitchen atorvastatin (LIPITOR) 20 MG tablet Take 20 mg by mouth daily.    . calcium carbonate 1250 MG capsule Take 1,200 mg by mouth at bedtime.     . cholecalciferol (VITAMIN D)  1000 UNITS tablet Take 2,000 Units daily by mouth.     . denosumab (PROLIA) 60 MG/ML SOSY injection Inject 60 mg into the skin every 6 (six) months.    . docusate sodium (COLACE) 100 MG capsule Take 100 mg at bedtime by mouth.     . Menthol, Topical Analgesic, (BENGAY EX) Apply 1 application topically daily. All painful areas    . pantoprazole (PROTONIX) 40 MG tablet Take 1 tablet (40 mg total) by mouth daily. 30 tablet 1  . Propylene Glycol-Glycerin (SOOTHE) 0.6-0.6 % SOLN Place 1 drop into both eyes 2 (two) times daily as needed (dry eyes).    . warfarin (COUMADIN) 5 MG tablet Take 1-1.5 tablets (5-7.5 mg total) by mouth daily. Take 7.5mg  (one and a half tablets) on Monday and Friday; Take 5mg  (1 tablet) on all other days. OR AS DIRECTED PER COUMADIN CLINIC 45 tablet 0   No current facility-administered medications for this visit.    Allergies:   Zoloft [sertraline]    Social History:  The patient  reports that she has never smoked. She has never used smokeless tobacco. She reports that she does not drink alcohol and does not use drugs.   Family History:  The patient's family history includes Cancer in her mother; Heart disease in her mother; Hyperlipidemia in her mother; Hypertension in her father and mother; Stroke in her father.    ROS:   Noted in current history, otherwise review of systems is negative.   Physical Exam: Blood pressure 124/80, pulse 92, height 5\' 7"  (1.702 m), weight 129 lb 9.6 oz (58.8 kg), SpO2 97 %.  GEN:  Thin, chronically ill appearing female  HEENT: Normal NECK: No JVD; No carotid bruits LYMPHATICS: No lymphadenopathy CARDIAC: RRR, mechanical S1,   RESPIRATORY:  Clear to auscultation without rales, wheezing or rhonchi  ABDOMEN: Soft, non-tender, non-distended MUSCULOSKELETAL:  No edema; No deformity  SKIN: Warm and dry NEUROLOGIC:  Alert and oriented x 3    EKG:      Recent Labs: 07/07/2020: ALT 23; BUN 28; Creatinine, Ser 0.80; Potassium 4.0;  Sodium 140 07/14/2020: Hemoglobin 12.8; Platelets 176    Lipid Panel    Component Value Date/Time   CHOL 148 07/02/2020 0500   TRIG 82 07/02/2020 0500   HDL 57 07/02/2020 0500   CHOLHDL 2.6 07/02/2020 0500   VLDL 16 07/02/2020 0500   LDLCALC 75 07/02/2020 0500  Wt Readings from Last 3 Encounters:  11/11/20 129 lb 9.6 oz (58.8 kg)  09/12/20 131 lb 3.2 oz (59.5 kg)  08/05/20 131 lb (59.4 kg)      Other studies Reviewed: Additional studies/ records that were reviewed today include:  Review of the above records demonstrates:    ASSESSMENT AND PLAN:  1.   Mitral valve replacement:;  1997    -she seems to be doing better. She had another stroke in August.  Her INR goal has been gradually increased.  She is currently on aspirin and Coumadin.  I wonder if having her on Coumadin plus Plavix might reduce her risk of strokes.  I will defer to neurology to see what they think about that.    2. Hypertension:      Blood pressure is fairly well controlled.  She notes that her blood pressure somewhat variable.  She still eats a fair amount of salt in the form of canned foods and tomato sauce, pizza sauce.  Recommended that she reduce the intake of canned foods and salty foods.  3. Frequent falls:     4.  Chest pain:     Current medicines are reviewed at length with the patient today.  The patient does not have concerns regarding medicines.  The following changes have been made:  no change  Labs/ tests ordered today include:   No orders of the defined types were placed in this encounter.    Disposition:         Mertie Moores, MD  11/11/2020 5:51 PM    [address], [place], [place]  [address] Phone: [phone]; Fax: [phone]

## 2020-11-10 ENCOUNTER — Ambulatory Visit (INDEPENDENT_AMBULATORY_CARE_PROVIDER_SITE_OTHER): Payer: Medicare Other | Admitting: Pharmacist

## 2020-11-10 DIAGNOSIS — Z5181 Encounter for therapeutic drug level monitoring: Secondary | ICD-10-CM | POA: Diagnosis not present

## 2020-11-10 DIAGNOSIS — Z952 Presence of prosthetic heart valve: Secondary | ICD-10-CM | POA: Diagnosis not present

## 2020-11-10 DIAGNOSIS — Z7901 Long term (current) use of anticoagulants: Secondary | ICD-10-CM

## 2020-11-10 DIAGNOSIS — H43812 Vitreous degeneration, left eye: Secondary | ICD-10-CM | POA: Diagnosis not present

## 2020-11-10 DIAGNOSIS — I69359 Hemiplegia and hemiparesis following cerebral infarction affecting unspecified side: Secondary | ICD-10-CM

## 2020-11-10 LAB — POCT INR: INR: 3.9 — AB (ref 2.0–3.0)

## 2020-11-10 NOTE — Patient Instructions (Signed)
Description   Spoke with patient and instructed her to take 2 tablets (10mg) today then continue taking 1 tablet (5mg) daily except for 1.5 tablets (7.5mg) on Mondays and Fridays. Continue eating 2 cups of Romaine on Fridays. Recheck INR in 1 week. ( Self tester).     

## 2020-11-11 ENCOUNTER — Telehealth: Payer: Self-pay | Admitting: *Deleted

## 2020-11-11 ENCOUNTER — Ambulatory Visit (INDEPENDENT_AMBULATORY_CARE_PROVIDER_SITE_OTHER): Payer: Medicare Other | Admitting: Cardiovascular Disease

## 2020-11-11 ENCOUNTER — Other Ambulatory Visit: Payer: Self-pay

## 2020-11-11 ENCOUNTER — Encounter: Payer: Self-pay | Admitting: Cardiovascular Disease

## 2020-11-11 ENCOUNTER — Telehealth: Payer: Medicare Other | Admitting: Physician Assistant

## 2020-11-11 VITALS — BP 124/80 | HR 92 | Ht 67.0 in | Wt 129.6 lb

## 2020-11-11 DIAGNOSIS — Z952 Presence of prosthetic heart valve: Secondary | ICD-10-CM | POA: Diagnosis not present

## 2020-11-11 NOTE — Patient Instructions (Signed)
Medication Instructions:  Your provider recommends that you continue on your current medications as directed. Please refer to the Current Medication list given to you today.   *If you need a refill on your cardiac medications before your next appointment, please call your pharmacy*  Follow-Up: At [facility], you and your health needs are our priority.  As part of our continuing mission to provide you with exceptional heart care, we have created designated Provider Care Teams.  These Care Teams include your primary Cardiologist (physician) and Advanced Practice Providers (APPs -  Physician Assistants and Nurse Practitioners) who all work together to provide you with the care you need, when you need it. Your next appointment:   6 month(s) The format for your next appointment:   In Person Provider:   You will see one of the following Advanced Practice Providers on your designated Care Team:    Scott Weaver, PA-C  Vin Bhagat, PA-C 

## 2020-11-11 NOTE — Telephone Encounter (Signed)
Mrs Alcaide called to get the letter for her massages. I have printed letter and had Dr Wynn Banker to sign. It will be placed up front for her to pick up.

## 2020-11-12 DIAGNOSIS — N3001 Acute cystitis with hematuria: Secondary | ICD-10-CM | POA: Diagnosis not present

## 2020-11-12 DIAGNOSIS — R310 Gross hematuria: Secondary | ICD-10-CM | POA: Diagnosis not present

## 2020-11-12 DIAGNOSIS — R3915 Urgency of urination: Secondary | ICD-10-CM | POA: Diagnosis not present

## 2020-11-12 NOTE — Progress Notes (Signed)
I agree with the above plan 

## 2020-11-17 ENCOUNTER — Ambulatory Visit (INDEPENDENT_AMBULATORY_CARE_PROVIDER_SITE_OTHER): Payer: Medicare Other | Admitting: Internal Medicine

## 2020-11-17 DIAGNOSIS — Z5181 Encounter for therapeutic drug level monitoring: Secondary | ICD-10-CM

## 2020-11-17 LAB — POCT INR: INR: 2.9 (ref 2.0–3.0)

## 2020-11-17 IMAGING — CT CT ANGIO NECK
1 of 11 series · 13 of 46 positions shown, 18 images · IV contrast (omnipaque)
Comparison: Brain MRI from yesterday.  CTA head neck 09/04/2019

CLINICAL DATA: Dizziness and weakness. History of stroke with
residual left-sided weakness.



[Series 10: thin · axial · 0.42mm/px · z∈[-270,+36]mm · 13 of 659 slices shown, 18 images]
[im 42/659  soft-tissue]
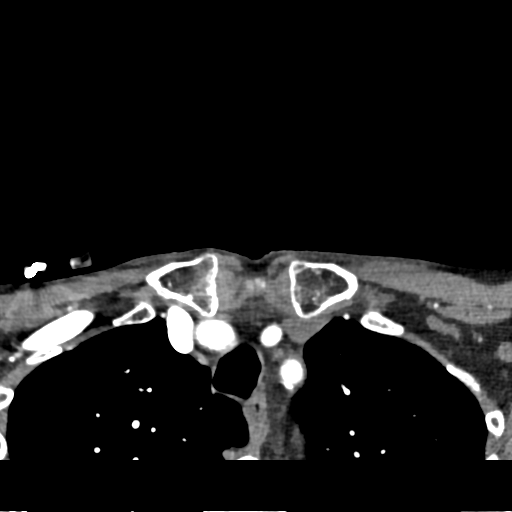
[im 42/659  bone]
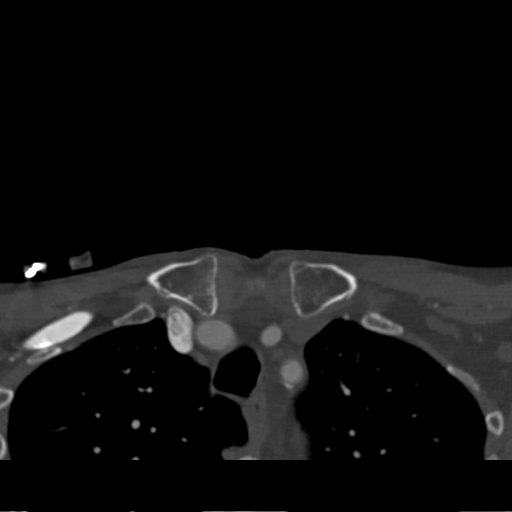
[im 83/659  soft-tissue]
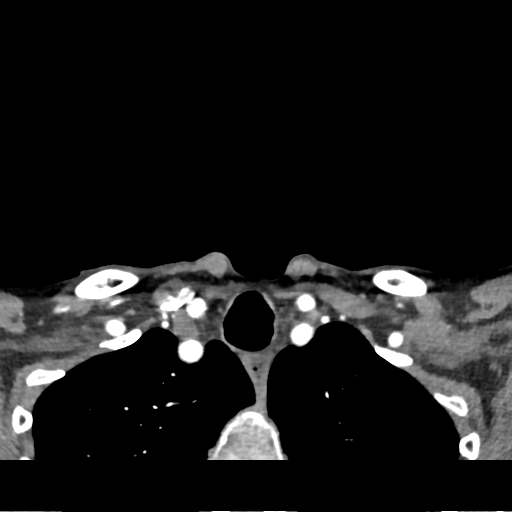
[im 165/659  soft-tissue]
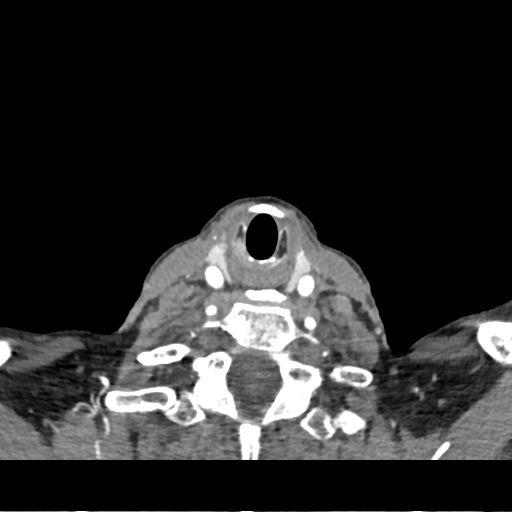
[im 206/659  soft-tissue]
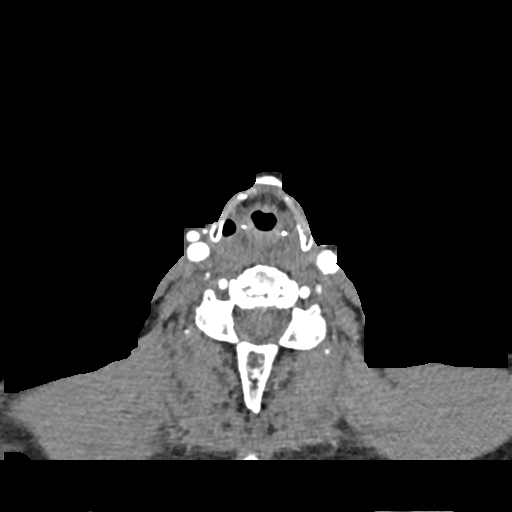
[im 247/659  soft-tissue]
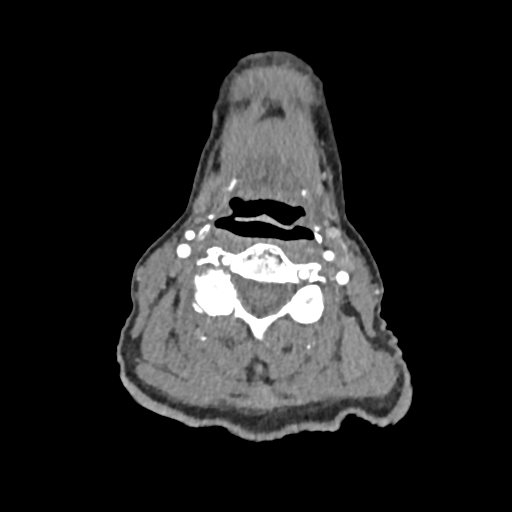
[im 288/659  soft-tissue]
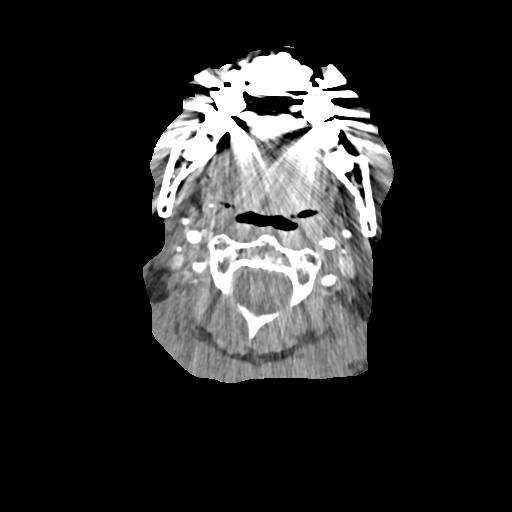
[im 371/659  soft-tissue]
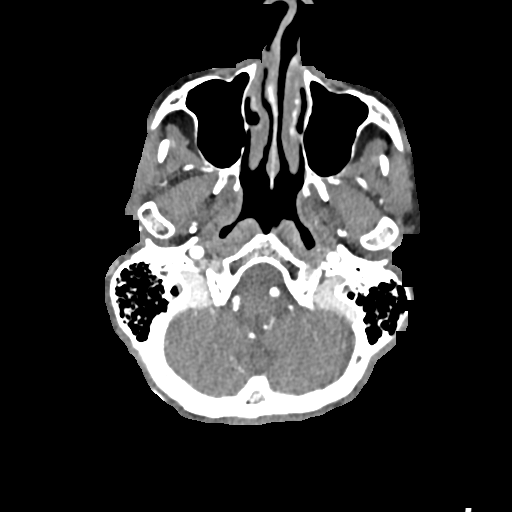
[im 412/659  soft-tissue]
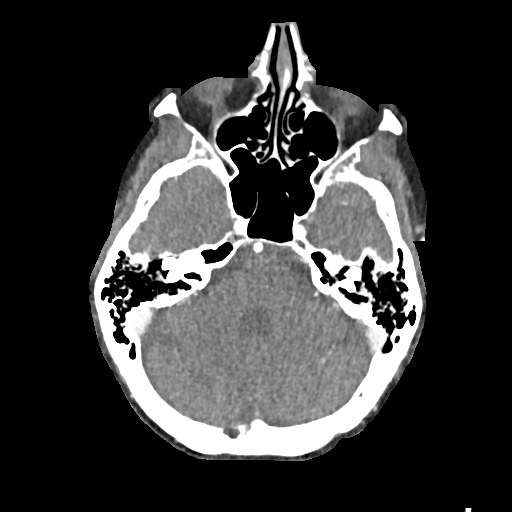
[im 453/659  soft-tissue]
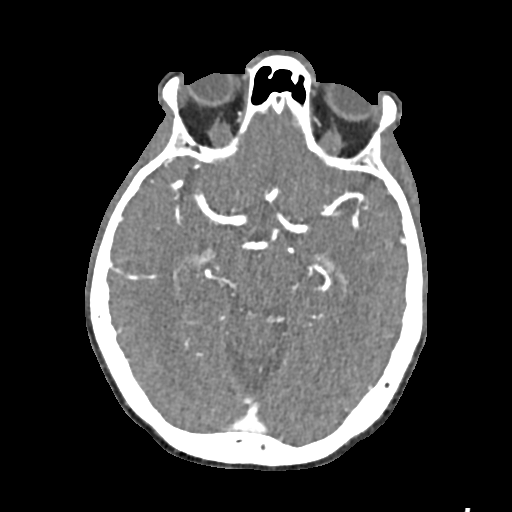
[im 453/659  bone]
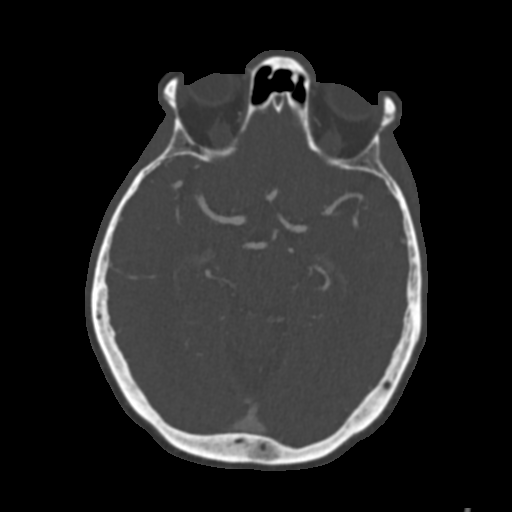
[im 494/659  soft-tissue]
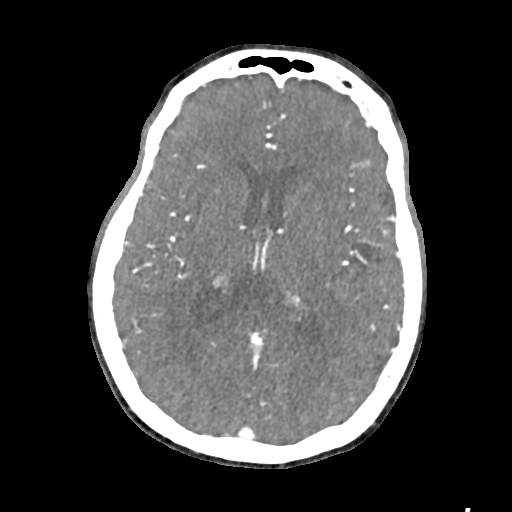
[im 494/659  lung]
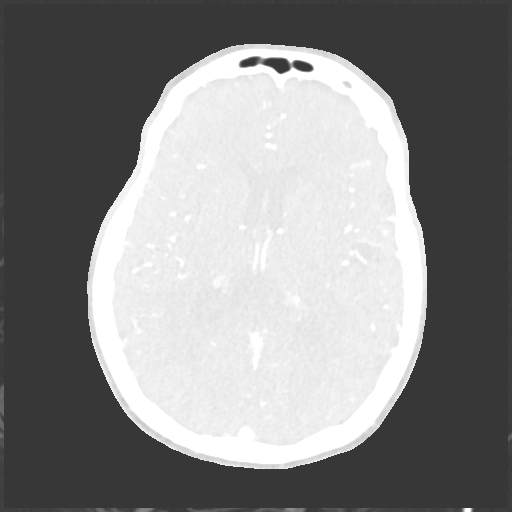
[im 535/659  lung]
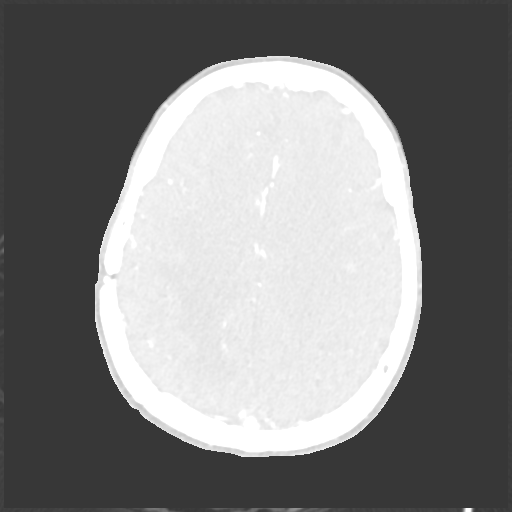
[im 576/659  soft-tissue]
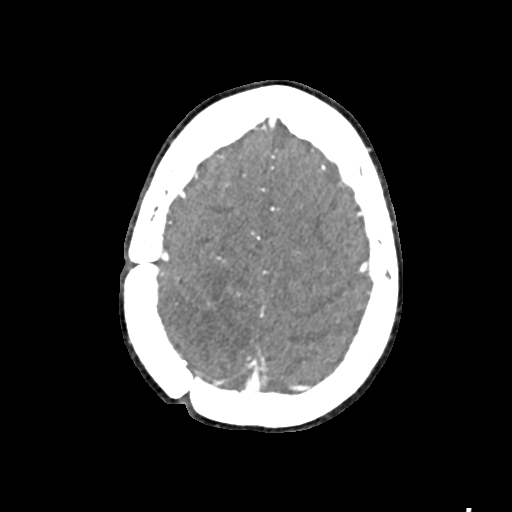
[im 576/659  lung]
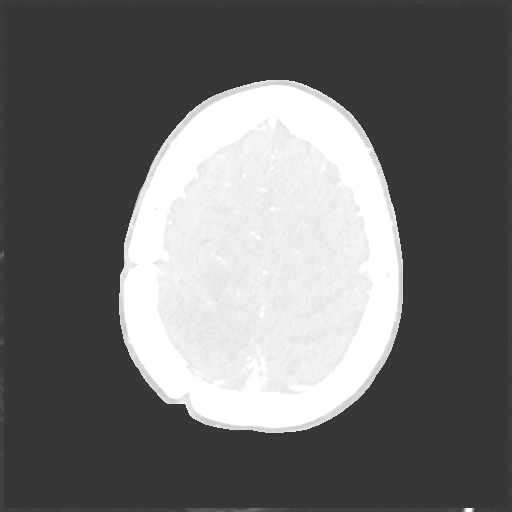
[im 617/659  soft-tissue]
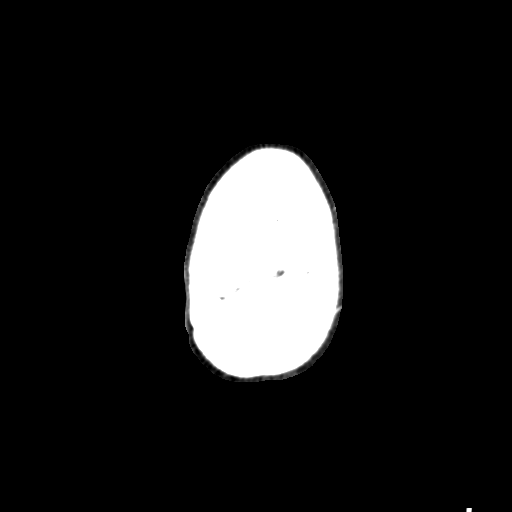
[im 617/659  lung]
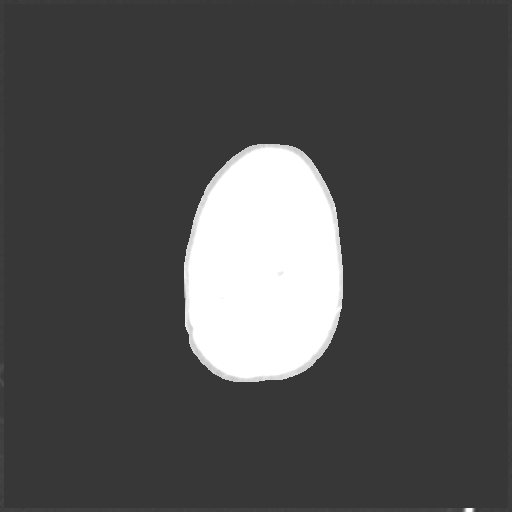

[13 of 46 positions shown; findings below may reference images not displayed]

FINDINGS: CT HEAD FINDINGS

Brain: The patient's left frontal acute infarct is not readily
identified by noncontrast CT. Chronic encephalomalacia and
high-density nodule centered in the right parietal lobe, deep to a
craniotomy. There is chronic small vessel ischemia including a
remote lacunar infarct at the left thalamus. Right more than left
small cerebellar infarcts. No acute hemorrhage, hydrocephalus, or
masslike finding

Vascular: See below

Skull: Unremarkable right parietal craniotomy. No destructive
findings

Sinuses: Clear

Orbits: Negative

CTA NECK FINDINGS

Aortic arch: Limited coverage of the arch which shows no dilatation
or acute finding.

Right carotid system: Mild atheromatous changes at the proximal ICA.
No dissection or beading.

Left carotid system: Mild atheromatous plaque at the carotid
bifurcation. No stenosis, ulceration, or beading.

Vertebral arteries: Proximal subclavian atherosclerosis without flow
limiting stenosis or ulceration. The vertebral arteries are smooth
and widely patent to the dura.

Skeleton: Cervical disc and facet degeneration. No acute or
aggressive finding

Other neck: No significant incidental finding.

Upper chest: Negative

Review of the MIP images confirms the above findings

CTA HEAD FINDINGS

Anterior circulation: Less than typical atheromatous changes on the
siphons. No branch occlusion, beading, or aneurysm.

Posterior circulation: Vertebral and basilar arteries are smooth and
widely patent. Unremarkable cerebellar branching. Hypoplastic left
P1 segment.

Venous sinuses: Diffusely patent

Anatomic variants: As above

Review of the MIP images confirms the above findings
IMPRESSION: 1. No emergent finding.
2. Mild atherosclerosis without flow limiting stenosis or embolic
source identified in the head and neck.
3. Intracranial findings as described on preceding brain MRI.

## 2020-11-17 NOTE — Patient Instructions (Signed)
Description   Spoke with patient and instructed her to take 2 tablets (10mg ) today then continue taking 1 tablet (5mg ) daily except for 1.5 tablets (7.5mg ) on Mondays and Fridays. Continue eating 2 cups of Romaine on Fridays. Recheck INR in 1 week. ( Self tester).

## 2020-11-25 ENCOUNTER — Ambulatory Visit (INDEPENDENT_AMBULATORY_CARE_PROVIDER_SITE_OTHER): Payer: Medicare Other | Admitting: Pharmacist

## 2020-11-25 DIAGNOSIS — Z7901 Long term (current) use of anticoagulants: Secondary | ICD-10-CM

## 2020-11-25 DIAGNOSIS — Z5181 Encounter for therapeutic drug level monitoring: Secondary | ICD-10-CM

## 2020-11-25 DIAGNOSIS — I69359 Hemiplegia and hemiparesis following cerebral infarction affecting unspecified side: Secondary | ICD-10-CM

## 2020-11-25 DIAGNOSIS — Z952 Presence of prosthetic heart valve: Secondary | ICD-10-CM | POA: Diagnosis not present

## 2020-11-25 LAB — POCT INR: INR: 3.5 — AB (ref 2.0–3.0)

## 2020-11-25 NOTE — Patient Instructions (Signed)
Description   Continue taking 1 tablet (5mg ) daily except for 1.5 tablets (7.5mg ) on Mondays and Fridays. Continue eating 2 cups of Romaine on Fridays. Recheck INR in 1 week. ( Self tester).

## 2020-12-01 ENCOUNTER — Ambulatory Visit (INDEPENDENT_AMBULATORY_CARE_PROVIDER_SITE_OTHER): Payer: Medicare Other | Admitting: Pharmacist

## 2020-12-01 DIAGNOSIS — Z952 Presence of prosthetic heart valve: Secondary | ICD-10-CM | POA: Diagnosis not present

## 2020-12-01 DIAGNOSIS — Z7901 Long term (current) use of anticoagulants: Secondary | ICD-10-CM | POA: Diagnosis not present

## 2020-12-01 DIAGNOSIS — Z5181 Encounter for therapeutic drug level monitoring: Secondary | ICD-10-CM

## 2020-12-01 DIAGNOSIS — I69359 Hemiplegia and hemiparesis following cerebral infarction affecting unspecified side: Secondary | ICD-10-CM

## 2020-12-01 LAB — POCT INR: INR: 2.2 (ref 2.0–3.0)

## 2020-12-01 NOTE — Patient Instructions (Signed)
Description   Take 2 tablets today and tomorrow then continue taking 1 tablet (5mg ) daily except for 1.5 tablets (7.5mg ) on Mondays and Fridays. Continue eating 2 cups of Romaine on Fridays. Recheck INR in 1 week. ( Self tester).

## 2020-12-02 NOTE — Progress Notes (Signed)
[facility] [address] [place]. [place] [address] [phone]       STROKE FOLLOW UP NOTE  [name] Date of Birth:  [birth year] Medical Record Number:  [id number]   Reason for Referral: stroke follow up    CHIEF COMPLAINT:  Chief Complaint  Patient presents with  . Follow-up    Stroke f/u    HPI:  Today, 12/03/2020, Ms. Mcdill returns for 79-month stroke follow-up unaccompanied.  Doing well from a stroke standpoint without new stroke/TIA symptoms and reports residual left sided impairment which has been stable without worsening.   She has remained on warfarin and aspirin 81 mg daily without bleeding or bruising but with fluctuation of INR levels. She monitors at home every Monday and follows with coumadin clinic and cardiology. Remains on atorvastatin without myalgias.  Blood pressure today 119/77. No further concerns at this time.    History provided for reference purposes only Update 08/05/2020 JM: Ms. BECKA HIPKE is a 75 y.o. 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  Intermittent lightheadedness/dizziness possibly BPPV with prior history of BPPV 2018.  Recommend continuation of aspirin and warfarin with INR goal 3.0-3.5 with INR on admission 2.9.  HTN stable.  LDL 75 and increase atorvastatin to 40 mg daily.  History of multiple prior strokes as listed below.  Evaluated by therapies and discharged to CIR for comprehensive rehab program. Since discharge, she reports increased spasticity of LUE and weakness in LLE and decline in ambulation.  She experienced  similar to the setback after stroke extension in 08/2019.  She continues to work with neuro rehab PT and started OT yesterday.  She declines right-sided deficits.  No reoccurring dizziness for BPPV type symptoms.  Currently using a cane for ambulation and denies any recent falls. INR levels have been flucuating with recent INR level 3.1.  She does monitor at home - Dr. Cathie Olden and Coumadin clinic manages levels.  Remains on warfarin and aspirin without bleeding or bruising.  Remains on atorvastatin without myalgias.  Blood pressure today 115/69.  No concerns at this time.  Update 01/28/2020 JM: Ms. Mcnicol is a 75 year old female who is being seen today, 01/28/2020, for stroke follow-up.  She has been stable from a stroke standpoint with stable chronic left hemiparesis and subjective lack of sensation LLE.  She continues to work with neuro rehab PT with ongoing benefit.  Continues to use a cane outdoors and use of AFO brace but ambulates without AD in her own home without falls.  Denies new or worsening stroke/TIA symptoms.  Continues on aspirin and warfarin without bleeding or bruising with recent INR level satisfactory at 3.1 with goal 3-3.5.  Continues on atorvastatin without myalgias.  Blood pressure today 118/70.  No concerns at this time.  Initial visit 10/22/2019 JM: Ms. Stalbaum is a 75 year old female who is being seen today for hospital follow-up.  She continues to have mild left hemiparesis but has been experiencing issues with left hip pain and therapy believes possibly left hip bursitis and plans on evaluation by physical medicine rehab Dr. Letta Pate this Friday, 10/26/2019.  She continues to work with PT/OT with  ongoing improvement.  Continues on aspirin and Coumadin without bleeding or bruising.  INR previously stable with levels remaining within goal range but today 2.9 with adjustment to Coumadin doses and ongoing follow-up with Coumadin clinic.  Continues on atorvastatin without myalgias.  Glucose levels  stable.  Blood pressure today 102/71.  No further concerns   Stroke admission 09/03/2019: Ms. ALYHA MARINES is a 75 y.o. female with history of multiple strokess s/p craniotomy in the past presented on 09/03/2019 with increasedhemiparesis of the left arm and leg.  Stroke work-up showed acute extension along the anterior margin of old infarct with unknown etiology possibly failure of collaterals and history of mechanical heart valve on long-term AC and INR suboptimal at 2.1. MRI does confirm acute extension along the anterior margin of old infarction right parietal cortical and subcortical brain. Per Dr. Leonie Man during admission,"it is possible she had a transient hypoperfusion event as a new embolic event to the same distribution as the previous stroke would be unlikely.. The strokes are relatively small, and given the high risk of mechanical heart valve, I think it is reasonable to continue anticoagulation".  CTA head/neck mild arthrosclerosis but no evidence of stenosis.  2D echo EF of 55% with abnormal septal motion due to postop effect, LA severely dilated, RA mild dilation, small pericardial effusion and mild calcification/thickening of aortic valve.  Previously on aspirin and Coumadin and recommended continuation with INR goal 3-3.5.  HTN stable.  LDL 65 and recommended continuation of atorvastatin 20 mg daily.  Controlled DM with A1c 6.1.  Other stroke risk factors include advanced age, prior history of stroke, CAD and valve replacement.  She was discharged to [facility] for ongoing therapy needs.       ROS:   14 system review of systems performed and negative with exception of joint pain and left-sided weakness  PMH:  Past Medical History:  Diagnosis Date  . Abnormality of gait 09/07/2016  . Allergy   . Diabetes mellitus without complication [facility])    Patient denies this - notes history of glucose intolerance  . GERD (gastroesophageal reflux disease)   . Hemiparesis and alteration of  sensations as late effects of stroke ([place]) 09/07/2016  . History of pneumonia 1997  . Hypertension   . S/P MVR (mitral valve replacement)    Mechanical mitral valve replacement at age 69 (done in [place])  // echo 7/17: EF 55-60, normal wall motion, bileaflet mechanical mitral valve prosthesis functioning normally, mild LAE, mildly reduced RVSF, small pericardial effusion  . Stroke [facility]) 1997, 2013, 2015    PSH:  Past Surgical History:  Procedure Laterality Date  . ABDOMINAL HYSTERECTOMY    . BRAIN SURGERY    . BUNIONECTOMY  1993  . CARDIAC VALVE REPLACEMENT  1997  . TOE SURGERY  1996    Social History:  Social History   Socioeconomic History  . Marital status: Married    Spouse name: Not on file  . Number of children: 2  . 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 Highest education level: Not on file  Occupational History  . Occupation: Retired  Tobacco Use  . Smoking status: Never Smoker  . Smokeless tobacco: Never Used  Vaping Use  . Vaping Use: Never used  Substance and Sexual Activity  . Alcohol use: No    Alcohol/week: 0.0 standard drinks  . Drug use: No  . Sexual activity: Not on file    Comment: Married  Other Topics Concern  . Not  on file  Social History Narrative   Lives at home w/ her husband   Right-handed   Caffeine: none   Social Determinants of Radio broadcast assistant Strain: Not on file  Food Insecurity: No Food Insecurity  . Worried About Charity fundraiser in the Last Year: Never true  . Ran Out of Food in the Last Year: Never true  Transportation Needs: No Transportation Needs  . Lack of Transportation (Medical): No  . Lack of Transportation (Non-Medical): No  Physical Activity: Not on file  Stress: Not on file  Social Connections: Not on file  Intimate Partner Violence: Not on file    Family History:  Family History  Problem Relation Age of Onset  . Cancer Mother        Bone  . Heart disease Mother   . Hyperlipidemia Mother    . Hypertension Mother   . Stroke Father   . Hypertension Father   . Heart attack Neg Hx     Medications:   Current Outpatient Medications on File Prior to Visit  Medication Sig Dispense Refill  . acetaminophen (TYLENOL) 325 MG tablet Take 2 tablets (650 mg total) by mouth every 4 (four) hours as needed for mild pain (or temp > 37.5 C (99.5 F)).    Marland Kitchen amLODipine (NORVASC) 5 MG tablet Take 1 tablet (5 mg total) by mouth at bedtime. 30 tablet 0  . aspirin EC 81 MG tablet Take 81 mg by mouth daily.     Marland Kitchen atorvastatin (LIPITOR) 20 MG tablet Take 20 mg by mouth daily.    . calcium carbonate 1250 MG capsule Take 1,200 mg by mouth at bedtime.     . cholecalciferol (VITAMIN D) 1000 UNITS tablet Take 2,000 Units daily by mouth.     . denosumab (PROLIA) 60 MG/ML SOSY injection Inject 60 mg into the skin every 6 (six) months.    . docusate sodium (COLACE) 100 MG capsule Take 100 mg at bedtime by mouth.     . Menthol, Topical Analgesic, (BENGAY EX) Apply 1 application topically daily. All painful areas    . omeprazole (PRILOSEC) 20 MG capsule Take 20 mg by mouth daily.    Marland Kitchen Propylene Glycol-Glycerin (SOOTHE) 0.6-0.6 % SOLN Place 1 drop into both eyes 2 (two) times daily as needed (dry eyes).    . warfarin (COUMADIN) 5 MG tablet Take 1-1.5 tablets (5-7.5 mg total) by mouth daily. Take 7.5mg  (one and a half tablets) on Monday and Friday; Take 5mg  (1 tablet) on all other days. OR AS DIRECTED PER COUMADIN CLINIC 45 tablet 0   No current facility-administered medications on file prior to visit.    Allergies:   Allergies  Allergen Reactions  . Zoloft [Sertraline] Hives, Itching and Rash     Physical Exam  Vitals:   12/03/20 1028  BP: 119/77  Pulse: 82  Weight: 128 lb 6.4 oz (58.2 kg)  Height: 5\' 7"  (1.702 m)   Body mass index is 20.11 kg/m. No exam data present  General: well developed, well nourished, very pleasant elderly Caucasian female, seated, in no evident distress Head: head  normocephalic and atraumatic.   Neck: supple with no carotid or supraclavicular bruits Cardiovascular: regular rate and rhythm, no murmurs; mechanical valve click Musculoskeletal: no deformity Skin:  no rash/petichiae Vascular:  Normal pulses all extremities   Neurologic Exam Mental Status: Awake and fully alert.   Fluent speech and language.  Oriented to place and time. Recent and remote memory  intact. Attention span, concentration and fund of knowledge appropriate. Mood and affect appropriate.  Cranial Nerves: Fundoscopic exam reveals sharp disc margins. Pupils equal, briskly reactive to light. Extraocular movements with horizontal nystagmus with 2-3 beat left gaze and mild continuous right gaze -chronic per patient.  Visual fields full to confrontation. Hearing intact. Facial sensation intact. Face, tongue, palate moves normally and symmetrically.  Motor: Normal bulk and tone.  Chronic left hemiparesis 4/5 and left foot drop with use of AFO with increased tone LUE Sensory.: intact to touch , pinprick , position and vibratory sensation.  Coordination: Rapid alternating movements normal in all extremities except mildly decreased left hand. Finger-to-nose and heel-to-shin positive for apraxia on left side. Gait and Station: Arises from chair with mild difficulty. Stance is normal. Gait demonstrates  mild left-sided hemiplegic gait and use of cane Reflexes: 1+ and symmetric. Toes downgoing.       ASSESSMENT: EIMY MOSQUERA is a 75 y.o. year old female with history of increased left hemiparesis on 09/03/2019 with stroke work-up showing acute extension along the anterior margin of old infarct secondary to unclear etiology with possible transient hypoperfusion.  Recent admission for dizziness likely BPPV with incidental finding of left frontal stroke.  Vascular risk factors include multiple prior strokes, HTN, HLD, mechanical heart valve on AC with INR goal 3-3.5, advanced age and CAD.       PLAN:  1. Hx of strokes with exception:  a. Residual left hemiparesis -stable b. Continue aspirin 81 mg daily and warfarin daily  and atorvastatin for secondary stroke prevention c. INR goal 3-3.5 d. Discussed importance of close PCP follow-up for aggressive stroke risk factor management. 2. HTN: BP<130/90.  Stable.  Managed by PCP. 3. HLD: LDL goal<70.  Continue atorvastatin.  Managed by PCP. 4. Mechanical heart valve: Continue warfarin with INR goal 3-3.5 managed and monitored by cardiology.  Patient had multiple questions regarding diet and vitamin K intake which was answered to the best my ability but also advised her to discuss further with Coumadin clinic and cardiology for additional recommendations   Follow-up in 6 months or call earlier if needed   CC:  [facility] provider: Dr. Marda Stalker, [name], MD    I spent 30 minutes of face-to-face and non-face-to-face time with patient.  This included previsit chart review including review of recent hospitalization, lab review, study review, order entry, electronic health record documentation, patient education regarding importance of secondary stroke risk factor management, residual deficits, importance of managing stroke risk factors, diet associated with both warfarin and answered all other questions to patient satisfaction   Frann Rider, AGNP-BC  [facility] [address] [place], [place] [address]  Phone [phone] Fax [phone] Note: This document was prepared with digital dictation and possible smart phrase technology. Any transcriptional errors that result from this process are unintentional.

## 2020-12-03 ENCOUNTER — Ambulatory Visit (INDEPENDENT_AMBULATORY_CARE_PROVIDER_SITE_OTHER): Payer: Medicare Other | Admitting: Adult Health

## 2020-12-03 ENCOUNTER — Encounter: Payer: Self-pay | Admitting: Adult Health

## 2020-12-03 VITALS — BP 119/77 | HR 82 | Ht 67.0 in | Wt 128.4 lb

## 2020-12-03 DIAGNOSIS — I6930 Unspecified sequelae of cerebral infarction: Secondary | ICD-10-CM | POA: Diagnosis not present

## 2020-12-03 NOTE — Patient Instructions (Signed)
Continue aspirin 81 mg daily and warfarin daily  and atorvastatin for secondary stroke prevention  Continue to follow with cardiology and Coumadin clinic as scheduled  Continue to follow up with PCP regarding blood pressure and cholesterol management  Maintain strict control of hypertension with blood pressure goal below 130/90 and cholesterol with LDL cholesterol (bad cholesterol) goal below 70 mg/dL.      Followup in the future with me in 6 months or call earlier if needed     Thank you for coming to see [place] at [facility]. I hope we have been able to provide you high quality care today.  You may receive a patient satisfaction survey over the next few weeks. We would appreciate your feedback and comments so that we may continue to improve ourselves and the health of our patients.    Vitamin K Foods and Warfarin Warfarin is a blood thinner (anticoagulant). Anticoagulant medicines help prevent the formation of blood clots. Warfarin works by blocking the activity of vitamin K, which promotes normal blood clotting. When you take warfarin, problems can occur from suddenly increasing or decreasing the amount of vitamin K that you eat from one day to the next. These problems can occur due to varying levels of warfarin in your blood. Problems may include:  Blood clots.  Bleeding. What are tips for eating the right amount of vitamin K? To avoid problems when taking warfarin:  Eat a balanced diet that includes: ? Fresh fruits and vegetables. ? Whole grains. ? Low-fat dairy products. ? Lean proteins, such as fish, eggs, and lean cuts of meat.  Keep your intake of vitamin K consistent from day to day. To do this: ? Avoid eating large amounts of vitamin K one day and low amounts of vitamin K the next day. ? If you take a multivitamin that contains vitamin K, be sure to take it every day. ? Know which foods contain vitamin K. Read food labels. Use the lists below to  understand serving sizes and the amount of vitamin K in one serving.  Avoid major changes in your diet. If you are going to change your diet, talk with your health care provider before making changes.  Work with a Financial planner (dietitian) to develop a meal plan that works best for you.   What foods are high in vitamin K? Foods that are high in vitamin K contain more than 100 mcg (micrograms) per serving. These include:    Broccoli (cooked from fresh) -  cup (78 g) has 110 mcg.  Brussels sprouts (cooked from fresh) -  cup (78 g) has 109 mcg.  Greens, beet (cooked from fresh) -  cup (72 g) has 350 mcg.  Greens, collard (cooked from fresh) -  cup (66 g) has 263 mcg.  Greens, turnip (cooked from fresh) -  cup (72 g) has 265 mcg.  Green onions or scallions -  cup (50 g) has 105 mcg.  Kale (cooked from fresh) -  cup (68 g) has 536 mcg.  Parsley (raw) - 10 sprigs (10 g) has 164 mcg.  Spinach (cooked from fresh) -  cup (90 g) has 444 mcg.  Swiss chard (cooked from fresh) -  cup (88 g) has 287 mcg.   What foods have a moderate amount of vitamin K? Foods that have a moderate amount of vitamin K contain 25-100 mcg per serving. These include:  Asparagus (cooked from fresh) - 4 spears (60 g) have 30 mcg.  Black-eyed peas (dried) -  cup (85 g) has 32 mcg.  Cabbage (cooked from fresh) -  cup (78 g) has 84 mcg.  Cabbage (raw) -  cup (35 g) has 26 mcg.  Kiwi fruit - 1 medium (69 g) has 27 mcg.  Lettuce (raw) - 1 cup (36 g) has 45 mcg.  Okra (cooked from fresh) -  cup (80 g) has 32 mcg.  Prunes (dried) - 5 prunes (47 g) have 25 mcg.  Watercress (raw) - 1 cup (34 g) has 85 mcg. What foods are low in vitamin K? Foods low in vitamin K contain less than 25 mcg per serving. These include:  Artichoke - 1 medium (128 g) has 18 mcg.  Avocado - 1 oz (21 g) has 6 mcg.  Blueberries -  cup (73 g) has 14 mcg.  Carrots (cooked from fresh) -  cup (78 g) has 11  mcg.  Cauliflower (raw) -  cup (54 g) has 8 mcg.  Cucumber with peel (raw) -  cup (52 g) has 9 mcg.  Grapes -  cup (76 g) has 12 mcg.  Mango - 1 medium (207 g) has 9 mcg.  Mixed nuts - 1 cup (142 g) has 17 mcg.  Pear - 1 medium (178 g) has 8 mcg.  Peas (cooked from fresh) -  cup (80 g) has 20 mcg.  Pickled cucumber - 1 spear (65 g) has 11 mcg.  Sauerkraut (canned) -  cup (118 g) has 16 mcg.  Soybeans (cooked from fresh) -  cup (86 g) has 16 mcg.  Tomato (raw) - 1 medium (123 g) has 10 mcg.  Tomato sauce (raw) -  cup (123 g) has 17 mcg.   What foods do not have vitamin K? If a food contains less than 5 mcg per serving, it is considered to have no vitamin K. These foods include:  Bread and cereal products.  Cheese.  Eggs.  Fish and shellfish.  Meat and poultry.  Milk and dairy products.  Seeds, such as sunflower or pumpkin seeds. The items listed above may not be a complete list of foods that have high, moderate, and low amounts of vitamin K or do not have vitamin K. Actual amounts of vitamin K may differ depending on processing. Contact a dietitian for more information. Summary  Warfarin is an anticoagulant that prevents blood clots by blocking the activity of vitamin K. It is important to monitor the content of vitamin K in your foods and to be consistent with the amount of vitamin K you take in each day.  Avoid major changes in your diet. If you are going to change your diet, talk with your health care provider before making changes. This information is not intended to replace advice given to you by your health care provider. Make sure you discuss any questions you have with your health care provider. Document Revised: 08/14/2019 Document Reviewed: 08/14/2019 Elsevier Patient Education  [place].

## 2020-12-08 ENCOUNTER — Ambulatory Visit (INDEPENDENT_AMBULATORY_CARE_PROVIDER_SITE_OTHER): Payer: Medicare Other | Admitting: Interventional Cardiology

## 2020-12-08 DIAGNOSIS — Z5181 Encounter for therapeutic drug level monitoring: Secondary | ICD-10-CM | POA: Diagnosis not present

## 2020-12-08 LAB — POCT INR: INR: 4.1 — AB (ref 2.0–3.0)

## 2020-12-08 NOTE — Progress Notes (Signed)
I agree with the above plan 

## 2020-12-08 NOTE — Patient Instructions (Signed)
Description   Called and spoke to pt, instructed her to take 1/2 a tablet of warfarin today and then continue taking 1 tablet (5mg ) daily except for 1.5 tablets (7.5mg ) on Mondays and Fridays. Instead of romaine you can try a serving (1/2 a cup) of broccoli. Recheck INR in 1 week. ( Self tester).

## 2020-12-12 ENCOUNTER — Encounter: Payer: Medicare Other | Attending: Registered Nurse | Admitting: Physical Medicine & Rehabilitation

## 2020-12-12 ENCOUNTER — Encounter: Payer: Self-pay | Admitting: Physical Medicine & Rehabilitation

## 2020-12-12 ENCOUNTER — Other Ambulatory Visit: Payer: Self-pay

## 2020-12-12 VITALS — BP 125/75 | HR 78 | Temp 98.4°F | Ht 67.0 in | Wt 128.4 lb

## 2020-12-12 DIAGNOSIS — G8194 Hemiplegia, unspecified affecting left nondominant side: Secondary | ICD-10-CM | POA: Insufficient documentation

## 2020-12-12 NOTE — Patient Instructions (Signed)
Keep up with home ex program as you are

## 2020-12-12 NOTE — Progress Notes (Signed)
Subjective:    Patient ID: [name], female    DOB: 06/14/1946, 75 y.o.   MRN: [id number] 75 y.o. 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  Per chart review lives with spouse.  Used a cane prior to admission and a left AFO.  She has a son in [place].  Presented 07/01/2020 with dizziness and weakness x3 days.  MRI showed single small cortical subcortical ischemic nonhemorrhagic infarct involving the parasagittal posterior left frontal lobe.  Underlying moderate to advanced age-related cerebral atrophy with chronic microvascular ischemic disease with multiple remote lacunar infarcts involving the left thalamus and bilateral cerebellar hemispheres.  CT angiogram of head and neck with no emergent findings.  Patient did not receive TPA.  Admission chemistries INR 2.9 hemoglobin A1c 6.1 chemistries unremarkable.  Echocardiogram with ejection fraction of 60% no wall motion abnormalities.  Chronic Coumadin ongoing with a goal INR of 3.0-3.5.  Tolerating regular diet.  Patient was admitted for a comprehensive rehab program.     Admit date: 07/04/2020 Discharge date: 07/16/2020 HPI  patient completed outpatient therapy in November 2021.  She has kept up with her home exercise program almost an hour a day every day.  She remains independent with all self-care and mobility. Goes to [employer] park to walk twice a week.    Cannot walk without cane outside the house, unable to carry groceries the patient is not driving she continues to follow-up with neurology Pain Inventory Average Pain 5 Pain Right Now 1 My pain is intermittent and aching  In the last 24 hours, has pain interfered with the following? General activity 1 Relation with others 0 Enjoyment of life 0 What TIME of day is your pain at its  worst? morning  Sleep (in general) Good  Pain is worse with: bending and standing Pain improves with: heat/ice, therapy/exercise, pacing activities and medication Relief from Meds: 8  Family History  Problem Relation Age of Onset  . Cancer Mother        Bone  . Heart disease Mother   . Hyperlipidemia Mother   . Hypertension Mother   . Stroke Father   . Hypertension Father   . Heart attack Neg Hx    Social History   Socioeconomic History  . Marital status: Married    Spouse name: Not on file  . Number of children: 2  . 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 Highest education level: Not on file  Occupational History  . Occupation: Retired  Tobacco Use  . Smoking status: Never Smoker  . Smokeless tobacco: Never Used  Vaping Use  . Vaping Use: Never used  Substance and Sexual Activity  . Alcohol use: No    Alcohol/week: 0.0 standard drinks  . Drug use: No  . Sexual activity: Not on file    Comment: Married  Other Topics Concern  . Not on file  Social History Narrative   Lives at home w/ her husband   Right-handed   Caffeine: none   Social Determinants of Health   Financial Resource Strain: Not on file  Food Insecurity: No Food Insecurity  . Worried About Charity fundraiser in the Last Year: Never true  . Ran Out of Food in the Last Year: Never true  Transportation Needs: No Transportation Needs  . Lack of Transportation (Medical): No  . Lack  of Transportation (Non-Medical): No  Physical Activity: Not on file  Stress: Not on file  Social Connections: Not on file   Past Surgical History:  Procedure Laterality Date  . ABDOMINAL HYSTERECTOMY    . BRAIN SURGERY    . BUNIONECTOMY  1993  . CARDIAC VALVE REPLACEMENT  1997  . TOE SURGERY  1996   Past Surgical History:  Procedure Laterality Date  . ABDOMINAL HYSTERECTOMY    . BRAIN SURGERY    . BUNIONECTOMY  1993  . CARDIAC VALVE REPLACEMENT  1997  . TOE SURGERY  1996   Past Medical History:   Diagnosis Date  . Abnormality of gait 09/07/2016  . Allergy   . Diabetes mellitus without complication [facility])    Patient denies this - notes history of glucose intolerance  . GERD (gastroesophageal reflux disease)   . Hemiparesis and alteration of sensations as late effects of stroke ([place]) 09/07/2016  . History of pneumonia 1997  . Hypertension   . S/P MVR (mitral valve replacement)    Mechanical mitral valve replacement at age 14 (done in [place])  // echo 7/17: EF 55-60, normal wall motion, bileaflet mechanical mitral valve prosthesis functioning normally, mild LAE, mildly reduced RVSF, small pericardial effusion  . Stroke ([place]) 1997, 2013, 2015   BP 125/75   Pulse 78   Temp 98.4 F (36.9 C)   Ht 5\' 7"  (1.702 m)   Wt 128 lb 6.4 oz (58.2 kg)   SpO2 95%   BMI 20.11 kg/m   Opioid Risk Score:   Fall Risk Score:  `1  Depression screen PHQ 2/9  Depression screen [facility] 2/9 09/12/2020 07/29/2020 07/18/2020 10/22/2019 09/25/2019  Decreased Interest 0 0 0 0 0  Down, Depressed, Hopeless 0 0 0 0 0  PHQ - 2 Score 0 0 0 0 0  Altered sleeping - 1 - - -  Tired, decreased energy - 0 - - -  Change in appetite - 0 - - -  Feeling bad or failure about yourself  - 0 - - -  Trouble concentrating - 0 - - -  Moving slowly or fidgety/restless - 0 - - -  Suicidal thoughts - 0 - - -  PHQ-9 Score - 1 - - -  Some recent data might be hidden     Review of Systems  All other systems reviewed and are negative.      Objective:   Physical Exam Vitals and nursing note reviewed.  Constitutional:      Appearance: She is obese.  HENT:     Head: Normocephalic and atraumatic.  Eyes:     Extraocular Movements: Extraocular movements intact.     Conjunctiva/sclera: Conjunctivae normal.     Pupils: Pupils are equal, round, and reactive to light.  Skin:    General: Skin is warm and dry.  Neurological:     General: No focal deficit present.     Mental Status: She is alert and oriented to person,  place, and time.     Comments: Motor strength is 5 - in the right deltoid bicep tricep grip 5 bilateral hip flexor knee extensor 5 right ankle dorsiflexor, 3 - left ankle dorsiflexor, left upper extremity 4 - at the deltoid bicep tricep finger flexors and extensors fine motor is reduced on left side with finger to thumb opposition slower than the right side. Sensation is mildly diminished light touch in the left upper extremity compared to the right extremity. Tone appears normal bilateral upper and lower  limbs ambulates with a cane widened base support short step length  Psychiatric:        Mood and Affect: Mood normal.        Behavior: Behavior normal.           Assessment & Plan:  1.  Residual left-sided weakness from stroke in October 2020.  The left CVA symptoms in 2021 have largely resolved patient is at a modified independent level has some limitations in terms of community ambulation as well as carrying objects due to the use of cane. She will follow-up with neurology physical medicine rehab 6 months no formalized therapy note needed at this time. Continue home exercise program as discussed with patient and her husband.  Also continue walking with husband for exercise as she is doing twice a week

## 2020-12-15 ENCOUNTER — Ambulatory Visit (INDEPENDENT_AMBULATORY_CARE_PROVIDER_SITE_OTHER): Payer: Medicare Other | Admitting: Pharmacist

## 2020-12-15 DIAGNOSIS — Z5181 Encounter for therapeutic drug level monitoring: Secondary | ICD-10-CM | POA: Diagnosis not present

## 2020-12-15 DIAGNOSIS — Z952 Presence of prosthetic heart valve: Secondary | ICD-10-CM

## 2020-12-15 DIAGNOSIS — I69359 Hemiplegia and hemiparesis following cerebral infarction affecting unspecified side: Secondary | ICD-10-CM

## 2020-12-15 DIAGNOSIS — Z7901 Long term (current) use of anticoagulants: Secondary | ICD-10-CM | POA: Diagnosis not present

## 2020-12-15 LAB — POCT INR: INR: 3.4 — AB (ref 2.0–3.0)

## 2020-12-15 NOTE — Patient Instructions (Signed)
Description   Spoke with patient. Will continue taking 1 tablet (5mg ) daily except for 1.5 tablets (7.5mg ) on Mondays and Fridays. Instead of romaine you can try a serving (1/2 a cup) of broccoli. Recheck INR in 1 week. ( Self tester).

## 2020-12-22 ENCOUNTER — Ambulatory Visit (INDEPENDENT_AMBULATORY_CARE_PROVIDER_SITE_OTHER): Payer: Medicare Other

## 2020-12-22 DIAGNOSIS — Z5181 Encounter for therapeutic drug level monitoring: Secondary | ICD-10-CM | POA: Diagnosis not present

## 2020-12-22 DIAGNOSIS — Z952 Presence of prosthetic heart valve: Secondary | ICD-10-CM | POA: Diagnosis not present

## 2020-12-22 DIAGNOSIS — Z7901 Long term (current) use of anticoagulants: Secondary | ICD-10-CM

## 2020-12-22 DIAGNOSIS — I69359 Hemiplegia and hemiparesis following cerebral infarction affecting unspecified side: Secondary | ICD-10-CM | POA: Diagnosis not present

## 2020-12-22 LAB — POCT INR: INR: 3.5 — AB (ref 2.0–3.0)

## 2020-12-22 NOTE — Patient Instructions (Signed)
Spoke with patient.  Advised her to have a serving of greens today and continue taking 1 tablet (5mg ) daily except for 1.5 tablets (7.5mg ) on Mondays and Fridays. Instead of romaine you can try a serving (1/2 a cup) of broccoli. Recheck INR in 1 week. ( Self tester).

## 2020-12-23 DIAGNOSIS — B078 Other viral warts: Secondary | ICD-10-CM | POA: Diagnosis not present

## 2020-12-23 DIAGNOSIS — L821 Other seborrheic keratosis: Secondary | ICD-10-CM | POA: Diagnosis not present

## 2020-12-23 DIAGNOSIS — D2239 Melanocytic nevi of other parts of face: Secondary | ICD-10-CM | POA: Diagnosis not present

## 2020-12-23 DIAGNOSIS — Z1283 Encounter for screening for malignant neoplasm of skin: Secondary | ICD-10-CM | POA: Diagnosis not present

## 2020-12-29 ENCOUNTER — Ambulatory Visit (INDEPENDENT_AMBULATORY_CARE_PROVIDER_SITE_OTHER): Payer: Medicare Other

## 2020-12-29 DIAGNOSIS — I69359 Hemiplegia and hemiparesis following cerebral infarction affecting unspecified side: Secondary | ICD-10-CM | POA: Diagnosis not present

## 2020-12-29 DIAGNOSIS — Z7901 Long term (current) use of anticoagulants: Secondary | ICD-10-CM | POA: Diagnosis not present

## 2020-12-29 DIAGNOSIS — Z952 Presence of prosthetic heart valve: Secondary | ICD-10-CM

## 2020-12-29 DIAGNOSIS — Z5181 Encounter for therapeutic drug level monitoring: Secondary | ICD-10-CM

## 2020-12-29 LAB — POCT INR: INR: 3.1 — AB (ref 2.0–3.0)

## 2020-12-29 NOTE — Patient Instructions (Signed)
Description   Spoke with patient, advised her to continue on same dosage 1 tablet (5mg ) daily except for 1.5 tablets (7.5mg ) on Mondays and Fridays. Keep green leafy vegetable intake consistent. Recheck INR in 1 week. ( Self tester).

## 2021-01-05 ENCOUNTER — Ambulatory Visit (INDEPENDENT_AMBULATORY_CARE_PROVIDER_SITE_OTHER): Payer: Medicare Other | Admitting: Cardiovascular Disease

## 2021-01-05 DIAGNOSIS — Z5181 Encounter for therapeutic drug level monitoring: Secondary | ICD-10-CM | POA: Diagnosis not present

## 2021-01-05 LAB — POCT INR: INR: 3.4 — AB (ref 2.0–3.0)

## 2021-01-05 NOTE — Patient Instructions (Signed)
Description   Spoke with patient, advised her to continue on same dosage 1 tablet (5mg ) daily except for 1.5 tablets (7.5mg ) on Mondays and Fridays. Keep green leafy vegetable intake consistent. Recheck INR in 1 week. ( Self tester).

## 2021-01-12 ENCOUNTER — Ambulatory Visit (INDEPENDENT_AMBULATORY_CARE_PROVIDER_SITE_OTHER): Payer: Medicare Other | Admitting: Internal Medicine

## 2021-01-12 DIAGNOSIS — Z5181 Encounter for therapeutic drug level monitoring: Secondary | ICD-10-CM

## 2021-01-12 LAB — POCT INR: INR: 3.9 — AB (ref 2.0–3.0)

## 2021-01-12 NOTE — Patient Instructions (Signed)
Description   Spoke with patient, advised her to take 1/2 a tablet of warfarin today and then continue on same dosage 1 tablet (5mg ) daily except for 1.5 tablets (7.5mg ) on Mondays and Fridays. Keep green leafy vegetable intake consistent. Recheck INR in 1 week. ( Self tester).

## 2021-01-19 ENCOUNTER — Ambulatory Visit (INDEPENDENT_AMBULATORY_CARE_PROVIDER_SITE_OTHER): Payer: Medicare Other

## 2021-01-19 DIAGNOSIS — Z952 Presence of prosthetic heart valve: Secondary | ICD-10-CM

## 2021-01-19 DIAGNOSIS — Z7901 Long term (current) use of anticoagulants: Secondary | ICD-10-CM

## 2021-01-19 DIAGNOSIS — I69359 Hemiplegia and hemiparesis following cerebral infarction affecting unspecified side: Secondary | ICD-10-CM

## 2021-01-19 DIAGNOSIS — Z5181 Encounter for therapeutic drug level monitoring: Secondary | ICD-10-CM

## 2021-01-19 LAB — POCT INR: INR: 2.7 (ref 2.0–3.0)

## 2021-01-19 NOTE — Patient Instructions (Signed)
Spoke with patient, advised her to take 1 tablet of warfarin today and then continue on same dosage 1 tablet (5mg ) daily except for 1.5 tablets (7.5mg ) on Mondays and Fridays. Keep green leafy vegetable intake consistent. Recheck INR in 1 week. ( Self tester).

## 2021-01-26 ENCOUNTER — Ambulatory Visit (INDEPENDENT_AMBULATORY_CARE_PROVIDER_SITE_OTHER): Payer: Medicare Other

## 2021-01-26 DIAGNOSIS — I69359 Hemiplegia and hemiparesis following cerebral infarction affecting unspecified side: Secondary | ICD-10-CM | POA: Diagnosis not present

## 2021-01-26 DIAGNOSIS — Z5181 Encounter for therapeutic drug level monitoring: Secondary | ICD-10-CM

## 2021-01-26 DIAGNOSIS — Z952 Presence of prosthetic heart valve: Secondary | ICD-10-CM

## 2021-01-26 DIAGNOSIS — Z7901 Long term (current) use of anticoagulants: Secondary | ICD-10-CM

## 2021-01-26 LAB — POCT INR: INR: 4 — AB (ref 2.0–3.0)

## 2021-01-26 NOTE — Patient Instructions (Signed)
Spoke with patient, advised her to take 1/2 tablet of warfarin today and then continue on same dosage 1 tablet (5mg ) daily except for 1.5 tablets (7.5mg ) on Mondays and Fridays. Keep green leafy vegetable intake consistent. Recheck INR in 1 week. ( Self tester).

## 2021-02-02 ENCOUNTER — Ambulatory Visit (INDEPENDENT_AMBULATORY_CARE_PROVIDER_SITE_OTHER): Payer: Medicare Other | Admitting: Cardiology

## 2021-02-02 DIAGNOSIS — Z5181 Encounter for therapeutic drug level monitoring: Secondary | ICD-10-CM

## 2021-02-02 LAB — POCT INR: INR: 2.5 (ref 2.0–3.0)

## 2021-02-02 NOTE — Patient Instructions (Signed)
Description   Spoke with patient, advised her to take 2 tablets of warfarin today and then continue on same dosage 1 tablet (5mg ) daily except for 1.5 tablets (7.5mg ) on Mondays and Fridays. Keep green leafy vegetable intake consistent. Recheck INR in 1 week. ( Self tester).

## 2021-02-05 DIAGNOSIS — R7301 Impaired fasting glucose: Secondary | ICD-10-CM | POA: Diagnosis not present

## 2021-02-05 DIAGNOSIS — I129 Hypertensive chronic kidney disease with stage 1 through stage 4 chronic kidney disease, or unspecified chronic kidney disease: Secondary | ICD-10-CM | POA: Diagnosis not present

## 2021-02-05 DIAGNOSIS — N182 Chronic kidney disease, stage 2 (mild): Secondary | ICD-10-CM | POA: Diagnosis not present

## 2021-02-05 DIAGNOSIS — K219 Gastro-esophageal reflux disease without esophagitis: Secondary | ICD-10-CM | POA: Diagnosis not present

## 2021-02-05 DIAGNOSIS — M810 Age-related osteoporosis without current pathological fracture: Secondary | ICD-10-CM | POA: Diagnosis not present

## 2021-02-05 DIAGNOSIS — E7800 Pure hypercholesterolemia, unspecified: Secondary | ICD-10-CM | POA: Diagnosis not present

## 2021-02-09 ENCOUNTER — Ambulatory Visit (INDEPENDENT_AMBULATORY_CARE_PROVIDER_SITE_OTHER): Payer: Medicare Other | Admitting: *Deleted

## 2021-02-09 DIAGNOSIS — I69359 Hemiplegia and hemiparesis following cerebral infarction affecting unspecified side: Secondary | ICD-10-CM | POA: Diagnosis not present

## 2021-02-09 DIAGNOSIS — Z7901 Long term (current) use of anticoagulants: Secondary | ICD-10-CM | POA: Diagnosis not present

## 2021-02-09 DIAGNOSIS — Z952 Presence of prosthetic heart valve: Secondary | ICD-10-CM | POA: Diagnosis not present

## 2021-02-09 DIAGNOSIS — Z5181 Encounter for therapeutic drug level monitoring: Secondary | ICD-10-CM | POA: Diagnosis not present

## 2021-02-09 LAB — POCT INR: INR: 3.8 — AB (ref 2.0–3.0)

## 2021-02-09 NOTE — Patient Instructions (Signed)
Description   Spoke with patient, advised her to take 1 tablet of warfarin today and then continue taking 1 tablet (5mg ) daily except for 1.5 tablets (7.5mg ) on Mondays and Fridays. Keep green leafy vegetable intake consistent. Recheck INR in 1 week. ( Self tester).

## 2021-02-11 DIAGNOSIS — R3121 Asymptomatic microscopic hematuria: Secondary | ICD-10-CM | POA: Diagnosis not present

## 2021-02-11 DIAGNOSIS — N200 Calculus of kidney: Secondary | ICD-10-CM | POA: Diagnosis not present

## 2021-02-11 DIAGNOSIS — N281 Cyst of kidney, acquired: Secondary | ICD-10-CM | POA: Diagnosis not present

## 2021-02-16 ENCOUNTER — Ambulatory Visit (INDEPENDENT_AMBULATORY_CARE_PROVIDER_SITE_OTHER): Payer: Medicare Other | Admitting: Internal Medicine

## 2021-02-16 ENCOUNTER — Telehealth: Payer: Self-pay | Admitting: Cardiovascular Disease

## 2021-02-16 DIAGNOSIS — Z5181 Encounter for therapeutic drug level monitoring: Secondary | ICD-10-CM | POA: Diagnosis not present

## 2021-02-16 DIAGNOSIS — I69359 Hemiplegia and hemiparesis following cerebral infarction affecting unspecified side: Secondary | ICD-10-CM

## 2021-02-16 DIAGNOSIS — Z952 Presence of prosthetic heart valve: Secondary | ICD-10-CM

## 2021-02-16 DIAGNOSIS — Z7901 Long term (current) use of anticoagulants: Secondary | ICD-10-CM

## 2021-02-16 LAB — POCT INR: INR: 3.8 — AB (ref 2.0–3.0)

## 2021-02-16 NOTE — Patient Instructions (Signed)
Description   Spoke with patient, advised her to take 1/2 tablet  (2.5mg ) of warfarin today and then continue taking 1 tablet (5mg ) daily except for 1.5 tablets (7.5mg ) on Mondays and Fridays. Keep green leafy vegetable intake consistent. Recheck INR in 1 week. ( Self tester).

## 2021-02-16 NOTE — Telephone Encounter (Signed)
Agent called to report out of range INR for the patient.  Agent hung up before the call was transferred to a Triage nurse

## 2021-02-23 ENCOUNTER — Ambulatory Visit (INDEPENDENT_AMBULATORY_CARE_PROVIDER_SITE_OTHER): Payer: Medicare Other | Admitting: *Deleted

## 2021-02-23 DIAGNOSIS — Z7901 Long term (current) use of anticoagulants: Secondary | ICD-10-CM | POA: Diagnosis not present

## 2021-02-23 DIAGNOSIS — Z5181 Encounter for therapeutic drug level monitoring: Secondary | ICD-10-CM | POA: Diagnosis not present

## 2021-02-23 DIAGNOSIS — Z952 Presence of prosthetic heart valve: Secondary | ICD-10-CM | POA: Diagnosis not present

## 2021-02-23 DIAGNOSIS — I69359 Hemiplegia and hemiparesis following cerebral infarction affecting unspecified side: Secondary | ICD-10-CM | POA: Diagnosis not present

## 2021-02-23 LAB — POCT INR: INR: 2.8 (ref 2.0–3.0)

## 2021-02-23 NOTE — Patient Instructions (Signed)
Description   Spoke with patient, advised her to take 2 tablets (10mg ) of Warfarin today then continue taking 1 tablet (5mg ) daily except for 1.5 tablets (7.5mg ) on Mondays and Fridays. Keep green leafy vegetable intake consistent. Recheck INR in 1 week. (Self tester).

## 2021-03-02 ENCOUNTER — Ambulatory Visit (INDEPENDENT_AMBULATORY_CARE_PROVIDER_SITE_OTHER): Payer: Medicare Other

## 2021-03-02 DIAGNOSIS — I69359 Hemiplegia and hemiparesis following cerebral infarction affecting unspecified side: Secondary | ICD-10-CM

## 2021-03-02 DIAGNOSIS — I639 Cerebral infarction, unspecified: Secondary | ICD-10-CM

## 2021-03-02 DIAGNOSIS — Z7901 Long term (current) use of anticoagulants: Secondary | ICD-10-CM | POA: Diagnosis not present

## 2021-03-02 DIAGNOSIS — Z952 Presence of prosthetic heart valve: Secondary | ICD-10-CM

## 2021-03-02 DIAGNOSIS — Z5181 Encounter for therapeutic drug level monitoring: Secondary | ICD-10-CM

## 2021-03-02 DIAGNOSIS — R269 Unspecified abnormalities of gait and mobility: Secondary | ICD-10-CM

## 2021-03-02 LAB — POCT INR: INR: 2.8 (ref 2.0–3.0)

## 2021-03-02 NOTE — Patient Instructions (Signed)
Spoke with patient, advised her to take 2 tablets (10mg ) of Warfarin today then continue taking 1 tablet (5mg ) daily except for 1.5 tablets (7.5mg ) on Mondays and Fridays. Keep green leafy vegetable intake consistent. Recheck INR in 1 week. (Self tester).

## 2021-03-07 DIAGNOSIS — I129 Hypertensive chronic kidney disease with stage 1 through stage 4 chronic kidney disease, or unspecified chronic kidney disease: Secondary | ICD-10-CM | POA: Diagnosis not present

## 2021-03-07 DIAGNOSIS — E7800 Pure hypercholesterolemia, unspecified: Secondary | ICD-10-CM | POA: Diagnosis not present

## 2021-03-07 DIAGNOSIS — N182 Chronic kidney disease, stage 2 (mild): Secondary | ICD-10-CM | POA: Diagnosis not present

## 2021-03-09 ENCOUNTER — Ambulatory Visit (INDEPENDENT_AMBULATORY_CARE_PROVIDER_SITE_OTHER): Payer: Medicare Other | Admitting: Internal Medicine

## 2021-03-09 DIAGNOSIS — N281 Cyst of kidney, acquired: Secondary | ICD-10-CM | POA: Diagnosis not present

## 2021-03-09 DIAGNOSIS — Z5181 Encounter for therapeutic drug level monitoring: Secondary | ICD-10-CM

## 2021-03-09 DIAGNOSIS — R3121 Asymptomatic microscopic hematuria: Secondary | ICD-10-CM | POA: Diagnosis not present

## 2021-03-09 DIAGNOSIS — N200 Calculus of kidney: Secondary | ICD-10-CM | POA: Diagnosis not present

## 2021-03-09 LAB — POCT INR: INR: 2.6 (ref 2.0–3.0)

## 2021-03-09 NOTE — Patient Instructions (Signed)
Description   Spoke to pt and instructed her to take 2 tablets of warfarin today and then to START taking 1 tablet daily except for 1.5 tablets on Monday, Wednesday and Fridays. Recheck INR in 1 week. Coumadin Clinic [phone].

## 2021-03-13 ENCOUNTER — Other Ambulatory Visit: Payer: Self-pay | Admitting: Cardiovascular Disease

## 2021-03-16 ENCOUNTER — Ambulatory Visit (INDEPENDENT_AMBULATORY_CARE_PROVIDER_SITE_OTHER): Payer: Medicare Other | Admitting: *Deleted

## 2021-03-16 DIAGNOSIS — Z952 Presence of prosthetic heart valve: Secondary | ICD-10-CM | POA: Diagnosis not present

## 2021-03-16 DIAGNOSIS — I69359 Hemiplegia and hemiparesis following cerebral infarction affecting unspecified side: Secondary | ICD-10-CM

## 2021-03-16 DIAGNOSIS — Z7901 Long term (current) use of anticoagulants: Secondary | ICD-10-CM

## 2021-03-16 DIAGNOSIS — Z5181 Encounter for therapeutic drug level monitoring: Secondary | ICD-10-CM | POA: Diagnosis not present

## 2021-03-16 LAB — POCT INR: INR: 3.1 — AB (ref 2.0–3.0)

## 2021-03-16 NOTE — Patient Instructions (Signed)
Description   Spoke to pt and instructed her to continue taking 1 tablet daily except for 1.5 tablets on Mondays, Wednesdays and Fridays. Recheck INR in 1 week. Coumadin Clinic [phone].

## 2021-03-23 ENCOUNTER — Ambulatory Visit (INDEPENDENT_AMBULATORY_CARE_PROVIDER_SITE_OTHER): Payer: Medicare Other | Admitting: Pharmacist

## 2021-03-23 DIAGNOSIS — Z5181 Encounter for therapeutic drug level monitoring: Secondary | ICD-10-CM

## 2021-03-23 DIAGNOSIS — Z952 Presence of prosthetic heart valve: Secondary | ICD-10-CM | POA: Diagnosis not present

## 2021-03-23 DIAGNOSIS — Z7901 Long term (current) use of anticoagulants: Secondary | ICD-10-CM

## 2021-03-23 DIAGNOSIS — I69359 Hemiplegia and hemiparesis following cerebral infarction affecting unspecified side: Secondary | ICD-10-CM

## 2021-03-23 LAB — POCT INR: INR: 3.3 — AB (ref 2.0–3.0)

## 2021-03-25 ENCOUNTER — Other Ambulatory Visit (HOSPITAL_COMMUNITY): Payer: Self-pay | Admitting: *Deleted

## 2021-03-26 ENCOUNTER — Other Ambulatory Visit: Payer: Self-pay

## 2021-03-26 ENCOUNTER — Encounter (HOSPITAL_COMMUNITY): Payer: Medicare Other

## 2021-03-26 ENCOUNTER — Ambulatory Visit (HOSPITAL_COMMUNITY)
Admission: RE | Admit: 2021-03-26 | Discharge: 2021-03-26 | Disposition: A | Discharge status: Home / Self Care | Payer: Medicare Other | Source: Ambulatory Visit | Attending: Internal Medicine | Admitting: Internal Medicine

## 2021-03-26 DIAGNOSIS — M810 Age-related osteoporosis without current pathological fracture: Secondary | ICD-10-CM | POA: Insufficient documentation

## 2021-03-26 MED ORDER — DENOSUMAB 60 MG/ML ~~LOC~~ SOSY
60.0000 mg | PREFILLED_SYRINGE | Freq: Once | SUBCUTANEOUS | Status: AC
  Administered 2021-03-26: 60 mg via SUBCUTANEOUS

## 2021-03-26 MED ORDER — DENOSUMAB 60 MG/ML ~~LOC~~ SOSY
PREFILLED_SYRINGE | SUBCUTANEOUS | Status: AC
  Filled 2021-03-26: qty 1

## 2021-03-30 ENCOUNTER — Ambulatory Visit (INDEPENDENT_AMBULATORY_CARE_PROVIDER_SITE_OTHER): Payer: Medicare Other | Admitting: Pharmacist

## 2021-03-30 DIAGNOSIS — Z7901 Long term (current) use of anticoagulants: Secondary | ICD-10-CM | POA: Diagnosis not present

## 2021-03-30 DIAGNOSIS — Z5181 Encounter for therapeutic drug level monitoring: Secondary | ICD-10-CM | POA: Diagnosis not present

## 2021-03-30 DIAGNOSIS — I69359 Hemiplegia and hemiparesis following cerebral infarction affecting unspecified side: Secondary | ICD-10-CM

## 2021-03-30 DIAGNOSIS — Z952 Presence of prosthetic heart valve: Secondary | ICD-10-CM

## 2021-03-30 LAB — POCT INR: INR: 5.1 — AB (ref 2.0–3.0)

## 2021-03-30 NOTE — Patient Instructions (Signed)
Description   Spoke with patient.  Is out of town and not eating normally.  Instructed her to hold warfarin tonight and take 1/2 tablet tomorrow (2.5mg ).  Will recheck INR on Friday 5/27 since closed on [facility] Day. Recheck INR in 4 days. Coumadin Clinic [phone].

## 2021-04-03 ENCOUNTER — Ambulatory Visit (INDEPENDENT_AMBULATORY_CARE_PROVIDER_SITE_OTHER): Payer: Medicare Other | Admitting: *Deleted

## 2021-04-03 DIAGNOSIS — Z952 Presence of prosthetic heart valve: Secondary | ICD-10-CM

## 2021-04-03 DIAGNOSIS — Z5181 Encounter for therapeutic drug level monitoring: Secondary | ICD-10-CM | POA: Diagnosis not present

## 2021-04-03 DIAGNOSIS — Z7901 Long term (current) use of anticoagulants: Secondary | ICD-10-CM

## 2021-04-03 DIAGNOSIS — I69359 Hemiplegia and hemiparesis following cerebral infarction affecting unspecified side: Secondary | ICD-10-CM

## 2021-04-03 LAB — POCT INR: INR: 1.9 — AB (ref 2.0–3.0)

## 2021-04-03 NOTE — Patient Instructions (Signed)
Description   Spoke with patient and instructed pt to take 10mg  (2 tablets) today and 7.5mg  (1.5 tablets) tomorrow then continue taking 5mg  daily except 1.5 tablets on Monday, Wednesday, and Friday. Recheck INR in 1 week. Coumadin Clinic [phone].

## 2021-04-08 DIAGNOSIS — M810 Age-related osteoporosis without current pathological fracture: Secondary | ICD-10-CM | POA: Diagnosis not present

## 2021-04-08 DIAGNOSIS — Z0000 Encounter for general adult medical examination without abnormal findings: Secondary | ICD-10-CM | POA: Diagnosis not present

## 2021-04-08 DIAGNOSIS — R7301 Impaired fasting glucose: Secondary | ICD-10-CM | POA: Diagnosis not present

## 2021-04-08 DIAGNOSIS — E7800 Pure hypercholesterolemia, unspecified: Secondary | ICD-10-CM | POA: Diagnosis not present

## 2021-04-10 ENCOUNTER — Ambulatory Visit (INDEPENDENT_AMBULATORY_CARE_PROVIDER_SITE_OTHER): Payer: Medicare Other | Admitting: Pharmacist

## 2021-04-10 DIAGNOSIS — Z7901 Long term (current) use of anticoagulants: Secondary | ICD-10-CM | POA: Diagnosis not present

## 2021-04-10 DIAGNOSIS — Z5181 Encounter for therapeutic drug level monitoring: Secondary | ICD-10-CM | POA: Diagnosis not present

## 2021-04-10 DIAGNOSIS — Z952 Presence of prosthetic heart valve: Secondary | ICD-10-CM | POA: Diagnosis not present

## 2021-04-10 DIAGNOSIS — I69359 Hemiplegia and hemiparesis following cerebral infarction affecting unspecified side: Secondary | ICD-10-CM

## 2021-04-10 LAB — POCT INR: INR: 4.1 — AB (ref 2.0–3.0)

## 2021-04-13 ENCOUNTER — Ambulatory Visit (INDEPENDENT_AMBULATORY_CARE_PROVIDER_SITE_OTHER): Payer: Medicare Other | Admitting: Pharmacist

## 2021-04-13 DIAGNOSIS — Z7901 Long term (current) use of anticoagulants: Secondary | ICD-10-CM | POA: Diagnosis not present

## 2021-04-13 DIAGNOSIS — Z952 Presence of prosthetic heart valve: Secondary | ICD-10-CM | POA: Diagnosis not present

## 2021-04-13 DIAGNOSIS — Z5181 Encounter for therapeutic drug level monitoring: Secondary | ICD-10-CM | POA: Diagnosis not present

## 2021-04-13 DIAGNOSIS — I69359 Hemiplegia and hemiparesis following cerebral infarction affecting unspecified side: Secondary | ICD-10-CM

## 2021-04-13 LAB — POCT INR: INR: 2.3 (ref 2.0–3.0)

## 2021-04-13 NOTE — Patient Instructions (Signed)
Description   Spoke with patient and instructed pt to take 2 tablets today and 1.5 tablets tomorrow, then continue taking 5mg  daily except 7.5mg  on Monday, Wednesday, and Friday. Recheck INR in 1 week. Coumadin Clinic [phone].

## 2021-04-15 DIAGNOSIS — E7800 Pure hypercholesterolemia, unspecified: Secondary | ICD-10-CM | POA: Diagnosis not present

## 2021-04-15 DIAGNOSIS — Z952 Presence of prosthetic heart valve: Secondary | ICD-10-CM | POA: Diagnosis not present

## 2021-04-15 DIAGNOSIS — I129 Hypertensive chronic kidney disease with stage 1 through stage 4 chronic kidney disease, or unspecified chronic kidney disease: Secondary | ICD-10-CM | POA: Diagnosis not present

## 2021-04-15 DIAGNOSIS — Z1331 Encounter for screening for depression: Secondary | ICD-10-CM | POA: Diagnosis not present

## 2021-04-15 DIAGNOSIS — M810 Age-related osteoporosis without current pathological fracture: Secondary | ICD-10-CM | POA: Diagnosis not present

## 2021-04-15 DIAGNOSIS — Z0000 Encounter for general adult medical examination without abnormal findings: Secondary | ICD-10-CM | POA: Diagnosis not present

## 2021-04-15 DIAGNOSIS — N182 Chronic kidney disease, stage 2 (mild): Secondary | ICD-10-CM | POA: Diagnosis not present

## 2021-04-15 DIAGNOSIS — I69998 Other sequelae following unspecified cerebrovascular disease: Secondary | ICD-10-CM | POA: Diagnosis not present

## 2021-04-15 DIAGNOSIS — G8194 Hemiplegia, unspecified affecting left nondominant side: Secondary | ICD-10-CM | POA: Diagnosis not present

## 2021-04-15 DIAGNOSIS — R2689 Other abnormalities of gait and mobility: Secondary | ICD-10-CM | POA: Diagnosis not present

## 2021-04-15 DIAGNOSIS — R82998 Other abnormal findings in urine: Secondary | ICD-10-CM | POA: Diagnosis not present

## 2021-04-15 DIAGNOSIS — Z1212 Encounter for screening for malignant neoplasm of rectum: Secondary | ICD-10-CM | POA: Diagnosis not present

## 2021-04-15 DIAGNOSIS — Z1339 Encounter for screening examination for other mental health and behavioral disorders: Secondary | ICD-10-CM | POA: Diagnosis not present

## 2021-04-15 DIAGNOSIS — Z7901 Long term (current) use of anticoagulants: Secondary | ICD-10-CM | POA: Diagnosis not present

## 2021-04-15 DIAGNOSIS — R7301 Impaired fasting glucose: Secondary | ICD-10-CM | POA: Diagnosis not present

## 2021-04-15 DIAGNOSIS — D692 Other nonthrombocytopenic purpura: Secondary | ICD-10-CM | POA: Diagnosis not present

## 2021-04-20 ENCOUNTER — Ambulatory Visit (INDEPENDENT_AMBULATORY_CARE_PROVIDER_SITE_OTHER): Payer: Medicare Other | Admitting: *Deleted

## 2021-04-20 DIAGNOSIS — Z5181 Encounter for therapeutic drug level monitoring: Secondary | ICD-10-CM

## 2021-04-20 DIAGNOSIS — Z952 Presence of prosthetic heart valve: Secondary | ICD-10-CM | POA: Diagnosis not present

## 2021-04-20 DIAGNOSIS — Z7901 Long term (current) use of anticoagulants: Secondary | ICD-10-CM | POA: Diagnosis not present

## 2021-04-20 LAB — POCT INR: INR: 3.5 — AB (ref 2.0–3.0)

## 2021-04-20 NOTE — Patient Instructions (Signed)
Description   Spoke with patient and instructed pt to take 5mg  today then continue taking 5mg  daily except 7.5mg  on Monday, Wednesday, and Friday. Recheck INR in 1 week. Coumadin Clinic [phone].

## 2021-04-27 ENCOUNTER — Ambulatory Visit (INDEPENDENT_AMBULATORY_CARE_PROVIDER_SITE_OTHER): Payer: Medicare Other

## 2021-04-27 DIAGNOSIS — I69359 Hemiplegia and hemiparesis following cerebral infarction affecting unspecified side: Secondary | ICD-10-CM

## 2021-04-27 DIAGNOSIS — Z7901 Long term (current) use of anticoagulants: Secondary | ICD-10-CM

## 2021-04-27 DIAGNOSIS — Z952 Presence of prosthetic heart valve: Secondary | ICD-10-CM | POA: Diagnosis not present

## 2021-04-27 DIAGNOSIS — Z5181 Encounter for therapeutic drug level monitoring: Secondary | ICD-10-CM | POA: Diagnosis not present

## 2021-04-27 LAB — POCT INR: INR: 4.6 — AB (ref 2.0–3.0)

## 2021-04-27 NOTE — Patient Instructions (Signed)
Description   Spoke with patient and instructed pt to skip today's dosage of Warfarin, then resume same dosage 5mg  daily except 7.5mg  on Mondays, Wednesdays, and Fridays. Recheck INR in 1 week. Coumadin Clinic [phone].

## 2021-04-28 ENCOUNTER — Telehealth: Payer: Self-pay | Admitting: Cardiovascular Disease

## 2021-04-28 NOTE — Telephone Encounter (Signed)
New Message:      Barb calling to report an out of range INR.

## 2021-04-28 NOTE — Telephone Encounter (Signed)
Spoke with representative from INR who is reporting INR of 4.6 from yesterday 6/20.  See previous phone note, this has been addressed.

## 2021-05-04 ENCOUNTER — Ambulatory Visit (INDEPENDENT_AMBULATORY_CARE_PROVIDER_SITE_OTHER): Payer: Medicare Other | Admitting: Pharmacist

## 2021-05-04 DIAGNOSIS — Z7901 Long term (current) use of anticoagulants: Secondary | ICD-10-CM | POA: Diagnosis not present

## 2021-05-04 DIAGNOSIS — Z5181 Encounter for therapeutic drug level monitoring: Secondary | ICD-10-CM | POA: Diagnosis not present

## 2021-05-04 DIAGNOSIS — I69359 Hemiplegia and hemiparesis following cerebral infarction affecting unspecified side: Secondary | ICD-10-CM

## 2021-05-04 DIAGNOSIS — Z952 Presence of prosthetic heart valve: Secondary | ICD-10-CM | POA: Diagnosis not present

## 2021-05-04 LAB — POCT INR: INR: 2.9 (ref 2.0–3.0)

## 2021-05-04 NOTE — Patient Instructions (Signed)
Description   Spoke with patient and instructed pt to take 10mg  of Warfarin today, then resume same dosage 5mg  daily except 7.5mg  on Mondays, Wednesdays, and Fridays. Recheck INR in 1 week. Coumadin Clinic [phone].

## 2021-05-12 ENCOUNTER — Ambulatory Visit (INDEPENDENT_AMBULATORY_CARE_PROVIDER_SITE_OTHER): Payer: Medicare Other | Admitting: Interventional Cardiology

## 2021-05-12 DIAGNOSIS — Z5181 Encounter for therapeutic drug level monitoring: Secondary | ICD-10-CM | POA: Diagnosis not present

## 2021-05-12 LAB — POCT INR: INR: 3.6 — AB (ref 2.0–3.0)

## 2021-05-12 NOTE — Patient Instructions (Signed)
Description   Spoke with patient and instructed pt to continue same dosage of warfarin 5mg  daily except 7.5mg  on Mondays, Wednesdays, and Fridays. Instructed pt to eat an extra serving of broccoli today.  Recheck INR in 1 week. Coumadin Clinic [phone].

## 2021-05-13 ENCOUNTER — Telehealth: Payer: Self-pay | Admitting: Cardiovascular Disease

## 2021-05-13 ENCOUNTER — Telehealth: Payer: Self-pay | Admitting: Physician Assistant

## 2021-05-13 NOTE — Telephone Encounter (Signed)
Calling with outrange INR

## 2021-05-13 NOTE — Telephone Encounter (Signed)
Call was disconnect as I was trying to xfer to triage

## 2021-05-13 NOTE — Telephone Encounter (Signed)
[facility] was calling to report an out of range INR for this patient

## 2021-05-13 NOTE — Telephone Encounter (Signed)
See other encounter today for documentation on this.  Handled by Coumadin clinic.

## 2021-05-13 NOTE — Telephone Encounter (Signed)
INR of 3.6 was reported by patient on yesterday and by faxed lab result; which was it was addressed by the Anticoagulation Clinic on yesterday.   Returned call to [facility] and spoke with Theadora Rama and she reported the same result that was 3.6 on yesterday; advised that it had been addressed. Asked how we could address the notification range and she stated to send in a request to change INR notification on rx pad with MD signature.   Pt had a stroke in the past and her notification range is below 2.0 and above 3.5; will update as we need to know her INR before it is below 2.0 since the calls are delayed to the office from [facility] and her recent stroke.   Spoke with [facility] PharmD and she states that would be better as pt goal INR range is 3.0-3.5. Will prepare for Dr. Acie Fredrickson to address and sign once he is back in the office.

## 2021-05-18 ENCOUNTER — Ambulatory Visit (INDEPENDENT_AMBULATORY_CARE_PROVIDER_SITE_OTHER): Payer: Medicare Other | Admitting: *Deleted

## 2021-05-18 ENCOUNTER — Telehealth: Payer: Self-pay | Admitting: Cardiovascular Disease

## 2021-05-18 DIAGNOSIS — I69359 Hemiplegia and hemiparesis following cerebral infarction affecting unspecified side: Secondary | ICD-10-CM

## 2021-05-18 DIAGNOSIS — Z5181 Encounter for therapeutic drug level monitoring: Secondary | ICD-10-CM | POA: Diagnosis not present

## 2021-05-18 DIAGNOSIS — Z7901 Long term (current) use of anticoagulants: Secondary | ICD-10-CM

## 2021-05-18 DIAGNOSIS — Z952 Presence of prosthetic heart valve: Secondary | ICD-10-CM

## 2021-05-18 LAB — POCT INR: INR: 3.9 — AB (ref 2.0–3.0)

## 2021-05-18 NOTE — Telephone Encounter (Signed)
See anticoag encounter from today for further documentation °

## 2021-05-18 NOTE — Telephone Encounter (Signed)
Call From [facility] to report an out of range INR

## 2021-05-25 ENCOUNTER — Ambulatory Visit (INDEPENDENT_AMBULATORY_CARE_PROVIDER_SITE_OTHER): Payer: Medicare Other | Admitting: Cardiovascular Disease

## 2021-05-25 DIAGNOSIS — Z5181 Encounter for therapeutic drug level monitoring: Secondary | ICD-10-CM | POA: Diagnosis not present

## 2021-05-25 LAB — POCT INR: INR: 3 (ref 2.0–3.0)

## 2021-05-25 NOTE — Patient Instructions (Signed)
Description   Spoke with patient and instructed pt to continue same dosage of warfarin 5mg  daily except 7.5mg  on Mondays, Wednesdays, and Fridays. Continue eating normal green intake and remain consistent.  Recheck INR in 1 week. Coumadin Clinic [phone].

## 2021-06-01 ENCOUNTER — Ambulatory Visit (INDEPENDENT_AMBULATORY_CARE_PROVIDER_SITE_OTHER): Payer: Medicare Other

## 2021-06-01 DIAGNOSIS — I69359 Hemiplegia and hemiparesis following cerebral infarction affecting unspecified side: Secondary | ICD-10-CM | POA: Diagnosis not present

## 2021-06-01 DIAGNOSIS — Z5181 Encounter for therapeutic drug level monitoring: Secondary | ICD-10-CM | POA: Diagnosis not present

## 2021-06-01 DIAGNOSIS — Z952 Presence of prosthetic heart valve: Secondary | ICD-10-CM

## 2021-06-01 DIAGNOSIS — Z7901 Long term (current) use of anticoagulants: Secondary | ICD-10-CM

## 2021-06-01 LAB — POCT INR: INR: 3.5 — AB (ref 2.0–3.0)

## 2021-06-01 NOTE — Patient Instructions (Signed)
Description   Spoke with patient and instructed pt to continue same dosage of warfarin '5mg'$  daily except 7.'5mg'$  on Mondays, Wednesdays, and Fridays. Continue eating normal green intake and remain consistent.  Recheck INR in 1 week. Coumadin Clinic [phone].

## 2021-06-02 ENCOUNTER — Ambulatory Visit: Payer: Medicare Other | Admitting: Adult Health

## 2021-06-07 DIAGNOSIS — I129 Hypertensive chronic kidney disease with stage 1 through stage 4 chronic kidney disease, or unspecified chronic kidney disease: Secondary | ICD-10-CM | POA: Diagnosis not present

## 2021-06-07 DIAGNOSIS — N182 Chronic kidney disease, stage 2 (mild): Secondary | ICD-10-CM | POA: Diagnosis not present

## 2021-06-07 DIAGNOSIS — E7800 Pure hypercholesterolemia, unspecified: Secondary | ICD-10-CM | POA: Diagnosis not present

## 2021-06-08 ENCOUNTER — Ambulatory Visit (INDEPENDENT_AMBULATORY_CARE_PROVIDER_SITE_OTHER): Payer: Medicare Other

## 2021-06-08 DIAGNOSIS — Z5181 Encounter for therapeutic drug level monitoring: Secondary | ICD-10-CM | POA: Diagnosis not present

## 2021-06-08 DIAGNOSIS — I639 Cerebral infarction, unspecified: Secondary | ICD-10-CM | POA: Diagnosis not present

## 2021-06-08 LAB — POCT INR: INR: 3.2 — AB (ref 2.0–3.0)

## 2021-06-08 NOTE — Patient Instructions (Signed)
Description   Spoke with patient and instructed pt to continue same dosage of warfarin '5mg'$  daily except 7.'5mg'$  on Mondays, Wednesdays, and Fridays. Continue eating normal green intake and remain consistent.  Recheck INR in 1 week. Coumadin Clinic [phone].

## 2021-06-09 ENCOUNTER — Encounter: Payer: Self-pay | Admitting: Physician Assistant

## 2021-06-09 ENCOUNTER — Encounter: Payer: Self-pay | Admitting: Physical Medicine & Rehabilitation

## 2021-06-09 ENCOUNTER — Ambulatory Visit (INDEPENDENT_AMBULATORY_CARE_PROVIDER_SITE_OTHER): Payer: Medicare Other | Admitting: Physician Assistant

## 2021-06-09 ENCOUNTER — Encounter: Payer: Medicare Other | Attending: Physical Medicine & Rehabilitation | Admitting: Physical Medicine & Rehabilitation

## 2021-06-09 ENCOUNTER — Other Ambulatory Visit: Payer: Self-pay

## 2021-06-09 VITALS — BP 120/60 | HR 72 | Ht 67.0 in | Wt 127.6 lb

## 2021-06-09 VITALS — BP 134/61 | HR 53 | Temp 97.8°F | Ht 67.0 in | Wt 126.0 lb

## 2021-06-09 DIAGNOSIS — G8194 Hemiplegia, unspecified affecting left nondominant side: Secondary | ICD-10-CM | POA: Insufficient documentation

## 2021-06-09 DIAGNOSIS — E785 Hyperlipidemia, unspecified: Secondary | ICD-10-CM

## 2021-06-09 DIAGNOSIS — I10 Essential (primary) hypertension: Secondary | ICD-10-CM | POA: Diagnosis not present

## 2021-06-09 DIAGNOSIS — I639 Cerebral infarction, unspecified: Secondary | ICD-10-CM

## 2021-06-09 DIAGNOSIS — Z952 Presence of prosthetic heart valve: Secondary | ICD-10-CM | POA: Diagnosis not present

## 2021-06-09 DIAGNOSIS — I69359 Hemiplegia and hemiparesis following cerebral infarction affecting unspecified side: Secondary | ICD-10-CM

## 2021-06-09 MED ORDER — ATORVASTATIN CALCIUM 40 MG PO TABS: 40.0000 mg | ORAL_TABLET | Freq: Every day | ORAL | 3 refills | Status: DC

## 2021-06-09 NOTE — Patient Instructions (Signed)
Please try hip adduction exercises, squeez knees together 3 sets of 10 repetition

## 2021-06-09 NOTE — Patient Instructions (Signed)
Medication Instructions:   INCREASE ATORVASTATIN one tablet by mouth ( 40 mg) daily.  *If you need a refill on your cardiac medications before your next appointment, please call your pharmacy*   Lab Work: Your physician recommends that you return for a FASTING lipid profile/lft on Wednesday, November 9 between 7:30-4:30 fasting from midnight the night before.  If you have labs (blood work) drawn today and your tests are completely normal, you will receive your results only by: [place] (if you have MyChart) OR A paper copy in the mail If you have any lab test that is abnormal or we need to change your treatment, we will call you to review the results.   Testing/Procedures:  -NONE  Follow-Up: At [facility], you and your health needs are our priority.  As part of our continuing mission to provide you with exceptional heart care, we have created designated Provider Care Teams.  These Care Teams include your primary Cardiologist (physician) and Advanced Practice Providers (APPs -  Physician Assistants and Nurse Practitioners) who all work together to provide you with the care you need, when you need it.  We recommend signing up for the patient portal called "MyChart".  Sign up information is provided on this After Visit Summary.  MyChart is used to connect with patients for Virtual Visits (Telemedicine).  Patients are able to view lab/test results, encounter notes, upcoming appointments, etc.  Non-urgent messages can be sent to your provider as well.   To learn more about what you can do with MyChart, go to [email or web address].    Your next appointment:   6 month(s)with Dr. Cathie Olden February 15 @ 1:20 pm   The format for your next appointment:   In Person  Provider:   Mertie Moores, MD   Other Instructions  -NONE-

## 2021-06-09 NOTE — Progress Notes (Addendum)
Cardiology Office Note:    Date:  06/09/2021   ID:  [name], DOB February 19, 1946, MRN [id number]  PCP:  Haywood Pao, MD   [facility] Providers Cardiologist:  Mertie Moores, MD      Referring MD: Haywood Pao, MD   Chief Complaint:  Follow-up (Hx of mechanical MVR)    Patient Profile:    [name] is a 75 y.o. 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 Mild mixed plaque origin ICA. 1-39% ICA plaquing. Vertebral artery flow is antegrade.  Echo 7/17 EF 55-60, normal wall motion, bileaflet mechanical mitral valve prosthesis functioning normally, mild LAE, mildly reduced RVSF, small pericardial effusion  History of Present Illness: [name] was last seen by Dr. Acie Fredrickson in 1/22.  She returns for f/u.  She is here alone.  She is overall doing well.  She has not had chest pain, significant shortness of breath, orthopnea, syncope, leg edema.  She wears compression stockings.  She has an AFO on the L.      Past Medical History:  Diagnosis Date   Abnormality of gait 09/07/2016   Allergy    Diabetes mellitus without complication [facility])    Patient denies this - notes history of glucose intolerance   GERD (gastroesophageal reflux disease)    Hemiparesis and alteration of sensations as late effects of stroke ([place]) 09/07/2016   History of pneumonia 1997   Hypertension     S/P MVR (mitral valve replacement)    Mechanical mitral valve replacement at age 59 (done in [place])  // echo 7/17: EF 55-60, normal wall motion, bileaflet mechanical mitral valve prosthesis functioning normally, mild LAE, mildly reduced RVSF, small pericardial effusion   Stroke ([place]) 1997, 2013, 2015    Current Medications: Current Meds  Medication Sig   acetaminophen (TYLENOL) 325 MG tablet Take 2 tablets (650 mg total) by mouth every 4 (four) hours as needed for mild pain (or temp > 37.5 C (99.5 F)).   amLODipine (NORVASC) 5 MG tablet Take 1 tablet (5 mg total) by mouth at bedtime.   aspirin EC 81 MG tablet Take 81 mg by mouth daily.    benzonatate (TESSALON) 100 MG capsule Take by mouth 3 (three) times daily as needed for cough.   calcium carbonate 1250 MG capsule Take 1,200 mg by mouth at bedtime.    cholecalciferol (VITAMIN D) 1000 UNITS tablet Take 2,000 Units daily by mouth.    denosumab (PROLIA) 60 MG/ML SOSY injection Inject 60 mg into the skin every 6 (six) months.   docusate sodium (COLACE) 100 MG capsule Take 100 mg at bedtime by mouth.    Menthol, Topical Analgesic, (BENGAY EX) Apply 1 application topically daily. All painful areas   omeprazole (PRILOSEC) 20 MG capsule Take 20 mg by mouth daily.   Propylene Glycol-Glycerin (SOOTHE) 0.6-0.6 % SOLN Place 1 drop into both eyes 2 (two) times daily as needed (dry eyes).   warfarin (COUMADIN)  5 MG tablet Take as directed by Coumadin Clinic   [DISCONTINUED] atorvastatin (LIPITOR) 20 MG tablet Take 1 tablet by mouth daily.     Allergies:   Zoloft [sertraline]   Social History   Tobacco Use   Smoking status: Never   Smokeless tobacco: Never  Vaping Use   Vaping Use: Never used  Substance Use Topics   Alcohol use: No    Alcohol/week: 0.0 standard drinks   Drug use: No     Family Hx: The patient's family history includes Cancer in her mother; Heart disease in her mother; Hyperlipidemia in her mother; Hypertension in her  father and mother; Stroke in her father. There is no history of Heart attack.  Review of Systems  Gastrointestinal:  Negative for hematochezia and melena.  Genitourinary:  Negative for hematuria.    EKGs/Labs/Other Test Reviewed:    EKG:  EKG is  ordered today.  The ekg ordered today demonstrates normal sinus rhythm, HR 72, LAD, 1st degree AVB, PR 263 ms, low voltage, no ST-TW changes, QTc 431 ms   Recent Labs: 07/07/2020: ALT 23; BUN 28; Creatinine, Ser 0.80; Potassium 4.0; Sodium 140 07/14/2020: Hemoglobin 12.8; Platelets 176   Recent Lipid Panel Lab Results  Component Value Date/Time   CHOL 148 07/02/2020 05:00 AM   TRIG 82 07/02/2020 05:00 AM   HDL 57 07/02/2020 05:00 AM   LDLCALC 75 07/02/2020 05:00 AM      Risk Assessment/Calculations:      Physical Exam:    VS:  BP 120/60   Pulse 72   Ht '5\' 7"'$  (1.702 m)   Wt 127 lb 9.6 oz (57.9 kg)   SpO2 96%   BMI 19.98 kg/m     Wt Readings from Last 3 Encounters:  06/09/21 127 lb 9.6 oz (57.9 kg)  06/09/21 126 lb (57.2 kg)  12/12/20 128 lb 6.4 oz (58.2 kg)     Constitutional:      Appearance: Healthy appearance. Not in distress.  Neck:     Thyroid: No thyromegaly.     Vascular: JVD normal.  Pulmonary:     Breath sounds: No wheezing. No rales.  Cardiovascular:     Normal rate. Regular rhythm. S1. mechanical Normal S2.      Murmurs: There is no murmur.  Edema:    Peripheral edema absent.     Comments: Compression stockings in place Abdominal:     Palpations: Abdomen is soft.  Skin:    General: Skin is warm and dry.  Neurological:     General: No focal deficit present.     Mental Status: Alert and oriented to person, place and time.         ASSESSMENT & PLAN:    1. H/O mitral valve replacement with mechanical valve Normally functioning mitral valve prosthesis by echocardiogram in 8/21.  Continue warfarin plus aspirin given prior history of multiple strokes.  Goal INR 3-3.5.  Continue SBE prophylaxis.  Follow-up  in 6 months Lab Results  Component Value Date   INR 3.2 (A) 06/08/2021   INR 3.5 (A) 06/01/2021   INR 3.0 05/25/2021     2. Essential hypertension The patient's blood pressure is controlled on her current regimen.  Continue current dose of amlodipine.   3. Hyperlipidemia, unspecified hyperlipidemia type Recent LDL above target at 81.  Given prior history of stroke, would aim for an LDL <70.  Increase atorvastatin to 40 mg daily.  Obtain lipids and LFTs in 3 months.  4. 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  She was seen by Dr. Letta Pate with PM&R earlier today.   Dispo:  Return in about 6 months (around 12/10/2021) for Routine follow up in 6 months with Dr.Nahser. .   Medication Adjustments/Labs and Tests Ordered: Current medicines are reviewed at length with the patient today.  Concerns regarding medicines are outlined above.  Tests Ordered: Orders Placed This Encounter  Procedures   Hepatic function panel   Lipid Profile   EKG 12-Lead    Medication Changes: Meds ordered this encounter  Medications   atorvastatin (LIPITOR) 40 MG tablet    Sig: Take 1 tablet (40 mg total) by mouth daily.    Dispense:  90 tablet    Refill:  3     Signed, Richardson Dopp, PA-C  06/09/2021 5:54 PM    [facility] [address], [place], [place]  [address] Phone: [phone]; Fax: [phone]

## 2021-06-09 NOTE — Progress Notes (Signed)
Subjective:    Patient ID: [name], female    DOB: 1946-05-20, 75 y.o.   MRN: [id number]  HPI P75 y.o. 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  Per chart review lives with spouse.  Used a cane prior to admission and a left AFO.  She has a son in [place].  Presented 07/01/2020 with dizziness and weakness x3 days.  MRI showed single small cortical subcortical ischemic nonhemorrhagic infarct involving the parasagittal posterior left frontal lobe.  Underlying moderate to advanced age-related cerebral atrophy with chronic microvascular ischemic disease with multiple remote lacunar infarcts involving the left thalamus and bilateral cerebellar hemispheres.  CT angiogram of head and neck with no emergent findings.  Patient did not receive TPA.  Admission chemistries INR 2.9 hemoglobin A1c 6.1 chemistries unremarkable.  Echocardiogram with ejection fraction of 60% no wall motion abnormalities.  Chronic Coumadin ongoing with a goal INR of 3.0-3.5.  Tolerating regular diet.  Patient was admitted for a comprehensive rehab program. Admit date: 07/04/2020 Discharge date: 07/16/2020  HPI  CC:  Left leg weakness/dragging Notices Left foot weakness even when wearing AFO, no pain, no new numbness.  The patient cannot describe when this came on.  She has continued to be normally active.  Does not think that her activity level has gone up or down. [place] about 1 month ago but no injury   Sitting on kitchen chair, fell off, had some bruising, on elbow, no head trauma  Still doing 45-10mn of exercise per day   Has had cardiology f/u, frequent checks of INR   pain Inventory Average Pain 2 Pain Right Now 2 My pain is dull and aching  In the last 24 hours, has pain interfered with the following? General  activity 5 Relation with others 9 Enjoyment of life 10 What TIME of day is your pain at its worst? morning  Sleep (in general) Good  Pain is worse with: bending and standing Pain improves with: rest, heat/ice, therapy/exercise, and pacing activities Relief from Meds: 7  Family History  Problem Relation Age of Onset   Cancer Mother        Bone   Heart disease Mother    Hyperlipidemia Mother    Hypertension Mother    Stroke Father    Hypertension Father    Heart attack Neg Hx    Social History   Socioeconomic History   Marital status: Married    Spouse name: Not on file   Number of children: 2   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   Highest education level: Not on file  Occupational History   Occupation: Retired  Tobacco Use   Smoking status: Never   Smokeless tobacco: Never  Vaping Use   Vaping Use: Never used  Substance and Sexual Activity   Alcohol use: No    Alcohol/week: 0.0 standard drinks   Drug use: No   Sexual activity: Not on file    Comment: Married  Other Topics Concern   Not on file  Social History Narrative   Lives at home w/ her husband   Right-handed   Caffeine: none   Social Determinants of HRadio broadcast assistantStrain: Not on file  Food Insecurity: No Food Insecurity   Worried About RCharity fundraiserin the Last Year: Never true   [employer]  in the Last Year: Never true  Transportation Needs: No Transportation Needs   Lack of Transportation (Medical): No   Lack of Transportation (Non-Medical): No  Physical Activity: Not on file  Stress: Not on file  Social Connections: Not on file   Past Surgical History:  Procedure Laterality Date   [place]   Past Surgical History:  Procedure Laterality Date   [place]   Past Medical History:  Diagnosis Date   Abnormality of gait 09/07/2016   Allergy    Diabetes mellitus without complication ([place])    Patient denies this - notes history of glucose intolerance   GERD (gastroesophageal reflux disease)    Hemiparesis and alteration of sensations as late effects of stroke ([place]) 09/07/2016   History of pneumonia 1997   Hypertension    S/P MVR (mitral valve replacement)    Mechanical mitral valve replacement at age 55 (done in [place])  // echo 7/17: EF 55-60, normal wall motion, bileaflet mechanical mitral valve prosthesis functioning normally, mild LAE, mildly reduced RVSF, small pericardial effusion   Stroke ([place]) 1997, 2013, 2015   BP 134/61 (BP Location: Right Arm)   Pulse (!) 53   Temp 97.8 F (36.6 C) (Oral)   Ht '5\' 7"'$  (1.702 m)   Wt 126 lb (57.2 kg)   SpO2 96%   BMI 19.73 kg/m   Opioid Risk Score:   Fall Risk Score:  `1  Depression screen PHQ 2/9  Depression screen [facility] 2/9 09/12/2020 07/29/2020 07/18/2020 10/22/2019 09/25/2019  Decreased Interest 0 0 0 0 0  Down, Depressed, Hopeless 0 0 0 0 0  PHQ - 2 Score 0 0 0 0 0  Altered sleeping - 1 - - -  Tired, decreased energy - 0 - - -  Change in appetite - 0 - - -  Feeling bad or failure about yourself  - 0 - - -  Trouble concentrating - 0 - - -  Moving slowly or fidgety/restless - 0 - - -  Suicidal thoughts - 0 - - -  PHQ-9 Score - 1 - - -  Some recent data might be hidden       Review of Systems  Constitutional: Negative.   HENT: Negative.    Eyes: Negative.   Respiratory: Negative.    Cardiovascular: Negative.   Gastrointestinal: Negative.   Endocrine: Negative.   Genitourinary: Negative.   Musculoskeletal:  Positive for back pain and gait problem.       Left hip pain ,   Skin: Negative.   Allergic/Immunologic: Negative.   Hematological: Negative.   Psychiatric/Behavioral: Negative.        Objective:   Physical Exam Vitals and nursing note reviewed.   Constitutional:      Appearance: She is obese.  HENT:     Head: Normocephalic and atraumatic.  Eyes:     General: No visual field deficit.    Extraocular Movements: Extraocular movements intact.     Pupils: Pupils are equal, round, and reactive to light.  Skin:    General: Skin is warm and dry.  Neurological:     Mental Status: She is alert and oriented to person, place, and time.  Cranial Nerves: Dysarthria present. No facial asymmetry.     Motor: Weakness and abnormal muscle tone present.     Coordination: Coordination abnormal. Finger-Nose-Finger Test abnormal. Impaired rapid alternating movements.     Comments: Reduced coordination left upper extremity. Motor strength is 5/5 in the right deltoid bicep tricep grip hip flexor knee extensor ankle dorsiflexor 4/5 in the left deltoid bicep tricep grip hip flexor knee extensor, ankle dorsiflexor plantar flexors not tested secondary to AFO. Sensation intact light touch in the upper and lower limbs Ambulates with a left AFO no other assistive device, has mild external rotation of the left lower limb.   Psychiatric:        Behavior: Behavior normal.          Assessment & Plan:  1.  Left hemiparesis secondary to remote right CVA.  No new deficits appreciated.  I do think she has some muscle imbalance but no new weakness.  Have instructed patient in performing hip adduction exercises. She is to continue follow-up with cardiology as well as neurology and primary care I will see the patient back on a as needed basis

## 2021-06-11 ENCOUNTER — Ambulatory Visit: Payer: Medicare Other | Admitting: Physical Medicine & Rehabilitation

## 2021-06-15 ENCOUNTER — Ambulatory Visit (INDEPENDENT_AMBULATORY_CARE_PROVIDER_SITE_OTHER): Payer: Medicare Other | Admitting: Pharmacist

## 2021-06-15 DIAGNOSIS — Z952 Presence of prosthetic heart valve: Secondary | ICD-10-CM | POA: Diagnosis not present

## 2021-06-15 DIAGNOSIS — Z5181 Encounter for therapeutic drug level monitoring: Secondary | ICD-10-CM

## 2021-06-15 DIAGNOSIS — Z7901 Long term (current) use of anticoagulants: Secondary | ICD-10-CM

## 2021-06-15 DIAGNOSIS — I69359 Hemiplegia and hemiparesis following cerebral infarction affecting unspecified side: Secondary | ICD-10-CM | POA: Diagnosis not present

## 2021-06-15 LAB — POCT INR: INR: 4.4 — AB (ref 2.0–3.0)

## 2021-06-15 NOTE — Patient Instructions (Signed)
Description   Spoke with patient and instructed pt to skip warfarin today, then continue same dosage of warfarin '5mg'$  daily except 7.'5mg'$  on Mondays, Wednesdays, and Fridays. Continue eating normal green intake and remain consistent.  Recheck INR in 1 week. Coumadin Clinic [phone].

## 2021-06-22 ENCOUNTER — Ambulatory Visit (INDEPENDENT_AMBULATORY_CARE_PROVIDER_SITE_OTHER): Payer: Medicare Other

## 2021-06-22 DIAGNOSIS — Z7901 Long term (current) use of anticoagulants: Secondary | ICD-10-CM | POA: Diagnosis not present

## 2021-06-22 DIAGNOSIS — Z5181 Encounter for therapeutic drug level monitoring: Secondary | ICD-10-CM | POA: Diagnosis not present

## 2021-06-22 LAB — POCT INR: INR: 3.7 — AB (ref 2.0–3.0)

## 2021-06-22 NOTE — Patient Instructions (Signed)
Description   Spoke with patient and instructed to take 1/2 tablet tonight an then continue same dosage of warfarin '5mg'$  daily except 7.'5mg'$  on Mondays, Wednesdays, and Fridays. Continue eating normal green intake and remain consistent.  Recheck INR in 1 week. Coumadin Clinic [phone].

## 2021-06-29 ENCOUNTER — Ambulatory Visit (INDEPENDENT_AMBULATORY_CARE_PROVIDER_SITE_OTHER): Payer: Medicare Other | Admitting: *Deleted

## 2021-06-29 DIAGNOSIS — Z5181 Encounter for therapeutic drug level monitoring: Secondary | ICD-10-CM | POA: Diagnosis not present

## 2021-06-29 DIAGNOSIS — Z952 Presence of prosthetic heart valve: Secondary | ICD-10-CM

## 2021-06-29 DIAGNOSIS — Z7901 Long term (current) use of anticoagulants: Secondary | ICD-10-CM

## 2021-06-29 DIAGNOSIS — I69359 Hemiplegia and hemiparesis following cerebral infarction affecting unspecified side: Secondary | ICD-10-CM | POA: Diagnosis not present

## 2021-06-29 LAB — POCT INR: INR: 3.5 — AB (ref 2.0–3.0)

## 2021-07-06 ENCOUNTER — Ambulatory Visit (INDEPENDENT_AMBULATORY_CARE_PROVIDER_SITE_OTHER): Payer: Medicare Other

## 2021-07-06 ENCOUNTER — Other Ambulatory Visit: Payer: Self-pay

## 2021-07-06 DIAGNOSIS — I69359 Hemiplegia and hemiparesis following cerebral infarction affecting unspecified side: Secondary | ICD-10-CM | POA: Diagnosis not present

## 2021-07-06 DIAGNOSIS — Z5181 Encounter for therapeutic drug level monitoring: Secondary | ICD-10-CM | POA: Diagnosis not present

## 2021-07-06 DIAGNOSIS — Z7901 Long term (current) use of anticoagulants: Secondary | ICD-10-CM

## 2021-07-06 DIAGNOSIS — Z952 Presence of prosthetic heart valve: Secondary | ICD-10-CM | POA: Diagnosis not present

## 2021-07-06 LAB — POCT INR: INR: 3.8 — AB (ref 2.0–3.0)

## 2021-07-06 MED ORDER — WARFARIN SODIUM 5 MG PO TABS: ORAL_TABLET | ORAL | 3 refills | Status: DC

## 2021-07-06 NOTE — Patient Instructions (Signed)
Description   Spoke with patient and instructed to start taking Warfarin '5mg'$  daily except 7.'5mg'$  on Mondays and Fridays. Eat something green and leafy today, then continue eating normal green intake and remain consistent.  Recheck INR in 1 week. Coumadin Clinic [phone].

## 2021-07-14 ENCOUNTER — Ambulatory Visit (INDEPENDENT_AMBULATORY_CARE_PROVIDER_SITE_OTHER): Payer: Medicare Other | Admitting: *Deleted

## 2021-07-14 DIAGNOSIS — I69359 Hemiplegia and hemiparesis following cerebral infarction affecting unspecified side: Secondary | ICD-10-CM

## 2021-07-14 DIAGNOSIS — Z5181 Encounter for therapeutic drug level monitoring: Secondary | ICD-10-CM

## 2021-07-14 DIAGNOSIS — Z7901 Long term (current) use of anticoagulants: Secondary | ICD-10-CM

## 2021-07-14 DIAGNOSIS — Z952 Presence of prosthetic heart valve: Secondary | ICD-10-CM | POA: Diagnosis not present

## 2021-07-14 LAB — POCT INR: INR: 4.4 — AB (ref 2.0–3.0)

## 2021-07-14 NOTE — Patient Instructions (Signed)
Description   Spoke with patient and instructed to start the dose you should be taking, which is, Warfarin '5mg'$  daily except 7.'5mg'$  on Mondays and Fridays. Eat something green and leafy today, then continue eating normal green intake and remain consistent.  Recheck INR in 1 week. Coumadin Clinic [phone].

## 2021-07-18 DIAGNOSIS — Z23 Encounter for immunization: Secondary | ICD-10-CM | POA: Diagnosis not present

## 2021-07-20 ENCOUNTER — Ambulatory Visit (INDEPENDENT_AMBULATORY_CARE_PROVIDER_SITE_OTHER): Payer: Medicare Other

## 2021-07-20 DIAGNOSIS — Z7901 Long term (current) use of anticoagulants: Secondary | ICD-10-CM

## 2021-07-20 DIAGNOSIS — Z5181 Encounter for therapeutic drug level monitoring: Secondary | ICD-10-CM | POA: Diagnosis not present

## 2021-07-20 LAB — POCT INR: INR: 3.1 — AB (ref 2.0–3.0)

## 2021-07-20 NOTE — Patient Instructions (Signed)
Description   Spoke with patient and instructed to continue taking Warfarin '5mg'$  daily except 7.'5mg'$  on Mondays and Fridays and continue eating normal green intake and remain consistent.  Recheck INR in 1 week. Coumadin Clinic [phone].

## 2021-07-27 ENCOUNTER — Ambulatory Visit (INDEPENDENT_AMBULATORY_CARE_PROVIDER_SITE_OTHER): Payer: Medicare Other | Admitting: Cardiology

## 2021-07-27 DIAGNOSIS — Z5181 Encounter for therapeutic drug level monitoring: Secondary | ICD-10-CM

## 2021-07-27 LAB — POCT INR: INR: 3.1 — AB (ref 2.0–3.0)

## 2021-07-27 NOTE — Patient Instructions (Signed)
Description   Spoke with patient and instructed to continue taking Warfarin '5mg'$  daily except 7.'5mg'$  on Mondays and Fridays and continue eating normal green intake and remain consistent.  Recheck INR in 1 week. Coumadin Clinic [phone].

## 2021-08-03 ENCOUNTER — Ambulatory Visit (INDEPENDENT_AMBULATORY_CARE_PROVIDER_SITE_OTHER): Payer: Medicare Other | Admitting: Interventional Cardiology

## 2021-08-03 DIAGNOSIS — L820 Inflamed seborrheic keratosis: Secondary | ICD-10-CM | POA: Diagnosis not present

## 2021-08-03 DIAGNOSIS — D485 Neoplasm of uncertain behavior of skin: Secondary | ICD-10-CM | POA: Diagnosis not present

## 2021-08-03 DIAGNOSIS — Z5181 Encounter for therapeutic drug level monitoring: Secondary | ICD-10-CM | POA: Diagnosis not present

## 2021-08-03 DIAGNOSIS — L908 Other atrophic disorders of skin: Secondary | ICD-10-CM | POA: Diagnosis not present

## 2021-08-03 DIAGNOSIS — B078 Other viral warts: Secondary | ICD-10-CM | POA: Diagnosis not present

## 2021-08-03 DIAGNOSIS — X32XXXD Exposure to sunlight, subsequent encounter: Secondary | ICD-10-CM | POA: Diagnosis not present

## 2021-08-03 DIAGNOSIS — L570 Actinic keratosis: Secondary | ICD-10-CM | POA: Diagnosis not present

## 2021-08-03 LAB — POCT INR: INR: 3.1 — AB (ref 2.0–3.0)

## 2021-08-03 NOTE — Patient Instructions (Addendum)
Description   Spoke with patient and instructed her to continue taking Warfarin 5mg  daily except 7.5mg  on Mondays and Fridays. Be consistent with green intake. Recheck INR in 1 week. Coumadin Clinic [phone].

## 2021-08-05 ENCOUNTER — Telehealth: Payer: Self-pay | Admitting: Cardiovascular Disease

## 2021-08-05 NOTE — Telephone Encounter (Signed)
Called patient. Not sure who called. Informed her that she had lab work that needed to be scheduled. Scheduled lab work and informed her that she has an appointment in February with Dr. Acie Fredrickson. Patient thank me for the call back.

## 2021-08-05 NOTE — Telephone Encounter (Signed)
Pt is returning call from earlier today. Pt states she is not sure why heartcare would be calling her. Please advise pt further

## 2021-08-07 DIAGNOSIS — E7800 Pure hypercholesterolemia, unspecified: Secondary | ICD-10-CM | POA: Diagnosis not present

## 2021-08-07 DIAGNOSIS — I129 Hypertensive chronic kidney disease with stage 1 through stage 4 chronic kidney disease, or unspecified chronic kidney disease: Secondary | ICD-10-CM | POA: Diagnosis not present

## 2021-08-07 DIAGNOSIS — N182 Chronic kidney disease, stage 2 (mild): Secondary | ICD-10-CM | POA: Diagnosis not present

## 2021-08-10 ENCOUNTER — Ambulatory Visit (INDEPENDENT_AMBULATORY_CARE_PROVIDER_SITE_OTHER): Payer: Medicare Other

## 2021-08-10 DIAGNOSIS — Z5181 Encounter for therapeutic drug level monitoring: Secondary | ICD-10-CM | POA: Diagnosis not present

## 2021-08-10 DIAGNOSIS — Z7901 Long term (current) use of anticoagulants: Secondary | ICD-10-CM

## 2021-08-10 DIAGNOSIS — Z952 Presence of prosthetic heart valve: Secondary | ICD-10-CM | POA: Diagnosis not present

## 2021-08-10 LAB — POCT INR: INR: 3.1 — AB (ref 2.0–3.0)

## 2021-08-10 NOTE — Patient Instructions (Signed)
Description   Spoke with patient and instructed her to continue taking Warfarin 5mg  daily except 7.5mg  on Mondays and Fridays. Be consistent with green intake. Recheck INR in 1 week. Coumadin Clinic [phone].

## 2021-08-12 ENCOUNTER — Other Ambulatory Visit: Payer: Self-pay

## 2021-08-12 ENCOUNTER — Ambulatory Visit (INDEPENDENT_AMBULATORY_CARE_PROVIDER_SITE_OTHER): Payer: Medicare Other | Admitting: Adult Health

## 2021-08-12 ENCOUNTER — Encounter: Payer: Self-pay | Admitting: Adult Health

## 2021-08-12 VITALS — BP 112/68 | HR 76 | Ht 67.0 in | Wt 129.0 lb

## 2021-08-12 DIAGNOSIS — Z7901 Long term (current) use of anticoagulants: Secondary | ICD-10-CM

## 2021-08-12 DIAGNOSIS — G8194 Hemiplegia, unspecified affecting left nondominant side: Secondary | ICD-10-CM

## 2021-08-12 DIAGNOSIS — I6930 Unspecified sequelae of cerebral infarction: Secondary | ICD-10-CM

## 2021-08-12 DIAGNOSIS — E785 Hyperlipidemia, unspecified: Secondary | ICD-10-CM

## 2021-08-12 DIAGNOSIS — I10 Essential (primary) hypertension: Secondary | ICD-10-CM

## 2021-08-12 NOTE — Patient Instructions (Signed)
Continue aspirin 81 mg daily and warfarin daily  and atorvastatin for secondary stroke prevention  Routine follow-up with cardiology for warfarin and INR level monitoring and management  Continue to follow up with PCP regarding cholesterol and blood pressure management  Maintain strict control of hypertension with blood pressure goal below 130/90 and cholesterol with LDL cholesterol (bad cholesterol) goal below 70 mg/dL.       Followup in the future with me in 1 year or call earlier if needed       Thank you for coming to see [place] at [facility]. I hope we have been able to provide you high quality care today.  You may receive a patient satisfaction survey over the next few weeks. We would appreciate your feedback and comments so that we may continue to improve ourselves and the health of our patients.

## 2021-08-12 NOTE — Progress Notes (Signed)
[facility] [address] [place]. [place] [address] [phone]       STROKE FOLLOW UP NOTE  [name] Date of Birth:  [birth year] Medical Record Number:  [id number]   Reason for Referral: stroke follow up    CHIEF COMPLAINT:  Chief Complaint  Patient presents with   Follow-up    Rm 2 alone Pt is well and stable, no new concerns. Still having some L sided weakness      HPI:  [name] is a 75 y.o. 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  Routinely followed in office for stroke follow-up    Update 08/12/2021 JM: Returns for overdue 69-month stroke follow-up unaccompanied.  Overall stable.  Denies new stroke/TIA symptoms.  Residual deficits stable.  Compliant on aspirin and warfarin as well as atorvastatin without side effects.  Cardiology increased atorvastatin dosage to 40 mg daily back in August for LDL 81 and plans on repeat lipid panel next month.  Routine follow-up with cardiology and monitoring of INR levels which have been stable.  Blood pressure today 112/68.  Monitors at home and similar to today's reading.  Reports recently speaking with pharmacist at Dr. Loren Massey office who advised her to ask about switching aspirin to Plavix due to recurrent strokes.  No further concerns at this time.    History provided for reference purposes only Update 12/03/2020 JM: [name] returns for 21-month stroke follow-up unaccompanied.  Doing well from a stroke standpoint without new stroke/TIA symptoms and reports residual left sided impairment which has been stable without worsening.   She has remained on  warfarin and aspirin 81 mg daily without bleeding or bruising but with fluctuation of INR levels. She monitors at home every Monday and follows with coumadin clinic and cardiology. Remains on atorvastatin without myalgias.  Blood pressure today 119/77. No further concerns at this time.   Update 08/05/2020 JM: [name] is a 75 y.o. 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  Intermittent lightheadedness/dizziness possibly BPPV with prior history of BPPV 2018.  Recommend continuation of aspirin and warfarin with INR goal 3.0-3.5 with INR on admission 2.9.  HTN stable.  LDL 75 and increase atorvastatin to 40 mg daily.  History of multiple prior strokes as listed below.  Evaluated by therapies and discharged to [facility] for comprehensive rehab program. Since discharge, she reports increased spasticity of LUE and weakness in LLE and decline in ambulation.  She experienced similar to the setback after stroke extension in 08/2019.  She continues to work with neuro rehab PT and started OT yesterday.  She declines right-sided deficits.  No reoccurring dizziness for BPPV type symptoms.  Currently using a cane for ambulation and denies any recent falls. INR levels have been flucuating with recent INR level 3.1.  She does monitor at home - Dr. Cathie Massey and Coumadin  clinic manages levels.  Remains on warfarin and aspirin without bleeding or bruising.  Remains on atorvastatin without myalgias.  Blood pressure today 115/69.  No concerns at this time.  Update 01/28/2020 JM: [name] is a 75 year old female who is being seen today,  01/28/2020, for stroke follow-up.  She has been stable from a stroke standpoint with stable chronic left hemiparesis and subjective lack of sensation LLE.  She continues to work with neuro rehab PT with ongoing benefit.  Continues to use a cane outdoors and use of AFO brace but ambulates without AD in her own home without falls.  Denies new or worsening stroke/TIA symptoms.  Continues on aspirin and warfarin without bleeding or bruising with recent INR level satisfactory at 3.1 with goal 3-3.5.  Continues on atorvastatin without myalgias.  Blood pressure today 118/70.  No concerns at this time.  Initial visit 10/22/2019 JM: [name] is a 75 year old female who is being seen today for hospital follow-up.  She continues to have mild left hemiparesis but has been experiencing issues with left hip pain and therapy believes possibly left hip bursitis and plans on evaluation by physical medicine rehab Dr. Letta Massey this Friday, 10/26/2019.  She continues to work with PT/OT with ongoing improvement.  Continues on aspirin and Coumadin without bleeding or bruising.  INR previously stable with levels remaining within goal range but today 2.9 with adjustment to Coumadin doses and ongoing follow-up with Coumadin clinic.  Continues on atorvastatin without myalgias.  Glucose levels stable.  Blood pressure today 102/71.  No further concerns   Stroke admission 09/03/2019: [name] is a 75 y.o. female with history of multiple strokess s/p craniotomy in the past presented on 09/03/2019 with increased hemiparesis of the left arm and leg.  Stroke work-up showed acute extension along the anterior margin of old infarct with unknown etiology possibly failure of collaterals and history of mechanical heart valve on long-term AC and INR suboptimal at 2.1.  MRI does confirm acute extension along the anterior margin of old infarction right parietal cortical and subcortical brain. Per Dr. Leonie Massey during admission, "it is possible she  had a transient hypoperfusion event as a new embolic event to the same distribution as the previous stroke would be unlikely..  The strokes are relatively small, and given the high risk of mechanical heart valve, I think it is reasonable to continue anticoagulation".  CTA head/neck mild arthrosclerosis but no evidence of stenosis.  2D echo EF of 55% with abnormal septal motion due to postop effect, LA severely dilated, RA mild dilation, small pericardial effusion and mild calcification/thickening of aortic valve.  Previously on aspirin and Coumadin and recommended continuation with INR goal 3-3.5.  HTN stable.  LDL 65 and recommended continuation of atorvastatin 20 mg daily.  Controlled DM with A1c 6.1.  Other stroke risk factors include advanced age, prior history of stroke, CAD and valve replacement.  She was discharged to [facility] for ongoing therapy needs.       ROS:   14 system review of systems performed and negative with exception of those listed in HPI  PMH:  Past Medical History:  Diagnosis Date   Abnormality of gait 09/07/2016   Allergy    Diabetes mellitus without complication ([place])    Patient denies this - notes history of glucose intolerance   GERD (gastroesophageal reflux disease)    Hemiparesis and alteration of sensations as late effects of stroke ([place]) 09/07/2016   History of pneumonia 1997   Hypertension  S/P MVR (mitral valve replacement)    Mechanical mitral valve replacement at age 44 (done in [place])  // echo 7/17: EF 55-60, normal wall motion, bileaflet mechanical mitral valve prosthesis functioning normally, mild LAE, mildly reduced RVSF, small pericardial effusion   Stroke ([place]) 1997, 2013, 2015    PSH:  Past Surgical History:  Procedure Laterality Date   ABDOMINAL HYSTERECTOMY     [place]    Social History:  Social History   Socioeconomic History   Marital status: Married     Spouse name: Not on file   Number of children: 2   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   Highest education level: Not on file  Occupational History   Occupation: Retired  Tobacco Use   Smoking status: Never   Smokeless tobacco: Never  Scientific laboratory technician Use: Never used  Substance and Sexual Activity   Alcohol use: No    Alcohol/week: 0.0 standard drinks   Drug use: No   Sexual activity: Not on file    Comment: Married  Other Topics Concern   Not on file  Social History Narrative   Lives at home w/ her husband   Right-handed   Caffeine: none   Social Determinants of Radio broadcast assistant Strain: Not on file  Food Insecurity: Not on file  Transportation Needs: Not on file  Physical Activity: Not on file  Stress: Not on file  Social Connections: Not on file  Intimate Partner Violence: Not on file    Family History:  Family History  Problem Relation Age of Onset   Cancer Mother        Bone   Heart disease Mother    Hyperlipidemia Mother    Hypertension Mother    Stroke Father    Hypertension Father    Heart attack Neg Hx     Medications:   Current Outpatient Medications on File Prior to Visit  Medication Sig Dispense Refill   acetaminophen (TYLENOL) 325 MG tablet Take 2 tablets (650 mg total) by mouth every 4 (four) hours as needed for mild pain (or temp > 37.5 C (99.5 F)).     amLODipine (NORVASC) 5 MG tablet Take 1 tablet (5 mg total) by mouth at bedtime. 30 tablet 0   aspirin EC 81 MG tablet Take 81 mg by mouth daily.      atorvastatin (LIPITOR) 40 MG tablet Take 1 tablet (40 mg total) by mouth daily. 90 tablet 3   calcium carbonate 1250 MG capsule Take 1,200 mg by mouth at bedtime.      cholecalciferol (VITAMIN D) 1000 UNITS tablet Take 2,000 Units daily by mouth.      denosumab (PROLIA) 60 MG/ML SOSY injection Inject 60 mg into the skin every 6 (six) months.     docusate sodium (COLACE) 100 MG capsule Take 100 mg at bedtime by mouth.       Menthol, Topical Analgesic, (BENGAY EX) Apply 1 application topically daily. All painful areas     omeprazole (PRILOSEC) 20 MG capsule Take 20 mg by mouth daily.     Propylene Glycol-Glycerin (SOOTHE) 0.6-0.6 % SOLN Place 1 drop into both eyes 2 (two) times daily as needed (dry eyes).     warfarin (COUMADIN) 5 MG tablet Take as directed by Coumadin Clinic 45 tablet 3   No current facility-administered medications on file  prior to visit.    Allergies:   Allergies  Allergen Reactions   Zoloft [Sertraline] Hives, Itching and Rash     Physical Exam  Vitals:   08/12/21 0835  BP: 112/68  Pulse: 76  Weight: 129 lb (58.5 kg)  Height: 5\' 7"  (1.702 m)    Body mass index is 20.2 kg/m. No results found.  General: well developed, well nourished, very pleasant elderly Caucasian female, seated, in no evident distress Head: head normocephalic and atraumatic.   Neck: supple with no carotid or supraclavicular bruits Cardiovascular: regular rate and rhythm, no murmurs; mechanical valve click Musculoskeletal: no deformity Skin:  no rash/petichiae Vascular:  Normal pulses all extremities   Neurologic Exam Mental Status: Awake and fully alert.   Fluent speech and language.  Oriented to place and time. Recent and remote memory intact. Attention span, concentration and fund of knowledge appropriate. Mood and affect appropriate.  Cranial Nerves: Pupils equal, briskly reactive to light. Extraocular movements with horizontal nystagmus with 2-3 beat left gaze and mild continuous right gaze -chronic per patient.  Visual fields full to confrontation. Hearing intact. Facial sensation intact. Face, tongue, palate moves normally and symmetrically.  Motor: Normal bulk and tone.  Chronic left hemiparesis 4/5 and left foot drop with use of AFO with increased tone LUE Sensory.: intact to touch , pinprick , position and vibratory sensation.  Coordination: Rapid alternating movements normal in all extremities  except mildly decreased left hand. Finger-to-nose and heel-to-shin abnormal left side due to weakness and spasticity Gait and Station: Arises from chair with mild difficulty. Stance is normal. Gait demonstrates  mild left-sided hemiplegic gait and use of cane Reflexes: 1+ and symmetric. Toes downgoing.       ASSESSMENT: AVAYA MCJUNKINS is a 75 y.o. year old female with history of increased left hemiparesis on 09/03/2019 with stroke work-up showing acute extension along the anterior margin of old infarct secondary to unclear etiology with possible transient hypoperfusion. Admission on 07/01/2020 for dizziness likely BPPV with incidental finding of left frontal stroke.  Vascular risk factors include multiple prior strokes, HTN, HLD, mechanical heart valve on AC with INR goal 3-3.5, advanced age and CAD.      PLAN:  Hx of multiple strokes:  Residual left hemiparesis -stable Continue aspirin 81 mg daily and warfarin daily  and atorvastatin for secondary stroke prevention - from a stroke standpoint, no indication to switch aspirin to plavix as her strokes occurred in setting of suboptimal INR levels (prolonged history of difficulty managing) - these have since been under great control.  Continue INR goal 3-3.5 for secondary stroke prevention Discussed importance of close PCP follow-up for aggressive stroke risk factor management. HTN: BP<130/90.  Stable.  Managed by PCP. HLD: LDL goal<70.  Prior LDL 81 - cards increased statin. Plans to repeat lipid panel next month.  continue atorvastatin 40 mg daily.  Managed by PCP/cards. Mechanical heart valve: Continue warfarin with INR goal 3-3.5 managed and monitored by cardiology.  Recent INR level 3.1 monitored on a Monday and over the past month have been at 3.1.   Follow-up in 1 year or call earlier if needed   CC:  Tisovec, [name], MD    I spent 29 minutes of face-to-face and non-face-to-face time with patient.  This included previsit chart  review, lab review, study review, electronic health record documentation, patient education regarding history of prior strokes, importance of secondary stroke prevention measures residual deficits, importance of managing stroke risk factors and answered all  other questions to patient satisfaction  Frann Rider, [facility]  [facility] [place] [facility] [address] [place], [place] [address]  Phone [phone] Fax [phone] Note: This document was prepared with digital dictation and possible smart phrase technology. Any transcriptional errors that result from this process are unintentional.

## 2021-08-17 ENCOUNTER — Ambulatory Visit (INDEPENDENT_AMBULATORY_CARE_PROVIDER_SITE_OTHER): Payer: Medicare Other

## 2021-08-17 DIAGNOSIS — Z5181 Encounter for therapeutic drug level monitoring: Secondary | ICD-10-CM | POA: Diagnosis not present

## 2021-08-17 DIAGNOSIS — I69359 Hemiplegia and hemiparesis following cerebral infarction affecting unspecified side: Secondary | ICD-10-CM

## 2021-08-17 DIAGNOSIS — Z952 Presence of prosthetic heart valve: Secondary | ICD-10-CM

## 2021-08-17 DIAGNOSIS — Z7901 Long term (current) use of anticoagulants: Secondary | ICD-10-CM

## 2021-08-17 LAB — POCT INR: INR: 3.9 — AB (ref 2.0–3.0)

## 2021-08-17 NOTE — Patient Instructions (Signed)
Spoke with patient and instructed her to take 1/2 tablet tonight, then continue taking Warfarin 5mg  daily except 7.5mg  on Mondays and Fridays. Be consistent with green intake. Recheck INR in 1 week. Coumadin Clinic [phone].

## 2021-08-24 ENCOUNTER — Ambulatory Visit: Payer: Medicare Other | Admitting: Adult Health

## 2021-08-24 ENCOUNTER — Ambulatory Visit (INDEPENDENT_AMBULATORY_CARE_PROVIDER_SITE_OTHER): Payer: Medicare Other

## 2021-08-24 DIAGNOSIS — Z7901 Long term (current) use of anticoagulants: Secondary | ICD-10-CM

## 2021-08-24 DIAGNOSIS — Z952 Presence of prosthetic heart valve: Secondary | ICD-10-CM | POA: Diagnosis not present

## 2021-08-24 DIAGNOSIS — I69359 Hemiplegia and hemiparesis following cerebral infarction affecting unspecified side: Secondary | ICD-10-CM

## 2021-08-24 DIAGNOSIS — Z5181 Encounter for therapeutic drug level monitoring: Secondary | ICD-10-CM

## 2021-08-24 LAB — POCT INR: INR: 3.5 — AB (ref 2.0–3.0)

## 2021-08-24 NOTE — Patient Instructions (Signed)
Spoke with patient and instructed her to continue taking Warfarin 5mg  daily except 7.5mg  on Mondays and Fridays. Be consistent with green intake. Recheck INR in 1 week. Coumadin Clinic [phone].

## 2021-08-29 DIAGNOSIS — Z23 Encounter for immunization: Secondary | ICD-10-CM | POA: Diagnosis not present

## 2021-08-31 ENCOUNTER — Ambulatory Visit (INDEPENDENT_AMBULATORY_CARE_PROVIDER_SITE_OTHER): Payer: Medicare Other | Admitting: Pharmacist

## 2021-08-31 DIAGNOSIS — Z952 Presence of prosthetic heart valve: Secondary | ICD-10-CM

## 2021-08-31 DIAGNOSIS — I69359 Hemiplegia and hemiparesis following cerebral infarction affecting unspecified side: Secondary | ICD-10-CM

## 2021-08-31 DIAGNOSIS — Z5181 Encounter for therapeutic drug level monitoring: Secondary | ICD-10-CM

## 2021-08-31 DIAGNOSIS — Z7901 Long term (current) use of anticoagulants: Secondary | ICD-10-CM | POA: Diagnosis not present

## 2021-08-31 LAB — POCT INR: INR: 3.2 — AB (ref 2.0–3.0)

## 2021-08-31 NOTE — Patient Instructions (Signed)
Description   Spoke with patient and instructed her to continue taking Warfarin 5mg  daily except 7.5mg  on Mondays and Fridays. Be consistent with green intake. Recheck INR in 1 week. Coumadin Clinic [phone].

## 2021-09-07 ENCOUNTER — Ambulatory Visit (INDEPENDENT_AMBULATORY_CARE_PROVIDER_SITE_OTHER): Payer: Medicare Other | Admitting: Cardiovascular Disease

## 2021-09-07 DIAGNOSIS — N281 Cyst of kidney, acquired: Secondary | ICD-10-CM | POA: Diagnosis not present

## 2021-09-07 DIAGNOSIS — N200 Calculus of kidney: Secondary | ICD-10-CM | POA: Diagnosis not present

## 2021-09-07 DIAGNOSIS — Z5181 Encounter for therapeutic drug level monitoring: Secondary | ICD-10-CM

## 2021-09-07 DIAGNOSIS — Z952 Presence of prosthetic heart valve: Secondary | ICD-10-CM | POA: Diagnosis not present

## 2021-09-07 DIAGNOSIS — N3941 Urge incontinence: Secondary | ICD-10-CM | POA: Diagnosis not present

## 2021-09-07 DIAGNOSIS — Z7901 Long term (current) use of anticoagulants: Secondary | ICD-10-CM | POA: Diagnosis not present

## 2021-09-07 LAB — POCT INR: INR: 4 — AB (ref 2.0–3.0)

## 2021-09-07 NOTE — Patient Instructions (Signed)
Description   Called and spoke to pt and instructed her to take 2.5mg  of warfarin today and then continue to take 5mg  daily except for 7.5mg  on Mondays and Fridays. Recheck INR in 1 week.

## 2021-09-08 ENCOUNTER — Telehealth: Payer: Self-pay | Admitting: Pharmacist

## 2021-09-08 NOTE — Telephone Encounter (Signed)
Patient called.  Reported she forgot she was supposed to reduce her warfarin dose yesterday and still took 1.5 tablets.  INR had been elevated.  Advised patient to hold todays dose and to eat an extra serving of broccoli today. Patient voiced understanding.

## 2021-09-14 ENCOUNTER — Ambulatory Visit (INDEPENDENT_AMBULATORY_CARE_PROVIDER_SITE_OTHER): Payer: Medicare Other

## 2021-09-14 DIAGNOSIS — Z5181 Encounter for therapeutic drug level monitoring: Secondary | ICD-10-CM | POA: Diagnosis not present

## 2021-09-14 DIAGNOSIS — I69359 Hemiplegia and hemiparesis following cerebral infarction affecting unspecified side: Secondary | ICD-10-CM

## 2021-09-14 DIAGNOSIS — Z7901 Long term (current) use of anticoagulants: Secondary | ICD-10-CM | POA: Diagnosis not present

## 2021-09-14 DIAGNOSIS — Z952 Presence of prosthetic heart valve: Secondary | ICD-10-CM

## 2021-09-14 LAB — POCT INR: INR: 2.9 (ref 2.0–3.0)

## 2021-09-14 NOTE — Patient Instructions (Signed)
Called and spoke to pt and instructed her to take 10 mg of warfarin today and then continue to take 5mg  daily except for 7.5mg  on Mondays and Fridays. Recheck INR in 1 week.

## 2021-09-16 ENCOUNTER — Other Ambulatory Visit: Payer: Medicare Other

## 2021-09-16 ENCOUNTER — Other Ambulatory Visit: Payer: Self-pay

## 2021-09-16 DIAGNOSIS — E785 Hyperlipidemia, unspecified: Secondary | ICD-10-CM

## 2021-09-16 DIAGNOSIS — Z952 Presence of prosthetic heart valve: Secondary | ICD-10-CM

## 2021-09-16 DIAGNOSIS — I10 Essential (primary) hypertension: Secondary | ICD-10-CM | POA: Diagnosis not present

## 2021-09-16 LAB — LIPID PANEL
Chol/HDL Ratio: 2.2 ratio (ref 0.0–4.4)
Cholesterol, Total: 145 mg/dL (ref 100–199)
HDL: 65 mg/dL (ref >39)
LDL Chol Calc (NIH): 64 mg/dL (ref 0–99)
Triglycerides: 86 mg/dL (ref 0–149)
VLDL Cholesterol Cal: 16 mg/dL (ref 5–40)

## 2021-09-16 LAB — HEPATIC FUNCTION PANEL
ALT: 33 IU/L — ABNORMAL HIGH (ref 0–32)
AST: 28 IU/L (ref 0–40)
Albumin: 4.9 g/dL — ABNORMAL HIGH (ref 3.7–4.7)
Alkaline Phosphatase: 91 IU/L (ref 44–121)
Bilirubin Total: 0.4 mg/dL (ref 0.0–1.2)
Bilirubin, Direct: 0.15 mg/dL (ref 0.00–0.40)
Total Protein: 6.7 g/dL (ref 6.0–8.5)

## 2021-09-21 ENCOUNTER — Ambulatory Visit (INDEPENDENT_AMBULATORY_CARE_PROVIDER_SITE_OTHER): Payer: Medicare Other

## 2021-09-21 DIAGNOSIS — Z5181 Encounter for therapeutic drug level monitoring: Secondary | ICD-10-CM

## 2021-09-21 DIAGNOSIS — Z7901 Long term (current) use of anticoagulants: Secondary | ICD-10-CM

## 2021-09-21 LAB — POCT INR: INR: 3.7 — AB (ref 2.0–3.0)

## 2021-09-21 NOTE — Patient Instructions (Signed)
Description   Called and spoke to pt and instructed her to take 5 mg of warfarin today and then continue to take 5mg  daily except for 7.5mg  on Mondays and Fridays. Recheck INR in 1 week.

## 2021-09-28 ENCOUNTER — Ambulatory Visit (INDEPENDENT_AMBULATORY_CARE_PROVIDER_SITE_OTHER): Payer: Medicare Other

## 2021-09-28 ENCOUNTER — Other Ambulatory Visit (HOSPITAL_COMMUNITY): Payer: Self-pay | Admitting: *Deleted

## 2021-09-28 DIAGNOSIS — I69359 Hemiplegia and hemiparesis following cerebral infarction affecting unspecified side: Secondary | ICD-10-CM | POA: Diagnosis not present

## 2021-09-28 DIAGNOSIS — Z952 Presence of prosthetic heart valve: Secondary | ICD-10-CM | POA: Diagnosis not present

## 2021-09-28 DIAGNOSIS — Z5181 Encounter for therapeutic drug level monitoring: Secondary | ICD-10-CM

## 2021-09-28 DIAGNOSIS — Z7901 Long term (current) use of anticoagulants: Secondary | ICD-10-CM | POA: Diagnosis not present

## 2021-09-28 LAB — POCT INR: INR: 2.9 (ref 2.0–3.0)

## 2021-09-28 NOTE — Patient Instructions (Addendum)
    Description   Called and spoke to pt and instructed her to take 10 mg of warfarin today and then continue to take 5mg  daily except for 7.5mg  on Mondays and Fridays. Recheck INR in 1 week.

## 2021-09-29 ENCOUNTER — Ambulatory Visit (HOSPITAL_COMMUNITY)
Admission: RE | Admit: 2021-09-29 | Discharge: 2021-09-29 | Disposition: A | Discharge status: Home / Self Care | Payer: Medicare Other | Source: Ambulatory Visit | Attending: Internal Medicine | Admitting: Internal Medicine

## 2021-09-29 ENCOUNTER — Other Ambulatory Visit: Payer: Self-pay

## 2021-09-29 ENCOUNTER — Encounter (HOSPITAL_COMMUNITY): Payer: Medicare Other

## 2021-09-29 DIAGNOSIS — M810 Age-related osteoporosis without current pathological fracture: Secondary | ICD-10-CM | POA: Diagnosis not present

## 2021-09-29 MED ORDER — DENOSUMAB 60 MG/ML ~~LOC~~ SOSY
PREFILLED_SYRINGE | SUBCUTANEOUS | Status: AC
  Administered 2021-09-29: 60 mg via SUBCUTANEOUS
  Filled 2021-09-29: qty 1

## 2021-09-29 MED ORDER — DENOSUMAB 60 MG/ML ~~LOC~~ SOSY: 60.0000 mg | PREFILLED_SYRINGE | Freq: Once | SUBCUTANEOUS | Status: AC

## 2021-10-05 ENCOUNTER — Ambulatory Visit (INDEPENDENT_AMBULATORY_CARE_PROVIDER_SITE_OTHER): Payer: Medicare Other | Admitting: Cardiovascular Disease

## 2021-10-05 DIAGNOSIS — Z5181 Encounter for therapeutic drug level monitoring: Secondary | ICD-10-CM | POA: Diagnosis not present

## 2021-10-05 DIAGNOSIS — Z7901 Long term (current) use of anticoagulants: Secondary | ICD-10-CM | POA: Diagnosis not present

## 2021-10-05 DIAGNOSIS — R310 Gross hematuria: Secondary | ICD-10-CM | POA: Diagnosis not present

## 2021-10-05 DIAGNOSIS — Z87442 Personal history of urinary calculi: Secondary | ICD-10-CM | POA: Diagnosis not present

## 2021-10-05 DIAGNOSIS — Z952 Presence of prosthetic heart valve: Secondary | ICD-10-CM | POA: Diagnosis not present

## 2021-10-05 LAB — POCT INR: INR: 3.8 — AB (ref 2.0–3.0)

## 2021-10-05 NOTE — Patient Instructions (Signed)
Description   Called and spoke to pt and instructed her to take 5 mg of warfarin today and then continue to take 5mg  daily except for 7.5mg  on Mondays and Fridays. Recheck INR in 1 week.

## 2021-10-07 ENCOUNTER — Encounter (HOSPITAL_COMMUNITY): Payer: Medicare Other

## 2021-10-07 ENCOUNTER — Telehealth: Payer: Self-pay | Admitting: *Deleted

## 2021-10-07 DIAGNOSIS — I129 Hypertensive chronic kidney disease with stage 1 through stage 4 chronic kidney disease, or unspecified chronic kidney disease: Secondary | ICD-10-CM | POA: Diagnosis not present

## 2021-10-07 DIAGNOSIS — E7800 Pure hypercholesterolemia, unspecified: Secondary | ICD-10-CM | POA: Diagnosis not present

## 2021-10-07 DIAGNOSIS — N182 Chronic kidney disease, stage 2 (mild): Secondary | ICD-10-CM | POA: Diagnosis not present

## 2021-10-07 NOTE — Telephone Encounter (Signed)
Pt called to inform us that she was prescribed an antibiotic by her Urologist. She started Cephalexin 500mg  three times a day. Advised that the med is safe to take with  Warfarin and she verbalized understanding.

## 2021-10-12 ENCOUNTER — Ambulatory Visit (INDEPENDENT_AMBULATORY_CARE_PROVIDER_SITE_OTHER): Payer: Medicare Other

## 2021-10-12 DIAGNOSIS — Z7901 Long term (current) use of anticoagulants: Secondary | ICD-10-CM

## 2021-10-12 DIAGNOSIS — Z952 Presence of prosthetic heart valve: Secondary | ICD-10-CM

## 2021-10-12 DIAGNOSIS — Z5181 Encounter for therapeutic drug level monitoring: Secondary | ICD-10-CM

## 2021-10-12 DIAGNOSIS — I69359 Hemiplegia and hemiparesis following cerebral infarction affecting unspecified side: Secondary | ICD-10-CM | POA: Diagnosis not present

## 2021-10-12 LAB — POCT INR: INR: 3.7 — AB (ref 2.0–3.0)

## 2021-10-12 NOTE — Patient Instructions (Signed)
Description   Called and spoke to pt and instructed her start taking 5mg  daily except for 7.5mg  on Fridays. Recheck INR in 1 week.

## 2021-10-14 DIAGNOSIS — R7301 Impaired fasting glucose: Secondary | ICD-10-CM | POA: Diagnosis not present

## 2021-10-14 DIAGNOSIS — G8194 Hemiplegia, unspecified affecting left nondominant side: Secondary | ICD-10-CM | POA: Diagnosis not present

## 2021-10-14 DIAGNOSIS — I69998 Other sequelae following unspecified cerebrovascular disease: Secondary | ICD-10-CM | POA: Diagnosis not present

## 2021-10-14 DIAGNOSIS — E7800 Pure hypercholesterolemia, unspecified: Secondary | ICD-10-CM | POA: Diagnosis not present

## 2021-10-14 DIAGNOSIS — N182 Chronic kidney disease, stage 2 (mild): Secondary | ICD-10-CM | POA: Diagnosis not present

## 2021-10-14 DIAGNOSIS — I129 Hypertensive chronic kidney disease with stage 1 through stage 4 chronic kidney disease, or unspecified chronic kidney disease: Secondary | ICD-10-CM | POA: Diagnosis not present

## 2021-10-14 DIAGNOSIS — I679 Cerebrovascular disease, unspecified: Secondary | ICD-10-CM | POA: Diagnosis not present

## 2021-10-14 DIAGNOSIS — D692 Other nonthrombocytopenic purpura: Secondary | ICD-10-CM | POA: Diagnosis not present

## 2021-10-14 DIAGNOSIS — R2689 Other abnormalities of gait and mobility: Secondary | ICD-10-CM | POA: Diagnosis not present

## 2021-10-14 DIAGNOSIS — Z952 Presence of prosthetic heart valve: Secondary | ICD-10-CM | POA: Diagnosis not present

## 2021-10-14 DIAGNOSIS — Z7901 Long term (current) use of anticoagulants: Secondary | ICD-10-CM | POA: Diagnosis not present

## 2021-10-19 ENCOUNTER — Ambulatory Visit (INDEPENDENT_AMBULATORY_CARE_PROVIDER_SITE_OTHER): Payer: Medicare Other | Admitting: Cardiovascular Disease

## 2021-10-19 DIAGNOSIS — N3000 Acute cystitis without hematuria: Secondary | ICD-10-CM | POA: Diagnosis not present

## 2021-10-19 DIAGNOSIS — N200 Calculus of kidney: Secondary | ICD-10-CM | POA: Diagnosis not present

## 2021-10-19 DIAGNOSIS — Z5181 Encounter for therapeutic drug level monitoring: Secondary | ICD-10-CM | POA: Diagnosis not present

## 2021-10-19 LAB — POCT INR: INR: 3.4 — AB (ref 2.0–3.0)

## 2021-10-19 NOTE — Patient Instructions (Signed)
Description   Called and spoke to pt and instructed her  continue taking warfarin 5mg  daily except for 7.5mg  on Fridays. Recheck INR in 1 week.

## 2021-10-26 ENCOUNTER — Ambulatory Visit (INDEPENDENT_AMBULATORY_CARE_PROVIDER_SITE_OTHER): Payer: Medicare Other

## 2021-10-26 DIAGNOSIS — Z5181 Encounter for therapeutic drug level monitoring: Secondary | ICD-10-CM

## 2021-10-26 DIAGNOSIS — Z952 Presence of prosthetic heart valve: Secondary | ICD-10-CM | POA: Diagnosis not present

## 2021-10-26 DIAGNOSIS — Z7901 Long term (current) use of anticoagulants: Secondary | ICD-10-CM | POA: Diagnosis not present

## 2021-10-26 DIAGNOSIS — I69359 Hemiplegia and hemiparesis following cerebral infarction affecting unspecified side: Secondary | ICD-10-CM

## 2021-10-26 DIAGNOSIS — Z1231 Encounter for screening mammogram for malignant neoplasm of breast: Secondary | ICD-10-CM | POA: Diagnosis not present

## 2021-10-26 LAB — POCT INR: INR: 3.5 — AB (ref 2.0–3.0)

## 2021-10-26 NOTE — Patient Instructions (Signed)
Description   Called and spoke to pt and instructed her  continue taking warfarin 5mg  daily except for 7.5mg  on Fridays. Recheck INR in 1 week.

## 2021-11-03 ENCOUNTER — Ambulatory Visit (INDEPENDENT_AMBULATORY_CARE_PROVIDER_SITE_OTHER): Payer: Medicare Other | Admitting: *Deleted

## 2021-11-03 DIAGNOSIS — Z952 Presence of prosthetic heart valve: Secondary | ICD-10-CM | POA: Diagnosis not present

## 2021-11-03 DIAGNOSIS — Z5181 Encounter for therapeutic drug level monitoring: Secondary | ICD-10-CM

## 2021-11-03 DIAGNOSIS — I69359 Hemiplegia and hemiparesis following cerebral infarction affecting unspecified side: Secondary | ICD-10-CM | POA: Diagnosis not present

## 2021-11-03 DIAGNOSIS — Z7901 Long term (current) use of anticoagulants: Secondary | ICD-10-CM

## 2021-11-03 LAB — POCT INR: INR: 3.6 — AB (ref 2.0–3.0)

## 2021-11-03 NOTE — Patient Instructions (Signed)
Description   Called and spoke to pt and instructed her 2.5mg  today and since you took 1.5 tablets yesterday take 1 tablet on Friday then continue taking warfarin 5mg  daily except for 7.5mg  on Fridays. Recheck INR in 1 week.

## 2021-11-06 ENCOUNTER — Other Ambulatory Visit: Payer: Self-pay | Admitting: Cardiovascular Disease

## 2021-11-10 ENCOUNTER — Ambulatory Visit (INDEPENDENT_AMBULATORY_CARE_PROVIDER_SITE_OTHER): Payer: Medicare Other

## 2021-11-10 DIAGNOSIS — Z952 Presence of prosthetic heart valve: Secondary | ICD-10-CM

## 2021-11-10 DIAGNOSIS — Z5181 Encounter for therapeutic drug level monitoring: Secondary | ICD-10-CM

## 2021-11-10 DIAGNOSIS — I69359 Hemiplegia and hemiparesis following cerebral infarction affecting unspecified side: Secondary | ICD-10-CM

## 2021-11-10 DIAGNOSIS — Z7901 Long term (current) use of anticoagulants: Secondary | ICD-10-CM

## 2021-11-10 LAB — POCT INR: INR: 4 — AB (ref 2.0–3.0)

## 2021-11-10 NOTE — Patient Instructions (Signed)
Description   Spoke to pt and instructed her to take 2.5mg  today, then start taking warfarin 5mg  daily. Recheck INR in 1 week.

## 2021-11-11 DIAGNOSIS — R928 Other abnormal and inconclusive findings on diagnostic imaging of breast: Secondary | ICD-10-CM | POA: Diagnosis not present

## 2021-11-16 ENCOUNTER — Ambulatory Visit (INDEPENDENT_AMBULATORY_CARE_PROVIDER_SITE_OTHER): Payer: Medicare Other

## 2021-11-16 DIAGNOSIS — Z5181 Encounter for therapeutic drug level monitoring: Secondary | ICD-10-CM | POA: Diagnosis not present

## 2021-11-16 DIAGNOSIS — Z952 Presence of prosthetic heart valve: Secondary | ICD-10-CM

## 2021-11-16 DIAGNOSIS — I69359 Hemiplegia and hemiparesis following cerebral infarction affecting unspecified side: Secondary | ICD-10-CM | POA: Diagnosis not present

## 2021-11-16 DIAGNOSIS — Z7901 Long term (current) use of anticoagulants: Secondary | ICD-10-CM

## 2021-11-16 LAB — POCT INR: INR: 2.2 (ref 2.0–3.0)

## 2021-11-16 NOTE — Patient Instructions (Addendum)
Description   Spoke to pt and instructed her to take 7.5mg  today and tomorrow, then start taking warfarin 5mg  daily except 7.5mg  on Mondays.  Recheck INR in 1 week.

## 2021-11-17 ENCOUNTER — Telehealth: Payer: Self-pay | Admitting: Cardiovascular Disease

## 2021-11-17 NOTE — Telephone Encounter (Signed)
New Message:    Caren Griffins from [facility] is calling with results.

## 2021-11-17 NOTE — Telephone Encounter (Signed)
INR of 2.2 is from yesterday, result has been addressed see anticoagulation note in Epic.

## 2021-11-23 ENCOUNTER — Ambulatory Visit (INDEPENDENT_AMBULATORY_CARE_PROVIDER_SITE_OTHER): Payer: Medicare Other

## 2021-11-23 DIAGNOSIS — Z5181 Encounter for therapeutic drug level monitoring: Secondary | ICD-10-CM

## 2021-11-23 LAB — POCT INR: INR: 2.5 (ref 2.0–3.0)

## 2021-11-23 NOTE — Patient Instructions (Signed)
Description   Spoke with pt. Instructed to take 2 tablets today and then continue taking warfarin 5mg  daily except 7.5mg  on Mondays.  Recheck INR in 1 week.

## 2021-12-01 ENCOUNTER — Ambulatory Visit (INDEPENDENT_AMBULATORY_CARE_PROVIDER_SITE_OTHER): Payer: Medicare Other

## 2021-12-01 DIAGNOSIS — I69359 Hemiplegia and hemiparesis following cerebral infarction affecting unspecified side: Secondary | ICD-10-CM | POA: Diagnosis not present

## 2021-12-01 DIAGNOSIS — Z5181 Encounter for therapeutic drug level monitoring: Secondary | ICD-10-CM

## 2021-12-01 DIAGNOSIS — Z952 Presence of prosthetic heart valve: Secondary | ICD-10-CM

## 2021-12-01 DIAGNOSIS — Z7901 Long term (current) use of anticoagulants: Secondary | ICD-10-CM

## 2021-12-01 LAB — POCT INR: INR: 4 — AB (ref 2.0–3.0)

## 2021-12-01 NOTE — Patient Instructions (Signed)
-   take 1/2 tablet tonight, then continue taking warfarin 5mg  daily except 7.5mg  on Mondays.  - Recheck INR in 1 week in the office.

## 2021-12-06 DIAGNOSIS — E7800 Pure hypercholesterolemia, unspecified: Secondary | ICD-10-CM | POA: Diagnosis not present

## 2021-12-06 DIAGNOSIS — I129 Hypertensive chronic kidney disease with stage 1 through stage 4 chronic kidney disease, or unspecified chronic kidney disease: Secondary | ICD-10-CM | POA: Diagnosis not present

## 2021-12-06 DIAGNOSIS — N182 Chronic kidney disease, stage 2 (mild): Secondary | ICD-10-CM | POA: Diagnosis not present

## 2021-12-07 ENCOUNTER — Other Ambulatory Visit: Payer: Self-pay

## 2021-12-07 ENCOUNTER — Ambulatory Visit (INDEPENDENT_AMBULATORY_CARE_PROVIDER_SITE_OTHER): Payer: Medicare Other

## 2021-12-07 DIAGNOSIS — Z5181 Encounter for therapeutic drug level monitoring: Secondary | ICD-10-CM | POA: Diagnosis not present

## 2021-12-07 DIAGNOSIS — Z7901 Long term (current) use of anticoagulants: Secondary | ICD-10-CM

## 2021-12-07 DIAGNOSIS — I69359 Hemiplegia and hemiparesis following cerebral infarction affecting unspecified side: Secondary | ICD-10-CM | POA: Diagnosis not present

## 2021-12-07 DIAGNOSIS — Z952 Presence of prosthetic heart valve: Secondary | ICD-10-CM | POA: Diagnosis not present

## 2021-12-07 LAB — POCT INR: INR: 3.6 — AB (ref 2.0–3.0)

## 2021-12-07 NOTE — Progress Notes (Signed)
Pt is self tester an usually calls weekly w/ INR results, but she is concerned that her machine is not working correctly, so she requested in office visit today. When turning her machine on, immediately got "error 8" reading. Advised pt that her batteries are completely dead and asked how long she has been getting this reading. She reports that her husband has been complaining about it for some time.  Replaced batteries for her in office and machine works. Her machine reads INR of 3.5, ours in clinic reads 3.6.  Spoke w/ husband out in the waiting room and updated him. Advised him that the machine comes w/ an instruction booklet w/ the meanings of the error readings.  They are appreciative of the assistance and will resume home testing.

## 2021-12-07 NOTE — Patient Instructions (Signed)
-   have a serving of greens today - continue taking warfarin 5mg  daily except 7.5mg  on Mondays.  - Recheck INR in 1 week

## 2021-12-14 ENCOUNTER — Ambulatory Visit (INDEPENDENT_AMBULATORY_CARE_PROVIDER_SITE_OTHER): Payer: Medicare Other | Admitting: *Deleted

## 2021-12-14 DIAGNOSIS — Z7901 Long term (current) use of anticoagulants: Secondary | ICD-10-CM

## 2021-12-14 DIAGNOSIS — Z952 Presence of prosthetic heart valve: Secondary | ICD-10-CM | POA: Diagnosis not present

## 2021-12-14 DIAGNOSIS — I69359 Hemiplegia and hemiparesis following cerebral infarction affecting unspecified side: Secondary | ICD-10-CM

## 2021-12-14 DIAGNOSIS — Z5181 Encounter for therapeutic drug level monitoring: Secondary | ICD-10-CM | POA: Diagnosis not present

## 2021-12-14 LAB — POCT INR: INR: 3.2 — AB (ref 2.0–3.0)

## 2021-12-14 NOTE — Patient Instructions (Signed)
Description   Spoke with pt and instructed pt to continue taking warfarin 5mg  daily except 7.5mg  on Mondays. Recheck INR in 1 week per INSURANCE. Coumadin Clinic [phone]

## 2021-12-21 ENCOUNTER — Ambulatory Visit (INDEPENDENT_AMBULATORY_CARE_PROVIDER_SITE_OTHER): Payer: Medicare Other | Admitting: Cardiovascular Disease

## 2021-12-21 DIAGNOSIS — Z5181 Encounter for therapeutic drug level monitoring: Secondary | ICD-10-CM

## 2021-12-21 LAB — POCT INR: INR: 3.3 — AB (ref 2.0–3.0)

## 2021-12-21 NOTE — Patient Instructions (Signed)
Description   Spoke with pt and instructed pt to continue taking warfarin 5mg  daily except 7.5mg  on Mondays. Recheck INR in 1 week per INSURANCE. Coumadin Clinic [phone]

## 2021-12-22 ENCOUNTER — Encounter: Payer: Self-pay | Admitting: Cardiovascular Disease

## 2021-12-22 NOTE — Progress Notes (Signed)
Cardiology Office Note   Date:  12/23/2021   ID:  [name], DOB August 22, 1946, MRN [id number]  PCP:  Haywood Pao, MD  Cardiologist:   Mertie Moores, MD   Chief Complaint  Patient presents with   Hypertension        Problem List 1. Mitral valve replacement  - at age 76.  2. Essential HTN 3. Hyperlipidemia 4. DM.  5.  Multiple strokes   [name] is a 76 y.o. female who presents for management of her mitral valve replacement . Was seen with her husband [name].  Moved from [place], [place] recently  Has had 4 strokes and a TIA.  She does her own INR monitoring  - the strokes were not associated with marked INR variations Exercises some, not as much as when she lived in [place].   Does water aerobics.  Walks on occasion .  Also does physical therapy exercises.   April 13, 2016:  Doing well.  INRs have been OK. ( 3.2)   She measures her own.  Last week the INR levels were off when she was on Abx for a UTI .   Oct. 4, 2017:  Doing well.  S/p MVR 1997.   INR levels are ok , checks it at home and in our office  2. 9 last week   February 28, 2018: [name] is seen today for follow-up of her mitral valve replacement and hypertension.  She was last seen in our office in October, 2019 by Pecolia Ades,  NP . INR has been theraputuc because of her hx of strokes.  Goal INR is 3.0-3.5   April 09, 2020:   [name] is seen today for follow-up visit regarding her mitral valve replacement.  She was last seen in the office approximately 2 years ago. She had a stroke in October, 2020.Marland Kitchen  She has had strokes in the past.  Has occasional episodes of chest tightness.  Occurs spontaneously , not exertional  Some of the episodes tend to occur with mental stress.  She does 30 min of exercise in the am.  Does not have CP in the morning .   Sept. 14, 2021: [name] is seen today for follow up of her MV replacement, HTN She has had several strokes. Has had some chest pain  Was in the  hospital Aug. 24 - Sept 8 for another stroke.  Her INR goal is 3.0 - 3.5  Levels  Were slightly low at time .   Jan. 4, 2022: [name] is seen today for follow up of her MV replacement and multiple strokes. Her goal INR is 3.0 - 3.5, she is also on ASA 81 mg  BP has been variable . Seems to be a bit high at times.  Might be eating more salt than she  Has some palpitation  We discussed possibly starting her on plavix and coumadin instead ov ASA and coumadin   She has not had any bleeding issues.   Feb. 15, 2023 [name] is seen today for follow up of her AVR. Has had multiple strokes INR goal is now 3.0-3.5 Has been stumbling ,  has an appt with PT ( [name])  No cp - perhaps if she is rushing  Does have some dyspnea with walking   Past Medical History:  Diagnosis Date   Abnormality of gait 09/07/2016   Allergy    Diabetes mellitus without complication [facility])    Patient denies this - notes history of glucose intolerance  GERD (gastroesophageal reflux disease)    Hemiparesis and alteration of sensations as late effects of stroke ([place]) 09/07/2016   History of pneumonia 1997   Hypertension    S/P MVR (mitral valve replacement)    Mechanical mitral valve replacement at age 48 (done in [place])  // echo 7/17: EF 55-60, normal wall motion, bileaflet mechanical mitral valve prosthesis functioning normally, mild LAE, mildly reduced RVSF, small pericardial effusion   Stroke ([place]) 1997, 2013, 2015    Past Surgical History:  Procedure Laterality Date   ABDOMINAL HYSTERECTOMY     [place]     Current Outpatient Medications  Medication Sig Dispense Refill   acetaminophen (TYLENOL) 325 MG tablet Take 2 tablets (650 mg total) by mouth every 4 (four) hours as needed for mild pain (or temp > 37.5 C (99.5 F)).     amLODipine (NORVASC) 5 MG tablet Take 1 tablet (5 mg total) by mouth at bedtime. 30 tablet 0    aspirin EC 81 MG tablet Take 81 mg by mouth daily.      atorvastatin (LIPITOR) 40 MG tablet Take 1 tablet (40 mg total) by mouth daily. 90 tablet 3   CALCIUM PO Take 1,200 mg by mouth daily.     cholecalciferol (VITAMIN D) 1000 UNITS tablet Take 2,000 Units daily by mouth.      denosumab (PROLIA) 60 MG/ML SOSY injection Inject 60 mg into the skin every 6 (six) months.     docusate sodium (COLACE) 100 MG capsule Take 100 mg at bedtime by mouth.      Menthol, Topical Analgesic, (BENGAY EX) Apply 1 application topically daily. All painful areas     omeprazole (PRILOSEC) 20 MG capsule Take 20 mg by mouth daily.     Propylene Glycol-Glycerin (SOOTHE) 0.6-0.6 % SOLN Place 1 drop into both eyes 2 (two) times daily as needed (dry eyes).     warfarin (COUMADIN) 5 MG tablet TAKE AS DIRECTED PER COUMADIN CLINIC 45 tablet 3   No current facility-administered medications for this visit.    Allergies:   Zoloft [sertraline]    Social History:  The patient  reports that she has never smoked. She has never been exposed to tobacco smoke. She has never used smokeless tobacco. She reports that she does not drink alcohol and does not use drugs.   Family History:  The patient's family history includes Cancer in her mother; Heart disease in her mother; Hyperlipidemia in her mother; Hypertension in her father and mother; Stroke in her father.    ROS:   Noted in current history, otherwise review of systems is negative.   Physical Exam: Blood pressure 122/68, pulse 92, height 5\' 7"  (1.702 m), weight 126 lb 9.6 oz (57.4 kg), SpO2 96 %.  GEN:  Well nourished, well developed in no acute distress HEENT: Normal NECK: No JVD; No carotid bruits LYMPHATICS: No lymphadenopathy CARDIAC: irreg. Irreg, mech. S1.  RESPIRATORY:  Clear to auscultation without rales, wheezing or rhonchi  ABDOMEN: Soft, non-tender, non-distended MUSCULOSKELETAL:  No edema; No deformity  SKIN: Warm and dry NEUROLOGIC:  Alert and oriented x  3    EKG:      Recent Labs: 09/16/2021: ALT 33    Lipid Panel    Component Value Date/Time   CHOL 145 09/16/2021 0915   TRIG 86 09/16/2021 0915   HDL 65 09/16/2021 0915   CHOLHDL  2.2 09/16/2021 0915   CHOLHDL 2.6 07/02/2020 0500   VLDL 16 07/02/2020 0500   LDLCALC 64 09/16/2021 0915      Wt Readings from Last 3 Encounters:  12/23/21 126 lb 9.6 oz (57.4 kg)  08/12/21 129 lb (58.5 kg)  06/09/21 127 lb 9.6 oz (57.9 kg)      Other studies Reviewed: Additional studies/ records that were reviewed today include:  Review of the above records demonstrates:    ASSESSMENT AND PLAN:  1.   Mitral valve replacement:;  1997     Doing well ,  valve sounds crisp.   INR goal is 3.0-3.5.    2. Hypertension:      BP is well controlled.   3. Frequent falls:   she is meeting with the PT doctors [name]) soon    4.  Chest pain:     Current medicines are reviewed at length with the patient today.  The patient does not have concerns regarding medicines.  The following changes have been made:  no change  Labs/ tests ordered today include:   No orders of the defined types were placed in this encounter.    Disposition:         Mertie Moores, MD  12/23/2021 1:25 PM    [facility] [address], [place], [place]  [address] Phone: [phone]; Fax: [phone]

## 2021-12-23 ENCOUNTER — Other Ambulatory Visit: Payer: Self-pay | Admitting: *Deleted

## 2021-12-23 ENCOUNTER — Other Ambulatory Visit: Payer: Self-pay

## 2021-12-23 ENCOUNTER — Encounter: Payer: Self-pay | Admitting: Cardiovascular Disease

## 2021-12-23 ENCOUNTER — Ambulatory Visit (INDEPENDENT_AMBULATORY_CARE_PROVIDER_SITE_OTHER): Payer: Medicare Other | Admitting: Cardiovascular Disease

## 2021-12-23 VITALS — BP 122/68 | HR 92 | Ht 67.0 in | Wt 126.6 lb

## 2021-12-23 DIAGNOSIS — I10 Essential (primary) hypertension: Secondary | ICD-10-CM | POA: Diagnosis not present

## 2021-12-23 DIAGNOSIS — I6300 Cerebral infarction due to thrombosis of unspecified precerebral artery: Secondary | ICD-10-CM | POA: Diagnosis not present

## 2021-12-23 DIAGNOSIS — Z5181 Encounter for therapeutic drug level monitoring: Secondary | ICD-10-CM

## 2021-12-23 MED ORDER — WARFARIN SODIUM 5 MG PO TABS: ORAL_TABLET | ORAL | 3 refills | Status: DC

## 2021-12-23 NOTE — Telephone Encounter (Signed)
Pt seeing Dr. Acie Fredrickson today; refill sent as requested.

## 2021-12-23 NOTE — Patient Instructions (Signed)
Medication Instructions:  The current medical regimen is effective;  continue present plan and medications.  *If you need a refill on your cardiac medications before your next appointment, please call your pharmacy*  Follow-Up: At [facility], you and your health needs are our priority.  As part of our continuing mission to provide you with exceptional heart care, we have created designated Provider Care Teams.  These Care Teams include your primary Cardiologist (physician) and Advanced Practice Providers (APPs -  Physician Assistants and Nurse Practitioners) who all work together to provide you with the care you need, when you need it.  We recommend signing up for the patient portal called "MyChart".  Sign up information is provided on this After Visit Summary.  MyChart is used to connect with patients for Virtual Visits (Telemedicine).  Patients are able to view lab/test results, encounter notes, upcoming appointments, etc.  Non-urgent messages can be sent to your provider as well.   To learn more about what you can do with MyChart, go to [email or web address].    Your next appointment:   1 year(s)  The format for your next appointment:   In Person  Provider:   Christen Bame, NP or Richardson Dopp, PA-C     {  Thank you for choosing [facility]!!

## 2021-12-27 ENCOUNTER — Emergency Department (HOSPITAL_COMMUNITY)
Admission: EM | Admit: 2021-12-27 | Discharge: 2021-12-28 | Disposition: A | Discharge status: Home / Self Care | Payer: Medicare Other | Attending: Emergency Medicine | Admitting: Emergency Medicine

## 2021-12-27 ENCOUNTER — Other Ambulatory Visit: Payer: Self-pay

## 2021-12-27 DIAGNOSIS — R42 Dizziness and giddiness: Secondary | ICD-10-CM

## 2021-12-27 DIAGNOSIS — Z7901 Long term (current) use of anticoagulants: Secondary | ICD-10-CM | POA: Diagnosis not present

## 2021-12-27 DIAGNOSIS — D688 Other specified coagulation defects: Secondary | ICD-10-CM | POA: Diagnosis not present

## 2021-12-27 DIAGNOSIS — R29818 Other symptoms and signs involving the nervous system: Secondary | ICD-10-CM | POA: Diagnosis not present

## 2021-12-27 DIAGNOSIS — G319 Degenerative disease of nervous system, unspecified: Secondary | ICD-10-CM | POA: Diagnosis not present

## 2021-12-27 DIAGNOSIS — Z7982 Long term (current) use of aspirin: Secondary | ICD-10-CM | POA: Insufficient documentation

## 2021-12-27 DIAGNOSIS — I10 Essential (primary) hypertension: Secondary | ICD-10-CM | POA: Diagnosis not present

## 2021-12-27 DIAGNOSIS — R001 Bradycardia, unspecified: Secondary | ICD-10-CM | POA: Diagnosis not present

## 2021-12-27 DIAGNOSIS — R531 Weakness: Secondary | ICD-10-CM | POA: Insufficient documentation

## 2021-12-27 DIAGNOSIS — I6529 Occlusion and stenosis of unspecified carotid artery: Secondary | ICD-10-CM | POA: Diagnosis not present

## 2021-12-27 DIAGNOSIS — G9389 Other specified disorders of brain: Secondary | ICD-10-CM | POA: Diagnosis not present

## 2021-12-27 LAB — BASIC METABOLIC PANEL
Anion gap: 6 (ref 5–15)
BUN: 32 mg/dL — ABNORMAL HIGH (ref 8–23)
CO2: 30 mmol/L (ref 22–32)
Calcium: 9.7 mg/dL (ref 8.9–10.3)
Chloride: 105 mmol/L (ref 98–111)
Creatinine, Ser: 0.8 mg/dL (ref 0.44–1.00)
GFR, Estimated: >60 mL/min (ref >60)
Glucose, Bld: 132 mg/dL — ABNORMAL HIGH (ref 70–99)
Potassium: 3.7 mmol/L (ref 3.5–5.1)
Sodium: 141 mmol/L (ref 135–145)

## 2021-12-27 LAB — CBC
HCT: 42.2 % (ref 36.0–46.0)
Hemoglobin: 13.6 g/dL (ref 12.0–15.0)
MCH: 28.6 pg (ref 26.0–34.0)
MCHC: 32.2 g/dL (ref 30.0–36.0)
MCV: 88.8 fL (ref 80.0–100.0)
Platelets: 157 10*3/uL (ref 150–400)
RBC: 4.75 MIL/uL (ref 3.87–5.11)
RDW: 13.2 % (ref 11.5–15.5)
WBC: 4.5 10*3/uL (ref 4.0–10.5)
nRBC: 0 % (ref 0.0–0.2)

## 2021-12-27 NOTE — ED Triage Notes (Signed)
Pt c/o dizziness and balance issues for the past couple of days. Pt denies any pain.

## 2021-12-28 ENCOUNTER — Emergency Department (HOSPITAL_COMMUNITY): Payer: Medicare Other

## 2021-12-28 ENCOUNTER — Ambulatory Visit (INDEPENDENT_AMBULATORY_CARE_PROVIDER_SITE_OTHER): Payer: Medicare Other | Admitting: *Deleted

## 2021-12-28 DIAGNOSIS — Z7901 Long term (current) use of anticoagulants: Secondary | ICD-10-CM

## 2021-12-28 DIAGNOSIS — R29818 Other symptoms and signs involving the nervous system: Secondary | ICD-10-CM | POA: Diagnosis not present

## 2021-12-28 DIAGNOSIS — I69359 Hemiplegia and hemiparesis following cerebral infarction affecting unspecified side: Secondary | ICD-10-CM

## 2021-12-28 DIAGNOSIS — I6529 Occlusion and stenosis of unspecified carotid artery: Secondary | ICD-10-CM | POA: Diagnosis not present

## 2021-12-28 DIAGNOSIS — Z5181 Encounter for therapeutic drug level monitoring: Secondary | ICD-10-CM | POA: Diagnosis not present

## 2021-12-28 DIAGNOSIS — Z952 Presence of prosthetic heart valve: Secondary | ICD-10-CM

## 2021-12-28 DIAGNOSIS — G9389 Other specified disorders of brain: Secondary | ICD-10-CM | POA: Diagnosis not present

## 2021-12-28 DIAGNOSIS — G319 Degenerative disease of nervous system, unspecified: Secondary | ICD-10-CM | POA: Diagnosis not present

## 2021-12-28 DIAGNOSIS — R42 Dizziness and giddiness: Secondary | ICD-10-CM | POA: Diagnosis not present

## 2021-12-28 LAB — URINALYSIS, ROUTINE W REFLEX MICROSCOPIC
Bilirubin Urine: NEGATIVE
Glucose, UA: NEGATIVE mg/dL
Ketones, ur: NEGATIVE mg/dL
Leukocytes,Ua: NEGATIVE
Nitrite: NEGATIVE
Protein, ur: NEGATIVE mg/dL
Specific Gravity, Urine: 1.015 (ref 1.005–1.030)
pH: 6.5 (ref 5.0–8.0)

## 2021-12-28 LAB — PROTIME-INR
INR: 2.3 — ABNORMAL HIGH (ref 0.8–1.2)
Prothrombin Time: 25.7 seconds — ABNORMAL HIGH (ref 11.4–15.2)

## 2021-12-28 LAB — POCT INR: INR: 2.4 (ref 2.0–3.0)

## 2021-12-28 LAB — URINALYSIS, MICROSCOPIC (REFLEX)

## 2021-12-28 NOTE — Discharge Instructions (Signed)
Your INR today was 2.3.  Please reach out to your doctor regarding warfarin adjustments.  You had an MRI performed today that showed signs of your prior strokes.  There are no findings of new strokes.  Please follow-up with your neurologist for recheck.

## 2021-12-28 NOTE — ED Notes (Signed)
Report handed off to Linda RN.  ?

## 2021-12-28 NOTE — ED Provider Notes (Signed)
[facility] EMERGENCY DEPARTMENT Provider Note   CSN: [id number] Arrival date & time: 12/27/21  2235     History  Chief Complaint  Patient presents with   Dizziness    [name] is a 76 y.o. female.  The history is provided by the patient and medical records.  Dizziness [name] is a 76 y.o. female who presents to the Emergency Department complaining of dizziness.  She presents to the emergency department accompanied by her husband for evaluation of recurrent dizziness and balance issues.  She has a history of multiple prior CVAs, mitral valve replacement on chronic anticoagulation with warfarin.  She states that she was last well about 4 to 5 days ago.  Since then she has been experiencing dizziness that is waxing and waning, more persistent today.  It is described as a sensation of feeling unsteady.  She is having difficulty walking and needs to use a cane to ambulate now and does not typically use this at home.  At baseline she does have some left lower extremity weakness as well as ataxia with the left upper extremity.  No recent medication changes or illnesses.  No fever, chest pain, difficulty breathing, nausea, vomiting, dysuria.     Home Medications Prior to Admission medications   Medication Sig Start Date End Date Taking? Authorizing Provider  acetaminophen (TYLENOL) 325 MG tablet Take 2 tablets (650 mg total) by mouth every 4 (four) hours as needed for mild pain (or temp > 37.5 C (99.5 F)). 09/14/19  Yes Angiulli, Lavon Paganini, PA-C  amLODipine (NORVASC) 5 MG tablet Take 1 tablet (5 mg total) by mouth at bedtime. 07/15/20  Yes Angiulli, Lavon Paganini, PA-C  aspirin EC 81 MG tablet Take 81 mg by mouth daily.    Yes [provider]  atorvastatin (LIPITOR) 40 MG tablet Take 1 tablet (40 mg total) by mouth daily. Patient taking differently: Take 40 mg by mouth at bedtime. 06/09/21 06/09/22 Yes Weaver, Scott T, PA-C  CALCIUM PO Take 1,200 mg by mouth daily.    Yes [provider]  Cholecalciferol (VITAMIN D3 PO) Take 1 tablet by mouth daily.   Yes [provider]  denosumab (PROLIA) 60 MG/ML SOSY injection Inject 60 mg into the skin every 6 (six) months.   Yes [provider]  docusate sodium (COLACE) 100 MG capsule Take 100 mg at bedtime by mouth.    Yes [provider]  Menthol, Topical Analgesic, (BENGAY EX) Apply 1 application topically daily. All painful areas   Yes [provider]  Mouthwashes (BIOTENE DRY MOUTH MT) Use as directed 1 spray in the mouth or throat as needed (dry mouth).   Yes [provider]  omeprazole (PRILOSEC) 20 MG capsule Take 20 mg by mouth daily.   Yes [provider]  Propylene Glycol-Glycerin (SOOTHE) 0.6-0.6 % SOLN Place 1 drop into both eyes 2 (two) times daily as needed (dry eyes).   Yes [provider]  warfarin (COUMADIN) 5 MG tablet TAKE AS DIRECTED PER COUMADIN CLINIC Patient taking differently: Take 5-7.5 mg by mouth See admin instructions. 7.5 mg on Monday 5 mg Tuesday,Wednesday,Thursday,Friday,Saturday and sunday 12/23/21  Yes Nahser, Wonda Cheng, MD      Allergies    Zoloft [sertraline]    Review of Systems   Review of Systems  Neurological:  Positive for dizziness.  All other systems reviewed and are negative.  Physical Exam Updated Vital Signs BP (!) 155/91 (BP Location: Right Arm)  Pulse 76    Temp 98.5 F (36.9 C) (Oral)    Resp 16    Ht 5\' 7"  (1.702 m)    Wt 59 kg    SpO2 96%    BMI 20.36 kg/m  Physical Exam Vitals and nursing note reviewed.  Constitutional:      Appearance: She is well-developed.  HENT:     Head: Normocephalic and atraumatic.  Cardiovascular:     Rate and Rhythm: Normal rate and regular rhythm.     Heart sounds: No murmur heard. Pulmonary:     Effort: Pulmonary effort is normal. No respiratory distress.     Breath sounds: Normal breath sounds.  Abdominal:     Palpations: Abdomen is soft.      Tenderness: There is no abdominal tenderness. There is no guarding or rebound.  Musculoskeletal:        General: No tenderness.  Skin:    General: Skin is warm and dry.  Neurological:     Mental Status: She is alert and oriented to person, place, and time.     Comments: No asymmetry of facial movements.  Visual fields grossly intact.  No pronator drift.  5/5 strength in BUE.  4/5 strength in LLE.  Mild ataxia on FTN in LUE.  Intention tremor in RLE.    Psychiatric:        Behavior: Behavior normal.    ED Results / Procedures / Treatments   Labs (all labs ordered are listed, but only abnormal results are displayed) Labs Reviewed  BASIC METABOLIC PANEL - Abnormal; Notable for the following components:      Result Value   Glucose, Bld 132 (*)    BUN 32 (*)    All other components within normal limits  URINALYSIS, ROUTINE W REFLEX MICROSCOPIC - Abnormal; Notable for the following components:   Hgb urine dipstick SMALL (*)    All other components within normal limits  PROTIME-INR - Abnormal; Notable for the following components:   Prothrombin Time 25.7 (*)    INR 2.3 (*)    All other components within normal limits  URINALYSIS, MICROSCOPIC (REFLEX) - Abnormal; Notable for the following components:   Bacteria, UA RARE (*)    All other components within normal limits  CBC    EKG EKG Interpretation  Date/Time:  Sunday December 27 2021 22:44:33 EST Ventricular Rate:  87 PR Interval:  258 QRS Duration: 76 QT Interval:  374 QTC Calculation: 450 R Axis:   -58 Text Interpretation: Sinus rhythm with 1st degree A-V block Low voltage QRS Left anterior fascicular block Cannot rule out Inferior infarct (masked by fascicular block?) , age undetermined Possible Anterolateral infarct , age undetermined Abnormal ECG Artifact Confirmed by Quintella Reichert [phone]) on 12/27/2021 11:49:34 PM  Radiology CT Head Wo Contrast  Result Date: 12/28/2021 CLINICAL DATA:  Neuro deficit, acute, stroke  suspected. EXAM: CT HEAD WITHOUT CONTRAST TECHNIQUE: Contiguous axial images were obtained from the base of the skull through the vertex without intravenous contrast. RADIATION DOSE REDUCTION: This exam was performed according to the departmental dose-optimization program which includes automated exposure control, adjustment of the mA and/or kV according to patient size and/or use of iterative reconstruction technique. COMPARISON:  07/02/2020. FINDINGS: Brain: No acute intracranial hemorrhage, midline shift or mass effect. No extra-axial fluid collection. There is stable encephalomalacia with a high density nodule in the parietal lobe on the right. Periventricular white matter hypodensities are seen bilaterally. No hydrocephalus. Old cerebellar infarcts are noted bilaterally. Vascular: Atherosclerotic  calcification of the carotid siphons. No hyperdense vessel. Skull: Craniotomy changes are present in the frontoparietal region on the right. No acute fracture. Sinuses/Orbits: No acute finding. Other: None. IMPRESSION: Stable head CT with no acute intracranial process. Electronically Signed   By: Brett Fairy M.D.   On: 12/28/2021 01:02   MR Brain Wo Contrast (neuro protocol)  Result Date: 12/28/2021 CLINICAL DATA:  Initial evaluation for neuro deficit, stroke suspected. EXAM: MRI HEAD WITHOUT CONTRAST TECHNIQUE: Multiplanar, multiecho pulse sequences of the brain and surrounding structures were obtained without intravenous contrast. COMPARISON:  Prior CT from earlier the same day. FINDINGS: Brain: Moderately advanced cerebral and cerebellar atrophy. Patchy and confluent T2/FLAIR hyperintensity involving the periventricular and deep white matter both cerebral hemispheres most consistent with chronic small vessel ischemic disease, moderate in nature. Multiple scattered remote lacunar infarcts noted about the deep gray nuclei. Multiple chronic bilateral cerebellar infarcts noted. Chronic encephalomalacia and gliosis  involving the cortical and subcortical right parietal lobe with associated chronic hemosiderin staining, stable. Changes of overlying right craniotomy. No abnormal foci of restricted diffusion to suggest acute or subacute ischemia. Gray-white matter differentiation otherwise maintained. No other areas of chronic cortical infarction. No acute intracranial hemorrhage. Few punctate chronic micro hemorrhages noted about the cerebellar vermis, likely hypertensive in nature. No mass lesion, midline shift or mass effect. No hydrocephalus or extra-axial fluid collection. Pituitary gland suprasellar region normal. Vascular: Major intracranial vascular flow voids are maintained. Skull and upper cervical spine: Craniocervical junction normal. Bone marrow signal intensity normal. Previous right parietal craniotomy. No acute scalp soft tissue abnormality. Sinuses/Orbits: Globes and orbital soft tissues within normal limits. Paranasal sinuses are clear. No significant mastoid effusion. Other: None. IMPRESSION: 1. No acute intracranial abnormality. 2. Moderately advanced cerebral and cerebellar atrophy with chronic microvascular ischemic disease, with multiple remote infarcts about the deep gray nuclei and cerebellum. 3. Previous right parietal craniotomy with underlying chronic encephalomalacia, stable. Electronically Signed   By: Jeannine Boga M.D.   On: 12/28/2021 03:00    Procedures Procedures    Medications Ordered in ED Medications - No data to display  ED Course/ Medical Decision Making/ A&P                           Medical Decision Making Amount and/or Complexity of Data Reviewed Labs: ordered. Radiology: ordered.   Pt with hx/o mitral valve replacement, cva on chronic anticoagulation here for evaluation of dizziness.  She does have some left sided weakness/ataxia, which patient states is at her baseline.  CT head without acute abnormality.  MRI neg for acute infarct.  INR is subtherapeutic at  2.3.  UA not c/w UTI.  D/w pt findings of studies.  Feel she is stable to d/c home with neurology, PT and PCP follow up.    No evidence of acute metabolic process, infection, cva.          Final Clinical Impression(s) / ED Diagnoses Final diagnoses:  Dizziness    Rx / DC Orders ED Discharge Orders     None         Quintella Reichert, MD 12/28/21 0401

## 2021-12-28 NOTE — ED Notes (Signed)
Received verbal report from Amy K RN at this time 

## 2021-12-28 NOTE — ED Notes (Signed)
Patient transported to CT 

## 2021-12-28 NOTE — ED Notes (Signed)
Provider at bedside at this time

## 2021-12-28 NOTE — ED Notes (Signed)
C/o dizziness with motion and it makes her unsteady on her feet.

## 2021-12-29 ENCOUNTER — Encounter: Payer: Self-pay | Admitting: Physical Medicine & Rehabilitation

## 2021-12-29 ENCOUNTER — Other Ambulatory Visit: Payer: Self-pay

## 2021-12-29 ENCOUNTER — Encounter: Payer: Medicare Other | Attending: Physical Medicine & Rehabilitation | Admitting: Physical Medicine & Rehabilitation

## 2021-12-29 VITALS — BP 121/78 | HR 84 | Ht 67.0 in | Wt 126.6 lb

## 2021-12-29 DIAGNOSIS — G8194 Hemiplegia, unspecified affecting left nondominant side: Secondary | ICD-10-CM | POA: Insufficient documentation

## 2021-12-29 DIAGNOSIS — I6300 Cerebral infarction due to thrombosis of unspecified precerebral artery: Secondary | ICD-10-CM

## 2021-12-29 DIAGNOSIS — M4107 Infantile idiopathic scoliosis, lumbosacral region: Secondary | ICD-10-CM | POA: Diagnosis not present

## 2021-12-29 NOTE — Progress Notes (Addendum)
Subjective:    Patient ID: [name], female    DOB: 11/09/1945, 76 y.o.   MRN: [id number] 76 y.o. 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  Per chart review lives with spouse.  Used a cane prior to admission and a left AFO.  She has a son in [place].  Presented 07/01/2020 with dizziness and weakness x3 days.  MRI showed single small cortical subcortical ischemic nonhemorrhagic infarct involving the parasagittal posterior left frontal lobe.  Underlying moderate to advanced age-related cerebral atrophy with chronic microvascular ischemic disease with multiple remote lacunar infarcts involving the left thalamus and bilateral cerebellar hemispheres.  CT angiogram of head and neck with no emergent findings.  Patient did not receive TPA.  Admission chemistries INR 2.9 hemoglobin A1c 6.1 chemistries unremarkable.  Echocardiogram with ejection fraction of 60% no wall motion abnormalities.  Chronic Coumadin ongoing with a goal INR of 3.0-3.5.  Tolerating regular diet.  Patient was admitted for a comprehensive rehab program. Admit date: 07/04/2020 Discharge date: 07/16/2020 HPI Patient feels gradual worsening of balance as well as difficulty getting up from chair or bed.  She also complains of low back pain mostly left-sided. She has a history of levoscoliosis in the lumbar area she has chronic left hemiparesis.  She ambulates with a cane.  She is otherwise independent.  She does wear a left AFO.  Her last therapy was around 15 months ago. Pain Inventory Average Pain 4 Pain Right Now 1 My pain is intermittent and aching  In the last 24 hours, has pain interfered with the following? General activity 1 Relation with others 3 Enjoyment of life 4 What TIME of day is your pain at its worst? morning   Sleep (in general) Good  Pain is worse with: walking, bending, standing, and some activites Pain improves with: medication Relief from Meds: 8  Family History  Problem Relation Age of Onset   Cancer Mother        Bone   Heart disease Mother    Hyperlipidemia Mother    Hypertension Mother    Stroke Father    Hypertension Father    Heart attack Neg Hx    Social History   Socioeconomic History   Marital status: Married    Spouse name: Not on file   Number of children: 2   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   Highest education level: Not on file  Occupational History   Occupation: Retired  Tobacco Use   Smoking status: Never    Passive exposure: Never   Smokeless tobacco: Never  Vaping Use   Vaping Use: Never used  Substance and Sexual Activity   Alcohol use: No    Alcohol/week: 0.0 standard drinks   Drug use: No   Sexual activity: Not on file    Comment: Married  Other Topics Concern   Not on file  Social History Narrative   Lives at home w/ her husband   Right-handed   Caffeine: none   Social Determinants of Radio broadcast assistant Strain: Not on file  Food Insecurity: Not on file  Transportation Needs: Not on file  Physical Activity: Not on file  Stress: Not on file  Social Connections: Not on file   Past Surgical History:  Procedure Laterality Date   ABDOMINAL HYSTERECTOMY     BRAIN SURGERY  [place]   Past Surgical History:  Procedure Laterality Date   ABDOMINAL HYSTERECTOMY     BRAIN SURGERY     [place]   Past Medical History:  Diagnosis Date   Abnormality of gait 09/07/2016   Allergy    Diabetes mellitus without complication ([place])    Patient denies this - notes history of glucose intolerance   GERD (gastroesophageal reflux disease)    Hemiparesis and alteration of sensations as late effects of  stroke ([place]) 09/07/2016   History of pneumonia 1997   Hypertension    S/P MVR (mitral valve replacement)    Mechanical mitral valve replacement at age 76 (done in [place])  // echo 7/17: EF 55-60, normal wall motion, bileaflet mechanical mitral valve prosthesis functioning normally, mild LAE, mildly reduced RVSF, small pericardial effusion   Stroke ([place]) 1997, 2013, 2015   BP 121/78    Pulse 84    Ht 5\' 7"  (1.702 m)    Wt 126 lb 9.6 oz (57.4 kg)    SpO2 94%    BMI 19.83 kg/m   Opioid Risk Score:   Fall Risk Score:  `1  Depression screen PHQ 2/9  Depression screen [facility] 2/9 12/29/2021 06/09/2021 09/12/2020 07/29/2020 07/18/2020 10/22/2019 09/25/2019  Decreased Interest 1 0 0 0 0 0 0  Down, Depressed, Hopeless 1 0 0 0 0 0 0  PHQ - 2 Score 2 0 0 0 0 0 0  Altered sleeping - - - 1 - - -  Tired, decreased energy - - - 0 - - -  Change in appetite - - - 0 - - -  Feeling bad or failure about yourself  - - - 0 - - -  Trouble concentrating - - - 0 - - -  Moving slowly or fidgety/restless - - - 0 - - -  Suicidal thoughts - - - 0 - - -  PHQ-9 Score - - - 1 - - -  Some recent data might be hidden     Review of Systems  Musculoskeletal:  Positive for back pain.       Left upper side of leg pain  All other systems reviewed and are negative.     Objective:   Physical Exam Vitals and nursing note reviewed.  Constitutional:      Appearance: She is normal weight.  HENT:     Head: Normocephalic and atraumatic.  Eyes:     Extraocular Movements: Extraocular movements intact.     Conjunctiva/sclera: Conjunctivae normal.     Pupils: Pupils are equal, round, and reactive to light.  Musculoskeletal:     Right lower leg: No edema.     Left lower leg: No edema.  Skin:    General: Skin is warm and dry.  Neurological:     General: No focal deficit present.     Mental Status: She is alert and oriented to person, place, and time.  Psychiatric:        Mood and Affect: Mood normal.        Behavior:  Behavior normal.   Motor strength is 4/5 in the left upper deltoid bicep tricep grip 4/5 left hip flexor knee extensor 3 - at the dorsiflexor Right upper and right lower extremity 5/5 Ambulates with a stifflegged gait left AFO cane  Mild tenderness palpation left  lumbar paraspinals she has levoconvex scoliosis in the lumbar spine.  She has decreased lumbar extension 50% lumbar flexion. Negative straight leg raising my bilaterally     Assessment & Plan:   1.  Chronic left hemiparesis with gradual decline in function but no falls.  The patient is becoming somewhat deconditioned, does not do regular exercises.  Will need another round of physical therapy as an outpatient 2.  Levoscoliosis with chronic lumbar pain.  We discussed that the scoliosis is fixed and cannot be reversed and is likely the main factor with her back pain.  Also because of her left hemiparesis and use of AFO her gait is altered and this can reduce some additional physical stress on lumbar structures.  Therapy and stretching may be helpful for this Physical medicine rehab follow-up in 3 months

## 2022-01-04 ENCOUNTER — Ambulatory Visit (INDEPENDENT_AMBULATORY_CARE_PROVIDER_SITE_OTHER): Payer: Medicare Other

## 2022-01-04 ENCOUNTER — Other Ambulatory Visit: Payer: Self-pay

## 2022-01-04 ENCOUNTER — Ambulatory Visit: Payer: Medicare Other | Attending: Physical Medicine & Rehabilitation | Admitting: Physical Therapy

## 2022-01-04 ENCOUNTER — Encounter: Payer: Self-pay | Admitting: Physical Therapy

## 2022-01-04 DIAGNOSIS — G8194 Hemiplegia, unspecified affecting left nondominant side: Secondary | ICD-10-CM | POA: Insufficient documentation

## 2022-01-04 DIAGNOSIS — R278 Other lack of coordination: Secondary | ICD-10-CM | POA: Diagnosis not present

## 2022-01-04 DIAGNOSIS — R2681 Unsteadiness on feet: Secondary | ICD-10-CM | POA: Insufficient documentation

## 2022-01-04 DIAGNOSIS — R29898 Other symptoms and signs involving the musculoskeletal system: Secondary | ICD-10-CM | POA: Diagnosis not present

## 2022-01-04 DIAGNOSIS — I69354 Hemiplegia and hemiparesis following cerebral infarction affecting left non-dominant side: Secondary | ICD-10-CM | POA: Diagnosis not present

## 2022-01-04 DIAGNOSIS — R29818 Other symptoms and signs involving the nervous system: Secondary | ICD-10-CM | POA: Insufficient documentation

## 2022-01-04 DIAGNOSIS — R2689 Other abnormalities of gait and mobility: Secondary | ICD-10-CM | POA: Diagnosis not present

## 2022-01-04 DIAGNOSIS — M6281 Muscle weakness (generalized): Secondary | ICD-10-CM | POA: Insufficient documentation

## 2022-01-04 DIAGNOSIS — Z7901 Long term (current) use of anticoagulants: Secondary | ICD-10-CM | POA: Diagnosis not present

## 2022-01-04 DIAGNOSIS — R270 Ataxia, unspecified: Secondary | ICD-10-CM | POA: Diagnosis not present

## 2022-01-04 DIAGNOSIS — I6300 Cerebral infarction due to thrombosis of unspecified precerebral artery: Secondary | ICD-10-CM

## 2022-01-04 LAB — POCT INR: INR: 3.5 — AB (ref 2.0–3.0)

## 2022-01-04 NOTE — Patient Instructions (Signed)
Description   Spoke with pt and instructed to continue taking warfarin 5mg  daily except 7.5mg  on Mondays. Recheck INR in 1 week per INSURANCE. [facility] [phone]

## 2022-01-04 NOTE — Therapy (Signed)
[facility]. [place], [place], [address] Phone: [phone]   Fax:  [phone]  Physical Therapy Evaluation  Patient Details  Name: [name] MRN: [id number] Date of Birth: [birth year] Referring Provider (PT): Dr Letta Pate   Encounter Date: 01/04/2022   PT End of Session - 01/04/22 1845     Visit Number 1    Number of Visits 16    Date for PT Re-Evaluation 03/01/22    PT Start Time 1802    PT Stop Time 1838    PT Time Calculation (min) 36 min    Activity Tolerance Patient tolerated treatment well    Behavior During Therapy [facility] for tasks assessed/performed             Past Medical History:  Diagnosis Date   Abnormality of gait 09/07/2016   Allergy    Diabetes mellitus without complication [facility])    Patient denies this - notes history of glucose intolerance   GERD (gastroesophageal reflux disease)    Hemiparesis and alteration of sensations as late effects of stroke ([place]) 09/07/2016   History of pneumonia 1997   Hypertension    S/P MVR (mitral valve replacement)    Mechanical mitral valve replacement at age 41 (done in [place])  // echo 7/17: EF 55-60, normal wall motion, bileaflet mechanical mitral valve prosthesis functioning normally, mild LAE, mildly reduced RVSF, small pericardial effusion   Stroke ([place]) 1997, 2013, 2015    Past Surgical History:  Procedure Laterality Date   [place]    There were no vitals filed for this visit.    Subjective Assessment - 01/04/22 1802     Subjective Patient arrives reporting increased clumsiness and imbalance in the past couple months. She also notes increased difficulty getting out of a chair. Today when she was holding a trash bag and started to put it down when she felt a "punch" in her L buttock. It caused balance loss, she caught herself on  a door. She reports LBP, has chronic scoliosis in her low back.    Patient Stated Goals She would like a tune up, strenghening, balance, coordination, pain contorl.    Currently in Pain? Yes    Pain Score 2     Pain Location Hip    Pain Orientation Left    Pain Descriptors / Indicators Other (Comment);Pounding   punching   Pain Type Acute pain    Pain Radiating Towards stays inhip    Pain Onset Today    Pain Frequency Intermittent                [facility] PT Assessment - 01/04/22 0001       Assessment   Medical Diagnosis weakness, decreaesd blaance    Referring Provider (PT) Dr Letta Pate      Balance Screen   Has the patient fallen in the past 6 months Yes    How many times? 1      [place] Spouse/significant other    Available Help at Discharge Family    Home Access Level entry    [place] Two level;Able to live on main level with bedroom/bathroom    Additional Comments townhome with 12 steps but has everything available on first floor, difficulty with stairs  currently. Feels her grip strength is declining.      Prior Function   Level of Independence Independent    Leisure [place], indoors, feels unsteady outdoors      Cognition   Overall Cognitive Status Within Functional Limits for tasks assessed      Posture/Postural Control   Posture Comments Forward head, rounded shoulders, R hip higher than L due to scoliosis.      AROM   Overall AROM Comments B hip and knee ROM grossly WFL, L ankle ROm deferred due to AFO.      Strength   Overall Strength Comments R proximal strength 5/5, L proximal strength grossly (3-)-3/5    Left Hip Flexion 3/5    Left Knee Flexion 3+/5    Left Knee Extension 2/5      Palpation   Palpation comment Patient report acute TTP on soft tissue just distal to iliac crest, in gluts, on L. Chronic TTP in lumbar paraspinals and L QL.      Transfers   Transfers Sit to  Stand;Stand to Sit    Sit to Stand 6: Modified independent (Device/Increase time)    Stand to Sit 6: Modified independent (Device/Increase time)      Ambulation/Gait   Gait Comments Ambulated with or without straight cane, step to gait pattern, LLE advanced with hip flexion and rotation, decreased stance on L.      Balance   Balance Assessed Yes      Standardized Balance Assessment   Standardized Balance Assessment Five Times Sit to Stand;Timed Up and Go Test;Berg Balance Test    Five times sit to stand comments  31.10      Timed Up and Go Test   Normal TUG (seconds) 23.96      LLE Tone   LLE Tone Mild                        Objective measurements completed on examination: See above findings.                PT Education - 01/04/22 1845     Education Details POC    Person(s) Educated Patient    Methods Explanation    Comprehension Verbalized understanding              PT Short Term Goals - 01/04/22 1851       PT SHORT TERM GOAL #1   Title I with basic HEP    Time 4    Period Weeks    Status New    Target Date 02/01/22               PT Long Term Goals - 01/04/22 1851       PT LONG TERM GOAL #1   Title I with final HEP    Time 8    Period Weeks    Status New    Target Date 03/01/22      PT LONG TERM GOAL #2   Title Pt will improve TUG score to less than or equal to 13 seconds or less for pt to not be considered fall risk.    Baseline 23.96    Time 8    Period Weeks    Status New    Target Date 03/01/22      PT LONG TERM GOAL #3   Title Improve 5 x STS time to <12 sec to demosntrate improved BLE strength.    Baseline 31.1-22" surface, no  hands    Time 8    Period Weeks    Status New    Target Date 03/01/22      PT LONG TERM GOAL #4   Title Patient will score at least 46/56 on BERG Balance test to demosntrate decreased fall risk.    Baseline TBD    Time 8    Period Weeks    Status New    Target Date 03/01/22       PT LONG TERM GOAL #5   Title Patient will walk at least 200', on level and unlevel surface, with LRAD, MI.    Baseline 100' with cane and S.    Time 8    Period Weeks    Status New    Target Date 03/01/22                    Plan - 01/04/22 1846     Clinical Impression Statement Patient presents requesting a "tune up" due to recent onset of decreased balance, weakness, deifficulty with transfers. she also reports a shapr "punching" pain in her L hip today as whe was placing a trashbag on the floor. soft tissue is sore. Evaluation confirms R side weakness, decreased balance, decreased coordinated motor control, increased difficulty wiht transfers and gait. She will benefit from PT to address strengthening, motor control re-education, balance, gait, functional mobility re-education.    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    Examination-Activity Limitations Locomotion Level;Transfers;Stairs;Stand    Examination-Participation Restrictions Community Activity;Other    Stability/Clinical Decision Making Stable/Uncomplicated    Clinical Decision Making Moderate    Rehab Potential Good    PT Frequency 2x / week    PT Duration 8 weeks    PT Treatment/Interventions ADLs/Self Care Home Management;DME Instruction;Gait training;Stair training;Functional mobility training;Therapeutic activities;Therapeutic exercise;Balance training;Neuromuscular re-education;Patient/family education;Passive range of motion;Orthotic Fit/Training;Electrical Stimulation;Moist Heat;Iontophoresis 4mg /ml Dexamethasone;Dry needling;Manual techniques    PT Next Visit Plan Complete BERG assessment    Consulted and Agree with Plan of Care Patient             Patient will benefit from skilled therapeutic intervention in order to improve the following deficits and impairments:  Abnormal gait, Difficulty  walking, Decreased range of motion, Impaired tone, Pain, Decreased balance, Decreased mobility, Decreased strength, Postural dysfunction, Improper body mechanics, Increased muscle spasms  Visit Diagnosis: Hemiplegia and hemiparesis following cerebral infarction affecting left non-dominant side (HCC)  Other lack of coordination  Other symptoms and signs involving the nervous system  Other symptoms and signs involving the musculoskeletal system  Other abnormalities of gait and mobility  Muscle weakness (generalized)  Unsteadiness on feet  Ataxia     Problem List Patient Active Problem List   Diagnosis Date Noted   Goals of care, counseling/discussion    Palliative care by specialist    Acute CVA (cerebrovascular accident) ([place]) 07/01/2020   Subcortical infarction ([place]) 09/05/2019   Anticoagulated on warfarin    Left hemiparesis (HCC)    History of CVA with residual deficit    Thrombocytopenia (HCC)    Prediabetes    CVA (cerebral vascular accident) ([place]) 09/03/2019   Vertigo 09/24/2017   Benign paroxysmal positional vertigo    History of stroke    Hyperlipemia 08/23/2017   Encounter for therapeutic drug monitoring 08/08/2017   Long term (current) use of anticoagulants [Z79.01] 07/12/2017   Hemiparesis and alteration of sensations as  late effects of stroke ([place]) 09/07/2016   Abnormality of gait 09/07/2016   HTN (hypertension) 04/13/2016   H/O mitral valve replacement with mechanical valve 10/16/2015   Diabetes mellitus without complication (HCC)    Hemiparesis (L sided - mild) due to old stroke ([place])     Marcelina Morel, DPT 01/04/2022, 6:58 PM  [facility]. [place], [place], [address] Phone: [phone]   Fax:  [phone]  Name: [name] MRN: [id number] Date of Birth: 24-May-1946

## 2022-01-07 ENCOUNTER — Encounter: Payer: Self-pay | Admitting: Physical Therapy

## 2022-01-07 ENCOUNTER — Ambulatory Visit: Payer: Medicare Other | Admitting: Physical Medicine & Rehabilitation

## 2022-01-07 ENCOUNTER — Other Ambulatory Visit: Payer: Self-pay

## 2022-01-07 ENCOUNTER — Ambulatory Visit: Payer: Medicare Other | Attending: Physical Medicine & Rehabilitation | Admitting: Physical Therapy

## 2022-01-07 DIAGNOSIS — R29898 Other symptoms and signs involving the musculoskeletal system: Secondary | ICD-10-CM | POA: Insufficient documentation

## 2022-01-07 DIAGNOSIS — I69354 Hemiplegia and hemiparesis following cerebral infarction affecting left non-dominant side: Secondary | ICD-10-CM | POA: Diagnosis not present

## 2022-01-07 DIAGNOSIS — R270 Ataxia, unspecified: Secondary | ICD-10-CM | POA: Diagnosis not present

## 2022-01-07 DIAGNOSIS — M6281 Muscle weakness (generalized): Secondary | ICD-10-CM | POA: Insufficient documentation

## 2022-01-07 DIAGNOSIS — R29818 Other symptoms and signs involving the nervous system: Secondary | ICD-10-CM | POA: Diagnosis not present

## 2022-01-07 DIAGNOSIS — R2689 Other abnormalities of gait and mobility: Secondary | ICD-10-CM | POA: Insufficient documentation

## 2022-01-07 DIAGNOSIS — R278 Other lack of coordination: Secondary | ICD-10-CM | POA: Diagnosis not present

## 2022-01-07 DIAGNOSIS — R2681 Unsteadiness on feet: Secondary | ICD-10-CM | POA: Diagnosis not present

## 2022-01-07 NOTE — Therapy (Signed)
[place]. [place], [place], [address] Phone: [phone]   Fax:  [phone]  Physical Therapy Treatment  Patient Details  Name: [name] MRN: [id number] Date of Birth: 02-Nov-1946 Referring Provider (PT): Dr Letta Pate   Encounter Date: 01/07/2022   PT End of Session - 01/07/22 1101     Visit Number 2    Number of Visits 16    Date for PT Re-Evaluation 03/01/22    PT Start Time 1016    PT Stop Time 1057    PT Time Calculation (min) 41 min    Activity Tolerance Patient tolerated treatment well    Behavior During Therapy [facility] for tasks assessed/performed             Past Medical History:  Diagnosis Date   Abnormality of gait 09/07/2016   Allergy    Diabetes mellitus without complication [facility])    Patient denies this - notes history of glucose intolerance   GERD (gastroesophageal reflux disease)    Hemiparesis and alteration of sensations as late effects of stroke ([place]) 09/07/2016   History of pneumonia 1997   Hypertension    S/P MVR (mitral valve replacement)    Mechanical mitral valve replacement at age 42 (done in [place])  // echo 7/17: EF 55-60, normal wall motion, bileaflet mechanical mitral valve prosthesis functioning normally, mild LAE, mildly reduced RVSF, small pericardial effusion   Stroke ([place]) 1997, 2013, 2015    Past Surgical History:  Procedure Laterality Date   [place]    There were no vitals filed for this visit.   Subjective Assessment - 01/07/22 1024     Subjective Doing OK today, here for a tune up and especially concerned about her balance    Patient Stated Goals She would like a tune up, strenghening, balance, coordination, pain contorl.    Currently in Pain? Yes    Pain Score 3     Pain Location Back    Pain Orientation Left;Lower                 [facility] PT Assessment - 01/07/22 0001       Berg Balance Test   Sit to Stand Able to stand  independently using hands    Standing Unsupported Able to stand 2 minutes with supervision    Sitting with Back Unsupported but Feet Supported on Floor or Stool Able to sit safely and securely 2 minutes    Stand to Sit Uses backs of legs against chair to control descent    Transfers Able to transfer with verbal cueing and /or supervision    Standing Unsupported with Eyes Closed Able to stand 10 seconds safely    Standing Unsupported with Feet Together Needs help to attain position but able to stand for 30 seconds with feet together    From Standing, Reach Forward with Outstretched Arm Can reach forward >12 cm safely (5")    From Standing Position, Pick up Object from Floor Able to pick up shoe, needs supervision    From Standing Position, Turn to Look Behind Over each Shoulder Looks behind one side only/other side shows less weight shift    Turn 360 Degrees Needs close supervision or verbal cueing    Standing Unsupported, Alternately Place Feet on Step/Stool Needs assistance to keep from falling  or unable to try    Standing Unsupported, One Foot in [employer] balance while stepping or standing    Standing on One Leg Unable to try or needs assist to prevent fall    Total Score 29                           [facility] Adult PT Treatment/Exercise - 01/07/22 0001       Exercises   Exercises Knee/Hip      Knee/Hip Exercises: Aerobic   Nustep L3 BLEs only x6 minutes      Knee/Hip Exercises: Standing   Forward Step Up Both;1 set;10 reps    Forward Step Up Limitations 4 inch step    Functional Squat 1 set;10 reps    Functional Squat Limitations mini squats   multimodal facilitation for good weight bearing L LE   Other Standing Knee Exercises marches 1x20 alternating 2# BUE support; hip ABD 1x10 B 2#                     PT Education - 01/07/22 1101     Education Details  implications of berg score, POC moving forward    Person(s) Educated Patient    Methods Explanation    Comprehension Verbalized understanding              PT Short Term Goals - 01/04/22 1851       PT SHORT TERM GOAL #1   Title I with basic HEP    Time 4    Period Weeks    Status New    Target Date 02/01/22               PT Long Term Goals - 01/04/22 1851       PT LONG TERM GOAL #1   Title I with final HEP    Time 8    Period Weeks    Status New    Target Date 03/01/22      PT LONG TERM GOAL #2   Title Pt will improve TUG score to less than or equal to 13 seconds or less for pt to not be considered fall risk.    Baseline 23.96    Time 8    Period Weeks    Status New    Target Date 03/01/22      PT LONG TERM GOAL #3   Title Improve 5 x STS time to <12 sec to demosntrate improved BLE strength.    Baseline 31.1-22" surface, no hands    Time 8    Period Weeks    Status New    Target Date 03/01/22      PT LONG TERM GOAL #4   Title Patient will score at least 46/56 on BERG Balance test to demosntrate decreased fall risk.    Baseline TBD    Time 8    Period Weeks    Status New    Target Date 03/01/22      PT LONG TERM GOAL #5   Title Patient will walk at least 200', on level and unlevel surface, with LRAD, MI.    Baseline 100' with cane and S.    Time 8    Period Weeks    Status New    Target Date 03/01/22                   Plan - 01/07/22 1102     Clinical  Impression Statement [name] arrives today doing OK, does have big concerns about her balance. We warmed up on the Nustep, then took Berg balance score at 29/56. Otherwise worked on functional strength in [employer] positions, did need close S to [employer] for balance with all activities today. Will continue efforts.    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    Examination-Activity Limitations  Locomotion Level;Transfers;Stairs;Stand    Examination-Participation Restrictions Community Activity;Other    Stability/Clinical Decision Making Stable/Uncomplicated    Clinical Decision Making Moderate    Rehab Potential Good    PT Frequency 2x / week    PT Duration 8 weeks    PT Treatment/Interventions ADLs/Self Care Home Management;DME Instruction;Gait training;Stair training;Functional mobility training;Therapeutic activities;Therapeutic exercise;Balance training;Neuromuscular re-education;Patient/family education;Passive range of motion;Orthotic Fit/Training;Electrical Stimulation;Moist Heat;Iontophoresis 4mg /ml Dexamethasone;Dry needling;Manual techniques    PT Next Visit Plan strength, balance, endurance, gait training, motor control    Consulted and Agree with Plan of Care Patient             Patient will benefit from skilled therapeutic intervention in order to improve the following deficits and impairments:  Abnormal gait, Difficulty walking, Decreased range of motion, Impaired tone, Pain, Decreased balance, Decreased mobility, Decreased strength, Postural dysfunction, Improper body mechanics, Increased muscle spasms  Visit Diagnosis: Hemiplegia and hemiparesis following cerebral infarction affecting left non-dominant side (HCC)  Other lack of coordination  Other symptoms and signs involving the nervous system     Problem List Patient Active Problem List   Diagnosis Date Noted   Goals of care, counseling/discussion    Palliative care by specialist    Acute CVA (cerebrovascular accident) ([place]) 07/01/2020   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   Anticoagulated on warfarin    Left hemiparesis ([place])    History of CVA with residual deficit    Thrombocytopenia (HCC)    Prediabetes    CVA (cerebral vascular accident) ([place]) 09/03/2019   Vertigo 09/24/2017   Benign paroxysmal positional vertigo    History of stroke    Hyperlipemia 08/23/2017   Encounter for  therapeutic drug monitoring 08/08/2017   Long term (current) use of anticoagulants [Z79.01] 07/12/2017   Hemiparesis and alteration of sensations as late effects of stroke ([place]) 09/07/2016   Abnormality of gait 09/07/2016   HTN (hypertension) 04/13/2016   H/O mitral valve replacement with mechanical valve 10/16/2015   Diabetes mellitus without complication (HCC)    Hemiparesis (L sided - mild) due to old stroke ([place])    Ann Lions PT, DPT, PN2   Supplemental Physical Therapist [place]. [place], [place], [address] Phone: [phone]   Fax:  [phone]  Name: [name] MRN: [id number] Date of Birth: 03/18/1946

## 2022-01-11 ENCOUNTER — Ambulatory Visit (INDEPENDENT_AMBULATORY_CARE_PROVIDER_SITE_OTHER): Payer: Medicare Other | Admitting: *Deleted

## 2022-01-11 DIAGNOSIS — I69359 Hemiplegia and hemiparesis following cerebral infarction affecting unspecified side: Secondary | ICD-10-CM

## 2022-01-11 DIAGNOSIS — Z5181 Encounter for therapeutic drug level monitoring: Secondary | ICD-10-CM | POA: Diagnosis not present

## 2022-01-11 DIAGNOSIS — Z7901 Long term (current) use of anticoagulants: Secondary | ICD-10-CM

## 2022-01-11 DIAGNOSIS — Z952 Presence of prosthetic heart valve: Secondary | ICD-10-CM | POA: Diagnosis not present

## 2022-01-11 LAB — POCT INR: INR: 3.2 — AB (ref 2.0–3.0)

## 2022-01-11 NOTE — Patient Instructions (Signed)
Description   ?Spoke with pt and instructed to continue taking warfarin '5mg'$  daily except 7.'5mg'$  on Mondays. Recheck INR in 1 week per INSURANCE. Coumadin Clinic [phone] ?  ?  ?

## 2022-01-12 ENCOUNTER — Encounter: Payer: Self-pay | Admitting: Physical Therapy

## 2022-01-12 ENCOUNTER — Other Ambulatory Visit: Payer: Self-pay

## 2022-01-12 ENCOUNTER — Ambulatory Visit: Payer: Medicare Other | Admitting: Physical Therapy

## 2022-01-12 DIAGNOSIS — R278 Other lack of coordination: Secondary | ICD-10-CM | POA: Diagnosis not present

## 2022-01-12 DIAGNOSIS — R29818 Other symptoms and signs involving the nervous system: Secondary | ICD-10-CM | POA: Diagnosis not present

## 2022-01-12 DIAGNOSIS — R2689 Other abnormalities of gait and mobility: Secondary | ICD-10-CM | POA: Diagnosis not present

## 2022-01-12 DIAGNOSIS — I69354 Hemiplegia and hemiparesis following cerebral infarction affecting left non-dominant side: Secondary | ICD-10-CM | POA: Diagnosis not present

## 2022-01-12 DIAGNOSIS — R29898 Other symptoms and signs involving the musculoskeletal system: Secondary | ICD-10-CM | POA: Diagnosis not present

## 2022-01-12 DIAGNOSIS — M6281 Muscle weakness (generalized): Secondary | ICD-10-CM | POA: Diagnosis not present

## 2022-01-12 NOTE — Therapy (Signed)
[place] ?[place] ?[address] ?[place], [place], [address] ?Phone: [phone]   Fax:  [phone] ? ?Physical Therapy Treatment ? ?Patient Details  ?Name: [name] ?MRN: [id number] ?Date of Birth: [birth year] ?Referring Provider (PT): Dr Letta Pate ? ? ?Encounter Date: 01/12/2022 ? ? PT End of Session - 01/12/22 1014   ? ? Visit Number 3   ? Number of Visits 16   ? Date for PT Re-Evaluation 03/01/22   ? PT Start Time [phone]   ? PT Stop Time 1012   ? PT Time Calculation (min) 39 min   ? Activity Tolerance Patient tolerated treatment well   ? Behavior During Therapy [facility] for tasks assessed/performed   ? ?  ?  ? ?  ? ? ?Past Medical History:  ?Diagnosis Date  ? Abnormality of gait 09/07/2016  ? Allergy   ? Diabetes mellitus without complication ([place])   ? Patient denies this - notes history of glucose intolerance  ? GERD (gastroesophageal reflux disease)   ? Hemiparesis and alteration of sensations as late effects of stroke ([place]) 09/07/2016  ? History of pneumonia 1997  ? Hypertension   ? S/P MVR (mitral valve replacement)   ? Mechanical mitral valve replacement at age 49 (done in [place])  // echo 7/17: EF 55-60, normal wall motion, bileaflet mechanical mitral valve prosthesis functioning normally, mild LAE, mildly reduced RVSF, small pericardial effusion  ? Stroke [facility]) 1997, 2013, 2015  ? ? ?Past Surgical History:  ?Procedure Laterality Date  ? ABDOMINAL HYSTERECTOMY    ? BRAIN SURGERY    ? BUNIONECTOMY  1993  ? CARDIAC VALVE REPLACEMENT  1997  ? TOE SURGERY  1996  ? ? ?There were no vitals filed for this visit. ? ? Subjective Assessment - 01/12/22 0935   ? ? Subjective I felt tired after last session, no increased pain with the workout we did. I got new shoes and it takes me awhile to get back in the groove and get used to new foot wear.   ? Patient Stated Goals She would like a tune up, strenghening, balance, coordination, pain contorl.   ? Currently in Pain? Yes   ?  Pain Score 2    ? Pain Location Foot   ? Pain Orientation Left   ? ?  ?  ? ?  ? ? ? ? ? ? ? ? ? ? ? ? ? ? ? ? ? ? ? ? [place] Adult PT Treatment/Exercise - 01/12/22 0001   ? ?  ? Knee/Hip Exercises: Aerobic  ? Nustep L4 BLEs only x6 minutes   ?  ? Knee/Hip Exercises: Standing  ? Heel Raises Both;1 set;15 reps   ? Heel Raises Limitations BUE support   ? Knee Flexion Left;1 set;10 reps   ? Lateral Step Up Both;1 set;10 reps   ? Lateral Step Up Limitations 4 inch step   ?  ? Knee/Hip Exercises: Seated  ? Long [employer] Both;2 sets;10 reps   ? Long [employer] Limitations green TB   ? Clamshell with Marga Hoots   1x20 focus on L LE activation  ? Marching Both;1 set;20 reps   ? Marching Limitations green TB   ? Hamstring Curl Both;2 sets;10 reps   ? Hamstring Limitations red TB   ? ?  ?  ? ?  ? ? ? ? ? ? Balance Exercises - 01/12/22 0001   ? ?  ? Balance Exercises: Standing  ? Standing  Eyes Opened Narrow base of support (BOS);1 rep   x60 seconds  ? ?  ?  ? ?  ? ? ? ? ? PT Education - 01/12/22 1014   ? ? Education Details exercise form/purpose   ? Person(s) Educated Patient   ? Methods Explanation   ? Comprehension Verbalized understanding   ? ?  ?  ? ?  ? ? ? PT Short Term Goals - 01/04/22 1851   ? ?  ? PT SHORT TERM GOAL #1  ? Title I with basic HEP   ? Time 4   ? Period Weeks   ? Status New   ? Target Date 02/01/22   ? ?  ?  ? ?  ? ? ? ? PT Long Term Goals - 01/04/22 1851   ? ?  ? PT LONG TERM GOAL #1  ? Title I with final HEP   ? Time 8   ? Period Weeks   ? Status New   ? Target Date 03/01/22   ?  ? PT LONG TERM GOAL #2  ? Title Pt will improve TUG score to less than or equal to 13 seconds or less for pt to not be considered fall risk.   ? Baseline 23.96   ? Time 8   ? Period Weeks   ? Status New   ? Target Date 03/01/22   ?  ? PT LONG TERM GOAL #3  ? Title Improve 5 x STS time to <12 sec to demosntrate improved BLE strength.   ? Baseline 31.1-22" surface, no hands   ? Time 8   ? Period Weeks   ? Status New   ? Target  Date 03/01/22   ?  ? PT LONG TERM GOAL #4  ? Title Patient will score at least 46/56 on BERG Balance test to demosntrate decreased fall risk.   ? Baseline TBD   ? Time 8   ? Period Weeks   ? Status New   ? Target Date 03/01/22   ?  ? PT LONG TERM GOAL #5  ? Title Patient will walk at least 200', on level and unlevel surface, with LRAD, MI.   ? Baseline 100' with cane and S.   ? Time 8   ? Period Weeks   ? Status New   ? Target Date 03/01/22   ? ?  ?  ? ?  ? ? ? ? ? ? ? ? Plan - 01/12/22 1015   ? ? Clinical Impression Statement [name] arrives today doing OK, was definitely fatigued after last session. We spent some time working on more focused strength for BLEs and progressed settings on Nustep followed by closed chain balance and strength activities. Will continue efforts.   ? Personal Factors and Comorbidities Comorbidity 3+   ? 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 Examination-Activity Limitations Locomotion Level;Transfers;Stairs;Stand   ? Examination-Participation Restrictions Community Activity;Other   ? Stability/Clinical Decision Making Stable/Uncomplicated   ? Clinical Decision Making Moderate   ? Rehab Potential Good   ? PT Frequency 2x / week   ? PT Duration 8 weeks   ? PT Treatment/Interventions ADLs/Self Care Home Management;DME Instruction;Gait training;Stair training;Functional mobility training;Therapeutic activities;Therapeutic exercise;Balance training;Neuromuscular re-education;Patient/family education;Passive range of motion;Orthotic Fit/Training;Electrical Stimulation;Moist Heat;Iontophoresis '4mg'$ /ml Dexamethasone;Dry needling;Manual techniques   ? PT Next Visit Plan strength, balance, endurance, gait training, motor control   ? Consulted and Agree with Plan of Care Patient   ? ?  ?  ? ?  ? ? ?  Patient will benefit from skilled therapeutic intervention in order to improve the following deficits and impairments:  Abnormal gait, Difficulty walking,  Decreased range of motion, Impaired tone, Pain, Decreased balance, Decreased mobility, Decreased strength, Postural dysfunction, Improper body mechanics, Increased muscle spasms ? ?Visit Diagnosis: ?Hemiplegia and hemiparesis following cerebral infarction affecting left non-dominant side ([place]) ? ?Other lack of coordination ? ?Other symptoms and signs involving the nervous system ? ? ? ? ?Problem List ?Patient Active Problem List  ? Diagnosis Date Noted  ? Goals of care, counseling/discussion   ? Palliative care by specialist   ? Acute CVA (cerebrovascular accident) ([place]) 07/01/2020  ? 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 Anticoagulated on warfarin   ? Left hemiparesis ([place])   ? History of CVA with residual deficit   ? Thrombocytopenia ([place])   ? Prediabetes   ? CVA (cerebral vascular accident) ([place]) 09/03/2019  ? Vertigo 09/24/2017  ? Benign paroxysmal positional vertigo   ? History of stroke   ? Hyperlipemia 08/23/2017  ? Encounter for therapeutic drug monitoring 08/08/2017  ? Long term (current) use of anticoagulants [Z79.01] 07/12/2017  ? Hemiparesis and alteration of sensations as late effects of stroke ([place]) 09/07/2016  ? Abnormality of gait 09/07/2016  ? HTN (hypertension) 04/13/2016  ? H/O mitral valve replacement with mechanical valve 10/16/2015  ? Diabetes mellitus without complication ([place])   ? Hemiparesis (L sided - mild) due to old stroke [facility])   ? ?Zoii Florer U PT, DPT, PN2  ? ?Supplemental Physical Therapist ?[place]  ? ? ? ? ? ?[place] ?[place] [address] ?[place], [place], [address] ?Phone: [phone]   Fax:  [phone] ? ?Name: [name] ?MRN: [id number] ?Date of Birth: [birth year] ? ? ? ?

## 2022-01-14 ENCOUNTER — Encounter: Payer: Self-pay | Admitting: Physical Therapy

## 2022-01-14 ENCOUNTER — Ambulatory Visit: Payer: Medicare Other | Admitting: Physical Therapy

## 2022-01-14 ENCOUNTER — Other Ambulatory Visit: Payer: Self-pay

## 2022-01-14 DIAGNOSIS — R2689 Other abnormalities of gait and mobility: Secondary | ICD-10-CM | POA: Diagnosis not present

## 2022-01-14 DIAGNOSIS — M6281 Muscle weakness (generalized): Secondary | ICD-10-CM | POA: Diagnosis not present

## 2022-01-14 DIAGNOSIS — R278 Other lack of coordination: Secondary | ICD-10-CM

## 2022-01-14 DIAGNOSIS — I69354 Hemiplegia and hemiparesis following cerebral infarction affecting left non-dominant side: Secondary | ICD-10-CM

## 2022-01-14 DIAGNOSIS — R29818 Other symptoms and signs involving the nervous system: Secondary | ICD-10-CM | POA: Diagnosis not present

## 2022-01-14 DIAGNOSIS — R29898 Other symptoms and signs involving the musculoskeletal system: Secondary | ICD-10-CM | POA: Diagnosis not present

## 2022-01-14 NOTE — Therapy (Signed)
[place] ?[place] ?[address] ?[place], [place], [address] ?Phone: [phone]   Fax:  [phone] ? ?Physical Therapy Treatment ? ?Patient Details  ?Name: [name] ?MRN: [id number] ?Date of Birth: [birth year] ?Referring Provider (PT): Dr Letta Pate ? ? ?Encounter Date: 01/14/2022 ? ? PT End of Session - 01/14/22 1012   ? ? Visit Number 4   ? Number of Visits 16   ? Date for PT Re-Evaluation 03/01/22   ? PT Start Time [phone]   ? PT Stop Time 1012   ? PT Time Calculation (min) 39 min   ? Activity Tolerance Patient tolerated treatment well   ? Behavior During Therapy [facility] for tasks assessed/performed   ? ?  ?  ? ?  ? ? ?Past Medical History:  ?Diagnosis Date  ? Abnormality of gait 09/07/2016  ? Allergy   ? Diabetes mellitus without complication ([place])   ? Patient denies this - notes history of glucose intolerance  ? GERD (gastroesophageal reflux disease)   ? Hemiparesis and alteration of sensations as late effects of stroke ([place]) 09/07/2016  ? History of pneumonia 1997  ? Hypertension   ? S/P MVR (mitral valve replacement)   ? Mechanical mitral valve replacement at age 41 (done in [place])  // echo 7/17: EF 55-60, normal wall motion, bileaflet mechanical mitral valve prosthesis functioning normally, mild LAE, mildly reduced RVSF, small pericardial effusion  ? Stroke [facility]) 1997, 2013, 2015  ? ? ?Past Surgical History:  ?Procedure Laterality Date  ? ABDOMINAL HYSTERECTOMY    ? BRAIN SURGERY    ? BUNIONECTOMY  1993  ? CARDIAC VALVE REPLACEMENT  1997  ? TOE SURGERY  1996  ? ? ?There were no vitals filed for this visit. ? ? Subjective Assessment - 01/14/22 0934   ? ? Subjective I felt tired after last session, you definitely wore me out. No pain.   ? Patient Stated Goals She would like a tune up, strenghening, balance, coordination, pain contorl.   ? Currently in Pain? Yes   ? Pain Score 1    ? Pain Location Back   ? Pain Orientation Right   ? Pain Descriptors / Indicators  Discomfort   ? Pain Type Chronic pain   ? ?  ?  ? ?  ? ? ? ? ? ? ? ? ? ? ? ? ? ? ? ? ? ? ? ? [place] Adult PT Treatment/Exercise - 01/14/22 0001   ? ?  ? Knee/Hip Exercises: Aerobic  ? Nustep L4 BLEs only x6 minutes   ?  ? Knee/Hip Exercises: Seated  ? Long [employer] Both;1 set;Strengthening;15 reps   ? Long Arc Quad Weight 3 lbs.   ? Sit to Sand without UE support;1 set;5 reps   ?  ? Knee/Hip Exercises: Supine  ? Bridges Both;1 set;15 reps   ? Bridges with Clamshell 1 set;Both;10 reps   ? Other Supine Knee/Hip Exercises supine mraches 1x20   ? ?  ?  ? ?  ? ? ? ? ? ? ? ? ? ? PT Education - 01/14/22 1011   ? ? Education Details exercise form/purpose, tone overflow, role of purposeful repeated training in improvement after chroinc CVA   ? Person(s) Educated Patient   ? Methods Explanation   ? Comprehension Verbalized understanding   ? ?  ?  ? ?  ? ? ? PT Short Term Goals - 01/04/22 1851   ? ?  ? PT SHORT  TERM GOAL #1  ? Title I with basic HEP   ? Time 4   ? Period Weeks   ? Status New   ? Target Date 02/01/22   ? ?  ?  ? ?  ? ? ? ? PT Long Term Goals - 01/04/22 1851   ? ?  ? PT LONG TERM GOAL #1  ? Title I with final HEP   ? Time 8   ? Period Weeks   ? Status New   ? Target Date 03/01/22   ?  ? PT LONG TERM GOAL #2  ? Title Pt will improve TUG score to less than or equal to 13 seconds or less for pt to not be considered fall risk.   ? Baseline 23.96   ? Time 8   ? Period Weeks   ? Status New   ? Target Date 03/01/22   ?  ? PT LONG TERM GOAL #3  ? Title Improve 5 x STS time to <12 sec to demosntrate improved BLE strength.   ? Baseline 31.1-22" surface, no hands   ? Time 8   ? Period Weeks   ? Status New   ? Target Date 03/01/22   ?  ? PT LONG TERM GOAL #4  ? Title Patient will score at least 46/56 on BERG Balance test to demosntrate decreased fall risk.   ? Baseline TBD   ? Time 8   ? Period Weeks   ? Status New   ? Target Date 03/01/22   ?  ? PT LONG TERM GOAL #5  ? Title Patient will walk at least 200', on level and  unlevel surface, with LRAD, MI.   ? Baseline 100' with cane and S.   ? Time 8   ? Period Weeks   ? Status New   ? Target Date 03/01/22   ? ?  ?  ? ?  ? ? ? ? ? ? ? ? Plan - 01/14/22 1012   ? ? Clinical Impression Statement [name] arrives doing OK today, having a bit of back pain but nothing beyond her usual. We continued working on the [employer] for LE strength and reciprocal motion training, otherwise spent more time on the mat table in sitting and supine working on strength and general L LE proprioception. Still very easily fatigued. Actually had some moderate trunk extensor tone and needed ModA for balance when we were working on eccentric stand to sits- maybe from tone overflow? Will continue efforts.   ? Personal Factors and Comorbidities Comorbidity 3+   ? 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 Examination-Activity Limitations Locomotion Level;Transfers;Stairs;Stand   ? Examination-Participation Restrictions Community Activity;Other   ? Stability/Clinical Decision Making Stable/Uncomplicated   ? Clinical Decision Making Moderate   ? Rehab Potential Good   ? PT Frequency 2x / week   ? PT Duration 8 weeks   ? PT Treatment/Interventions ADLs/Self Care Home Management;DME Instruction;Gait training;Stair training;Functional mobility training;Therapeutic activities;Therapeutic exercise;Balance training;Neuromuscular re-education;Patient/family education;Passive range of motion;Orthotic Fit/Training;Electrical Stimulation;Moist Heat;Iontophoresis '4mg'$ /ml Dexamethasone;Dry needling;Manual techniques   ? PT Next Visit Plan strength, balance, endurance, gait training, motor control   ? Consulted and Agree with Plan of Care Patient   ? ?  ?  ? ?  ? ? ?Patient will benefit from skilled therapeutic intervention in order to improve the following deficits and impairments:  Abnormal gait, Difficulty walking, Decreased range of motion, Impaired tone, Pain, Decreased  balance,  Decreased mobility, Decreased strength, Postural dysfunction, Improper body mechanics, Increased muscle spasms ? ?Visit Diagnosis: ?Hemiplegia and hemiparesis following cerebral infarction affecting left non-dominant side ([place]) ? ?Other lack of coordination ? ?Other symptoms and signs involving the nervous system ? ? ? ? ?Problem List ?Patient Active Problem List  ? Diagnosis Date Noted  ? Goals of care, counseling/discussion   ? Palliative care by specialist   ? Acute CVA (cerebrovascular accident) ([place]) 07/01/2020  ? 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 Anticoagulated on warfarin   ? Left hemiparesis ([place])   ? History of CVA with residual deficit   ? Thrombocytopenia ([place])   ? Prediabetes   ? CVA (cerebral vascular accident) ([place]) 09/03/2019  ? Vertigo 09/24/2017  ? Benign paroxysmal positional vertigo   ? History of stroke   ? Hyperlipemia 08/23/2017  ? Encounter for therapeutic drug monitoring 08/08/2017  ? Long term (current) use of anticoagulants [Z79.01] 07/12/2017  ? Hemiparesis and alteration of sensations as late effects of stroke ([place]) 09/07/2016  ? Abnormality of gait 09/07/2016  ? HTN (hypertension) 04/13/2016  ? H/O mitral valve replacement with mechanical valve 10/16/2015  ? Diabetes mellitus without complication ([place])   ? Hemiparesis (L sided - mild) due to old stroke [facility])   ? ?[name] U PT, DPT, PN2  ? ?Supplemental Physical Therapist ?[place]  ? ? ? ? ? ?[place] ?[place] ?[address] ?[place], [place], [address] ?Phone: [phone]   Fax:  [phone] ? ?Name: [name] ?MRN: [id number] ?Date of Birth: 02-25-1946 ? ? ? ?

## 2022-01-18 ENCOUNTER — Ambulatory Visit (INDEPENDENT_AMBULATORY_CARE_PROVIDER_SITE_OTHER): Payer: Medicare Other

## 2022-01-18 DIAGNOSIS — Z5181 Encounter for therapeutic drug level monitoring: Secondary | ICD-10-CM | POA: Diagnosis not present

## 2022-01-18 DIAGNOSIS — Z7901 Long term (current) use of anticoagulants: Secondary | ICD-10-CM

## 2022-01-18 DIAGNOSIS — Z952 Presence of prosthetic heart valve: Secondary | ICD-10-CM

## 2022-01-18 DIAGNOSIS — I69359 Hemiplegia and hemiparesis following cerebral infarction affecting unspecified side: Secondary | ICD-10-CM

## 2022-01-18 LAB — POCT INR: INR: 3.4 — AB (ref 2.0–3.0)

## 2022-01-18 NOTE — Patient Instructions (Signed)
Description   ?Spoke with pt and instructed to continue taking warfarin '5mg'$  daily except 7.'5mg'$  on Mondays. Recheck INR in 1 week per INSURANCE. Coumadin Clinic [phone] ?  ?  ?

## 2022-01-19 ENCOUNTER — Other Ambulatory Visit: Payer: Self-pay

## 2022-01-19 ENCOUNTER — Ambulatory Visit: Payer: Medicare Other | Admitting: Physical Therapy

## 2022-01-19 ENCOUNTER — Encounter: Payer: Self-pay | Admitting: Physical Therapy

## 2022-01-19 DIAGNOSIS — R29818 Other symptoms and signs involving the nervous system: Secondary | ICD-10-CM | POA: Diagnosis not present

## 2022-01-19 DIAGNOSIS — R2689 Other abnormalities of gait and mobility: Secondary | ICD-10-CM | POA: Diagnosis not present

## 2022-01-19 DIAGNOSIS — R278 Other lack of coordination: Secondary | ICD-10-CM

## 2022-01-19 DIAGNOSIS — R29898 Other symptoms and signs involving the musculoskeletal system: Secondary | ICD-10-CM

## 2022-01-19 DIAGNOSIS — I69354 Hemiplegia and hemiparesis following cerebral infarction affecting left non-dominant side: Secondary | ICD-10-CM

## 2022-01-19 DIAGNOSIS — R270 Ataxia, unspecified: Secondary | ICD-10-CM

## 2022-01-19 DIAGNOSIS — R2681 Unsteadiness on feet: Secondary | ICD-10-CM

## 2022-01-19 DIAGNOSIS — M6281 Muscle weakness (generalized): Secondary | ICD-10-CM

## 2022-01-19 NOTE — Therapy (Signed)
[facility] ?[place], [place], [address] ?Phone: [phone]   Fax:  [phone] ? ?Physical Therapy Treatment ? ?Patient Details  ?Name: [name] ?MRN: [id number] ?Date of Birth: 1945/11/11 ?Referring Provider (PT): Dr Letta Pate ? ? ?Encounter Date: 01/19/2022 ? ? PT End of Session - 01/19/22 1136   ? ? Visit Number 5   ? Number of Visits 16   ? Date for PT Re-Evaluation 03/01/22   ? PT Start Time [id number]   ? PT Stop Time 1015   ? PT Time Calculation (min) 44 min   ? Activity Tolerance Patient tolerated treatment well   ? Behavior During Therapy [facility] for tasks assessed/performed   ? ?  ?  ? ?  ? ? ?Past Medical History:  ?Diagnosis Date  ? Abnormality of gait 09/07/2016  ? Allergy   ? Diabetes mellitus without complication ([place])   ? Patient denies this - notes history of glucose intolerance  ? GERD (gastroesophageal reflux disease)   ? Hemiparesis and alteration of sensations as late effects of stroke ([place]) 09/07/2016  ? History of pneumonia 1997  ? Hypertension   ? S/P MVR (mitral valve replacement)   ? Mechanical mitral valve replacement at age 20 (done in [place])  // echo 7/17: EF 55-60, normal wall motion, bileaflet mechanical mitral valve prosthesis functioning normally, mild LAE, mildly reduced RVSF, small pericardial effusion  ? Stroke [facility]) 1997, 2013, 2015  ? ? ?Past Surgical History:  ?Procedure Laterality Date  ? ABDOMINAL HYSTERECTOMY    ? BRAIN SURGERY    ? BUNIONECTOMY  1993  ? CARDIAC VALVE REPLACEMENT  1997  ? TOE SURGERY  1996  ? ? ?There were no vitals filed for this visit. ? ? Subjective Assessment - 01/19/22 0936   ? ? Subjective Patient reports she was very tired again after last treatment. Her back pain is controlled as long as she does her exercises. She also uses heat and Ephraim Hamburger.   ? Currently in Pain? No/denies   ? ?  ?  ? ?  ? ? ? ? ? ? ? ? ? ? ? ? ? ? ? ? ? ? ? ? [facility] Adult PT Treatment/Exercise - 01/19/22 0001   ? ?  ?  Ambulation/Gait  ? Gait Comments After treatemtn activities, patient walked 1 x 80' with straight cane and CGA, she automatically demosntrated step through gait pattern and WB through LLE with improved swing and toe clearance as well.   ?  ? Neuro Re-ed   ? Neuro Re-ed Details  Patient arrived demosntrating step to gait with minimal WB through LLE in stance, difficulty advancing it. Moved to mat to activate LLE and attempt to increase patient's awareness of her L side. rolled to L side lying, but had diffiuclty maintaining due to pain in her L hip. Rolled to R side lie and therapsit facilitated isolated L hemipelvis mobility, then active L hip abd with slow forward swing, touching mat, then back and touch mat. Repeated x 7 reps, min-mod A for abd. She then performed L upper trunk flexion, rotation to lift and reach therapist's houlder x 8 reps. Moved to sit and therapist facilitated lower trunk mobility in all directions on physiodisk. Therapist then facilitated forward weight shifts in midline, progressing to sit to stand, therapist facilitating LLE WB and activation throughout the movement. Moved R foot forward, had to elevate surface, but she improved in her ability to push with  LLE. THen progressed to standign small steps to the side and out into rotation iwth RLE. Initially unable, but improved with repetition.   ?  ? Knee/Hip Exercises: Supine  ? Other Supine Knee/Hip Exercises lower turnk sid eto side sway with hips elevated off surface of mat. Increased diffiuclty moving ot L.   ? ?  ?  ? ?  ? ? ? ? ? ? ? ? ? ? PT Education - 01/19/22 1135   ? ? Education Details Educated paiten tand her husband to perform mini squats at home in a very controlled manner to facilitate LLE WB and activation. Paitent returned demo.   ? Person(s) Educated Patient;Spouse   ? Methods Demonstration;Explanation   ? Comprehension Returned demonstration;Verbalized understanding   ? ?  ?  ? ?  ? ? ? PT Short Term Goals - 01/19/22 1139    ? ?  ? PT SHORT TERM GOAL #1  ? Title I with basic HEP   ? Baseline Initiated   ? Time 3   ? Period Weeks   ? Status On-going   ? Target Date 02/01/22   ? ?  ?  ? ?  ? ? ? ? PT Long Term Goals - 01/04/22 1851   ? ?  ? PT LONG TERM GOAL #1  ? Title I with final HEP   ? Time 8   ? Period Weeks   ? Status New   ? Target Date 03/01/22   ?  ? PT LONG TERM GOAL #2  ? Title Pt will improve TUG score to less than or equal to 13 seconds or less for pt to not be considered fall risk.   ? Baseline 23.96   ? Time 8   ? Period Weeks   ? Status New   ? Target Date 03/01/22   ?  ? PT LONG TERM GOAL #3  ? Title Improve 5 x STS time to <12 sec to demosntrate improved BLE strength.   ? Baseline 31.1-22" surface, no hands   ? Time 8   ? Period Weeks   ? Status New   ? Target Date 03/01/22   ?  ? PT LONG TERM GOAL #4  ? Title Patient will score at least 46/56 on BERG Balance test to demosntrate decreased fall risk.   ? Baseline TBD   ? Time 8   ? Period Weeks   ? Status New   ? Target Date 03/01/22   ?  ? PT LONG TERM GOAL #5  ? Title Patient will walk at least 200', on level and unlevel surface, with LRAD, MI.   ? Baseline 100' with cane and S.   ? Time 8   ? Period Weeks   ? Status New   ? Target Date 03/01/22   ? ?  ?  ? ?  ? ? ? ? ? ? ? ? Plan - 01/19/22 1136   ? ? Clinical Impression Statement Patient arrived walking with step to gait, little WB through LLE. Therpaist facilitated active control, improved awareness of, and incresaed WB through LLE in sit to stand and standing. She responded beautifully with improved gait pattern and LLE activation after treatment activities. She verbalized that she was more aware of her LLE in gait as well.   ? Personal Factors and Comorbidities Comorbidity 3+   ? 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 Examination-Activity Limitations Locomotion Level;Transfers;Stairs;Stand   ?  Examination-Participation Restrictions Community Activity;Other   ?  Stability/Clinical Decision Making Stable/Uncomplicated   ? Clinical Decision Making Moderate   ? Rehab Potential Good   ? PT Frequency 2x / week   ? PT Duration 6 weeks   ? PT Treatment/Interventions ADLs/Self Care Home Management;DME Instruction;Gait training;Stair training;Functional mobility training;Therapeutic activities;Therapeutic exercise;Balance training;Neuromuscular re-education;Patient/family education;Passive range of motion;Orthotic Fit/Training;Electrical Stimulation;Moist Heat;Iontophoresis '4mg'$ /ml Dexamethasone;Dry needling;Manual techniques   ? PT Next Visit Plan strength, balance, endurance, gait training, motor control   ? PT Home Exercise Plan Mini squats   ? Consulted and Agree with Plan of Care Patient   ? ?  ?  ? ?  ? ? ?Patient will benefit from skilled therapeutic intervention in order to improve the following deficits and impairments:  Abnormal gait, Difficulty walking, Decreased range of motion, Impaired tone, Pain, Decreased balance, Decreased mobility, Decreased strength, Postural dysfunction, Improper body mechanics, Increased muscle spasms ? ?Visit Diagnosis: ?Hemiplegia and hemiparesis following cerebral infarction affecting left non-dominant side ([place]) ? ?Other lack of coordination ? ?Other symptoms and signs involving the nervous system ? ?Other symptoms and signs involving the musculoskeletal system ? ?Other abnormalities of gait and mobility ? ?Muscle weakness (generalized) ? ?Unsteadiness on feet ? ?Ataxia ? ? ? ? ?Problem List ?Patient Active Problem List  ? Diagnosis Date Noted  ? Goals of care, counseling/discussion   ? Palliative care by specialist   ? Acute CVA (cerebrovascular accident) ([place]) 07/01/2020  ? 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 Anticoagulated on warfarin   ? Left hemiparesis ([place])   ? History of CVA with residual deficit   ? Thrombocytopenia ([place])   ? Prediabetes   ? CVA (cerebral vascular accident) ([place]) 09/03/2019  ? Vertigo 09/24/2017  ? Benign  paroxysmal positional vertigo   ? History of stroke   ? Hyperlipemia 08/23/2017  ? Encounter for therapeutic drug monitoring 08/08/2017  ? Long term (current) use of anticoagulants [Z79.01] 07/12/2017  ? H

## 2022-01-21 ENCOUNTER — Other Ambulatory Visit: Payer: Self-pay

## 2022-01-21 ENCOUNTER — Encounter: Payer: Self-pay | Admitting: Physical Therapy

## 2022-01-21 ENCOUNTER — Ambulatory Visit: Payer: Medicare Other | Admitting: Physical Therapy

## 2022-01-21 DIAGNOSIS — I69354 Hemiplegia and hemiparesis following cerebral infarction affecting left non-dominant side: Secondary | ICD-10-CM | POA: Diagnosis not present

## 2022-01-21 DIAGNOSIS — R29898 Other symptoms and signs involving the musculoskeletal system: Secondary | ICD-10-CM | POA: Diagnosis not present

## 2022-01-21 DIAGNOSIS — M6281 Muscle weakness (generalized): Secondary | ICD-10-CM | POA: Diagnosis not present

## 2022-01-21 DIAGNOSIS — R29818 Other symptoms and signs involving the nervous system: Secondary | ICD-10-CM | POA: Diagnosis not present

## 2022-01-21 DIAGNOSIS — R2689 Other abnormalities of gait and mobility: Secondary | ICD-10-CM | POA: Diagnosis not present

## 2022-01-21 DIAGNOSIS — R278 Other lack of coordination: Secondary | ICD-10-CM | POA: Diagnosis not present

## 2022-01-21 NOTE — Therapy (Signed)
[facility] [address] ?[place], [place], [address] ?Phone: [phone]   Fax:  [phone] ? ?Physical Therapy Treatment ? ?Patient Details  ?Name: [name] ?MRN: [id number] ?Date of Birth: Dec 07, 1945 ?Referring Provider (PT): Dr Letta Pate ? ? ?Encounter Date: 01/21/2022 ? ? PT End of Session - 01/21/22 1530   ? ? Visit Number 6   ? Number of Visits 16   ? Date for PT Re-Evaluation 03/01/22   ? PT Start Time 4967   ? PT Stop Time 5916   ? PT Time Calculation (min) 41 min   ? Activity Tolerance Patient tolerated treatment well   ? Behavior During Therapy [facility] for tasks assessed/performed   ? ?  ?  ? ?  ? ? ?Past Medical History:  ?Diagnosis Date  ? Abnormality of gait 09/07/2016  ? Allergy   ? Diabetes mellitus without complication ([place])   ? Patient denies this - notes history of glucose intolerance  ? GERD (gastroesophageal reflux disease)   ? Hemiparesis and alteration of sensations as late effects of stroke ([place]) 09/07/2016  ? History of pneumonia 1997  ? Hypertension   ? S/P MVR (mitral valve replacement)   ? Mechanical mitral valve replacement at age 89 (done in [place])  // echo 7/17: EF 55-60, normal wall motion, bileaflet mechanical mitral valve prosthesis functioning normally, mild LAE, mildly reduced RVSF, small pericardial effusion  ? Stroke [facility]) 1997, 2013, 2015  ? ? ?Past Surgical History:  ?Procedure Laterality Date  ? ABDOMINAL HYSTERECTOMY    ? BRAIN SURGERY    ? BUNIONECTOMY  1993  ? CARDIAC VALVE REPLACEMENT  1997  ? TOE SURGERY  1996  ? ? ?There were no vitals filed for this visit. ? ? Subjective Assessment - 01/21/22 1446   ? ? Subjective I was really wiped out after last session, she tired me out. My back has been bothering me. I feel like my shoe sticks to the floor even when the floor is not sticky, my back bothers me when I bend down to fix it. I feel like my toes get stuck and I can't move them.   ? Patient Stated Goals She would  like a tune up, strenghening, balance, coordination, pain contorl.   ? Currently in Pain? Yes   ? Pain Score 1    ? Pain Location Back   ? Pain Orientation Right   ? Pain Descriptors / Indicators Discomfort   ? ?  ?  ? ?  ? ? ? ? ? ? ? ? ? ? ? ? ? ? ? ? ? ? ? ? [facility] Adult PT Treatment/Exercise - 01/21/22 0001   ? ?  ? Knee/Hip Exercises: Aerobic  ? Nustep L4 BLEs only x6 minutes   ?  ? Knee/Hip Exercises: Standing  ? Other Standing Knee Exercises standing cross midline reaches with cones with focus on loading weight onto L LE/motor control L UE x2 rounds min guard for safety   ? Other Standing Knee Exercises alternating toe taps on 4 inch step 1x20; one-two stair taps 1x5 each LE   no UE support, Min guard to [place] for balance with challenging tasks  ?  ? Knee/Hip Exercises: Seated  ? Other Seated Knee/Hip Exercises seated cross midline reaches placing cones on and taking cones off table, cues for increased wt shift and trunk elongation to the L in sitting 2 rounds; lateral reaches to place cones as far to the left  of her as possible with LUE, then cross midline reaches with RUE to get cones and place them back on her right side x2 rounds   ? ?  ?  ? ?  ? ? ? ? ? ? ? ? ? ? PT Education - 01/21/22 1530   ? ? Education Details exercise form and purpose   ? Person(s) Educated Patient   ? Methods Explanation   ? Comprehension Verbalized understanding   ? ?  ?  ? ?  ? ? ? PT Short Term Goals - 01/19/22 1139   ? ?  ? PT SHORT TERM GOAL #1  ? Title I with basic HEP   ? Baseline Initiated   ? Time 3   ? Period Weeks   ? Status On-going   ? Target Date 02/01/22   ? ?  ?  ? ?  ? ? ? ? PT Long Term Goals - 01/04/22 1851   ? ?  ? PT LONG TERM GOAL #1  ? Title I with final HEP   ? Time 8   ? Period Weeks   ? Status New   ? Target Date 03/01/22   ?  ? PT LONG TERM GOAL #2  ? Title Pt will improve TUG score to less than or equal to 13 seconds or less for pt to not be considered fall risk.   ? Baseline 23.96   ? Time 8   ? Period  Weeks   ? Status New   ? Target Date 03/01/22   ?  ? PT LONG TERM GOAL #3  ? Title Improve 5 x STS time to <12 sec to demosntrate improved BLE strength.   ? Baseline 31.1-22" surface, no hands   ? Time 8   ? Period Weeks   ? Status New   ? Target Date 03/01/22   ?  ? PT LONG TERM GOAL #4  ? Title Patient will score at least 46/56 on BERG Balance test to demosntrate decreased fall risk.   ? Baseline TBD   ? Time 8   ? Period Weeks   ? Status New   ? Target Date 03/01/22   ?  ? PT LONG TERM GOAL #5  ? Title Patient will walk at least 200', on level and unlevel surface, with LRAD, MI.   ? Baseline 100' with cane and S.   ? Time 8   ? Period Weeks   ? Status New   ? Target Date 03/01/22   ? ?  ?  ? ?  ? ? ? ? ? ? ? ? Plan - 01/21/22 1530   ? ? Clinical Impression Statement Ms. Brosious arrives today doing OK, she does get very tired from PT workouts so far. We spent some time working on sitting and standing cross midline reaching to help facilitate weight shift and motor control/proprioception for L side. 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 She tells me sometimes her L foot/toes stick to the floor when she tries to pull her leg through and she has to twist her back to free it- we will monitor this, if it continues at same frequency or gets worse we might need to consider a consult for a possible shoe cap. Will continue efforts.   ? Personal Factors and Comorbidities Comorbidity 3+   ? 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 Examination-Activity Limitations Locomotion Level;Transfers;Stairs;Stand   ? Examination-Participation  Restrictions Community Activity;Other   ? Stability/Clinical Decision Making Stable/Uncomplicated   ? Clinical Decision Making Moderate   ? Rehab Potential Good   ? PT Frequency 2x / week   ? PT Duration 6 weeks   ? PT Treatment/Interventions ADLs/Self Care Home Management;DME Instruction;Gait training;Stair  training;Functional mobility training;Therapeutic activities;Therapeutic exercise;Balance training;Neuromuscular re-education;Patient/family education;Passive range of motion;Orthotic Fit/Training;Electrical Stimulation;Moist Heat;Iontophoresis '4mg'$ /ml Dexamethasone;Dry needling;Manual techniques   ? PT Next Visit Plan strength, balance, endurance, gait training, motor control   ? PT Home Exercise Plan Mini squats   ? Consulted and Agree with Plan of Care Patient   ? ?  ?  ? ?  ? ? ?Patient will benefit from skilled therapeutic intervention in order to improve the following deficits and impairments:  Abnormal gait, Difficulty walking, Decreased range of motion, Impaired tone, Pain, Decreased balance, Decreased mobility, Decreased strength, Postural dysfunction, Improper body mechanics, Increased muscle spasms ? ?Visit Diagnosis: ?Hemiplegia and hemiparesis following cerebral infarction affecting left non-dominant side ([place]) ? ?Other lack of coordination ? ?Other symptoms and signs involving the nervous system ? ? ? ? ?Problem List ?Patient Active Problem List  ? Diagnosis Date Noted  ? Goals of care, counseling/discussion   ? Palliative care by specialist   ? Acute CVA (cerebrovascular accident) ([place]) 07/01/2020  ? 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 Anticoagulated on warfarin   ? Left hemiparesis ([place])   ? History of CVA with residual deficit   ? Thrombocytopenia ([place])   ? Prediabetes   ? CVA (cerebral vascular accident) ([place]) 09/03/2019  ? Vertigo 09/24/2017  ? Benign paroxysmal positional vertigo   ? History of stroke   ? Hyperlipemia 08/23/2017  ? Encounter for therapeutic drug monitoring 08/08/2017  ? Long term (current) use of anticoagulants [Z79.01] 07/12/2017  ? Hemiparesis and alteration of sensations as late effects of stroke ([place]) 09/07/2016  ? Abnormality of gait 09/07/2016  ? HTN (hypertension) 04/13/2016  ? H/O mitral valve replacement with mechanical valve 10/16/2015  ? Diabetes mellitus without  complication ([place])   ? Hemiparesis (L sided - mild) due to old stroke [facility])   ? ?Mateusz Neilan U PT, DPT, PN2  ? ?Supplemental Physical Therapist ?[place]  ? ? ? ? ? ?[place] ?[place]

## 2022-01-25 ENCOUNTER — Ambulatory Visit (INDEPENDENT_AMBULATORY_CARE_PROVIDER_SITE_OTHER): Payer: Medicare Other | Admitting: *Deleted

## 2022-01-25 DIAGNOSIS — Z7901 Long term (current) use of anticoagulants: Secondary | ICD-10-CM | POA: Diagnosis not present

## 2022-01-25 DIAGNOSIS — I69359 Hemiplegia and hemiparesis following cerebral infarction affecting unspecified side: Secondary | ICD-10-CM | POA: Diagnosis not present

## 2022-01-25 DIAGNOSIS — Z952 Presence of prosthetic heart valve: Secondary | ICD-10-CM

## 2022-01-25 DIAGNOSIS — Z5181 Encounter for therapeutic drug level monitoring: Secondary | ICD-10-CM

## 2022-01-25 LAB — POCT INR: INR: 2.6 (ref 2.0–3.0)

## 2022-01-25 NOTE — Patient Instructions (Signed)
Description   ?Spoke with pt and instructed to take '10mg'$  (2 tablets) today then continue taking warfarin '5mg'$  daily except 7.'5mg'$  on Mondays. Pt eats broccoli on Fridays. Recheck INR in 1 week per INSURANCE. [facility] [phone] ?  ?  ?

## 2022-01-26 ENCOUNTER — Encounter: Payer: Self-pay | Admitting: Physical Therapy

## 2022-01-26 ENCOUNTER — Ambulatory Visit: Payer: Medicare Other | Admitting: Physical Therapy

## 2022-01-26 ENCOUNTER — Other Ambulatory Visit: Payer: Self-pay

## 2022-01-26 DIAGNOSIS — M6281 Muscle weakness (generalized): Secondary | ICD-10-CM

## 2022-01-26 DIAGNOSIS — R29818 Other symptoms and signs involving the nervous system: Secondary | ICD-10-CM | POA: Diagnosis not present

## 2022-01-26 DIAGNOSIS — R29898 Other symptoms and signs involving the musculoskeletal system: Secondary | ICD-10-CM

## 2022-01-26 DIAGNOSIS — R2689 Other abnormalities of gait and mobility: Secondary | ICD-10-CM | POA: Diagnosis not present

## 2022-01-26 DIAGNOSIS — R278 Other lack of coordination: Secondary | ICD-10-CM

## 2022-01-26 DIAGNOSIS — I69354 Hemiplegia and hemiparesis following cerebral infarction affecting left non-dominant side: Secondary | ICD-10-CM

## 2022-01-26 DIAGNOSIS — R2681 Unsteadiness on feet: Secondary | ICD-10-CM

## 2022-01-26 NOTE — Therapy (Signed)
[facility] ?[place] ?[place], [place], [address] ?Phone: [phone]   Fax:  [phone] ? ?Physical Therapy Treatment ? ?Patient Details  ?Name: [name] ?MRN: [id number] ?Date of Birth: 22-Jan-1946 ?Referring Provider (PT): Dr Letta Pate ? ? ?Encounter Date: 01/26/2022 ? ? PT End of Session - 01/26/22 1314   ? ? Visit Number 7   ? Number of Visits 16   ? Date for PT Re-Evaluation 03/01/22   ? PT Start Time 1230   ? PT Stop Time 1313   ? PT Time Calculation (min) 43 min   ? Activity Tolerance Patient tolerated treatment well   ? Behavior During Therapy [facility] for tasks assessed/performed   ? ?  ?  ? ?  ? ? ?Past Medical History:  ?Diagnosis Date  ? Abnormality of gait 09/07/2016  ? Allergy   ? Diabetes mellitus without complication ([place])   ? Patient denies this - notes history of glucose intolerance  ? GERD (gastroesophageal reflux disease)   ? Hemiparesis and alteration of sensations as late effects of stroke ([place]) 09/07/2016  ? History of pneumonia 1997  ? Hypertension   ? S/P MVR (mitral valve replacement)   ? Mechanical mitral valve replacement at age 63 (done in [place])  // echo 7/17: EF 55-60, normal wall motion, bileaflet mechanical mitral valve prosthesis functioning normally, mild LAE, mildly reduced RVSF, small pericardial effusion  ? Stroke [facility]) 1997, 2013, 2015  ? ? ?Past Surgical History:  ?Procedure Laterality Date  ? ABDOMINAL HYSTERECTOMY    ? BRAIN SURGERY    ? BUNIONECTOMY  1993  ? CARDIAC VALVE REPLACEMENT  1997  ? TOE SURGERY  1996  ? ? ?There were no vitals filed for this visit. ? ? ? ? ? ? ? ? ? ? ? ? ? ? ? ? ? ? ? ? ? [facility] Adult PT Treatment/Exercise - 01/26/22 0001   ? ?  ? Knee/Hip Exercises: Aerobic  ? Nustep L4 BLEs only x6 minutes   ?  ? Knee/Hip Exercises: Standing  ? Other Standing Knee Exercises Standing iwth RLE on 6" step, using cane in Rhand, min a. Performed L rotation and L knee mini flex/ext with ocasional min A and VC  to lift R cane off the floor and reliy on LLE.   ?  ? Knee/Hip Exercises: Seated  ? Sit to Sand 10 reps;with UE support   hands on the back of a chair turned sround in front of paitent. Therapsit facilitated forward weight shift and activation of LLE, reach back with LUE to promote LLE WB during descent.  ?  ? Knee/Hip Exercises: Supine  ? Bridges Strengthening;Both;1 set;10 reps   ? Bridges with [employer] Strengthening;Both;1 set;10 reps   legs at 10 and 2, rolled into IR.  ? Single Leg Bridge Strengthening;Left;1 set;10 reps   ? Other Supine Knee/Hip Exercises Bridge with ball roll   ? Other Supine Knee/Hip Exercises L foot pressing into Red physioball, holding knee extension without hyperextension while lifting RLE off the mat for reciprocal control, 4 reps, min TC needed to L LE.   ? ?  ?  ? ?  ? ? ? ? ? ? ? ? ? ? ? ? PT Short Term Goals - 01/26/22 1314   ? ?  ? PT SHORT TERM GOAL #1  ? Title I with basic HEP   ? Baseline Initiated   ? Time 3   ? Period Weeks   ?  Status Achieved   ? Target Date 02/01/22   ? ?  ?  ? ?  ? ? ? ? PT Long Term Goals - 01/04/22 1851   ? ?  ? PT LONG TERM GOAL #1  ? Title I with final HEP   ? Time 8   ? Period Weeks   ? Status New   ? Target Date 03/01/22   ?  ? PT LONG TERM GOAL #2  ? Title Pt will improve TUG score to less than or equal to 13 seconds or less for pt to not be considered fall risk.   ? Baseline 23.96   ? Time 8   ? Period Weeks   ? Status New   ? Target Date 03/01/22   ?  ? PT LONG TERM GOAL #3  ? Title Improve 5 x STS time to <12 sec to demosntrate improved BLE strength.   ? Baseline 31.1-22" surface, no hands   ? Time 8   ? Period Weeks   ? Status New   ? Target Date 03/01/22   ?  ? PT LONG TERM GOAL #4  ? Title Patient will score at least 46/56 on BERG Balance test to demosntrate decreased fall risk.   ? Baseline TBD   ? Time 8   ? Period Weeks   ? Status New   ? Target Date 03/01/22   ?  ? PT LONG TERM GOAL #5  ? Title Patient will walk at least 200', on level  and unlevel surface, with LRAD, MI.   ? Baseline 100' with cane and S.   ? Time 8   ? Period Weeks   ? Status New   ? Target Date 03/01/22   ? ?  ?  ? ?  ? ? ? ? ? ? ? ? Plan - 01/26/22 1319   ? ? Clinical Impression Statement Patient reports no new problems. Treatment focused on activation of LLE, WB and standing. She participated well. She reports very poor sensory feedback from LLE, which impedes her ability to activate and rely on the limb, but participated well.   ? Personal Factors and Comorbidities Comorbidity 3+   ? 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 Examination-Activity Limitations Locomotion Level;Transfers;Stairs;Stand   ? Examination-Participation Restrictions Community Activity;Other   ? Stability/Clinical Decision Making Stable/Uncomplicated   ? Rehab Potential Good   ? PT Frequency 2x / week   ? PT Duration 6 weeks   ? PT Treatment/Interventions ADLs/Self Care Home Management;DME Instruction;Gait training;Stair training;Functional mobility training;Therapeutic activities;Therapeutic exercise;Balance training;Neuromuscular re-education;Patient/family education;Passive range of motion;Orthotic Fit/Training;Electrical Stimulation;Moist Heat;Iontophoresis '4mg'$ /ml Dexamethasone;Dry needling;Manual techniques   ? PT Next Visit Plan strength, balance, endurance, gait training, motor control   ? PT Home Exercise Plan Mini squats   ? Consulted and Agree with Plan of Care Patient   ? ?  ?  ? ?  ? ? ?Patient will benefit from skilled therapeutic intervention in order to improve the following deficits and impairments:  Abnormal gait, Difficulty walking, Decreased range of motion, Impaired tone, Pain, Decreased balance, Decreased mobility, Decreased strength, Postural dysfunction, Improper body mechanics, Increased muscle spasms ? ?Visit Diagnosis: ?Hemiplegia and hemiparesis following cerebral infarction affecting left non-dominant side ([place]) ? ?Other lack of  coordination ? ?Other symptoms and signs involving the nervous system ? ?Other symptoms and signs involving the musculoskeletal system ? ?Other abnormalities of gait and mobility ? ?Muscle weakness (generalized) ? ?Unsteadiness  on feet ? ? ? ? ?Problem List ?Patient Active Problem List  ? Diagnosis Date Noted  ? Goals of care, counseling/discussion   ? Palliative care by specialist   ? Acute CVA (cerebrovascular accident) ([place]) 07/01/2020  ? 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 Anticoagulated on warfarin   ? Left hemiparesis ([place])   ? History of CVA with residual deficit   ? Thrombocytopenia ([place])   ? Prediabetes   ? CVA (cerebral vascular accident) ([place]) 09/03/2019  ? Vertigo 09/24/2017  ? Benign paroxysmal positional vertigo   ? History of stroke   ? Hyperlipemia 08/23/2017  ? Encounter for therapeutic drug monitoring 08/08/2017  ? Long term (current) use of anticoagulants [Z79.01] 07/12/2017  ? Hemiparesis and alteration of sensations as late effects of stroke ([place]) 09/07/2016  ? Abnormality of gait 09/07/2016  ? HTN (hypertension) 04/13/2016  ? H/O mitral valve replacement with mechanical valve 10/16/2015  ? Diabetes mellitus without complication ([place])   ? Hemiparesis (L sided - mild) due to old stroke [facility])   ? ? ?Marcelina Morel, DPT ?01/26/2022, 1:24 PM ? ?[facility] ?[place] ?[address] ?[place], [place], [address] ?Phone: [phone]   Fax:  [phone] ? ?Name: TALITA RECHT ?MRN: [id number] ?Date of Birth: 09-27-1946 ? ? ? ?

## 2022-01-28 ENCOUNTER — Ambulatory Visit: Payer: Medicare Other | Admitting: Physical Therapy

## 2022-02-01 ENCOUNTER — Ambulatory Visit (INDEPENDENT_AMBULATORY_CARE_PROVIDER_SITE_OTHER): Payer: Medicare Other

## 2022-02-01 DIAGNOSIS — Z5181 Encounter for therapeutic drug level monitoring: Secondary | ICD-10-CM | POA: Diagnosis not present

## 2022-02-01 DIAGNOSIS — Z952 Presence of prosthetic heart valve: Secondary | ICD-10-CM

## 2022-02-01 DIAGNOSIS — Z1283 Encounter for screening for malignant neoplasm of skin: Secondary | ICD-10-CM | POA: Diagnosis not present

## 2022-02-01 DIAGNOSIS — I69359 Hemiplegia and hemiparesis following cerebral infarction affecting unspecified side: Secondary | ICD-10-CM | POA: Diagnosis not present

## 2022-02-01 DIAGNOSIS — L821 Other seborrheic keratosis: Secondary | ICD-10-CM | POA: Diagnosis not present

## 2022-02-01 DIAGNOSIS — B078 Other viral warts: Secondary | ICD-10-CM | POA: Diagnosis not present

## 2022-02-01 DIAGNOSIS — Z7901 Long term (current) use of anticoagulants: Secondary | ICD-10-CM

## 2022-02-01 LAB — POCT INR: INR: 3.2 — AB (ref 2.0–3.0)

## 2022-02-01 NOTE — Patient Instructions (Signed)
Description   ?Spoke with pt and instructed to continue taking warfarin '5mg'$  daily except 7.'5mg'$  on Mondays. Pt eats broccoli on Fridays. Recheck INR in 1 week per INSURANCE. Coumadin Clinic [phone] ?  ?  ?

## 2022-02-02 ENCOUNTER — Encounter: Payer: Self-pay | Admitting: Physical Therapy

## 2022-02-02 ENCOUNTER — Other Ambulatory Visit: Payer: Self-pay

## 2022-02-02 ENCOUNTER — Ambulatory Visit: Payer: Medicare Other | Admitting: Physical Therapy

## 2022-02-02 DIAGNOSIS — I69354 Hemiplegia and hemiparesis following cerebral infarction affecting left non-dominant side: Secondary | ICD-10-CM

## 2022-02-02 DIAGNOSIS — R2681 Unsteadiness on feet: Secondary | ICD-10-CM

## 2022-02-02 DIAGNOSIS — M6281 Muscle weakness (generalized): Secondary | ICD-10-CM

## 2022-02-02 DIAGNOSIS — R29898 Other symptoms and signs involving the musculoskeletal system: Secondary | ICD-10-CM | POA: Diagnosis not present

## 2022-02-02 DIAGNOSIS — R29818 Other symptoms and signs involving the nervous system: Secondary | ICD-10-CM | POA: Diagnosis not present

## 2022-02-02 DIAGNOSIS — R278 Other lack of coordination: Secondary | ICD-10-CM

## 2022-02-02 DIAGNOSIS — R270 Ataxia, unspecified: Secondary | ICD-10-CM

## 2022-02-02 DIAGNOSIS — R2689 Other abnormalities of gait and mobility: Secondary | ICD-10-CM

## 2022-02-02 NOTE — Therapy (Signed)
[facility] ?[place] [address] ?[place], [place], [address] ?Phone: [phone]   Fax:  [phone] ? ?Physical Therapy Treatment ? ?Patient Details  ?Name: [name] ?MRN: [id number] ?Date of Birth: 06-15-1946 ?Referring Provider (PT): Dr Letta Pate ? ? ?Encounter Date: 02/02/2022 ? ? PT End of Session - 02/02/22 1359   ? ? Visit Number 8   ? Number of Visits 16   ? Date for PT Re-Evaluation 03/01/22   ? PT Start Time 1231   ? PT Stop Time 1312   ? PT Time Calculation (min) 41 min   ? Activity Tolerance Patient tolerated treatment well   ? Behavior During Therapy [facility] for tasks assessed/performed   ? ?  ?  ? ?  ? ? ?Past Medical History:  ?Diagnosis Date  ? Abnormality of gait 09/07/2016  ? Allergy   ? Diabetes mellitus without complication ([place])   ? Patient denies this - notes history of glucose intolerance  ? GERD (gastroesophageal reflux disease)   ? Hemiparesis and alteration of sensations as late effects of stroke ([place]) 09/07/2016  ? History of pneumonia 1997  ? Hypertension   ? S/P MVR (mitral valve replacement)   ? Mechanical mitral valve replacement at age 49 (done in [place])  // echo 7/17: EF 55-60, normal wall motion, bileaflet mechanical mitral valve prosthesis functioning normally, mild LAE, mildly reduced RVSF, small pericardial effusion  ? Stroke [facility]) 1997, 2013, 2015  ? ? ?Past Surgical History:  ?Procedure Laterality Date  ? ABDOMINAL HYSTERECTOMY    ? BRAIN SURGERY    ? BUNIONECTOMY  1993  ? CARDIAC VALVE REPLACEMENT  1997  ? TOE SURGERY  1996  ? ? ?There were no vitals filed for this visit. ? ? Subjective Assessment - 02/02/22 1238   ? ? Subjective Back hurts every day. L upper/lateral hip was sore yesterday, but improved today.   ? Currently in Pain? No/denies   ? ?  ?  ? ?  ? ? ? ? ? ? ? ? ? ? ? ? ? ? ? ? ? ? ? ? [facility] Adult PT Treatment/Exercise - 02/02/22 0001   ? ?  ? Ambulation/Gait  ? Gait Comments Patient ambulated with cane after treatment,  demosntrated good foot clearance on L.   ?  ? Knee/Hip Exercises: Stretches  ? Active Hamstring Stretch Both;1 rep;30 seconds   ? ITB Stretch Both;1 rep;30 seconds   ? Piriformis Stretch Both;1 rep;30 seconds   ?  ? Knee/Hip Exercises: Aerobic  ? Nustep L4 BLEs only x6 minutes   ?  ? Knee/Hip Exercises: Machines for Strengthening  ? Total Gym Leg Press 20#, LLE only, Push through final 25% knee and hip extension. Therpasit facilitated knee control to try to prevent hyperextension. 10 reps, occasional min a.   ? ?  ?  ? ?  ? ? ? ? ? ? ? ? ? ? ? ? PT Short Term Goals - 01/26/22 1314   ? ?  ? PT SHORT TERM GOAL #1  ? Title I with basic HEP   ? Baseline Initiated   ? Time 3   ? Period Weeks   ? Status Achieved   ? Target Date 02/01/22   ? ?  ?  ? ?  ? ? ? ? PT Long Term Goals - 01/04/22 1851   ? ?  ? PT LONG TERM GOAL #1  ? Title I with final HEP   ?  Time 8   ? Period Weeks   ? Status New   ? Target Date 03/01/22   ?  ? PT LONG TERM GOAL #2  ? Title Pt will improve TUG score to less than or equal to 13 seconds or less for pt to not be considered fall risk.   ? Baseline 23.96   ? Time 8   ? Period Weeks   ? Status New   ? Target Date 03/01/22   ?  ? PT LONG TERM GOAL #3  ? Title Improve 5 x STS time to <12 sec to demosntrate improved BLE strength.   ? Baseline 31.1-22" surface, no hands   ? Time 8   ? Period Weeks   ? Status New   ? Target Date 03/01/22   ?  ? PT LONG TERM GOAL #4  ? Title Patient will score at least 46/56 on BERG Balance test to demosntrate decreased fall risk.   ? Baseline TBD   ? Time 8   ? Period Weeks   ? Status New   ? Target Date 03/01/22   ?  ? PT LONG TERM GOAL #5  ? Title Patient will walk at least 200', on level and unlevel surface, with LRAD, MI.   ? Baseline 100' with cane and S.   ? Time 8   ? Period Weeks   ? Status New   ? Target Date 03/01/22   ? ?  ?  ? ?  ? ? ? ? ? ? ? ? Plan - 02/02/22 1400   ? ? Clinical Impression Statement Patient reports she is in her usual state of health. she  did report tansient L lateral hip pain. Performed some stretching fo rthe hip, F/B strengthenign to LLE and trunk to improve cotnrol, balance, stability in stand. She tolerated all activities well, reported leg fatigue at the end of treatment.   ? Personal Factors and Comorbidities Comorbidity 3+   ? 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 Examination-Activity Limitations Locomotion Level;Transfers;Stairs;Stand   ? Examination-Participation Restrictions Community Activity;Other   ? Stability/Clinical Decision Making Stable/Uncomplicated   ? Clinical Decision Making Moderate   ? Rehab Potential Good   ? PT Frequency 2x / week   ? PT Duration 6 weeks   ? PT Treatment/Interventions ADLs/Self Care Home Management;DME Instruction;Gait training;Stair training;Functional mobility training;Therapeutic activities;Therapeutic exercise;Balance training;Neuromuscular re-education;Patient/family education;Passive range of motion;Orthotic Fit/Training;Electrical Stimulation;Moist Heat;Iontophoresis '4mg'$ /ml Dexamethasone;Dry needling;Manual techniques   ? PT Next Visit Plan Re-assess   ? PT Home Exercise Plan Mini squats   ? Consulted and Agree with Plan of Care Patient   ? ?  ?  ? ?  ? ? ?Patient will benefit from skilled therapeutic intervention in order to improve the following deficits and impairments:  Abnormal gait, Difficulty walking, Decreased range of motion, Impaired tone, Pain, Decreased balance, Decreased mobility, Decreased strength, Postural dysfunction, Improper body mechanics, Increased muscle spasms ? ?Visit Diagnosis: ?Hemiplegia and hemiparesis following cerebral infarction affecting left non-dominant side ([place]) ? ?Other lack of coordination ? ?Other symptoms and signs involving the nervous system ? ?Other symptoms and signs involving the musculoskeletal system ? ?Other abnormalities of gait and mobility ? ?Muscle weakness (generalized) ? ?Unsteadiness on  feet ? ?Ataxia ? ? ? ? ?Problem List ?Patient Active Problem List  ? Diagnosis Date Noted  ? Goals of care, counseling/discussion   ? Palliative care by specialist   ? Acute CVA (cerebrovascular  accident) ([place]) 07/01/2020  ? 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 Anticoagulated on warfarin   ? Left hemiparesis ([place])   ? History of CVA with residual deficit   ? Thrombocytopenia ([place])   ? Prediabetes   ? CVA (cerebral vascular accident) ([place]) 09/03/2019  ? Vertigo 09/24/2017  ? Benign paroxysmal positional vertigo   ? History of stroke   ? Hyperlipemia 08/23/2017  ? Encounter for therapeutic drug monitoring 08/08/2017  ? Long term (current) use of anticoagulants [Z79.01] 07/12/2017  ? Hemiparesis and alteration of sensations as late effects of stroke ([place]) 09/07/2016  ? Abnormality of gait 09/07/2016  ? HTN (hypertension) 04/13/2016  ? H/O mitral valve replacement with mechanical valve 10/16/2015  ? Diabetes mellitus without complication ([place])   ? Hemiparesis (L sided - mild) due to old stroke [facility])   ? ? ?Marcelina Morel, DPT ?02/02/2022, 2:04 PM ? ?[facility] ?[place] [address] ?[place], [place], [address] ?Phone: [phone]   Fax:  [phone] ? ?Name: [name] ?MRN: [id number] ?Date of Birth: 04/22/1946 ? ? ? ?

## 2022-02-04 ENCOUNTER — Encounter: Payer: Self-pay | Admitting: Physical Therapy

## 2022-02-04 ENCOUNTER — Ambulatory Visit: Payer: Medicare Other | Admitting: Physical Therapy

## 2022-02-04 DIAGNOSIS — I69354 Hemiplegia and hemiparesis following cerebral infarction affecting left non-dominant side: Secondary | ICD-10-CM

## 2022-02-04 DIAGNOSIS — R29898 Other symptoms and signs involving the musculoskeletal system: Secondary | ICD-10-CM

## 2022-02-04 DIAGNOSIS — R29818 Other symptoms and signs involving the nervous system: Secondary | ICD-10-CM | POA: Diagnosis not present

## 2022-02-04 DIAGNOSIS — R278 Other lack of coordination: Secondary | ICD-10-CM

## 2022-02-04 DIAGNOSIS — R2689 Other abnormalities of gait and mobility: Secondary | ICD-10-CM | POA: Diagnosis not present

## 2022-02-04 DIAGNOSIS — M6281 Muscle weakness (generalized): Secondary | ICD-10-CM | POA: Diagnosis not present

## 2022-02-04 DIAGNOSIS — R270 Ataxia, unspecified: Secondary | ICD-10-CM

## 2022-02-04 DIAGNOSIS — R2681 Unsteadiness on feet: Secondary | ICD-10-CM

## 2022-02-04 NOTE — Therapy (Signed)
[facility] ?[place] ?[address] ?[place], [place], [address] ?Phone: [phone]   Fax:  [phone] ? ?Physical Therapy Treatment ? ?Patient Details  ?Name: [name] ?MRN: [id number] ?Date of Birth: [birth year] ?Referring Provider (PT): Dr Letta Pate ? ? ?Encounter Date: 02/04/2022 ? ? PT End of Session - 02/04/22 1353   ? ? Visit Number 9   ? Date for PT Re-Evaluation 03/01/22   ? PT Start Time 1310   ? PT Stop Time 1355   ? PT Time Calculation (min) 45 min   ? Activity Tolerance Patient tolerated treatment well   ? Behavior During Therapy [facility] for tasks assessed/performed   ? ?  ?  ? ?  ? ? ?Past Medical History:  ?Diagnosis Date  ? Abnormality of gait 09/07/2016  ? Allergy   ? Diabetes mellitus without complication ([place])   ? Patient denies this - notes history of glucose intolerance  ? GERD (gastroesophageal reflux disease)   ? Hemiparesis and alteration of sensations as late effects of stroke ([place]) 09/07/2016  ? History of pneumonia 1997  ? Hypertension   ? S/P MVR (mitral valve replacement)   ? Mechanical mitral valve replacement at age 51 (done in [place])  // echo 7/17: EF 55-60, normal wall motion, bileaflet mechanical mitral valve prosthesis functioning normally, mild LAE, mildly reduced RVSF, small pericardial effusion  ? Stroke [facility]) 1997, 2013, 2015  ? ? ?Past Surgical History:  ?Procedure Laterality Date  ? ABDOMINAL HYSTERECTOMY    ? BRAIN SURGERY    ? BUNIONECTOMY  1993  ? CARDIAC VALVE REPLACEMENT  1997  ? TOE SURGERY  1996  ? ? ?There were no vitals filed for this visit. ? ? Subjective Assessment - 02/04/22 1308   ? ? Subjective Patient reports that her back and her L leg were very sore after last treatment. Her back is always a problem and usually takes a few hours to warm up each morning.   ? ?  ?  ? ?  ? ? ? ? ? [facility] PT Assessment - 02/04/22 0001   ? ?  ? Transfers  ? Five time sit to stand comments  22.25   ?  ? Standardized Balance Assessment  ?  Five times sit to stand comments  22.25   ?  ? Berg Balance Test  ? Sit to Stand Able to stand  independently using hands   ? Standing Unsupported Able to stand safely 2 minutes   ? Sitting with Back Unsupported but Feet Supported on Floor or Stool Able to sit safely and securely 2 minutes   ? Stand to Sit Sits safely with minimal use of hands   ? Transfers Able to transfer safely, definite need of hands   ? Standing Unsupported with Eyes Closed Able to stand 10 seconds safely   ? Standing Unsupported with Feet Together Able to place feet together independently and stand for 1 minute with supervision   ? From Standing, Reach Forward with Outstretched Arm Can reach forward >12 cm safely (5")   ? From Standing Position, Pick up Object from Floor Able to pick up shoe, needs supervision   ? From Standing Position, Turn to Look Behind Over each Shoulder Looks behind one side only/other side shows less weight shift   ? Turn 360 Degrees Able to turn 360 degrees safely but slowly   ? Standing Unsupported, Alternately Place Feet on Step/Stool Able to complete 4 steps without aid or  supervision   ? Standing Unsupported, One Foot in Front Able to take small step independently and hold 30 seconds   ? Standing on One Leg Able to lift leg independently and hold 5-10 seconds   ? Total Score 43   ?  ? Timed Up and Go Test  ? Normal TUG (seconds) 24.2   ? ?  ?  ? ?  ? ? ? ? ? ? ? ? ? ? ? ? ? ? ? ? ? ? ? ? ? ? ? ? ? ? ? PT Short Term Goals - 01/26/22 1314   ? ?  ? PT SHORT TERM GOAL #1  ? Title I with basic HEP   ? Baseline Initiated   ? Time 3   ? Period Weeks   ? Status Achieved   ? Target Date 02/01/22   ? ?  ?  ? ?  ? ? ? ? PT Long Term Goals - 02/04/22 1320   ? ?  ? PT LONG TERM GOAL #1  ? Title I with final HEP   ? Status On-going   ?  ? PT LONG TERM GOAL #2  ? Title Pt will improve TUG score to less than or equal to 13 seconds or less for pt to not be considered fall risk.   ? Baseline 24.2   ? Status On-going   ? Target Date  03/01/22   ?  ? PT LONG TERM GOAL #3  ? Title Improve 5 x STS time to <12 sec to demosntrate improved BLE strength.   ? Baseline 22.25, slighty elevated surface   ? Status On-going   ?  ? PT LONG TERM GOAL #4  ? Title Patient will score at least 46/56 on BERG Balance test to demosntrate decreased fall risk.   ? Baseline 43/56   ? Status On-going   ? Target Date 03/01/22   ?  ? PT LONG TERM GOAL #5  ? Title Patient will walk at least 200', on level and unlevel surface, with LRAD, MI.   ? Baseline 150', cane   ? Status On-going   ? Target Date 03/01/22   ? ?  ?  ? ?  ? ? ? ? ? ? ? ? Plan - 02/04/22 1335   ? ? Clinical Impression Statement Patient reports that her back and leg were more sore after last treatment. She does have an extensive HEP that she follows in the morning for strengthening and stretching of limbs and back. Re-assessed functional status.   ? 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 Examination-Participation Restrictions Community Activity;Other   ? Stability/Clinical Decision Making Stable/Uncomplicated   ? Clinical Decision Making Moderate   ? Rehab Potential Good   ? PT Frequency 2x / week   ? PT Duration 6 weeks   ? PT Treatment/Interventions ADLs/Self Care Home Management;DME Instruction;Gait training;Stair training;Functional mobility training;Therapeutic activities;Therapeutic exercise;Balance training;Neuromuscular re-education;Patient/family education;Passive range of motion;Orthotic Fit/Training;Electrical Stimulation;Moist Heat;Iontophoresis '4mg'$ /ml Dexamethasone;Dry needling;Manual techniques   ? PT Next Visit Plan Standing balance activities.   ? Consulted and Agree with Plan of Care Patient   ? ?  ?  ? ?  ? ? ?Patient will benefit from skilled therapeutic intervention in order to improve the following deficits and impairments:  Abnormal gait, Difficulty walking, Decreased range of motion, Impaired tone, Pain, Decreased balance, Decreased  mobility, Decreased strength, Postural dysfunction, Improper body mechanics, Increased muscle spasms ? ?Visit  Diagnosis: ?Hemiplegia and hemiparesis following cerebral infarction affecting left non-dominant side ([place]) ? ?Other lack of coordination ? ?Other symptoms and signs involving the nervous system ? ?Other symptoms and signs involving the musculoskeletal system ? ?Other abnormalities of gait and mobility ? ?Muscle weakness (generalized) ? ?Unsteadiness on feet ? ?Ataxia ? ? ? ? ?Problem List ?Patient Active Problem List  ? Diagnosis Date Noted  ? Goals of care, counseling/discussion   ? Palliative care by specialist   ? Acute CVA (cerebrovascular accident) ([place]) 07/01/2020  ? 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 Anticoagulated on warfarin   ? Left hemiparesis ([place])   ? History of CVA with residual deficit   ? Thrombocytopenia ([place])   ? Prediabetes   ? CVA (cerebral vascular accident) ([place]) 09/03/2019  ? Vertigo 09/24/2017  ? Benign paroxysmal positional vertigo   ? History of stroke   ? Hyperlipemia 08/23/2017  ? Encounter for therapeutic drug monitoring 08/08/2017  ? Long term (current) use of anticoagulants [Z79.01] 07/12/2017  ? Hemiparesis and alteration of sensations as late effects of stroke ([place]) 09/07/2016  ? Abnormality of gait 09/07/2016  ? HTN (hypertension) 04/13/2016  ? H/O mitral valve replacement with mechanical valve 10/16/2015  ? Diabetes mellitus without complication ([place])   ? Hemiparesis (L sided - mild) due to old stroke [facility])   ? ? ?Marcelina Morel, DPT ?02/04/2022, 2:00 PM ? ?[facility] ?[place] ?[address] ?[place], [place], [address] ?Phone: [phone]   Fax:  [phone] ? ?Name: [name] ?MRN: [id number] ?Date of Birth: 1946-09-16 ? ? ? ?

## 2022-02-08 ENCOUNTER — Ambulatory Visit (INDEPENDENT_AMBULATORY_CARE_PROVIDER_SITE_OTHER): Payer: Medicare Other

## 2022-02-08 DIAGNOSIS — I69359 Hemiplegia and hemiparesis following cerebral infarction affecting unspecified side: Secondary | ICD-10-CM

## 2022-02-08 DIAGNOSIS — Z7901 Long term (current) use of anticoagulants: Secondary | ICD-10-CM | POA: Diagnosis not present

## 2022-02-08 DIAGNOSIS — Z952 Presence of prosthetic heart valve: Secondary | ICD-10-CM | POA: Diagnosis not present

## 2022-02-08 DIAGNOSIS — Z5181 Encounter for therapeutic drug level monitoring: Secondary | ICD-10-CM

## 2022-02-08 LAB — POCT INR: INR: 2.8 (ref 2.0–3.0)

## 2022-02-08 NOTE — Patient Instructions (Signed)
Description   ?Spoke with pt and instructed her to take 2 tablets today, then continue taking warfarin '5mg'$  daily except 7.'5mg'$  on Mondays. Pt eats broccoli on Fridays. Recheck INR in 1 week per INSURANCE. Coumadin Clinic [phone] ?  ?  ?

## 2022-02-11 ENCOUNTER — Ambulatory Visit: Payer: Medicare Other | Attending: Physical Medicine & Rehabilitation | Admitting: Physical Therapy

## 2022-02-11 ENCOUNTER — Encounter: Payer: Self-pay | Admitting: Physical Therapy

## 2022-02-11 DIAGNOSIS — R270 Ataxia, unspecified: Secondary | ICD-10-CM | POA: Insufficient documentation

## 2022-02-11 DIAGNOSIS — R2681 Unsteadiness on feet: Secondary | ICD-10-CM | POA: Insufficient documentation

## 2022-02-11 DIAGNOSIS — R2689 Other abnormalities of gait and mobility: Secondary | ICD-10-CM | POA: Diagnosis not present

## 2022-02-11 DIAGNOSIS — R29818 Other symptoms and signs involving the nervous system: Secondary | ICD-10-CM | POA: Insufficient documentation

## 2022-02-11 DIAGNOSIS — I69354 Hemiplegia and hemiparesis following cerebral infarction affecting left non-dominant side: Secondary | ICD-10-CM | POA: Diagnosis not present

## 2022-02-11 DIAGNOSIS — R278 Other lack of coordination: Secondary | ICD-10-CM | POA: Diagnosis not present

## 2022-02-11 DIAGNOSIS — M6281 Muscle weakness (generalized): Secondary | ICD-10-CM | POA: Diagnosis not present

## 2022-02-11 DIAGNOSIS — R29898 Other symptoms and signs involving the musculoskeletal system: Secondary | ICD-10-CM | POA: Insufficient documentation

## 2022-02-11 NOTE — Therapy (Signed)
[place] ?[place] ?[address] ?[place], [place], [address] ?Phone: [phone]   Fax:  [phone] ? ?Physical Therapy Treatment ?Progress Note ?Reporting Period 01/04/22 to 02/11/22 ? ?See note below for Objective Data and Assessment of Progress/Goals.  ? ?  ?Patient Details  ?Name: [name] ?MRN: [id number] ?Date of Birth: 24-Oct-1946 ?Referring Provider (PT): Dr Letta Pate ? ? ?Encounter Date: 02/11/2022 ? ? PT End of Session - 02/11/22 1530   ? ? Visit Number 10   ? PT Start Time 1440   ? PT Stop Time 1525   ? PT Time Calculation (min) 45 min   ? Activity Tolerance Patient tolerated treatment well   ? Behavior During Therapy [facility] for tasks assessed/performed   ? ?  ?  ? ?  ? ? ?Past Medical History:  ?Diagnosis Date  ? Abnormality of gait 09/07/2016  ? Allergy   ? Diabetes mellitus without complication ([place])   ? Patient denies this - notes history of glucose intolerance  ? GERD (gastroesophageal reflux disease)   ? Hemiparesis and alteration of sensations as late effects of stroke ([place]) 09/07/2016  ? History of pneumonia 1997  ? Hypertension   ? S/P MVR (mitral valve replacement)   ? Mechanical mitral valve replacement at age 65 (done in [place])  // echo 7/17: EF 55-60, normal wall motion, bileaflet mechanical mitral valve prosthesis functioning normally, mild LAE, mildly reduced RVSF, small pericardial effusion  ? Stroke [facility]) 1997, 2013, 2015  ? ? ?Past Surgical History:  ?Procedure Laterality Date  ? ABDOMINAL HYSTERECTOMY    ? BRAIN SURGERY    ? BUNIONECTOMY  1993  ? CARDIAC VALVE REPLACEMENT  1997  ? TOE SURGERY  1996  ? ? ?There were no vitals filed for this visit. ? ? Subjective Assessment - 02/11/22 1437   ? ? Subjective Patient reports her back feels ok. Her L leg pain is also improved.   ? Currently in Pain? No/denies   ? ?  ?  ? ?  ? ? ? ? ? ? ? ? ? ? ? ? ? ? ? ? ? ? ? ? [place] Adult PT Treatment/Exercise - 02/11/22 0001   ? ?  ? Ambulation/Gait  ? Gait  Comments Patient ambulated outdoors x 300' with cane and S. Able to clear her L foot in swing, decreaesd step length, decreased stance time on L.   ?  ? Knee/Hip Exercises: Standing  ? Lateral Step Up Left;1 set;Hand Hold: 2;Step Height: 4"   ? Lateral Step Up Limitations Patient had much difficulty stepping up to the side, very fearful and had difficulty weight shifting onto LLE to step.   ? Forward Step Up Both;10 reps;Hand Hold: 1;Step Height: 6"   ? Other Standing Knee Exercises Side stepping R/L over a line on the floor with cane in R hand. Demosntrated good control and balance throughout, no unsteadiness.   ? Other Standing Knee Exercises Walked on floor mat to challenge balance, using cane and CGA, slow, but stable, including turns.   ? ?  ?  ? ?  ? ? ? ? ? ? ? ? ? ? ? ? PT Short Term Goals - 01/26/22 1314   ? ?  ? PT SHORT TERM GOAL #1  ? Title I with basic HEP   ? Baseline Initiated   ? Time 3   ? Period Weeks   ? Status Achieved   ? Target Date 02/01/22   ? ?  ?  ? ?  ? ? ? ?  PT Long Term Goals - 02/11/22 1623   ? ?  ? PT LONG TERM GOAL #1  ? Title I with final HEP   ? Time 3   ? Status On-going   ?  ? PT LONG TERM GOAL #2  ? Title Pt will improve TUG score to less than or equal to 13 seconds or less for pt to not be considered fall risk.   ? Baseline 24.2   ? Status On-going   ? Target Date 03/01/22   ?  ? PT LONG TERM GOAL #3  ? Title Improve 5 x STS time to <12 sec to demosntrate improved BLE strength.   ? Baseline 22.25, slighty elevated surface   ? Status On-going   ?  ? PT LONG TERM GOAL #4  ? Title Patient will score at least 46/56 on BERG Balance test to demosntrate decreased fall risk.   ? Baseline 43/56   ? Status On-going   ? Target Date 03/01/22   ?  ? PT LONG TERM GOAL #5  ? Title Patient will walk at least 200', on level and unlevel surface, with LRAD, MI.   ? Baseline 200' with cane on unlevel surfaces, occ CGA   ? Time 3   ? Status On-going   ? Target Date 03/04/22   ? ?  ?  ? ?   ? ? ? ? ? ? ? ? Plan - 02/11/22 1620   ? ? Clinical Impression Statement Patient reports improvement in leg and back pain. She is still fearful when walking especially outdoors and in the wind. Treatment focused on gait as well as continuing to challenge LLE in open and closed chain activities. She has poor sensation in the LLE which impedes her confidence at times. She is progressing toward LTG and will benefit from continuing therapy with current POC.   ? Personal Factors and Comorbidities Comorbidity 3+   ? 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 Examination-Activity Limitations Locomotion Level;Transfers;Stairs;Stand   ? Examination-Participation Restrictions Community Activity;Other   ? Stability/Clinical Decision Making Stable/Uncomplicated   ? Clinical Decision Making Moderate   ? Rehab Potential Good   ? PT Frequency 2x / week   ? PT Duration 3 weeks   ? PT Treatment/Interventions ADLs/Self Care Home Management;DME Instruction;Gait training;Stair training;Functional mobility training;Therapeutic activities;Therapeutic exercise;Balance training;Neuromuscular re-education;Patient/family education;Passive range of motion;Orthotic Fit/Training;Electrical Stimulation;Moist Heat;Iontophoresis '4mg'$ /ml Dexamethasone;Dry needling;Manual techniques   ? PT Next Visit Plan Standing balance activities.   ? Consulted and Agree with Plan of Care Patient   ? ?  ?  ? ?  ? ? ?Patient will benefit from skilled therapeutic intervention in order to improve the following deficits and impairments:  Abnormal gait, Difficulty walking, Decreased range of motion, Impaired tone, Pain, Decreased balance, Decreased mobility, Decreased strength, Postural dysfunction, Improper body mechanics, Increased muscle spasms ? ?Visit Diagnosis: ?Hemiplegia and hemiparesis following cerebral infarction affecting left non-dominant side ([place]) ? ?Other lack of coordination ? ?Other symptoms and signs  involving the nervous system ? ?Other symptoms and signs involving the musculoskeletal system ? ?Other abnormalities of gait and mobility ? ?Muscle weakness (generalized) ? ?Unsteadiness on feet ? ?Ataxia ? ? ? ? ?Problem List ?Patient Active Problem List  ? Diagnosis Date Noted  ? Goals of care, counseling/discussion   ? Palliative care by specialist   ? Acute CVA (cerebrovascular accident) ([place]) 07/01/2020  ? 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 Anticoagulated on warfarin   ?  Left hemiparesis ([place])   ? History of CVA with residual deficit   ? Thrombocytopenia ([place])   ? Prediabetes   ? CVA (cerebral vascular accident) ([place]) 09/03/2019  ? Vertigo 09/24/2017  ? Benign paroxysmal positional vertigo   ? History of stroke   ? Hyperlipemia 08/23/2017  ? Encounter for therapeutic drug monitoring 08/08/2017  ? Long term (current) use of anticoagulants [Z79.01] 07/12/2017  ? Hemiparesis and alteration of sensations as late effects of stroke ([place]) 09/07/2016  ? Abnormality of gait 09/07/2016  ? HTN (hypertension) 04/13/2016  ? H/O mitral valve replacement with mechanical valve 10/16/2015  ? Diabetes mellitus without complication ([place])   ? Hemiparesis (L sided - mild) due to old stroke [facility])   ? ? ?Marcelina Morel, DPT ?02/11/2022, 4:26 PM ? ?[place] ?[place] ?[address] ?[place], [place], [address] ?Phone: [phone]   Fax:  [phone] ? ?Name: [name] ?MRN: [id number] ?Date of Birth: 05-07-1946 ? ? ? ?

## 2022-02-15 ENCOUNTER — Ambulatory Visit (INDEPENDENT_AMBULATORY_CARE_PROVIDER_SITE_OTHER): Payer: Medicare Other

## 2022-02-15 DIAGNOSIS — Z5181 Encounter for therapeutic drug level monitoring: Secondary | ICD-10-CM

## 2022-02-15 DIAGNOSIS — Z7901 Long term (current) use of anticoagulants: Secondary | ICD-10-CM | POA: Diagnosis not present

## 2022-02-15 LAB — POCT INR: INR: 3.2 — AB (ref 2.0–3.0)

## 2022-02-15 NOTE — Patient Instructions (Signed)
Description   ?Continue taking warfarin '5mg'$  daily except 7.'5mg'$  on Mondays.  ?Pt eats broccoli on Fridays.  ?Recheck INR in 1 week per INSURANCE. [facility] [phone] ?  ?   ?

## 2022-02-18 ENCOUNTER — Encounter: Payer: Self-pay | Admitting: Physical Therapy

## 2022-02-18 ENCOUNTER — Ambulatory Visit: Payer: Medicare Other | Admitting: Physical Therapy

## 2022-02-18 DIAGNOSIS — R2681 Unsteadiness on feet: Secondary | ICD-10-CM

## 2022-02-18 DIAGNOSIS — R2689 Other abnormalities of gait and mobility: Secondary | ICD-10-CM

## 2022-02-18 DIAGNOSIS — R270 Ataxia, unspecified: Secondary | ICD-10-CM

## 2022-02-18 DIAGNOSIS — I69354 Hemiplegia and hemiparesis following cerebral infarction affecting left non-dominant side: Secondary | ICD-10-CM | POA: Diagnosis not present

## 2022-02-18 DIAGNOSIS — R29818 Other symptoms and signs involving the nervous system: Secondary | ICD-10-CM

## 2022-02-18 DIAGNOSIS — R278 Other lack of coordination: Secondary | ICD-10-CM | POA: Diagnosis not present

## 2022-02-18 DIAGNOSIS — R29898 Other symptoms and signs involving the musculoskeletal system: Secondary | ICD-10-CM

## 2022-02-18 DIAGNOSIS — M6281 Muscle weakness (generalized): Secondary | ICD-10-CM | POA: Diagnosis not present

## 2022-02-18 NOTE — Therapy (Signed)
[place] ?[place] ?[address] ?[place], [place], [address] ?Phone: [phone]   Fax:  [phone] ? ?Physical Therapy Treatment ? ?Patient Details  ?Name: [name] ?MRN: [id number] ?Date of Birth: May 21, 1946 ?Referring Provider (PT): Dr Letta Pate ? ? ?Encounter Date: 02/18/2022 ? ? PT End of Session - 02/18/22 1618   ? ? Visit Number 11   ? PT Start Time 1528   ? PT Stop Time 1615   ? PT Time Calculation (min) 47 min   ? Equipment Utilized During Treatment Gait belt   ? Activity Tolerance Patient tolerated treatment well   ? Behavior During Therapy [facility] for tasks assessed/performed   ? ?  ?  ? ?  ? ? ?Past Medical History:  ?Diagnosis Date  ? Abnormality of gait 09/07/2016  ? Allergy   ? Diabetes mellitus without complication ([place])   ? Patient denies this - notes history of glucose intolerance  ? GERD (gastroesophageal reflux disease)   ? Hemiparesis and alteration of sensations as late effects of stroke ([place]) 09/07/2016  ? History of pneumonia 1997  ? Hypertension   ? S/P MVR (mitral valve replacement)   ? Mechanical mitral valve replacement at age 62 (done in [place])  // echo 7/17: EF 55-60, normal wall motion, bileaflet mechanical mitral valve prosthesis functioning normally, mild LAE, mildly reduced RVSF, small pericardial effusion  ? Stroke [facility]) 1997, 2013, 2015  ? ? ?Past Surgical History:  ?Procedure Laterality Date  ? ABDOMINAL HYSTERECTOMY    ? BRAIN SURGERY    ? BUNIONECTOMY  1993  ? CARDIAC VALVE REPLACEMENT  1997  ? TOE SURGERY  1996  ? ? ?There were no vitals filed for this visit. ? ? Subjective Assessment - 02/18/22 1531   ? ? Subjective Patient reports an episode of dizziness on Easter morning when she sat up in bed. She waited a bit then had her husband help her to the bathroom. She felt a bit dizzy still, but then when she returned to her bed, the room was spinning and she felt nauseous. She had to lie down and slept for 3 hours. Upon waking from  her sleep, she no longer had any issues.   ? Currently in Pain? No/denies   ? ?  ?  ? ?  ? ? ? ? ? ? ? ? ? ? Vestibular Assessment - 02/18/22 0001   ? ?  ? Positional Testing  ? Dix-Hallpike Dix-Hallpike Right;Dix-Hallpike Left   ?  ? Dix-Hallpike Right  ? Dix-Hallpike Right Symptoms No nystagmus   ?  ? Dix-Hallpike Left  ? Dix-Hallpike Left Symptoms No nystagmus   ? ?  ?  ? ?  ? ? ? ? ? ? ? ? ? ? ? [place] Adult PT Treatment/Exercise - 02/18/22 0001   ? ?  ? Ambulation/Gait  ? Gait Comments Ambualted 120' while holding her cane horizontally in BUE. Improved upright posture and balance with cane.   ?  ? Neuro Re-ed   ? Neuro Re-ed Details  Patient worked on floor mat, walking in all directions iwth cane and CGA, occasionally dragging her L toes. She then performed alternating step taps on 2" step, first one tap, then progressing to 3 taps iwth each foot.   ?  ? Knee/Hip Exercises: Aerobic  ? Nustep L% x 5 minutes   ?  ? Knee/Hip Exercises: Standing  ? Other Standing Knee Exercises Performed step ups iwth LLE, stepping from floor to  second step with RLE, UUE support iwth LUE holding rail. CGA, 10 reps.   ? ?  ?  ? ?  ? ? ? ? ? ? ? ? ? ? PT Education - 02/18/22 1618   ? ? Education Details Educated to Vestibular assessment and results.   ? Person(s) Educated Patient   ? Methods Explanation   ? Comprehension Verbalized understanding   ? ?  ?  ? ?  ? ? ? PT Short Term Goals - 01/26/22 1314   ? ?  ? PT SHORT TERM GOAL #1  ? Title I with basic HEP   ? Baseline Initiated   ? Time 3   ? Period Weeks   ? Status Achieved   ? Target Date 02/01/22   ? ?  ?  ? ?  ? ? ? ? PT Long Term Goals - 02/18/22 1622   ? ?  ? PT LONG TERM GOAL #1  ? Title I with final HEP   ? Time 2   ? Period Weeks   ? Status On-going   ?  ? PT LONG TERM GOAL #2  ? Title Pt will improve TUG score to less than or equal to 13 seconds or less for pt to not be considered fall risk.   ? Baseline 24.2   ? Time 3   ? Period Weeks   ? Status On-going   ? Target Date  03/01/22   ?  ? PT LONG TERM GOAL #3  ? Title Improve 5 x STS time to <12 sec to demosntrate improved BLE strength.   ? Baseline 22.25, slighty elevated surface   ? Time 3   ? Period Weeks   ? Status On-going   ?  ? PT LONG TERM GOAL #4  ? Title Patient will score at least 46/56 on BERG Balance test to demosntrate decreased fall risk.   ? Baseline 43/56   ? Time 3   ? Period Weeks   ? Status On-going   ? Target Date 03/01/22   ?  ? PT LONG TERM GOAL #5  ? Title Patient will walk at least 200', on level and unlevel surface, with LRAD, MI.   ? Baseline 200' with cane on unlevel surfaces, occ CGA   ? Time 3   ? Period Weeks   ? Status On-going   ? Target Date 03/04/22   ? ?  ?  ? ?  ? ? ? ? ? ? ? ? Plan - 02/18/22 1619   ? ? Clinical Impression Statement Patient reports dizzy episode on Easter morning in which the room was pinning. She had a similar episode 2 years ago and was told at that time that she had some loose chrystals. Performed Dix-Hallpike assessment, (-) B. educated patien tto the results and educated her to report if it happens again and further testing could be conducted. Treatment then proceeded with BLE strength and stability, and balance training. Pateint tolerated slightly more challenging activities with improved balance noted.   ? Personal Factors and Comorbidities Comorbidity 3+   ? 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 Examination-Activity Limitations Locomotion Level;Transfers;Stairs;Stand   ? Examination-Participation Restrictions Community Activity;Other   ? Stability/Clinical Decision Making Stable/Uncomplicated   ? Clinical Decision Making Moderate   ? Rehab Potential Good   ? PT Frequency 2x / week   ? PT Duration 3 weeks   ? PT Treatment/Interventions ADLs/Self Care  Home Management;DME Instruction;Gait training;Stair training;Functional mobility training;Therapeutic activities;Therapeutic exercise;Balance training;Neuromuscular  re-education;Patient/family education;Passive range of motion;Orthotic Fit/Training;Electrical Stimulation;Moist Heat;Iontophoresis '4mg'$ /ml Dexamethasone;Dry needling;Manual techniques   ? PT Next Visit Plan Standing balance activities.   ? Consulted and Agree with Plan of Care Patient   ? ?  ?  ? ?  ? ? ?Patient will benefit from skilled therapeutic intervention in order to improve the following deficits and impairments:  Abnormal gait, Difficulty walking, Decreased range of motion, Impaired tone, Pain, Decreased balance, Decreased mobility, Decreased strength, Postural dysfunction, Improper body mechanics, Increased muscle spasms ? ?Visit Diagnosis: ?Hemiplegia and hemiparesis following cerebral infarction affecting left non-dominant side ([place]) ? ?Other lack of coordination ? ?Other symptoms and signs involving the nervous system ? ?Other symptoms and signs involving the musculoskeletal system ? ?Other abnormalities of gait and mobility ? ?Muscle weakness (generalized) ? ?Unsteadiness on feet ? ?Ataxia ? ? ? ? ?Problem List ?Patient Active Problem List  ? Diagnosis Date Noted  ? Goals of care, counseling/discussion   ? Palliative care by specialist   ? Acute CVA (cerebrovascular accident) ([place]) 07/01/2020  ? 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 Anticoagulated on warfarin   ? Left hemiparesis ([place])   ? History of CVA with residual deficit   ? Thrombocytopenia ([place])   ? Prediabetes   ? CVA (cerebral vascular accident) ([place]) 09/03/2019  ? Vertigo 09/24/2017  ? Benign paroxysmal positional vertigo   ? History of stroke   ? Hyperlipemia 08/23/2017  ? Encounter for therapeutic drug monitoring 08/08/2017  ? Long term (current) use of anticoagulants [Z79.01] 07/12/2017  ? Hemiparesis and alteration of sensations as late effects of stroke ([place]) 09/07/2016  ? Abnormality of gait 09/07/2016  ? HTN (hypertension) 04/13/2016  ? H/O mitral valve replacement with mechanical valve 10/16/2015  ? Diabetes mellitus without  complication ([place])   ? Hemiparesis (L sided - mild) due to old stroke [facility])   ? ? ?Marcelina Morel, DPT ?02/18/2022, 4:24 PM ? ?[place] ?[address] ?[address] ?[place]

## 2022-02-22 ENCOUNTER — Ambulatory Visit (INDEPENDENT_AMBULATORY_CARE_PROVIDER_SITE_OTHER): Payer: Medicare Other | Admitting: *Deleted

## 2022-02-22 DIAGNOSIS — Z7901 Long term (current) use of anticoagulants: Secondary | ICD-10-CM | POA: Diagnosis not present

## 2022-02-22 DIAGNOSIS — I69359 Hemiplegia and hemiparesis following cerebral infarction affecting unspecified side: Secondary | ICD-10-CM

## 2022-02-22 DIAGNOSIS — Z952 Presence of prosthetic heart valve: Secondary | ICD-10-CM

## 2022-02-22 DIAGNOSIS — Z5181 Encounter for therapeutic drug level monitoring: Secondary | ICD-10-CM

## 2022-02-22 LAB — POCT INR: INR: 3.2 — AB (ref 2.0–3.0)

## 2022-02-23 ENCOUNTER — Encounter: Payer: Self-pay | Admitting: Physical Therapy

## 2022-02-23 ENCOUNTER — Ambulatory Visit: Payer: Medicare Other | Admitting: Physical Therapy

## 2022-02-23 DIAGNOSIS — R2681 Unsteadiness on feet: Secondary | ICD-10-CM

## 2022-02-23 DIAGNOSIS — R270 Ataxia, unspecified: Secondary | ICD-10-CM

## 2022-02-23 DIAGNOSIS — R278 Other lack of coordination: Secondary | ICD-10-CM | POA: Diagnosis not present

## 2022-02-23 DIAGNOSIS — R29898 Other symptoms and signs involving the musculoskeletal system: Secondary | ICD-10-CM

## 2022-02-23 DIAGNOSIS — R2689 Other abnormalities of gait and mobility: Secondary | ICD-10-CM

## 2022-02-23 DIAGNOSIS — R29818 Other symptoms and signs involving the nervous system: Secondary | ICD-10-CM

## 2022-02-23 DIAGNOSIS — M6281 Muscle weakness (generalized): Secondary | ICD-10-CM

## 2022-02-23 DIAGNOSIS — I69354 Hemiplegia and hemiparesis following cerebral infarction affecting left non-dominant side: Secondary | ICD-10-CM | POA: Diagnosis not present

## 2022-02-23 NOTE — Therapy (Signed)
[place] ?[place] ?[address] ?[place], [place], [address] ?Phone: [phone]   Fax:  [phone] ? ?Physical Therapy Treatment ? ?Patient Details  ?Name: [name] ?MRN: [id number] ?Date of Birth: 23-Dec-1945 ?Referring Provider (PT): Dr Letta Pate ? ? ?Encounter Date: 02/23/2022 ? ? ? ?Past Medical History:  ?Diagnosis Date  ? Abnormality of gait 09/07/2016  ? Allergy   ? Diabetes mellitus without complication ([place])   ? Patient denies this - notes history of glucose intolerance  ? GERD (gastroesophageal reflux disease)   ? Hemiparesis and alteration of sensations as late effects of stroke ([place]) 09/07/2016  ? History of pneumonia 1997  ? Hypertension   ? S/P MVR (mitral valve replacement)   ? Mechanical mitral valve replacement at age 5 (done in [place])  // echo 7/17: EF 55-60, normal wall motion, bileaflet mechanical mitral valve prosthesis functioning normally, mild LAE, mildly reduced RVSF, small pericardial effusion  ? Stroke [facility]) 1997, 2013, 2015  ? ? ?Past Surgical History:  ?Procedure Laterality Date  ? ABDOMINAL HYSTERECTOMY    ? BRAIN SURGERY    ? BUNIONECTOMY  1993  ? CARDIAC VALVE REPLACEMENT  1997  ? TOE SURGERY  1996  ? ? ?There were no vitals filed for this visit. ? ? Subjective Assessment - 02/23/22 1235   ? ? Subjective Patient reports no changes, no new dizzy episodes. Her L leg is not particularly cooperative today.   ? Patient Stated Goals She would like a tune up, strenghening, balance, coordination, pain contorl.   ? Currently in Pain? No/denies   ? Pain Onset Today   ? ?  ?  ? ?  ? ? ? ? ? ? ? ? ? ? ? ? ? ? ? ? ? ? ? ? [place] Adult PT Treatment/Exercise - 02/23/22 0001   ? ?  ? Knee/Hip Exercises: Aerobic  ? Nustep L4 x 5 minutes   ?  ? Knee/Hip Exercises: Standing  ? Other Standing Knee Exercises Standing hip abduction, started with R foot on floor, L foot on step. Performed active hip abd x 10 reps iwth BUE support. Repeated on LLE.   ?  Other Standing Knee Exercises Crossing R leg over L wiht weight shift onto RLE, then return. She required up to max A for balance, but improved with practice. Also practiced L pover R with up to mod A, used cane for stability.   ? ?  ?  ? ?  ? ? ? ? ? ? ? ? ? ? ? ? PT Short Term Goals - 01/26/22 1314   ? ?  ? PT SHORT TERM GOAL #1  ? Title I with basic HEP   ? Baseline Initiated   ? Time 3   ? Period Weeks   ? Status Achieved   ? Target Date 02/01/22   ? ?  ?  ? ?  ? ? ? ? PT Long Term Goals - 02/23/22 1412   ? ?  ? PT LONG TERM GOAL #1  ? Title I with final HEP   ? Time 2   ? Period Weeks   ? Status On-going   ?  ? PT LONG TERM GOAL #2  ? Title Pt will improve TUG score to less than or equal to 13 seconds or less for pt to not be considered fall risk.   ? Baseline 24.2   ? Time 2   ? Period Weeks   ? Status  On-going   ? Target Date 03/01/22   ?  ? PT LONG TERM GOAL #3  ? Title Improve 5 x STS time to <12 sec to demosntrate improved BLE strength.   ? Baseline 22.25, slighty elevated surface   ? Time 2   ? Period Weeks   ? Status On-going   ?  ? PT LONG TERM GOAL #4  ? Title Patient will score at least 46/56 on BERG Balance test to demosntrate decreased fall risk.   ? Baseline 43/56   ? Time 2   ? Period Weeks   ? Status On-going   ? Target Date 03/01/22   ?  ? PT LONG TERM GOAL #5  ? Title Patient will walk at least 200', on level and unlevel surface, with LRAD, MI.   ? Baseline 200' with cane on unlevel surfaces, occ CGA   ? Time 2   ? Period Weeks   ? Status On-going   ? Target Date 03/04/22   ? ?  ?  ? ?  ? ? ? ? ? ? ? ? Plan - 02/23/22 1236   ? ? Clinical Impression Statement Patient eports no changes, no more dizzy episodes. LLE cooperation fluctuates per patient. Treatment focused on trunk stabilty and strength as patient continues to report unsteadiness in the wind. She over utilizes R side, with decreased control and trust in L. Treatemtn focused on trunk stabilty to imporve her comfort walking in windy  condition, as well as attempting to increase LLE use and stability.   ? 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 Examination-Activity Limitations Locomotion Level;Transfers;Stairs;Stand   ? Examination-Participation Restrictions Community Activity;Other   ? Stability/Clinical Decision Making Stable/Uncomplicated   ? Clinical Decision Making Moderate   ? Rehab Potential Good   ? PT Frequency 2x / week   ? PT Duration 2 weeks   ? PT Treatment/Interventions ADLs/Self Care Home Management;DME Instruction;Gait training;Stair training;Functional mobility training;Therapeutic activities;Therapeutic exercise;Balance training;Neuromuscular re-education;Patient/family education;Passive range of motion;Orthotic Fit/Training;Electrical Stimulation;Moist Heat;Iontophoresis '4mg'$ /ml Dexamethasone;Dry needling;Manual techniques   ? PT Next Visit Plan L LE stability exercises, balance.   ? PT Home Exercise Plan SLS at counter.   ? Consulted and Agree with Plan of Care Patient   ? ?  ?  ? ?  ? ? ?Patient will benefit from skilled therapeutic intervention in order to improve the following deficits and impairments:  Abnormal gait, Difficulty walking, Decreased range of motion, Impaired tone, Pain, Decreased balance, Decreased mobility, Decreased strength, Postural dysfunction, Improper body mechanics, Increased muscle spasms ? ?Visit Diagnosis: ?Hemiplegia and hemiparesis following cerebral infarction affecting left non-dominant side ([employer]) ? ?Other lack of coordination ? ?Other symptoms and signs involving the nervous system ? ?Other symptoms and signs involving the musculoskeletal system ? ?Other abnormalities of gait and mobility ? ?Muscle weakness (generalized) ? ?Unsteadiness on feet ? ?Ataxia ? ? ? ? ?Problem List ?Patient Active Problem List  ? Diagnosis Date Noted  ? Goals of care, counseling/discussion   ? Palliative care by specialist   ? Acute CVA (cerebrovascular accident)  ([place]) 07/01/2020  ? 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 Anticoagulated on warfarin   ? Left hemiparesis ([place])   ? History of CVA with residual deficit   ? Thrombocytopenia ([place])   ? Prediabetes   ? CVA (cerebral vascular accident) ([place]) 09/03/2019  ? Vertigo 09/24/2017  ? Benign paroxysmal positional vertigo   ? History of stroke   ?  Hyperlipemia 08/23/2017  ? Encounter for therapeutic drug monitoring 08/08/2017  ? Long term (current) use of anticoagulants [Z79.01] 07/12/2017  ? Hemiparesis and alteration of sensations as late effects of stroke ([place]) 09/07/2016  ? Abnormality of gait 09/07/2016  ? HTN (hypertension) 04/13/2016  ? H/O mitral valve replacement with mechanical valve 10/16/2015  ? Diabetes mellitus without complication ([place])   ? Hemiparesis (L sided - mild) due to old stroke [facility])   ? ? ?Marcelina Morel, DPT ?02/23/2022, 2:14 PM ? ?[place] ?[address] ?[address] ?[place], [place], [address] ?Phone: [phone]   Fax:  [phone] ? ?Name: MIKYAH ALAMO ?MRN: [id number] ?Date of Birth: 1945/11/11 ? ? ? ?

## 2022-02-25 ENCOUNTER — Encounter: Payer: Self-pay | Admitting: Physical Therapy

## 2022-02-25 ENCOUNTER — Ambulatory Visit: Payer: Medicare Other | Admitting: Physical Therapy

## 2022-02-25 DIAGNOSIS — R29898 Other symptoms and signs involving the musculoskeletal system: Secondary | ICD-10-CM

## 2022-02-25 DIAGNOSIS — R2689 Other abnormalities of gait and mobility: Secondary | ICD-10-CM | POA: Diagnosis not present

## 2022-02-25 DIAGNOSIS — M6281 Muscle weakness (generalized): Secondary | ICD-10-CM | POA: Diagnosis not present

## 2022-02-25 DIAGNOSIS — I69354 Hemiplegia and hemiparesis following cerebral infarction affecting left non-dominant side: Secondary | ICD-10-CM | POA: Diagnosis not present

## 2022-02-25 DIAGNOSIS — R29818 Other symptoms and signs involving the nervous system: Secondary | ICD-10-CM | POA: Diagnosis not present

## 2022-02-25 DIAGNOSIS — R278 Other lack of coordination: Secondary | ICD-10-CM

## 2022-02-25 NOTE — Therapy (Signed)
[facility] [address] ?[place], [place], [address] ?Phone: [phone]   Fax:  [phone] ? ?Physical Therapy Treatment ? ?Patient Details  ?Name: [name] ?MRN: [id number] ?Date of Birth: Nov 27, 1945 ?Referring Provider (PT): Dr Letta Pate ? ? ?Encounter Date: 02/25/2022 ? ? PT End of Session - 02/25/22 1402   ? ? Visit Number 13   ? Number of Visits 21   ? Date for PT Re-Evaluation 03/25/22   ? Authorization Type MCR and AARP   ? Authorization Time Period extended to 03/25/22   ? Progress Note Due on Visit 20   ? PT Start Time 1316   ? PT Stop Time 1356   ? PT Time Calculation (min) 40 min   ? Equipment Utilized During Treatment Gait belt   ? Activity Tolerance Patient tolerated treatment well   ? Behavior During Therapy [facility] for tasks assessed/performed   ? ?  ?  ? ?  ? ? ?Past Medical History:  ?Diagnosis Date  ? Abnormality of gait 09/07/2016  ? Allergy   ? Diabetes mellitus without complication ([place])   ? Patient denies this - notes history of glucose intolerance  ? GERD (gastroesophageal reflux disease)   ? Hemiparesis and alteration of sensations as late effects of stroke ([place]) 09/07/2016  ? History of pneumonia 1997  ? Hypertension   ? S/P MVR (mitral valve replacement)   ? Mechanical mitral valve replacement at age 63 (done in [place])  // echo 7/17: EF 55-60, normal wall motion, bileaflet mechanical mitral valve prosthesis functioning normally, mild LAE, mildly reduced RVSF, small pericardial effusion  ? Stroke [facility]) 1997, 2013, 2015  ? ? ?Past Surgical History:  ?Procedure Laterality Date  ? ABDOMINAL HYSTERECTOMY    ? BRAIN SURGERY    ? BUNIONECTOMY  1993  ? CARDIAC VALVE REPLACEMENT  1997  ? TOE SURGERY  1996  ? ? ?There were no vitals filed for this visit. ? ? Subjective Assessment - 02/25/22 1318   ? ? Subjective Progress is here and there with some things, in the big picture I was doing things in the community before but not as smoothly or  easily. Walking in a tighter area has gotten easier for me, balance is hard for me. I can tell I don't have the strength I need to have with my L UE in terms of picking things up or reaching.   ? Patient Stated Goals She would like a tune up, strenghening, balance, coordination, pain contorl.   ? Currently in Pain? No/denies   ? ?  ?  ? ?  ? ? ? ? ? [facility] PT Assessment - 02/25/22 0001   ? ?  ? Assessment  ? Medical Diagnosis weakness, decreaesd blaance   ? Referring Provider (PT) Dr Letta Pate   ? Onset Date/Surgical Date 07/01/20   ? Next MD Visit Dr. Letta Pate maybe in 2 months   ? Prior Therapy d/c OT 12/05/19 from prior CVA, CIR during recent hospitalization   ?  ? Precautions  ? Precautions Fall   ?  ? Restrictions  ? Weight Bearing Restrictions No   ?  ? Balance Screen  ? Has the patient fallen in the past 6 months Yes   ? How many times? 1   ? Has the patient had a decrease in activity level because of a fear of falling?  Yes   ? Is the patient reluctant to leave their home because of a fear  of falling?  No   ?  ? Home Environment  ? Living Environment Private residence   ?  ? Prior Function  ? Level of Independence Independent   ? Leisure [place], indoors, feels unsteady outdoors   ?  ? Strength  ? Left Hip Flexion 3+/5   ? Left Knee Flexion 4/5   ? Left Knee Extension 4+/5   ?  ? Standardized Balance Assessment  ? Five times sit to stand comments  22.42   ?  ? Berg Balance Test  ? Sit to Stand Able to stand without using hands and stabilize independently   ? Standing Unsupported Able to stand safely 2 minutes   ? Sitting with Back Unsupported but Feet Supported on Floor or Stool Able to sit safely and securely 2 minutes   ? Stand to Sit Sits safely with minimal use of hands   ? Transfers Able to transfer safely, minor use of hands   ? Standing Unsupported with Eyes Closed Able to stand 10 seconds safely   ? Standing Unsupported with Feet Together Able to place feet together independently and stand 1 minute  safely   ? From Standing, Reach Forward with Outstretched Arm Can reach forward >5 cm safely (2")   ? From Standing Position, Pick up Object from Floor Able to pick up shoe, needs supervision   ? From Standing Position, Turn to Look Behind Over each Shoulder Turn sideways only but maintains balance   ? Turn 360 Degrees Able to turn 360 degrees safely but slowly   ? Standing Unsupported, Alternately Place Feet on Step/Stool Able to complete 4 steps without aid or supervision   ? Standing Unsupported, One Foot in Front Needs help to step but can hold 15 seconds   ? Standing on One Leg Tries to lift leg/unable to hold 3 seconds but remains standing independently   ? Total Score 41   ?  ? Timed Up and Go Test  ? Normal TUG (seconds) 19.5   SPC  ? ?  ?  ? ?  ? ? ? ? ? ? ? ? ? ? ? ? ? ? ? ? [facility] Adult PT Treatment/Exercise - 02/25/22 0001   ? ?  ? Knee/Hip Exercises: Aerobic  ? Nustep L4x6 minutes BLEs only   ?  ? Knee/Hip Exercises: Standing  ? Other Standing Knee Exercises side steps 58f x2 no device MinA for balance   ? ?  ?  ? ?  ? ? ? ? ? ? ? ? ? ? PT Education - 02/25/22 1401   ? ? Education Details POC moving forward   ? Person(s) Educated Patient   ? Methods Explanation   ? Comprehension Verbalized understanding   ? ?  ?  ? ?  ? ? ? PT Short Term Goals - 02/25/22 1338   ? ?  ? PT SHORT TERM GOAL #1  ? Title I with basic HEP   ? Baseline 4/20- doing daily   ? Time 3   ? Period Weeks   ? Status Achieved   ?  ? PT SHORT TERM GOAL #2  ? Title --   ? Baseline --   ? Time --   ? Period --   ? Status --   ? ?  ?  ? ?  ? ? ? ? PT Long Term Goals - 02/25/22 1339   ? ?  ? PT LONG TERM GOAL #1  ? Title I with final  HEP   ? Time 2   ? Period Weeks   ? Status On-going   ?  ? PT LONG TERM GOAL #2  ? Title Pt will improve TUG score to less than or equal to 13 seconds or less for pt to not be considered fall risk.   ? Baseline 4/20- 19.5 with SPC   ? Time 2   ? Period Weeks   ? Status On-going   ?  ? PT LONG TERM GOAL #3  ?  Title Improve 5 x STS time to <12 sec to demosntrate improved BLE strength.   ? Baseline 4/20- about the same   ? Time 2   ? Period Weeks   ? Status On-going   ?  ? PT LONG TERM GOAL #4  ? Title Patient will score at least 46/56 on BERG Balance test to demosntrate decreased fall risk.   ? Baseline 4/0- 41   ? Time 2   ? Period Weeks   ?  ? PT LONG TERM GOAL #5  ? Title Patient will walk at least 200', on level and unlevel surface, with LRAD, MI.   ? Baseline 4/20- has a hard time walking over uneven surfaces like grass, don't make it as far as I'd like to  but level surfaces are ok   ? Time 2   ? Period Weeks   ? Status Partially Met   ? ?  ?  ? ?  ? ? ? ? ? ? ? ? Plan - 02/25/22 1403   ? ? Clinical Impression Statement Aanvi arrives today doing OK, we needed to get measurements for new MD cert. She does show progress in some areas such as functional strength and in TUG score, however 5x sit to stand and Berg were not quite as good today. I do think she is moving more safely and smoothly, she appears much less unsteady when she is walking in general. She remains really motivated to continue with and work hard in therapy. I think let?s try an additional 4-5 weeks, after that time we will reassess progress and potentially DC if she does not show additional improvement in objective measures versus extending PT program if she does show improvement.   ? Personal Factors and Comorbidities Comorbidity 3+   ? 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 Examination-Activity Limitations Locomotion Level;Transfers;Stairs;Stand   ? Examination-Participation Restrictions Community Activity;Other   ? Stability/Clinical Decision Making Stable/Uncomplicated   ? Clinical Decision Making Moderate   ? Rehab Potential Good   ? PT Frequency 2x / week   ? PT Duration 4 weeks   ? PT Treatment/Interventions ADLs/Self Care Home Management;DME Instruction;Gait training;Stair  training;Functional mobility training;Therapeutic activities;Therapeutic exercise;Balance training;Neuromuscular re-education;Patient/family education;Passive range of motion;Orthotic Fit/Training;Electrical Stimulation;Moist Heat;

## 2022-03-01 ENCOUNTER — Ambulatory Visit (INDEPENDENT_AMBULATORY_CARE_PROVIDER_SITE_OTHER): Payer: Medicare Other | Admitting: *Deleted

## 2022-03-01 DIAGNOSIS — I69359 Hemiplegia and hemiparesis following cerebral infarction affecting unspecified side: Secondary | ICD-10-CM

## 2022-03-01 DIAGNOSIS — Z5181 Encounter for therapeutic drug level monitoring: Secondary | ICD-10-CM

## 2022-03-01 DIAGNOSIS — Z952 Presence of prosthetic heart valve: Secondary | ICD-10-CM | POA: Diagnosis not present

## 2022-03-01 DIAGNOSIS — Z7901 Long term (current) use of anticoagulants: Secondary | ICD-10-CM

## 2022-03-01 LAB — POCT INR: INR: 3.1 — AB (ref 2.0–3.0)

## 2022-03-01 NOTE — Patient Instructions (Signed)
Description   ?Spoke with pt and instructed to continue taking warfarin '5mg'$  daily except 7.'5mg'$  on Mondays. Pt eats broccoli on Fridays.  ?Recheck INR in 1 week per INSURANCE. [facility] [phone] ?  ?  ?

## 2022-03-02 ENCOUNTER — Encounter: Payer: Self-pay | Admitting: Physical Therapy

## 2022-03-02 ENCOUNTER — Ambulatory Visit: Payer: Medicare Other | Admitting: Physical Therapy

## 2022-03-02 DIAGNOSIS — R29818 Other symptoms and signs involving the nervous system: Secondary | ICD-10-CM | POA: Diagnosis not present

## 2022-03-02 DIAGNOSIS — R278 Other lack of coordination: Secondary | ICD-10-CM | POA: Diagnosis not present

## 2022-03-02 DIAGNOSIS — I69354 Hemiplegia and hemiparesis following cerebral infarction affecting left non-dominant side: Secondary | ICD-10-CM

## 2022-03-02 DIAGNOSIS — R29898 Other symptoms and signs involving the musculoskeletal system: Secondary | ICD-10-CM | POA: Diagnosis not present

## 2022-03-02 DIAGNOSIS — M6281 Muscle weakness (generalized): Secondary | ICD-10-CM | POA: Diagnosis not present

## 2022-03-02 DIAGNOSIS — R2689 Other abnormalities of gait and mobility: Secondary | ICD-10-CM

## 2022-03-02 DIAGNOSIS — R270 Ataxia, unspecified: Secondary | ICD-10-CM

## 2022-03-02 DIAGNOSIS — R2681 Unsteadiness on feet: Secondary | ICD-10-CM

## 2022-03-02 NOTE — Therapy (Signed)
[facility] ?[address] ?[place], [place], [address] ?Phone: [phone]   Fax:  [phone] ? ?Physical Therapy Treatment ? ?Patient Details  ?Name: [name] ?MRN: [id number] ?Date of Birth: 1946/04/20 ?Referring Provider (PT): Dr Letta Pate ? ? ?Encounter Date: 03/02/2022 ? ? PT End of Session - 03/02/22 1331   ? ? Visit Number 14   ? Number of Visits 21   ? Date for PT Re-Evaluation 03/25/22   ? Authorization Type MCR and AARP   ? Authorization Time Period extended to 03/25/22   ? Progress Note Due on Visit 20   ? PT Start Time 1228   ? PT Stop Time 9323   ? PT Time Calculation (min) 54 min   ? Equipment Utilized During Treatment Gait belt   ? Activity Tolerance Patient tolerated treatment well   ? Behavior During Therapy [facility] for tasks assessed/performed   ? ?  ?  ? ?  ? ? ?Past Medical History:  ?Diagnosis Date  ? Abnormality of gait 09/07/2016  ? Allergy   ? Diabetes mellitus without complication ([place])   ? Patient denies this - notes history of glucose intolerance  ? GERD (gastroesophageal reflux disease)   ? Hemiparesis and alteration of sensations as late effects of stroke ([place]) 09/07/2016  ? History of pneumonia 1997  ? Hypertension   ? S/P MVR (mitral valve replacement)   ? Mechanical mitral valve replacement at age 4 (done in [place])  // echo 7/17: EF 55-60, normal wall motion, bileaflet mechanical mitral valve prosthesis functioning normally, mild LAE, mildly reduced RVSF, small pericardial effusion  ? Stroke [facility]) 1997, 2013, 2015  ? ? ?Past Surgical History:  ?Procedure Laterality Date  ? ABDOMINAL HYSTERECTOMY    ? BRAIN SURGERY    ? BUNIONECTOMY  1993  ? CARDIAC VALVE REPLACEMENT  1997  ? TOE SURGERY  1996  ? ? ?There were no vitals filed for this visit. ? ? Subjective Assessment - 03/02/22 1230   ? ? Subjective Patient reports that started Friday evening she has developed low back, L hip and outer leg pain. It grew more severe last night, up  to a level 6, whihc she describes a severe. She needed to get her husband to help her walkin to the bathroom due to fear of falling.   ? Patient Stated Goals She would like a tune up, strenghening, balance, coordination, pain contorl.   ? Currently in Pain? Yes   ? Pain Score 1    Patient reports that she used [employer] andTylenol and a heating pad.  ? Pain Orientation Left   ? Pain Descriptors / Indicators Jabbing;Sharp   ? Pain Type Acute pain   ? Pain Radiating Towards lateral leg to knee   ? Pain Onset In the past 7 days   ? Pain Frequency Intermittent   ? Pain Relieving Factors Patient reports that she used [employer] and a heating pad.   ? Multiple Pain Sites No   ? ?  ?  ? ?  ? ? ? ? ? [facility] PT Assessment - 03/02/22 0001   ? ?  ? Flexibility  ? Soft Tissue Assessment /Muscle Length yes   ? Hamstrings L SLR < 45, R approximatley 50 degrees.   ? ITB L ITB TTP and tight throughout.   ?  ? Palpation  ? Palpation comment Patient continues to reprot TTP on L gluts, just inferior to iliac crest, also  mildy tender on R gluts.   ?  ? Special Tests  ?  Special Tests Sacrolliac Tests   ? Sacroiliac Tests  --   Assessed for ant/post torsion on B sides. paitent reported mild discomfort with L ant torsion, but improved with repeated movement.  ? ?  ?  ? ?  ? ? ? ? ? ? ? ? ? ? ? ? ? ? ? ? [place] Adult PT Treatment/Exercise - 03/02/22 0001   ? ?  ? Knee/Hip Exercises: Stretches  ? Passive Hamstring Stretch Both;3 reps;30 seconds   ? Passive Hamstring Stretch Limitations In sitting   ? ITB Stretch Both;1 rep;30 seconds   ? Piriformis Stretch Both;1 rep;30 seconds   ? Other Knee/Hip Stretches Single and Double knee to chest 2 x 30 seconds   ? ?  ?  ? ?  ? ? ? ? ? ? ? ? ? ? PT Education - 03/02/22 1331   ? ? Education Details Updated HEP focusing on stretch to hips.   ? Person(s) Educated Patient   ? Methods Explanation;Demonstration;Handout   ? Comprehension Returned demonstration;Verbalized understanding   ? ?  ?  ? ?   ? ? ? PT Short Term Goals - 02/25/22 1338   ? ?  ? PT SHORT TERM GOAL #1  ? Title I with basic HEP   ? Baseline 4/20- doing daily   ? Time 3   ? Period Weeks   ? Status Achieved   ?  ? PT SHORT TERM GOAL #2  ? Title --   ? Baseline --   ? Time --   ? Period --   ? Status --   ? ?  ?  ? ?  ? ? ? ? PT Long Term Goals - 02/25/22 1339   ? ?  ? PT LONG TERM GOAL #1  ? Title I with final HEP   ? Time 2   ? Period Weeks   ? Status On-going   ?  ? PT LONG TERM GOAL #2  ? Title Pt will improve TUG score to less than or equal to 13 seconds or less for pt to not be considered fall risk.   ? Baseline 4/20- 19.5 with SPC   ? Time 2   ? Period Weeks   ? Status On-going   ?  ? PT LONG TERM GOAL #3  ? Title Improve 5 x STS time to <12 sec to demosntrate improved BLE strength.   ? Baseline 4/20- about the same   ? Time 2   ? Period Weeks   ? Status On-going   ?  ? PT LONG TERM GOAL #4  ? Title Patient will score at least 46/56 on BERG Balance test to demosntrate decreased fall risk.   ? Baseline 4/0- 41   ? Time 2   ? Period Weeks   ?  ? PT LONG TERM GOAL #5  ? Title Patient will walk at least 200', on level and unlevel surface, with LRAD, MI.   ? Baseline 4/20- has a hard time walking over uneven surfaces like grass, don't make it as far as I'd like to  but level surfaces are ok   ? Time 2   ? Period Weeks   ? Status Partially Met   ? ?  ?  ? ?  ? ? ? ? ? ? ? ? Plan - 03/02/22 1310   ? ? Clinical Impression Statement Patient reports new  onset of severe pain in her L buttocks and lateral thigh. It has interfered in her mobility, especially at night. therapist assessed the area and found tightness in gluts, ITB, with TTP. She also demosntrates severely tight B hamstrings. She demosntrated some questionable L SI post torsion, but it appeared to be relieved with repeated movements. Updated HEP to emphasize multiple stretches for her hip and lateral leg, which she was able to demosntrate. Encouraged her to perform the stretches again  prior to next visit to assess her tolerance and see if they help.   ? Personal Factors and Comorbidities Comorbidity 3+   ? 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 Examination-Activity Limitations Locomotion Level;Transfers;Stairs;Stand   ? Examination-Participation Restrictions Community Activity;Other   ? Stability/Clinical Decision Making Stable/Uncomplicated   ? Clinical Decision Making Moderate   ? Rehab Potential Good   ? PT Frequency 2x / week   ? PT Duration 3 weeks   ? PT Treatment/Interventions ADLs/Self Care Home Management;DME Instruction;Gait training;Stair training;Functional mobility training;Therapeutic activities;Therapeutic exercise;Balance training;Neuromuscular re-education;Patient/family education;Passive range of motion;Orthotic Fit/Training;Electrical Stimulation;Moist Heat;Iontophoresis 83m/ml Dexamethasone;Dry needling;Manual techniques   ? PT Next Visit Plan Assess toelrance to HEP and assess hip and leg pain.   ? PT Home Exercise Plan Access Code: [email or web address]  URL: [email or web address]  Date: 03/02/2022  Prepared by: SEthel Rana   Exercises  - Supine Figure 4 Piriformis Stretch  - 1 x daily - 7 x weekly - 3 reps - 20 hold  - Supine ITB Stretch  - 1 x daily - 7 x weekly - 3 reps - 20 hold  - Single Knee to Chest Stretch  - 1 x daily - 7 x weekly - 3 reps - 20 hold  - Supine Double Knee to Chest  - 1 x daily - 7 x weekly - 3 reps - 20 hold  - Supine Bridge with Resistance Band  - 1 x daily - 7 x weekly - 2 sets - 10 reps  - Seated Hamstring Stretch  - 1 x daily - 7 x weekly - 3 reps - 20 hold   ? Consulted and Agree with Plan of Care Patient   ? ?  ?  ? ?  ? ? ?Patient will benefit from skilled therapeutic intervention in order to improve the following deficits and impairments:  Abnormal gait, Difficulty walking, Decreased range of motion, Impaired tone, Pain, Decreased balance, Decreased mobility, Decreased strength, Postural  dysfunction, Improper body mechanics, Increased muscle spasms ? ?Visit Diagnosis: ?Hemiplegia and hemiparesis following cerebral infarction affecting left non-dominant side ([place] ? ?Other lack of coordinatio

## 2022-03-04 ENCOUNTER — Ambulatory Visit: Payer: Medicare Other | Admitting: Physical Therapy

## 2022-03-04 ENCOUNTER — Encounter: Payer: Self-pay | Admitting: Physical Therapy

## 2022-03-04 DIAGNOSIS — R29898 Other symptoms and signs involving the musculoskeletal system: Secondary | ICD-10-CM

## 2022-03-04 DIAGNOSIS — R2689 Other abnormalities of gait and mobility: Secondary | ICD-10-CM | POA: Diagnosis not present

## 2022-03-04 DIAGNOSIS — R29818 Other symptoms and signs involving the nervous system: Secondary | ICD-10-CM | POA: Diagnosis not present

## 2022-03-04 DIAGNOSIS — R278 Other lack of coordination: Secondary | ICD-10-CM

## 2022-03-04 DIAGNOSIS — R2681 Unsteadiness on feet: Secondary | ICD-10-CM

## 2022-03-04 DIAGNOSIS — R270 Ataxia, unspecified: Secondary | ICD-10-CM

## 2022-03-04 DIAGNOSIS — I69354 Hemiplegia and hemiparesis following cerebral infarction affecting left non-dominant side: Secondary | ICD-10-CM

## 2022-03-04 DIAGNOSIS — M6281 Muscle weakness (generalized): Secondary | ICD-10-CM | POA: Diagnosis not present

## 2022-03-04 NOTE — Therapy (Signed)
[facility] ?[address] ?[place], [place], [address] ?Phone: [phone]   Fax:  [phone] ? ?Physical Therapy Treatment ? ?Patient Details  ?Name: [name] ?MRN: [id number] ?Date of Birth: October 08, 1946 ?Referring Provider (PT): Dr Letta Pate ? ? ?Encounter Date: 03/04/2022 ? ? PT End of Session - 03/04/22 1358   ? ? Visit Number 15   ? Number of Visits 21   ? Date for PT Re-Evaluation 03/25/22   ? Authorization Type MCR and AARP   ? Authorization Time Period extended to 03/25/22   ? Progress Note Due on Visit 20   ? PT Start Time 1314   ? PT Stop Time 2778   ? PT Time Calculation (min) 43 min   ? Equipment Utilized During Treatment Gait belt   ? Activity Tolerance Patient tolerated treatment well   ? Behavior During Therapy [facility] for tasks assessed/performed   ? ?  ?  ? ?  ? ? ?Past Medical History:  ?Diagnosis Date  ? Abnormality of gait 09/07/2016  ? Allergy   ? Diabetes mellitus without complication ([place])   ? Patient denies this - notes history of glucose intolerance  ? GERD (gastroesophageal reflux disease)   ? Hemiparesis and alteration of sensations as late effects of stroke ([place]) 09/07/2016  ? History of pneumonia 1997  ? Hypertension   ? S/P MVR (mitral valve replacement)   ? Mechanical mitral valve replacement at age 76 (done in [place])  // echo 7/17: EF 55-60, normal wall motion, bileaflet mechanical mitral valve prosthesis functioning normally, mild LAE, mildly reduced RVSF, small pericardial effusion  ? Stroke [facility]) 1997, 2013, 2015  ? ? ?Past Surgical History:  ?Procedure Laterality Date  ? ABDOMINAL HYSTERECTOMY    ? BRAIN SURGERY    ? BUNIONECTOMY  1993  ? CARDIAC VALVE REPLACEMENT  1997  ? TOE SURGERY  1996  ? ? ?There were no vitals filed for this visit. ? ? Subjective Assessment - 03/04/22 1319   ? ? Subjective Patient reports that she is near back to her baseline. She is unsure whether the new stretches are helping.   ? Patient Stated Goals  She would like a tune up, strenghening, balance, coordination, pain contorl.   ? Currently in Pain? No/denies   ? Pain Onset In the past 7 days   ? ?  ?  ? ?  ? ? ? ? ? ? ? ? ? ? ? ? ? ? ? ? ? ? ? ? [place] Adult PT Treatment/Exercise - 03/04/22 0001   ? ?  ? Knee/Hip Exercises: Aerobic  ? Nustep L5 x 6 minutes, BLE only.   ?  ? Knee/Hip Exercises: Standing  ? Other Standing Knee Exercises Step taps on 4" steps, alternating forward taps, then moved to the side and performed taps on each side 10 reps each, with SPC and CGA.   ? Other Standing Knee Exercises Crossovers to each side, required up to max A for gong to her L, but improved to min/cGA with repetition.   ? ?  ?  ? ?  ? ? ? ? ? ? ? ? ? ? ? ? PT Short Term Goals - 02/25/22 1338   ? ?  ? PT SHORT TERM GOAL #1  ? Title I with basic HEP   ? Baseline 4/20- doing daily   ? Time 3   ? Period Weeks   ? Status Achieved   ?  ?  PT SHORT TERM GOAL #2  ? Title --   ? Baseline --   ? Time --   ? Period --   ? Status --   ? ?  ?  ? ?  ? ? ? ? PT Long Term Goals - 03/04/22 1358   ? ?  ? PT LONG TERM GOAL #1  ? Title I with final HEP   ? Time 2   ? Period Weeks   ? Status Partially Met   ?  ? PT LONG TERM GOAL #2  ? Title Pt will improve TUG score to less than or equal to 13 seconds or less for pt to not be considered fall risk.   ? Baseline 4/20- 19.5 with SPC   ? Time 2   ? Period Weeks   ? Status Partially Met   ?  ? PT LONG TERM GOAL #3  ? Title Improve 5 x STS time to <12 sec to demosntrate improved BLE strength.   ? Baseline 4/20- about the same   ? Time 2   ? Period Weeks   ? Status Partially Met   ?  ? PT LONG TERM GOAL #4  ? Title Patient will score at least 46/56 on BERG Balance test to demosntrate decreased fall risk.   ? Baseline 4/0- 41   ? Time 2   ? Period Weeks   ? Status On-going   ?  ? PT LONG TERM GOAL #5  ? Title Patient will walk at least 200', on level and unlevel surface, with LRAD, MI.   ? Baseline 4/20- has a hard time walking over uneven surfaces like  grass, don't make it as far as I'd like to  but level surfaces are ok   ? Time 2   ? Period Weeks   ? Status On-going   ? ?  ?  ? ?  ? ? ? ? ? ? ? ? Plan - 03/04/22 1323   ? ? Clinical Impression Statement Patient reports that he pain in LLE is imporved, but still feels tight. therapsit demosntrated 2 additional stretches for lateral LLE, performed in sitting, then continued with strength and balance training. Patient demosntrated imporved LLE control in stance today, and swing.   ? Personal Factors and Comorbidities Comorbidity 3+   ? 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 Examination-Activity Limitations Locomotion Level;Transfers;Stairs;Stand   ? Examination-Participation Restrictions Community Activity;Other   ? Stability/Clinical Decision Making Stable/Uncomplicated   ? Clinical Decision Making Moderate   ? Rehab Potential Good   ? PT Frequency 2x / week   ? PT Duration 3 weeks   ? PT Treatment/Interventions ADLs/Self Care Home Management;DME Instruction;Gait training;Stair training;Functional mobility training;Therapeutic activities;Therapeutic exercise;Balance training;Neuromuscular re-education;Patient/family education;Passive range of motion;Orthotic Fit/Training;Electrical Stimulation;Moist Heat;Iontophoresis 4mg /ml Dexamethasone;Dry needling;Manual techniques   ? PT Next Visit Plan continue to train coordination of LLE.   ? PT Home Exercise Plan Access Code: [email or web address]   ? Consulted and Agree with Plan of Care Patient   ? ?  ?  ? ?  ? ? ?Patient will benefit from skilled therapeutic intervention in order to improve the following deficits and impairments:  Abnormal gait, Difficulty walking, Decreased range of motion, Impaired tone, Pain, Decreased balance, Decreased mobility, Decreased strength, Postural dysfunction, Improper body mechanics, Increased muscle spasms ? ?Visit Diagnosis: ?Hemiplegia and hemiparesis following cerebral infarction affecting left  non-dominant side ([place]) ? ?Other lack of coordination ? ?Other symptoms  and signs involving the nervous system ? ?Other symptoms and signs involving the musculoskeletal system ? ?Other abnormalities of gait and mobility ? ?Muscle weakness (generalized) ? ?Unsteadiness on feet ? ?Ataxia ? ? ? ? ?Problem List ?Patient Active Problem List  ? Diagnosis Date Noted  ? Goals of care, counseling/discussion   ? Palliative care by specialist   ? Acute CVA (cerebrovascular accident) ([place]) 07/01/2020  ? 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 Anticoagulated on warfarin   ? Left hemiparesis ([place])   ? History of CVA with residual deficit   ? Thrombocytopenia ([place])   ? Prediabetes   ? CVA (cerebral vascular accident) ([place]) 09/03/2019  ? Vertigo 09/24/2017  ? Benign paroxysmal positional vertigo   ? History of stroke   ? Hyperlipemia 08/23/2017  ? Encounter for therapeutic drug monitoring 08/08/2017  ? Long term (current) use of anticoagulants [Z79.01] 07/12/2017  ? Hemiparesis and alteration of sensations as late effects of stroke ([place]) 09/07/2016  ? Abnormality of gait 09/07/2016  ? HTN (hypertension) 04/13/2016  ? H/O mitral valve replacement with mechanical valve 10/16/2015  ? Diabetes mellitus without complication ([place])   ? Hemiparesis (L sided - mild) due to old stroke [facility])   ? ? ?Marcelina Morel, DPT ?03/04/2022, 2:00 PM ? ?[facility] ?[place] ?[address] ?[place], [place], [address] ?Phone: [phone]   Fax:  [phone] ? ?Name: [name] ?MRN: [id number] ?Date of Birth: 1946/01/08 ? ? ? ?

## 2022-03-08 ENCOUNTER — Ambulatory Visit (INDEPENDENT_AMBULATORY_CARE_PROVIDER_SITE_OTHER): Payer: Medicare Other | Admitting: *Deleted

## 2022-03-08 ENCOUNTER — Ambulatory Visit: Payer: Medicare Other | Admitting: Physical Therapy

## 2022-03-08 DIAGNOSIS — Z952 Presence of prosthetic heart valve: Secondary | ICD-10-CM

## 2022-03-08 DIAGNOSIS — Z5181 Encounter for therapeutic drug level monitoring: Secondary | ICD-10-CM | POA: Diagnosis not present

## 2022-03-08 DIAGNOSIS — Z7901 Long term (current) use of anticoagulants: Secondary | ICD-10-CM

## 2022-03-08 DIAGNOSIS — I69359 Hemiplegia and hemiparesis following cerebral infarction affecting unspecified side: Secondary | ICD-10-CM | POA: Diagnosis not present

## 2022-03-08 LAB — POCT INR: INR: 3.3 — AB (ref 2.0–3.0)

## 2022-03-08 NOTE — Patient Instructions (Signed)
Description   ?Spoke with pt and instructed to continue taking warfarin '5mg'$  daily except 7.'5mg'$  on Mondays. Pt eats broccoli on Fridays.  ?Recheck INR in 1 week per INSURANCE. [facility] [phone] ?  ?  ?

## 2022-03-09 ENCOUNTER — Other Ambulatory Visit: Payer: Self-pay | Admitting: Physician Assistant

## 2022-03-10 ENCOUNTER — Ambulatory Visit: Payer: Medicare Other | Admitting: Physical Therapy

## 2022-03-15 ENCOUNTER — Ambulatory Visit (INDEPENDENT_AMBULATORY_CARE_PROVIDER_SITE_OTHER): Payer: Medicare Other | Admitting: *Deleted

## 2022-03-15 DIAGNOSIS — I69359 Hemiplegia and hemiparesis following cerebral infarction affecting unspecified side: Secondary | ICD-10-CM

## 2022-03-15 DIAGNOSIS — Z952 Presence of prosthetic heart valve: Secondary | ICD-10-CM

## 2022-03-15 DIAGNOSIS — Z5181 Encounter for therapeutic drug level monitoring: Secondary | ICD-10-CM | POA: Diagnosis not present

## 2022-03-15 DIAGNOSIS — Z7901 Long term (current) use of anticoagulants: Secondary | ICD-10-CM

## 2022-03-15 LAB — POCT INR: INR: 4 — AB (ref 2.0–3.0)

## 2022-03-15 NOTE — Patient Instructions (Signed)
Description   ?Spoke with pt and instructed to take 2.'5mg'$  (1/2 tablet) tonight then continue taking warfarin '5mg'$  daily except 7.'5mg'$  on Mondays. Pt eats broccoli on Fridays.  ?Recheck INR in 1 week per INSURANCE. [facility] [phone] ?  ?  ?

## 2022-03-16 ENCOUNTER — Encounter: Payer: Self-pay | Admitting: Physical Therapy

## 2022-03-16 ENCOUNTER — Ambulatory Visit: Payer: Medicare Other | Attending: Physical Medicine & Rehabilitation | Admitting: Physical Therapy

## 2022-03-16 DIAGNOSIS — R270 Ataxia, unspecified: Secondary | ICD-10-CM | POA: Insufficient documentation

## 2022-03-16 DIAGNOSIS — I69354 Hemiplegia and hemiparesis following cerebral infarction affecting left non-dominant side: Secondary | ICD-10-CM | POA: Diagnosis not present

## 2022-03-16 DIAGNOSIS — R293 Abnormal posture: Secondary | ICD-10-CM | POA: Diagnosis not present

## 2022-03-16 DIAGNOSIS — R2689 Other abnormalities of gait and mobility: Secondary | ICD-10-CM | POA: Diagnosis not present

## 2022-03-16 DIAGNOSIS — R29818 Other symptoms and signs involving the nervous system: Secondary | ICD-10-CM | POA: Insufficient documentation

## 2022-03-16 DIAGNOSIS — M6281 Muscle weakness (generalized): Secondary | ICD-10-CM | POA: Diagnosis not present

## 2022-03-16 DIAGNOSIS — R2681 Unsteadiness on feet: Secondary | ICD-10-CM | POA: Insufficient documentation

## 2022-03-16 DIAGNOSIS — R29898 Other symptoms and signs involving the musculoskeletal system: Secondary | ICD-10-CM | POA: Insufficient documentation

## 2022-03-16 NOTE — Therapy (Signed)
[facility] ?[place], [place], [address] ?Phone: [phone]   Fax:  [phone] ? ?Physical Therapy Treatment ? ?Patient Details  ?Name: [name] ?MRN: [id number] ?Date of Birth: [birth year] ?Referring Provider (PT): Dr Letta Pate ? ? ?Encounter Date: 03/16/2022 ? ? PT End of Session - 03/16/22 1400   ? ? Visit Number 16   ? Number of Visits 21   ? Date for PT Re-Evaluation 03/25/22   ? Authorization Type MCR and AARP   ? Authorization Time Period extended to 03/25/22   ? Progress Note Due on Visit 20   ? PT Start Time 6962   ? PT Stop Time 9528   ? PT Time Calculation (min) 40 min   ? Equipment Utilized During Treatment Gait belt   ? Activity Tolerance Patient tolerated treatment well   ? Behavior During Therapy [facility] for tasks assessed/performed   ? ?  ?  ? ?  ? ? ?Past Medical History:  ?Diagnosis Date  ? Abnormality of gait 09/07/2016  ? Allergy   ? Diabetes mellitus without complication ([place])   ? Patient denies this - notes history of glucose intolerance  ? GERD (gastroesophageal reflux disease)   ? Hemiparesis and alteration of sensations as late effects of stroke ([place]) 09/07/2016  ? History of pneumonia 1997  ? Hypertension   ? S/P MVR (mitral valve replacement)   ? Mechanical mitral valve replacement at age 42 (done in [place])  // echo 7/17: EF 55-60, normal wall motion, bileaflet mechanical mitral valve prosthesis functioning normally, mild LAE, mildly reduced RVSF, small pericardial effusion  ? Stroke [facility]) 1997, 2013, 2015  ? ? ?Past Surgical History:  ?Procedure Laterality Date  ? ABDOMINAL HYSTERECTOMY    ? BRAIN SURGERY    ? BUNIONECTOMY  1993  ? CARDIAC VALVE REPLACEMENT  1997  ? TOE SURGERY  1996  ? ? ?There were no vitals filed for this visit. ? ? Subjective Assessment - 03/16/22 1318   ? ? Subjective patient reports severe increase in her pain in her LLE, she was not able to walk by herself and her husband has had to help her getting  in and out of bed. She arives using a quad cane because she cant use a straight cane. She feels pressure on lateral upper leg, shooting pain in mostly her lateral L leg to her foot when walking. in past 48 hours lowest to 4, up to 8-9/10   ? Patient Stated Goals She would like a tune up, strenghening, balance, coordination, pain contorl.   ? Currently in Pain? Yes   ? Pain Score 5    ? Pain Location Leg   ? Pain Orientation Left;Proximal;Distal;Upper;Mid;Lower;Lateral   ? Pain Descriptors / Indicators Shooting;Pressure   ? Pain Type Acute pain   ? Pain Radiating Towards Pressure on lateral upper leg, shoots into lateral foot when walking.   ? Pain Onset 1 to 4 weeks ago   ? Pain Frequency Constant   ? Pain Relieving Factors Heating pad provides some relief, inconsistent.   ? ?  ?  ? ?  ? ? ? ? ? [facility] PT Assessment - 03/16/22 0001   ? ?  ? AROM  ? Overall AROM Comments Lumbar ROM assesed with slightly decresed rotation to R and patient reported she felt a stretch in L lateral trunk when sidebending to R.   ?  ? Palpation  ? Palpation comment Patient was TTP along  L piriformis as well as L gluts just inferior to iliac crest.   ? ?  ?  ? ?  ? ? ? ? ? ? ? ? ? ? ? ? ? ? ? ? [place] Adult PT Treatment/Exercise - 03/16/22 0001   ? ?  ? Bed Mobility  ? Bed Mobility Sit to Supine;Supine to Sit   ? Supine to Sit Minimal Assistance - Patient > 75%   ? Sit to Supine Minimal Assistance - Patient > 75%   ?  ? Transfers  ? Transfers Sit to Stand   ? Sit to Stand 6: Modified independent (Device/Increase time)   ?  ? Ambulation/Gait  ? Gait Comments Paitent ambulated 2 x 37' with [facility], [place]. She demosntrates decreased weight shifts onto LLE, decreased step length, antalgic gait on L.   ?  ? Knee/Hip Exercises: Supine  ? Other Supine Knee/Hip Exercises Clamshell against red tband resistance.   ?  ? Manual Therapy  ? Manual Therapy Soft tissue mobilization;Passive ROM   ? Soft tissue mobilization Piriformis, Active Release.   ? Passive ROM  Passive stretch to piriformis.   ? ?  ?  ? ?  ? ? ? ? ? ? ? ? ? ? ? ? PT Short Term Goals - 02/25/22 1338   ? ?  ? PT SHORT TERM GOAL #1  ? Title I with basic HEP   ? Baseline 4/20- doing daily   ? Time 3   ? Period Weeks   ? Status Achieved   ?  ? PT SHORT TERM GOAL #2  ? Title --   ? Baseline --   ? Time --   ? Period --   ? Status --   ? ?  ?  ? ?  ? ? ? ? PT Long Term Goals - 03/16/22 1405   ? ?  ? PT LONG TERM GOAL #1  ? Title I with final HEP   ? Time 4   ? Period Weeks   ? Status On-going   ? Target Date 04/13/22   ?  ? PT LONG TERM GOAL #2  ? Title Pt will improve TUG score to less than or equal to 13 seconds or less for pt to not be considered fall risk.   ? Baseline 4/20- 19.5 with SPC, unable to re-assess due to L hip pain.   ? Time 4   ? Period Weeks   ? Status On-going   ? Target Date 04/13/22   ?  ? PT LONG TERM GOAL #3  ? Title Improve 5 x STS time to <12 sec to demosntrate improved BLE strength.   ? Baseline 4/20- about the same, unable to re-assess due to L hip pain.   ? Time 4   ? Period Weeks   ? Status On-going   ? Target Date 04/13/22   ?  ? PT LONG TERM GOAL #4  ? Title Patient will score at least 46/56 on BERG Balance test to demosntrate decreased fall risk.   ? Baseline 4/0- 41, unable to re-assess due to L hip pain.   ? Time 4   ? Period Weeks   ? Status On-going   ? Target Date 04/13/22   ?  ? PT LONG TERM GOAL #5  ? Title Patient will walk at least 200', on level and unlevel surface, with LRAD, [place].   ? Baseline 4/20- has a hard time walking over uneven surfaces like grass, don't make  it as far as I'd like to  but level surfaces are ok, unable to re-assess due to L hip pain.   ? Time 4   ? Period Weeks   ? Status On-going   ? Target Date 04/13/22   ? ?  ?  ? ?  ? ? ? ? ? ? ? ? Plan - 03/16/22 1401   ? ? Clinical Impression Statement Patient had to cancel her appointments last week due to severe pain in her L LE, mostly lateral, shooting to her foot when attempting to walk. She arrived using  a LBQC due to need for increased stability in gait. Re-assessment reveals nothing new, TTP in L gluts and piriformis. Lumbar ROM largely intact. She has a Dr appointment on the 16th and will inform him at that time. therpasit provided STM to L piriformis followed by stretch and strenghtening. Will assess patient's tolerance to treatment activities on her next visit. UPOC complete. Unable to re-assess goals due to hip pain.   ? Personal Factors and Comorbidities Comorbidity 3+   ? 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 Examination-Activity Limitations Locomotion Level;Transfers;Stairs;Stand   ? Examination-Participation Restrictions Community Activity;Other   ? Stability/Clinical Decision Making Stable/Uncomplicated   ? Clinical Decision Making Moderate   ? Rehab Potential Good   ? PT Frequency 2x / week   ? PT Duration 3 weeks   ? PT Treatment/Interventions ADLs/Self Care Home Management;DME Instruction;Gait training;Stair training;Functional mobility training;Therapeutic activities;Therapeutic exercise;Balance training;Neuromuscular re-education;Patient/family education;Passive range of motion;Orthotic Fit/Training;Electrical Stimulation;Moist Heat;Iontophoresis '4mg'$ /ml Dexamethasone;Dry needling;Manual techniques   ? PT Next Visit Plan continue to train coordination of LLE.   ? PT Home Exercise Plan Access Code: [email or web address]   ? Consulted and Agree with Plan of Care Patient   ? ?  ?  ? ?  ? ? ?Patient will benefit from skilled therapeutic intervention in order to improve the following deficits and impairments:  Abnormal gait, Difficulty walking, Decreased range of motion, Impaired tone, Pain, Decreased balance, Decreased mobility, Decreased strength, Postural dysfunction, Improper body mechanics, Increased muscle spasms ? ?Visit Diagnosis: ?Hemiplegia and hemiparesis following cerebral infarction affecting left non-dominant side ([place]) ? ?Other symptoms and signs  involving the nervous system ? ?Other symptoms and signs involving the musculoskeletal system ? ?Muscle weakness (generalized) ? ?Other abnormalities of gait and mobility ? ?Unsteadiness on feet ? ?[place]

## 2022-03-18 ENCOUNTER — Encounter: Payer: Self-pay | Admitting: Physical Therapy

## 2022-03-18 ENCOUNTER — Ambulatory Visit: Payer: Medicare Other | Admitting: Physical Therapy

## 2022-03-18 DIAGNOSIS — R2681 Unsteadiness on feet: Secondary | ICD-10-CM

## 2022-03-18 DIAGNOSIS — R293 Abnormal posture: Secondary | ICD-10-CM

## 2022-03-18 DIAGNOSIS — R29898 Other symptoms and signs involving the musculoskeletal system: Secondary | ICD-10-CM | POA: Diagnosis not present

## 2022-03-18 DIAGNOSIS — M6281 Muscle weakness (generalized): Secondary | ICD-10-CM | POA: Diagnosis not present

## 2022-03-18 DIAGNOSIS — R2689 Other abnormalities of gait and mobility: Secondary | ICD-10-CM

## 2022-03-18 DIAGNOSIS — R270 Ataxia, unspecified: Secondary | ICD-10-CM

## 2022-03-18 DIAGNOSIS — I69354 Hemiplegia and hemiparesis following cerebral infarction affecting left non-dominant side: Secondary | ICD-10-CM

## 2022-03-18 DIAGNOSIS — R29818 Other symptoms and signs involving the nervous system: Secondary | ICD-10-CM

## 2022-03-18 NOTE — Therapy (Signed)
[facility] ?[place], [place], [address] ?Phone: [phone]   Fax:  [phone] ? ?Physical Therapy Treatment ? ?Patient Details  ?Name: [name] ?MRN: [id number] ?Date of Birth: January 21, 1946 ?Referring Provider (PT): Dr Letta Pate ? ? ?Encounter Date: 03/18/2022 ? ? PT End of Session - 03/18/22 1458   ? ? Visit Number 17   ? PT Start Time 1440   ? PT Stop Time 2694   ? PT Time Calculation (min) 29 min   ? Equipment Utilized During Treatment Gait belt   ? Activity Tolerance Patient tolerated treatment well   ? Behavior During Therapy [facility] for tasks assessed/performed   ? ?  ?  ? ?  ? ? ?Past Medical History:  ?Diagnosis Date  ? Abnormality of gait 09/07/2016  ? Allergy   ? Diabetes mellitus without complication ([place])   ? Patient denies this - notes history of glucose intolerance  ? GERD (gastroesophageal reflux disease)   ? Hemiparesis and alteration of sensations as late effects of stroke ([place]) 09/07/2016  ? History of pneumonia 1997  ? Hypertension   ? S/P MVR (mitral valve replacement)   ? Mechanical mitral valve replacement at age 37 (done in [place])  // echo 7/17: EF 55-60, normal wall motion, bileaflet mechanical mitral valve prosthesis functioning normally, mild LAE, mildly reduced RVSF, small pericardial effusion  ? Stroke [facility]) 1997, 2013, 2015  ? ? ?Past Surgical History:  ?Procedure Laterality Date  ? ABDOMINAL HYSTERECTOMY    ? BRAIN SURGERY    ? BUNIONECTOMY  1993  ? CARDIAC VALVE REPLACEMENT  1997  ? TOE SURGERY  1996  ? ? ?There were no vitals filed for this visit. ? ? Subjective Assessment - 03/18/22 1440   ? ? Subjective Patient reports continued severe night pain in her L side. She plans to follow up with her Drs at her upcoming appointments because it does not appear to be resolving.   ? Patient Stated Goals She would like a tune up, strenghening, balance, coordination, pain contorl.   ? Currently in Pain? Yes   ? Pain Score 2     ? Pain Location Leg   ? Pain Orientation Left;Distal;Proximal;Lateral   ? Pain Descriptors / Indicators Pressure   ? Pain Type Acute pain   ? Pain Onset 1 to 4 weeks ago   ? Pain Frequency Constant   ? ?  ?  ? ?  ? ? ? ? ? ? ? ? ? ? ? ? ? ? ? ? ? ? ? ? [place] Adult PT Treatment/Exercise - 03/18/22 0001   ? ?  ? Knee/Hip Exercises: Aerobic  ? Nustep L5 x 6 minutes, BLE only.   ?  ? Knee/Hip Exercises: Plyometrics  ? Other Plyometric Exercises Standing side stepping x 12 reps to each side.   ?  ? Knee/Hip Exercises: Supine  ? Other Supine Knee/Hip Exercises Turn to R, place both hands on the mat, walk hands out to side, shifting weight over BOS, then return to center, 5 x each direction.   ? Other Supine Knee/Hip Exercises Lean to R, lifting LLE up onto mat surface and return to ground x 10 reps.   ? ?  ?  ? ?  ? ? ? ? ? ? ? ? ? ? ? ? PT Short Term Goals - 02/25/22 1338   ? ?  ? PT SHORT TERM GOAL #1  ? Title I with basic  HEP   ? Baseline 4/20- doing daily   ? Time 3   ? Period Weeks   ? Status Achieved   ?  ? PT SHORT TERM GOAL #2  ? Title --   ? Baseline --   ? Time --   ? Period --   ? Status --   ? ?  ?  ? ?  ? ? ? ? PT Long Term Goals - 03/18/22 1512   ? ?  ? PT LONG TERM GOAL #1  ? Title I with final HEP   ? Time 1   ? Period Weeks   ? Status On-going   ? Target Date 04/13/22   ?  ? PT LONG TERM GOAL #2  ? Title Pt will improve TUG score to less than or equal to 13 seconds or less for pt to not be considered fall risk.   ? Baseline 4/20- 19.5 with SPC, unable to re-assess due to L hip pain.   ? Time 1   ? Period Weeks   ? Status On-going   ? Target Date 04/13/22   ?  ? PT LONG TERM GOAL #3  ? Title Improve 5 x STS time to <12 sec to demosntrate improved BLE strength.   ? Baseline 4/20- about the same, unable to re-assess due to L hip pain.   ? Time 1   ? Period Weeks   ? Status On-going   ? Target Date 04/13/22   ?  ? PT LONG TERM GOAL #4  ? Title Patient will score at least 46/56 on BERG Balance test to  demosntrate decreased fall risk.   ? Baseline 4/0- 41, unable to re-assess due to L hip pain.   ? Time 1   ? Period Weeks   ? Status On-going   ? Target Date 04/13/22   ?  ? PT LONG TERM GOAL #5  ? Title Patient will walk at least 200', on level and unlevel surface, with LRAD, [place].   ? Baseline 4/20- has a hard time walking over uneven surfaces like grass, don't make it as far as I'd like to  but level surfaces are ok, unable to re-assess due to L hip pain.   ? Time 1   ? Period Weeks   ? Status On-going   ? Target Date 04/13/22   ? ?  ?  ? ?  ? ? ? ? ? ? ? ? Plan - 03/18/22 1509   ? ? Clinical Impression Statement Patient reports her pain was much better after last treatment, until she had to get up to go to restroom in the middle of the night. she had shooting pain down her L leg. She has F/U apointment with Physiatrist on Tues before her next treatment. Plan is to see what he says about the pain. May plan D/C in next 1-2 visits. Treatment focused on balance and some strenghtening to lateral L leg, but avoided too much challenge to limb. also engaged trunk strengthening.   ? Personal Factors and Comorbidities Comorbidity 3+   ? 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 Examination-Activity Limitations Locomotion Level;Transfers;Stairs;Stand   ? Examination-Participation Restrictions Community Activity;Other   ? Stability/Clinical Decision Making Stable/Uncomplicated   ? Clinical Decision Making Moderate   ? Rehab Potential Good   ? PT Frequency 2x / week   ? PT Duration 3 weeks   ? PT Treatment/Interventions ADLs/Self Care Home Management;DME  Instruction;Gait training;Stair training;Functional mobility training;Therapeutic activities;Therapeutic exercise;Balance training;Neuromuscular re-education;Patient/family education;Passive range of motion;Orthotic Fit/Training;Electrical Stimulation;Moist Heat;Iontophoresis '4mg'$ /ml Dexamethasone;Dry needling;Manual techniques    ? PT Next Visit Plan What did the Dr say?   ? PT Home Exercise Plan Access Code: [email or web address]   ? Consulted and Agree with Plan of Care Patient   ? ?  ?  ? ?  ? ? ?Patient will benefit from skilled therapeutic intervention in order to improve the following deficits and impairments:  Abnormal gait, Difficulty walking, Decreased range of motion, Impaired tone, Pain, Decreased balance, Decreased mobility, Decreased strength, Postural dysfunction, Improper body mechanics, Increased muscle spasms ? ?Visit Diagnosis: ?Hemiplegia and hemiparesis following cerebral infarction affecting left non-dominant side ([place]) ? ?Other symptoms and signs involving the nervous system ? ?Other symptoms and signs involving the musculoskeletal system ? ?Muscle weakness (generalized) ? ?Other abnormalities of gait and mobility ? ?Unsteadiness on feet ? ?Abnormal posture ? ?Ataxia ? ? ? ? ?Problem List ?Patient Active Problem List  ? Diagnosis Date Noted  ? Goals of care, counseling/discussion   ? Palliative care by specialist   ? Acute CVA (cerebrovascular accident) ([place]) 07/01/2020  ? 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 Anticoagulated on warfarin   ? Left hemiparesis ([place])   ? History of CVA with residual deficit   ? Thrombocytopenia ([place])   ? Prediabetes   ? CVA (cerebral vascular accident) ([place]) 09/03/2019  ? Vertigo 09/24/2017  ? Benign paroxysmal positional vertigo   ? History of stroke   ? Hyperlipemia 08/23/2017  ? Encounter for therapeutic drug monitoring 08/08/2017  ? Long term (current) use of anticoagulants [Z79.01] 07/12/2017  ? Hemiparesis and alteration of sensations as late effects of stroke ([place]) 09/07/2016  ? Abnormality of gait 09/07/2016  ? HTN (hypertension) 04/13/2016  ? H/O mitral valve replacement with mechanical valve 10/16/2015  ? Diabetes mellitus without complication ([place])   ? Hemiparesis (L sided - mild) due to old stroke [facility])   ? ? ?Marcelina Morel, DPT ?03/18/2022, 3:27 PM ? ?[place] ?[address] ?[place], [place], [address] ?Phone: [phone]   Fax:  [phone] ? ?Name: [name] ?MRN: [id number] ?Date of Birth: 08/19/1946 ? ? ? ?

## 2022-03-18 NOTE — Therapy (Deleted)
[place] [address] ?[place], [place], [address] ?Phone: [phone]   Fax:  [phone] ? ?Physical Therapy Treatment ? ?Patient Details  ?Name: [name] ?MRN: [id number] ?Date of Birth: 05-01-1946 ?Referring Provider (PT): Dr Letta Pate ? ? ?Encounter Date: 03/18/2022 ? ? PT End of Session - 03/18/22 1458   ? ? Visit Number 17   ? PT Start Time 1425   ? PT Stop Time 5009   ? PT Time Calculation (min) 44 min   ? Equipment Utilized During Treatment Gait belt   ? Activity Tolerance Patient tolerated treatment well   ? Behavior During Therapy [facility] for tasks assessed/performed   ? ?  ?  ? ?  ? ? ?Past Medical History:  ?Diagnosis Date  ? Abnormality of gait 09/07/2016  ? Allergy   ? Diabetes mellitus without complication ([place])   ? Patient denies this - notes history of glucose intolerance  ? GERD (gastroesophageal reflux disease)   ? Hemiparesis and alteration of sensations as late effects of stroke ([place]) 09/07/2016  ? History of pneumonia 1997  ? Hypertension   ? S/P MVR (mitral valve replacement)   ? Mechanical mitral valve replacement at age 70 (done in [place])  // echo 7/17: EF 55-60, normal wall motion, bileaflet mechanical mitral valve prosthesis functioning normally, mild LAE, mildly reduced RVSF, small pericardial effusion  ? Stroke [facility]) 1997, 2013, 2015  ? ? ?Past Surgical History:  ?Procedure Laterality Date  ? ABDOMINAL HYSTERECTOMY    ? BRAIN SURGERY    ? BUNIONECTOMY  1993  ? CARDIAC VALVE REPLACEMENT  1997  ? TOE SURGERY  1996  ? ? ?There were no vitals filed for this visit. ? ? Subjective Assessment - 03/18/22 1440   ? ? Subjective Patient reports continued severe night pain in her L side. She plans to follow up with her Drs at her upcoming appointments because it does not appear to be resolving.   ? Patient Stated Goals She would like a tune up, strenghening, balance, coordination, pain contorl.   ? Currently in Pain? Yes   ? Pain Score 2     ? Pain Location Leg   ? Pain Orientation Left;Distal;Proximal;Lateral   ? Pain Descriptors / Indicators Pressure   ? Pain Type Acute pain   ? Pain Onset 1 to 4 weeks ago   ? Pain Frequency Constant   ? ?  ?  ? ?  ? ? ? ? ? ? ? ? ? ? ? ? ? ? ? ? ? ? ? ? [place] Adult PT Treatment/Exercise - 03/18/22 0001   ? ?  ? Knee/Hip Exercises: Aerobic  ? Nustep L5 x 6 minutes, BLE only.   ?  ? Knee/Hip Exercises: Plyometrics  ? Other Plyometric Exercises Standing side stepping x 12 reps to each side.   ?  ? Knee/Hip Exercises: Supine  ? Other Supine Knee/Hip Exercises Turn to R, place both hands on the mat, walk hands out to side, shifting weight over BOS, then return to center, 5 x each direction.   ? Other Supine Knee/Hip Exercises Lean to R, lifting LLE up onto mat surface and return to ground x 10 reps.   ? ?  ?  ? ?  ? ? ? ? ? ? ? ? ? ? ? ? PT Short Term Goals - 02/25/22 1338   ? ?  ? PT SHORT TERM GOAL #1  ? Title I with basic  HEP   ? Baseline 4/20- doing daily   ? Time 3   ? Period Weeks   ? Status Achieved   ?  ? PT SHORT TERM GOAL #2  ? Title --   ? Baseline --   ? Time --   ? Period --   ? Status --   ? ?  ?  ? ?  ? ? ? ? PT Long Term Goals - 03/18/22 1512   ? ?  ? PT LONG TERM GOAL #1  ? Title I with final HEP   ? Time 1   ? Period Weeks   ? Status On-going   ? Target Date 04/13/22   ?  ? PT LONG TERM GOAL #2  ? Title Pt will improve TUG score to less than or equal to 13 seconds or less for pt to not be considered fall risk.   ? Baseline 4/20- 19.5 with SPC, unable to re-assess due to L hip pain.   ? Time 1   ? Period Weeks   ? Status On-going   ? Target Date 04/13/22   ?  ? PT LONG TERM GOAL #3  ? Title Improve 5 x STS time to <12 sec to demosntrate improved BLE strength.   ? Baseline 4/20- about the same, unable to re-assess due to L hip pain.   ? Time 1   ? Period Weeks   ? Status On-going   ? Target Date 04/13/22   ?  ? PT LONG TERM GOAL #4  ? Title Patient will score at least 46/56 on BERG Balance test to  demosntrate decreased fall risk.   ? Baseline 4/0- 41, unable to re-assess due to L hip pain.   ? Time 1   ? Period Weeks   ? Status On-going   ? Target Date 04/13/22   ?  ? PT LONG TERM GOAL #5  ? Title Patient will walk at least 200', on level and unlevel surface, with LRAD, MI.   ? Baseline 4/20- has a hard time walking over uneven surfaces like grass, don't make it as far as I'd like to  but level surfaces are ok, unable to re-assess due to L hip pain.   ? Time 1   ? Period Weeks   ? Status On-going   ? Target Date 04/13/22   ? ?  ?  ? ?  ? ? ? ? ? ? ? ? Plan - 03/18/22 1509   ? ? Clinical Impression Statement Patient reports her pain was much better after last treatment, until she had to get up to go to restroom in the middle of the night. she had shooting pain down her L leg. She has F/U apointment with Physiatrist on Tues before her next treatment. Plan is to see what he says about the pain. May plan D/C in next 1-2 visits. Treatment focused on balance and some strenghtening to lateral L leg, but avoided too much challenge to limb. also engaged trunk strengthening.   ? Personal Factors and Comorbidities Comorbidity 3+   ? 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 Examination-Activity Limitations Locomotion Level;Transfers;Stairs;Stand   ? Examination-Participation Restrictions Community Activity;Other   ? Stability/Clinical Decision Making Stable/Uncomplicated   ? Clinical Decision Making Moderate   ? Rehab Potential Good   ? PT Frequency 2x / week   ? PT Duration 3 weeks   ? PT Treatment/Interventions ADLs/Self Care Home Management;DME  Instruction;Gait training;Stair training;Functional mobility training;Therapeutic activities;Therapeutic exercise;Balance training;Neuromuscular re-education;Patient/family education;Passive range of motion;Orthotic Fit/Training;Electrical Stimulation;Moist Heat;Iontophoresis '4mg'$ /ml Dexamethasone;Dry needling;Manual techniques    ? PT Next Visit Plan What did the Dr say?   ? PT Home Exercise Plan Access Code: [email or web address]   ? Consulted and Agree with Plan of Care Patient   ? ?  ?  ? ?  ? ? ?Patient will benefit from skilled therapeutic intervention in order to improve the following deficits and impairments:  Abnormal gait, Difficulty walking, Decreased range of motion, Impaired tone, Pain, Decreased balance, Decreased mobility, Decreased strength, Postural dysfunction, Improper body mechanics, Increased muscle spasms ? ?Visit Diagnosis: ?Hemiplegia and hemiparesis following cerebral infarction affecting left non-dominant side ([place]) ? ?Other symptoms and signs involving the nervous system ? ?Other symptoms and signs involving the musculoskeletal system ? ?Muscle weakness (generalized) ? ?Other abnormalities of gait and mobility ? ?Unsteadiness on feet ? ?Abnormal posture ? ?Ataxia ? ? ? ? ?Problem List ?Patient Active Problem List  ? Diagnosis Date Noted  ? Goals of care, counseling/discussion   ? Palliative care by specialist   ? Acute CVA (cerebrovascular accident) ([place]) 07/01/2020  ? 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 Anticoagulated on warfarin   ? Left hemiparesis ([place])   ? History of CVA with residual deficit   ? Thrombocytopenia ([place])   ? Prediabetes   ? CVA (cerebral vascular accident) ([place]) 09/03/2019  ? Vertigo 09/24/2017  ? Benign paroxysmal positional vertigo   ? History of stroke   ? Hyperlipemia 08/23/2017  ? Encounter for therapeutic drug monitoring 08/08/2017  ? Long term (current) use of anticoagulants [Z79.01] 07/12/2017  ? Hemiparesis and alteration of sensations as late effects of stroke ([place]) 09/07/2016  ? Abnormality of gait 09/07/2016  ? HTN (hypertension) 04/13/2016  ? H/O mitral valve replacement with mechanical valve 10/16/2015  ? Diabetes mellitus without complication ([place])   ? Hemiparesis (L sided - mild) due to old stroke [facility])   ? ? ?Marcelina Morel, DPT ?03/18/2022, 3:14 PM ? ? ?[place] ?[address] ?[place], [place], [address] ?Phone: [phone]   Fax:  [phone] ? ?Name: [name] ?MRN: [id number] ?Date of Birth: July 20, 1946 ? ? ? ?

## 2022-03-19 DIAGNOSIS — H5203 Hypermetropia, bilateral: Secondary | ICD-10-CM | POA: Diagnosis not present

## 2022-03-19 DIAGNOSIS — H2513 Age-related nuclear cataract, bilateral: Secondary | ICD-10-CM | POA: Diagnosis not present

## 2022-03-22 ENCOUNTER — Ambulatory Visit (INDEPENDENT_AMBULATORY_CARE_PROVIDER_SITE_OTHER): Payer: Medicare Other

## 2022-03-22 DIAGNOSIS — Z7901 Long term (current) use of anticoagulants: Secondary | ICD-10-CM

## 2022-03-22 DIAGNOSIS — Z5181 Encounter for therapeutic drug level monitoring: Secondary | ICD-10-CM

## 2022-03-22 DIAGNOSIS — Z952 Presence of prosthetic heart valve: Secondary | ICD-10-CM

## 2022-03-22 DIAGNOSIS — I69359 Hemiplegia and hemiparesis following cerebral infarction affecting unspecified side: Secondary | ICD-10-CM

## 2022-03-22 LAB — POCT INR: INR: 2.3 (ref 2.0–3.0)

## 2022-03-22 NOTE — Patient Instructions (Signed)
Description   ?Spoke with pt and instructed to take '10mg'$  (2 tablets) tonight then continue taking warfarin '5mg'$  daily except 7.'5mg'$  on Mondays.  ?Pt eats broccoli on Fridays.  ?Recheck INR in 1 week per INSURANCE. Coumadin Clinic [phone] ?  ?   ?

## 2022-03-23 ENCOUNTER — Ambulatory Visit: Payer: Medicare Other | Admitting: Physical Therapy

## 2022-03-23 ENCOUNTER — Encounter: Payer: Self-pay | Admitting: Physical Medicine & Rehabilitation

## 2022-03-23 ENCOUNTER — Telehealth: Payer: Self-pay | Admitting: Cardiovascular Disease

## 2022-03-23 ENCOUNTER — Encounter: Payer: Medicare Other | Attending: Physical Medicine & Rehabilitation | Admitting: Physical Medicine & Rehabilitation

## 2022-03-23 VITALS — BP 103/67 | HR 83 | Ht 67.0 in | Wt 124.4 lb

## 2022-03-23 DIAGNOSIS — M5416 Radiculopathy, lumbar region: Secondary | ICD-10-CM | POA: Insufficient documentation

## 2022-03-23 DIAGNOSIS — M48061 Spinal stenosis, lumbar region without neurogenic claudication: Secondary | ICD-10-CM | POA: Insufficient documentation

## 2022-03-23 DIAGNOSIS — I6300 Cerebral infarction due to thrombosis of unspecified precerebral artery: Secondary | ICD-10-CM

## 2022-03-23 MED ORDER — GABAPENTIN 100 MG PO CAPS: 100.0000 mg | ORAL_CAPSULE | Freq: Every day | ORAL | 1 refills | Status: DC

## 2022-03-23 NOTE — Progress Notes (Addendum)
Subjective:    Patient ID: [name], female    DOB: May 09, 1946, 76 y.o.   MRN: [id number] Levoscoliosis with chronic lumbar pain.  We discussed that the scoliosis is fixed and cannot be reversed and is likely the main factor with her back pain.  Also because of her left hemiparesis and use of AFO her gait is altered and this can reduce some additional physical stress on lumbar structures.  Therapy and stretching may be helpful for this HPI  Still very sore in the morning with left leg pain,  Poor sleep related to pain, takes tylenol every day for pain but only ~ 2tabs per day Some strengthening and stretching exercise, was helpful but has stopped doing this due to increasing Left leg down to the left calf during ambulation.  Pain does get relieved with sitting for 2-3 min  Has pain going down the LLE , no new numbness or tingling Pain Inventory Average Pain 5 Pain Right Now 3 My pain is sharp, burning, dull, and stabbing  LOCATION OF PAIN  back, hip, thigh, leg  BOWEL Number of stools per week: varies Oral laxative use Yes  Type of laxative Colace Enema or suppository use No  History of colostomy No  Incontinent No   BLADDER Normal In and out cath, frequency . Able to self cath  . Bladder incontinence No  Frequent urination No  Leakage with coughing No  Difficulty starting stream No  Incomplete bladder emptying No    Mobility use a cane how many minutes can you walk? depends ability to climb steps?  yes do you drive?  no  Function retired I need assistance with the following:  shopping  Neuro/Psych weakness trouble walking dizziness  Prior Studies Any changes since last visit?  no  Physicians involved in your care Any changes since last visit?  no   Family History  Problem Relation Age of Onset   Cancer Mother        Bone   Heart disease Mother    Hyperlipidemia Mother    Hypertension Mother    Stroke Father    Hypertension Father    Heart  attack Neg Hx    Social History   Socioeconomic History   Marital status: Married    Spouse name: Not on file   Number of children: 2   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   Highest education level: Not on file  Occupational History   Occupation: Retired  Tobacco Use   Smoking status: Never    Passive exposure: Never   Smokeless tobacco: Never  Vaping Use   Vaping Use: Never used  Substance and Sexual Activity   Alcohol use: No    Alcohol/week: 0.0 standard drinks   Drug use: No   Sexual activity: Not on file    Comment: Married  Other Topics Concern   Not on file  Social History Narrative   Lives at home w/ her husband   Right-handed   Caffeine: none   Social Determinants of Radio broadcast assistant Strain: Not on file  Food Insecurity: Not on file  Transportation Needs: Not on file  Physical Activity: Not on file  Stress: Not on file  Social Connections: Not on file   Past Surgical History:  Procedure Laterality Date   [place]  Past Medical History:  Diagnosis Date   Abnormality of gait 09/07/2016   Allergy    Diabetes mellitus without complication [facility])    Patient denies this - notes history of glucose intolerance   GERD (gastroesophageal reflux disease)    Hemiparesis and alteration of sensations as late effects of stroke ([place]) 09/07/2016   History of pneumonia 1997   Hypertension    S/P MVR (mitral valve replacement)    Mechanical mitral valve replacement at age 65 (done in [place])  // echo 7/17: EF 55-60, normal wall motion, bileaflet mechanical mitral valve prosthesis functioning normally, mild LAE, mildly reduced RVSF, small pericardial effusion   Stroke ([place]) 1997, 2013, 2015   BP 103/67   Pulse 83   Ht '5\' 7"'$  (1.702 m)   Wt 124 lb 6.4 oz (56.4 kg)   SpO2 95%   BMI 19.48 kg/m   Opioid Risk Score:   Fall Risk Score:   `1  Depression screen [facility] 2/9     03/23/2022   11:06 AM 12/29/2021    9:21 AM 06/09/2021    2:01 PM 09/12/2020    1:32 PM 07/29/2020    1:54 PM 07/18/2020    1:15 PM 10/22/2019    5:11 PM  Depression screen PHQ 2/9  Decreased Interest 0 1 0 0 0 0 0  Down, Depressed, Hopeless 1 1 0 0 0 0 0  PHQ - 2 Score 1 2 0 0 0 0 0  Altered sleeping     1    Tired, decreased energy     0    Change in appetite     0    Feeling bad or failure about yourself      0    Trouble concentrating     0    Moving slowly or fidgety/restless     0    Suicidal thoughts     0    PHQ-9 Score     1       Review of Systems  Musculoskeletal:  Positive for back pain and gait problem.       Leg pain  Neurological:  Positive for dizziness and weakness.  All other systems reviewed and are negative.     Objective:   Physical Exam Neurological:     Mental Status: She is oriented to person, place, and time.     Cranial Nerves: No dysarthria or facial asymmetry.     Sensory: Sensation is intact.     Motor: Weakness and abnormal muscle tone present.     Coordination: Impaired rapid alternating movements.     Gait: Gait abnormal.     Comments: Motor strength is 5/5 in the right deltoid bicep tricep grip hip flexor knee extensor ankle dorsiflexor 4+ in the left deltoid bicep tricep grip 4/5 in the left hip flexor and knee extensor, ankle dorsiflexion not tested due to AFO. Ambulates with large-base quad cane leg left AFO no evidence of toe drag or knee instability with brace on. Negative straight leg raising on the left side. DTR 3+ on the left 2+ on the right at the patellar.  MSK Tenderness palpation lumbar paraspinal area worsens particularly on the left side with extension.  She has minimal lumbar extension minimal lumbar lateral bending either to the right or to the left side Mild levoscoliosis          Assessment & Plan:   1.  History of CVA with chronic left hemiparesis, the patient has had no  change  in her neurologic status.  2.  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  We will check lumbar noncontrast MRI given that she has had no improvement after 2 months of physical therapy.  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

## 2022-03-23 NOTE — Telephone Encounter (Signed)
Spoke to [employer], Rph but apparently the caller hung up before she could get to the phone. Will route to coumadin clinic to make aware so that they can attempt to call back and address the INR. ?

## 2022-03-23 NOTE — Telephone Encounter (Signed)
While trying to transfer call to coumadin clinic MD INR disconnected call.  ?

## 2022-03-23 NOTE — Patient Instructions (Signed)
May take up to 3 tylenol per day ?Start gabapentin at night ?Cont home ex program  ?

## 2022-03-23 NOTE — Telephone Encounter (Signed)
INR results was addressed on yesterday, 03/22/2022, please refer to Anticoagulation Encounter regarding details. Will await a call back from the caller if they need to speak to the Clinic.  ?

## 2022-03-23 NOTE — Telephone Encounter (Signed)
[name] is calling to report a critical INR. Transferred call to triage.  ?

## 2022-03-24 ENCOUNTER — Ambulatory Visit (HOSPITAL_BASED_OUTPATIENT_CLINIC_OR_DEPARTMENT_OTHER)
Admission: RE | Admit: 2022-03-24 | Discharge: 2022-03-24 | Disposition: A | Discharge status: Home / Self Care | Payer: Medicare Other | Source: Ambulatory Visit | Attending: Physical Medicine & Rehabilitation | Admitting: Physical Medicine & Rehabilitation

## 2022-03-24 DIAGNOSIS — M5416 Radiculopathy, lumbar region: Secondary | ICD-10-CM | POA: Insufficient documentation

## 2022-03-24 DIAGNOSIS — M48061 Spinal stenosis, lumbar region without neurogenic claudication: Secondary | ICD-10-CM | POA: Diagnosis not present

## 2022-03-24 DIAGNOSIS — M4316 Spondylolisthesis, lumbar region: Secondary | ICD-10-CM | POA: Diagnosis not present

## 2022-03-24 DIAGNOSIS — M5126 Other intervertebral disc displacement, lumbar region: Secondary | ICD-10-CM | POA: Diagnosis not present

## 2022-03-25 ENCOUNTER — Ambulatory Visit: Payer: Medicare Other | Admitting: Physical Therapy

## 2022-03-25 ENCOUNTER — Other Ambulatory Visit: Payer: Self-pay | Admitting: Cardiovascular Disease

## 2022-03-25 DIAGNOSIS — I6300 Cerebral infarction due to thrombosis of unspecified precerebral artery: Secondary | ICD-10-CM

## 2022-03-25 DIAGNOSIS — Z5181 Encounter for therapeutic drug level monitoring: Secondary | ICD-10-CM

## 2022-03-25 NOTE — Telephone Encounter (Signed)
Prescription refill request received for warfarin Lov: 12/23/21 ([facility])  Next INR check: 03/29/22 Warfarin tablet strength: '5mg'$   Appropriate dose and refill sent to requested pharmacy.

## 2022-03-26 ENCOUNTER — Telehealth: Payer: Self-pay | Admitting: *Deleted

## 2022-03-26 NOTE — Telephone Encounter (Addendum)
I have given information to [name].  How long would she need to be off the coumadin? Our current request to hold coumadin form says 3 days that needs to be faxed to Dr Acie Fredrickson.Marland Kitchen   ----- Message from Charlett Blake, MD sent at 03/25/2022  3:12 PM EDT ----- May benefit from Left L4-5 ESI if she can come off anticoag

## 2022-03-26 NOTE — Telephone Encounter (Signed)
Will route to pharm for review then plan VV. Last OV 12/2021. Pharm team - see specific request about checking INR in request notes.

## 2022-03-26 NOTE — Telephone Encounter (Signed)
I have faxed the request to hold warfarin to Dr Elmarie Shiley office (for 7 days per Dr Letta Pate). If ok'd we can look at scheduling the ESI.

## 2022-03-26 NOTE — Telephone Encounter (Signed)
   Pre-operative Risk Assessment    Patient Name: [name]  DOB: 17-Feb-1946 MRN: [id number]      Request for Surgical Clearance    Procedure:   EPIDURAL SPINAL INJECTION  Date of Surgery:  Clearance TBD                                 Surgeon:  DR. Alysia Penna Surgeon's Group or Practice Name:  [facility] Phone number:  [phone] Fax number:  [phone] ATTN: Wenda Overland, RN   Type of Clearance Requested:   - Medical  - Pharmacy:  Hold Warfarin (Coumadin) x 7 DAYS PRIOR TO INJECTION : SIDE NOTES: IF PT IS COUMADIN, A PT/INR MUST BE CHECKED WITHIN 24 HOURS OF THE APPT WITH THE RESULTS FAXED TO THE OFFICE OR SENT WITH THE PT. ANTICOAGULATION MEDICATION MAY BE RESUMED THE EVENING OF THE INJECTION   Type of Anesthesia:  Not Indicated   Additional requests/questions:    Jiles Prows   03/26/2022, 3:00 PM

## 2022-03-29 ENCOUNTER — Telehealth: Payer: Self-pay | Admitting: *Deleted

## 2022-03-29 ENCOUNTER — Ambulatory Visit (INDEPENDENT_AMBULATORY_CARE_PROVIDER_SITE_OTHER): Payer: Medicare Other | Admitting: Pharmacist

## 2022-03-29 DIAGNOSIS — Z5181 Encounter for therapeutic drug level monitoring: Secondary | ICD-10-CM

## 2022-03-29 DIAGNOSIS — Z7901 Long term (current) use of anticoagulants: Secondary | ICD-10-CM | POA: Diagnosis not present

## 2022-03-29 DIAGNOSIS — Z952 Presence of prosthetic heart valve: Secondary | ICD-10-CM | POA: Diagnosis not present

## 2022-03-29 DIAGNOSIS — I69359 Hemiplegia and hemiparesis following cerebral infarction affecting unspecified side: Secondary | ICD-10-CM

## 2022-03-29 LAB — POCT INR: INR: 4.6 — AB (ref 2.0–3.0)

## 2022-03-29 NOTE — Telephone Encounter (Signed)
Primary Cardiologist:Philip Nahser, MD  Chart reviewed as part of pre-operative protocol coverage. Because of Caron A Virella's past medical history and time since last visit, he/she will require a virtual visit/telephone call in order to better assess preoperative cardiovascular risk.  Pre-op covering staff: - Please contact patient, obtain consent, and schedule appointment   If applicable, this message will also be routed to pharmacy pool and/or primary cardiologist for input on holding anticoagulant/antiplatelet agent as requested below so that this information is available at time of patient's appointment.    Emmaline Life, NP-C    03/29/2022, 7:52 AM [place] [address] Office [phone] Fax [phone]

## 2022-03-29 NOTE — Telephone Encounter (Signed)
   Pt is calling to schedule her tele visit. Phone # [phone]

## 2022-03-29 NOTE — Telephone Encounter (Signed)
Pt agreeable to tele pre op appt 03/30/22 @ 1 pm. Pt was not able to go over her medications today as she did not have her med list with her. I assured that the provider can go over the medications tomorrow when she calls her.

## 2022-03-29 NOTE — Telephone Encounter (Signed)
Patient with diagnosis of mechanical mitral valve and stroke on warfarin for anticoagulation.    Procedure: EPIDURAL SPINAL INJECTION Date of procedure: TBD  CrCl 54 ml/min Platelet count 157  Per office protocol, patient can hold warfarin for 5 days prior to procedure.    Patient WILL need bridging with Lovenox (enoxaparin) around procedure.  Patient is a Personal assistant. She will need to advise coumadin clinic when procedure is scheduled so that lovenox bridge can be coordinated.  Providing INR within 24 hr of procedure will not be an issue as patient checks INR at home and reports it to the company and to [place].

## 2022-03-29 NOTE — Telephone Encounter (Signed)
Pt agreeable to tele pre op appt 03/30/22 @ 1 pm. Pt was not able to go over her medications today as she did not have her med list with her. I assured that the provider can go over the medications tomorrow when she calls her at 1 pm. Consent has been given though.   Patient Consent for Virtual Visit        [name] has provided verbal consent on 03/29/2022 for a virtual visit (video or telephone).   CONSENT FOR VIRTUAL VISIT FOR:  [name]  By participating in this virtual visit I agree to the following:  I hereby voluntarily request, consent and authorize [facility] and its employed or contracted physicians, physician assistants, nurse practitioners or other licensed health care professionals (the Practitioner), to provide me with telemedicine health care services (the "Services") as deemed necessary by the treating Practitioner. I acknowledge and consent to receive the Services by the Practitioner via telemedicine. I understand that the telemedicine visit will involve communicating with the Practitioner through live audiovisual communication technology and the disclosure of certain medical information by electronic transmission. I acknowledge that I have been given the opportunity to request an in-person assessment or other available alternative prior to the telemedicine visit and am voluntarily participating in the telemedicine visit.  I understand that I have the right to withhold or withdraw my consent to the use of telemedicine in the course of my care at any time, without affecting my right to future care or treatment, and that the Practitioner or I may terminate the telemedicine visit at any time. I understand that I have the right to inspect all information obtained and/or recorded in the course of the telemedicine visit and may receive copies of available information for a reasonable fee.  I understand that some of the potential risks of receiving the Services via telemedicine  include:  Delay or interruption in medical evaluation due to technological equipment failure or disruption; Information transmitted may not be sufficient (e.g. poor resolution of images) to allow for appropriate medical decision making by the Practitioner; and/or  In rare instances, security protocols could fail, causing a breach of personal health information.  Furthermore, I acknowledge that it is my responsibility to provide information about my medical history, conditions and care that is complete and accurate to the best of my ability. I acknowledge that Practitioner's advice, recommendations, and/or decision may be based on factors not within their control, such as incomplete or inaccurate data provided by me or distortions of diagnostic images or specimens that may result from electronic transmissions. I understand that the practice of medicine is not an exact science and that Practitioner makes no warranties or guarantees regarding treatment outcomes. I acknowledge that a copy of this consent can be made available to me via my patient portal ([facility]), or I can request a printed copy by calling the office of [facility].    I understand that my insurance will be billed for this visit.   I have read or had this consent read to me. I understand the contents of this consent, which adequately explains the benefits and risks of the Services being provided via telemedicine.  I have been provided ample opportunity to ask questions regarding this consent and the Services and have had my questions answered to my satisfaction. I give my informed consent for the services to be provided through the use of telemedicine in my medical care

## 2022-03-30 ENCOUNTER — Ambulatory Visit (INDEPENDENT_AMBULATORY_CARE_PROVIDER_SITE_OTHER): Payer: Medicare Other | Admitting: Nurse Practitioner

## 2022-03-30 DIAGNOSIS — Z01810 Encounter for preprocedural cardiovascular examination: Secondary | ICD-10-CM | POA: Diagnosis not present

## 2022-03-30 NOTE — Progress Notes (Signed)
Virtual Visit via Telephone Note   Because of [name]'s co-morbid illnesses, she is at least at moderate risk for complications without adequate follow up.  This format is felt to be most appropriate for this patient at this time.  The patient did not have access to video technology/had technical difficulties with video requiring transitioning to audio format only (telephone).  All issues noted in this document were discussed and addressed.  No physical exam could be performed with this format.  Please refer to the patient's chart for her consent to telehealth for [facility].  Evaluation Performed:  Preoperative cardiovascular risk assessment _____________   Date:  03/30/2022   Patient ID:  [name], DOB 1946/02/16, MRN [id number] Patient Location:  Home Provider location:   Office  Primary Care Provider:  Haywood Pao, MD Primary Cardiologist:  Mertie Moores, MD  Chief Complaint / Patient Profile   76 y.o. y/o female with a h/o mitral valve replacement on chronic anticoagulation, hypertension, multiple strokes, hyperlipidemia, frequent falls who is pending epidural spinal injection and presents today for telephonic preoperative cardiovascular risk assessment.  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 ([place]) 09/07/2016   History of pneumonia 1997   Hypertension    S/P MVR (mitral valve replacement)    Mechanical mitral valve replacement at age 53 (done in [place])  // echo 7/17: EF 55-60, normal wall motion, bileaflet mechanical mitral valve prosthesis functioning normally, mild LAE, mildly reduced RVSF, small pericardial effusion   Stroke ([place]) 1997, 2013, 2015   Past  Surgical History:  Procedure Laterality Date   ABDOMINAL HYSTERECTOMY     [place]    Allergies  Allergies  Allergen Reactions   Zoloft [Sertraline] Hives, Itching and Rash    History of Present Illness    [name] is a 76 y.o. female who presents via audio/video conferencing for a telehealth visit today.  Pt was last seen in cardiology clinic on 12/23/21 by Dr. Acie Fredrickson.  At that time [name] was doing well.  The patient is now pending procedure as outlined above. Since her last visit, she denies chest pain, shortness of breath, lower extremity edema, fatigue, palpitations, melena, hematuria, hemoptysis, diaphoresis, presyncope, syncope, orthopnea, and PND. She does have weakness and gait abnormality due to recent back and leg pain.    Home Medications    Prior to Admission medications   Medication Sig Start Date End Date Taking? Authorizing Provider  acetaminophen (TYLENOL) 325 MG tablet Take 2 tablets (650 mg total) by mouth every 4 (four) hours as needed for mild pain (or temp > 37.5 C (99.5 F)). 09/14/19   Angiulli, Lavon Paganini, PA-C  amLODipine (NORVASC) 5 MG tablet Take 1 tablet (5 mg total) by mouth at bedtime. 07/15/20   Angiulli, Lavon Paganini, PA-C  aspirin EC 81 MG tablet Take 81 mg by mouth daily.     [provider]  atorvastatin (LIPITOR) 40 MG tablet TAKE 1 TABLET(40 MG) BY MOUTH DAILY 03/09/22   Nahser, Wonda Cheng, MD  CALCIUM PO Take 1,200  mg by mouth daily.    [provider]  Cholecalciferol (VITAMIN D3 PO) Take 1 tablet by mouth daily.    [provider]  denosumab (PROLIA) 60 MG/ML SOSY injection Inject 60 mg into the skin every 6 (six) months.    [provider]  docusate sodium (COLACE) 100 MG capsule Take 100 mg at bedtime by mouth.     [provider]  gabapentin (NEURONTIN) 100 MG capsule Take 1 capsule (100 mg total) by mouth at  bedtime. 03/23/22   Kirsteins, Luanna Salk, MD  Menthol, Topical Analgesic, (BENGAY EX) Apply 1 application topically daily. All painful areas    [provider]  Mouthwashes (BIOTENE DRY MOUTH MT) Use as directed 1 spray in the mouth or throat as needed (dry mouth).    [provider]  omeprazole (PRILOSEC) 20 MG capsule Take 20 mg by mouth daily.    [provider]  Propylene Glycol-Glycerin (SOOTHE) 0.6-0.6 % SOLN Place 1 drop into both eyes 2 (two) times daily as needed (dry eyes).    [provider]  warfarin (COUMADIN) 5 MG tablet Take Warfarin, '5mg'$  daily except 7.'5mg'$  on Mondays or as directed by Coumadin Clinic 03/25/22   Nahser, Wonda Cheng, MD    Physical Exam    Vital Signs:  [name] does not have vital signs available for review today.  Given telephonic nature of communication, physical exam is limited. AAOx3. NAD. Normal affect.  Speech and respirations are unlabored.  Accessory Clinical Findings    None  Assessment & Plan    1.  Preoperative Cardiovascular Risk Assessment: She is doing well from a cardiac perspective and may proceed with her procedure without further cardiac testing. According to the Revised Cardiac Risk Index (RCRI), her Perioperative Risk of Major Cardiac Event is (%): 0.9. Her Functional Capacity in METs is: 4.64 according to the Duke Activity Status Index (DASI).  Per office protocol, patient can hold warfarin for 5 days prior to procedure.  Patient WILL need bridging with Lovenox (enoxaparin) around procedure. Patient is a Personal assistant. She will need to advise coumadin clinic when procedure is scheduled so that lovenox bridge can be coordinated.  Providing INR within 24 hr of procedure will not be an issue as patient checks INR at home and reports it to the company and to [place]. Patient verbalized understanding.    A copy of this note will be routed to requesting surgeon.  Time:   Today, I have spent 10 minutes with  the patient with telehealth technology discussing medical history, symptoms, and management plan.    Emmaline Life, NP-C    03/30/2022, 1:20 PM [place] [address], [address] Office [phone] Fax [phone]

## 2022-03-31 ENCOUNTER — Telehealth: Payer: Self-pay | Admitting: *Deleted

## 2022-03-31 ENCOUNTER — Telehealth: Payer: Self-pay | Admitting: Nurse Practitioner

## 2022-03-31 NOTE — Telephone Encounter (Signed)
New Message:    Patient said she had a visit with [facility] yesterday. She says she needs to talk to her about the scheduling of her procedure, that they discussed yesterday(03-30-22).

## 2022-03-31 NOTE — Telephone Encounter (Signed)
[name] called about instructions for her blood thinner and when she can have back injection. Her appt is a follow up on 04/29/22. We have received the fax back from Dr Elmarie Shiley office and it is in your inbox. She will need to be bridged with Lovenox for 5 days and they are waiting for [place] to given them an appointment date for the injection.

## 2022-03-31 NOTE — Telephone Encounter (Signed)
I spoke with patient who states she called Dr Letta Pate office to schedule procedure and was not able to get this scheduled.  Patient has follow up appointment in June with Dr Letta Pate office but she is not sure if procedure can be scheduled prior to this appointment.  Clearance request located in chart and it is to be returned to RN in office.  I asked patient to contact Dr Letta Pate office and ask to to speak with RN to clarify.  Patient will contact our office to let us know when procedure is scheduled so bridging can be arranged with coumadin clinic

## 2022-04-02 ENCOUNTER — Telehealth: Payer: Self-pay | Admitting: Cardiovascular Disease

## 2022-04-02 NOTE — Telephone Encounter (Signed)
Addressed already in duplicate encounter from 5/24.

## 2022-04-02 NOTE — Telephone Encounter (Signed)
Procedure date noted. Pt is a Coumadin self-tester with next INR due on Monday. Procedure date has been added to anticoag note so that bridge can be coordinated ~1 week prior to procedure.

## 2022-04-02 NOTE — Telephone Encounter (Signed)
Faxed received today with the procedure now listed as 04/29/22. See ov note from Christen Bame, NP. I will update the NP and the pharm-d as it seems the pt is going to need Lovenox bridge.

## 2022-04-02 NOTE — Telephone Encounter (Signed)
Patient called and wanted to let Dr. Acie Fredrickson know that her date of procedure with Dr. Ella Bodo is 04/29/22 at 11:15 am.

## 2022-04-02 NOTE — Telephone Encounter (Signed)
Per OV note on 03/30/22 by Christen Bame (pre-op clearance visit):  Patient is a self tester. She will need to advise coumadin clinic when procedure is scheduled so that lovenox bridge can be coordinated.  Providing INR within 24 hr of procedure will not be an issue as patient checks INR at home and reports it to the company and to [place].  Will route to Coumadin clinic and Christen Bame to address Lovenox bridging.

## 2022-04-02 NOTE — Telephone Encounter (Signed)
I spoke with [name] and informed her we had approval from Dr Acie Fredrickson for her to come off her blood thinner for 5 days supplementing with Lovenox during that time. She will hold the Lovenox the day of the injection and do an INR and report results to [place]. She will need a driver for the procedure 04/29/22 L4-5 ESI. She understands and her cardiologist will contact her about her coumadin/lovenox orders.

## 2022-04-06 ENCOUNTER — Telehealth: Payer: Self-pay | Admitting: Cardiovascular Disease

## 2022-04-06 ENCOUNTER — Ambulatory Visit (INDEPENDENT_AMBULATORY_CARE_PROVIDER_SITE_OTHER): Payer: Medicare Other | Admitting: Cardiology

## 2022-04-06 DIAGNOSIS — Z5181 Encounter for therapeutic drug level monitoring: Secondary | ICD-10-CM

## 2022-04-06 LAB — POCT INR: INR: 4.1 — AB (ref 2.0–3.0)

## 2022-04-06 NOTE — Telephone Encounter (Signed)
Called and spoke to pt. Please see anti-coag encounter from today for further documentation.

## 2022-04-06 NOTE — Telephone Encounter (Signed)
Pt c/o medication issue:  1. Name of Medication:   warfarin (COUMADIN) 5 MG tablet    2. How are you currently taking this medication (dosage and times per day)? Take Warfarin, '5mg'$  daily except 7.'5mg'$  on Mondays or as directed by Coumadin Clinic  3. Are you having a reaction (difficulty breathing--STAT)? No  4. What is your medication issue?  Pt would like to know when she needs to stop taking medication for procedure that is scheduled for 04/29/22. Pt would also like to know how many days will she have to take Lovenox injection. Please advise

## 2022-04-06 NOTE — Patient Instructions (Signed)
Description   Spoke with pt and instructed her to take 1/2 a tablet of warfarin today and then continue taking warfarin '5mg'$  daily except 7.'5mg'$  on Mondays. **Pt to have ESI on 6/22 - will need bridge with BID Lovenox dosing - pt aware.** Pt is coming in for bridge instructions on 6/12.  Pt eats broccoli on Fridays.  Recheck INR in 1 week per INSURANCE. Coumadin Clinic [phone]

## 2022-04-08 DIAGNOSIS — H5310 Unspecified subjective visual disturbances: Secondary | ICD-10-CM | POA: Diagnosis not present

## 2022-04-08 DIAGNOSIS — H2513 Age-related nuclear cataract, bilateral: Secondary | ICD-10-CM | POA: Diagnosis not present

## 2022-04-12 ENCOUNTER — Ambulatory Visit (INDEPENDENT_AMBULATORY_CARE_PROVIDER_SITE_OTHER): Payer: Medicare Other

## 2022-04-12 DIAGNOSIS — Z7901 Long term (current) use of anticoagulants: Secondary | ICD-10-CM

## 2022-04-12 DIAGNOSIS — Z5181 Encounter for therapeutic drug level monitoring: Secondary | ICD-10-CM | POA: Diagnosis not present

## 2022-04-12 LAB — POCT INR: INR: 3.1 — AB (ref 2.0–3.0)

## 2022-04-12 NOTE — Patient Instructions (Signed)
Description   Continue taking warfarin '5mg'$  daily except 7.'5mg'$  on Mondays. **Pt to have ESI on 6/22 - will need bridge with BID Lovenox dosing - pt aware.** Pt is coming in for bridge instructions on 6/12.  Pt eats broccoli on Fridays.  Recheck INR in 1 week per INSURANCE. Coumadin Clinic [phone]

## 2022-04-15 ENCOUNTER — Telehealth: Payer: Self-pay | Admitting: *Deleted

## 2022-04-15 NOTE — Telephone Encounter (Signed)
Pt called to report that she missed all her meds last night. She states she normally takes her meds at 8pm or 9pm. Advised that her Warfarin is safe to take at this since she has a 12 hour window to take, she verbalized understanding and was thankful for the assistance. Advised that she should follow up with the other prescribers regarding the other medications, she states her PCP prescribes and manages the others. Advised to call back if needed or has any other questions.

## 2022-04-18 ENCOUNTER — Other Ambulatory Visit: Payer: Self-pay | Admitting: Physical Medicine & Rehabilitation

## 2022-04-19 ENCOUNTER — Ambulatory Visit (INDEPENDENT_AMBULATORY_CARE_PROVIDER_SITE_OTHER): Payer: Medicare Other | Admitting: *Deleted

## 2022-04-19 ENCOUNTER — Ambulatory Visit (INDEPENDENT_AMBULATORY_CARE_PROVIDER_SITE_OTHER): Payer: Medicare Other

## 2022-04-19 DIAGNOSIS — Z7901 Long term (current) use of anticoagulants: Secondary | ICD-10-CM

## 2022-04-19 DIAGNOSIS — Z5181 Encounter for therapeutic drug level monitoring: Secondary | ICD-10-CM

## 2022-04-19 DIAGNOSIS — I69359 Hemiplegia and hemiparesis following cerebral infarction affecting unspecified side: Secondary | ICD-10-CM

## 2022-04-19 DIAGNOSIS — Z952 Presence of prosthetic heart valve: Secondary | ICD-10-CM | POA: Diagnosis not present

## 2022-04-19 LAB — POCT INR
INR: 3.5 — AB (ref 2.0–3.0)
INR: 3.5 — AB (ref 2.0–3.0)

## 2022-04-19 MED ORDER — ENOXAPARIN SODIUM 60 MG/0.6ML IJ SOSY: 60.0000 mg | PREFILLED_SYRINGE | Freq: Two times a day (BID) | INTRAMUSCULAR | 1 refills | Status: DC

## 2022-04-19 NOTE — Patient Instructions (Addendum)
Description   Continue taking warfarin '5mg'$  daily except 7.'5mg'$  on Mondays until 04/23/22, then follow procedure dosing instructions. Pt eats broccoli on Fridays. Recheck INR in 1 week per INSURANCE. Coumadin Clinic [phone]     6/16: Last dose of warfarin.  6/17: No warfarin or enoxaparin (Lovenox).  6/18: Inject enoxaparin '60mg'$  in the fatty abdominal tissue at least 2 inches from the belly button twice a day about 12 hours apart, 8am and 8pm rotate sites. No warfarin.  6/19: Inject enoxaparin in the fatty tissue every 12 hours, 8am and 8pm. No warfarin.  6/20: Inject enoxaparin in the fatty tissue every 12 hours, 8am and 8pm. No warfarin.  6/21: Inject enoxaparin in the fatty tissue in the morning at 8 am (No PM dose). No warfarin.  6/22: Procedure Day - No enoxaparin - Resume warfarin in the evening or as directed by doctor (resume normal dose).  6/23: Resume enoxaparin inject in the fatty tissue every 12 hours and take warfarin  6/24: Inject enoxaparin in the fatty tissue every 12 hours and take warfarin  6/25: Inject enoxaparin in the fatty tissue every 12 hours and take warfarin  6/26: Inject enoxaparin in the fatty tissue every 12 hours and take warfarin  6/27: Inject enoxaparin in the fatty tissue every 12 hours and take warfarin  6/28: warfarin appt to check INR at home.

## 2022-04-19 NOTE — Patient Instructions (Addendum)
6/16: Last dose of warfarin.  6/17: No warfarin or enoxaparin (Lovenox).  6/18: Inject enoxaparin '60mg'$  in the fatty abdominal tissue at least 2 inches from the belly button twice a day about 12 hours apart, 8am and 8pm rotate sites. No warfarin.  6/19: Inject enoxaparin in the fatty tissue every 12 hours, 8am and 8pm. No warfarin.  6/20: Inject enoxaparin in the fatty tissue every 12 hours, 8am and 8pm. No warfarin.  6/21: Inject enoxaparin in the fatty tissue in the morning at 8 am (No PM dose). No warfarin.  6/22: Procedure Day - No enoxaparin - Resume warfarin in the evening or as directed by doctor (resume normal dose).  6/23: Resume enoxaparin inject in the fatty tissue every 12 hours and take warfarin  6/24: Inject enoxaparin in the fatty tissue every 12 hours and take warfarin  6/25: Inject enoxaparin in the fatty tissue every 12 hours and take warfarin  6/26: Inject enoxaparin in the fatty tissue every 12 hours and take warfarin  6/27: Inject enoxaparin in the fatty tissue every 12 hours and take warfarin  6/28: warfarin appt to check INR at home.

## 2022-04-26 ENCOUNTER — Other Ambulatory Visit: Payer: Self-pay

## 2022-04-26 ENCOUNTER — Inpatient Hospital Stay (HOSPITAL_COMMUNITY)
Admission: EM | Admit: 2022-04-26 | Discharge: 2022-04-30 | DRG: 065 | Disposition: A | Discharge status: Rehabilitation Facility | Payer: Medicare Other | Attending: Internal Medicine | Admitting: Internal Medicine

## 2022-04-26 ENCOUNTER — Emergency Department (HOSPITAL_COMMUNITY): Payer: Medicare Other

## 2022-04-26 DIAGNOSIS — K5900 Constipation, unspecified: Secondary | ICD-10-CM | POA: Diagnosis not present

## 2022-04-26 DIAGNOSIS — T45515A Adverse effect of anticoagulants, initial encounter: Secondary | ICD-10-CM | POA: Diagnosis not present

## 2022-04-26 DIAGNOSIS — I959 Hypotension, unspecified: Secondary | ICD-10-CM | POA: Diagnosis not present

## 2022-04-26 DIAGNOSIS — Z8249 Family history of ischemic heart disease and other diseases of the circulatory system: Secondary | ICD-10-CM | POA: Diagnosis not present

## 2022-04-26 DIAGNOSIS — R29703 NIHSS score 3: Secondary | ICD-10-CM | POA: Diagnosis not present

## 2022-04-26 DIAGNOSIS — I4891 Unspecified atrial fibrillation: Secondary | ICD-10-CM | POA: Diagnosis not present

## 2022-04-26 DIAGNOSIS — D649 Anemia, unspecified: Secondary | ICD-10-CM | POA: Diagnosis not present

## 2022-04-26 DIAGNOSIS — R531 Weakness: Secondary | ICD-10-CM | POA: Diagnosis not present

## 2022-04-26 DIAGNOSIS — Z83438 Family history of other disorder of lipoprotein metabolism and other lipidemia: Secondary | ICD-10-CM | POA: Diagnosis not present

## 2022-04-26 DIAGNOSIS — Z7982 Long term (current) use of aspirin: Secondary | ICD-10-CM

## 2022-04-26 DIAGNOSIS — M545 Low back pain, unspecified: Secondary | ICD-10-CM | POA: Diagnosis not present

## 2022-04-26 DIAGNOSIS — Y92009 Unspecified place in unspecified non-institutional (private) residence as the place of occurrence of the external cause: Secondary | ICD-10-CM

## 2022-04-26 DIAGNOSIS — Z79899 Other long term (current) drug therapy: Secondary | ICD-10-CM

## 2022-04-26 DIAGNOSIS — I69351 Hemiplegia and hemiparesis following cerebral infarction affecting right dominant side: Secondary | ICD-10-CM

## 2022-04-26 DIAGNOSIS — Z888 Allergy status to other drugs, medicaments and biological substances status: Secondary | ICD-10-CM

## 2022-04-26 DIAGNOSIS — M79604 Pain in right leg: Secondary | ICD-10-CM | POA: Diagnosis not present

## 2022-04-26 DIAGNOSIS — Z90710 Acquired absence of both cervix and uterus: Secondary | ICD-10-CM

## 2022-04-26 DIAGNOSIS — F32A Depression, unspecified: Secondary | ICD-10-CM | POA: Diagnosis not present

## 2022-04-26 DIAGNOSIS — G4700 Insomnia, unspecified: Secondary | ICD-10-CM | POA: Diagnosis not present

## 2022-04-26 DIAGNOSIS — Z823 Family history of stroke: Secondary | ICD-10-CM | POA: Diagnosis not present

## 2022-04-26 DIAGNOSIS — M25551 Pain in right hip: Secondary | ICD-10-CM | POA: Diagnosis present

## 2022-04-26 DIAGNOSIS — I639 Cerebral infarction, unspecified: Secondary | ICD-10-CM | POA: Diagnosis present

## 2022-04-26 DIAGNOSIS — I69023 Fluency disorder following nontraumatic subarachnoid hemorrhage: Secondary | ICD-10-CM | POA: Diagnosis not present

## 2022-04-26 DIAGNOSIS — I440 Atrioventricular block, first degree: Secondary | ICD-10-CM | POA: Diagnosis not present

## 2022-04-26 DIAGNOSIS — E119 Type 2 diabetes mellitus without complications: Secondary | ICD-10-CM | POA: Diagnosis present

## 2022-04-26 DIAGNOSIS — Z808 Family history of malignant neoplasm of other organs or systems: Secondary | ICD-10-CM

## 2022-04-26 DIAGNOSIS — K219 Gastro-esophageal reflux disease without esophagitis: Secondary | ICD-10-CM | POA: Diagnosis present

## 2022-04-26 DIAGNOSIS — R52 Pain, unspecified: Secondary | ICD-10-CM | POA: Diagnosis not present

## 2022-04-26 DIAGNOSIS — R4701 Aphasia: Secondary | ICD-10-CM | POA: Diagnosis present

## 2022-04-26 DIAGNOSIS — I10 Essential (primary) hypertension: Secondary | ICD-10-CM | POA: Diagnosis not present

## 2022-04-26 DIAGNOSIS — M25559 Pain in unspecified hip: Secondary | ICD-10-CM | POA: Diagnosis not present

## 2022-04-26 DIAGNOSIS — I609 Nontraumatic subarachnoid hemorrhage, unspecified: Principal | ICD-10-CM | POA: Diagnosis present

## 2022-04-26 DIAGNOSIS — E785 Hyperlipidemia, unspecified: Secondary | ICD-10-CM | POA: Diagnosis not present

## 2022-04-26 DIAGNOSIS — M48061 Spinal stenosis, lumbar region without neurogenic claudication: Secondary | ICD-10-CM | POA: Diagnosis not present

## 2022-04-26 DIAGNOSIS — S066X0A Traumatic subarachnoid hemorrhage without loss of consciousness, initial encounter: Secondary | ICD-10-CM | POA: Diagnosis not present

## 2022-04-26 DIAGNOSIS — R7303 Prediabetes: Secondary | ICD-10-CM | POA: Diagnosis present

## 2022-04-26 DIAGNOSIS — G9389 Other specified disorders of brain: Secondary | ICD-10-CM | POA: Diagnosis not present

## 2022-04-26 DIAGNOSIS — R471 Dysarthria and anarthria: Secondary | ICD-10-CM | POA: Diagnosis not present

## 2022-04-26 DIAGNOSIS — Z952 Presence of prosthetic heart valve: Secondary | ICD-10-CM | POA: Diagnosis not present

## 2022-04-26 DIAGNOSIS — Z7901 Long term (current) use of anticoagulants: Secondary | ICD-10-CM

## 2022-04-26 DIAGNOSIS — K5901 Slow transit constipation: Secondary | ICD-10-CM | POA: Diagnosis not present

## 2022-04-26 DIAGNOSIS — I6381 Other cerebral infarction due to occlusion or stenosis of small artery: Secondary | ICD-10-CM | POA: Diagnosis not present

## 2022-04-26 DIAGNOSIS — E876 Hypokalemia: Secondary | ICD-10-CM | POA: Diagnosis not present

## 2022-04-26 DIAGNOSIS — R4182 Altered mental status, unspecified: Secondary | ICD-10-CM | POA: Diagnosis not present

## 2022-04-26 DIAGNOSIS — R791 Abnormal coagulation profile: Secondary | ICD-10-CM | POA: Diagnosis not present

## 2022-04-26 DIAGNOSIS — Z741 Need for assistance with personal care: Secondary | ICD-10-CM | POA: Diagnosis not present

## 2022-04-26 DIAGNOSIS — I69320 Aphasia following cerebral infarction: Secondary | ICD-10-CM | POA: Diagnosis not present

## 2022-04-26 DIAGNOSIS — I69354 Hemiplegia and hemiparesis following cerebral infarction affecting left non-dominant side: Secondary | ICD-10-CM | POA: Diagnosis not present

## 2022-04-26 DIAGNOSIS — I6522 Occlusion and stenosis of left carotid artery: Secondary | ICD-10-CM | POA: Diagnosis not present

## 2022-04-26 DIAGNOSIS — I69020 Aphasia following nontraumatic subarachnoid hemorrhage: Secondary | ICD-10-CM | POA: Diagnosis not present

## 2022-04-26 DIAGNOSIS — Z20822 Contact with and (suspected) exposure to covid-19: Secondary | ICD-10-CM | POA: Diagnosis not present

## 2022-04-26 DIAGNOSIS — Z8679 Personal history of other diseases of the circulatory system: Secondary | ICD-10-CM | POA: Diagnosis not present

## 2022-04-26 DIAGNOSIS — G8929 Other chronic pain: Secondary | ICD-10-CM | POA: Diagnosis present

## 2022-04-26 DIAGNOSIS — R410 Disorientation, unspecified: Secondary | ICD-10-CM | POA: Diagnosis not present

## 2022-04-26 DIAGNOSIS — I69098 Other sequelae following nontraumatic subarachnoid hemorrhage: Secondary | ICD-10-CM | POA: Diagnosis not present

## 2022-04-26 DIAGNOSIS — I491 Atrial premature depolarization: Secondary | ICD-10-CM | POA: Diagnosis not present

## 2022-04-26 LAB — CBC WITH DIFFERENTIAL/PLATELET
Abs Immature Granulocytes: 0.02 10*3/uL (ref 0.00–0.07)
Basophils Absolute: 0 10*3/uL (ref 0.0–0.1)
Basophils Relative: 1 %
Eosinophils Absolute: 0.1 10*3/uL (ref 0.0–0.5)
Eosinophils Relative: 1 %
HCT: 41.6 % (ref 36.0–46.0)
Hemoglobin: 13.2 g/dL (ref 12.0–15.0)
Immature Granulocytes: 0 %
Lymphocytes Relative: 26 %
Lymphs Abs: 1.2 10*3/uL (ref 0.7–4.0)
MCH: 28.1 pg (ref 26.0–34.0)
MCHC: 31.7 g/dL (ref 30.0–36.0)
MCV: 88.7 fL (ref 80.0–100.0)
Monocytes Absolute: 0.2 10*3/uL (ref 0.1–1.0)
Monocytes Relative: 5 %
Neutro Abs: 3 10*3/uL (ref 1.7–7.7)
Neutrophils Relative %: 67 %
Platelets: 181 10*3/uL (ref 150–400)
RBC: 4.69 MIL/uL (ref 3.87–5.11)
RDW: 13.8 % (ref 11.5–15.5)
WBC: 4.6 10*3/uL (ref 4.0–10.5)
nRBC: 0 % (ref 0.0–0.2)

## 2022-04-26 LAB — URINALYSIS, ROUTINE W REFLEX MICROSCOPIC
Bacteria, UA: NONE SEEN
Bilirubin Urine: NEGATIVE
Glucose, UA: NEGATIVE mg/dL
Ketones, ur: NEGATIVE mg/dL
Leukocytes,Ua: NEGATIVE
Nitrite: NEGATIVE
Protein, ur: NEGATIVE mg/dL
RBC / HPF: >50 RBC/hpf — ABNORMAL HIGH (ref 0–5)
Specific Gravity, Urine: 1.015 (ref 1.005–1.030)
pH: 7 (ref 5.0–8.0)

## 2022-04-26 LAB — I-STAT VENOUS BLOOD GAS, ED
Acid-Base Excess: 3 mmol/L — ABNORMAL HIGH (ref 0.0–2.0)
Bicarbonate: 29.2 mmol/L — ABNORMAL HIGH (ref 20.0–28.0)
Calcium, Ion: 1.24 mmol/L (ref 1.15–1.40)
HCT: 40 % (ref 36.0–46.0)
Hemoglobin: 13.6 g/dL (ref 12.0–15.0)
O2 Saturation: 99 %
Potassium: 3.6 mmol/L (ref 3.5–5.1)
Sodium: 141 mmol/L (ref 135–145)
TCO2: 31 mmol/L (ref 22–32)
pCO2, Ven: 47.9 mmHg (ref 44–60)
pH, Ven: 7.393 (ref 7.25–7.43)
pO2, Ven: 128 mmHg — ABNORMAL HIGH (ref 32–45)

## 2022-04-26 LAB — RAPID URINE DRUG SCREEN, HOSP PERFORMED
Amphetamines: NOT DETECTED
Barbiturates: NOT DETECTED
Benzodiazepines: NOT DETECTED
Cocaine: NOT DETECTED
Opiates: NOT DETECTED
Tetrahydrocannabinol: NOT DETECTED

## 2022-04-26 LAB — COMPREHENSIVE METABOLIC PANEL
ALT: 26 U/L (ref 0–44)
AST: 24 U/L (ref 15–41)
Albumin: 4.2 g/dL (ref 3.5–5.0)
Alkaline Phosphatase: 92 U/L (ref 38–126)
Anion gap: 9 (ref 5–15)
BUN: 26 mg/dL — ABNORMAL HIGH (ref 8–23)
CO2: 28 mmol/L (ref 22–32)
Calcium: 10.1 mg/dL (ref 8.9–10.3)
Chloride: 104 mmol/L (ref 98–111)
Creatinine, Ser: 0.77 mg/dL (ref 0.44–1.00)
GFR, Estimated: >60 mL/min (ref >60)
Glucose, Bld: 155 mg/dL — ABNORMAL HIGH (ref 70–99)
Potassium: 3.6 mmol/L (ref 3.5–5.1)
Sodium: 141 mmol/L (ref 135–145)
Total Bilirubin: 0.6 mg/dL (ref 0.3–1.2)
Total Protein: 7.3 g/dL (ref 6.5–8.1)

## 2022-04-26 LAB — CBG MONITORING, ED: Glucose-Capillary: 167 mg/dL — ABNORMAL HIGH (ref 70–99)

## 2022-04-26 LAB — AMMONIA: Ammonia: 26 umol/L (ref 9–35)

## 2022-04-26 LAB — APTT: aPTT: 36 seconds (ref 24–36)

## 2022-04-26 LAB — PROTIME-INR
INR: 1.7 — ABNORMAL HIGH (ref 0.8–1.2)
Prothrombin Time: 19.7 seconds — ABNORMAL HIGH (ref 11.4–15.2)

## 2022-04-26 LAB — HEMOGLOBIN A1C
Hgb A1c MFr Bld: 6.1 % — ABNORMAL HIGH (ref 4.8–5.6)
Mean Plasma Glucose: 128.37 mg/dL

## 2022-04-26 MED ORDER — PANTOPRAZOLE SODIUM 40 MG PO TBEC
40.0000 mg | DELAYED_RELEASE_TABLET | Freq: Every day | ORAL | Status: DC
  Administered 2022-04-27 – 2022-04-30 (×4): 40 mg via ORAL
  Filled 2022-04-26 (×5): qty 1

## 2022-04-26 MED ORDER — SENNOSIDES-DOCUSATE SODIUM 8.6-50 MG PO TABS
1.0000 | ORAL_TABLET | Freq: Every evening | ORAL | Status: DC | PRN
Start: 2022-04-26 — End: 2022-04-30

## 2022-04-26 MED ORDER — BIOTENE DRY MOUTH MT LIQD: OROMUCOSAL | Status: DC | PRN

## 2022-04-26 MED ORDER — ACETAMINOPHEN 500 MG PO TABS: 1000.0000 mg | ORAL_TABLET | Freq: Four times a day (QID) | ORAL | Status: DC | PRN

## 2022-04-26 MED ORDER — SODIUM CHLORIDE 0.9 % IV SOLN: INTRAVENOUS | Status: DC

## 2022-04-26 MED ORDER — IOHEXOL 350 MG/ML SOLN
75.0000 mL | Freq: Once | INTRAVENOUS | Status: AC | PRN
  Administered 2022-04-26: 75 mL via INTRAVENOUS

## 2022-04-26 MED ORDER — POLYVINYL ALCOHOL 1.4 % OP SOLN
1.0000 [drp] | Freq: Two times a day (BID) | OPHTHALMIC | Status: DC | PRN
Start: 2022-04-26 — End: 2022-04-30

## 2022-04-26 MED ORDER — ATORVASTATIN CALCIUM 40 MG PO TABS
40.0000 mg | ORAL_TABLET | Freq: Every day | ORAL | Status: DC
  Administered 2022-04-27 – 2022-04-30 (×4): 40 mg via ORAL
  Filled 2022-04-26 (×5): qty 1

## 2022-04-26 MED ORDER — DOCUSATE SODIUM 100 MG PO CAPS
100.0000 mg | ORAL_CAPSULE | Freq: Every day | ORAL | Status: DC
  Administered 2022-04-27 – 2022-04-29 (×3): 100 mg via ORAL
  Filled 2022-04-26 (×3): qty 1

## 2022-04-26 MED ORDER — GABAPENTIN 100 MG PO CAPS
100.0000 mg | ORAL_CAPSULE | Freq: Every day | ORAL | Status: DC
  Administered 2022-04-27 – 2022-04-29 (×3): 100 mg via ORAL
  Filled 2022-04-26 (×3): qty 1

## 2022-04-26 MED ORDER — STROKE: EARLY STAGES OF RECOVERY BOOK
Freq: Once | Status: AC
  Filled 2022-04-26: qty 1

## 2022-04-26 MED ORDER — AMLODIPINE BESYLATE 5 MG PO TABS
5.0000 mg | ORAL_TABLET | Freq: Every day | ORAL | Status: DC
  Administered 2022-04-27 – 2022-04-28 (×2): 5 mg via ORAL
  Filled 2022-04-26 (×2): qty 1

## 2022-04-26 MED ORDER — CLEVIDIPINE BUTYRATE 0.5 MG/ML IV EMUL
0.0000 mg/h | INTRAVENOUS | Status: DC
  Filled 2022-04-26: qty 50

## 2022-04-26 NOTE — ED Provider Notes (Signed)
[facility] EMERGENCY DEPARTMENT Provider Note   CSN: [id number] Arrival date & time: 04/26/22  5035     History  Chief Complaint  Patient presents with   Altered Mental Status    [name] is a 76 y.o. female.   Altered Mental Status   76 year old female with medical history significant for hypertension, GERD, history of mitral valve replacement (mechanical) normally on Coumadin, recently switched from Coumadin to Lovenox shots, prior CVA with associated right-sided hemiparesis, DM 2 who presents to the emergency department with altered mental status.  The patient and EMS provide immediate history.  The patient states that she woke up this morning with confusion and fogginess.  She has had some difficulty getting words out.  She denies any new focal weakness.  She arrives alert and oriented x2, disoriented to year.  Last known normal was last night when she went to bed, unclear exactly the time, possibly 10 PM.  The patient's husband denies any recent falls.  He states that she has been compliant with her anticoagulation and had received 2 Lovenox shots yesterday, last shot at 8 PM.  Home Medications Prior to Admission medications   Medication Sig Start Date End Date Taking? Authorizing Provider  acetaminophen (TYLENOL) 500 MG tablet Take 1,000 mg by mouth every 6 (six) hours as needed.   Yes [provider]  amLODipine (NORVASC) 5 MG tablet Take 1 tablet (5 mg total) by mouth at bedtime. 07/15/20  Yes Angiulli, Lavon Paganini, PA-C  aspirin EC 81 MG tablet Take 81 mg by mouth daily.    Yes [provider]  atorvastatin (LIPITOR) 40 MG tablet TAKE 1 TABLET(40 MG) BY MOUTH DAILY 03/09/22  Yes Nahser, Wonda Cheng, MD  CALCIUM PO Take 1,200 mg by mouth daily.   Yes [provider]  Cholecalciferol (VITAMIN D3 PO) Take 1 tablet by mouth daily.   Yes [provider]  denosumab (PROLIA) 60 MG/ML SOSY injection Inject 60 mg into the skin every 6  (six) months.   Yes [provider]  docusate sodium (COLACE) 100 MG capsule Take 100 mg at bedtime by mouth.    Yes [provider]  enoxaparin (LOVENOX) 60 MG/0.6ML injection Inject 0.6 mLs (60 mg total) into the skin every 12 (twelve) hours. 04/19/22  Yes Donato Heinz, MD  gabapentin (NEURONTIN) 100 MG capsule TAKE 1 CAPSULE(100 MG) BY MOUTH AT BEDTIME 04/19/22  Yes Kirsteins, Luanna Salk, MD  Menthol, Topical Analgesic, (BENGAY EX) Apply 1 application topically daily. All painful areas   Yes [provider]  Mouthwashes (BIOTENE DRY MOUTH MT) Use as directed 1 spray in the mouth or throat as needed (dry mouth).   Yes [provider]  omeprazole (PRILOSEC) 20 MG capsule Take 20 mg by mouth daily.   Yes [provider]  Propylene Glycol-Glycerin (SOOTHE) 0.6-0.6 % SOLN Place 1 drop into both eyes 2 (two) times daily as needed (dry eyes).   Yes [provider]  warfarin (COUMADIN) 5 MG tablet Take Warfarin, '5mg'$  daily except 7.'5mg'$  on Mondays or as directed by [facility] 03/25/22  Yes Nahser, Wonda Cheng, MD      Allergies    Zoloft [sertraline]    Review of Systems   Review of Systems  Unable to perform ROS: Mental status change    Physical Exam Updated Vital Signs BP 140/82   Pulse 87   Temp (P) 98.5 F (36.9 C) (Oral) Comment: Simultaneous filing. User may not have  seen previous data.  Resp (!) 24   Ht '5\' 7"'$  (1.702 m)   Wt 59 kg   SpO2 99%   BMI 20.36 kg/m  Physical Exam Vitals and nursing note reviewed.  Constitutional:      General: She is not in acute distress.    Appearance: She is well-developed.  HENT:     Head: Normocephalic and atraumatic.  Eyes:     Conjunctiva/sclera: Conjunctivae normal.  Cardiovascular:     Rate and Rhythm: Normal rate and regular rhythm.  Pulmonary:     Effort: Pulmonary effort is normal. No respiratory distress.     Breath sounds: Normal breath sounds.  Abdominal:      Palpations: Abdomen is soft.     Tenderness: There is no abdominal tenderness.  Musculoskeletal:        General: No swelling.     Cervical back: Neck supple.  Skin:    General: Skin is warm and dry.     Capillary Refill: Capillary refill takes less than 2 seconds.  Neurological:     Mental Status: She is alert.     Comments: MENTAL STATUS EXAM:    Orientation: Alert and oriented to person, place.  Disoriented to time Memory: Difficulty following commands Language: Speech is intermittently aphasic  CRANIAL NERVES:    CN 2 (Optic): Visual fields intact to confrontation.  CN 3,4,6 (EOM): Pupils equal and reactive to light. Full extraocular eye movement without nystagmus.  CN 5 (Trigeminal): Facial sensation is normal, no weakness of masticatory muscles.  CN 7 (Facial): No facial weakness or asymmetry.  CN 8 (Auditory): Auditory acuity grossly normal.  CN 9,10 (Glossophar): The uvula is midline, the palate elevates symmetrically.  CN 11 (spinal access): Normal sternocleidomastoid and trapezius strength.  CN 12 (Hypoglossal): The tongue is midline. No atrophy or fasciculations.Marland Kitchen   MOTOR:  Muscle Strength: 4/5RUE, 5/5LUE, 4/5RLE, 5/5LLE.   COORDINATION:  Slight dysmetria present bilaterally.   SENSATION:   Intact to light touch all four extremities.  GAIT: Gait not assessed   Psychiatric:        Mood and Affect: Mood normal.     ED Results / Procedures / Treatments   Labs (all labs ordered are listed, but only abnormal results are displayed) Labs Reviewed  COMPREHENSIVE METABOLIC PANEL - Abnormal; Notable for the following components:      Result Value   Glucose, Bld 155 (*)    BUN 26 (*)    All other components within normal limits  URINALYSIS, ROUTINE W REFLEX MICROSCOPIC - Abnormal; Notable for the following components:   APPearance HAZY (*)    Hgb urine dipstick MODERATE (*)    RBC / HPF >50 (*)    All other components within normal limits  PROTIME-INR - Abnormal;  Notable for the following components:   Prothrombin Time 19.7 (*)    INR 1.7 (*)    All other components within normal limits  CBG MONITORING, ED - Abnormal; Notable for the following components:   Glucose-Capillary 167 (*)    All other components within normal limits  I-STAT VENOUS BLOOD GAS, ED - Abnormal; Notable for the following components:   pO2, Ven 128 (*)    Bicarbonate 29.2 (*)    Acid-Base Excess 3.0 (*)    All other components within normal limits  CBC WITH DIFFERENTIAL/PLATELET  AMMONIA  RAPID URINE DRUG SCREEN, HOSP PERFORMED  APTT  HEMOGLOBIN A1C    EKG EKG Interpretation  Date/Time:  Monday April 26 2022  09:54:58 EDT Ventricular Rate:  82 PR Interval:  241 QRS Duration: 113 QT Interval:  387 QTC Calculation: 452 R Axis:   -50 Text Interpretation: Sinus rhythm Multiple ventricular premature complexes Prolonged PR interval Left anterior fascicular block Low voltage, precordial leads Consider anterior infarct Confirmed by Regan Lemming ([id number]) on 04/26/2022 10:37:09 AM  Radiology CT ANGIO HEAD NECK W WO CM  Result Date: 04/26/2022 CLINICAL DATA:  Subarachnoid hemorrhage EXAM: CT ANGIOGRAPHY HEAD AND NECK TECHNIQUE: Multidetector CT imaging of the head and neck was performed using the standard protocol during bolus administration of intravenous contrast. Multiplanar CT image reconstructions and MIPs were obtained to evaluate the vascular anatomy. Carotid stenosis measurements (when applicable) are obtained utilizing NASCET criteria, using the distal internal carotid diameter as the denominator. RADIATION DOSE REDUCTION: This exam was performed according to the departmental dose-optimization program which includes automated exposure control, adjustment of the mA and/or kV according to patient size and/or use of iterative reconstruction technique. CONTRAST:  50m OMNIPAQUE IOHEXOL 350 MG/ML SOLN COMPARISON:  Same-day noncontrast CT head, CTA head and neck 07/02/2020 FINDINGS:  CTA NECK FINDINGS Aortic arch: There is mild calcified atherosclerotic plaque in the imaged aortic arch. The origins of the major branch vessels are patent. The subclavian arteries are patent to the level imaged. Right carotid system: The right common, internal, and external carotid arteries are patent, without hemodynamically significant stenosis or occlusion. There is no dissection or aneurysm. Left carotid system: The left common, internal, and external carotid arteries are patent with mild calcified plaque of the bifurcation but no hemodynamically significant stenosis or occlusion. There is no dissection or aneurysm. Vertebral arteries: The vertebral arteries are patent, without hemodynamically significant stenosis or occlusion. There is no dissection or aneurysm. Skeleton: There is mild for age degenerative change of the cervical spine. There is no acute osseous abnormality or suspicious osseous lesion. Postsurgical changes are again noted in the right parietal bone. There is no visible canal hematoma. Other neck: The soft tissues of the neck are unremarkable. Upper chest: The imaged lung apices are clear. Review of the MIP images confirms the above findings CTA HEAD FINDINGS Anterior circulation: The intracranial ICAs are patent. The bilateral MCAs are patent. The bilateral ACAs are patent. The anterior communicating artery is normal. There is no aneurysm or AVM to explain the patient's subarachnoid hemorrhage. Posterior circulation: The bilateral V4 segments are patent. The major cerebellar artery origins are patent. The basilar artery is patent The bilateral PCAs are patent a left posterior communicating artery. Is seen. There is no aneurysm or AVM. Venous sinuses: Patent. Anatomic variants: None. Review of the MIP images confirms the above findings IMPRESSION: Patent vasculature of the head and neck with no hemodynamically significant stenosis, occlusion, dissection, aneurysm, or AVM. Electronically Signed    By: PValetta MoleM.D.   On: 04/26/2022 13:49   CT HEAD WO CONTRAST  Result Date: 04/26/2022 CLINICAL DATA:  Provided history: Mental status change, unknown cause. EXAM: CT HEAD WITHOUT CONTRAST TECHNIQUE: Contiguous axial images were obtained from the base of the skull through the vertex without intravenous contrast. RADIATION DOSE REDUCTION: This exam was performed according to the departmental dose-optimization program which includes automated exposure control, adjustment of the mA and/or kV according to patient size and/or use of iterative reconstruction technique. COMPARISON:  Brain MRI 12/28/2021. Prior head CT examinations 12/28/2021 and earlier. FINDINGS: Brain: Mild generalized parenchymal atrophy. Acute subarachnoid hemorrhage overlying the left greater than right parietooccipital lobes, overall moderate in volume. Redemonstrated chronic  focus of encephalomalacia/gliosis within the right frontoparietal lobes. As before, there is a superimposed 9 mm ovoid hyperdense nodular focus, likely reflecting dystrophic calcification. Background mild patchy and ill-defined hypoattenuation within the cerebral white matter, nonspecific but compatible chronic small vessel ischemic disease. Known chronic lacunar infarcts within the left thalamus, better appreciated on the prior MRI. Multiple small chronic infarcts within the bilateral cerebral hemispheres, many of which were also better appreciated on the prior MRI. Subtle focus of thickening along the posterior falx measuring 3 x 8 mm, unchanged and suspicious for a small meningioma (series 3, image 20) (series 5, image 52). No midline shift or hydrocephalus. Vascular: No hyperdense vessel.  Atherosclerotic calcifications. Skull: Right parietal cranioplasty. No acute fracture or aggressive osseous lesion. Sinuses/Orbits: No mass or acute finding within the imaged orbits. Small mucous retention cysts within the right sphenoid sinus. Impression # called by telephone at  the time of interpretation on 04/26/2022 at 11:51 am to provider [facility] , who verbally acknowledged these results. IMPRESSION: 1. Acute subarachnoid hemorrhage overlying the left greater than right parietooccipital lobes, overall moderate in volume. 2. Redemonstrated focus of chronic encephalomalacia/gliosis within the right frontoparietal lobes. As before, there is a superimposed 9 mm hyperdense nodular focus at this site, likely reflecting dystrophic calcification. 3. Background mild chronic small vessel ischemic changes within the cerebral white matter. 4. Known chronic infarcts within the left thalamus and bilateral cerebellar hemispheres. 5. Subtle focus of thickening along the posterior falx measuring 3 x 8 mm, unchanged and suspicious for a small meningioma. 6. Mild generalized parenchymal atrophy. Electronically Signed   By: Kellie Simmering D.O.   On: 04/26/2022 11:58   DG Chest Port 1 View  Result Date: 04/26/2022 CLINICAL DATA:  Altered mental status EXAM: PORTABLE CHEST 1 VIEW COMPARISON:  2019 FINDINGS: No new consolidation or edema. Mild cardiomegaly. No pleural effusion or pneumothorax. IMPRESSION: No acute process in the chest.  Mild cardiomegaly. Electronically Signed   By: Macy Mis M.D.   On: 04/26/2022 10:02    Procedures .Critical Care  Performed by: Regan Lemming, MD Authorized by: Regan Lemming, MD   Critical care provider statement:    Critical care time (minutes):  30   Critical care was necessary to treat or prevent imminent or life-threatening deterioration of the following conditions:  CNS failure or compromise   Critical care was time spent personally by me on the following activities:  Development of treatment plan with patient or surrogate, discussions with consultants, evaluation of patient's response to treatment, examination of patient, ordering and review of laboratory studies, ordering and review of radiographic studies, ordering and performing treatments  and interventions, pulse oximetry, re-evaluation of patient's condition and review of old charts   Care discussed with: admitting provider       Medications Ordered in ED Medications  clevidipine (CLEVIPREX) infusion 0.5 mg/mL (0 mg/hr Intravenous Hold 04/26/22 1248)  acetaminophen (TYLENOL) tablet 1,000 mg (has no administration in time range)  amLODipine (NORVASC) tablet 5 mg (has no administration in time range)  atorvastatin (LIPITOR) tablet 40 mg (0 mg Oral Hold 04/26/22 1644)  docusate sodium (COLACE) capsule 100 mg (has no administration in time range)  pantoprazole (PROTONIX) EC tablet 40 mg (0 mg Oral Hold 04/26/22 1645)  gabapentin (NEURONTIN) capsule 100 mg (has no administration in time range)  antiseptic oral rinse (BIOTENE) solution (has no administration in time range)  polyvinyl alcohol (LIQUIFILM TEARS) 1.4 % ophthalmic solution 1 drop (has no administration in time range)  stroke: early stages of recovery book (has no administration in time range)  0.9 %  sodium chloride infusion ( Intravenous New Bag/Given 04/26/22 1641)  senna-docusate (Senokot-S) tablet 1 tablet (has no administration in time range)  iohexol (OMNIPAQUE) 350 MG/ML injection 75 mL (75 mLs Intravenous Contrast Given 04/26/22 1336)    ED Course/ Medical Decision Making/ A&P Clinical Course as of 04/26/22 1649  Mon Apr 26, 2022  1209 INR(!): 1.7 [JL]  1209 Prothrombin Time(!): 19.7 [JL]    Clinical Course User Index [JL] Regan Lemming, MD                           Medical Decision Making Amount and/or Complexity of Data Reviewed Labs: ordered. Decision-making details documented in ED Course. Radiology: ordered.  Risk Prescription drug management. Decision regarding hospitalization.   76 year old female with medical history significant for hypertension, GERD, history of mitral valve replacement (mechanical) normally on Coumadin, recently switched from Coumadin to Lovenox shots, prior CVA with  associated right-sided hemiparesis, DM 2 who presents to the emergency department with altered mental status.  The patient and EMS provide immediate history.  The patient states that she woke up this morning with confusion and fogginess.  She has had some difficulty getting words out.  She denies any new focal weakness.  She arrives alert and oriented x2, disoriented to year.  Last known normal was last night when she went to bed, unclear exactly the time, possibly 10 PM.  The patient's husband denies any recent falls.  He states that she has been compliant with her anticoagulation and had received 2 Lovenox shots yesterday, last shot at 8 PM.  On arrival, the patient was GCS 14, confused, alert and oriented x3, at her baseline motor neurologic status with 4-5 strength in the right hemibody.  No recent falls or trauma.  Differential diagnosis includes new CVA, stroke reactivation, subarachnoid or subdural hematoma, intraparenchymal hemorrhage.  Additional differential includes toxic metabolic encephalopathy, infection/sepsis from unclear source.  CBG on arrival 167, additional laboratory work-up significant for VBG without acidosis or alkalosis, ammonia normal, CMP without significant electrolyte abnormality, normal renal and liver function, PT/INR significant for an INR subtherapeutic at 1.7, expected given the patient has not been taking her warfarin, PTT ordered and normal, CBC without a leukocytosis or anemia.  Chest x-ray was performed, interpreted by myself negative for focal airspace disease, CT head was performed and interpreted by myself in addition to radiology concerning for an acute subarachnoid hemorrhage.  IMPRESSION:  1. Acute subarachnoid hemorrhage overlying the left greater than  right parietooccipital lobes, overall moderate in volume.  2. Redemonstrated focus of chronic encephalomalacia/gliosis within  the right frontoparietal lobes. As before, there is a superimposed 9  mm hyperdense  nodular focus at this site, likely reflecting  dystrophic calcification.  3. Background mild chronic small vessel ischemic changes within the  cerebral white matter.  4. Known chronic infarcts within the left thalamus and bilateral  cerebellar hemispheres.  5. Subtle focus of thickening along the posterior falx measuring 3 x  8 mm, unchanged and suspicious for a small meningioma.  6. Mild generalized parenchymal atrophy.    Discussed the patient with Dr. Trenton Gammon of neurosurgery who recommended obtaining CT angiogram of the head and neck.  Lower concern for aneurysmal bleed based on the location of the patient's subarachnoid hemorrhage.  The patient did deny any history of trauma or falls and the patient's husband also denied any  recent trauma in the last 24 hours.  Her warfarin had been held and she had been administered to Lovenox shots yesterday for anticoagulation in the setting of her atrial fibrillation and mechanical valve.  Discussed with pharmacy the need for potential reversal although given the half-life of Lovenox, no indication for immediate reversal at this time.  Consult to cardiology placed given eventual need for coordination for resumption of anticoagulation in the setting of the patient's mechanical valve.  CTA Head and Neck: No evidence of AVM or aneurysm. IMPRESSION:  Patent vasculature of the head and neck with no hemodynamically  significant stenosis, occlusion, dissection, aneurysm, or AVM.   Dr. Roosevelt Locks of hospitalist medicine was consulted for admission and ultimately accepted the patient in admission.       Final Clinical Impression(s) / ED Diagnoses Final diagnoses:  SAH (subarachnoid hemorrhage) ([place])    Rx / DC Orders ED Discharge Orders     None         Regan Lemming, MD 04/26/22 1649

## 2022-04-26 NOTE — ED Notes (Signed)
Patient transported to CT 

## 2022-04-26 NOTE — ED Notes (Signed)
Husband [name] requesting updates when available, contact info updated in chart

## 2022-04-26 NOTE — Plan of Care (Signed)
  Problem: Education: Goal: Knowledge of General Education information will improve Description: Including pain rating scale, medication(s)/side effects and non-pharmacologic comfort measures Outcome: Not Progressing   Problem: Health Behavior/Discharge Planning: Goal: Ability to manage health-related needs will improve Outcome: Not Progressing   Problem: Clinical Measurements: Goal: Ability to maintain clinical measurements within normal limits will improve Outcome: Not Progressing Goal: Will remain free from infection Outcome: Not Progressing Goal: Respiratory complications will improve Outcome: Not Applicable Goal: Cardiovascular complication will be avoided Outcome: Progressing   Problem: Nutrition: Goal: Adequate nutrition will be maintained Outcome: Not Progressing   Problem: Coping: Goal: Level of anxiety will decrease Outcome: Not Progressing

## 2022-04-26 NOTE — Progress Notes (Signed)
Attempted to contact patient's spouse and daughter to establish information regarding patient's baseline mentation. No answer from either.

## 2022-04-26 NOTE — H&P (Signed)
History and Physical    [name] JSE:[id number] DOB: [birth year] DOA: 04/26/2022  PCP: Haywood Pao, MD (Confirm with patient/family/NH records and if not entered, this has to be entered at [facility] point of entry) Patient coming from: Home  I have personally briefly reviewed patient's old medical records in [place]  Chief Complaint: Slurry speech  HPI: [name] is a 76 y.o. 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  Patient has chronic hip problem and is scheduled to have an injection this coming Thursday.  Her Coumadin has been held since Friday.  Yesterday, patient was started on Lovenox injections 1 mg/kilogram twice daily, she received 2 injections total yesterday at 8 AM and 8 PM by her husband.  She went to sleep and woke up this morning and was found to be confused and had slurred speech. She denies any headache, no vision problems no nauseous vomiting no numbness or tingling or weakness of any of the limbs. No fall.  Patient expressed that she does not have trouble understanding other people talking but appears to have " word salad"  ED Course: No tachycardia no hypotension afebrile.  CT head showed acute SAH overlying left> parieto-occipital lobes, moderate in volume.  Review of Systems: Unable to perform, slurred speech   Past Medical History:  Diagnosis Date   Abnormality of gait 09/07/2016   Allergy    Diabetes mellitus without complication [facility])    Patient denies this - notes history of glucose intolerance   GERD (gastroesophageal reflux disease)    Hemiparesis and alteration of sensations as late effects of stroke ([place]) 09/07/2016   History of pneumonia 1997   Hypertension    S/P MVR (mitral valve replacement)    Mechanical mitral valve replacement at age 48 (done in [place])  // echo 7/17: EF 55-60, normal wall  motion, bileaflet mechanical mitral valve prosthesis functioning normally, mild LAE, mildly reduced RVSF, small pericardial effusion   Stroke ([place]) 1997, 2013, 2015    Past Surgical History:  Procedure Laterality Date   [place]     reports that she has never smoked. She has never been exposed to tobacco smoke. She has never used smokeless tobacco. She reports that she does not drink alcohol and does not use drugs.  Allergies  Allergen Reactions   Zoloft [Sertraline] Hives, Itching and Rash    Family History  Problem Relation Age of Onset   Cancer Mother        Bone   Heart disease Mother    Hyperlipidemia Mother    Hypertension Mother    Stroke Father    Hypertension Father    Heart attack Neg Hx      Prior to Admission medications   Medication Sig Start Date End Date Taking? Authorizing Provider  acetaminophen (TYLENOL) 500 MG tablet Take 1,000 mg by mouth every 6 (six) hours as needed.   Yes [provider]  amLODipine (NORVASC) 5 MG tablet Take 1 tablet (5 mg total) by mouth at bedtime. 07/15/20  Yes Angiulli, Lavon Paganini, PA-C  aspirin EC 81 MG tablet Take 81 mg by mouth daily.    Yes [provider]  atorvastatin (LIPITOR) 40 MG tablet TAKE 1 TABLET(40 MG)  BY MOUTH DAILY 03/09/22  Yes Nahser, Wonda Cheng, MD  CALCIUM PO Take 1,200 mg by mouth daily.   Yes [provider]  Cholecalciferol (VITAMIN D3 PO) Take 1 tablet by mouth daily.   Yes [provider]  denosumab (PROLIA) 60 MG/ML SOSY injection Inject 60 mg into the skin every 6 (six) months.   Yes [provider]  docusate sodium (COLACE) 100 MG capsule Take 100 mg at bedtime by mouth.    Yes [provider]  enoxaparin (LOVENOX) 60 MG/0.6ML injection Inject 0.6 mLs (60 mg total) into the skin every 12 (twelve) hours. 04/19/22  Yes Donato Heinz, MD   gabapentin (NEURONTIN) 100 MG capsule TAKE 1 CAPSULE(100 MG) BY MOUTH AT BEDTIME 04/19/22  Yes Kirsteins, Luanna Salk, MD  Menthol, Topical Analgesic, (BENGAY EX) Apply 1 application topically daily. All painful areas   Yes [provider]  Mouthwashes (BIOTENE DRY MOUTH MT) Use as directed 1 spray in the mouth or throat as needed (dry mouth).   Yes [provider]  omeprazole (PRILOSEC) 20 MG capsule Take 20 mg by mouth daily.   Yes [provider]  Propylene Glycol-Glycerin (SOOTHE) 0.6-0.6 % SOLN Place 1 drop into both eyes 2 (two) times daily as needed (dry eyes).   Yes [provider]  warfarin (COUMADIN) 5 MG tablet Take Warfarin, '5mg'$  daily except 7.'5mg'$  on Mondays or as directed by [facility] 03/25/22  Yes Nahser, Wonda Cheng, MD    Physical Exam: Vitals:   04/26/22 1215 04/26/22 1334 04/26/22 1400 04/26/22 1500  BP: 128/74 139/85 137/82 140/82  Pulse: 83 87 85 87  Resp: '15 15 19 '$ (!) 24  Temp:      TempSrc:      SpO2: 98% 98% 98% 99%  Weight:      Height:        Constitutional: NAD, calm, comfortable Vitals:   04/26/22 1215 04/26/22 1334 04/26/22 1400 04/26/22 1500  BP: 128/74 139/85 137/82 140/82  Pulse: 83 87 85 87  Resp: '15 15 19 '$ (!) 24  Temp:      TempSrc:      SpO2: 98% 98% 98% 99%  Weight:      Height:       Eyes: PERRL, lids and conjunctivae normal ENMT: Mucous membranes are moist. Posterior pharynx clear of any exudate or lesions.Normal dentition.  Neck: normal, supple, no masses, no thyromegaly Respiratory: clear to auscultation bilaterally, no wheezing, no crackles. Normal respiratory effort. No accessory muscle use.  Cardiovascular: Regular rate and rhythm, no murmurs / rubs / gallops. No extremity edema. 2+ pedal pulses. No carotid bruits.  Abdomen: no tenderness, no masses palpated. No hepatosplenomegaly. Bowel sounds positive.  Musculoskeletal: no clubbing / cyanosis. No joint deformity upper and lower extremities. Good  ROM, no contractures. Normal muscle tone.  Skin: no rashes, lesions, ulcers. No induration Neurologic: No facial droops, muscle strength 5/5 all limbs, slurred speech Psychiatric: Awake alert, slurred speech   Labs on Admission: I have personally reviewed following labs and imaging studies  CBC: Recent Labs  Lab 04/26/22 0946 04/26/22 1010  WBC 4.6  --   NEUTROABS 3.0  --   HGB 13.2 13.6  HCT 41.6 40.0  MCV 88.7  --   PLT 181  --    Basic Metabolic Panel: Recent Labs  Lab 04/26/22 0946 04/26/22 1010  NA 141 141  K 3.6 3.6  CL 104  --   CO2 28  --   GLUCOSE 155*  --  BUN 26*  --   CREATININE 0.77  --   CALCIUM 10.1  --    GFR: Estimated Creatinine Clearance: 56.6 mL/min (by C-G formula based on SCr of 0.77 mg/dL). Liver Function Tests: Recent Labs  Lab 04/26/22 0946  AST 24  ALT 26  ALKPHOS 92  BILITOT 0.6  PROT 7.3  ALBUMIN 4.2   No results for input(s): "LIPASE", "AMYLASE" in the last 168 hours. Recent Labs  Lab 04/26/22 0953  AMMONIA 26   Coagulation Profile: Recent Labs  Lab 04/26/22 0946  INR 1.7*   Cardiac Enzymes: No results for input(s): "CKTOTAL", "CKMB", "CKMBINDEX", "TROPONINI" in the last 168 hours. BNP (last 3 results) No results for input(s): "PROBNP" in the last 8760 hours. HbA1C: No results for input(s): "HGBA1C" in the last 72 hours. CBG: Recent Labs  Lab 04/26/22 0945  GLUCAP 167*   Lipid Profile: No results for input(s): "CHOL", "HDL", "LDLCALC", "TRIG", "CHOLHDL", "LDLDIRECT" in the last 72 hours. Thyroid Function Tests: No results for input(s): "TSH", "T4TOTAL", "FREET4", "T3FREE", "THYROIDAB" in the last 72 hours. Anemia Panel: No results for input(s): "VITAMINB12", "FOLATE", "FERRITIN", "TIBC", "IRON", "RETICCTPCT" in the last 72 hours. Urine analysis:    Component Value Date/Time   COLORURINE YELLOW 04/26/2022 1233   APPEARANCEUR HAZY (A) 04/26/2022 1233   LABSPEC 1.015 04/26/2022 1233   PHURINE 7.0 04/26/2022  1233   GLUCOSEU NEGATIVE 04/26/2022 1233   HGBUR MODERATE (A) 04/26/2022 1233   BILIRUBINUR NEGATIVE 04/26/2022 1233   [place] 04/26/2022 1233   PROTEINUR NEGATIVE 04/26/2022 1233   NITRITE NEGATIVE 04/26/2022 1233   LEUKOCYTESUR NEGATIVE 04/26/2022 1233    Radiological Exams on Admission: CT ANGIO HEAD NECK W WO CM  Result Date: 04/26/2022 CLINICAL DATA:  Subarachnoid hemorrhage EXAM: CT ANGIOGRAPHY HEAD AND NECK TECHNIQUE: Multidetector CT imaging of the head and neck was performed using the standard protocol during bolus administration of intravenous contrast. Multiplanar CT image reconstructions and MIPs were obtained to evaluate the vascular anatomy. Carotid stenosis measurements (when applicable) are obtained utilizing NASCET criteria, using the distal internal carotid diameter as the denominator. RADIATION DOSE REDUCTION: This exam was performed according to the departmental dose-optimization program which includes automated exposure control, adjustment of the mA and/or kV according to patient size and/or use of iterative reconstruction technique. CONTRAST:  59m OMNIPAQUE IOHEXOL 350 MG/ML SOLN COMPARISON:  Same-day noncontrast CT head, CTA head and neck 07/02/2020 FINDINGS: CTA NECK FINDINGS Aortic arch: There is mild calcified atherosclerotic plaque in the imaged aortic arch. The origins of the major branch vessels are patent. The subclavian arteries are patent to the level imaged. Right carotid system: The right common, internal, and external carotid arteries are patent, without hemodynamically significant stenosis or occlusion. There is no dissection or aneurysm. Left carotid system: The left common, internal, and external carotid arteries are patent with mild calcified plaque of the bifurcation but no hemodynamically significant stenosis or occlusion. There is no dissection or aneurysm. Vertebral arteries: The vertebral arteries are patent, without hemodynamically significant  stenosis or occlusion. There is no dissection or aneurysm. Skeleton: There is mild for age degenerative change of the cervical spine. There is no acute osseous abnormality or suspicious osseous lesion. Postsurgical changes are again noted in the right parietal bone. There is no visible canal hematoma. Other neck: The soft tissues of the neck are unremarkable. Upper chest: The imaged lung apices are clear. Review of the MIP images confirms the above findings CTA HEAD FINDINGS Anterior circulation: The intracranial ICAs are  patent. The bilateral MCAs are patent. The bilateral ACAs are patent. The anterior communicating artery is normal. There is no aneurysm or AVM to explain the patient's subarachnoid hemorrhage. Posterior circulation: The bilateral V4 segments are patent. The major cerebellar artery origins are patent. The basilar artery is patent The bilateral PCAs are patent a left posterior communicating artery. Is seen. There is no aneurysm or AVM. Venous sinuses: Patent. Anatomic variants: None. Review of the MIP images confirms the above findings IMPRESSION: Patent vasculature of the head and neck with no hemodynamically significant stenosis, occlusion, dissection, aneurysm, or AVM. Electronically Signed   By: Valetta Mole M.D.   On: 04/26/2022 13:49   CT HEAD WO CONTRAST  Result Date: 04/26/2022 CLINICAL DATA:  Provided history: Mental status change, unknown cause. EXAM: CT HEAD WITHOUT CONTRAST TECHNIQUE: Contiguous axial images were obtained from the base of the skull through the vertex without intravenous contrast. RADIATION DOSE REDUCTION: This exam was performed according to the departmental dose-optimization program which includes automated exposure control, adjustment of the mA and/or kV according to patient size and/or use of iterative reconstruction technique. COMPARISON:  Brain MRI 12/28/2021. Prior head CT examinations 12/28/2021 and earlier. FINDINGS: Brain: Mild generalized parenchymal  atrophy. Acute subarachnoid hemorrhage overlying the left greater than right parietooccipital lobes, overall moderate in volume. Redemonstrated chronic focus of encephalomalacia/gliosis within the right frontoparietal lobes. As before, there is a superimposed 9 mm ovoid hyperdense nodular focus, likely reflecting dystrophic calcification. Background mild patchy and ill-defined hypoattenuation within the cerebral white matter, nonspecific but compatible chronic small vessel ischemic disease. Known chronic lacunar infarcts within the left thalamus, better appreciated on the prior MRI. Multiple small chronic infarcts within the bilateral cerebral hemispheres, many of which were also better appreciated on the prior MRI. Subtle focus of thickening along the posterior falx measuring 3 x 8 mm, unchanged and suspicious for a small meningioma (series 3, image 20) (series 5, image 52). No midline shift or hydrocephalus. Vascular: No hyperdense vessel.  Atherosclerotic calcifications. Skull: Right parietal cranioplasty. No acute fracture or aggressive osseous lesion. Sinuses/Orbits: No mass or acute finding within the imaged orbits. Small mucous retention cysts within the right sphenoid sinus. Impression # called by telephone at the time of interpretation on 04/26/2022 at 11:51 am to provider [facility] , who verbally acknowledged these results. IMPRESSION: 1. Acute subarachnoid hemorrhage overlying the left greater than right parietooccipital lobes, overall moderate in volume. 2. Redemonstrated focus of chronic encephalomalacia/gliosis within the right frontoparietal lobes. As before, there is a superimposed 9 mm hyperdense nodular focus at this site, likely reflecting dystrophic calcification. 3. Background mild chronic small vessel ischemic changes within the cerebral white matter. 4. Known chronic infarcts within the left thalamus and bilateral cerebellar hemispheres. 5. Subtle focus of thickening along the posterior  falx measuring 3 x 8 mm, unchanged and suspicious for a small meningioma. 6. Mild generalized parenchymal atrophy. Electronically Signed   By: Kellie Simmering D.O.   On: 04/26/2022 11:58   DG Chest Port 1 View  Result Date: 04/26/2022 CLINICAL DATA:  Altered mental status EXAM: PORTABLE CHEST 1 VIEW COMPARISON:  2019 FINDINGS: No new consolidation or edema. Mild cardiomegaly. No pleural effusion or pneumothorax. IMPRESSION: No acute process in the chest.  Mild cardiomegaly. Electronically Signed   By: Macy Mis M.D.   On: 04/26/2022 10:02    EKG: Independently reviewed. Sinus, first-degree AV block  Assessment/Plan Principal Problem:   SAH (subarachnoid hemorrhage) (HCC) Active Problems:   CVA (cerebral vascular  accident) [facility])  (please populate well all problems here in Problem List. (For example, if patient is on BP meds at home and you resume or decide to hold them, it is a problem that needs to be her. Same for CAD, COPD, HLD and so on)  Sub arachnoid hemorrhage, acute -Nontraumatic, likely related to parental form of anticoagulation/Lovenox injection -Significant expressive aphasia -Admit to PCU for close monitoring, frequent neurochecks, repeat CT head without contrast tomorrow to document stabilization of hemorrhage territory.  Neurosurgery on board and recommend conservative management. -Hold off anticoagulation and chemical DVT prophylaxis -Pharmacy contacted, last Lovenox given more than 12 hours, reports of Lovenox not indicated. -INR=1.7, but given significant and thromboembolic risk associated with mitral mitral valve, probably overweighs risk of continuous SAH bleeding, will not administer VitK or FFP.  Acute expressive aphasia -Secondary to acute SAH, speech evaluation  HTN -Aiming at BP 120/80 -Now is controlled, continue home BP meds  MR with metal mitral valve -As above -As for restarting anticoagulation, will D/W neuro-surgery. D/W patient and husband regarding  increased risk of thromboembolic event as anticoagulations on hold.  Both expressed understanding and agreed.  Borderline DM -Check A1C.  DVT prophylaxis: SCD Code Status: Full code Family Communication: husband at bedsdie Disposition Plan: Patient is sick with [place], expect more than 2 midnight hospital stay Consults called: Neurosurgery, cardiology Admission status: PCU   Lequita Halt MD [facility] Pager [phone]  04/26/2022, 3:52 PM

## 2022-04-26 NOTE — Progress Notes (Signed)
76 year old female with multiple medical issues including prior mechanical heart valve placement requiring anticoagulation.  Patient's anticoagulation regimen recently changed.  She presents now with headache and some confusion.  No documented history of fall.  Work-up demonstrates evidence of bilateral parietal subarachnoid hemorrhage.  Hemorrhage somewhat more prominently on the left.  Hemorrhage not the location consistent with vascular abnormality such as AVM or aneurysm.  CT angiogram negative for vascular abnormality.  At this point there is no indication for neurosurgical intervention.  The patient's anticoagulation should be held temporarily realizing that it will need to be restarted soon because of her heart valve.  Recommend medical service admission.  Please call me with any changes or new problems.

## 2022-04-26 NOTE — ED Notes (Addendum)
ED TO INPATIENT HANDOFF REPORT  ED Nurse Name and Phone #: [phone] [place] Name/Age/Gender [name] 76 y.o. female Room/Bed: 006C/006C  Code Status   Code Status: Full Code  Home/SNF/Other Home Patient oriented to: self and situation Is this baseline? No   Triage Complete: Triage complete  Chief Complaint SAH (subarachnoid hemorrhage) ([place]) [I60.9]  Triage Note Pt BIB EMS due to "fogginess". Pt has had a hx of 6 strokes. Pt LSN was last night and husband does not know a time. Pt is axox4 but takes time to process things which is new. VSS. Pt has deficits from previous strokes, abnormal gait and weakness in both arms.    Allergies Allergies  Allergen Reactions   Zoloft [Sertraline] Hives, Itching and Rash    Level of Care/Admitting Diagnosis ED Disposition     ED Disposition  Admit   Condition  --   [place]: [place] [[id number]]  Level of Care: Progressive [102]  Admit to Progressive based on following criteria: NEUROLOGICAL AND NEUROSURGICAL complex patients with significant risk of instability, who do not meet ICU criteria, yet require close observation or frequent assessment (< / = every 2 - 4 hours) with medical / nursing intervention.  May admit patient to [facility] or [name] if equivalent level of care is available:: No  Covid Evaluation: Asymptomatic - no recent exposure (last 10 days) testing not required  Diagnosis: SAH (subarachnoid hemorrhage) [name]  Estimated length of stay: past midnight tomorrow  Certification:: I certify this patient will need inpatient services for at least 2 midnights          B Medical/Surgery History Past Medical History:  Diagnosis Date   Abnormality of gait 09/07/2016   Allergy    Diabetes mellitus without complication [facility])    Patient denies this - notes history of  glucose intolerance   GERD (gastroesophageal reflux disease)    Hemiparesis and alteration of sensations as late effects of stroke ([place]) 09/07/2016   History of pneumonia 1997   Hypertension    S/P MVR (mitral valve replacement)    Mechanical mitral valve replacement at age 48 (done in [place])  // echo 7/17: EF 55-60, normal wall motion, bileaflet mechanical mitral valve prosthesis functioning normally, mild LAE, mildly reduced RVSF, small pericardial effusion   Stroke ([place]) 1997, 2013, 2015   Past Surgical History:  Procedure Laterality Date   [place]     A IV Location/Drains/Wounds Patient Lines/Drains/Airways Status     Active Line/Drains/Airways     Name Placement date Placement time Site Days   Peripheral IV 04/26/22 18 G 1.16" Posterior;Right Forearm 04/26/22  0952  Forearm  less than 1            Intake/Output Last 24 hours No intake or output data in the 24 hours ending 04/26/22 1708  Labs/Imaging Results for orders placed or performed during the hospital encounter of 04/26/22 (from the past 48 hour(s))  CBG monitoring, ED     Status: Abnormal   Collection Time: 04/26/22  9:45 AM  Result Value Ref Range   Glucose-Capillary 167 (H) 70 - 99 mg/dL    Comment: Glucose reference range applies only to samples taken after fasting for  at least 8 hours.  Comprehensive metabolic panel     Status: Abnormal   Collection Time: 04/26/22  9:46 AM  Result Value Ref Range   Sodium 141 135 - 145 mmol/L   Potassium 3.6 3.5 - 5.1 mmol/L   Chloride 104 98 - 111 mmol/L   CO2 28 22 - 32 mmol/L   Glucose, Bld 155 (H) 70 - 99 mg/dL    Comment: Glucose reference range applies only to samples taken after fasting for at least 8 hours.   BUN 26 (H) 8 - 23 mg/dL   Creatinine, Ser 0.77 0.44 - 1.00 mg/dL   Calcium 10.1 8.9 - 10.3 mg/dL   Total Protein 7.3 6.5 - 8.1 g/dL    Albumin 4.2 3.5 - 5.0 g/dL   AST 24 15 - 41 U/L   ALT 26 0 - 44 U/L   Alkaline Phosphatase 92 38 - 126 U/L   Total Bilirubin 0.6 0.3 - 1.2 mg/dL   GFR, Estimated >60 >60 mL/min    Comment: (NOTE) Calculated using the CKD-EPI Creatinine Equation (2021)    Anion gap 9 5 - 15    Comment: Performed at [facility] [address] [place], [place] [address]  CBC with Differential/Platelet     Status: None   Collection Time: 04/26/22  9:46 AM  Result Value Ref Range   WBC 4.6 4.0 - 10.5 K/uL   RBC 4.69 3.87 - 5.11 MIL/uL   Hemoglobin 13.2 12.0 - 15.0 g/dL   HCT 41.6 36.0 - 46.0 %   MCV 88.7 80.0 - 100.0 fL   MCH 28.1 26.0 - 34.0 pg   MCHC 31.7 30.0 - 36.0 g/dL   RDW 13.8 11.5 - 15.5 %   Platelets 181 150 - 400 K/uL   nRBC 0.0 0.0 - 0.2 %   Neutrophils Relative % 67 %   Neutro Abs 3.0 1.7 - 7.7 K/uL   Lymphocytes Relative 26 %   Lymphs Abs 1.2 0.7 - 4.0 K/uL   Monocytes Relative 5 %   Monocytes Absolute 0.2 0.1 - 1.0 K/uL   Eosinophils Relative 1 %   Eosinophils Absolute 0.1 0.0 - 0.5 K/uL   Basophils Relative 1 %   Basophils Absolute 0.0 0.0 - 0.1 K/uL   Immature Granulocytes 0 %   Abs Immature Granulocytes 0.02 0.00 - 0.07 K/uL    Comment: Performed at [facility], [address] [place], [place] [address]  Protime-INR     Status: Abnormal   Collection Time: 04/26/22  9:46 AM  Result Value Ref Range   Prothrombin Time 19.7 (H) 11.4 - 15.2 seconds   INR 1.7 (H) 0.8 - 1.2    Comment: (NOTE) INR goal varies based on device and disease states. Performed at [facility], [place] [address] [place], [place] [address]   Ammonia     Status: None   Collection Time: 04/26/22  9:53 AM  Result Value Ref Range   Ammonia 26 9 - 35 umol/L    Comment: Performed at [facility], [place] [address], [place], [place] [address]  I-Stat venous blood gas, ED     Status: Abnormal   Collection Time: 04/26/22 10:10 AM  Result Value Ref Range   pH, Ven 7.393 7.25 - 7.43    pCO2, Ven 47.9 44 - 60 mmHg   pO2, Ven 128 (H) 32 - 45 mmHg   Bicarbonate 29.2 (H) 20.0 - 28.0 mmol/L   TCO2  31 22 - 32 mmol/L   O2 Saturation 99 %   Acid-Base Excess 3.0 (H) 0.0 - 2.0 mmol/L   Sodium 141 135 - 145 mmol/L   Potassium 3.6 3.5 - 5.1 mmol/L   Calcium, Ion 1.24 1.15 - 1.40 mmol/L   HCT 40.0 36.0 - 46.0 %   Hemoglobin 13.6 12.0 - 15.0 g/dL   Sample type VENOUS   APTT     Status: None   Collection Time: 04/26/22 12:12 PM  Result Value Ref Range   aPTT 36 24 - 36 seconds    Comment: Performed at [facility] [address], [place], [place] [address]  Urinalysis, Routine w reflex microscopic Urine, Catheterized     Status: Abnormal   Collection Time: 04/26/22 12:33 PM  Result Value Ref Range   Color, Urine YELLOW YELLOW   APPearance HAZY (A) CLEAR   Specific Gravity, Urine 1.015 1.005 - 1.030   pH 7.0 5.0 - 8.0   Glucose, UA NEGATIVE NEGATIVE mg/dL   Hgb urine dipstick MODERATE (A) NEGATIVE   Bilirubin Urine NEGATIVE NEGATIVE   Ketones, ur NEGATIVE NEGATIVE mg/dL   Protein, ur NEGATIVE NEGATIVE mg/dL   Nitrite NEGATIVE NEGATIVE   Leukocytes,Ua NEGATIVE NEGATIVE   RBC / HPF >50 (H) 0 - 5 RBC/hpf   WBC, UA 0-5 0 - 5 WBC/hpf   Bacteria, UA NONE SEEN NONE SEEN   Mucus PRESENT     Comment: Performed at [facility] [address], [place], [place] [address]  Rapid urine drug screen (hospital performed)     Status: None   Collection Time: 04/26/22 12:33 PM  Result Value Ref Range   Opiates NONE DETECTED NONE DETECTED   Cocaine NONE DETECTED NONE DETECTED   Benzodiazepines NONE DETECTED NONE DETECTED   Amphetamines NONE DETECTED NONE DETECTED   Tetrahydrocannabinol NONE DETECTED NONE DETECTED   Barbiturates NONE DETECTED NONE DETECTED    Comment: (NOTE) DRUG SCREEN FOR MEDICAL PURPOSES ONLY.  IF CONFIRMATION IS NEEDED FOR ANY PURPOSE, NOTIFY LAB WITHIN 5 DAYS.  LOWEST DETECTABLE LIMITS FOR URINE DRUG SCREEN Drug Class                     Cutoff  (ng/mL) Amphetamine and metabolites    1000 Barbiturate and metabolites    200 Benzodiazepine                 240 Tricyclics and metabolites     300 Opiates and metabolites        300 Cocaine and metabolites        300 THC                            50 Performed at [facility], [place] [address], [place], [place] [address]    CT ANGIO HEAD NECK W WO CM  Result Date: 04/26/2022 CLINICAL DATA:  Subarachnoid hemorrhage EXAM: CT ANGIOGRAPHY HEAD AND NECK TECHNIQUE: Multidetector CT imaging of the head and neck was performed using the standard protocol during bolus administration of intravenous contrast. Multiplanar CT image reconstructions and MIPs were obtained to evaluate the vascular anatomy. Carotid stenosis measurements (when applicable) are obtained utilizing NASCET criteria, using the distal internal carotid diameter as the denominator. RADIATION DOSE REDUCTION: This exam was performed according to the departmental dose-optimization program which includes automated exposure control, adjustment of the mA and/or kV according to patient size and/or use of iterative  reconstruction technique. CONTRAST:  63m OMNIPAQUE IOHEXOL 350 MG/ML SOLN COMPARISON:  Same-day noncontrast CT head, CTA head and neck 07/02/2020 FINDINGS: CTA NECK FINDINGS Aortic arch: There is mild calcified atherosclerotic plaque in the imaged aortic arch. The origins of the major branch vessels are patent. The subclavian arteries are patent to the level imaged. Right carotid system: The right common, internal, and external carotid arteries are patent, without hemodynamically significant stenosis or occlusion. There is no dissection or aneurysm. Left carotid system: The left common, internal, and external carotid arteries are patent with mild calcified plaque of the bifurcation but no hemodynamically significant stenosis or occlusion. There is no dissection or aneurysm. Vertebral arteries: The vertebral arteries are patent, without  hemodynamically significant stenosis or occlusion. There is no dissection or aneurysm. Skeleton: There is mild for age degenerative change of the cervical spine. There is no acute osseous abnormality or suspicious osseous lesion. Postsurgical changes are again noted in the right parietal bone. There is no visible canal hematoma. Other neck: The soft tissues of the neck are unremarkable. Upper chest: The imaged lung apices are clear. Review of the MIP images confirms the above findings CTA HEAD FINDINGS Anterior circulation: The intracranial ICAs are patent. The bilateral MCAs are patent. The bilateral ACAs are patent. The anterior communicating artery is normal. There is no aneurysm or AVM to explain the patient's subarachnoid hemorrhage. Posterior circulation: The bilateral V4 segments are patent. The major cerebellar artery origins are patent. The basilar artery is patent The bilateral PCAs are patent a left posterior communicating artery. Is seen. There is no aneurysm or AVM. Venous sinuses: Patent. Anatomic variants: None. Review of the MIP images confirms the above findings IMPRESSION: Patent vasculature of the head and neck with no hemodynamically significant stenosis, occlusion, dissection, aneurysm, or AVM. Electronically Signed   By: PValetta MoleM.D.   On: 04/26/2022 13:49   CT HEAD WO CONTRAST  Result Date: 04/26/2022 CLINICAL DATA:  Provided history: Mental status change, unknown cause. EXAM: CT HEAD WITHOUT CONTRAST TECHNIQUE: Contiguous axial images were obtained from the base of the skull through the vertex without intravenous contrast. RADIATION DOSE REDUCTION: This exam was performed according to the departmental dose-optimization program which includes automated exposure control, adjustment of the mA and/or kV according to patient size and/or use of iterative reconstruction technique. COMPARISON:  Brain MRI 12/28/2021. Prior head CT examinations 12/28/2021 and earlier. FINDINGS: Brain: Mild  generalized parenchymal atrophy. Acute subarachnoid hemorrhage overlying the left greater than right parietooccipital lobes, overall moderate in volume. Redemonstrated chronic focus of encephalomalacia/gliosis within the right frontoparietal lobes. As before, there is a superimposed 9 mm ovoid hyperdense nodular focus, likely reflecting dystrophic calcification. Background mild patchy and ill-defined hypoattenuation within the cerebral white matter, nonspecific but compatible chronic small vessel ischemic disease. Known chronic lacunar infarcts within the left thalamus, better appreciated on the prior MRI. Multiple small chronic infarcts within the bilateral cerebral hemispheres, many of which were also better appreciated on the prior MRI. Subtle focus of thickening along the posterior falx measuring 3 x 8 mm, unchanged and suspicious for a small meningioma (series 3, image 20) (series 5, image 52). No midline shift or hydrocephalus. Vascular: No hyperdense vessel.  Atherosclerotic calcifications. Skull: Right parietal cranioplasty. No acute fracture or aggressive osseous lesion. Sinuses/Orbits: No mass or acute finding within the imaged orbits. Small mucous retention cysts within the right sphenoid sinus. Impression # called by telephone at the time of interpretation on 04/26/2022 at 11:51 am [facility]  [facility] , who verbally acknowledged these results. IMPRESSION: 1. Acute subarachnoid hemorrhage overlying the left greater than right parietooccipital lobes, overall moderate in volume. 2. Redemonstrated focus of chronic encephalomalacia/gliosis within the right frontoparietal lobes. As before, there is a superimposed 9 mm hyperdense nodular focus at this site, likely reflecting dystrophic calcification. 3. Background mild chronic small vessel ischemic changes within the cerebral white matter. 4. Known chronic infarcts within the left thalamus and bilateral cerebellar hemispheres. 5. Subtle focus of thickening  along the posterior falx measuring 3 x 8 mm, unchanged and suspicious for a small meningioma. 6. Mild generalized parenchymal atrophy. Electronically Signed   By: Kellie Simmering D.O.   On: 04/26/2022 11:58   DG Chest Port 1 View  Result Date: 04/26/2022 CLINICAL DATA:  Altered mental status EXAM: PORTABLE CHEST 1 VIEW COMPARISON:  2019 FINDINGS: No new consolidation or edema. Mild cardiomegaly. No pleural effusion or pneumothorax. IMPRESSION: No acute process in the chest.  Mild cardiomegaly. Electronically Signed   By: Macy Mis M.D.   On: 04/26/2022 10:02    Pending Labs Unresulted Labs (From admission, onward)     Start     Ordered   04/27/22 0500  Protime-INR  Tomorrow morning,   R        04/26/22 1458   04/27/22 0500  CBC  Tomorrow morning,   R        04/26/22 1458   04/26/22 1612  Hemoglobin A1c  Add-on,   AD        04/26/22 1611            Vitals/Pain Today's Vitals   04/26/22 1215 04/26/22 1334 04/26/22 1400 04/26/22 1500  BP: 128/74 139/85 137/82 140/82  Pulse: 83 87 85 87  Resp: '15 15 19 '$ (!) 24  Temp:      TempSrc:      SpO2: 98% 98% 98% 99%  Weight:      Height:      PainSc:        Isolation Precautions No active isolations  Medications Medications  clevidipine (CLEVIPREX) infusion 0.5 mg/mL (0 mg/hr Intravenous Hold 04/26/22 1248)  acetaminophen (TYLENOL) tablet 1,000 mg (has no administration in time range)  amLODipine (NORVASC) tablet 5 mg (has no administration in time range)  atorvastatin (LIPITOR) tablet 40 mg (0 mg Oral Hold 04/26/22 1644)  docusate sodium (COLACE) capsule 100 mg (has no administration in time range)  pantoprazole (PROTONIX) EC tablet 40 mg (0 mg Oral Hold 04/26/22 1645)  gabapentin (NEURONTIN) capsule 100 mg (has no administration in time range)  antiseptic oral rinse (BIOTENE) solution (has no administration in time range)  polyvinyl alcohol (LIQUIFILM TEARS) 1.4 % ophthalmic solution 1 drop (has no administration in time range)    stroke: early stages of recovery book (has no administration in time range)  0.9 %  sodium chloride infusion ( Intravenous New Bag/Given 04/26/22 1641)  senna-docusate (Senokot-S) tablet 1 tablet (has no administration in time range)  iohexol (OMNIPAQUE) 350 MG/ML injection 75 mL (75 mLs Intravenous Contrast Given 04/26/22 1336)    Mobility non-ambulatory High fall risk   Focused Assessments Neuro Assessment Handoff:  Swallow screen pass? No  Cardiac Rhythm: Normal sinus rhythm NIH Stroke Scale ( + Modified Stroke Scale Criteria)  Interval: Initial Level of Consciousness (1a.)   : Alert, keenly responsive LOC Questions (1b. )   +: Answers one question correctly LOC Commands (1c. )   + : Performs both tasks correctly Best Gaze (2. )  +:  Normal Visual (3. )  +: No visual loss Facial Palsy (4. )    : Normal symmetrical movements Motor Arm, Left (5a. )   +: No drift Motor Arm, Right (5b. )   +: No drift Motor Leg, Left (6a. )   +: No drift Motor Leg, Right (6b. )   +: No drift Limb Ataxia (7. ): Absent Sensory (8. )   +: Normal, no sensory loss Best Language (9. )   +: Severe aphasia Dysarthria (10. ): Normal Extinction/Inattention (11.)   +: No Abnormality Modified SS Total  +: 3 Complete NIHSS TOTAL: 3     Neuro Assessment: Exceptions to WDL Neuro Checks:   Initial (04/26/22 1156)  Last Documented NIHSS Modified Score: 3 (04/26/22 1156) Has TPA been given? No If patient is a Neuro Trauma and patient is going to OR before floor call report to [place] nurse: [phone] or [phone]   R Recommendations: See Admitting Provider Note  Report given to:   Additional Notes: Pt unable to follow commands, alert to self, occasionally alert to place and situation, disoriented to time. NIH 3 for LOC questions answered incorrectly and difficulty with language. Failed stroke swallow screen due to unable to follow commands.

## 2022-04-26 NOTE — ED Triage Notes (Signed)
Pt BIB EMS due to "fogginess". Pt has had a hx of 6 strokes. Pt LSN was last night and husband does not know a time. Pt is axox4 but takes time to process things which is new. VSS. Pt has deficits from previous strokes, abnormal gait and weakness in both arms.

## 2022-04-27 ENCOUNTER — Inpatient Hospital Stay (HOSPITAL_COMMUNITY): Payer: Medicare Other

## 2022-04-27 DIAGNOSIS — I609 Nontraumatic subarachnoid hemorrhage, unspecified: Secondary | ICD-10-CM | POA: Diagnosis not present

## 2022-04-27 LAB — CBC
HCT: 38.5 % (ref 36.0–46.0)
Hemoglobin: 12.2 g/dL (ref 12.0–15.0)
MCH: 27.9 pg (ref 26.0–34.0)
MCHC: 31.7 g/dL (ref 30.0–36.0)
MCV: 87.9 fL (ref 80.0–100.0)
Platelets: 173 10*3/uL (ref 150–400)
RBC: 4.38 MIL/uL (ref 3.87–5.11)
RDW: 13.5 % (ref 11.5–15.5)
WBC: 6.4 10*3/uL (ref 4.0–10.5)
nRBC: 0 % (ref 0.0–0.2)

## 2022-04-27 LAB — PROTIME-INR
INR: 1.5 — ABNORMAL HIGH (ref 0.8–1.2)
Prothrombin Time: 18 seconds — ABNORMAL HIGH (ref 11.4–15.2)

## 2022-04-27 NOTE — Plan of Care (Signed)
  Problem: Education: Goal: Knowledge of disease or condition will improve Outcome: Progressing Goal: Knowledge of patient specific risk factors will improve (INDIVIDUALIZE FOR PATIENT) Outcome: Progressing

## 2022-04-27 NOTE — Progress Notes (Signed)
Inpatient Rehab Admissions Coordinator:  ? ?Per PT recommendation,  patient was screened for CIR candidacy by Adolf Ormiston, MS, CCC-SLP. At this time, Pt. Appears to be a a potential candidate for CIR. I will place order for rehab consult per protocol for full assessment. Please contact me any with questions. ? ?Olanrewaju Osborn, MS, CCC-SLP ?Rehab Admissions Coordinator  ?[phone] (celll) ?[phone] (office) ? ?

## 2022-04-27 NOTE — Evaluation (Addendum)
Physical Therapy Evaluation Patient Details Name: [name] MRN: [id number] DOB: 1946-10-27 Today's Date: 04/27/2022  History of Present Illness  76 yo with h/o multiple strokes adm 6/19 to [facility] with confusion and speech deficits.    Per MD notes "She was to have injection (for a ) hip problem and is scheduled to have an injection this coming Thursday.  Her Coumadin has been held since Friday.  Yesterday, patient was started on Lovenox injections 1 mg/kilogram twice daily, she received 2 injections total yesterday at 8 AM and 8 PM by her husband.  She went to sleep and woke up this morning and was found to be confused and had slurred speech."  Imaging showed SAH overlying the left greater than right parietaoccipital lobes, overall moderate in volume.  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  Clinical Impression  Pt admitted with/for the neurological s/s listed above and the problems related below.  Pt needing light moderate assist for basic mobility and gait in part due to significant communication issues, trouble initiating, following directions. Pt currently limited functionally due to the problems listed. ( See problems list.)   Pt will benefit from PT to maximize function and safety in order to get ready for next venue listed below.        Recommendations for follow up therapy are one component of a multi-disciplinary discharge planning process, led by the attending physician.  Recommendations may be updated based on patient status, additional functional criteria and insurance authorization.  Follow Up Recommendations Acute inpatient rehab (3hours/day)    Assistance Recommended at Discharge Frequent or constant Supervision/Assistance  Patient can return home with the following  A little help with walking and/or transfers;A little help with bathing/dressing/bathroom;Assistance with cooking/housework;Assist for  transportation;Help with stairs or ramp for entrance    Equipment Recommendations  (TBA, only have quad cane)  Recommendations for Other Services  Rehab consult    Functional Status Assessment Patient has had a recent decline in their functional status and demonstrates the ability to make significant improvements in function in a reasonable and predictable amount of time.     Precautions / Restrictions Precautions Precautions: Fall Restrictions Weight Bearing Restrictions: No      Mobility  Bed Mobility Overal bed mobility: Needs Assistance Bed Mobility: Supine to Sit     Supine to sit: Mod assist     General bed mobility comments: cues for direction then initiation, truncal assist forward, pad to scoot forward.  It is difficult to determine how much is communication vs motor planning.    Transfers Overall transfer level: Needs assistance Equipment used: Rolling walker (2 wheels) (iv pole) Transfers: Sit to/from Stand, Bed to chair/wheelchair/BSC Sit to Stand: Min assist, Mod assist   Step pivot transfers: Mod assist       General transfer comment: As pt started to understand my cues, she participated more in initiation and follow through of standing.    Ambulation/Gait Ambulation/Gait assistance: Mod assist Gait Distance (Feet): 8 Feet (x2) Assistive device: Rolling walker (2 wheels) Gait Pattern/deviations: Step-to pattern, Step-through pattern, Decreased step length - right, Decreased stance time - left, Decreased step length - left, Decreased stride length   Gait velocity interpretation: <1.31 ft/sec, indicative of household ambulator   General Gait Details: unsteady, mildly ataxic on the L, body position within the RW biased L, steps/swing though of the L LE were biased L toward the L side of the RW with  litle/no heel toe pattern.  Stiff trunk, higher tone in the UE's with some difficulty directing the RW  Stairs            Wheelchair Mobility     Modified Rankin (Stroke Patients Only) Modified Rankin (Stroke Patients Only) Pre-Morbid Rankin Score: Moderate disability Modified Rankin: Moderately severe disability     Balance                                             Pertinent Vitals/Pain Pain Assessment Pain Assessment: No/denies pain    Home Living Family/patient expects to be discharged to:: Private residence Living Arrangements: Spouse/significant other Available Help at Discharge: Available 24 hours/day;Family Type of Home: House Home Access: Stairs to enter   [employer] of Steps: 1 Alternate Level Stairs-Number of Steps: flight Home Layout: Two level;Able to live on main level with bedroom/bathroom Home Equipment: Kasandra Knudsen - quad      Prior Function Prior Level of Function : Needs assist       Physical Assist : Mobility (physical);ADLs (physical)     Mobility Comments: used quad cane in the home, generally mod I with mobility.  Occasionally went out with husband to run errands.  Could push the cart for short distances. ADLs Comments: took sponge baths in lieu of getting into the tub.     Hand Dominance        Extremity/Trunk Assessment   Upper Extremity Assessment Upper Extremity Assessment: Defer to OT evaluation;LUE deficits/detail LUE Deficits / Details: drawn into mild flexion and unattended to.  increased tone, moved generally in synergy.    Lower Extremity Assessment Lower Extremity Assessment: LLE deficits/detail;RLE deficits/detail RLE Deficits / Details: functional strength, mild incoordination LLE Deficits / Details: paretic, mildly increased tone, uncoordinated mildly ataxic movement    Cervical / Trunk Assessment Cervical / Trunk Assessment: Normal  Communication   Communication: Receptive difficulties;Expressive difficulties  Cognition Arousal/Alertness: Awake/alert Behavior During Therapy: WFL for tasks assessed/performed Overall Cognitive Status:  Difficult to assess                                          General Comments      Exercises     Assessment/Plan    PT Assessment Patient needs continued PT services  PT Problem List Decreased strength;Decreased activity tolerance;Decreased balance;Decreased mobility;Decreased coordination;Decreased knowledge of use of DME       PT Treatment Interventions DME instruction;Gait training;Functional mobility training;Therapeutic activities;Balance training;Neuromuscular re-education;Patient/family education    PT Goals (Current goals can be found in the Care Plan section)  Acute Rehab PT Goals PT Goal Formulation: Patient unable to participate in goal setting Time For Goal Achievement: 05/11/22 Potential to Achieve Goals: Good    Frequency Min 3X/week     Co-evaluation               AM-PAC PT "6 Clicks" Mobility  Outcome Measure Help needed turning from your back to your side while in a flat bed without using bedrails?: A Lot Help needed moving from lying on your back to sitting on the side of a flat bed without using bedrails?: A Lot Help needed moving to and from a bed to a chair (including a wheelchair)?: A Lot Help needed standing up from a chair using  your arms (e.g., wheelchair or bedside chair)?: A Lot Help needed to walk in hospital room?: A Lot Help needed climbing 3-5 steps with a railing? : A Lot 6 Click Score: 12    End of Session   Activity Tolerance: Patient tolerated treatment well Patient left: in chair;with call bell/phone within reach;with chair alarm set;with family/visitor present Nurse Communication: Mobility status PT Visit Diagnosis: Other abnormalities of gait and mobility (R26.89);Other symptoms and signs involving the nervous system (R29.898)    Time: 2952-8413 PT Time Calculation (min) (ACUTE ONLY): 33 min   Charges:   PT Evaluation $PT Eval Moderate Complexity: 1 Mod PT Treatments $Gait Training: 8-22 mins         04/27/2022  Ginger Carne., PT Acute Rehabilitation Services [phone]  (pager) [phone]  (office)  Tessie Fass Janny Crute 04/27/2022, 11:37 AM

## 2022-04-27 NOTE — Plan of Care (Signed)
  Problem: Education: Goal: Knowledge of General Education information will improve Description: Including pain rating scale, medication(s)/side effects and non-pharmacologic comfort measures Outcome: Not Progressing   Problem: Health Behavior/Discharge Planning: Goal: Ability to manage health-related needs will improve Outcome: Not Progressing   Problem: Clinical Measurements: Goal: Ability to maintain clinical measurements within normal limits will improve Outcome: Not Progressing Goal: Will remain free from infection Outcome: Not Progressing Goal: Respiratory complications will improve Outcome: Not Applicable Goal: Cardiovascular complication will be avoided Outcome: Progressing   Problem: Coping: Goal: Level of anxiety will decrease Outcome: Not Progressing   Problem: Education: Goal: Knowledge of disease or condition will improve Outcome: Not Progressing Goal: Knowledge of secondary prevention will improve (SELECT ALL) Outcome: Not Progressing Goal: Knowledge of patient specific risk factors will improve (INDIVIDUALIZE FOR PATIENT) Outcome: Not Progressing   Problem: Coping: Goal: Will identify appropriate support needs Outcome: Not Progressing   Problem: Self-Care: Goal: Verbalization of feelings and concerns over difficulty with self-care will improve Outcome: Not Progressing Goal: Ability to communicate needs accurately will improve Outcome: Not Progressing   Problem: Nutrition: Goal: Dietary intake will improve Outcome: Not Progressing

## 2022-04-27 NOTE — Progress Notes (Signed)
PROGRESS NOTE    [name]  TGG:[id number] DOB: November 19, 1945 DOA: 04/26/2022 PCP: Haywood Pao, MD     Brief Narrative:  [name] is a 76 y.o. 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 Patient has chronic hip problem and is scheduled to have an injection this coming Thursday.  Her Coumadin has been held since Friday.  Yesterday, patient was started on Lovenox injections 1 mg/kilogram twice daily, she received 2 injections total yesterday at 8 AM and 8 PM by her husband.  She went to sleep and woke up this morning and was found to be confused and had slurred speech. CT head showed acute SAH overlying left> parieto-occipital lobes, moderate in volume.  New events last 24 hours / Subjective: She is alert, unable to tell me the location of where she is or the year.  She is visibly frustrated, says "19".  She is also not oriented to situation.  She is able to get words out, but her sentences are not appropriate to questions asked.  She is able to name "ballpoint pen" when presented a pen.  She is able to tell me her husband's name "[name]."  Assessment & Plan:   Principal Problem:   SAH (subarachnoid hemorrhage) (HCC) Active Problems:   HTN (hypertension)   Hyperlipemia   Anticoagulated on warfarin   Prediabetes   Subarachnoid hemorrhage -Nontraumatic, in setting of anticoagulation -Patient with difficulty in speech, impaired expressive language -Repeat CT headThis morning revealed moderate amount of subarachnoid hemorrhage in the left parietal cortex with slight interval increase -Discussed with Dr. Annette Stable of neurosurgery today.  No change recommendation at this time. Could consider restarting anticoagulation 72 hours after presentation -PT OT SLP  Mitral regurgitation with metal mitral valve -Consider restarting anticoagulation 72 hours after  presentation  Hypertension -Norvasc  Hyperlipidemia -Lipitor    DVT prophylaxis:  SCD's Start: 04/26/22 1534  Code Status: Full code Family Communication: Spouse at bedside Disposition Plan:  Status is: Inpatient Remains inpatient appropriate because: Continue to monitor, needs PT OT   Antimicrobials:  Anti-infectives (From admission, onward)    None        Objective: Vitals:   04/26/22 2357 04/27/22 0338 04/27/22 0741 04/27/22 1144  BP: (!) 136/95 (!) 125/94 (!) 141/91 124/88  Pulse: (!) 108 95 97 97  Resp: '20 17 20 20  '$ Temp: 99.7 F (37.6 C) 98.5 F (36.9 C) 98.8 F (37.1 C) 97.9 F (36.6 C)  TempSrc: Oral Oral Oral Oral  SpO2: 97% 98% 97% 98%  Weight:      Height:        Intake/Output Summary (Last 24 hours) at 04/27/2022 1309 Last data filed at 04/27/2022 1121 Gross per 24 hour  Intake 1036.66 ml  Output 900 ml  Net 136.66 ml   Filed Weights   04/26/22 0938  Weight: 59 kg    Examination:  General exam: Appears calm and comfortable  Respiratory system: Clear to auscultation. Respiratory effort normal. No respiratory distress. No conversational dyspnea.  Cardiovascular system: S1 & S2 heard, RRR. No murmurs. No pedal edema. Gastrointestinal system: Abdomen is nondistended, soft and nontender. Normal bowel sounds heard. Central nervous system: Alert and oriented. + Tongue deviates to the right, strength is equal all extremities.  EOMI.  Some level of expressive aphasia Extremities: Symmetric in appearance  Skin: No rashes, lesions or ulcers on exposed skin  Psychiatry: Judgement and insight appear normal. Mood & affect appropriate.   Data Reviewed: I have personally reviewed following labs and imaging studies  CBC: Recent Labs  Lab 04/26/22 0946 04/26/22 1010 04/27/22 0038  WBC 4.6  --  6.4  NEUTROABS 3.0  --   --   HGB 13.2 13.6 12.2  HCT 41.6 40.0 38.5  MCV 88.7  --  87.9  PLT 181  --  756   Basic Metabolic Panel: Recent Labs  Lab  04/26/22 0946 04/26/22 1010  NA 141 141  K 3.6 3.6  CL 104  --   CO2 28  --   GLUCOSE 155*  --   BUN 26*  --   CREATININE 0.77  --   CALCIUM 10.1  --    GFR: Estimated Creatinine Clearance: 56.6 mL/min (by C-G formula based on SCr of 0.77 mg/dL). Liver Function Tests: Recent Labs  Lab 04/26/22 0946  AST 24  ALT 26  ALKPHOS 92  BILITOT 0.6  PROT 7.3  ALBUMIN 4.2   No results for input(s): "LIPASE", "AMYLASE" in the last 168 hours. Recent Labs  Lab 04/26/22 0953  AMMONIA 26   Coagulation Profile: Recent Labs  Lab 04/26/22 0946 04/27/22 0038  INR 1.7* 1.5*   Cardiac Enzymes: No results for input(s): "CKTOTAL", "CKMB", "CKMBINDEX", "TROPONINI" in the last 168 hours. BNP (last 3 results) No results for input(s): "PROBNP" in the last 8760 hours. HbA1C: Recent Labs    04/26/22 0946  HGBA1C 6.1*   CBG: Recent Labs  Lab 04/26/22 0945  GLUCAP 167*   Lipid Profile: No results for input(s): "CHOL", "HDL", "LDLCALC", "TRIG", "CHOLHDL", "LDLDIRECT" in the last 72 hours. Thyroid Function Tests: No results for input(s): "TSH", "T4TOTAL", "FREET4", "T3FREE", "THYROIDAB" in the last 72 hours. Anemia Panel: No results for input(s): "VITAMINB12", "FOLATE", "FERRITIN", "TIBC", "IRON", "RETICCTPCT" in the last 72 hours. Sepsis Labs: No results for input(s): "PROCALCITON", "LATICACIDVEN" in the last 168 hours.  No results found for this or any previous visit (from the past 240 hour(s)).    Radiology Studies: CT HEAD WO CONTRAST (5MM)  Result Date: 04/27/2022 CLINICAL DATA:  Subarachnoid hemorrhage EXAM: CT HEAD WITHOUT CONTRAST TECHNIQUE: Contiguous axial images were obtained from the base of the skull through the vertex without intravenous contrast. RADIATION DOSE REDUCTION: This exam was performed according to the departmental dose-optimization program which includes automated exposure control, adjustment of the mA and/or kV according to patient size and/or use of  iterative reconstruction technique. COMPARISON:  Previous studies including the examination done on 04/26/2022 FINDINGS: Brain: There is previous right parietal craniotomy. There is encephalomalacia in the right parietal lobe with no significant change. In image 23 of series 3, there is 9 mm high density structure in the center of area of encephalomalacia, possibly calcification with no significant change. There is moderate amount of subarachnoid hemorrhage in the left parietal cortex with slight interval increase in size. There is small focus of subarachnoid hemorrhage in the right parietal cortex with no significant change. There are no epidural or subdural fluid collections. Small old lacunar infarcts are seen in the left thalamus and both cerebellar hemispheres with no significant interval change. Ventricles are not dilated. There is no evidence of blood within the ventricles. There is small lenticular shaped focal thickening in the posterior falx with no significant change. Cortical sulci are prominent suggesting atrophy. Vascular: Unremarkable. Skull: There is previous right parietal craniotomy. Sinuses/Orbits: Unremarkable. Other: None. IMPRESSION: There is moderate amount of subarachnoid hemorrhage in  the left parietal cortex with slight interval increase. Small amount of subarachnoid hemorrhage in the right parietal cortex as not changed. There are no epidural or subdural fluid collections. There is no evidence of intraventricular blood. There is no significant focal mass effect. Other findings as described in the body of the report. Electronically Signed   By: Elmer Picker M.D.   On: 04/27/2022 08:23   CT ANGIO HEAD NECK W WO CM  Result Date: 04/26/2022 CLINICAL DATA:  Subarachnoid hemorrhage EXAM: CT ANGIOGRAPHY HEAD AND NECK TECHNIQUE: Multidetector CT imaging of the head and neck was performed using the standard protocol during bolus administration of intravenous contrast. Multiplanar CT  image reconstructions and MIPs were obtained to evaluate the vascular anatomy. Carotid stenosis measurements (when applicable) are obtained utilizing NASCET criteria, using the distal internal carotid diameter as the denominator. RADIATION DOSE REDUCTION: This exam was performed according to the departmental dose-optimization program which includes automated exposure control, adjustment of the mA and/or kV according to patient size and/or use of iterative reconstruction technique. CONTRAST:  32m OMNIPAQUE IOHEXOL 350 MG/ML SOLN COMPARISON:  Same-day noncontrast CT head, CTA head and neck 07/02/2020 FINDINGS: CTA NECK FINDINGS Aortic arch: There is mild calcified atherosclerotic plaque in the imaged aortic arch. The origins of the major branch vessels are patent. The subclavian arteries are patent to the level imaged. Right carotid system: The right common, internal, and external carotid arteries are patent, without hemodynamically significant stenosis or occlusion. There is no dissection or aneurysm. Left carotid system: The left common, internal, and external carotid arteries are patent with mild calcified plaque of the bifurcation but no hemodynamically significant stenosis or occlusion. There is no dissection or aneurysm. Vertebral arteries: The vertebral arteries are patent, without hemodynamically significant stenosis or occlusion. There is no dissection or aneurysm. Skeleton: There is mild for age degenerative change of the cervical spine. There is no acute osseous abnormality or suspicious osseous lesion. Postsurgical changes are again noted in the right parietal bone. There is no visible canal hematoma. Other neck: The soft tissues of the neck are unremarkable. Upper chest: The imaged lung apices are clear. Review of the MIP images confirms the above findings CTA HEAD FINDINGS Anterior circulation: The intracranial ICAs are patent. The bilateral MCAs are patent. The bilateral ACAs are patent. The anterior  communicating artery is normal. There is no aneurysm or AVM to explain the patient's subarachnoid hemorrhage. Posterior circulation: The bilateral V4 segments are patent. The major cerebellar artery origins are patent. The basilar artery is patent The bilateral PCAs are patent a left posterior communicating artery. Is seen. There is no aneurysm or AVM. Venous sinuses: Patent. Anatomic variants: None. Review of the MIP images confirms the above findings IMPRESSION: Patent vasculature of the head and neck with no hemodynamically significant stenosis, occlusion, dissection, aneurysm, or AVM. Electronically Signed   By: PValetta MoleM.D.   On: 04/26/2022 13:49   CT HEAD WO CONTRAST  Result Date: 04/26/2022 CLINICAL DATA:  Provided history: Mental status change, unknown cause. EXAM: CT HEAD WITHOUT CONTRAST TECHNIQUE: Contiguous axial images were obtained from the base of the skull through the vertex without intravenous contrast. RADIATION DOSE REDUCTION: This exam was performed according to the departmental dose-optimization program which includes automated exposure control, adjustment of the mA and/or kV according to patient size and/or use of iterative reconstruction technique. COMPARISON:  Brain MRI 12/28/2021. Prior head CT examinations 12/28/2021 and earlier. FINDINGS: Brain: Mild generalized parenchymal atrophy. Acute subarachnoid hemorrhage overlying the left  greater than right parietooccipital lobes, overall moderate in volume. Redemonstrated chronic focus of encephalomalacia/gliosis within the right frontoparietal lobes. As before, there is a superimposed 9 mm ovoid hyperdense nodular focus, likely reflecting dystrophic calcification. Background mild patchy and ill-defined hypoattenuation within the cerebral white matter, nonspecific but compatible chronic small vessel ischemic disease. Known chronic lacunar infarcts within the left thalamus, better appreciated on the prior MRI. Multiple small chronic  infarcts within the bilateral cerebral hemispheres, many of which were also better appreciated on the prior MRI. Subtle focus of thickening along the posterior falx measuring 3 x 8 mm, unchanged and suspicious for a small meningioma (series 3, image 20) (series 5, image 52). No midline shift or hydrocephalus. Vascular: No hyperdense vessel.  Atherosclerotic calcifications. Skull: Right parietal cranioplasty. No acute fracture or aggressive osseous lesion. Sinuses/Orbits: No mass or acute finding within the imaged orbits. Small mucous retention cysts within the right sphenoid sinus. Impression # called by telephone at the time of interpretation on 04/26/2022 at 11:51 am to provider [facility] , who verbally acknowledged these results. IMPRESSION: 1. Acute subarachnoid hemorrhage overlying the left greater than right parietooccipital lobes, overall moderate in volume. 2. Redemonstrated focus of chronic encephalomalacia/gliosis within the right frontoparietal lobes. As before, there is a superimposed 9 mm hyperdense nodular focus at this site, likely reflecting dystrophic calcification. 3. Background mild chronic small vessel ischemic changes within the cerebral white matter. 4. Known chronic infarcts within the left thalamus and bilateral cerebellar hemispheres. 5. Subtle focus of thickening along the posterior falx measuring 3 x 8 mm, unchanged and suspicious for a small meningioma. 6. Mild generalized parenchymal atrophy. Electronically Signed   By: Kellie Simmering D.O.   On: 04/26/2022 11:58   DG Chest Port 1 View  Result Date: 04/26/2022 CLINICAL DATA:  Altered mental status EXAM: PORTABLE CHEST 1 VIEW COMPARISON:  2019 FINDINGS: No new consolidation or edema. Mild cardiomegaly. No pleural effusion or pneumothorax. IMPRESSION: No acute process in the chest.  Mild cardiomegaly. Electronically Signed   By: Macy Mis M.D.   On: 04/26/2022 10:02      Scheduled Meds:  amLODipine  5 mg Oral QHS    atorvastatin  40 mg Oral Daily   docusate sodium  100 mg Oral QHS   gabapentin  100 mg Oral QHS   pantoprazole  40 mg Oral Daily   Continuous Infusions:     LOS: 1 day     Dessa Phi, DO [facility] 04/27/2022, 1:09 PM   Available via Epic secure chat 7am-7pm After these hours, please refer to coverage provider listed on [email or web address]

## 2022-04-27 NOTE — Progress Notes (Signed)
  Transition of Care [facility]) Screening Note   Patient Details  Name: [name] Date of Birth: 06/23/1946   Transition of Care [facility]) CM/SW Contact:    Pollie Friar, RN Phone Number: 04/27/2022, 2:09 PM   Patient is from home. CIR to assess for potential rehab admission. Transition of Care Department [facility]) has reviewed patient. We will continue to monitor patient advancement through interdisciplinary progression rounds. If new patient transition needs arise, please place a TOC consult.

## 2022-04-27 NOTE — Evaluation (Signed)
Clinical/Bedside Swallow Evaluation Patient Details  Name: [name] MRN: [id number] Date of Birth: July 07, 1946  Today's Date: 04/27/2022 Time: SLP Start Time (ACUTE ONLY): 91 SLP Stop Time (ACUTE ONLY): 0745 SLP Time Calculation (min) (ACUTE ONLY): 20 min  Past Medical History:  Past Medical History:  Diagnosis Date   Abnormality of gait 09/07/2016   Allergy    Diabetes mellitus without complication ([place])    Patient denies this - notes history of glucose intolerance   GERD (gastroesophageal reflux disease)    Hemiparesis and alteration of sensations as late effects of stroke ([place]) 09/07/2016   History of pneumonia 1997   Hypertension    S/P MVR (mitral valve replacement)    Mechanical mitral valve replacement at age 65 (done in [place])  // echo 7/17: EF 55-60, normal wall motion, bileaflet mechanical mitral valve prosthesis functioning normally, mild LAE, mildly reduced RVSF, small pericardial effusion   Stroke ([place]) 1997, 2013, 2015   Past Surgical History:  Past Surgical History:  Procedure Laterality Date   ABDOMINAL HYSTERECTOMY     [name]   HPI:  76 yo with h/o multiple strokes adm to [facility] health with confusion and speech deficits.  Pt has h/o medical history of MR status post mechanical mitral valve replacement, strokes status post hemiparesis, HTN, HLD.  Per MD notes "She was to have injection hip problem and is scheduled to have an injection this coming Thursday.  Her Coumadin has been held since Friday.  Yesterday, patient was started on Lovenox injections 1 mg/kilogram twice daily, she received 2 injections total yesterday at 8 AM and 8 PM by her husband.  She went to sleep and woke up this morning and was found to be confused and had slurred speech."  Imaging showed SAH overlying the left greater than right parietaoccipital lobes, overall moderate in volume.  Pt failed a Yale and  swallow eval ordered.    Assessment / Plan / Recommendation  Clinical Impression  Patient presents with functional oropharyngeal swallow ability without incidation of aspiration or penetration over all consistencies tested. Her swallow is timely with liquids and she easily passed Yale swallow screen at this time.  No oral retention of solids or purees.  Pt denies dysphagia PTA.  She does admit to difficulties with speech PTA.  Pt does demonstrate impaired expressive and receptive language skills as well as motor planning difficulties. She will benefit from assistance for feeding due to these factors.  Recommend regular/thin diet.  No follow up for swallowing, however she will benefit from cognitive linguistic evaluation that is also ordered.  Transporter arrived to take pt to CT prior to session being completed, will continue efforts for SLE. SLP Visit Diagnosis: Dysphagia, unspecified (R13.10)    Aspiration Risk  Mild aspiration risk    Diet Recommendation Regular;Thin liquid   Liquid Administration via: Cup;Straw Medication Administration: Whole meds with liquid Supervision: Staff to assist with self feeding Compensations: Slow rate;Small sips/bites Postural Changes: Seated upright at 90 degrees;Remain upright for at least 30 minutes after po intake    Other  Recommendations Oral Care Recommendations: Oral care BID    Recommendations for follow up therapy are one component of a multi-disciplinary discharge planning process, led by the attending physician.  Recommendations may be updated based on patient status, additional functional criteria and insurance authorization.  Follow up Recommendations Follow physician's recommendations for discharge  plan and follow up therapies TBD     Assistance Recommended at Discharge Frequent or constant Supervision/Assistance  Functional Status Assessment Patient has not had a recent decline in their functional status  Frequency and Duration     TBD  after cog eval       Prognosis Barriers to Reach Goals: Cognitive deficits      Swallow Study   General Date of Onset: 04/27/22 HPI: 76 yo with h/o multiple strokes adm to [facility] health with confusion and speech deficits.  Pt has h/o medical history of MR status post mechanical mitral valve replacement, strokes status post hemiparesis, HTN, HLD.  Per MD notes "She was to have injection hip problem and is scheduled to have an injection this coming Thursday.  Her Coumadin has been held since Friday.  Yesterday, patient was started on Lovenox injections 1 mg/kilogram twice daily, she received 2 injections total yesterday at 8 AM and 8 PM by her husband.  She went to sleep and woke up this morning and was found to be confused and had slurred speech."  Imaging showed SAH overlying the left greater than right parietaoccipital lobes, overall moderate in volume.  Pt failed a Yale and swallow eval ordered. Type of Study: Bedside Swallow Evaluation Diet Prior to this Study: Regular;Thin liquids Temperature Spikes Noted: No Respiratory Status: Room air History of Recent Intubation: No Behavior/Cognition: Alert;Cooperative;Pleasant mood;Distractible;Requires cueing Oral Cavity Assessment: Within Functional Limits Oral Care Completed by SLP: Yes (provided pt toothbrush/paste and faciliated dental brushing) Oral Cavity - Dentition: Adequate natural dentition Vision: Functional for self-feeding Self-Feeding Abilities: Needs assist (due to motor planning deficits) Patient Positioning: Upright in bed Baseline Vocal Quality: Normal Volitional Cough: Strong    Oral/Motor/Sensory Function Overall Oral Motor/Sensory Function: Within functional limits (some discoordination)   Ice Chips Ice chips: Not tested   Thin Liquid Thin Liquid: Within functional limits Presentation: Straw    Nectar Thick Nectar Thick Liquid: Not tested   Honey Thick Honey Thick Liquid: Not tested   Puree Puree: Within functional  limits Presentation: Spoon   Solid     Solid: Within functional limits Presentation: Self Fed Other Comments: SLP had to place cracker in pt's right hand and cue her to look to SLP - cracker in front of SLP allowing pt to see it      Macario Golds 04/27/2022,8:10 AM  Kathleen Lime, MS [place] Office [phone] Pager [phone]

## 2022-04-28 DIAGNOSIS — I609 Nontraumatic subarachnoid hemorrhage, unspecified: Secondary | ICD-10-CM | POA: Diagnosis not present

## 2022-04-28 DIAGNOSIS — Z952 Presence of prosthetic heart valve: Secondary | ICD-10-CM

## 2022-04-28 LAB — HEMOGLOBIN AND HEMATOCRIT, BLOOD
HCT: 37.4 % (ref 36.0–46.0)
Hemoglobin: 12 g/dL (ref 12.0–15.0)

## 2022-04-28 MED ORDER — HYDRALAZINE HCL 25 MG PO TABS: 25.0000 mg | ORAL_TABLET | ORAL | Status: DC | PRN

## 2022-04-28 MED ORDER — LABETALOL HCL 5 MG/ML IV SOLN: 10.0000 mg | INTRAVENOUS | Status: DC | PRN

## 2022-04-28 MED ORDER — SODIUM CHLORIDE 0.9 % IV BOLUS
1000.0000 mL | Freq: Once | INTRAVENOUS | Status: AC
  Administered 2022-04-28: 1000 mL via INTRAVENOUS

## 2022-04-28 NOTE — Evaluation (Signed)
Occupational Therapy Evaluation Patient Details Name: [name] MRN: [id number] DOB: Aug 21, 1946 Today's Date: 04/28/2022   History of Present Illness Pt is a 76 y/o F presenting to ED with confusion and slurred speech, head CT revealing acute SAH L > R parieto-occipital lobes overall in moderate volume. PMH includes MR s/p mechanical mitral valve replacement, CVA s/p hemiparesis, HTN, and HLD.   Clinical Impression   Pt using a cane at baseline for mobility and is overall ind with ADLs, reports sponge bathing vs getting in shower/tub at home. Pt lives with spouse who can provide assist at d/c. Pt currently A & O x4, however perseverative about the room temperature along with needing high socks to wear with AFO brace. Pt following commands with increased time, and presents with L > R UE weakness, difficulty with fine motor tasks bilaterally. Pt overall min -modA for ADLs this session, min guard for bed mobility, and min A for step pivot transfer to chair using RW with mod cuing for safety and sequencing. Pt presenting with impairments listed below, will follow acutely. Recommend AIR at d/c.     Recommendations for follow up therapy are one component of a multi-disciplinary discharge planning process, led by the attending physician.  Recommendations may be updated based on patient status, additional functional criteria and insurance authorization.   Follow Up Recommendations  Acute inpatient rehab (3hours/day)    Assistance Recommended at Discharge Frequent or constant Supervision/Assistance  Patient can return home with the following A lot of help with walking and/or transfers;A lot of help with bathing/dressing/bathroom;Assistance with cooking/housework;Direct supervision/assist for medications management;Direct supervision/assist for financial management;Assist for transportation;Help with stairs or ramp for entrance    Functional Status Assessment  Patient has had a recent decline in  their functional status and demonstrates the ability to make significant improvements in function in a reasonable and predictable amount of time.  Equipment Recommendations  None recommended by OT;Other (comment) (defer to next venue of care)    Recommendations for Other Services PT consult;Rehab consult     Precautions / Restrictions Precautions Precautions: Fall Restrictions Weight Bearing Restrictions: No      Mobility Bed Mobility Overal bed mobility: Needs Assistance Bed Mobility: Supine to Sit     Supine to sit: Min guard     General bed mobility comments: cues to sit EOB    Transfers Overall transfer level: Needs assistance Equipment used: Rolling walker (2 wheels) Transfers: Sit to/from Stand, Bed to chair/wheelchair/BSC Sit to Stand: Min assist     Step pivot transfers: Min assist     General transfer comment: increased cuing, pt perseverates during tasks      Balance Overall balance assessment: Needs assistance Sitting-balance support: Feet supported Sitting balance-Leahy Scale: Fair Sitting balance - Comments: can don shoes without LOB Postural control: Posterior lean Standing balance support: During functional activity, Reliant on assistive device for balance Standing balance-Leahy Scale: Poor Standing balance comment: reliant on external support                           ADL either performed or assessed with clinical judgement   ADL Overall ADL's : Needs assistance/impaired Eating/Feeding: Minimal assistance;Sitting   Grooming: Minimal assistance;Sitting   Upper Body Bathing: Minimal assistance;Sitting   Lower Body Bathing: Sitting/lateral leans;Moderate assistance   Upper Body Dressing : Minimal assistance;Sitting   Lower Body Dressing: Sitting/lateral leans;Moderate assistance Lower Body Dressing Details (indicate cue type and reason): able to don shoe  with AFO brace also, assist for tying/fine motor aspect of task Toilet  Transfer: Minimal assistance;Rolling walker (2 wheels);Stand-pivot Armed forces technical officer Details (indicate cue type and reason): simulated to chair Toileting- Clothing Manipulation and Hygiene: Minimal assistance       Functional mobility during ADLs: Minimal assistance;Rolling walker (2 wheels);Cueing for safety;Cueing for sequencing       Vision Baseline Vision/History: 1 Wears glasses Additional Comments: questionable mild L inattn, will further assess     Perception     Praxis      Pertinent Vitals/Pain Pain Assessment Pain Assessment: No/denies pain     Hand Dominance Right   Extremity/Trunk Assessment Upper Extremity Assessment RUE Deficits / Details: 4/5 strength, tremor noted with fine motor tasks RUE Sensation: WNL RUE Coordination: decreased fine motor LUE Deficits / Details: 3-/5 strength. drawn into mild flexion and unattended to.  increased tone, moved generally in synergy. Able to open/close hand, difficulty with in hand-manipulation tasks compared to R hand LUE Coordination: decreased fine motor;decreased gross motor   Lower Extremity Assessment Lower Extremity Assessment: Defer to PT evaluation   Cervical / Trunk Assessment Cervical / Trunk Assessment: Normal   Communication Communication Communication: Receptive difficulties;Expressive difficulties   Cognition Arousal/Alertness: Awake/alert Behavior During Therapy: WFL for tasks assessed/performed Overall Cognitive Status: Impaired/Different from baseline Area of Impairment: Attention, Orientation, Following commands, Awareness, Safety/judgement, Problem solving                 Orientation Level: Disoriented to, Situation Current Attention Level: Sustained   Following Commands: Follows one step commands with increased time Safety/Judgement: Decreased awareness of safety, Decreased awareness of deficits Awareness: Emergent Problem Solving: Slow processing, Decreased initiation, Difficulty  sequencing, Requires verbal cues, Requires tactile cues General Comments: A & O x4, perseverative  in conversation about the room temperature and needing long socks when donning shoes with AFO     General Comments  VSS on RA; spouse present in room at end of session    Exercises     Shoulder Instructions      Home Living Family/patient expects to be discharged to:: Private residence Living Arrangements: Spouse/significant other Available Help at Discharge: Available 24 hours/day;Family Type of Home: House Home Access: Stairs to enter [employer] of Steps: 1   Home Layout: Two level;Able to live on main level with bedroom/bathroom Alternate Level Stairs-Number of Steps: flight Alternate Level Stairs-Rails: Right;Left Bathroom Shower/Tub: Teacher, early years/pre: Standard Bathroom Accessibility: Yes How Accessible: Accessible via walker Home Equipment: [place] - quad   Additional Comments: outpatient therapy      Prior Functioning/Environment Prior Level of Function : Needs assist       Physical Assist : Mobility (physical);ADLs (physical)     Mobility Comments: used quad cane in the home, generally mod I with mobility.  Occasionally went out with husband to run errands.  Could push the cart for short distances. ADLs Comments: took sponge baths in lieu of getting into the tub.        OT Problem List: Decreased strength;Decreased range of motion;Decreased activity tolerance;Impaired balance (sitting and/or standing);Decreased cognition;Decreased coordination;Decreased safety awareness;Impaired UE functional use      OT Treatment/Interventions: Therapeutic exercise;Therapeutic activities;Balance training;Patient/family education;Cognitive remediation/compensation;Visual/perceptual remediation/compensation;Neuromuscular education;Energy conservation;Self-care/ADL training;DME and/or AE instruction    OT Goals(Current goals can be found in the care plan  section) Acute Rehab OT Goals Patient Stated Goal: none stated OT Goal Formulation: With patient Time For Goal Achievement: 05/12/22 Potential to Achieve Goals: Good ADL Goals Pt  Will Perform Grooming: with supervision;standing Pt Will Perform Upper Body Dressing: with supervision;sitting Pt Will Perform Lower Body Dressing: with supervision;sitting/lateral leans;sit to/from stand Pt Will Transfer to Toilet: with supervision;ambulating;regular height toilet Additional ADL Goal #1: Pt will acurately peform 3 step trailmaking task in prep for ADLs  OT Frequency: Min 2X/week    Co-evaluation              AM-PAC OT "6 Clicks" Daily Activity     Outcome Measure Help from another person eating meals?: A Little Help from another person taking care of personal grooming?: A Little Help from another person toileting, which includes using toliet, bedpan, or urinal?: A Lot Help from another person bathing (including washing, rinsing, drying)?: A Lot Help from another person to put on and taking off regular upper body clothing?: A Lot Help from another person to put on and taking off regular lower body clothing?: A Lot 6 Click Score: 14   End of Session Equipment Utilized During Treatment: Gait belt;Rolling walker (2 wheels);Other (comment) (LLE AFO) Nurse Communication: Mobility status  Activity Tolerance: Patient tolerated treatment well Patient left: in chair;with call bell/phone within reach;with chair alarm set;with family/visitor present  OT Visit Diagnosis: Unsteadiness on feet (R26.81);Other abnormalities of gait and mobility (R26.89);Muscle weakness (generalized) (M62.81);Other symptoms and signs involving cognitive function;Other symptoms and signs involving the nervous system (R29.898);Cognitive communication deficit (R41.841)                Time: 6333-5456 OT Time Calculation (min): 31 min Charges:  OT General Charges $OT Visit: 1 Visit OT Evaluation $OT Eval Moderate  Complexity: 1 Mod OT Treatments $Self Care/Home Management : 8-22 mins  Lynnda Child, OTD, OTR/L Acute Rehab [phone]) [phone] - [place] 04/28/2022, 5:14 PM

## 2022-04-28 NOTE — Progress Notes (Signed)
  Inpatient Rehabilitation Admissions Coordinator   Met with patient and spouse at bedside for rehab assessment. We discussed goals and expectations of a possible CIR admit. They prefer CIR for rehab and have been to CIR in 2021. Sees Dr Aretta Nip as an outpatient.  Family can provide expected caregiver support that is recommended of supervision level. She could benefit from CIR admit. I await bed availability to admit.Marland Kitchen Please call me with any questions.   Danne Baxter, RN, MSN Rehab Admissions Coordinator [phone]

## 2022-04-28 NOTE — Hospital Course (Signed)
[name] is a 76 y.o. 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  She has chronic R hip pain and was to have an injection on 6/22. Her coumadin was held and she was placed on a lovenox bridge until procedure which was ongoing on admission.   CT head showed an acute subarachnoid hemorrhage overlying left greater than right parieto-occipital lobes. Anticoagulation was held but no reversal agents were used on admission.  She was admitted for ongoing observation and repeat CT head along with neurosurgery evaluation.

## 2022-04-28 NOTE — Progress Notes (Signed)
Physical Therapy Treatment Patient Details Name: [name] MRN: [id number] DOB: 01-Aug-1946 Today's Date: 04/28/2022   History of Present Illness Pt is a 76 y/o F presenting to ED with confusion and slurred speech, head CT revealing acute SAH L > R parieto-occipital lobes overall in moderate volume. PMH includes MR s/p mechanical mitral valve replacement, CVA s/p hemiparesis, HTN, and HLD.    PT Comments    Progressing steadily toward goals, limited by decrease motor planning, mild ataxia and communication issues.  Emphasis today on scooting to EOB in prep for standing, sit to stand safety and technique.  Pre-gait activity work on Lexicographer initial steps forward in the RW, work on w/shift and coordinated steps into the RW, progression of gait with RW into the hall for approx 40 feet.  At that point pt's ability to step forward with L>R LE degraded with pt starting to step up onto and out side the RW with the L LE before becoming altered in her responsiveness.  Pt was placed in a chair, then rolled to the room where ultimately she needed to end up in the bed due to no longer safe for her to sit up.  RN witness the event, helped return to bed and relayed events to the MD.     Recommendations for follow up therapy are one component of a multi-disciplinary discharge planning process, led by the attending physician.  Recommendations may be updated based on patient status, additional functional criteria and insurance authorization.  Follow Up Recommendations  Acute inpatient rehab (3hours/day)     Assistance Recommended at Discharge Frequent or constant Supervision/Assistance  Patient can return home with the following A little help with walking and/or transfers;A little help with bathing/dressing/bathroom;Assistance with cooking/housework;Assist for transportation;Help with stairs or ramp for entrance   Equipment Recommendations  Other (comment);Rolling walker (2 wheels) (TBA next venue)     Recommendations for Other Services       Precautions / Restrictions Precautions Precautions: Fall     Mobility  Bed Mobility Overal bed mobility: Needs Assistance Bed Mobility: Sit to Supine       Sit to supine: Mod assist        Transfers Overall transfer level: Needs assistance   Transfers: Sit to/from Stand Sit to Stand: Min assist           General transfer comment: cues for hand placement, assist to get symmetrically at edge of the chair, assist for coming forward and for boost.    Ambulation/Gait Ambulation/Gait assistance: Mod assist Gait Distance (Feet): 40 Feet Assistive device: Rolling walker (2 wheels) Gait Pattern/deviations: Step-to pattern       General Gait Details: pt appeard initially to have motor planning and execution problems throughout the gait trial, worsening at about 40 feet with onset of ?BP issues vs a neurological event.  Initially pt would haltingly start to move her L LE forward to command only to step it off to the L under or outside the RW OR pick of the L LE to put it on the RW.  Later, this became a consistent behavior, needing to have L LE block to only be able to step forward. Lastly the R LE start also no being able to step forward coordinatedly.  Pt then given a chair to sit in and over 20-30 secs became less responsive with large pupils.  RN witness.  Pt rolled back to chair then bed when sitting not appropriate.   Stairs  Wheelchair Mobility    Modified Rankin (Stroke Patients Only)       Balance Overall balance assessment: Needs assistance Sitting-balance support: Feet supported Sitting balance-Leahy Scale: Fair Sitting balance - Comments: Initially able to balance at EOB, after her incident in the hall pt unable to sit up.   Standing balance support: During functional activity, Reliant on assistive device for balance Standing balance-Leahy Scale: Poor Standing balance comment: reliant on  external support                            Cognition Arousal/Alertness: Awake/alert Behavior During Therapy: WFL for tasks assessed/performed Overall Cognitive Status: Impaired/Different from baseline Area of Impairment: Attention, Orientation, Following commands, Awareness, Safety/judgement, Problem solving                 Orientation Level: Disoriented to, Situation Current Attention Level: Sustained   Following Commands: Follows one step commands with increased time Safety/Judgement: Decreased awareness of safety, Decreased awareness of deficits Awareness: Emergent Problem Solving: Slow processing, Decreased initiation, Difficulty sequencing, Requires verbal cues, Requires tactile cues          Exercises      General Comments        Pertinent Vitals/Pain Pain Assessment Pain Assessment: Faces Faces Pain Scale: No hurt Pain Intervention(s): Monitored during session    Home Living     Available Help at Discharge: Available 24 hours/day;Family Type of Home: House             Additional Comments: outpatient therapy    Prior Function            PT Goals (current goals can now be found in the care plan section) Acute Rehab PT Goals PT Goal Formulation: Patient unable to participate in goal setting Time For Goal Achievement: 05/11/22 Potential to Achieve Goals: Good Progress towards PT goals: Progressing toward goals    Frequency    Min 3X/week      PT Plan Current plan remains appropriate    Co-evaluation              AM-PAC PT "6 Clicks" Mobility   Outcome Measure  Help needed turning from your back to your side while in a flat bed without using bedrails?: A Lot Help needed moving from lying on your back to sitting on the side of a flat bed without using bedrails?: A Lot Help needed moving to and from a bed to a chair (including a wheelchair)?: A Lot Help needed standing up from a chair using your arms (e.g., wheelchair  or bedside chair)?: A Little Help needed to walk in hospital room?: A Lot Help needed climbing 3-5 steps with a railing? : A Lot 6 Click Score: 13    End of Session   Activity Tolerance: Treatment limited secondary to medical complications (Comment) (lowered responsiveness during gait trial.) Patient left: in bed;with call bell/phone within reach;with bed alarm set;with nursing/sitter in room Nurse Communication: Mobility status PT Visit Diagnosis: Other abnormalities of gait and mobility (R26.89);Other symptoms and signs involving the nervous system (R29.898)     Time: 7893-8101 PT Time Calculation (min) (ACUTE ONLY): 31 min  Charges:  $Gait Training: 8-22 mins $Therapeutic Activity: 8-22 mins                     04/28/2022  Ginger Carne., PT Acute Rehabilitation Services [phone]  (pager) [phone]  (office)   Tessie Fass Yosselin Zoeller 04/28/2022, 5:04 PM

## 2022-04-28 NOTE — Assessment & Plan Note (Addendum)
-   Per husband, valve replaced December 1997 - Compliant with Coumadin - okay to resume Coumadin with Lovenox bridge today, 6/22

## 2022-04-28 NOTE — Plan of Care (Signed)
  Problem: Education: Goal: Knowledge of General Education information will improve Description: Including pain rating scale, medication(s)/side effects and non-pharmacologic comfort measures Outcome: Progressing   Problem: Nutrition: Goal: Adequate nutrition will be maintained Outcome: Progressing   Problem: Safety: Goal: Ability to remain free from injury will improve Outcome: Progressing   Problem: Education: Goal: Knowledge of disease or condition will improve Outcome: Progressing Goal: Knowledge of secondary prevention will improve (SELECT ALL) Outcome: Progressing Goal: Knowledge of patient specific risk factors will improve (INDIVIDUALIZE FOR PATIENT) Outcome: Progressing   Problem: Coping: Goal: Will identify appropriate support needs Outcome: Progressing   Problem: Self-Care: Goal: Verbalization of feelings and concerns over difficulty with self-care will improve Outcome: Progressing Goal: Ability to communicate needs accurately will improve Outcome: Progressing

## 2022-04-28 NOTE — Plan of Care (Signed)
  Problem: Education: Goal: Knowledge of disease or condition will improve Outcome: Not Progressing Goal: Knowledge of patient specific risk factors will improve (INDIVIDUALIZE FOR PATIENT) Outcome: Not Progressing   Problem: Self-Care: Goal: Verbalization of feelings and concerns over difficulty with self-care will improve Outcome: Not Progressing Goal: Ability to communicate needs accurately will improve Outcome: Not Progressing

## 2022-04-28 NOTE — Progress Notes (Signed)
Progress Note    [name]   TGG:[id number]  DOB: September 01, 1946  DOA: 04/26/2022     2 PCP: Haywood Pao, MD  Initial CC: speech difficulty  Hospital Course: [name] is a 76 y.o. 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  She has chronic R hip pain and was to have an injection on 6/22. Her coumadin was held and she was placed on a lovenox bridge until procedure which was ongoing on admission.   CT head showed an acute subarachnoid hemorrhage overlying left greater than right parieto-occipital lobes. Anticoagulation was held but no reversal agents were used on admission.  She was admitted for ongoing observation and repeat CT head along with neurosurgery evaluation.  Interval History:  Husband present bedside this morning.  Her speech has improved since admission.  She is undergoing evaluation for CIR.  Assessment and Plan: * SAH (subarachnoid hemorrhage) ([place]) - No trauma prior to admission.  Etiology suspected due to anticoagulation use - Patient has been on Lovenox bridge for outpatient procedure with Ortho planned for 04/29/2022.  Coumadin has been on hold.  INR 1.7 on admission -Repeat CT head performed on 04/27/2022 shows ongoing [place] with slight interval increase involving left parietal cortex; right parietal cortex noted to be stable and unchanged -Discussed with neurosurgery.  Plan is for holding anticoagulation for 72 hours then resuming - neurologically has improved since admission (improved dysarthria/aphasia) - maintain BP control   H/O mitral valve replacement with mechanical valve - Per husband, valve replaced December 1997 - Compliant with Coumadin -Plan will be for resuming Coumadin with Lovenox bridge on 04/29/2022  Prediabetes - A1c 6.1% on 04/26/2022 - Continue diet control  HLD (hyperlipidemia) - Continue Lipitor  HTN (hypertension) - Continue Norvasc    Old records  reviewed in assessment of this patient  Antimicrobials:   DVT prophylaxis:  SCD's Start: 04/26/22 1534   Code Status:   Code Status: Full Code  Disposition Plan: Pending CIR eval Status is: Inpatient  Objective: Blood pressure 140/79, pulse 88, temperature 98 F (36.7 C), temperature source Oral, resp. rate 16, height '5\' 7"'$  (1.702 m), weight 59 kg, SpO2 96 %.  Examination:  Physical Exam Constitutional:      General: She is not in acute distress.    Appearance: Normal appearance.  HENT:     Head: Normocephalic and atraumatic.     Mouth/Throat:     Mouth: Mucous membranes are moist.  Eyes:     Extraocular Movements: Extraocular movements intact.     Pupils: Pupils are equal, round, and reactive to light.  Cardiovascular:     Rate and Rhythm: Normal rate and regular rhythm.  Pulmonary:     Effort: Pulmonary effort is normal.     Breath sounds: Normal breath sounds.  Abdominal:     General: Bowel sounds are normal. There is no distension.     Palpations: Abdomen is soft.     Tenderness: There is no abdominal tenderness.  Musculoskeletal:        General: Normal range of motion.     Cervical back: Normal range of motion and neck supple.  Skin:    General: Skin is warm and dry.  Neurological:     Mental Status: She is alert and oriented to person, place, and time.  Psychiatric:        Mood and Affect: Mood normal.      Consultants:  Neurosurgery  Procedures:    Data Reviewed: No results found for this or any previous visit (from the past 24 hour(s)).  I have Reviewed nursing notes, Vitals, and Lab results since pt's last encounter. Pertinent lab results : see above I have ordered test including BMP, CBC, Mg I have reviewed the last note from staff over past 24 hours I have discussed pt's care plan and test results with nursing staff, case manager   LOS: 2 days   Dwyane Dee, MD [facility] 04/28/2022, 12:53 PM

## 2022-04-28 NOTE — Evaluation (Signed)
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   Anticoagulated on warfarin    Left hemiparesis ([place])    History of CVA with residual deficit    Thrombocytopenia ([place])    Prediabetes    Vertigo 09/24/2017   Benign paroxysmal positional vertigo    History of stroke    HLD (hyperlipidemia) 08/23/2017   Encounter for therapeutic drug monitoring 08/08/2017   Long term (current) use of anticoagulants [Z79.01] 07/12/2017   Hemiparesis and alteration of sensations as late effects of stroke ([place]) 09/07/2016   Abnormality of gait 09/07/2016   HTN (hypertension) 04/13/2016   H/O mitral valve replacement with mechanical valve 10/16/2015   Diabetes mellitus without complication (HCC)    Hemiparesis (L sided - mild) due to old stroke [facility])    Past Medical History:  Past Medical History:  Diagnosis Date   Abnormality of gait 09/07/2016   Allergy    Diabetes mellitus without complication ([place])    Patient denies this - notes history of glucose intolerance   GERD (gastroesophageal reflux disease)    Hemiparesis and alteration of sensations as late effects of stroke ([place]) 09/07/2016   History of pneumonia 1997   Hypertension    S/P MVR (mitral valve replacement)    Mechanical mitral valve replacement at age 15 (done in [place])  // echo 7/17: EF 55-60, normal wall motion, bileaflet mechanical mitral valve prosthesis functioning normally, mild LAE, mildly reduced RVSF, small pericardial effusion   Stroke ([place]) 1997, 2013, 2015   Past Surgical History:  Past  Surgical History:  Procedure Laterality Date   ABDOMINAL HYSTERECTOMY     BRAIN SURGERY     BUNIONECTOMY  1993   CARDIAC VALVE REPLACEMENT  1997   TOE SURGERY  1996   HPI:  76 yo with h/o multiple strokes adm to [facility] health with confusion and speech deficits.  Pt has h/o medical history of MR status post mechanical mitral valve replacement, strokes status post hemiparesis, HTN, HLD.  Per MD notes "She was to have injection hip problem and is scheduled to have an injection this coming Thursday.  Her Coumadin has been held since Friday.  Yesterday, patient was started on Lovenox injections 1 mg/kilogram twice daily, she received 2 injections total yesterday at 8 AM and 8 PM by her husband.  She went to sleep and woke up this morning and was found to be confused and had slurred speech."  Imaging showed SAH overlying the left greater than right parietaoccipital lobes, overall moderate in volume.  Pt failed a [facility] and swallow eval ordered.   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 She was able to engage in simple conversation, often tangential in nature, which was frequented by word finding difficulties. Structured naming tasks were completed with 80-100% accuracy with primary difficulty with convergent naming. Receptively, she demonstrated reduced ability to follow 2 step commands (50%) and answer mod-complex y/n questions including during paragraph level comprehension task. She was unable to write name accurately and would often report she "forgot" what she was writing  and proceed to write numbers 1-10. She demonstrated potential to copy and trace single letters and wrote her full name with 90% accuracy when clinician dictated the letters. Speech intelligibility was roughly 90% , improved with overarticualtion and increase of vocal intensity. SLP to f/u for treatment of aphasia and dysarthria. She will likely  benefit from ongoing cognitive assessment.    SLP Assessment  SLP Recommendation/Assessment: Patient needs continued Speech Lanaguage Pathology Services SLP Visit Diagnosis: Dysarthria and anarthria (R47.1);Aphasia (R47.01)    Recommendations for follow up therapy are one component of a multi-disciplinary discharge planning process, led by the attending physician.  Recommendations may be updated based on patient status, additional functional criteria and insurance authorization.    Follow Up Recommendations  Acute inpatient rehab (3hours/day)    Assistance Recommended at Discharge  Frequent or constant Supervision/Assistance  Functional Status Assessment Patient has had a recent decline in their functional status and demonstrates the ability to make significant improvements in function in a reasonable and predictable amount of time.  Frequency and Duration min 2x/week  2 weeks      SLP Evaluation Cognition  Overall Cognitive Status: Impaired/Different from baseline Arousal/Alertness: Awake/alert Orientation Level: Oriented to person;Oriented to place;Oriented to time;Disoriented to situation Year: 2023 Month: June Day of Week: Correct Attention: Focused;Sustained Focused Attention: Appears intact Sustained Attention: Impaired Sustained Attention Impairment: Verbal basic Awareness: Impaired Awareness Impairment: Emergent impairment Behaviors: Perseveration       Comprehension  Auditory Comprehension Overall Auditory Comprehension: Impaired Yes/No Questions: Impaired Basic Biographical Questions: 76-100% accurate Basic Immediate Environment Questions: 75-100% accurate Complex Questions: 50-74% accurate Paragraph Comprehension (via yes/no questions): 51-75% accurate Commands: Impaired One Step Basic Commands: 75-100% accurate Two Step Basic Commands: 50-74% accurate Conversation: Simple Interfering Components: Attention;Processing speed EffectiveTechniques: Slowed  speech;Repetition;Extra processing time Visual Recognition/Discrimination Discrimination: Not tested Reading Comprehension Reading Status: Within funtional limits    Expression Expression Primary Mode of Expression: Verbal Verbal Expression Overall Verbal Expression: Impaired Initiation: No impairment Automatic Speech: Name;Social Response;Counting;Day of week Level of Generative/Spontaneous Verbalization: Conversation Naming: Impairment Responsive: 76-100% accurate Confrontation: Impaired Convergent: 50-74% accurate Verbal Errors: Perseveration Interfering Components: Attention Written Expression Dominant Hand: Right Written Expression: Exceptions to [facility] Copy Ability: Letter Dictation Ability: Word   Oral / Motor  Oral Motor/Sensory Function Overall Oral Motor/Sensory Function: Within functional limits (some discoordination) Motor Speech Overall Motor Speech: Impaired Respiration: Within functional limits Phonation: Normal Resonance: Hyponasality Articulation: Impaired Level of Impairment: Conversation Intelligibility: Intelligibility reduced Word: 75-100% accurate Phrase: 75-100% accurate Sentence: 75-100% accurate Conversation: 75-100% accurate             Ellwood Dense, [place], [place] Office Number: [phone]  Acie Fredrickson 04/28/2022, 1:42 PM

## 2022-04-28 NOTE — Assessment & Plan Note (Signed)
-  Continue Lipitor °

## 2022-04-28 NOTE — PMR Pre-admission (Signed)
PMR Admission Coordinator Pre-Admission Assessment  Patient: [name] is an 76 y.o., female MRN: [id number] DOB: [birth year]Height: 5\' 7"  (170.2 cm) Weight: 62.6 kg  Insurance Information HMO:     PPO:      PCP:      IPA:      80/20:      OTHER:  PRIMARY: Medicare a and b      Policy#: [id number]      Subscriber: pt Benefits:  Phone #: passport one source online     Name: 6/21 Eff. Date: 05/09/2011     Deduct: $1600      Out of Pocket Max: none      Life Max: none CIR: 100%      SNF: 20 full days Outpatient: 80%     Co-Pay: 205 Home Health: 100%      Co-Pay: none DME: 80%     Co-Pay: 20% Providers: pt choice  SECONDARY: AARP supplement      Policy#: [id number]  Financial Counselor:       Phone#:   The "Data Collection Information Summary" for patients in Inpatient Rehabilitation Facilities with attached "Privacy Act Statement-Health Care Records" was provided and verbally reviewed with: Patient and Family  Emergency Contact Information Contact Information     Name Relation Home Work Mobile   [place] Spouse [phone]  [phone]   Alroy Bailiff Daughter   [phone]      Current Medical History  Patient Admitting Diagnosis: SAH  History of Present Illness: [name] is a 76 year old right-handed female with history of diabetes mellitus, history of mitral valve replacement 1997 maintained on Coumadin, hyperlipidemia, hypertension, brain surgery with craniotomy, 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 Claudette Laws in the outpatient office.  Presented 04/26/2022 with acute onset of slurred speech as well as altered mental status.  Family denied any recent trauma or fall.  Patient does have a history of chronic hip pain was scheduled to have injection 04/29/2022 thus her Coumadin had  been held since 04/23/2022 and she had been placed on Lovenox bridge injections 1 mg/kilogram twice daily administered by her husband..  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  Chronic infarcts within the left thalamus and bilateral cerebellar hemispheres.  CT angiogram of head and neck no hemodynamically significant stenosis occlusion dissection or aneurysm.  Admission chemistries unremarkable aside glucose 155 BUN 26, hemoglobin A1c 6.1, ammonia level within normal limits, urinalysis negative nitrite, urine drug screen negative.  Hospital course discussed with neurosurgery/Dr. Jordan Likes with conservative care and repeat cranial CT scan 04/27/2022 again showing moderate amount of subarachnoid hemorrhage in left parietal cortex with slight interval increase.  Small amount of subarachnoid hemorrhage in the right parietal cortex not changed.  Plan on resuming Coumadin with Lovenox bridge 04/29/2022.  Patient tolerating a regular diet.    Complete NIHSS TOTAL: 6  Patient's medical record from [facility] has been reviewed by the rehabilitation admission coordinator and physician.  Past Medical History  Past Medical History:  Diagnosis Date   Abnormality of gait 09/07/2016   Allergy    Diabetes mellitus without complication [facility])    Patient denies this - notes history of glucose intolerance   GERD (gastroesophageal reflux disease)  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) 09/07/2016   History of pneumonia 1997   Hypertension    S/P MVR (mitral valve replacement)    Mechanical mitral valve replacement at age 1 (done in [place])  // echo 7/17: EF 55-60, normal wall motion, bileaflet mechanical mitral valve prosthesis functioning normally, mild LAE, mildly reduced RVSF, small pericardial  effusion   Stroke (HCC) 1997, 2013, 2015   Has the patient had major surgery during 100 days prior to admission? No  Family History   family history includes Cancer in her mother; Heart disease in her mother; Hyperlipidemia in her mother; Hypertension in her father and mother; Stroke in her father.  Current Medications  Current Facility-Administered Medications:    acetaminophen (TYLENOL) tablet 1,000 mg, 1,000 mg, Oral, Q6H PRN, Emeline General, MD   antiseptic oral rinse (BIOTENE) solution, , Mouth Rinse, PRN, Emeline General, MD   atorvastatin (LIPITOR) tablet 40 mg, 40 mg, Oral, Daily, Mikey College T, MD, 40 mg at 04/30/22 0802   docusate sodium (COLACE) capsule 100 mg, 100 mg, Oral, QHS, Mikey College T, MD, 100 mg at 04/29/22 2050   enoxaparin (LOVENOX) injection 62.5 mg, 1 mg/kg, Subcutaneous, Q12H, Reome, Earle J, RPH, 62.5 mg at 04/30/22 0947   gabapentin (NEURONTIN) capsule 100 mg, 100 mg, Oral, QHS, Mikey College T, MD, 100 mg at 04/29/22 2050   hydrALAZINE (APRESOLINE) tablet 25 mg, 25 mg, Oral, Q4H PRN, Lewie Chamber, MD   labetalol (NORMODYNE) injection 10 mg, 10 mg, Intravenous, Q4H PRN, Lewie Chamber, MD   pantoprazole (PROTONIX) EC tablet 40 mg, 40 mg, Oral, Daily, Mikey College T, MD, 40 mg at 04/30/22 0802   polyvinyl alcohol (LIQUIFILM TEARS) 1.4 % ophthalmic solution 1 drop, 1 drop, Both Eyes, BID PRN, Emeline General, MD   senna-docusate (Senokot-S) tablet 1 tablet, 1 tablet, Oral, QHS PRN, Emeline General, MD   [START ON 05/03/2022] warfarin (COUMADIN) tablet 2.5 mg, 2.5 mg, Oral, Q Mon, Reome, Earle J, RPH   warfarin (COUMADIN) tablet 5 mg, 5 mg, Oral, q1600, Reome, Earle J, RPH, 5 mg at 04/29/22 1538   Warfarin - Pharmacist Dosing Inpatient, , Does not apply, q1600, Reome, Elisha Headland, RPH, Given at 04/29/22 1539  Patients Current Diet:  Diet Order             Diet Heart Room service appropriate? Yes; Fluid consistency: Thin  Diet effective now                   Precautions / Restrictions Precautions Precautions: Fall Restrictions Weight Bearing Restrictions: No   Has the patient had 2 or more falls or a fall with injury in the past year? No  Prior Activity Level Community (5-7x/wk): Mod I with cane, receiving OP therapy  Prior Functional Level Self Care: Did the patient need help bathing, dressing, using the toilet or eating? Independent  Indoor Mobility: Did the patient need assistance with walking from room to room (with or without device)? Independent  Stairs: Did the patient need assistance with internal or external stairs (with or without device)? Independent  Functional Cognition: Did the patient need help planning regular tasks such as shopping or remembering to take medications? Needed some help  Patient Information Are you of Hispanic, Latino/a,or Spanish origin?: A. No, not of Hispanic, Latino/a, or Spanish origin What is your race?: A. White Do you need or want an interpreter to communicate with a doctor or health care staff?:  0. No  Patient's Response To:  Health Literacy and Transportation Is the patient able to respond to health literacy and transportation needs?: Yes Health Literacy - How often do you need to have someone help you when you read instructions, pamphlets, or other written material from your doctor or pharmacy?: Never In the past 12 months, has lack of transportation kept you from medical appointments or from getting medications?: No In the past 12 months, has lack of transportation kept you from meetings, work, or from getting things needed for daily living?: No  Home Assistive Devices / Equipment Home Assistive Devices/Equipment: Blood pressure cuff, Cane (specify quad or straight), Eyeglasses, Walker (specify type) Home Equipment: Cane - quad  Prior Device Use: Indicate devices/aids used by the patient prior to current illness, exacerbation or injury?  Cane in the community  Current Functional  Level Cognition  Arousal/Alertness: Awake/alert Overall Cognitive Status: Impaired/Different from baseline Difficult to assess due to: Impaired communication Current Attention Level: Sustained Orientation Level: Oriented to person, Oriented to place, Oriented to time, Disoriented to situation Following Commands: Follows one step commands with increased time Safety/Judgement: Decreased awareness of safety, Decreased awareness of deficits General Comments: A & O x4, perseverative  in conversation about the room temperature and needing long socks when donning shoes with AFO Attention: Focused, Sustained Focused Attention: Appears intact Sustained Attention: Impaired Sustained Attention Impairment: Verbal basic Awareness: Impaired Awareness Impairment: Emergent impairment Behaviors: Perseveration    Extremity Assessment (includes Sensation/Coordination)  Upper Extremity Assessment: RUE deficits/detail, LUE deficits/detail, Generalized weakness RUE Deficits / Details: 4/5 strength, tremor noted with fine motor tasks RUE Sensation: WNL RUE Coordination: decreased fine motor LUE Deficits / Details: 3-/5 strength. drawn into mild flexion and unattended to.  increased tone, moved generally in synergy. Able to open/close hand, difficulty with in hand-manipulation tasks compared to R hand LUE Coordination: decreased fine motor, decreased gross motor  Lower Extremity Assessment: Defer to PT evaluation RLE Deficits / Details: functional strength, mild incoordination LLE Deficits / Details: paretic, mildly increased tone, uncoordinated mildly ataxic movement    ADLs  Overall ADL's : Needs assistance/impaired Eating/Feeding: Minimal assistance, Sitting Grooming: Minimal assistance, Sitting Upper Body Bathing: Minimal assistance, Sitting Lower Body Bathing: Sitting/lateral leans, Moderate assistance Upper Body Dressing : Minimal assistance, Sitting Lower Body Dressing: Sitting/lateral leans,  Moderate assistance Lower Body Dressing Details (indicate cue type and reason): able to don shoe with AFO brace also, assist for tying/fine motor aspect of task Toilet Transfer: Minimal assistance, Rolling walker (2 wheels), Stand-pivot Toilet Transfer Details (indicate cue type and reason): simulated to chair Toileting- Clothing Manipulation and Hygiene: Minimal assistance Functional mobility during ADLs: Minimal assistance, Rolling walker (2 wheels), Cueing for safety, Cueing for sequencing    Mobility  Overal bed mobility: Needs Assistance Bed Mobility: Supine to Sit Supine to sit: Supervision, HOB elevated Sit to supine: Mod assist General bed mobility comments: cues to sit EOB    Transfers  Overall transfer level: Needs assistance Equipment used: Rolling walker (2 wheels) Transfers: Sit to/from Stand Sit to Stand: Min assist Bed to/from chair/wheelchair/BSC transfer type:: Step pivot Step pivot transfers: Min assist General transfer comment: increased cuing, pt perseverates during tasks    Ambulation / Gait / Stairs / Wheelchair Mobility  Ambulation/Gait Ambulation/Gait assistance: Editor, commissioning (Feet): 150 Feet Assistive device: Rolling walker (2 wheels) Gait Pattern/deviations: Step-through pattern, Decreased dorsiflexion - left General Gait Details: pt with slowed step-through gait, reduced step length due to impaired foot clearance. walker drifting to  left side due to LUE weakness, pt with poor awareness/attention to left side Gait velocity: reduced Gait velocity interpretation: <1.31 ft/sec, indicative of household ambulator    Posture / Balance Dynamic Sitting Balance Sitting balance - Comments: can don shoes without LOB Balance Overall balance assessment: Needs assistance Sitting-balance support: No upper extremity supported, Feet supported Sitting balance-Leahy Scale: Good Sitting balance - Comments: can don shoes without LOB Postural control: Posterior  lean Standing balance support: Bilateral upper extremity supported, Reliant on assistive device for balance Standing balance-Leahy Scale: Poor Standing balance comment: reliant on external support    Special needs/care consideration    Previous Home Environment  Living Arrangements: Spouse/significant other Available Help at Discharge: Available 24 hours/day, Family Type of Home: House Home Layout: Two level, Able to live on main level with bedroom/bathroom Alternate Level Stairs-Rails: Right, Left Alternate Level Stairs-Number of Steps: flight Home Access: Stairs to enter Secretary/administrator of Steps: 1 Bathroom Shower/Tub: Associate Professor: Yes How Accessible: Accessible via walker Home Care Services: No Additional Comments: outpatient therapy  Discharge Living Setting Plans for Discharge Living Setting: Patient's home, Lives with (comment) (spouse) Type of Home at Discharge: House Discharge Home Layout: Able to live on main level with bedroom/bathroom, Two level Alternate Level Stairs-Rails: Right, Left Alternate Level Stairs-Number of Steps: flight Discharge Home Access: Stairs to enter Entrance Stairs-Rails: None Entrance Stairs-Number of Steps: 1 Discharge Bathroom Shower/Tub: Tub/shower unit Discharge Bathroom Toilet: Standard Discharge Bathroom Accessibility: Yes How Accessible: Accessible via walker Does the patient have any problems obtaining your medications?: No  Social/Family/Support Systems Patient Roles: Spouse Contact Information: spouse, Molly Maduro Anticipated Caregiver: spouse Anticipated Industrial/product designer Information: see contacts Ability/Limitations of Caregiver: no limitations Caregiver Availability: 24/7 Discharge Plan Discussed with Primary Caregiver: Yes Is Caregiver In Agreement with Plan?: Yes Does Caregiver/Family have Issues with Lodging/Transportation while Pt is in Rehab?:  No  Goals Patient/Family Goal for Rehab: supervision to min asisst with PT, OT and SLP Expected length of stay: ELOS 10 to 12 days Pt/Family Agrees to Admission and willing to participate: Yes Program Orientation Provided & Reviewed with Pt/Caregiver Including Roles  & Responsibilities: Yes  Decrease burden of Care through IP rehab admission: n/a  Possible need for SNF placement upon discharge: not anticipated  Patient Condition: I have reviewed medical records from [facility], spoken with CM, and patient and spouse. I met with patient at the bedside for inpatient rehabilitation assessment.  Patient will benefit from ongoing PT, OT, and SLP, can actively participate in 3 hours of therapy a day 5 days of the week, and can make measurable gains during the admission.  Patient will also benefit from the coordinated team approach during an Inpatient Acute Rehabilitation admission.  The patient will receive intensive therapy as well as Rehabilitation physician, nursing, social worker, and care management interventions.  Due to bladder management, bowel management, safety, skin/wound care, disease management, medication administration, pain management, and patient education the patient requires 24 hour a day rehabilitation nursing.  The patient is currently min assist overall with mobility and basic ADLs.  Discharge setting and therapy post discharge at home with home health is anticipated.  Patient has agreed to participate in the Acute Inpatient Rehabilitation Program and will admit today.  Preadmission Screen Completed By:  Clois Dupes, 04/30/2022 11:26 AM ______________________________________________________________________   Discussed status with Dr. Carlis Abbott on 04/30/22 at 1126 and received approval for admission today.  Admission Coordinator:  Clois Dupes, RN, time  1126 Date  04/30/2022   Assessment/Plan: Diagnosis: SAH Does the need for close, 24 hr/day Medical  supervision in concert with the patient's rehab needs make it unreasonable for this patient to be served in a less intensive setting? Yes Co-Morbidities requiring supervision/potential complications: h/o mitral valve replacement, HTN, HLD, prediabetes, vertigo Due to bladder management, bowel management, safety, skin/wound care, disease management, medication administration, pain management, and patient education, does the patient require 24 hr/day rehab nursing? Yes Does the patient require coordinated care of a physician, rehab nurse, PT, OT, and SLP to address physical and functional deficits in the context of the above medical diagnosis(es)? Yes Addressing deficits in the following areas: balance, endurance, locomotion, strength, transferring, bowel/bladder control, bathing, dressing, feeding, grooming, toileting, and psychosocial support Can the patient actively participate in an intensive therapy program of at least 3 hrs of therapy 5 days a week? Yes The potential for patient to make measurable gains while on inpatient rehab is excellent Anticipated functional outcomes upon discharge from inpatient rehab: supervision PT, supervision OT, supervision SLP Estimated rehab length of stay to reach the above functional goals is: 5-7 days Anticipated discharge destination: Home 10. Overall Rehab/Functional Prognosis: excellent   MD Signature: Sula Soda, MD

## 2022-04-28 NOTE — Assessment & Plan Note (Addendum)
-   No trauma prior to admission.  Etiology suspected due to anticoagulation use - Patient has been on Lovenox bridge for outpatient procedure with Ortho planned for 04/29/2022.  Coumadin has been on hold.  INR 1.7 on admission -Repeat CT head performed on 04/27/2022 shows ongoing [place] with slight interval increase involving left parietal cortex; right parietal cortex noted to be stable and unchanged -Discussed with neurosurgery.  Plan is for holding anticoagulation for 72 hours then resuming 6/22 - neurologically has improved since admission (improved dysarthria/aphasia) - maintain BP control  - patient notes some dizziness after walking some; negative orthostatic BP check on 6/22. Symptoms likely related to [facility] and should improve slowly with time

## 2022-04-28 NOTE — Assessment & Plan Note (Addendum)
-   Hold Norvasc to allow some further improvement in BP, plus became hypotensive on 6/21 requiring fluid bolus

## 2022-04-28 NOTE — Assessment & Plan Note (Signed)
-   A1c 6.1% on 04/26/2022 - Continue diet control   

## 2022-04-29 ENCOUNTER — Encounter: Payer: Medicare Other | Admitting: Physical Medicine & Rehabilitation

## 2022-04-29 ENCOUNTER — Encounter (HOSPITAL_COMMUNITY): Payer: Self-pay | Admitting: Internal Medicine

## 2022-04-29 DIAGNOSIS — Z952 Presence of prosthetic heart valve: Secondary | ICD-10-CM | POA: Diagnosis not present

## 2022-04-29 DIAGNOSIS — I609 Nontraumatic subarachnoid hemorrhage, unspecified: Secondary | ICD-10-CM | POA: Diagnosis not present

## 2022-04-29 LAB — CBC WITH DIFFERENTIAL/PLATELET
Abs Immature Granulocytes: 0.02 10*3/uL (ref 0.00–0.07)
Basophils Absolute: 0 10*3/uL (ref 0.0–0.1)
Basophils Relative: 0 %
Eosinophils Absolute: 0 10*3/uL (ref 0.0–0.5)
Eosinophils Relative: 1 %
HCT: 35.2 % — ABNORMAL LOW (ref 36.0–46.0)
Hemoglobin: 11.8 g/dL — ABNORMAL LOW (ref 12.0–15.0)
Immature Granulocytes: 0 %
Lymphocytes Relative: 23 %
Lymphs Abs: 1.4 10*3/uL (ref 0.7–4.0)
MCH: 29.1 pg (ref 26.0–34.0)
MCHC: 33.5 g/dL (ref 30.0–36.0)
MCV: 86.9 fL (ref 80.0–100.0)
Monocytes Absolute: 0.4 10*3/uL (ref 0.1–1.0)
Monocytes Relative: 7 %
Neutro Abs: 4.3 10*3/uL (ref 1.7–7.7)
Neutrophils Relative %: 69 %
Platelets: 165 10*3/uL (ref 150–400)
RBC: 4.05 MIL/uL (ref 3.87–5.11)
RDW: 13.7 % (ref 11.5–15.5)
WBC: 6.3 10*3/uL (ref 4.0–10.5)
nRBC: 0 % (ref 0.0–0.2)

## 2022-04-29 LAB — CBC
HCT: 37.7 % (ref 36.0–46.0)
Hemoglobin: 12.4 g/dL (ref 12.0–15.0)
MCH: 29 pg (ref 26.0–34.0)
MCHC: 32.9 g/dL (ref 30.0–36.0)
MCV: 88.1 fL (ref 80.0–100.0)
Platelets: 158 10*3/uL (ref 150–400)
RBC: 4.28 MIL/uL (ref 3.87–5.11)
RDW: 13.7 % (ref 11.5–15.5)
WBC: 5.2 10*3/uL (ref 4.0–10.5)
nRBC: 0 % (ref 0.0–0.2)

## 2022-04-29 LAB — PROTIME-INR
INR: 1.1 (ref 0.8–1.2)
Prothrombin Time: 13.9 seconds (ref 11.4–15.2)

## 2022-04-29 LAB — BASIC METABOLIC PANEL
Anion gap: 6 (ref 5–15)
BUN: 24 mg/dL — ABNORMAL HIGH (ref 8–23)
CO2: 26 mmol/L (ref 22–32)
Calcium: 8.8 mg/dL — ABNORMAL LOW (ref 8.9–10.3)
Chloride: 107 mmol/L (ref 98–111)
Creatinine, Ser: 0.71 mg/dL (ref 0.44–1.00)
GFR, Estimated: >60 mL/min (ref >60)
Glucose, Bld: 134 mg/dL — ABNORMAL HIGH (ref 70–99)
Potassium: 3.3 mmol/L — ABNORMAL LOW (ref 3.5–5.1)
Sodium: 139 mmol/L (ref 135–145)

## 2022-04-29 LAB — MAGNESIUM: Magnesium: 1.9 mg/dL (ref 1.7–2.4)

## 2022-04-29 MED ORDER — WARFARIN SODIUM 5 MG PO TABS
5.0000 mg | ORAL_TABLET | Freq: Every day | ORAL | Status: DC
  Administered 2022-04-29 – 2022-04-30 (×2): 5 mg via ORAL
  Filled 2022-04-29 (×2): qty 1

## 2022-04-29 MED ORDER — ENOXAPARIN SODIUM 80 MG/0.8ML IJ SOSY
1.0000 mg/kg | PREFILLED_SYRINGE | Freq: Two times a day (BID) | INTRAMUSCULAR | Status: DC
  Administered 2022-04-29 – 2022-04-30 (×3): 62.5 mg via SUBCUTANEOUS
  Filled 2022-04-29 (×3): qty 0.8

## 2022-04-29 MED ORDER — WARFARIN SODIUM 2.5 MG PO TABS: 2.5000 mg | ORAL_TABLET | ORAL | Status: DC

## 2022-04-29 MED ORDER — WARFARIN - PHARMACIST DOSING INPATIENT: Freq: Every day | Status: DC

## 2022-04-29 MED ORDER — POTASSIUM CHLORIDE CRYS ER 20 MEQ PO TBCR
40.0000 meq | EXTENDED_RELEASE_TABLET | Freq: Once | ORAL | Status: AC
  Administered 2022-04-29: 40 meq via ORAL
  Filled 2022-04-29: qty 2

## 2022-04-29 NOTE — H&P (Incomplete)
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 Alysia Penna in the outpatient office.  Per chart review patient lives with spouse.  Two-level home bed and bath main level one-step to entry.  Uses a quad cane in the home, generally modified independent with mobility.  Presented 04/26/2022 with acute onset of slurred speech as well as altered mental status.  Family denied any recent trauma or fall.  Patient does have a history of chronic hip pain was scheduled to have injection 04/29/2022 thus her Coumadin had been held since 04/23/2022 and she had been placed on Lovenox bridge injections 1 mg/kilogram twice daily administered by her husband..  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  Chronic infarcts within the left thalamus and bilateral cerebellar hemispheres.  CT angiogram of head and neck no hemodynamically significant stenosis occlusion dissection or aneurysm.  Admission chemistries unremarkable aside glucose 155 BUN 26, hemoglobin A1c 6.1, ammonia level within normal limits, urinalysis negative nitrite, urine drug screen negative.  Hospital course discussed with neurosurgery/Dr. Annette Stable with conservative care and repeat cranial CT scan 04/27/2022 again showing moderate amount of subarachnoid hemorrhage in left parietal  cortex with slight interval increase.  Small amount of subarachnoid hemorrhage in the right parietal cortex not changed.  Plan on resuming Coumadin with Lovenox bridge 04/29/2022.  Patient tolerating a regular diet.  Therapy evaluations completed due to patient decreased functional mobility and slurred speech was admitted for a comprehensive rehab program.  Review of Systems  Constitutional:  Negative for chills and fever.  HENT:  Negative for hearing loss.   Eyes:  Negative for blurred vision and double vision.  Respiratory:  Negative for cough and shortness of breath.   Cardiovascular:  Positive for palpitations. Negative for chest pain and leg swelling.  Gastrointestinal:  Positive for constipation. Negative for heartburn, nausea and vomiting.       GERD  Genitourinary:  Negative for dysuria, flank pain and hematuria.  Musculoskeletal:  Positive for joint pain and myalgias.  Skin:  Negative for rash.  Neurological:  Positive for speech change, weakness and headaches.       History of left-sided weakness from previous CVA  All other systems reviewed and are negative.  Past Medical History:  Diagnosis Date   Abnormality of gait 09/07/2016   Allergy    Diabetes mellitus without complication [facility])    Patient denies this - notes history of glucose intolerance   GERD (gastroesophageal reflux disease)    Hemiparesis and alteration of sensations as late effects of stroke ([place]) 09/07/2016   History of pneumonia 1997   Hypertension    S/P MVR (mitral valve replacement)    Mechanical mitral valve replacement at age 11 (done in [place])  // echo 7/17: EF 55-60, normal wall motion, bileaflet mechanical mitral valve prosthesis functioning normally, mild LAE, mildly reduced RVSF, small pericardial effusion   Stroke ([place]) 1997, 2013, 2015   Past Surgical History:  Procedure  Laterality Date   ABDOMINAL HYSTERECTOMY     BRAIN SURGERY     BUNIONECTOMY  1993   CARDIAC VALVE REPLACEMENT  1997   TOE  SURGERY  1996   Family History  Problem Relation Age of Onset   Cancer Mother        Bone   Heart disease Mother    Hyperlipidemia Mother    Hypertension Mother    Stroke Father    Hypertension Father    Heart attack Neg Hx    Social History:  reports that she has never smoked. She has never been exposed to tobacco smoke. She has never used smokeless tobacco. She reports that she does not drink alcohol and does not use drugs. Allergies:  Allergies  Allergen Reactions   Zoloft [Sertraline] Hives, Itching and Rash   Medications Prior to Admission  Medication Sig Dispense Refill   acetaminophen (TYLENOL) 500 MG tablet Take 1,000 mg by mouth every 6 (six) hours as needed.     amLODipine (NORVASC) 5 MG tablet Take 1 tablet (5 mg total) by mouth at bedtime. 30 tablet 0   aspirin EC 81 MG tablet Take 81 mg by mouth daily.      atorvastatin (LIPITOR) 40 MG tablet TAKE 1 TABLET(40 MG) BY MOUTH DAILY 90 tablet 3   CALCIUM PO Take 1,200 mg by mouth daily.     Cholecalciferol (VITAMIN D3 PO) Take 1 tablet by mouth daily.     denosumab (PROLIA) 60 MG/ML SOSY injection Inject 60 mg into the skin every 6 (six) months.     docusate sodium (COLACE) 100 MG capsule Take 100 mg at bedtime by mouth.      enoxaparin (LOVENOX) 60 MG/0.6ML injection Inject 0.6 mLs (60 mg total) into the skin every 12 (twelve) hours. 12 mL 1   gabapentin (NEURONTIN) 100 MG capsule TAKE 1 CAPSULE(100 MG) BY MOUTH AT BEDTIME 30 capsule 1   Menthol, Topical Analgesic, (BENGAY EX) Apply 1 application topically daily. All painful areas     Mouthwashes (BIOTENE DRY MOUTH MT) Use as directed 1 spray in the mouth or throat as needed (dry mouth).     omeprazole (PRILOSEC) 20 MG capsule Take 20 mg by mouth daily.     Propylene Glycol-Glycerin (SOOTHE) 0.6-0.6 % SOLN Place 1 drop into both eyes 2 (two) times daily as needed (dry eyes).     warfarin (COUMADIN) 5 MG tablet Take Warfarin, '5mg'$  daily except 7.'5mg'$  on Mondays or as directed  by [facility] 45 tablet 3      Home: Home Living Family/patient expects to be discharged to:: Private residence Living Arrangements: Spouse/significant other Available Help at Discharge: Available 24 hours/day, Family Type of Home: House Home Access: Stairs to enter Technical brewer of Steps: 1 Home Layout: Two level, Able to live on main level with bedroom/bathroom Alternate Level Stairs-Number of Steps: flight Alternate Level Stairs-Rails: Right, Left Bathroom Shower/Tub: Chiropodist: Standard Bathroom Accessibility: Yes Home Equipment: Cane - quad Additional Comments: outpatient therapy   Functional History: Prior Function Prior Level of Function : Needs assist Physical Assist : Mobility (physical), ADLs (physical) Mobility Comments: used quad cane in the home, generally mod I with mobility.  Occasionally went out with husband to run errands.  Could push the cart for short distances. ADLs Comments: took sponge baths in lieu of getting into the tub.  Functional Status:  Mobility: Bed Mobility Overal bed mobility: Needs Assistance Bed Mobility: Supine to Sit Supine to sit: Min  guard Sit to supine: Mod assist General bed mobility comments: cues to sit EOB Transfers Overall transfer level: Needs assistance Equipment used: Rolling walker (2 wheels) Transfers: Sit to/from Stand, Bed to chair/wheelchair/BSC Sit to Stand: Min assist Bed to/from chair/wheelchair/BSC transfer type:: Step pivot Step pivot transfers: Min assist General transfer comment: increased cuing, pt perseverates during tasks Ambulation/Gait Ambulation/Gait assistance: Mod assist Gait Distance (Feet): 40 Feet Assistive device: Rolling walker (2 wheels) Gait Pattern/deviations: Step-to pattern General Gait Details: pt appeard initially to have motor planning and execution problems throughout the gait trial, worsening at about 40 feet with onset of ?BP issues vs a  neurological event.  Initially pt would haltingly start to move her L LE forward to command only to step it off to the L under or outside the RW OR pick of the L LE to put it on the RW.  Later, this became a consistent behavior, needing to have L LE block to only be able to step forward. Lastly the R LE start also no being able to step forward coordinatedly.  Pt then given a chair to sit in and over 20-30 secs became less responsive with large pupils.  RN witness.  Pt rolled back to chair then bed when sitting not appropriate. Gait velocity interpretation: <1.31 ft/sec, indicative of household ambulator    ADL: ADL Overall ADL's : Needs assistance/impaired Eating/Feeding: Minimal assistance, Sitting Grooming: Minimal assistance, Sitting Upper Body Bathing: Minimal assistance, Sitting Lower Body Bathing: Sitting/lateral leans, Moderate assistance Upper Body Dressing : Minimal assistance, Sitting Lower Body Dressing: Sitting/lateral leans, Moderate assistance Lower Body Dressing Details (indicate cue type and reason): able to don shoe with AFO brace also, assist for tying/fine motor aspect of task Toilet Transfer: Minimal assistance, Rolling walker (2 wheels), Stand-pivot Toilet Transfer Details (indicate cue type and reason): simulated to chair Toileting- Clothing Manipulation and Hygiene: Minimal assistance Functional mobility during ADLs: Minimal assistance, Rolling walker (2 wheels), Cueing for safety, Cueing for sequencing  Cognition: Cognition Overall Cognitive Status: Impaired/Different from baseline Arousal/Alertness: Awake/alert Orientation Level: Oriented to person, Oriented to place, Oriented to situation, Disoriented to time Year: 2023 Month: June Day of Week: Correct Attention: Focused, Sustained Focused Attention: Appears intact Sustained Attention: Impaired Sustained Attention Impairment: Verbal basic Awareness: Impaired Awareness Impairment: Emergent  impairment Behaviors: Perseveration Cognition Arousal/Alertness: Awake/alert Behavior During Therapy: WFL for tasks assessed/performed Overall Cognitive Status: Impaired/Different from baseline Area of Impairment: Attention, Orientation, Following commands, Awareness, Safety/judgement, Problem solving Orientation Level: Disoriented to, Situation Current Attention Level: Sustained Following Commands: Follows one step commands with increased time Safety/Judgement: Decreased awareness of safety, Decreased awareness of deficits Awareness: Emergent Problem Solving: Slow processing, Decreased initiation, Difficulty sequencing, Requires verbal cues, Requires tactile cues General Comments: A & O x4, perseverative  in conversation about the room temperature and needing long socks when donning shoes with AFO Difficult to assess due to: Impaired communication  Physical Exam: Blood pressure 109/65, pulse 75, temperature 97.9 F (36.6 C), resp. rate 16, height '5\' 7"'$  (1.702 m), weight 62.6 kg, SpO2 95 %. Physical Exam Neurological:     Comments: Patient is alert.  No acute distress.  Makes eye contact with examiner.  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  She was able to partake in simple conversation with some perseveration.  Patient was inconsistent to follow two-step commands.     Results for orders placed or performed during the hospital encounter of 04/26/22 (from the past 48 hour(s))  Hemoglobin and hematocrit, blood  Status: None   Collection Time: 04/28/22  3:02 PM  Result Value Ref Range   Hemoglobin 12.0 12.0 - 15.0 g/dL   HCT 37.4 36.0 - 46.0 %    Comment: Performed at [facility], [place] [address], [place], [place] [address]  Basic metabolic panel     Status: Abnormal   Collection Time: 04/29/22  1:32 AM  Result Value Ref Range   Sodium 139 135 - 145 mmol/L   Potassium 3.3 (L) 3.5 - 5.1 mmol/L   Chloride 107 98 - 111 mmol/L   CO2 26 22 - 32 mmol/L    Glucose, Bld 134 (H) 70 - 99 mg/dL    Comment: Glucose reference range applies only to samples taken after fasting for at least 8 hours.   BUN 24 (H) 8 - 23 mg/dL   Creatinine, Ser 0.71 0.44 - 1.00 mg/dL   Calcium 8.8 (L) 8.9 - 10.3 mg/dL   GFR, Estimated >60 >60 mL/min    Comment: (NOTE) Calculated using the CKD-EPI Creatinine Equation (2021)    Anion gap 6 5 - 15    Comment: Performed at [facility] [address], [place], [place] [address]  CBC with Differential/Platelet     Status: Abnormal   Collection Time: 04/29/22  1:32 AM  Result Value Ref Range   WBC 6.3 4.0 - 10.5 K/uL   RBC 4.05 3.87 - 5.11 MIL/uL   Hemoglobin 11.8 (L) 12.0 - 15.0 g/dL   HCT 35.2 (L) 36.0 - 46.0 %   MCV 86.9 80.0 - 100.0 fL   MCH 29.1 26.0 - 34.0 pg   MCHC 33.5 30.0 - 36.0 g/dL   RDW 13.7 11.5 - 15.5 %   Platelets 165 150 - 400 K/uL   nRBC 0.0 0.0 - 0.2 %   Neutrophils Relative % 69 %   Neutro Abs 4.3 1.7 - 7.7 K/uL   Lymphocytes Relative 23 %   Lymphs Abs 1.4 0.7 - 4.0 K/uL   Monocytes Relative 7 %   Monocytes Absolute 0.4 0.1 - 1.0 K/uL   Eosinophils Relative 1 %   Eosinophils Absolute 0.0 0.0 - 0.5 K/uL   Basophils Relative 0 %   Basophils Absolute 0.0 0.0 - 0.1 K/uL   Immature Granulocytes 0 %   Abs Immature Granulocytes 0.02 0.00 - 0.07 K/uL    Comment: Performed at [facility], [address] [place], [place] [address]  Magnesium     Status: None   Collection Time: 04/29/22  1:32 AM  Result Value Ref Range   Magnesium 1.9 1.7 - 2.4 mg/dL    Comment: Performed at [facility] [address], [place], [place] [address]   CT HEAD WO CONTRAST (5MM)  Result Date: 04/27/2022 CLINICAL DATA:  Subarachnoid hemorrhage EXAM: CT HEAD WITHOUT CONTRAST TECHNIQUE: Contiguous axial images were obtained from the base of the skull through the vertex without intravenous contrast. RADIATION DOSE REDUCTION: This exam was performed according to the departmental dose-optimization  program which includes automated exposure control, adjustment of the mA and/or kV according to patient size and/or use of iterative reconstruction technique. COMPARISON:  Previous studies including the examination done on 04/26/2022 FINDINGS: Brain: There is previous right parietal craniotomy. There is encephalomalacia in the right parietal lobe with no significant change. In image 23 of series 3, there is 9 mm high density structure in the center of area of encephalomalacia, possibly calcification with no significant change. There is moderate amount  of subarachnoid hemorrhage in the left parietal cortex with slight interval increase in size. There is small focus of subarachnoid hemorrhage in the right parietal cortex with no significant change. There are no epidural or subdural fluid collections. Small old lacunar infarcts are seen in the left thalamus and both cerebellar hemispheres with no significant interval change. Ventricles are not dilated. There is no evidence of blood within the ventricles. There is small lenticular shaped focal thickening in the posterior falx with no significant change. Cortical sulci are prominent suggesting atrophy. Vascular: Unremarkable. Skull: There is previous right parietal craniotomy. Sinuses/Orbits: Unremarkable. Other: None. IMPRESSION: There is moderate amount of subarachnoid hemorrhage in the left parietal cortex with slight interval increase. Small amount of subarachnoid hemorrhage in the right parietal cortex as not changed. There are no epidural or subdural fluid collections. There is no evidence of intraventricular blood. There is no significant focal mass effect. Other findings as described in the body of the report. Electronically Signed   By: Elmer Picker M.D.   On: 04/27/2022 08:23      Blood pressure 109/65, pulse 75, temperature 97.9 F (36.6 C), resp. rate 16, height '5\' 7"'$  (1.702 m), weight 62.6 kg, SpO2 95 %.  Medical Problem List and Plan: 1.  Functional deficits secondary to [facility] secondary to anticoagulation as well as history of CVA x2 with residual left-sided weakness/brain surgery with craniotomy  -patient may *** shower  -ELOS/Goals: *** 2.  Antithrombotics: -DVT/anticoagulation: Coumadin resumed 04/29/2022 with bridging of Lovenox.  INR goal 2.5-3.5  -antiplatelet therapy: N/A 3. Pain Management: Neurontin 100 mg nightly 4. Mood/Sleep: Provide emotional support  -antipsychotic agents: N/A 5. Neuropsych/cognition: This patient is capable of making decisions on her own behalf. 6. Skin/Wound Care: Routine skin checks 7. Fluids/Electrolytes/Nutrition: Routine in and outs with follow-up chemistries 8.  History of mitral valve replacement 1997.  Resuming Coumadin with bridging of Lovenox 04/29/2022.  Follow-up per cardiology services as needed 9.  Hyperlipidemia.  Lipitor 10.  Hypertension.  Norvasc 5 mg daily 11.  Prediabetes.  Hemoglobin A1c 6.1.  Diet controlled/SSI discontinued 12.  History of chronic hip pain.  Patient had been scheduled for injection 04/29/2022.  Procedure currently on hold 13.  GERD.  Protonix   Cathlyn Parsons, PA-C 04/29/2022

## 2022-04-29 NOTE — Progress Notes (Signed)
Inpatient Rehabilitation Admissions Coordinator   I await bed availability to admit to CIR.  Danne Baxter, RN, MSN Rehab Admissions Coordinator [phone] 04/29/2022 11:36 AM

## 2022-04-29 NOTE — Progress Notes (Signed)
Physical Therapy Treatment Patient Details Name: [name] MRN: [id number] DOB: 1946-09-17 Today's Date: 04/29/2022   History of Present Illness Pt is a 76 y/o F presenting to ED with confusion and slurred speech, head CT revealing acute SAH L > R parieto-occipital lobes overall in moderate volume. PMH includes MR s/p mechanical mitral valve replacement, CVA s/p hemiparesis, HTN, and HLD.    PT Comments    Pt tolerates treatment well, with improvement in ambulation distances. Pt demonstrates tendency for leftward drift due to L weakness, along with poor attention to left side, often bumping into objects on left. Pt will benefit from continued aggressive mobilization in an effort to improve balance and reduce falls risk. PT continues to strongly recommend AIR admission.   Recommendations for follow up therapy are one component of a multi-disciplinary discharge planning process, led by the attending physician.  Recommendations may be updated based on patient status, additional functional criteria and insurance authorization.  Follow Up Recommendations  Acute inpatient rehab (3hours/day)     Assistance Recommended at Discharge Frequent or constant Supervision/Assistance  Patient can return home with the following A little help with walking and/or transfers;A little help with bathing/dressing/bathroom;Assistance with cooking/housework;Assist for transportation;Help with stairs or ramp for entrance   Equipment Recommendations  Rolling walker (2 wheels)    Recommendations for Other Services       Precautions / Restrictions Precautions Precautions: Fall Restrictions Weight Bearing Restrictions: No     Mobility  Bed Mobility Overal bed mobility: Needs Assistance Bed Mobility: Supine to Sit     Supine to sit: Supervision, HOB elevated          Transfers Overall transfer level: Needs assistance Equipment used: Rolling walker (2 wheels) Transfers: Sit to/from Stand Sit to  Stand: Min assist                Ambulation/Gait Ambulation/Gait assistance: Min Web designer (Feet): 150 Feet Assistive device: Rolling walker (2 wheels) Gait Pattern/deviations: Step-through pattern, Decreased dorsiflexion - left Gait velocity: reduced Gait velocity interpretation: <1.31 ft/sec, indicative of household ambulator   General Gait Details: pt with slowed step-through gait, reduced step length due to impaired foot clearance. walker drifting to left side due to LUE weakness, pt with poor awareness/attention to left side   Stairs             Wheelchair Mobility    Modified Rankin (Stroke Patients Only) Modified Rankin (Stroke Patients Only) Pre-Morbid Rankin Score: Moderate disability Modified Rankin: Moderately severe disability     Balance Overall balance assessment: Needs assistance Sitting-balance support: No upper extremity supported, Feet supported Sitting balance-Leahy Scale: Good     Standing balance support: Bilateral upper extremity supported, Reliant on assistive device for balance Standing balance-Leahy Scale: Poor                              Cognition Arousal/Alertness: Awake/alert Behavior During Therapy: WFL for tasks assessed/performed Overall Cognitive Status: Impaired/Different from baseline Area of Impairment: Safety/judgement, Awareness                         Safety/Judgement: Decreased awareness of safety, Decreased awareness of deficits Awareness: Emergent            Exercises      General Comments General comments (skin integrity, edema, etc.): VSS on RA, no reports of orthostatic symptoms      Pertinent Vitals/Pain Pain  Assessment Pain Assessment: No/denies pain    Home Living                          Prior Function            PT Goals (current goals can now be found in the care plan section) Acute Rehab PT Goals Patient Stated Goal: to return to prior level  of function Progress towards PT goals: Progressing toward goals    Frequency    Min 3X/week      PT Plan Current plan remains appropriate    Co-evaluation              AM-PAC PT "6 Clicks" Mobility   Outcome Measure  Help needed turning from your back to your side while in a flat bed without using bedrails?: A Little Help needed moving from lying on your back to sitting on the side of a flat bed without using bedrails?: A Little Help needed moving to and from a bed to a chair (including a wheelchair)?: A Little Help needed standing up from a chair using your arms (e.g., wheelchair or bedside chair)?: A Little Help needed to walk in hospital room?: A Little Help needed climbing 3-5 steps with a railing? : Total 6 Click Score: 16    End of Session   Activity Tolerance: Patient tolerated treatment well Patient left: in chair;with call bell/phone within reach;with chair alarm set Nurse Communication: Mobility status PT Visit Diagnosis: Other abnormalities of gait and mobility (R26.89);Other symptoms and signs involving the nervous system (R29.898)     Time: 1700-1736 PT Time Calculation (min) (ACUTE ONLY): 36 min  Charges:  $Gait Training: 8-22 mins $Therapeutic Activity: 8-22 mins                     Zenaida Niece, PT, DPT Acute Rehabilitation Office [place] 04/29/2022, 5:49 PM

## 2022-04-29 NOTE — Progress Notes (Signed)
Progress Note    [name]   ZDG:[id number]  DOB: 1946/08/15  DOA: 04/26/2022     3 PCP: Haywood Pao, MD  Initial CC: speech difficulty  Hospital Course: [name] is a 76 y.o. 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  She has chronic R hip pain and was to have an injection on 6/22. Her coumadin was held and she was placed on a lovenox bridge until procedure which was ongoing on admission.   CT head showed an acute subarachnoid hemorrhage overlying left greater than right parieto-occipital lobes. Anticoagulation was held but no reversal agents were used on admission.  She was admitted for ongoing observation and repeat CT head along with neurosurgery evaluation.  Interval History:  No events overnight.  She did have some dizziness and hypotension yesterday when walking with physical therapy.  Blood pressure responded to fluid bolus.  She underwent orthostatic blood pressure check today which was negative.  She did endorse feeling some dizziness after already being up for several minutes and walking.  There was no associated blood pressure drop with this however.  Assessment and Plan: * SAH (subarachnoid hemorrhage) ([place]) - No trauma prior to admission.  Etiology suspected due to anticoagulation use - Patient has been on Lovenox bridge for outpatient procedure with Ortho planned for 04/29/2022.  Coumadin has been on hold.  INR 1.7 on admission -Repeat CT head performed on 04/27/2022 shows ongoing [place] with slight interval increase involving left parietal cortex; right parietal cortex noted to be stable and unchanged -Discussed with neurosurgery.  Plan is for holding anticoagulation for 72 hours then resuming 6/22 - neurologically has improved since admission (improved dysarthria/aphasia) - maintain BP control  - patient notes some dizziness after walking some; negative orthostatic BP check on 6/22.  Symptoms likely related to [facility] and should improve slowly with time   H/O mitral valve replacement with mechanical valve - Per husband, valve replaced December 1997 - Compliant with Coumadin - okay to resume Coumadin with Lovenox bridge today, 6/22  HTN (hypertension) - Hold Norvasc to allow some further improvement in BP, plus became hypotensive on 6/21 requiring fluid bolus   Prediabetes - A1c 6.1% on 04/26/2022 - Continue diet control  HLD (hyperlipidemia) - Continue Lipitor    Old records reviewed in assessment of this patient  Antimicrobials:   DVT prophylaxis:  SCD's Start: 04/26/22 1534 warfarin (COUMADIN) tablet 5 mg  warfarin (COUMADIN) tablet 2.5 mg   Code Status:   Code Status: Full Code  Disposition Plan: Possible admission to CIR on Friday Status is: Inpatient  Objective: Blood pressure (!) 104/59, pulse 83, temperature 98.1 F (36.7 C), temperature source Oral, resp. rate 16, height '5\' 7"'$  (1.702 m), weight 62.6 kg, SpO2 96 %.  Examination:  Physical Exam Constitutional:      General: She is not in acute distress.    Appearance: Normal appearance.  HENT:     Head: Normocephalic and atraumatic.     Mouth/Throat:     Mouth: Mucous membranes are moist.  Eyes:     Extraocular Movements: Extraocular movements intact.     Pupils: Pupils are equal, round, and reactive to light.  Cardiovascular:     Rate and Rhythm: Normal rate and regular rhythm.  Pulmonary:     Effort: Pulmonary effort is normal.     Breath sounds: Normal breath sounds.  Abdominal:     General: Bowel sounds are  normal. There is no distension.     Palpations: Abdomen is soft.     Tenderness: There is no abdominal tenderness.  Musculoskeletal:        General: Normal range of motion.     Cervical back: Normal range of motion and neck supple.  Skin:    General: Skin is warm and dry.  Neurological:     Mental Status: She is alert and oriented to person, place, and time.  Psychiatric:         Mood and Affect: Mood normal.      Consultants:  Neurosurgery  Procedures:    Data Reviewed: Results for orders placed or performed during the hospital encounter of 04/26/22 (from the past 24 hour(s))  Hemoglobin and hematocrit, blood     Status: None   Collection Time: 04/28/22  3:02 PM  Result Value Ref Range   Hemoglobin 12.0 12.0 - 15.0 g/dL   HCT 37.4 36.0 - 97.0 %  Basic metabolic panel     Status: Abnormal   Collection Time: 04/29/22  1:32 AM  Result Value Ref Range   Sodium 139 135 - 145 mmol/L   Potassium 3.3 (L) 3.5 - 5.1 mmol/L   Chloride 107 98 - 111 mmol/L   CO2 26 22 - 32 mmol/L   Glucose, Bld 134 (H) 70 - 99 mg/dL   BUN 24 (H) 8 - 23 mg/dL   Creatinine, Ser 0.71 0.44 - 1.00 mg/dL   Calcium 8.8 (L) 8.9 - 10.3 mg/dL   GFR, Estimated >60 >60 mL/min   Anion gap 6 5 - 15  CBC with Differential/Platelet     Status: Abnormal   Collection Time: 04/29/22  1:32 AM  Result Value Ref Range   WBC 6.3 4.0 - 10.5 K/uL   RBC 4.05 3.87 - 5.11 MIL/uL   Hemoglobin 11.8 (L) 12.0 - 15.0 g/dL   HCT 35.2 (L) 36.0 - 46.0 %   MCV 86.9 80.0 - 100.0 fL   MCH 29.1 26.0 - 34.0 pg   MCHC 33.5 30.0 - 36.0 g/dL   RDW 13.7 11.5 - 15.5 %   Platelets 165 150 - 400 K/uL   nRBC 0.0 0.0 - 0.2 %   Neutrophils Relative % 69 %   Neutro Abs 4.3 1.7 - 7.7 K/uL   Lymphocytes Relative 23 %   Lymphs Abs 1.4 0.7 - 4.0 K/uL   Monocytes Relative 7 %   Monocytes Absolute 0.4 0.1 - 1.0 K/uL   Eosinophils Relative 1 %   Eosinophils Absolute 0.0 0.0 - 0.5 K/uL   Basophils Relative 0 %   Basophils Absolute 0.0 0.0 - 0.1 K/uL   Immature Granulocytes 0 %   Abs Immature Granulocytes 0.02 0.00 - 0.07 K/uL  Magnesium     Status: None   Collection Time: 04/29/22  1:32 AM  Result Value Ref Range   Magnesium 1.9 1.7 - 2.4 mg/dL  CBC     Status: None   Collection Time: 04/29/22  8:38 AM  Result Value Ref Range   WBC 5.2 4.0 - 10.5 K/uL   RBC 4.28 3.87 - 5.11 MIL/uL   Hemoglobin 12.4 12.0 - 15.0  g/dL   HCT 37.7 36.0 - 46.0 %   MCV 88.1 80.0 - 100.0 fL   MCH 29.0 26.0 - 34.0 pg   MCHC 32.9 30.0 - 36.0 g/dL   RDW 13.7 11.5 - 15.5 %   Platelets 158 150 - 400 K/uL   nRBC 0.0 0.0 -  0.2 %  Protime-INR     Status: None   Collection Time: 04/29/22  8:38 AM  Result Value Ref Range   Prothrombin Time 13.9 11.4 - 15.2 seconds   INR 1.1 0.8 - 1.2    I have Reviewed nursing notes, Vitals, and Lab results since pt's last encounter. Pertinent lab results : see above I have ordered test including BMP, CBC, Mg I have reviewed the last note from staff over past 24 hours I have discussed pt's care plan and test results with nursing staff, case manager   LOS: 3 days   Dwyane Dee, MD [facility] 04/29/2022, 2:21 PM

## 2022-04-29 NOTE — Plan of Care (Signed)
  Problem: Education: Goal: Knowledge of General Education information will improve Description Including pain rating scale, medication(s)/side effects and non-pharmacologic comfort measures Outcome: Progressing   

## 2022-04-29 NOTE — Progress Notes (Signed)
   04/29/22 1049 04/29/22 1053 04/29/22 1057  Vitals  BP 114/64 118/79 104/76  MAP (mmHg) 79 91 85  BP Location Right Leg Right Arm Right Arm  BP Method Automatic Automatic Automatic  Patient Position (if appropriate) Lying Sitting Standing  Pulse Rate 83 95 (!) 120  Pulse Rate Source Dinamap Dinamap Dinamap  ECG Heart Rate 95 (!) 112 (!) 125    Orthostatic vitals per order.

## 2022-04-29 NOTE — Progress Notes (Signed)
ANTICOAGULATION CONSULT NOTE - Initial Consult  Pharmacy Consult for warfarin w/ lovenox to bridge Indication:  mechanical mitral valve  Allergies  Allergen Reactions   Zoloft [Sertraline] Hives, Itching and Rash    Patient Measurements: Height: '5\' 7"'$  (170.2 cm) Weight: 62.6 kg (138 lb 0.1 oz) IBW/kg (Calculated) : 61.6  Vital Signs: Temp: 98.2 F (36.8 C) (06/22 0729) Temp Source: Oral (06/22 0729) BP: 118/73 (06/22 0729) Pulse Rate: 87 (06/22 0729)  Labs: Recent Labs    04/26/22 0946 04/26/22 1010 04/26/22 1212 04/27/22 0038 04/28/22 1502 04/29/22 0132 04/29/22 0838  HGB 13.2   < >  --  12.2 12.0 11.8* 12.4  HCT 41.6   < >  --  38.5 37.4 35.2* 37.7  PLT 181  --   --  173  --  165 158  APTT  --   --  36  --   --   --   --   LABPROT 19.7*  --   --  18.0*  --   --  13.9  INR 1.7*  --   --  1.5*  --   --  1.1  CREATININE 0.77  --   --   --   --  0.71  --    < > = values in this interval not displayed.    Estimated Creatinine Clearance: 59.1 mL/min (by C-G formula based on SCr of 0.71 mg/dL).   Medical History: Past Medical History:  Diagnosis Date   Abnormality of gait 09/07/2016   Allergy    Diabetes mellitus without complication [facility])    Patient denies this - notes history of glucose intolerance   GERD (gastroesophageal reflux disease)    Hemiparesis and alteration of sensations as late effects of stroke ([place]) 09/07/2016   History of pneumonia 1997   Hypertension    S/P MVR (mitral valve replacement)    Mechanical mitral valve replacement at age 76 (done in [place])  // echo 7/17: EF 55-60, normal wall motion, bileaflet mechanical mitral valve prosthesis functioning normally, mild LAE, mildly reduced RVSF, small pericardial effusion   Stroke ([place]) 1997, 2013, 2015    Medications:  Medications Prior to Admission  Medication Sig Dispense Refill Last Dose   acetaminophen (TYLENOL) 500 MG tablet Take 1,000 mg by mouth every 6 (six) hours as needed.    unknown   amLODipine (NORVASC) 5 MG tablet Take 1 tablet (5 mg total) by mouth at bedtime. 30 tablet 0 unknown   aspirin EC 81 MG tablet Take 81 mg by mouth daily.    unknown   atorvastatin (LIPITOR) 40 MG tablet TAKE 1 TABLET(40 MG) BY MOUTH DAILY 90 tablet 3 unknown   CALCIUM PO Take 1,200 mg by mouth daily.   unknown   Cholecalciferol (VITAMIN D3 PO) Take 1 tablet by mouth daily.   unknown   denosumab (PROLIA) 60 MG/ML SOSY injection Inject 60 mg into the skin every 6 (six) months.   unknown   docusate sodium (COLACE) 100 MG capsule Take 100 mg at bedtime by mouth.    unknown   enoxaparin (LOVENOX) 60 MG/0.6ML injection Inject 0.6 mLs (60 mg total) into the skin every 12 (twelve) hours. 12 mL 1 04/25/2022 at 8:00pm   gabapentin (NEURONTIN) 100 MG capsule TAKE 1 CAPSULE(100 MG) BY MOUTH AT BEDTIME 30 capsule 1 unknown   Menthol, Topical Analgesic, (BENGAY EX) Apply 1 application topically daily. All painful areas   unknown   Mouthwashes (BIOTENE DRY MOUTH MT) Use as directed  1 spray in the mouth or throat as needed (dry mouth).   unknown   omeprazole (PRILOSEC) 20 MG capsule Take 20 mg by mouth daily.   unknown   Propylene Glycol-Glycerin (SOOTHE) 0.6-0.6 % SOLN Place 1 drop into both eyes 2 (two) times daily as needed (dry eyes).   unknown   warfarin (COUMADIN) 5 MG tablet Take Warfarin, '5mg'$  daily except 7.'5mg'$  on Mondays or as directed by [facility] 45 tablet 3 04/23/2022    Assessment: 76 YOF presents to the ED with confusion and slurred speech. 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 She has a PMH positive for mMVR in 1997. Lovenox / warfarin from PTA was held.  Consult to restart lovenox / warfarin.  Goal of Therapy:  INR 2.5-3.5 Monitor platelets by anticoagulation protocol: Yes   Plan:  Start lovenox 62.5 mg [place] q12h and warfarin as PTA doses (5 mg daily. Give additional 2.5 mg on Mondays (7.5 mg dose) INR daily CBC daily May DC  lovenox when INR is >/=2.5 X2 measurements in a row Monitor for signs of bleeding  Thank you for including Pharmacy in the care of this patient.  Vaughan Basta BS, PharmD, BCPS Clinical Pharmacist 04/29/2022 9:40 AM  Contact: [phone] after 3 PM  "Be curious, not judgmental..." -Jamal Maes

## 2022-04-30 ENCOUNTER — Other Ambulatory Visit: Payer: Self-pay

## 2022-04-30 ENCOUNTER — Encounter (HOSPITAL_COMMUNITY): Payer: Self-pay | Admitting: Physical Medicine & Rehabilitation

## 2022-04-30 ENCOUNTER — Inpatient Hospital Stay (HOSPITAL_COMMUNITY)
Admission: RE | Admit: 2022-04-30 | Discharge: 2022-06-01 | DRG: 057 | Disposition: A | Discharge status: Skilled Nursing Facility | Payer: Medicare Other | Source: Intra-hospital | Attending: Physical Medicine & Rehabilitation | Admitting: Physical Medicine & Rehabilitation

## 2022-04-30 DIAGNOSIS — R52 Pain, unspecified: Secondary | ICD-10-CM

## 2022-04-30 DIAGNOSIS — R42 Dizziness and giddiness: Secondary | ICD-10-CM | POA: Diagnosis not present

## 2022-04-30 DIAGNOSIS — G4700 Insomnia, unspecified: Secondary | ICD-10-CM | POA: Diagnosis present

## 2022-04-30 DIAGNOSIS — F32A Depression, unspecified: Secondary | ICD-10-CM | POA: Diagnosis present

## 2022-04-30 DIAGNOSIS — Z79899 Other long term (current) drug therapy: Secondary | ICD-10-CM | POA: Diagnosis not present

## 2022-04-30 DIAGNOSIS — Z8249 Family history of ischemic heart disease and other diseases of the circulatory system: Secondary | ICD-10-CM

## 2022-04-30 DIAGNOSIS — E785 Hyperlipidemia, unspecified: Secondary | ICD-10-CM | POA: Diagnosis present

## 2022-04-30 DIAGNOSIS — Z515 Encounter for palliative care: Secondary | ICD-10-CM | POA: Diagnosis not present

## 2022-04-30 DIAGNOSIS — M48062 Spinal stenosis, lumbar region with neurogenic claudication: Secondary | ICD-10-CM | POA: Diagnosis not present

## 2022-04-30 DIAGNOSIS — Z741 Need for assistance with personal care: Secondary | ICD-10-CM | POA: Diagnosis present

## 2022-04-30 DIAGNOSIS — I69098 Other sequelae following nontraumatic subarachnoid hemorrhage: Secondary | ICD-10-CM

## 2022-04-30 DIAGNOSIS — I10 Essential (primary) hypertension: Secondary | ICD-10-CM | POA: Diagnosis present

## 2022-04-30 DIAGNOSIS — I639 Cerebral infarction, unspecified: Secondary | ICD-10-CM | POA: Diagnosis not present

## 2022-04-30 DIAGNOSIS — I609 Nontraumatic subarachnoid hemorrhage, unspecified: Secondary | ICD-10-CM

## 2022-04-30 DIAGNOSIS — R7303 Prediabetes: Secondary | ICD-10-CM | POA: Diagnosis not present

## 2022-04-30 DIAGNOSIS — G8929 Other chronic pain: Secondary | ICD-10-CM | POA: Diagnosis present

## 2022-04-30 DIAGNOSIS — Z7901 Long term (current) use of anticoagulants: Secondary | ICD-10-CM

## 2022-04-30 DIAGNOSIS — Z888 Allergy status to other drugs, medicaments and biological substances status: Secondary | ICD-10-CM

## 2022-04-30 DIAGNOSIS — D649 Anemia, unspecified: Secondary | ICD-10-CM | POA: Diagnosis not present

## 2022-04-30 DIAGNOSIS — Z83438 Family history of other disorder of lipoprotein metabolism and other lipidemia: Secondary | ICD-10-CM

## 2022-04-30 DIAGNOSIS — R4701 Aphasia: Secondary | ICD-10-CM | POA: Diagnosis not present

## 2022-04-30 DIAGNOSIS — I69152 Hemiplegia and hemiparesis following nontraumatic intracerebral hemorrhage affecting left dominant side: Secondary | ICD-10-CM | POA: Diagnosis not present

## 2022-04-30 DIAGNOSIS — I69020 Aphasia following nontraumatic subarachnoid hemorrhage: Secondary | ICD-10-CM

## 2022-04-30 DIAGNOSIS — E876 Hypokalemia: Secondary | ICD-10-CM | POA: Diagnosis not present

## 2022-04-30 DIAGNOSIS — Z20822 Contact with and (suspected) exposure to covid-19: Secondary | ICD-10-CM | POA: Diagnosis present

## 2022-04-30 DIAGNOSIS — K219 Gastro-esophageal reflux disease without esophagitis: Secondary | ICD-10-CM | POA: Diagnosis present

## 2022-04-30 DIAGNOSIS — R2681 Unsteadiness on feet: Secondary | ICD-10-CM | POA: Diagnosis not present

## 2022-04-30 DIAGNOSIS — K5901 Slow transit constipation: Secondary | ICD-10-CM | POA: Diagnosis not present

## 2022-04-30 DIAGNOSIS — M79604 Pain in right leg: Secondary | ICD-10-CM | POA: Diagnosis not present

## 2022-04-30 DIAGNOSIS — Z823 Family history of stroke: Secondary | ICD-10-CM

## 2022-04-30 DIAGNOSIS — M48061 Spinal stenosis, lumbar region without neurogenic claudication: Secondary | ICD-10-CM | POA: Diagnosis not present

## 2022-04-30 DIAGNOSIS — E569 Vitamin deficiency, unspecified: Secondary | ICD-10-CM | POA: Diagnosis not present

## 2022-04-30 DIAGNOSIS — Z7401 Bed confinement status: Secondary | ICD-10-CM | POA: Diagnosis not present

## 2022-04-30 DIAGNOSIS — M25559 Pain in unspecified hip: Secondary | ICD-10-CM | POA: Diagnosis present

## 2022-04-30 DIAGNOSIS — K5900 Constipation, unspecified: Secondary | ICD-10-CM | POA: Diagnosis not present

## 2022-04-30 DIAGNOSIS — R791 Abnormal coagulation profile: Secondary | ICD-10-CM | POA: Diagnosis not present

## 2022-04-30 DIAGNOSIS — D696 Thrombocytopenia, unspecified: Secondary | ICD-10-CM | POA: Diagnosis not present

## 2022-04-30 DIAGNOSIS — R2689 Other abnormalities of gait and mobility: Secondary | ICD-10-CM | POA: Diagnosis not present

## 2022-04-30 DIAGNOSIS — R531 Weakness: Secondary | ICD-10-CM | POA: Diagnosis not present

## 2022-04-30 DIAGNOSIS — G459 Transient cerebral ischemic attack, unspecified: Secondary | ICD-10-CM | POA: Diagnosis not present

## 2022-04-30 DIAGNOSIS — I69354 Hemiplegia and hemiparesis following cerebral infarction affecting left non-dominant side: Secondary | ICD-10-CM

## 2022-04-30 DIAGNOSIS — R262 Difficulty in walking, not elsewhere classified: Secondary | ICD-10-CM | POA: Diagnosis not present

## 2022-04-30 DIAGNOSIS — E119 Type 2 diabetes mellitus without complications: Secondary | ICD-10-CM | POA: Diagnosis present

## 2022-04-30 DIAGNOSIS — I69023 Fluency disorder following nontraumatic subarachnoid hemorrhage: Secondary | ICD-10-CM | POA: Diagnosis not present

## 2022-04-30 DIAGNOSIS — M6281 Muscle weakness (generalized): Secondary | ICD-10-CM | POA: Diagnosis not present

## 2022-04-30 DIAGNOSIS — Z952 Presence of prosthetic heart valve: Secondary | ICD-10-CM

## 2022-04-30 DIAGNOSIS — M545 Low back pain, unspecified: Secondary | ICD-10-CM | POA: Diagnosis not present

## 2022-04-30 DIAGNOSIS — M4126 Other idiopathic scoliosis, lumbar region: Secondary | ICD-10-CM | POA: Diagnosis not present

## 2022-04-30 DIAGNOSIS — Z7982 Long term (current) use of aspirin: Secondary | ICD-10-CM

## 2022-04-30 DIAGNOSIS — I69320 Aphasia following cerebral infarction: Secondary | ICD-10-CM | POA: Diagnosis not present

## 2022-04-30 LAB — CBC WITH DIFFERENTIAL/PLATELET
Abs Immature Granulocytes: 0.02 10*3/uL (ref 0.00–0.07)
Basophils Absolute: 0 10*3/uL (ref 0.0–0.1)
Basophils Relative: 1 %
Eosinophils Absolute: 0.1 10*3/uL (ref 0.0–0.5)
Eosinophils Relative: 1 %
HCT: 37.8 % (ref 36.0–46.0)
Hemoglobin: 11.9 g/dL — ABNORMAL LOW (ref 12.0–15.0)
Immature Granulocytes: 0 %
Lymphocytes Relative: 23 %
Lymphs Abs: 1.4 10*3/uL (ref 0.7–4.0)
MCH: 27.5 pg (ref 26.0–34.0)
MCHC: 31.5 g/dL (ref 30.0–36.0)
MCV: 87.5 fL (ref 80.0–100.0)
Monocytes Absolute: 0.4 10*3/uL (ref 0.1–1.0)
Monocytes Relative: 6 %
Neutro Abs: 4.1 10*3/uL (ref 1.7–7.7)
Neutrophils Relative %: 69 %
Platelets: 171 10*3/uL (ref 150–400)
RBC: 4.32 MIL/uL (ref 3.87–5.11)
RDW: 13.6 % (ref 11.5–15.5)
WBC: 6 10*3/uL (ref 4.0–10.5)
nRBC: 0 % (ref 0.0–0.2)

## 2022-04-30 LAB — PROTIME-INR
INR: 1.2 (ref 0.8–1.2)
Prothrombin Time: 15.3 seconds — ABNORMAL HIGH (ref 11.4–15.2)

## 2022-04-30 LAB — BASIC METABOLIC PANEL
Anion gap: 6 (ref 5–15)
BUN: 20 mg/dL (ref 8–23)
CO2: 26 mmol/L (ref 22–32)
Calcium: 9.2 mg/dL (ref 8.9–10.3)
Chloride: 109 mmol/L (ref 98–111)
Creatinine, Ser: 0.75 mg/dL (ref 0.44–1.00)
GFR, Estimated: >60 mL/min (ref >60)
Glucose, Bld: 135 mg/dL — ABNORMAL HIGH (ref 70–99)
Potassium: 3.7 mmol/L (ref 3.5–5.1)
Sodium: 141 mmol/L (ref 135–145)

## 2022-04-30 LAB — MAGNESIUM: Magnesium: 1.7 mg/dL (ref 1.7–2.4)

## 2022-04-30 MED ORDER — ENOXAPARIN SODIUM 60 MG/0.6ML IJ SOSY
1.0000 mg/kg | PREFILLED_SYRINGE | Freq: Two times a day (BID) | INTRAMUSCULAR | Status: DC
  Administered 2022-04-30 – 2022-05-08 (×16): 60 mg via SUBCUTANEOUS
  Filled 2022-04-30 (×15): qty 0.6

## 2022-04-30 MED ORDER — PANTOPRAZOLE SODIUM 40 MG PO TBEC
40.0000 mg | DELAYED_RELEASE_TABLET | Freq: Every day | ORAL | Status: DC
  Administered 2022-05-01 – 2022-06-01 (×32): 40 mg via ORAL
  Filled 2022-04-30 (×32): qty 1

## 2022-04-30 MED ORDER — WARFARIN SODIUM 5 MG PO TABS
5.0000 mg | ORAL_TABLET | Freq: Every day | ORAL | Status: DC
  Administered 2022-05-01 – 2022-05-07 (×7): 5 mg via ORAL
  Filled 2022-04-30 (×8): qty 1

## 2022-04-30 MED ORDER — ENOXAPARIN SODIUM 80 MG/0.8ML IJ SOSY
1.0000 mg/kg | PREFILLED_SYRINGE | Freq: Two times a day (BID) | INTRAMUSCULAR | Status: DC
  Filled 2022-04-30 (×2): qty 0.63

## 2022-04-30 MED ORDER — WARFARIN - PHARMACIST DOSING INPATIENT: Freq: Every day | Status: DC

## 2022-04-30 MED ORDER — WARFARIN SODIUM 2.5 MG PO TABS
2.5000 mg | ORAL_TABLET | ORAL | Status: DC
Start: 2022-05-03 — End: 2022-05-08
  Administered 2022-05-03: 2.5 mg via ORAL
  Filled 2022-04-30: qty 1

## 2022-04-30 MED ORDER — ATORVASTATIN CALCIUM 40 MG PO TABS
40.0000 mg | ORAL_TABLET | Freq: Every day | ORAL | Status: DC
  Administered 2022-05-01 – 2022-06-01 (×32): 40 mg via ORAL
  Filled 2022-04-30 (×32): qty 1

## 2022-04-30 MED ORDER — POLYVINYL ALCOHOL 1.4 % OP SOLN: 1.0000 [drp] | Freq: Two times a day (BID) | OPHTHALMIC | Status: DC | PRN

## 2022-04-30 MED ORDER — ACETAMINOPHEN 325 MG PO TABS
325.0000 mg | ORAL_TABLET | ORAL | Status: DC | PRN
  Administered 2022-05-01 – 2022-05-31 (×24): 650 mg via ORAL
  Filled 2022-04-30 (×25): qty 2

## 2022-04-30 MED ORDER — DOCUSATE SODIUM 100 MG PO CAPS
100.0000 mg | ORAL_CAPSULE | Freq: Every day | ORAL | Status: DC
  Administered 2022-05-01 – 2022-05-10 (×10): 100 mg via ORAL
  Filled 2022-04-30 (×10): qty 1

## 2022-04-30 MED ORDER — SENNOSIDES-DOCUSATE SODIUM 8.6-50 MG PO TABS
1.0000 | ORAL_TABLET | Freq: Every evening | ORAL | Status: DC | PRN
  Administered 2022-05-08 – 2022-05-09 (×2): 1 via ORAL
  Filled 2022-04-30 (×2): qty 1

## 2022-04-30 MED ORDER — GABAPENTIN 100 MG PO CAPS
100.0000 mg | ORAL_CAPSULE | Freq: Every day | ORAL | Status: DC
  Administered 2022-05-01 – 2022-05-10 (×10): 100 mg via ORAL
  Filled 2022-04-30 (×10): qty 1

## 2022-04-30 NOTE — Discharge Summary (Signed)
Physician Discharge Summary   [name] XBM:[id number] DOB: 12-13-1945 DOA: 04/26/2022  PCP: Gaspar Garbe, MD  Admit date: 04/26/2022 Discharge date: 04/30/2022  Admitted From: Home Disposition:  CIR Discharging physician: Lewie Chamber, MD  Recommendations for Outpatient Follow-up:  Continue routine chronic management  Home Health:  Equipment/Devices:   Discharge Condition: stable CODE STATUS: Full Diet recommendation:  Diet Orders (From admission, onward)     Start     Ordered   04/27/22 1308  Diet Heart Room service appropriate? Yes; Fluid consistency: Thin  Diet effective now       Question Answer Comment  Room service appropriate? Yes   Fluid consistency: Thin      04/27/22 1307            Hospital Course: [name] is a 76 y.o. 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  She has chronic R hip pain and was to have an injection on 6/22. Her coumadin was held and she was placed on a lovenox bridge until procedure which was ongoing on admission.   CT head showed an acute subarachnoid hemorrhage overlying left greater than right parieto-occipital lobes. Anticoagulation was held but no reversal agents were used on admission.  She was admitted for ongoing observation and repeat CT head along with neurosurgery evaluation.  Assessment and Plan: * SAH (subarachnoid hemorrhage) (HCC) - No trauma prior to admission.  Etiology suspected due to anticoagulation use - Patient has been on Lovenox bridge for outpatient procedure with Ortho planned for 04/29/2022.  Coumadin has been on hold.  INR 1.7 on admission -Repeat CT head performed on 04/27/2022 shows ongoing SAH with slight interval increase involving left parietal cortex; right parietal cortex noted to be stable and unchanged -Discussed with neurosurgery.  Plan is for holding anticoagulation for 72 hours then resuming 6/22 -  neurologically has improved since admission (improved dysarthria/aphasia) - maintain BP control  - patient notes some dizziness after walking some; negative orthostatic BP check on 6/22. Symptoms likely related to [facility] and should improve slowly with time   H/O mitral valve replacement with mechanical valve - Per husband, valve replaced December 1997 - Compliant with Coumadin - okay to resume Coumadin with Lovenox bridge, 6/22  HTN (hypertension) - Hold Norvasc to allow some further improvement in BP, plus became hypotensive on 6/21 requiring fluid bolus   Prediabetes - A1c 6.1% on 04/26/2022 - Continue diet control  HLD (hyperlipidemia) - Continue Lipitor       Principal Diagnosis: SAH (subarachnoid hemorrhage) (HCC)  Discharge Diagnoses: Principal Problem:   SAH (subarachnoid hemorrhage) (HCC) Active Problems:   H/O mitral valve replacement with mechanical valve   HTN (hypertension)   HLD (hyperlipidemia)   Anticoagulated on warfarin   Prediabetes    Allergies as of 04/30/2022       Reactions   Zoloft [sertraline] Hives, Itching, Rash        Medication List     ASK your doctor about these medications    acetaminophen 500 MG tablet Commonly known as: TYLENOL Take 1,000 mg by mouth every 6 (six) hours as needed.   amLODipine 5 MG tablet Commonly known as: NORVASC Take 1 tablet (5 mg total) by mouth at bedtime.   aspirin EC 81 MG tablet Take 81 mg by mouth daily.   atorvastatin 40 MG tablet Commonly known as: LIPITOR TAKE 1 TABLET(40 MG) BY MOUTH DAILY   BENGAY EX Apply  1 application topically daily. All painful areas   BIOTENE DRY MOUTH MT Use as directed 1 spray in the mouth or throat as needed (dry mouth).   CALCIUM PO Take 1,200 mg by mouth daily.   docusate sodium 100 MG capsule Commonly known as: COLACE Take 100 mg at bedtime by mouth.   enoxaparin 60 MG/0.6ML injection Commonly known as: LOVENOX Inject 0.6 mLs (60 mg total) into the  skin every 12 (twelve) hours.   gabapentin 100 MG capsule Commonly known as: NEURONTIN TAKE 1 CAPSULE(100 MG) BY MOUTH AT BEDTIME   omeprazole 20 MG capsule Commonly known as: PRILOSEC Take 20 mg by mouth daily.   Prolia 60 MG/ML Sosy injection Generic drug: denosumab Inject 60 mg into the skin every 6 (six) months.   Soothe 0.6-0.6 % Soln Generic drug: Propylene Glycol-Glycerin Place 1 drop into both eyes 2 (two) times daily as needed (dry eyes).   VITAMIN D3 PO Take 1 tablet by mouth daily.   warfarin 5 MG tablet Commonly known as: COUMADIN Take as directed. If you are unsure how to take this medication, talk to your nurse or doctor. Original instructions: Take Warfarin, 5mg  daily except 7.5mg  on Mondays or as directed by [facility]        Allergies  Allergen Reactions   Zoloft [Sertraline] Hives, Itching and Rash    Consultations: Neurosurgery  Procedures:   Discharge Exam: BP 118/78 (BP Location: Left Arm)   Pulse 90   Temp 97.9 F (36.6 C) (Axillary)   Resp 16   Ht 5\' 7"  (1.702 m)   Wt 62.6 kg   SpO2 100%   BMI 21.62 kg/m  Physical Exam Constitutional:      General: She is not in acute distress.    Appearance: Normal appearance.  HENT:     Head: Normocephalic and atraumatic.     Mouth/Throat:     Mouth: Mucous membranes are moist.  Eyes:     Extraocular Movements: Extraocular movements intact.     Pupils: Pupils are equal, round, and reactive to light.  Cardiovascular:     Rate and Rhythm: Normal rate and regular rhythm.  Pulmonary:     Effort: Pulmonary effort is normal.     Breath sounds: Normal breath sounds.  Abdominal:     General: Bowel sounds are normal. There is no distension.     Palpations: Abdomen is soft.     Tenderness: There is no abdominal tenderness.  Musculoskeletal:        General: Normal range of motion.     Cervical back: Normal range of motion and neck supple.  Skin:    General: Skin is warm and dry.   Neurological:     Mental Status: She is alert and oriented to person, place, and time.  Psychiatric:        Mood and Affect: Mood normal.      The results of significant diagnostics from this hospitalization (including imaging, microbiology, ancillary and laboratory) are listed below for reference.   Microbiology: No results found for this or any previous visit (from the past 240 hour(s)).   Labs: BNP (last 3 results) No results for input(s): "BNP" in the last 8760 hours. Basic Metabolic Panel: Recent Labs  Lab 04/26/22 0946 04/26/22 1010 04/29/22 0132 04/30/22 0026  NA 141 141 139 141  K 3.6 3.6 3.3* 3.7  CL 104  --  107 109  CO2 28  --  26 26  GLUCOSE 155*  --  134* 135*  BUN 26*  --  24* 20  CREATININE 0.77  --  0.71 0.75  CALCIUM 10.1  --  8.8* 9.2  MG  --   --  1.9 1.7   Liver Function Tests: Recent Labs  Lab 04/26/22 0946  AST 24  ALT 26  ALKPHOS 92  BILITOT 0.6  PROT 7.3  ALBUMIN 4.2   No results for input(s): "LIPASE", "AMYLASE" in the last 168 hours. Recent Labs  Lab 04/26/22 0953  AMMONIA 26   CBC: Recent Labs  Lab 04/26/22 0946 04/26/22 1010 04/27/22 0038 04/28/22 1502 04/29/22 0132 04/29/22 0838 04/30/22 0026  WBC 4.6  --  6.4  --  6.3 5.2 6.0  NEUTROABS 3.0  --   --   --  4.3  --  4.1  HGB 13.2   < > 12.2 12.0 11.8* 12.4 11.9*  HCT 41.6   < > 38.5 37.4 35.2* 37.7 37.8  MCV 88.7  --  87.9  --  86.9 88.1 87.5  PLT 181  --  173  --  165 158 171   < > = values in this interval not displayed.   Cardiac Enzymes: No results for input(s): "CKTOTAL", "CKMB", "CKMBINDEX", "TROPONINI" in the last 168 hours. BNP: Invalid input(s): "POCBNP" CBG: Recent Labs  Lab 04/26/22 0945  GLUCAP 167*   D-Dimer No results for input(s): "DDIMER" in the last 72 hours. Hgb A1c No results for input(s): "HGBA1C" in the last 72 hours. Lipid Profile No results for input(s): "CHOL", "HDL", "LDLCALC", "TRIG", "CHOLHDL", "LDLDIRECT" in the last 72  hours. Thyroid function studies No results for input(s): "TSH", "T4TOTAL", "T3FREE", "THYROIDAB" in the last 72 hours.  Invalid input(s): "FREET3" Anemia work up No results for input(s): "VITAMINB12", "FOLATE", "FERRITIN", "TIBC", "IRON", "RETICCTPCT" in the last 72 hours. Urinalysis    Component Value Date/Time   COLORURINE YELLOW 04/26/2022 1233   APPEARANCEUR HAZY (A) 04/26/2022 1233   LABSPEC 1.015 04/26/2022 1233   PHURINE 7.0 04/26/2022 1233   GLUCOSEU NEGATIVE 04/26/2022 1233   HGBUR MODERATE (A) 04/26/2022 1233   BILIRUBINUR NEGATIVE 04/26/2022 1233   KETONESUR NEGATIVE 04/26/2022 1233   PROTEINUR NEGATIVE 04/26/2022 1233   NITRITE NEGATIVE 04/26/2022 1233   LEUKOCYTESUR NEGATIVE 04/26/2022 1233   Sepsis Labs Recent Labs  Lab 04/27/22 0038 04/29/22 0132 04/29/22 0838 04/30/22 0026  WBC 6.4 6.3 5.2 6.0   Microbiology No results found for this or any previous visit (from the past 240 hour(s)).  Procedures/Studies: CT HEAD WO CONTRAST ( )  Result Date: 04/27/2022 CLINICAL DATA:  Subarachnoid hemorrhage EXAM: CT HEAD WITHOUT CONTRAST TECHNIQUE: Contiguous axial images were obtained from the base of the skull through the vertex without intravenous contrast. RADIATION DOSE REDUCTION: This exam was performed according to the departmental dose-optimization program which includes automated exposure control, adjustment of the mA and/or kV according to patient size and/or use of iterative reconstruction technique. COMPARISON:  Previous studies including the examination done on 04/26/2022 FINDINGS: Brain: There is previous right parietal craniotomy. There is encephalomalacia in the right parietal lobe with no significant change. In image 23 of series 3, there is 9 mm high density structure in the center of area of encephalomalacia, possibly calcification with no significant change. There is moderate amount of subarachnoid hemorrhage in the left parietal cortex with slight interval  increase in size. There is small focus of subarachnoid hemorrhage in the right parietal cortex with no significant change. There are no epidural or subdural fluid collections. Small old lacunar  infarcts are seen in the left thalamus and both cerebellar hemispheres with no significant interval change. Ventricles are not dilated. There is no evidence of blood within the ventricles. There is small lenticular shaped focal thickening in the posterior falx with no significant change. Cortical sulci are prominent suggesting atrophy. Vascular: Unremarkable. Skull: There is previous right parietal craniotomy. Sinuses/Orbits: Unremarkable. Other: None. IMPRESSION: There is moderate amount of subarachnoid hemorrhage in the left parietal cortex with slight interval increase. Small amount of subarachnoid hemorrhage in the right parietal cortex as not changed. There are no epidural or subdural fluid collections. There is no evidence of intraventricular blood. There is no significant focal mass effect. Other findings as described in the body of the report. Electronically Signed   By: Ernie Avena M.D.   On: 04/27/2022 08:23   CT ANGIO HEAD NECK W WO CM  Result Date: 04/26/2022 CLINICAL DATA:  Subarachnoid hemorrhage EXAM: CT ANGIOGRAPHY HEAD AND NECK TECHNIQUE: Multidetector CT imaging of the head and neck was performed using the standard protocol during bolus administration of intravenous contrast. Multiplanar CT image reconstructions and MIPs were obtained to evaluate the vascular anatomy. Carotid stenosis measurements (when applicable) are obtained utilizing NASCET criteria, using the distal internal carotid diameter as the denominator. RADIATION DOSE REDUCTION: This exam was performed according to the departmental dose-optimization program which includes automated exposure control, adjustment of the mA and/or kV according to patient size and/or use of iterative reconstruction technique. CONTRAST:  75mL OMNIPAQUE  IOHEXOL 350 MG/ML SOLN COMPARISON:  Same-day noncontrast CT head, CTA head and neck 07/02/2020 FINDINGS: CTA NECK FINDINGS Aortic arch: There is mild calcified atherosclerotic plaque in the imaged aortic arch. The origins of the major branch vessels are patent. The subclavian arteries are patent to the level imaged. Right carotid system: The right common, internal, and external carotid arteries are patent, without hemodynamically significant stenosis or occlusion. There is no dissection or aneurysm. Left carotid system: The left common, internal, and external carotid arteries are patent with mild calcified plaque of the bifurcation but no hemodynamically significant stenosis or occlusion. There is no dissection or aneurysm. Vertebral arteries: The vertebral arteries are patent, without hemodynamically significant stenosis or occlusion. There is no dissection or aneurysm. Skeleton: There is mild for age degenerative change of the cervical spine. There is no acute osseous abnormality or suspicious osseous lesion. Postsurgical changes are again noted in the right parietal bone. There is no visible canal hematoma. Other neck: The soft tissues of the neck are unremarkable. Upper chest: The imaged lung apices are clear. Review of the MIP images confirms the above findings CTA HEAD FINDINGS Anterior circulation: The intracranial ICAs are patent. The bilateral MCAs are patent. The bilateral ACAs are patent. The anterior communicating artery is normal. There is no aneurysm or AVM to explain the patient's subarachnoid hemorrhage. Posterior circulation: The bilateral V4 segments are patent. The major cerebellar artery origins are patent. The basilar artery is patent The bilateral PCAs are patent a left posterior communicating artery. Is seen. There is no aneurysm or AVM. Venous sinuses: Patent. Anatomic variants: None. Review of the MIP images confirms the above findings IMPRESSION: Patent vasculature of the head and neck with  no hemodynamically significant stenosis, occlusion, dissection, aneurysm, or AVM. Electronically Signed   By: Lesia Hausen M.D.   On: 04/26/2022 13:49   CT HEAD WO CONTRAST  Result Date: 04/26/2022 CLINICAL DATA:  Provided history: Mental status change, unknown cause. EXAM: CT HEAD WITHOUT CONTRAST TECHNIQUE: Contiguous axial images  were obtained from the base of the skull through the vertex without intravenous contrast. RADIATION DOSE REDUCTION: This exam was performed according to the departmental dose-optimization program which includes automated exposure control, adjustment of the mA and/or kV according to patient size and/or use of iterative reconstruction technique. COMPARISON:  Brain MRI 12/28/2021. Prior head CT examinations 12/28/2021 and earlier. FINDINGS: Brain: Mild generalized parenchymal atrophy. Acute subarachnoid hemorrhage overlying the left greater than right parietooccipital lobes, overall moderate in volume. Redemonstrated chronic focus of encephalomalacia/gliosis within the right frontoparietal lobes. As before, there is a superimposed 9 mm ovoid hyperdense nodular focus, likely reflecting dystrophic calcification. Background mild patchy and ill-defined hypoattenuation within the cerebral white matter, nonspecific but compatible chronic small vessel ischemic disease. Known chronic lacunar infarcts within the left thalamus, better appreciated on the prior MRI. Multiple small chronic infarcts within the bilateral cerebral hemispheres, many of which were also better appreciated on the prior MRI. Subtle focus of thickening along the posterior falx measuring 3 x 8 mm, unchanged and suspicious for a small meningioma (series 3, image 20) (series 5, image 52). No midline shift or hydrocephalus. Vascular: No hyperdense vessel.  Atherosclerotic calcifications. Skull: Right parietal cranioplasty. No acute fracture or aggressive osseous lesion. Sinuses/Orbits: No mass or acute finding within the imaged  orbits. Small mucous retention cysts within the right sphenoid sinus. Impression # called by telephone at the time of interpretation on 04/26/2022 at 11:51 am to provider [facility] , who verbally acknowledged these results. IMPRESSION: 1. Acute subarachnoid hemorrhage overlying the left greater than right parietooccipital lobes, overall moderate in volume. 2. Redemonstrated focus of chronic encephalomalacia/gliosis within the right frontoparietal lobes. As before, there is a superimposed 9 mm hyperdense nodular focus at this site, likely reflecting dystrophic calcification. 3. Background mild chronic small vessel ischemic changes within the cerebral white matter. 4. Known chronic infarcts within the left thalamus and bilateral cerebellar hemispheres. 5. Subtle focus of thickening along the posterior falx measuring 3 x 8 mm, unchanged and suspicious for a small meningioma. 6. Mild generalized parenchymal atrophy. Electronically Signed   By: Jackey Loge D.O.   On: 04/26/2022 11:58   DG Chest Port 1 View  Result Date: 04/26/2022 CLINICAL DATA:  Altered mental status EXAM: PORTABLE CHEST 1 VIEW COMPARISON:  2019 FINDINGS: No new consolidation or edema. Mild cardiomegaly. No pleural effusion or pneumothorax. IMPRESSION: No acute process in the chest.  Mild cardiomegaly. Electronically Signed   By: Guadlupe Spanish M.D.   On: 04/26/2022 10:02     Time coordinating discharge: Over 30 minutes    Lewie Chamber, MD  [facility] 04/30/2022, 10:52 AM

## 2022-04-30 NOTE — Progress Notes (Signed)
Patient arrieved via WC from 3W. Patient is A&O x 4, however patient states that sometime she stumbles over her words and can be forgetful. Usually continent of B&B. Scattered bruising on lower ABD and left AC. Denies pain discomfort at this time. Call light within reach

## 2022-04-30 NOTE — Progress Notes (Signed)
Physical Therapy Treatment Patient Details Name: [name] MRN: [id number] DOB: 10-17-1946 Today's Date: 04/30/2022   History of Present Illness Pt is a 76 y/o F presenting to ED with confusion and slurred speech, head CT revealing acute SAH L > R parieto-occipital lobes overall in moderate volume. PMH includes MR s/p mechanical mitral valve replacement, CVA s/p hemiparesis, HTN, and HLD.    PT Comments    Patient is making progress towards meeting PT goals. She is motivated to participate with therapy and is hopeful to discharge to rehab hospital soon. Gait training continued today with left AFO and rolling walker. She continues to have significant gait deficits and requires cues for safety/technique and physical assistance with mobility. Recommend inpatient rehab at discharge for ongoing PT to maximize independence and facilitate return to prior level of function.    Recommendations for follow up therapy are one component of a multi-disciplinary discharge planning process, led by the attending physician.  Recommendations may be updated based on patient status, additional functional criteria and insurance authorization.  Follow Up Recommendations  Acute inpatient rehab (3hours/day)     Assistance Recommended at Discharge Frequent or constant Supervision/Assistance  Patient can return home with the following A little help with walking and/or transfers;A little help with bathing/dressing/bathroom;Assistance with cooking/housework;Assist for transportation;Help with stairs or ramp for entrance   Equipment Recommendations  Rolling walker (2 wheels)    Recommendations for Other Services       Precautions / Restrictions Precautions Precautions: Fall Restrictions Weight Bearing Restrictions: No     Mobility  Bed Mobility Overal bed mobility: Needs Assistance Bed Mobility: Supine to Sit     Supine to sit: Supervision, HOB elevated Sit to supine: Min assist   General bed  mobility comments: increased time and effort required. assistance for LE support to return to bed. cues for sequencing    Transfers Overall transfer level: Needs assistance Equipment used: Rolling walker (2 wheels) Transfers: Sit to/from Stand Sit to Stand: Mod assist           General transfer comment: cues for LUE placement with transfers. lifting and lowering assistance required for transfers    Ambulation/Gait Ambulation/Gait assistance: Min assist, Mod assist Gait Distance (Feet): 130 Feet Assistive device: Rolling walker (2 wheels) (AFO LLE) Gait Pattern/deviations: Step-through pattern, Decreased stride length, Decreased stance time - left Gait velocity: decreased     General Gait Details: AFO donned prior to ambulating with maximal assistance from therapist. patient has varying step height and step length of LLE, frequently hitting the rolling walker with her lift shoe with swing phase of gait. faciliation for right side weight shifting and for rolling walker negotiation, especially with turns. gait pattern worsens with fatigue, distraction, and with direction changes despite cues.   Stairs             Wheelchair Mobility    Modified Rankin (Stroke Patients Only) Modified Rankin (Stroke Patients Only) Pre-Morbid Rankin Score: Moderate disability Modified Rankin: Moderately severe disability     Balance Overall balance assessment: Needs assistance Sitting-balance support: Feet supported Sitting balance-Leahy Scale: Good     Standing balance support: Bilateral upper extremity supported, Reliant on assistive device for balance Standing balance-Leahy Scale: Poor Standing balance comment: external support of walker and Min guard from therapist required to maintain static standing balance                            Cognition Arousal/Alertness: Awake/alert  Behavior During Therapy: WFL for tasks assessed/performed Overall Cognitive Status:  Impaired/Different from baseline Area of Impairment: Safety/judgement, Awareness                         Safety/Judgement: Decreased awareness of safety, Decreased awareness of deficits   Problem Solving: Slow processing, Difficulty sequencing, Requires verbal cues, Requires tactile cues          Exercises      General Comments        Pertinent Vitals/Pain Pain Assessment Pain Assessment: No/denies pain    Home Living                          Prior Function            PT Goals (current goals can now be found in the care plan section) Acute Rehab PT Goals Patient Stated Goal: to return to prior level of function PT Goal Formulation: With patient Time For Goal Achievement: 05/11/22 Potential to Achieve Goals: Good Progress towards PT goals: Progressing toward goals    Frequency    Min 3X/week      PT Plan Current plan remains appropriate    Co-evaluation              AM-PAC PT "6 Clicks" Mobility   Outcome Measure  Help needed turning from your back to your side while in a flat bed without using bedrails?: A Little Help needed moving from lying on your back to sitting on the side of a flat bed without using bedrails?: A Little Help needed moving to and from a bed to a chair (including a wheelchair)?: A Little Help needed standing up from a chair using your arms (e.g., wheelchair or bedside chair)?: A Little Help needed to walk in hospital room?: A Lot Help needed climbing 3-5 steps with a railing? : Total 6 Click Score: 15    End of Session Equipment Utilized During Treatment: Gait belt Activity Tolerance: Patient tolerated treatment well Patient left: in bed;with call bell/phone within reach;with bed alarm set Nurse Communication: Mobility status PT Visit Diagnosis: Other abnormalities of gait and mobility (R26.89);Other symptoms and signs involving the nervous system (R29.898)     Time: 1610-9604 PT Time Calculation (min)  (ACUTE ONLY): 33 min  Charges:  $Gait Training: 8-22 mins $Therapeutic Activity: 8-22 mins                    Donna Bernard, PT, MPT    [name] 04/30/2022, 11:30 AM

## 2022-05-01 LAB — PROTIME-INR
INR: 1.2 (ref 0.8–1.2)
Prothrombin Time: 15.1 seconds (ref 11.4–15.2)

## 2022-05-01 NOTE — Discharge Instructions (Addendum)
Inpatient Rehab Discharge Instructions  [name] Discharge date and time: No discharge date for patient encounter.   Activities/Precautions/ Functional Status: Activity: As tolerated Diet: Regular Wound Care: Routine skin checks Functional status:  ___ No restrictions     ___ Walk up steps independently ___ 24/7 supervision/assistance   ___ Walk up steps with assistance ___ Intermittent supervision/assistance  ___ Bathe/dress independently ___ Walk with walker     _x__ Bathe/dress with assistance ___ Walk Independently    ___ Shower independently ___ Walk with assistance    ___ Shower with assistance ___ No alcohol     ___ Return to work/school ________  Special Instructions: No driving smoking or alcohol      My questions have been answered and I understand these instructions. I will adhere to these goals and the provided educational materials after my discharge from the hospital.  Patient/Caregiver Signature _______________________________ Date __________  Clinician Signature _______________________________________ Date __________  Please bring this form and your medication list with you to all your follow-up doctor's appointments.

## 2022-05-01 NOTE — Evaluation (Signed)
Physical Therapy Assessment and Plan  Patient Details  Name: [name] MRN: [id number] Date of Birth: 04-25-1946  PT Diagnosis: Abnormal posture, Abnormality of gait, Ataxia, Cognitive deficits, Coordination disorder, Difficulty walking, Hemiparesis non-dominant, Impaired cognition, Impaired sensation, and Muscle weakness Rehab Potential: Good ELOS: ~1.5-2 weeks   Today's Date: 05/01/2022 PT Individual Time: 4098-1191 PT Individual Time Calculation (min): 71 min    Hospital Problem: Principal Problem:   SAH (subarachnoid hemorrhage) (HCC) Active Problems:   Subarachnoid hemorrhage (HCC)   Past Medical History:  Past Medical History:  Diagnosis Date   Abnormality of gait 09/07/2016   Allergy    Diabetes mellitus without complication (HCC)    Patient denies this - notes history of glucose intolerance   GERD (gastroesophageal reflux disease)    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) 09/07/2016   History of pneumonia 1997   Hypertension    S/P MVR (mitral valve replacement)    Mechanical mitral valve replacement at age 24 (done in [place])  // echo 7/17: EF 55-60, normal wall motion, bileaflet mechanical mitral valve prosthesis functioning normally, mild LAE, mildly reduced RVSF, small pericardial effusion   Stroke (HCC) 1997, 2013, 2015   Past Surgical History:  Past Surgical History:  Procedure Laterality Date   ABDOMINAL HYSTERECTOMY     BRAIN SURGERY     BUNIONECTOMY  1993   CARDIAC VALVE REPLACEMENT  1997   TOE SURGERY  1996    Assessment & Plan Clinical Impression: Patient is a 76 y.o. 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 Claudette Laws in the outpatient office.  Per chart review patient lives with spouse.  Two-level home bed and bath main level one-step to entry.  Uses a quad cane in the home, generally modified independent with mobility.  Presented 04/26/2022 with acute onset of slurred speech as well as altered mental status.  Family denied any recent trauma or fall.  Patient does have a history of chronic hip pain was scheduled to have injection 04/29/2022 thus her Coumadin had been held since 04/23/2022 and she had been placed on Lovenox bridge injections 1 mg/kilogram twice daily administered by her husband..  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  Chronic infarcts within the left thalamus and bilateral cerebellar hemispheres.  CT angiogram of head and neck no hemodynamically significant stenosis occlusion dissection or aneurysm.  Admission chemistries unremarkable aside glucose 155 BUN 26, hemoglobin A1c 6.1, ammonia level within normal limits, urinalysis negative nitrite, urine drug screen negative.  Hospital course discussed with neurosurgery/Dr. Jordan Likes with conservative care and repeat cranial CT scan 04/27/2022 again showing moderate amount of subarachnoid hemorrhage in left parietal cortex with slight interval increase.  Small amount of subarachnoid hemorrhage in the right parietal cortex not changed.  Plan on resuming Coumadin with Lovenox bridge 04/29/2022.  Patient tolerating a regular diet.  Therapy evaluations completed due to patient decreased functional mobility and slurred speech was admitted for a comprehensive rehab program.  Patient transferred to CIR on 04/30/2022 .   Patient currently requires min assist with mobility secondary to  muscle weakness, decreased cardiorespiratoy  endurance, impaired timing and sequencing, unbalanced muscle activation, ataxia, and decreased coordination, decreased attention, decreased awareness, decreased problem solving, decreased safety awareness, and delayed processing, and decreased standing balance, decreased postural control, and decreased balance strategies. Prior to hospitalization, patient was modified independent  with mobility and lived with Spouse in a House home.  Home access is 1Stairs to enter.  Patient will benefit from skilled PT intervention to maximize safe functional mobility, minimize fall risk, and decrease caregiver burden for planned discharge home with 24 hour supervision.  Anticipate patient will benefit from follow up OP at discharge.  PT - End of Session Activity Tolerance: Tolerates 30+ min activity with multiple rests Endurance Deficit: Yes Endurance Deficit Description: requires intermittent seated rest breaks PT Assessment Rehab Potential (ACUTE/IP ONLY): Good PT Patient demonstrates impairments in the following area(s): Balance;Perception;Behavior;Safety;Sensory;Skin Integrity;Endurance;Motor;Nutrition;Pain PT Transfers Functional Problem(s): Bed Mobility;Bed to Chair;Car;Furniture;Floor PT Locomotion Functional Problem(s): Ambulation;Stairs PT Plan PT Intensity: Minimum of 1-2 x/day ,45 to 90 minutes PT Frequency: 5 out of 7 days PT Duration Estimated Length of Stay: ~1.5-2 weeks PT Treatment/Interventions: Ambulation/gait training;Community reintegration;DME/adaptive equipment instruction;Neuromuscular re-education;Psychosocial support;Stair training;UE/LE Strength taining/ROM;Balance/vestibular training;Discharge planning;Functional electrical stimulation;Pain management;Skin care/wound management;Therapeutic Activities;UE/LE Coordination activities;Cognitive remediation/compensation;Disease management/prevention;Functional mobility training;Patient/family education;Splinting/orthotics;Therapeutic  Exercise;Visual/perceptual remediation/compensation PT Transfers Anticipated Outcome(s): supervision using LRAD PT Locomotion Anticipated Outcome(s): supervision using LRAD PT Recommendation Recommendations for Other Services: Therapeutic Recreation consult Therapeutic Recreation Interventions: Kitchen group;Outing/community reintergration Follow Up Recommendations: Outpatient PT;24 hour supervision/assistance Patient destination: Home Equipment Recommended: To be determined   PT Evaluation Precautions/Restrictions Precautions Precautions: Fall;Other (comment) Precaution Comments: pt has personal L LE AFO Restrictions Weight Bearing Restrictions: No Pain Pain Assessment Pain Scale: 0-10 Pain Score: 0-No pain Pain Interference Pain Interference Pain Effect on Sleep: 0. Does not apply - I have not had any pain or hurting in the past 5 days Pain Interference with Therapy Activities: 1. Rarely or not at all Pain Interference with Day-to-Day Activities: 1. Rarely or not at all Home Living/Prior Functioning Home Living Available Help at Discharge: Available 24 hours/day;Family Type of Home: House Home Access: Stairs to enter [employer] of Steps: 1 Entrance Stairs-Rails: None Home Layout: Two level;Able to live on main level with bedroom/bathroom Alternate Level Stairs-Number of Steps: flight Alternate Level Stairs-Rails: Right;Left  Lives With: Spouse (husband, Nadine Counts) Prior Function Level of Independence: Independent with gait;Independent with transfers;Requires assistive device for independence (was using SPC but transitioned to quad-cane due to increased L hip pain)  Able to Take Stairs?: Yes (only the 1 step-up into house; pt doesn't go upstairs in the house) Driving: No Vocation Requirements: pt still does light household tasks such as laundry and light cleaning otherwise has someone who comes for more thurough cleaning Vision/Perception   Vision - History Ability  to See in Adequate Light: 0 Adequate Perception Perception: Impaired Body Part Identification: impaired R/L awareness with only tactile stimulus Spatial Orientation: difficulty knowing when lined up with seat although with increased time able to correctly do this during session Praxis Praxis: Intact  Cognition Overall Cognitive Status: Impaired/Different from baseline Orientation Level: Oriented X4 Year: 2023 Month: June Day of Week: Correct Attention: Focused;Sustained;Selective Focused Attention: Appears intact Sustained Attention: Appears intact Selective Attention: Impaired Awareness: Impaired Safety/Judgment: Appears intact Sensation Sensation Light Touch: Impaired Detail Central sensation comments: decreased sensation in LLE (primarily can only feel more deep pressure rather than light touch) and no ability to identify where therapist is touching on L LE. Pt also having difficulty identifying location  of touch on R LE and also decreased sensation but not as impaired as LLE Light Touch Impaired Details: Impaired RLE;Impaired LLE Hot/Cold: Not tested Proprioception: Impaired Detail Proprioception Impaired Details: Impaired LLE Stereognosis: Not tested Coordination Gross Motor Movements are Fluid and Coordinated: No Coordination and Movement Description: impaired due to L hemiparesis, impaired sensation, and impaired coordination and balance Motor  Motor Motor: Ataxia;Other (comment) Motor - Skilled Clinical Observations: mild L hemiparesis   Trunk/Postural Assessment  Cervical Assessment Cervical Assessment: Exceptions to [facility] (forward flexed head) Thoracic Assessment Thoracic Assessment: Exceptions to [facility] (thoracic kyphosis with shoulders protracted) Lumbar Assessment Lumbar Assessment: Exceptions to [facility] (posterior pelvic tilt) Postural Control Postural Control: Deficits on evaluation Righting Reactions: delayed and inadequate Postural Limitations: decreased   Balance Balance Balance Assessed: Yes Static Sitting Balance Static Sitting - Balance Support: Feet supported Static Sitting - Level of Assistance: 5: Stand by assistance Dynamic Sitting Balance Dynamic Sitting - Balance Support: Feet supported Dynamic Sitting - Level of Assistance: 4: Min assist;Other (comment) (CGA) Static Standing Balance Static Standing - Balance Support: During functional activity Static Standing - Level of Assistance: 4: Min assist Dynamic Standing Balance Dynamic Standing - Balance Support: During functional activity Dynamic Standing - Level of Assistance: 4: Min assist;3: Mod assist Extremity Assessment      RLE Assessment RLE Assessment: Exceptions to [facility] Active Range of Motion (AROM) Comments: WFL/WNL RLE Strength Right Hip Flexion: 5/5 Right Knee Flexion: 5/5 Right Knee Extension: 5/5 Right Ankle Dorsiflexion: 4+/5 Right Ankle Plantar Flexion: 4+/5 LLE Assessment LLE Assessment: Exceptions to [facility] Active Range of Motion (AROM) Comments: WFL General Strength Comments: pt wears L LE AFO at baseline LLE Strength Left Hip Flexion: 3+/5 Left Knee Flexion: 3+/5 Left Knee Extension: 4-/5 Left Ankle Dorsiflexion: 3+/5 Left Ankle Plantar Flexion: 3+/5  Care Tool Care Tool Bed Mobility Roll left and right activity   Roll left and right assist level: Supervision/Verbal cueing    Sit to lying activity   Sit to lying assist level: Minimal Assistance - Patient > 75%    Lying to sitting on side of bed activity   Lying to sitting on side of bed assist level: the ability to move from lying on the back to sitting on the side of the bed with no back support.: Contact Guard/Touching assist     Care Tool Transfers Sit to stand transfer   Sit to stand assist level: Minimal Assistance - Patient > 75% Sit to stand assistive device: Cane  Chair/bed transfer   Chair/bed transfer assist level: Minimal Assistance - Patient > 75% Chair/bed transfer assistive  device: Radio producer transfer assist level: Minimal Assistance - Patient > 75% Architect Comment: quad cane    Care Tool Locomotion Ambulation   Assist level: Minimal Assistance - Patient > 75% Assistive device: Cane-quad Max distance: 185ft  Walk 10 feet activity   Assist level: Minimal Assistance - Patient > 75% Assistive device: Cane-quad   Walk 50 feet with 2 turns activity   Assist level: Minimal Assistance - Patient > 75% Assistive device: Cane-quad  Walk 150 feet activity Walk 150 feet activity did not occur: Safety/medical concerns      Walk 10 feet on uneven surfaces activity   Assist level: Minimal Assistance - Patient > 75% Assistive device: Cane-quad  Stairs   Assist level: Minimal Assistance - Patient > 75% Stairs assistive device: 2 hand rails Max  number of stairs: 2 (3" height)  Walk up/down 1 step activity   Walk up/down 1 step (curb) assist level: Minimal Assistance - Patient > 75% Walk up/down 1 step or curb assistive device: 2 hand rails  Walk up/down 4 steps activity Walk up/down 4 steps activity did not occur: Safety/medical concerns      Walk up/down 12 steps activity Walk up/down 12 steps activity did not occur: Safety/medical concerns      Pick up small objects from floor   Pick up small object from the floor assist level: Total Assistance - Patient < 25%    Wheelchair Is the patient using a wheelchair?: No (only for time management due to slow gait speed)          Wheel 50 feet with 2 turns activity      Wheel 150 feet activity        Refer to Care Plan for Long Term Goals  SHORT TERM GOAL WEEK 1 PT Short Term Goal 1 (Week 1): Pt will perform supine<>sit with supervision PT Short Term Goal 2 (Week 1): Pt will perform sit<>stands using LRAD with CGA PT Short Term Goal 3 (Week 1): Pt will perform bed<>chair transfers using LRAD with CGA PT Short Term Goal 4 (Week 1): Pt will  ambulate at least 126ft using LRAD with CGA PT Short Term Goal 5 (Week 1): Pt will participate in standardized outcome measure to assess fall risk  Recommendations for other services: Therapeutic Recreation  Stress management and Outing/community reintegration  Skilled Therapeutic Intervention Pt received supine in bed with her husband exiting upon therapist arrival and pt agreeable to therapy session. Evaluation completed (see details above) with patient education regarding purpose of PT evaluation, PT POC and goals, therapy schedule, weekly team meetings, and other CIR information including safety plan and fall risk safety. Pt wearing her personal L LE AFO throughout session. Pt completed the below functional mobility tasks with the specified levels of skilled cuing and physical assistance. Pt utilizing narrow based quad cane in R UE for mobility tasks throughout session. At end of session, pt left seated in recliner with needs in reach and seat belt alarm on.  Mobility Bed Mobility Bed Mobility: Supine to Sit;Sit to Supine Supine to Sit: Contact Guard/Touching assist Sit to Supine: Minimal Assistance - Patient > 75% Transfers Transfers: Sit to Stand;Stand to Sit;Stand Pivot Transfers Sit to Stand: Contact Guard/Touching assist;Minimal Assistance - Patient > 75% Stand to Sit: Contact Guard/Touching assist;Minimal Assistance - Patient > 75% Stand Pivot Transfers: Contact Guard/Touching assist;Minimal Assistance - Patient > 75% Stand Pivot Transfer Details: Verbal cues for sequencing;Verbal cues for technique;Verbal cues for precautions/safety;Verbal cues for safe use of DME/AE;Verbal cues for gait pattern;Tactile cues for sequencing;Tactile cues for weight shifting Transfer (Assistive device): Small based quad cane Locomotion  Gait Ambulation: Yes Gait Assistance: Minimal Assistance - Patient > 75% Gait Distance (Feet): 120 Feet Assistive device: 1 person hand held assist;Small based quad  cane (L HHA and small based QC in R UE) Gait Assistance Details: Verbal cues for precautions/safety;Verbal cues for sequencing;Verbal cues for technique;Verbal cues for gait pattern;Verbal cues for safe use of DME/AE;Tactile cues for sequencing;Tactile cues for weight shifting;Visual cues for safe use of DME/AE;Tactile cues for posture;Manual facilitation for weight shifting Gait Gait: Yes Gait Pattern: Impaired Gait Pattern: Poor foot clearance - left;Lateral trunk lean to right;Step-to pattern;Decreased step length - left;Decreased step length - right;Decreased stride length;Decreased hip/knee flexion - left;Left circumduction Gait velocity: severely decreased gait speed  Stairs / Additional Locomotion Stairs: Yes Stairs Assistance: Minimal Assistance - Patient > 75% Stair Management Technique: Two rails;Forwards;Backwards;Step to pattern (stepped up forward and then backwards off) Number of Stairs: 2 Height of Stairs: 3 Ramp: Minimal Assistance - Patient >75% (using quad cane) Curb: Minimal Assistance - Patient >75% (using quad cane) Wheelchair Mobility Wheelchair Mobility: No    Discharge Criteria: Patient will be discharged from PT if patient refuses treatment 3 consecutive times without medical reason, if treatment goals not met, if there is a change in medical status, if patient makes no progress towards goals or if patient is discharged from hospital.  The above assessment, treatment plan, treatment alternatives and goals were discussed and mutually agreed upon: by patient and by family  Ginny Forth , PT, DPT, NCS, CSRS 05/01/2022, 8:00 AM

## 2022-05-01 NOTE — Plan of Care (Signed)
  Problem: RH Balance Goal: LTG: Patient will maintain dynamic sitting balance (OT) Description: LTG:  Patient will maintain dynamic sitting balance with assistance during activities of daily living (OT) Flowsheets (Taken 05/01/2022 1602) LTG: Pt will maintain dynamic sitting balance during ADLs with: Independent with assistive device Goal: LTG Patient will maintain dynamic standing with ADLs (OT) Description: LTG:  Patient will maintain dynamic standing balance with assist during activities of daily living (OT)  Flowsheets (Taken 05/01/2022 1602) LTG: Pt will maintain dynamic standing balance during ADLs with: Supervision/Verbal cueing   Problem: Sit to Stand Goal: LTG:  Patient will perform sit to stand in prep for activites of daily living with assistance level (OT) Description: LTG:  Patient will perform sit to stand in prep for activites of daily living with assistance level (OT) Flowsheets (Taken 05/01/2022 1602) LTG: PT will perform sit to stand in prep for activites of daily living with assistance level: Supervision/Verbal cueing   Problem: RH Grooming Goal: LTG Patient will perform grooming w/assist,cues/equip (OT) Description: LTG: Patient will perform grooming with assist, with/without cues using equipment (OT) Flowsheets (Taken 05/01/2022 1602) LTG: Pt will perform grooming with assistance level of: Supervision/Verbal cueing   Problem: RH Bathing Goal: LTG Patient will bathe all body parts with assist levels (OT) Description: LTG: Patient will bathe all body parts with assist levels (OT) Flowsheets (Taken 05/01/2022 1602) LTG: Pt will perform bathing with assistance level/cueing: Supervision/Verbal cueing LTG: Position pt will perform bathing: At sink   Problem: RH Dressing Goal: LTG Patient will perform upper body dressing (OT) Description: LTG Patient will perform upper body dressing with assist, with/without cues (OT). Flowsheets (Taken 05/01/2022 1602) LTG: Pt will perform  upper body dressing with assistance level of: Supervision/Verbal cueing Goal: LTG Patient will perform lower body dressing w/assist (OT) Description: LTG: Patient will perform lower body dressing with assist, with/without cues in positioning using equipment (OT) Flowsheets (Taken 05/01/2022 1602) LTG: Pt will perform lower body dressing with assistance level of: Supervision/Verbal cueing   Problem: RH Toileting Goal: LTG Patient will perform toileting task (3/3 steps) with assistance level (OT) Description: LTG: Patient will perform toileting task (3/3 steps) with assistance level (OT)  Flowsheets (Taken 05/01/2022 1602) LTG: Pt will perform toileting task (3/3 steps) with assistance level: Supervision/Verbal cueing   Problem: RH Functional Use of Upper Extremity Goal: LTG Patient will use RT/LT upper extremity as a (OT) Description: LTG: Patient will use right/left upper extremity as a stabilizer/gross assist/diminished/nondominant/dominant level with assist, with/without cues during functional activity (OT) Flowsheets (Taken 05/01/2022 1602) LTG: Use of upper extremity in functional activities:  RUE as dominant level  LUE as gross assist level LTG: Pt will use upper extremity in functional activity with assistance level of: Independent   Problem: RH Laundry Goal: LTG Patient will perform laundry w/assist, cues (OT) Description: LTG: Patient will perform laundry with assistance, with/without cues (OT). Flowsheets (Taken 05/01/2022 1602) LTG: Pt will perform laundry with assistance level of: Supervision/Verbal cueing LTG: Pt will perform laundry with level of: Ambulate with device   Problem: RH Toilet Transfers Goal: LTG Patient will perform toilet transfers w/assist (OT) Description: LTG: Patient will perform toilet transfers with assist, with/without cues using equipment (OT) Flowsheets (Taken 05/01/2022 1602) LTG: Pt will perform toilet transfers with assistance level of:  Supervision/Verbal cueing

## 2022-05-02 LAB — PROTIME-INR
INR: 1.3 — ABNORMAL HIGH (ref 0.8–1.2)
Prothrombin Time: 15.9 seconds — ABNORMAL HIGH (ref 11.4–15.2)

## 2022-05-03 DIAGNOSIS — E876 Hypokalemia: Secondary | ICD-10-CM

## 2022-05-03 DIAGNOSIS — G4700 Insomnia, unspecified: Secondary | ICD-10-CM

## 2022-05-03 DIAGNOSIS — D649 Anemia, unspecified: Secondary | ICD-10-CM

## 2022-05-03 LAB — CBC WITH DIFFERENTIAL/PLATELET
Abs Immature Granulocytes: 0.02 10*3/uL (ref 0.00–0.07)
Basophils Absolute: 0 10*3/uL (ref 0.0–0.1)
Basophils Relative: 1 %
Eosinophils Absolute: 0.1 10*3/uL (ref 0.0–0.5)
Eosinophils Relative: 2 %
HCT: 35.8 % — ABNORMAL LOW (ref 36.0–46.0)
Hemoglobin: 11.4 g/dL — ABNORMAL LOW (ref 12.0–15.0)
Immature Granulocytes: 1 %
Lymphocytes Relative: 25 %
Lymphs Abs: 1.1 10*3/uL (ref 0.7–4.0)
MCH: 27.7 pg (ref 26.0–34.0)
MCHC: 31.8 g/dL (ref 30.0–36.0)
MCV: 87.1 fL (ref 80.0–100.0)
Monocytes Absolute: 0.3 10*3/uL (ref 0.1–1.0)
Monocytes Relative: 7 %
Neutro Abs: 2.9 10*3/uL (ref 1.7–7.7)
Neutrophils Relative %: 64 %
Platelets: 149 10*3/uL — ABNORMAL LOW (ref 150–400)
RBC: 4.11 MIL/uL (ref 3.87–5.11)
RDW: 13.9 % (ref 11.5–15.5)
WBC: 4.4 10*3/uL (ref 4.0–10.5)
nRBC: 0 % (ref 0.0–0.2)

## 2022-05-03 LAB — COMPREHENSIVE METABOLIC PANEL
ALT: 29 U/L (ref 0–44)
AST: 24 U/L (ref 15–41)
Albumin: 3.3 g/dL — ABNORMAL LOW (ref 3.5–5.0)
Alkaline Phosphatase: 73 U/L (ref 38–126)
Anion gap: 5 (ref 5–15)
BUN: 19 mg/dL (ref 8–23)
CO2: 28 mmol/L (ref 22–32)
Calcium: 9.1 mg/dL (ref 8.9–10.3)
Chloride: 105 mmol/L (ref 98–111)
Creatinine, Ser: 0.73 mg/dL (ref 0.44–1.00)
GFR, Estimated: >60 mL/min (ref >60)
Glucose, Bld: 118 mg/dL — ABNORMAL HIGH (ref 70–99)
Potassium: 3.4 mmol/L — ABNORMAL LOW (ref 3.5–5.1)
Sodium: 138 mmol/L (ref 135–145)
Total Bilirubin: 0.4 mg/dL (ref 0.3–1.2)
Total Protein: 5.8 g/dL — ABNORMAL LOW (ref 6.5–8.1)

## 2022-05-03 LAB — PROTIME-INR
INR: 1.4 — ABNORMAL HIGH (ref 0.8–1.2)
Prothrombin Time: 17 seconds — ABNORMAL HIGH (ref 11.4–15.2)

## 2022-05-03 MED ORDER — POTASSIUM CHLORIDE CRYS ER 20 MEQ PO TBCR
20.0000 meq | EXTENDED_RELEASE_TABLET | Freq: Two times a day (BID) | ORAL | Status: AC
  Administered 2022-05-03 (×2): 20 meq via ORAL
  Filled 2022-05-03 (×2): qty 1

## 2022-05-03 NOTE — Progress Notes (Signed)
Patient ID: [name], female   DOB: [birth year], 76 y.o.   MRN: [id number] Met with the patient to review situation, review rehab process, team conference and plan of care. Reviewed encephalomalacia and five previous strokes. Patient reports she is ready for all this to be over, no two were alike. Reviewed secondary risks including DM (A1C 6.1), HTN and HLD.  Reported not sleeping well; on neurontin @ HS like PTA; usually works well for her. Discussed medication and dietary modification recommendations Continue to follow along to discharge to address educational needs to facilitate preparation for discharge with spouse. [name]

## 2022-05-03 NOTE — Progress Notes (Signed)
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) 09/07/2016   History of pneumonia 1997   Hypertension    S/P MVR (mitral valve replacement)    Mechanical mitral valve replacement at age 61 (done in [place])  // echo 7/17: EF 55-60, normal wall motion, bileaflet mechanical mitral valve prosthesis functioning normally, mild LAE, mildly reduced RVSF, small pericardial effusion   Stroke (HCC) 1997, 2013, 2015   Past Surgical History:  Past Surgical History:  Procedure Laterality Date   ABDOMINAL HYSTERECTOMY     BRAIN SURGERY     BUNIONECTOMY  1993   CARDIAC VALVE REPLACEMENT  1997   TOE SURGERY  1996   Social History:  reports that she has never smoked. She has never been exposed to tobacco smoke. She has never used smokeless tobacco. She reports that she does not drink alcohol and does not use drugs.  Family / Support Systems Marital Status: Married Patient Roles: Spouse Spouse/Significant Other: Walker Shadow Children: Alroy Bailiff Anticipated Caregiver: spouse Ability/Limitations of Caregiver: none Caregiver Availability: 24/7 Family Dynamics: support from spouse  Social History Preferred language: English Religion: Publishing rights manager Issues: n/a Guardian/Conservator: n/a   Abuse/Neglect Abuse/Neglect Assessment Can Be Completed: Yes Physical Abuse: Denies Verbal Abuse:  Denies Sexual Abuse: Denies Exploitation of patient/patient's resources: Denies Self-Neglect: Denies  Patient response to: Social Isolation - How often do you feel lonely or isolated from those around you?: Never  Emotional Status Recent Psychosocial Issues: coping Psychiatric History: n/a Substance Abuse History: n/a  Patient / Family Perceptions, Expectations & Goals Pt/Family understanding of illness & functional limitations: yes Premorbid pt/family roles/activities: Previously MOD I Anticipated changes in roles/activities/participation: spouse able to assist with roles and tasks Pt/family expectations/goals: Supervision to [employer]: None Premorbid Home Care/DME Agencies: Other (Comment) Tour manager) Transportation available at discharge: spouse able to transport Is the patient able to respond to transportation needs?: Yes In the past 12 months, has lack of transportation kept you from medical appointments or from getting medications?: No In the past 12 months, has lack of transportation kept you from meetings, work, or from getting things needed for daily living?: No  Discharge Planning Living Arrangements: Spouse/significant other Support Systems: Spouse/significant other Type of Residence: Private residence Insurance Resources: [employer] Referred: No Living Expenses: Lives with family Money Management: Patient, Spouse Does the patient have any problems obtaining your medications?: No Home Management: Independent Patient/Family Preliminary Plans: Spouse able to assist Care Coordinator Barriers to Discharge: Lack of/limited family support, Decreased caregiver support Care Coordinator Anticipated Follow Up Needs: HH/OP Expected length of stay: 10-12 Days  Clinical Impression Sw met with patient and spouse. Introduced self and explained role. Patient will discharge home with spouse to provided assistance. Both patient  and spouse pleased thus far. No additional questions or concerns, sw will continue to follow up.  Andria Rhein 05/03/2022, 12:21 PM

## 2022-05-03 NOTE — Progress Notes (Signed)
PROGRESS NOTE   Subjective/Complaints: Walking back from bathroom initially when I came in the room.  Reports poor sleep, she would like to ask her husband later today what her home sleep medication is before starting sleep medication.   ROS- neg CP, SOB, N/V/D, fever, chills Objective:   No results found. Recent Labs    05/03/22 0616  WBC 4.4  HGB 11.4*  HCT 35.8*  PLT 149*    Recent Labs    05/03/22 0616  NA 138  K 3.4*  CL 105  CO2 28  GLUCOSE 118*  BUN 19  CREATININE 0.73  CALCIUM 9.1     Intake/Output Summary (Last 24 hours) at 05/03/2022 0940 Last data filed at 05/03/2022 0700 Gross per 24 hour  Intake 358 ml  Output --  Net 358 ml         Physical Exam: Vital Signs Blood pressure 113/75, pulse 96, temperature 97.8 F (36.6 C), resp. rate 16, height 5\' 7"  (1.702 m), weight 55 kg, SpO2 97 %.  General: No acute distress Mood and affect are appropriate Heart: Regular rate and rhythm no rubs murmurs or extra sounds Lungs: Clear to auscultation, breathing unlabored, no rales or wheezes Abdomen: Positive bowel sounds, soft nontender to palpation, nondistended Extremities: No clubbing, cyanosis, or edema Skin: No evidence of breakdown, no evidence of rash, warm and dry Neurologic: Cranial nerves II through XII intact, motor strength is 4/5 in bilateral deltoid, bicep, tricep, grip, hip flexor, knee extensors, ankle dorsiflexor and plantar flexor Sensory exam normal sensation to light touch lower extremities Speech min dysarthria , sentence level , occ word finding def, but appears at baseline  Musculoskeletal: Full range of motion in all 4 extremities. No joint swelling    Assessment/Plan: 1. Functional deficits which require 3+ hours per day of interdisciplinary therapy in a comprehensive inpatient rehab setting. Physiatrist is providing close team supervision and 24 hour management of active  medical problems listed below. Physiatrist and rehab team continue to assess barriers to discharge/monitor patient progress toward functional and medical goals  Care Tool:  Bathing    Body parts bathed by patient: Right arm, Left arm, Chest, Abdomen, Front perineal area, Right upper leg, Left upper leg, Face, Buttocks, Right lower leg, Left lower leg   Body parts bathed by helper: Buttocks     Bathing assist Assist Level: Contact Guard/Touching assist     Upper Body Dressing/Undressing Upper body dressing   What is the patient wearing?: Pull over shirt, Bra    Upper body assist Assist Level: Minimal Assistance - Patient > 75%    Lower Body Dressing/Undressing Lower body dressing      What is the patient wearing?: Pants, Underwear/pull up     Lower body assist Assist for lower body dressing: Minimal Assistance - Patient > 75%     Toileting Toileting Toileting Activity did not occur (Clothing management and hygiene only): N/A (no void or bm)  Toileting assist Assist for toileting: Contact Guard/Touching assist     Transfers Chair/bed transfer  Transfers assist     Chair/bed transfer assist level: Minimal Assistance - Patient > 75% Chair/bed transfer assistive device: Environmental health practitioner  Ambulation   Ambulation assist      Assist level: Minimal Assistance - Patient > 75% Assistive device: Cane-quad Max distance: 155ft   Walk 10 feet activity   Assist     Assist level: Minimal Assistance - Patient > 75% Assistive device: Cane-quad   Walk 50 feet activity   Assist    Assist level: Minimal Assistance - Patient > 75% Assistive device: Cane-quad    Walk 150 feet activity   Assist Walk 150 feet activity did not occur: Safety/medical concerns         Walk 10 feet on uneven surface  activity   Assist     Assist level: Minimal Assistance - Patient > 75% Assistive device: Cane-quad   Wheelchair     Assist Is the patient using a  wheelchair?: No (only for time management due to slow gait speed)             Wheelchair 50 feet with 2 turns activity    Assist            Wheelchair 150 feet activity     Assist          Blood pressure 113/75, pulse 96, temperature 97.8 F (36.6 C), resp. rate 16, height 5\' 7"  (1.702 m), weight 55 kg, SpO2 97 %.  Medical Problem List and Plan: 1. Functional deficits secondary to [facility] secondary to anticoagulation as well as history of CVA x2 with residual left-sided weakness/brain surgery with craniotomy             -patient may shower             -ELOS/Goals: 7-9 days             -Continue CIR PT,OT,SLP 2.  Antithrombotics: -DVT/anticoagulation: Coumadin resumed 04/29/2022 with bridging of Lovenox.  INR goal 2.5-3.5             -antiplatelet therapy: N/A 3. Pain: continue Neurontin 100 mg nightly 4. Mood/Sleep: Provide emotional support             -antipsychotic agents: N/A 5. Neuropsych/cognition: This patient is capable of making decisions on her own behalf. 6. Skin/Wound Care: Routine skin checks 7. Fluids/Electrolytes/Nutrition: Routine in and outs with follow-up chemistries 8.  History of mitral valve replacement 1997.  Resuming Coumadin with bridging of Lovenox 04/29/2022.  Follow-up per cardiology services as needed 9.  Hyperlipidemia.  Continue Lipitor 10.  Hypertension.  Continue Norvasc 5 mg daily Vitals:   05/02/22 1931 05/03/22 0422  BP: 107/66 113/75  Pulse: 90 96  Resp: 16 16  Temp: 98.2 F (36.8 C) 97.8 F (36.6 C)  SpO2: 95% 97%   Controlled 05/02/22 11.  Prediabetes.  Hemoglobin A1c 6.1.  Diet controlled/SSI discontinued 12.  History of lumbar spinal stenosis  with increased radicular pain , would not rec reschedule  ESI due to anticoag issues  13.  GERD.  Protonix 14. Insomnia She plans to find out what medication she takes at home for consideration of use at the hospital 15. Hypokalemia  -KCL x2 , recheck  16. Anemia  mild  -Continue to monitor    LOS: 3 days A FACE TO FACE EVALUATION WAS PERFORMED  Fanny Dance 05/03/2022, 9:40 AM

## 2022-05-04 LAB — BASIC METABOLIC PANEL
Anion gap: 7 (ref 5–15)
BUN: 20 mg/dL (ref 8–23)
CO2: 29 mmol/L (ref 22–32)
Calcium: 9.7 mg/dL (ref 8.9–10.3)
Chloride: 106 mmol/L (ref 98–111)
Creatinine, Ser: 0.87 mg/dL (ref 0.44–1.00)
GFR, Estimated: >60 mL/min (ref >60)
Glucose, Bld: 122 mg/dL — ABNORMAL HIGH (ref 70–99)
Potassium: 4.1 mmol/L (ref 3.5–5.1)
Sodium: 142 mmol/L (ref 135–145)

## 2022-05-04 LAB — PROTIME-INR
INR: 1.4 — ABNORMAL HIGH (ref 0.8–1.2)
Prothrombin Time: 17.3 seconds — ABNORMAL HIGH (ref 11.4–15.2)

## 2022-05-04 LAB — GLUCOSE, CAPILLARY: Glucose-Capillary: 186 mg/dL — ABNORMAL HIGH (ref 70–99)

## 2022-05-04 NOTE — Progress Notes (Signed)
Speech Language Pathology Daily Session Note  Patient Details  Name: [name] MRN: [id number] Date of Birth: 1945-12-01  Today's Date: 05/04/2022 SLP Individual Time: 1400-1505 SLP Individual Time Calculation (min): 65 min  Short Term Goals: Week 1: SLP Short Term Goal 1 (Week 1): Pt will use compensatory word finding strategies at the conversational level with supervision verbal cues. SLP Short Term Goal 2 (Week 1): Pt will recognize and correct written errors at the sentence level with supervision verbal cues. SLP Short Term Goal 3 (Week 1): Pt will utilize comensatory memory strategies to recall daily, functional information with min assist.  Skilled Therapeutic Interventions: Skilled ST treatment focused on communication and cognitive goals. SLP facilitated session by providing sup fading to mod I for high level word finding with convergent and divergent naming tasks. SLP did not witness any instances of word finding difficulty at the conversation level. Pt reports only occasional instances which seem to occur infrequently. SLP facilitated moderately complex working memory task with sup A verbal cues to achieve 90% accuracy. Pt was 81% accurate without SLP support. Pt reported she felt she would have done better with task prior to stroke and feels her memory has been mildly affected. SLP provided education on various compensatory memory strategies. Pt verbalized understanding and provided examples on how to implement them in her daily life. Patient transferred to bed using quad cane and CGA with alarm activated and immediate needs within reach at end of session. Continue per current plan of care.      Pain Pain Assessment Pain Scale: 0-10 Pain Score: 0-No pain  Therapy/Group: Individual Therapy  Tamala Ser 05/04/2022, 2:16 PM

## 2022-05-04 NOTE — Progress Notes (Signed)
Occupational Therapy Session Note  Patient Details  Name: MIRZA HRON MRN: [id number] Date of Birth: [birth year]  Today's Date: 05/04/2022 OT Individual Time: 1306-1350 OT Individual Time Calculation (min): 44 min    Short Term Goals: Week 1:  OT Short Term Goal 1 (Week 1): Pt to be CGA for functional transfers with LRD OT Short Term Goal 2 (Week 1): Pt to be Min A for LB dressing/bathing with LRD OT Short Term Goal 3 (Week 1): Pt to be Min A for toileting tasks with LRD OT Short Term Goal 4 (Week 1): Pt to demonstrate increased coordination to the R UE during functional tasks  Skilled Therapeutic Interventions/Progress Updates:  Pt greeted supine in bed   agreeable to OT intervention. Pt completed supine>sit with CGA, pt completed ambulatory toilet transfer with quad cane and MIN A. Decreased clearance in LLE at times. Pt completed 3/3 toileting tasks with CGA. Pt completed functional ambulation to sink with quad cane and MIN A, CGA for standing hand hygiene.  Pt transported to gym in w/c with total A for time mgmt. Pt reports wanting to work on her hand writing as one of her hobbies is writing cards to church members. Worked on Public relations account executive to facilitate improved proprioception to RUE. Pt able to trace letters in her name with + time and effort with built up pen. Graded task up and had pt free write her name on line with pt noted to confuse letters such as "n" and "u" but did have awareness to mistake. Pt needed increased time and effort to free write name but completed task with supervision. Issued pt handouts to work on Public relations account executive in room as well as built up pen.  Pt transported back to room with total A where pt left up in w/c with alarm belt activated and all needs within reach.   Therapy Documentation Precautions:  Precautions Precautions: Fall, Other (comment) Precaution Comments: pt has personal L LE AFO Restrictions Weight Bearing Restrictions: No  Pain: no pain  reported during session     Therapy/Group: Individual Therapy  Barron Schmid 05/04/2022, 3:38 PM

## 2022-05-04 NOTE — Progress Notes (Addendum)
Physical Therapy Session Note  Patient Details  Name: [name] MRN: [id number] Date of Birth: Sep 17, 1946  Today's Date: 05/04/2022 PT Individual Time: 0904-1005 PT Individual Time Calculation (min): 61 min   Short Term Goals: Week 1:  PT Short Term Goal 1 (Week 1): Pt will perform supine<>sit with supervision PT Short Term Goal 2 (Week 1): Pt will perform sit<>stands using LRAD with CGA PT Short Term Goal 3 (Week 1): Pt will perform bed<>chair transfers using LRAD with CGA PT Short Term Goal 4 (Week 1): Pt will ambulate at least 143f using LRAD with CGA PT Short Term Goal 5 (Week 1): Pt will participate in standardized outcome measure to assess fall risk   Skilled Therapeutic Interventions/Progress Updates:  Patient sitting in w/c on entrance to room. Husband present. Patient alert and agreeable to PT session.   Patient with no pain complaint throughout session. Demos fatigue at end of session.   Therapeutic Activity: Transfers: Patient performed sit<>stand and stand pivot transfers throughout session with CGA. Provided verbal cues for improving technique.  Gait Training:  Patient ambulated 175' x1/ 100' x1 using RW with CGA. First bout with partial step through gait pattern with foot flat contact following swing through. Provided consistent vc/ tc for improving Bil knee extension following swing through and  heel strike for initial contact.   Neuromuscular Re-ed: NMR facilitated during session with focus on standing balance and muscle activation. Pt guided in toe taps to 6" step with 2x5 reps prior to seated rest. Followed another 2x5reps bilaterally. Improved foot clearance in gait following NMR. NMR performed for improvements in motor control and coordination, balance, sequencing, judgement, and self confidence/ efficacy in performing all aspects of mobility at highest level of independence.   Patient seated upright  in w/c at end of session with brakes locked, no alarm set as  husband in room, and all needs within reach.   Therapy Documentation Precautions:  Precautions Precautions: Fall, Other (comment) Precaution Comments: pt has personal L LE AFO Restrictions Weight Bearing Restrictions: No General:   Vital Signs:   Pain: Pain Assessment Pain Scale: 0-10 Pain Score: 0-No pain  Therapy/Group: Individual Therapy  JAlger SimonsPT, DPT, CSRS 05/04/2022, 11:32 AM

## 2022-05-05 LAB — PROTIME-INR
INR: 1.7 — ABNORMAL HIGH (ref 0.8–1.2)
Prothrombin Time: 19.6 seconds — ABNORMAL HIGH (ref 11.4–15.2)

## 2022-05-05 LAB — GLUCOSE, CAPILLARY: Glucose-Capillary: 121 mg/dL — ABNORMAL HIGH (ref 70–99)

## 2022-05-05 NOTE — Progress Notes (Signed)
Physical Therapy Session Note  Patient Details  Name: [name] MRN: [id number] Date of Birth: Aug 07, 1946  Today's Date: 05/05/2022 PT Individual Time: 1030-1100 PT Individual Time Calculation (min): 30 min   Short Term Goals: Week 1:  PT Short Term Goal 1 (Week 1): Pt will perform supine<>sit with supervision PT Short Term Goal 2 (Week 1): Pt will perform sit<>stands using LRAD with CGA PT Short Term Goal 3 (Week 1): Pt will perform bed<>chair transfers using LRAD with CGA PT Short Term Goal 4 (Week 1): Pt will ambulate at least 154f using LRAD with CGA PT Short Term Goal 5 (Week 1): Pt will participate in standardized outcome measure to assess fall risk  Skilled Therapeutic Interventions/Progress Updates:    Pt received seated in w/c in room, agreeable to PT session. Pt reports feeling very fatigued this AM and also reports having a dull headache, declines intervention. Sit to stand with min A and SBQC during session. Ambulation x 200 ft with min A and SBQC with cues at times needed for gait sequencing. Standing alt L/R 6" step-taps with B handrails and min A for balance with focus on hip strengthening and LE coordination, 2 x 10 reps to fatigue. Toilet transfer with [facility] LLCand min A. Pt is Supervision for balance while performing clothing management and pericare. Pt requests to return to bed at end of session due to fatigue. Sit to supine Supervision. Pt left seated in bed with needs in reach, bed alarm in place.  Therapy Documentation Precautions:  Precautions Precautions: Fall, Other (comment) Precaution Comments: pt has personal L LE AFO Restrictions Weight Bearing Restrictions: No       Therapy/Group: Individual Therapy   TExcell Seltzer PT, DPT, CSRS 05/05/2022, 12:42 PM

## 2022-05-05 NOTE — Progress Notes (Signed)
Patient ID: [name], female   DOB: 11/03/1946, 76 y.o.   MRN: [id number]  Team Conference Report to Patient/Family  Team Conference discussion was reviewed with the patient and caregiver, including goals, any changes in plan of care and target discharge date.  Patient and caregiver express understanding and are in agreement.  The patient has a target discharge date of 05/14/22.  SW met with patient and spouse and provided team conference updates. Patient shares that she would like to decline her TTB due to not bathing in the tub at home. Patient prefers to use her quad cane at discharge, SW will discuss with therapy team. Patient and spouse prefer OP therapy for follow up. Patient spouse will attend family education on 7/15 9-12. No additional questions or concerns, sw will continue to follow up.   [name] 05/05/2022, 1:19 PM

## 2022-05-05 NOTE — Progress Notes (Addendum)
PROGRESS NOTE   Subjective/Complaints:  Husband at bedside   ROS- neg CP, SOB, N/V/D, fever, chills Objective:   No results found. Recent Labs    05/03/22 0616  WBC 4.4  HGB 11.4*  HCT 35.8*  PLT 149*    Recent Labs    05/03/22 0616 05/04/22 0610  NA 138 142  K 3.4* 4.1  CL 105 106  CO2 28 29  GLUCOSE 118* 122*  BUN 19 20  CREATININE 0.73 0.87  CALCIUM 9.1 9.7     Intake/Output Summary (Last 24 hours) at 05/05/2022 0758 Last data filed at 05/04/2022 2030 Gross per 24 hour  Intake 540 ml  Output --  Net 540 ml         Physical Exam: Vital Signs Blood pressure 128/81, pulse 70, temperature 98.8 F (37.1 C), resp. rate 14, height $RemoveBe'5\' 7"'pibHiYqTY$  (1.702 m), weight 55 kg, SpO2 98 %.  General: No acute distress Mood and affect are appropriate Heart: Regular rate and rhythm no rubs murmurs or extra sounds Lungs: Clear to auscultation, breathing unlabored, no rales or wheezes Abdomen: Positive bowel sounds, soft nontender to palpation, nondistended Extremities: No clubbing, cyanosis, or edema Skin: No evidence of breakdown, no evidence of rash, warm and dry Neurologic: Cranial nerves II through XII intact, motor strength is 4/5 in bilateral deltoid, bicep, tricep, grip, hip flexor, knee extensors, ankle dorsiflexor and plantar flexor Sensory exam normal sensation to light touch lower extremities Speech min dysarthria , sentence level , occ word finding def, but appears at baseline  Musculoskeletal: Full range of motion in all 4 extremities. No joint swelling    Assessment/Plan: 1. Functional deficits which require 3+ hours per day of interdisciplinary therapy in a comprehensive inpatient rehab setting. Physiatrist is providing close team supervision and 24 hour management of active medical problems listed below. Physiatrist and rehab team continue to assess barriers to discharge/monitor patient progress toward  functional and medical goals  Care Tool:  Bathing    Body parts bathed by patient: Right arm, Left arm, Chest, Abdomen, Front perineal area, Right upper leg, Left upper leg, Face, Buttocks, Right lower leg, Left lower leg   Body parts bathed by helper: Buttocks     Bathing assist Assist Level: Contact Guard/Touching assist     Upper Body Dressing/Undressing Upper body dressing   What is the patient wearing?: Pull over shirt, Bra    Upper body assist Assist Level: Minimal Assistance - Patient > 75%    Lower Body Dressing/Undressing Lower body dressing      What is the patient wearing?: Pants, Underwear/pull up     Lower body assist Assist for lower body dressing: Minimal Assistance - Patient > 75%     Toileting Toileting Toileting Activity did not occur (Clothing management and hygiene only): N/A (no void or bm)  Toileting assist Assist for toileting: Contact Guard/Touching assist     Transfers Chair/bed transfer  Transfers assist     Chair/bed transfer assist level: Contact Guard/Touching assist Chair/bed transfer assistive device: Programmer, multimedia   Ambulation assist      Assist level: Contact Guard/Touching assist Assistive device: Walker-rolling Max distance: 60  Walk 10 feet activity   Assist     Assist level: Contact Guard/Touching assist Assistive device: Walker-rolling   Walk 50 feet activity   Assist    Assist level: Contact Guard/Touching assist Assistive device: Walker-rolling    Walk 150 feet activity   Assist Walk 150 feet activity did not occur: Safety/medical concerns         Walk 10 feet on uneven surface  activity   Assist     Assist level: Minimal Assistance - Patient > 75% Assistive device: Cane-quad   Wheelchair     Assist Is the patient using a wheelchair?: No (only for time management due to slow gait speed)             Wheelchair 50 feet with 2 turns  activity    Assist            Wheelchair 150 feet activity     Assist          Blood pressure 128/81, pulse 70, temperature 98.8 F (37.1 C), resp. rate 14, height $RemoveBe'5\' 7"'wWMNEHKyW$  (1.702 m), weight 55 kg, SpO2 98 %.  Medical Problem List and Plan: 1. 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 Team conference today please see physician documentation under team conference tab, met with team  to discuss problems,progress, and goals. Formulized individual treatment plan based on medical history, underlying problem and comorbidities.              -Continue CIR PT,OT,SLP 2.  Antithrombotics: -DVT/anticoagulation: Coumadin resumed 04/29/2022 with bridging of Lovenox.  INR goal 2.5-3.5, INR still low , cont lovenox 62.$RemoveBeforeD'5mg'ykPhAuPkdkNDsm$  q12h INR 1.7              -antiplatelet therapy: N/A 3. Pain: continue Neurontin 100 mg nightly 4. Mood/Sleep: Provide emotional support             -antipsychotic agents: N/A 5. Neuropsych/cognition: This patient is capable of making decisions on her own behalf. 6. Skin/Wound Care: Routine skin checks 7. Fluids/Electrolytes/Nutrition: Routine in and outs with follow-up chemistries 8.  History of mitral valve replacement 1997.  Resuming Coumadin with bridging of Lovenox 04/29/2022.  Follow-up per cardiology services as needed 9.  Hyperlipidemia.  Continue Lipitor 10.  Hypertension.  Continue Norvasc 5 mg daily Vitals:   05/04/22 2001 05/05/22 0543  BP: 106/66 128/81  Pulse: 91 70  Resp: 14 14  Temp: 98.7 F (37.1 C) 98.8 F (37.1 C)  SpO2: 97% 98%   Controlled 05/05/22 11.  Prediabetes.  Hemoglobin A1c 6.1.  Diet controlled/SSI discontinued 12.  History of lumbar spinal stenosis  with increased radicular pain , would not rec reschedule  ESI due to anticoag issues  13.  GERD.   Protonix 14. Insomnia She plans to find out what medication she takes at home for consideration of use at the hospital 15. Hypokalemia  -KCL 51meq x2 , recheck  16. Anemia mild  -Continue to monitor    LOS: 5 days A FACE TO FACE EVALUATION WAS PERFORMED  Charlett Blake 05/05/2022, 7:58 AM

## 2022-05-05 NOTE — Progress Notes (Signed)
Occupational Therapy Session Note  Patient Details  Name: [name] MRN: [id number] Date of Birth: [birth year]  Today's Date: 05/05/2022 OT Individual Time: 9741-6384 OT Individual Time Calculation (min): 62 min    Short Term Goals: Week 1:  OT Short Term Goal 1 (Week 1): Pt to be CGA for functional transfers with LRD OT Short Term Goal 2 (Week 1): Pt to be Min A for LB dressing/bathing with LRD OT Short Term Goal 3 (Week 1): Pt to be Min A for toileting tasks with LRD OT Short Term Goal 4 (Week 1): Pt to demonstrate increased coordination to the R UE during functional tasks  Skilled Therapeutic Interventions/Progress Updates:  Pt greeted seated in recliner  agreeable to OT intervention. Session focus on BADL reeducation, functional mobility, dynamic standing balance and decreasing overall caregiver burden.       Pt with several questions regarding compensatory strategies for tremors, education provided on AE such as weighted utensils, cup with a handle,travel extended straws, or weighted gloves. Provided education on where to obtain items to increase functional independence.   Pt completed stand pivot to w/c with quad cane and CGA.  Pt completed bathing at sink with CGA, pt able to stand with CGA while pt washed LB in standing. Pt reports not getting in shower at home. MIN A to don OH shirt, noted perceptual deficits when threading UEs through sleeves needing MIN A for correct orientation of shirt. Pt donned underwear and pants via sit>stand with MIN A to pull up pants to waist line. Pt was even able to don teds with increased time and effort, did provide education on AE for donning TEDs. pt left seated in w/c with all needs within reach and alarm belt activated.                      Therapy Documentation Precautions:  Precautions Precautions: Fall, Other (comment) Precaution Comments: pt has personal L LE AFO Restrictions Weight Bearing Restrictions: No  Pain: no pain reported  during session    Therapy/Group: Individual Therapy  Corinne Ports [facility] 05/05/2022, 12:05 PM

## 2022-05-05 NOTE — Progress Notes (Signed)
[name] for warfarin w/ Lovenox to bridge Indication:  mechanical mitral valve  Allergies  Allergen Reactions   Zoloft [Sertraline] Hives, Itching and Rash    Patient Measurements: Height: '5\' 7"'$  (170.2 cm) Weight: 55 kg (121 lb 4.1 oz) IBW/kg (Calculated) : 61.6  Vital Signs: Temp: 98.8 F (37.1 C) (06/28 0543) BP: 128/81 (06/28 0543) Pulse Rate: 70 (06/28 0543)  Labs: Recent Labs    05/03/22 0616 05/04/22 0610 05/05/22 0515  HGB 11.4*  --   --   HCT 35.8*  --   --   PLT 149*  --   --   LABPROT 17.0* 17.3* 19.6*  INR 1.4* 1.4* 1.7*  CREATININE 0.73 0.87  --      Estimated Creatinine Clearance: 48.5 mL/min (by C-G formula based on SCr of 0.87 mg/dL).  Assessment: [name] who presented to the ED with confusion and slurred speech. 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 She has a PMH positive for mMVR in 1997. Lovenox / warfarin from PTA was held on admission and resumed 6/22. Pharmacy consulted to manage Lovenox / warfarin during rehab admission.  INR slowly trending up   Goal of Therapy:  Anti-Xa level 0.6-1 units/ml 4hrs after LMWH dose given INR 2.5-3.5 Monitor platelets by anticoagulation protocol: Yes   Plan:  Continue Lovenox 62.5 mg SQ q12h Warfarin as PTA doses: 5 mg PO daily except 7.5 mg on Mondays INR daily, CBC on Mon DC Lovenox when INR is >/=2.5 X2 measurements in a row Monitor for signs of bleeding  Thank you Anette Guarneri, PharmD  05/05/2022 8:04 AM  **Pharmacist phone directory can be found on [email or web address] listed under [place]**

## 2022-05-05 NOTE — Progress Notes (Signed)
Physical Therapy Session Note  Patient Details  Name: [name] MRN: [id number] Date of Birth: [birth year]  Today's Date: 05/05/2022 PT Individual Time: 1300-1400 PT Individual Time Calculation (min): 60 min   Short Term Goals: Week 1:  PT Short Term Goal 1 (Week 1): Pt will perform supine<>sit with supervision PT Short Term Goal 2 (Week 1): Pt will perform sit<>stands using LRAD with CGA PT Short Term Goal 3 (Week 1): Pt will perform bed<>chair transfers using LRAD with CGA PT Short Term Goal 4 (Week 1): Pt will ambulate at least 135f using LRAD with CGA PT Short Term Goal 5 (Week 1): Pt will participate in standardized outcome measure to assess fall risk  Skilled Therapeutic Interventions/Progress Updates:    Patient in supine with spouse present and pt finishing lunch.  Reports difficulty using fork without built up handle. RN to get one from OT.  Patient requesting to toilet. Supine to sit with S with HOB up.  Patient toileted with CGA and cues for hand placement.  Patient ambulated to sink with cane and CGA.  Washing hands with CGA.  Patient assisted in wheelchair to ortho gym.  Performed sit to stand with S and ambulated with cane CGA 40', 80, and 120' with cues throughout for posture.  Seated in chair with back to wall to work on postural strengthening.  Performed chin tucks with pillow at head x 10 w/ 5 sec hold, scapular squeezes x 10 w/ 5 sec hold.  Standing with back to wall work on standing corrections with chin tuck x 3 reps and scapular squeezes.  Patient needing mod to max tactile, demonstration and verbal cues for technique with chin tucks.  Patient performed car transfer with cane and min A for L LE into car (she reports was getting help for this frequently prior to this hospitalization).  Patient negotiated 1 step with cane and min a mod cues for sequencing.  Negotiated ramp with cane and minguard A after demonstration and mod cues for technique.  Patient left up in recliner  with call bell in reach and spouse in the room.   Therapy Documentation Precautions:  Precautions Precautions: Fall, Other (comment) Precaution Comments: pt has personal L LE AFO Restrictions Weight Bearing Restrictions: No  Pain: Pain Assessment Pain Score: 2  Pain Type: Chronic pain Pain Location: Hip Pain Orientation: Left Pain Descriptors / Indicators: Discomfort Pain Intervention(s): Distraction    Therapy/Group: Individual Therapy  CReginia NaasCCoyote Flats PVirgin[place]6/28/2023, 1:38 PM

## 2022-05-05 NOTE — Progress Notes (Signed)
Physical Therapy Session Note  Patient Details  Name: [name] MRN: [id number] Date of Birth: 1946-02-10  Today's Date: 05/05/2022 PT Individual Time: 1950-9326 PT Individual Time Calculation (min): 28 min   Short Term Goals: Week 1:  PT Short Term Goal 1 (Week 1): Pt will perform supine<>sit with supervision PT Short Term Goal 2 (Week 1): Pt will perform sit<>stands using LRAD with CGA PT Short Term Goal 3 (Week 1): Pt will perform bed<>chair transfers using LRAD with CGA PT Short Term Goal 4 (Week 1): Pt will ambulate at least 144f using LRAD with CGA PT Short Term Goal 5 (Week 1): Pt will participate in standardized outcome measure to assess fall risk  Skilled Therapeutic Interventions/Progress Updates:    Pt received sitting in recliner with her husband exiting upon therapist arrival and pt agreeable to therapy session. Pt reports she is feeling fatigued today. Sit>stand recliner>R UE support on narrow based quad cane (NBQC) with CGA for steadying.   Gait training ~2072fusing NBQC in RUE with CGA/light min assist for balance focusing on increasing gait speed and therapist facilitating L UE arm swing as pt holds it in flexed and internally rotated position - pt continues to have slight L LE circumduction with decreased hamstring activation during initial swing followed by lack of progression towards knee extension to achieve heel strike - continues to have slight "jumping" in L LE during swing possibly due to spasticity?  Gait training ~20030fsing NBQC in R UE with light min assist for balance while continuing fast walking but also performing dynamic challenges of R/L lateral side stepping requiring heavier min assist for this due to not taking a large enough step medially with L LE when stepping towards R.   Gait training back to room using RHHA and min assist for balance focusing on increasing gait speed - pt continues to have same L LE gait mechanics as described above, increased  instability, but improved trunk rotation and slight arm swing in L UE.   At end of session, pt left seated in recliner with needs in reach and seat belt alarm on.  Therapy Documentation Precautions:  Precautions Precautions: Fall, Other (comment) Precaution Comments: pt has personal L LE AFO Restrictions Weight Bearing Restrictions: No   Pain: Denies pain during session.    Therapy/Group: Individual Therapy  CarTawana ScalePT, DPT, NCS, CSRS 05/05/2022, 7:51 AM

## 2022-05-05 NOTE — Progress Notes (Signed)
Pt rested well throughout the night. Reports of pain to right neck at hs, however resolved with scheduled medications. No distress noted. VSS.

## 2022-05-05 NOTE — Patient Care Conference (Signed)
Inpatient RehabilitationTeam Conference and Plan of Care Update Date: 05/05/2022   Time: 10:39 AM    Patient Name: [name]      Medical Record Number: [id number]  Date of Birth: 04-Jun-1946 Sex: Female         Room/Bed: 4M03C/4M03C-01 Payor Info: Payor: MEDICARE / Plan: MEDICARE PART A AND B / Product Type: *No Product type* /    Admit Date/Time:  04/30/2022  4:11 PM  Primary Diagnosis:  SAH (subarachnoid hemorrhage) [facility])  Hospital Problems: Principal Problem:   SAH (subarachnoid hemorrhage) (HCC) Active Problems:   Subarachnoid hemorrhage [facility])    Expected Discharge Date: Expected Discharge Date: 05/14/22  Team Members Present: Physician leading conference: Dr. Alysia Penna Social Worker Present: Erlene Quan, BSW Nurse Present: Dorien Chihuahua, RN PT Present: Page Spiro, PT OT Present: Precious Haws, COTA;Jennifer August, OT SLP Present: Sherren Kerns, SLP PPS Coordinator present : Ileana Ladd, PT     Current Status/Progress Goal Weekly Team Focus  Bowel/Bladder   Continent of B/B LBM-6/27  Remain continent of B/B  Assist with toileting, assess B/B function every shift and prn.   Swallow/Nutrition/ Hydration             ADL's   CGA for bathing tasks ( sponge baths only- pt does not get in the shower), MIN A dressing, toileting CGA, MIN A for functional mobility with quad cane. continues to present with impaired proprioception in BUEs RUE worse LUE, tremors affected more on R side  supervision goals  BADL reeducation, family training, dynamic balance   Mobility   CGA bed mobility, min assist sit<>stand and stand pivot transfers using quad-cane, min assist gait up to 168f using quad-cane, min assist 2 steps using HR  supervision overall at ambulatory level transfers  activity tolerance, pt education, transfer training, dynamic gait training, L hemi NMR, stair navigation training, dynamic standing balance   Communication   mod I-to-sup A  mod I  high  level word finding   Safety/Cognition/ Behavioral Observations  sup-to-min A memory  sup A  functional recall with use of strategies   Pain   Reports pain to neck-3/10, relieved by tylenol or gabapentin  Pain level <3/10  Assess and address pain every shift and prn   Skin   Ecchymotic areas to arms/abdomen  Pt will not obtain any further areas of skin impairment  Assess and address skin every shift and prn     Discharge Planning:  Discharging home with spouse, no limitations. 24/7 supervision avaliable   Team Discussion: Patient post multi strokes; on lovenox bridge post MVR for epidural for hip pain. Insomnia addressed but continue to note hip pain and impaired fine motor control, memory deficits and word finding difficulties.  Patient on target to meet rehab goals: yes, currently needs CGA for sponge bathing. Needs mi assist for dressing lower body and CGA for toileting. Using a quad cane, transfers with min assist and completes steps with rails with min assist. Goals for discharge set for supervision overall.  *See Care Plan and progress notes for long and short-term goals.   Revisions to Treatment Plan:  Weighted utensil trials   Teaching Needs: Safety, medications, transfers, toileting, etc  Current Barriers to Discharge: Decreased caregiver support  Possible Resolutions to Barriers: Family education OP follow up services     Medical Summary Current Status: SAH causing new problems with fine motor as well as anomia, balance  Barriers to Discharge: Medical stability;Other (comments)  Barriers to Discharge Comments:  anticoagulation for mechanical valve     Continued Need for Acute Rehabilitation Level of Care: The patient requires daily medical management by a physician with specialized training in physical medicine and rehabilitation for the following reasons: Direction of a multidisciplinary physical rehabilitation program to maximize functional independence :  Yes Medical management of patient stability for increased activity during participation in an intensive rehabilitation regime.: Yes Analysis of laboratory values and/or radiology reports with any subsequent need for medication adjustment and/or medical intervention. : Yes   I attest that I was present, lead the team conference, and concur with the assessment and plan of the team.   Margarito Liner 05/05/2022, 5:36 PM

## 2022-05-05 NOTE — Plan of Care (Signed)
  Problem: Consults Goal: RH STROKE PATIENT EDUCATION Description: See Patient Education module for education specifics  Outcome: Progressing   Problem: RH BOWEL ELIMINATION Goal: RH STG MANAGE BOWEL WITH ASSISTANCE Description: STG Manage Bowel with mod I Assistance. Outcome: Progressing Goal: RH STG MANAGE BOWEL W/MEDICATION W/ASSISTANCE Description: STG Manage Bowel with Medication with mod I Assistance. Outcome: Progressing   Problem: RH BOWEL ELIMINATION Goal: RH STG MANAGE BOWEL W/MEDICATION W/ASSISTANCE Description: STG Manage Bowel with Medication with mod I Assistance. Outcome: Progressing   Problem: RH BLADDER ELIMINATION Goal: RH STG MANAGE BLADDER WITH ASSISTANCE Description: STG Manage Bladder With toileting Assistance Outcome: Progressing   Problem: RH SAFETY Goal: RH STG ADHERE TO SAFETY PRECAUTIONS W/ASSISTANCE/DEVICE Description: STG Adhere to Safety Precautions With cues Assistance/Device. Outcome: Progressing

## 2022-05-05 NOTE — Progress Notes (Signed)
Speech Language Pathology Daily Session Note  Patient Details  Name: [name] MRN: [id number] Date of Birth: 06-07-1946  Today's Date: 05/05/2022 SLP Individual Time: 0932-1000 SLP Individual Time Calculation (min): 28 min  Short Term Goals: Week 1: SLP Short Term Goal 1 (Week 1): Pt will use compensatory word finding strategies at the conversational level with supervision verbal cues. SLP Short Term Goal 2 (Week 1): Pt will recognize and correct written errors at the sentence level with supervision verbal cues. SLP Short Term Goal 3 (Week 1): Pt will utilize comensatory memory strategies to recall daily, functional information with min assist.  Skilled Therapeutic Interventions: Skilled ST treatment focused on cognitive goals. SLP facilitated session by providing education and use of internal memory strategies including repetition, association, chunking during working memory task on [employer]. Without implementation of strategies, pt recalled up to 5 words in sequential order with 60% accuracy. With min A for use of strategies, pt recalled up to 6 words with 90% accuracy. Reviewed progress and SLP POC with pt's spouse at end of session. Patient was left in wheelchair with alarm activated and immediate needs within reach at end of session. Continue per current plan of care.      Pain Pain Assessment Pain Scale: 0-10 Pain Score: 0-No pain  Therapy/Group: Individual Therapy  Rechy Bost T Kristell Wooding 05/05/2022, 10:06 AM

## 2022-05-06 LAB — PROTIME-INR
INR: 2 — ABNORMAL HIGH (ref 0.8–1.2)
Prothrombin Time: 22.1 seconds — ABNORMAL HIGH (ref 11.4–15.2)

## 2022-05-06 MED ORDER — METHOCARBAMOL 500 MG PO TABS
500.0000 mg | ORAL_TABLET | Freq: Three times a day (TID) | ORAL | Status: DC | PRN
  Administered 2022-05-06 – 2022-05-10 (×8): 500 mg via ORAL
  Filled 2022-05-06 (×10): qty 1

## 2022-05-06 NOTE — Progress Notes (Signed)
Physical Therapy Session Note  Patient Details  Name: [name] MRN: [id number] Date of Birth: [birth year]  Today's Date: 05/06/2022 PT Individual Time: 5027-7412 and 8786-7672 and 0947-0962 PT Individual Time Calculation (min): 80 min  and 28 min and 44 min   Short Term Goals: Week 1:  PT Short Term Goal 1 (Week 1): Pt will perform supine<>sit with supervision PT Short Term Goal 2 (Week 1): Pt will perform sit<>stands using LRAD with CGA PT Short Term Goal 3 (Week 1): Pt will perform bed<>chair transfers using LRAD with CGA PT Short Term Goal 4 (Week 1): Pt will ambulate at least 158f using LRAD with CGA PT Short Term Goal 5 (Week 1): Pt will participate in standardized outcome measure to assess fall risk  Skilled Therapeutic Interventions/Progress Updates:    Session 1: Pt received sitting in recliner and agreeable to therapy session. Pt already wearing R LE AFO. Pt reports need to use bathroom. Sit>stand recliner>R UE support on narrow based quad cane (NBQC) with CGA for steadying. Gait in/out bathroom using NBQC with CGA for steadying - continues to have very slow gait speed in this smaller environment - cuing for pt to perform all activities such as opening/closing door and turning lights on/off - encouragement to use L UE for NMR.  Standing using R UE support on grab bar as needed with CGA for steadying/safety - pt completed LB clothing management without assist. Sit<>stand to/from toilet using R UE support on grab bar with CGA. Pt continent of bladder and performed seated peri-care without assist.   When ambulating back to room talked about washing hands but pt forgets this and goes to sit in recliner, then once turned to sink pt starts to reach for her toothbrush with therapist having to again cue pt of plan to wash her hands - pt aware that she had forgotten the plan and states her short term recall has been affected by her recent CVA.  Standing at sink with CGA/close supervision  performed hand hygiene and then oral care.   Pt talks about being active prior to hospital stay and going to gym to walk the indoor track for 468mutes at least 2x/week.  Gait training ~225103fo day room using NBQC with CGA for steadying - gait speed 0.81m63mo 0.61m/58merground indicating household ambulator - continues to have decreased L LE knee flexion during swing and slower L LE swing advancement with slight "jerking' movement during swing (due to spasticity?).  When turning to sit in chair on pt's R she had only a slight R stumble/LOB requiring no more than CGA to maintain upright but with sudden onset of "jerk" pain in R low back - pt reports it dulls to a "throb" with seated break then to only a "twing" then to a "push" feeling  - therapist stopped activity and provided seated rest break, then continued to stop activity if pain occurred again during session - seems that the pain happens primarily when pt going to sit down.   Donned litegait harness.  Stepped on/off treadmill using R UE support on handrail with cuing for sequencing and min assist for balance.  Gait training on litegait using B UE support on handrails with harness for safety but not providing BWS:  - 4min 45msec at 0.7mph i67meased to 0.9mph to43ming 313ft - t13fpist providing visual target and verbal cuing for increased step lengths bilaterally (L>R) with improvement noticed but requires continued cuing to sustain  - 5min 39se61mt 0.8mph total70m  348f with therapist providing bean bag targets to step over intermittently focusing on increased L knee flexion for improved foot clearance as well as increase step length progressed to ambulating at 2 incline for ~30 seconds    Doffed harness.  Gait back to room using R NBQC with pt demonstrating improving gait speed and increased B LE step lengths with CGA/light min assist for steadying.  Upon returning to room pt reports severe fatigue and requests to lie back down. Vitals  assessed: BP 116/86 (MAP 97), HR 104bpm   Pt again has R low back pain when going from sitting to lying down. Nurse made aware and pt left supine in bed with needs in reach and bed alarm on.   Session 2: Pt received supine in bed with her husband, BMikki Santee present and pt agreeable to therapy session. Pt reporting she is still experiencing R lower back pain stating it "comes and goes" having difficulty further clarifying when it happens. Therapist suggested having pt's husband be trained in how to safely assist her to/from bathroom to allow family to start hands-on training and provide pt more opportunities to be mobile during the day. Pt/husband in agreement.   Therapist educated pt's husband on safe set-up of the room and proper use of gait belt.  Pt performed supine>sitting R EOB, HOB partially elevated and using bedrail, with CGA and pt having onset of R lower back pain while coming to sit EOB but able to reposition herself and take a seated rest break with breathing for pain relief.   Sit>stand EOB>R NBQC with pt's husband providing proper CGA/light min assist via use of gait belt - pt requires therapist to elevated bed to allow her to come into standing more independently due to fatigue.  Gait in/out bathroom using R NBQC with pt's husband providing light min assist and therapist providing cuing/education on safe positioning and monitoring pt's balance with pt's husband demonstrating understanding. Pt performed LB clothing management with CGA for steadying balance and using R UE support on grab bar as needed - therapist cuing pt's husband on ensuring he properly guards her during this. Sit<>stand to/from toilet using R UE support on grabbar with pt's husband providing light min assist - pt denies R low back pain when going to sit but says has some when coming back into standing. Continent of bladder and performed peri-care with set-up for washcloth.  Pt sitting in recliner at end of session and  therapist provided pt with hot pack to place on R lower back for pain management. Pt reports that at this time she would feel most safe if nursing staff continued to help her with toileting as opposed to only her husband assisting her. Pt left seated in recliner with needs in reach and seat belt alarm on.   Session 3: Pt received supine in the bed appearing distressed and upon questioning pt states she doesn't know what is wrong and pt has difficulty trying to explain how she is feeling despite therapist attempting to ask clarifying questions. Pt agreeable to therapy session. She states she is still experiencing the R lower back pain and that it really hurt to move from the recliner to lying down in the bed. Pt agreeable to attempt OOB mobility but as soon as she started to flex up R hip to reposition her LE she experienced sudden onset of that R lower back pain causing her to tense up - appearing to be a sudden, sharp, grabbing type pain because pt says  it dissipates when she lies still and relaxes. Pt also reports she has the pain when coughing. Educated pt on use of soft tissue mobilization for musculoskeletal pain management. Cued pt to roll onto L side via logroll technique for pain management and therapist providing mod assist via bed pad. Performed ~86mnutes of R lower paraspinal and upper gluteal soft tissue mobilization with pt reporting her pain appears to be located near the quadratus lumborum. Pt's skin intact with no areas of redness.   Therapist attempts to provide cuing for pt to perform logroll technique to minimize spinal rotation to progress towards EOB but pt continues to have sudden onset of the pain therefore therapist provided dependent assist for pt to roll into R sidelying using bed pads and then max assist to go from sidelying to sitting EOB, but pt again has sudden onset of R lower back pain causing her to tense up and despite therapist attempt to provide support in sitting for pt to  relax she is unable and requests to return to lying in the bed therefore provided max assist for trunk descent and B LE management onto bed. Notified nurse and informed her of pt's pain experience to report to PA. Therapist assisted with positioning pt into supine position and pt reporting no pain at this time. Pt left supine with needs in reach and bed alarm on.  Therapy Documentation Precautions:  Precautions Precautions: Fall, Other (comment) Precaution Comments: pt has personal L LE AFO Restrictions Weight Bearing Restrictions: No   Pain:  Session 1: R low back pain - please see above.  Session 2: See above.   Session 3: See above - R lower back pain.  Therapy/Group: Individual Therapy  CTawana Scale, PT, DPT, NCS, CSRS 05/06/2022, 7:58 AM

## 2022-05-06 NOTE — Evaluation (Signed)
Recreational Therapy Assessment and Plan  Patient Details  Name: [name] MRN: [id number] Date of Birth: [birth year] Today's Date: 05/06/2022  Rehab Potential:  Good ELOS:   d/c 7/7  Assessment  Hospital Problem: Principal Problem:   SAH (subarachnoid hemorrhage) ([place]) Active Problems:   Subarachnoid hemorrhage ([place])     Past Medical History:      Past Medical History:  Diagnosis Date   Abnormality of gait 09/07/2016   Allergy     Diabetes mellitus without complication ([place])      Patient denies this - notes history of glucose intolerance   GERD (gastroesophageal reflux disease)     Hemiparesis and alteration of sensations as late effects of stroke ([place]) 09/07/2016   History of pneumonia 1997   Hypertension     S/P MVR (mitral valve replacement)      Mechanical mitral valve replacement at age 44 (done in [place])  // echo 7/17: EF 55-60, normal wall motion, bileaflet mechanical mitral valve prosthesis functioning normally, mild LAE, mildly reduced RVSF, small pericardial effusion   Stroke ([place]) 1997, 2013, 2015    Past Surgical History:       Past Surgical History:  Procedure Laterality Date   ABDOMINAL HYSTERECTOMY       [place]   TOE SURGERY   1996      Assessment & Plan Clinical Impression: Patient is a 76 y.o. 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 Alysia Penna in the outpatient office.  Per chart review patient lives with spouse.  Two-level home bed and bath main level one-step to entry.  Uses a quad cane in the home, generally  modified independent with mobility.  Presented 04/26/2022 with acute onset of slurred speech as well as altered mental status.  Family denied any recent trauma or fall.  Patient does have a history of chronic hip pain was scheduled to have injection 04/29/2022 thus her Coumadin had been held since 04/23/2022 and she had been placed on Lovenox bridge injections 1 mg/kilogram twice daily administered by her husband..  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  Chronic infarcts within the left thalamus and bilateral cerebellar hemispheres.  CT angiogram of head and neck no hemodynamically significant stenosis occlusion dissection or aneurysm.  Admission chemistries unremarkable aside glucose 155 BUN 26, hemoglobin A1c 6.1, ammonia level within normal limits, urinalysis negative nitrite, urine drug screen negative.  Hospital course discussed with neurosurgery/Dr. Annette Stable with conservative care and repeat cranial CT scan 04/27/2022 again showing moderate amount of subarachnoid hemorrhage in left parietal cortex with slight interval increase.  Small amount of subarachnoid hemorrhage in the right parietal cortex not changed.  Plan on resuming Coumadin with Lovenox bridge 04/29/2022.  Patient tolerating a regular diet.  Therapy evaluations completed due to patient decreased functional mobility and slurred speech was admitted for a comprehensive rehab program.  Patient transferred to CIR on 04/30/2022 .    Pt presents with decreased activity tolerance, decreased functional mobility, decreased balance, decreased coordination,  decreased attention, decreased awareness, decreased problem solving, decreased safety awareness, and delayed processing Limiting pt's independence with leisure/community pursuits.  Met with pt today to discuss TR services including  leisure education, activity analysis/modifications and stress management.  Also discussed the importance of social, emotional, spiritual health in addition to physical health and their effects on overall health and wellness.  Pt stated understanding.   Plan  Min 1 TR session >20 minutes during LOS  Recommendations for other services: None   Discharge Criteria: Patient will be discharged from TR if patient refuses treatment 3 consecutive times without medical reason.  If treatment goals not met, if there is a change in medical status, if patient makes no progress towards goals or if patient is discharged from hospital.  The above assessment, treatment plan, treatment alternatives and goals were discussed and mutually agreed upon: by patient  [place] 05/06/2022, 12:51 PM

## 2022-05-06 NOTE — Progress Notes (Signed)
PROGRESS NOTE   Subjective/Complaints:  Discussed D/C date  ROS- neg CP, SOB, N/V/D, fever, chills Objective:   No results found. No results for input(s): "WBC", "HGB", "HCT", "PLT" in the last 72 hours.  Recent Labs    05/04/22 0610  NA 142  K 4.1  CL 106  CO2 29  GLUCOSE 122*  BUN 20  CREATININE 0.87  CALCIUM 9.7     Intake/Output Summary (Last 24 hours) at 05/06/2022 0710 Last data filed at 05/05/2022 2115 Gross per 24 hour  Intake 420 ml  Output --  Net 420 ml         Physical Exam: Vital Signs Blood pressure 128/78, pulse 78, temperature 97.9 F (36.6 C), resp. rate 15, height '5\' 7"'$  (1.702 m), weight 55 kg, SpO2 98 %.  General: No acute distress Mood and affect are appropriate Heart: Regular rate and rhythm no rubs murmurs or extra sounds Lungs: Clear to auscultation, breathing unlabored, no rales or wheezes Abdomen: Positive bowel sounds, soft nontender to palpation, nondistended Extremities: No clubbing, cyanosis, or edema Skin: No evidence of breakdown, no evidence of rash, warm and dry Neurologic: Cranial nerves II through XII intact, motor strength is 4/5 in bilateral deltoid, bicep, tricep, grip, hip flexor, knee extensors, ankle dorsiflexor and plantar flexor Sensory exam normal sensation to light touch lower extremities Speech min dysarthria , sentence level , occ word finding def, but appears at baseline  Musculoskeletal: Full range of motion in all 4 extremities. No joint swelling    Assessment/Plan: 1. Functional deficits which require 3+ hours per day of interdisciplinary therapy in a comprehensive inpatient rehab setting. Physiatrist is providing close team supervision and 24 hour management of active medical problems listed below. Physiatrist and rehab team continue to assess barriers to discharge/monitor patient progress toward functional and medical goals  Care Tool:  Bathing     Body parts bathed by patient: Right arm, Left arm, Chest, Abdomen, Front perineal area, Buttocks, Right upper leg, Left upper leg, Face   Body parts bathed by helper: Buttocks     Bathing assist Assist Level: Contact Guard/Touching assist     Upper Body Dressing/Undressing Upper body dressing   What is the patient wearing?: Pull over shirt    Upper body assist Assist Level: Minimal Assistance - Patient > 75%    Lower Body Dressing/Undressing Lower body dressing      What is the patient wearing?: Pants, Underwear/pull up     Lower body assist Assist for lower body dressing: Minimal Assistance - Patient > 75%     Toileting Toileting Toileting Activity did not occur (Clothing management and hygiene only): N/A (no void or bm)  Toileting assist Assist for toileting: Contact Guard/Touching assist     Transfers Chair/bed transfer  Transfers assist     Chair/bed transfer assist level: Contact Guard/Touching assist Chair/bed transfer assistive device: Research officer, political party   Ambulation assist      Assist level: Contact Guard/Touching assist Assistive device: Cane-quad Max distance: 120   Walk 10 feet activity   Assist     Assist level: Contact Guard/Touching assist Assistive device: Cane-quad   Walk 50 feet activity  Assist    Assist level: Contact Guard/Touching assist Assistive device: Cane-quad    Walk 150 feet activity   Assist Walk 150 feet activity did not occur: Safety/medical concerns         Walk 10 feet on uneven surface  activity   Assist     Assist level: Contact Guard/Touching assist Assistive device: Cane-quad   Wheelchair     Assist Is the patient using a wheelchair?: No (only for time management due to slow gait speed)             Wheelchair 50 feet with 2 turns activity    Assist            Wheelchair 150 feet activity     Assist          Blood pressure 128/78, pulse 78,  temperature 97.9 F (36.6 C), resp. rate 15, height '5\' 7"'$  (1.702 m), weight 55 kg, SpO2 98 %.  Medical Problem List and Plan: 1. Functional deficits secondary to [facility] secondary to anticoagulation as well as history of CVA x2 with residual left-sided weakness/brain surgery with craniotomy             -patient may shower             -ELOS/Goals: 7-9 days.               -Continue CIR PT,OT,SLP 2.  Antithrombotics: -DVT/anticoagulation: Coumadin resumed 04/29/2022 with bridging of Lovenox.  INR goal 2.5-3.5, INR still low , cont lovenox 62.'5mg'$  q12h INR 2.0              -antiplatelet therapy: N/A 3. Pain: continue Neurontin 100 mg nightly 4. Mood/Sleep: Provide emotional support             -antipsychotic agents: N/A 5. Neuropsych/cognition: This patient is capable of making decisions on her own behalf. 6. Skin/Wound Care: Routine skin checks 7. Fluids/Electrolytes/Nutrition: Routine in and outs with follow-up chemistries 8.  History of mitral valve replacement 1997.  Resuming Coumadin with bridging of Lovenox 04/29/2022.  Follow-up per cardiology services as needed 9.  Hyperlipidemia.  Continue Lipitor 10.  Hypertension.  Continue Norvasc 5 mg daily Vitals:   05/05/22 2005 05/06/22 0406  BP: 103/67 128/78  Pulse: 87 78  Resp: 16 15  Temp: 98.3 F (36.8 C) 97.9 F (36.6 C)  SpO2: 98% 98%   Controlled 05/06/22 11.  Prediabetes.  Hemoglobin A1c 6.1.  Diet controlled/SSI discontinued 12.  History of lumbar spinal stenosis  with increased radicular pain , would not rec reschedule  ESI due to anticoag issues  13.  GERD.  Protonix 14. Insomnia She plans to find out what medication she takes at home for consideration of use at the hospital 15. Hypokalemia  -KCL 43mq x2 , recheck  16. Anemia mild  -Continue to monitor    LOS: 6 days A FACE TO FACE EVALUATION WAS PERFORMED  ACharlett Blake6/29/2023, 7:10 AM

## 2022-05-06 NOTE — Progress Notes (Signed)
Patient having 8/10 pain with movement unable to sit upright in the bed. PA dan made aware new order for PRN robaxin '500mg'$  Q8hours. Patient requesting to have purwick for tonight due to the pain with movement and worrying about making it to the bathroom. Spoke with Genene Churn AD okd to give patient purwick in place tonight.   Dayna Ramus

## 2022-05-06 NOTE — Progress Notes (Signed)
[facility] for warfarin w/ Lovenox to bridge Indication:  mechanical mitral valve  Allergies  Allergen Reactions   Zoloft [Sertraline] Hives, Itching and Rash    Patient Measurements: Height: '5\' 7"'$  (170.2 cm) Weight: 55 kg (121 lb 4.1 oz) IBW/kg (Calculated) : 61.6  Vital Signs: Temp: 97.9 F (36.6 C) (06/29 0406) BP: 128/78 (06/29 0406) Pulse Rate: 78 (06/29 0406)  Labs: Recent Labs    05/04/22 0610 05/05/22 0515 05/06/22 0512  LABPROT 17.3* 19.6* 22.1*  INR 1.4* 1.7* 2.0*  CREATININE 0.87  --   --      Estimated Creatinine Clearance: 48.5 mL/min (by C-G formula based on SCr of 0.87 mg/dL).  Assessment: 89 YOF who presented to the ED with confusion and slurred speech. 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 She has a PMH positive for mMVR in 1997. Lovenox / warfarin from PTA was held on admission and resumed 6/22. Pharmacy consulted to manage Lovenox / warfarin during rehab admission.  INR slowly trending up   Goal of Therapy:  Anti-Xa level 0.6-1 units/ml 4hrs after LMWH dose given INR 2.5-3.5 Monitor platelets by anticoagulation protocol: Yes   Plan:  Continue Lovenox 62.5 mg SQ q12h Warfarin as PTA doses: 5 mg PO daily except 7.5 mg on Mondays INR daily, CBC on Mon DC Lovenox when INR is >/=2.5 X2 measurements in a row Monitor for signs of bleeding  Thank you Anette Guarneri, PharmD  05/06/2022 10:59 AM  **Pharmacist phone directory can be found on [email or web address] listed under [place]**

## 2022-05-06 NOTE — Progress Notes (Signed)
Occupational Therapy Session Note  Patient Details  Name: [name] MRN: [id number] Date of Birth: 1946/02/14  Today's Date: 05/06/2022 OT Individual Time: 1105-1200 OT Individual Time Calculation (min): 55 min    Short Term Goals: Week 1:  OT Short Term Goal 1 (Week 1): Pt to be CGA for functional transfers with LRD OT Short Term Goal 2 (Week 1): Pt to be Min A for LB dressing/bathing with LRD OT Short Term Goal 3 (Week 1): Pt to be Min A for toileting tasks with LRD OT Short Term Goal 4 (Week 1): Pt to demonstrate increased coordination to the R UE during functional tasks  Skilled Therapeutic Interventions/Progress Updates:  Pt greeted supien in bed reporting feeling very cold. Pt recalls having back pain earlier in PT session but reports pain is mostly gone, however pt declined OOB mobility.   Session focus on education related to AE as pt with previous questions about available options. Extensive discussion about AE such as weighted mug with 2 handles, weighted utensils, weighted pen and hair washing tray for home. Provided example/ demo of each items and made pt a list of each item and where to purchase. Both husband and pt very appreciative.  Discussed home routine with pt asking about compensatory methods for laundry as pt reports difficulty managing heavy laundry detergent. Suggested pt use Pods to throw in washer as energy conservation strategy.  Pt was able to ambulate to bathroom with quad cane and CGA, CGA for 3/3 toileting tasks. Pt left supine in bed with bed alarm activated and all needs within reach.                    Therapy Documentation Precautions:  Precautions Precautions: Fall, Other (comment) Precaution Comments: pt has personal L LE AFO Restrictions Weight Bearing Restrictions: No  Pain: mild back pain reported however no intervention needed.     Therapy/Group: Individual Therapy  Precious Haws 05/06/2022, 12:21 PM

## 2022-05-07 LAB — PROTIME-INR
INR: 2.2 — ABNORMAL HIGH (ref 0.8–1.2)
Prothrombin Time: 24.3 seconds — ABNORMAL HIGH (ref 11.4–15.2)

## 2022-05-07 MED ORDER — TRAMADOL HCL 50 MG PO TABS
50.0000 mg | ORAL_TABLET | Freq: Four times a day (QID) | ORAL | Status: DC | PRN
  Administered 2022-05-09 – 2022-05-10 (×2): 50 mg via ORAL
  Filled 2022-05-07 (×2): qty 1

## 2022-05-07 MED ORDER — TROLAMINE SALICYLATE 10 % EX CREA: TOPICAL_CREAM | Freq: Two times a day (BID) | CUTANEOUS | Status: DC | PRN

## 2022-05-07 MED ORDER — MUSCLE RUB 10-15 % EX CREA
TOPICAL_CREAM | Freq: Two times a day (BID) | CUTANEOUS | Status: DC | PRN
  Filled 2022-05-07 (×2): qty 85

## 2022-05-07 NOTE — Progress Notes (Signed)
Speech Language Pathology Daily Session Note  Patient Details  Name: [name] MRN: [id number] Date of Birth: 01-18-1946  Today's Date: 05/07/2022 SLP Individual Time: 1300-1330 SLP Individual Time Calculation (min): 30 min and Today's Date: 05/07/2022 SLP Missed Time: 15 Minutes Missed Time Reason: Pain  Short Term Goals: Week 1: SLP Short Term Goal 1 (Week 1): Pt will use compensatory word finding strategies at the conversational level with supervision verbal cues. SLP Short Term Goal 2 (Week 1): Pt will recognize and correct written errors at the sentence level with supervision verbal cues. SLP Short Term Goal 3 (Week 1): Pt will utilize comensatory memory strategies to recall daily, functional information with min assist.  Skilled Therapeutic Interventions: Skilled ST treatment focused on cognitive goals. Pt was supine in bed on arrival and reported not having a very good day due to increased pain and muscle spasms. Pt exhibited spasms approximately every 1-2 minutes. RN is aware and notified regarding request to receive pain medication when able. Pt reported having difficulty remembering when she requested for pain medication, therefore SLP provided education and instruction on use of external aid for functional recall. SLP wrote on dry erase board when last pain medication was given and problem solved when next dose may be given with mod I. Pt utilized external aid with mod I. Due to increased discomfort, pt requested to conclude session 15 minutes early. Will attempt to see patient as schedule allows. Patient was left in bed with alarm activated and immediate needs within reach at end of session. Continue per current plan of care.      Pain Pain Assessment Pain Scale: 0-10 Pain Score: 7  Pain Location: Back Pain Orientation: Right Pain Descriptors / Indicators: Spasm Pain Onset: On-going Pain Intervention(s): RN made aware;Emotional support  Therapy/Group: Individual  Therapy  Patty Sermons 05/07/2022, 3:43 PM

## 2022-05-07 NOTE — Progress Notes (Addendum)
[place] for warfarin w/ Lovenox to bridge Indication:  mechanical mitral valve  Allergies  Allergen Reactions   Zoloft [Sertraline] Hives, Itching and Rash    Patient Measurements: Height: '5\' 7"'$  (170.2 cm) Weight: 55 kg (121 lb 4.1 oz) IBW/kg (Calculated) : 61.6  Vital Signs: Temp: 98.7 F (37.1 C) (06/30 0252) BP: 129/71 (06/30 0252) Pulse Rate: 103 (06/30 0252)  Labs: Recent Labs    05/05/22 0515 05/06/22 0512 05/07/22 0508  LABPROT 19.6* 22.1* 24.3*  INR 1.7* 2.0* 2.2*     Estimated Creatinine Clearance: 48.5 mL/min (by C-G formula based on SCr of 0.87 mg/dL).  Assessment: 60 YOF who presented to the ED with confusion and slurred speech. 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 She has a PMH positive for mMVR in 1997. Lovenox / warfarin from PTA was held on admission and resumed 6/22. Pharmacy consulted to manage Lovenox / warfarin during rehab admission.  INR slowly trending up -> INR 2.2  Goal of Therapy:  Anti-Xa level 0.6-1 units/ml 4hrs after LMWH dose given INR 2.5-3.5 Monitor platelets by anticoagulation protocol: Yes   Plan:  Continue Lovenox 62.5 mg SQ q12h Warfarin as PTA doses: 5 mg PO daily except 7.5 mg on Mondays INR daily, CBC on Mon DC Lovenox when INR is >/=2.5 X2 measurements in a row Monitor for signs of bleeding  Thank you Anette Guarneri, PharmD  05/07/2022 10:40 AM  **Pharmacist phone directory can be found on [email or web address] listed under [place]**

## 2022-05-07 NOTE — Progress Notes (Addendum)
Occupational Therapy Session Note  Patient Details  Name: [name] MRN: [id number] Date of Birth: 1946/10/01  Today's Date: 05/07/2022 OT Individual Time: 1275-1700 OT Individual Time Calculation (min): 38 min    Short Term Goals: Week 1:  OT Short Term Goal 1 (Week 1): Pt to be CGA for functional transfers with LRD OT Short Term Goal 2 (Week 1): Pt to be Min A for LB dressing/bathing with LRD OT Short Term Goal 3 (Week 1): Pt to be Min A for toileting tasks with LRD OT Short Term Goal 4 (Week 1): Pt to demonstrate increased coordination to the R UE during functional tasks  Skilled Therapeutic Interventions/Progress Updates:  Pt greeted supine in bed reporting 10/10 pain in back. Pt declining any OOB mobility d/t pain, offered bed level ADLs with pt declining. Heat applied to pts back for pain mgmt and discussed various pain mgmt strategies such as deep breathing. Pt had not eaten breakfast, set pt up with applesauce with pt abel to self feed a couple bites with RUE. Provided suggestions for toileting today as pt reports she doesn't feel like she could walk to the bathroom, suggested BSC be pulled up to EOB however pt preferring purewick. LPN to order K pad for pain mgmt.  Pt planned for group session later today however feel pt may not tolerate sitting up in w/c for group session therefore briefly reviewed contents of group session as indicated below:   provided coping strategies to manage new diagnosis to allow for improved mental health to increase overall quality of life . Discussed how to break down stressors into "daily hassles," "major life stressors" and "life circumstances" in an effort to allow pts to chunk their stressors into groups and determine where to best put their efforts/time when dealing with stress. Pt actively sharing stressors. Provided active listening, emotional support and therapeutic use of self. Offered education on factors that protect [place] against stress such as  "daily uplifts," "healthy coping strategies" and "protective factors." education provided on sharing this information with their caregivers to facilitate improved caregiver communication and decrease overall burden of care.  Issued pt handouts on healthy coping strategies to implement into routine. Pt left supine in bed with LPN present.   Therapy Documentation Precautions:  Precautions Precautions: Fall, Other (comment) Precaution Comments: pt has personal L LE AFO Restrictions Weight Bearing Restrictions: No   Therapy/Group: Individual Therapy  Precious Haws 05/07/2022, 12:20 PM

## 2022-05-07 NOTE — Progress Notes (Signed)
PROGRESS NOTE   Subjective/Complaints:  Pain after ambulating on treadmill.  Low back spasms no sciatic pain, no new numbness or weaknesss in LEs  ROS- neg CP, SOB, N/V/D, fever, chills Objective:   No results found. No results for input(s): "WBC", "HGB", "HCT", "PLT" in the last 72 hours.  No results for input(s): "NA", "K", "CL", "CO2", "GLUCOSE", "BUN", "CREATININE", "CALCIUM" in the last 72 hours.   Intake/Output Summary (Last 24 hours) at 05/07/2022 0754 Last data filed at 05/06/2022 2257 Gross per 24 hour  Intake 120 ml  Output 450 ml  Net -330 ml         Physical Exam: Vital Signs Blood pressure 129/71, pulse (!) 103, temperature 98.7 F (37.1 C), resp. rate 18, height '5\' 7"'$  (1.702 m), weight 55 kg, SpO2 95 %.  General: No acute distress Mood and affect are appropriate Heart: Regular rate and rhythm no rubs murmurs or extra sounds Lungs: Clear to auscultation, breathing unlabored, no rales or wheezes Abdomen: Positive bowel sounds, soft nontender to palpation, nondistended Extremities: No clubbing, cyanosis, or edema Skin: No evidence of breakdown, no evidence of rash, warm and dry Neurologic: Cranial nerves II through XII intact, motor strength is 4/5 in bilateral deltoid, bicep, tricep, grip, hip flexor, knee extensors, ankle dorsiflexor and plantar flexor Sensory exam normal sensation to light touch lower extremities Speech min dysarthria , sentence level , occ word finding def, but appears at baseline  Musculoskeletal:Pain in lumbar area     Assessment/Plan: 1. Functional deficits which require 3+ hours per day of interdisciplinary therapy in a comprehensive inpatient rehab setting. Physiatrist is providing close team supervision and 24 hour management of active medical problems listed below. Physiatrist and rehab team continue to assess barriers to discharge/monitor patient progress toward functional  and medical goals  Care Tool:  Bathing    Body parts bathed by patient: Right arm, Left arm, Chest, Abdomen, Front perineal area, Buttocks, Right upper leg, Left upper leg, Face   Body parts bathed by helper: Buttocks     Bathing assist Assist Level: Contact Guard/Touching assist     Upper Body Dressing/Undressing Upper body dressing   What is the patient wearing?: Pull over shirt    Upper body assist Assist Level: Minimal Assistance - Patient > 75%    Lower Body Dressing/Undressing Lower body dressing      What is the patient wearing?: Pants, Underwear/pull up     Lower body assist Assist for lower body dressing: Minimal Assistance - Patient > 75%     Toileting Toileting Toileting Activity did not occur (Clothing management and hygiene only): N/A (no void or bm)  Toileting assist Assist for toileting: Contact Guard/Touching assist     Transfers Chair/bed transfer  Transfers assist     Chair/bed transfer assist level: Contact Guard/Touching assist Chair/bed transfer assistive device: Armrests, Cane   Locomotion Ambulation   Ambulation assist      Assist level: Contact Guard/Touching assist Assistive device: Cane-quad Max distance: 238f   Walk 10 feet activity   Assist     Assist level: Contact Guard/Touching assist Assistive device: Cane-quad   Walk 50 feet activity   Assist  Assist level: Contact Guard/Touching assist Assistive device: Cane-quad    Walk 150 feet activity   Assist Walk 150 feet activity did not occur: Safety/medical concerns         Walk 10 feet on uneven surface  activity   Assist     Assist level: Contact Guard/Touching assist Assistive device: Cane-quad   Wheelchair     Assist Is the patient using a wheelchair?: No (only for time management due to slow gait speed)             Wheelchair 50 feet with 2 turns activity    Assist            Wheelchair 150 feet activity      Assist          Blood pressure 129/71, pulse (!) 103, temperature 98.7 F (37.1 C), resp. rate 18, height '5\' 7"'$  (1.702 m), weight 55 kg, SpO2 95 %.  Medical Problem List and Plan: 1. 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               -Continue CIR PT,OT,SLP 2.  Antithrombotics: -DVT/anticoagulation: Coumadin resumed 04/29/2022 with bridging of Lovenox.  INR goal 2.5-3.5, INR still low , cont lovenox 62.'5mg'$  q12h INR 2.2              -antiplatelet therapy: N/A 3. Pain: continue Neurontin 100 mg nightly, RObaxin Add tramadol, sportscreme and heat  4. Mood/Sleep: Provide emotional support             -antipsychotic agents: N/A 5. Neuropsych/cognition: This patient is capable of making decisions on her own behalf. 6. Skin/Wound Care: Routine skin checks 7. Fluids/Electrolytes/Nutrition: Routine in and outs with follow-up chemistries 8.  History of mitral valve replacement 1997.  Resuming Coumadin with bridging of Lovenox 04/29/2022.  Follow-up per cardiology services as needed 9.  Hyperlipidemia.  Continue Lipitor 10.  Hypertension.  Continue Norvasc 5 mg daily Vitals:   05/06/22 2013 05/07/22 0252  BP: 120/76 129/71  Pulse: 99 (!) 103  Resp: 16 18  Temp: 98.7 F (37.1 C) 98.7 F (37.1 C)  SpO2: 95% 95%   Controlled 05/07/22 11.  Prediabetes.  Hemoglobin A1c 6.1.  Diet controlled/SSI discontinued 12.  History of lumbar spinal stenosis  with increased radicular pain , would not rec reschedule  ESI due to anticoag issues - would rec no treadmill training just functional ambulation  13.  GERD.  Protonix 14. Insomnia She plans to find out what medication she takes at home for consideration of use at the hospital 15. Hypokalemia  -KCL 8mq x2 , recheck  16. Anemia mild  -Continue to  monitor    LOS: 7 days A FACE TO FACE EVALUATION WAS PERFORMED  ACharlett Blake6/30/2023, 7:54 AM

## 2022-05-07 NOTE — Progress Notes (Signed)
Occupational Therapy Note  Patient Details  Name: DEBRIA BROECKER MRN: [id number] Date of Birth: Jul 16, 1946  Today's Date: 05/07/2022 OT Missed Time: 23 Minutes Missed Time Reason: Pain( pt in 10/10 pain in back, unable to tolerate sitting up for group).  Will f/u as time allows to make up for missed minutes.    Corinne Ports [facility] 05/07/2022, 12:31 PM

## 2022-05-07 NOTE — Plan of Care (Signed)
  Problem: Consults Goal: RH STROKE PATIENT EDUCATION Description: See Patient Education module for education specifics  Outcome: Progressing   Problem: RH BOWEL ELIMINATION Goal: RH STG MANAGE BOWEL WITH ASSISTANCE Description: STG Manage Bowel with mod I Assistance. Outcome: Progressing Goal: RH STG MANAGE BOWEL W/MEDICATION W/ASSISTANCE Description: STG Manage Bowel with Medication with mod I Assistance. Outcome: Progressing   Problem: RH BLADDER ELIMINATION Goal: RH STG MANAGE BLADDER WITH ASSISTANCE Description: STG Manage Bladder With toileting Assistance Outcome: Progressing   Problem: RH SAFETY Goal: RH STG ADHERE TO SAFETY PRECAUTIONS W/ASSISTANCE/DEVICE Description: STG Adhere to Safety Precautions With cues Assistance/Device. Outcome: Progressing   Problem: RH PAIN MANAGEMENT Goal: RH STG PAIN MANAGED AT OR BELOW PT'S PAIN GOAL Description: At or below level 4 w prns Outcome: Progressing   Problem: RH KNOWLEDGE DEFICIT Goal: RH STG INCREASE KNOWLEDGE OF DIABETES Description: Patient and spouse will be able to manage DM with medications and dietary modifications using handouts and educational resources independently Outcome: Progressing Goal: RH STG INCREASE KNOWLEDGE OF HYPERTENSION Description: Patient and spouse will be able to manage HTN with medications and dietary modifications using handouts and educational resources independently Outcome: Progressing Goal: RH STG INCREASE KNOWLEGDE OF HYPERLIPIDEMIA Description: Patient and spouse will be able to manage HLD with medications and dietary modifications using handouts and educational resources independently Outcome: Progressing Goal: RH STG INCREASE KNOWLEDGE OF STROKE PROPHYLAXIS Description: Patient and spouse will be able to manage secondary risks with medications and dietary modifications using handouts and educational resources independently Outcome: Progressing

## 2022-05-07 NOTE — Progress Notes (Signed)
Physical Therapy Session Note  Patient Details  Name: [name] MRN: [id number] Date of Birth: Apr 01, 1946  Today's Date: 05/07/2022 PT Individual Time: 5631-4970 PT Individual Time Calculation (min): 45 min   Short Term Goals: Week 1:  PT Short Term Goal 1 (Week 1): Pt will perform supine<>sit with supervision PT Short Term Goal 2 (Week 1): Pt will perform sit<>stands using LRAD with CGA PT Short Term Goal 3 (Week 1): Pt will perform bed<>chair transfers using LRAD with CGA PT Short Term Goal 4 (Week 1): Pt will ambulate at least 136f using LRAD with CGA PT Short Term Goal 5 (Week 1): Pt will participate in standardized outcome measure to assess fall risk  Skilled Therapeutic Interventions/Progress Updates:    Pt presents in the bed in fetal position and reporting significant pain/spasms limiting all of her therapies today. Pt declines any attempts at out of bed mobility at this time despite encouragement. Focused on education in regards to pain management techniques, especially use of breath during mobility (exhale on exertion) and breathing techniques as well as option to trial e-stim/TENS. Pt did not want to trial this today but educated on process, technique, and potential benefits. Focused on use of breath during rolling to reposition in the bed from L sidelying to supine to R sidelying with use of pillow between knees with min assist needed to stabilize LE's but pt able to maintain position in supine for about 1 min without external support to LE's. Once in R sidelying pt reports needing to urinate and using purewick - continued use of breath during rolling back to supine for placement and then able to roll to the R without spasms and yelling out. Pt set up with heating pad to low back area and pillows positioned for comfort and pressure relief. All needs in reach.   Therapy Documentation Precautions:  Precautions Precautions: Fall, Other (comment) Precaution Comments: pt has  personal L LE AFO Restrictions Weight Bearing Restrictions: No General: PT Amount of Missed Time (min): 15 Minutes PT Missed Treatment Reason: Pain  Pain: When spasms occur 10/10 pain rated - premedicated, applied heating pad, and used breathing techniques for pain management.      Therapy/Group: Individual Therapy  GCanary BrimBIvory Broad PT, DPT, CBIS  05/07/2022, 2:55 PM

## 2022-05-07 NOTE — Progress Notes (Signed)
Recreational Therapy Session Note  Patient Details  Name: [name] MRN: [id number] Date of Birth: 27-Dec-1945 Today's Date: 05/07/2022  Pt declined group session due to pain.  Pt missed 60 minute group today.  Pen Mar 05/07/2022, 9:35 AM

## 2022-05-08 LAB — PROTIME-INR
INR: 3.4 — ABNORMAL HIGH (ref 0.8–1.2)
Prothrombin Time: 33.8 seconds — ABNORMAL HIGH (ref 11.4–15.2)

## 2022-05-08 MED ORDER — WARFARIN SODIUM 3 MG PO TABS
3.0000 mg | ORAL_TABLET | Freq: Once | ORAL | Status: AC
  Administered 2022-05-08: 3 mg via ORAL
  Filled 2022-05-08: qty 1

## 2022-05-08 MED ORDER — SORBITOL 70 % SOLN
30.0000 mL | Freq: Every day | Status: DC | PRN
  Administered 2022-05-08 – 2022-05-23 (×4): 30 mL via ORAL
  Filled 2022-05-08 (×4): qty 30

## 2022-05-08 MED ORDER — WARFARIN SODIUM 2 MG PO TABS: 4.0000 mg | ORAL_TABLET | Freq: Every day | ORAL | Status: DC

## 2022-05-08 NOTE — Progress Notes (Signed)
Speech Language Pathology Weekly Progress and Session Note  Patient Details  Name: [name] MRN: [id number] Date of Birth: 08-Feb-1946  Beginning of progress report period: May 01, 2022 End of progress report period: May 08, 2022  Today's Date: 05/08/2022 SLP Individual Time: 1449-1530 SLP Individual Time Calculation (min): 41 min  Short Term Goals: Week 1: SLP Short Term Goal 1 (Week 1): Pt will use compensatory word finding strategies at the conversational level with supervision verbal cues. SLP Short Term Goal 1 - Progress (Week 1): Met SLP Short Term Goal 2 (Week 1): Pt will recognize and correct written errors at the sentence level with supervision verbal cues. SLP Short Term Goal 2 - Progress (Week 1): Met SLP Short Term Goal 3 (Week 1): Pt will utilize comensatory memory strategies to recall daily, functional information with min assist. SLP Short Term Goal 3 - Progress (Week 1): Met  New Short Term Goals: Week 2: SLP Short Term Goal 1 (Week 2): STG=LTG due to ELOS  Weekly Progress Updates: Patient has made functional gains and has met 3 of 3 STGs this reporting period. Participation has been limited recently due to increased pain. Pt is currently completing cognitive-linguistic tasks with modified independence-to-supervision level in regards to use of compensatory memory strategies, high level word finding, and written communication. Writing has been limited by fine motor deficits and "shakiness" that patient reports exhibiting at prior level. Patient and family education is ongoing. Patient would benefit from continued skilled SLP intervention to maximize cognitive and language functioning and overall functional independence prior to discharge.     Intensity: Minumum of 1-2 x/day, 30 to 90 minutes Frequency: 3 to 5 out of 7 days Duration/Length of Stay: 7/7 Treatment/Interventions: Cognitive remediation/compensation;Cueing hierarchy;Patient/family education;Functional  tasks;Speech/Language facilitation;Internal/external aids;Environmental controls   Daily Session Skilled Therapeutic Interventions: Skilled ST treatment focused on cognitive-linguistic goals. Pt was semi-reclined in bed on arrival and accompanied by her spouse who left shortly upon SLP's arrival. Pt reported improvement with back spasms but ongoing discomfort in back/hip region. Pt felt unable to reposition in bed in order to sit up for SLP session. Pt reports still feeling "fuzzy" and "out of it" however slightly improved as compared to yesterday. Pt exhibited decreased attention to functional conversation and was known to repeat several stories from previous encounters without awareness. Pt also exhibited increased upper extremity "shakiness" which was not observed to this extent during previous sessions. Notified nurse of increased forgetfulness, shakiness, and pt's report of feeling "out of it." SLP discussed ensuring adequate nutrition/hydration. SLP facilitated written expression at the sentence level. Writing was limited by poor legibility however pt generated sentences with appropriate syntax and spelling error x1 in which pt was able to self correct with sup A verbal cues. Pt used external aids to recall therapy schedule, date, and her nurse tech's name with sup A verbal cues. Patient was left in bed with alarm activated and immediate needs within reach at end of session. Continue per current plan of care.      General    Pain Pain Assessment Pain Scale: 0-10 Pain Score: 4  Pain Type: Chronic pain Pain Location: Back Pain Orientation: Right Pain Descriptors / Indicators: Discomfort Pain Onset: On-going Pain Intervention(s): Medication (See eMAR);RN made aware;Heat applied  Therapy/Group: Individual Therapy  Patty Sermons 05/08/2022, 5:08 PM

## 2022-05-08 NOTE — Progress Notes (Signed)
PROGRESS NOTE   Subjective/Complaints:  Patient seen in bed this morning is using aquatherapy/heating pad for right hip pain.  Continues to have low back spasms but without sciatic pain.  No reported new numbness of weakness in lower extremities.  ROS-neg CP, SOB, N/V/D, chills, or fever  Objective:   No results found. No results for input(s): "WBC", "HGB", "HCT", "PLT" in the last 72 hours.  No results for input(s): "NA", "K", "CL", "CO2", "GLUCOSE", "BUN", "CREATININE", "CALCIUM" in the last 72 hours.   Intake/Output Summary (Last 24 hours) at 05/08/2022 1219 Last data filed at 05/08/2022 0916 Gross per 24 hour  Intake 240 ml  Output 300 ml  Net -60 ml        Physical Exam: Vital Signs Blood pressure 116/69, pulse 97, temperature 98.5 F (36.9 C), temperature source Oral, resp. rate 16, height '5\' 7"'$  (1.702 m), weight 55 kg, SpO2 96 %.   General: Alert and oriented x 3, No apparent distress HEENT: Head is normocephalic, atraumatic, PERRLA, EOMI, sclera anicteric, oral mucosa pink and moist, dentition intact, ext ear canals clear,  Neck: Supple without JVD or lymphadenopathy Heart: Reg rate and rhythm. No murmurs rubs or gallops Chest: CTA bilaterally without wheezes, rales, or rhonchi; no distress Abdomen: Soft, non-tender, non-distended, bowel sounds positive. Extremities: No clubbing, cyanosis, or edema. Pulses are 2+ Psych: Pt's affect is appropriate. Pt is cooperative Skin: Clean and intact without signs of breakdown, warm and dry Neuro: CN II-XII intact, motor strength 4/5 bilateral deltoid, bicep, triceps, grip, hip flexor, knee extensors, ankle dorsiflexors/plantar flexors.  Sensory exam normal sensation to light touch lower extremities.  Speech with minimal dysarthria, sentence level, occasional word finding, but appears at baseline. Musculoskeletal: Pain in lumbar area   Assessment/Plan: 1. Functional  deficits which require 3+ hours per day of interdisciplinary therapy in a comprehensive inpatient rehab setting. Physiatrist is providing close team supervision and 24 hour management of active medical problems listed below. Physiatrist and rehab team continue to assess barriers to discharge/monitor patient progress toward functional and medical goals  Care Tool:  Bathing    Body parts bathed by patient: Right arm, Left arm, Chest, Abdomen, Front perineal area, Buttocks, Right upper leg, Left upper leg, Face   Body parts bathed by helper: Buttocks     Bathing assist Assist Level: Contact Guard/Touching assist     Upper Body Dressing/Undressing Upper body dressing   What is the patient wearing?: Pull over shirt    Upper body assist Assist Level: Minimal Assistance - Patient > 75%    Lower Body Dressing/Undressing Lower body dressing      What is the patient wearing?: Pants, Underwear/pull up     Lower body assist Assist for lower body dressing: Minimal Assistance - Patient > 75%     Toileting Toileting Toileting Activity did not occur (Clothing management and hygiene only): N/A (no void or bm)  Toileting assist Assist for toileting: Contact Guard/Touching assist     Transfers Chair/bed transfer  Transfers assist     Chair/bed transfer assist level: Contact Guard/Touching assist Chair/bed transfer assistive device: Armrests, Cane   Locomotion Ambulation   Ambulation assist  Assist level: Contact Guard/Touching assist Assistive device: Cane-quad Max distance: 245f   Walk 10 feet activity   Assist     Assist level: Contact Guard/Touching assist Assistive device: Cane-quad   Walk 50 feet activity   Assist    Assist level: Contact Guard/Touching assist Assistive device: Cane-quad    Walk 150 feet activity   Assist Walk 150 feet activity did not occur: Safety/medical concerns         Walk 10 feet on uneven surface   activity   Assist     Assist level: Contact Guard/Touching assist Assistive device: Cane-quad   Wheelchair     Assist Is the patient using a wheelchair?: No (only for time management due to slow gait speed)             Wheelchair 50 feet with 2 turns activity    Assist            Wheelchair 150 feet activity     Assist          Blood pressure 116/69, pulse 97, temperature 98.5 F (36.9 C), temperature source Oral, resp. rate 16, height '5\' 7"'$  (1.702 m), weight 55 kg, SpO2 96 %.  Medical Problem List and Plan: 1. 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               -Continue CIR PT,OT,SLP 2.  Antithrombotics: -DVT/anticoagulation: Coumadin resumed 04/29/2022 with bridging of Lovenox.  INR goal 2.5-3.5, INR still low , cont lovenox 62.'5mg'$  q12h INR 2.2 7/1 INR 3.4, see pharmacy recommendations to stop Lovenox decrease warfarin to 3 mg x 1 continue INR is daily and a CBC on Monday while monitoring for signs of bleeding.             -antiplatelet therapy: N/A 3. Pain: continue Neurontin 100 mg nightly, RObaxin Add tramadol, sportscreme and heat  7/1 This regimen is working well. 4. Mood/Sleep: Provide emotional support             -antipsychotic agents: N/A 5. Neuropsych/cognition: This patient is capable of making decisions on her own behalf. 6. Skin/Wound Care: Routine skin checks 7. Fluids/Electrolytes/Nutrition: Routine in and outs with follow-up chemistries 8.  History of mitral valve replacement 1997.  Resuming Coumadin with bridging of Lovenox 04/29/2022.  Follow-up per cardiology services as needed 9.  Hyperlipidemia.  Continue Lipitor 10.  Hypertension.  Continue Norvasc 5 mg daily Vitals:   05/07/22 1931 05/08/22 0303  BP: (!) 141/77 116/69  Pulse: (!) 110  97  Resp: 18 16  Temp: 99 F (37.2 C) 98.5 F (36.9 C)  SpO2: 96% 96%   Controlled 05/07/22 7/1 Had some spikes in Bp late yesterday, continue monitoring on 5 mg Norvasc daily 11.  Prediabetes.  Hemoglobin A1c 6.1.  Diet controlled/SSI discontinued 12.  History of lumbar spinal stenosis  with increased radicular pain , would not rec reschedule  ESI due to anticoag issues - would rec no treadmill training just functional ambulation  13.  GERD.  Protonix 14. Insomnia She plans to find out what medication she takes at home for consideration of use at the hospital 15. Hypokalemia  -KCL 284m x2 , recheck     Latest Ref Rng & Units  05/04/2022    6:10 AM 05/03/2022    6:16 AM 04/30/2022   12:26 AM  BMP  Glucose 70 - 99 mg/dL 122  118  135   BUN 8 - 23 mg/dL '20  19  20   '$ Creatinine 0.44 - 1.00 mg/dL 0.87  0.73  0.75   Sodium 135 - 145 mmol/L 142  138  141   Potassium 3.5 - 5.1 mmol/L 4.1  3.4  3.7   Chloride 98 - 111 mmol/L 106  105  109   CO2 22 - 32 mmol/L '29  28  26   '$ Calcium 8.9 - 10.3 mg/dL 9.7  9.1  9.2    7/1 K+ at 4.1 (6/27), trend with weekly labs 16. Anemia mild  -Continue to monitor    Latest Ref Rng & Units 05/03/2022    6:16 AM 04/30/2022   12:26 AM 04/29/2022    8:38 AM  CBC  WBC 4.0 - 10.5 K/uL 4.4  6.0  5.2   Hemoglobin 12.0 - 15.0 g/dL 11.4  11.9  12.4   Hematocrit 36.0 - 46.0 % 35.8  37.8  37.7   Platelets 150 - 400 K/uL 149  171  158      7/1 Last Hgb 11.4, trend with weekly labs  LOS: 8 days A FACE TO FACE EVALUATION WAS PERFORMED  Luetta Nutting 05/08/2022, 12:19 PM

## 2022-05-08 NOTE — Progress Notes (Signed)
[place] for warfarin w/ Lovenox to bridge Indication:  mechanical mitral valve  Allergies  Allergen Reactions   Zoloft [Sertraline] Hives, Itching and Rash    Patient Measurements: Height: '5\' 7"'$  (170.2 cm) Weight: 55 kg (121 lb 4.1 oz) IBW/kg (Calculated) : 61.6  Vital Signs: Temp: 98.5 F (36.9 C) (07/01 0303) Temp Source: Oral (07/01 0303) BP: 116/69 (07/01 0303) Pulse Rate: 97 (07/01 0303)  Labs: Recent Labs    05/06/22 0512 05/07/22 0508 05/08/22 1105  LABPROT 22.1* 24.3* 33.8*  INR 2.0* 2.2* 3.4*    Estimated Creatinine Clearance: 48.5 mL/min (by C-G formula based on SCr of 0.87 mg/dL).  Assessment: 51 YOF who presented to the ED with confusion and slurred speech. 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 She has a PMH positive for mMVR in 1997. Lovenox / warfarin from PTA was held on admission and resumed 6/22. Pharmacy consulted to manage Lovenox / warfarin during rehab admission.  INR up to 3.4 and is therapeutic. Patient only eating ~25% of meals. Will stop enoxaparin and decrease dose to stay in goal.    Goal of Therapy:  Anti-Xa level 0.6-1 units/ml 4hrs after LMWH dose given INR 2.5-3.5 Monitor platelets by anticoagulation protocol: Yes   Plan:  Stop Lovenox 62.5 mg SQ q12h Decrease warfarin to 3 mg x1   INR daily, CBC on Mon Monitor for signs of bleeding  Benetta Spar, PharmD, BCPS, BCCP Clinical Pharmacist  Please check AMION for all [place] phone numbers After 10:00 PM, call [place]

## 2022-05-09 LAB — PROTIME-INR
INR: 2.7 — ABNORMAL HIGH (ref 0.8–1.2)
Prothrombin Time: 28.6 seconds — ABNORMAL HIGH (ref 11.4–15.2)

## 2022-05-09 MED ORDER — WARFARIN SODIUM 5 MG PO TABS
5.0000 mg | ORAL_TABLET | Freq: Every day | ORAL | Status: DC
  Administered 2022-05-09 – 2022-05-12 (×4): 5 mg via ORAL
  Filled 2022-05-09 (×5): qty 1

## 2022-05-09 MED ORDER — ACETAMINOPHEN-CODEINE 300-30 MG PO TABS
1.0000 | ORAL_TABLET | Freq: Four times a day (QID) | ORAL | Status: DC | PRN
  Administered 2022-05-09 – 2022-05-10 (×3): 1 via ORAL
  Filled 2022-05-09 (×4): qty 1

## 2022-05-09 NOTE — Progress Notes (Signed)
Occupational Therapy Session Note  Patient Details  Name: [name] MRN: [id number] Date of Birth: 27-Sep-1946  Today's Date: 05/09/2022 OT Individual Time: 9622-2979 OT Individual Time Calculation (min): 35 min    Short Term Goals: Week 1:  OT Short Term Goal 1 (Week 1): Pt to be CGA for functional transfers with LRD OT Short Term Goal 2 (Week 1): Pt to be Min A for LB dressing/bathing with LRD OT Short Term Goal 3 (Week 1): Pt to be Min A for toileting tasks with LRD OT Short Term Goal 4 (Week 1): Pt to demonstrate increased coordination to the R UE during functional tasks  Skilled Therapeutic Interventions/Progress Updates:    Upon OT arrival, pt reports significant pain in lower back with movement and is anxious about moving with therapy. Pt requires extensive education on benefits of movements and moving after being premedicated. Pt states "I just know what is to come when I move and it's not good". Pt yells out with initiation to move without this therapist moving LE yet. With assist from LPN, pt completes supine to sit transfer with Max A x 2 and sits EOB with Min A regressing to Max A secondary to patient reports she feels warm. Pt was provided with a wet washcloth to wash her face with Min A. Pt's purewick was removed and pt was returned to supine with Max A and scooted up to [facility] with Total A x 2 with draw pad. Pt completes oral care with Min A with HOB elevated. Pt was left in bed at end of session with all needs in reach. Pt limited by pain this session and missed 25 minutes of OT treatment.   Therapy Documentation Precautions:  Precautions Precautions: Fall, Other (comment) Precaution Comments: pt has personal L LE AFO Restrictions Weight Bearing Restrictions: No General: General OT Amount of Missed Time: 25 Minutes    Therapy/Group: Individual Therapy  Sonyia Muro 05/09/2022, 11:11 AM

## 2022-05-09 NOTE — Progress Notes (Signed)
Physical Therapy Weekly Progress Note  Patient Details  Name: NIAJAH SIPOS MRN: [id number] Date of Birth: [birth year]  Beginning of progress report period: May 01, 2022 End of progress report period: May 09, 2022   Patient has met 0 of 5 short term goals.  Pt was meeting all goals of CGA and on track for discharge on 7/7 at supervision level until evening of 6/29 when patient started to have debilitating spasms and pain limiting patients ability to tolerate any form of OOB or mobility.Pt able to get OOB with total+2 assist on 7/2 for the first time since 6/29. At this time pt requires min to max assist for bed mobility and +2 for transfers (again only occurred 1-2 times since initial onset of symptoms). Anticipate pt may require extension of d/c date due to several days of missed therapies at this time unless pain management can be controlled and pt returns to functional level she was on 6/29 (overall CGA > 150' with QC). Hands on family education will need to happen prior to d/c but again has been limited the last couple days. Will continue to monitor.   Patient continues to demonstrate the following deficits muscle weakness, muscle joint tightness, and muscle paralysis, decreased cardiorespiratoy endurance, impaired timing and sequencing, abnormal tone, decreased coordination, and decreased motor planning, decreased awareness, decreased problem solving, decreased memory, and delayed processing, and decreased sitting balance, decreased standing balance, decreased postural control, hemiplegia, and decreased balance strategies and therefore will continue to benefit from skilled PT intervention to increase functional independence with mobility.  Patient  is currently not progressing towards goals, but hope that with pain management pt can resume to level of function on 6/29 when she was on track .  Continue plan of care. Will not change goals at this time as still feel these are appropriate and that  pain is limiting factor at this time.  PT Short Term Goals Week 1:  PT Short Term Goal 1 (Week 1): Pt will perform supine<>sit with supervision PT Short Term Goal 1 - Progress (Week 1): Not met PT Short Term Goal 2 (Week 1): Pt will perform sit<>stands using LRAD with CGA PT Short Term Goal 2 - Progress (Week 1): Not met PT Short Term Goal 3 (Week 1): Pt will perform bed<>chair transfers using LRAD with CGA PT Short Term Goal 3 - Progress (Week 1): Not met PT Short Term Goal 4 (Week 1): Pt will ambulate at least 127f using LRAD with CGA PT Short Term Goal 4 - Progress (Week 1): Not met PT Short Term Goal 5 (Week 1): Pt will participate in standardized outcome measure to assess fall risk PT Short Term Goal 5 - Progress (Week 1): Not met Week 2:  PT Short Term Goal 1 (Week 2): Pt will perform bed mobility with supervision PT Short Term Goal 2 (Week 2): Pt will perform basic transfers with CGA and LRAD PT Short Term Goal 3 (Week 2): Pt will perform gait x 125' with LRAD and CGA PT Short Term Goal 4 (Week 2): Pt will participated in standardized fall risk assessment  Skilled Therapeutic Interventions/Progress Updates:  Ambulation/gait training;Community reintegration;DME/adaptive equipment instruction;Neuromuscular re-education;Psychosocial support;Stair training;UE/LE Strength taining/ROM;Balance/vestibular training;Discharge planning;Functional electrical stimulation;Pain management;Skin care/wound management;Therapeutic Activities;UE/LE Coordination activities;Cognitive remediation/compensation;Disease management/prevention;Functional mobility training;Patient/family education;Splinting/orthotics;Therapeutic Exercise;Visual/perceptual remediation/compensation   Therapy Documentation Precautions:  Precautions Precautions: Fall, Other (comment) Precaution Comments: pt has personal L LE AFO Restrictions Weight Bearing Restrictions: No    GCanary BrimBIvory Broad PT, DPT,  CBIS 05/09/2022, 12:26 PM

## 2022-05-09 NOTE — Progress Notes (Signed)
Notified NP of continued back pack. New orders placed.   Yehuda Mao, LPN

## 2022-05-09 NOTE — Progress Notes (Signed)
[place] for warfarin w/ Lovenox to bridge Indication:  mechanical mitral valve  Allergies  Allergen Reactions   Zoloft [Sertraline] Hives, Itching and Rash    Patient Measurements: Height: '5\' 7"'$  (170.2 cm) Weight: 55 kg (121 lb 4.1 oz) IBW/kg (Calculated) : 61.6  Vital Signs: Temp: 97.6 F (36.4 C) (07/02 0242) Temp Source: Oral (07/02 0242) BP: 105/61 (07/02 0242) Pulse Rate: 72 (07/02 0745)  Labs: Recent Labs    05/07/22 0508 05/08/22 1105 05/09/22 0507  LABPROT 24.3* 33.8* 28.6*  INR 2.2* 3.4* 2.7*    Estimated Creatinine Clearance: 48.5 mL/min (by C-G formula based on SCr of 0.87 mg/dL).  Assessment: 41 YOF who presented to the ED with confusion and slurred speech. 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 She has a PMH positive for mMVR in 1997. Lovenox / warfarin from PTA was held on admission and resumed 6/22. Pharmacy consulted to manage Lovenox / warfarin during rehab admission.  INR down to 2.7 and is therapeutic. Patient only eating 0-25% of most meals.    Goal of Therapy:  Anti-Xa level 0.6-1 units/ml 4hrs after LMWH dose given INR 2.5-3.5 Monitor platelets by anticoagulation protocol: Yes   Plan:  Schedule warfarin '5mg'$  daily  INR Q Mon/Thur, CBC on Mon Monitor for signs of bleeding  Benetta Spar, PharmD, BCPS, BCCP Clinical Pharmacist  Please check AMION for all [place] phone numbers After 10:00 PM, call [place]

## 2022-05-09 NOTE — Progress Notes (Signed)
PROGRESS NOTE   Subjective/Complaints:  Patient seen in bed this morning is reporting that pain is 9/10 in lower back.  Per patient and nurse, she is having break though pain with tramadol not lasting until next dose. The pain is making it difficult to participate in therapy. No reported new numbness of weakness in lower extremities. Per nurse, she is using Purewick instead of BSC, trying to work on control pain with use of BSC instead.  ROS-neg CP, SOB, N/V/D, chills, or fever  Objective:   No results found. No results for input(s): "WBC", "HGB", "HCT", "PLT" in the last 72 hours.  No results for input(s): "NA", "K", "CL", "CO2", "GLUCOSE", "BUN", "CREATININE", "CALCIUM" in the last 72 hours.   Intake/Output Summary (Last 24 hours) at 05/09/2022 1119 Last data filed at 05/09/2022 0950 Gross per 24 hour  Intake 720 ml  Output 600 ml  Net 120 ml        Physical Exam: Vital Signs Blood pressure 105/61, pulse 72, temperature 97.6 F (36.4 C), temperature source Oral, resp. rate 18, height '5\' 7"'$  (1.702 m), weight 55 kg, SpO2 95 %.   General: Alert and oriented x 3, No apparent distress HEENT: Head is normocephalic, atraumatic, PERRLA, EOMI, sclera anicteric, oral mucosa pink and moist, dentition intact, ext ear canals clear. Neck: Supple without JVD or lymphadenopathy Heart: Reg rate and rhythm. No murmurs rubs or gallops Chest: CTA bilaterally without wheezes, rales, or rhonchi; no distress Abdomen: Soft, non-tender, non-distended, bowel sounds positive. Extremities: No clubbing, cyanosis, or edema. Pulses are 2+ Psych: Pt's affect is appropriate. Pt is cooperative Skin: Clean and intact without signs of breakdown, warm and dry Neuro: CN II-XII intact, motor strength 4/5 bilateral deltoid, bicep, triceps, grip, hip flexor, knee extensors, ankle dorsiflexors/plantar flexors.  Sensory exam normal sensation to light touch lower  extremities.  Speech with minimal dysarthria, sentence level, occasional word finding, but appears at baseline. Musculoskeletal: Pain in lumbar area, today 9/10 not relieved by Aquatherapy, breakthrough pain with tramadol.  Pain limiting use of BSC and working with therapy.   Assessment/Plan: 1. Functional deficits which require 3+ hours per day of interdisciplinary therapy in a comprehensive inpatient rehab setting. Physiatrist is providing close team supervision and 24 hour management of active medical problems listed below. Physiatrist and rehab team continue to assess barriers to discharge/monitor patient progress toward functional and medical goals  Care Tool:  Bathing    Body parts bathed by patient: Right arm, Left arm, Chest, Abdomen, Front perineal area, Buttocks, Right upper leg, Left upper leg, Face   Body parts bathed by helper: Buttocks     Bathing assist Assist Level: Contact Guard/Touching assist     Upper Body Dressing/Undressing Upper body dressing   What is the patient wearing?: Pull over shirt    Upper body assist Assist Level: Minimal Assistance - Patient > 75%    Lower Body Dressing/Undressing Lower body dressing      What is the patient wearing?: Pants, Underwear/pull up     Lower body assist Assist for lower body dressing: Minimal Assistance - Patient > 75%     Toileting Toileting Toileting Activity did not occur Landscape architect  and hygiene only): N/A (no void or bm)  Toileting assist Assist for toileting: Contact Guard/Touching assist     Transfers Chair/bed transfer  Transfers assist     Chair/bed transfer assist level: Contact Guard/Touching assist Chair/bed transfer assistive device: Armrests, Cane   Locomotion Ambulation   Ambulation assist      Assist level: Contact Guard/Touching assist Assistive device: Cane-quad Max distance: 257f   Walk 10 feet activity   Assist     Assist level: Contact Guard/Touching  assist Assistive device: Cane-quad   Walk 50 feet activity   Assist    Assist level: Contact Guard/Touching assist Assistive device: Cane-quad    Walk 150 feet activity   Assist Walk 150 feet activity did not occur: Safety/medical concerns         Walk 10 feet on uneven surface  activity   Assist     Assist level: Contact Guard/Touching assist Assistive device: Cane-quad   Wheelchair     Assist Is the patient using a wheelchair?: No (only for time management due to slow gait speed)             Wheelchair 50 feet with 2 turns activity    Assist            Wheelchair 150 feet activity     Assist          Blood pressure 105/61, pulse 72, temperature 97.6 F (36.4 C), temperature source Oral, resp. rate 18, height '5\' 7"'$  (1.702 m), weight 55 kg, SpO2 95 %.  Medical Problem List and Plan: 1. 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               -Continue CIR PT,OT,SLP 2.  Antithrombotics: -DVT/anticoagulation: Coumadin resumed 04/29/2022 with bridging of Lovenox.  INR goal 2.5-3.5, INR still low , cont lovenox 62.'5mg'$  q12h INR 2.2 7/1 INR 3.4, see pharmacy recommendations to stop Lovenox decrease warfarin to 3 mg x 1 continue INR is daily and a CBC on Monday while monitoring for signs of bleeding.             -antiplatelet therapy: N/A 3. Pain: continue Neurontin 100 mg nightly, RObaxin Add tramadol, sportscreme and heat  7/1 This regimen is working well. 7/2 Flare up of pain today, 9/10 in lower back, prn tramadol 50 mg appears to work but pain ramps up again prior to next dose.  Spoke with Dr. LDagoberto Ligas MD can't increase frequency for tramadol.  Suggested trial of Tylenol-3 (acetaminophen-codeine, 300-30) q6hrs, alternating use with prn Tramadol 50 mg.  Order  was placed for only 24 hrs duration, for re-evaluation on 05/10/22 by primary team. 4. Mood/Sleep: Provide emotional support             -antipsychotic agents: N/A 5. Neuropsych/cognition: This patient is capable of making decisions on her own behalf. 6. Skin/Wound Care: Routine skin checks 7. Fluids/Electrolytes/Nutrition: Routine in and outs with follow-up chemistries 8.  History of mitral valve replacement 1997.  Resuming Coumadin with bridging of Lovenox 04/29/2022.  Follow-up per cardiology services as needed 9.  Hyperlipidemia.  Continue Lipitor 10.  Hypertension.  Continue Norvasc 5 mg daily Vitals:   05/09/22 0242 05/09/22 0745  BP: 105/61   Pulse: (!) 108  72  Resp: 18   Temp: 97.6 F (36.4 C)   SpO2: 95%    Controlled 05/07/22 7/1 Had some spikes in Bp late yesterday, continue monitoring on 5 mg Norvasc daily 7/2 Today bp controlled SBP 100's-110's 11.  Prediabetes.  Hemoglobin A1c 6.1.  Diet controlled/SSI discontinued 12.  History of lumbar spinal stenosis  with increased radicular pain , would not rec reschedule  ESI due to anticoag issues - would rec no treadmill training just functional ambulation  13.  GERD.  Protonix 14. Insomnia She plans to find out what medication she takes at home for consideration of use at the hospital 15. Hypokalemia  -KCL 26mq x2 , recheck     Latest Ref Rng & Units 05/04/2022    6:10 AM 05/03/2022    6:16 AM 04/30/2022   12:26 AM  BMP  Glucose 70 - 99 mg/dL 122  118  135   BUN 8 - 23 mg/dL '20  19  20   '$ Creatinine 0.44 - 1.00 mg/dL 0.87  0.73  0.75   Sodium 135 - 145 mmol/L 142  138  141   Potassium 3.5 - 5.1 mmol/L 4.1  3.4  3.7   Chloride 98 - 111 mmol/L 106  105  109   CO2 22 - 32 mmol/L '29  28  26   '$ Calcium 8.9 - 10.3 mg/dL 9.7  9.1  9.2    7/2 K+ at 4.1 (6/27), trend with weekly labs 16. Anemia mild  -Continue to monitor    Latest Ref Rng & Units 05/03/2022    6:16 AM 04/30/2022   12:26 AM 04/29/2022    8:38 AM  CBC  WBC 4.0 -  10.5 K/uL 4.4  6.0  5.2   Hemoglobin 12.0 - 15.0 g/dL 11.4  11.9  12.4   Hematocrit 36.0 - 46.0 % 35.8  37.8  37.7   Platelets 150 - 400 K/uL 149  171  158      7/2 Last Hgb 11.4, trend with weekly labs  LOS: 9 days A FACE TO FACE EVALUATION WAS PERFORMED  LLuetta Nutting7/12/2021, 11:19 AM

## 2022-05-09 NOTE — Progress Notes (Addendum)
Late Entry: Per patient she has been using purwick. Educated patient on the importance of not using a purwick. Purwick removed Patient verbalized understanding with no issues. Patient reports chronic back pain upon assessment, pain regimen followed per MD orders. NP aware.

## 2022-05-09 NOTE — Progress Notes (Signed)
Physical Therapy Session Note  Patient Details  Name: [name] MRN: [id number] Date of Birth: 08/21/1946  Today's Date: 05/09/2022 PT Individual Time: 1001-1059 PT Individual Time Calculation (min): 58 min   Short Term Goals: Week 1:  PT Short Term Goal 1 (Week 1): Pt will perform supine<>sit with supervision PT Short Term Goal 2 (Week 1): Pt will perform sit<>stands using LRAD with CGA PT Short Term Goal 3 (Week 1): Pt will perform bed<>chair transfers using LRAD with CGA PT Short Term Goal 4 (Week 1): Pt will ambulate at least 136f using LRAD with CGA PT Short Term Goal 5 (Week 1): Pt will participate in standardized outcome measure to assess fall risk  Skilled Therapeutic Interventions/Progress Updates:    Pt presents in bed and stating she still has not been able to get up. Discussed importance and goal to get OOB today especially also due to needing to have BM and c/o of nausea and educated on how gravity can positively impact BM as well. RN also reinforced this education. With extra time and encouragement, pt initiated rolling to the R slowly and cues for use of breath to help get through spasms (happened twice) with min assist. Again significant amount of time and cues for technique (due to impaired motor planning?) with mod and then max assist to come to EOB in seated position (pt with +spasms sending body into flexion and pt yelling out) but worked through it and sat EOB about 10 sec with mod assist before stating she felt really dizzy and hot. Anticipate that patient may have had BP drop and educated on how body regulates BP and since she has not sat upright in days, feeling dizzy/orthostatic may be likely. Mod assist needed to return to supine with BLE assist needed. Extra time to recover and cool wash cloth applied. RN arrive in room to report on status on meds and in agreement to assist with goal of getting OOB and providing encouragement to patient that it also may help with BM.  Used recliner to allow for option for reclined position if unable to tolerate due to orthostatic. Required max assist again (again with spasms) to come to seated position EOB and max to sustain position while patient became nausous and dry heaved. Then transitioned to transfer with facilitation and cues for anterior weightshift and technique, requiring +2 for stand pivot to recliner with pt again yelling but agreeable that we needed to get into the chair and prefers not to use a dependent lift. Once in chair, pt able to calm down and get in position of semi comfort with pillows and towel roll set up. Cool wash cloth still applied and emesis bags nearby.  RN present to administer medication.   Therapy Documentation Precautions:  Precautions Precautions: Fall, Other (comment) Precaution Comments: pt has personal L LE AFO Restrictions Weight Bearing Restrictions: No  Pain: Reports pain has still been about the same overall (unrated) and reports in hip/back area and has continued to impact ability to perform OOB activities. RN administered medication at end of session (awaiting from pharmacy).     Therapy/Group: Individual Therapy  GCanary BrimBIvory Broad PT, DPT, CBIS  05/09/2022, 11:38 AM

## 2022-05-10 LAB — PROTIME-INR
INR: 3.5 — ABNORMAL HIGH (ref 0.8–1.2)
Prothrombin Time: 35 seconds — ABNORMAL HIGH (ref 11.4–15.2)

## 2022-05-10 MED ORDER — METHOCARBAMOL 500 MG PO TABS
500.0000 mg | ORAL_TABLET | Freq: Four times a day (QID) | ORAL | Status: DC | PRN
  Administered 2022-05-10 – 2022-05-12 (×4): 500 mg via ORAL
  Filled 2022-05-10 (×4): qty 1

## 2022-05-10 MED ORDER — HYDROCODONE-ACETAMINOPHEN 5-325 MG PO TABS
1.0000 | ORAL_TABLET | Freq: Four times a day (QID) | ORAL | Status: DC | PRN
  Administered 2022-05-11 – 2022-05-15 (×7): 1 via ORAL
  Filled 2022-05-10 (×8): qty 1

## 2022-05-10 NOTE — Progress Notes (Signed)
ANTICOAGULATION CONSULT NOTE  Pharmacy Consult for warfarin  Indication:  mechanical mitral valve  Allergies  Allergen Reactions   Zoloft [Sertraline] Hives, Itching and Rash    Patient Measurements: Height: '5\' 7"'$  (170.2 cm) Weight: 55 kg (121 lb 4.1 oz) IBW/kg (Calculated) : 61.6  Vital Signs: Temp: 98.5 F (36.9 C) (07/03 0355) Temp Source: Oral (07/03 0355) BP: 111/71 (07/03 0355) Pulse Rate: 109 (07/03 0355)  Labs: Recent Labs    05/08/22 1105 05/09/22 0507 05/10/22 0600  LABPROT 33.8* 28.6* 35.0*  INR 3.4* 2.7* 3.5*     Estimated Creatinine Clearance: 48.5 mL/min (by C-G formula based on SCr of 0.87 mg/dL).  Assessment: 60 YOF who presented to the ED with confusion and slurred speech. 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 She has a PMH positive for mMVR in 1997. Lovenox / warfarin from PTA was held on admission and resumed 6/22. Pharmacy consulted to manage warfarin during rehab admission; Lovenox discontinued since INR therapeutic.  INR therapeutic at 3.5. Patient eating 25-75% of meals. No bleeding noted, last CBC 6/26.  Goal of Therapy:  INR 2.5-3.5 Monitor platelets by anticoagulation protocol: Yes   Plan:  Schedule warfarin 5 mg PO daily  INR Q Mon/Thur Monitor for signs of bleeding  Thank you for involving pharmacy in this patient's care.  Renold Genta, PharmD, BCPS Clinical Pharmacist Clinical phone for 05/10/2022 until 3p is [phone] 05/10/2022 10:50 AM

## 2022-05-10 NOTE — Progress Notes (Signed)
Occupational Therapy Weekly Progress Note  Patient Details  Name: [name] MRN: [id number] Date of Birth: 24-Nov-1945  Beginning of progress report period: May 01, 2022 End of progress report period: May 10, 2022  Today's Date: 05/10/2022 OT Individual Time: 2641-5830 OT Individual Time Calculation (min): 32 min    Patient has met 0 of 4 short term goals.  Patient has struggled with severe back spasms this week, and mobility status has been compromised.  Patient with multiple strokes, and significant resultant deficits.    Patient continues to demonstrate the following deficits: muscle weakness and muscle joint tightness, abnormal tone, unbalanced muscle activation, decreased coordination, and decreased motor planning, decreased midline orientation and decreased motor planning, decreased initiation and delayed processing, and decreased sitting balance, decreased standing balance, decreased postural control, hemiplegia, and decreased balance strategies and therefore will continue to benefit from skilled OT intervention to enhance overall performance with BADL.  Patient  has not met short term goals and is limited by significant back pain  Patient has had medication changes which seem to be reducing pain, which should allow her better ability to participate in therapy program .  Continue plan of care.  OT Short Term Goals Week 2:  OT Short Term Goal 1 (Week 2): Patient will transition from sit to stand with mod assist to prepare for toilet transfer OT Short Term Goal 2 (Week 2): Patient will don pants over feet with no more than min assist OT Short Term Goal 3 (Week 2): Patient will pull pants over hips with no more than mod assists OT Short Term Goal 4 (Week 2): Patient will maintain standing with BUE in support for 10 seconds with mod assist in preparation for LB ADL  Skilled Therapeutic Interventions/Progress Updates:    Patient received supine in bed for this session.  Patient report  pain 4/10 in low back - Nurse brought in pain medication.  Patient agreeable to get out of bed.  Patient assisted to sit at edge of bed.  Worked to align patient's body in midline with feet on floor.  Assisted patient to don compression hose, shoes and AFO.  Transferred patient sit to stand  and stand step transfer to wheelchair.  Patient with strong posterior bias in standing.  Needed cueing and facilitation to weight shift toward right to maneuver left leg throughout transfer.  Patient left up in wheelchair with husband in room, and personal items within reach.    Therapy Documentation Precautions:  Precautions Precautions: Fall, Other (comment) Precaution Comments: pt has personal L LE AFO Restrictions Weight Bearing Restrictions: No  Pain:  4/10 pain in back lying supine in bed       Therapy/Group: Individual Therapy  [name] 05/10/2022, 6:02 PM

## 2022-05-10 NOTE — Progress Notes (Signed)
Occupational Therapy Session Note  Patient Details  Name: [name] MRN: [id number] Date of Birth: 21-Jul-1946  Today's Date: 05/10/2022 OT Individual Time: 1400-1430 OT Individual Time Calculation (min): 30 min    Short Term Goals: Week 1:  OT Short Term Goal 1 (Week 1): Pt to be CGA for functional transfers with LRD OT Short Term Goal 2 (Week 1): Pt to be Min A for LB dressing/bathing with LRD OT Short Term Goal 3 (Week 1): Pt to be Min A for toileting tasks with LRD OT Short Term Goal 4 (Week 1): Pt to demonstrate increased coordination to the R UE during functional tasks  Skilled Therapeutic Interventions/Progress Updates:    Pt received in w/c with family in room, family left during session. Pt stated she did well in last session, transferring to w/c and even putting her shoes on herself.  She reported that she did not have any muscle spasms during this time frame. (Have not consulted with that therapist to see if this was fully accurate).  Encouraged pt to build on her success earlier.  Pt leaning back in w/c in severe posterior pelvic tilt with hips 2-3 inches from back of chair.  Spent well over 15 minutes trying NUMEROUS strategies and techniques to get pt to lean forward so I could help her scoot her hips back to have improved sitting position. With every attempt, she would have LE spasms and scream out in discomfort. The spasm would only last momentarily.  Unable to reposition pt but she stated she was comfortable.   Worked on LUE AROM, pt has fairly good ROM and coordination.    Attempted sit to stands by positioning pt at side of bed facing rail so she could pull up on rail. On each attempt, had LE spasms, screaming and not able to attempt.  Pt very calm but stated she did NOT want to feel pain. Discussed with her that enduring some pain temporarily will help her be more mobile in the long run.  Pt resting in wc with belt alarm on and all needs met.   Therapy  Documentation Precautions:  Precautions Precautions: Fall, Other (comment) Precaution Comments: pt has personal L LE AFO Restrictions Weight Bearing Restrictions: No  Pain: Pain Assessment Pain Scale: 0-10 Pain Score: 0-No pain Pain Intervention(s): Medication (See eMAR) ADL: ADL Eating: Set up Where Assessed-Eating: Chair Grooming: Contact guard Where Assessed-Grooming: Standing at sink, Sitting at sink Upper Body Bathing: Supervision/safety, Minimal cueing Where Assessed-Upper Body Bathing: Sitting at sink Lower Body Bathing: Contact guard, Minimal cueing Where Assessed-Lower Body Bathing: Standing at sink, Sitting at sink Upper Body Dressing: Minimal assistance Where Assessed-Upper Body Dressing: Sitting at sink Lower Body Dressing: Minimal assistance Where Assessed-Lower Body Dressing: Sitting at sink, Standing at sink Toileting: Contact guard Where Assessed-Toileting: Glass blower/designer: Therapist, music Method: Counselling psychologist: Raised toilet seat Tub/Shower Transfer: Unable to assess [employer] Transfer: Unable to assess   Therapy/Group: Individual Therapy  [place] 05/10/2022, 12:49 PM

## 2022-05-10 NOTE — Progress Notes (Signed)
Physical Therapy Session Note  Patient Details  Name: [name] MRN: [id number] Date of Birth: Sep 08, 1946  Today's Date: 05/10/2022 PT Individual Time: 0902-1005 PT Individual Time Calculation (min): 63 min   Short Term Goals: Week 2:  PT Short Term Goal 1 (Week 2): Pt will perform bed mobility with supervision PT Short Term Goal 2 (Week 2): Pt will perform basic transfers with CGA and LRAD PT Short Term Goal 3 (Week 2): Pt will perform gait x 125' with LRAD and CGA PT Short Term Goal 4 (Week 2): Pt will participated in standardized fall risk assessment  Skilled Therapeutic Interventions/Progress Updates:    Pt presents in bed in crunched up position and R neck laterally flexed - reports she is comfortable. Discussed goals of session today to continue to progress OOB and pt also reports she still has not had a BM though states nausea is a little better today. Encouragement and education provided in regards to attempting mobility and also encouraged her to have her husband stay for therapies as she states she does not think he has an understanding of what is going on. Discussed her current LOS/discharge date is also dependent on progress and that we generally always plan to have family go through training before discharge.   Continues with significant amount of extra time, encouragement and cues for technique with mod assist for rolling, and max with ultimately +2 assist for supine to sit to assist with trunk and LE's at the same time due to spasms and pain. Once EOB pt able with time to sit with close supervision and progress to taking hand off with min assist for balance. Pt able to sit EOB almost 8 min today. Max assist for sit > stand with cues and facilitation for anterior weightshift with max assist to maintain standing while working on weightshifting for stepping to the w/c to the L and max cues for upright posture. Once in w/c repositioned and then pt reports that she is urinating -  missed the brief she had on and ended up leaking out. Performed max assist sit> stands again for  hygiene and changing/cleaning cushion and new brief. Pt with periods of complaint of feeling "hot" but with cues for breathing and using a small fan, pt able to tolerate upright position and symptoms resolved. Set up in w/c with all needs in reach and pillows positioned for improved sitting tolerance. NT made aware of incontinent episode as well.    Therapy Documentation Precautions:  Precautions Precautions: Fall, Other (comment) Precaution Comments: pt has personal L LE AFO Restrictions Weight Bearing Restrictions: No  Pain: Pain up to 9/10 with movement in low back and spasms affecting full body but tolerable at rest. Premedicated.      Therapy/Group: Individual Therapy  Canary Brim Ivory Broad, PT, DPT, CBIS  05/10/2022, 10:32 AM

## 2022-05-10 NOTE — Progress Notes (Signed)
Speech Language Pathology Daily Session Note  Patient Details  Name: [name] MRN: [id number] Date of Birth: 11-Nov-1945  Today's Date: 05/10/2022 SLP Individual Time: 1000-1045 SLP Individual Time Calculation (min): 45 min  Short Term Goals: Week 2: SLP Short Term Goal 1 (Week 2): STG=LTG due to ELOS  Skilled Therapeutic Interventions: Skilled ST treatment focused on cognitive-communication goals. Pt was sitting upright in wheelchair on arrival. Pt didn't appear to recognize therapist and required clarification name and type of therapy. Pt was oriented to person/place/time/situation with the exception of day of week. Pt appeared overall slower to process with increased verbal repetition and redirection necessary for perseverating on previous topics/tasks. Pt reported she feels her cognition has been affected by increased pain. SLP communicated observations with team; MD supports increasing pain medication which may have sedative effects. Pt denied discomfort at rest while sitting in wheelchair for duration of session. SLP facilitated session by providing education on internal memory strategies including repetition, association, and visualization. Pt utilized strategies to retain list of 4 words with 50% accuracy for immediate recall. She recalled words by attribute in field of 4 with 57% accuracy given mod A verbal cues. Pt obtained 0% accuracy with word list of 5. SLP facilitated word scramble given category cue with extended processing time min A redirection cues to recall category. Patient was left in wheelchair with alarm activated and immediate needs within reach at end of session. Continue per current plan of care.      Pain Pain Assessment Pain Scale: 0-10 Pain Score: 0-No pain   Therapy/Group: Individual Therapy  Patty Sermons 05/10/2022, 10:44 AM

## 2022-05-10 NOTE — Progress Notes (Signed)
PROGRESS NOTE   Subjective/Complaints:  Has low back pain worse on left with movement  No LE radiating pain   ROS-neg CP, SOB, N/V/D, chills, or fever  Objective:   No results found. No results for input(s): "WBC", "HGB", "HCT", "PLT" in the last 72 hours.  No results for input(s): "NA", "K", "CL", "CO2", "GLUCOSE", "BUN", "CREATININE", "CALCIUM" in the last 72 hours.   Intake/Output Summary (Last 24 hours) at 05/10/2022 0800 Last data filed at 05/09/2022 2143 Gross per 24 hour  Intake 1560 ml  Output 350 ml  Net 1210 ml         Physical Exam: Vital Signs Blood pressure 111/71, pulse (!) 109, temperature 98.5 F (36.9 C), temperature source Oral, resp. rate 18, height '5\' 7"'$  (1.702 m), weight 55 kg, SpO2 97 %.    General: No acute distress Mood and affect are appropriate Heart: Regular rate and rhythm no rubs murmurs or extra sounds Lungs: Clear to auscultation, breathing unlabored, no rales or wheezes Abdomen: Positive bowel sounds, soft nontender to palpation, nondistended Extremities: No clubbing, cyanosis, or edema Skin: No evidence of breakdown, no evidence of rash   Neuro: CN II-XII intact, motor strength 4/5 bilateral deltoid, bicep, triceps, grip, hip flexor, knee extensors, ankle dorsiflexors/plantar flexors.  Sensory exam normal sensation to light touch lower extremities.  Speech with minimal dysarthria, sentence level, occasional word finding, but appears at baseline. Musculoskeletal: tenderness to palpation Left lumbar paraspinal area , neg SLR BLE   Assessment/Plan: 1. Functional deficits which require 3+ hours per day of interdisciplinary therapy in a comprehensive inpatient rehab setting. Physiatrist is providing close team supervision and 24 hour management of active medical problems listed below. Physiatrist and rehab team continue to assess barriers to discharge/monitor patient progress toward  functional and medical goals  Care Tool:  Bathing    Body parts bathed by patient: Right arm, Left arm, Chest, Abdomen, Front perineal area, Buttocks, Right upper leg, Left upper leg, Face   Body parts bathed by helper: Buttocks     Bathing assist Assist Level: Contact Guard/Touching assist     Upper Body Dressing/Undressing Upper body dressing   What is the patient wearing?: Pull over shirt    Upper body assist Assist Level: Minimal Assistance - Patient > 75%    Lower Body Dressing/Undressing Lower body dressing      What is the patient wearing?: Pants, Underwear/pull up     Lower body assist Assist for lower body dressing: Minimal Assistance - Patient > 75%     Toileting Toileting Toileting Activity did not occur (Clothing management and hygiene only): N/A (no void or bm)  Toileting assist Assist for toileting: Contact Guard/Touching assist     Transfers Chair/bed transfer  Transfers assist     Chair/bed transfer assist level: 2 Helpers Chair/bed transfer assistive device: Armrests, Research officer, political party   Ambulation assist      Assist level: Contact Guard/Touching assist Assistive device: Cane-quad Max distance: 24f   Walk 10 feet activity   Assist     Assist level: Contact Guard/Touching assist Assistive device: Cane-quad   Walk 50 feet activity   Assist    Assist  level: Contact Guard/Touching assist Assistive device: Cane-quad    Walk 150 feet activity   Assist Walk 150 feet activity did not occur: Safety/medical concerns         Walk 10 feet on uneven surface  activity   Assist     Assist level: Contact Guard/Touching assist Assistive device: Cane-quad   Wheelchair     Assist Is the patient using a wheelchair?: No (only for time management due to slow gait speed)             Wheelchair 50 feet with 2 turns activity    Assist            Wheelchair 150 feet activity     Assist           Blood pressure 111/71, pulse (!) 109, temperature 98.5 F (36.9 C), temperature source Oral, resp. rate 18, height '5\' 7"'$  (1.702 m), weight 55 kg, SpO2 97 %.  Medical Problem List and Plan: 1. 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  Antithrombotics: -DVT/anticoagulation: Coumadin resumed 04/29/2022 with bridging of Lovenox.  INR goal 2.5-3.5, INR still low , cont lovenox 62.'5mg'$  q12h INR 2.7             -antiplatelet therapy: N/A  3. Pain: continue Neurontin 100 mg nightly, RObaxin Lumbar stenosis and scoliosis no relief with tramadol or T#3 aggravated after treadmill training no sig radicular component  4. Mood/Sleep: Provide emotional support             -antipsychotic agents: N/A 5. Neuropsych/cognition: This patient is capable of making decisions on her own behalf. 6. Skin/Wound Care: Routine skin checks 7. Fluids/Electrolytes/Nutrition: Routine in and outs with follow-up chemistries 8.  History of mitral valve replacement 1997.  Resuming Coumadin with bridging of Lovenox 04/29/2022.  Follow-up per cardiology services as needed 9.  Hyperlipidemia.  Continue Lipitor 10.  Hypertension.  Continue Norvasc 5 mg daily Vitals:   05/09/22 1919 05/10/22 0355  BP: 118/74 111/71  Pulse: 96 (!) 109  Resp: 17 18  Temp: 98.1 F (36.7 C) 98.5 F (36.9 C)  SpO2: 96% 97%   Controlled 05/10/22 7/1 Had some spikes in Bp late yesterday, continue monitoring on 5 mg Norvasc daily 7/2 Today bp controlled SBP 100's-110's 11.  Prediabetes.  Hemoglobin A1c 6.1.  Diet controlled/SSI discontinued 12.  History of lumbar spinal stenosis  with increased radicular pain , would not rec reschedule  ESI due to anticoag issues - would rec no treadmill training just  functional ambulation  13.  GERD.  Protonix 14. Insomnia She plans to find out what medication she takes at home for consideration of use at the hospital 15. Hypokalemia  -KCL 25mq x2 , recheck     Latest Ref Rng & Units 05/04/2022    6:10 AM 05/03/2022    6:16 AM 04/30/2022   12:26 AM  BMP  Glucose 70 - 99 mg/dL 122  118  135   BUN 8 - 23 mg/dL '20  19  20   '$ Creatinine 0.44 - 1.00 mg/dL 0.87  0.73  0.75   Sodium 135 - 145  mmol/L 142  138  141   Potassium 3.5 - 5.1 mmol/L 4.1  3.4  3.7   Chloride 98 - 111 mmol/L 106  105  109   CO2 22 - 32 mmol/L '29  28  26   '$ Calcium 8.9 - 10.3 mg/dL 9.7  9.1  9.2    7/2 K+ at 4.1 (6/27), trend with weekly labs 16. Anemia mild  -Continue to monitor    Latest Ref Rng & Units 05/03/2022    6:16 AM 04/30/2022   12:26 AM 04/29/2022    8:38 AM  CBC  WBC 4.0 - 10.5 K/uL 4.4  6.0  5.2   Hemoglobin 12.0 - 15.0 g/dL 11.4  11.9  12.4   Hematocrit 36.0 - 46.0 % 35.8  37.8  37.7   Platelets 150 - 400 K/uL 149  171  158      7/2 Last Hgb 11.4, trend with weekly labs  LOS: 10 days A FACE TO FACE EVALUATION WAS PERFORMED  Luanna Salk Jafeth Mustin 05/10/2022, 8:00 AM

## 2022-05-10 NOTE — Progress Notes (Addendum)
Occupational Therapy Session Note  Patient Details  Name: PATTON SWISHER MRN: [id number] Date of Birth: Nov 22, 1945  Today's Date: 05/10/2022 OT Individual Time: 1430-1500 OT Individual Time Calculation (min): 30 min    Short Term Goals: Week 1:  OT Short Term Goal 1 (Week 1): Pt to be CGA for functional transfers with LRD OT Short Term Goal 2 (Week 1): Pt to be Min A for LB dressing/bathing with LRD OT Short Term Goal 3 (Week 1): Pt to be Min A for toileting tasks with LRD OT Short Term Goal 4 (Week 1): Pt to demonstrate increased coordination to the R UE during functional tasks  Skilled Therapeutic Interventions/Progress Updates:    Upon OT arrival, pt seated in w/c and requests to return to bed "to rest". Pr reports pain 7/10 in back.  Pt completes squat pivot from w/c to bed with Max A and sits EOB for ~12 minutes to manage footwear prior to laying down. Pt sits EOB with CGA and appears to tolerate sitting better this session. Pt requires significant increased time and effort to doff AFO and L shoe with Max A. Pt attempts to doff compression stocking and R shoe but was unsuccessful. Pt requires Total A to remove compression stockings and R shoe. Pt completes sit to supine transfer with Mod A. Pt was positioned comfortably and left with all needs met and safety measures in place. Pt limited by pain and muscle spasms and continues to benefit from OT services to achieve highest level of independence.   Therapy Documentation Precautions:  Precautions Precautions: Fall, Other (comment) Precaution Comments: pt has personal L LE AFO Restrictions Weight Bearing Restrictions: No   Therapy/Group: Individual Therapy  Marvetta Gibbons 05/10/2022, 4:50 PM

## 2022-05-10 NOTE — Discharge Summary (Cosign Needed Addendum)
Physician Discharge Summary  Patient ID: [name] MRN: [id number] DOB/AGE: Jan 10, 1946 76 y.o.  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 EXAM: CT LUMBAR SPINE WITHOUT CONTRAST TECHNIQUE: Multidetector CT imaging of the lumbar spine was performed without intravenous contrast administration. Multiplanar CT image reconstructions were also generated. RADIATION DOSE REDUCTION: This exam was performed according to the departmental dose-optimization program which includes automated exposure control, adjustment of the mA and/or kV according to patient size and/or use of iterative reconstruction technique. COMPARISON:  Lumbar MRI 03/24/2022. CT Abdomen and Pelvis 03/25/2016. FINDINGS: Segmentation: Normal, the same numbering system used on the May MRI. Alignment: Moderate levoconvex lumbar scoliosis with apex at L3. Stable straightening of lumbar lordosis. Vertebrae: No acute osseous abnormality identified. Advanced degenerative endplate changes on the right side at L3-L4. Similar left lateral degenerative endplate changes at W0-J8. Left L5 posterior element degeneration, no fracture corresponding to the area of marrow edema in May. Visible sacrum and SI joints appear intact. Paraspinal and other soft tissues: Chronic simple fluid density right hepatic cyst is partially visible but has increased since 2017, now at least 5 cm diameter, previously 2.8 cm (no follow-up imaging  recommended). bilateral nephrolithiasis. Left extrarenal pelvis. No evidence of acute obstructive uropathy. Calcified aortic atherosclerosis. Lumbar paraspinal soft tissues appears stable since May. Disc levels: Lumbar spine degeneration appears stable to the lumbar MRI in May, see details of that exam. IMPRESSION: 1. No acute osseous abnormality in the lumbar spine. 2. Moderate levoconvex lumbar scoliosis with lumbar spine degeneration stable compared to MRI in May, see details on that exam. 3. Bilateral nephrolithiasis. Aortic Atherosclerosis (ICD10-I70.0). Electronically Signed   By: Genevie Ann M.D.   On: 05/21/2022 06:30   DG Lumbar Spine 2-3 Views  Result Date: 05/11/2022 CLINICAL DATA:  Acute on chronic low back pain. EXAM: LUMBAR SPINE - 2-3 VIEW COMPARISON:  10/26/2019 radiograph and 03/24/2022 MR FINDINGS: Moderate to severe levoconvex thoracolumbar scoliosis again noted. Leftward subluxation of L3 on L4 and L4 on L5 again noted with moderate-severe degenerative disc disease/spondylosis at these levels. Moderate facet arthropathy in the LOWER lumbar spine again noted. No focal bony lesions are present. There is no evidence of acute fracture or acute subluxation. IMPRESSION: 1. No evidence of acute abnormality. 2. Moderate to severe levoconvex thoracolumbar scoliosis with moderate-severe degenerative changes as described. Electronically Signed   By: Margarette Canada M.D.   On: 05/11/2022 10:57    Labs:  Basic Metabolic Panel: No results for input(s): "NA", "K", "CL", "CO2", "GLUCOSE", "BUN", "CREATININE", "CALCIUM", "MG", "PHOS" in the last 168 hours.    CBC: Recent Labs  Lab 05/31/22 0708  WBC 7.7  HGB 11.1*  HCT 35.1*  MCV 85.4  PLT 228    CBG: Recent Labs  Lab 05/26/22 0650  GLUCAP 122*    Family history.  Mother with bone cancer as well as CAD hyperlipidemia and hypertension.  Father with CVA and hypertension.  Denies any colon cancer esophageal cancer or rectal cancer  Brief HPI:    [name] is a 76 y.o. right-handed female with history of  prediabetes, history of mitral valve replacement 1997 maintained on Coumadin, hyperlipidemia hypertension brain surgery with craniotomy right hemispheric CVA with residual left hemiparesis received inpatient rehab services 10/20 as well as subcortical CVA receiving inpatient rehab services 8/21.  Per chart review patient lives with spouse.  Generally modified independent with mobility.  Presented 04/26/2022 with acute onset of slurred speech and altered mental status.  Family denied any recent trauma or fall.  Patient does have a history of chronic hip pain was scheduled to have injection 04/29/2022 thus her Coumadin had been held since 04/23/2022 and she had been placed on Lovenox bridge injections 1 mg/kilogram twice daily administered by her husband.  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  Chronic infarction within the left thalamus and bilateral cerebellar hemispheres.  CT angiogram of head and neck no hemodynamically significant stenosis occlusion dissection or aneurysm.  Admission chemistries unremarkable except glucose 155 BUN 26 hemoglobin A1c 6.1, ammonia levels within normal limits urine drug screen negative.  Hospital course discussed with neurosurgery Dr. Annette Stable with conservative care repeat cranial CT scan 04/27/2022 again showing moderate amount of subarachnoid hemorrhage in the left parietal cortex with slight interval increase.  Small amount of subarachnoid hemorrhage in the right parietal cortex not changed.  Plan on resuming Coumadin with Lovenox bridge 04/29/2022.  Therapy evaluations completed due to patient decreased functional mobility was admitted for a comprehensive rehab program.   Hospital Course: [name] was admitted to rehab 04/30/2022 for  inpatient therapies to consist of PT, ST and OT at least three hours five days a week. Past admission physiatrist, therapy team and rehab RN have worked together to provide customized collaborative inpatient rehab.  Pertaining to patient's SAH secondary to anticoagulation as well as history of CVA x2 with residual left-sided weakness.  Patient remained stable chronic Coumadin for mitral valve replacement had been resumed with Lovenox bridging 04/29/2022 with no bleeding episodes.  She would follow-up for maintenance of Coumadin with [facility].  Pain management history of chronic hip pain maintained on Neurontin initially and discontinued due to some MS changes with trial of Nucynta for neuropathic component.  Valium was added to help with muscle spasms and changed to Robaxin.  Lumbar spine films as well as CT lumbar spine showed no evidence of acute abnormality.  Lipitor for hyperlipidemia.  Blood pressure controlled patient had been on low-dose Norvasc prior to admission resume as needed.  Prediabetes diet controlled hemoglobin A1c 6.1.    Protonix for GERD.   Blood pressures were monitored on TID basis and controlled  Diabetes has been monitored with ac/hs CBG checks and SSI was use prn for tighter BS control.    Rehab course: During patient's stay in rehab weekly team conferences were held to monitor patient's progress, set goals and discuss barriers to discharge. At admission, patient required min assist 150 feet rolling walker minimal assist sit to stand  Physical exam.  Blood pressure 118/70 pulse 80 temperature 98 respirations 18 oxygen saturations 92% room air Constitutional.  No acute distress HEENT Head.  Normocephalic and atraumatic Eyes.  Pupils round and reactive to light no discharge without nystagmus Neck.  Supple nontender no JVD without thyromegaly Cardiac regular rate and rhythm without any extra sounds or murmur heard Abdomen.  Soft nontender positive  bowel sounds without rebound Respiratory effort normal no respiratory distress  without wheeze Skin.  Intact Neurologic.  Alert oriented x3 no acute distress makes eye contact with examiner.  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  Impaired safety awareness.  She was able to partake in simple conversation with some perseveration.  Patient inconsistent to follow two-step commands.  Left upper extremity with increased flexor tone.  Left upper extremity 3/5, right extremity 4/5, 4/5 left lower extremity with AFO, 4+/5RLW  He/She  has had improvement in activity tolerance, balance, postural control as well as ability to compensate for deficits. He/She has had improvement in functional use RUE/LUE  and RLE/LLE as well as improvement in awareness.  Patient did need some encouragement at times to participate.  Initiated rolling to the right slowly and cues.  Needed significant cues for technique with mod max then max assist to come to edge of bed.  Patient provided with wet washcloth to wash her face with minimal assist.  Max assist to scoot to head of bed.  Completes oral care with minimal assist with head of bed elevated.  Again mobility was variable depending on patient's level of participation with therapies.  Patient completing cognitive-linguistic tasks with modified independent supervision level in regards to use of compensatory memory strategies, high-level word finding and written communication.  Family did not feel they can provide the necessary assistance at home and recommendations were for skilled nursing facility.       Disposition: Discharge to home    Diet: Regular  Special Instructions: No driving smoking or alcohol  Follow-up with weekly prothrombin time/INR with goal INR 2.5-3.5  Medications at discharge. 1.  Tylenol as needed 2.  Colace 100 mg nightly 3.  Lipitor 40 mg p.o. daily 4.  Robaxin 500 mg 3 times daily 5.  Nucynta 100 mg every 6 hours as needed  severe pain 6.  Prilosec 20 mg daily 7.  Coumadin 4 mg daily all days except 2 mg on Sundays adjusted accordingly for INR 2.5-3.5 8.  Norvasc 5 mg daily 9.  Calcium 1200 mg daily 10.  Prolia 60 mg injection into the skin every 6 months 11.  Vitamin D-3 1 tablet p.o. daily  30-35 minutes were spent completing discharge summary and discharge planning  Discharge Instructions     Ambulatory referral to Neurology   Complete by: As directed    An appointment is requested in approximately: 4 weeks [place]   Ambulatory referral to Physical Medicine Rehab   Complete by: As directed    Follow-up 1 month SAH.  Patient for skilled nursing facility placement        Follow-up Information     Kirsteins, Luanna Salk, MD Follow up.   Specialty: Physical Medicine and Rehabilitation Why: Office to call for appointment Contact information: [place] [address] [phone]         Nahser, Wonda Cheng, MD Follow up.   Specialty: Cardiology Why: Call for appointment Contact information: [address] [place]. [address] [place] [address] [phone]                 Signed: Lavon Paganini [place] 05/31/2022, 11:33 AM

## 2022-05-11 ENCOUNTER — Inpatient Hospital Stay (HOSPITAL_COMMUNITY): Payer: Medicare Other

## 2022-05-11 MED ORDER — SENNOSIDES-DOCUSATE SODIUM 8.6-50 MG PO TABS
2.0000 | ORAL_TABLET | Freq: Two times a day (BID) | ORAL | Status: DC
  Administered 2022-05-11 – 2022-05-23 (×25): 2 via ORAL
  Filled 2022-05-11 (×26): qty 2

## 2022-05-11 MED ORDER — GABAPENTIN 100 MG PO CAPS
100.0000 mg | ORAL_CAPSULE | Freq: Three times a day (TID) | ORAL | Status: DC
  Administered 2022-05-11 – 2022-05-12 (×4): 100 mg via ORAL
  Filled 2022-05-11 (×4): qty 1

## 2022-05-11 MED ORDER — BISACODYL 10 MG RE SUPP
10.0000 mg | Freq: Every day | RECTAL | Status: DC | PRN
  Administered 2022-05-11: 10 mg via RECTAL
  Filled 2022-05-11: qty 1

## 2022-05-11 NOTE — Progress Notes (Signed)
Md aware of DG lumbar spine results.    Yehuda Mao, LPN

## 2022-05-11 NOTE — Progress Notes (Signed)
Occupational Therapy Session Note  Patient Details  Name: [name] MRN: [id number] Date of Birth: 07-Feb-1946  Today's Date: 05/11/2022 OT Individual Time: 4259-5638 OT Individual Time Calculation (min): 20 min    Short Term Goals: Week 2:  OT Short Term Goal 1 (Week 2): Patient will transition from sit to stand with mod assist to prepare for toilet transfer OT Short Term Goal 2 (Week 2): Patient will don pants over feet with no more than min assist OT Short Term Goal 3 (Week 2): Patient will pull pants over hips with no more than mod assists OT Short Term Goal 4 (Week 2): Patient will maintain standing with BUE in support for 10 seconds with mod assist in preparation for LB ADL  Skilled Therapeutic Interventions/Progress Updates:    Patient received partially sidelying in bed with HOB at 30*.  Patient agreeable to OT.  Assisted patient to sit at edge of bed for bathing and dressing.  Discussed benefit of breathing through movement versus tensing in anticipation of pain.  Moved slowly and incrementally - patient without increased pain during supine to sit transition - max assist.  At this point XRAY arrived for low back imaging.  Will attempt to make up this time later in the day as able.  Patient assisted back to supine - max assist but without increase in pain.    Therapy Documentation Precautions:  Precautions Precautions: Fall, Other (comment) Precaution Comments: pt has personal L LE AFO Restrictions Weight Bearing Restrictions: No General: General OT Amount of Missed Time: 40 Minutes   Pain:  Patient grimacing with all movement.  Worked on breathing versus tensing with transitional movements     Therapy/Group: Individual Therapy  [name] 05/11/2022, 8:59 AM

## 2022-05-11 NOTE — Progress Notes (Signed)
Physical Therapy Session Note  Patient Details  Name: [name] MRN: [id number] Date of Birth: Oct 09, 1946  Today's Date: 05/11/2022 PT Individual Time: 8938-1017 PT Individual Time Calculation (min): 70 min   Short Term Goals: Week 2:  PT Short Term Goal 1 (Week 2): Pt will perform bed mobility with supervision PT Short Term Goal 2 (Week 2): Pt will perform basic transfers with CGA and LRAD PT Short Term Goal 3 (Week 2): Pt will perform gait x 125' with LRAD and CGA PT Short Term Goal 4 (Week 2): Pt will participated in standardized fall risk assessment  Skilled Therapeutic Interventions/Progress Updates:    Pt received lying supine in bed with her husband, [name], present and pt appearing distressed stating she is doing "worse than horrible" and becomes tearful. Therapist provides pt with emotional support. Throughout session pt seems more disoriented and her comments do not make sense in the context of the conversation and pt states she becomes confused. Pt reports she has been told she needs to have a BM. Nurse present to provide suppository so therapist assisted pt into L sidelying with min assist to avoid increased pain and utilized verbal and visual cues more and bedrails.  With encouragement, pt agreeable to progress to OOB mobility; however, pt is very fearful of her low back pain and today reports feeling a "throbbing" type pain in her thighs during mobility; however, pt states "it's hard to tell" having difficulty locating and describing her pain.  Pt transitioned supine>sitting R EOB requiring >49mnutes to complete this due to onset of pain and fear of pain requiring therapist to utilize HThe Polyclinicelevation feature to gradually (~2 degrees at a time) raise her upright because pt would have sudden onset spasms when moved more degrees than this. Pt gritting her teeth and vocalizing pain when spasms occur. Once HOB elevated ~40 degrees, provided max assist for B LE management and trunk upright  via logroll technique for pain management with pt still having onset of spasming pain while rising upright that takes ~2 minutes to settle once EOB. Therapist providing breathing and relaxation cuing throughout for pain management, which appears to help.  Pt requesting to attempt toileting.   Pt able to maintain sitting balance EOB relying on R UE support for pain management to feel supported with supervision for safety.   L stand pivot transfer EOB>BSC with max assist of 1 for lifting into standing with B HHA support on therapist, which allowed forward facilitation of trunk to come to standing and then mod assist for balance while turning requiring several, small steps to complete (no significant posterior lean noted today). Pt does not experience as much pain with standing and transfers compared to supine<>sit transitions.  Continent of bladder and pt passes gas but no BM - nurse notified.  Standing with mod assist of 1 while +2 provided dependent peri-care and LB clothing management.   Pt requesting to lie back down stating "that was a lot" and reports increased low back pain.  Sit>supine with HOB elevated to decrease distance of transitional movement via reverse logroll technique with max assist for B LE management onto bed and pt again having onset of spasming pain that lasted <20seconds.   Therapist used bed pads to assist with repositioning in the bed to avoid increase in pain. Therapist provided pt with paper posted in her room to assist with orientation of date and location.   At end of session, pt left supported upright in bed (HOB ~30degrees) with  knee of the bed bent up to relieve low back pain with needs in reach, bed alarm on, and her husband still present.   Therapy Documentation Precautions:  Precautions Precautions: Fall, Other (comment) Precaution Comments: pt has personal L LE AFO Restrictions Weight Bearing Restrictions: No   Pain: Details above - continues to have  muscle spasm pain with movement that is greatest during supine<>sit transitional movements - premedicated.    Therapy/Group: Individual Therapy  Tawana Scale , PT, DPT, NCS, CSRS 05/11/2022, 7:58 AM

## 2022-05-11 NOTE — Progress Notes (Addendum)
PROGRESS NOTE   Subjective/Complaints:  Pt states she was doing ok with back until she had treadmill training with harness  Intermittent severe back spasms since that time , spoke with PT an pt was ambulated from room to gym , minor stumble , no fall with increased pain  ROS-neg CP, SOB, N/V/D, chills, or fever  Objective:   No results found. No results for input(s): "WBC", "HGB", "HCT", "PLT" in the last 72 hours.  No results for input(s): "NA", "K", "CL", "CO2", "GLUCOSE", "BUN", "CREATININE", "CALCIUM" in the last 72 hours.  No intake or output data in the 24 hours ending 05/11/22 0743       Physical Exam: Vital Signs Blood pressure 124/76, pulse 98, temperature 97.8 F (36.6 C), resp. rate 18, height '5\' 7"'$  (1.702 m), weight 55 kg, SpO2 94 %.    General: No acute distress Mood and affect are appropriate Heart: Regular rate and rhythm no rubs murmurs or extra sounds Lungs: Clear to auscultation, breathing unlabored, no rales or wheezes Abdomen: Positive bowel sounds, soft nontender to palpation, nondistended Extremities: No clubbing, cyanosis, or edema Skin: No evidence of breakdown, no evidence of rash   Neuro: CN II-XII intact, motor strength 4/5 bilateral deltoid, bicep, triceps, grip, hip flexor, knee extensors, ankle dorsiflexors/plantar flexors.  Sensory exam normal sensation to light touch lower extremities.  Speech with minimal dysarthria, sentence level, occasional word finding, but appears at baseline. Musculoskeletal: tenderness to palpation Left lumbar paraspinal area , neg SLR BLE Sensation intact LT Bilateral L3-4-5 S1 dermatomal distribution   Assessment/Plan: 1. Functional deficits which require 3+ hours per day of interdisciplinary therapy in a comprehensive inpatient rehab setting. Physiatrist is providing close team supervision and 24 hour management of active medical problems listed  below. Physiatrist and rehab team continue to assess barriers to discharge/monitor patient progress toward functional and medical goals  Care Tool:  Bathing    Body parts bathed by patient: Right arm, Left arm, Chest, Abdomen, Front perineal area, Buttocks, Right upper leg, Left upper leg, Face   Body parts bathed by helper: Buttocks     Bathing assist Assist Level: Contact Guard/Touching assist     Upper Body Dressing/Undressing Upper body dressing   What is the patient wearing?: Pull over shirt    Upper body assist Assist Level: Minimal Assistance - Patient > 75%    Lower Body Dressing/Undressing Lower body dressing      What is the patient wearing?: Pants, Underwear/pull up     Lower body assist Assist for lower body dressing: Minimal Assistance - Patient > 75%     Toileting Toileting Toileting Activity did not occur (Clothing management and hygiene only): N/A (no void or bm)  Toileting assist Assist for toileting: Contact Guard/Touching assist     Transfers Chair/bed transfer  Transfers assist     Chair/bed transfer assist level: Maximal Assistance - Patient 25 - 49% Chair/bed transfer assistive device: Programmer, multimedia   Ambulation assist      Assist level: Contact Guard/Touching assist Assistive device: Cane-quad Max distance: 211f   Walk 10 feet activity   Assist     Assist level: Contact Guard/Touching assist  Assistive device: Cane-quad   Walk 50 feet activity   Assist    Assist level: Contact Guard/Touching assist Assistive device: Cane-quad    Walk 150 feet activity   Assist Walk 150 feet activity did not occur: Safety/medical concerns         Walk 10 feet on uneven surface  activity   Assist     Assist level: Contact Guard/Touching assist Assistive device: Cane-quad   Wheelchair     Assist Is the patient using a wheelchair?: No (only for time management due to slow gait speed)              Wheelchair 50 feet with 2 turns activity    Assist            Wheelchair 150 feet activity     Assist          Blood pressure 124/76, pulse 98, temperature 97.8 F (36.6 C), resp. rate 18, height '5\' 7"'$  (1.702 m), weight 55 kg, SpO2 94 %.  Medical Problem List and Plan: 1. 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  Antithrombotics: -DVT/anticoagulation: Coumadin resumed 04/29/2022 with bridging of Lovenox.  INR goal 2.5-3.5, INR still low , cont lovenox 62.'5mg'$  q12h INR 2.7             -antiplatelet therapy: N/A  3. Pain: continue Neurontin 100 mg nightly, RObaxin Lumbar stenosis and scoliosis no relief with tramadol or T#3 aggravated after treadmill training no sig radicular component - have change to hydrocodone monitor for increased confusion  Recheck Xray lumbar , known to have severe scoliosis , hx osteoporosis on prolia, consider comp fx No radicular pain or change in exam 4. Mood/Sleep: Provide emotional support             -antipsychotic agents: N/A 5. Neuropsych/cognition: This patient is capable of making decisions on her own behalf. 6. Skin/Wound Care: Routine skin checks 7. Fluids/Electrolytes/Nutrition: Routine in and outs with follow-up chemistries 8.  History of mitral valve replacement 1997.  Resuming Coumadin with bridging of Lovenox 04/29/2022.  Follow-up per cardiology services as needed 9.  Hyperlipidemia.  Continue Lipitor 10.  Hypertension.  Continue Norvasc 5 mg daily Vitals:   05/10/22 1932 05/11/22 0402  BP: 123/75 124/76  Pulse: (!) 103 98  Resp: 17 18  Temp: 98.7 F (37.1 C) 97.8 F (36.6 C)  SpO2: 96% 94%   Controlled 7/4 11.  Prediabetes.  Hemoglobin A1c 6.1.  Diet controlled/SSI  discontinued 12.  History of lumbar spinal stenosis  with increased radicular pain , would not rec reschedule  ESI due to anticoag issues - would rec no treadmill training just functional ambulation  13.  GERD.  Protonix 14. Insomnia She plans to find out what medication she takes at home for consideration of use at the hospital 15. Hypokalemia  -KCL 35mq x2 , recheck     Latest Ref Rng & Units 05/04/2022    6:10 AM 05/03/2022    6:16 AM 04/30/2022   12:26 AM  BMP  Glucose 70 - 99 mg/dL 122  118  135   BUN 8 - 23  mg/dL '20  19  20   '$ Creatinine 0.44 - 1.00 mg/dL 0.87  0.73  0.75   Sodium 135 - 145 mmol/L 142  138  141   Potassium 3.5 - 5.1 mmol/L 4.1  3.4  3.7   Chloride 98 - 111 mmol/L 106  105  109   CO2 22 - 32 mmol/L '29  28  26   '$ Calcium 8.9 - 10.3 mg/dL 9.7  9.1  9.2    7/2 K+ at 4.1 (6/27), trend with weekly labs 16. Anemia mild  -Continue to monitor    Latest Ref Rng & Units 05/03/2022    6:16 AM 04/30/2022   12:26 AM 04/29/2022    8:38 AM  CBC  WBC 4.0 - 10.5 K/uL 4.4  6.0  5.2   Hemoglobin 12.0 - 15.0 g/dL 11.4  11.9  12.4   Hematocrit 36.0 - 46.0 % 35.8  37.8  37.7   Platelets 150 - 400 K/uL 149  171  158      7/2 Last Hgb 11.4, trend with weekly labs  LOS: 11 days A FACE TO [place] Degan Hanser 05/11/2022, 7:43 AM

## 2022-05-12 LAB — URINALYSIS, ROUTINE W REFLEX MICROSCOPIC
Bilirubin Urine: NEGATIVE
Glucose, UA: NEGATIVE mg/dL
Hgb urine dipstick: NEGATIVE
Ketones, ur: NEGATIVE mg/dL
Leukocytes,Ua: NEGATIVE
Nitrite: NEGATIVE
Protein, ur: NEGATIVE mg/dL
Specific Gravity, Urine: 1.005 (ref 1.005–1.030)
pH: 5 (ref 5.0–8.0)

## 2022-05-12 MED ORDER — DIAZEPAM 2 MG PO TABS
2.0000 mg | ORAL_TABLET | Freq: Four times a day (QID) | ORAL | Status: DC | PRN
Start: 2022-05-12 — End: 2022-05-19
  Administered 2022-05-12 – 2022-05-17 (×5): 2 mg via ORAL
  Filled 2022-05-12 (×6): qty 1

## 2022-05-12 MED ORDER — ONDANSETRON HCL 4 MG PO TABS
4.0000 mg | ORAL_TABLET | Freq: Three times a day (TID) | ORAL | Status: DC | PRN
  Administered 2022-05-12 – 2022-05-28 (×7): 4 mg via ORAL
  Filled 2022-05-12 (×7): qty 1

## 2022-05-12 MED ORDER — GABAPENTIN 100 MG PO CAPS
100.0000 mg | ORAL_CAPSULE | Freq: Three times a day (TID) | ORAL | Status: DC
  Administered 2022-05-12 – 2022-05-14 (×8): 100 mg via ORAL
  Filled 2022-05-12 (×8): qty 1

## 2022-05-12 NOTE — Progress Notes (Signed)
Occupational Therapy Session Note  Patient Details  Name: [name] MRN: [id number] Date of Birth: 05-22-1946  Today's Date: 05/12/2022 OT Individual Time: 8871-9597 OT Individual Time Calculation (min): 53 min    Short Term Goals: Week 2:  OT Short Term Goal 1 (Week 2): Patient will transition from sit to stand with mod assist to prepare for toilet transfer OT Short Term Goal 2 (Week 2): Patient will don pants over feet with no more than min assist OT Short Term Goal 3 (Week 2): Patient will pull pants over hips with no more than mod assists OT Short Term Goal 4 (Week 2): Patient will maintain standing with BUE in support for 10 seconds with mod assist in preparation for LB ADL  Skilled Therapeutic Interventions/Progress Updates:  Pt greeted supine in bed with HOB at 23*. Pt reports pain in back but agreeable to OT intervention.       Attempted supine>sit with MAX A however once EOB but reports 10/10 needing to return supine. Pt also experiencing urinary urgency with pt saturating her brief. Pt able to roll R<>L with MOD A with pt needing MAX A for pericare and to don brief. Worked on upright tolerance from supine using bed features. Zero'ed out bed and slowly increased as indicated below: Felt a catch at 15* needing a break. Increased from 15- 21* with pt again needing break d/t pain  Needed a break again at 25* d/t pain, reports pain feels like "pressure"  Able to tolerate an increase from 25- 29 then needed another break before making it to 30*.  Suggested to husband to make sure pt is at 30* for feeding.  Pt left supine in bed at 30* with bed alarm set and all needs within reach.   Therapy Documentation Precautions:  Precautions Precautions: Fall, Other (comment) Precaution Comments: pt has personal L LE AFO Restrictions Weight Bearing Restrictions: No    Pain: 10/10 back pain, heat applied, rest breaks and distraction used as pain mgmt strategies.    Therapy/Group:  Individual Therapy  Corinne Ports [facility] 05/12/2022, 12:22 PM

## 2022-05-12 NOTE — Progress Notes (Signed)
PROGRESS NOTE   Subjective/Complaints: Back pain a little better  Multiple BMs , discussed LS spine imaging ROS-neg CP, SOB, N/V/D, chills, or fever  Objective:   DG Lumbar Spine 2-3 Views  Result Date: 05/11/2022 CLINICAL DATA:  Acute on chronic low back pain. EXAM: LUMBAR SPINE - 2-3 VIEW COMPARISON:  10/26/2019 radiograph and 03/24/2022 MR FINDINGS: Moderate to severe levoconvex thoracolumbar scoliosis again noted. Leftward subluxation of L3 on L4 and L4 on L5 again noted with moderate-severe degenerative disc disease/spondylosis at these levels. Moderate facet arthropathy in the LOWER lumbar spine again noted. No focal bony lesions are present. There is no evidence of acute fracture or acute subluxation. IMPRESSION: 1. No evidence of acute abnormality. 2. Moderate to severe levoconvex thoracolumbar scoliosis with moderate-severe degenerative changes as described. Electronically Signed   By: Margarette Canada M.D.   On: 05/11/2022 10:57   No results for input(s): "WBC", "HGB", "HCT", "PLT" in the last 72 hours.  No results for input(s): "NA", "K", "CL", "CO2", "GLUCOSE", "BUN", "CREATININE", "CALCIUM" in the last 72 hours.   Intake/Output Summary (Last 24 hours) at 05/12/2022 0926 Last data filed at 05/11/2022 1825 Gross per 24 hour  Intake 377 ml  Output --  Net 377 ml         Physical Exam: Vital Signs Blood pressure 125/80, pulse 99, temperature 97.8 F (36.6 C), resp. rate 18, height 5' 7" (1.702 m), weight 55 kg, SpO2 96 %.    General: No acute distress Mood and affect are appropriate Heart: Regular rate and rhythm no rubs murmurs or extra sounds Lungs: Clear to auscultation, breathing unlabored, no rales or wheezes Abdomen: Positive bowel sounds, soft nontender to palpation, nondistended Extremities: No clubbing, cyanosis, or edema Skin: No evidence of breakdown, no evidence of rash   Neuro: CN II-XII intact, motor  strength 4/5 bilateral deltoid, bicep, triceps, grip, hip flexor, knee extensors, ankle dorsiflexors/plantar flexors.  Sensory exam normal sensation to light touch lower extremities.  Speech with minimal dysarthria, sentence level, occasional word finding, but appears at baseline. Musculoskeletal: , neg SLR BLE Sensation intact LT Bilateral L3-4-5 S1 dermatomal distribution   Assessment/Plan: 1. Functional deficits which require 3+ hours per day of interdisciplinary therapy in a comprehensive inpatient rehab setting. Physiatrist is providing close team supervision and 24 hour management of active medical problems listed below. Physiatrist and rehab team continue to assess barriers to discharge/monitor patient progress toward functional and medical goals  Care Tool:  Bathing    Body parts bathed by patient: Right arm, Left arm, Chest, Abdomen, Front perineal area, Buttocks, Right upper leg, Left upper leg, Face   Body parts bathed by helper: Buttocks     Bathing assist Assist Level: Contact Guard/Touching assist     Upper Body Dressing/Undressing Upper body dressing   What is the patient wearing?: Pull over shirt    Upper body assist Assist Level: Minimal Assistance - Patient > 75%    Lower Body Dressing/Undressing Lower body dressing      What is the patient wearing?: Pants, Underwear/pull up     Lower body assist Assist for lower body dressing: Minimal Assistance - Patient > 75%  Toileting Toileting Toileting Activity did not occur Landscape architect and hygiene only): N/A (no void or bm)  Toileting assist Assist for toileting: Contact Guard/Touching assist     Transfers Chair/bed transfer  Transfers assist     Chair/bed transfer assist level: Maximal Assistance - Patient 25 - 49% Chair/bed transfer assistive device: Programmer, multimedia   Ambulation assist      Assist level: Contact Guard/Touching assist Assistive device: Cane-quad Max  distance: 265f   Walk 10 feet activity   Assist     Assist level: Contact Guard/Touching assist Assistive device: Cane-quad   Walk 50 feet activity   Assist    Assist level: Contact Guard/Touching assist Assistive device: Cane-quad    Walk 150 feet activity   Assist Walk 150 feet activity did not occur: Safety/medical concerns         Walk 10 feet on uneven surface  activity   Assist     Assist level: Contact Guard/Touching assist Assistive device: Cane-quad   Wheelchair     Assist Is the patient using a wheelchair?: No (only for time management due to slow gait speed)             Wheelchair 50 feet with 2 turns activity    Assist            Wheelchair 150 feet activity     Assist          Blood pressure 125/80, pulse 99, temperature 97.8 F (36.6 C), resp. rate 18, height 5' 7" (1.702 m), weight 55 kg, SpO2 96 %.  Medical Problem List and Plan: 1. 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 Formulized individual treatment plan based on medical history, underlying problem and comorbidities.              -Continue CIR PT,OT,SLP, change to 15/7  2.  Antithrombotics: -DVT/anticoagulation: Coumadin resumed 04/29/2022 with bridging of Lovenox.  INR goal 2.5-3.5, INR still low , cont lovenox 62.517mq12h INR 2.7             -antiplatelet therapy: N/A  3. Pain: continue Neurontin 100 mg nightly, RObaxin Lumbar stenosis and scoliosis no relief with tramadol or T#3 aggravated after treadmill training no sig radicular component - have change to hydrocodone monitor for increased confusion  Recheck Xray lumbar , known to have  severe scoliosis , hx osteoporosis on prolia, consider comp fx No radicular pain or change in exam 4. Mood/Sleep: Provide emotional support             -antipsychotic agents: N/A 5. Neuropsych/cognition: This patient is capable of making decisions on her own behalf. 6. Skin/Wound Care: Routine skin checks 7. Fluids/Electrolytes/Nutrition: Routine in and outs with follow-up chemistries 8.  History of mitral valve replacement 1997.  Resuming Coumadin with bridging of Lovenox 04/29/2022.  Follow-up per cardiology services as needed 9.  Hyperlipidemia.  Continue Lipitor 10.  Hypertension.  Continue Norvasc 5 mg daily Vitals:   05/11/22 1933 05/12/22 0417  BP: 108/69 125/80  Pulse: 78 99  Resp: 17 18  Temp: 98.3 F (36.8 C) 97.8 F (36.6 C)  SpO2: 95% 96%   Controlled 7/5 11.  Prediabetes.  Hemoglobin A1c 6.1.  Diet controlled/SSI discontinued 12.  History of lumbar spinal stenosis  with increased radicular pain , would not rec reschedule  ESI due to anticoag issues - would rec no treadmill training just functional ambulation  13.  GERD.  Protonix 14. Insomnia She plans to find out what medication she takes at home for consideration of use at the hospital 15. Hypokalemia  -KCL 68mq x2 , recheck     Latest Ref Rng & Units 05/04/2022    6:10 AM 05/03/2022    6:16 AM 04/30/2022   12:26 AM  BMP  Glucose 70 - 99 mg/dL 122  118  135   BUN 8 - 23 mg/dL _0 Creatinine 0.44 - 1.00 mg/dL 0.87  0.73  0.75   Sodium 135 - 145 mmol/L 142  138  141   Potassium 3.5 - 5.1 mmol/L 4.1  3.4  3.7   Chloride 98 - 111 mmol/L 106  105  109   CO2 22 - 32 mmol/L _1 Calcium 8.9 - 10.3 mg/dL 9.7  9.1  9.2    7/2 K+ at 4.1 (6/27), trend with weekly labs 16. Anemia mild  -Continue to monitor    Latest Ref Rng & Units 05/03/2022    6:16 AM 04/30/2022   12:26 AM 04/29/2022    8:38 AM  CBC  WBC 4.0 - 10.5 K/uL 4.4  6.0  5.2   Hemoglobin 12.0 - 15.0 g/dL 11.4  11.9  12.4   Hematocrit 36.0 -  46.0 % 35.8  37.8  37.7   Platelets 150 - 400 K/uL 149  171  158      7/2 Last Hgb 11.4, trend with weekly labs  LOS: 12 days A FACE TO FACE EVALUATION WAS PERFORMED  ACharlett Blake7/03/2022, 9:26 AM

## 2022-05-12 NOTE — Progress Notes (Signed)
[facility] for Warfarin Indication:  mechanical mitral valve  Allergies  Allergen Reactions   Zoloft [Sertraline] Hives, Itching and Rash    Patient Measurements: Height: '5\' 7"'$  (170.2 cm) Weight: 55 kg (121 lb 4.1 oz) IBW/kg (Calculated) : 61.6   Vital Signs: Temp: 97.8 F (36.6 C) (07/05 0417) BP: 125/80 (07/05 0417) Pulse Rate: 99 (07/05 0417)  Labs: Recent Labs    05/10/22 0600  LABPROT 35.0*  INR 3.5*    Estimated Creatinine Clearance: 48.5 mL/min (by C-G formula based on SCr of 0.87 mg/dL).   Assessment: 76 YO female with medical history significant for mMVR in 1997 on warfarin PTA.  Patient presented to the ED with confusion and slurred speech found to have acute SAH. Lovenox / warfarin from PTA was then held on admission and resumed 6/22. Lovenox bridge completed. Pharmacy consulted to manage warfarin during rehab admission.   7/03 INR therapeutic at 3.5. Patient eating 25-75% of meals. No bleeding noted, last CBC 6/26. Next CBC/INR 7/06. Warfarin continued at '5mg'$  PO daily for now.   Goal of Therapy:  INR 2.5-3.5 Monitor platelets by anticoagulation protocol: Yes   Plan:  Continue warfarin 5 mg PO daily Check INR daily while on warfarin Continue to monitor H&H and platelets    Thank you for allowing pharmacy to be a part of this patient's care.  Ardyth Harps, PharmD Clinical Pharmacist

## 2022-05-12 NOTE — Patient Care Conference (Signed)
Inpatient RehabilitationTeam Conference and Plan of Care Update Date: 05/12/2022   Time: 10:34 AM    Patient Name: [name]      Medical Record Number: [id number]  Date of Birth: 25-Jan-1946 Sex: Female         Room/Bed: 4M03C/4M03C-01 Payor Info: Payor: MEDICARE / Plan: MEDICARE PART A AND B / Product Type: *No Product type* /    Admit Date/Time:  04/30/2022  4:11 PM  Primary Diagnosis:  SAH (subarachnoid hemorrhage) [facility])  Hospital Problems: Principal Problem:   SAH (subarachnoid hemorrhage) (HCC) Active Problems:   Subarachnoid hemorrhage [facility])    Expected Discharge Date: Expected Discharge Date: 05/21/22  Team Members Present: Physician leading conference: Dr. Alysia Penna Social Worker Present: Erlene Quan, BSW Nurse Present: Dorien Chihuahua, RN PT Present: Page Spiro, PT OT Present: Willeen Cass, Ludwig Lean, COTA SLP Present: Sherren Kerns, SLP PPS Coordinator present : Gunnar Fusi, SLP     Current Status/Progress Goal Weekly Team Focus  Bowel/Bladder   Had a super Large Bowel Movement.  Regain continence  Assess and assist with toileting needs.   Swallow/Nutrition/ Hydration             ADL's   Change in status.  Max assist for bed mobility and trasnfers due to back pain.  Strong posterior bias in standing, increased muscle tension with all attempts to move.  Max/mod for bathing and dressing tasks.  supervision goals - may need to revisit if back pain persists  BADL Reeducation, functional mobility to tolerance   Mobility   pt had a sudden decline on Thursday 6/29 due to sudden onset low back pain/muscle spasms that has resulted in a significant decline in her functional mobility where she was at CGA overall using quad cane for transfers and gait 177f, but is now requiring max assist supine<>sit using bed features due to the pain and max assist sit<>stand and stand pivot transfers with inability to advance to gait training at this time -  due to this, pt will require a change in POC with longer LOS to ensure a safe D/C plan  supervision overall at ambulatory level transfers - will leave goals at this time until pain improves to determine if a change is necessary  pain management, activity tolerance, pt/family education, bed mobility training, transfer training, sitting balance, standing balance   Communication   min A - decreased thought organization which appears attributed to decreased attention and working memory  mod I - will need to consider downgrade due to recent decline  word finding, thought organization, cont to address cognitive goals including attention and memory to aid communication   Safety/Cognition/ Behavioral Observations  pt exhibited a decline at end of last week with increased pain. Pt is overall slower to process with increased verbal repetition and redirection necessary for perseverating on previous topics/tasks. Pt exhibiting fluctuating orientation to time, increased difficulty with memory, recalling primary therapist. Pt requiring overall mod A  sup A - Anticipate goal downgrade due to recent decline adjusments to POC  recall, attention, basic problem solving   Pain   Denies pain. On scheduled pain meds.  <3/10.  Assess Q4 and prn.   Skin   Acymotic areas, Arms/Abdomen. Otherwise grossly intact  Maintain skin integrity.  Assess Q shift and prn.     Discharge Planning:  Discharging home on Friday with spouse. Neuro OP referral sent   Team Discussion: Patient with left hemiparesis history. Epidural pending with lovenox bridging, ended up with SAH.  Acute back pain after walking in therapy last week. Decline in function since with back pain, urinary urgency; cannot tolerate sitting, posterior bias with standing with decreased ability to focus due to pain and increased anxiety and confusion. Patient on target to meet rehab goals: Currently needs max assist for bed mobility and mod - max assist for bathing and  dressing. Activity intolerance. Goals for discharge set for supervision overall.  *See Care Plan and progress notes for long and short-term goals.   Revisions to Treatment Plan:  Changed to 15/7 therapy prior to event however needs more time for therapy; change back to the regular therapy schedule. MD to address medications for pain, temporary injection for pain control possibility discussed if patient able to lie prone for procedure to treat pain with lumbar stenosis, scoliosis and arthritis.  Downgraded goals for discharge  Teaching Needs: Safety, medications, transfers, toileting, etc   Current Barriers to Discharge: Decreased caregiver support and Incontinence  Possible Resolutions to Barriers: Family education OP follow up services     Medical Summary Current Status: worsened mobility  Barriers to Discharge: Medical stability   Possible Resolutions to Barriers/Weekly Focus: pain management adjustments, establish bowel program   Continued Need for Acute Rehabilitation Level of Care: The patient requires daily medical management by a physician with specialized training in physical medicine and rehabilitation for the following reasons: Direction of a multidisciplinary physical rehabilitation program to maximize functional independence : Yes Medical management of patient stability for increased activity during participation in an intensive rehabilitation regime.: Yes Analysis of laboratory values and/or radiology reports with any subsequent need for medication adjustment and/or medical intervention. : Yes   I attest that I was present, lead the team conference, and concur with the assessment and plan of the team.   Dorien Chihuahua B 05/12/2022, 3:35 PM

## 2022-05-12 NOTE — Progress Notes (Signed)
Slept well this shift. Had a super large BM this morning after administration of suppository and sorbitol yesterday during day shift.. Remains incontinent and not happy about that. Reassured and educated about recovery process. Safety maintained at all times.

## 2022-05-12 NOTE — Progress Notes (Signed)
Patient ID: [name], female   DOB: 12/08/1945, 75 y.o.   MRN: [id number]  Team Conference Report to Patient/Family  Team Conference discussion was reviewed with the patient and caregiver, including goals, any changes in plan of care and target discharge date.  Patient and caregiver express understanding and are in agreement.  The patient has a target discharge date of 05/21/22.  SW met with patient and spouse and provided team conference updates. Spouse happy for extension, no additional questions or concerns.    J  05/12/2022, 12:14 PM  

## 2022-05-12 NOTE — Progress Notes (Signed)
Physical Therapy Session Note  Patient Details  Name: [name] MRN: [id number] Date of Birth: Dec 07, 1945  Today's Date: 05/12/2022 PT Individual Time: 1135-1213 PT Individual Time Calculation (min): 38 min   Short Term Goals: Week 2:  PT Short Term Goal 1 (Week 2): Pt will perform bed mobility with supervision PT Short Term Goal 2 (Week 2): Pt will perform basic transfers with CGA and LRAD PT Short Term Goal 3 (Week 2): Pt will perform gait x 125' with LRAD and CGA PT Short Term Goal 4 (Week 2): Pt will participated in standardized fall risk assessment  Skilled Therapeutic Interventions/Progress Updates:    Pt received supine in bed with blank stare on her face and her husband, [name], present. Pt continues to demonstrate increased confusion with difficulty forming coherent, complex sentences to express her current experience and feelings although this does improve slightly when up on [facility] with pt appearing more alert. Pt states "I don't even feel like I know who I am" when trying to express how she feels upon therapist entering room. Pt does recognize this therapist by name. Pt agreeable to therapy session with plan to perform OOB mobility for toileting.   Pt already with HOB elevated to 30 degrees therefore transitioned from supine>sitting R EOB using bedrail via logroll technique for pain management - pt does demo increased ease of bringing LEs into flexed, hook lying position with no onset of pain today; however, continues to have significant pain when bringing trunk upright requiring max assist for smoothness of movement and therapist providing calming cuing to focus on breathing for pain management. Pt requires max assist for sitting balance for the initial ~77mnute once EOB with continued focus on breathing/relaxataion for pain management prior to progressing to sitting EOB with R UE support on bed and supervision.  Sit>stand with B UE support on therapist's shoulders, gripping therapist's  shirt for pt comfort of support, and max assist for lifting to stand with cuing to continue focusing on breathing for pain management with no severe grimacing/teeth gritting demoing pain during this as is demonstrated during supine<>sit transitions.  Stand pivot EOB<>BSC with B UE support on therapist's shoulders with mod assist for balance, no severe posterior lean noted, pt takes very small steps.   Pt reports R low back pain on BSC as 5/10.  Standing with mod assist while +2 assist provided dependent LB clothing management and peri-care - pt continent of bladder.  Pt rates pain once returned to sitting EOB (after toileting) as 7/10.  Sit>supine, HOB elevated to 30 degrees and using bedrail, with mod assist for B LE management onto bed and pt having sudden grimacing, teeth gritting pain lasting <10 seconds.   Therapist assisted with positioning K-pad on pt's R lower back and hip for pain management. Pt left supine in bed with needs in reach, bed alarm on, towel roll behind her neck for pain management, and her husband present.    Therapy Documentation Precautions:  Precautions Precautions: Fall, Other (comment) Precaution Comments: pt has personal L LE AFO Restrictions Weight Bearing Restrictions: No   Pain:  Details above - increases to 7/10 during session.    Therapy/Group: Individual Therapy  CTawana Scale, PT, DPT, NCS, CSRS 05/12/2022, 7:56 AM

## 2022-05-12 NOTE — Progress Notes (Signed)
Physical Therapy Note  Patient Details  Name: [name] MRN: [id number] Date of Birth: Oct 18, 1946 Today's Date: 05/12/2022    Pt's plan of care adjusted back to 5 out of 7 days after speaking with care team and discussing with MD in team conference to allow pt increased opportunities for OOB mobility based on pt's pain level and required amount of time to advance OOB in order to continue progressing towards LTGs.  Tawana Scale , PT, DPT, NCS, CSRS 05/12/2022, 12:37 PM

## 2022-05-12 NOTE — Progress Notes (Signed)
Speech Language Pathology Daily Session Note  Patient Details  Name: [name] MRN: [id number] Date of Birth: 04/21/1946  Today's Date: 05/12/2022 SLP Individual Time: 1400-1445 SLP Individual Time Calculation (min): 45 min  Short Term Goals: Week 2: SLP Short Term Goal 1 (Week 2): STG=LTG due to ELOS  Skilled Therapeutic Interventions: Skilled ST treatment focused on cognitive goals. Pt was sleeping on arrival and roused to moderate verbal stimuli. Pt remained alert throughout session but reported grogginess. This did not appear to negatively impact participation. Pt requested to leave bed at 30 degree angle to avoid discomfort. SLP was able to modify task accordingly. Pt was perceived as more attention, improved processing, and overall mental clarity as compared to previous ST session on 7/3. SLP facilitated basic-mildly complex problem solving, recall, and sustained attention in novel card game Blink with sup A fading to mod I. Pt made no errors and exhibited effective problem solving and verbal reasoning skills in a timely manner. Patient was left in bed with alarm activated and immediate needs within reach at end of session. Continue per current plan of care.      Pain  None/denied  Therapy/Group: Individual Therapy  Patty Sermons 05/12/2022, 4:40 PM

## 2022-05-13 DIAGNOSIS — M79604 Pain in right leg: Secondary | ICD-10-CM

## 2022-05-13 DIAGNOSIS — Z952 Presence of prosthetic heart valve: Secondary | ICD-10-CM

## 2022-05-13 LAB — CBC
HCT: 34.9 % — ABNORMAL LOW (ref 36.0–46.0)
Hemoglobin: 11 g/dL — ABNORMAL LOW (ref 12.0–15.0)
MCH: 27.5 pg (ref 26.0–34.0)
MCHC: 31.5 g/dL (ref 30.0–36.0)
MCV: 87.3 fL (ref 80.0–100.0)
Platelets: 244 10*3/uL (ref 150–400)
RBC: 4 MIL/uL (ref 3.87–5.11)
RDW: 13.4 % (ref 11.5–15.5)
WBC: 6.4 10*3/uL (ref 4.0–10.5)
nRBC: 0 % (ref 0.0–0.2)

## 2022-05-13 LAB — PROTIME-INR
INR: 4.2 (ref 0.8–1.2)
Prothrombin Time: 40.5 seconds — ABNORMAL HIGH (ref 11.4–15.2)

## 2022-05-13 MED ORDER — WARFARIN SODIUM 2 MG PO TABS
2.0000 mg | ORAL_TABLET | Freq: Once | ORAL | Status: AC
  Administered 2022-05-13: 2 mg via ORAL
  Filled 2022-05-13: qty 1

## 2022-05-13 MED ORDER — WARFARIN SODIUM 2 MG PO TABS: 4.0000 mg | ORAL_TABLET | Freq: Every day | ORAL | Status: DC

## 2022-05-13 MED ORDER — WARFARIN SODIUM 2 MG PO TABS
4.0000 mg | ORAL_TABLET | Freq: Every day | ORAL | Status: DC
  Administered 2022-05-14: 4 mg via ORAL
  Filled 2022-05-13: qty 2

## 2022-05-13 NOTE — Progress Notes (Addendum)
[facility] for Warfarin Indication:  mechanical mitral valve  Allergies  Allergen Reactions   Zoloft [Sertraline] Hives, Itching and Rash    Patient Measurements: Height: '5\' 7"'$  (170.2 cm) Weight: 55 kg (121 lb 4.1 oz) IBW/kg (Calculated) : 61.6   Vital Signs: Temp: 97.8 F (36.6 C) (07/06 0507) BP: 112/70 (07/06 0507) Pulse Rate: 85 (07/06 0507)  Labs: Recent Labs    05/13/22 0523  HGB 11.0*  HCT 34.9*  PLT 244  LABPROT 40.5*  INR 4.2*    Estimated Creatinine Clearance: 48.5 mL/min (by C-G formula based on SCr of 0.87 mg/dL).   Assessment: 76 YO female with medical history significant for mMVR in 1997 on warfarin PTA.  Patient presented to the ED with confusion and slurred speech found to have acute SAH. Lovenox / warfarin from PTA was then held on admission and resumed 6/22. Lovenox bridge completed. Pharmacy consulted to manage warfarin during rehab admission.   7/06 INR increased 3.5 > 4.2 on warfarin '5mg'$  PO daily. Patient eating 25-100% of meals (intermittent charting). No bleeding noted, 7/06 CBC wnl, platelets 244. No signs bleeding reported. Will decrease warfarin slightly.    Goal of Therapy:  INR 2.5-3.5 Monitor platelets by anticoagulation protocol: Yes   Plan:  Give warfarin 2 mg PO x1 today Resume warfarin at reduced 4 mg PO daily starting 7/07 Check INR M/Th while on warfarin Continue to monitor H&H and platelets    Thank you for allowing pharmacy to be a part of this patient's care.  Ardyth Harps, PharmD Clinical Pharmacist

## 2022-05-13 NOTE — Progress Notes (Signed)
Physical Therapy Session Note  Patient Details  Name: [name] MRN: [id number] Date of Birth: 19-Apr-1946  Today's Date: 05/13/2022 PT Individual Time: 0855-1006 PT Individual Time Calculation (min): 71 min   Short Term Goals: Week 2:  PT Short Term Goal 1 (Week 2): Pt will perform bed mobility with supervision PT Short Term Goal 2 (Week 2): Pt will perform basic transfers with CGA and LRAD PT Short Term Goal 3 (Week 2): Pt will perform gait x 125' with LRAD and CGA PT Short Term Goal 4 (Week 2): Pt will participated in standardized fall risk assessment  Skilled Therapeutic Interventions/Progress Updates:    Pt received supine in bed, HOB elevated to 30degrees, with her husband, [name], present and her friend exiting upon therapist arrival. Pt agreeable to therapy session and looks slightly in improved spirits stating she had a goal of getting OOB and dressed today. Notified nurse that pt may require additional pain medication with mobility and discussed with team having pain meds scheduled due to pt's impaired cognition impacting her ability to call for meds when needed.  Supine>sitting R EOB, HOB elevated to 30degrees and using bedrail, via logroll technique for pain management with pt continuing to require >3mnutes to complete this task due to anticipation of pain causing pt to have difficulty motor planning with poor sustained attention (distracting herself from the pain?) - continues to be able to bring her LEs into hooklying position without pain but continues to have severe pain when transitioning trunk to upright requiring max assist for pain management. Continues to benefit from calming, relaxing cues to focus on breathing.  Initially upon sitting EOB, pt requires max assist to maintain balance and for support to manage pain with mild R posterior trunk lean - progressed to maintaining balance with close supervision and pt relying heavily on R UE support (also feel that she is bracing  herself with R UE to avoid increased pain). Pt experiencing gradually increasing pain with further time spent sitting EOB without back support.  Sitting EOB donned shirt, socks, pants, and shoes with total to dependent assist to complete these tasks within a reasonable amount of time and for pain management.  Sit>stand EOB>B HHA on therapist's shirt at shoulder height to allow facilitation of increased anterior trunk lean while assisting pt to rise into standing with max assist - cuing for breathing and slow movements to manage pain with no severe pain like is noticed during supine<>sit transitions.  L stand pivot transfer EOB>BSC with mod assist for balance - takes small steps. Standing with mod assist for balance during dependent assist for LB clothing and peri-care from +2 assist. Incontinent of bladder in brief prior and also continent of bladder on BSC.  [id number] B HHA on therapist's shoulders with mod assist for balance - slow gait speed with short steps.  Sitting in recliner, performed oral care with set-up and mod assist to manage spit basin.  Pt proud that she was able to meet her goal of getting dressed and OOB but still upset she wasn't able to progress to leaving her room today.  At end of session, pt left seated in recliner with needs in reach and her husband present with the nurse aware of pt's position.   Therapy Documentation Precautions:  Precautions Precautions: Fall, Other (comment) Precaution Comments: pt has personal L LE AFO Restrictions Weight Bearing Restrictions: No   Pain: Details above - continues to have most significant pain  during supine<>sit transitions.    Therapy/Group: Individual Therapy  Tawana Scale , PT, DPT, NCS, CSRS 05/13/2022, 7:59 AM

## 2022-05-13 NOTE — Progress Notes (Signed)
Lab called with a critical INR of 4.2.  This LPN called pharmacy and spoke with the pharmacist.  The pharmacist said she would hold the coumadin for today.  Just need to keep check on any possible bleeding.

## 2022-05-13 NOTE — Progress Notes (Signed)
PROGRESS NOTE   Subjective/Complaints: Reports Pain in L thigh for that started a few hours ago.  She got tylenol just before I saw her. She has hydrocodone available if pain does not improve.  INR elevated today, no bleeding at this time.   ROS-neg CP, SOB, N/V/D, cough, chills, or fever  Objective:   DG Lumbar Spine 2-3 Views  Result Date: 05/11/2022 CLINICAL DATA:  Acute on chronic low back pain. EXAM: LUMBAR SPINE - 2-3 VIEW COMPARISON:  10/26/2019 radiograph and 03/24/2022 MR FINDINGS: Moderate to severe levoconvex thoracolumbar scoliosis again noted. Leftward subluxation of L3 on L4 and L4 on L5 again noted with moderate-severe degenerative disc disease/spondylosis at these levels. Moderate facet arthropathy in the LOWER lumbar spine again noted. No focal bony lesions are present. There is no evidence of acute fracture or acute subluxation. IMPRESSION: 1. No evidence of acute abnormality. 2. Moderate to severe levoconvex thoracolumbar scoliosis with moderate-severe degenerative changes as described. Electronically Signed   By: Margarette Canada M.D.   On: 05/11/2022 10:57   Recent Labs    05/13/22 0523  WBC 6.4  HGB 11.0*  HCT 34.9*  PLT 244    No results for input(s): "NA", "K", "CL", "CO2", "GLUCOSE", "BUN", "CREATININE", "CALCIUM" in the last 72 hours.   Intake/Output Summary (Last 24 hours) at 05/13/2022 0802 Last data filed at 05/12/2022 1810 Gross per 24 hour  Intake 320 ml  Output --  Net 320 ml         Physical Exam: Vital Signs Blood pressure 112/70, pulse 85, temperature 97.8 F (36.6 C), resp. rate 18, height '5\' 7"'$  (1.702 m), weight 55 kg, SpO2 95 %.    General: No acute distress Mood and affect are appropriate Heart: Regular rate and rhythm  Lungs: Clear to auscultation, breathing unlabored, no rales or wheezes Abdomen: Positive bowel sounds, soft nontender to palpation, nondistended Extremities: No  clubbing, cyanosis, or edema Skin: No evidence of breakdown, no evidence of rash   Neuro: CN II-XII intact, motor strength 4/5 bilateral deltoid, bicep, triceps, grip, hip flexor, knee extensors, ankle dorsiflexors/plantar flexors.  Sensory exam normal sensation to light touch lower extremities.  Speech with minimal dysarthria, sentence level, occasional word finding, but appears at baseline. Musculoskeletal: , neg SLR BLE No tenderness or joint swelling L hip or thigh Sensation intact LT Bilateral L3-4-5 S1 dermatomal distribution   Assessment/Plan: 1. Functional deficits which require 3+ hours per day of interdisciplinary therapy in a comprehensive inpatient rehab setting. Physiatrist is providing close team supervision and 24 hour management of active medical problems listed below. Physiatrist and rehab team continue to assess barriers to discharge/monitor patient progress toward functional and medical goals  Care Tool:  Bathing    Body parts bathed by patient: Right arm, Left arm, Chest, Abdomen, Front perineal area, Buttocks, Right upper leg, Left upper leg, Face   Body parts bathed by helper: Buttocks     Bathing assist Assist Level: Contact Guard/Touching assist     Upper Body Dressing/Undressing Upper body dressing   What is the patient wearing?: Pull over shirt    Upper body assist Assist Level: Minimal Assistance - Patient > 75%  Lower Body Dressing/Undressing Lower body dressing      What is the patient wearing?: Pants, Underwear/pull up     Lower body assist Assist for lower body dressing: Minimal Assistance - Patient > 75%     Toileting Toileting Toileting Activity did not occur (Clothing management and hygiene only): N/A (no void or bm)  Toileting assist Assist for toileting: Contact Guard/Touching assist     Transfers Chair/bed transfer  Transfers assist     Chair/bed transfer assist level: Maximal Assistance - Patient 25 - 49% Chair/bed  transfer assistive device: Programmer, multimedia   Ambulation assist      Assist level: Contact Guard/Touching assist Assistive device: Cane-quad Max distance: 243f   Walk 10 feet activity   Assist     Assist level: Contact Guard/Touching assist Assistive device: Cane-quad   Walk 50 feet activity   Assist    Assist level: Contact Guard/Touching assist Assistive device: Cane-quad    Walk 150 feet activity   Assist Walk 150 feet activity did not occur: Safety/medical concerns         Walk 10 feet on uneven surface  activity   Assist     Assist level: Contact Guard/Touching assist Assistive device: Cane-quad   Wheelchair     Assist Is the patient using a wheelchair?: No (only for time management due to slow gait speed)             Wheelchair 50 feet with 2 turns activity    Assist            Wheelchair 150 feet activity     Assist          Blood pressure 112/70, pulse 85, temperature 97.8 F (36.6 C), resp. rate 18, height '5\' 7"'$  (1.702 m), weight 55 kg, SpO2 95 %.  Medical Problem List and Plan: 1. 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  Antithrombotics: -DVT/anticoagulation: Coumadin resumed 04/29/2022 with bridging of Lovenox.  INR goal 2.5-3.5, INR still low , cont lovenox 62.'5mg'$  q12h INR 2.7             -antiplatelet therapy: N/A  3. Pain: continue Neurontin 100 mg nightly, RObaxin Lumbar stenosis and scoliosis no relief with tramadol or T#3 aggravated after treadmill training no sig radicular component - have change to hydrocodone monitor for increased confusion  Recheck Xray lumbar , known to have severe scoliosis , hx osteoporosis on prolia, consider comp fx No  radicular pain or change in exam 7/6 Consider increasing gabapentin if pain pain does not improved 4. Mood/Sleep: Provide emotional support             -antipsychotic agents: N/A 5. Neuropsych/cognition: This patient is capable of making decisions on her own behalf. 6. Skin/Wound Care: Routine skin checks 7. Fluids/Electrolytes/Nutrition: Routine in and outs with follow-up chemistries 8.  History of mitral valve replacement 1997.  Resuming Coumadin with bridging of Lovenox 04/29/2022.  Follow-up per cardiology services as needed -7/6 INR above goal, coumadin held today, monitor for bleeding 9.  Hyperlipidemia.  Continue Lipitor 10.  Hypertension.  Continue Norvasc 5 mg daily Vitals:  05/12/22 1951 05/13/22 0507  BP: 100/66 112/70  Pulse: 98 85  Resp: 18 18  Temp: 98.4 F (36.9 C) 97.8 F (36.6 C)  SpO2: 94% 95%   7/6 well controlled 11.  Prediabetes.  Hemoglobin A1c 6.1.  Diet controlled/SSI discontinued 12.  History of lumbar spinal stenosis  with increased radicular pain , would not rec reschedule  ESI due to anticoag issues - would rec no treadmill training just functional ambulation  13.  GERD.  Protonix 14. Insomnia She plans to find out what medication she takes at home for consideration of use at the hospital 15. Hypokalemia  -KCL 9mq x2 , recheck     Latest Ref Rng & Units 05/04/2022    6:10 AM 05/03/2022    6:16 AM 04/30/2022   12:26 AM  BMP  Glucose 70 - 99 mg/dL 122  118  135   BUN 8 - 23 mg/dL '20  19  20   '$ Creatinine 0.44 - 1.00 mg/dL 0.87  0.73  0.75   Sodium 135 - 145 mmol/L 142  138  141   Potassium 3.5 - 5.1 mmol/L 4.1  3.4  3.7   Chloride 98 - 111 mmol/L 106  105  109   CO2 22 - 32 mmol/L '29  28  26   '$ Calcium 8.9 - 10.3 mg/dL 9.7  9.1  9.2    7/2 K+ at 4.1 (6/27), trend with weekly labs 16. Anemia mild  -Continue to monitor    Latest Ref Rng & Units 05/13/2022    5:23 AM 05/03/2022    6:16 AM 04/30/2022   12:26 AM  CBC  WBC 4.0 - 10.5 K/uL 6.4  4.4  6.0    Hemoglobin 12.0 - 15.0 g/dL 11.0  11.4  11.9   Hematocrit 36.0 - 46.0 % 34.9  35.8  37.8   Platelets 150 - 400 K/uL 244  149  171      7/6 HGB 11.0 today, continue to follow with weekly labs  LOS: 13 days A FACE TO FACE EVALUATION WAS PERFORMED  YJennye Boroughs7/04/2022, 8:02 AM

## 2022-05-13 NOTE — Progress Notes (Incomplete)
Inpatient Rehabilitation Discharge Medication Review by a Pharmacist  A complete drug regimen review was completed for this patient to identify any potential clinically significant medication issues.  High Risk Drug Classes Is patient taking? Indication by Medication  Antipsychotic {Receiving?:26196}   Anticoagulant {Receiving?:26196} Warfarin - mMVR  Antibiotic {Receiving?:26196}   Opioid {Receiving?:26196}   Antiplatelet {Receiving?:26196}   Hypoglycemics/insulin {Yes or No?:26198}   Vasoactive Medication {Receiving?:26196}   Chemotherapy {Receiving Chemo?:26197}   Other {Yes or No?:26198} Atorvastatin - HLD Gabapentin - neuropathy Pantoprazole - GERD ppx Senokot-S - constipation     Type of Medication Issue Identified Description of Issue Recommendation(s)  Drug Interaction(s) (clinically significant)     Duplicate Therapy     Allergy     No Medication Administration End Date     Incorrect Dose     Additional Drug Therapy Needed     Significant med changes from prior encounter (inform family/care partners about these prior to discharge). PTA meds: Amlodipine - HTN ASA - stroke prevention Prolia- osteoporosis Prilosec 20 mg daily Communicate medication changes with patient/family at discharge  Other       Clinically significant medication issues were identified that warrant physician communication and completion of prescribed/recommended actions by midnight of the next day:  {Yes or No?:26198}  Name of provider notified for urgent issues identified: ***   Provider Method of Notification: ***    Pharmacist comments: ***   Time spent performing this drug regimen review (minutes): 20   Thank you for allowing pharmacy to be a part of this patient's care.  Ardyth Harps, PharmD Clinical Pharmacist

## 2022-05-13 NOTE — Progress Notes (Signed)
Occupational Therapy Session Note  Patient Details  Name: [name] MRN: [id number] Date of Birth: [birth year]  Today's Date: 05/13/2022 OT Individual Time: 1048-1200 OT Individual Time Calculation (min): 72 min    Short Term Goals: Week 2:  OT Short Term Goal 1 (Week 2): Patient will transition from sit to stand with mod assist to prepare for toilet transfer OT Short Term Goal 2 (Week 2): Patient will don pants over feet with no more than min assist OT Short Term Goal 3 (Week 2): Patient will pull pants over hips with no more than mod assists OT Short Term Goal 4 (Week 2): Patient will maintain standing with BUE in support for 10 seconds with mod assist in preparation for LB ADL  Skilled Therapeutic Interventions/Progress Updates:  Pt greeted seated in recliner, pt agreeable to OT intervention but reports 10/10 pain reporting that recliner had become uncomfortable. Heat, repositioning, rest breaks, deep breathing, gentle stretching all used as pain mgmt strategies.  Attempted sit>stand with cane with pt unable to come fully into a stand despite MAX A efforts, with pt also reporting dizziness: Seated BP - 121/73 ( 88) HR 85  Attempted squat pivot to recliner however pt again crying out in pain tensing up and presenting with what seems to be severe muscle spasms. Pt reports she only had tylenol today, alerted LPN who retrieved pain meds.  In an effort to manage pain did engage pt in seated chair yoga to facilitate deep breathing for pain and work on gentle UB stretching to manage back pain, pt seemed to respond well to slow, controlled rhythmic movement.  Brought in stedy to work on sit>stands from [place], pt needed [place] to come into partial stand but able to lower flaps enough to get pt in stedy, total A transfer to EOB.  Discussed importance of pain mgmt with pt reporting that when she experiences these muscle spasms the pain is worse than child labor, will alert team to attempt to better  manage pain.  Pt left supine in bed with bed alarm activated and all needs within reach.                   Therapy Documentation Precautions:  Precautions Precautions: Fall, Other (comment) Precaution Comments: pt has personal L LE AFO Restrictions Weight Bearing Restrictions: No  Therapy/Group: Individual Therapy  Precious Haws 05/13/2022, 12:25 PM

## 2022-05-13 NOTE — Progress Notes (Signed)
Speech Language Pathology Daily Session Note  Patient Details  Name: [name] MRN: [id number] Date of Birth: 1945-12-01  Today's Date: 05/13/2022 SLP Individual Time: 1302-1400 SLP Individual Time Calculation (min): 58 min  Short Term Goals: Week 2: SLP Short Term Goal 1 (Week 2): STG=LTG due to ELOS  Skilled Therapeutic Interventions: Skilled ST treatment focused on cognitive-communication goals. Pt had just completed noon meal on arrival. Performed self feeding with set-up A. Denied pain at rest. Pt exhibited overall improved processing, attention to task, and cognitive efficiency with intermittent verbal redirection due to instances of tangential speech. Oriented x4. SLP facilitated "solving daily math problems" via the ALFA with 80% accuracy progressing to 100% with min A verbal cues. Pt utilized pen/paper effectively. SLP facilitated working memory and sustained attention by recalling sequential, novel information with 80% accuracy given no cues, progressing to 95% accuracy with sup A verbal cues. At end of task, pt recalled up to 2 novel details without cues, progressing to 10 with sup A semantic cues. Patient was left in bed with alarm activated and immediate needs within reach at end of session. Continue per current plan of care.      Pain  None/denied  Therapy/Group: Individual Therapy  Lynae Pederson T Lavene Penagos 05/13/2022, 1:14 PM

## 2022-05-14 MED ORDER — GABAPENTIN 100 MG PO CAPS
200.0000 mg | ORAL_CAPSULE | Freq: Three times a day (TID) | ORAL | Status: DC
  Administered 2022-05-14 – 2022-05-16 (×8): 200 mg via ORAL
  Filled 2022-05-14 (×8): qty 2

## 2022-05-14 NOTE — Plan of Care (Signed)
  Problem: Consults Goal: RH STROKE PATIENT EDUCATION Description: See Patient Education module for education specifics  Outcome: Progressing   Problem: RH BOWEL ELIMINATION Goal: RH STG MANAGE BOWEL WITH ASSISTANCE Description: STG Manage Bowel with mod I Assistance. Outcome: Progressing Goal: RH STG MANAGE BOWEL W/MEDICATION W/ASSISTANCE Description: STG Manage Bowel with Medication with mod I Assistance. Outcome: Progressing   Problem: RH BLADDER ELIMINATION Goal: RH STG MANAGE BLADDER WITH ASSISTANCE Description: STG Manage Bladder With toileting Assistance Outcome: Progressing   Problem: RH SAFETY Goal: RH STG ADHERE TO SAFETY PRECAUTIONS W/ASSISTANCE/DEVICE Description: STG Adhere to Safety Precautions With cues Assistance/Device. Outcome: Progressing   Problem: RH PAIN MANAGEMENT Goal: RH STG PAIN MANAGED AT OR BELOW PT'S PAIN GOAL Description: At or below level 4 w prns Outcome: Progressing   Problem: RH KNOWLEDGE DEFICIT Goal: RH STG INCREASE KNOWLEDGE OF DIABETES Description: Patient and spouse will be able to manage DM with medications and dietary modifications using handouts and educational resources independently Outcome: Progressing Goal: RH STG INCREASE KNOWLEDGE OF HYPERTENSION Description: Patient and spouse will be able to manage HTN with medications and dietary modifications using handouts and educational resources independently Outcome: Progressing Goal: RH STG INCREASE KNOWLEGDE OF HYPERLIPIDEMIA Description: Patient and spouse will be able to manage HLD with medications and dietary modifications using handouts and educational resources independently Outcome: Progressing Goal: RH STG INCREASE KNOWLEDGE OF STROKE PROPHYLAXIS Description: Patient and spouse will be able to manage secondary risks with medications and dietary modifications using handouts and educational resources independently Outcome: Progressing

## 2022-05-14 NOTE — Progress Notes (Signed)
Speech Language Pathology Weekly Progress and Session Note  Patient Details  Name: [name] MRN: [id number] Date of Birth: 1946/08/06  Beginning of progress report period: May 08, 2022 End of progress report period: May 14, 2022  Today's Date: 05/14/2022 SLP Individual Time: 0903-1000 SLP Individual Time Calculation (min): 57 min  Short Term Goals: Week 2: SLP Short Term Goal 1 (Week 2): STG=LTG due to ELOS  New Short Term Goals: Week 3: SLP Short Term Goal 1 (Week 3): STG=LTG due to ELOS  Weekly Progress Updates: Patient has made slow progress this reporting period and has been limited by pain and decreased cognition likely secondary to recent increase in pain medications with possible sedative effects. Pt was on track for discharge on 7/7 however discharge date was extended due to functional decline and limitations in therapy. Pt is currently completing mildly complex cognitive tasks with supervision-to-min A verbal cues and extended time. Pt has demonstrated improved processing, attention, and cognitive efficiency over the past few days and appears to be approaching baseline prior to recent change in status. Pt exhibits minimal word finding difficulty at the conversation level and is currently mod I for expressing functional needs. Supervision A verbal cues are occasionally necessary for thought organization due to decreased attention and working memory. SLP modified POC based on slower than anticipated progress, and added a problem solving goal. Patient and family education is ongoing. Patient would benefit from continued skilled SLP intervention to maximize cognitive-linguistic functioning and overall functional independence prior to discharge.    Intensity: Minumum of 1-2 x/day, 30 to 90 minutes Frequency: 3 to 5 out of 7 days Duration/Length of Stay: 7/14 Treatment/Interventions: Cognitive remediation/compensation;Cueing hierarchy;Patient/family education;Functional  tasks;Speech/Language facilitation;Internal/external aids;Environmental controls  Daily Session Skilled Therapeutic Interventions: Skilled ST treatment focused on cognitive-communication goals. Pt was semi-reclined in bed and being fed by her spouse upon arrival. Pt was able to elevate HOB to 41 degrees, however unable to achieve greater elevation due to concern for pain/spasms. Pt consumed meal and oral medications without overt s/sx of aspiration. The [facility] Mental Status Examination was completed to evaluate the pt's cognitive-linguistic skills. Pt achieved a score of 17/30 which is below the normal limits of 27 or more out of 30 and is suggestive of at least mild impairment. Pt exhibited difficulty in the following areas: working memory, problem solving, alternating attention, executive functions, calculations. Discussed the impact of language deficits on processing. SLP educated on strategies to enhance memory and attention, as well as compensations for calculations (pen + paper, calculator). Pt/spouse verbalized understanding through teach back. Patient was left in bed with alarm activated and immediate needs within reach at end of session. Continue per current plan of care.      General    Pain Pain Assessment Pain Scale: 0-10 Pain Score: 3  Pain Location: BackNone/denied  Therapy/Group: Individual Therapy  Patty Sermons 05/14/2022, 3:49 PM

## 2022-05-14 NOTE — Plan of Care (Signed)
  Problem: RH Memory Goal: LTG Patient will use memory compensatory aids to (SLP) Description: LTG:  Patient will use memory compensatory aids to recall biographical/new, daily complex information with cues (SLP) Flowsheets (Taken 05/14/2022 1258) LTG: Patient will use memory compensatory aids to (SLP): Minimal Assistance - Patient > 75% Note: Goal downgraded due to slower than anticipated progress and fluctuations.   Problem: RH Problem Solving Goal: LTG Patient will demonstrate problem solving for (SLP) Description: LTG:  Patient will demonstrate problem solving for basic/complex daily situations with cues  (SLP) Flowsheets (Taken 05/14/2022 1259) LTG Patient will demonstrate problem solving for: Minimal Assistance - Patient > 75%

## 2022-05-14 NOTE — Progress Notes (Incomplete)
Inpatient Rehabilitation Discharge Medication Review by a Pharmacist  A complete drug regimen review was completed for this patient to identify any potential clinically significant medication issues.  High Risk Drug Classes Is patient taking? Indication by Medication  Antipsychotic {Receiving?:26196}   Anticoagulant {Receiving?:26196} Warfarin - mMVR  Antibiotic {Receiving?:26196}   Opioid {Receiving?:26196}   Antiplatelet {Receiving?:26196}   Hypoglycemics/insulin {Yes or No?:26198}   Vasoactive Medication {Receiving?:26196}   Chemotherapy {Receiving Chemo?:26197}   Other {Yes or No?:26198} Atorvastatin - HLD Gabapentin - neuropathy Pantoprazole - GERD ppx Senokot-S - constipation     Type of Medication Issue Identified Description of Issue Recommendation(s)  Drug Interaction(s) (clinically significant)     Duplicate Therapy     Allergy     No Medication Administration End Date     Incorrect Dose     Additional Drug Therapy Needed     Significant med changes from prior encounter (inform family/care partners about these prior to discharge). PTA meds not continued at discharge: ***  Amlodipine - HTN ASA - stroke prevention Prolia- osteoporosis Prilosec 20 mg daily Communicate medication changes with patient/family at discharge  Other       Clinically significant medication issues were identified that warrant physician communication and completion of prescribed/recommended actions by midnight of the next day:  {Yes or No?:26198}  Name of provider notified for urgent issues identified: ***   Provider Method of Notification: ***    Pharmacist comments: ***   Time spent performing this drug regimen review (minutes): 20   Thank you for allowing pharmacy to be a part of this patient's care.  ***

## 2022-05-14 NOTE — Progress Notes (Signed)
Occupational Therapy Session Note  Patient Details  Name: [name] MRN: [id number] Date of Birth: [birth year]  Today's Date: 05/14/2022 OT Individual Time: 1300-1345 OT Individual Time Calculation (min): 45 min    Short Term Goals: Week 2:  OT Short Term Goal 1 (Week 2): Patient will transition from sit to stand with mod assist to prepare for toilet transfer OT Short Term Goal 2 (Week 2): Patient will don pants over feet with no more than min assist OT Short Term Goal 3 (Week 2): Patient will pull pants over hips with no more than mod assists OT Short Term Goal 4 (Week 2): Patient will maintain standing with BUE in support for 10 seconds with mod assist in preparation for LB ADL  Skilled Therapeutic Interventions/Progress Updates:    Patient agreeable to participate in OT session. Reports low back pain that expands the width of her back. .   Patient participated in skilled OT session focusing on functional compensatory strategies while managing back pain in order to participate in functional toilet transfer and toileting activity. Therapist integrated patient's preference of slow progressive movement while working towards ability to transfer to toilet with [facility] use for elevation in order to successfully completing desired toileting task. Therapist pushed recliner into bathroom up to grab bar near toilet. Pt educated to hold bars and stand (squat stand if able). While pt focused on her standing tolerance, therapist provided assist to pull pants up and/or down; depending on which aspect of toileting was performed. Due to pt's decreased standing tolerance and pain level, activity was modified to be completed in slow, segmented steps until fully complete. Pt encouraged to commmunicate any discomfort during activity to allow therapist to provide modifications. Pt performed ~5-6 segmented sit to squat stands while therapist worked on [place] down. Pt was able to side step and turn to sit on  toilet when provided with Min A. Pt was provided with wet washcloth to attempt hygiene after toileting. Therapist completed hygiene after to assess thoroughness. Utilized same technique to return to recliner and assist to pull clothing up and over hips. Therapist provided verbal cues throughout session for form and technique as well as to provide reassurance and increase patient's comfort level while working with new OT. Pt extremely grateful of the help she was provided by OT.      Therapy Documentation Precautions:  Precautions Precautions: Fall, Other (comment) Precaution Comments: pt has personal L LE AFO Restrictions Weight Bearing Restrictions: No   Therapy/Group: Individual Therapy  Ailene Ravel, OTR/L,CBIS  Supplemental OT - [facility] and [facility]  05/14/2022, 8:00 AM

## 2022-05-14 NOTE — Progress Notes (Signed)
Physical Therapy Session Note  Patient Details  Name: [name] MRN: [id number] Date of Birth: [birth year]  Today's Date: 05/14/2022 PT Individual Time: 1350-1415 PT Individual Time Calculation (min): 25 min   Short Term Goals: Week 2:  PT Short Term Goal 1 (Week 2): Pt will perform bed mobility with supervision PT Short Term Goal 2 (Week 2): Pt will perform basic transfers with CGA and LRAD PT Short Term Goal 3 (Week 2): Pt will perform gait x 125' with LRAD and CGA PT Short Term Goal 4 (Week 2): Pt will participated in standardized fall risk assessment  Skilled Therapeutic Interventions/Progress Updates: Pt presented in recliner agreeable to therapy with encouragement. Pt did not rate pain but expressed pain in back but was "manageable" at current time. Session focused on block practice initiation of forward flexion and anterior weight shifting during Sit to stand. With increased time pt was able to perform anterior lean towards PTA and using arm rests performed "push offs" from recliner to at minimum have buttocks clear from recliner. Pt was able to perform x 4 with modA and max multimodal cues for anterior weight shifting. Pt then performed Sit to stand using same technique with modA and strong posterior lean. PTA was able to facilitate at hips increased anterior translation and pt was able to place hands on PTA's forearms. Pt then performed step pivot transfer to bed with modA. At EOB performed sit to supine with modA via log roll technique with pt having significant back spasm as PTA placed BLE on bed. With verbal cues and increased time pt was able to complete log roll to supine with minA. Pt attempted to boost self up to [facility] with PTA stabilizing B feet however unable to move more than an inch or two therefore had pt's husband stabilize feet and pt was able to perform again with PTA assisting with use of draw sheet. Pt was repositioned to comfort and left with bed alarm on, call bell within  reach and needs met.      Therapy Documentation Precautions:  Precautions Precautions: Fall, Other (comment) Precaution Comments: pt has personal L LE AFO Restrictions Weight Bearing Restrictions: No General:   Vital Signs: Therapy Vitals Temp: 97.8 F (36.6 C) Pulse Rate: 100 Resp: 15 BP: 118/68 Patient Position (if appropriate): Lying Oxygen Therapy SpO2: 97 % O2 Device: Room Air Pain: Pain Assessment Pain Scale: 0-10 Pain Score: 3  Pain Location: Back Mobility:   Locomotion :    Trunk/Postural Assessment :    Balance:   Exercises:   Other Treatments:      Therapy/Group: Individual Therapy  Tyrika Newman 05/14/2022, 4:28 PM

## 2022-05-14 NOTE — Progress Notes (Signed)
PROGRESS NOTE   Subjective/Complaints: Continues to have episodes of increased pain, improved with hydrocodone.   ROS-neg CP, SOB, N/V/D, cough, chills, or HA  Objective:   No results found. Recent Labs    05/13/22 0523  WBC 6.4  HGB 11.0*  HCT 34.9*  PLT 244     No results for input(s): "NA", "K", "CL", "CO2", "GLUCOSE", "BUN", "CREATININE", "CALCIUM" in the last 72 hours.   Intake/Output Summary (Last 24 hours) at 05/14/2022 0920 Last data filed at 05/13/2022 1803 Gross per 24 hour  Intake 476 ml  Output --  Net 476 ml         Physical Exam: Vital Signs Blood pressure 132/71, pulse 94, temperature (!) 97.5 F (36.4 C), temperature source Oral, resp. rate 18, height '5\' 7"'$  (7.829 m), weight 55 kg, SpO2 92 %.    General: No acute distress. Appears uncomfortable,she says she is waiting on pain medications this AM. Mood and affect are appropriate Heart: Regular rate and rhythm  Lungs: Clear to auscultation, breathing unlabored, no rales or wheezes Abdomen: Positive bowel sounds, soft nontender to palpation, nondistended Extremities: No clubbing, cyanosis, or edema Skin: No evidence of breakdown, no evidence of rash   Neuro: CN II-XII intact, motor strength 4/5 bilateral deltoid, bicep, triceps, grip, hip flexor, knee extensors, ankle dorsiflexors/plantar flexors.  Sensory exam normal sensation to light touch lower extremities.  Speech with minimal dysarthria, sentence level, occasional word finding, but appears at baseline. Musculoskeletal: , neg SLR BLE No tenderness or joint swelling L hip or thigh Sensation intact LT Bilateral L3-4-5 S1 dermatomal distribution   Assessment/Plan: 1. Functional deficits which require 3+ hours per day of interdisciplinary therapy in a comprehensive inpatient rehab setting. Physiatrist is providing close team supervision and 24 hour management of active medical problems listed  below. Physiatrist and rehab team continue to assess barriers to discharge/monitor patient progress toward functional and medical goals  Care Tool:  Bathing    Body parts bathed by patient: Right arm, Left arm, Chest, Abdomen, Front perineal area, Buttocks, Right upper leg, Left upper leg, Face   Body parts bathed by helper: Buttocks     Bathing assist Assist Level: Contact Guard/Touching assist     Upper Body Dressing/Undressing Upper body dressing   What is the patient wearing?: Pull over shirt    Upper body assist Assist Level: Minimal Assistance - Patient > 75%    Lower Body Dressing/Undressing Lower body dressing      What is the patient wearing?: Pants, Underwear/pull up     Lower body assist Assist for lower body dressing: Minimal Assistance - Patient > 75%     Toileting Toileting Toileting Activity did not occur (Clothing management and hygiene only): N/A (no void or bm)  Toileting assist Assist for toileting: Contact Guard/Touching assist     Transfers Chair/bed transfer  Transfers assist     Chair/bed transfer assist level: Maximal Assistance - Patient 25 - 49% Chair/bed transfer assistive device: Programmer, multimedia   Ambulation assist      Assist level: Contact Guard/Touching assist Assistive device: Cane-quad Max distance: 238f   Walk 10 feet activity   Assist  Assist level: Contact Guard/Touching assist Assistive device: Cane-quad   Walk 50 feet activity   Assist    Assist level: Contact Guard/Touching assist Assistive device: Cane-quad    Walk 150 feet activity   Assist Walk 150 feet activity did not occur: Safety/medical concerns         Walk 10 feet on uneven surface  activity   Assist     Assist level: Contact Guard/Touching assist Assistive device: Cane-quad   Wheelchair     Assist Is the patient using a wheelchair?: No (only for time management due to slow gait speed)              Wheelchair 50 feet with 2 turns activity    Assist            Wheelchair 150 feet activity     Assist          Blood pressure 132/71, pulse 94, temperature (!) 97.5 F (36.4 C), temperature source Oral, resp. rate 18, height '5\' 7"'$  (1.702 m), weight 55 kg, SpO2 92 %.  Medical Problem List and Plan: 1. 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  Antithrombotics: -DVT/anticoagulation: Coumadin resumed 04/29/2022 with bridging of Lovenox.  INR goal 2.5-3.5, INR still low , cont lovenox 62.'5mg'$  q12h INR 2.7             -antiplatelet therapy: N/A  3. Pain: continue Neurontin 100 mg nightly, RObaxin Lumbar stenosis and scoliosis no relief with tramadol or T#3 aggravated after treadmill training no sig radicular component - have change to hydrocodone monitor for increased confusion  Recheck Xray lumbar , known to have severe scoliosis , hx osteoporosis on prolia, consider comp fx No radicular pain or change in exam 7/7 increase gabapentin to '200mg'$  QID 4. Mood/Sleep: Provide emotional support             -antipsychotic agents: N/A 5. Neuropsych/cognition: This patient is capable of making decisions on her own behalf. 6. Skin/Wound Care: Routine skin checks 7. Fluids/Electrolytes/Nutrition: Routine in and outs with follow-up chemistries 8.  History of mitral valve replacement 1997.  Resuming Coumadin with bridging of Lovenox 04/29/2022.  Follow-up per cardiology services as needed -7/6 INR above goal, coumadin held today, monitor for bleeding 7/7 pharmacy adjusting coumadin dose 9.  Hyperlipidemia.  Continue Lipitor 10.  Hypertension.  Continue Norvasc 5 mg daily Vitals:    05/13/22 1440 05/14/22 0301  BP: 109/65 132/71  Pulse: 94 94  Resp: 17 18  Temp: 97.9 F (36.6 C) (!) 97.5 F (36.4 C)  SpO2: 97% 92%   7/7 well controlled, follow 11.  Prediabetes.  Hemoglobin A1c 6.1.  Diet controlled/SSI discontinued 12.  History of lumbar spinal stenosis  with increased radicular pain , would not rec reschedule  ESI due to anticoag issues - would rec no treadmill training just functional ambulation  13.  GERD.  Protonix 14. Insomnia She plans to find out what medication she takes at home for consideration of use at the hospital 15. Hypokalemia  -KCL 74mq x2 , recheck  Latest Ref Rng & Units 05/04/2022    6:10 AM 05/03/2022    6:16 AM 04/30/2022   12:26 AM  BMP  Glucose 70 - 99 mg/dL 122  118  135   BUN 8 - 23 mg/dL '20  19  20   '$ Creatinine 0.44 - 1.00 mg/dL 0.87  0.73  0.75   Sodium 135 - 145 mmol/L 142  138  141   Potassium 3.5 - 5.1 mmol/L 4.1  3.4  3.7   Chloride 98 - 111 mmol/L 106  105  109   CO2 22 - 32 mmol/L '29  28  26   '$ Calcium 8.9 - 10.3 mg/dL 9.7  9.1  9.2    7/2 K+ at 4.1 (6/27), trend with weekly labs 16. Anemia mild  -Continue to monitor    Latest Ref Rng & Units 05/13/2022    5:23 AM 05/03/2022    6:16 AM 04/30/2022   12:26 AM  CBC  WBC 4.0 - 10.5 K/uL 6.4  4.4  6.0   Hemoglobin 12.0 - 15.0 g/dL 11.0  11.4  11.9   Hematocrit 36.0 - 46.0 % 34.9  35.8  37.8   Platelets 150 - 400 K/uL 244  149  171      7/6 HGB 11.0 today, continue to follow with weekly labs  LOS: 14 days A FACE TO FACE EVALUATION WAS PERFORMED  Jennye Boroughs 05/14/2022, 9:20 AM

## 2022-05-14 NOTE — Progress Notes (Signed)
Patient ID: [name], female   DOB: 11-Mar-1946, 76 y.o.   MRN: [id number]  Sw followed up with patient and spouse in the room. No questions or concerns.

## 2022-05-14 NOTE — Progress Notes (Signed)
Occupational Therapy Session Note  Patient Details  Name: [name] MRN: [id number] Date of Birth: [birth year]  Today's Date: 05/14/2022 OT Individual Time: 8182-9937 OT Individual Time Calculation (min): 62 min    Short Term Goals: Week 2:  OT Short Term Goal 1 (Week 2): Patient will transition from sit to stand with mod assist to prepare for toilet transfer OT Short Term Goal 2 (Week 2): Patient will don pants over feet with no more than min assist OT Short Term Goal 3 (Week 2): Patient will pull pants over hips with no more than mod assists OT Short Term Goal 4 (Week 2): Patient will maintain standing with BUE in support for 10 seconds with mod assist in preparation for LB ADL  Skilled Therapeutic Interventions/Progress Updates:    Patient received supine in bed finishing breakfast.  Husband present.  Patient reports bed is wet.  Assisted patient to change into dry brief and wash herself, then transition to sitting at edge of bed.  Patient with strong posterior right lean in sitting, and then squealing with pain.  Worked on gentle rocking forward - encouraging hip flexion in sitting.  Worked on incrementally increasing amount of forward hinge at hip as needed for dressing.  Patient unable to dress herself in unsupported sitting due to poor postural control and back pain.  Patient transferred to recliner - max assist squat pivot.  Able to flex forward better in confines of recliner.  Able to reach forward to don pants over feet.  Encouraged further tasks which required forward reach - turning on/off water, reaching for grooming items.  Used distraction to reduce emphasis on complaints or pain.  Encouraged focus on positive factors - family and friends who support her.  Patient became tearful, but then able to talk about less negative topics.   Patient taken in recliner to gym to practice sit to stand facing parallel bars (simulating grab bars)  Patient abel to transition from sit to stand after  set up without physical assistance, and then worked on improving midline orientation, and hip/knee/trunk extension in standing.   Returned to room in recliner with goal to sit up at least 1 hour.  Husband in room.  Personal items in reach.    Therapy Documentation Precautions:  Precautions Precautions: Fall, Other (comment) Precaution Comments: pt has personal L LE AFO Restrictions Weight Bearing Restrictions: No  Pain:  Patient winces before movement occurs.  Encouraged breathing through movement.   Pain not scored - appears significant with transitional movements.       Therapy/Group: Individual Therapy  [name] 05/14/2022, 12:26 PM

## 2022-05-15 LAB — PROTIME-INR
INR: 2.5 — ABNORMAL HIGH (ref 0.8–1.2)
Prothrombin Time: 27.1 seconds — ABNORMAL HIGH (ref 11.4–15.2)

## 2022-05-15 MED ORDER — WARFARIN SODIUM 2.5 MG PO TABS
2.5000 mg | ORAL_TABLET | Freq: Once | ORAL | Status: AC
  Administered 2022-05-16: 2.5 mg via ORAL
  Filled 2022-05-15: qty 1

## 2022-05-15 MED ORDER — WARFARIN SODIUM 5 MG PO TABS
5.0000 mg | ORAL_TABLET | Freq: Once | ORAL | Status: AC
  Administered 2022-05-15: 5 mg via ORAL
  Filled 2022-05-15: qty 1

## 2022-05-15 NOTE — Progress Notes (Signed)
Speech Language Pathology Daily Session Note  Patient Details  Name: LACOLE KOMOROWSKI MRN: [id number] Date of Birth: 03/30/1946  Today's Date: 05/15/2022 SLP Individual Time: 1304-1400 SLP Individual Time Calculation (min): 56 min  Short Term Goals: Week 2: SLP Short Term Goal 1 (Week 2): STG=LTG due to ELOS  Skilled Therapeutic Interventions:Skilled ST services focused on cognitive skills. SLP facilitated word finding, problem solving and error awareness in picture card sequencing tasks. Pt demonstrated mod I to sequence 3 step pictures cards and min A fade to supervision A to sequence 4 step picture cards due to reduced error awareness. Pt demonstrated word finding in picture description tasks with min A fade to supervision A semantic and error awareness cues with ability to self-correct majority of errors. Pt requested to use the bed pan and SLP/NT assisted. Pt was able to void a BM. Pt was left in room with call bell within reach and bed alarm set. SLP recommends to continue skilled services.     Pain Pain Assessment Pain Score: 0-No pain  Therapy/Group: Individual Therapy  Susanne Baumgarner  [facility] 05/15/2022, 3:13 PM

## 2022-05-15 NOTE — Progress Notes (Signed)
Physical Therapy Session Note  Patient Details  Name: [name] MRN: [id number] Date of Birth: 10-27-1946  Today's Date: 05/15/2022 PT Individual Time: 6789-3810 PT Individual Time Calculation (min): 62 min   Short Term Goals: Week 2:  PT Short Term Goal 1 (Week 2): Pt will perform bed mobility with supervision PT Short Term Goal 2 (Week 2): Pt will perform basic transfers with CGA and LRAD PT Short Term Goal 3 (Week 2): Pt will perform gait x 125' with LRAD and CGA PT Short Term Goal 4 (Week 2): Pt will participated in standardized fall risk assessment  Skilled Therapeutic Interventions/Progress Updates:   Pt received supine in bed resting, reporting she is "out of it" and pt stating she thinks it is 4:30AM as opposed to PM despite the nurse having reoriented her shortly prior to therapist's arrival. Pt agreeable to therapy session with goal of getting outside today. In supine, donned pt's personal compression socks total assist.   Supine>sitting R EOB, HOB elevated ~28degrees and using bedrail, via logroll technique to manage pt's pain with max assist primarily to bring trunk upright due to onset of pain - continues to require slow movements with cuing to focus on her breathing during this transition - has onset of pain when bringing trunk upright but appears to be slightly less than last time seen by this therapist.  Sitting EOB, requires consistent max assist for ~37mnutes due to strong R posterior lean - transitioned to providing pt dependent trunk support for ~245mutes while focusing on gradual L anterior trunk lean to reach midline. After than, pt able to progress to maintaining sitting balance with intermittent min/mod assist relying on R UE support, while therapist threads on her pants and dons shoes with L LE PLS AFO total assist. Pt noticed to have a much more drastic R lateral trunk flexion spine posturing and R-posterolateral trunk lean today with decreased visual scanning towards  L despite therapist being on that side - this all may be associated with increased time spent in the bed.  Sit>stand EOB>B HHA on therapist's shoulders to promote anterior trunk lean as pt demonstrating more severe R posterior lean today - requires heavy max assist to maintain balance and lift into standing due to strong posterior lean. L stand pivot EOB>BSC with heavy mod assist for balance while taking small steps to transfer due to continued posterior lean.  Standing with heavy mod assist of 1 while +2 provided dependent assist LB clothing management and peri-care - pt continent of bladder.  [facility] and L stand pivot to w/c with heavy mod assist of 1. Transported pt outside in w/c for change of scenery to promote increased mood and focused on L visual scanning and orientation as pt tends to keep cervical spine rotated towards R today.   Transported back to room and discussed with nursing, pt's request to remain sitting up in w/c for meal - nurse felt safe with this. Pt left seated in w/c with needs in reach, seat belt alarm on, and meal tray set-up.    Therapy Documentation Precautions:  Precautions Precautions: Fall, Other (comment) Precaution Comments: pt has personal L LE AFO Restrictions Weight Bearing Restrictions: No   Pain: Continues to have low back pain primarily with transitioning from supine>sitting EOB; however, pain appears to be slightly less than when last seen by this therapist - premedicated in preparation for therapy.   Therapy/Group: Individual Therapy  Finbar Nippert M Francis Dowse PT, DPT,  NCS, CSRS 05/15/2022, 7:12 PM

## 2022-05-15 NOTE — Progress Notes (Signed)
ANTICOAGULATION CONSULT NOTE  Pharmacy Consult for Warfarin Indication:  mechanical mitral valve  Allergies  Allergen Reactions   Zoloft [Sertraline] Hives, Itching and Rash    Patient Measurements: Height: '5\' 7"'$  (170.2 cm) Weight: 55.4 kg (122 lb 2.2 oz) IBW/kg (Calculated) : 61.6   Vital Signs:    Labs: Recent Labs    05/13/22 0523 05/15/22 0721  HGB 11.0*  --   HCT 34.9*  --   PLT 244  --   LABPROT 40.5* 27.1*  INR 4.2* 2.5*     Estimated Creatinine Clearance: 48.9 mL/min (by C-G formula based on SCr of 0.87 mg/dL).   Assessment: 76 YO female with medical history significant for mMVR in 1997 on warfarin PTA.  Patient presented to the ED with confusion and slurred speech found to have acute SAH. Lovenox / warfarin from PTA was then held on admission and resumed 6/22. Lovenox bridge completed. Pharmacy consulted to manage warfarin during rehab admission.   7/06 INR increased 3.5 > 4.2 on warfarin '5mg'$  PO daily, now INR 2.5 on low end of goal with reduced dose. Patient eating 25-100% of meals (intermittent charting). No bleeding noted, 7/06 CBC wnl, platelets 244. No signs bleeding reported. Will increase warfarin slightly.   Goal of Therapy:  INR 2.5-3.5 Monitor platelets by anticoagulation protocol: Yes   Plan:  Give warfarin 5 mg PO x1 today Warfarin 2.5 mg PO x1 on Sun Check INR M/Th while on warfarin Continue to monitor H&H and platelets    Thank you for involving pharmacy in this patient's care.  Renold Genta, PharmD, BCPS Clinical Pharmacist Clinical phone for 05/15/2022 until 3p is [phone] 05/15/2022 8:54 AM

## 2022-05-15 NOTE — Progress Notes (Signed)
PROGRESS NOTE   Subjective/Complaints: Continues to have episodes of increased pain, improved with hydrocodone.  Sleeping this am but awakens easily to voice   ROS-neg CP, SOB, N/V/D, cough, chills, or HA  Objective:   No results found. Recent Labs    05/13/22 0523  WBC 6.4  HGB 11.0*  HCT 34.9*  PLT 244     No results for input(s): "NA", "K", "CL", "CO2", "GLUCOSE", "BUN", "CREATININE", "CALCIUM" in the last 72 hours.   Intake/Output Summary (Last 24 hours) at 05/15/2022 1107 Last data filed at 05/15/2022 0100 Gross per 24 hour  Intake 120 ml  Output --  Net 120 ml         Physical Exam: Vital Signs Blood pressure 101/64, pulse 98, temperature (!) 97.5 F (36.4 C), resp. rate 17, height '5\' 7"'$  (1.702 m), weight 55.4 kg, SpO2 96 %.    General: No acute distress. Appears uncomfortable,she says she is waiting on pain medications this AM. Mood and affect are appropriate Heart: Regular rate and rhythm  Lungs: Clear to auscultation, breathing unlabored, no rales or wheezes Abdomen: Positive bowel sounds, soft nontender to palpation, nondistended Extremities: No clubbing, cyanosis, or edema Skin: No evidence of breakdown, no evidence of rash   Neuro: CN II-XII intact, motor strength 4/5 bilateral deltoid, bicep, triceps, grip, hip flexor, knee extensors, ankle dorsiflexors/plantar flexors.  Sensory exam normal sensation to light touch lower extremities.  Speech with minimal dysarthria, sentence level, occasional word finding, but appears at baseline. Musculoskeletal: , neg SLR BLE No tenderness or joint swelling L hip or thigh, pain to palp Left lumbar paraspinal  Sensation intact LT Bilateral L3-4-5 S1 dermatomal distribution   Assessment/Plan: 1. Functional deficits which require 3+ hours per day of interdisciplinary therapy in a comprehensive inpatient rehab setting. Physiatrist is providing close team  supervision and 24 hour management of active medical problems listed below. Physiatrist and rehab team continue to assess barriers to discharge/monitor patient progress toward functional and medical goals  Care Tool:  Bathing    Body parts bathed by patient: Front perineal area, Abdomen, Chest, Left arm, Right arm, Face, Left upper leg, Right upper leg   Body parts bathed by helper: Buttocks     Bathing assist Assist Level: Moderate Assistance - Patient 50 - 74%     Upper Body Dressing/Undressing Upper body dressing   What is the patient wearing?: Pull over shirt    Upper body assist Assist Level: Moderate Assistance - Patient 50 - 74%    Lower Body Dressing/Undressing Lower body dressing      What is the patient wearing?: Incontinence brief, Pants     Lower body assist Assist for lower body dressing: Maximal Assistance - Patient 25 - 49%     Toileting Toileting Toileting Activity did not occur (Clothing management and hygiene only): N/A (no void or bm)  Toileting assist Assist for toileting: Contact Guard/Touching assist     Transfers Chair/bed transfer  Transfers assist     Chair/bed transfer assist level: Maximal Assistance - Patient 25 - 49% Chair/bed transfer assistive device: Programmer, multimedia   Ambulation assist      Assist level: Contact  Guard/Touching assist Assistive device: Cane-quad Max distance: 265f   Walk 10 feet activity   Assist     Assist level: Contact Guard/Touching assist Assistive device: Cane-quad   Walk 50 feet activity   Assist    Assist level: Contact Guard/Touching assist Assistive device: Cane-quad    Walk 150 feet activity   Assist Walk 150 feet activity did not occur: Safety/medical concerns         Walk 10 feet on uneven surface  activity   Assist     Assist level: Contact Guard/Touching assist Assistive device: Cane-quad   Wheelchair     Assist Is the patient using a  wheelchair?: No (only for time management due to slow gait speed)             Wheelchair 50 feet with 2 turns activity    Assist            Wheelchair 150 feet activity     Assist          Blood pressure 101/64, pulse 98, temperature (!) 97.5 F (36.4 C), resp. rate 17, height '5\' 7"'$  (1.702 m), weight 55.4 kg, SpO2 96 %.  Medical Problem List and Plan: 1. 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  Antithrombotics: -DVT/anticoagulation: Coumadin resumed 04/29/2022 with bridging of Lovenox.  INR goal 2.5-3.5, INR still low , cont lovenox 62.'5mg'$  q12h INR 2.7             -antiplatelet therapy: N/A  3. Pain: continue Neurontin 100 mg nightly, RObaxin  Lumbar xray no new changes vs MRI 5/23 7/7 increase gabapentin to '200mg'$  QID 4. Mood/Sleep: Provide emotional support             -antipsychotic agents: N/A 5. Neuropsych/cognition: This patient is capable of making decisions on her own behalf. 6. Skin/Wound Care: Routine skin checks 7. Fluids/Electrolytes/Nutrition: Routine in and outs with follow-up chemistries 8.  History of mitral valve replacement 1997.  Resuming Coumadin with bridging of Lovenox 04/29/2022.  Follow-up per cardiology services as needed -7/6 INR above goal, coumadin held today, monitor for bleeding 7/7 pharmacy adjusting coumadin dose 9.  Hyperlipidemia.  Continue Lipitor 10.  Hypertension.  Continue Norvasc 5 mg daily Vitals:   05/14/22 1519 05/14/22 1947  BP: 118/68 101/64  Pulse: 100 98  Resp: 15 17  Temp: 97.8 F (36.6 C) (!) 97.5 F (36.4 C)  SpO2: 97% 96%   7/7 well controlled, follow 11.  Prediabetes.  Hemoglobin A1c 6.1.  Diet  controlled/SSI discontinued 12.  History of lumbar spinal stenosis  with increased radicular pain , would not rec reschedule  ESI due to anticoag issues -would not push distances for ambulation  13.  GERD.  Protonix 14. Insomnia She plans to find out what medication she takes at home for consideration of use at the hospital 15. Hypokalemia  -KCL 232m x2 , recheck     Latest Ref Rng & Units 05/04/2022    6:10 AM 05/03/2022    6:16 AM 04/30/2022   12:26 AM  BMP  Glucose 70 - 99 mg/dL 122  118  135   BUN 8 - 23 mg/dL '20  19  20   '$ Creatinine 0.44 - 1.00 mg/dL 0.87  0.73  0.75   Sodium 135 - 145 mmol/L 142  138  141   Potassium 3.5 - 5.1 mmol/L 4.1  3.4  3.7   Chloride 98 - 111 mmol/L 106  105  109   CO2 22 - 32 mmol/L '29  28  26   '$ Calcium 8.9 - 10.3 mg/dL 9.7  9.1  9.2    7/2 K+ at 4.1 (6/27), trend with weekly labs 16. Anemia mild  -Continue to monitor    Latest Ref Rng & Units 05/13/2022    5:23 AM 05/03/2022    6:16 AM 04/30/2022   12:26 AM  CBC  WBC 4.0 - 10.5 K/uL 6.4  4.4  6.0   Hemoglobin 12.0 - 15.0 g/dL 11.0  11.4  11.9   Hematocrit 36.0 - 46.0 % 34.9  35.8  37.8   Platelets 150 - 400 K/uL 244  149  171    continue to follow with weekly labs  LOS: 15 days A FACE TO FACE EVALUATION WAS PERFORMED  Charlett Blake 05/15/2022, 11:07 AM

## 2022-05-16 MED ORDER — TAPENTADOL HCL 50 MG PO TABS
50.0000 mg | ORAL_TABLET | Freq: Four times a day (QID) | ORAL | Status: DC | PRN
  Administered 2022-05-17 – 2022-05-19 (×4): 50 mg via ORAL
  Filled 2022-05-16 (×5): qty 1

## 2022-05-16 NOTE — Progress Notes (Addendum)
PROGRESS NOTE   Subjective/Complaints:  Per PT had some difficulty with transfers yesterday , balance off, pt notes mental fogginess Has been on Hydrocodone more regularly with increased dose gabapentin   ROS-neg CP, SOB, N/V/D, cough, chills, or HA  Objective:   No results found. No results for input(s): "WBC", "HGB", "HCT", "PLT" in the last 72 hours.   No results for input(s): "NA", "K", "CL", "CO2", "GLUCOSE", "BUN", "CREATININE", "CALCIUM" in the last 72 hours.   Intake/Output Summary (Last 24 hours) at 05/16/2022 0929 Last data filed at 05/16/2022 0921 Gross per 24 hour  Intake 473 ml  Output 575 ml  Net -102 ml         Physical Exam: Vital Signs Blood pressure 118/76, pulse 98, temperature 98.2 F (36.8 C), temperature source Oral, resp. rate 16, height '5\' 7"'$  (1.702 m), weight 55.4 kg, SpO2 96 %.    General: No acute distress. Appears uncomfortable,she says she is waiting on pain medications this AM. Mood and affect are appropriate Heart: Regular rate and rhythm  Lungs: Clear to auscultation, breathing unlabored, no rales or wheezes Abdomen: Positive bowel sounds, soft nontender to palpation, nondistended Extremities: No clubbing, cyanosis, or edema Skin: No evidence of breakdown, no evidence of rash   Neuro: CN II-XII intact, motor strength 4/5 bilateral deltoid, bicep, triceps, grip, hip flexor, knee extensors, ankle dorsiflexors/plantar flexors.  Sensory exam normal sensation to light touch lower extremities.  Speech with minimal dysarthria, sentence level, occasional word finding, but appears at baseline. Musculoskeletal: , neg SLR BLE No tenderness or joint swelling L hip or thigh, pain to palp Left lumbar paraspinal  Sensation intact LT Bilateral L3-4-5 S1 dermatomal distribution   Assessment/Plan: 1. Functional deficits which require 3+ hours per day of interdisciplinary therapy in a comprehensive  inpatient rehab setting. Physiatrist is providing close team supervision and 24 hour management of active medical problems listed below. Physiatrist and rehab team continue to assess barriers to discharge/monitor patient progress toward functional and medical goals  Care Tool:  Bathing    Body parts bathed by patient: Front perineal area, Abdomen, Chest, Left arm, Right arm, Face, Left upper leg, Right upper leg   Body parts bathed by helper: Buttocks     Bathing assist Assist Level: Moderate Assistance - Patient 50 - 74%     Upper Body Dressing/Undressing Upper body dressing   What is the patient wearing?: Pull over shirt    Upper body assist Assist Level: Moderate Assistance - Patient 50 - 74%    Lower Body Dressing/Undressing Lower body dressing      What is the patient wearing?: Incontinence brief, Pants     Lower body assist Assist for lower body dressing: Maximal Assistance - Patient 25 - 49%     Toileting Toileting Toileting Activity did not occur (Clothing management and hygiene only): N/A (no void or bm)  Toileting assist Assist for toileting: Contact Guard/Touching assist     Transfers Chair/bed transfer  Transfers assist     Chair/bed transfer assist level: Maximal Assistance - Patient 25 - 49% Chair/bed transfer assistive device: Emergency planning/management officer  level: Contact Guard/Touching assist Assistive device: Cane-quad Max distance: 273f   Walk 10 feet activity   Assist     Assist level: Contact Guard/Touching assist Assistive device: Cane-quad   Walk 50 feet activity   Assist    Assist level: Contact Guard/Touching assist Assistive device: Cane-quad    Walk 150 feet activity   Assist Walk 150 feet activity did not occur: Safety/medical concerns         Walk 10 feet on uneven surface  activity   Assist     Assist level: Contact Guard/Touching assist Assistive device:  Cane-quad   Wheelchair     Assist Is the patient using a wheelchair?: No (only for time management due to slow gait speed)             Wheelchair 50 feet with 2 turns activity    Assist            Wheelchair 150 feet activity     Assist          Blood pressure 118/76, pulse 98, temperature 98.2 F (36.8 C), temperature source Oral, resp. rate 16, height '5\' 7"'$  (1.702 m), weight 55.4 kg, SpO2 96 %.  Medical Problem List and Plan: 1. 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  Antithrombotics: -DVT/anticoagulation: Coumadin resumed 04/29/2022 with bridging of Lovenox.  INR goal 2.5-3.5, INR still low , INR 2.5   3. Pain: continue Neurontin 100 mg nightly, RObaxin  Lumbar xray no new changes vs MRI 5/23 7/7 increase gabapentin to '200mg'$  QID, may be contributing to MS changes will d/c and use Nucynta which should help neuropathic component  4. Mood/Sleep: Provide emotional support             -antipsychotic agents: N/A 5. Neuropsych/cognition: This patient is capable of making decisions on her own behalf. 6. Skin/Wound Care: Routine skin checks 7. Fluids/Electrolytes/Nutrition: Routine in and outs with follow-up chemistries 8.  History of mitral valve replacement 1997.  Resuming Coumadin with bridging of Lovenox 04/29/2022.  Follow-up per cardiology services as needed -7/6 INR above goal, coumadin held today, monitor for bleeding 7/7 pharmacy adjusting coumadin dose 9.  Hyperlipidemia.  Continue Lipitor 10.  Hypertension.  Continue Norvasc 5 mg daily Vitals:   05/15/22 1956 05/16/22 0446  BP: 119/69 118/76  Pulse: 94 98  Resp: 14 16  Temp: 98.5 F (36.9 C)  98.2 F (36.8 C)  SpO2: 99% 96%   7/9well controlled, follow 11.  Prediabetes.  Hemoglobin A1c 6.1.  Diet controlled/SSI discontinued 12.  History of lumbar spinal stenosis  with increased radicular pain , would not rec reschedule  ESI due to anticoag issues -would not push distances for ambulation  13.  GERD.  Protonix 14. Insomnia She plans to find out what medication she takes at home for consideration of use at the hospital 15. Hypokalemia  -KCL 245m x2 , recheck     Latest Ref Rng & Units 05/04/2022    6:10 AM 05/03/2022    6:16 AM 04/30/2022   12:26 AM  BMP  Glucose 70 - 99 mg/dL 122  118  135   BUN 8 - 23 mg/dL '20  19  20   '$ Creatinine 0.44 - 1.00 mg/dL 0.87  0.73  0.75   Sodium 135 - 145 mmol/L 142  138  141   Potassium 3.5 - 5.1 mmol/L 4.1  3.4  3.7   Chloride 98 - 111 mmol/L 106  105  109   CO2 22 - 32 mmol/L '29  28  26   '$ Calcium 8.9 - 10.3 mg/dL 9.7  9.1  9.2    7/2 K+ at 4.1 (6/27), trend with weekly labs 16. Anemia mild  -Continue to monitor    Latest Ref Rng & Units 05/13/2022    5:23 AM 05/03/2022    6:16 AM 04/30/2022   12:26 AM  CBC  WBC 4.0 - 10.5 K/uL 6.4  4.4  6.0   Hemoglobin 12.0 - 15.0 g/dL 11.0  11.4  11.9   Hematocrit 36.0 - 46.0 % 34.9  35.8  37.8   Platelets 150 - 400 K/uL 244  149  171    continue to follow with weekly labs   Addendum Trigger Point Injection  Indication: Left Lumbar paraspinal  Myofascial pain not relieved by medication management and other conservative care.  Informed consent was obtained after describing risk and benefits of the procedure with the patient, this includes bleeding, bruising, infection and medication side effects.  The patient wishes to proceed and has given written consent.  The patient was placed in a Right lateral decubitus position.  The Left L3,L3, L5 area was marked and prepped with Betadine.  It was entered with a 27-gauge 1-1/2 inch needle and 1 mL of 1% lidocaine was injected into each of 3 trigger points,  after negative draw back for blood. Twitch sign at L4 The patient tolerated the procedure well.  Post procedure instructions were given.  LOS: 16 days A FACE TO FACE EVALUATION WAS PERFORMED  Charlett Blake 05/16/2022, 9:29 AM

## 2022-05-16 NOTE — Progress Notes (Signed)
Occupational Therapy Session Note  Patient Details  Name: [name] MRN: [id number] Date of Birth: 09/29/1946  Today's Date: 05/16/2022 OT Individual Time: 1202-1243 OT Individual Time Calculation (min): 41 min    Short Term Goals: Week 1:  OT Short Term Goal 1 (Week 1): Pt to be CGA for functional transfers with LRD OT Short Term Goal 1 - Progress (Week 1): Not met OT Short Term Goal 2 (Week 1): Pt to be Min A for LB dressing/bathing with LRD OT Short Term Goal 2 - Progress (Week 1): Not met OT Short Term Goal 3 (Week 1): Pt to be Min A for toileting tasks with LRD OT Short Term Goal 3 - Progress (Week 1): Not met OT Short Term Goal 4 (Week 1): Pt to demonstrate increased coordination to the R UE during functional tasks OT Short Term Goal 4 - Progress (Week 1): Progressing toward goal Week 2:  OT Short Term Goal 1 (Week 2): Patient will transition from sit to stand with mod assist to prepare for toilet transfer OT Short Term Goal 2 (Week 2): Patient will don pants over feet with no more than min assist OT Short Term Goal 3 (Week 2): Patient will pull pants over hips with no more than mod assists OT Short Term Goal 4 (Week 2): Patient will maintain standing with BUE in support for 10 seconds with mod assist in preparation for LB ADL  Skilled Therapeutic Interventions/Progress Updates:  Patient met lying supine in bed in agreement with OT treatment session. Husband present at bedside throughout treatment session. 10/10 pain reported in low back at rest and with activity. MD present for part of session for trigger point injection. Patient requires Min-Mod A for rolling R<>L in supine, Mod-Max A for supine <> EOB transfers and and Mod A progressing to Min A to maintain static sitting at EOB. Noted R lateral lean. Improves with cues for orientation to midline. Max A to don LB clothing seated EOB and Max A for sit to stand from EOB via face to face transfer to hike pants over hips in standing.  Total A to don footwear and AFO. Max-Total A for squat-pivot to wc on L. Session concluded with patient seated in wc with call bell within reach, belt alarm activated and all needs met. Lunch tray present. Provided set-up A for self-feeding.   Therapy Documentation Precautions:  Precautions Precautions: Fall, Other (comment) Precaution Comments: pt has personal L LE AFO Restrictions Weight Bearing Restrictions: No General:    Therapy/Group: Individual Therapy   R Howerton-Davis 05/16/2022, 7:31 AM 

## 2022-05-16 NOTE — Consult Note (Signed)
Neuropsychological Consultation   Patient:   [name]   DOB:   [birth year]  MR Number:  [id number]  Location:  [facility] [address] C[address] [place] [id number] [place] [address] Dept: [place]: [phone]           Date of Service:   05/14/2022  Start Time:   8 AM End Time:   9 AM  Provider/Observer:  Ilean Skill, Psy.D.       Clinical Neuropsychologist       Billing Code/Service: [phone]  Chief Complaint:    [name] is a 76 year old female who had previously been seen in the CIR in 2020 post-CVA with residual left hemiparesis and was seen a second time in 2021 due to subcortical CVA.  Patient presented on 04/26/2022 with acute onset of slurred speech as well as altered mental status.  Cranial CT scan showed acute subarachnoid hemorrhage underlying the left greater right perioccipital lobes as well as redemonstrated focus of chronic encephalomalacia within the right frontal parietal lobes.  Chronic infarcts within the left thalamus and bilateral cerebellar hemispheres were also noted.  Conservative care was elected due to subarachnoid hemorrhage in the left parietal cortex.  Patient was eventually referred to CIR due to decreased functional mobility and slurred speech.  Reason for Service:  Patient was referred for neuropsychological consultation due to coping and adjustment with extended hospital stay and issues with depression and frustration with worsening of her already challenging neurological status.  Below is the HPI for the current admission.  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 Alysia Penna in the outpatient office.  Per chart review patient lives with spouse.  Two-level home bed and bath main level one-step to entry.  Uses a quad cane in the home, generally modified independent with mobility.  Presented 04/26/2022 with acute onset of slurred speech as well as altered mental status.  Family denied any recent trauma or fall.  Patient does have a history of chronic hip pain was scheduled to have injection 04/29/2022 thus her Coumadin had been held since 04/23/2022 and she had been placed on Lovenox bridge injections 1 mg/kilogram twice daily administered by her husband..  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  Chronic infarcts within the left thalamus and bilateral cerebellar hemispheres.  CT angiogram of head and neck no hemodynamically significant stenosis occlusion dissection or aneurysm.  Admission chemistries unremarkable aside glucose 155 BUN 26, hemoglobin A1c 6.1, ammonia level within normal limits, urinalysis negative nitrite, urine drug screen negative.  Hospital course discussed with neurosurgery/Dr. Annette Stable with conservative care and repeat cranial CT scan 04/27/2022 again showing moderate amount of subarachnoid hemorrhage in left parietal cortex with slight interval increase.  Small amount of subarachnoid hemorrhage in the right parietal cortex not changed.  Plan on resuming Coumadin with Lovenox bridge 04/29/2022.  Patient tolerating a regular diet.  Therapy evaluations completed due to patient decreased functional mobility and slurred speech was admitted for a comprehensive rehab program.  Current Status:  Patient  was awake and alert noting improvement in her overall status but continuing with significant chronic  symptoms and worsening of new symptoms.  Patient with some degree of visual neglect tending to pay attention to the right side of her visual field.  Family and staff of been working on trying to get her attention more to her left visual field.  Patient acknowledges frustration with further loss of functioning and is aware of her overall status.  Behavioral Observation: [name]  presents as a 76 y.o.-year-old Right handed Caucasian Female who appeared her stated age. her dress was Appropriate and she was Well Groomed and her manners were Appropriate to the situation.  her participation was indicative of Appropriate and Redirectable behaviors.  There were physical disabilities noted.  she displayed an appropriate level of cooperation and motivation.     Interactions:    Active Redirectable  Attention:   abnormal and attention span appeared shorter than expected for age  Memory:   abnormal; remote memory intact, recent memory impaired  Visuo-spatial:  abnormal visual neglect  Speech (Volume):  low  Speech:   normal; slurred  Thought Process:  Coherent and Relevant  Though Content:  WNL; not suicidal and not homicidal  Orientation:   person, place, and situation  Judgment:   Fair  Planning:   Poor  Affect:    Depressed  Mood:    Dysphoric  Insight:   Fair  Intelligence:   normal   Medical History:   Past Medical History:  Diagnosis Date   Abnormality of gait 09/07/2016   Allergy    Diabetes mellitus without complication ([place])    Patient denies this - notes history of glucose intolerance   GERD (gastroesophageal reflux disease)    Hemiparesis and alteration of sensations as late effects of stroke ([place]) 09/07/2016   History of pneumonia 1997   Hypertension    S/P MVR (mitral valve replacement)    Mechanical mitral valve replacement at age 71 (done in [place])  // echo 7/17: EF 55-60, normal wall motion, bileaflet mechanical mitral valve prosthesis functioning  normally, mild LAE, mildly reduced RVSF, small pericardial effusion   Stroke ([place]) 1997, 2013, 2015         Patient Active Problem List   Diagnosis Date Noted   Subarachnoid hemorrhage ([place]) 04/30/2022   SAH (subarachnoid hemorrhage) ([place]) 04/26/2022   Goals of care, counseling/discussion    Palliative care by specialist    Acute CVA (cerebrovascular accident) ([place] [facility]) 07/01/2020   Subcortical infarction ([place] [place]) 09/05/2019   Anticoagulated on warfarin    Left hemiparesis ([place])    History of CVA with residual deficit    Thrombocytopenia ([facility])    Prediabetes    Vertigo 09/24/2017   Benign paroxysmal positional vertigo    History of stroke    HLD (hyperlipidemia) 08/23/2017   Encounter for therapeutic drug monitoring 08/08/2017   Long term (current) use of anticoagulants [Z79.01] 07/12/2017   Hemiparesis and alteration of sensations as late effects of stroke ([place]) 09/07/2016   Abnormality of gait 09/07/2016   HTN (hypertension) 04/13/2016   H/O mitral valve replacement with mechanical valve 10/16/2015   Diabetes mellitus without complication ([facility])    Hemiparesis (L sided - mild) due to old stroke [facility])         Psychiatric History:  On no prior psychiatric history is noted the patient is having coping issues with depressive symptomatology with new presentation of cerebrovascular accident.  Family Med/Psych History:  Family History  Problem Relation  Age of Onset   Cancer Mother        Bone   Heart disease Mother    Hyperlipidemia Mother    Hypertension Mother    Stroke Father    Hypertension Father    Heart attack Neg Hx     Impression/DX:  [name] is a 76 year old female who had previously been seen in the CIR in 2020 post-CVA with residual left hemiparesis and was seen a second time in 2021 due to subcortical CVA.  Patient presented on 04/26/2022 with acute onset of slurred speech as well as altered mental status.  Cranial CT scan showed acute subarachnoid hemorrhage  underlying the left greater right perioccipital lobes as well as redemonstrated focus of chronic encephalomalacia within the right frontal parietal lobes.  Chronic infarcts within the left thalamus and bilateral cerebellar hemispheres were also noted.  Conservative care was elected due to subarachnoid hemorrhage in the left parietal cortex.  Patient was eventually referred to CIR due to decreased functional mobility and slurred speech.  Disposition/Plan:  Today we worked on coping and adjustment issues regarding significant ongoing neurological symptoms with the patient now in [place] with her third significant CVA with this most recent related to a subarachnoid hemorrhage over the right parietal cortex accounting for her left visual field neglect and other cognitive deficits.  Diagnosis:    SAH (subarachnoid hemorrhage) ([place]) - Plan: Ambulatory referral to Neurology, Ambulatory referral to Physical Medicine Rehab         Electronically Signed   _______________________ Ilean Skill, Psy.D. Clinical Neuropsychologist

## 2022-05-17 LAB — PROTIME-INR
INR: 2.7 — ABNORMAL HIGH (ref 0.8–1.2)
Prothrombin Time: 28.1 seconds — ABNORMAL HIGH (ref 11.4–15.2)

## 2022-05-17 MED ORDER — WARFARIN SODIUM 2.5 MG PO TABS
2.5000 mg | ORAL_TABLET | ORAL | Status: DC
  Administered 2022-05-19 – 2022-05-23 (×2): 2.5 mg via ORAL
  Filled 2022-05-17 (×2): qty 1

## 2022-05-17 MED ORDER — WARFARIN SODIUM 5 MG PO TABS
5.0000 mg | ORAL_TABLET | ORAL | Status: DC
  Administered 2022-05-17 – 2022-05-22 (×5): 5 mg via ORAL
  Filled 2022-05-17 (×5): qty 1

## 2022-05-17 NOTE — Progress Notes (Signed)
ANTICOAGULATION CONSULT NOTE  Pharmacy Consult for Warfarin Indication:  mechanical mitral valve  Allergies  Allergen Reactions   Zoloft [Sertraline] Hives, Itching and Rash    Patient Measurements: Height: '5\' 7"'$  (170.2 cm) Weight: 55.4 kg (122 lb 2.2 oz) IBW/kg (Calculated) : 61.6   Vital Signs: Temp: 97.8 F (36.6 C) (07/10 0434) Temp Source: Oral (07/10 0434) BP: 116/70 (07/10 0434) Pulse Rate: 89 (07/10 0434)  Labs: Recent Labs    05/15/22 0721 05/17/22 0524  LABPROT 27.1* 28.1*  INR 2.5* 2.7*    Estimated Creatinine Clearance: 48.9 mL/min (by C-G formula based on SCr of 0.87 mg/dL).   Assessment: 76 YO female with medical history significant for mMVR in 1997 on warfarin PTA.  Patient presented to the ED with confusion and slurred speech found to have acute SAH. Lovenox / warfarin from PTA was then held on admission and resumed 6/22. Lovenox bridge completed. Pharmacy consulted to manage warfarin during rehab admission.    INR is at goal/therapeutic at 2.7 today. No s/sx bleed or adverse coagulation events. CBC WNL.   Goal of Therapy:  INR 2.5-3.5 Monitor platelets by anticoagulation protocol: Yes   Plan:  Warfarin 5 mg PO daily except 2.5 mg PO Sun/Thu.  Check INR M/Th while on warfarin Continue to monitor H&H and platelets    Thank you for involving pharmacy in this patient's care.  Gena Fray, PharmD PGY1 Pharmacy Resident   05/17/2022 11:48 AM

## 2022-05-17 NOTE — Progress Notes (Signed)
Occupational Therapy Session Note  Patient Details  Name: [name] MRN: [id number] Date of Birth: [birth year]  Today's Date: 05/17/2022 OT Individual Time: 1740-8144 OT Individual Time Calculation (min): 74 min    Short Term Goals: Week 2:  OT Short Term Goal 1 (Week 2): Patient will transition from sit to stand with mod assist to prepare for toilet transfer OT Short Term Goal 2 (Week 2): Patient will don pants over feet with no more than min assist OT Short Term Goal 3 (Week 2): Patient will pull pants over hips with no more than mod assists OT Short Term Goal 4 (Week 2): Patient will maintain standing with BUE in support for 10 seconds with mod assist in preparation for LB ADL  Skilled Therapeutic Interventions/Progress Updates:  Pt greeted supine in bed, pt agreeable to OT intervention. Session focus on BADL reeducation, functional mobility, dynamic standing balance and decreasing overall caregiver burden.        Pt reports needing to void b/b, MAX A for sit>supine with an emphasis on segmental movement and slow progression for pain mgmt. Pt with more of a R lateral lean in sitting needing up to MOD A for static sitting balance progressing to CGA.  Pt completed stand pivot to [facility] to L side with MOD face to face pivot transfer. Pt with +b/b movement. Pt needed MOD A +2 for 3/3 toileting tasks needing one person to assist with standing person while second person completed hygiene and clothing mgmt. Pt pivoted back to bed with MODA.  Pt completed dressing EOM with MOD A for UB dressing d/t impaired balance and pt needing + time d/t pain. MAX A to don pants needing assist to thread pants but pt was able to assist with pulling pants towards waist line in sitting, overall MAX A to pull up in standing.  Did trial high back w/c for + support however pt unable to tolerate chair. Pt did complete stand pivot to recliner with MOD A. pt left seated in w/c with all needs within reach.                       Therapy Documentation Precautions:  Precautions Precautions: Fall, Other (comment) Precaution Comments: pt has personal L LE AFO Restrictions Weight Bearing Restrictions: No  Pain:5/10 pain in back, rest breaks, distraction, and deep breathing utilized as pain mgmt strategies.  Therapy/Group: Individual Therapy  Precious Haws 05/17/2022, 12:19 PM

## 2022-05-17 NOTE — Progress Notes (Signed)
Physical Therapy Session Note  Patient Details  Name: [name] MRN: [id number] Date of Birth: 05/06/1946  Today's Date: 05/17/2022 PT Individual Time: 1415-1525 PT Individual Time Calculation (min): 70 min   Short Term Goals: Week 1:  PT Short Term Goal 1 (Week 1): Pt will perform supine<>sit with supervision PT Short Term Goal 1 - Progress (Week 1): Not met PT Short Term Goal 2 (Week 1): Pt will perform sit<>stands using LRAD with CGA PT Short Term Goal 2 - Progress (Week 1): Not met PT Short Term Goal 3 (Week 1): Pt will perform bed<>chair transfers using LRAD with CGA PT Short Term Goal 3 - Progress (Week 1): Not met PT Short Term Goal 4 (Week 1): Pt will ambulate at least 149f using LRAD with CGA PT Short Term Goal 4 - Progress (Week 1): Not met PT Short Term Goal 5 (Week 1): Pt will participate in standardized outcome measure to assess fall risk PT Short Term Goal 5 - Progress (Week 1): Not met  Skilled Therapeutic Interventions/Progress Updates:    Pt received in recliner and agreeable to therapy.  Pt reports unrated pain in back throughout session with certain movements, premedicated. Rest and positioning provided as needed. With encouragement, pt performed mod A Stand pivot transfer using therapist's shoulders for support, max A. Donned personal compression socks and shoes with L AFO tot A for time. Pt transported to therapy gym for time management and energy conservation. Pt directed in Sit to stand in // bars x 5 with min A facilitation for anterior weight shift. Pt with slight posterior lean but able to maintain balance with UE support. Pt stood long enough to transfer hands from bars<>therapists shoulders to encourage decr UE reliance and improve anterior weight shift. Rest breaks required between attempts d/t fatigue. Pt then transported to outdoor environment for therapeutic benefit on mood and outlook. Pt returned to recliner after session with mod A Stand pivot transfer  with support from therapist's shoulders. Pt remained in chair and was left with all needs in reach and alarm active.   Therapy Documentation Precautions:  Precautions Precautions: Fall, Other (comment) Precaution Comments: pt has personal L LE AFO Restrictions Weight Bearing Restrictions: No General:       Therapy/Group: Individual Therapy  OMickel Fuchs7/08/2022, 3:36 PM

## 2022-05-17 NOTE — Progress Notes (Signed)
PROGRESS NOTE   Subjective/Complaints:  Pt reports wasn't wet this AM- was dry when first checked, which is new and good.  Wants to have BM today, but doesn't want anything from physician/ meds wise because doesn't want to have any accidents.     ROS: Pt denies SOB, abd pain, CP, N/V/C/D, and vision changes  Objective:   No results found. No results for input(s): "WBC", "HGB", "HCT", "PLT" in the last 72 hours.   No results for input(s): "NA", "K", "CL", "CO2", "GLUCOSE", "BUN", "CREATININE", "CALCIUM" in the last 72 hours.   Intake/Output Summary (Last 24 hours) at 05/17/2022 1044 Last data filed at 05/17/2022 0842 Gross per 24 hour  Intake 240 ml  Output 175 ml  Net 65 ml        Physical Exam: Vital Signs Blood pressure 116/70, pulse 89, temperature 97.8 F (36.6 C), temperature source Oral, resp. rate 18, height '5\' 7"'$  (1.702 m), weight 55.4 kg, SpO2 97 %.     General: awake, alert, appropriate, laying supine in bed; NAD HENT: conjugate gaze; oropharynx moist CV: regular rate; no JVD Pulmonary: CTA B/L; no W/R/R- good air movement GI: soft, NT, ND, (+)BS Psychiatric: appropriate Neurological: alert Extremities: No clubbing, cyanosis, or edema Skin: No evidence of breakdown, no evidence of rash   Neuro: CN II-XII intact, motor strength 4/5 bilateral deltoid, bicep, triceps, grip, hip flexor, knee extensors, ankle dorsiflexors/plantar flexors.  Sensory exam normal sensation to light touch lower extremities.  Speech with minimal dysarthria, sentence level, occasional word finding, but appears at baseline. Musculoskeletal: , neg SLR BLE No tenderness or joint swelling L hip or thigh, pain to palp Left lumbar paraspinal  Sensation intact LT Bilateral L3-4-5 S1 dermatomal distribution   Assessment/Plan: 1. Functional deficits which require 3+ hours per day of interdisciplinary therapy in a comprehensive  inpatient rehab setting. Physiatrist is providing close team supervision and 24 hour management of active medical problems listed below. Physiatrist and rehab team continue to assess barriers to discharge/monitor patient progress toward functional and medical goals  Care Tool:  Bathing    Body parts bathed by patient: Front perineal area, Abdomen, Chest, Left arm, Right arm, Face, Left upper leg, Right upper leg   Body parts bathed by helper: Buttocks     Bathing assist Assist Level: Moderate Assistance - Patient 50 - 74%     Upper Body Dressing/Undressing Upper body dressing   What is the patient wearing?: Pull over shirt    Upper body assist Assist Level: Moderate Assistance - Patient 50 - 74%    Lower Body Dressing/Undressing Lower body dressing      What is the patient wearing?: Incontinence brief, Pants     Lower body assist Assist for lower body dressing: Maximal Assistance - Patient 25 - 49%     Toileting Toileting Toileting Activity did not occur (Clothing management and hygiene only): N/A (no void or bm)  Toileting assist Assist for toileting: Contact Guard/Touching assist     Transfers Chair/bed transfer  Transfers assist     Chair/bed transfer assist level: Maximal Assistance - Patient 25 - 49% Chair/bed transfer assistive device: Programmer, multimedia  Ambulation assist      Assist level: Contact Guard/Touching assist Assistive device: Cane-quad Max distance: 252f   Walk 10 feet activity   Assist     Assist level: Contact Guard/Touching assist Assistive device: Cane-quad   Walk 50 feet activity   Assist    Assist level: Contact Guard/Touching assist Assistive device: Cane-quad    Walk 150 feet activity   Assist Walk 150 feet activity did not occur: Safety/medical concerns         Walk 10 feet on uneven surface  activity   Assist     Assist level: Contact Guard/Touching assist Assistive device:  Cane-quad   Wheelchair     Assist Is the patient using a wheelchair?: No (only for time management due to slow gait speed)             Wheelchair 50 feet with 2 turns activity    Assist            Wheelchair 150 feet activity     Assist          Blood pressure 116/70, pulse 89, temperature 97.8 F (36.6 C), temperature source Oral, resp. rate 18, height '5\' 7"'$  (1.702 m), weight 55.4 kg, SpO2 97 %.  Medical Problem List and Plan: 1. [facility] as well as history of CVA x2 with residual left-sided weakness/brain surgery with craniotomy             -patient may shower             -ELOS/Goals: 7/14            Con't CIR- PT, OT and SLP  -continue PRN pain medications prior to therapy to stay ahead of pain 2.  Antithrombotics: -DVT/anticoagulation: Coumadin resumed 04/29/2022 with bridging of Lovenox.  INR goal 2.5-3.5, INR still low , INR 2.5   3. Pain: continue Neurontin 100 mg nightly, RObaxin  Lumbar xray no new changes vs MRI 5/23 7/7 increase gabapentin to '200mg'$  QID, may be contributing to MS changes will d/c and use Nucynta which should help neuropathic component   7/10- con't regimen- pain tolerable 4. Mood/Sleep: Provide emotional support             -antipsychotic agents: N/A 5. Neuropsych/cognition: This patient is capable of making decisions on her own behalf. 6. Skin/Wound Care: Routine skin checks 7. Fluids/Electrolytes/Nutrition: Routine in and outs with follow-up chemistries 8.  History of mitral valve replacement 1997.  Resuming Coumadin with bridging of Lovenox 04/29/2022.  Follow-up per cardiology services as needed -7/6 INR above goal, coumadin held today, monitor for bleeding 7/7 pharmacy adjusting coumadin dose 9.  Hyperlipidemia.  Continue Lipitor 10.  Hypertension.  Continue Norvasc 5 mg daily Vitals:   05/16/22 2055 05/17/22 0434  BP: 109/73 116/70  Pulse: (!) 107 89  Resp:  18   Temp: 100.2 F (37.9 C) 97.8 F (36.6 C)  SpO2: 95% 97%   7/10- BP controlled- HR a little elevated this Am- con't to monitor for trend 11.  Prediabetes.  Hemoglobin A1c 6.1.  Diet controlled/SSI discontinued 12.  History of lumbar spinal stenosis  with increased radicular pain , would not rec reschedule  ESI due to anticoag issues -would not push distances for ambulation  13.  GERD.  Protonix 14. Insomnia She plans to find out what medication she takes at home for consideration of use at the hospital 15. Hypokalemia  -KCL 22m x2 , recheck  Latest Ref Rng & Units 05/04/2022    6:10 AM 05/03/2022    6:16 AM 04/30/2022   12:26 AM  BMP  Glucose 70 - 99 mg/dL 122  118  135   BUN 8 - 23 mg/dL '20  19  20   '$ Creatinine 0.44 - 1.00 mg/dL 0.87  0.73  0.75   Sodium 135 - 145 mmol/L 142  138  141   Potassium 3.5 - 5.1 mmol/L 4.1  3.4  3.7   Chloride 98 - 111 mmol/L 106  105  109   CO2 22 - 32 mmol/L '29  28  26   '$ Calcium 8.9 - 10.3 mg/dL 9.7  9.1  9.2    7/2 K+ at 4.1 (6/27), trend with weekly labs 16. Anemia mild  -Continue to monitor    Latest Ref Rng & Units 05/13/2022    5:23 AM 05/03/2022    6:16 AM 04/30/2022   12:26 AM  CBC  WBC 4.0 - 10.5 K/uL 6.4  4.4  6.0   Hemoglobin 12.0 - 15.0 g/dL 11.0  11.4  11.9   Hematocrit 36.0 - 46.0 % 34.9  35.8  37.8   Platelets 150 - 400 K/uL 244  149  171    continue to follow with weekly labs   Addendum Trigger Point Injection  Indication: Left Lumbar paraspinal  Myofascial pain not relieved by medication management and other conservative care.  Informed consent was obtained after describing risk and benefits of the procedure with the patient, this includes bleeding, bruising, infection and medication side effects.  The patient wishes to proceed and has given written consent.  The patient was placed in a Right lateral decubitus position.  The Left L3,L3, L5 area was marked and prepped with Betadine.  It was entered with a 27-gauge 1-1/2 inch  needle and 1 mL of 1% lidocaine was injected into each of 3 trigger points, after negative draw back for blood. Twitch sign at L4 The patient tolerated the procedure well.  Post procedure instructions were given.  LOS: 17 days A FACE TO FACE EVALUATION WAS PERFORMED  Fanny Agan 05/17/2022, 10:44 AM

## 2022-05-17 NOTE — Progress Notes (Signed)
Occupational Therapy Weekly Progress Note  Patient Details  Name: [name] MRN: [id number] Date of Birth: 07-16-1946  Beginning of progress report period: May 10, 2022 End of progress report period: May 17, 2022  Today's Date: 05/17/2022  Patient has met 2 of 4 short term goals. Pt with decline in function d/t increased low back pain impacting pts progression this reporting period. Pt currently requries MOD A for stand pivot transfers with increased time and effort, MOD A for UB dressing from EOM, MAX A for LB dressing, MOD A +2 for toileting tasks. Pt experiences muscle spasms with movement needing to move in short segmental movements with increased pain especially during transitions such as supine<>sit, or sit<>stand. Plan to down grade goals d/t lack of progress.   Patient continues to demonstrate the following deficits: {impairments:3041632} and therefore will continue to benefit from skilled OT intervention to enhance overall performance with {ADL/iADL:3041649}.  Patient {LTG progression:3041653}.  {plan of JJOA:4166063}  OT Short Term Goals Week 1:  OT Short Term Goal 1 (Week 1): Pt to be CGA for functional transfers with LRD OT Short Term Goal 1 - Progress (Week 1): Not met OT Short Term Goal 2 (Week 1): Pt to be Min A for LB dressing/bathing with LRD OT Short Term Goal 2 - Progress (Week 1): Not met OT Short Term Goal 3 (Week 1): Pt to be Min A for toileting tasks with LRD OT Short Term Goal 3 - Progress (Week 1): Not met OT Short Term Goal 4 (Week 1): Pt to demonstrate increased coordination to the R UE during functional tasks OT Short Term Goal 4 - Progress (Week 1): Progressing toward goal Week 2:  OT Short Term Goal 1 (Week 2): Patient will transition from sit to stand with mod assist to prepare for toilet transfer OT Short Term Goal 1 - Progress (Week 2): Met OT Short Term Goal 2 (Week 2): Patient will don pants over feet with no more than min assist OT Short Term Goal  2 - Progress (Week 2): Progressing toward goal OT Short Term Goal 3 (Week 2): Patient will pull pants over hips with no more than mod assists OT Short Term Goal 3 - Progress (Week 2): Progressing toward goal OT Short Term Goal 4 (Week 2): Patient will maintain standing with BUE in support for 10 seconds with mod assist in preparation for LB ADL OT Short Term Goal 4 - Progress (Week 2): Met Week 3:  OT Short Term Goal 1 (Week 3): STG=LTG  Therapy Documentation Precautions:  Precautions Precautions: Fall, Other (comment) Precaution Comments: pt has personal L LE AFO Restrictions Weight Bearing Restrictions: No    Therapy/Group: Individual Therapy  Corinne Ports [facility] 05/17/2022, 7:56 AM

## 2022-05-17 NOTE — Progress Notes (Signed)
Speech Language Pathology Daily Session Note  Patient Details  Name: [name] MRN: [id number] Date of Birth: [birth year]  Today's Date: 05/17/2022 SLP Individual Time: 0902-1000 SLP Individual Time Calculation (min): 58 min  Short Term Goals: Week 3: SLP Short Term Goal 1 (Week 3): STG=LTG due to ELOS  Skilled Therapeutic Interventions: Skilled ST treatment focused on cognitive-communication goals. Pt was received semi-reclined and awake in bed on arrival, accompanied by her spouse. Pt reported feeling ongoing disorientation to time of day and day of the week, especially in the evening when her spouse goes home for the day. SLP facilitated session by providing education on external memory/orientation aids including use of orientation sign in room, calendar, phone, contextual cues. SLP showed pt/spouse large print digital clock that emphasizes DOW/mo/yr and time of day (am/pm) that may be beneficial if disorientation persists at discharge. Also plan to implement memory notebook during future session(s). Pt reported she managed medication independently at prior level. SLP facilitated a medication management task involving identification of organization errors in daily BID pillbox with mod-to-max A verbal/visual cues for attention, organization, and problem solving to achieve 100% accuracy. Patient was left in bed with alarm activated and immediate needs within reach at end of session. Continue per current plan of care.       Pain Pain Assessment Pain Scale: 0-10 Pain Score: 1  Pain Location: Back Pain Orientation: Mid Pain Descriptors / Indicators: Aching Pain Frequency: Occasional Pain Onset: With Activity Pain Intervention(s): Medication (See eMAR)  Therapy/Group: Individual Therapy  Patty Sermons 05/17/2022, 9:14 AM

## 2022-05-18 NOTE — Plan of Care (Signed)
  Problem: RH Balance Goal: LTG Patient will maintain dynamic standing with ADLs (OT) Description: LTG:  Patient will maintain dynamic standing balance with assist during activities of daily living (OT)  Flowsheets (Taken 05/18/2022 1253) LTG: Pt will maintain dynamic standing balance during ADLs with: Minimal Assistance - Patient > 75%

## 2022-05-18 NOTE — Progress Notes (Signed)
Speech Language Pathology Daily Session Note  Patient Details  Name: [name] MRN: [id number] Date of Birth: Nov 01, 1946  Today's Date: 05/18/2022 SLP Individual Time: 5056-9794 SLP Individual Time Calculation (min): 32 min and Today's Date: 05/18/2022 SLP Missed Time: 28 Minutes Missed Time Reason: Pain;Other (Comment) (Delayed start time)  Short Term Goals: Week 3: SLP Short Term Goal 1 (Week 3): STG=LTG due to ELOS  Skilled Therapeutic Interventions: Skilled ST treatment focused on cognitive goals. SLP arrived to pt's room at 1108 and initiated session at 1121 following physical therapy session which was delayed due to severe pain and muscle spasms. Pt was initially in sidelying position and eventually returned to supine with extended time. Pt continues to report decreased awareness of time due to severe pain. SLP reinforced external aids and contextual cues to aid orientation efforts. Pt engaged in functional discussion regarding emergent awareness, however pt reports feeling severely limited by pain that it is difficult to think about anything else. SLP facilitated mindfulness by supporting deep breathing and a quiet/calm environment. Pt requested to conclude session early due pain. Pt missed 32 minutes of skilled ST intervention. Will attempt to see again as time allows. Patient was left in bed with alarm activated and immediate needs within reach at end of session. Spouse at bedside. Continue per current plan of care.      Pain Pain Assessment Pain Scale: 0-10 Pain Score: 3  Faces Pain Scale: Hurts a little bit PAINAD (Pain Assessment in Advanced Dementia) Breathing: normal Negative Vocalization: none Consolability: no need to console  Therapy/Group: Individual Therapy  Patty Sermons 05/18/2022, 3:47 PM

## 2022-05-18 NOTE — Progress Notes (Signed)
Physical Therapy Weekly Progress Note  Patient Details  Name: [name] MRN: [id number] Date of Birth: 04/20/1946  Beginning of progress report period: May 09, 2022 End of progress report period: May 18, 2022  Today's Date: 05/18/2022 PT Individual Time: 6073-7106 and 2694-8546 PT Individual Time Calculation (min): 41 min and 43 min     Patient has met 0 of 4 short term goals. [name] has continued to be severely limited in her participation with mobility tasks due to severe pain. She continues to be limited to primarily bed level mobility and can inconsistently tolerate progression to EOB, sit<>stand, and stand pivot transfers for toileting on BSC. She requires max/total assist for supine<>sit via logroll, max assist sit<>stand transfers with B HHA and posterior lean, and then mod/max assist stand pivot transfers. She has been unable to return to gait training since 05/06/22. Will continue working with the team to address pain management strategies to allow her to return to increased participation in functional mobility because pt is not able to return home at her current level. Pt will benefit from continued CIR level skilled physical therapy.   Patient continues to demonstrate the following deficits muscle weakness and muscle joint tightness, decreased cardiorespiratoy endurance, impaired timing and sequencing, abnormal tone, unbalanced muscle activation, decreased coordination, and decreased motor planning, decreased initiation, decreased attention, decreased awareness, decreased problem solving, decreased safety awareness, decreased memory, and delayed processing, and decreased sitting balance, decreased standing balance, decreased postural control, and decreased balance strategies and therefore will continue to benefit from skilled PT intervention to increase functional independence with mobility.  Patient not progressing toward long term goals due to acute pain onset - will continue to  monitor need for modification of LTGs as pt's pain improves and allows increased participation in therapy.  Continue plan of care.  PT Short Term Goals Week 2:  PT Short Term Goal 1 (Week 2): Pt will perform bed mobility with supervision PT Short Term Goal 1 - Progress (Week 2): Not progressing PT Short Term Goal 2 (Week 2): Pt will perform basic transfers with CGA and LRAD PT Short Term Goal 2 - Progress (Week 2): Not progressing PT Short Term Goal 3 (Week 2): Pt will perform gait x 125' with LRAD and CGA PT Short Term Goal 3 - Progress (Week 2): Not progressing PT Short Term Goal 4 (Week 2): Pt will participated in standardized fall risk assessment PT Short Term Goal 4 - Progress (Week 2): Not progressing Week 3:  PT Short Term Goal 1 (Week 3): Pt will perform supine<>sit with mod assist of 1 PT Short Term Goal 2 (Week 3): Pt will tolerate sitting on EOB for at least 5 minutes for ADL tasks PT Short Term Goal 3 (Week 3): Pt will perform sit<>stand transfers with mod assist of 1 PT Short Term Goal 4 (Week 3): Pt will consistently participate in bed<>chair/BSC transfers with mod assist of 1 PT Short Term Goal 5 (Week 3): Pt will participate in gait training of 40ft using LRAD with mod assist of 1  Skilled Therapeutic Interventions/Progress Updates:  Ambulation/gait training;Community reintegration;DME/adaptive equipment instruction;Neuromuscular re-education;Psychosocial support;Stair training;UE/LE Strength taining/ROM;Balance/vestibular training;Discharge planning;Functional electrical stimulation;Pain management;Skin care/wound management;Therapeutic Activities;UE/LE Coordination activities;Cognitive remediation/compensation;Disease management/prevention;Functional mobility training;Patient/family education;Splinting/orthotics;Therapeutic Exercise;Visual/perceptual remediation/compensation   Session 1: Pt received supine in bed with her husband, [name], present and pt agreeable to therapy  session. Pt reporting her pain is under control while lying still supine in bed using the heating pad. Pt does appear to be  less confused and a little more oriented to the situation; however, some of her comments still don't make sense (ex: pt talks about wishing someone could put a "potty" in her pants so she didn't have to get up).   Pt agreeable to goal of progressing to OOB mobility. Pt attempted supine>sitting R EOB, HOB elevated to 30 degrees, with 3 different attempts as follows:  Initially allowing pt to perform it with her "normal" movement patterns that she utilized prior to onset of her low back pain via moving her LEs off EOB and then pushing her trunk upright (rather than logroll technique) but pt made it ~50% of the way up then had some pain and pt reports she couldn't do it because her L UE wouldn't cooperate to help her come upright Attempted logroll technique using bedrail and allowing pt to do as much of the movement as possible with mod/max assist - once trunk ~80% of the way up pt had severe onset of low back and mid-back pain, pt screaming out in pain and requiring immediate return to supine After rest break and time focused on relaxation/breathing for pain management - attempted a 3rd time with therapist providing gentle, slow max/total assist for trunk upright with goal of decreased pain; however, pt continues to vocalize out in pain and grit her teeth but pt states she wants to try and stay up because she needs to use bathroom - therapist provided dependent trunk support to allow pt to lean posteriorly towards R on EOB to see if the pain would decrease but after <58minute pt reports no improvement in the pain and requires return to supine  Nurse notified and present to discuss pain medication options - PRN pain medication still available - therapist discussed with nurse about how MD note had previously stated "continue PRN pain medications prior to therapy to stay ahead of  pain."  Therapist discussed with pt the option of getting out on L side of the bed to see if this would decrease amount of pain. Supine>L sidelying with pt already having 2x severe R lumbar spine muscle spasms causing her R leg to draw up into flexion therefore did not attempt to transition into sitting, but rather let pt rest in sidelying then returning to supine.  Therapist discussed with [name], [name] regarding pt's severe pain and pt's inability to progress to EOB today because of it. Discussed talking with MD and team about having therapy scheduled around pt's pain medication administration - PA recommends discussing this in team conference tomorrow.   Pt left in the bed in the care of SLP.    Session 2: Pt received supine in bed on bedpan and agreeable to therapy session. Pt's husband exiting upon therapist arrival. Pt rolling R/L in bed to remove bedpan relying on bedrails with min assist and requiring increased time and cuing to focus on breathing/relaxation for pain management with dependent assist peri-care and LB clothing management. Pt's bed soiled due to overflow of bedpan; therefore, goal to progress to OOB mobility to allow linen change. Initiated supine>sitting R EOB via logroll technique with max assist for trunk upright with continued cuing to focus on breathing/relaxation for pain management; however, pt still experiences onset of severe pain in R lower back upon transitioning upright requiring max/total assist for sitting balance and pt becoming tearful stating "it just hurts so bad." Attempted to provide trunk support and continued cuing to focus on breathing/relaxation with goal of decreasing pain to allow her to tolerate upright  position; however, pt reports no improvement in her pain and requires return to supine with max assist for trunk descent and B LE management onto bed. Notified for +2 assist and performed rolling in bed with +2 assist to manage linens. At end of session, pt left  supine in bed with needs in reach, [facility] elevated, and bed alarm on.  Therapy Documentation Precautions:  Precautions Precautions: Fall, Other (comment) Precaution Comments: pt has personal L LE AFO Restrictions Weight Bearing Restrictions: No   Pain:  Session 1: Pt's pain so severe upon attempting to transition to EOB that she was unable to tolerate OOB therapy today - RN present for medication administration - details above.  Session 2: Continues to have low back pain with onset during supine>sit transition preventing pt from being able to tolerate upright, EOB position - details above - unrated but pt's facial expression demonstrates pain >5/10.   Therapy/Group: Individual Therapy  Tawana Scale , PT, DPT, NCS, CSRS 05/18/2022, 7:57 AM

## 2022-05-18 NOTE — Plan of Care (Signed)
  Problem: RH Expression Communication Goal: LTG Patient will increase word finding of common (SLP) Description: LTG:  Patient will increase word finding of common objects/daily info/abstract thoughts with cues using compensatory strategies (SLP). Flowsheets (Taken 05/18/2022 1733) LTG: Patient will increase word finding of common (SLP): Supervision Note: Downgraded due to slow progress and recent decline

## 2022-05-18 NOTE — Progress Notes (Signed)
PROGRESS NOTE   Subjective/Complaints:  Pain improved , moving better     ROS: Pt denies SOB, abd pain, CP, N/V/C/D, and vision changes  Objective:   No results found. No results for input(s): "WBC", "HGB", "HCT", "PLT" in the last 72 hours.   No results for input(s): "NA", "K", "CL", "CO2", "GLUCOSE", "BUN", "CREATININE", "CALCIUM" in the last 72 hours.   Intake/Output Summary (Last 24 hours) at 05/18/2022 0737 Last data filed at 05/17/2022 2000 Gross per 24 hour  Intake 780 ml  Output 97 ml  Net 683 ml         Physical Exam: Vital Signs Blood pressure 100/65, pulse 89, temperature 98.1 F (36.7 C), resp. rate 18, height '5\' 7"'$  (1.702 m), weight 55.4 kg, SpO2 95 %.   General: No acute distress Mood and affect are appropriate Heart: Regular rate and rhythm no rubs murmurs or extra sounds Lungs: Clear to auscultation, breathing unlabored, no rales or wheezes Abdomen: Positive bowel sounds, soft nontender to palpation, nondistended Extremities: No clubbing, cyanosis, or edema Skin: No evidence of breakdown, no evidence of rash   Neuro: CN II-XII intact, motor strength 4/5 bilateral deltoid, bicep, triceps, grip, hip flexor, knee extensors, ankle dorsiflexors/plantar flexors.  Sensory exam normal sensation to light touch lower extremities.  Speech with minimal dysarthria, sentence level, occasional word finding, but appears at baseline. Musculoskeletal: , neg SLR BLE No tenderness or joint swelling L hip or thigh, pain to palp Left lumbar paraspinal  Sensation intact LT Bilateral L3-4-5 S1 dermatomal distribution   Assessment/Plan: 1. Functional deficits which require 3+ hours per day of interdisciplinary therapy in a comprehensive inpatient rehab setting. Physiatrist is providing close team supervision and 24 hour management of active medical problems listed below. Physiatrist and rehab team continue to assess  barriers to discharge/monitor patient progress toward functional and medical goals  Care Tool:  Bathing    Body parts bathed by patient: Front perineal area, Abdomen, Chest, Left arm, Right arm, Face, Left upper leg, Right upper leg   Body parts bathed by helper: Buttocks     Bathing assist Assist Level: Moderate Assistance - Patient 50 - 74%     Upper Body Dressing/Undressing Upper body dressing   What is the patient wearing?: Pull over shirt    Upper body assist Assist Level: Moderate Assistance - Patient 50 - 74%    Lower Body Dressing/Undressing Lower body dressing      What is the patient wearing?: Incontinence brief, Pants     Lower body assist Assist for lower body dressing: Maximal Assistance - Patient 25 - 49%     Toileting Toileting Toileting Activity did not occur (Clothing management and hygiene only): N/A (no void or bm)  Toileting assist Assist for toileting: 2 Helpers (MOD A +2)     Transfers Chair/bed transfer  Transfers assist     Chair/bed transfer assist level: Moderate Assistance - Patient 50 - 74% (face to face stand pivot) Chair/bed transfer assistive device: Programmer, multimedia   Ambulation assist      Assist level: Contact Guard/Touching assist Assistive device: Cane-quad Max distance: 24f   Walk 10 feet activity  Assist     Assist level: Contact Guard/Touching assist Assistive device: Cane-quad   Walk 50 feet activity   Assist    Assist level: Contact Guard/Touching assist Assistive device: Cane-quad    Walk 150 feet activity   Assist Walk 150 feet activity did not occur: Safety/medical concerns         Walk 10 feet on uneven surface  activity   Assist     Assist level: Contact Guard/Touching assist Assistive device: Cane-quad   Wheelchair     Assist Is the patient using a wheelchair?: No (only for time management due to slow gait speed)             Wheelchair 50 feet with  2 turns activity    Assist            Wheelchair 150 feet activity     Assist          Blood pressure 100/65, pulse 89, temperature 98.1 F (36.7 C), resp. rate 18, height '5\' 7"'$  (1.702 m), weight 55.4 kg, SpO2 95 %.  Medical Problem List and Plan: 1. 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  Antithrombotics: -DVT/anticoagulation: Coumadin resumed 04/29/2022 with bridging of Lovenox.  INR goal 2.5-3.5, INR still low , INR 2.5   3. Pain: lumbar scoliosis and stenosis   Lumbar xray no new changes vs MRI 5/23 Now on Nucynta '100mg'$  Q6h 4. Mood/Sleep: Provide emotional support             -antipsychotic agents: N/A 5. Neuropsych/cognition: This patient is capable of making decisions on her own behalf. 6. Skin/Wound Care: Routine skin checks 7. Fluids/Electrolytes/Nutrition: Routine in and outs with follow-up chemistries 8.  History of mitral valve replacement 1997.  Resuming Coumadin with bridging of Lovenox 04/29/2022.  Follow-up per cardiology services as needed -7/6 INR above goal, coumadin held today, monitor for bleeding 7/7 pharmacy adjusting coumadin dose 9.  Hyperlipidemia.  Continue Lipitor 10.  Hypertension.  Continue Norvasc 5 mg daily Vitals:   05/17/22 1938 05/18/22 0433  BP: 108/60 100/65  Pulse: (!) 101 89  Resp: 19 18  Temp: 97.9 F (36.6 C) 98.1 F (36.7 C)  SpO2: 98% 95%   7/11 controlled  11.  Prediabetes.  Hemoglobin A1c 6.1.  Diet controlled/SSI discontinued 12.  History of lumbar spinal stenosis  with increased radicular pain , would not rec reschedule  ESI due to anticoag issues -would not push distances for ambulation  13.  GERD.  Protonix 14. Insomnia She plans to find out what medication she takes at  home for consideration of use at the hospital 15. Hypokalemia  -KCL 32mq x2 , recheck     Latest Ref Rng & Units 05/04/2022    6:10 AM 05/03/2022    6:16 AM 04/30/2022   12:26 AM  BMP  Glucose 70 - 99 mg/dL 122  118  135   BUN 8 - 23 mg/dL '20  19  20   '$ Creatinine 0.44 - 1.00 mg/dL 0.87  0.73  0.75   Sodium 135 - 145 mmol/L 142  138  141  Potassium 3.5 - 5.1 mmol/L 4.1  3.4  3.7   Chloride 98 - 111 mmol/L 106  105  109   CO2 22 - 32 mmol/L '29  28  26   '$ Calcium 8.9 - 10.3 mg/dL 9.7  9.1  9.2    7/2 K+ at 4.1 (6/27), trend with weekly labs 16. Anemia mild  -Continue to monitor    Latest Ref Rng & Units 05/13/2022    5:23 AM 05/03/2022    6:16 AM 04/30/2022   12:26 AM  CBC  WBC 4.0 - 10.5 K/uL 6.4  4.4  6.0   Hemoglobin 12.0 - 15.0 g/dL 11.0  11.4  11.9   Hematocrit 36.0 - 46.0 % 34.9  35.8  37.8   Platelets 150 - 400 K/uL 244  149  171    continue to follow with weekly labs    LOS: 18 days A FACE TO FACE EVALUATION WAS PERFORMED  Charlett Blake 05/18/2022, 7:37 AM

## 2022-05-18 NOTE — Progress Notes (Addendum)
Occupational Therapy Session Note  Patient Details  Name: [name] MRN: [id number] Date of Birth: 1946-02-09  Today's Date: 05/18/2022 OT Individual Time: 3524-8185 OT Individual Time Calculation (min): 15 min  and Today's Date: 05/18/2022 OT Missed Time: 30 Minutes Missed Time Reason: Pain   Short Term Goals: Week 1:  OT Short Term Goal 1 (Week 1): Pt to be CGA for functional transfers with LRD OT Short Term Goal 1 - Progress (Week 1): Not met OT Short Term Goal 2 (Week 1): Pt to be Min A for LB dressing/bathing with LRD OT Short Term Goal 2 - Progress (Week 1): Not met OT Short Term Goal 3 (Week 1): Pt to be Min A for toileting tasks with LRD OT Short Term Goal 3 - Progress (Week 1): Not met OT Short Term Goal 4 (Week 1): Pt to demonstrate increased coordination to the R UE during functional tasks OT Short Term Goal 4 - Progress (Week 1): Progressing toward goal Week 2:  OT Short Term Goal 1 (Week 2): Patient will transition from sit to stand with mod assist to prepare for toilet transfer OT Short Term Goal 1 - Progress (Week 2): Met OT Short Term Goal 2 (Week 2): Patient will don pants over feet with no more than min assist OT Short Term Goal 2 - Progress (Week 2): Progressing toward goal OT Short Term Goal 3 (Week 2): Patient will pull pants over hips with no more than mod assists OT Short Term Goal 3 - Progress (Week 2): Progressing toward goal OT Short Term Goal 4 (Week 2): Patient will maintain standing with BUE in support for 10 seconds with mod assist in preparation for LB ADL OT Short Term Goal 4 - Progress (Week 2): Met  Skilled Therapeutic Interventions/Progress Updates:    Pt received semi-reclined in bed finishing getting off bed pan with NT. Initially reporting "waist pain." Declining OOB activity/ADL due to pain. Could not recall if she had already received pain medication this morning - RN later present to confirm that she did not and present to administer.    Elevated HOB to improve posture to be able to eat breakfast, only able to tolerate up to ~40 degrees due to increase in LBP. Offered distraction, deep breathing, and heating pad to facilitiate pain relief with pt not receptive at this time due to severity of pain. Requesting that husband feed her breakfast and politely declining additional participation at this time. Pt missed 30 min of OT due to pain.  Pt left semi-reclined in bed with RN present to administer pain rx, husband present,  with bed alarm engaged, call bell in reach, and all immediate needs met.    Therapy Documentation Precautions:  Precautions Precautions: Fall, Other (comment) Precaution Comments: pt has personal L LE AFO Restrictions Weight Bearing Restrictions: No  Pain:   LBP, unrated, see above  ADL: See Care Tool for more details.  Therapy/Group: Individual Therapy  Volanda Napoleon MS, OTR/L  05/18/2022, 6:52 AM

## 2022-05-19 MED ORDER — TAPENTADOL HCL 50 MG PO TABS
50.0000 mg | ORAL_TABLET | Freq: Four times a day (QID) | ORAL | Status: DC
  Administered 2022-05-19 – 2022-05-20 (×4): 50 mg via ORAL
  Filled 2022-05-19 (×4): qty 1

## 2022-05-19 MED ORDER — METHOCARBAMOL 500 MG PO TABS
500.0000 mg | ORAL_TABLET | Freq: Three times a day (TID) | ORAL | Status: DC
  Administered 2022-05-19 – 2022-06-01 (×40): 500 mg via ORAL
  Filled 2022-05-19 (×40): qty 1

## 2022-05-19 MED ORDER — TAPENTADOL HCL ER 100 MG PO TB12: 100.0000 mg | ORAL_TABLET | Freq: Two times a day (BID) | ORAL | Status: DC

## 2022-05-19 MED ORDER — LIDOCAINE HCL (PF) 1 % IJ SOLN
5.0000 mL | Freq: Once | INTRAMUSCULAR | Status: AC
  Administered 2022-05-19: 5 mL
  Filled 2022-05-19: qty 30

## 2022-05-19 NOTE — Progress Notes (Signed)
Speech Language Pathology Daily Session Note  Patient Details  Name: [name] MRN: [id number] Date of Birth: 13-Jul-1946  Today's Date: 05/19/2022 SLP Individual Time: 1340-1435 SLP Individual Time Calculation (min): 55 min  Short Term Goals: Week 3: SLP Short Term Goal 1 (Week 3): STG=LTG due to ELOS  Skilled Therapeutic Interventions: Skilled treatment session focused on cognitive goals. Patient missed initial 10 minutes of session due to being on the bedpan. Patient agreeable to treatment session while semi-reclined in bed. SLP facilitated session by providing education regarding her current medications and their functions with Max A multimodal cues needed for recall. SLP also provided education regarding how to implement a medication organization at home to compensate for new medications and increased amount of medications throughout the day. After discussion with the patient and her husband, decided to utilize previously used containers with written aids to maximize recall of time when taken. SLP also utilized a written calendar to maximize recall of date with focus on her crossing out the date vs reading the date passively on the board that staff has been updating intermittently. Throughout session, patient without evidence of word-finding deficits. Patient left upright in bed with alarm on and all needs within reach. Continue with current plan of care.      Pain No indications of pain throughout session   Therapy/Group: Individual Therapy  Aster Eckrich 05/19/2022, 3:16 PM

## 2022-05-19 NOTE — Progress Notes (Signed)
Trigger Point Injection  Indication: RIght upper lumbar paraspinal  Myofascial pain not relieved by medication management and other conservative care.  Informed consent was obtained after describing risk and benefits of the procedure with the patient, this includes bleeding, bruising, infection and medication side effects.  The patient wishes to proceed and has given written consent.  The patient was placed in a Left lateral decubitus position.  The Right L1-2-3 paraspinal  area was marked and prepped with Betadine.  It was entered with a 25-gauge 1-1/2 inch needle and 1 mL of 1% lidocaine was injected into each of 3 trigger points, after negative draw back for blood.  The patient tolerated the procedure well.  Post procedure instructions were given.  Dr Letta Pate perfromed L1 Dr [place] Nickel performed L2 and L3  under direct bedside supervision

## 2022-05-19 NOTE — Progress Notes (Signed)
Patient ID: [name], female   DOB: Sep 24, 1946, 76 y.o.   MRN: [id number] Team Conference Report to Patient/Family  Team Conference discussion was reviewed with the patient and caregiver, including goals, any changes in plan of care and target discharge date.  Patient and caregiver express understanding and are in agreement.  The patient has a target discharge date of 05/27/22.  SW met with patient and spouse to provide conference updates. Physician updated them both on extension. No additional questions or concerns.   [name] 05/19/2022, 1:04 PM

## 2022-05-19 NOTE — Patient Care Conference (Signed)
Inpatient RehabilitationTeam Conference and Plan of Care Update Date: 05/19/2022   Time: 10:34 AM    Patient Name: [name]      Medical Record Number: [id number]  Date of Birth: Jun 23, 1946 Sex: Female         Room/Bed: 4M03C/4M03C-01 Payor Info: Payor: MEDICARE / Plan: MEDICARE PART A AND B / Product Type: *No Product type* /    Admit Date/Time:  04/30/2022  4:11 PM  Primary Diagnosis:  SAH (subarachnoid hemorrhage) [facility])  Hospital Problems: Principal Problem:   SAH (subarachnoid hemorrhage) (HCC) Active Problems:   Subarachnoid hemorrhage [facility])    Expected Discharge Date: Expected Discharge Date: 05/27/22  Team Members Present: Physician leading conference: Dr. Alysia Penna Social Worker Present: Erlene Quan, BSW Nurse Present: Dorien Chihuahua, RN PT Present: Page Spiro, PT OT Present: Providence Lanius, OT SLP Present: Sherren Kerns, SLP PPS Coordinator present : Ileana Ladd, Burna Mortimer, SLP     Current Status/Progress Goal Weekly Team Focus  Bowel/Bladder     Cont during the day; incont at [facility]   continent   toileting  Swallow/Nutrition/ Hydration             ADL's   did not t/f OOB day before conference due to LBP (had not yet had pain rx, forgot to request); appears to be able to do mod A stand-pivot when premedicated, mod A of 2 for toileting at [employer], max A LBD, total A for self-feeding  downgraded to min ADL/transfers, mod LBD  BADL Reeducation, functional mobility to tolerance, nonpharm pain management   Mobility   pt continues to have severe low back pain limiting ability to participate in OOB mobility - she is performing rolling R/L using bedrails with min/mod assist, sit<>supine via logroll with max assist, sit<>stands with B HHA and max assist, stand pivot transfers with B HHA mod/max assist - she has yet to be able to progress to gait training again - will require additional extension to LOS and further support for pain management   supervision overall at ambulatory level transfers - will leave goals at this time until pain improves to determine what change is necessary and appropriate  pain management, activity tolerance, pt/family education, bed mobility training, transfer training, sitting tolerance and balance, standing tolerance and balance   Communication   sup A  sup A  word finding - cont to address cognitive goals including attention and memory to aid overall communication   Safety/Cognition/ Behavioral Observations  min A  min A  orientation aids, basic problem solving, functional recall with use of compensatory strategies   Pain     Back pain issues   Pain controlled   Changed from prn to scheduled meds with muscle relaxer. MD to address trigger points  Skin     N/a          Discharge Planning:  Patient ready for d/c   Team Discussion: Patient has declined since back pain increased; limited activity tolerance. Spouse worried as he cannot provide physical assist.Patient needing more assistance, and function limited by anxiety, emotionally distracted.   Patient on target to meet rehab goals: no, currently limited by poin in back; MD adjusted meds, trigger injections for lumbar area. Needs mod assist for stand pivot and toileitng. Needs max assist for lower body care and total assist of others for self feeding.   *See Care Plan and progress notes for long and short-term goals.   Revisions to Treatment Plan:  Downgraded PT goals   Teaching  Needs: Safety, transfers, toileting, medications, secondary risk management  Current Barriers to Discharge: Decreased caregiver support, Home enviroment access/layout, and Incontinence  Possible Resolutions to Barriers: SNF options brought up for recommendation OP follow up services initially recommended     Medical Summary               I attest that I was present, lead the team conference, and concur with the assessment and plan of the team.   Dorien Chihuahua B 05/19/2022, 3:27 PM

## 2022-05-19 NOTE — Progress Notes (Addendum)
PROGRESS NOTE   Subjective/Complaints:  Pain inhibiting movement , reviewed MAR, only took 2 Nucynta yesterday and 1 the previous day .  Also not taking valium regularly     ROS: Pt denies SOB, abd pain, CP, N/V/C/D, and vision changes  Objective:   No results found. No results for input(s): "WBC", "HGB", "HCT", "PLT" in the last 72 hours.   No results for input(s): "NA", "K", "CL", "CO2", "GLUCOSE", "BUN", "CREATININE", "CALCIUM" in the last 72 hours.   Intake/Output Summary (Last 24 hours) at 05/19/2022 0805 Last data filed at 05/19/2022 0530 Gross per 24 hour  Intake 440 ml  Output 250 ml  Net 190 ml         Physical Exam: Vital Signs Blood pressure 120/66, pulse 94, temperature 98.5 F (36.9 C), temperature source Oral, resp. rate 16, height '5\' 7"'$  (1.702 m), weight 55.4 kg, SpO2 96 %.   General: No acute distress Mood and affect are appropriate Heart: Regular rate and rhythm no rubs murmurs or extra sounds Lungs: Clear to auscultation, breathing unlabored, no rales or wheezes Abdomen: Positive bowel sounds, soft nontender to palpation, nondistended Extremities: No clubbing, cyanosis, or edema Skin: No evidence of breakdown, no evidence of rash   Neuro: CN II-XII intact, motor strength 4/5 bilateral deltoid, bicep, triceps, grip, hip flexor, knee extensors, ankle dorsiflexors/plantar flexors.  Sensory exam normal sensation to light touch lower extremities.  Speech with minimal dysarthria, sentence level, occasional word finding, but appears at baseline. Musculoskeletal: , neg SLR BLE No tenderness or joint swelling L hip or thigh, pain to palp Left lumbar paraspinal  Sensation intact LT Bilateral L3-4-5 S1 dermatomal distribution   Assessment/Plan: 1. Functional deficits which require 3+ hours per day of interdisciplinary therapy in a comprehensive inpatient rehab setting. Physiatrist is providing close  team supervision and 24 hour management of active medical problems listed below. Physiatrist and rehab team continue to assess barriers to discharge/monitor patient progress toward functional and medical goals  Care Tool:  Bathing    Body parts bathed by patient: Front perineal area, Abdomen, Chest, Left arm, Right arm, Face, Left upper leg, Right upper leg   Body parts bathed by helper: Buttocks     Bathing assist Assist Level: Moderate Assistance - Patient 50 - 74%     Upper Body Dressing/Undressing Upper body dressing   What is the patient wearing?: Pull over shirt    Upper body assist Assist Level: Moderate Assistance - Patient 50 - 74%    Lower Body Dressing/Undressing Lower body dressing      What is the patient wearing?: Incontinence brief, Pants     Lower body assist Assist for lower body dressing: Maximal Assistance - Patient 25 - 49%     Toileting Toileting Toileting Activity did not occur (Clothing management and hygiene only): N/A (no void or bm)  Toileting assist Assist for toileting: 2 Helpers (MOD A +2)     Transfers Chair/bed transfer  Transfers assist     Chair/bed transfer assist level: Moderate Assistance - Patient 50 - 74% (face to face stand pivot) Chair/bed transfer assistive device: Programmer, multimedia   Ambulation assist  Assist level: Contact Guard/Touching assist Assistive device: Cane-quad Max distance: 233f   Walk 10 feet activity   Assist     Assist level: Contact Guard/Touching assist Assistive device: Cane-quad   Walk 50 feet activity   Assist    Assist level: Contact Guard/Touching assist Assistive device: Cane-quad    Walk 150 feet activity   Assist Walk 150 feet activity did not occur: Safety/medical concerns         Walk 10 feet on uneven surface  activity   Assist     Assist level: Contact Guard/Touching assist Assistive device: Cane-quad   Wheelchair     Assist Is  the patient using a wheelchair?: No (only for time management due to slow gait speed)             Wheelchair 50 feet with 2 turns activity    Assist            Wheelchair 150 feet activity     Assist          Blood pressure 120/66, pulse 94, temperature 98.5 F (36.9 C), temperature source Oral, resp. rate 16, height '5\' 7"'$  (1.702 m), weight 55.4 kg, SpO2 96 %.  Medical Problem List and Plan: 1. 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  Antithrombotics: -DVT/anticoagulation: Coumadin resumed 04/29/2022 with bridging of Lovenox.  INR goal 2.5-3.5, INR still low , INR 2.5   3. Pain: lumbar scoliosis and stenosis , myofascial component ,   Lumbar xray no new changes vs MRI 5/23, although a small vertebral compression fracture may cause exacerbation of sx,  no MRI ordered since pt would need to be sedated with IV and no change in treatment would be necessary  Now on Nucynta '50mg'$  po QID, monitor effect may need to increase to '75mg'$  4. Mood/Sleep: Provide emotional support             -antipsychotic agents: N/A 5. Neuropsych/cognition: This patient is capable of making decisions on her own behalf. 6. Skin/Wound Care: Routine skin checks 7. Fluids/Electrolytes/Nutrition: Routine in and outs with follow-up chemistries 8.  History of mitral valve replacement 1997.  Resuming Coumadin with bridging of Lovenox 04/29/2022.  Follow-up per cardiology services as needed -7/6 INR above goal, coumadin held today, monitor for bleeding 7/7 pharmacy adjusting coumadin dose 9.  Hyperlipidemia.  Continue Lipitor 10.  Hypertension.  Continue Norvasc 5 mg daily Vitals:   05/18/22 1949 05/19/22 0521  BP: 119/86 120/66  Pulse: (!)  107 94  Resp: 18 16  Temp: 98.2 F (36.8 C) 98.5 F (36.9 C)  SpO2: 95% 96%   7/12 controlled  11.  Prediabetes.  Hemoglobin A1c 6.1.  Diet controlled/SSI discontinued 12.  History of lumbar spinal stenosis  with increased radicular pain , would not rec reschedule  ESI due to anticoag issues -would not push distances for ambulation  13.  GERD.  Protonix 14. Insomnia She plans to find out what medication she takes at home for consideration of use at the hospital 15. Hypokalemia  -KCL 249m x2 , recheck     Latest  Ref Rng & Units 05/04/2022    6:10 AM 05/03/2022    6:16 AM 04/30/2022   12:26 AM  BMP  Glucose 70 - 99 mg/dL 122  118  135   BUN 8 - 23 mg/dL '20  19  20   '$ Creatinine 0.44 - 1.00 mg/dL 0.87  0.73  0.75   Sodium 135 - 145 mmol/L 142  138  141   Potassium 3.5 - 5.1 mmol/L 4.1  3.4  3.7   Chloride 98 - 111 mmol/L 106  105  109   CO2 22 - 32 mmol/L '29  28  26   '$ Calcium 8.9 - 10.3 mg/dL 9.7  9.1  9.2    7/2 K+ at 4.1 (6/27), trend with weekly labs 16. Anemia mild  -Continue to monitor    Latest Ref Rng & Units 05/13/2022    5:23 AM 05/03/2022    6:16 AM 04/30/2022   12:26 AM  CBC  WBC 4.0 - 10.5 K/uL 6.4  4.4  6.0   Hemoglobin 12.0 - 15.0 g/dL 11.0  11.4  11.9   Hematocrit 36.0 - 46.0 % 34.9  35.8  37.8   Platelets 150 - 400 K/uL 244  149  171    continue to follow with weekly labs    LOS: 19 days A FACE TO [place] E Lelani Garnett 05/19/2022, 8:05 AM

## 2022-05-19 NOTE — Progress Notes (Signed)
Physical Therapy Session Note  Patient Details  Name: [name] MRN: [id number] Date of Birth: Apr 19, 1946  Today's Date: 05/19/2022 PT Individual Time: 0805-0916 PT Individual Time Calculation (min): 71 min   Short Term Goals: Week 3:  PT Short Term Goal 1 (Week 3): Pt will perform supine<>sit with mod assist of 1 PT Short Term Goal 2 (Week 3): Pt will tolerate sitting on EOB for at least 5 minutes for ADL tasks PT Short Term Goal 3 (Week 3): Pt will perform sit<>stand transfers with mod assist of 1 PT Short Term Goal 4 (Week 3): Pt will consistently participate in bed<>chair/BSC transfers with mod assist of 1 PT Short Term Goal 5 (Week 3): Pt will participate in gait training of 55f using LRAD with mod assist of 1  Skilled Therapeutic Interventions/Progress Updates:    Pt received supine in bed with nurse present for medication administration, including pain meds. Pt agreeable to therapy session but reporting increased pain this AM and is experiencing some relief from use of heating pad. Pt reports that she had severe onset of pain even when MD attempted to flatten the [facility] and also states she could hardly move her L LE due to the pain. Therapist started session with very slow, bed level LB dressing to initiate movement in a pain free ROM via threading on her pants total assist and then donning socks and shoes with LLE PLS AFO total assist. Initiated transitioning to sitting EOB via logroll towards R with min/mod assist for rolling and cuing to maintain spine alignment to avoid increase in pain - this was tolerable with no severe pain noted and pt demos improved ability to flex LEs into hooklying position better than she described it during MD morning rounds. R sidelying>sitting EOB, HOB elevated 30 degrees, with max assist for LB management and trunk upright with slow movements and focus on her breathing for pain management - pt transitions to partial upright position supporting  herself with R forearm - slowly attempted to transition to more upright posture with therapist providing support at her trunk; however, pt with sudden sharp/grabbing pain causing her to tense up in a quick "jerk" response to the pain and grimacing. Attempted to provide support and allow pt to relax her muscles in sitting but pt continues to report significant pain and is tearful therefore returned to supine via reverse logroll. Remainder of therapy session spent on performing gentle rolling into partial R/L sidelying and performing alternating B LE heel slides x3 reps total with continued cuing to focus on her breathing and slow, controlled movements to avoid sudden onset of pain. At end of session, pt left supine in bed with needs in reach, bed alarm on, and her husband present.     Therapy Documentation Precautions:  Precautions Precautions: Fall, Other (comment) Precaution Comments: pt has personal L LE AFO Restrictions Weight Bearing Restrictions: No   Pain:  Continues to have severe low back pain preventing her from tolerating sitting EOB today - modified session as described above for pain management - nurse present at beginning of session for medication administration.    Therapy/Group: Individual Therapy  CTawana Scale, PT, DPT, NCS, CSRS 05/19/2022, 7:54 AM

## 2022-05-19 NOTE — Progress Notes (Signed)
Occupational Therapy Session Note  Patient Details  Name: [name] MRN: [id number] Date of Birth: Mar 20, 1946  Today's Date: 05/19/2022 OT Individual Time: 1105-1220 OT Individual Time Calculation (min): 75 min    Short Term Goals: Week 2:  OT Short Term Goal 1 (Week 2): Patient will transition from sit to stand with mod assist to prepare for toilet transfer OT Short Term Goal 1 - Progress (Week 2): Met OT Short Term Goal 2 (Week 2): Patient will don pants over feet with no more than min assist OT Short Term Goal 2 - Progress (Week 2): Progressing toward goal OT Short Term Goal 3 (Week 2): Patient will pull pants over hips with no more than mod assists OT Short Term Goal 3 - Progress (Week 2): Progressing toward goal OT Short Term Goal 4 (Week 2): Patient will maintain standing with BUE in support for 10 seconds with mod assist in preparation for LB ADL OT Short Term Goal 4 - Progress (Week 2): Met  Skilled Therapeutic Interventions/Progress Updates:  Pt awake in bed with spouse present upon OT arrival to the room. Pt reports, "How do I stop expecting that (pain)?" Pt in agreement for OT session.   Therapy Documentation Precautions:  Precautions Precautions: Fall, Other (comment) Precaution Comments: pt has personal L LE AFO Restrictions Weight Bearing Restrictions: No Vital Signs: Please see "Flowsheet" for most recent vitals charted by nursing staff. Pain:  05/19/22 1105  Pain Assessment  Pain Scale 0-10  Pain Score 3  Pain Type Acute pain  Pain Location Back  Pain Orientation Lower  Pain Descriptors / Indicators Sharp  Pain Onset On-going  Pain Intervention(s) Distraction;Emotional support (pt already on heating pad)  Multiple Pain Sites No    ADL: Toileting: Dependent (Pt requires 2-person assist to safely perform 3/3 toileting tasks after using the bed pan. Pt unable to tolerate mobility to transfer to [facility] today.) Where Assessed-Toileting: Bed level ADL  Comments: Pt demo's significant pain levles in lower back today which limit pt's ability to tolerate mobility and ADLs even at bed level. Education provided to pt on proper positioning at bed level and pain management techniques. Education provided to pt and spouse that performing ADLs at bed level could potentially decrease need for caregiver level of assist and risk of injury as well as improving pt's safety. However, pt unable to tolerate participating in LB dressing due to pain levels after mobility/toileting tasks.   Bed Mobility: Pt participates in bed mobility in order to improve independence with bed mobility tasks. Education provided to pt on proper techniques and log roll techniques to aid in decreasing back pain. Pt requires moderate assistance to roll to B sides with strong use of bed rails. OT provides education and assistance with bed mobility while MD performs trigger point injections. Pain management/breathing technique education provided during this.   Pain Management: Education provided to pt on breathing techniques to assist with managing pain. OT provides education on importance and technique for pursed-lip breathing. [name] with moderate VC's. Education provided to pt on techniques that can assist with pain management as well which includes listening to soothing music, visualization, etc. Encouraged pt to trial these techniques in future sessions.   Functional Mobility:  Encouraged pt to participate in supine <> sitting at EOB. Education provided to pt on techniques to perform log roll and transition to sitting at EOB. Pt attempts on first trial to use RUE to push up on bed rail, however, pt unable  to transition RUE from bed rail to then at EOB which caused pt to pause midway and pt experienced high levels of pain. Education provided to pt on allowing staff to provide assistance to then perforn smooth movements. Pt then able to perform supine <> sit with 2-person assist and  tolerate sitting at EOB for approx 1 min until requesting to lay down due to high pain levels. Pt unable to transition to [facility] after this due to significant pain levels and requested to use bed pan for toileting.   Pt requested to stay in the w/c at end of session. Pt left sitting comfortably in the w/c with personal belongings and call light within reach, spouse present in room to assist with lunch, belt alarm placed and activated, and comfort needs attended to.   Therapy/Group: Individual Therapy  [name] 05/19/2022, 4:35 PM

## 2022-05-20 LAB — PROTIME-INR
INR: 3.1 — ABNORMAL HIGH (ref 0.8–1.2)
Prothrombin Time: 31.6 seconds — ABNORMAL HIGH (ref 11.4–15.2)

## 2022-05-20 MED ORDER — TAPENTADOL HCL 50 MG PO TABS
75.0000 mg | ORAL_TABLET | Freq: Four times a day (QID) | ORAL | Status: DC
  Administered 2022-05-20 – 2022-05-24 (×16): 75 mg via ORAL
  Filled 2022-05-20 (×16): qty 2

## 2022-05-20 NOTE — Progress Notes (Signed)
Speech Language Pathology Daily Session Note  Patient Details  Name: [name] MRN: [id number] Date of Birth: 10-23-1946  Today's Date: 05/20/2022 SLP Individual Time: 1400-1445 SLP Individual Time Calculation (min): 45 min  Short Term Goals: Week 3: SLP Short Term Goal 1 (Week 3): STG=LTG due to ELOS  Skilled Therapeutic Interventions: Skilled ST treatment focused on cognitive-communication goals. Patient was semi-reclined in bed upon arrival and accompanied by spouse. Pt requested to use urinal but was unable to void during session despite attempts and extended time. Lunch tray was untouched on bedside table. Pt receptive to encouragement to eat and consumed <25%. Pt consumed ~86m during session. Pt was oriented x4 with mod I for use of external aids. SLP facilitated mildly complex sustained attention, verbal reasoning, and working memory task with min A verbal cues progressing to mod A at end of session likely attributed to fatigue. Reviewed beneficial internal and external memory strategies in which pt verbalized understanding through teach back with sup A cues. Patient was left in bed with alarm activated and immediate needs within reach at end of session. Spouse at bedside. Continue per current plan of care.      Pain No indications of pain throughout session   Therapy/Group: Individual Therapy  BPatty Sermons7/13/2023, 5:01 PM

## 2022-05-20 NOTE — Progress Notes (Signed)
PROGRESS NOTE   Subjective/Complaints:  Mainly Left sided sacral pain  No right sided pain complaints, pain in LLE  with ext    ROS: Pt denies SOB, abd pain, CP, N/V/C/D, and vision changes  Objective:   No results found. No results for input(s): "WBC", "HGB", "HCT", "PLT" in the last 72 hours.   No results for input(s): "NA", "K", "CL", "CO2", "GLUCOSE", "BUN", "CREATININE", "CALCIUM" in the last 72 hours.   Intake/Output Summary (Last 24 hours) at 05/20/2022 0734 Last data filed at 05/19/2022 1856 Gross per 24 hour  Intake 533 ml  Output --  Net 533 ml         Physical Exam: Vital Signs Blood pressure 125/86, pulse 96, temperature 97.9 F (36.6 C), temperature source Oral, resp. rate 16, height '5\' 7"'$  (1.702 m), weight 55.4 kg, SpO2 96 %.   General: No acute distress Mood and affect are appropriate Heart: Regular rate and rhythm no rubs murmurs or extra sounds Lungs: Clear to auscultation, breathing unlabored, no rales or wheezes Abdomen: Positive bowel sounds, soft nontender to palpation, nondistended Extremities: No clubbing, cyanosis, or edema Skin: No evidence of breakdown, no evidence of rash   Neuro: CN II-XII intact, motor strength 4/5 bilateral deltoid, bicep, triceps, grip, hip flexor, knee extensors, ankle dorsiflexors/plantar flexors.  Sensory exam normal sensation to light touch lower extremities.  Speech with minimal dysarthria, sentence level, occasional word finding, but appears at baseline. Musculoskeletal: ,Back spasm below waist when extending LLE  Sensation intact LT Bilateral L3-4-5 S1 dermatomal distribution   Assessment/Plan: 1. Functional deficits which require 3+ hours per day of interdisciplinary therapy in a comprehensive inpatient rehab setting. Physiatrist is providing close team supervision and 24 hour management of active medical problems listed below. Physiatrist and rehab  team continue to assess barriers to discharge/monitor patient progress toward functional and medical goals  Care Tool:  Bathing    Body parts bathed by patient: Front perineal area, Abdomen, Chest, Left arm, Right arm, Face, Left upper leg, Right upper leg   Body parts bathed by helper: Buttocks     Bathing assist Assist Level: Moderate Assistance - Patient 50 - 74%     Upper Body Dressing/Undressing Upper body dressing   What is the patient wearing?: Pull over shirt    Upper body assist Assist Level: Moderate Assistance - Patient 50 - 74%    Lower Body Dressing/Undressing Lower body dressing      What is the patient wearing?: Incontinence brief, Pants     Lower body assist Assist for lower body dressing: Maximal Assistance - Patient 25 - 49%     Toileting Toileting Toileting Activity did not occur (Clothing management and hygiene only): N/A (no void or bm)  Toileting assist Assist for toileting: 2 Helpers (MOD A +2)     Transfers Chair/bed transfer  Transfers assist     Chair/bed transfer assist level: Moderate Assistance - Patient 50 - 74% (face to face stand pivot) Chair/bed transfer assistive device: Programmer, multimedia   Ambulation assist      Assist level: Contact Guard/Touching assist Assistive device: Cane-quad Max distance: 231f   Walk 10 feet activity  Assist     Assist level: Contact Guard/Touching assist Assistive device: Cane-quad   Walk 50 feet activity   Assist    Assist level: Contact Guard/Touching assist Assistive device: Cane-quad    Walk 150 feet activity   Assist Walk 150 feet activity did not occur: Safety/medical concerns         Walk 10 feet on uneven surface  activity   Assist     Assist level: Contact Guard/Touching assist Assistive device: Cane-quad   Wheelchair     Assist Is the patient using a wheelchair?: No (only for time management due to slow gait speed)              Wheelchair 50 feet with 2 turns activity    Assist            Wheelchair 150 feet activity     Assist          Blood pressure 125/86, pulse 96, temperature 97.9 F (36.6 C), temperature source Oral, resp. rate 16, height '5\' 7"'$  (1.702 m), weight 55.4 kg, SpO2 96 %.  Medical Problem List and Plan: 1. 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  Antithrombotics: -DVT/anticoagulation: Coumadin resumed 04/29/2022 with bridging of Lovenox.  INR goal 2.5-3.5, INR still low , INR 2.5   3. Pain: lumbar scoliosis and stenosis , myofascial component ,   Lumbar xray no new changes vs MRI 5/23, although a small vertebral compression fracture may cause exacerbation of sx,  no MRI ordered since pt would need to be sedated with IV and no change in treatment would be necessary  More sacral pain , check CT for sacral insuff fx although tx would be conservative  Now on Nucynta '50mg'$  po QID,  increase to '75mg'$  4. Mood/Sleep: Provide emotional support             -antipsychotic agents: N/A 5. Neuropsych/cognition: This patient is capable of making decisions on her own behalf. 6. Skin/Wound Care: Routine skin checks 7. Fluids/Electrolytes/Nutrition: Routine in and outs with follow-up chemistries 8.  History of mitral valve replacement 1997.  Resuming Coumadin with bridging of Lovenox 04/29/2022.  Follow-up per cardiology services as needed -7/6 INR above goal, coumadin held today, monitor for bleeding 7/7 pharmacy adjusting coumadin dose 9.  Hyperlipidemia.  Continue Lipitor 10.  Hypertension.  Continue Norvasc 5 mg daily Vitals:   05/19/22 2152 05/20/22 0637  BP: 115/72 125/86  Pulse: 93 96  Resp: 18 16  Temp: 97.6 F (36.4 C)  97.9 F (36.6 C)  SpO2: 96% 96%   7/12 controlled  11.  Prediabetes.  Hemoglobin A1c 6.1.  Diet controlled/SSI discontinued 12.  History of lumbar spinal stenosis  with increased radicular pain , would not rec reschedule  ESI due to anticoag issues -would not push distances for ambulation  13.  GERD.  Protonix 14. Insomnia She plans to find out what medication she takes at home for consideration of use at the hospital 15. Hypokalemia  -KCL 71mq x2 , recheck     Latest Ref Rng & Units 05/04/2022  6:10 AM 05/03/2022    6:16 AM 04/30/2022   12:26 AM  BMP  Glucose 70 - 99 mg/dL 122  118  135   BUN 8 - 23 mg/dL '20  19  20   '$ Creatinine 0.44 - 1.00 mg/dL 0.87  0.73  0.75   Sodium 135 - 145 mmol/L 142  138  141   Potassium 3.5 - 5.1 mmol/L 4.1  3.4  3.7   Chloride 98 - 111 mmol/L 106  105  109   CO2 22 - 32 mmol/L '29  28  26   '$ Calcium 8.9 - 10.3 mg/dL 9.7  9.1  9.2    7/2 K+ at 4.1 (6/27), trend with weekly labs 16. Anemia mild  -Continue to monitor    Latest Ref Rng & Units 05/13/2022    5:23 AM 05/03/2022    6:16 AM 04/30/2022   12:26 AM  CBC  WBC 4.0 - 10.5 K/uL 6.4  4.4  6.0   Hemoglobin 12.0 - 15.0 g/dL 11.0  11.4  11.9   Hematocrit 36.0 - 46.0 % 34.9  35.8  37.8   Platelets 150 - 400 K/uL 244  149  171    continue to follow with weekly labs    LOS: 20 days A FACE TO FACE EVALUATION WAS PERFORMED  Charlett Blake 05/20/2022, 7:34 AM

## 2022-05-20 NOTE — Progress Notes (Signed)
Recreational Therapy Session Note  Patient Details  Name: [name] MRN: [id number] Date of Birth: [birth year] Today's Date: 05/20/2022  Pain: c/o 10/10 pain with movement, rest breaks assisted with relief.  Pt also using k--pad.  Skilled Therapeutic Interventions/Progress Updates: Pt I bed with OT assisting pt with toileting upon arrival.  Pt unaware that she had already urinated and bed was soiled.  CTRS assisted pt in bed mobility.  Pt required mod assist +2 for rolling and to don new brief and change sheets.  Pt needed frequent rest break between rolls due to pain.  Pt provided with min cues for deep breathing to help with relaxation and pain management.  Pt sat EOB ~15 seconds with +2 assist for balance and linen change.  Pt required Total assist to transition from supine to sitting EOB.  Pt attempting to use her sense of humor to assist in pain management.  Due to change in pts status, no further TR at this time.  Will continue to monitor through team. Therapy/Group: Co-Treatment  Abdifatah Colquhoun 05/20/2022, 4:17 PM

## 2022-05-20 NOTE — Progress Notes (Signed)
Physical Therapy Session Note  Patient Details  Name: [name] MRN: [id number] Date of Birth: 09/26/1946  Today's Date: 05/20/2022 PT Individual Time: 1055-1206 PT Individual Time Calculation (min): 71 min   Short Term Goals: Week 3:  PT Short Term Goal 1 (Week 3): Pt will perform supine<>sit with mod assist of 1 PT Short Term Goal 2 (Week 3): Pt will tolerate sitting on EOB for at least 5 minutes for ADL tasks PT Short Term Goal 3 (Week 3): Pt will perform sit<>stand transfers with mod assist of 1 PT Short Term Goal 4 (Week 3): Pt will consistently participate in bed<>chair/BSC transfers with mod assist of 1 PT Short Term Goal 5 (Week 3): Pt will participate in gait training of 33f using LRAD with mod assist of 1  Skilled Therapeutic Interventions/Progress Updates:    Pt received supine in bed with her husband, BMikki Massey present and pt agreeable to therapy session but reporting "feeling out of it" but does clarify "not like before where I didn't know where I was." Pt also reports feeling "nauseous" - states she did eat her breakfast but hasn't had a BM today. Pt does ask that if she tries to get to the EOB does she have to stay there if she is expressing that it really hurts and asks to lay back down, and therapist provides her comfort to know that all treatment will be modified within her pain tolerance and that per nursing pt has received all available pain medication at this time. Anticipate pt is inquiring about this because with previous attempts to sit EOB, therapist has had her maintain that position for ~30seconds - 135mute while providing cuing for breathing and relaxation strategies to see if her pain could be lessened while in sitting but it has not been effective the past 2 days like it had been the week of 7/4. After this discussion, pt agreeable to attempt getting to EOB.    Discussed option of having +2 dependent assist to get to EOB to see if it was truly the anticipation of the  pain and her "tensing" her muscles that is causing the spike in pain. Pt agreeable to attempt a dependent assist transfer therefore performed +2 dependent assist supine>sitting R EOB; however, pt screaming out in pain, grimacing, and jerking/tensing up her whole body upon bringing her trunk upright; therefore, returned to supine with +2 dependent assist in less than 10 seconds. Pt becomes upset that she is not able to get to the EOB and says "it just hurts so bad" and then starts questioning herself stating she needs "psychological help."   Therapist provided emotional support and educated pt on her recent pain experience and all of the modifications that the team are taking to help with pain management as well as modifying interventions.  Performed rolling R/L in bed using bedrails to reposition bed linen underneath her for increased comfort with min assist (pt achieves very minimal roll) and pt still having grimacing onset of pain with these movements but definitely not as severe as when trying to sit up, and pt able to utilize breathing/relaxation strategies for pain management to continue participating.  Therapist discussed with patient and team about having Neuropsych consulted.  Pt reports need to use bathroom. Rolled as described above with +2 assist while therapist placed bed pan dependently. Pt left supine in bed on the bedpan with nurse notified and aware to assist pt - pt's husband aware also.  Therapy Documentation Precautions:  Precautions Precautions: Fall,  Other (comment) Precaution Comments: pt has personal L LE AFO Restrictions Weight Bearing Restrictions: No   Pain:  Details above - pt still experiencing severe pain upon attempts at performing supine>sit and remains unable to tolerate coming upright to sit on EOB. Nursing confirmed pt fully premedicated prior to therapist arrival. Therapist continues to provide breathing/relaxation cuing throughout as well as moving very slowly  and modifying the technique for pain management.    Therapy/Group: Individual Therapy  Tawana Scale , PT, DPT, NCS, CSRS 05/20/2022, 8:02 AM

## 2022-05-20 NOTE — Progress Notes (Signed)
ANTICOAGULATION CONSULT NOTE- follow-up  Pharmacy Consult for Warfarin Indication:  mechanical mitral valve  Allergies  Allergen Reactions   Zoloft [Sertraline] Hives, Itching and Rash    Patient Measurements: Height: '5\' 7"'$  (170.2 cm) Weight: 55.4 kg (122 lb 2.2 oz) IBW/kg (Calculated) : 61.6   Vital Signs: Temp: 97.7 F (36.5 C) (07/13 1334) Temp Source: Oral (07/13 0637) BP: 125/80 (07/13 1334) Pulse Rate: 104 (07/13 1334)  Labs: Recent Labs    05/20/22 0513  LABPROT 31.6*  INR 3.1*     Estimated Creatinine Clearance: 48.1 mL/min (by C-G formula based on SCr of 0.87 mg/dL).   Assessment: 76 YO female with medical history significant for mMVR in 1997 on warfarin PTA.  Patient presented to the ED with confusion and slurred speech found to have acute SAH. Lovenox / warfarin from PTA was then held on admission and resumed 6/22. Lovenox bridge completed. Pharmacy consulted to manage warfarin during rehab admission.   INR 3.1  Goal of Therapy:  INR 2.5-3.5 Monitor platelets by anticoagulation protocol: Yes   Plan:  Give warfarin 5 mg PO x1 today Warfarin 2.5 mg PO x1 on Sun Check INR M/Th while on warfarin Continue to monitor H&H and platelets    Thank you for involving pharmacy in this patient's care.  Vaughan Basta BS, PharmD, BCPS Clinical Pharmacist 05/20/2022 1:41 PM  Contact: [phone] after 3 PM  "Be curious, not judgmental..." -Jamal Maes

## 2022-05-20 NOTE — Plan of Care (Signed)
  Problem: RH PAIN MANAGEMENT Goal: RH STG PAIN MANAGED AT OR BELOW PT'S PAIN GOAL Description: At or below level 4 w prns Outcome: Not Progressing; pt have spasms when she turns in bed;  does not know if pain med helped; noted scheduled pain med was increased in dose

## 2022-05-20 NOTE — Progress Notes (Signed)
Recreational Therapy Discharge Summary Patient Details  Name: [name] MRN: [id number] Date of Birth: 29-Sep-1946 Today's Date: 05/20/2022  Comments on progress toward goals: Pt with limited participation in therapy sessions due to pain.  No further TR at this time.  Will continue to monitor through team.   Reasons for discharge: change in medical status  Patient/family agrees with progress made and goals achieved: Yes  [name] 05/20/2022, 4:23 PM

## 2022-05-20 NOTE — Progress Notes (Signed)
Occupational Therapy Session Note  Patient Details  Name: [name] MRN: [id number] Date of Birth: October 07, 1946  Today's Date: 05/20/2022 OT Individual Time: 0916-1000 session 1 OT Individual Time Calculation (min): 44 min  Missed 16 mins d/t pain  Session 2: 5102-5852   Short Term Goals: Week 3:  OT Short Term Goal 1 (Week 3): STG=LTG  Skilled Therapeutic Interventions/Progress Updates:  Session 1: Pt greeted supine in bed reporting increased pain and declining OOB mobility, pt does report need to void b/b but wanting to use bed pan. Pt required MOD A +2 to roll to R<>L to get on bed pan. Utilized deep breathing, visualization, and distraction as pain mgmt strategies to get on bed pan. Pt with + urine void needing totalA for hygiene and MOD A to roll to don brief. +2 assist to pull pt up in bed, education provided on importance of directing level of care and working on deep breathing during transitional movements. Pt did completed bed level grooming tasks from bed level with set- up assist with pt reporting " I feel like a new person."  Pt declined any further functional mobility or ADLs. Pt left semi reclined in bed with bed alarm activated and all needs within reach.                Session 2: Pt greeted supine in bed requesting to void bladder using female urinal. Total A to set- up urinal from bed level, pt noted to have already urinated with pt unaware. Pt required MOD A +2 to roll R<>L for pericare, to don new brief and change sheets as sheets were saturated. Pt needed + time between all rolling transitions. Pt was able to tolerate supine>sit with total A +1 to sit EOB ~ 15 secs while +2 fixed bed sheets before pt reported needing to lay back down. Emphasized guided deep breathing during rolling/ bed mobility as pt continues to report 10/10 pain during all mobility tasks. Pt continues to keep her sense of humor during a tough time where pt has been greatly limited by pain.   Pt left in  sidelying on R side with pillow between pts knees and heating pad on low back for pain mgmt. Bed alarm activated and all needs within reach.                   Therapy Documentation Precautions:  Precautions Precautions: Fall, Other (comment) Precaution Comments: pt has personal L LE AFO Restrictions Weight Bearing Restrictions: No  Pain: 10/10 in low back, heat, repositioning, distraction, deep breathing, relaxation all used as pain mgmt strategies.     Therapy/Group: Individual Therapy  Corinne Ports [facility] 05/20/2022, 12:09 PM

## 2022-05-21 ENCOUNTER — Inpatient Hospital Stay (HOSPITAL_COMMUNITY): Payer: Medicare Other

## 2022-05-21 DIAGNOSIS — M48061 Spinal stenosis, lumbar region without neurogenic claudication: Secondary | ICD-10-CM

## 2022-05-21 NOTE — Progress Notes (Signed)
Orthopedic Tech Progress Note Patient Details:  [name] 02-28-1946 [id number]  Ortho Devices Type of Ortho Device: Lumbar corsett Ortho Device/Splint Interventions: Ordered      [name] 05/21/2022, 3:08 PM

## 2022-05-21 NOTE — Consult Note (Signed)
Neuropsychological Consultation   Patient:   [name]   DOB:   March 05, 1946  MR Number:  [id number]  Location:  [facility] [address] [facility] [place] [id number] [place] [address] Dept: [place]: [phone]           Date of Service:   05/21/2022  Start Time:   9 AM End Time:   10 AM  Provider/Observer:  Ilean Skill, Psy.D.       Clinical Neuropsychologist       Billing Code/Service: [id number]/[id number]  Chief Complaint:    [name] is a 76 year old female who had previously been seen in the CIR in 2020 post-CVA with residual left hemiparesis and was seen a second time in 2021 due to subcortical CVA.  Patient presented on 04/26/2022 with acute onset of slurred speech as well as altered mental status.  Cranial CT scan showed acute subarachnoid hemorrhage underlying the left greater right perioccipital lobes as well as redemonstrated focus of chronic encephalomalacia within the right frontal parietal lobes.  Chronic infarcts within the left thalamus and bilateral cerebellar hemispheres were also noted.  Conservative care was elected due to subarachnoid hemorrhage in the left parietal cortex.  Patient was eventually referred to CIR due to decreased functional mobility and slurred speech.  Reason for Service:  Request for repeat neuropsychological consultation with the patient due to ongoing distress of her significant pain symptoms and difficulty with the level of pain being able to fully participate in therapeutic activities.  Below is the HPI for the current admission.  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 Alysia Penna in the outpatient office.  Per chart review patient lives with spouse.  Two-level home bed and bath main level one-step to entry.  Uses a quad cane in the home, generally modified independent with mobility.  Presented 04/26/2022 with acute onset of slurred speech as well as altered mental status.  Family denied any recent trauma or fall.  Patient does have a history of chronic hip pain was scheduled to have injection 04/29/2022 thus her Coumadin had been held since 04/23/2022 and she had been placed on Lovenox bridge injections 1 mg/kilogram twice daily administered by her husband..  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  Chronic infarcts within the left thalamus and bilateral cerebellar hemispheres.  CT angiogram of head and neck no hemodynamically significant stenosis occlusion dissection or aneurysm.  Admission chemistries unremarkable aside glucose 155 BUN 26, hemoglobin A1c 6.1, ammonia level within normal limits, urinalysis negative nitrite, urine drug screen negative.  Hospital course discussed with neurosurgery/Dr. Annette Stable with conservative care and repeat cranial CT scan 04/27/2022 again showing moderate amount of subarachnoid hemorrhage in left parietal cortex with slight interval increase.  Small amount of subarachnoid hemorrhage in the right parietal cortex not changed.  Plan on resuming Coumadin with Lovenox bridge 04/29/2022.  Patient tolerating a regular diet.  Therapy evaluations completed due to patient decreased functional mobility and slurred speech was admitted for a comprehensive rehab program.  Current  Status:  The patient was awake and alert almost fully reclined in her bed with slight head elevation.  Patient  acknowledged experienced significant pain since I saw her the first time and the patient has had medical work-up including CT lumbar spine and other work-ups to ensure that there is no other potential medical/neurological causes of her significant pain symptoms.  Thus far, no other potential etiological factors have been noted other than her recent cerebrovascular accident.  As noted above in her history, the patient's most recent cranial CT study showed acute subarachnoid hemorrhage underlying the left greater and right Perry occipital lobes as well as redemonstrating a focus of chronic encephalomalacia within the right frontal parietal lobes with chronic infarcts within the left thalamus and bilateral cerebellar hemispheres.  The combination of her most recent subarachnoid hemorrhage impacting parietal lobe functioning and chronic infarcts in the left thalamus are likely the most consistent causative factors related to her increase in significant pain symptoms.  As we have not been able to identify other causative factors my suspicion is is that impacts of her parietal and thalamic involvement and her cerebrovascular accidents and potential changes as blood products move in a potentially redistributed to some degree have played some involvement.  The patient is very reactive to any type of pain or even tactile stimulation.  During the clinical interview today her table was moved slightly with a general bump to her left knee and she responded with a significant dramatic reflexive response as if she had been injured significantly.  The patient is intellectually aware that some of these tactile stimulations are very minor in nature but she experiences significant pain and startle response.  As the patient continues to have these significant pain response to even simple tactile stimulation is clearly gone up create difficult adjustment issues with therapy.  As her subarachnoid hemorrhage continues to evolve the  symptoms are likely to change and has the patient reports there is been no worsening of her symptoms over the past couple of days hopefully that will change towards improvement.  Behavioral Observation: [name]  presents as a 76 y.o.-year-old Right handed Caucasian Female who appeared her stated age. her dress was Appropriate and she was Well Groomed and her manners were Appropriate to the situation.  her participation was indicative of Appropriate and Redirectable behaviors.  There were physical disabilities noted.  she displayed an appropriate level of cooperation and motivation.     Interactions:    Active Redirectable  Attention:   abnormal and attention span appeared shorter than expected for age  Memory:   abnormal; remote memory intact, recent memory impaired  Visuo-spatial:  abnormal visual neglect  Speech (Volume):  low  Speech:   normal; slurred  Thought Process:  Coherent and Relevant  Though Content:  WNL; not suicidal and not homicidal  Orientation:   person, place, and situation  Judgment:   Fair  Planning:   Poor  Affect:    Depressed  Mood:    Dysphoric  Insight:   Fair  Intelligence:   normal   Medical History:   Past Medical History:  Diagnosis Date   Abnormality of gait 09/07/2016   Allergy    Diabetes mellitus without complication ([facility])    Patient denies this - notes history of glucose intolerance   GERD (gastroesophageal reflux disease)    Hemiparesis and alteration of sensations as late effects of stroke ([facility]) 09/07/2016   History of pneumonia 1997   Hypertension  S/P MVR (mitral valve replacement)    Mechanical mitral valve replacement at age 38 (done in [place])  // echo 7/17: EF 55-60, normal wall motion, bileaflet mechanical mitral valve prosthesis functioning normally, mild LAE, mildly reduced RVSF, small pericardial effusion   Stroke ([facility]) 1997, 2013, 2015         Patient Active Problem List   Diagnosis Date Noted    Subarachnoid hemorrhage ([facility]) 04/30/2022   SAH (subarachnoid hemorrhage) ([facility]) 04/26/2022   Goals of care, counseling/discussion    Palliative care by specialist    Acute CVA (cerebrovascular accident) ([facility]) 07/01/2020   Subcortical infarction ([facility]) 09/05/2019   Anticoagulated on warfarin    Left hemiparesis ([facility])    History of CVA with residual deficit    Thrombocytopenia ([facility])    Prediabetes    Vertigo 09/24/2017   Benign paroxysmal positional vertigo    History of stroke    HLD (hyperlipidemia) 08/23/2017   Encounter for therapeutic drug monitoring 08/08/2017   Long term (current) use of anticoagulants [Z79.01] 07/12/2017   Hemiparesis and alteration of sensations as late effects of stroke ([facility]) 09/07/2016   Abnormality of gait 09/07/2016   HTN (hypertension) 04/13/2016   H/O mitral valve replacement with mechanical valve 10/16/2015   Diabetes mellitus without complication ([facility])    Hemiparesis (L sided - mild) due to old stroke [facility])         Psychiatric History:  On no prior psychiatric history is noted the patient is having coping issues with depressive symptomatology with new presentation of cerebrovascular accident.  Family Med/Psych History:  Family History  Problem Relation Age of Onset   Cancer Mother        Bone   Heart disease Mother    Hyperlipidemia Mother    Hypertension Mother    Stroke Father    Hypertension Father    Heart attack Neg Hx     Impression/DX:  [name] is a 76 year old female who had previously been seen in the CIR in 2020 post-CVA with residual left hemiparesis and was seen a second time in 2021 due to subcortical CVA.  Patient presented on 04/26/2022 with acute onset of slurred speech as well as altered mental status.  Cranial CT scan showed acute subarachnoid hemorrhage underlying the left greater right perioccipital lobes as well as redemonstrated focus of chronic encephalomalacia within the right frontal parietal lobes.  Chronic infarcts  within the left thalamus and bilateral cerebellar hemispheres were also noted.  Conservative care was elected due to subarachnoid hemorrhage in the left parietal cortex.  Patient was eventually referred to CIR due to decreased functional mobility and slurred speech.  The patient was awake and alert almost fully reclined in her bed with slight head elevation.  Patient acknowledged experienced significant pain since I saw her the first time and the patient has had medical work-up including CT lumbar spine and other work-ups to ensure that there is no other potential medical/neurological causes of her significant pain symptoms.  Thus far, no other potential etiological factors have been noted other than her recent cerebrovascular accident.  As noted above in her history, the patient's most recent cranial CT study showed acute subarachnoid hemorrhage underlying the left greater and right Perry occipital lobes as well as redemonstrating a focus of chronic encephalomalacia within the right frontal parietal lobes with chronic infarcts within the left thalamus and bilateral cerebellar hemispheres.  The combination of her most recent subarachnoid hemorrhage impacting parietal lobe functioning and chronic infarcts in the  left thalamus are likely the most consistent causative factors related to her increase in significant pain symptoms.  As we have not been able to identify other causative factors my suspicion is is that impacts of her parietal and thalamic involvement and her cerebrovascular accidents and potential changes as blood products move in a potentially redistributed to some degree have played some involvement.  The patient is very reactive to any type of pain or even tactile stimulation.  During the clinical interview today her table was moved slightly with a general bump to her left knee and she responded with a significant dramatic reflexive response as if she had been injured significantly.  The patient is  intellectually aware that some of these tactile stimulations are very minor in nature but she experiences significant pain and startle response.  As the patient continues to have these significant pain response to even simple tactile stimulation is clearly gone up create difficult adjustment issues with therapy.  As her subarachnoid hemorrhage continues to evolve the symptoms are likely to change and has the patient reports there is been no worsening of h  Disposition/Plan:  I do think that the most likely causative factors for her pain are directly related to both previous and most recent cerebrovascular events.  The neurological symptoms and magnification of pain and pain sensitivity are likely linked to her previous thalamic and most recent parietal lobe involvement.  I suspect that there is little direct interventions that would be helpful beyond further improvement in evolution of her blood product from her cerebrovascular accident.  I will follow-up with the patient first of next week.  Diagnosis:    SAH (subarachnoid hemorrhage) ([facility]) - Plan: Ambulatory referral to Neurology, Ambulatory referral to Physical Medicine Rehab         Electronically Signed   _______________________ Ilean Skill, Psy.D. Clinical Neuropsychologist

## 2022-05-21 NOTE — Progress Notes (Signed)
Occupational Therapy Session Note  Patient Details  Name: [name] MRN: [id number] Date of Birth: Dec 21, 1945  Today's Date: 05/21/2022 OT Individual Time: 0240-9735 & 13:05 - 14:08  OT Individual Time Calculation (min): 47 min & 63 min   Short Term Goals: Week 2:  OT Short Term Goal 1 (Week 2): Patient will transition from sit to stand with mod assist to prepare for toilet transfer OT Short Term Goal 1 - Progress (Week 2): Met OT Short Term Goal 2 (Week 2): Patient will don pants over feet with no more than min assist OT Short Term Goal 2 - Progress (Week 2): Progressing toward goal OT Short Term Goal 3 (Week 2): Patient will pull pants over hips with no more than mod assists OT Short Term Goal 3 - Progress (Week 2): Progressing toward goal OT Short Term Goal 4 (Week 2): Patient will maintain standing with BUE in support for 10 seconds with mod assist in preparation for LB ADL OT Short Term Goal 4 - Progress (Week 2): Met  First Session -- Skilled Therapeutic Interventions/Progress Updates:  Pt awake in bed upon OT arrival to the room. Pt reports, "I had a rought night last night and I'm a little groggy." Pt in agreement for OT session.   Therapy Documentation Precautions:  Precautions Precautions: Fall, Other (comment) Precaution Comments: pt has personal L LE AFO Restrictions Weight Bearing Restrictions: No Vital Signs:  Please see "Flowsheet" for most recent vitals charted by nursing staff. Pain: Pain Assessment Pain Scale: 0-10 Pain Score: 2  Pain Type: Acute pain Pain Location: Back Pain Orientation: Lower Pain Descriptors / Indicators: Aching Pain Onset: On-going Pain Intervention(s): Distraction;Emotional support (Pt pre-medicated)  ADL: Grooming: Supervision/safety ([name] oral care and facial hygiene at bed level with supervision with minimal prompts to initiate tasks.) Where Assessed-Grooming: Bed level Upper Body Bathing: Supervision/safety,  Minimal cueing (Pt able to bathe all UB parts with supervision and minimal prompts for thoroughness during a sponge bath at bed level.) Where Assessed-Upper Body Bathing: Bed level Upper Body Dressing: Maximal assistance, Moderate cueing (Pt requires maximal assistance and moderate VCs to don a pull-over style shirt at bed level.) Where Assessed-Upper Body Dressing: Bed level Lower Body Dressing: Dependent (Pt requires 2-person assist to don pants at bed level in order to decrease pain with rolling.) Where Assessed-Lower Body Dressing: Bed level Toileting: Dependent (Pt unaware of bladder incontinence in brief and requires 2-person assist to decrease pain levels with rolling at bed level.) Where Assessed-Toileting: Bed level ADL Comments: Pt requires moderate encouraged to participate in ADLs at bed level due to fatigue and pain levels. All ADLs performed at bed level an dunable to progress mobility due to significant pain levels. Pt demo's increased pain with slight movements in bed with scooting and rolling. Pt demo's decreased sequencing and initiation with ADLs today potentially due to fatigue and pain levels.  Pain Management Education: Pt reports and demo's significant pain with mobility at bed level which impacts pt's independence levels and ability to progress past bed level with functional mobility tasks today. OT provides education on techniques such as pursed-lip breathing, visualization, using music, etc to assist with managing pain and/or not anticipating significant pain levels prior to mobility due to pt presenting with some s/s of anxiousness with movement throughout session. OT turned on classical music when adjusting HOB to elevate to ~30 degrees, however, no decrease in pain signs were noted. Encouraged pt to trial various techniques to determine if  any relaxation techniques assist with pain management.    Pt remained in bed at end of session. Pt left resting comfortably in bed with  personal belongings and call light within reach, bed alarm on and activated, bed in low position, 3 bed rails up, visitors in room, and comfort needs attended to.    Second Session -- Skilled Therapeutic Interventions/Progress Updates:  Pt awake in bed upon OT arrival to the room. Pt reports, "I'm sorry that I'm so grumpy." OT provide empathetic discussion and therapeutic listening to provide support. Pt in agreement for OT session.  Therapy Documentation Precautions:  Precautions Precautions: Fall, Other (comment) Precaution Comments: pt has personal L LE AFO Restrictions Weight Bearing Restrictions: No Vital Signs:  Please see "Flowsheet" for most recent vitals charted by nursing staff. Pain:  05/21/22 1305  Pain Assessment  Pain Scale 0-10  Pain Score 3  Pain Type Acute pain  Pain Location Back  Pain Orientation Mid  Pain Descriptors / Indicators Aching  Pain Intervention(s) Distraction;Emotional support   ADLs: Pt participates in LB dressing due to soiling previous pants. Pt requires 2-person assist to don pants at bed level and standing to pull up in order to decrease pain levels.   Functional Mobility Pt participates in functional mobility tasks with increased time due to increased pain levels. Pt able to complete supine > sit at EOB with exit on R side with 2-person assist for pt's comfort with pain. Pt able to complete 2-3 sit <> stand transfers with maximal assistance x 1 & CGA of another in order to pull pants over buttocks. Education provided to pt on proper transfer techniques such as scooting to the edge, importance of head-hip relationship ("nose over toes"), and pushing up from the stable surface. Pt then able to complete a SPT to the R with 2 - person assist (maximal assistance x 1 & CGA of another for pt's comfort. Pt given a K-Pad on lower back while sitting in the recliner to assist with pain management.   Brace Management: Education provided to pt on LSO brace  management. Educated pt and spouse that brace is for comfort only and that pt does not have any specific wearing schedules. Education and demo provided to pt and spouse on techniques to don brace, re-set straps, and no specific wearing schedule. Pt and spouse verbalize understanding.   Pt returned to bed at end of session. Pt left resting comfortably in bed with personal belongings and call light within reach, bed alarm on and activated, bed in low position, 3 bed rails up, and comfort needs attended to.    Therapy/Group: Individual Therapy  Barbee Shropshire 05/21/2022, 12:45 PM

## 2022-05-21 NOTE — Progress Notes (Signed)
PROGRESS NOTE   Subjective/Complaints:  CT lumbar showing no acute abnormalities see below    ROS: Pt denies SOB, abd pain, CP, N/V/C/D, and vision changes  Objective:   CT LUMBAR SPINE WO CONTRAST  Result Date: 05/21/2022 CLINICAL DATA:  76 year old female with persistent low back pain. EXAM: CT LUMBAR SPINE WITHOUT CONTRAST TECHNIQUE: Multidetector CT imaging of the lumbar spine was performed without intravenous contrast administration. Multiplanar CT image reconstructions were also generated. RADIATION DOSE REDUCTION: This exam was performed according to the departmental dose-optimization program which includes automated exposure control, adjustment of the mA and/or kV according to patient size and/or use of iterative reconstruction technique. COMPARISON:  Lumbar MRI 03/24/2022. CT Abdomen and Pelvis 03/25/2016. FINDINGS: Segmentation: Normal, the same numbering system used on the May MRI. Alignment: Moderate levoconvex lumbar scoliosis with apex at L3. Stable straightening of lumbar lordosis. Vertebrae: No acute osseous abnormality identified. Advanced degenerative endplate changes on the right side at L3-L4. Similar left lateral degenerative endplate changes at Y6-T0. Left L5 posterior element degeneration, no fracture corresponding to the area of marrow edema in May. Visible sacrum and SI joints appear intact. Paraspinal and other soft tissues: Chronic simple fluid density right hepatic cyst is partially visible but has increased since 2017, now at least 5 cm diameter, previously 2.8 cm (no follow-up imaging recommended). bilateral nephrolithiasis. Left extrarenal pelvis. No evidence of acute obstructive uropathy. Calcified aortic atherosclerosis. Lumbar paraspinal soft tissues appears stable since May. Disc levels: Lumbar spine degeneration appears stable to the lumbar MRI in May, see details of that exam. IMPRESSION: 1. No acute osseous  abnormality in the lumbar spine. 2. Moderate levoconvex lumbar scoliosis with lumbar spine degeneration stable compared to MRI in May, see details on that exam. 3. Bilateral nephrolithiasis. Aortic Atherosclerosis (ICD10-I70.0). Electronically Signed   By: Genevie Ann M.D.   On: 05/21/2022 06:30   No results for input(s): "WBC", "HGB", "HCT", "PLT" in the last 72 hours.   No results for input(s): "NA", "K", "CL", "CO2", "GLUCOSE", "BUN", "CREATININE", "CALCIUM" in the last 72 hours.   Intake/Output Summary (Last 24 hours) at 05/21/2022 0725 Last data filed at 05/20/2022 1823 Gross per 24 hour  Intake 240 ml  Output --  Net 240 ml         Physical Exam: Vital Signs Blood pressure 114/72, pulse 98, temperature 98.4 F (36.9 C), temperature source Oral, resp. rate 18, height '5\' 7"'$  (1.702 m), weight 55.4 kg, SpO2 94 %.   General: No acute distress Mood and affect are appropriate Heart: Regular rate and rhythm no rubs murmurs or extra sounds Lungs: Clear to auscultation, breathing unlabored, no rales or wheezes Abdomen: Positive bowel sounds, soft nontender to palpation, nondistended Extremities: No clubbing, cyanosis, or edema Skin: No evidence of breakdown, no evidence of rash   Neuro: CN II-XII intact, motor strength 4/5 bilateral deltoid, bicep, triceps, grip, hip flexor, knee extensors, ankle dorsiflexors/plantar flexors.  Sensory exam normal sensation to light touch lower extremities.  Speech with minimal dysarthria, sentence level, occasional word finding, but appears at baseline. Musculoskeletal: ,Back spasm below waist when extending LLE  Sensation intact LT Bilateral L3-4-5 S1 dermatomal distribution   Assessment/Plan: 1.  Functional deficits which require 3+ hours per day of interdisciplinary therapy in a comprehensive inpatient rehab setting. Physiatrist is providing close team supervision and 24 hour management of active medical problems listed below. Physiatrist and rehab  team continue to assess barriers to discharge/monitor patient progress toward functional and medical goals  Care Tool:  Bathing    Body parts bathed by patient: Front perineal area, Abdomen, Chest, Left arm, Right arm, Face, Left upper leg, Right upper leg   Body parts bathed by helper: Buttocks     Bathing assist Assist Level: Moderate Assistance - Patient 50 - 74%     Upper Body Dressing/Undressing Upper body dressing   What is the patient wearing?: Pull over shirt    Upper body assist Assist Level: Moderate Assistance - Patient 50 - 74%    Lower Body Dressing/Undressing Lower body dressing      What is the patient wearing?: Incontinence brief, Pants     Lower body assist Assist for lower body dressing: Maximal Assistance - Patient 25 - 49%     Toileting Toileting Toileting Activity did not occur (Clothing management and hygiene only): N/A (no void or bm)  Toileting assist Assist for toileting: 2 Helpers (MOD A +2)     Transfers Chair/bed transfer  Transfers assist     Chair/bed transfer assist level: Moderate Assistance - Patient 50 - 74% (face to face stand pivot) Chair/bed transfer assistive device: Programmer, multimedia   Ambulation assist      Assist level: Contact Guard/Touching assist Assistive device: Cane-quad Max distance: 289f   Walk 10 feet activity   Assist     Assist level: Contact Guard/Touching assist Assistive device: Cane-quad   Walk 50 feet activity   Assist    Assist level: Contact Guard/Touching assist Assistive device: Cane-quad    Walk 150 feet activity   Assist Walk 150 feet activity did not occur: Safety/medical concerns         Walk 10 feet on uneven surface  activity   Assist     Assist level: Contact Guard/Touching assist Assistive device: Cane-quad   Wheelchair     Assist Is the patient using a wheelchair?: No (only for time management due to slow gait speed)              Wheelchair 50 feet with 2 turns activity    Assist            Wheelchair 150 feet activity     Assist          Blood pressure 114/72, pulse 98, temperature 98.4 F (36.9 C), temperature source Oral, resp. rate 18, height '5\' 7"'$  (1.702 m), weight 55.4 kg, SpO2 94 %.  Medical Problem List and Plan: 1. 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  Antithrombotics: -DVT/anticoagulation: Coumadin resumed 04/29/2022 with bridging of Lovenox.  INR goal 2.5-3.5, INR still low , INR 2.5   3. Pain: lumbar scoliosis and stenosis , myofascial component ,   Lumbar xray no new changes vs MRI 5/23, although a small vertebral compression fracture may cause  exacerbation of sx,  no MRI ordered since pt would need to be sedated with IV and no change in treatment would be necessary  CT scan reviewed looks like MRI from 03/2022.  Could still have end plate stress rxn but no change in treatment needed. May trial lumbar corset for comfort Now on Nucynta  '75mg'$  QID, has only received 2 doses but may increase to '100mg'$  if  ineffective  4. Mood/Sleep: Provide emotional support             -antipsychotic agents: N/A 5. Neuropsych/cognition: This patient is capable of making decisions on her own behalf. 6. Skin/Wound Care: Routine skin checks 7. Fluids/Electrolytes/Nutrition: Routine in and outs with follow-up chemistries 8.  History of mitral valve replacement 1997.  Resuming Coumadin with bridging of Lovenox 04/29/2022.  Follow-up per cardiology services as needed -7/6 INR above goal, coumadin held today, monitor for bleeding 7/7 pharmacy adjusting coumadin dose 9.  Hyperlipidemia.  Continue Lipitor 10.  Hypertension.  Continue Norvasc 5 mg  daily Vitals:   05/20/22 1906 05/21/22 0327  BP: 101/69 114/72  Pulse: (!) 109 98  Resp: 18 18  Temp: 97.9 F (36.6 C) 98.4 F (36.9 C)  SpO2: 95% 94%   7/12 controlled  11.  Prediabetes.  Hemoglobin A1c 6.1.  Diet controlled/SSI discontinued 12.  History of lumbar spinal stenosis  with increased radicular pain , would not rec reschedule  ESI due to anticoag issues -would not push distances for ambulation  13.  GERD.  Protonix 14. Insomnia She plans to find out what medication she takes at home for consideration of use at the hospital 15. Hypokalemia  -KCL 51mq x2 , recheck     Latest Ref Rng & Units 05/04/2022    6:10 AM 05/03/2022    6:16 AM 04/30/2022   12:26 AM  BMP  Glucose 70 - 99 mg/dL 122  118  135   BUN 8 - 23 mg/dL '20  19  20   '$ Creatinine 0.44 - 1.00 mg/dL 0.87  0.73  0.75   Sodium 135 - 145 mmol/L 142  138  141   Potassium 3.5 - 5.1 mmol/L 4.1  3.4  3.7   Chloride 98 - 111 mmol/L 106  105  109   CO2 22 - 32 mmol/L '29  28  26   '$ Calcium 8.9 - 10.3 mg/dL 9.7  9.1  9.2    7/2 K+ at 4.1 (6/27), trend with weekly labs 16. Anemia mild  -Continue to monitor    Latest Ref Rng & Units 05/13/2022    5:23 AM 05/03/2022    6:16 AM 04/30/2022   12:26 AM  CBC  WBC 4.0 - 10.5 K/uL 6.4  4.4  6.0   Hemoglobin 12.0 - 15.0 g/dL 11.0  11.4  11.9   Hematocrit 36.0 - 46.0 % 34.9  35.8  37.8   Platelets 150 - 400 K/uL 244  149  171    continue to follow with weekly labs    LOS: 21 days A FACE TO FACE EVALUATION WAS PERFORMED  ACharlett Blake7/14/2023, 7:25 AM

## 2022-05-21 NOTE — Plan of Care (Signed)
  Problem: Consults Goal: RH STROKE PATIENT EDUCATION Description: See Patient Education module for education specifics  Outcome: Progressing   Problem: RH BOWEL ELIMINATION Goal: RH STG MANAGE BOWEL WITH ASSISTANCE Description: STG Manage Bowel with mod I Assistance. Outcome: Progressing Goal: RH STG MANAGE BOWEL W/MEDICATION W/ASSISTANCE Description: STG Manage Bowel with Medication with mod I Assistance. Outcome: Progressing   Problem: RH BLADDER ELIMINATION Goal: RH STG MANAGE BLADDER WITH ASSISTANCE Description: STG Manage Bladder With toileting Assistance Outcome: Progressing   Problem: RH SAFETY Goal: RH STG ADHERE TO SAFETY PRECAUTIONS W/ASSISTANCE/DEVICE Description: STG Adhere to Safety Precautions With cues Assistance/Device. Outcome: Progressing   Problem: RH PAIN MANAGEMENT Goal: RH STG PAIN MANAGED AT OR BELOW PT'S PAIN GOAL Description: At or below level 4 w prns Outcome: Progressing   Problem: RH KNOWLEDGE DEFICIT Goal: RH STG INCREASE KNOWLEDGE OF HYPERTENSION Description: Patient and spouse will be able to manage HTN with medications and dietary modifications using handouts and educational resources independently Outcome: Progressing Goal: RH STG INCREASE KNOWLEGDE OF HYPERLIPIDEMIA Description: Patient and spouse will be able to manage HLD with medications and dietary modifications using handouts and educational resources independently Outcome: Progressing Goal: RH STG INCREASE KNOWLEDGE OF STROKE PROPHYLAXIS Description: Patient and spouse will be able to manage secondary risks with medications and dietary modifications using handouts and educational resources independently Outcome: Progressing

## 2022-05-21 NOTE — Plan of Care (Signed)
  Problem: RH Memory Goal: LTG Patient will use memory compensatory aids to (SLP) Description: LTG:  Patient will use memory compensatory aids to recall biographical/new, daily complex information with cues (SLP) Flowsheets (Taken 05/21/2022 1631) LTG: Patient will use memory compensatory aids to (SLP): Moderate Assistance - Patient 50 - 74% Note: Goal downgraded due to slow progress   Problem: RH Problem Solving Goal: LTG Patient will demonstrate problem solving for (SLP) Description: LTG:  Patient will demonstrate problem solving for basic/complex daily situations with cues  (SLP) Flowsheets (Taken 05/21/2022 1631) LTG Patient will demonstrate problem solving for: Moderate Assistance - Patient 50 - 74% Note: Goal downgraded due to slow progress   Problem: RH Attention Goal: LTG Patient will demonstrate this level of attention during functional activites (SLP) Description: LTG:  Patient will will demonstrate this level of attention during functional activites (SLP) Flowsheets (Taken 05/21/2022 1632) Patient will demonstrate during cognitive/linguistic activities the attention type of: Sustained LTG: Patient will demonstrate this level of attention during cognitive/linguistic activities with assistance of (SLP): Moderate Assistance - Patient 50 - 74%

## 2022-05-21 NOTE — Progress Notes (Signed)
Speech Language Pathology Weekly Progress and Session Note  Patient Details  Name: [name] MRN: [id number] Date of Birth: 1946/07/04  Beginning of progress report period: May 14, 2022 End of progress report period: May 21, 2022  Today's Date: 05/21/2022 SLP Individual Time: 1451-1545 SLP Individual Time Calculation (min): 54 min  Short Term Goals: Week 3: SLP Short Term Goal 1 (Week 3): STG=LTG due to ELOS SLP Short Term Goal 1 - Progress (Week 3): Progressing toward goal  New Short Term Goals: Week 4: SLP Short Term Goal 1 (Week 4): STG=LTG due to ELOS  Weekly Progress Updates: Patient continues to progress slowly toward long-term cognitive-linguistic goals in the areas of problem solving, memory, attention, and high level word finding. Pt continues to be limited by pain, however this appears to be improving. MD has made adjustments to medications which appear to have improved cognition and overall mental clarity. Although performance appears to fluctuate day-to-day, pt is currently completing basic level cognitive tasks with min-to-mod A for problem solving and at least mod, at times max A, for recall. Pt continues to exhibit minimal-to-no word finding difficulty at the conversation level. LTGs have been modified to reflect anticipated outcomes at discharge based on slow progress.  Patient and family education is ongoing. Patient would benefit from continued skilled SLP intervention to maximize cognitive-linguistic functioning and overall functional independence prior to discharge.     Intensity: Minumum of 1-2 x/day, 30 to 90 minutes Frequency: 3 to 5 out of 7 days Duration/Length of Stay: 7/20 Treatment/Interventions: Cognitive remediation/compensation;Cueing hierarchy;Patient/family education;Functional tasks;Speech/Language facilitation;Internal/external aids;Environmental controls  Daily Session Skilled Therapeutic Interventions: Skilled ST treatment focused on  cognitive-linguistic goals. Pt was received upright in recliner in therapy gym having just completed PT session. Pt and spouse were delighted that pt was able to get out of bed and stand today.   SLP facilitated alternating attention task with max fading to mod A verbal/visual cues for attention, organization, and working memory. Pt exhibited signs of cognitive fatigue half way into each cognitive task and benefited from intermittent breaks (2-3 minutes) throughout session. Pt demonstrated improved carry over of skill between tasks. SLP provided pt/spouse with strategies to maximize attention and memory including intermittent breaks, minimizing environmental distractions, being intentional about seeking clarification, and coming up with a "game plan" to increase focus.   Pt identified pertinent information on weekly weather forecast and completed mildly complex problem solving with min A verbal cues to achieve 100% accuracy. Pt wrote responses on paper with 100% spelling accuracy and average legibility.   Patient without evidence of word-finding deficits throughout session.  Patient was returned to room and left in recliner with alarm activated and immediate needs within reach at end of session. Continue per current plan of care.       General    Pain Pain Assessment Pain Scale: 0-10 Pain Score: 2  Pain Type: Acute pain Pain Location: Back Pain Orientation: Posterior Pain Descriptors / Indicators: Sore Pain Frequency: Constant Pain Onset: On-going Patients Stated Pain Goal: 0 Pain Intervention(s): Medication (See eMAR) Multiple Pain Sites: No  Therapy/Group: Individual Therapy  Patty Sermons 05/21/2022, 4:29 PM

## 2022-05-21 NOTE — Progress Notes (Signed)
Physical Therapy Session Note  Patient Details  Name: [name] MRN: [id number] Date of Birth: 1946-09-19  Today's Date: 05/21/2022 PT Individual Time: 1420-1450 PT Individual Time Calculation (min): 30 min   Short Term Goals: Week 3:  PT Short Term Goal 1 (Week 3): Pt will perform supine<>sit with mod assist of 1 PT Short Term Goal 2 (Week 3): Pt will tolerate sitting on EOB for at least 5 minutes for ADL tasks PT Short Term Goal 3 (Week 3): Pt will perform sit<>stand transfers with mod assist of 1 PT Short Term Goal 4 (Week 3): Pt will consistently participate in bed<>chair/BSC transfers with mod assist of 1 PT Short Term Goal 5 (Week 3): Pt will participate in gait training of 75f using LRAD with mod assist of 1  Skilled Therapeutic Interventions/Progress Updates:   Pt up in recliner and agreeable to out of room. Husband came along to observe session as well. Focused on OOB tolerance and sit <> stand re-training. Using calming voice and focusing on use of breath during transitional movements, able to bring legs down and lean forward in the chair without increase in pain. [name], c/o pain or yell out during any of the transitions or mobility, just maintained guarded postures and had more difficulty with motor planning requiring mod to max cues for sequencing and technique during sit <> stands. Extra time needed for all tasks as well from processing standpoint as well as due to guarding. [name] 2 reps of sit <> stand with mod to max assist with facilitation for anterior weightshift, cues for hand placement, and blocking of L foot placement (did not don AFO for this session). Initial sit > stand unable to maintain standing for more than about 20 seconds before requiring seated break. Second stand stool for about 1 min with mod to max assist with strong posterior lean and cues and facilitation for anterior weightshift. Handoff to SLP.  Therapy Documentation Precautions:   Precautions Precautions: Fall, Other (comment) Precaution Comments: pt has personal L LE AFO Restrictions Weight Bearing Restrictions: No  Pain: Pain Assessment Pain Scale: 0-10 Pain Score: 4  Pain Type: Acute pain Pain Location: Back Pain Orientation: Posterior Pain Descriptors / Indicators: Sore Pain Frequency: Constant Pain Onset: On-going Patients Stated Pain Goal: 0 Pain Intervention(s): Medication (See eMAR) Multiple Pain Sites: No     Therapy/Group: Individual Therapy  GCanary BrimBIvory Broad PT, DPT, CBIS  05/21/2022, 1:40 PM

## 2022-05-22 NOTE — Progress Notes (Signed)
Occupational Therapy Session Note  Patient Details  Name: [name] MRN: [id number] Date of Birth: September 16, 1946  Today's Date: 05/22/2022 OT Individual Time: 0388-8280 OT Individual Time Calculation (min): 45 min    Short Term Goals: Week 2:  OT Short Term Goal 1 (Week 2): Patient will transition from sit to stand with mod assist to prepare for toilet transfer OT Short Term Goal 1 - Progress (Week 2): Met OT Short Term Goal 2 (Week 2): Patient will don pants over feet with no more than min assist OT Short Term Goal 2 - Progress (Week 2): Progressing toward goal OT Short Term Goal 3 (Week 2): Patient will pull pants over hips with no more than mod assists OT Short Term Goal 3 - Progress (Week 2): Progressing toward goal OT Short Term Goal 4 (Week 2): Patient will maintain standing with BUE in support for 10 seconds with mod assist in preparation for LB ADL OT Short Term Goal 4 - Progress (Week 2): Met  Skilled Therapeutic Interventions/Progress Updates:    Upon OT arrival, pt semi recumbent in bed and nurse tech present. Pt reports the need to use the bathroom and was agreeable to OT treatment session. Pt reports no pain as she was recently provided with pain medication. Pt completes supine to sit transfer with Min A regressing to Mod A secondary to pain with movement. Pt requires occasional rest breaks laying back in bed before progressing to [facility]. Pt completes toilet transfer and toileting at the levels below. Pt was returned to supine with Mod A and completes UB/LB dressing at the levels listed below. Pt performs rolling in bed with Mod-Max A. Pt limited primarily by pain. Pt continues to benefit from OT services to achieve highest level of independence. Pt left in bed at end of session with all needs met. Spouse present at bedside.  Therapy Documentation Precautions:  Precautions Precautions: Fall, Other (comment) Precaution Comments: pt has personal L LE AFO Restrictions Weight  Bearing Restrictions: No  ADL: Upper Body Dressing: Moderate assistance Where Assessed-Upper Body Dressing: Bed level Lower Body Dressing: Maximal assistance Where Assessed-Lower Body Dressing: Bed level Toileting: Maximal assistance Where Assessed-Toileting: Bedside Commode Toilet Transfer: Maximal assistance Toilet Transfer Method: Squat pivot Toilet Transfer Equipment: Bedside commode   Therapy/Group: Individual Therapy  Kord Monette 05/22/2022, 11:11 AM

## 2022-05-22 NOTE — Progress Notes (Signed)
Pt c/o of nausea and is vomiting undigested food. Moderate amount. PRN ondansetron given.  Nausea and vomiting resolved with medication.

## 2022-05-22 NOTE — Progress Notes (Signed)
Physical Therapy Session Note  Patient Details  Name: [name] MRN: [id number] Date of Birth: [birth year]  Today's Date: 05/22/2022 PT Individual Time: 5188-4166 PT Individual Time Calculation (min): 60 min   Short Term Goals: Week 3:  PT Short Term Goal 1 (Week 3): Pt will perform supine<>sit with mod assist of 1 PT Short Term Goal 2 (Week 3): Pt will tolerate sitting on EOB for at least 5 minutes for ADL tasks PT Short Term Goal 3 (Week 3): Pt will perform sit<>stand transfers with mod assist of 1 PT Short Term Goal 4 (Week 3): Pt will consistently participate in bed<>chair/BSC transfers with mod assist of 1 PT Short Term Goal 5 (Week 3): Pt will participate in gait training of 54f using LRAD with mod assist of 1  Skilled Therapeutic Interventions/Progress Updates:    Pt received supine in bed with her husband, BMikki Santee present and pt agreeable to therapy session. Pt perseverating on having increased pain during morning OT session. Therapist provided emotional support, redirection, and reinforced education from MD and Neuropsychologist notes showing that pt's CT scan showed "no acute abnormalities" and that the parietal and thalamic involvement from her CVA could be contributing to a heightened pain response. Therapist utilized this information to educate pt on her brains ability to relearn her pain experience to allow a more positive participation in mobility tasks knowing she is not causing harm to her body. Pt continues to perseverate on having increased pain during OT session, but with persistent gentle, redirection to reinforce the fact that she is not hurting her body when moving, despite their being a pain experience and discomfort, this allows pt improved participation in therapy session with a more positive experience.  Confirmed with nursing that pt is premedicated.   Transitioned from supine>sitting R EOB, HOB elevated ~20degrees, and using R UE support on bedrail with max assist  for B LE management off EOB and trunk upright (did not perform logroll technique because concerned that pt now associates rolling with increased pain). Pt able to transition to sitting EOB with verbalizing increased pain but no screaming nor severe grimacing. She does tend to have mild R lateral trunk lean resting partially on R forearm support when sitting. Therapist continues to provide breathing/relaxation strategies for pain management. Pt requires max assist progressed to supervision for sitting balance as pain subsides and pt relaxes.  Therapist donned LSO for pt comfort with OOB mobility.   Pt reports need to use bathroom. Sit>stand EOB>B HHA on therapist's shoulders to promote increased anterior trunk lean with heavy max assist for lifting to standing due to pt continuing to have strong posterior lean. L stand pivot EOB>BSC with max assist for balance due to strong posterior lean and pt taking small steps with a wide BOS - standing with mod assist while +2 provided dependent LB clothing management and peri-care (continent of bladder).  L stand pivot transfer BSC>w/c (slightly further away) with heavy max assist of 1 for balance and +2 providing close supervision for safety due to pt demonstrating even strong posterior lean with heavy R lateral lean bias with poor midline awareness and limited to no correction of balance with cuing.    Transported to/from gym in w/c for time management and energy conservation for a change of scenery to improve pt's mood.   Therapist providing pt with positive reinforcement and encouragement throughout session on her success with mobility today.  Sit>stand w/c>B HHA on therapist's shoulders with heavy max assist due to continued  strong posterior lean and now with a L posterior lean bias.   Transported back to room and performed L stand pivot w/c>recliner to allow pt to sit upright with continued heavy max assist as described above. Pt left seated in recliner with  needs in reach, her husband present, K-pad heating pad on her back, and nurse aware of pt's position.   Therapy Documentation Precautions:  Precautions Precautions: Fall, Other (comment) Precaution Comments: pt has personal L LE AFO Restrictions Weight Bearing Restrictions: No   Pain:  Continues to report increased pain primarily during supine>sitting; however, pt does not scream out in pain today and is actually able to progress to OOB mobility to the [facility] and recliner. Pt premedicated and therapist providing redirection, positive reinforcement, breathing/relaxation strategies, and education for pain management.    Therapy/Group: Individual Therapy  Tawana Scale , PT, DPT, NCS, CSRS 05/22/2022, 8:03 AM

## 2022-05-23 DIAGNOSIS — K5900 Constipation, unspecified: Secondary | ICD-10-CM

## 2022-05-23 MED ORDER — SORBITOL 70 % SOLN
30.0000 mL | Freq: Once | Status: AC
Start: 2022-05-23 — End: 2022-05-23
  Administered 2022-05-23: 30 mL via ORAL
  Filled 2022-05-23: qty 30

## 2022-05-23 NOTE — Progress Notes (Signed)
PROGRESS NOTE   Subjective/Complaints:  Reports nausea last night, this has resolved. Reports she feels constipated.  LBM 7/14  ROS: Pt denies SOB, abd pain, CP, and vision changes + constipation  Objective:   No results found. No results for input(s): "WBC", "HGB", "HCT", "PLT" in the last 72 hours.   No results for input(s): "NA", "K", "CL", "CO2", "GLUCOSE", "BUN", "CREATININE", "CALCIUM" in the last 72 hours.   Intake/Output Summary (Last 24 hours) at 05/23/2022 1846 Last data filed at 05/23/2022 1256 Gross per 24 hour  Intake 960 ml  Output --  Net 960 ml         Physical Exam: Vital Signs Blood pressure 120/70, pulse 95, temperature 97.8 F (36.6 C), resp. rate 15, height '5\' 7"'$  (1.702 m), weight 55 kg, SpO2 94 %.   General: No acute distress, sitting in bed Mood and affect are appropriate Heart: Regular rate and rhythm no rubs murmurs or extra sounds Lungs: Clear to auscultation, breathing unlabored, no rales or wheezes, good air movement Abdomen: Positive bowel sounds, soft nontender to palpation, nondistended Extremities: No clubbing, cyanosis, or edema Skin: No evidence of breakdown, no evidence of rash   Neuro: CN II-XII intact, motor strength 4/5 bilateral deltoid, bicep, triceps, grip, hip flexor, knee extensors, ankle dorsiflexors/plantar flexors.  Sensory exam normal sensation to light touch lower extremities.  Speech with minimal dysarthria, sentence level, occasional word finding, but appears at baseline. Musculoskeletal: ,Back spasm below waist when extending LLE  Sensation intact LT Bilateral L3-4-5 S1 dermatomal distribution   Assessment/Plan: 1. Functional deficits which require 3+ hours per day of interdisciplinary therapy in a comprehensive inpatient rehab setting. Physiatrist is providing close team supervision and 24 hour management of active medical problems listed below. Physiatrist  and rehab team continue to assess barriers to discharge/monitor patient progress toward functional and medical goals  Care Tool:  Bathing    Body parts bathed by patient: Front perineal area, Abdomen, Chest, Left arm, Right arm, Face, Left upper leg, Right upper leg   Body parts bathed by helper: Buttocks     Bathing assist Assist Level: Moderate Assistance - Patient 50 - 74%     Upper Body Dressing/Undressing Upper body dressing   What is the patient wearing?: Pull over shirt    Upper body assist Assist Level: Moderate Assistance - Patient 50 - 74%    Lower Body Dressing/Undressing Lower body dressing      What is the patient wearing?: Incontinence brief, Pants     Lower body assist Assist for lower body dressing: Maximal Assistance - Patient 25 - 49%     Toileting Toileting Toileting Activity did not occur (Clothing management and hygiene only): N/A (no void or bm)  Toileting assist Assist for toileting: Maximal Assistance - Patient 25 - 49%     Transfers Chair/bed transfer  Transfers assist     Chair/bed transfer assist level: Maximal Assistance - Patient 25 - 49% (stand pivot) Chair/bed transfer assistive device: Programmer, multimedia   Ambulation assist      Assist level: Contact Guard/Touching assist Assistive device: Cane-quad Max distance: 272f   Walk 10 feet activity   Assist  Assist level: Contact Guard/Touching assist Assistive device: Cane-quad   Walk 50 feet activity   Assist    Assist level: Contact Guard/Touching assist Assistive device: Cane-quad    Walk 150 feet activity   Assist Walk 150 feet activity did not occur: Safety/medical concerns         Walk 10 feet on uneven surface  activity   Assist     Assist level: Contact Guard/Touching assist Assistive device: Cane-quad   Wheelchair     Assist Is the patient using a wheelchair?: No (only for time management due to slow gait speed)              Wheelchair 50 feet with 2 turns activity    Assist            Wheelchair 150 feet activity     Assist          Blood pressure 120/70, pulse 95, temperature 97.8 F (36.6 C), resp. rate 15, height '5\' 7"'$  (1.702 m), weight 55 kg, SpO2 94 %.  Medical Problem List and Plan: 1. 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  Antithrombotics: -DVT/anticoagulation: Coumadin resumed 04/29/2022 with bridging of Lovenox.  INR goal 2.5-3.5, INR still low , INR 2.5   3. Pain: lumbar scoliosis and stenosis , myofascial component ,   Lumbar xray no new changes vs MRI 5/23, although a small vertebral compression fracture may cause exacerbation of sx,  no MRI ordered since pt would need to be sedated with IV and no change in treatment would be necessary  CT scan reviewed looks like MRI from 03/2022.  Could still have end plate stress rxn but no change in treatment needed. May trial lumbar corset for comfort Now on Nucynta  '75mg'$  QID, has only received 2 doses but may increase to '100mg'$  if  ineffective  4. Mood/Sleep: Provide emotional support             -antipsychotic agents: N/A 5. Neuropsych/cognition: This patient is capable of making decisions on her own behalf. 6. Skin/Wound Care: Routine skin checks 7. Fluids/Electrolytes/Nutrition: Routine in and outs with follow-up chemistries 8.  History of mitral valve replacement 1997.  Resuming Coumadin with bridging of Lovenox 04/29/2022.  Follow-up per cardiology services as needed -7/6 INR above goal, coumadin held today, monitor for bleeding 7/7 pharmacy adjusting coumadin dose 9.  Hyperlipidemia.  Continue Lipitor 10.  Hypertension.  Continue Norvasc 5 mg daily Vitals:   05/23/22 0349  05/23/22 1256  BP: 110/67 120/70  Pulse: 98 95  Resp: 18 15  Temp: 98.6 F (37 C) 97.8 F (36.6 C)  SpO2: 97% 94%   7/16 Well controlled 11.  Prediabetes.  Hemoglobin A1c 6.1.  Diet controlled/SSI discontinued 12.  History of lumbar spinal stenosis  with increased radicular pain , would not rec reschedule  ESI due to anticoag issues -would not push distances for ambulation  13.  GERD.  Protonix 14. Insomnia She plans to find out what medication she takes at home for consideration of use at the hospital 15. Hypokalemia  -KCL 19mq x2 , recheck     Latest  Ref Rng & Units 05/04/2022    6:10 AM 05/03/2022    6:16 AM 04/30/2022   12:26 AM  BMP  Glucose 70 - 99 mg/dL 122  118  135   BUN 8 - 23 mg/dL '20  19  20   '$ Creatinine 0.44 - 1.00 mg/dL 0.87  0.73  0.75   Sodium 135 - 145 mmol/L 142  138  141   Potassium 3.5 - 5.1 mmol/L 4.1  3.4  3.7   Chloride 98 - 111 mmol/L 106  105  109   CO2 22 - 32 mmol/L '29  28  26   '$ Calcium 8.9 - 10.3 mg/dL 9.7  9.1  9.2    7/2 K+ at 4.1 (6/27), trend with weekly labs BMP tomorrow 7/17 16. Anemia mild  -Continue to monitor    Latest Ref Rng & Units 05/13/2022    5:23 AM 05/03/2022    6:16 AM 04/30/2022   12:26 AM  CBC  WBC 4.0 - 10.5 K/uL 6.4  4.4  6.0   Hemoglobin 12.0 - 15.0 g/dL 11.0  11.4  11.9   Hematocrit 36.0 - 46.0 % 34.9  35.8  37.8   Platelets 150 - 400 K/uL 244  149  171    continue to follow with weekly labs CBC tomorrow 7/17 17. Constipation  -She was given '30mg'$  sorbitol today, continue  senokot  -will give '30mg'$  more sorbitol for '60mg'$  today    LOS: 23 days A FACE TO FACE EVALUATION WAS PERFORMED  Jennye Boroughs 05/23/2022, 6:46 PM

## 2022-05-23 NOTE — Plan of Care (Signed)
  Problem: Consults Goal: RH STROKE PATIENT EDUCATION Description: See Patient Education module for education specifics  Outcome: Progressing   Problem: RH BOWEL ELIMINATION Goal: RH STG MANAGE BOWEL WITH ASSISTANCE Description: STG Manage Bowel with mod I Assistance. Outcome: Progressing Goal: RH STG MANAGE BOWEL W/MEDICATION W/ASSISTANCE Description: STG Manage Bowel with Medication with mod I Assistance. Outcome: Progressing   Problem: RH BLADDER ELIMINATION Goal: RH STG MANAGE BLADDER WITH ASSISTANCE Description: STG Manage Bladder With toileting Assistance Outcome: Progressing   Problem: RH SAFETY Goal: RH STG ADHERE TO SAFETY PRECAUTIONS W/ASSISTANCE/DEVICE Description: STG Adhere to Safety Precautions With cues Assistance/Device. Outcome: Progressing

## 2022-05-24 LAB — BASIC METABOLIC PANEL
Anion gap: 10 (ref 5–15)
BUN: 10 mg/dL (ref 8–23)
CO2: 27 mmol/L (ref 22–32)
Calcium: 9.2 mg/dL (ref 8.9–10.3)
Chloride: 99 mmol/L (ref 98–111)
Creatinine, Ser: 0.62 mg/dL (ref 0.44–1.00)
GFR, Estimated: >60 mL/min (ref >60)
Glucose, Bld: 117 mg/dL — ABNORMAL HIGH (ref 70–99)
Potassium: 3.6 mmol/L (ref 3.5–5.1)
Sodium: 136 mmol/L (ref 135–145)

## 2022-05-24 LAB — CBC
HCT: 36.2 % (ref 36.0–46.0)
Hemoglobin: 11.5 g/dL — ABNORMAL LOW (ref 12.0–15.0)
MCH: 27.7 pg (ref 26.0–34.0)
MCHC: 31.8 g/dL (ref 30.0–36.0)
MCV: 87.2 fL (ref 80.0–100.0)
Platelets: 242 10*3/uL (ref 150–400)
RBC: 4.15 MIL/uL (ref 3.87–5.11)
RDW: 13.7 % (ref 11.5–15.5)
WBC: 7.9 10*3/uL (ref 4.0–10.5)
nRBC: 0 % (ref 0.0–0.2)

## 2022-05-24 LAB — PROTIME-INR
INR: 4 — ABNORMAL HIGH (ref 0.8–1.2)
Prothrombin Time: 38.9 seconds — ABNORMAL HIGH (ref 11.4–15.2)

## 2022-05-24 MED ORDER — WARFARIN SODIUM 1 MG PO TABS
1.0000 mg | ORAL_TABLET | Freq: Once | ORAL | Status: AC
  Administered 2022-05-24: 1 mg via ORAL
  Filled 2022-05-24: qty 1

## 2022-05-24 MED ORDER — SENNOSIDES-DOCUSATE SODIUM 8.6-50 MG PO TABS
3.0000 | ORAL_TABLET | Freq: Two times a day (BID) | ORAL | Status: DC
  Administered 2022-05-24 – 2022-06-01 (×17): 3 via ORAL
  Filled 2022-05-24 (×17): qty 3

## 2022-05-24 MED ORDER — TAPENTADOL HCL 50 MG PO TABS
100.0000 mg | ORAL_TABLET | Freq: Four times a day (QID) | ORAL | Status: DC
  Administered 2022-05-24 – 2022-06-01 (×32): 100 mg via ORAL
  Filled 2022-05-24 (×33): qty 2

## 2022-05-24 NOTE — Progress Notes (Signed)
Physical Therapy Session Note  Patient Details  Name: [name] MRN: [id number] Date of Birth: Apr 20, 1946  Today's Date: 05/24/2022 PT Individual Time: 1420-1535 PT Individual Time Calculation (min): 75 min   Short Term Goals: Week 3:  PT Short Term Goal 1 (Week 3): Pt will perform supine<>sit with mod assist of 1 PT Short Term Goal 2 (Week 3): Pt will tolerate sitting on EOB for at least 5 minutes for ADL tasks PT Short Term Goal 3 (Week 3): Pt will perform sit<>stand transfers with mod assist of 1 PT Short Term Goal 4 (Week 3): Pt will consistently participate in bed<>chair/BSC transfers with mod assist of 1 PT Short Term Goal 5 (Week 3): Pt will participate in gait training of 98f using LRAD with mod assist of 1  Skilled Therapeutic Interventions/Progress Updates:     Patient in bed with her husband at bedside upon PT arrival. Patient alert and agreeable to PT session. Patient reported 6/10 back pain at rest at beginning of session, 10/10 pain with limited mobility during session, and 2/10 pain following interventions for pain management during session, LPN made aware and provided a muscle relaxer at beginning of session, patient also requested muscle rub and LPN is contacting pharmacy. PT provided repositioning, rest breaks, and distraction as pain interventions throughout session.   Patient's husband with questions regarding patient's stroke etiology and spinal pain. Educated per chart about SAH, areas of the brain impacted and the implications, and imaging findings of patient's spine. Encouraged patient and her husband to discuss further questions with the medical team.   Initiated therapeutic use of self for rapport building with patient due to increased pain response with mobility. Educated on pain neuroscience re-education and patient agreeable to participating in education, breathing techniques, and guided imagery to reduce pain response throughout session. Patient reports  improved back pain in standing PTA, slowly transitioned patient to supine with slight knee bend in the bed. Patient with strong guarding response throughout transition with muscle spasm, utilized cues for breath and relaxation to reduce spasm and guarding. Patient reports improved pain response once in supine.   Reviewed log roll technique and educated on back precautions for reduced pain with donning pants. Donned pants bed level with total A as patient performed log roll with mod A and facilitation for shoulder/hip/knees moving together with minimal increased pain with rolling. Initiated R side-lying to sit with mod-max A. Patient with increased pain and crying out at mid transition point and returned to lying. Reported significant 10/10 muscle spasms around L1-2 R>L. Returned patient to supine with max A for log roll and pain reduction. Focused remainder of session on relaxation techniques and L hemi-body NMR for spasticity management.   Performed prolonged stretch with intermittent rhythmic motion and graded inhibitory pressure to biceps to L upper extremity for elbow extension, progressing from ~90 deg to >120 deg. Educated patient's husband on prolonged stretch and inhibitory pressure to improve ROM and reduce tone. Performed prolonged stretch with intermittent rhythmic motion for hip/knee/ankle flexion focused on PF stretch with knee flexion, progressing to PF stretch with knee extension, progressed for lacking >10 deg DF to lacking <5 deg DF.  Patient reported increased muscle tension in her R side of her neck. Performed soft tissue mobilization to B sub occipitals, upper traps, SCM, and scalenes with gentle ROM to put muscles on stretch. Significant improvement in muscle tension following with patient reporting overall improved pain an relaxation (falling asleep intermittently). Responded well to therapeutic use of  touch for management of pain and muscle spasms.   Patient and her husband very  appreciative for patient's response to treatment.  Patient in bed at end of session with breaks locked, bed alarm set, and all needs within reach.   Therapy Documentation Precautions:  Precautions Precautions: Fall, Other (comment) Precaution Comments: pt has personal L LE AFO Restrictions Weight Bearing Restrictions: No    Therapy/Group: Individual Therapy  Armanda Forand L Naseem Adler PT, DPT, NCS, CBIS  05/24/2022, 5:50 PM

## 2022-05-24 NOTE — Progress Notes (Signed)
PROGRESS NOTE   Subjective/Complaints:  Constipation alternating with diarrhea Pain a little better   ROS: Pt denies SOB, abd pain, CP, and vision changes + constipation  Objective:   No results found. Recent Labs    05/24/22 0541  WBC 7.9  HGB 11.5*  HCT 36.2  PLT 242     Recent Labs    05/24/22 0541  NA 136  K 3.6  CL 99  CO2 27  GLUCOSE 117*  BUN 10  CREATININE 0.62  CALCIUM 9.2     Intake/Output Summary (Last 24 hours) at 05/24/2022 0744 Last data filed at 05/23/2022 1937 Gross per 24 hour  Intake 660 ml  Output --  Net 660 ml         Physical Exam: Vital Signs Blood pressure (!) 112/94, pulse 94, temperature 98.1 F (36.7 C), temperature source Oral, resp. rate 18, height '5\' 7"'$  (1.702 m), weight 55 kg, SpO2 98 %.   General: No acute distress, sitting in bed Mood and affect are appropriate Heart: Regular rate and rhythm no rubs murmurs or extra sounds Lungs: Clear to auscultation, breathing unlabored, no rales or wheezes, good air movement Abdomen: Positive bowel sounds, soft nontender to palpation, nondistended Extremities: No clubbing, cyanosis, or edema Skin: No evidence of breakdown, no evidence of rash   Neuro: CN II-XII intact, motor strength 4/5 bilateral deltoid, bicep, triceps, grip, hip flexor, knee extensors, ankle dorsiflexors/plantar flexors.  Sensory exam normal sensation to light touch lower extremities.  Speech with minimal dysarthria, sentence level, occasional word finding, but appears at baseline. Musculoskeletal: ,Back spasm below waist when extending LLE  Sensation intact LT Bilateral L3-4-5 S1 dermatomal distribution   Assessment/Plan: 1. Functional deficits which require 3+ hours per day of interdisciplinary therapy in a comprehensive inpatient rehab setting. Physiatrist is providing close team supervision and 24 hour management of active medical problems listed  below. Physiatrist and rehab team continue to assess barriers to discharge/monitor patient progress toward functional and medical goals  Care Tool:  Bathing    Body parts bathed by patient: Front perineal area, Abdomen, Chest, Left arm, Right arm, Face, Left upper leg, Right upper leg   Body parts bathed by helper: Buttocks     Bathing assist Assist Level: Moderate Assistance - Patient 50 - 74%     Upper Body Dressing/Undressing Upper body dressing   What is the patient wearing?: Pull over shirt    Upper body assist Assist Level: Moderate Assistance - Patient 50 - 74%    Lower Body Dressing/Undressing Lower body dressing      What is the patient wearing?: Incontinence brief, Pants     Lower body assist Assist for lower body dressing: Maximal Assistance - Patient 25 - 49%     Toileting Toileting Toileting Activity did not occur (Clothing management and hygiene only): N/A (no void or bm)  Toileting assist Assist for toileting: Maximal Assistance - Patient 25 - 49%     Transfers Chair/bed transfer  Transfers assist     Chair/bed transfer assist level: Maximal Assistance - Patient 25 - 49% (stand pivot) Chair/bed transfer assistive device: Programmer, multimedia   Ambulation assist  Assist level: Contact Guard/Touching assist Assistive device: Cane-quad Max distance: 210f   Walk 10 feet activity   Assist     Assist level: Contact Guard/Touching assist Assistive device: Cane-quad   Walk 50 feet activity   Assist    Assist level: Contact Guard/Touching assist Assistive device: Cane-quad    Walk 150 feet activity   Assist Walk 150 feet activity did not occur: Safety/medical concerns         Walk 10 feet on uneven surface  activity   Assist     Assist level: Contact Guard/Touching assist Assistive device: Cane-quad   Wheelchair     Assist Is the patient using a wheelchair?: No (only for time management due to  slow gait speed)             Wheelchair 50 feet with 2 turns activity    Assist            Wheelchair 150 feet activity     Assist          Blood pressure (!) 112/94, pulse 94, temperature 98.1 F (36.7 C), temperature source Oral, resp. rate 18, height '5\' 7"'$  (1.702 m), weight 55 kg, SpO2 98 %.  Medical Problem List and Plan: 1. 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  Antithrombotics: -DVT/anticoagulation: Coumadin resumed 04/29/2022 with bridging of Lovenox.  INR goal 2.5-3.5, INR still low , INR 2.5   3. Pain: lumbar scoliosis and stenosis , myofascial component ,   Lumbar xray no new changes vs MRI 5/23, although a small vertebral compression fracture may cause exacerbation of sx,  no MRI ordered since pt would need to be sedated with IV and no change in treatment would be necessary  CT scan reviewed looks like MRI from 03/2022.  Could still have end plate stress rxn but no change in treatment needed. May trial lumbar corset for comfort Now on Nucynta  '75mg'$  QID,increase to '100mg'$  an dmonitor for mental status changes  4. Mood/Sleep: Provide emotional support             -antipsychotic agents: N/A 5. Neuropsych/cognition: This patient is capable of making decisions on her own behalf. 6. Skin/Wound Care: Routine skin checks 7. Fluids/Electrolytes/Nutrition: Routine in and outs with follow-up chemistries 8.  History of mitral valve replacement 1997.  Resuming Coumadin with bridging of Lovenox 04/29/2022.  Follow-up per cardiology services as needed -7/6 INR above goal, coumadin held today, monitor for bleeding 7/7 pharmacy adjusting coumadin dose 9.  Hyperlipidemia.  Continue Lipitor 10.  Hypertension.  Continue Norvasc 5 mg  daily Vitals:   05/23/22 1935 05/24/22 0215  BP: 111/83 (!) 112/94  Pulse: (!) 102 94  Resp: 16 18  Temp: 98.4 F (36.9 C) 98.1 F (36.7 C)  SpO2: 95% 98%   7/16 Well controlled 11.  Prediabetes.  Hemoglobin A1c 6.1.  Diet controlled/SSI discontinued 12.  History of lumbar spinal stenosis  with increased radicular pain , would not rec reschedule  ESI due to anticoag issues -would not push distances for ambulation  13.  GERD.  Protonix 14. Insomnia She plans to find out what  medication she takes at home for consideration of use at the hospital 15. Hypokalemia  -KCL 16mq x2 , recheck     Latest Ref Rng & Units 05/24/2022    5:41 AM 05/04/2022    6:10 AM 05/03/2022    6:16 AM  BMP  Glucose 70 - 99 mg/dL 117  122  118   BUN 8 - 23 mg/dL '10  20  19   '$ Creatinine 0.44 - 1.00 mg/dL 0.62  0.87  0.73   Sodium 135 - 145 mmol/L 136  142  138   Potassium 3.5 - 5.1 mmol/L 3.6  4.1  3.4   Chloride 98 - 111 mmol/L 99  106  105   CO2 22 - 32 mmol/L '27  29  28   '$ Calcium 8.9 - 10.3 mg/dL 9.2  9.7  9.1    7/2 K+ at 4.1 (6/27), trend with weekly labs BMP normal 7/17 16. Anemia mild  -Continue to monitor    Latest Ref Rng & Units 05/24/2022    5:41 AM 05/13/2022    5:23 AM 05/03/2022    6:16 AM  CBC  WBC 4.0 - 10.5 K/uL 7.9  6.4  4.4   Hemoglobin 12.0 - 15.0 g/dL 11.5  11.0  11.4   Hematocrit 36.0 - 46.0 % 36.2  34.9  35.8   Platelets 150 - 400 K/uL 242  244  149    continue to follow with weekly labs Hgb on low side but stable 7/17 17. Constipation  -She was given '30mg'$  sorbitol today, continue  senokot  -will give '30mg'$  more sorbitol for '60mg'$  today    LOS: 24 days A FACE TO FACE EVALUATION WAS PERFORMED  ACharlett Blake7/17/2023, 7:44 AM

## 2022-05-24 NOTE — Progress Notes (Signed)
Occupational Therapy Session Note  Patient Details  Name: [name] MRN: [id number] Date of Birth: 1946-04-05  Today's Date: 05/24/2022 OT Individual Time: 1010-1048 OT Individual Time Calculation (min): 38 min  Missed 7 mins( therapist running late from previous session)   Short Term Goals: Week 3:  OT Short Term Goal 1 (Week 3): STG=LTG  Skilled Therapeutic Interventions/Progress Updates:  Pt greeted supine in bed reporting needing void b/b. Offered OOB transfer to [facility] as this OTA noted pt was able to transfer to [facility] this weekend. However pt declined d/t pain, MOD A to roll to get on bed pan, noted decreased pain when rolling this date with pt not screaming out in pain this time. Pt with + urine void needing MAX A for all toileting tasks and MOD A to roll off bed pan.  Pt c/o nausea during session with LPN entering to provide nausea meds, pt did agree to transition supine>sit with overall total A. Utilized more of a supine >sit method with slow incremental movements however as soon as pt in sitting position pt reports 10/10 pain needing to lay down however LPN handed pt her meds and pt was able to sit EOB with MIN A briefly but then returned to MAX A with R lateral lean/pushing as soon as she took her meds.  Pt left supine in bed with heat pad applied to lower back for pain mgmt and all needs within reach.   Therapy Documentation Precautions:  Precautions Precautions: Fall, Other (comment) Precaution Comments: pt has personal L LE AFO Restrictions Weight Bearing Restrictions: No  Pain: 10/10 in low back, utilized relaxation, deep breathing, and heat as pain mgmt.    Therapy/Group: Individual Therapy  Corinne Ports [facility] 05/24/2022, 12:03 PM

## 2022-05-24 NOTE — Progress Notes (Addendum)
ANTICOAGULATION CONSULT NOTE- follow-up  Pharmacy Consult for Warfarin Indication:  mechanical mitral valve  Allergies  Allergen Reactions   Zoloft [Sertraline] Hives, Itching and Rash    Patient Measurements: Height: '5\' 7"'$  (170.2 cm) Weight: 55 kg (121 lb 4.1 oz) IBW/kg (Calculated) : 61.6   Vital Signs: Temp: 98.1 F (36.7 C) (07/17 0215) Temp Source: Oral (07/17 0215) BP: 112/94 (07/17 0215) Pulse Rate: 94 (07/17 0215)  Labs: Recent Labs    05/24/22 0541  HGB 11.5*  HCT 36.2  PLT 242  LABPROT 38.9*  INR 4.0*  CREATININE 0.62     Estimated Creatinine Clearance: 51.9 mL/min (by C-G formula based on SCr of 0.62 mg/dL).   Assessment: 76 YO female with medical history significant for mMVR in 1997 on warfarin PTA.  Patient presented to the ED with confusion and slurred speech found to have acute SAH. Lovenox / warfarin from PTA was then held on admission and resumed 6/22. Lovenox bridge completed. Pharmacy consulted to manage warfarin during rehab admission.   INR 4.0 No signs of bleeding. Appetite has been good eating 100% of her meals over the past 3 days.   Goal of Therapy:  INR 2.5-3.5 Monitor platelets by anticoagulation protocol: Yes   Plan:  Give coumadin 1 mg po today Check INR 7/18 Continue to monitor H&H and platelets    Thank you for involving pharmacy in this patient's care.  Vaughan Basta BS, PharmD, BCPS Clinical Pharmacist 05/24/2022 9:01 AM  Contact: [phone] after 3 PM  "Be curious, not judgmental..." -Jamal Maes

## 2022-05-24 NOTE — Progress Notes (Addendum)
Physical Therapy Session Note  Patient Details  Name: [name] MRN: [id number] Date of Birth: 11-Mar-1946  Today's Date: 05/24/2022 PT Individual Time: 1740-8144 PT Individual Time Calculation (min): 21 min  Missed time 9 min 2/2 pain. Short Term Goals: Week 3:  PT Short Term Goal 1 (Week 3): Pt will perform supine<>sit with mod assist of 1 PT Short Term Goal 2 (Week 3): Pt will tolerate sitting on EOB for at least 5 minutes for ADL tasks PT Short Term Goal 3 (Week 3): Pt will perform sit<>stand transfers with mod assist of 1 PT Short Term Goal 4 (Week 3): Pt will consistently participate in bed<>chair/BSC transfers with mod assist of 1 PT Short Term Goal 5 (Week 3): Pt will participate in gait training of 63f using LRAD with mod assist of 1  Skilled Therapeutic Interventions/Progress Updates: Pt presents semi-reclined in bed eating breakfast.  Pt hesitant to participate w/ therapy but encouraged to transfer to recliner for completing breakfast.  Pt states reasons correctly that were discussed previously for pain by PT.   Pt attempted log roll technique per pt request, and roll to R w/ mod A.  Pt requires Total A for sidelying to sit w/ continued requests to lie back down as pain increased to 8/10.  Pt returned to supine w/ mas to total A.  Pt given cues for PLB for decreased anxiety about pain as well as pain itself.  Pain decreased to 4/10.  Pt bridged and w/ mod A scoots to EOB.  Pt requires total A for sup to sit w/ pain increasing to 8/10 and refusal to continue.  [facility]  Pt remained in bed w/ bed alarm on and all needs in reach.     Therapy Documentation Precautions:  Precautions Precautions: Fall, Other (comment) Precaution Comments: pt has personal L LE AFO Restrictions Weight Bearing Restrictions: No General: PT Amount of Missed Time (min): 9 Minutes PT Missed Treatment Reason: Pain Vital Signs:  Pain:4/10 initially, increased to 8/10 w/  activity, pain meds already given per pt. Pain Assessment Pain Scale: 0-10 Pain Score: 5  Pain Location: Back Pain Orientation: Lower Pain Intervention(s): Medication (See eMAR)      Therapy/Group: Individual Therapy  JLadoris Gene7/17/2023, 9:09 AM

## 2022-05-24 NOTE — Progress Notes (Signed)
Speech Language Pathology Daily Session Note  Patient Details  Name: [name] MRN: [id number] Date of Birth: 12/17/1945  Today's Date: 05/24/2022 SLP Individual Time: 1300-1345 SLP Individual Time Calculation (min): 45 min  Short Term Goals: Week 3: SLP Short Term Goal 1 (Week 3): STG=LTG due to ELOS SLP Short Term Goal 1 - Progress (Week 3): Progressing toward goal  Skilled Therapeutic Interventions: Skilled ST treatment focused on cognitive-linguistic goals. Pt requested to work on verbal reasoning/attention task completed in previous ST session(s). SLP facilitated generative/divergent naming task based on specific categories (i.e. summertime, foods) based on specific initial letters. Pt completed task with overall min-to-mod A for attention, working memory with use of external aid, and self-monitoring/correction. Pt reported she enjoyed the challenge but feels like this type of task should come easier. SLP provided verbal encouragement and additional education on cognitive-communication s/p CVA. Spouse very supportive throughout. Patient was left in bed with alarm activated and immediate needs within reach at end of session. Continue per current plan of care.       Pain Pain Assessment Pain Score: 2  Pain Location: Back Pain Orientation: Lower Pain Descriptors / Indicators: Aching Pain Onset: On-going Multiple Pain Sites: No  Therapy/Group: Individual Therapy  Patty Sermons 05/24/2022, 1:13 PM

## 2022-05-24 NOTE — Progress Notes (Signed)
Occupational Therapy Weekly Progress Note  Patient Details  Name: [name] MRN: [id number] Date of Birth: [birth year]  Beginning of progress report period: May 17, 2022 End of progress report period: May 24, 2022  Today's Date: 05/24/2022   Continue working towards LTGs. Pt continues to be limited by pain  Mainly Left sided sacral pain impacting pts progression towards OT goals. Pt currently requires MOD A for UB ADLS, MAX A for LB ADLS, MAX A for ADL transfers and MAX A for 3/3 toileting tasks.  Patient continues to demonstrate the following deficits: muscle weakness and acute pain, decreased cardiorespiratoy endurance, impaired timing and sequencing, abnormal tone, and unbalanced muscle activation, decreased attention, decreased problem solving, and decreased safety awareness, and decreased sitting balance, decreased standing balance, decreased postural control, and decreased balance strategies and therefore will continue to benefit from skilled OT intervention to enhance overall performance with BADL and Reduce care partner burden.  Patient progressing toward long term goals..  Continue plan of care.  OT Short Term Goals Week 1:  OT Short Term Goal 1 (Week 1): Pt to be CGA for functional transfers with LRD OT Short Term Goal 1 - Progress (Week 1): Not met OT Short Term Goal 2 (Week 1): Pt to be Min A for LB dressing/bathing with LRD OT Short Term Goal 2 - Progress (Week 1): Not met OT Short Term Goal 3 (Week 1): Pt to be Min A for toileting tasks with LRD OT Short Term Goal 3 - Progress (Week 1): Not met OT Short Term Goal 4 (Week 1): Pt to demonstrate increased coordination to the R UE during functional tasks OT Short Term Goal 4 - Progress (Week 1): Progressing toward goal Week 2:  OT Short Term Goal 1 (Week 2): Patient will transition from sit to stand with mod assist to prepare for toilet transfer OT Short Term Goal 1 - Progress (Week 2): Met OT Short Term Goal 2 (Week 2):  Patient will don pants over feet with no more than min assist OT Short Term Goal 2 - Progress (Week 2): Progressing toward goal OT Short Term Goal 3 (Week 2): Patient will pull pants over hips with no more than mod assists OT Short Term Goal 3 - Progress (Week 2): Progressing toward goal OT Short Term Goal 4 (Week 2): Patient will maintain standing with BUE in support for 10 seconds with mod assist in preparation for LB ADL OT Short Term Goal 4 - Progress (Week 2): Met Week 3:  OT Short Term Goal 1 (Week 3): STG=LTG Week 4:  OT Short Term Goal 1 (Week 4): continue working towards LTGs   Therapy Documentation Precautions:  Precautions Precautions: Fall, Other (comment) Precaution Comments: pt has personal L LE AFO Restrictions Weight Bearing Restrictions: No    Therapy/Group: Individual Therapy  Barron Schmid 05/24/2022, 12:13 PM

## 2022-05-25 LAB — PROTIME-INR
INR: 3.4 — ABNORMAL HIGH (ref 0.8–1.2)
Prothrombin Time: 34.2 seconds — ABNORMAL HIGH (ref 11.4–15.2)

## 2022-05-25 MED ORDER — WARFARIN SODIUM 2 MG PO TABS
2.0000 mg | ORAL_TABLET | ORAL | Status: DC
  Administered 2022-05-26 – 2022-05-30 (×2): 2 mg via ORAL
  Filled 2022-05-25 (×3): qty 1

## 2022-05-25 MED ORDER — WARFARIN SODIUM 2.5 MG PO TABS
4.5000 mg | ORAL_TABLET | ORAL | Status: DC
  Administered 2022-05-25 – 2022-05-29 (×4): 4.5 mg via ORAL
  Filled 2022-05-25 (×5): qty 1

## 2022-05-25 NOTE — Progress Notes (Signed)
Patient resting but has verbalize excruciating pain and discomfort to lower back and radiating discomfort of spasms, aching and she has difficulty in repositioning herself for any length of time  to rest..KPAD remains as ordered to lower back and right flank especially with attempts to reposition herself and LE"S., patient was medicated as ordered but refuse her prn ( Tylenol) medication for discomfort pain, stating "this will not help my pain":. Continue Nucynta '100mg'$  Q6 hrs as ordered.Made as comfortable as possible. Call bell placed within reach, bed alarm on and monitor on hourly rounds

## 2022-05-25 NOTE — Progress Notes (Signed)
Physical Therapy Weekly Progress Note  Patient Details  Name: [name] MRN: [id number] Date of Birth: 03-25-1946  Beginning of progress report period: May 18, 2022 End of progress report period: May 25, 2022  Patient has met 1 of 5 short term goals. Ms. Mercier continues to be limited primarily due to increased low back pain with mobility. She is performing supine<>sit with max assist, sit<>stands and stand pivot transfers using B HHA with max assist, and is tolerating OOB position in the recliner between therapy sessions. She has yet to progress back to gait training at this time. She is utilizing LSO for OOB mobility to aid with pain management. She continues to demonstrate some confusion with perseveration on certain topics of conversation benefiting from redirection. Due to pt's decline in mobility her POC has been adjusted to recommend D/C to SNF to allow longer duration of skilled physical therapy interventions to increase her independence with functional mobility prior to D/Cing home with her husband's support.  Patient continues to demonstrate the following deficits muscle weakness and muscle joint tightness, decreased cardiorespiratoy endurance, impaired timing and sequencing, abnormal tone, unbalanced muscle activation, and decreased motor planning, decreased midline orientation, decreased initiation, decreased attention, decreased awareness, decreased problem solving, decreased safety awareness, decreased memory, and delayed processing, and decreased sitting balance, decreased standing balance, decreased postural control, and decreased balance strategies and therefore will continue to benefit from skilled PT intervention to increase functional independence with mobility.  Patient not progressing toward long term goals.  See goal revision..  Plan of care revisions: Changed D/C recommendations to SNF.  PT Short Term Goals Week 3:  PT Short Term Goal 1 (Week 3): Pt will perform  supine<>sit with mod assist of 1 PT Short Term Goal 1 - Progress (Week 3): Progressing toward goal PT Short Term Goal 2 (Week 3): Pt will tolerate sitting on EOB for at least 5 minutes for ADL tasks PT Short Term Goal 2 - Progress (Week 3): Met PT Short Term Goal 3 (Week 3): Pt will perform sit<>stand transfers with mod assist of 1 PT Short Term Goal 3 - Progress (Week 3): Progressing toward goal PT Short Term Goal 4 (Week 3): Pt will consistently participate in bed<>chair/BSC transfers with mod assist of 1 PT Short Term Goal 4 - Progress (Week 3): Progressing toward goal PT Short Term Goal 5 (Week 3): Pt will participate in gait training of 73ft using LRAD with mod assist of 1 PT Short Term Goal 5 - Progress (Week 3): Not met Week 4:  PT Short Term Goal 1 (Week 4): = to LTGs based on ELOS  Skilled Therapeutic Interventions/Progress Updates:  Ambulation/gait training;Community reintegration;DME/adaptive equipment instruction;Neuromuscular re-education;Psychosocial support;Stair training;UE/LE Strength taining/ROM;Balance/vestibular training;Discharge planning;Functional electrical stimulation;Pain management;Skin care/wound management;Therapeutic Activities;UE/LE Coordination activities;Cognitive remediation/compensation;Disease management/prevention;Functional mobility training;Patient/family education;Splinting/orthotics;Therapeutic Exercise;Visual/perceptual remediation/compensation    Therapy Documentation Precautions:  Precautions Precautions: Fall, Other (comment) Precaution Comments: pt has personal L LE AFO, LSO when OOB for pt comfort Restrictions Weight Bearing Restrictions: No   Tawana Scale , PT, DPT, NCS, CSRS 05/25/2022, 7:57 AM

## 2022-05-25 NOTE — Progress Notes (Addendum)
ANTICOAGULATION CONSULT NOTE- follow-up  Pharmacy Consult for Warfarin Indication:  mechanical mitral valve  Allergies  Allergen Reactions   Zoloft [Sertraline] Hives, Itching and Rash    Patient Measurements: Height: '5\' 7"'$  (170.2 cm) Weight: 55 kg (121 lb 4.1 oz) IBW/kg (Calculated) : 61.6   Vital Signs:    Labs: Recent Labs    05/24/22 0541 05/25/22 0527  HGB 11.5*  --   HCT 36.2  --   PLT 242  --   LABPROT 38.9* 34.2*  INR 4.0* 3.4*  CREATININE 0.62  --      Estimated Creatinine Clearance: 51.9 mL/min (by C-G formula based on SCr of 0.62 mg/dL).   Assessment: 76 YO female with medical history significant for mMVR in 1997 on warfarin PTA.  Patient presented to the ED with confusion and slurred speech found to have acute SAH. Lovenox / warfarin from PTA was then held on admission and resumed 6/22. Lovenox bridge completed. Pharmacy consulted to manage warfarin during rehab admission.   INR 3.4 No signs of bleeding. Warfarin 1 mg given 7/17 due to elevated INR of 4.0. Total weekly dose reduced by ~10% 7/18.   Goal of Therapy:  INR 2.5-3.5 Monitor platelets by anticoagulation protocol: Yes   Plan:  Reduce Warfarin regimen to 4.5 mg PO daily except 2 mg PO Wed/Sun Check INR 7/19 Continue to monitor H&H and platelets  Francena Hanly, PharmD Pharmacy Resident  05/25/2022 9:55 AM ___________________________________________________________________________ I discussed / reviewed the pharmacy note by Dr. Oletta Lamas and I agree with the resident's findings and plans as documented. Vaughan Basta BS, PharmD, BCPS Clinical Pharmacist 05/25/2022 10:28 AM  Contact: [phone] after 3 PM  "Be curious, not judgmental..." -Jamal Maes ___________________________________________________________________________

## 2022-05-25 NOTE — Progress Notes (Signed)
Occupational Therapy Session Note  Patient Details  Name: [name] MRN: [id number] Date of Birth: August 18, 1946  Today's Date: 05/25/2022 OT Individual Time: 6712-4580DXIPJAS 1 OT Individual Time Calculation (min): 62 min  Session 2: 1330-1436    Short Term Goals: Week 4:  OT Short Term Goal 1 (Week 4): continue working towards [employer]  Skilled Therapeutic Interventions/Progress Updates:  Session 1: Pt greeted supine in bed, pt  agreeable to OT intervention. Pt was able to transition from supine>sit with MOD A +2 via log roll technique. Pt able to sit EOB with initial MODA progressing to CGA with increased time repositioning and deep breathing strategies. Elevated EOB with pt able to stand x2 with MIN HHA+2. On second stand pt initiated taking steps stating " lets get to the chair," pt able to take steps to recliner with MOD A +2, pt with narrow BOS needing recliner to be brought up to her as pt with difficulty stepping backwards to recliner.  Once in recliner pt able to dress with MOD A for UB dressing and MAX A +2 for LB dressing as pt needed assist to stand and second person to pull pants up to waist line.  Pt very pleased with ability to progress OOB today, left pt up in recliner with feet elevated and pts music of choice playing from computer to promote relaxation and facilitate healthy coping strategies.   Session 2: Pt greeted seated in recliner, pt    agreeable to OT intervention. Pt was able to tolerate sitting up from prior PT session with pt agreeable to transfer to w/c to have her hair washed. Pt needed MAX A +2 to take steps to w/c via stand pivot, pt with decreased ability to advance BLEs during transfer needing assist to facilitate steps.  Pt tolerate OOB task of having her hair washed with excellent tolerance and participation. Pt reports this is the best thing that's happened to her all day, feel this task was very beneficial for pts overall wellbeing and mental health.   Pt  completed lateral scoot transfer from w/c>DABCS with MODA +1. Pt with + urine void needing MAX A +2 for 3/3 toileting tasks as pt needs assist to stand while second person assists with clothing mgmt. Pt completed squat pivot back to bed with MOD A +1. Pt completed supine>sit with MOD A. Pt did grimaces during transitional movements during session but seems to be tolerated movement better. Pt left supine in bed with bed alarm activated and all needs within reach.                Therapy Documentation Precautions:  Precautions Precautions: Fall, Other (comment) Precaution Comments: pt has personal L LE AFO Restrictions Weight Bearing Restrictions: No    Pain: Session 1: 5/10 pain in low back, utilized distraction, heat and rest breaks as pain mgmt strategies  Session 2: unrated pain in low back and down to LLE, repositioning, rest breaks, and distraction utilized as pain mgmt.     Therapy/Group: Individual Therapy  Corinne Ports [facility] 05/25/2022, 12:06 PM

## 2022-05-25 NOTE — Progress Notes (Signed)
PROGRESS NOTE   Subjective/Complaints:  Pt "down" about her situation, PT notes indicate some improvement in activity tolerance   ROS: Pt denies SOB, abd pain, CP, and vision changes + constipation  Objective:   No results found. Recent Labs    05/24/22 0541  WBC 7.9  HGB 11.5*  HCT 36.2  PLT 242      Recent Labs    05/24/22 0541  NA 136  K 3.6  CL 99  CO2 27  GLUCOSE 117*  BUN 10  CREATININE 0.62  CALCIUM 9.2      Intake/Output Summary (Last 24 hours) at 05/25/2022 0752 Last data filed at 05/25/2022 0610 Gross per 24 hour  Intake 720 ml  Output 1200 ml  Net -480 ml         Physical Exam: Vital Signs Blood pressure 108/83, pulse 96, temperature 98.3 F (36.8 C), resp. rate 16, height '5\' 7"'$  (1.702 m), weight 55 kg, SpO2 95 %.   General: No acute distress, sitting in bed Mood and affect are appropriate Heart: Regular rate and rhythm no rubs murmurs or extra sounds Lungs: Clear to auscultation, breathing unlabored, no rales or wheezes, good air movement Abdomen: Positive bowel sounds, soft nontender to palpation, nondistended Extremities: No clubbing, cyanosis, or edema Skin: No evidence of breakdown, no evidence of rash   Neuro: CN II-XII intact, motor strength 4/5 bilateral deltoid, bicep, triceps, grip, hip flexor, knee extensors, ankle dorsiflexors/plantar flexors.  Sensory exam normal sensation to light touch lower extremities.  Speech with minimal dysarthria, sentence level, occasional word finding, but appears at baseline. Musculoskeletal: ,Back spasm below waist when extending LLE  Sensation intact LT Bilateral L3-4-5 S1 dermatomal distribution   Assessment/Plan: 1. Functional deficits which require 3+ hours per day of interdisciplinary therapy in a comprehensive inpatient rehab setting. Physiatrist is providing close team supervision and 24 hour management of active medical problems  listed below. Physiatrist and rehab team continue to assess barriers to discharge/monitor patient progress toward functional and medical goals  Care Tool:  Bathing    Body parts bathed by patient: Front perineal area, Abdomen, Chest, Left arm, Right arm, Face, Left upper leg, Right upper leg   Body parts bathed by helper: Buttocks     Bathing assist Assist Level: Moderate Assistance - Patient 50 - 74%     Upper Body Dressing/Undressing Upper body dressing   What is the patient wearing?: Pull over shirt    Upper body assist Assist Level: Moderate Assistance - Patient 50 - 74%    Lower Body Dressing/Undressing Lower body dressing      What is the patient wearing?: Incontinence brief, Pants     Lower body assist Assist for lower body dressing: Maximal Assistance - Patient 25 - 49%     Toileting Toileting Toileting Activity did not occur (Clothing management and hygiene only): N/A (no void or bm)  Toileting assist Assist for toileting: Maximal Assistance - Patient 25 - 49%     Transfers Chair/bed transfer  Transfers assist     Chair/bed transfer assist level: Maximal Assistance - Patient 25 - 49% (stand pivot) Chair/bed transfer assistive device: Programmer, multimedia  Ambulation assist      Assist level: Contact Guard/Touching assist Assistive device: Cane-quad Max distance: 275f   Walk 10 feet activity   Assist     Assist level: Contact Guard/Touching assist Assistive device: Cane-quad   Walk 50 feet activity   Assist    Assist level: Contact Guard/Touching assist Assistive device: Cane-quad    Walk 150 feet activity   Assist Walk 150 feet activity did not occur: Safety/medical concerns         Walk 10 feet on uneven surface  activity   Assist     Assist level: Contact Guard/Touching assist Assistive device: Cane-quad   Wheelchair     Assist Is the patient using a wheelchair?: No (only for time management due  to slow gait speed)             Wheelchair 50 feet with 2 turns activity    Assist            Wheelchair 150 feet activity     Assist          Blood pressure 108/83, pulse 96, temperature 98.3 F (36.8 C), resp. rate 16, height '5\' 7"'$  (1.702 m), weight 55 kg, SpO2 95 %.  Medical Problem List and Plan: 1. 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  Antithrombotics: -DVT/anticoagulation: Coumadin resumed 04/29/2022 with bridging of Lovenox.  INR goal 2.5-3.5, INR still low , INR 2.5   3. Pain: lumbar scoliosis and stenosis , myofascial component ,   Lumbar xray no new changes vs MRI 5/23, although a small vertebral compression fracture may cause exacerbation of sx,  no MRI ordered since pt would need to be sedated with IV and no change in treatment would be necessary  CT scan reviewed looks like MRI from 03/2022.  Could still have end plate stress rxn but no change in treatment needed. May trial lumbar corset for comfort Now on Nucynta  '75mg'$  QID,increase to '100mg'$  an dmonitor for mental status changes  4. Mood/Sleep: Provide emotional support             -antipsychotic agents: N/A 5. Neuropsych/cognition: This patient is capable of making decisions on her own behalf. 6. Skin/Wound Care: Routine skin checks 7. Fluids/Electrolytes/Nutrition: Routine in and outs with follow-up chemistries 8.  History of mitral valve replacement 1997.  Resuming Coumadin with bridging of Lovenox 04/29/2022.  Follow-up per cardiology services as needed -7/6 INR above goal, coumadin held today, monitor for bleeding 7/7 pharmacy adjusting coumadin dose 9.  Hyperlipidemia.  Continue Lipitor 10.  Hypertension.  Continue Norvasc 5 mg daily Vitals:   05/24/22  1445 05/24/22 1901  BP: 107/66 108/83  Pulse: 85 96  Resp: 17 16  Temp: 98 F (36.7 C) 98.3 F (36.8 C)  SpO2: 96% 95%   7/18 Well controlled 11.  Prediabetes.  Hemoglobin A1c 6.1.  Diet controlled/SSI discontinued 12.  History of lumbar spinal stenosis  with increased radicular pain , would not rec reschedule  ESI due to anticoag issues -would not push distances for ambulation  13.  GERD.  Protonix 14. Insomnia She plans to find out  what medication she takes at home for consideration of use at the hospital 15. Hypokalemia  -KCL 18mq x2 , recheck     Latest Ref Rng & Units 05/24/2022    5:41 AM 05/04/2022    6:10 AM 05/03/2022    6:16 AM  BMP  Glucose 70 - 99 mg/dL 117  122  118   BUN 8 - 23 mg/dL '10  20  19   '$ Creatinine 0.44 - 1.00 mg/dL 0.62  0.87  0.73   Sodium 135 - 145 mmol/L 136  142  138   Potassium 3.5 - 5.1 mmol/L 3.6  4.1  3.4   Chloride 98 - 111 mmol/L 99  106  105   CO2 22 - 32 mmol/L '27  29  28   '$ Calcium 8.9 - 10.3 mg/dL 9.2  9.7  9.1    7/2 K+ at 4.1 (6/27), trend with weekly labs BMP normal 7/17 16. Anemia mild  -Continue to monitor    Latest Ref Rng & Units 05/24/2022    5:41 AM 05/13/2022    5:23 AM 05/03/2022    6:16 AM  CBC  WBC 4.0 - 10.5 K/uL 7.9  6.4  4.4   Hemoglobin 12.0 - 15.0 g/dL 11.5  11.0  11.4   Hematocrit 36.0 - 46.0 % 36.2  34.9  35.8   Platelets 150 - 400 K/uL 242  244  149    continue to follow with weekly labs Hgb on low side but stable 7/17 17. Constipation  -She was given '30mg'$  sorbitol today, continue  senokot  -will give '30mg'$  more sorbitol for '60mg'$  today    LOS: 25 days A FACE TO FACE EVALUATION WAS PERFORMED  ACharlett Blake7/18/2023, 7:52 AM

## 2022-05-25 NOTE — Progress Notes (Signed)
Physical Therapy Session Note  Patient Details  Name: [name] MRN: [id number] Date of Birth: 1946/08/09  Today's Date: 05/25/2022 PT Individual Time: 0952-1100 PT Individual Time Calculation (min): 68 min   Short Term Goals: Week 3:  PT Short Term Goal 1 (Week 3): Pt will perform supine<>sit with mod assist of 1 PT Short Term Goal 2 (Week 3): Pt will tolerate sitting on EOB for at least 5 minutes for ADL tasks PT Short Term Goal 3 (Week 3): Pt will perform sit<>stand transfers with mod assist of 1 PT Short Term Goal 4 (Week 3): Pt will consistently participate in bed<>chair/BSC transfers with mod assist of 1 PT Short Term Goal 5 (Week 3): Pt will participate in gait training of 2f using LRAD with mod assist of 1  Skilled Therapeutic Interventions/Progress Updates:     Pt received sitting in recliner with her husband, BMikki Massey present and pt agreeable to therapy session despite reporting significant fatigue stating "I feel like I could sleep for days." When therapist went to lower leg rest of recliner pt grimaces in pain and/or also in anticipation of pain - therapist provided calming/relaxation and distraction techniques for pain management. Pt reports pain located "on left side" of her spine. Rates pain at beginning of session as 2.5/10. Donned LSO total assist to wear during mobility with pt having some grimacing while leaning forward in seat - during session pt states she forgot she even had the LSO on so difficult to determine if it is helping with pain management.   Sit>stand recliner>B HHA on therapists's shoulders to promote anterior trunk lean with heavy mod/light max assist for lifting to stand. Pt continues to demo extreme posterior lean with knees flexed today.   R stand pivot transfer recliner>w/c with heavy max assist of 1 for balance and safety with pt continuing to have knees flexed and L knee moderately buckling when pt stepping with R LE  to complete transfer - continues to  have strong posterior lean throughout the transfer.   Of note: during session, pt experiences pain in L lower back every time lifting L LE on/off w/c let rest.    Transported to/from gym in w/c for time management and energy conservation.   Sit<>stands to/from w/c in // bars with mod assist for lifting to stand now that pt is able to utilize the bars to pull herself up into standing - pt demos ~20degree posterior lean in standing with B knees partially flexed - attempted to utilize mirror feedback and multimodal cuing to promote increased anterior trunk lean with pt attempting instead to step her feet forward. Pt states she feels like she is standing straight up despite having this severe posterior lean.   On 3rd stand, moved to B HHA on therapist's shoulders while still in // bars for safety to try and promote increased anterior weight shift; however, pt had strong LEFT posterior lean with L knee flexed in standing and pt resting her L hip on the // bar for balance - pt requiring max assit to maintain upright - attempted to provide multimodal cuing for pt to weight shift towards midline with minimal improvement.   Pt tolerates standing at least ~20seconds each time.   Rates L lower back pain at 4/10 after standing.   Transported pt back to room in w/c.   L stand pivot w/c>recliner with heavy max assist of 1 for lifting and balance while turning - therapist blocking L knee to allow pt to step backwards  with R LE towards seat.   Therapist assisted with repositioning pt to have LEs elevated, L UE supported on pillow, heating pad donned to her back, and neck supported with towel roll. Pt left seated in recliner with needs in reach and her husband present.  Therapy Documentation Precautions:  Precautions Precautions: Fall, Other (comment) Precaution Comments: pt has personal L LE AFO Restrictions Weight Bearing Restrictions: No   Pain: Pt does report pain starts on L side of spine today but  during session says that it moves between the L and R side but primarily is localized to "one spot" on L side of her spine. Pt reports pain increases to 4/10 with mobility but then started demonstrating grimacing and tensing up indicating pain is starting to worsen with additional standing trials. Pt premedicated and therapist provided emotional support, distraction, relaxation, and breathing techniques for pain management during session.    Therapy/Group: Individual Therapy  Tawana Scale , PT, DPT, NCS, CSRS 05/25/2022, 9:41 PM

## 2022-05-26 ENCOUNTER — Other Ambulatory Visit (HOSPITAL_COMMUNITY): Payer: Self-pay

## 2022-05-26 LAB — PROTIME-INR
INR: 3 — ABNORMAL HIGH (ref 0.8–1.2)
Prothrombin Time: 30.8 seconds — ABNORMAL HIGH (ref 11.4–15.2)

## 2022-05-26 LAB — GLUCOSE, CAPILLARY: Glucose-Capillary: 122 mg/dL — ABNORMAL HIGH (ref 70–99)

## 2022-05-26 MED ORDER — TAPENTADOL HCL 100 MG PO TABS
100.0000 mg | ORAL_TABLET | Freq: Four times a day (QID) | ORAL | 0 refills | Status: DC
  Filled 2022-05-26: qty 28, 7d supply, fill #0

## 2022-05-26 MED ORDER — OMEPRAZOLE 20 MG PO CPDR
20.0000 mg | DELAYED_RELEASE_CAPSULE | Freq: Every day | ORAL | 0 refills | Status: DC
  Filled 2022-05-26: qty 30, 30d supply, fill #0

## 2022-05-26 MED ORDER — TAPENTADOL HCL 100 MG PO TABS: 100.0000 mg | ORAL_TABLET | Freq: Four times a day (QID) | ORAL | 0 refills | Status: DC

## 2022-05-26 MED ORDER — AMLODIPINE BESYLATE 5 MG PO TABS
5.0000 mg | ORAL_TABLET | Freq: Every day | ORAL | 0 refills | Status: DC
Start: 2022-05-26 — End: 2022-08-30
  Filled 2022-05-26: qty 30, 30d supply, fill #0

## 2022-05-26 MED ORDER — METHOCARBAMOL 500 MG PO TABS
500.0000 mg | ORAL_TABLET | Freq: Three times a day (TID) | ORAL | 0 refills | Status: AC
  Filled 2022-05-26: qty 90, 30d supply, fill #0

## 2022-05-26 MED ORDER — ATORVASTATIN CALCIUM 40 MG PO TABS
40.0000 mg | ORAL_TABLET | Freq: Every day | ORAL | 0 refills | Status: AC
  Filled 2022-05-26: qty 30, 30d supply, fill #0

## 2022-05-26 MED ORDER — ACETAMINOPHEN 325 MG PO TABS: 325.0000 mg | ORAL_TABLET | ORAL | Status: AC | PRN

## 2022-05-26 NOTE — Progress Notes (Signed)
Patient ID: [name], female   DOB: 27-Oct-1946, 76 y.o.   MRN: [id number]  Patient referral sent to [facility] for review.

## 2022-05-26 NOTE — Progress Notes (Signed)
Occupational Therapy Session Note  Patient Details  Name: [name] MRN: [id number] Date of Birth: May 22, 1946  Today's Date: 05/26/2022 OT Individual Time: 2841-3244 OT Individual Time Calculation (min): 83 min    Short Term Goals: Week 4:  OT Short Term Goal 1 (Week 4): continue working towards [employer]  Skilled Therapeutic Interventions/Progress Updates:  Pt greeted supine in bed reporting having just received her breakfast tray, pt worked on self feeding with set- up assist with built up utensil on RUE. Completed necessary DC requirements as indicated below while pt completed self feeding.  Pt reports feeling more confused today feeling like "things are happening around her." MD confirms confusion related to meds. Wrote pts therapy schedule up on board in front of her and issued pt a timer for staff to set in between therapies to assist with pt orienting herself in between therapies ( alerted rest of team to assist with compensatory method. Spent a lot of time discussing orientation strategies and ways to decrease confusion at home.  Pt reports urgency to void urine, therefore set- up female urinal with total A however pt unable to void, assisted pt to sitting with MAX A +1. Pt initially requires up to MOD A for static sitting balance but progressed to CGA with + time and effort with MOD cues for hand placement.  Pt completed lateral scoot transfer from [facility] with MODA. Pt needs MAX A +2 for all aspects of toileting as pt needs one person to stand with her ( MAX A +1) and one person to assist with clothing mgmt. Pt with + urine void. Pt needed MOD A +1 to scoot back to EOB from DABSC to R side.  Pt needed MOD A +1 for sit>supine needing most assist to elevate BLEs with HOB elevated. Pt left supine in bed with bed alarm set and all needs within reach.      Therapy Documentation Precautions:  Precautions Precautions: Fall, Other (comment) Precaution Comments: pt has personal L LE  AFO Restrictions Weight Bearing Restrictions: No PAIN: Pt reports 1/10 pain when supine which progresses to 2.5/10 with movement, rest breaks, deep breathing, distraction, and repositioning all utilized as pain mgmt strategies.   ADL: ADL Eating: Set up Where Assessed-Eating: Bed level Grooming: Supervision/safety Where Assessed-Grooming: Bed level Upper Body Bathing: Moderate assistance Where Assessed-Upper Body Bathing: Sitting at sink Lower Body Bathing: Maximal assistance Where Assessed-Lower Body Bathing: Sitting at sink Upper Body Dressing: Moderate assistance Where Assessed-Upper Body Dressing: Chair Lower Body Dressing: Maximal assistance, Other (Comment) (+2 sit>stand to pull pants to waist line) Where Assessed-Lower Body Dressing: Chair Toileting: Maximal assistance, Other (Comment) (+2 for hygiene d/t pain) Where Assessed-Toileting: Bedside Commode Toilet Transfer: Moderate assistance Toilet Transfer Method: Stand pivot, Other (comment) (lateral scoot transfer to [facility]) Toilet Transfer Equipment: Bedside commode, Other (comment) (drop arm) Tub/Shower Transfer: Not assessed Social research officer, government: Unable to assess ADL Comments: pt requires MOD - MAX A +2 at times for ADLS d/t worsening back pain that has limited ADL progression during last couple of weeks Vision Baseline Vision/History: 1 Wears glasses Patient Visual Report: No change from baseline (pt reports intermittent "foggy" vision) Perception  Perception: Impaired Praxis Praxis: Impaired Praxis Impairment Details: Motor planning   Therapy/Group: Individual Therapy  Corinne Ports [facility] 05/26/2022, 10:31 AM

## 2022-05-26 NOTE — Progress Notes (Signed)
Occupational Therapy Discharge Summary  Patient Details  Name: [name] MRN: [id number] Date of Birth: [birth year]  Patient has met 5 of 9 long term goals due to improved activity tolerance, improved balance, ability to compensate for deficits, functional use of  LEFT upper and LEFT lower extremity, and improved awareness.  Patient to discharge at [facility] Max Assist level.  Patient's care partner is unable to provide max A, therefore pt dc to SNF for continued rehabilitation.   Reasons goals not met:  Patient was greatly limited during her rehab stay due to back pain and anxiety with movement.  Pt still needs min A at times for sitting balance Mod A sit<>stand Max A for bathing Max A for toilet transfers  Recommendation:  Patient will benefit from ongoing skilled OT services in skilled nursing facility setting to continue to advance functional skills in the area of BADL and Reduce care partner burden.  Equipment: No equipment provided  Reasons for discharge: treatment goals met and discharge from hospital  Patient/family agrees with progress made and goals achieved: Yes  OT Discharge Precautions/Restrictions  Precautions Precautions: Fall;Other (comment) Precaution Comments: pt has personal L LE AFO, LSO when OOB for pt comfort Restrictions Weight Bearing Restrictions: No Pain  High back pain ADL ADL Eating: Set up Where Assessed-Eating: Bed level Grooming: Supervision/safety, Setup Where Assessed-Grooming: Sitting at sink (recliner) Upper Body Bathing: Minimal assistance Where Assessed-Upper Body Bathing: Sitting at sink Lower Body Bathing: Maximal assistance Where Assessed-Lower Body Bathing: Sitting at sink Upper Body Dressing: Minimal assistance Where Assessed-Upper Body Dressing: Chair Lower Body Dressing: Maximal assistance, Other (Comment) Where Assessed-Lower Body Dressing: Chair Toileting: Maximal assistance Where Assessed-Toileting: Bedside  Commode Toilet Transfer: Maximal assistance Toilet Transfer Method: Stand pivot, Other (comment) (lateral scoot transfer to [facility]) Toilet Transfer Equipment: Bedside commode, Other (comment) (drop arm) Tub/Shower Transfer: Not assessed Social research officer, government: Unable to assess ADL Comments: pt requires MOD - MAX A +2 at times for ADLS d/t worsening back pain that has limited ADL progression during last couple of weeks Vision Baseline Vision/History: 1 Wears glasses Patient Visual Report: No change from baseline Vision Assessment?: No apparent visual deficits Eye Alignment: Within Functional Limits Ocular Range of Motion: Within Functional Limits Alignment/Gaze Preference: Within Defined Limits Tracking/Visual Pursuits: Decreased smoothness of horizontal tracking Saccades: Within functional limits Convergence: Within functional limits Visual Fields: No apparent deficits Perception  Perception: Within Functional Limits Body Part Identification: impaired R/L awareness with only tactile stimulus Spatial Orientation: difficulty knowing when lined up with seat although with increased time able to correctly do this during session Praxis Praxis: Impaired Praxis Impairment Details: Motor planning Cognition Cognition Overall Cognitive Status: History of cognitive impairments - at baseline Arousal/Alertness: Awake/alert Orientation Level: Person;Place;Situation Person: Oriented Place: Oriented Situation: Oriented Memory: Impaired Memory Impairment: Decreased recall of new information;Decreased short term memory;Retrieval deficit Attention: Selective;Sustained Focused Attention: Appears intact Sustained Attention: Appears intact Selective Attention: Impaired Selective Attention Impairment: Verbal complex;Functional complex Awareness: Impaired Awareness Impairment: Emergent impairment;Anticipatory impairment Problem Solving: Impaired Problem Solving Impairment: Functional  complex Behaviors: Perseveration;Lability Safety/Judgment: Appears intact Brief Interview for Mental Status (BIMS) Repetition of Three Words (First Attempt): 3 Temporal Orientation: Year: Correct Temporal Orientation: Month: Accurate within 5 days Temporal Orientation: Day: Correct Recall: "Sock": Yes, no cue required Recall: "Blue": Yes, no cue required Recall: "Bed": Yes, after cueing ("a piece of furniture") BIMS Summary Score: 14 Sensation Sensation Light Touch: Appears Intact Hot/Cold: Not tested Proprioception: Impaired by gross assessment Stereognosis: Not tested Coordination Gross Motor  Movements are Fluid and Coordinated: No Fine Motor Movements are Fluid and Coordinated: No Coordination and Movement Description: Impaired related to L hemi and decreased coordination/balance. Also affected by back pain Motor  Motor Motor: Hemiplegia Motor - Skilled Clinical Observations: mild L hemiparesis Motor - Discharge Observations: mild L hemiparesis. Back pain of unclear etiology Mobility  Bed Mobility Bed Mobility: Supine to Sit;Sit to Supine Supine to Sit: Maximal Assistance - Patient - Patient 25-49% Sit to Supine: Maximal Assistance - Patient 25-49% Transfers Sit to Stand: Minimal Assistance - Patient > 75% Stand to Sit: Moderate Assistance - Patient 50-74%  Trunk/Postural Assessment  Cervical Assessment Cervical Assessment: Exceptions to [facility] (forward head) Thoracic Assessment Thoracic Assessment: Exceptions to [facility] (rounded shoulders) Lumbar Assessment Lumbar Assessment: Exceptions to [facility] (posterior pelvic tilt and sacral sitting) Postural Control Postural Control: Deficits on evaluation Righting Reactions: delayed and inadequate Postural Limitations: decreased  Balance Balance Balance Assessed: Yes Static Sitting Balance Static Sitting - Balance Support: Feet supported Static Sitting - Level of Assistance: 5: Stand by assistance Dynamic Sitting Balance Dynamic  Sitting - Balance Support: Feet supported Dynamic Sitting - Level of Assistance: 4: Min assist Static Standing Balance Static Standing - Balance Support: Bilateral upper extremity supported Static Standing - Level of Assistance: 4: Min assist Dynamic Standing Balance Dynamic Standing - Balance Support: During functional activity Dynamic Standing - Level of Assistance: 3: Mod assist;2: Max assist Extremity/Trunk Assessment RUE Assessment RUE Assessment: Within Functional Limits LUE Assessment LUE Assessment: Exceptions to [facility] General Strength Comments: 3+/5 LUE Body System: Neuro Brunstrum levels for arm and hand: Arm;Hand   Daneen Schick Pixie Burgener 05/31/2022, 3:57 PM

## 2022-05-26 NOTE — Progress Notes (Signed)
Speech Language Pathology Daily Session Note  Patient Details  Name: [name] MRN: [id number] Date of Birth: 1946/09/04  Today's Date: 05/26/2022 SLP Individual Time: 2162-4469 SLP Individual Time Calculation (min): 43 min  Short Term Goals: Week 4: SLP Short Term Goal 1 (Week 4): STG=LTG due to ELOS  Skilled Therapeutic Interventions: Skilled ST treatment focused on cognitive goals. Pt was semi reclined in bed on arrival and agreeable to tx. Pt reported sensation to have BM. Pt was able to roll L with min A for bedpan placement with minimal discomfort reported/demonstrated. Pt did not have BM however voided bladder. Peri care provided. SLP facilitated additional education on memory strategies using external aids, and carried over OT's efforts with visual therapy schedule and use of alarm clock to manage time between sessions with overall min A for use. Pt reported feeling increasingly "foggy" today and perseverated on this throughout session requiring sup A verbal redirection to maintain attention to task. Patient was left in bed with alarm activated and immediate needs within reach at end of session. Continue per current plan of care.      Pain  None/denied at rest  Therapy/Group: Individual Therapy  Patty Sermons 05/26/2022, 1:03 PM

## 2022-05-26 NOTE — Patient Care Conference (Signed)
Inpatient RehabilitationTeam Conference and Plan of Care Update Date: 05/26/2022   Time: 10:34 AM    Patient Name: [name]      Medical Record Number: [id number]  Date of Birth: 09-26-1946 Sex: Female         Room/Bed: 4M03C/4M03C-01 Payor Info: Payor: MEDICARE / Plan: MEDICARE PART A AND B / Product Type: *No Product type* /    Admit Date/Time:  04/30/2022  4:11 PM  Primary Diagnosis:  SAH (subarachnoid hemorrhage) [facility])  Hospital Problems: Principal Problem:   SAH (subarachnoid hemorrhage) (HCC) Active Problems:   Subarachnoid hemorrhage ([place])    Expected Discharge Date: Expected Discharge Date:  (SNF pending)  Team Members Present: Physician leading conference: Dr. Alysia Penna Social Worker Present: Erlene Quan, BSW Nurse Present: Dorien Chihuahua, RN PT Present: Page Spiro, PT OT Present: Precious Haws, COTA;Jennifer [place], OT SLP Present: Weston Anna, SLP PPS Coordinator present : Gunnar Fusi, SLP     Current Status/Progress Goal Weekly Team Focus  Bowel/Bladder   Continent/ and incontinent of Bladder/bowel, LBM 05/25/22  Restore continence  Assess toileting needs and assist, Time toileting Q2 hrs and prn   Swallow/Nutrition/ Hydration             ADL's   continues to be limited by pain, able to progress OOB with MIN- MOD A +2 to take steps to recliner, MOD A for UB dressing, MAX A +2 for LB dressing, ,lateral scoot transfer to [facility] with MOD A +1, MAX A +2 for 3/3 toileting tasks  downgraded to min ADL/transfers, mod LBD  BADL Reeducation, functional mobility to tolerance, nonpharm pain management   Mobility   pt continues to have increased low back pain limiting ability to participate in OOB mobility - she is performing rolling R/L using bedrails with min/mod assist, sit<>supine with max assist, sit<>stands with B HHA and max assist, stand pivot tranfers with B HHA and max assist - she has yet to be able to progress back to gait training   LTGs downgraded to mod assist overall due to continued high pain levels and decline in mobility  pain management, activity tolerance, pt/family education, bed mobility training, transfer training, sitting tolerance and balance, standing tolerance and balance   Communication   mod I-to-sup A  sup A  high level word finding, cont to address cogntive goals including attention and memory to aid overall communication   Safety/Cognition/ Behavioral Observations  min-to-mod A - continues to fluctuate and likely influenced by external factors including pain, anxiety  min A  problem solving, recall with use of compensatory strategies, orientation with aids, sustained attention   Pain   Patient c/o lower back and LLE pain with spasms and radiating discomfort , 6-8/10 on pain scale at time, Nucynta '100mg'$  q6hrs and prn Tylenol Q4 hrs  < 3/10  Assess QS and PRN, K-PAD to back prn   Skin   Skin intact  Mainain skin integrity  Continue to assess QS/PRN with f/u per protocol     Discharge Planning:  Patient ready for d/c on Thursday   Team Discussion: Patient continues with pain in back; CT notes end plate fracture. Confusion due to meds; upsets the patient, frustrated that she needs help. Now presents with posterior lean and poor attention, left knee buckling compounded by poor activity tolerance, and memory issues, poor recall, inattention,  and anxiety. Patient on target to meet rehab goals: Currently needs max asist + 2 for lower body dressing and mod assist for standing  to step in prep for ambulation. Mo d assist for upper body dressing. Stand pivots with max assist.   *See Care Plan and progress notes for long and short-term goals.   Revisions to Treatment Plan:  Downgraded goals for OT/PT to mod assist   Teaching Needs: Safety, cues. Medications, transfers, toileting,etc.   Current Barriers to Discharge: Lack of/limited family support; husband unable to provide physical assistance  Possible  Resolutions to Barriers: SNF recommended     Medical Summary Current Status: constipation improved, pain limitations persist but cognition declined, PT/INR at goal  Barriers to Discharge: Medical stability   Possible Resolutions to Barriers/Weekly Focus: cont bowel program, cont to balance pain management with cognitive side effects   Continued Need for Acute Rehabilitation Level of Care: The patient requires daily medical management by a physician with specialized training in physical medicine and rehabilitation for the following reasons: Direction of a multidisciplinary physical rehabilitation program to maximize functional independence : Yes Medical management of patient stability for increased activity during participation in an intensive rehabilitation regime.: Yes Analysis of laboratory values and/or radiology reports with any subsequent need for medication adjustment and/or medical intervention. : Yes   I attest that I was present, lead the team conference, and concur with the assessment and plan of the team.   Dorien Chihuahua B 05/26/2022, 5:07 PM

## 2022-05-26 NOTE — Progress Notes (Signed)
Physical Therapy Session Note  Patient Details  Name: [name] MRN: [id number] Date of Birth: 08-19-1946  Today's Date: 05/26/2022 PT Individual Time: 1320-1433 PT Individual Time Calculation (min): 73 min   Short Term Goals: Week 4:  PT Short Term Goal 1 (Week 4): = to LTGs based on ELOS  Skilled Therapeutic Interventions/Progress Updates:    Pt received supine in bed with her husband, [name], present and pt agreeable to therapy session. Discussed their plan to go to a transitional care facilitate with SNF option. Supine>sitting R EOB, HOB elevated to 30degrees and using bedrail, with only mod assist! for L LE management and trunk upright - cuing throughout for breathing to manage pain, no severe pain noted. Sitting EOB with min assist quickly progressed to supervision. Donned LSO for OOB mobility. Mod assist for scooting hips towards EOB with pt demonstrating improved ability to initiate this with decreased pain.   Pt still grimaces her face with pain but is able to be redirected with calming, relaxing, distraction, and breathing cuing.   Sit>stand EOB>B HHA on therapist's shoulders with heavy mod assist today (improved from yesterday). L stand pivot EOB>BSC with mod assist today with pt demonstrating decreased posterior lean and no instances of L knee buckling - therapist still gently providing weight shift to initiate a step during the transfer. Continent of bowels. Sit>stand BSC>B HHA on therapist's shoulders (pt pushes up with R hand from armrest with improved anterior trunk lean and is able to transition into standing with R HHA) - standing with mod assist while +2 provided dependent peri-care - continues to have gradually worsening L posterior lean in standing.   Sitting on BSC donned pt's personal TED hose, shoes, L LE AFO, and pants total assist for time management.  L stand pivot BSC>W/C with mod assist for balance and therapist facilitating weight shift to take a step - continues  to have L posterior lean.    Transported to/from gym in w/c for time management and energy conservation.  Therapist provided pt with back cushion in w/c because noticed every time she went to lean forward to initiate a transfer out of the wheelchair this would cause increased back pain (anticipate it is due to being in such a reclined/posterior pelvic tilt when sitting back in the w/c).   Sit>stand x2 from w/c pushing up with R hand on R hallway rail with mirror feedback and mod assist for lifting to stand and balance - improving ability to flex trunk forward to initiate coming to stand - continues to have L posterior lean in standing although much improved compared to yesterday.  Progressed to gait training 49f at R hallway rail with mod assist of 1 primarily for balance due to L posterior lean and +2 w/c follow - pt able to advance LEs without assist and no signs of knee buckling; however, does report pain every time going to lift L LE to advance it during swing (therapist cuing for increased R weight shift onto R stance to increase ease of this movement with possible slight improvement).   Pt reporting increased back ache "in the middle" of her back after ambulating due to increased mobility.  Transported pt back to room. R stand pivot transfer w/c>EOB with pt pushing up with R hand from w/c armrest and then transitioning to B UE support on therapist's shoulders once in standing with mod assist for lifting and balance while turning.  Sit>supine, HOB elevated to 30degrees and using R bedrail, with mod assist  for B LE management into bed and NO pain noted or reported during this!  Pt left supine in bed with needs in reach, bed alarm on, and her husband and friend present.  Therapy Documentation Precautions:  Precautions Precautions: Fall, Other (comment) Precaution Comments: pt has personal L LE AFO Restrictions Weight Bearing Restrictions: No   Pain:  Please see details above - overall  pt with less pain today during mobility tasks allowing progression to very short gait distance (75f) at R hallway rail. Wearing LSO with OOB mobility.   Therapy/Group: Individual Therapy  CTawana Scale, PT, DPT, NCS, CSRS 05/26/2022, 1:11 PM

## 2022-05-26 NOTE — Progress Notes (Signed)
PROGRESS NOTE   Subjective/Complaints:  BM yesterday, back pain a little better  ROS: Pt denies SOB, abd pain, CP, and vision changes + constipation  Objective:   No results found. Recent Labs    05/24/22 0541  WBC 7.9  HGB 11.5*  HCT 36.2  PLT 242      Recent Labs    05/24/22 0541  NA 136  K 3.6  CL 99  CO2 27  GLUCOSE 117*  BUN 10  CREATININE 0.62  CALCIUM 9.2      Intake/Output Summary (Last 24 hours) at 05/26/2022 0804 Last data filed at 05/26/2022 0600 Gross per 24 hour  Intake 777 ml  Output 200 ml  Net 577 ml         Physical Exam: Vital Signs Blood pressure 114/72, pulse 89, temperature 98.2 F (36.8 C), temperature source Oral, resp. rate 17, height $RemoveBe'5\' 7"'WRsViVJDh$  (1.702 m), weight 55 kg, SpO2 95 %.   General: No acute distress, sitting in bed Mood and affect are appropriate Heart: Regular rate and rhythm no rubs murmurs or extra sounds Lungs: Clear to auscultation, breathing unlabored, no rales or wheezes, good air movement Abdomen: Positive bowel sounds, soft nontender to palpation, nondistended Extremities: No clubbing, cyanosis, or edema Skin: No evidence of breakdown, no evidence of rash   Neuro: CN II-XII intact, motor strength 4/5 bilateral deltoid, bicep, triceps, grip, hip flexor, knee extensors, ankle dorsiflexors/plantar flexors.  Sensory exam normal sensation to light touch lower extremities.  Speech with minimal dysarthria, sentence level, occasional word finding, but appears at baseline. Musculoskeletal: ,Back spasm below waist when extending LLE  Sensation intact LT Bilateral L3-4-5 S1 dermatomal distribution   Assessment/Plan: 1. Functional deficits which require 3+ hours per day of interdisciplinary therapy in a comprehensive inpatient rehab setting. Physiatrist is providing close team supervision and 24 hour management of active medical problems listed below. Physiatrist  and rehab team continue to assess barriers to discharge/monitor patient progress toward functional and medical goals  Care Tool:  Bathing    Body parts bathed by patient: Front perineal area, Abdomen, Chest, Left arm, Right arm, Face, Left upper leg, Right upper leg   Body parts bathed by helper: Buttocks     Bathing assist Assist Level: Moderate Assistance - Patient 50 - 74%     Upper Body Dressing/Undressing Upper body dressing   What is the patient wearing?: Pull over shirt    Upper body assist Assist Level: Moderate Assistance - Patient 50 - 74%    Lower Body Dressing/Undressing Lower body dressing      What is the patient wearing?: Incontinence brief, Pants     Lower body assist Assist for lower body dressing: Maximal Assistance - Patient 25 - 49%     Toileting Toileting Toileting Activity did not occur (Clothing management and hygiene only): N/A (no void or bm)  Toileting assist Assist for toileting: 2 Helpers (MAX A +2)     Transfers Chair/bed transfer  Transfers assist     Chair/bed transfer assist level: Maximal Assistance - Patient 25 - 49% (stand pivot) Chair/bed transfer assistive device: Programmer, multimedia   Ambulation assist   Ambulation activity  did not occur: Safety/medical concerns (pain)  Assist level: Contact Guard/Touching assist Assistive device: Cane-quad Max distance: 249ft   Walk 10 feet activity   Assist     Assist level: Contact Guard/Touching assist Assistive device: Cane-quad   Walk 50 feet activity   Assist    Assist level: Contact Guard/Touching assist Assistive device: Cane-quad    Walk 150 feet activity   Assist Walk 150 feet activity did not occur: Safety/medical concerns         Walk 10 feet on uneven surface  activity   Assist     Assist level: Contact Guard/Touching assist Assistive device: Cane-quad   Wheelchair     Assist Is the patient using a wheelchair?: No (only for  time management due to slow gait speed)             Wheelchair 50 feet with 2 turns activity    Assist            Wheelchair 150 feet activity     Assist          Blood pressure 114/72, pulse 89, temperature 98.2 F (36.8 C), temperature source Oral, resp. rate 17, height $RemoveBe'5\' 7"'LukOdCmqg$  (1.702 m), weight 55 kg, SpO2 95 %.  Medical Problem List and Plan: 1. 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 Formulized individual treatment plan based on medical history, underlying problem and comorbidities.             Con't CIR- PT, OT and SLP 2.  Antithrombotics: -DVT/anticoagulation: Coumadin resumed 04/29/2022 with bridging of Lovenox.  INR goal 2.5-3.5, INR still low , INR 2.5   3. Pain: lumbar scoliosis and stenosis , myofascial component ,   Lumbar xray no new changes vs MRI 5/23, although a small vertebral compression fracture may cause exacerbation of sx,  no MRI ordered since pt would need to be sedated with IV and no change in treatment would be necessary  CT scan reviewed looks like MRI from 03/2022.  Could still have end plate stress rxn but no change in treatment needed. May trial lumbar corset for comfort Now on Nucynta  $Remove'75mg'edFHUrn$  QID,increase to $RemoveBeforeD'100mg'jffzfMmaMwivCz$  an dmonitor for mental status changes  4. Mood/Sleep: Provide emotional support             -antipsychotic agents: N/A 5. Neuropsych/cognition: This patient is capable of making decisions on her own behalf. 6. Skin/Wound Care: Routine skin checks 7. Fluids/Electrolytes/Nutrition: Routine in and outs with follow-up chemistries 8.  History of mitral valve replacement 1997.  Resuming Coumadin with bridging of Lovenox  04/29/2022.  Follow-up per cardiology services as needed -7/6 INR above goal, coumadin held today, monitor for bleeding 7/7 pharmacy adjusting coumadin dose 9.  Hyperlipidemia.  Continue Lipitor 10.  Hypertension.  Continue Norvasc 5 mg daily Vitals:   05/25/22 1928 05/26/22 0613  BP: (!) 99/57 114/72  Pulse: 96 89  Resp: 18 17  Temp: 98.2 F (36.8 C) 98.2 F (36.8 C)  SpO2: 95% 95%   7/18 Well controlled 11.  Prediabetes.  Hemoglobin A1c 6.1.  Diet controlled/SSI discontinued 12.  History of lumbar spinal stenosis  with  increased radicular pain , would not rec reschedule  ESI due to anticoag issues -would not push distances for ambulation  13.  GERD.  Protonix 14. Insomnia She plans to find out what medication she takes at home for consideration of use at the hospital 15. Hypokalemia  -KCL 29meq x2 , recheck     Latest Ref Rng & Units 05/24/2022    5:41 AM 05/04/2022    6:10 AM 05/03/2022    6:16 AM  BMP  Glucose 70 - 99 mg/dL 117  122  118   BUN 8 - 23 mg/dL $Remove'10  20  19   'dSDVLOy$ Creatinine 0.44 - 1.00 mg/dL 0.62  0.87  0.73   Sodium 135 - 145 mmol/L 136  142  138   Potassium 3.5 - 5.1 mmol/L 3.6  4.1  3.4   Chloride 98 - 111 mmol/L 99  106  105   CO2 22 - 32 mmol/L $RemoveB'27  29  28   'WRleRVGS$ Calcium 8.9 - 10.3 mg/dL 9.2  9.7  9.1    7/2 K+ at 4.1 (6/27), trend with weekly labs BMP normal 7/17 16. Anemia mild  -Continue to monitor    Latest Ref Rng & Units 05/24/2022    5:41 AM 05/13/2022    5:23 AM 05/03/2022    6:16 AM  CBC  WBC 4.0 - 10.5 K/uL 7.9  6.4  4.4   Hemoglobin 12.0 - 15.0 g/dL 11.5  11.0  11.4   Hematocrit 36.0 - 46.0 % 36.2  34.9  35.8   Platelets 150 - 400 K/uL 242  244  149    continue to follow with weekly labs Hgb on low side but stable 7/17 17. Constipation  -She was given $RemoveB'30mg'GzTiKCdg$  sorbitol today, continue  senokot  -senna 3 tabs BID    LOS: 26 days A FACE TO FACE EVALUATION WAS PERFORMED  Charlett Blake 05/26/2022, 8:04 AM

## 2022-05-26 NOTE — Progress Notes (Signed)
Patient ID: [name], female   DOB: 1946/08/25, 76 y.o.   MRN: [id number] Team Conference Report to Patient/Family  Team Conference discussion was reviewed with the patient and caregiver, including goals, any changes in plan of care and target discharge date.  Patient and caregiver express understanding and are in agreement.  The patient has a target discharge date of  (SNF pending).  Sw met with patient and spouse and provided team conference updates. Patient spouse has expressed his want for patient to d//c to [facility]. Patient and spouse have a preference of [place] and patient spouse plans to meet with the facility tomorrow to discuss admission. Sw will continue to follow up with patient and spouse.   [name] 05/26/2022, 1:33 PM

## 2022-05-26 NOTE — Progress Notes (Addendum)
ANTICOAGULATION CONSULT NOTE- follow-up  Pharmacy Consult for Warfarin Indication:  mechanical mitral valve  Allergies  Allergen Reactions   Zoloft [Sertraline] Hives, Itching and Rash    Patient Measurements: Height: '5\' 7"'$  (170.2 cm) Weight: 55 kg (121 lb 4.1 oz) IBW/kg (Calculated) : 61.6   Vital Signs: Temp: 98.2 F (36.8 C) (07/19 0613) Temp Source: Oral (07/19 0613) BP: 114/72 (07/19 0488) Pulse Rate: 89 (07/19 0613)  Labs: Recent Labs    05/24/22 0541 05/25/22 0527 05/26/22 0543  HGB 11.5*  --   --   HCT 36.2  --   --   PLT 242  --   --   LABPROT 38.9* 34.2* 30.8*  INR 4.0* 3.4* 3.0*  CREATININE 0.62  --   --      Estimated Creatinine Clearance: 51.9 mL/min (by C-G formula based on SCr of 0.62 mg/dL).   Assessment: 76 YO female with medical history significant for mMVR in 1997 on warfarin PTA.  Patient presented to the ED with confusion and slurred speech found to have acute SAH. Lovenox / warfarin from PTA was then held on admission and resumed 6/22. Lovenox bridge completed. Pharmacy consulted to manage warfarin during rehab admission.   INR therapeutic at 3. No signs of bleeding. Warfarin 1 mg given 7/17 due to elevated INR of 4. Total weekly dose reduced by ~10% 7/18.   Goal of Therapy:  INR 2.5-3.5 Monitor platelets by anticoagulation protocol: Yes   Plan:  Continue reduced warfarin regimen of 4.5 mg PO daily except 2 mg PO Wed/Sun INR MWF Continue to monitor H&H and platelets  Thank you for involving pharmacy in this patient's care.  Renold Genta, PharmD, BCPS Clinical Pharmacist Clinical phone for 05/26/2022 until 3p is [phone] 05/26/2022 9:35 AM

## 2022-05-26 NOTE — NC FL2 (Signed)
[place] LEVEL OF CARE SCREENING TOOL     IDENTIFICATION  Patient Name: [name] Birthdate: 11-10-1945 Sex: female Admission Date (Current Location): 04/30/2022  [facility] and [place] Number:  Herbalist and Address:  [facility] [place], [place] [address], [place], [place] [address]      Provider Number:    Attending Physician Name and Address:  Charlett Blake, MD  Relative Name and Phone Number:  Reeanna Acri [phone]    Current Level of Care: Hospital Recommended Level of Care: [place] Prior Approval Number:    Date Approved/Denied:   PASRR Number: [id number]  Discharge Plan: SNF    Current Diagnoses: Patient Active Problem List   Diagnosis Date Noted   Subarachnoid hemorrhage ([place]) 04/30/2022   SAH (subarachnoid hemorrhage) ([place]) 04/26/2022   Goals of care, counseling/discussion    Palliative care by specialist    Acute CVA (cerebrovascular accident) ([place]) 07/01/2020   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   Anticoagulated on warfarin    Left hemiparesis ([place])    History of CVA with residual deficit    Thrombocytopenia ([place])    Prediabetes    Vertigo 09/24/2017   Benign paroxysmal positional vertigo    History of stroke    HLD (hyperlipidemia) 08/23/2017   Encounter for therapeutic drug monitoring 08/08/2017   Long term (current) use of anticoagulants [Z79.01] 07/12/2017   Hemiparesis and alteration of sensations as late effects of stroke ([place]) 09/07/2016   Abnormality of gait 09/07/2016   HTN (hypertension) 04/13/2016   H/O mitral valve replacement with mechanical valve 10/16/2015   Diabetes mellitus without complication (HCC)    Hemiparesis (L sided - mild) due to old stroke ([place])     Orientation RESPIRATION BLADDER Height & Weight     Self, Time, Place, Situation  Normal Continent Weight: 121 lb 4.1 oz (55 kg) Height:  '5\' 7"'$  (170.2 cm)  BEHAVIORAL SYMPTOMS/MOOD NEUROLOGICAL  BOWEL NUTRITION STATUS      Continent Diet  AMBULATORY STATUS COMMUNICATION OF NEEDS Skin   Limited Assist   Normal                       Personal Care Assistance Level of Assistance  Dressing, Feeding, Bathing Bathing Assistance: Limited assistance Feeding assistance: Limited assistance Dressing Assistance: Limited assistance     Functional Limitations Info             SPECIAL CARE FACTORS FREQUENCY  PT (By licensed PT), OT (By licensed OT), Speech therapy     PT Frequency: 5x a week OT Frequency: 5x a week     Speech Therapy Frequency: 5x a week      Contractures      Additional Factors Info                  Current Medications (05/26/2022):  This is the current hospital active medication list Current Facility-Administered Medications  Medication Dose Route Frequency Provider Last Rate Last Admin   acetaminophen (TYLENOL) tablet 325-650 mg  325-650 mg Oral Q4H PRN Cathlyn Parsons, PA-C   650 mg at 05/25/22 0756   atorvastatin (LIPITOR) tablet 40 mg  40 mg Oral Daily Cathlyn Parsons, PA-C   40 mg at 05/26/22 6962   bisacodyl (DULCOLAX) suppository 10 mg  10 mg Rectal Daily PRN Charlett Blake, MD   10 mg at 05/11/22 1008   methocarbamol (ROBAXIN) tablet 500 mg  500 mg Oral  TID Charlett Blake, MD   500 mg at 05/26/22 1302   Muscle Rub CREA   Topical BID PRN Kirsteins, Luanna Salk, MD   Given at 05/25/22 0756   ondansetron (ZOFRAN) tablet 4 mg  4 mg Oral Q8H PRN Cathlyn Parsons, PA-C   4 mg at 05/25/22 0756   pantoprazole (PROTONIX) EC tablet 40 mg  40 mg Oral Daily Cathlyn Parsons, PA-C   40 mg at 05/26/22 9935   polyvinyl alcohol (LIQUIFILM TEARS) 1.4 % ophthalmic solution 1 drop  1 drop Both Eyes BID PRN Angiulli, Lavon Paganini, PA-C       senna-docusate (Senokot-S) tablet 3 tablet  3 tablet Oral BID Charlett Blake, MD   3 tablet at 05/26/22 0751   sorbitol 70 % solution 30 mL  30 mL Oral Daily PRN Luetta Nutting, FNP   30 mL at 05/23/22 7017    tapentadol (NUCYNTA) tablet 100 mg  100 mg Oral Q6H Kirsteins, Luanna Salk, MD   100 mg at 05/26/22 1302   warfarin (COUMADIN) tablet 4.5 mg  4.5 mg Oral Once per day on Mon Tue Thu Fri Sat Francena Hanly, RPH   4.5 mg at 05/25/22 7939   And   warfarin (COUMADIN) tablet 2 mg  2 mg Oral Once per day on Sun Wed Edwards, Caitlin, [facility]       Warfarin - Pharmacist Dosing Inpatient   Does not apply [id number] Cathlyn Parsons, PA-C   Given at 05/25/22 1621     Discharge Medications: Please see discharge summary for a list of discharge medications.  Relevant Imaging Results:  Relevant Lab Results:   Additional Information SS:[id number]  [name]

## 2022-05-26 NOTE — Plan of Care (Signed)
Problem: RH Ambulation Goal: LTG Patient will ambulate in home environment (PT) Description: LTG: Patient will ambulate in home environment, # of feet with assistance (PT). Outcome: Not Progressing Flowsheets (Taken 05/26/2022 1957) LTG: Pt will ambulate in home environ  assist needed:: (D/C as pt currently unable to tolerate functional household ambulation due to pain) -- Note: D/C as pt currently unable to tolerate functional household ambulation due to pain   Problem: RH Stairs Goal: LTG Patient will ambulate up and down stairs w/assist (PT) Description: LTG: Patient will ambulate up and down # of stairs with assistance (PT) Outcome: Not Met (add Reason) Flowsheets (Taken 05/26/2022 1957) LTG: Pt will ambulate up/down stairs assist needed:: (D/C as pt currently unable to tolerate functional ambulation nor stair navigation due to pain) -- Note: D/C as pt currently unable to tolerate functional ambulation nor stair navigation due to pain   Problem: RH Balance Goal: LTG Patient will maintain dynamic sitting balance (PT) Description: LTG:  Patient will maintain dynamic sitting balance with assistance during mobility activities (PT) Flowsheets (Taken 05/26/2022 1957) LTG: Pt will maintain dynamic sitting balance during mobility activities with:: (downgraded based on pt's progress) Contact Guard/Touching assist Note: downgraded based on pt's progress Goal: LTG Patient will maintain dynamic standing balance (PT) Description: LTG:  Patient will maintain dynamic standing balance with assistance during mobility activities (PT) Flowsheets (Taken 05/26/2022 1957) LTG: Pt will maintain dynamic standing balance during mobility activities with:: (downgraded based on pt's progress) Moderate Assistance - Patient 50 - 74% Note: downgraded based on pt's progress   Problem: Sit to Stand Goal: LTG:  Patient will perform sit to stand with assistance level (PT) Description: LTG:  Patient will perform sit to  stand with assistance level (PT) Flowsheets (Taken 05/26/2022 1957) LTG: PT will perform sit to stand in preparation for functional mobility with assistance level: (downgraded based on pt's progress) Moderate Assistance - Patient 50 - 74% Note: downgraded based on pt's progress   Problem: RH Bed Mobility Goal: LTG Patient will perform bed mobility with assist (PT) Description: LTG: Patient will perform bed mobility with assistance, with/without cues (PT). Flowsheets (Taken 05/26/2022 1957) LTG: Pt will perform bed mobility with assistance level of: (downgraded based on pt's progress) Moderate Assistance - Patient 50 - 74% Note: downgraded based on pt's progress   Problem: RH Bed to Chair Transfers Goal: LTG Patient will perform bed/chair transfers w/assist (PT) Description: LTG: Patient will perform bed to chair transfers with assistance (PT). Flowsheets (Taken 05/26/2022 1957) LTG: Pt will perform Bed to Chair Transfers with assistance level: (downgraded based on pt's progress) Moderate Assistance - Patient 50 - 74% Note: downgraded based on pt's progress   Problem: RH Car Transfers Goal: LTG Patient will perform car transfers with assist (PT) Description: LTG: Patient will perform car transfers with assistance (PT). Flowsheets (Taken 05/26/2022 1957) LTG: Pt will perform car transfers with assist:: (downgraded based on pt's progress) Moderate Assistance - Patient 50 - 74% Note: downgraded based on pt's progress   Problem: RH Ambulation Goal: LTG Patient will ambulate in controlled environment (PT) Description: LTG: Patient will ambulate in a controlled environment, # of feet with assistance (PT). Flowsheets (Taken 05/26/2022 1957) LTG: Pt will ambulate in controlled environ  assist needed:: (downgraded based on pt's progress) Moderate Assistance - Patient 50 - 74% LTG: Ambulation distance in controlled environment: 98ft using LRAD Note: downgraded based on pt's progress   Problem: RH  Wheelchair Mobility Goal: LTG Patient will propel w/c in controlled environment (PT)  Description: LTG: Patient will propel wheelchair in controlled environment, # of feet with assist (PT) Flowsheets (Taken 05/26/2022 2000) LTG: Pt will propel w/c in controlled environ  assist needed:: (wheelchair level goal added to allow more independent mobility as pt not able to tolerate functional ambulation at this time due to pain) Minimal Assistance - Patient > 75% LTG: Propel w/c distance in controlled environment: 22ft Note: wheelchair level goal added to allow more independent mobility as pt not able to tolerate functional ambulation at this time due to pain

## 2022-05-27 LAB — SARS CORONAVIRUS 2 BY RT PCR: SARS Coronavirus 2 by RT PCR: NEGATIVE

## 2022-05-27 NOTE — Progress Notes (Signed)
Speech Language Pathology Daily Session Note  Patient Details  Name: [name] MRN: [id number] Date of Birth: October 16, 1946  Today's Date: 05/27/2022 SLP Individual Time: 9163-8466 SLP Individual Time Calculation (min): 45 min  Short Term Goals: Week 4: SLP Short Term Goal 1 (Week 4): STG=LTG due to ELOS  Skilled Therapeutic Interventions: Skilled ST treatment focused on cognitive-communication. Pt was received upright in wheelchair on arrival. Pt referred to wall schedule to orient to date and daily therapy schedule with sup A verbal cues. Pt completed basic problem solving to determine how much time is between sessions with mod I and additional time.   SLP facilitated thought organization and anticipatory problem solving task with discussion on anticipated move to senior living community following additional rehab in SNF setting. Pt benefited from sup A verbal cues for organization and redirection due to repeating the same concepts on occasion with minimal awareness. Pt exhibited effective anticipation of needs and attention to detail. Pt reports feeling more "clear headed" and able to engage in more functional conversation with less difficulty today.   SLP encouraged fluids throughout session due to obvious dry mouth. Pt consumes without overt s/sx of aspiration.  Patient was left in wheelchair with alarm activated and immediate needs within reach at end of session. Continue per current plan of care.      Pain Pain Assessment Pain Scale: 0-10 Pain Score: 4  Pain Type: Acute pain Pain Location: Back  Therapy/Group: Individual Therapy  Patty Sermons 05/27/2022, 9:33 AM

## 2022-05-27 NOTE — Progress Notes (Signed)
PROGRESS NOTE   Subjective/Complaints:  Looks brighter , good day in PT yesterday   ROS: Pt denies SOB, abd pain, CP, and vision changes + constipation  Objective:   No results found. No results for input(s): "WBC", "HGB", "HCT", "PLT" in the last 72 hours.    No results for input(s): "NA", "K", "CL", "CO2", "GLUCOSE", "BUN", "CREATININE", "CALCIUM" in the last 72 hours.    Intake/Output Summary (Last 24 hours) at 05/27/2022 0801 Last data filed at 05/26/2022 1954 Gross per 24 hour  Intake 617 ml  Output 100 ml  Net 517 ml         Physical Exam: Vital Signs Blood pressure 121/72, pulse (!) 106, temperature 98.2 F (36.8 C), resp. rate 18, height '5\' 7"'$  (1.702 m), weight 55 kg, SpO2 96 %.   General: No acute distress, sitting in bed Mood and affect are appropriate Heart: Regular rate and rhythm no rubs murmurs or extra sounds Lungs: Clear to auscultation, breathing unlabored, no rales or wheezes, good air movement Abdomen: Positive bowel sounds, soft nontender to palpation, nondistended Extremities: No clubbing, cyanosis, or edema Skin: No evidence of breakdown, no evidence of rash   Neuro: CN II-XII intact, motor strength 4/5 bilateral deltoid, bicep, triceps, grip, hip flexor, knee extensors, ankle dorsiflexors/plantar flexors.  Sensory exam normal sensation to light touch lower extremities.  Speech with minimal dysarthria, sentence level, occasional word finding, but appears at baseline. Musculoskeletal: ,Back spasm below waist when extending LLE  Sensation intact LT Bilateral L3-4-5 S1 dermatomal distribution   Assessment/Plan: 1. Functional deficits which require 3+ hours per day of interdisciplinary therapy in a comprehensive inpatient rehab setting. Physiatrist is providing close team supervision and 24 hour management of active medical problems listed below. Physiatrist and rehab team continue to  assess barriers to discharge/monitor patient progress toward functional and medical goals  Care Tool:  Bathing    Body parts bathed by patient: Front perineal area, Abdomen, Chest, Left arm, Right arm, Face, Left upper leg, Right upper leg   Body parts bathed by helper: Buttocks     Bathing assist Assist Level: Moderate Assistance - Patient 50 - 74%     Upper Body Dressing/Undressing Upper body dressing   What is the patient wearing?: Pull over shirt    Upper body assist Assist Level: Moderate Assistance - Patient 50 - 74%    Lower Body Dressing/Undressing Lower body dressing      What is the patient wearing?: Incontinence brief, Pants     Lower body assist Assist for lower body dressing: Maximal Assistance - Patient 25 - 49%     Toileting Toileting Toileting Activity did not occur (Clothing management and hygiene only): N/A (no void or bm)  Toileting assist Assist for toileting: 2 Helpers (MAX A +2)     Transfers Chair/bed transfer  Transfers assist     Chair/bed transfer assist level: Moderate Assistance - Patient 50 - 74% (stand pivot) Chair/bed transfer assistive device: Programmer, multimedia   Ambulation assist   Ambulation activity did not occur: Safety/medical concerns (pain)  Assist level: 2 helpers (mod A of 1 and +2 w/c follow) Assistive device: Other (comment) (R  hallway rail) Max distance: 39f   Walk 10 feet activity   Assist     Assist level: Contact Guard/Touching assist Assistive device: Cane-quad   Walk 50 feet activity   Assist    Assist level: Contact Guard/Touching assist Assistive device: Cane-quad    Walk 150 feet activity   Assist Walk 150 feet activity did not occur: Safety/medical concerns         Walk 10 feet on uneven surface  activity   Assist     Assist level: Contact Guard/Touching assist Assistive device: Cane-quad   Wheelchair     Assist Is the patient using a wheelchair?: No  (only for time management due to slow gait speed)             Wheelchair 50 feet with 2 turns activity    Assist            Wheelchair 150 feet activity     Assist          Blood pressure 121/72, pulse (!) 106, temperature 98.2 F (36.8 C), resp. rate 18, height '5\' 7"'$  (1.702 m), weight 55 kg, SpO2 96 %.  Medical Problem List and Plan: 1. 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  Antithrombotics: -DVT/anticoagulation: Coumadin resumed 04/29/2022 with bridging of Lovenox.  INR goal 2.5-3.5, INR still low , INR 3   3. Pain: lumbar scoliosis and stenosis , myofascial component ,   Lumbar xray no new changes vs MRI 5/23, although a small vertebral compression fracture may cause exacerbation of sx,  no MRI ordered since pt would need to be sedated with IV and no change in treatment would be necessary  CT scan reviewed looks like MRI from 03/2022.  Could still have end plate stress rxn but no change in treatment needed. May trial lumbar corset for comfort Now on Nucynta   '100mg'$  QID andmonitor for mental status changes  4. Mood/Sleep: Provide emotional support             -antipsychotic agents: N/A 5. Neuropsych/cognition: This patient is capable of making decisions on her own behalf. 6. Skin/Wound Care: Routine skin checks 7. Fluids/Electrolytes/Nutrition: Routine in and outs with follow-up chemistries 8.  History of mitral valve replacement 1997.  Resuming Coumadin with bridging of Lovenox 04/29/2022.  Follow-up per cardiology services as needed -7/6 INR above goal, coumadin held today, monitor for bleeding 7/7 pharmacy adjusting coumadin dose 9.  Hyperlipidemia.  Continue Lipitor 10.  Hypertension.  Continue Norvasc 5 mg daily Vitals:    05/26/22 1549 05/26/22 1933  BP: 110/70 121/72  Pulse: 86 (!) 106  Resp: 18 18  Temp: 98.4 F (36.9 C) 98.2 F (36.8 C)  SpO2: 97% 96%   7/18 Well controlled 11.  Prediabetes.  Hemoglobin A1c 6.1.  Diet controlled/SSI discontinued 12.  History of lumbar spinal stenosis  with increased radicular pain , would not rec reschedule  ESI due to anticoag issues -would not push distances for ambulation  13.  GERD.  Protonix 14. Insomnia She plans to find out what medication she takes at home for consideration of use at the hospital 15.  Hypokalemia  -KCL 68mq x2 , recheck     Latest Ref Rng & Units 05/24/2022    5:41 AM 05/04/2022    6:10 AM 05/03/2022    6:16 AM  BMP  Glucose 70 - 99 mg/dL 117  122  118   BUN 8 - 23 mg/dL '10  20  19   '$ Creatinine 0.44 - 1.00 mg/dL 0.62  0.87  0.73   Sodium 135 - 145 mmol/L 136  142  138   Potassium 3.5 - 5.1 mmol/L 3.6  4.1  3.4   Chloride 98 - 111 mmol/L 99  106  105   CO2 22 - 32 mmol/L '27  29  28   '$ Calcium 8.9 - 10.3 mg/dL 9.2  9.7  9.1    7/2 K+ at 4.1 (6/27), trend with weekly labs BMP normal 7/17 16. Anemia mild  -Continue to monitor    Latest Ref Rng & Units 05/24/2022    5:41 AM 05/13/2022    5:23 AM 05/03/2022    6:16 AM  CBC  WBC 4.0 - 10.5 K/uL 7.9  6.4  4.4   Hemoglobin 12.0 - 15.0 g/dL 11.5  11.0  11.4   Hematocrit 36.0 - 46.0 % 36.2  34.9  35.8   Platelets 150 - 400 K/uL 242  244  149    continue to follow with weekly labs Hgb on low side but stable 7/17 17. Constipation  -She was given '30mg'$  sorbitol today, continue  senokot  -senna 3 tabs BID    LOS: 27 days A FACE TO FACE EVALUATION WAS PERFORMED  ACharlett Blake7/20/2023, 8:01 AM

## 2022-05-27 NOTE — Progress Notes (Signed)
Occupational Therapy Session Note  Patient Details  Name: [name] MRN: [id number] Date of Birth: 08/10/1946  Today's Date: 05/27/2022 OT Individual Time: 0800-0850 session 1 OT Individual Time Calculation (min): 50 min  Session 2: [id number]   Short Term Goals: Week 4:  OT Short Term Goal 1 (Week 4): continue working towards [employer]  Skilled Therapeutic Interventions/Progress Updates:  Session 1:  Pt greeted supine in bed agreeable to OT intervention. Spent time working on orientation as pt continues to be mildly doscoriented however pt was able to state correct date. Wrote up pt schedule on wall to provide visual orientation cue. Pt completed supine>sit with HOB elevated to 30* with MOD A. Pt completed lateral scoot transfer from EOB>DABSC to L side with MODA +1. Pt needs MAX A +2 for toileting as pt needs assist for sit>stand and for hygiene and clothing mgmt in standing, pt with + urine void, NT present and aware.  Pt needed MAXA +2  to don new pants and brief from [facility].  Pt completed stand pivot from [facility] to w/c with MOD A via stand pivot with face to face assist.  Once in w/c donned new shirt with MOD A d/t pain when leaning forward to pull shirt down, pt completed seated grooming tasks at sink with MIN A for set- up of paste and toothbrush. [name] with all needs within reach and heat applied to back.  Session 2: Pt greeted supine in bed with LPN present assisting pt with using female urinal, pt unable to void bladder via urinal therefore assisted pt with EOB with MAX A however pt reports  left  10/10 pain in back when attempting supine>sit needing to lay back down. Noted pt with incontinent episode during movement with pt needing MOD A to roll R<>L for pericare and MAX A to don new brief from supine. Pt required MAX A +2 to reposition in bed. Pt left supine in bed with HOB elevated to 30*, bed alarm activated and all needs within reach.   Therapy Documentation Precautions:   Precautions Precautions: Fall, Other (comment) Precaution Comments: pt has personal L LE AFO, LSO when OOB for pt comfort Restrictions Weight Bearing Restrictions: No General:   Vital Signs:  Pain: Session 1: pt reports 4.5/10 pain in  her back at start of session, pt was premedicated. Pain increased to 5/10 with mobility, heat, rest breaks, and repositioning utilized as pain mgmt strategies.   Session 2: 10/10 back pain with mobility, rest breaks, repositioning, deep breathing and relaxation provided as pain mgmt.   Therapy/Group: Individual Therapy  Precious Haws 05/27/2022, 12:11 PM

## 2022-05-27 NOTE — Progress Notes (Signed)
Physical Therapy Session Note  Patient Details  Name: CARIS CERVENY MRN: [id number] Date of Birth: [birth year]  Today's Date: 05/27/2022 PT Individual Time: 1035-1132 PT Individual Time Calculation (min): 57 min   Short Term Goals: Week 4:  PT Short Term Goal 1 (Week 4): = to LTGs based on ELOS  Skilled Therapeutic Interventions/Progress Updates:    Pt received sitting in w/c reporting increased "middle" low back pain from prolonged sitting (has been sitting ~1.5-2hrs) rated as 5.5/10 to start session. Pt agreeable to therapy session. Pt with significant pain when leaning forward in w/c to allow therapist to remove K-pad and place the LSO. LSO worn for OOB mobility during session. Pt also with pain when lifting BOTH LEs to don stockings and tennis shoes with L LE AFO while sitting in w/c.  Pt reports need to use bathroom.  Sit>stand from w/c, pushing up from armrest with R UE and then using B HH support on therapist's shoulders once in standing - requires mod assist to lift into standing and balance - continues to have progressively worsening L posterior lean with L knee flexed and possibly pushing with R LE extended.  R stand pivot w/c>BSC with mod assist - therapist gently providing manual facilitation to weight shift to facilitate a step - pt with moderate difficulty stepping back with L LE.  Pt continent of bladder - standing as described above, requires +2 to provide dependent LB clothing management and peri-care while therapist provides mod assist for balance due to gradually worsening L posterior lean.  Pt reports feeling like she is having "wild jerks" stating she thinks she may be having muscle spasms - nurse confirms pt has been premedicated with all available at this time.  R stand pivot transfer BSC>EOB using same technique as described above.  Sit>supine with therapist providing mod assist for B LE management into bed and pusing R UE support on bedrail for trunk descent with pt  having onset of pain during this with significant grimacing and tensing.   +2 dependent assist to scoot towards [place].  Only able to perform slow, gradual incremental 1 degree at a time [facility] elevation with breathing/relaxation strategies for pain management during the very gradual transition requiring ~84mnutes to get to 25 degrees. Pt with ~5x moments of jerking, grimacing, tensing up moments due to pain onset when elevating HOB despite using this slow technique  At end of session, pt left supine in bed with needs in reach and bed alarm on.  Therapy Documentation Precautions:  Precautions Precautions: Fall, Other (comment) Precaution Comments: pt has personal L LE AFO, LSO when OOB for pt comfort Restrictions Weight Bearing Restrictions: No   Pain:  See above.    Therapy/Group: Individual Therapy  CTawana Scale, PT, DPT, NCS, CSRS 05/27/2022, 7:58 AM

## 2022-05-27 NOTE — Plan of Care (Signed)
  Problem: RH Balance Goal: LTG: Patient will maintain dynamic sitting balance (OT) Description: LTG:  Patient will maintain dynamic sitting balance with assistance during activities of daily living (OT) Flowsheets (Taken 05/27/2022 1618) LTG: Pt will maintain dynamic sitting balance during ADLs with: (downgraded JLS) Supervision/Verbal cueing Goal: LTG Patient will maintain dynamic standing with ADLs (OT) Description: LTG:  Patient will maintain dynamic standing balance with assist during activities of daily living (OT)  Flowsheets (Taken 05/27/2022 1618) LTG: Pt will maintain dynamic standing balance during ADLs with: (downgraded JLS) Moderate Assistance - Patient 50 - 74% Note: downgraded JLS    Problem: RH Bathing Goal: LTG Patient will bathe all body parts with assist levels (OT) Description: LTG: Patient will bathe all body parts with assist levels (OT) Flowsheets (Taken 05/27/2022 1618) LTG: Pt will perform bathing with assistance level/cueing: (downgraded JLS) Moderate Assistance - Patient 50 - 74% Note: downgraded JLS    Problem: RH Dressing Goal: LTG Patient will perform upper body dressing (OT) Description: LTG Patient will perform upper body dressing with assist, with/without cues (OT). Flowsheets (Taken 05/27/2022 1618) LTG: Pt will perform upper body dressing with assistance level of: (downgraded JLS) Moderate Assistance - Patient 50 - 74% Note: downgraded JLS  Goal: LTG Patient will perform lower body dressing w/assist (OT) Description: LTG: Patient will perform lower body dressing with assist, with/without cues in positioning using equipment (OT) Flowsheets (Taken 05/27/2022 1618) LTG: Pt will perform lower body dressing with assistance level of: (downgraded JLS) Maximal Assistance - Patient 25 - 49% Note: downgraded JLS    Problem: RH Toileting Goal: LTG Patient will perform toileting task (3/3 steps) with assistance level (OT) Description: LTG: Patient will perform  toileting task (3/3 steps) with assistance level (OT)  Flowsheets (Taken 05/27/2022 1618) LTG: Pt will perform toileting task (3/3 steps) with assistance level: (downgraded JLS) -- Note: downgraded JLS    Problem: RH Toilet Transfers Goal: LTG Patient will perform toilet transfers w/assist (OT) Description: LTG: Patient will perform toilet transfers with assist, with/without cues using equipment (OT) Flowsheets (Taken 05/27/2022 1618) LTG: Pt will perform toilet transfers with assistance level of: (downgraded JLS) Moderate Assistance - Patient 50 - 74% Note: downgraded JLS

## 2022-05-27 NOTE — Progress Notes (Signed)
Patient ID: [name], female   DOB: 12/05/1945, 76 y.o.   MRN: [id number]  Sw left facility a VM to follow up on patient SNF referral. Sw will wait for follow up.

## 2022-05-28 LAB — PROTIME-INR
INR: 2.9 — ABNORMAL HIGH (ref 0.8–1.2)
Prothrombin Time: 30.4 seconds — ABNORMAL HIGH (ref 11.4–15.2)

## 2022-05-28 NOTE — Progress Notes (Signed)
Occupational Therapy Session Note  Patient Details  Name: [name] MRN: [id number] Date of Birth: [birth year]  Today's Date: 05/28/2022 OT Individual Time: 1100-1200 OT Individual Time Calculation (min): 60 min   Short Term Goals: Week 3:  OT Short Term Goal 1 (Week 3): STG=LTG  Skilled Therapeutic Interventions/Progress Updates:    Pt greeted seated in recliner and agreeable to OT treatment session with encouragement. Low back pain triggered with leaning forward requiring multiple rest breaks and extended time to scoot to edge of recliner and sit forward. Stand-pivot completed with increased time to stand and mod A to get to wc. Pt reported pain and nausea, for which nursing brought medications. OT educated on deep breathing strategies while waiting for medications.Worked on sit<>stands at the sink with pt able to power up with min A, but verbal and tactile cues for body position expecilally LLE prior to stand. Pt was able to maintain standing with min A and facilitation for anterior weigh shift using the sink counter as a cue to bring her pelvis towards the counter. She tolerated standing for OT to bring [facility] behind pt and OT assist to manage clothing prior to sitting. Pt voided bladder and had successful BM. Pt then stood again using the sink to pull up and was able to maintain standing balance with only min A again using the sink as a cue, while OT assisted with peri-care and donning clean brief. OT Then pulled recliner behind pt while she maintained standing for ~3-4 minutes!  Pt was very proud of herself for standing that long. Pt seemed less anxious to stand at the sink where she had something in front of her. Pt left seated in recliner at end of session with family present, call bell in reach, and needs met.  Therapy Documentation Precautions:  Precautions Precautions: Fall, Other (comment) Precaution Comments: pt has personal L LE AFO, LSO when OOB for pt comfort Restrictions Weight  Bearing Restrictions: No General: General OT Amount of Missed Time: 15 Minutes Pain:  6/10 low back pain, rest and repositioned and nursing administered pain meds  Therapy/Group: Individual Therapy  Valma Cava 05/28/2022, 12:37 PM

## 2022-05-28 NOTE — Progress Notes (Signed)
ANTICOAGULATION CONSULT NOTE- follow-up  Pharmacy Consult for Warfarin Indication:  mechanical mitral valve  Allergies  Allergen Reactions   Zoloft [Sertraline] Hives, Itching and Rash    Patient Measurements: Height: '5\' 7"'$  (170.2 cm) Weight: 55 kg (121 lb 4.1 oz) IBW/kg (Calculated) : 61.6   Vital Signs: Temp: 99.1 F (37.3 C) (07/21 0545) Temp Source: Oral (07/21 0545) BP: 135/77 (07/21 0545) Pulse Rate: 97 (07/21 0545)  Labs: Recent Labs    05/26/22 0543 05/28/22 0542  LABPROT 30.8* 30.4*  INR 3.0* 2.9*     Estimated Creatinine Clearance: 51.9 mL/min (by C-G formula based on SCr of 0.62 mg/dL).   Assessment: 76 YO female with medical history significant for mMVR in 1997 on warfarin PTA.  Patient presented to the ED with confusion and slurred speech found to have acute SAH. Lovenox / warfarin from PTA was then held on admission and resumed 6/22. Lovenox bridge completed. Pharmacy consulted to manage warfarin during rehab admission.  7/19 INR therapeutic at 3. No signs of bleeding. Warfarin 1 mg given 7/17 due to elevated INR of 4. Total weekly dose reduced by ~10% 7/18.   7/21 INR 2.9 No signs of bleeding.   Goal of Therapy:  INR 2.5-3.5 Monitor platelets by anticoagulation protocol: Yes   Plan:  Continue reduced warfarin regimen of 4.5 mg PO daily except 2 mg PO Wed/Sun INR MWF If INR is within therapeutic range on 05/31/2022, will change INR monitoring frequency to Monday's and Thursday's Continue to monitor H&H and platelets  Thank you for involving pharmacy in this patient's care.  Vaughan Basta BS, PharmD, BCPS Clinical Pharmacist 05/28/2022 8:03 AM  Contact: [phone] after 3 PM  "Be curious, not judgmental..." -Jamal Maes

## 2022-05-28 NOTE — Progress Notes (Signed)
PROGRESS NOTE   Subjective/Complaints:  No issues overnite , memory decreased on pain meds did not recall having PT yesterday.  Pain much better   ROS: Pt denies SOB, abd pain, CP, and vision changes + constipation  Objective:   No results found. No results for input(s): "WBC", "HGB", "HCT", "PLT" in the last 72 hours.    No results for input(s): "NA", "K", "CL", "CO2", "GLUCOSE", "BUN", "CREATININE", "CALCIUM" in the last 72 hours.    Intake/Output Summary (Last 24 hours) at 05/28/2022 0739 Last data filed at 05/27/2022 2239 Gross per 24 hour  Intake 300 ml  Output --  Net 300 ml         Physical Exam: Vital Signs Blood pressure 135/77, pulse 97, temperature 99.1 F (37.3 C), temperature source Oral, resp. rate 14, height '5\' 7"'$  (1.702 m), weight 55 kg, SpO2 96 %.   General: No acute distress, sitting in bed Mood and affect are appropriate Heart: Regular rate and rhythm no rubs murmurs or extra sounds Lungs: Clear to auscultation, breathing unlabored, no rales or wheezes, good air movement Abdomen: Positive bowel sounds, soft nontender to palpation, nondistended Extremities: No clubbing, cyanosis, or edema Skin: No evidence of breakdown, no evidence of rash   Neuro: CN II-XII intact, motor strength 4/5 bilateral deltoid, bicep, triceps, grip, hip flexor, knee extensors, ankle dorsiflexors/plantar flexors.  Sensory exam normal sensation to light touch lower extremities.  Speech with minimal dysarthria, sentence level, occasional word finding, but appears at baseline. Musculoskeletal: ,Back spasm below waist when extending LLE  Sensation intact LT Bilateral L3-4-5 S1 dermatomal distribution   Assessment/Plan: 1. Functional deficits which require 3+ hours per day of interdisciplinary therapy in a comprehensive inpatient rehab setting. Physiatrist is providing close team supervision and 24 hour management of  active medical problems listed below. Physiatrist and rehab team continue to assess barriers to discharge/monitor patient progress toward functional and medical goals  Care Tool:  Bathing    Body parts bathed by patient: Front perineal area, Abdomen, Chest, Left arm, Right arm, Face, Left upper leg, Right upper leg   Body parts bathed by helper: Buttocks     Bathing assist Assist Level: Moderate Assistance - Patient 50 - 74%     Upper Body Dressing/Undressing Upper body dressing   What is the patient wearing?: Pull over shirt    Upper body assist Assist Level: Moderate Assistance - Patient 50 - 74%    Lower Body Dressing/Undressing Lower body dressing      What is the patient wearing?: Incontinence brief, Pants     Lower body assist Assist for lower body dressing: Maximal Assistance - Patient 25 - 49%     Toileting Toileting Toileting Activity did not occur (Clothing management and hygiene only): N/A (no void or bm)  Toileting assist Assist for toileting: 2 Helpers (MAX A +2)     Transfers Chair/bed transfer  Transfers assist     Chair/bed transfer assist level: Moderate Assistance - Patient 50 - 74% (stand pivot) Chair/bed transfer assistive device: Programmer, multimedia   Ambulation assist   Ambulation activity did not occur: Safety/medical concerns (pain)  Assist level: 2 helpers (mod  A of 1 and +2 w/c follow) Assistive device: Other (comment) (R hallway rail) Max distance: 24f   Walk 10 feet activity   Assist     Assist level: Contact Guard/Touching assist Assistive device: Cane-quad   Walk 50 feet activity   Assist    Assist level: Contact Guard/Touching assist Assistive device: Cane-quad    Walk 150 feet activity   Assist Walk 150 feet activity did not occur: Safety/medical concerns         Walk 10 feet on uneven surface  activity   Assist     Assist level: Contact Guard/Touching assist Assistive device:  Cane-quad   Wheelchair     Assist Is the patient using a wheelchair?: No (only for time management due to slow gait speed)             Wheelchair 50 feet with 2 turns activity    Assist            Wheelchair 150 feet activity     Assist          Blood pressure 135/77, pulse 97, temperature 99.1 F (37.3 C), temperature source Oral, resp. rate 14, height '5\' 7"'$  (1.702 m), weight 55 kg, SpO2 96 %.  Medical Problem List and Plan: 1. 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  Antithrombotics: -DVT/anticoagulation: Coumadin resumed 04/29/2022 with bridging of Lovenox.  INR goal 2.5-3.5, INR still low , INR 3   3. Pain: lumbar scoliosis and stenosis , myofascial component ,   Lumbar xray no new changes vs MRI 5/23, although a small vertebral compression fracture may cause exacerbation of sx,  no MRI ordered since pt would need to be sedated with IV and no change in treatment would be necessary  CT scan reviewed looks like MRI from 03/2022.  Could still have end plate stress rxn but no change in treatment needed. May trial lumbar corset for comfort Now on Nucynta   '100mg'$  QID andmonitor for mental status changes  4. Mood/Sleep: Provide emotional support             -antipsychotic agents: N/A 5. Neuropsych/cognition: This patient is capable of making decisions on her own behalf. 6. Skin/Wound Care: Routine skin checks 7. Fluids/Electrolytes/Nutrition: Routine in and outs with follow-up chemistries 8.  History of mitral valve replacement 1997.  Resuming Coumadin with bridging of Lovenox 04/29/2022.  Follow-up per cardiology services as needed -7/6 INR above goal, coumadin held today, monitor for bleeding 7/7 pharmacy adjusting  coumadin dose 9.  Hyperlipidemia.  Continue Lipitor 10.  Hypertension.  Continue Norvasc 5 mg daily Vitals:   05/27/22 1930 05/28/22 0545  BP: 115/77 135/77  Pulse: 100 97  Resp: 18 14  Temp: 98.4 F (36.9 C) 99.1 F (37.3 C)  SpO2: 95% 96%   7/21 Well controlled 11.  Prediabetes.  Hemoglobin A1c 6.1.  Diet controlled/SSI discontinued 12.  History of lumbar spinal stenosis  with increased radicular pain , would not rec reschedule  ESI due to anticoag issues -would not push distances for ambulation  13.  GERD.  Protonix 14. Insomnia She plans  to find out what medication she takes at home for consideration of use at the hospital 15. Hypokalemia  -KCL 44mq x2 , recheck     Latest Ref Rng & Units 05/24/2022    5:41 AM 05/04/2022    6:10 AM 05/03/2022    6:16 AM  BMP  Glucose 70 - 99 mg/dL 117  122  118   BUN 8 - 23 mg/dL '10  20  19   '$ Creatinine 0.44 - 1.00 mg/dL 0.62  0.87  0.73   Sodium 135 - 145 mmol/L 136  142  138   Potassium 3.5 - 5.1 mmol/L 3.6  4.1  3.4   Chloride 98 - 111 mmol/L 99  106  105   CO2 22 - 32 mmol/L '27  29  28   '$ Calcium 8.9 - 10.3 mg/dL 9.2  9.7  9.1    7/2 K+ at 4.1 (6/27), trend with weekly labs BMP normal 7/17 16. Anemia mild  -Continue to monitor    Latest Ref Rng & Units 05/24/2022    5:41 AM 05/13/2022    5:23 AM 05/03/2022    6:16 AM  CBC  WBC 4.0 - 10.5 K/uL 7.9  6.4  4.4   Hemoglobin 12.0 - 15.0 g/dL 11.5  11.0  11.4   Hematocrit 36.0 - 46.0 % 36.2  34.9  35.8   Platelets 150 - 400 K/uL 242  244  149    continue to follow with weekly labs Hgb on low side but stable 7/17 17. Constipation  -She was given '30mg'$  sorbitol today, continue  senokot  -senna 3 tabs BID    LOS: 28 days A FACE TO FACE EVALUATION WAS PERFORMED  ACharlett Blake7/21/2023, 7:39 AM

## 2022-05-28 NOTE — Progress Notes (Signed)
Physical Therapy Session Note  Patient Details  Name: [name] MRN: [id number] Date of Birth: Aug 18, 1946  Today's Date: 05/28/2022 PT Individual Time: 0900-0930 PT Individual Time Calculation (min): 30 min   Short Term Goals: Week 4:  PT Short Term Goal 1 (Week 4): = to LTGs based on ELOS  Skilled Therapeutic Interventions/Progress Updates: Pt presented in w/c agreeable to therapy. Pt c/o pain in low back (TLSO donned) but states "it's ok right now" but did nor rate. Pt agreeable to work on Sit to stand in parallel bars. Pt transported to rehab gym and performed x 2 Sit to stand in parallel bars. Pt required facilitation at hips to decrease posterior lean and pt was able to follow verbal cues to "move L shoulder towards me" to decrease trunk rotation. Pt was able to stand approx 30 sec each before requesting to sit due to increased pain. Pt noted to demonstrate trunk rotation in w/c however unable to clarify if due to pain. Pt transported back to room at end of session and left with call bell within reach, husband present and current needs met.      Therapy Documentation Precautions:  Precautions Precautions: Fall, Other (comment) Precaution Comments: pt has personal L LE AFO, LSO when OOB for pt comfort Restrictions Weight Bearing Restrictions: No General:   Vital Signs:   Pain:   Mobility:   Locomotion :    Trunk/Postural Assessment :    Balance:   Exercises:   Other Treatments:      Therapy/Group: Individual Therapy  Jakaya Jacobowitz 05/28/2022, 12:48 PM

## 2022-05-28 NOTE — Progress Notes (Signed)
Physical Therapy Session Note  Patient Details  Name: [name] MRN: [id number] Date of Birth: 01/19/1946  Today's Date: 05/28/2022 PT Individual Time: 1400-1457 PT Individual Time Calculation (min): 57 min   Short Term Goals: Week 3:  PT Short Term Goal 1 (Week 3): Pt will perform supine<>sit with mod assist of 1 PT Short Term Goal 1 - Progress (Week 3): Progressing toward goal PT Short Term Goal 2 (Week 3): Pt will tolerate sitting on EOB for at least 5 minutes for ADL tasks PT Short Term Goal 2 - Progress (Week 3): Met PT Short Term Goal 3 (Week 3): Pt will perform sit<>stand transfers with mod assist of 1 PT Short Term Goal 3 - Progress (Week 3): Progressing toward goal PT Short Term Goal 4 (Week 3): Pt will consistently participate in bed<>chair/BSC transfers with mod assist of 1 PT Short Term Goal 4 - Progress (Week 3): Progressing toward goal PT Short Term Goal 5 (Week 3): Pt will participate in gait training of 10ft using LRAD with mod assist of 1 PT Short Term Goal 5 - Progress (Week 3): Not met Week 4:  PT Short Term Goal 1 (Week 4): = to LTGs based on ELOS  Skilled Therapeutic Interventions/Progress Updates:   Received pt sitting in recliner with husband present at bedside. Pt agreeable to PT treatment with encouragement and reported pain 4/10 in low back (premedicated and LSO donned). Session with emphasis on functional mobility/transfers, generalized strengthening and endurance, and dynamic standing balance/coordination. Pt required x 3 attempts and max A to try to stand and pivot into WC with therapist positioned in front and both arms on therapist's shoulders. However, pt ultimately unable to come completely upright due to strong posterior pushing despite cues for anterior weight shifting. Pt reported "sitting for too long" and now being unable to stand. Retrieved Stedy and pt stood in Stedy with mod A and transferred to WC dependently. Pt transported to/from room in WC  dependently for time management purposes. Pt performed seated BLE strengthening on Kinetron at 30 cm/sec for 1 minute x 4 trials with RUE support with emphasis on quad/glute strength. Pt required increased time and encouragement to work on Kinetron due to fear of it "making her worse" - educated pt on benefits of LE strengthening and reassured pt it would not make her any worse. Transported pt through [place] to look out tall glass windows for change in environmental stimuli and to uplift spirits. Although, unsure if pt enjoyed it or not as she was looking down at floor due to being afraid of heights, but then told therapist she enjoyed it. Returned to room and concluded session with pt sitting in WC, needs within reach, and seatbelt alarm on.   Therapy Documentation Precautions:  Precautions Precautions: Fall, Other (comment) Precaution Comments: pt has personal L LE AFO, LSO when OOB for pt comfort Restrictions Weight Bearing Restrictions: No  Therapy/Group: Individual Therapy  M    PT, DPT  05/28/2022, 7:21 AM  

## 2022-05-28 NOTE — Progress Notes (Signed)
Occupational Therapy Session Note  Patient Details  Name: [name] MRN: [id number] Date of Birth: Feb 16, 1946  Today's Date: 05/28/2022 OT Individual Time: 0803-0900 OT Individual Time Calculation (min): 57 min    Short Term Goals: Week 4:  OT Short Term Goal 1 (Week 4): continue working towards [employer]  Skilled Therapeutic Interventions/Progress Updates:  Pt greeted supine in bed reporting need built up handle for utensils, retrieved new handle and educated pt on making sure she doesn't leave it on the tray.   Pt completed supine>sit MAX A needing + time and effort to transition, pt did seem less affected by pain this session during supine>sit transition.  Pt was able to tolerate sitting EOB with CGA to completed UB bathing from EOB with overall MINA. Pt needed MOD A to don OH shirt and total A to don LSO. Pt needs MAX A +2 for LB dressing as pt needs assist for sit>stand and second person to assist with pulling pants up to waist line in standing.  Pt reports + pain during sit>stand needing to sit for rest break. Pt completed stand pivot to w/c with face to face assist with MAX A. Once in w/c donned shoes,sock, AFO with total A.  Pt left seated in w/c all needs within reach.                   Therapy Documentation Precautions:  Precautions Precautions: Fall, Other (comment) Precaution Comments: pt has personal L LE AFO, LSO when OOB for pt comfort Restrictions Weight Bearing Restrictions: No    Pain: pt reported max pain of 5/10 with movement in low back, rest breaks, repositioning used as pain mgmt strategies.      Therapy/Group: Individual Therapy  Corinne Ports [facility] 05/28/2022, 12:00 PM

## 2022-05-28 NOTE — Progress Notes (Signed)
Speech Language Pathology Weekly Progress Note  Patient Details  Name: [name] MRN: [id number] Date of Birth: 01/08/1946  Beginning of progress report period: May 21, 2022 End of progress report period: May 28, 2022  Short Term Goals: Week 4: SLP Short Term Goal 1 (Week 4): STG=LTG due to ELOS SLP Short Term Goal 1 - Progress (Week 4): Progressing toward goal    New Short Term Goals: Week 5: SLP Short Term Goal 1 (Week 5): STG=LTG due to ELOS  Weekly Progress Updates:  Patient continues to progress slowly toward long-term cognitive-linguistic goals in the areas of problem solving, memory, attention, and high level word finding. Overall, patient's cognitive functioning continues to fluctuate but patient is currently completing basic level cognitive tasks with min-to-mod A for problem solving and at least mod, at times max A, for recall. Pt continues to exhibit minimal-to-no word finding difficulty at the conversation level. Patient and family education is ongoing. Patient's family is unable to provide the necessary level of assistance needed at discharge, therefore, her discharge plan has now been changed to SNF. Patient would benefit from continued skilled SLP intervention to maximize her cognitive-linguistic functioning and overall functional independence prior to discharge   Intensity: Minumum of 1-2 x/day, 30 to 90 minutes Frequency: 3 to 5 out of 7 days Duration/Length of Stay: TBD due to SNF placement Treatment/Interventions: Cognitive remediation/compensation;Cueing hierarchy;Patient/family education;Functional tasks;Speech/Language facilitation;Internal/external aids;Environmental controls     [place], [place] 05/28/2022, 3:57 PM

## 2022-05-29 DIAGNOSIS — I69320 Aphasia following cerebral infarction: Secondary | ICD-10-CM

## 2022-05-29 DIAGNOSIS — K5901 Slow transit constipation: Secondary | ICD-10-CM

## 2022-05-29 NOTE — Progress Notes (Signed)
Occupational Therapy Session Note  Patient Details  Name: [name] MRN: [id number] Date of Birth: 1946-06-30  Today's Date: 05/29/2022 OT Individual Time: 7253-6644 OT Individual Time Calculation (min): 70 min    Short Term Goals: Week 3:  OT Short Term Goal 1 (Week 3): STG=LTG  Skilled Therapeutic Interventions/Progress Updates:  Pt awake and finishing lunch in bed upon OT arrival to the room. Pt reports, "I'm feeling kind of slow." Pt in agreement for OT session.   Therapy Documentation Precautions:  Precautions Precautions: Fall, Other (comment) Precaution Comments: pt has personal L LE AFO, LSO when OOB for pt comfort Restrictions Weight Bearing Restrictions: No Vital Signs: Please see "Flowsheet" for most recent vital signs charted by nursing staff.  Pain: Pain Assessment Pain Scale: 0-10 Pain Score: 2  Pain Type: Acute pain Pain Location: Back Pain Orientation: Mid;Lower Pain Descriptors / Indicators: Pressure Pain Onset: On-going Pain Intervention(s): Distraction;Emotional support  ADL: Grooming: Supervision/safety Where Assessed-Grooming: Sitting at sink (recliner) ADL Comments: Pt already dressed upon OT arrival. Brief was checked prior to transfers with brief being dry/clean.   Pain Management: Education provided to pt on pain management techniques such as pursed-lip breathing and listening to calming music. Pt requires prompting to take deep breaths prior to mobility. Pt reports decreased pain with using deep breathing techniques and listening to classical music as a relaxation/distraction technique. When sitting at EOB, pt noted to have increased tone and intermittent extensor tone which decreases sitting balance. OT provided therapeutic co-regulation with calming voice instructing pt to take deep breaths, visualization to decrease tension, and empathetic touch with noticeable decrease in tension with these techniques.   Bed Mobility:  Pt unable to  recall log roll techniques to maintain spinal protection due to increased lower back pain. Education provided to pt on techniques to perform log roll to initiate supine > sit transfer and proper hand placement to elevate trunk to a sitting position. Pt requires maximal assistance for supine > sit transfer with R side exit.   Functional Mobility: Education provided to pt on proper transfer techniques such as scooting to the edge, proper placement of BLE, proper placement of BUE, and importance of anterior weight shifting ("nose over toes"). Pt benefits from a visual target to encourage anterior weight shifting with cues such as "put for chin on my shoulder" to encourage the appropriate position. Pt benefits from placing BUE on therapist's arms for improved comfort and due to pt demo'ing a strong pushing response if trying to place RUE on furniture surfaces to assist with transfer. [name] a SPT to the R from EOB > recliner with maximal assistance and slow movements with pt presenting with increased posterior leaning throughout the transfer. Pt then able to participate in self-care tasks at the sink while in the recliner and OT placed the heating pad in the chair to assist with pain management. OT provided education to pt's primary RN on techniques to assist pt with transfer back to bed when pt requests to return to bed. RN verbalizes understanding.   Cognition & Recall: Pt reports this being the first time transferred to the recliner. Provided education to pt on pt transferring to the recliner and w/c various times with therapy on previous days. Pt reports difficulty with recall and OT involved pt in problem solving to be able to improve recall. OT printed off a calendar to for staff to log whenever pt transfers to recliner or w/c to be placed next to daily calendar  so pt can view progress with mobility and that pt is getting out of th bed. Pt aware of cognitive deficits and became emotional  regarding these deficits. Provided therapeutic and empathetic listening for pt with improved morale at end of session. OT provided education to pt on importance of sitting in the chair with a goal to sit for 1 hour. OT assisted pt with setting time for a visual of pt's goals.   Pt requested to stay in the recliner at end of session. Pt left sitting comfortably in the recliner with personal belongings and call light within reach, belt alarm placed and activated, and comfort needs attended to.    Therapy/Group: Individual Therapy  Barbee Shropshire 05/29/2022, 4:45 PM

## 2022-05-29 NOTE — Progress Notes (Signed)
PROGRESS NOTE   Subjective/Complaints:  Pt up, just finished breakfast. Says she's comfortable. Asked when she'll have therapy today  ROS: Patient denies fever, rash, sore throat, blurred vision, dizziness, nausea, vomiting, diarrhea, cough, shortness of breath or chest pain, joint or back/neck pain, headache, or mood change.   Objective:   No results found. No results for input(s): "WBC", "HGB", "HCT", "PLT" in the last 72 hours.    No results for input(s): "NA", "K", "CL", "CO2", "GLUCOSE", "BUN", "CREATININE", "CALCIUM" in the last 72 hours.    Intake/Output Summary (Last 24 hours) at 05/29/2022 0810 Last data filed at 05/28/2022 1851 Gross per 24 hour  Intake 240 ml  Output --  Net 240 ml        Physical Exam: Vital Signs Blood pressure 116/81, pulse 93, temperature 98.6 F (37 C), temperature source Oral, resp. rate 14, height '5\' 7"'$  (1.702 m), weight 59.9 kg, SpO2 92 %.   Constitutional: No distress . Vital signs reviewed. HEENT: NCAT, EOMI, oral membranes moist Neck: supple Cardiovascular: RRR without murmur. No JVD    Respiratory/Chest: CTA Bilaterally without wheezes or rales. Normal effort    GI/Abdomen: BS +, non-tender, non-distended Ext: no clubbing, cyanosis, or edema Psych: pleasant and cooperative  Skin: No evidence of breakdown, no evidence of rash   Neuro: CN II-XII intact, motor strength 4/5 bilateral deltoid, bicep, triceps, grip, hip flexor, knee extensors, ankle dorsiflexors/plantar flexors.  Sensory exam normal sensation to light touch lower extremities.  Speech with minimal dysarthria, sentence level, mild word finding deficits, but appears at baseline. Fair insight Musculoskeletal: LBP with palpation     Assessment/Plan: 1. Functional deficits which require 3+ hours per day of interdisciplinary therapy in a comprehensive inpatient rehab setting. Physiatrist is providing close team  supervision and 24 hour management of active medical problems listed below. Physiatrist and rehab team continue to assess barriers to discharge/monitor patient progress toward functional and medical goals  Care Tool:  Bathing    Body parts bathed by patient: Right arm, Left arm, Chest, Abdomen   Body parts bathed by helper: Buttocks     Bathing assist Assist Level: Minimal Assistance - Patient > 75%     Upper Body Dressing/Undressing Upper body dressing   What is the patient wearing?: Pull over shirt    Upper body assist Assist Level: Moderate Assistance - Patient 50 - 74%    Lower Body Dressing/Undressing Lower body dressing      What is the patient wearing?: Pants     Lower body assist Assist for lower body dressing: 2 Helpers (MAX A +2)     Toileting Toileting Toileting Activity did not occur Landscape architect and hygiene only): N/A (no void or bm)  Toileting assist Assist for toileting: 2 Helpers (MAX A +2)     Transfers Chair/bed transfer  Transfers assist     Chair/bed transfer assist level: Maximal Assistance - Patient 25 - 49% (squat pivot) Chair/bed transfer assistive device: Programmer, multimedia   Ambulation assist   Ambulation activity did not occur: Safety/medical concerns (pain)  Assist level: 2 helpers (mod A of 1 and +2 w/c follow) Assistive device: Other (comment) (R  hallway rail) Max distance: 29f   Walk 10 feet activity   Assist     Assist level: Contact Guard/Touching assist Assistive device: Cane-quad   Walk 50 feet activity   Assist    Assist level: Contact Guard/Touching assist Assistive device: Cane-quad    Walk 150 feet activity   Assist Walk 150 feet activity did not occur: Safety/medical concerns         Walk 10 feet on uneven surface  activity   Assist     Assist level: Contact Guard/Touching assist Assistive device: Cane-quad   Wheelchair     Assist Is the patient using a  wheelchair?: No (only for time management due to slow gait speed)             Wheelchair 50 feet with 2 turns activity    Assist            Wheelchair 150 feet activity     Assist          Blood pressure 116/81, pulse 93, temperature 98.6 F (37 C), temperature source Oral, resp. rate 14, height '5\' 7"'$  (1.702 m), weight 59.9 kg, SpO2 92 %.  Medical Problem List and Plan: 1. 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  Antithrombotics: -DVT/anticoagulation: Coumadin resumed 04/29/2022 with bridging of Lovenox.  INR goal 2.5-3.5    3. Pain: lumbar scoliosis and stenosis , myofascial component ,   Lumbar xray no new changes vs MRI 5/23, although a small vertebral compression fracture may cause exacerbation of sx,  no MRI ordered since pt would need to be sedated with IV and no change in treatment would be necessary  CT scan reviewed looks like MRI from 03/2022.  Could still have end plate stress rxn but no change in treatment needed. May trial lumbar corset for comfort Now on Nucynta   '100mg'$  QID andmonitor for mental status changes  4. Mood/Sleep: Provide emotional support             -antipsychotic agents: N/A 5. Neuropsych/cognition: This patient is capable of making decisions on her own behalf. 6. Skin/Wound Care: Routine skin checks 7. Fluids/Electrolytes/Nutrition: Routine in and outs with follow-up chemistries 8.  History of mitral valve replacement 1997.  Resuming Coumadin with bridging of Lovenox 04/29/2022.  Follow-up per cardiology services as needed    pharmacy adjusting coumadin dose 9.  Hyperlipidemia.  Continue Lipitor 10.  Hypertension.  Continue Norvasc 5 mg daily Vitals:   05/28/22  1933 05/29/22 0600  BP: 119/86 116/81  Pulse: (!) 101 93  Resp: 17 14  Temp: 98.4 F (36.9 C) 98.6 F (37 C)  SpO2: 97% 92%   7/21 Well controlled 11.  Prediabetes.  Hemoglobin A1c 6.1.  Diet controlled/SSI discontinued 12.  History of lumbar spinal stenosis  with increased radicular pain , would not rec reschedule  ESI due to anticoag issues -would not push distances for ambulation  13.  GERD.  Protonix 14. Insomnia She plans to find out what medication she takes at home for consideration of use at the hospital 15. Hypokalemia  -KCL 260m x2 ,  recheck     Latest Ref Rng & Units 05/24/2022    5:41 AM 05/04/2022    6:10 AM 05/03/2022    6:16 AM  BMP  Glucose 70 - 99 mg/dL 117  122  118   BUN 8 - 23 mg/dL '10  20  19   '$ Creatinine 0.44 - 1.00 mg/dL 0.62  0.87  0.73   Sodium 135 - 145 mmol/L 136  142  138   Potassium 3.5 - 5.1 mmol/L 3.6  4.1  3.4   Chloride 98 - 111 mmol/L 99  106  105   CO2 22 - 32 mmol/L '27  29  28   '$ Calcium 8.9 - 10.3 mg/dL 9.2  9.7  9.1    7/2 K+ at 4.1 (6/27), trend with weekly labs F/u bmet Monday 7/24 16. Anemia mild  -Continue to monitor    Latest Ref Rng & Units 05/24/2022    5:41 AM 05/13/2022    5:23 AM 05/03/2022    6:16 AM  CBC  WBC 4.0 - 10.5 K/uL 7.9  6.4  4.4   Hemoglobin 12.0 - 15.0 g/dL 11.5  11.0  11.4   Hematocrit 36.0 - 46.0 % 36.2  34.9  35.8   Platelets 150 - 400 K/uL 242  244  149    continue to follow with weekly labs Hgb on low side but stable 7/17 17. Constipation  -She was given '30mg'$  sorbitol today, continue  senokot  -senna 3 tabs BID    LOS: 29 days A FACE TO FACE EVALUATION WAS PERFORMED  Meredith Staggers 05/29/2022, 8:10 AM

## 2022-05-29 NOTE — Progress Notes (Signed)
ANTICOAGULATION CONSULT NOTE- follow-up  Pharmacy Consult for Warfarin Indication:  mechanical mitral valve  Allergies  Allergen Reactions   Zoloft [Sertraline] Hives, Itching and Rash    Patient Measurements: Height: '5\' 7"'$  (170.2 cm) Weight: 59.9 kg (132 lb 0.9 oz) IBW/kg (Calculated) : 61.6   Vital Signs: Temp: 98.6 F (37 C) (07/22 0600) Temp Source: Oral (07/22 0600) BP: 116/81 (07/22 0600) Pulse Rate: 93 (07/22 0600)  Labs: Recent Labs    05/28/22 0542  LABPROT 30.4*  INR 2.9*     Estimated Creatinine Clearance: 56.6 mL/min (by C-G formula based on SCr of 0.62 mg/dL).   Assessment: 76 YO female with medical history significant for mMVR in 1997 on warfarin PTA.  Patient presented to the ED with confusion and slurred speech found to have acute SAH. Lovenox / warfarin from PTA was then held on admission and resumed 6/22. Lovenox bridge completed. Pharmacy consulted to manage warfarin during rehab admission.  INR therapeutic    Goal of Therapy:  INR 2.5-3.5 Monitor platelets by anticoagulation protocol: Yes   Plan:  Continue reduced warfarin regimen of 4.5 mg PO daily except 2 mg PO Wed/Sun INR MWF If INR is within therapeutic range on 05/31/2022, will change INR monitoring frequency to Monday's and Thursday's Continue to monitor H&H and platelets  Thank you Anette Guarneri, PharmD 05/29/2022 9:00 AM  Contact: [phone] after 3 PM  "Be curious, not judgmental..." -Jamal Maes

## 2022-05-30 LAB — SARS CORONAVIRUS 2 BY RT PCR: SARS Coronavirus 2 by RT PCR: NEGATIVE

## 2022-05-30 MED ORDER — SORBITOL 70 % SOLN
30.0000 mL | Freq: Once | Status: DC
Start: 2022-05-30 — End: 2022-06-01

## 2022-05-30 NOTE — Progress Notes (Signed)
Patient awaken this morning very confused ,tearful an perplex as to her life , She states she does not know who she is ,where she is or how she came to be in this place.s.Marland Kitchen States she " feel like I'm at a loss'. Reassurance provided and re-oriented to x3,Continue to monitor

## 2022-05-30 NOTE — Progress Notes (Signed)
PROGRESS NOTE   Subjective/Complaints:  Pt up in bed. Therapy in room. Very anxious this morning. Concerned about husband at home, bills, her ongoing deficits, etc.   ROS: Patient denies fever, rash, sore throat, blurred vision, dizziness, nausea, vomiting, diarrhea, cough, shortness of breath or chest pain, joint or back/neck pain, headache.   Objective:   No results found. No results for input(s): "WBC", "HGB", "HCT", "PLT" in the last 72 hours.    No results for input(s): "NA", "K", "CL", "CO2", "GLUCOSE", "BUN", "CREATININE", "CALCIUM" in the last 72 hours.    Intake/Output Summary (Last 24 hours) at 05/30/2022 0811 Last data filed at 05/30/2022 0040 Gross per 24 hour  Intake 340 ml  Output --  Net 340 ml        Physical Exam: Vital Signs Blood pressure 119/75, pulse 98, temperature 98.1 F (36.7 C), resp. rate 15, height '5\' 7"'$  (1.702 m), weight 59.9 kg, SpO2 94 %.   Constitutional: No distress . Vital signs reviewed. HEENT: NCAT, EOMI, oral membranes moist Neck: supple Cardiovascular: RRR without murmur. No JVD    Respiratory/Chest: CTA Bilaterally without wheezes or rales. Normal effort    GI/Abdomen: BS +, non-tender, non-distended Ext: no clubbing, cyanosis, or edema Psych: anxious but cooperative Skin: No evidence of breakdown, no evidence of rash   Neuro: CN II-XII intact, motor strength 4/5 bilateral deltoid, bicep, triceps, grip, hip flexor, knee extensors, ankle dorsiflexors/plantar flexors.  Sensory exam normal sensation to light touch lower extremities.  Speech with minimal dysarthria, sentence level, mild word finding deficits, but appears near baseline. Fair insight, sl confused Musculoskeletal: LBP with palpation     Assessment/Plan: 1. Functional deficits which require 3+ hours per day of interdisciplinary therapy in a comprehensive inpatient rehab setting. Physiatrist is providing close  team supervision and 24 hour management of active medical problems listed below. Physiatrist and rehab team continue to assess barriers to discharge/monitor patient progress toward functional and medical goals  Care Tool:  Bathing    Body parts bathed by patient: Right arm, Left arm, Chest, Abdomen   Body parts bathed by helper: Buttocks     Bathing assist Assist Level: Minimal Assistance - Patient > 75%     Upper Body Dressing/Undressing Upper body dressing   What is the patient wearing?: Pull over shirt    Upper body assist Assist Level: Moderate Assistance - Patient 50 - 74%    Lower Body Dressing/Undressing Lower body dressing      What is the patient wearing?: Pants     Lower body assist Assist for lower body dressing: 2 Helpers (MAX A +2)     Toileting Toileting Toileting Activity did not occur Landscape architect and hygiene only): N/A (no void or bm)  Toileting assist Assist for toileting: 2 Helpers (MAX A +2)     Transfers Chair/bed transfer  Transfers assist     Chair/bed transfer assist level: Maximal Assistance - Patient 25 - 49% (squat pivot) Chair/bed transfer assistive device: Programmer, multimedia   Ambulation assist   Ambulation activity did not occur: Safety/medical concerns (pain)  Assist level: 2 helpers (mod A of 1 and +2 w/c follow) Assistive device:  Other (comment) (R hallway rail) Max distance: 41f   Walk 10 feet activity   Assist     Assist level: Contact Guard/Touching assist Assistive device: Cane-quad   Walk 50 feet activity   Assist    Assist level: Contact Guard/Touching assist Assistive device: Cane-quad    Walk 150 feet activity   Assist Walk 150 feet activity did not occur: Safety/medical concerns         Walk 10 feet on uneven surface  activity   Assist     Assist level: Contact Guard/Touching assist Assistive device: Cane-quad   Wheelchair     Assist Is the patient using a  wheelchair?: No (only for time management due to slow gait speed)             Wheelchair 50 feet with 2 turns activity    Assist            Wheelchair 150 feet activity     Assist          Blood pressure 119/75, pulse 98, temperature 98.1 F (36.7 C), resp. rate 15, height '5\' 7"'$  (1.702 m), weight 59.9 kg, SpO2 94 %.  Medical Problem List and Plan: 1. 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  Antithrombotics: -DVT/anticoagulation: Coumadin resumed 04/29/2022 .  INR goal 2.5-3.5    3. Pain: lumbar scoliosis and stenosis , myofascial component ,   Lumbar xray no new changes vs MRI 5/23, although a small vertebral compression fracture may cause exacerbation of sx,  no MRI ordered since pt would need to be sedated with IV and no change in treatment would be necessary  CT scan reviewed looks like MRI from 03/2022.  Could still have end plate stress rxn but no change in treatment needed. May trial lumbar corset for comfort -Now on Nucynta   '100mg'$  QID.  -7/23 Has been doing fairly well from a cognitive standpoint. If confusion this morning continues, may need to decrease frequency of nucynta 4. Mood/Sleep: counseled, provided egosupport this morning             -antipsychotic agents: N/A 5. Neuropsych/cognition: This patient is capable of making decisions on her own behalf. 6. Skin/Wound Care: Routine skin checks 7. Fluids/Electrolytes/Nutrition: Routine in and outs with follow-up chemistries 8.  History of mitral valve replacement 1997.  Resuming Coumadin with bridging of Lovenox 04/29/2022.  Follow-up per cardiology services as needed    pharmacy adjusting coumadin dose 9.  Hyperlipidemia.  Continue  Lipitor 10.  Hypertension.  Continue Norvasc 5 mg daily Vitals:   05/29/22 1932 05/30/22 0422  BP: 104/69 119/75  Pulse: 94 98  Resp: 15 15  Temp: 97.8 F (36.6 C) 98.1 F (36.7 C)  SpO2: 95% 94%   7/23 Well controlled 11.  Prediabetes.  Hemoglobin A1c 6.1.  Diet controlled/SSI discontinued 12.  History of lumbar spinal stenosis  with increased radicular pain , would not rec reschedule  ESI due to anticoag issues -would not push distances for ambulation  13.  GERD.  Protonix 14. Insomnia She plans to find out what medication she takes  at home for consideration of use at the hospital 15. Hypokalemia       Latest Ref Rng & Units 05/24/2022    5:41 AM 05/04/2022    6:10 AM 05/03/2022    6:16 AM  BMP  Glucose 70 - 99 mg/dL 117  122  118   BUN 8 - 23 mg/dL '10  20  19   '$ Creatinine 0.44 - 1.00 mg/dL 0.62  0.87  0.73   Sodium 135 - 145 mmol/L 136  142  138   Potassium 3.5 - 5.1 mmol/L 3.6  4.1  3.4   Chloride 98 - 111 mmol/L 99  106  105   CO2 22 - 32 mmol/L '27  29  28   '$ Calcium 8.9 - 10.3 mg/dL 9.2  9.7  9.1    7/2 K+ at 4.1 (6/27), trend with weekly labs F/u bmet Monday 7/24 16. Anemia mild  -Continue to monitor    Latest Ref Rng & Units 05/24/2022    5:41 AM 05/13/2022    5:23 AM 05/03/2022    6:16 AM  CBC  WBC 4.0 - 10.5 K/uL 7.9  6.4  4.4   Hemoglobin 12.0 - 15.0 g/dL 11.5  11.0  11.4   Hematocrit 36.0 - 46.0 % 36.2  34.9  35.8   Platelets 150 - 400 K/uL 242  244  149    continue to follow with weekly labs Hgb on low side but stable 7/17 17. Constipation  -She was given '30mg'$  sorbitol today, continue  senokot  -senna 3 tabs BID  -last bm 7/21---give sorbitol again today   LOS: 30 days A FACE TO FACE EVALUATION WAS PERFORMED  Meredith Staggers 05/30/2022, 8:11 AM

## 2022-05-30 NOTE — Progress Notes (Signed)
Occupational Therapy Session Note  Patient Details  Name: [name] MRN: [id number] Date of Birth: 05-09-1946  Today's Date: 05/30/2022 OT Individual Time: 5885-0277 OT Individual Time Calculation (min): 60 min   OT Individual Time: 1016-1100 OT Individual Time Calculation (min): 44 min   Short Term Goals: Week 1:  OT Short Term Goal 1 (Week 1): Pt to be CGA for functional transfers with LRD OT Short Term Goal 1 - Progress (Week 1): Not met OT Short Term Goal 2 (Week 1): Pt to be Min A for LB dressing/bathing with LRD OT Short Term Goal 2 - Progress (Week 1): Not met OT Short Term Goal 3 (Week 1): Pt to be Min A for toileting tasks with LRD OT Short Term Goal 3 - Progress (Week 1): Not met OT Short Term Goal 4 (Week 1): Pt to demonstrate increased coordination to the R UE during functional tasks OT Short Term Goal 4 - Progress (Week 1): Progressing toward goal Week 2:  OT Short Term Goal 1 (Week 2): Patient will transition from sit to stand with mod assist to prepare for toilet transfer OT Short Term Goal 1 - Progress (Week 2): Met OT Short Term Goal 2 (Week 2): Patient will don pants over feet with no more than min assist OT Short Term Goal 2 - Progress (Week 2): Progressing toward goal OT Short Term Goal 3 (Week 2): Patient will pull pants over hips with no more than mod assists OT Short Term Goal 3 - Progress (Week 2): Progressing toward goal OT Short Term Goal 4 (Week 2): Patient will maintain standing with BUE in support for 10 seconds with mod assist in preparation for LB ADL OT Short Term Goal 4 - Progress (Week 2): Met Week 3:  OT Short Term Goal 1 (Week 3): STG=LTG  Skilled Therapeutic Interventions/Progress Updates:  Session 1: Patient met lying supine in bed in agreement with OT treatment session. 3/10 pain reported at rest increasing to 10/10 pain with activity in L aspect of low back. Requires max time for all parts of treatment session this date. RN present to  administer a.m. meds during session. Rolls R<>L in supine with Mod-Max A and use of chuck pad. Assist from CNA for hygiene/clothing management at bedlevel after voiding bladder. Small BM noted. Requires Max A for L sidelying to EOB. Tolerates static sitting at EOB for 8-10 min with posterior pelvic tilt despite cues for correction and Mod-Max A. Requires increased assist with fatigue. Visual feedback from mirror throughout task. Greatly limited by pain. Unable to progress to standing 2/2 poor posture seated EOB. Donned LB clothing and footwear with Max-TD. Encouraged p.o. intake after set-up of breakfast tray. Use of red tubing on spoon to improve indepednecne with self-feeding. Intention tremor noted. Patient consuming >25% of oatmeal prior to this writers exit. Session concluded with patient in supine with HOB elevated, call bell within reach, bed alarm activated and all needs met.   Session 2: Patient met later this date supine in bed in agreement with OT treatment session. 3/10 pain reported at rest and with activity after a.m. meds. Completes supine to EOB with Mod A, HOB elevated and use of grab bar. Able to advance BLE toward EOB without external assist. Squat-pivot to wc on L with heavy Max A and cues for hand placement/sequencing. Total A for wc transport to sink in prep for grooming. 3/3 grooming tasks with set-up A. Patient then verbalized need to void bladder. Sit to stand at  sink with Min-Mod A. Able to maintain BUE support on sink surface for placement of BSC although patient with posterior bias. Mod A for stand to sit 2/2 pain. 3/3 parts of toileting task with Max A. Hand hygiene seated at sink level with set-up.Total A for wc transport to outdoor area near hospital atrium. After several minutes outside patient indicated need to have BM. Total A for return to room. Transfer to [facility] in same manner as indicated above (sit to stand at sink level with Max  A for clothing management). Handoff to CNA at  conclusion of session for completion of toileting task.  Therapy Documentation Precautions:  Precautions Precautions: Fall, Other (comment) Precaution Comments: pt has personal L LE AFO, LSO when OOB for pt comfort Restrictions Weight Bearing Restrictions: No General:    Therapy/Group: Individual Therapy  Georgiann Neider R Howerton-Davis 05/30/2022, 10:58 AM

## 2022-05-31 LAB — CBC
HCT: 35.1 % — ABNORMAL LOW (ref 36.0–46.0)
Hemoglobin: 11.1 g/dL — ABNORMAL LOW (ref 12.0–15.0)
MCH: 27 pg (ref 26.0–34.0)
MCHC: 31.6 g/dL (ref 30.0–36.0)
MCV: 85.4 fL (ref 80.0–100.0)
Platelets: 228 10*3/uL (ref 150–400)
RBC: 4.11 MIL/uL (ref 3.87–5.11)
RDW: 13.8 % (ref 11.5–15.5)
WBC: 7.7 10*3/uL (ref 4.0–10.5)
nRBC: 0 % (ref 0.0–0.2)

## 2022-05-31 LAB — SARS CORONAVIRUS 2 BY RT PCR: SARS Coronavirus 2 by RT PCR: NEGATIVE

## 2022-05-31 LAB — PROTIME-INR
INR: 3 — ABNORMAL HIGH (ref 0.8–1.2)
Prothrombin Time: 30.8 seconds — ABNORMAL HIGH (ref 11.4–15.2)

## 2022-05-31 MED ORDER — TAPENTADOL HCL 100 MG PO TABS: 100.0000 mg | ORAL_TABLET | Freq: Four times a day (QID) | ORAL | 0 refills | Status: DC

## 2022-05-31 MED ORDER — WARFARIN SODIUM 2 MG PO TABS
2.0000 mg | ORAL_TABLET | ORAL | Status: DC
Start: 2022-06-06 — End: 2022-06-01

## 2022-05-31 MED ORDER — WARFARIN SODIUM 2 MG PO TABS
4.0000 mg | ORAL_TABLET | ORAL | Status: DC
  Administered 2022-05-31: 4 mg via ORAL
  Filled 2022-05-31: qty 2

## 2022-05-31 NOTE — Progress Notes (Signed)
Inpatient Rehabilitation Care Coordinator Discharge Note   Patient Details  Name: [name] MRN: [id number] Date of Birth: 06-Jul-1946   Discharge location: SNF [facility])  Length of Stay: 32 Days  Discharge activity level: CGA/Max-Total A  Home/community participation: spouse  Patient response RF:XJOITG Literacy - How often do you need to have someone help you when you read instructions, pamphlets, or other written material from your doctor or pharmacy?: Sometimes  Patient response PQ:DIYMEB Isolation - How often do you feel lonely or isolated from those around you?: Never  Services provided included: SW, Neuropsych, Pharmacy, TR, CM, RN, SLP, OT, PT, RD, MD  Financial Services:  Financial Services Utilized: Medicare    Choices offered to/list presented to: patient and spouse  Follow-up services arranged:              Patient response to transportation need: Is the patient able to respond to transportation needs?: Yes In the past 12 months, has lack of transportation kept you from medical appointments or from getting medications?: No In the past 12 months, has lack of transportation kept you from meetings, work, or from getting things needed for daily living?: No    Comments (or additional information):  Patient/Family verbalized understanding of follow-up arrangements:  Yes  Individual responsible for coordination of the follow-up plan: [name] [phone]  Confirmed correct DME delivered: [name] 05/31/2022    [name]

## 2022-05-31 NOTE — Progress Notes (Signed)
[facility] for Warfarin Indication:  mechanical mitral valve  Patient Measurements: Height: '5\' 7"'$  (170.2 cm) Weight: 59.9 kg (132 lb 0.9 oz) IBW/kg (Calculated) : 61.6  Vital Signs: Temp: 97.8 F (36.6 C) (07/24 0534) Temp Source: Oral (07/24 0534) BP: 110/67 (07/24 0534) Pulse Rate: 90 (07/24 0534)  Labs: Recent Labs    05/31/22 0708  HGB 11.1*  HCT 35.1*  PLT 228  LABPROT 30.8*  INR 3.0*   Estimated Creatinine Clearance: 56.6 mL/min (by C-G formula based on SCr of 0.62 mg/dL).  Assessment: 76 YO female with medical history significant for mMVR in 1997 on warfarin PTA.  Patient presented to the ED with confusion and slurred speech found to have acute SAH. Lovenox / warfarin from PTA was held on admission and resumed 6/22. Lovenox bridge completed. Pharmacy consulted to manage warfarin during rehab admission.   INR remains within therapeutic goal. CBC stable, no issues with bleeding reported.  INR therapeutic    Goal of Therapy:  INR 2.5-3.5 Monitor platelets by anticoagulation protocol: Yes   Plan:  Warfarin '4mg'$  PO once daily all days except Sundays ('2mg'$  instead)  Check INR MWF Continue to monitor H&H and platelets    Thank you for allowing pharmacy to be a part of this patient's care.  Ardyth Harps, PharmD Clinical Pharmacist

## 2022-05-31 NOTE — Progress Notes (Signed)
Speech Language Pathology Daily Session Note  Patient Details  Name: [name] MRN: [id number] Date of Birth: 1945/12/13  {chl ip rehab slp time calculations:304100500}  Short Term Goals: {CYE:1859093}  Skilled Therapeutic Interventions:     Pain Pain Assessment Pain Scale: 0-10 Pain Score: 0-No pain  Therapy/Group: {SLP Therapy/Group:[id number]}  Patty Sermons 05/31/2022, 11:29 AM

## 2022-05-31 NOTE — Progress Notes (Signed)
Physical Therapy Session Note  Patient Details  Name: [name] MRN: [id number] Date of Birth: June 12, 1946  Today's Date: 05/31/2022 PT Individual Time: 0900-1010 PT Individual Time Calculation (min): 70 min   Short Term Goals: Week 4:  PT Short Term Goal 1 (Week 4): = to LTGs based on ELOS  Skilled Therapeutic Interventions/Progress Updates:      Pt sitting in w/c to start. Agreeable to therapy and reports 3/10 LBP. Donned TLSO with totalA for time management. Pt reports being more "confused" and having "disorganized thinking" as of recent days. She's also worried about her husband who she hasn't seen in a few days - educated her on [facility] visitor restrictions.  Transported patient outdoors near [facility] for fresh air and to improve affect/mood. While outdoors, discussed progress, PT POC, PT goals, and general DC planning. Pt appearing anxious regarding DC plan and also reports uncertainty of how life will look after CIR.   Outdoors, worked on [employer] with LAQ (limited on L due to pain) as well as sit<>stand transfers while using hand rail outside - minA overall for standing and B knees flexed in stance. Standing tolerance max 1 minute prior to fatigue and worsening back pain. Completed x5 reps outside before patient began to c/o nausea and requesting water. Returned upstairs to [employer] in w/c for energy conservation.  Setup outside // bars to face mirror. Completed x4 sit<>Stands from pulling to stand with as little as CGA for transition as well as for standing balance. 1 minute standing. Progressed to standing marching 2x15 and then standing toe taps to 1inch platform 1x15 - minA overall.   Pt requiring extended seated rest breaks throughout session due to fatigue and back pain. She concluded session seated in w/c with all needs met. Reporting need to toilet - NT notified after session.   Therapy Documentation Precautions:  Precautions Precautions: Fall, Other (comment) Precaution  Comments: pt has personal L LE AFO, LSO when OOB for pt comfort Restrictions Weight Bearing Restrictions: No General:     Therapy/Group: Individual Therapy  Alger Simons 05/31/2022, 7:38 AM

## 2022-05-31 NOTE — Plan of Care (Signed)
  Problem: RH Balance Goal: LTG: Patient will maintain dynamic sitting balance (OT) Description: LTG:  Patient will maintain dynamic sitting balance with assistance during activities of daily living (OT) Outcome: Not Met (add Reason) Note: Pt still needs min A at times for sitting balance Goal: LTG Patient will maintain dynamic standing with ADLs (OT) Description: LTG:  Patient will maintain dynamic standing balance with assist during activities of daily living (OT)  Outcome: Completed/Met   Problem: Sit to Stand Goal: LTG:  Patient will perform sit to stand in prep for activites of daily living with assistance level (OT) Description: LTG:  Patient will perform sit to stand in prep for activites of daily living with assistance level (OT) Outcome: Not Met (add Reason) Note: Mod A sit<>stand   Problem: RH Grooming Goal: LTG Patient will perform grooming w/assist,cues/equip (OT) Description: LTG: Patient will perform grooming with assist, with/without cues using equipment (OT) Outcome: Completed/Met   Problem: RH Bathing Goal: LTG Patient will bathe all body parts with assist levels (OT) Description: LTG: Patient will bathe all body parts with assist levels (OT) Outcome: Not Met (add Reason) Note: Max A for bathing   Problem: RH Dressing Goal: LTG Patient will perform upper body dressing (OT) Description: LTG Patient will perform upper body dressing with assist, with/without cues (OT). Outcome: Completed/Met Goal: LTG Patient will perform lower body dressing w/assist (OT) Description: LTG: Patient will perform lower body dressing with assist, with/without cues in positioning using equipment (OT) Outcome: Completed/Met   Problem: RH Functional Use of Upper Extremity Goal: LTG Patient will use RT/LT upper extremity as a (OT) Description: LTG: Patient will use right/left upper extremity as a stabilizer/gross assist/diminished/nondominant/dominant level with assist, with/without cues  during functional activity (OT) Outcome: Completed/Met   Problem: RH Toilet Transfers Goal: LTG Patient will perform toilet transfers w/assist (OT) Description: LTG: Patient will perform toilet transfers with assist, with/without cues using equipment (OT) Outcome: Not Met (add Reason) Note: Max A for toilet transfers

## 2022-05-31 NOTE — Progress Notes (Signed)
PROGRESS NOTE   Subjective/Complaints:  She is sitting at the edge of bed, working with therapy.  Continues to be a little anxious today.  Pain is under control overall.   ROS: Patient denies fever, rash, sore throat, blurred vision, dizziness, nausea, vomiting, diarrhea, cough, shortness of breath or chest pain, joint or back/neck pain, headache.   Objective:   No results found. Recent Labs    05/31/22 0708  WBC 7.7  HGB 11.1*  HCT 35.1*  PLT 228      No results for input(s): "NA", "K", "CL", "CO2", "GLUCOSE", "BUN", "CREATININE", "CALCIUM" in the last 72 hours.    Intake/Output Summary (Last 24 hours) at 05/31/2022 0915 Last data filed at 05/31/2022 0500 Gross per 24 hour  Intake 540 ml  Output --  Net 540 ml         Physical Exam: Vital Signs Blood pressure 110/67, pulse 90, temperature 97.8 F (36.6 C), temperature source Oral, resp. rate 18, height '5\' 7"'$  (1.702 m), weight 59.9 kg, SpO2 95 %.   Constitutional: No distress . Vital signs reviewed. HEENT: NCAT, conjugate gaze, oral membranes moist Neck: supple Cardiovascular: RRR without murmur. No JVD    Respiratory/Chest: CTA Bilaterally without wheezes or rales. Normal effort    GI/Abdomen: BS +, soft,  non-tender, non-distended Ext: no clubbing, cyanosis, or edema Psych: anxious but cooperative, flat affect Skin: No evidence of breakdown, no evidence of rash   Neuro: CN II-XII intact, motor strength 4/5 bilateral deltoid, bicep, triceps, grip, hip flexor, knee extensors, ankle dorsiflexors/plantar flexors.  Sensory exam normal sensation to light touch lower extremities.  Speech with minimal dysarthria, sentence level, mild word finding deficits, but appears near baseline. Fair insight, sl confused Musculoskeletal: LBP with palpation     Assessment/Plan: 1. Functional deficits which require 3+ hours per day of interdisciplinary therapy in a  comprehensive inpatient rehab setting. Physiatrist is providing close team supervision and 24 hour management of active medical problems listed below. Physiatrist and rehab team continue to assess barriers to discharge/monitor patient progress toward functional and medical goals  Care Tool:  Bathing    Body parts bathed by patient: Right arm, Left arm, Chest, Abdomen   Body parts bathed by helper: Buttocks     Bathing assist Assist Level: Minimal Assistance - Patient > 75%     Upper Body Dressing/Undressing Upper body dressing   What is the patient wearing?: Pull over shirt    Upper body assist Assist Level: Moderate Assistance - Patient 50 - 74%    Lower Body Dressing/Undressing Lower body dressing      What is the patient wearing?: Pants     Lower body assist Assist for lower body dressing: 2 Helpers (MAX A +2)     Toileting Toileting Toileting Activity did not occur (Clothing management and hygiene only): N/A (no void or bm)  Toileting assist Assist for toileting: 2 Helpers (MAX A +2)     Transfers Chair/bed transfer  Transfers assist     Chair/bed transfer assist level: Maximal Assistance - Patient 25 - 49% (squat pivot) Chair/bed transfer assistive device: Programmer, multimedia   Ambulation assist   Ambulation activity  did not occur: Safety/medical concerns (pain)  Assist level: 2 helpers (mod A of 1 and +2 w/c follow) Assistive device: Other (comment) (R hallway rail) Max distance: 14f   Walk 10 feet activity   Assist     Assist level: Contact Guard/Touching assist Assistive device: Cane-quad   Walk 50 feet activity   Assist    Assist level: Contact Guard/Touching assist Assistive device: Cane-quad    Walk 150 feet activity   Assist Walk 150 feet activity did not occur: Safety/medical concerns         Walk 10 feet on uneven surface  activity   Assist     Assist level: Contact Guard/Touching assist Assistive  device: Cane-quad   Wheelchair     Assist Is the patient using a wheelchair?: No (only for time management due to slow gait speed)             Wheelchair 50 feet with 2 turns activity    Assist            Wheelchair 150 feet activity     Assist          Blood pressure 110/67, pulse 90, temperature 97.8 F (36.6 C), temperature source Oral, resp. rate 18, height '5\' 7"'$  (1.702 m), weight 59.9 kg, SpO2 95 %.  Medical Problem List and Plan: 1. 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  Antithrombotics: -DVT/anticoagulation: Coumadin resumed 04/29/2022 .  INR goal 2.5-3.5   3. Pain: lumbar scoliosis and stenosis , myofascial component ,   Lumbar xray no new changes vs MRI 5/23, although a small vertebral compression fracture may cause exacerbation of sx,  no MRI ordered since pt would need to be sedated with IV and no change in treatment would be necessary  CT scan reviewed looks like MRI from 03/2022.  Could still have end plate stress rxn but no change in treatment needed. May trial lumbar corset for comfort -Now on Nucynta   '100mg'$  QID.  -7/23 Has been doing fairly well from a cognitive standpoint. If confusion this morning continues, may need to decrease frequency of nucynta -7/24 continue Nucynta current dose for now 4. Mood/Sleep: counseled, provided egosupport this morning             -antipsychotic agents: N/A 5. Neuropsych/cognition: This patient is capable of making decisions on her own behalf. 6. Skin/Wound Care: Routine skin checks 7. Fluids/Electrolytes/Nutrition: Routine in and outs with follow-up chemistries 8.  History of mitral valve replacement 1997.  Resuming Coumadin with bridging  of Lovenox 04/29/2022.  Follow-up per cardiology services as needed    pharmacy adjusting coumadin dose 9.  Hyperlipidemia.  Continue Lipitor 10.  Hypertension.  Continue Norvasc 5 mg daily Vitals:   05/30/22 1940 05/31/22 0534  BP: 123/70 110/67  Pulse: 97 90  Resp: 15 18  Temp: (!) 97.4 F (36.3 C) 97.8 F (36.6 C)  SpO2: 95%    7/24 well controlled overall 11.  Prediabetes.  Hemoglobin A1c 6.1.  Diet controlled/SSI discontinued 12.  History of lumbar spinal stenosis  with increased radicular pain , would  not rec reschedule  ESI due to anticoag issues -would not push distances for ambulation  13.  GERD.  Protonix 14. Insomnia She plans to find out what medication she takes at home for consideration of use at the hospital 15. Hypokalemia       Latest Ref Rng & Units 05/24/2022    5:41 AM 05/04/2022    6:10 AM 05/03/2022    6:16 AM  BMP  Glucose 70 - 99 mg/dL 117  122  118   BUN 8 - 23 mg/dL '10  20  19   '$ Creatinine 0.44 - 1.00 mg/dL 0.62  0.87  0.73   Sodium 135 - 145 mmol/L 136  142  138   Potassium 3.5 - 5.1 mmol/L 3.6  4.1  3.4   Chloride 98 - 111 mmol/L 99  106  105   CO2 22 - 32 mmol/L '27  29  28   '$ Calcium 8.9 - 10.3 mg/dL 9.2  9.7  9.1    7/2 K+ at 4.1 (6/27), trend with weekly labs 16. Anemia mild  -Continue to monitor    Latest Ref Rng & Units 05/31/2022    7:08 AM 05/24/2022    5:41 AM 05/13/2022    5:23 AM  CBC  WBC 4.0 - 10.5 K/uL 7.7  7.9  6.4   Hemoglobin 12.0 - 15.0 g/dL 11.1  11.5  11.0   Hematocrit 36.0 - 46.0 % 35.1  36.2  34.9   Platelets 150 - 400 K/uL 228  242  244    continue to follow with weekly labs Hgb 11.1 stable 7/24 17. Constipation  -She was given '30mg'$  sorbitol today, continue  senokot  -senna 3 tabs BID  -last bm 7/21---give sorbitol again today  -LBM 7/23   LOS: 31 days A FACE TO FACE EVALUATION WAS PERFORMED  Jennye Boroughs 05/31/2022, 9:15 AM

## 2022-05-31 NOTE — Progress Notes (Addendum)
Patient ID: [name], female   DOB: 07-May-1946, 76 y.o.   MRN: [id number]  SW left VM for [facility] to follow up on patient unit. Sw will wait for follow up.  *Sw received follow up from Ad, Claiborne Billings at [facility]. Facility has patient bed ready. AD shared with patient and spouse that pt will be in a semi-private bed and both agreed. No additional questions or concerns. Sw will reach out to team about potentially d/c tomorrow.   [phone]  Patient will d/c to SNF tomorrow. PTAR arranged for 10 AM

## 2022-05-31 NOTE — Progress Notes (Addendum)
Occupational Therapy Session Note  Patient Details  Name: [name] MRN: [id number] Date of Birth: Sep 05, 1946  Today's Date: 05/31/2022 OT Individual Time: 0740-0900 OT Individual Time Calculation (min): 80 min   Short Term Goals: Week 3:  OT Short Term Goal 1 (Week 3): STG=LTG  Skilled Therapeutic Interventions/Progress Updates:    Pt greeted semi-reclined in bed. She reported pain was only a 2/10 this morning. Pt confused this morning talking about her sister and her mother in [place]. She was however oriented to self, place, and situation. Pt reported need to go to the bathroom. Bed mobility completed with increased time to initiate and max A due to back pain and L hemiplegia. Once seated EOB, pt reported increased back pain and became tearful. OT led pt through deep breathing exercises for pain management. Pt then stated "I'm so stupid, my mother passed." Pt tearful at this realization so OT provided emotional support. OT assisted with donning LSO seated EOB, then pt completed stand-pivot to [facility] with max A. Pt voided bladder, but needed OT assist for peri-care. LB dressing at EOB with max A and max A to stand while OT pulled up pants. Pt took rest break, then stand-pivot to wc with max A. UB bathing/dressing tasks from wc at the sink with min A. Pt set-up for breakfast while OT educated on OT goals of care. Pt left seated in wc with call bell in reach and needs met.   Therapy Documentation Precautions:  Precautions Precautions: Fall, Other (comment) Precaution Comments: pt has personal L LE AFO, LSO when OOB for pt comfort Restrictions Weight Bearing Restrictions: No  Pain: 2/10 back pain, rest and respositioned for pain management   Therapy/Group: Individual Therapy  Valma Cava 05/31/2022, 9:11 AM

## 2022-05-31 NOTE — Plan of Care (Signed)
See DC note for explanation on not met goals.   Problem: RH Balance Goal: LTG Patient will maintain dynamic sitting balance (PT) Description: LTG:  Patient will maintain dynamic sitting balance with assistance during mobility activities (PT) Outcome: Not Met (add Reason) Goal: LTG Patient will maintain dynamic standing balance (PT) Description: LTG:  Patient will maintain dynamic standing balance with assistance during mobility activities (PT) Outcome: Not Met (add Reason)   Problem: RH Bed Mobility Goal: LTG Patient will perform bed mobility with assist (PT) Description: LTG: Patient will perform bed mobility with assistance, with/without cues (PT). Outcome: Not Met (add Reason)   Problem: RH Bed to Chair Transfers Goal: LTG Patient will perform bed/chair transfers w/assist (PT) Description: LTG: Patient will perform bed to chair transfers with assistance (PT). Outcome: Not Met (add Reason)   Problem: RH Car Transfers Goal: LTG Patient will perform car transfers with assist (PT) Description: LTG: Patient will perform car transfers with assistance (PT). Outcome: Not Met (add Reason)   Problem: RH Ambulation Goal: LTG Patient will ambulate in controlled environment (PT) Description: LTG: Patient will ambulate in a controlled environment, # of feet with assistance (PT). Outcome: Not Met (add Reason) Goal: LTG Patient will ambulate in home environment (PT) Description: LTG: Patient will ambulate in home environment, # of feet with assistance (PT). Outcome: Not Met (add Reason)   Problem: RH Stairs Goal: LTG Patient will ambulate up and down stairs w/assist (PT) Description: LTG: Patient will ambulate up and down # of stairs with assistance (PT) Outcome: Not Met (add Reason)   Problem: RH Wheelchair Mobility Goal: LTG Patient will propel w/c in controlled environment (PT) Description: LTG: Patient will propel wheelchair in controlled environment, # of feet with assist  (PT) Outcome: Not Met (add Reason)   Problem: Sit to Stand Goal: LTG:  Patient will perform sit to stand with assistance level (PT) Description: LTG:  Patient will perform sit to stand with assistance level (PT) Outcome: Completed/Met

## 2022-05-31 NOTE — Discharge Summary (Signed)
Physical Therapy Discharge Summary  Patient Details  Name: [name] MRN: [id number] Date of Birth: Dec 14, 1945  Patient has met 1 of 10 long term goals. See below for details on lack of progress. Patient to discharge at a wheelchair level Max Assist.   Patient's care partner requires assistance to provide the necessary physical and cognitive assistance at discharge.   Reasons goals not met: Unfortunately, pt had acute onset of back pain with unclear etiology. This lead to a decline in her functional mobility and limited her tolerance to therapy significantly. Prior to the onset of back pain, she was ambulating ~257ft with CGA and NBQC, requiring minA for transfers and stairs (05/05/22). Since then, she has been max/totalA overall, ambulating only short distances with +2 assist. Imaging done showing no evidence of acute abnormality with moderate to severe scoliosis with moderate to severe degenerative changes. Due to decline in status and husband's inability to safely care for patient, plan for DC SNF.   Recommendation:  Patient will benefit from ongoing skilled PT services in skilled nursing facility setting to continue to advance safe functional mobility, address ongoing impairments in L sided weakness, bed mobility, functional transfers, gait training, caregiver training, home safety, car transfers, and minimize fall risk.  Equipment: No equipment provided. TBD by next level of care  Reasons for discharge: lack of progress toward goals and discharge from hospital  Patient/family agrees with progress made and goals achieved: Yes  PT Discharge Precautions/Restrictions Precautions Precautions: Fall;Other (comment) Precaution Comments: pt has personal L LE AFO, LSO when OOB for pt comfort Restrictions Weight Bearing Restrictions: No Pain Pain Assessment Pain Scale: 0-10 Pain Score: 4  Pain Location: Back Pain Orientation: Left;Lower Pain Intervention(s): Medication (See eMAR) Pain  Interference Pain Interference Pain Effect on Sleep: 4. Almost constantly Pain Interference with Therapy Activities: 4. Almost constantly Pain Interference with Day-to-Day Activities: 4. Almost constantly Vision/Perception  Vision - History Ability to See in Adequate Light: 0 Adequate Perception Perception: Within Functional Limits Praxis Praxis: Impaired Praxis Impairment Details: Motor planning  Cognition Overall Cognitive Status: History of cognitive impairments - at baseline Arousal/Alertness: Awake/alert Orientation Level: Oriented to person;Oriented to place Attention: Selective;Sustained Focused Attention: Appears intact Sustained Attention: Appears intact Selective Attention: Impaired Selective Attention Impairment: Verbal complex;Functional complex Memory: Impaired Memory Impairment: Decreased recall of new information;Decreased short term memory;Retrieval deficit Awareness: Impaired Awareness Impairment: Emergent impairment;Anticipatory impairment Problem Solving: Impaired Problem Solving Impairment: Functional complex Safety/Judgment: Appears intact Sensation Sensation Light Touch: Appears Intact Hot/Cold: Not tested Proprioception: Impaired by gross assessment Stereognosis: Not tested Coordination Coordination and Movement Description: Impaired related to L hemi and decreased coordination/balance. Also affected by back pain Motor  Motor Motor: Hemiplegia Motor - Discharge Observations: mild L hemiparesis. Back pain of unclear etiology  Mobility Bed Mobility Bed Mobility: Supine to Sit;Sit to Supine Supine to Sit: Maximal Assistance - Patient - Patient 25-49% Sit to Supine: Maximal Assistance - Patient 25-49% Transfers Transfers: Sit to Stand;Stand to Sit;Stand Pivot Transfers Sit to Stand: Minimal Assistance - Patient > 75% Stand to Sit: Moderate Assistance - Patient 50-74% Stand Pivot Transfers: Maximal Assistance - Patient 25 - 49% Stand Pivot Transfer  Details: Verbal cues for sequencing;Verbal cues for technique;Verbal cues for precautions/safety;Verbal cues for safe use of DME/AE;Verbal cues for gait pattern;Tactile cues for sequencing;Tactile cues for weight shifting;Manual facilitation for weight shifting;Manual facilitation for placement;Tactile cues for placement;Tactile cues for posture Transfer (Assistive device): 1 person hand held assist Locomotion  Gait Ambulation: No Gait Gait: No Stairs /  Additional Locomotion Stairs: No Pick up small object from the floor (from standing position) activity did not occur: Safety/medical concerns Wheelchair Mobility Wheelchair Mobility: No  Trunk/Postural Assessment  Cervical Assessment Cervical Assessment: Exceptions to [facility] (forward head) Thoracic Assessment Thoracic Assessment: Exceptions to [facility] (rounded shoulders) Lumbar Assessment Lumbar Assessment: Exceptions to [facility] (posterior pelvic tilt and sacral sitting) Postural Control Postural Control: Deficits on evaluation Righting Reactions: delayed and inadequate Postural Limitations: decreased  Balance Balance Balance Assessed: Yes Static Sitting Balance Static Sitting - Balance Support: Feet supported Static Sitting - Level of Assistance: 5: Stand by assistance Dynamic Sitting Balance Dynamic Sitting - Balance Support: Feet supported Dynamic Sitting - Level of Assistance: 4: Min assist Static Standing Balance Static Standing - Balance Support: Bilateral upper extremity supported Static Standing - Level of Assistance: 4: Min assist Dynamic Standing Balance Dynamic Standing - Balance Support: During functional activity Dynamic Standing - Level of Assistance: 3: Mod assist;2: Max assist Extremity Assessment      RLE Assessment RLE Assessment: Exceptions to [facility] General Strength Comments: Limited by onset of back pain with LE movement RLE Strength Right Hip Flexion: 3-/5 Right Knee Flexion: 3+/5 Right Knee Extension: 4/5 Right  Ankle Dorsiflexion: 5/5 Right Ankle Plantar Flexion: 4+/5 LLE Assessment LLE Assessment: Exceptions to [facility] General Strength Comments: pt wears L LE AFO at baseline. Limited by onset of back pain with LE movement LLE Strength Left Hip Flexion: 3-/5 Left Knee Flexion: 3+/5 Left Knee Extension: 3+/5 Left Ankle Dorsiflexion: 3+/5 Left Ankle Plantar Flexion: 3+/5    [name] P Destan Franchini PT, DPT 05/31/2022, 12:40 PM

## 2022-06-01 DIAGNOSIS — N281 Cyst of kidney, acquired: Secondary | ICD-10-CM | POA: Diagnosis not present

## 2022-06-01 DIAGNOSIS — R52 Pain, unspecified: Secondary | ICD-10-CM | POA: Diagnosis not present

## 2022-06-01 DIAGNOSIS — E569 Vitamin deficiency, unspecified: Secondary | ICD-10-CM | POA: Diagnosis not present

## 2022-06-01 DIAGNOSIS — E876 Hypokalemia: Secondary | ICD-10-CM | POA: Diagnosis not present

## 2022-06-01 DIAGNOSIS — I6300 Cerebral infarction due to thrombosis of unspecified precerebral artery: Secondary | ICD-10-CM | POA: Diagnosis not present

## 2022-06-01 DIAGNOSIS — R1111 Vomiting without nausea: Secondary | ICD-10-CM | POA: Diagnosis not present

## 2022-06-01 DIAGNOSIS — Z515 Encounter for palliative care: Secondary | ICD-10-CM | POA: Diagnosis not present

## 2022-06-01 DIAGNOSIS — M6281 Muscle weakness (generalized): Secondary | ICD-10-CM | POA: Diagnosis not present

## 2022-06-01 DIAGNOSIS — L89156 Pressure-induced deep tissue damage of sacral region: Secondary | ICD-10-CM | POA: Diagnosis not present

## 2022-06-01 DIAGNOSIS — I10 Essential (primary) hypertension: Secondary | ICD-10-CM | POA: Diagnosis not present

## 2022-06-01 DIAGNOSIS — Z23 Encounter for immunization: Secondary | ICD-10-CM | POA: Diagnosis not present

## 2022-06-01 DIAGNOSIS — M545 Low back pain, unspecified: Secondary | ICD-10-CM

## 2022-06-01 DIAGNOSIS — M25571 Pain in right ankle and joints of right foot: Secondary | ICD-10-CM | POA: Diagnosis not present

## 2022-06-01 DIAGNOSIS — Z7401 Bed confinement status: Secondary | ICD-10-CM | POA: Diagnosis not present

## 2022-06-01 DIAGNOSIS — A084 Viral intestinal infection, unspecified: Secondary | ICD-10-CM | POA: Diagnosis present

## 2022-06-01 DIAGNOSIS — Z8249 Family history of ischemic heart disease and other diseases of the circulatory system: Secondary | ICD-10-CM | POA: Diagnosis not present

## 2022-06-01 DIAGNOSIS — R112 Nausea with vomiting, unspecified: Secondary | ICD-10-CM | POA: Diagnosis not present

## 2022-06-01 DIAGNOSIS — K2900 Acute gastritis without bleeding: Secondary | ICD-10-CM | POA: Diagnosis not present

## 2022-06-01 DIAGNOSIS — E119 Type 2 diabetes mellitus without complications: Secondary | ICD-10-CM | POA: Diagnosis not present

## 2022-06-01 DIAGNOSIS — R1110 Vomiting, unspecified: Secondary | ICD-10-CM | POA: Diagnosis not present

## 2022-06-01 DIAGNOSIS — R262 Difficulty in walking, not elsewhere classified: Secondary | ICD-10-CM | POA: Diagnosis not present

## 2022-06-01 DIAGNOSIS — Z7689 Persons encountering health services in other specified circumstances: Secondary | ICD-10-CM | POA: Diagnosis not present

## 2022-06-01 DIAGNOSIS — I6381 Other cerebral infarction due to occlusion or stenosis of small artery: Secondary | ICD-10-CM | POA: Diagnosis not present

## 2022-06-01 DIAGNOSIS — I69152 Hemiplegia and hemiparesis following nontraumatic intracerebral hemorrhage affecting left dominant side: Secondary | ICD-10-CM | POA: Diagnosis not present

## 2022-06-01 DIAGNOSIS — R531 Weakness: Secondary | ICD-10-CM | POA: Diagnosis not present

## 2022-06-01 DIAGNOSIS — R404 Transient alteration of awareness: Secondary | ICD-10-CM | POA: Diagnosis not present

## 2022-06-01 DIAGNOSIS — K6389 Other specified diseases of intestine: Secondary | ICD-10-CM | POA: Diagnosis not present

## 2022-06-01 DIAGNOSIS — R4701 Aphasia: Secondary | ICD-10-CM | POA: Diagnosis not present

## 2022-06-01 DIAGNOSIS — Z1152 Encounter for screening for COVID-19: Secondary | ICD-10-CM | POA: Diagnosis not present

## 2022-06-01 DIAGNOSIS — Z823 Family history of stroke: Secondary | ICD-10-CM | POA: Diagnosis not present

## 2022-06-01 DIAGNOSIS — Z888 Allergy status to other drugs, medicaments and biological substances status: Secondary | ICD-10-CM | POA: Diagnosis not present

## 2022-06-01 DIAGNOSIS — E785 Hyperlipidemia, unspecified: Secondary | ICD-10-CM | POA: Diagnosis not present

## 2022-06-01 DIAGNOSIS — Z8673 Personal history of transient ischemic attack (TIA), and cerebral infarction without residual deficits: Secondary | ICD-10-CM | POA: Diagnosis not present

## 2022-06-01 DIAGNOSIS — R402431 Glasgow coma scale score 3-8, in the field [EMT or ambulance]: Secondary | ICD-10-CM | POA: Diagnosis not present

## 2022-06-01 DIAGNOSIS — I609 Nontraumatic subarachnoid hemorrhage, unspecified: Secondary | ICD-10-CM | POA: Diagnosis not present

## 2022-06-01 DIAGNOSIS — G8929 Other chronic pain: Secondary | ICD-10-CM

## 2022-06-01 DIAGNOSIS — G459 Transient cerebral ischemic attack, unspecified: Secondary | ICD-10-CM | POA: Diagnosis not present

## 2022-06-01 DIAGNOSIS — R410 Disorientation, unspecified: Secondary | ICD-10-CM | POA: Diagnosis not present

## 2022-06-01 DIAGNOSIS — G8194 Hemiplegia, unspecified affecting left nondominant side: Secondary | ICD-10-CM | POA: Diagnosis not present

## 2022-06-01 DIAGNOSIS — R42 Dizziness and giddiness: Secondary | ICD-10-CM | POA: Diagnosis not present

## 2022-06-01 DIAGNOSIS — Z952 Presence of prosthetic heart valve: Secondary | ICD-10-CM | POA: Diagnosis not present

## 2022-06-01 DIAGNOSIS — D696 Thrombocytopenia, unspecified: Secondary | ICD-10-CM | POA: Diagnosis not present

## 2022-06-01 DIAGNOSIS — R5383 Other fatigue: Secondary | ICD-10-CM | POA: Diagnosis not present

## 2022-06-01 DIAGNOSIS — Z83438 Family history of other disorder of lipoprotein metabolism and other lipidemia: Secondary | ICD-10-CM | POA: Diagnosis not present

## 2022-06-01 DIAGNOSIS — I6930 Unspecified sequelae of cerebral infarction: Secondary | ICD-10-CM | POA: Diagnosis not present

## 2022-06-01 DIAGNOSIS — M79671 Pain in right foot: Secondary | ICD-10-CM | POA: Diagnosis not present

## 2022-06-01 DIAGNOSIS — R3911 Hesitancy of micturition: Secondary | ICD-10-CM | POA: Diagnosis not present

## 2022-06-01 DIAGNOSIS — K529 Noninfective gastroenteritis and colitis, unspecified: Secondary | ICD-10-CM | POA: Diagnosis not present

## 2022-06-01 DIAGNOSIS — R2689 Other abnormalities of gait and mobility: Secondary | ICD-10-CM | POA: Diagnosis not present

## 2022-06-01 DIAGNOSIS — R2681 Unsteadiness on feet: Secondary | ICD-10-CM | POA: Diagnosis not present

## 2022-06-01 DIAGNOSIS — N390 Urinary tract infection, site not specified: Secondary | ICD-10-CM | POA: Diagnosis not present

## 2022-06-01 DIAGNOSIS — R9431 Abnormal electrocardiogram [ECG] [EKG]: Secondary | ICD-10-CM | POA: Diagnosis present

## 2022-06-01 DIAGNOSIS — N181 Chronic kidney disease, stage 1: Secondary | ICD-10-CM | POA: Diagnosis not present

## 2022-06-01 DIAGNOSIS — K5900 Constipation, unspecified: Secondary | ICD-10-CM | POA: Diagnosis not present

## 2022-06-01 DIAGNOSIS — F4323 Adjustment disorder with mixed anxiety and depressed mood: Secondary | ICD-10-CM | POA: Diagnosis not present

## 2022-06-01 DIAGNOSIS — G8110 Spastic hemiplegia affecting unspecified side: Secondary | ICD-10-CM | POA: Diagnosis not present

## 2022-06-01 DIAGNOSIS — R7989 Other specified abnormal findings of blood chemistry: Secondary | ICD-10-CM | POA: Diagnosis present

## 2022-06-01 DIAGNOSIS — I4891 Unspecified atrial fibrillation: Secondary | ICD-10-CM | POA: Diagnosis not present

## 2022-06-01 DIAGNOSIS — Z79899 Other long term (current) drug therapy: Secondary | ICD-10-CM | POA: Diagnosis not present

## 2022-06-01 DIAGNOSIS — Z66 Do not resuscitate: Secondary | ICD-10-CM | POA: Diagnosis present

## 2022-06-01 DIAGNOSIS — K219 Gastro-esophageal reflux disease without esophagitis: Secondary | ICD-10-CM | POA: Diagnosis present

## 2022-06-01 DIAGNOSIS — R55 Syncope and collapse: Secondary | ICD-10-CM | POA: Diagnosis not present

## 2022-06-01 DIAGNOSIS — Z7901 Long term (current) use of anticoagulants: Secondary | ICD-10-CM | POA: Diagnosis not present

## 2022-06-01 DIAGNOSIS — I639 Cerebral infarction, unspecified: Secondary | ICD-10-CM | POA: Diagnosis not present

## 2022-06-01 DIAGNOSIS — M48062 Spinal stenosis, lumbar region with neurogenic claudication: Secondary | ICD-10-CM | POA: Diagnosis not present

## 2022-06-01 DIAGNOSIS — R000 Tachycardia, unspecified: Secondary | ICD-10-CM | POA: Diagnosis not present

## 2022-06-01 DIAGNOSIS — D649 Anemia, unspecified: Secondary | ICD-10-CM | POA: Diagnosis not present

## 2022-06-01 DIAGNOSIS — R4182 Altered mental status, unspecified: Secondary | ICD-10-CM | POA: Diagnosis not present

## 2022-06-01 DIAGNOSIS — I69354 Hemiplegia and hemiparesis following cerebral infarction affecting left non-dominant side: Secondary | ICD-10-CM | POA: Diagnosis not present

## 2022-06-01 DIAGNOSIS — F0390 Unspecified dementia without behavioral disturbance: Secondary | ICD-10-CM | POA: Diagnosis present

## 2022-06-01 DIAGNOSIS — R7303 Prediabetes: Secondary | ICD-10-CM | POA: Diagnosis present

## 2022-06-01 MED ORDER — WARFARIN SODIUM 2 MG PO TABS: 2.0000 mg | ORAL_TABLET | Freq: Every day | ORAL | Status: DC

## 2022-06-01 MED ORDER — WARFARIN SODIUM 4 MG PO TABS: 4.0000 mg | ORAL_TABLET | ORAL | Status: DC

## 2022-06-01 MED ORDER — WARFARIN SODIUM 2 MG PO TABS: 2.0000 mg | ORAL_TABLET | ORAL | Status: DC

## 2022-06-01 MED ORDER — WARFARIN SODIUM 4 MG PO TABS: 4.0000 mg | ORAL_TABLET | Freq: Every day | ORAL | Status: DC

## 2022-06-01 NOTE — Progress Notes (Signed)
Attempt to call [facility] to give report left vm for nursing supervisor to call back.  Dayna Ramus

## 2022-06-01 NOTE — Progress Notes (Addendum)
Inpatient Rehabilitation Discharge Medication Review by a Pharmacist  A complete drug regimen review was completed for this patient to identify any potential clinically significant medication issues.  High Risk Drug Classes Is patient taking? Indication by Medication  Antipsychotic No   Anticoagulant Yes Warfarin - mechanical MV replacement  Antibiotic No   Opioid Yes Nucynta- chronic hip pain  Antiplatelet No   Hypoglycemics/insulin No   Vasoactive Medication Yes Norvasc- hypertension   Chemotherapy No   Other Yes Robaxin - muscle spasms Atorvastatin - hyperlipidemia Omeprazole- GERD  Prolia - osteoporosis Vitamin D3, Calcium-osteoporosis      Type of Medication Issue Identified Description of Issue Recommendation(s)  Drug Interaction(s) (clinically significant)     Duplicate Therapy     Allergy     No Medication Administration End Date     Incorrect Dose     Additional Drug Therapy Needed     Significant med changes from prior encounter (inform family/care partners about these prior to discharge).    Other       Clinically significant medication issues were identified that warrant physician communication and completion of prescribed/recommended actions by midnight of the next day:  No  Name of provider notified for urgent issues identified:   Provider Method of Notification:     Pharmacist comments:   Time spent performing this drug regimen review (minutes):  30 minutes   Eliseo Gum, PharmD PGY1 Pharmacy Resident   06/01/2022  7:57 AM  ___________________________________________________________________________ I discussed / reviewed the pharmacy note by Dr. Glennon Mac and I agree with the resident's findings and plans as documented. Vaughan Basta BS, PharmD, BCPS Clinical Pharmacist 06/01/2022 11:11 AM  Contact: [phone] after 3 PM  "Be curious, not judgmental..." -Jamal Maes ___________________________________________________________________________

## 2022-06-01 NOTE — Plan of Care (Signed)
  Problem: RH Expression Communication Goal: LTG Patient will increase word finding of common (SLP) Description: LTG:  Patient will increase word finding of common objects/daily info/abstract thoughts with cues using compensatory strategies (SLP). Outcome: Completed/Met   Problem: RH Memory Goal: LTG Patient will use memory compensatory aids to (SLP) Description: LTG:  Patient will use memory compensatory aids to recall biographical/new, daily complex information with cues (SLP) Outcome: Completed/Met   Problem: RH Problem Solving Goal: LTG Patient will demonstrate problem solving for (SLP) Description: LTG:  Patient will demonstrate problem solving for basic/complex daily situations with cues  (SLP) Outcome: Completed/Met   Problem: RH Attention Goal: LTG Patient will demonstrate this level of attention during functional activites (SLP) Description: LTG:  Patient will will demonstrate this level of attention during functional activites (SLP) Outcome: Completed/Met

## 2022-06-01 NOTE — Progress Notes (Signed)
PROGRESS NOTE   Subjective/Complaints:  Pt sitting in bed eating. No new concerns. Reports her pain is under good control.   ROS: Patient denies fever, rash, sore throat, blurred vision, dizziness, nausea, vomiting, diarrhea, cough, palpitations, shortness of breath or chest pain, joint or back/neck pain, headache.   Objective:   No results found. Recent Labs    05/31/22 0708  WBC 7.7  HGB 11.1*  HCT 35.1*  PLT 228       No results for input(s): "NA", "K", "CL", "CO2", "GLUCOSE", "BUN", "CREATININE", "CALCIUM" in the last 72 hours.    Intake/Output Summary (Last 24 hours) at 06/01/2022 0843 Last data filed at 06/01/2022 0836 Gross per 24 hour  Intake 480 ml  Output --  Net 480 ml         Physical Exam: Vital Signs Blood pressure 133/78, pulse 90, temperature 98.2 F (36.8 C), resp. rate 17, height '5\' 7"'$  (1.702 m), weight 59.9 kg, SpO2 98 %.   Constitutional: No distress . Vital signs reviewed. HEENT: NCAT, conjugate gaze, oral membranes moist Neck: supple Cardiovascular: RRR without murmur. No JVD    Respiratory/Chest: CTA Bilaterally without wheezes or rales. Good air movement GI/Abdomen: BS +, soft,  non-tender, non-distended Ext: no clubbing, cyanosis, or edema Psych: anxious but cooperative, flat affect Skin: No evidence of breakdown, no evidence of rash   Neuro: CN II-XII intact, motor strength 4/5 bilateral deltoid, bicep, triceps, grip, hip flexor, knee extensors, ankle dorsiflexors/plantar flexors.  Sensory exam normal sensation to light touch lower extremities.  Speech with minimal dysarthria, sentence level, mild word finding deficits, but appears near baseline. Fair insight, sl confused Musculoskeletal: LBP with palpation     Assessment/Plan: 1. Functional deficits which require 3+ hours per day of interdisciplinary therapy in a comprehensive inpatient rehab setting. Physiatrist is  providing close team supervision and 24 hour management of active medical problems listed below. Physiatrist and rehab team continue to assess barriers to discharge/monitor patient progress toward functional and medical goals  Care Tool:  Bathing    Body parts bathed by patient: Right arm, Left arm, Chest, Abdomen, Right upper leg, Left upper leg, Face, Front perineal area   Body parts bathed by helper: Buttocks, Right lower leg, Left lower leg     Bathing assist Assist Level: Moderate Assistance - Patient 50 - 74%     Upper Body Dressing/Undressing Upper body dressing   What is the patient wearing?: Pull over shirt    Upper body assist Assist Level: Minimal Assistance - Patient > 75%    Lower Body Dressing/Undressing Lower body dressing      What is the patient wearing?: Pants, Incontinence brief     Lower body assist Assist for lower body dressing: Maximal Assistance - Patient 25 - 49%     Toileting Toileting Toileting Activity did not occur (Clothing management and hygiene only): N/A (no void or bm)  Toileting assist Assist for toileting: Maximal Assistance - Patient 25 - 49%     Transfers Chair/bed transfer  Transfers assist     Chair/bed transfer assist level: Maximal Assistance - Patient 25 - 49% Chair/bed transfer assistive device: Programmer, multimedia  Ambulation assist   Ambulation activity did not occur: Safety/medical concerns  Assist level: 2 helpers (mod A of 1 and +2 w/c follow) Assistive device: Other (comment) (R hallway rail) Max distance: 56f   Walk 10 feet activity   Assist  Walk 10 feet activity did not occur: Safety/medical concerns  Assist level: Contact Guard/Touching assist Assistive device: Cane-quad   Walk 50 feet activity   Assist Walk 50 feet with 2 turns activity did not occur: Safety/medical concerns  Assist level: Contact Guard/Touching assist Assistive device: Cane-quad    Walk 150 feet  activity   Assist Walk 150 feet activity did not occur: Safety/medical concerns         Walk 10 feet on uneven surface  activity   Assist Walk 10 feet on uneven surfaces activity did not occur: Safety/medical concerns   Assist level: Contact Guard/Touching assist Assistive device: Cane-quad   Wheelchair     Assist Is the patient using a wheelchair?: Yes Type of Wheelchair: Manual    Wheelchair assist level: Dependent - Patient 0% Max wheelchair distance: 1535f   Wheelchair 50 feet with 2 turns activity    Assist        Assist Level: Dependent - Patient 0%   Wheelchair 150 feet activity     Assist      Assist Level: Dependent - Patient 0%   Blood pressure 133/78, pulse 90, temperature 98.2 F (36.8 C), resp. rate 17, height '5\' 7"'$  (1.702 m), weight 59.9 kg, SpO2 98 %.  Medical Problem List and Plan: 1. 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  Antithrombotics: -DVT/anticoagulation: Coumadin resumed 04/29/2022 .  INR goal 2.5-3.5   3. Pain: lumbar scoliosis and stenosis , myofascial component ,   Lumbar xray no new changes vs MRI 5/23, although a small vertebral compression fracture may cause exacerbation of sx,  no MRI ordered since pt would need to be sedated with IV and no change in treatment would be necessary  CT scan reviewed looks like MRI from 03/2022.  Could still have end plate stress rxn but no change in treatment needed. May trial lumbar corset for comfort -Now on Nucynta   '100mg'$  QID.  -7/23 Has been doing fairly well from a cognitive standpoint. If confusion this morning continues, may need to decrease frequency of nucynta -7/25 pain under good  control, continue current medications 4. Mood/Sleep: counseled, provided egosupport this morning             -antipsychotic agents: N/A 5. Neuropsych/cognition: This patient is capable of making decisions on her own behalf. 6. Skin/Wound Care: Routine skin checks 7. Fluids/Electrolytes/Nutrition: Routine in and outs with follow-up chemistries 8.  History of mitral valve replacement 1997.  Resuming Coumadin with bridging of Lovenox 04/29/2022.  Follow-up per cardiology services as needed    pharmacy adjusting coumadin dose 9.  Hyperlipidemia.  Continue Lipitor 10.  Hypertension.  Continue Norvasc 5 mg daily Vitals:   05/31/22 1948 06/01/22 0423  BP: 110/62 133/78  Pulse: 90 90  Resp: 17 17  Temp: 98.2  F (36.8 C) 98.2 F (36.8 C)  SpO2: 97% 98%   7/25 good control of BP today 11.  Prediabetes.  Hemoglobin A1c 6.1.  Diet controlled/SSI discontinued 12.  History of lumbar spinal stenosis  with increased radicular pain , would not rec reschedule  ESI due to anticoag issues -would not push distances for ambulation  13.  GERD.  Protonix 14. Insomnia She plans to find out what medication she takes at home for consideration of use at the hospital 15. Hypokalemia       Latest Ref Rng & Units 05/24/2022    5:41 AM 05/04/2022    6:10 AM 05/03/2022    6:16 AM  BMP  Glucose 70 - 99 mg/dL 117  122  118   BUN 8 - 23 mg/dL '10  20  19   '$ Creatinine 0.44 - 1.00 mg/dL 0.62  0.87  0.73   Sodium 135 - 145 mmol/L 136  142  138   Potassium 3.5 - 5.1 mmol/L 3.6  4.1  3.4   Chloride 98 - 111 mmol/L 99  106  105   CO2 22 - 32 mmol/L '27  29  28   '$ Calcium 8.9 - 10.3 mg/dL 9.2  9.7  9.1    7/2 K+ at 4.1 (6/27), trend with weekly labs K+ 3.6 7/17, rec continue to monitor at SNF 16. Anemia mild  -Continue to monitor    Latest Ref Rng & Units 05/31/2022    7:08 AM 05/24/2022    5:41 AM 05/13/2022    5:23 AM  CBC  WBC 4.0 - 10.5 K/uL 7.7  7.9  6.4   Hemoglobin 12.0 - 15.0 g/dL 11.1  11.5  11.0    Hematocrit 36.0 - 46.0 % 35.1  36.2  34.9   Platelets 150 - 400 K/uL 228  242  244    continue to follow with weekly labs Hgb 11.1 stable 7/24 17. Constipation  -She was given '30mg'$  sorbitol today, continue  senokot  -senna 3 tabs BID  -last bm 7/21---give sorbitol again today  -LBM 7/24   LOS: 32 days A FACE TO [place] 06/01/2022, 8:43 AM

## 2022-06-01 NOTE — Progress Notes (Signed)
Speech Language Pathology Discharge Summary  Patient Details  Name: CAASI GIGLIA MRN: [id number] Date of Birth: [birth year]  Patient has met 4 of 4 long term goals.  Patient to discharge at [facility] level.  Reasons goals not met: All goals met   Clinical Impression/Discharge Summary: Patient has made functional gains and has met 4 of 4 long-term goals this admission due to improved cognitive-linguistic skills in the areas of verbal/written expression, attention, problem solving, and functional recall. Pt's progress and cognition fluctuated secondary to pain, likely anxiety, and effects of sedative medications which were addressed throughout her rehab stay. This lead to increased confusion and internal distractibility. Pt is currently completing basic level tasks with at least min A verbal/visual cues. Patient and family education is complete and patient to discharge at overall min A level. Due to decline in functional status and husband's inability to safely care for patient, plan for DC to SNF. Pt will benefit from ongoing skilled ST services in skilled nursing setting to maximize cognitive function, independence, and reduce care partner burden.    Care Partner:  Caregiver Able to Provide Assistance: Yes  Type of Caregiver Assistance: Cognitive  Recommendation:  Skilled Nursing facility;Home Health SLP  Rationale for SLP Follow Up: Maximize cognitive function and independence;Reduce caregiver burden   Equipment: None   Reasons for discharge: Discharged from hospital;Treatment goals met   Patient/Family Agrees with Progress Made and Goals Achieved: Yes    Carmine Carrozza T Lizzy Hamre 06/01/2022, 3:06 PM

## 2022-06-03 DIAGNOSIS — Z7901 Long term (current) use of anticoagulants: Secondary | ICD-10-CM | POA: Diagnosis not present

## 2022-06-03 DIAGNOSIS — R531 Weakness: Secondary | ICD-10-CM | POA: Diagnosis not present

## 2022-06-03 DIAGNOSIS — I10 Essential (primary) hypertension: Secondary | ICD-10-CM | POA: Diagnosis not present

## 2022-06-03 DIAGNOSIS — G8194 Hemiplegia, unspecified affecting left nondominant side: Secondary | ICD-10-CM | POA: Diagnosis not present

## 2022-06-07 DIAGNOSIS — M545 Low back pain, unspecified: Secondary | ICD-10-CM | POA: Diagnosis not present

## 2022-06-08 DIAGNOSIS — E785 Hyperlipidemia, unspecified: Secondary | ICD-10-CM | POA: Diagnosis not present

## 2022-06-08 DIAGNOSIS — E569 Vitamin deficiency, unspecified: Secondary | ICD-10-CM | POA: Diagnosis not present

## 2022-06-08 DIAGNOSIS — D696 Thrombocytopenia, unspecified: Secondary | ICD-10-CM | POA: Diagnosis not present

## 2022-06-08 DIAGNOSIS — Z7901 Long term (current) use of anticoagulants: Secondary | ICD-10-CM | POA: Diagnosis not present

## 2022-06-08 DIAGNOSIS — L89156 Pressure-induced deep tissue damage of sacral region: Secondary | ICD-10-CM | POA: Diagnosis not present

## 2022-06-11 DIAGNOSIS — M545 Low back pain, unspecified: Secondary | ICD-10-CM | POA: Diagnosis not present

## 2022-06-11 DIAGNOSIS — Z7689 Persons encountering health services in other specified circumstances: Secondary | ICD-10-CM | POA: Diagnosis not present

## 2022-06-17 DIAGNOSIS — D696 Thrombocytopenia, unspecified: Secondary | ICD-10-CM | POA: Diagnosis not present

## 2022-06-17 DIAGNOSIS — E785 Hyperlipidemia, unspecified: Secondary | ICD-10-CM | POA: Diagnosis not present

## 2022-06-17 DIAGNOSIS — L89156 Pressure-induced deep tissue damage of sacral region: Secondary | ICD-10-CM | POA: Diagnosis not present

## 2022-06-17 DIAGNOSIS — E569 Vitamin deficiency, unspecified: Secondary | ICD-10-CM | POA: Diagnosis not present

## 2022-06-21 DIAGNOSIS — Z7901 Long term (current) use of anticoagulants: Secondary | ICD-10-CM | POA: Diagnosis not present

## 2022-06-21 DIAGNOSIS — Z952 Presence of prosthetic heart valve: Secondary | ICD-10-CM | POA: Diagnosis not present

## 2022-06-21 DIAGNOSIS — Z7689 Persons encountering health services in other specified circumstances: Secondary | ICD-10-CM | POA: Diagnosis not present

## 2022-06-25 DIAGNOSIS — K5900 Constipation, unspecified: Secondary | ICD-10-CM | POA: Diagnosis not present

## 2022-06-25 DIAGNOSIS — Z7689 Persons encountering health services in other specified circumstances: Secondary | ICD-10-CM | POA: Diagnosis not present

## 2022-06-25 DIAGNOSIS — Z952 Presence of prosthetic heart valve: Secondary | ICD-10-CM | POA: Diagnosis not present

## 2022-06-25 DIAGNOSIS — Z7901 Long term (current) use of anticoagulants: Secondary | ICD-10-CM | POA: Diagnosis not present

## 2022-06-25 DIAGNOSIS — R52 Pain, unspecified: Secondary | ICD-10-CM | POA: Diagnosis not present

## 2022-06-28 DIAGNOSIS — Z7901 Long term (current) use of anticoagulants: Secondary | ICD-10-CM | POA: Diagnosis not present

## 2022-06-28 DIAGNOSIS — Z952 Presence of prosthetic heart valve: Secondary | ICD-10-CM | POA: Diagnosis not present

## 2022-06-28 DIAGNOSIS — Z7689 Persons encountering health services in other specified circumstances: Secondary | ICD-10-CM | POA: Diagnosis not present

## 2022-07-06 DIAGNOSIS — Z952 Presence of prosthetic heart valve: Secondary | ICD-10-CM | POA: Diagnosis not present

## 2022-07-06 DIAGNOSIS — Z7689 Persons encountering health services in other specified circumstances: Secondary | ICD-10-CM | POA: Diagnosis not present

## 2022-07-06 DIAGNOSIS — Z7901 Long term (current) use of anticoagulants: Secondary | ICD-10-CM | POA: Diagnosis not present

## 2022-07-12 DIAGNOSIS — F4323 Adjustment disorder with mixed anxiety and depressed mood: Secondary | ICD-10-CM | POA: Diagnosis not present

## 2022-07-14 DIAGNOSIS — Z7901 Long term (current) use of anticoagulants: Secondary | ICD-10-CM | POA: Diagnosis not present

## 2022-07-14 DIAGNOSIS — Z7689 Persons encountering health services in other specified circumstances: Secondary | ICD-10-CM | POA: Diagnosis not present

## 2022-07-14 DIAGNOSIS — Z952 Presence of prosthetic heart valve: Secondary | ICD-10-CM | POA: Diagnosis not present

## 2022-07-19 DIAGNOSIS — F4323 Adjustment disorder with mixed anxiety and depressed mood: Secondary | ICD-10-CM | POA: Diagnosis not present

## 2022-07-20 ENCOUNTER — Encounter: Payer: Medicare Other | Attending: Physical Medicine & Rehabilitation | Admitting: Physical Medicine & Rehabilitation

## 2022-07-20 ENCOUNTER — Encounter: Payer: Self-pay | Admitting: Physical Medicine & Rehabilitation

## 2022-07-20 VITALS — BP 120/76 | HR 102 | Ht 67.0 in | Wt 112.0 lb

## 2022-07-20 DIAGNOSIS — I6300 Cerebral infarction due to thrombosis of unspecified precerebral artery: Secondary | ICD-10-CM

## 2022-07-20 DIAGNOSIS — I609 Nontraumatic subarachnoid hemorrhage, unspecified: Secondary | ICD-10-CM | POA: Insufficient documentation

## 2022-07-20 DIAGNOSIS — G8194 Hemiplegia, unspecified affecting left nondominant side: Secondary | ICD-10-CM | POA: Insufficient documentation

## 2022-07-20 NOTE — Progress Notes (Signed)
Subjective:    Patient ID: [name], female    DOB: Aug 23, 1946, 76 y.o.   MRN: [id number] 76 y.o. 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  Per chart review patient lives with spouse.  Generally modified independent with mobility.  Presented 04/26/2022 with acute onset of slurred speech and altered mental status.  Family denied any recent trauma or fall.  Patient does have a history of chronic hip pain was scheduled to have injection 04/29/2022 thus her Coumadin had been held since 04/23/2022 and she had been placed on Lovenox bridge injections 1 mg/kilogram twice daily administered by her husband.  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  Chronic infarction within the left thalamus and bilateral cerebellar hemispheres.  CT angiogram of head and neck no hemodynamically significant stenosis occlusion dissection or aneurysm.  Admission chemistries unremarkable except glucose 155 BUN 26 hemoglobin A1c 6.1, ammonia levels within normal limits urine drug screen negative.  Hospital course discussed with neurosurgery Dr. Annette Stable with conservative care repeat cranial CT scan 04/27/2022 again showing moderate amount of subarachnoid hemorrhage in the left parietal cortex with slight interval increase.  Small amount of subarachnoid hemorrhage in the right parietal cortex not changed.  Plan on resuming Coumadin with Lovenox bridge 04/29/2022.  Therapy evaluations completed due to patient decreased functional mobility was admitted for a comprehensive rehab  program.   Admit date: 04/30/2022 Discharge date: 06/01/2022 HPI  At [facility], having problem getting    CLINICAL DATA:  Initial evaluation for neuro deficit, stroke suspected.   EXAM: MRI HEAD WITHOUT CONTRAST   TECHNIQUE: Multiplanar, multiecho pulse sequences of the brain and surrounding structures were obtained without intravenous contrast.   COMPARISON:  Prior CT from earlier the same day.   FINDINGS: Brain: Moderately advanced cerebral and cerebellar atrophy. Patchy and confluent T2/FLAIR hyperintensity involving the periventricular and deep white matter both cerebral hemispheres most consistent with chronic small vessel ischemic disease, moderate in nature. Multiple scattered remote lacunar infarcts noted about the deep gray nuclei. Multiple chronic bilateral cerebellar infarcts noted. Chronic encephalomalacia and gliosis involving the cortical and subcortical right parietal lobe with associated chronic hemosiderin staining, stable. Changes of overlying right craniotomy.   No abnormal foci of restricted diffusion to suggest acute or subacute ischemia. Gray-white matter differentiation otherwise maintained. No other areas of chronic cortical infarction. No acute intracranial hemorrhage. Few punctate chronic micro hemorrhages noted about the cerebellar vermis, likely hypertensive in nature.   No mass lesion, midline shift or mass effect. No hydrocephalus or extra-axial fluid collection. Pituitary gland suprasellar region normal.   Vascular: Major intracranial vascular flow voids are maintained.   Skull and upper cervical spine: Craniocervical junction normal. Bone marrow signal intensity normal. Previous right parietal craniotomy. No acute scalp soft tissue abnormality.   Sinuses/Orbits: Globes and orbital soft tissues within normal limits. Paranasal sinuses are clear. No significant mastoid effusion.   Other: None.   IMPRESSION: 1. No acute intracranial  abnormality. 2. Moderately advanced cerebral and cerebellar atrophy with chronic microvascular ischemic disease, with multiple remote infarcts about the deep gray nuclei and cerebellum. 3. Previous right parietal craniotomy with underlying chronic encephalomalacia, stable.  Electronically Signed   By: Jeannine Boga M.D.   On: 12/28/2021 03:00  CLINICAL DATA:  76 year old female with low back pain. Pain radiating to the left leg with weakness.   EXAM: MRI LUMBAR SPINE WITHOUT CONTRAST   TECHNIQUE: Multiplanar, multisequence MR imaging of the lumbar spine was performed. No intravenous contrast was administered.   COMPARISON:  Lumbar radiographs 10/26/2019.   CT Abdomen and Pelvis 03/25/2016.   FINDINGS: Segmentation:  Normal on the comparison.   Alignment: Moderate to severe chronic levoconvex thoracolumbar scoliosis, curvature probably not significantly changed since 2020. Associated chronic straightening of lumbar lordosis appears stable from prior radiographs. There is mild superimposed grade 1 anterolisthesis at both L3-L4 and L4-L5.   Vertebrae: Chronic degenerative endplate marrow signal changes in association with scoliosis. Patchy superimposed right lateral endplate marrow edema at L3-L4 (series 9, image 10). Furthermore, there is similar conspicuous marrow edema in the left L5 pedicle and posterior elements (series 9, image 12). Normal background bone marrow signal. No other marrow edema or acute osseous abnormality. Intact visible sacrum and SI joints.   Conus medullaris and cauda equina: Conus extends to the L1 level. No lower spinal cord or conus signal abnormality.   Paraspinal and other soft tissues: Small chronic appearing bilateral renal cysts (no follow-up imaging recommended). Stable and negative other visible abdominal viscera. Negative paraspinal soft tissues.   Disc levels:   T11-T12: Negative; left side nerve root diverticulum  (normal variant).   T12-L1:  Negative.   L1-L2: Rightward disc bulging and endplate spurring. Mild right facet hypertrophy. No spinal stenosis. Mild right lateral recess and right foraminal stenosis.   L2-L3: Disc space loss. Right eccentric circumferential disc bulge and endplate spurring. Mild right facet and ligament flavum hypertrophy. No spinal stenosis. Borderline to mild right lateral recess. Mild right foraminal stenosis.   L3-L4: Mild anterolisthesis. Disc space loss. Bulky circumferential disc bulging eccentric to the right. Mild to moderate facet hypertrophy greater on the right. Borderline to mild spinal and right lateral recess stenosis. Mild left and moderate right L3 foraminal stenosis.   L4-L5: Disc space loss and circumferential disc bulging eccentric to the left. Mild to moderate facet and ligament flavum hypertrophy greater on the left. Mild to moderate left lateral recess stenosis (left L5 nerve level) without spinal stenosis (series 7, image 28). Moderate to severe left L4 foraminal stenosis (same image).   L5-S1: Leftward disc bulging and endplate spurring. Moderate left and mild right facet and ligament flavum hypertrophy. No spinal or lateral recess stenosis. Moderate left L5 foraminal stenosis.   IMPRESSION: 1. Levoconvex thoracolumbar scoliosis with chronic straightening of lumbar lordosis and mild grade 1 anterolisthesis at L3-L4 and L4-L5. 2. Degenerative marrow edema in the left posterior elements of L5 (might contribute to symptoms), as well as the right lateral endplates of K0-U5. 3. Multifactorial mild spinal stenosis at L3-L4. Up to moderate left lateral recess stenosis and severe left neural foraminal stenosis at L4-L5, moderate left foraminal stenosis at L5-S1. Query left L4 and/or L5 radiculitis.     Electronically Signed   By: Genevie Ann M.D.   On: 03/25/2022 09:14   Pain Inventory Average Pain 5 Pain Right Now 0 My pain is  intermittent, sharp, burning, and dull  LOCATION OF PAIN  Back and hip area  BOWEL Number of stools per week: 3   BLADDER Pads    Mobility walk with assistance ability to climb steps?  no do you drive?  yes use a wheelchair Do you  have any goals in this area?  yes  Function retired  Neuro/Psych bladder control problems weakness numbness tremor tingling confusion  Prior Studies Any changes since last visit?  no  Physicians involved in your care Any changes since last visit?  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   Highest education level: Not on file  Occupational History   Occupation: Retired  Tobacco Use   Smoking status: Never    Passive exposure: Never   Smokeless tobacco: Never  Vaping Use   Vaping Use: Never used  Substance and Sexual Activity   Alcohol use: No    Alcohol/week: 0.0 standard drinks of alcohol   Drug use: No   Sexual activity: Not Currently    Partners: Male    Comment: Married  Other Topics Concern   Not on file  Social History Narrative   Lives at home w/ her husband   Right-handed   Caffeine: none   Social Determinants of Health   Financial Resource Strain: Not on file  Food Insecurity: No Food Insecurity (07/18/2020)   Hunger Vital Sign    Worried About Running Out of Food in the Last Year: Never true    Ran Out of Food in the Last Year: Never true  Transportation Needs: No Transportation Needs (07/18/2020)   PRAPARE - Hydrologist (Medical): No    Lack of Transportation (Non-Medical): No  Physical Activity: Not on file  Stress: Not on file   Social Connections: Not on file   Past Surgical History:  Procedure Laterality Date   [place]   Past Medical History:  Diagnosis Date   Abnormality of gait 09/07/2016   Allergy    Diabetes mellitus without complication ([place])    Patient denies this - notes history of glucose intolerance   GERD (gastroesophageal reflux disease)    Hemiparesis and alteration of sensations as late effects of stroke ([place]) 09/07/2016   History of pneumonia 1997   Hypertension    S/P MVR (mitral valve replacement)    Mechanical mitral valve replacement at age 53 (done in [place])  // echo 7/17: EF 55-60, normal wall motion, bileaflet mechanical mitral valve prosthesis functioning normally, mild LAE, mildly reduced RVSF, small pericardial effusion   Stroke ([place]) 1997, 2013, 2015   Ht '5\' 7"'$  (1.702 m)   Wt 112 lb (50.8 kg)   BMI 17.54 kg/m   Opioid Risk Score:   Fall Risk Score:  `1  Depression screen [facility] 2/9     07/20/2022   11:44 AM 03/23/2022   11:06 AM 12/29/2021    9:21 AM 06/09/2021    2:01 PM 09/12/2020    1:32 PM 07/29/2020    1:54 PM 07/18/2020    1:15 PM  Depression screen PHQ 2/9  Decreased Interest 0 0 1 0 0 0 0  Down, Depressed, Hopeless 0 1 1 0 0 0 0  PHQ - 2 Score 0 1 2 0 0 0 0  Altered sleeping 0     1   Tired, decreased energy 0     0   Change in appetite 0     0   Feeling bad or failure about yourself  0     0   Trouble concentrating 0     0   Moving slowly or fidgety/restless 0     0   Suicidal thoughts 0     0   PHQ-9 Score 0     1   Difficult doing work/chores Not difficult at all            Review of Systems  Musculoskeletal:  Positive for back pain.       Back and hip pain   All other systems reviewed and are negative.     Objective:   Physical Exam Vitals and nursing note reviewed.  HENT:     Head: Normocephalic and atraumatic.  Eyes:      Extraocular Movements: Extraocular movements intact.     Conjunctiva/sclera: Conjunctivae normal.     Pupils: Pupils are equal, round, and reactive to light.  Musculoskeletal:     Right lower leg: No edema.     Left lower leg: Edema present.  Skin:    General: Skin is warm and dry.  Neurological:     Mental Status: She is alert and oriented to person, place, and time.     Comments: 4+/5 RUE and RLE 3/5 Left delt and biceps, 3- triceps and grip, 3/5 L HF 3/5 L KE, 2- L ADF  Sensation reduce LLE and RLE but able to sense LT lightly   Psychiatric:        Mood and Affect: Mood normal.        Behavior: Behavior normal.          Assessment & Plan:   Pt with hx Mitral valve replacement , with hx multiple embolic infarcts, Right craniotomy, and  most recently with SAH related to anticoagulation . Main residual stroke deficit is left hemiparesis from prior large right CVA.  The [facility] has mainly caused problems with balance and some additional cognitive issues.  Unfortunately she has not been able to return to home and is currently in a skilled nursing facility.  Would continue PT OT.  She may decide to pay privately for additional services.  Patient will see neurology in October and follow-up physical medicine rehab in November.  Discussed with patient and her husband, both in agreement with plan

## 2022-07-20 NOTE — Patient Instructions (Addendum)
Consider Botox in the Left Biceps   MRI HEAD WITHOUT CONTRAST   TECHNIQUE: Multiplanar, multiecho pulse sequences of the brain and surrounding structures were obtained without intravenous contrast.   COMPARISON:  Prior CT from earlier the same day.   FINDINGS: Brain: Moderately advanced cerebral and cerebellar atrophy. Patchy and confluent T2/FLAIR hyperintensity involving the periventricular and deep white matter both cerebral hemispheres most consistent with chronic small vessel ischemic disease, moderate in nature. Multiple scattered remote lacunar infarcts noted about the deep gray nuclei. Multiple chronic bilateral cerebellar infarcts noted. Chronic encephalomalacia and gliosis involving the cortical and subcortical right parietal lobe with associated chronic hemosiderin staining, stable. Changes of overlying right craniotomy.   No abnormal foci of restricted diffusion to suggest acute or subacute ischemia. Gray-white matter differentiation otherwise maintained. No other areas of chronic cortical infarction. No acute intracranial hemorrhage. Few punctate chronic micro hemorrhages noted about the cerebellar vermis, likely hypertensive in nature.   No mass lesion, midline shift or mass effect. No hydrocephalus or extra-axial fluid collection. Pituitary gland suprasellar region normal.   Vascular: Major intracranial vascular flow voids are maintained.   Skull and upper cervical spine: Craniocervical junction normal. Bone marrow signal intensity normal. Previous right parietal craniotomy. No acute scalp soft tissue abnormality.   Sinuses/Orbits: Globes and orbital soft tissues within normal limits. Paranasal sinuses are clear. No significant mastoid effusion.   Other: None.   IMPRESSION: 1. No acute intracranial abnormality. 2. Moderately advanced cerebral and cerebellar atrophy with chronic microvascular ischemic disease, with multiple remote infarcts about the deep  gray nuclei and cerebellum. 3. Previous right parietal craniotomy with underlying chronic encephalomalacia, stable.     Electronically Signed   By: Jeannine Boga M.D.   On: 12/28/2021 03:00  CLINICAL DATA:  75 year old female with low back pain. Pain radiating to the left leg with weakness.   EXAM: MRI LUMBAR SPINE WITHOUT CONTRAST   TECHNIQUE: Multiplanar, multisequence MR imaging of the lumbar spine was performed. No intravenous contrast was administered.   COMPARISON:  Lumbar radiographs 10/26/2019.   CT Abdomen and Pelvis 03/25/2016.   FINDINGS: Segmentation:  Normal on the comparison.   Alignment: Moderate to severe chronic levoconvex thoracolumbar scoliosis, curvature probably not significantly changed since 2020. Associated chronic straightening of lumbar lordosis appears stable from prior radiographs. There is mild superimposed grade 1 anterolisthesis at both L3-L4 and L4-L5.   Vertebrae: Chronic degenerative endplate marrow signal changes in association with scoliosis. Patchy superimposed right lateral endplate marrow edema at L3-L4 (series 9, image 10). Furthermore, there is similar conspicuous marrow edema in the left L5 pedicle and posterior elements (series 9, image 12). Normal background bone marrow signal. No other marrow edema or acute osseous abnormality. Intact visible sacrum and SI joints.   Conus medullaris and cauda equina: Conus extends to the L1 level. No lower spinal cord or conus signal abnormality.   Paraspinal and other soft tissues: Small chronic appearing bilateral renal cysts (no follow-up imaging recommended). Stable and negative other visible abdominal viscera. Negative paraspinal soft tissues.   Disc levels:   T11-T12: Negative; left side nerve root diverticulum (normal variant).   T12-L1:  Negative.   L1-L2: Rightward disc bulging and endplate spurring. Mild right facet hypertrophy. No spinal stenosis. Mild right lateral  recess and right foraminal stenosis.   L2-L3: Disc space loss. Right eccentric circumferential disc bulge and endplate spurring. Mild right facet and ligament flavum hypertrophy. No spinal stenosis. Borderline to mild right lateral recess. Mild right foraminal stenosis.  L3-L4: Mild anterolisthesis. Disc space loss. Bulky circumferential disc bulging eccentric to the right. Mild to moderate facet hypertrophy greater on the right. Borderline to mild spinal and right lateral recess stenosis. Mild left and moderate right L3 foraminal stenosis.   L4-L5: Disc space loss and circumferential disc bulging eccentric to the left. Mild to moderate facet and ligament flavum hypertrophy greater on the left. Mild to moderate left lateral recess stenosis (left L5 nerve level) without spinal stenosis (series 7, image 28). Moderate to severe left L4 foraminal stenosis (same image).   L5-S1: Leftward disc bulging and endplate spurring. Moderate left and mild right facet and ligament flavum hypertrophy. No spinal or lateral recess stenosis. Moderate left L5 foraminal stenosis.   IMPRESSION: 1. Levoconvex thoracolumbar scoliosis with chronic straightening of lumbar lordosis and mild grade 1 anterolisthesis at L3-L4 and L4-L5. 2. Degenerative marrow edema in the left posterior elements of L5 (might contribute to symptoms), as well as the right lateral endplates of H6-W7. 3. Multifactorial mild spinal stenosis at L3-L4. Up to moderate left lateral recess stenosis and severe left neural foraminal stenosis at L4-L5, moderate left foraminal stenosis at L5-S1. Query left L4 and/or L5 radiculitis.     Electronically Signed   By: Genevie Ann M.D.   On: 03/25/2022 09:14  CT LUMBAR SPINE WITHOUT CONTRAST   TECHNIQUE: Multidetector CT imaging of the lumbar spine was performed without intravenous contrast administration. Multiplanar CT image reconstructions were also generated.   RADIATION DOSE  REDUCTION: This exam was performed according to the departmental dose-optimization program which includes automated exposure control, adjustment of the mA and/or kV according to patient size and/or use of iterative reconstruction technique.   COMPARISON:  Lumbar MRI 03/24/2022. CT Abdomen and Pelvis 03/25/2016.   FINDINGS: Segmentation: Normal, the same numbering system used on the May MRI.   Alignment: Moderate levoconvex lumbar scoliosis with apex at L3. Stable straightening of lumbar lordosis.   Vertebrae: No acute osseous abnormality identified. Advanced degenerative endplate changes on the right side at L3-L4. Similar left lateral degenerative endplate changes at P7-T0. Left L5 posterior element degeneration, no fracture corresponding to the area of marrow edema in May. Visible sacrum and SI joints appear intact.   Paraspinal and other soft tissues: Chronic simple fluid density right hepatic cyst is partially visible but has increased since 2017, now at least 5 cm diameter, previously 2.8 cm (no follow-up imaging recommended). bilateral nephrolithiasis. Left extrarenal pelvis. No evidence of acute obstructive uropathy. Calcified aortic atherosclerosis.   Lumbar paraspinal soft tissues appears stable since May.   Disc levels: Lumbar spine degeneration appears stable to the lumbar MRI in May, see details of that exam.   IMPRESSION: 1. No acute osseous abnormality in the lumbar spine. 2. Moderate levoconvex lumbar scoliosis with lumbar spine degeneration stable compared to MRI in May, see details on that exam. 3. Bilateral nephrolithiasis. Aortic Atherosclerosis (ICD10-I70.0).     Electronically Signed   By: Genevie Ann M.D.   On: 05/21/2022 06:30  CLINICAL DATA:  Subarachnoid hemorrhage   EXAM: CT HEAD WITHOUT CONTRAST   TECHNIQUE: Contiguous axial images were obtained from the base of the skull through the vertex without intravenous contrast.   RADIATION DOSE  REDUCTION: This exam was performed according to the departmental dose-optimization program which includes automated exposure control, adjustment of the mA and/or kV according to patient size and/or use of iterative reconstruction technique.   COMPARISON:  Previous studies including the examination done on 04/26/2022   FINDINGS: Brain: There  is previous right parietal craniotomy. There is encephalomalacia in the right parietal lobe with no significant change. In image 23 of series 3, there is 9 mm high density structure in the center of area of encephalomalacia, possibly calcification with no significant change. There is moderate amount of subarachnoid hemorrhage in the left parietal cortex with slight interval increase in size. There is small focus of subarachnoid hemorrhage in the right parietal cortex with no significant change. There are no epidural or subdural fluid collections. Small old lacunar infarcts are seen in the left thalamus and both cerebellar hemispheres with no significant interval change. Ventricles are not dilated. There is no evidence of blood within the ventricles. There is small lenticular shaped focal thickening in the posterior falx with no significant change. Cortical sulci are prominent suggesting atrophy.   Vascular: Unremarkable.   Skull: There is previous right parietal craniotomy.   Sinuses/Orbits: Unremarkable.   Other: None.   IMPRESSION: There is moderate amount of subarachnoid hemorrhage in the left parietal cortex with slight interval increase. Small amount of subarachnoid hemorrhage in the right parietal cortex as not changed. There are no epidural or subdural fluid collections. There is no evidence of intraventricular blood. There is no significant focal mass effect.   Other findings as described in the body of the report.     Electronically Signed   By: Elmer Picker M.D.   On: 04/27/2022 08:23

## 2022-07-22 ENCOUNTER — Telehealth: Payer: Self-pay | Admitting: *Deleted

## 2022-07-22 NOTE — Telephone Encounter (Signed)
Called pt since she has been in the hospital and been discharged to Rehab. Spoke with pt and she stated she was in [place] in [place] and they are taking care of her and there are no plans for discharge at this time and not anytime soon per pt. Advised once she is discharged home to let us know so we can resume INR monitoring.

## 2022-07-29 DIAGNOSIS — Z952 Presence of prosthetic heart valve: Secondary | ICD-10-CM | POA: Diagnosis not present

## 2022-07-29 DIAGNOSIS — Z7901 Long term (current) use of anticoagulants: Secondary | ICD-10-CM | POA: Diagnosis not present

## 2022-07-29 DIAGNOSIS — Z7689 Persons encountering health services in other specified circumstances: Secondary | ICD-10-CM | POA: Diagnosis not present

## 2022-08-02 DIAGNOSIS — I609 Nontraumatic subarachnoid hemorrhage, unspecified: Secondary | ICD-10-CM | POA: Diagnosis not present

## 2022-08-02 DIAGNOSIS — R2689 Other abnormalities of gait and mobility: Secondary | ICD-10-CM | POA: Diagnosis not present

## 2022-08-02 DIAGNOSIS — R2681 Unsteadiness on feet: Secondary | ICD-10-CM | POA: Diagnosis not present

## 2022-08-03 DIAGNOSIS — R2681 Unsteadiness on feet: Secondary | ICD-10-CM | POA: Diagnosis not present

## 2022-08-03 DIAGNOSIS — I609 Nontraumatic subarachnoid hemorrhage, unspecified: Secondary | ICD-10-CM | POA: Diagnosis not present

## 2022-08-03 DIAGNOSIS — R2689 Other abnormalities of gait and mobility: Secondary | ICD-10-CM | POA: Diagnosis not present

## 2022-08-03 DIAGNOSIS — Z7689 Persons encountering health services in other specified circumstances: Secondary | ICD-10-CM | POA: Diagnosis not present

## 2022-08-03 DIAGNOSIS — Z7901 Long term (current) use of anticoagulants: Secondary | ICD-10-CM | POA: Diagnosis not present

## 2022-08-03 DIAGNOSIS — Z952 Presence of prosthetic heart valve: Secondary | ICD-10-CM | POA: Diagnosis not present

## 2022-08-04 DIAGNOSIS — R2681 Unsteadiness on feet: Secondary | ICD-10-CM | POA: Diagnosis not present

## 2022-08-04 DIAGNOSIS — R2689 Other abnormalities of gait and mobility: Secondary | ICD-10-CM | POA: Diagnosis not present

## 2022-08-04 DIAGNOSIS — I609 Nontraumatic subarachnoid hemorrhage, unspecified: Secondary | ICD-10-CM | POA: Diagnosis not present

## 2022-08-05 DIAGNOSIS — R2689 Other abnormalities of gait and mobility: Secondary | ICD-10-CM | POA: Diagnosis not present

## 2022-08-05 DIAGNOSIS — R2681 Unsteadiness on feet: Secondary | ICD-10-CM | POA: Diagnosis not present

## 2022-08-05 DIAGNOSIS — I609 Nontraumatic subarachnoid hemorrhage, unspecified: Secondary | ICD-10-CM | POA: Diagnosis not present

## 2022-08-06 DIAGNOSIS — R2689 Other abnormalities of gait and mobility: Secondary | ICD-10-CM | POA: Diagnosis not present

## 2022-08-06 DIAGNOSIS — N181 Chronic kidney disease, stage 1: Secondary | ICD-10-CM | POA: Diagnosis not present

## 2022-08-06 DIAGNOSIS — Z952 Presence of prosthetic heart valve: Secondary | ICD-10-CM | POA: Diagnosis not present

## 2022-08-06 DIAGNOSIS — Z7901 Long term (current) use of anticoagulants: Secondary | ICD-10-CM | POA: Diagnosis not present

## 2022-08-06 DIAGNOSIS — R2681 Unsteadiness on feet: Secondary | ICD-10-CM | POA: Diagnosis not present

## 2022-08-06 DIAGNOSIS — I609 Nontraumatic subarachnoid hemorrhage, unspecified: Secondary | ICD-10-CM | POA: Diagnosis not present

## 2022-08-06 DIAGNOSIS — Z7689 Persons encountering health services in other specified circumstances: Secondary | ICD-10-CM | POA: Diagnosis not present

## 2022-08-06 DIAGNOSIS — D649 Anemia, unspecified: Secondary | ICD-10-CM | POA: Diagnosis not present

## 2022-08-06 DIAGNOSIS — M79671 Pain in right foot: Secondary | ICD-10-CM | POA: Diagnosis not present

## 2022-08-06 DIAGNOSIS — I4891 Unspecified atrial fibrillation: Secondary | ICD-10-CM | POA: Diagnosis not present

## 2022-08-08 DIAGNOSIS — M25571 Pain in right ankle and joints of right foot: Secondary | ICD-10-CM | POA: Diagnosis not present

## 2022-08-09 DIAGNOSIS — F4323 Adjustment disorder with mixed anxiety and depressed mood: Secondary | ICD-10-CM | POA: Diagnosis not present

## 2022-08-10 ENCOUNTER — Ambulatory Visit (INDEPENDENT_AMBULATORY_CARE_PROVIDER_SITE_OTHER): Payer: Medicare Other | Admitting: Neurology

## 2022-08-10 ENCOUNTER — Encounter: Payer: Self-pay | Admitting: Neurology

## 2022-08-10 VITALS — BP 116/75 | HR 106

## 2022-08-10 DIAGNOSIS — Z952 Presence of prosthetic heart valve: Secondary | ICD-10-CM | POA: Diagnosis not present

## 2022-08-10 DIAGNOSIS — D696 Thrombocytopenia, unspecified: Secondary | ICD-10-CM

## 2022-08-10 DIAGNOSIS — I6300 Cerebral infarction due to thrombosis of unspecified precerebral artery: Secondary | ICD-10-CM | POA: Diagnosis not present

## 2022-08-10 DIAGNOSIS — I609 Nontraumatic subarachnoid hemorrhage, unspecified: Secondary | ICD-10-CM | POA: Diagnosis not present

## 2022-08-10 DIAGNOSIS — Z7689 Persons encountering health services in other specified circumstances: Secondary | ICD-10-CM | POA: Diagnosis not present

## 2022-08-10 DIAGNOSIS — Z8673 Personal history of transient ischemic attack (TIA), and cerebral infarction without residual deficits: Secondary | ICD-10-CM

## 2022-08-10 DIAGNOSIS — G8110 Spastic hemiplegia affecting unspecified side: Secondary | ICD-10-CM

## 2022-08-10 DIAGNOSIS — E119 Type 2 diabetes mellitus without complications: Secondary | ICD-10-CM | POA: Diagnosis not present

## 2022-08-10 DIAGNOSIS — Z7901 Long term (current) use of anticoagulants: Secondary | ICD-10-CM | POA: Diagnosis not present

## 2022-08-10 NOTE — Patient Instructions (Signed)
I had a long d/w patient about his recent anticoagulation related subarachnoid hemorrhage, prior multiple strokes, mechanical heart valve, risk for recurrent stroke/TIAs, personally independently reviewed imaging studies and stroke evaluation results and answered questions.Continue warfarin daily with INR goal between 2.5-3.5 due to her mechanical heart valve for secondary stroke prevention and maintain strict control of hypertension with blood pressure goal below 130/90, diabetes with hemoglobin A1c goal below 6.5% and lipids with LDL cholesterol goal below 70 mg/dL. I also advised the patient to eat a healthy diet with plenty of whole grains, cereals, fruits and vegetables, exercise regularly and maintain ideal body weight .she was encouraged to continue to work with physical and Occupational Therapy and learn to walk with a walker followup in the future with me as needed and no scheduled appointment was made

## 2022-08-10 NOTE — Progress Notes (Signed)
[facility] [address] [place]. [place] [address] [phone]       STROKE FOLLOW UP NOTE  [name] Date of Birth:  Oct 09, 1946 Medical Record Number:  [id number]   Reason for Referral: stroke follow up    CHIEF COMPLAINT:  Chief Complaint  [name] presents with   Consult    Room 32, with Massey      HPI:  [name] is a 76 y.o. 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  Routinely followed in office for stroke follow-up    Update 08/12/2021 JM: Returns for overdue 15-monthstroke follow-up unaccompanied.  Overall stable.  Denies new stroke/TIA symptoms.  Residual deficits stable.  Compliant on aspirin and warfarin as well as atorvastatin without side effects.  Cardiology increased atorvastatin dosage to 40 mg daily back in August for LDL 81 and plans on repeat lipid panel next month.  Routine follow-up with cardiology and monitoring of INR levels which have been stable.  Blood pressure today 112/68.  Monitors at home and similar to today's reading.  Reports recently speaking with pharmacist at Dr. TLoren Raceroffice who advised Gabrielle to ask about switching aspirin to Plavix due to recurrent strokes.  No further concerns at this time.    History provided for reference purposes only Update 12/03/2020 JM: Ms. HCarlesreturns for 450-monthtroke follow-up unaccompanied.  Doing well from a stroke standpoint without new stroke/TIA symptoms and reports residual left sided impairment which has been stable without worsening.   She has remained on warfarin and aspirin 81 mg daily without bleeding or bruising but with  fluctuation of INR levels. She monitors at home every Monday and follows with coumadin clinic and cardiology. Remains on atorvastatin without myalgias.  Blood pressure today 119/77. No further concerns at this time.   Update 08/05/2020 JM: [name] a [age].o. female with history of right hemispheric strokes with residual left mild spastic hemiparesis, mechanical heart valve-mitral valve on chronic Coumadin, and DM known [name] to this office for stroke follow-up with prior visit 01/28/2020 stable at that time but unfortunately presented to ED on 07/01/2020 with 3-day history of sudden onset dizziness with incidental finding of small left frontal lobe infarct embolic on warfarin for MV placement.  Intermittent lightheadedness/dizziness possibly BPPV with prior history of BPPV 2018.  Recommend continuation of aspirin and warfarin with INR goal 3.0-3.5 with INR on admission 2.9.  HTN stable.  LDL 75 and increase atorvastatin to 40 mg daily.  History of multiple prior strokes as listed below.  Evaluated by therapies and discharged to CIR for comprehensive rehab program. Since discharge, she reports increased spasticity of LUE and weakness in LLE and decline in ambulation.  She experienced similar to the setback after stroke extension in 08/2019.  She continues to work with neuro rehab PT and started OT yesterday.  She declines right-sided deficits.  No reoccurring dizziness for BPPV type symptoms.  Currently using a cane for ambulation and denies any recent falls. INR levels have been flucuating with recent INR level 3.1.  She does monitor at home - Dr. NaCathie Oldennd Coumadin clinic manages levels.  Remains on warfarin and aspirin without bleeding or bruising.  Remains on atorvastatin without myalgias.  Blood pressure today 115/69.  No concerns at this time.  Update 01/28/2020 JM: [name] is a 76 year old female who is being seen today, 01/28/2020, for stroke follow-up.  She has been stable from a stroke  standpoint with stable chronic left hemiparesis and subjective lack of sensation LLE.  She continues to work with neuro rehab PT with ongoing benefit.  Continues to use a cane outdoors and use of AFO brace but ambulates without AD in Gabrielle own home without falls.  Denies new or worsening stroke/TIA symptoms.  Continues on aspirin and warfarin without bleeding or bruising with recent INR level satisfactory at 3.1 with goal 3-3.5.  Continues on atorvastatin without myalgias.  Blood pressure today 118/70.  No concerns at this time.  Initial visit 10/22/2019 JM: [name] is a 76 year old female who is being seen today for hospital follow-up.  She continues to have mild left hemiparesis but has been experiencing issues with left hip pain and therapy believes possibly left hip bursitis and plans on evaluation by physical medicine rehab Dr. Letta Pate this Friday, 10/26/2019.  She continues to work with PT/OT with ongoing improvement.  Continues on aspirin and Coumadin without bleeding or bruising.  INR previously stable with levels remaining within goal range but today 2.9 with adjustment to Coumadin doses and ongoing follow-up with Coumadin clinic.  Continues on atorvastatin without myalgias.  Glucose levels stable.  Blood pressure today 102/71.  No further concerns   Stroke admission 09/03/2019: [name] is a 76 y.o. female with history of multiple strokess s/p craniotomy in the past presented on 09/03/2019 with increased hemiparesis of the left arm and leg.  Stroke work-up showed acute extension along the anterior margin of old infarct with unknown etiology possibly failure of collaterals and history of mechanical heart valve on long-term AC and INR suboptimal at 2.1.  MRI does confirm acute extension along the anterior margin of old infarction right parietal cortical and subcortical brain. Per Dr. Leonie Man during admission, "it is possible she had a transient hypoperfusion event as a new embolic event to the  same distribution as the previous stroke would be unlikely..  The strokes are relatively small, and given the high risk of mechanical heart valve, I think it is reasonable to continue anticoagulation".  CTA head/neck mild arthrosclerosis but no evidence of stenosis.  2D echo EF of 55% with abnormal septal motion due to postop effect, LA severely dilated, RA mild dilation, small pericardial effusion and mild calcification/thickening of aortic valve.  Previously on aspirin and Coumadin and recommended continuation with INR goal 3-3.5.  HTN stable.  LDL 65 and recommended continuation of atorvastatin 20 mg daily.  Controlled DM with A1c 6.1.  Other stroke risk factors include advanced age, prior history of stroke, CAD and valve replacement.  She was discharged to [facility] for ongoing therapy needs.   Update 08/10/2022 : [name] returns for follow-up after last with Janett Billow nurse practitioner 1 year ago.  She is accompanied by Massey.  She was admitted on 04/26/2022 with slurred speech and increased weakness and CT scan showed left greater than right parietal convexity subarachnoid hemorrhage.  [name] had stopped Gabrielle warfarin. As was scheduled follow-up elective hip joint injection for chronic pain hence was switched to Lovenox For Bridging.  [name] Was Treated Conservatively and Neurosurgery Was Consulted but Did Not Recommend Any Surgery.  Anticoagulation Was Resumed with Warfarin 3 Days Later.  [name] Was Transferred to Inpatient Rehab Where She Was  There for a Month and Made Gradual Improvement and Transferred on 06/01/2022 to [facility] .  [name] Is Making Gradual Improvement Currently Able to Stand up and Walk with Help from Physical Therapist using a walker.  She has general deconditioning but no increased physical weakness.  [name] has not had any significant cognitive decline either.  [name] has noticed some diminished fine motor skills in the right hand and trouble with handwriting.  [name]  remains on warfarin which is tolerating well and last INR yesterday was 3.6.  [name] has no new complaints today..   ROS:   14 system review of systems performed and negative with exception of those listed in HPI  PMH:  Past Medical History:  Diagnosis Date   Abnormality of gait 09/07/2016   Allergy    Diabetes mellitus without complication ([place])    [name] denies this - notes history of glucose intolerance   GERD (gastroesophageal reflux disease)    Hemiparesis and alteration of sensations as late effects of stroke ([place]) 09/07/2016   History of pneumonia 1997   Hypertension    S/P MVR (mitral valve replacement)    Mechanical mitral valve replacement at age 9 (done in [place])  // echo 7/17: EF 55-60, normal wall motion, bileaflet mechanical mitral valve prosthesis functioning normally, mild LAE, mildly reduced RVSF, small pericardial effusion   Stroke ([place]) 1997, 2013, 2015    PSH:  Past Surgical History:  Procedure Laterality Date   ABDOMINAL HYSTERECTOMY     [place]    Social History:  Social History   Socioeconomic History   Marital status: Married    Spouse name: Not on file   Number of children: 2   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   Highest education level: Not on file  Occupational History   Occupation: Retired  Tobacco Use   Smoking status: Never    Passive exposure: Never   Smokeless tobacco: Never  Vaping Use   Vaping Use: Never used  Substance and Sexual Activity   Alcohol use: No    Alcohol/week: 0.0 standard drinks of alcohol   Drug use: No   Sexual activity: Not Currently    Partners: Male    Comment: Married  Other Topics Concern   Not on file  Social History Narrative   Lives at home w/ [name]   Right-handed   Caffeine: none   Social Determinants of Health   Financial Resource Strain: Not on file  Food Insecurity: No Food Insecurity (07/18/2020)    Hunger Vital Sign    Worried About Running Out of Food in the Last Year: Never true    Ran Out of Food in the Last Year: Never true  Transportation Needs: No Transportation Needs (07/18/2020)   PRAPARE - Hydrologist (Medical): No    Lack of Transportation (Non-Medical): No  Physical Activity: Not on file  Stress: Not on file  Social Connections: Not on file  Intimate Partner Violence: Not on file    Family History:  Family History  Problem Relation Age of Onset   Cancer Mother        Bone   Heart disease Mother    Hyperlipidemia Mother    Hypertension Mother    Stroke Father    Hypertension Father    Heart attack Neg Hx  Medications:   Current Outpatient Medications on File Prior to Visit  Medication Sig Dispense Refill   acetaminophen (TYLENOL) 325 MG tablet Take 1-2 tablets (325-650 mg total) by mouth every 4 (four) hours as needed for mild pain.     amLODipine (NORVASC) 5 MG tablet Take 1 tablet (5 mg total) by mouth at bedtime. 30 tablet 0   atorvastatin (LIPITOR) 40 MG tablet Take 1 tablet (40 mg total) by mouth daily. 30 tablet 0   CALCIUM PO Take 1,200 mg by mouth daily.     Cholecalciferol (VITAMIN D3 PO) Take 1 tablet by mouth daily.     denosumab (PROLIA) 60 MG/ML SOSY injection Inject 60 mg into the skin every 6 (six) months.     docusate sodium (COLACE) 100 MG capsule Take 100 mg at bedtime by mouth.      methocarbamol (ROBAXIN) 500 MG tablet Take 1 tablet (500 mg total) by mouth 3 (three) times daily. 90 tablet 0   omeprazole (PRILOSEC) 20 MG capsule Take 1 capsule (20 mg total) by mouth daily. 30 capsule 0   Propylene Glycol-Glycerin (SOOTHE) 0.6-0.6 % SOLN Place 1 drop into both eyes 2 (two) times daily as needed (dry eyes).     Tapentadol HCl 100 MG TABS Take 1 tablet (100 mg total) by mouth every 6 (six) hours. 5 tablet 0   warfarin (COUMADIN) 2 MG tablet Take 1 tablet (2 mg total) by mouth as directed. Take 2 mg by mouth on  Sundays     warfarin (COUMADIN) 4 MG tablet Take 1 tablet (4 mg total) by mouth as directed. Take 4 mg by mouth on Mon, Tues, Wed, Thurs, Fri, Sat     No current facility-administered medications on file prior to visit.    Allergies:   Allergies  Allergen Reactions   Zoloft [Sertraline] Hives, Itching and Rash     Physical Exam  Vitals:   08/10/22 0853  BP: 116/75  Pulse: (!) 106    There is no height or weight on file to calculate BMI. No results found.  General: Frail, very pleasant elderly Caucasian female, seated, in no evident distress Head: head normocephalic and atraumatic.   Neck: supple with no carotid or supraclavicular bruits Cardiovascular: regular rate and rhythm, no murmurs; mechanical valve click Musculoskeletal: no deformity Skin:  no rash/petichiae Vascular:  Normal pulses all extremities   Neurologic Exam Mental Status: Awake and fully alert.   Fluent speech and language.  Oriented to place and time. Recent and remote memory intact. Attention span, concentration and fund of knowledge mildly diminished. Mood and affect appropriate.  Cranial Nerves: Pupils equal, briskly reactive to light. Extraocular movements with horizontal nystagmus with 2-3 beat left gaze and mild continuous right gaze -chronic per [name].  Visual fields full to confrontation. Hearing intact. Facial sensation intact. Face, tongue, palate moves normally and symmetrically.  Motor: Normal bulk and tone.  Chronic spastic left hemiparesis 4/5 and left foot drop  with increased tone LUE.  Diminished fine finger movements on the right.  Mild left upper extremity tremor. Sensory.: intact to touch , pinprick , position and vibratory sensation.  Coordination: Rapid alternating movements normal in all extremities except mildly decreased left hand. Finger-to-nose and heel-to-shin abnormal left side due to weakness and spasticity Gait and Station: Deferred as [name] is one-person assist did not bring  Gabrielle walker Reflexes: 2+ and asymmetric brisker on the left. Toes downgoing.       ASSESSMENT: KIYONA MCNALL is a 76  y.o. year old female with history of increased left hemiparesis on 09/03/2019 with stroke work-up showing acute extension along the anterior margin of old infarct secondary to unclear etiology with possible transient hypoperfusion. Admission on 07/01/2020 for dizziness likely BPPV with incidental finding of left frontal stroke.  Vascular risk factors include multiple prior strokes, HTN, HLD, mechanical heart valve on AC with INR goal 3-3.5, advanced age and CAD.   Recent prolonged admission in June 2023 secondary to biparietal subarachnoid hemorrhage related to anticoagulation with Lovenox.   PLAN:  I had a long d/w [name] and [name] about Gabrielle recent anticoagulation related subarachnoid hemorrhage, prior multiple strokes, mechanical heart valve, risk for recurrent stroke/TIAs, personally independently reviewed imaging studies and stroke evaluation results and answered questions.Continue warfarin daily with INR goal between 2.5-3.5 due to Gabrielle mechanical heart valve for secondary stroke prevention and maintain strict control of hypertension with blood pressure goal below 130/90, diabetes with hemoglobin A1c goal below 6.5% and lipids with LDL cholesterol goal below 70 mg/dL. I also advised the [name] to eat a healthy diet with plenty of whole grains, cereals, fruits and vegetables, exercise regularly and maintain ideal body weight .she was encouraged to continue to work with physical and Occupational Therapy and learn to walk with a walker followup in the future with me as needed and no scheduled appointment was made.  Greater than 50% time during this 35-minute visit was spent in counseling and coordination of care about Gabrielle recurrent strokes and recent subarachnoid hemorrhage discussion about stroke prevention.  Antony Contras, MD  [facility] [address] [place], [place] [address]  Phone [phone] Fax [phone] Note: This document was prepared with digital dictation and possible smart phrase technology. Any transcriptional errors that result from this process are unintentional.

## 2022-08-13 DIAGNOSIS — Z23 Encounter for immunization: Secondary | ICD-10-CM | POA: Diagnosis not present

## 2022-08-16 ENCOUNTER — Ambulatory Visit: Payer: Medicare Other | Admitting: Adult Health

## 2022-08-23 DIAGNOSIS — R2681 Unsteadiness on feet: Secondary | ICD-10-CM | POA: Diagnosis not present

## 2022-08-23 DIAGNOSIS — R42 Dizziness and giddiness: Secondary | ICD-10-CM | POA: Diagnosis not present

## 2022-08-23 DIAGNOSIS — R3911 Hesitancy of micturition: Secondary | ICD-10-CM | POA: Diagnosis not present

## 2022-08-23 DIAGNOSIS — F4323 Adjustment disorder with mixed anxiety and depressed mood: Secondary | ICD-10-CM | POA: Diagnosis not present

## 2022-08-23 DIAGNOSIS — Z7901 Long term (current) use of anticoagulants: Secondary | ICD-10-CM | POA: Diagnosis not present

## 2022-08-23 DIAGNOSIS — Z7689 Persons encountering health services in other specified circumstances: Secondary | ICD-10-CM | POA: Diagnosis not present

## 2022-08-23 DIAGNOSIS — N390 Urinary tract infection, site not specified: Secondary | ICD-10-CM | POA: Diagnosis not present

## 2022-08-23 DIAGNOSIS — Z952 Presence of prosthetic heart valve: Secondary | ICD-10-CM | POA: Diagnosis not present

## 2022-08-24 DIAGNOSIS — N390 Urinary tract infection, site not specified: Secondary | ICD-10-CM | POA: Diagnosis not present

## 2022-08-27 ENCOUNTER — Emergency Department (HOSPITAL_COMMUNITY): Payer: Medicare Other

## 2022-08-27 ENCOUNTER — Inpatient Hospital Stay (HOSPITAL_COMMUNITY)
Admission: EM | Admit: 2022-08-27 | Discharge: 2022-08-30 | DRG: 392 | Disposition: A | Discharge status: Skilled Nursing Facility | Payer: Medicare Other | Source: Skilled Nursing Facility | Attending: Internal Medicine | Admitting: Internal Medicine

## 2022-08-27 ENCOUNTER — Other Ambulatory Visit: Payer: Self-pay

## 2022-08-27 DIAGNOSIS — Z1152 Encounter for screening for COVID-19: Secondary | ICD-10-CM | POA: Diagnosis not present

## 2022-08-27 DIAGNOSIS — N281 Cyst of kidney, acquired: Secondary | ICD-10-CM | POA: Diagnosis not present

## 2022-08-27 DIAGNOSIS — R9431 Abnormal electrocardiogram [ECG] [EKG]: Secondary | ICD-10-CM | POA: Diagnosis present

## 2022-08-27 DIAGNOSIS — Z952 Presence of prosthetic heart valve: Secondary | ICD-10-CM | POA: Diagnosis not present

## 2022-08-27 DIAGNOSIS — Z823 Family history of stroke: Secondary | ICD-10-CM | POA: Diagnosis not present

## 2022-08-27 DIAGNOSIS — R1111 Vomiting without nausea: Secondary | ICD-10-CM | POA: Diagnosis not present

## 2022-08-27 DIAGNOSIS — K219 Gastro-esophageal reflux disease without esophagitis: Secondary | ICD-10-CM | POA: Diagnosis present

## 2022-08-27 DIAGNOSIS — R410 Disorientation, unspecified: Secondary | ICD-10-CM | POA: Diagnosis not present

## 2022-08-27 DIAGNOSIS — Z79899 Other long term (current) drug therapy: Secondary | ICD-10-CM | POA: Diagnosis not present

## 2022-08-27 DIAGNOSIS — R402431 Glasgow coma scale score 3-8, in the field [EMT or ambulance]: Secondary | ICD-10-CM | POA: Diagnosis not present

## 2022-08-27 DIAGNOSIS — F0390 Unspecified dementia without behavioral disturbance: Secondary | ICD-10-CM | POA: Diagnosis present

## 2022-08-27 DIAGNOSIS — Z66 Do not resuscitate: Secondary | ICD-10-CM | POA: Diagnosis not present

## 2022-08-27 DIAGNOSIS — R7989 Other specified abnormal findings of blood chemistry: Secondary | ICD-10-CM | POA: Diagnosis present

## 2022-08-27 DIAGNOSIS — E785 Hyperlipidemia, unspecified: Secondary | ICD-10-CM | POA: Diagnosis not present

## 2022-08-27 DIAGNOSIS — K5900 Constipation, unspecified: Secondary | ICD-10-CM | POA: Diagnosis not present

## 2022-08-27 DIAGNOSIS — I6381 Other cerebral infarction due to occlusion or stenosis of small artery: Secondary | ICD-10-CM | POA: Diagnosis not present

## 2022-08-27 DIAGNOSIS — R112 Nausea with vomiting, unspecified: Principal | ICD-10-CM

## 2022-08-27 DIAGNOSIS — E876 Hypokalemia: Secondary | ICD-10-CM | POA: Diagnosis not present

## 2022-08-27 DIAGNOSIS — R2681 Unsteadiness on feet: Secondary | ICD-10-CM | POA: Diagnosis not present

## 2022-08-27 DIAGNOSIS — Z7401 Bed confinement status: Secondary | ICD-10-CM | POA: Diagnosis not present

## 2022-08-27 DIAGNOSIS — R55 Syncope and collapse: Secondary | ICD-10-CM | POA: Diagnosis not present

## 2022-08-27 DIAGNOSIS — R7303 Prediabetes: Secondary | ICD-10-CM | POA: Diagnosis present

## 2022-08-27 DIAGNOSIS — I69354 Hemiplegia and hemiparesis following cerebral infarction affecting left non-dominant side: Secondary | ICD-10-CM | POA: Diagnosis not present

## 2022-08-27 DIAGNOSIS — K2900 Acute gastritis without bleeding: Secondary | ICD-10-CM | POA: Diagnosis not present

## 2022-08-27 DIAGNOSIS — R531 Weakness: Secondary | ICD-10-CM | POA: Diagnosis not present

## 2022-08-27 DIAGNOSIS — R404 Transient alteration of awareness: Secondary | ICD-10-CM | POA: Diagnosis not present

## 2022-08-27 DIAGNOSIS — R4182 Altered mental status, unspecified: Secondary | ICD-10-CM | POA: Diagnosis not present

## 2022-08-27 DIAGNOSIS — Z888 Allergy status to other drugs, medicaments and biological substances status: Secondary | ICD-10-CM

## 2022-08-27 DIAGNOSIS — M6281 Muscle weakness (generalized): Secondary | ICD-10-CM | POA: Diagnosis not present

## 2022-08-27 DIAGNOSIS — K2970 Gastritis, unspecified, without bleeding: Secondary | ICD-10-CM

## 2022-08-27 DIAGNOSIS — I609 Nontraumatic subarachnoid hemorrhage, unspecified: Secondary | ICD-10-CM | POA: Diagnosis not present

## 2022-08-27 DIAGNOSIS — D649 Anemia, unspecified: Secondary | ICD-10-CM | POA: Diagnosis not present

## 2022-08-27 DIAGNOSIS — Z7901 Long term (current) use of anticoagulants: Secondary | ICD-10-CM

## 2022-08-27 DIAGNOSIS — K529 Noninfective gastroenteritis and colitis, unspecified: Secondary | ICD-10-CM | POA: Diagnosis not present

## 2022-08-27 DIAGNOSIS — Z8249 Family history of ischemic heart disease and other diseases of the circulatory system: Secondary | ICD-10-CM

## 2022-08-27 DIAGNOSIS — K6389 Other specified diseases of intestine: Secondary | ICD-10-CM | POA: Diagnosis not present

## 2022-08-27 DIAGNOSIS — I10 Essential (primary) hypertension: Secondary | ICD-10-CM | POA: Diagnosis not present

## 2022-08-27 DIAGNOSIS — R2689 Other abnormalities of gait and mobility: Secondary | ICD-10-CM | POA: Diagnosis not present

## 2022-08-27 DIAGNOSIS — A084 Viral intestinal infection, unspecified: Secondary | ICD-10-CM | POA: Diagnosis not present

## 2022-08-27 DIAGNOSIS — R000 Tachycardia, unspecified: Secondary | ICD-10-CM | POA: Diagnosis not present

## 2022-08-27 DIAGNOSIS — R1110 Vomiting, unspecified: Secondary | ICD-10-CM | POA: Diagnosis not present

## 2022-08-27 DIAGNOSIS — I6930 Unspecified sequelae of cerebral infarction: Secondary | ICD-10-CM

## 2022-08-27 DIAGNOSIS — Z83438 Family history of other disorder of lipoprotein metabolism and other lipidemia: Secondary | ICD-10-CM | POA: Diagnosis not present

## 2022-08-27 DIAGNOSIS — R5383 Other fatigue: Secondary | ICD-10-CM | POA: Diagnosis not present

## 2022-08-27 LAB — CBC WITH DIFFERENTIAL/PLATELET
Abs Immature Granulocytes: 0.02 10*3/uL (ref 0.00–0.07)
Basophils Absolute: 0 10*3/uL (ref 0.0–0.1)
Basophils Relative: 0 %
Eosinophils Absolute: 0 10*3/uL (ref 0.0–0.5)
Eosinophils Relative: 0 %
HCT: 37 % (ref 36.0–46.0)
Hemoglobin: 11.7 g/dL — ABNORMAL LOW (ref 12.0–15.0)
Immature Granulocytes: 0 %
Lymphocytes Relative: 14 %
Lymphs Abs: 1 10*3/uL (ref 0.7–4.0)
MCH: 26.5 pg (ref 26.0–34.0)
MCHC: 31.6 g/dL (ref 30.0–36.0)
MCV: 83.7 fL (ref 80.0–100.0)
Monocytes Absolute: 0.3 10*3/uL (ref 0.1–1.0)
Monocytes Relative: 5 %
Neutro Abs: 5.4 10*3/uL (ref 1.7–7.7)
Neutrophils Relative %: 81 %
Platelets: 194 10*3/uL (ref 150–400)
RBC: 4.42 MIL/uL (ref 3.87–5.11)
RDW: 15.2 % (ref 11.5–15.5)
WBC: 6.7 10*3/uL (ref 4.0–10.5)
nRBC: 0 % (ref 0.0–0.2)

## 2022-08-27 LAB — PROTIME-INR
INR: 3.8 — ABNORMAL HIGH (ref 0.8–1.2)
Prothrombin Time: 36.9 seconds — ABNORMAL HIGH (ref 11.4–15.2)

## 2022-08-27 LAB — CBG MONITORING, ED: Glucose-Capillary: 160 mg/dL — ABNORMAL HIGH (ref 70–99)

## 2022-08-27 LAB — TROPONIN I (HIGH SENSITIVITY)
Troponin I (High Sensitivity): 81 ng/L — ABNORMAL HIGH (ref <18)
Troponin I (High Sensitivity): 82 ng/L — ABNORMAL HIGH (ref <18)

## 2022-08-27 LAB — COMPREHENSIVE METABOLIC PANEL
ALT: 15 U/L (ref 0–44)
AST: 18 U/L (ref 15–41)
Albumin: 3.6 g/dL (ref 3.5–5.0)
Alkaline Phosphatase: 95 U/L (ref 38–126)
Anion gap: 12 (ref 5–15)
BUN: 18 mg/dL (ref 8–23)
CO2: 28 mmol/L (ref 22–32)
Calcium: 9.8 mg/dL (ref 8.9–10.3)
Chloride: 99 mmol/L (ref 98–111)
Creatinine, Ser: 0.84 mg/dL (ref 0.44–1.00)
GFR, Estimated: >60 mL/min (ref >60)
Glucose, Bld: 181 mg/dL — ABNORMAL HIGH (ref 70–99)
Potassium: 3.4 mmol/L — ABNORMAL LOW (ref 3.5–5.1)
Sodium: 139 mmol/L (ref 135–145)
Total Bilirubin: 0.7 mg/dL (ref 0.3–1.2)
Total Protein: 6.3 g/dL — ABNORMAL LOW (ref 6.5–8.1)

## 2022-08-27 LAB — RESP PANEL BY RT-PCR (FLU A&B, COVID) ARPGX2
Influenza A by PCR: NEGATIVE
Influenza B by PCR: NEGATIVE
SARS Coronavirus 2 by RT PCR: NEGATIVE

## 2022-08-27 LAB — LACTIC ACID, PLASMA: Lactic Acid, Venous: 2 mmol/L (ref 0.5–1.9)

## 2022-08-27 LAB — AMMONIA: Ammonia: 27 umol/L (ref 9–35)

## 2022-08-27 LAB — LIPASE, BLOOD: Lipase: 28 U/L (ref 11–51)

## 2022-08-27 MED ORDER — IOHEXOL 350 MG/ML SOLN
75.0000 mL | Freq: Once | INTRAVENOUS | Status: AC | PRN
  Administered 2022-08-27: 75 mL via INTRAVENOUS

## 2022-08-27 MED ORDER — SODIUM CHLORIDE 0.9 % IV BOLUS
1000.0000 mL | Freq: Once | INTRAVENOUS | Status: AC
  Administered 2022-08-27: 1000 mL via INTRAVENOUS

## 2022-08-27 NOTE — ED Provider Notes (Signed)
[facility] EMERGENCY DEPARTMENT Provider Note   CSN: [id number] Arrival date & time: 08/27/22  2038     History {Add pertinent medical, surgical, social history, OB history to HPI:1} Chief Complaint  Patient presents with  . Altered Mental Status    [name] is a 76 y.o. female of multiple strokes with residual left-sided weakness (L brain infarct 02/1996, R brain infarct with hemorrhagic transformation 06/1996, left cerebellar infarct 07/2013, right MCA stroke 01/2014, acute extension of right MCA stroke 08/2019, left frontal lobe stroke 06/2020), BPPV, HTN, HLD, mitral valve replacement maintained on chronic Coumadin and history of craniotomy who presents from [facility] with AMS. Per EMS, patient has been vomiting all day. She was last seen at her baseline at 6:00 pm/ Patient is normally verbal and communicative.  Now she appears lethargic, weak, and minimally responsive.   History provided by: ems.  Altered Mental Status Presenting symptoms: partial responsiveness   Severity:  Severe Most recent episode:  Today Duration:  3 hours Timing:  Constant Progression:  Unchanged Chronicity:  New Context: dementia and nursing home resident   Associated symptoms: vomiting        Home Medications Prior to Admission medications   Medication Sig Start Date End Date Taking? Authorizing Provider  acetaminophen (TYLENOL) 325 MG tablet Take 1-2 tablets (325-650 mg total) by mouth every 4 (four) hours as needed for mild pain. 05/26/22   Angiulli, Lavon Paganini, PA-C  amLODipine (NORVASC) 5 MG tablet Take 1 tablet (5 mg total) by mouth at bedtime. 05/26/22   Angiulli, Lavon Paganini, PA-C  atorvastatin (LIPITOR) 40 MG tablet Take 1 tablet (40 mg total) by mouth daily. 05/26/22   Angiulli, Lavon Paganini, PA-C  CALCIUM PO Take 1,200 mg by mouth daily.    [provider]  Cholecalciferol (VITAMIN D3 PO) Take 1 tablet by mouth daily.    [provider]  denosumab (PROLIA)  60 MG/ML SOSY injection Inject 60 mg into the skin every 6 (six) months.    [provider]  docusate sodium (COLACE) 100 MG capsule Take 100 mg at bedtime by mouth.     [provider]  methocarbamol (ROBAXIN) 500 MG tablet Take 1 tablet (500 mg total) by mouth 3 (three) times daily. 05/26/22   Angiulli, Lavon Paganini, PA-C  omeprazole (PRILOSEC) 20 MG capsule Take 1 capsule (20 mg total) by mouth daily. 05/26/22   Angiulli, Lavon Paganini, PA-C  Propylene Glycol-Glycerin (SOOTHE) 0.6-0.6 % SOLN Place 1 drop into both eyes 2 (two) times daily as needed (dry eyes).    [provider]  Tapentadol HCl 100 MG TABS Take 1 tablet (100 mg total) by mouth every 6 (six) hours. 05/31/22   Angiulli, Lavon Paganini, PA-C  warfarin (COUMADIN) 2 MG tablet Take 1 tablet (2 mg total) by mouth as directed. Take 2 mg by mouth on Sundays 06/01/22   Angiulli, Lavon Paganini, PA-C  warfarin (COUMADIN) 4 MG tablet Take 1 tablet (4 mg total) by mouth as directed. Take 4 mg by mouth on Mon, Tues, Wed, Thurs, Fri, Sat 06/01/22   Angiulli, Lavon Paganini, PA-C      Allergies    Zoloft [sertraline]    Review of Systems   Review of Systems  Gastrointestinal:  Positive for vomiting.    Physical Exam Updated Vital Signs BP 135/72 (BP Location: Right Arm)   Pulse 90   Resp 18   SpO2 96%  Physical Exam Vitals and nursing note reviewed.  Constitutional:  General: She is not in acute distress.    Appearance: She is well-developed. She is not diaphoretic.  HENT:     Head: Normocephalic and atraumatic.     Right Ear: External ear normal.     Left Ear: External ear normal.     Nose: Nose normal.  Eyes:     General: No scleral icterus.    Conjunctiva/sclera: Conjunctivae normal.     Pupils: Pupils are equal, round, and reactive to light.  Cardiovascular:     Rate and Rhythm: Normal rate and regular rhythm.     Heart sounds: Normal heart sounds. No murmur heard.    No friction rub. No gallop.  Pulmonary:      Effort: Pulmonary effort is normal. No respiratory distress.     Breath sounds: Normal breath sounds.  Abdominal:     General: Bowel sounds are normal. There is no distension.     Palpations: Abdomen is soft. There is no mass.     Tenderness: There is no abdominal tenderness. There is no guarding.  Musculoskeletal:     Cervical back: Normal range of motion.  Skin:    General: Skin is warm and dry.  Neurological:     Mental Status: She is lethargic.     GCS: GCS eye subscore is 2. GCS verbal subscore is 4. GCS motor subscore is 5.     Comments: Patient was able to weakly mouth her name "[name]"  Disconjugate gaze with Right eye lateral deviation  Psychiatric:        Behavior: Behavior normal.    ED Results / Procedures / Treatments   Labs (all labs ordered are listed, but only abnormal results are displayed) Labs Reviewed  CULTURE, BLOOD (ROUTINE X 2)  CULTURE, BLOOD (ROUTINE X 2)  RESP PANEL BY RT-PCR (FLU A&B, COVID) ARPGX2  COMPREHENSIVE METABOLIC PANEL  CBC WITH DIFFERENTIAL/PLATELET  URINALYSIS, ROUTINE W REFLEX MICROSCOPIC  AMMONIA  LACTIC ACID, PLASMA  LIPASE, BLOOD  CBG MONITORING, ED  TROPONIN I (HIGH SENSITIVITY)    EKG None  Radiology No results found.  Procedures Procedures  {Document cardiac monitor, telemetry assessment procedure when appropriate:1}  Medications Ordered in ED Medications - No data to display  ED Course/ Medical Decision Making/ A&P Clinical Course as of 08/28/22 0032  Fri Aug 27, 2022  2231 INR(!): 3.8 [AH]  2232 Lactic Acid, Venous(!!): 2.0 [AH]  2233 Troponin I (High Sensitivity)(!): 81 [AH]  2233 Hemoglobin(!): 11.7 [AH]  2233 WBC: 6.7 [AH]  2233 Glucose(!): 181 [AH]  2233 Anion gap: 12 [AH]  Sat Aug 28, 2022  0019 I reevaluated patient.  She is still lethargic but improved, respond.  She still feels extremely weak and thirsty. Nausea is present but improved. Denies abdominal pain. On exam she has nystagmus. Unsure if this  is baseline. She appears [AH]    Clinical Course User Index [AH] Margarita Mail, PA-C                           Medical Decision Making Amount and/or Complexity of Data Reviewed Labs: ordered. Radiology: ordered. ECG/medicine tests: ordered.   ***  {Document critical care time when appropriate:1} {Document review of labs and clinical decision tools ie heart score, Chads2Vasc2 etc:1}  {Document your independent review of radiology images, and any outside records:1} {Document your discussion with family members, caretakers, and with consultants:1} {Document social determinants of health affecting pt's care:1} {Document your decision making why or why  not admission, treatments were needed:1} Final Clinical Impression(s) / ED Diagnoses Final diagnoses:  None    Rx / DC Orders ED Discharge Orders     None

## 2022-08-27 NOTE — ED Triage Notes (Signed)
Pt here from [facility] for [place]. Per staff pt was last seen at baseline at 1800 but has had N/V all day, usually aox3 answers yes/no questions. Upon ems arrival pt was responsive to pain. Ems gave 24mo NS and '4mg'$  zofran after pt had episode of emesis. 20g R hand, cbg 216, 132/84, 92HR, 91% on RA

## 2022-08-27 NOTE — ED Provider Notes (Incomplete)
[place] EMERGENCY DEPARTMENT Provider Note   CSN: [id number] Arrival date & time: 08/27/22  2038     History {Add pertinent medical, surgical, social history, OB history to HPI:1} Chief Complaint  Patient presents with  . Altered Mental Status    [name] is a 76 y.o. female with pmh SAH, with R sided deficits presents from [facility] with AMS. Per EMS, patient has been vomiting all day. She was last seen at her baseline at 6:00 pm/ Patient is normally verbal and communicative.  Now she appears lethargic, weak, and minimally responsive.   History provided by: ems.  Altered Mental Status Presenting symptoms: partial responsiveness   Severity:  Severe Most recent episode:  Today Duration:  3 hours Timing:  Constant Progression:  Unchanged Chronicity:  New Context: dementia and nursing home resident   Associated symptoms: vomiting        Home Medications Prior to Admission medications   Medication Sig Start Date End Date Taking? Authorizing Provider  acetaminophen (TYLENOL) 325 MG tablet Take 1-2 tablets (325-650 mg total) by mouth every 4 (four) hours as needed for mild pain. 05/26/22   Angiulli, Lavon Paganini, PA-C  amLODipine (NORVASC) 5 MG tablet Take 1 tablet (5 mg total) by mouth at bedtime. 05/26/22   Angiulli, Lavon Paganini, PA-C  atorvastatin (LIPITOR) 40 MG tablet Take 1 tablet (40 mg total) by mouth daily. 05/26/22   Angiulli, Lavon Paganini, PA-C  CALCIUM PO Take 1,200 mg by mouth daily.    [provider]  Cholecalciferol (VITAMIN D3 PO) Take 1 tablet by mouth daily.    [provider]  denosumab (PROLIA) 60 MG/ML SOSY injection Inject 60 mg into the skin every 6 (six) months.    [provider]  docusate sodium (COLACE) 100 MG capsule Take 100 mg at bedtime by mouth.     [provider]  methocarbamol (ROBAXIN) 500 MG tablet Take 1 tablet (500 mg total) by mouth 3 (three) times daily. 05/26/22   Angiulli, Lavon Paganini,  PA-C  omeprazole (PRILOSEC) 20 MG capsule Take 1 capsule (20 mg total) by mouth daily. 05/26/22   Angiulli, Lavon Paganini, PA-C  Propylene Glycol-Glycerin (SOOTHE) 0.6-0.6 % SOLN Place 1 drop into both eyes 2 (two) times daily as needed (dry eyes).    [provider]  Tapentadol HCl 100 MG TABS Take 1 tablet (100 mg total) by mouth every 6 (six) hours. 05/31/22   Angiulli, Lavon Paganini, PA-C  warfarin (COUMADIN) 2 MG tablet Take 1 tablet (2 mg total) by mouth as directed. Take 2 mg by mouth on Sundays 06/01/22   Angiulli, Lavon Paganini, PA-C  warfarin (COUMADIN) 4 MG tablet Take 1 tablet (4 mg total) by mouth as directed. Take 4 mg by mouth on Mon, Tues, Wed, Thurs, Fri, Sat 06/01/22   Angiulli, Lavon Paganini, PA-C      Allergies    Zoloft [sertraline]    Review of Systems   Review of Systems  Gastrointestinal:  Positive for vomiting.    Physical Exam Updated Vital Signs BP 135/72 (BP Location: Right Arm)   Pulse 90   Resp 18   SpO2 96%  Physical Exam Vitals and nursing note reviewed.  Constitutional:      General: She is not in acute distress.    Appearance: She is well-developed. She is not diaphoretic.  HENT:     Head: Normocephalic and atraumatic.     Right Ear: External ear normal.  Left Ear: External ear normal.     Nose: Nose normal.  Eyes:     General: No scleral icterus.    Conjunctiva/sclera: Conjunctivae normal.     Pupils: Pupils are equal, round, and reactive to light.  Cardiovascular:     Rate and Rhythm: Normal rate and regular rhythm.     Heart sounds: Normal heart sounds. No murmur heard.    No friction rub. No gallop.  Pulmonary:     Effort: Pulmonary effort is normal. No respiratory distress.     Breath sounds: Normal breath sounds.  Abdominal:     General: Bowel sounds are normal. There is no distension.     Palpations: Abdomen is soft. There is no mass.     Tenderness: There is no abdominal tenderness. There is no guarding.  Musculoskeletal:     Cervical  back: Normal range of motion.  Skin:    General: Skin is warm and dry.  Neurological:     Mental Status: She is lethargic.     GCS: GCS eye subscore is 2. GCS verbal subscore is 4. GCS motor subscore is 5.     Comments: Patient was able to weakly mouth her name "[name]"  Disconjugate gaze with Right eye lateral deviation  Psychiatric:        Behavior: Behavior normal.     ED Results / Procedures / Treatments   Labs (all labs ordered are listed, but only abnormal results are displayed) Labs Reviewed  CULTURE, BLOOD (ROUTINE X 2)  CULTURE, BLOOD (ROUTINE X 2)  RESP PANEL BY RT-PCR (FLU A&B, COVID) ARPGX2  COMPREHENSIVE METABOLIC PANEL  CBC WITH DIFFERENTIAL/PLATELET  URINALYSIS, ROUTINE W REFLEX MICROSCOPIC  AMMONIA  LACTIC ACID, PLASMA  LIPASE, BLOOD  CBG MONITORING, ED  TROPONIN I (HIGH SENSITIVITY)    EKG None  Radiology No results found.  Procedures Procedures  {Document cardiac monitor, telemetry assessment procedure when appropriate:1}  Medications Ordered in ED Medications - No data to display  ED Course/ Medical Decision Making/ A&P                           Medical Decision Making Amount and/or Complexity of Data Reviewed Labs: ordered. Radiology: ordered. ECG/medicine tests: ordered.   ***  {Document critical care time when appropriate:1} {Document review of labs and clinical decision tools ie heart score, Chads2Vasc2 etc:1}  {Document your independent review of radiology images, and any outside records:1} {Document your discussion with family members, caretakers, and with consultants:1} {Document social determinants of health affecting pt's care:1} {Document your decision making why or why not admission, treatments were needed:1} Final Clinical Impression(s) / ED Diagnoses Final diagnoses:  None    Rx / DC Orders ED Discharge Orders     None

## 2022-08-28 DIAGNOSIS — K2900 Acute gastritis without bleeding: Secondary | ICD-10-CM | POA: Diagnosis not present

## 2022-08-28 DIAGNOSIS — Z79899 Other long term (current) drug therapy: Secondary | ICD-10-CM | POA: Diagnosis not present

## 2022-08-28 DIAGNOSIS — F0390 Unspecified dementia without behavioral disturbance: Secondary | ICD-10-CM | POA: Diagnosis present

## 2022-08-28 DIAGNOSIS — R531 Weakness: Secondary | ICD-10-CM | POA: Diagnosis not present

## 2022-08-28 DIAGNOSIS — I10 Essential (primary) hypertension: Secondary | ICD-10-CM | POA: Diagnosis present

## 2022-08-28 DIAGNOSIS — Z83438 Family history of other disorder of lipoprotein metabolism and other lipidemia: Secondary | ICD-10-CM | POA: Diagnosis not present

## 2022-08-28 DIAGNOSIS — R7303 Prediabetes: Secondary | ICD-10-CM

## 2022-08-28 DIAGNOSIS — A084 Viral intestinal infection, unspecified: Secondary | ICD-10-CM | POA: Diagnosis present

## 2022-08-28 DIAGNOSIS — R410 Disorientation, unspecified: Secondary | ICD-10-CM | POA: Diagnosis not present

## 2022-08-28 DIAGNOSIS — E876 Hypokalemia: Secondary | ICD-10-CM | POA: Diagnosis present

## 2022-08-28 DIAGNOSIS — D649 Anemia, unspecified: Secondary | ICD-10-CM | POA: Diagnosis present

## 2022-08-28 DIAGNOSIS — Z952 Presence of prosthetic heart valve: Secondary | ICD-10-CM | POA: Diagnosis not present

## 2022-08-28 DIAGNOSIS — K5900 Constipation, unspecified: Secondary | ICD-10-CM

## 2022-08-28 DIAGNOSIS — K219 Gastro-esophageal reflux disease without esophagitis: Secondary | ICD-10-CM | POA: Diagnosis present

## 2022-08-28 DIAGNOSIS — Z7401 Bed confinement status: Secondary | ICD-10-CM | POA: Diagnosis not present

## 2022-08-28 DIAGNOSIS — I6930 Unspecified sequelae of cerebral infarction: Secondary | ICD-10-CM

## 2022-08-28 DIAGNOSIS — I69354 Hemiplegia and hemiparesis following cerebral infarction affecting left non-dominant side: Secondary | ICD-10-CM | POA: Diagnosis not present

## 2022-08-28 DIAGNOSIS — R404 Transient alteration of awareness: Secondary | ICD-10-CM | POA: Diagnosis present

## 2022-08-28 DIAGNOSIS — Z1152 Encounter for screening for COVID-19: Secondary | ICD-10-CM | POA: Diagnosis not present

## 2022-08-28 DIAGNOSIS — K529 Noninfective gastroenteritis and colitis, unspecified: Secondary | ICD-10-CM

## 2022-08-28 DIAGNOSIS — Z7901 Long term (current) use of anticoagulants: Secondary | ICD-10-CM | POA: Diagnosis not present

## 2022-08-28 DIAGNOSIS — R7989 Other specified abnormal findings of blood chemistry: Secondary | ICD-10-CM | POA: Diagnosis present

## 2022-08-28 DIAGNOSIS — Z823 Family history of stroke: Secondary | ICD-10-CM | POA: Diagnosis not present

## 2022-08-28 DIAGNOSIS — Z888 Allergy status to other drugs, medicaments and biological substances status: Secondary | ICD-10-CM | POA: Diagnosis not present

## 2022-08-28 DIAGNOSIS — R9431 Abnormal electrocardiogram [ECG] [EKG]: Secondary | ICD-10-CM | POA: Diagnosis present

## 2022-08-28 DIAGNOSIS — K2970 Gastritis, unspecified, without bleeding: Secondary | ICD-10-CM

## 2022-08-28 DIAGNOSIS — Z66 Do not resuscitate: Secondary | ICD-10-CM | POA: Diagnosis present

## 2022-08-28 DIAGNOSIS — E785 Hyperlipidemia, unspecified: Secondary | ICD-10-CM | POA: Diagnosis present

## 2022-08-28 DIAGNOSIS — Z8249 Family history of ischemic heart disease and other diseases of the circulatory system: Secondary | ICD-10-CM | POA: Diagnosis not present

## 2022-08-28 DIAGNOSIS — R4182 Altered mental status, unspecified: Secondary | ICD-10-CM | POA: Diagnosis present

## 2022-08-28 LAB — BASIC METABOLIC PANEL
Anion gap: 8 (ref 5–15)
BUN: 19 mg/dL (ref 8–23)
CO2: 27 mmol/L (ref 22–32)
Calcium: 9.6 mg/dL (ref 8.9–10.3)
Chloride: 104 mmol/L (ref 98–111)
Creatinine, Ser: 0.71 mg/dL (ref 0.44–1.00)
GFR, Estimated: >60 mL/min (ref >60)
Glucose, Bld: 168 mg/dL — ABNORMAL HIGH (ref 70–99)
Potassium: 4.1 mmol/L (ref 3.5–5.1)
Sodium: 139 mmol/L (ref 135–145)

## 2022-08-28 LAB — CBC
HCT: 33.3 % — ABNORMAL LOW (ref 36.0–46.0)
Hemoglobin: 11 g/dL — ABNORMAL LOW (ref 12.0–15.0)
MCH: 27.4 pg (ref 26.0–34.0)
MCHC: 33 g/dL (ref 30.0–36.0)
MCV: 82.8 fL (ref 80.0–100.0)
Platelets: 176 10*3/uL (ref 150–400)
RBC: 4.02 MIL/uL (ref 3.87–5.11)
RDW: 15.3 % (ref 11.5–15.5)
WBC: 6.4 10*3/uL (ref 4.0–10.5)
nRBC: 0 % (ref 0.0–0.2)

## 2022-08-28 LAB — PROTIME-INR
INR: 4.7 (ref 0.8–1.2)
Prothrombin Time: 43.9 seconds — ABNORMAL HIGH (ref 11.4–15.2)

## 2022-08-28 LAB — MAGNESIUM: Magnesium: 1.7 mg/dL (ref 1.7–2.4)

## 2022-08-28 LAB — LACTIC ACID, PLASMA
Lactic Acid, Venous: 1.3 mmol/L (ref 0.5–1.9)
Lactic Acid, Venous: 0.9 mmol/L (ref 0.5–1.9)

## 2022-08-28 MED ORDER — PANTOPRAZOLE SODIUM 40 MG IV SOLR
40.0000 mg | Freq: Every day | INTRAVENOUS | Status: DC
  Administered 2022-08-28 – 2022-08-30 (×3): 40 mg via INTRAVENOUS
  Filled 2022-08-28 (×4): qty 10

## 2022-08-28 MED ORDER — MAGNESIUM SULFATE 2 GM/50ML IV SOLN
2.0000 g | Freq: Once | INTRAVENOUS | Status: AC
  Administered 2022-08-28: 2 g via INTRAVENOUS
  Filled 2022-08-28: qty 50

## 2022-08-28 MED ORDER — SENNOSIDES-DOCUSATE SODIUM 8.6-50 MG PO TABS
1.0000 | ORAL_TABLET | Freq: Two times a day (BID) | ORAL | Status: DC
  Administered 2022-08-29 – 2022-08-30 (×3): 1 via ORAL
  Filled 2022-08-28 (×3): qty 1

## 2022-08-28 MED ORDER — BISACODYL 10 MG RE SUPP
10.0000 mg | Freq: Once | RECTAL | Status: AC
  Administered 2022-08-28: 10 mg via RECTAL
  Filled 2022-08-28: qty 1

## 2022-08-28 MED ORDER — VITAMIN D 25 MCG (1000 UNIT) PO TABS
1000.0000 [IU] | ORAL_TABLET | Freq: Every morning | ORAL | Status: DC
  Administered 2022-08-29 – 2022-08-30 (×2): 1000 [IU] via ORAL
  Filled 2022-08-28 (×2): qty 1

## 2022-08-28 MED ORDER — PROSOURCE PLUS PO LIQD
30.0000 mL | Freq: Every day | ORAL | Status: DC
  Administered 2022-08-29 – 2022-08-30 (×2): 30 mL via ORAL
  Filled 2022-08-28 (×3): qty 30

## 2022-08-28 MED ORDER — POTASSIUM CHLORIDE 10 MEQ/100ML IV SOLN
10.0000 meq | Freq: Once | INTRAVENOUS | Status: AC
  Administered 2022-08-28: 10 meq via INTRAVENOUS
  Filled 2022-08-28: qty 100

## 2022-08-28 MED ORDER — ATORVASTATIN CALCIUM 40 MG PO TABS
40.0000 mg | ORAL_TABLET | Freq: Every day | ORAL | Status: DC
  Administered 2022-08-29 – 2022-08-30 (×2): 40 mg via ORAL
  Filled 2022-08-28 (×2): qty 1

## 2022-08-28 MED ORDER — SODIUM CHLORIDE 0.9 % IV SOLN: INTRAVENOUS | Status: DC

## 2022-08-28 MED ORDER — POLYETHYLENE GLYCOL 3350 17 G PO PACK
17.0000 g | PACK | Freq: Two times a day (BID) | ORAL | Status: DC
  Administered 2022-08-29 – 2022-08-30 (×3): 17 g via ORAL
  Filled 2022-08-28 (×3): qty 1

## 2022-08-28 MED ORDER — WARFARIN - PHARMACIST DOSING INPATIENT: Freq: Every day | Status: DC

## 2022-08-28 MED ORDER — PRO-STAT SUGAR FREE PO LIQD
30.0000 mL | Freq: Every day | ORAL | Status: DC
  Filled 2022-08-28: qty 30

## 2022-08-28 MED ORDER — BISACODYL 10 MG RE SUPP: 10.0000 mg | Freq: Every day | RECTAL | Status: DC | PRN

## 2022-08-28 MED ORDER — SODIUM CHLORIDE 0.9 % IV SOLN: INTRAVENOUS | Status: AC

## 2022-08-28 MED ORDER — MILK AND MOLASSES ENEMA
1.0000 | Freq: Once | RECTAL | Status: AC
  Administered 2022-08-28: 240 mL via RECTAL
  Filled 2022-08-28: qty 240

## 2022-08-28 MED ORDER — OYSTER SHELL CALCIUM/D3 500-5 MG-MCG PO TABS
1.0000 | ORAL_TABLET | Freq: Every morning | ORAL | Status: DC
  Administered 2022-08-30: 1 via ORAL
  Filled 2022-08-28 (×3): qty 1

## 2022-08-28 MED ORDER — AMLODIPINE BESYLATE 5 MG PO TABS
5.0000 mg | ORAL_TABLET | Freq: Every morning | ORAL | Status: DC
  Administered 2022-08-29 – 2022-08-30 (×2): 5 mg via ORAL
  Filled 2022-08-28 (×2): qty 1

## 2022-08-28 MED ORDER — PAROXETINE HCL 10 MG PO TABS
10.0000 mg | ORAL_TABLET | Freq: Every morning | ORAL | Status: DC
  Administered 2022-08-29 – 2022-08-30 (×2): 10 mg via ORAL
  Filled 2022-08-28 (×5): qty 1

## 2022-08-28 MED ORDER — ONDANSETRON HCL 4 MG/2ML IJ SOLN
4.0000 mg | Freq: Four times a day (QID) | INTRAMUSCULAR | Status: DC | PRN
  Administered 2022-08-29 – 2022-08-30 (×3): 4 mg via INTRAVENOUS
  Filled 2022-08-28 (×4): qty 2

## 2022-08-28 MED ORDER — NITROFURANTOIN MONOHYD MACRO 100 MG PO CAPS
100.0000 mg | ORAL_CAPSULE | Freq: Two times a day (BID) | ORAL | Status: AC
  Administered 2022-08-29 (×2): 100 mg via ORAL
  Filled 2022-08-28 (×5): qty 1

## 2022-08-28 MED ORDER — METHOCARBAMOL 500 MG PO TABS
500.0000 mg | ORAL_TABLET | Freq: Three times a day (TID) | ORAL | Status: DC
  Administered 2022-08-29 – 2022-08-30 (×5): 500 mg via ORAL
  Filled 2022-08-28 (×5): qty 1

## 2022-08-28 NOTE — Assessment & Plan Note (Signed)
INR goal of 2.5-3.5. -INR of 3.8 on presentation -dosing per pharmacy

## 2022-08-28 NOTE — ED Notes (Signed)
Secretary ordered enema kit. 

## 2022-08-28 NOTE — Assessment & Plan Note (Signed)
Could be due to dehydration as she is improving follow IV fluids in ED.

## 2022-08-28 NOTE — Progress Notes (Signed)
ANTICOAGULATION CONSULT NOTE - Initial Consult  Pharmacy Consult for Warfarin Indication:  mechanical MVR and history of stroke  Allergies  Allergen Reactions   Zoloft [Sertraline] Hives, Itching and Rash    Patient Measurements: Vital Signs: Temp: 97.8 F (36.6 C) (10/21 1521) Temp Source: Oral (10/21 1521) BP: 128/82 (10/21 1521) Pulse Rate: 72 (10/21 1521)  Labs: Recent Labs    08/27/22 2043 08/27/22 2111 08/27/22 2221 08/28/22 0438 08/28/22 1520  HGB 11.7*  --   --   --  11.0*  HCT 37.0  --   --   --  33.3*  PLT 194  --   --   --  176  LABPROT  --  36.9*  --   --  43.9*  INR  --  3.8*  --   --  4.7*  CREATININE 0.84  --   --  0.71  --   TROPONINIHS 81*  --  82*  --   --      CrCl cannot be calculated (Unknown ideal weight.).   Medical History: Past Medical History:  Diagnosis Date   Abnormality of gait 09/07/2016   Allergy    Diabetes mellitus without complication [facility])    Patient denies this - notes history of glucose intolerance   GERD (gastroesophageal reflux disease)    Hemiparesis and alteration of sensations as late effects of stroke ([place]) 09/07/2016   History of pneumonia 1997   Hypertension    S/P MVR (mitral valve replacement)    Mechanical mitral valve replacement at age 37 (done in [place])  // echo 7/17: EF 55-60, normal wall motion, bileaflet mechanical mitral valve prosthesis functioning normally, mild LAE, mildly reduced RVSF, small pericardial effusion   Stroke ([place]) 1997, 2013, 2015    Medications:  Medications Prior to Admission  Medication Sig Dispense Refill Last Dose   acetaminophen (TYLENOL) 325 MG tablet Take 1-2 tablets (325-650 mg total) by mouth every 4 (four) hours as needed for mild pain. (Patient taking differently: Take 650 mg by mouth every 6 (six) hours as needed for mild pain.)   UNK   Amino Acids-Protein Hydrolys (FEEDING SUPPLEMENT, PRO-STAT SUGAR FREE 64,) LIQD Take 30 mLs by mouth daily.   08/27/2022   amLODipine  (NORVASC) 5 MG tablet Take 1 tablet (5 mg total) by mouth at bedtime. (Patient taking differently: Take 5 mg by mouth in the morning.) 30 tablet 0 08/27/2022   atorvastatin (LIPITOR) 40 MG tablet Take 1 tablet (40 mg total) by mouth daily. 30 tablet 0 08/27/2022   Calcium Carbonate-Vit D-Min (CALCIUM 1200) 1200-1000 MG-UNIT CHEW Chew 1 tablet by mouth in the morning.   08/27/2022   Cholecalciferol (VITAMIN D-3) 25 MCG (1000 UT) CAPS Take 1,000 Units by mouth in the morning.   08/27/2022   denosumab (PROLIA) 60 MG/ML SOSY injection Inject 60 mg into the skin every 6 (six) months.   UNK   docusate sodium (COLACE) 100 MG capsule Take 100 mg by mouth in the morning.   08/27/2022   lidocaine (LIDODERM) 5 % Place 1 patch onto the skin at bedtime. To lower back   Past Week   Menthol, Topical Analgesic, (BIOFREEZE) 4 % GEL Apply 1 application  topically every 4 (four) hours as needed (mild pain). To back.   UNK   methocarbamol (ROBAXIN) 500 MG tablet Take 1 tablet (500 mg total) by mouth 3 (three) times daily. 90 tablet 0 08/27/2022   nitrofurantoin, macrocrystal-monohydrate, (MACROBID) 100 MG capsule Take 100 mg by mouth  2 (two) times daily. 5 day course. Pt on day 4.   08/27/2022   omeprazole (PRILOSEC OTC) 20 MG tablet Take 20 mg by mouth in the morning.   08/27/2022   ondansetron (ZOFRAN) 4 MG tablet Take 4 mg by mouth every 6 (six) hours as needed for nausea or vomiting.   UNK   PARoxetine (PAXIL) 10 MG tablet Take 10 mg by mouth in the morning.   08/27/2022   polyethylene glycol powder (GLYCOLAX/MIRALAX) 17 GM/SCOOP powder Take 17 g by mouth every evening. Hold for loose stools   Past Week   Promethazine HCl (PHENERGAN IJ) Inject 12.5 mg into the muscle every 6 (six) hours as needed (nausea, vomiting).   UNK   Tapentadol HCl 100 MG TABS Take 1 tablet (100 mg total) by mouth every 6 (six) hours. (Patient taking differently: Take 100 mg by mouth every 6 (six) hours as needed (severe pain).) 5 tablet 0 UNK    warfarin (COUMADIN) 10 MG tablet Take 10 mg by mouth every other day.   08/26/2022 at 1700   warfarin (COUMADIN) 5 MG tablet Take 5 mg by mouth every other day.   08/27/2022 at 1700    Scheduled:   (feeding supplement) PROSource Plus  30 mL Oral Daily   amLODipine  5 mg Oral q AM   atorvastatin  40 mg Oral Daily   calcium-vitamin D  1 tablet Oral q AM   cholecalciferol  1,000 Units Oral q AM   methocarbamol  500 mg Oral TID   nitrofurantoin (macrocrystal-monohydrate)  100 mg Oral BID   pantoprazole (PROTONIX) IV  40 mg Intravenous Daily   PARoxetine  10 mg Oral q AM   polyethylene glycol  17 g Oral BID   senna-docusate  1 tablet Oral BID   Warfarin - Pharmacist Dosing Inpatient   Does not apply q1600   Infusions:   sodium chloride 50 mL/hr at 08/28/22 1621   PRN: bisacodyl, ondansetron (ZOFRAN) IV  Assessment: 76 yof with a history of CVAs w/ residual left-sided hemiparesis, SAH, MR s/p mechanical mitral valve on Coumadin, HTN, GERD. Warfarin per pharmacy consult placed for  mechanical MVR and history of stroke .  Hx of SAH, CT head is negative  Patient taking warfarin prior to arrival. Home dose is Alternates '5mg'$  and '10mg'$  doses every other day Last taken 10/20 1700 ('5mg'$ ) and 10/19 1700 ('10mg'$ ).   Patient is finishing 5 day macrobid course.  INR on arrival 3.8 PT / INR today is 4.7 which is above therapeutic goal. Hgb 11.7; plt 194  Goal of Therapy:  INR Goal 2.5-3.5 Monitor platelets by anticoagulation protocol: Yes   Plan:  Hold Coumadin tonight. Monitor for s/s of hemorrhage, daily INR, CBC Watch for new DDIs  [place] Crane, Roylene Reason, [facility] Clinical Pharmacist  08/28/2022 4:43 PM   [facility] pharmacy phone numbers are listed on [email or web address]

## 2022-08-28 NOTE — Assessment & Plan Note (Signed)
Pt with improving mentation follow IV fluids. Able to move all extremities on exam. Lower suspicion for new CVA.

## 2022-08-28 NOTE — ED Notes (Signed)
RN and NT readjusted pt in bed. Pt is contracted and permanently leans on her right side, NT placed a pillow on right side.

## 2022-08-28 NOTE — H&P (Signed)
History and Physical    Patient: [name] WJX:[id number] DOB: 11-28-1945 DOA: 08/27/2022 DOS: the patient was seen and examined on 08/28/2022 PCP: Tisovec, [name], MD  Patient coming from: SNF  Chief Complaint:  Chief Complaint  Patient presents with   Altered Mental Status   HPI: [name] is a 76 y.o. female with medical history significant of multiple CVA with residual left-sided deficit, SAH,MR s/p mechanical mitral valve on Coumadin, hypertension who presents with nausea, vomiting and AMS.  Patient is a limited historian.  Only reports that she had vomited about 3 times since being in the ED.  Denies abdominal pain.  She is oriented to herself and place but otherwise unable to provide much other history.Spoke with husband who says she has been vomiting throughout the day. Does not feel she has confusion or had another stroke. States she was walking earlier today.   In the ED, she was afebrile and normotensive on room air. WBC of 6.7, Hemoglobin of 11.7.  Lactate of 2.  Sodium 139, K of 3.4, CBG of 181, creatinine of 0.84.  Troponin elevated but flat at 81 and 82.  INR of 3.8.  Negative flu/COVID PCR  CT head is negative for any acute infarct or hemorrhage.  CT A/P with findings suggestive of gastritis and large amount of retained stool in the distal colon/rectosigmoid region concerning for constipation. Review of Systems: unable to review all systems due to the inability of the patient to answer questions. Past Medical History:  Diagnosis Date   Abnormality of gait 09/07/2016   Allergy    Diabetes mellitus without complication [facility])    Patient denies this - notes history of glucose intolerance   GERD (gastroesophageal reflux disease)    Hemiparesis and alteration of sensations as late effects of stroke ([place]) 09/07/2016   History of pneumonia 1997   Hypertension    S/P MVR (mitral valve replacement)    Mechanical mitral valve replacement at age 29 (done  in [place])  // echo 7/17: EF 55-60, normal wall motion, bileaflet mechanical mitral valve prosthesis functioning normally, mild LAE, mildly reduced RVSF, small pericardial effusion   Stroke ([place]) 1997, 2013, 2015   Past Surgical History:  Procedure Laterality Date   [place]   Social History:  reports that she has never smoked. She has never been exposed to tobacco smoke. She has never used smokeless tobacco. She reports that she does not drink alcohol and does not use drugs.  Allergies  Allergen Reactions   Zoloft [Sertraline] Hives, Itching and Rash    Family History  Problem Relation Age of Onset   Cancer Mother        Bone   Heart disease Mother    Hyperlipidemia Mother    Hypertension Mother    Stroke Father    Hypertension Father    Heart attack Neg Hx     Prior to Admission medications   Medication Sig Start Date End Date Taking? Authorizing Provider  acetaminophen (TYLENOL) 325 MG tablet Take 1-2 tablets (325-650 mg total) by mouth every 4 (four) hours as needed for mild pain. 05/26/22   Angiulli, Lavon Paganini, PA-C  amLODipine (NORVASC) 5 MG tablet Take 1 tablet (5 mg total) by mouth at bedtime. 05/26/22   Angiulli, Lavon Paganini, PA-C  atorvastatin (LIPITOR) 40 MG tablet Take 1  tablet (40 mg total) by mouth daily. 05/26/22   Angiulli, Lavon Paganini, PA-C  CALCIUM PO Take 1,200 mg by mouth daily.    [provider]  Cholecalciferol (VITAMIN D3 PO) Take 1 tablet by mouth daily.    [provider]  denosumab (PROLIA) 60 MG/ML SOSY injection Inject 60 mg into the skin every 6 (six) months.    [provider]  docusate sodium (COLACE) 100 MG capsule Take 100 mg at bedtime by mouth.     [provider]  methocarbamol (ROBAXIN) 500 MG tablet Take 1 tablet (500 mg total) by mouth 3 (three) times daily. 05/26/22   Angiulli, Lavon Paganini, PA-C  omeprazole  (PRILOSEC) 20 MG capsule Take 1 capsule (20 mg total) by mouth daily. 05/26/22   Angiulli, Lavon Paganini, PA-C  Propylene Glycol-Glycerin (SOOTHE) 0.6-0.6 % SOLN Place 1 drop into both eyes 2 (two) times daily as needed (dry eyes).    [provider]  Tapentadol HCl 100 MG TABS Take 1 tablet (100 mg total) by mouth every 6 (six) hours. 05/31/22   Angiulli, Lavon Paganini, PA-C  warfarin (COUMADIN) 2 MG tablet Take 1 tablet (2 mg total) by mouth as directed. Take 2 mg by mouth on Sundays 06/01/22   Angiulli, Lavon Paganini, PA-C  warfarin (COUMADIN) 4 MG tablet Take 1 tablet (4 mg total) by mouth as directed. Take 4 mg by mouth on Mon, Tues, Wed, Thurs, Fri, Sat 06/01/22   Cathlyn Parsons, PA-C    Physical Exam: Vitals:   08/27/22 2230 08/27/22 2245 08/27/22 2300 08/27/22 2315  BP: 125/77 114/71 125/62 115/64  Pulse: 88 76 74 82  Resp: 17  18 (!) 23  Temp:      TempSrc:      SpO2: 97% 94% 98% 95%   Constitutional: NAD, calm, comfortable, ill appearing female laying flat in bed with vomited food emesis all over mouth and neck Eyes: lids and conjunctivae normal ENMT: Mucous membranes are dry Neck: normal, supple Respiratory: clear to auscultation bilaterally, no wheezing, no crackles. Normal respiratory effort. No accessory muscle use.  Cardiovascular: Regular rate and rhythm, no murmurs / rubs / gallops. No extremity edema.  Abdomen: soft, no tenderness, non distended.  Bowel sounds positive.  Musculoskeletal: no clubbing / cyanosis. No joint deformity upper and lower extremities.muscle wasting on all extremities  Skin: no rashes, lesions, ulcers. No induration Neurologic: CN 2-12 grossly intact. Able to lift all 4 extremities although unable to perform bilateral hand grip. Alert and oriented to self and place.  Psychiatric:  Normal mood. Data Reviewed:  See HPI  Assessment and Plan: * AMS (altered mental status) Could be due to dehydration as she is improving follow IV fluids in ED.  H/O  mitral valve replacement with mechanical valve INR goal of 2.5-3.5. -INR of 3.8 on presentation -dosing per pharmacy  HTN (hypertension) Resume home meds pending med rec  Gastritis N/V secondary to gastritis and likely constipation -clear liquid diet for bowel rest -IV PPI  Constipation CTA/P with findings of large amount of retained stool in the distal colon/rectosigmoid region concerning for constipation. -Give bisacodyl suppository once  Prediabetes - A1c 6.1% on 04/26/2022 - Continue diet control    History of CVA with residual deficit Pt with improving mentation follow IV fluids. Able to move all extremities on exam. Lower suspicion for new CVA.       Advance Care Planning:   Code Status: Prior DNR-confirmed with husband  Consults: none  Family Communication:  Discussed with husband [name] over the phone  Severity of Illness: The appropriate patient status for this patient is OBSERVATION. Observation status is judged to be reasonable and necessary in order to provide the required intensity of service to ensure the patient's safety. The patient's presenting symptoms, physical exam findings, and initial radiographic and laboratory data in the context of their medical condition is felt to place them at decreased risk for further clinical deterioration. Furthermore, it is anticipated that the patient will be medically stable for discharge from the hospital within 2 midnights of admission.   Author: Orene Desanctis, DO 08/28/2022 1:34 AM  For on call review [email or web address].

## 2022-08-28 NOTE — ED Notes (Signed)
Patient is resting, with HOB elevated. No n/v thus far. Will continue to monitor

## 2022-08-28 NOTE — Plan of Care (Signed)
?  Problem: Education: ?Goal: Knowledge of General Education information will improve ?Description: Including pain rating scale, medication(s)/side effects and non-pharmacologic comfort measures ?Outcome: Progressing ?  ?Problem: Health Behavior/Discharge Planning: ?Goal: Ability to manage health-related needs will improve ?Outcome: Progressing ?  ?Problem: Coping: ?Goal: Level of anxiety will decrease ?Outcome: Progressing ?  ?

## 2022-08-28 NOTE — Assessment & Plan Note (Signed)
N/V secondary to gastritis and likely constipation -clear liquid diet for bowel rest -IV PPI

## 2022-08-28 NOTE — Assessment & Plan Note (Signed)
CTA/P with findings of large amount of retained stool in the distal colon/rectosigmoid region concerning for constipation. -Give bisacodyl suppository once

## 2022-08-28 NOTE — Assessment & Plan Note (Signed)
-   A1c 6.1% on 04/26/2022 - Continue diet control

## 2022-08-28 NOTE — Progress Notes (Signed)
HS medications not given.  Pt coughs with sips of water.  Pending swallow evaluation. Ayesha Mohair BSN RN [place] 08/28/2022, 9:18 PM

## 2022-08-28 NOTE — Assessment & Plan Note (Signed)
Resume home meds pending med rec

## 2022-08-28 NOTE — Progress Notes (Signed)
ANTICOAGULATION CONSULT NOTE - Initial Consult  Pharmacy Consult for Warfarin  Indication:  mechanical MVR, history of stroke  Allergies  Allergen Reactions   Zoloft [Sertraline] Hives, Itching and Rash     Vital Signs: Temp: 97.6 F (36.4 C) (10/20 2157) Temp Source: Rectal (10/20 2157) BP: 115/64 (10/20 2315) Pulse Rate: 82 (10/20 2315)  Labs: Recent Labs    08/27/22 2043 08/27/22 2111 08/27/22 2221  HGB 11.7*  --   --   HCT 37.0  --   --   PLT 194  --   --   LABPROT  --  36.9*  --   INR  --  3.8*  --   CREATININE 0.84  --   --   TROPONINIHS 81*  --  82*    CrCl cannot be calculated (Unknown ideal weight.).   Medical History: Past Medical History:  Diagnosis Date   Abnormality of gait 09/07/2016   Allergy    Diabetes mellitus without complication [facility])    Patient denies this - notes history of glucose intolerance   GERD (gastroesophageal reflux disease)    Hemiparesis and alteration of sensations as late effects of stroke ([place]) 09/07/2016   History of pneumonia 1997   Hypertension    S/P MVR (mitral valve replacement)    Mechanical mitral valve replacement at age 75 (done in [place])  // echo 7/17: EF 55-60, normal wall motion, bileaflet mechanical mitral valve prosthesis functioning normally, mild LAE, mildly reduced RVSF, small pericardial effusion   Stroke ([place]) 1997, 2013, 2015     Assessment: 76 y/o F on warfarin PTA for mechanical MVR and history of stroke. Presents to the ED with altered mental status. Does have history of of SAH, CT head is negative. INR is above goal at 3.8.   Goal of Therapy:  INR 2.5-3.5 Monitor platelets by anticoagulation protocol: Yes   Plan:  INR with AM labs  Narda Bonds, PharmD, [facility] Pharmacist Phone: [phone]

## 2022-08-28 NOTE — Progress Notes (Signed)
PROGRESS NOTE   [name]  UKG:[id number]    DOB: 05/08/1946    DOA: 08/27/2022  PCP: Haywood Pao, MD   I have briefly reviewed patients previous medical records in [facility].  Chief Complaint  Patient presents with   Altered Mental Status    Brief Narrative:  76 year old married female, resident of [facility] (since June 01, 2022.  Spouse lives in [place]), PMH of multiple CVAs, residual left-sided hemiparesis, reportedly ambulated 400 feet and 300 feet respectively 2 days PTA, SAH, MR s/p mechanical mitral valve on Coumadin (follows with Dr. Acie Fredrickson, Cardiology) hypertension, GERD, presented to ED from SNF on 08/28/2022 with multiple episodes of nausea, nonbloody emesis and altered mental status.  Reportedly no sickly contacts.  Admitted for suspected acute gastroenteritis, constipation and confusion.  Improving.   Assessment & Plan:  Principal Problem:   AMS (altered mental status) Active Problems:   H/O mitral valve replacement with mechanical valve   HTN (hypertension)   History of CVA with residual deficit   Prediabetes   Constipation   Gastritis   Acute gastroenteritis, suspect viral: Nausea and nonbloody emesis at the SNF and subsequent 3 episodes in the ED.  Currently thirsty and wants something to drink.  No sickly contacts.  CT abdomen and pelvis: Stomach distended with ingested food, mild gastric wall thickening in the pyloric region which may suggest gastritis.  Constipation without bowel obstruction.  Discussed with Dr. Weber Cooks, radiology who advised that the imaging is not consistent with gastric outlet obstruction.  Trial of full liquid diet, IV fluids, antiemetics, IV PPIs.  Advance diet as tolerated.  Treat constipation as below.  Constipation: No response to Dulcolax suppository.  Will give her milk and molasses enema.  Aggressive bowel regimen including MiraLAX and Senokot-S.  Confusion: Transient and resolved.   May be related to nausea and vomiting.  Essential hypertension: Controlled on amlodipine, continue.  Hyperlipidemia: Continue atorvastatin  History of mechanical mitral valve replacement: INR goal 2.5-3.5.  Warfarin per pharmacy protocol.  Prediabetes A1c 6.1 on 04/26/2022.  Continue dietary control.  History of CVA with residual left hemiparesis: As per spouse's report, has been in SNF since 05/12/2022.  Has been working with PT and OT.  Ambulated 400 feet with PT on 9/19 and 300 feet with PT on 9/20.  CT head without acute findings.  Hypokalemia:  Replaced.  Chronic normocytic anemia: Stable.  Prolonged QTc: EKG on admission showed QTc of '5 1 3 '$ ms but had BBB morphology.  Potassium replaced.  Added magnesium to this a.m. labs but not resulted yet.  Follow-up repeat EKG.  Minimize QT prolonging medications.  There is no height or weight on file to calculate BMI.   DVT prophylaxis:   Anticoagulated on warfarin   Code Status: DNR:  Family Communication: Spouse at bedside Disposition:  Status is: Observation The patient will require care spanning > 2 midnights and should be moved to inpatient because: Will remain on IV fluids while diet is being gradually advanced as tolerated.     Consultants:   None  Procedures:     Antimicrobials:      Subjective:  Seen this morning in the ED.  Patient able to provide much of above history and appeared coherent.  Spouse at bedside corroborated.  3 episodes of nonbloody emesis at SNF and 3 episodes in the ED.  No abdominal pain.  Unable to say when she last had a BM.  Denies sickly contacts with similar  symptoms at SNF.  Objective:   Vitals:   08/28/22 0915 08/28/22 0930 08/28/22 0945 08/28/22 1000  BP: 126/73 129/71 121/77 116/73  Pulse: 78 74 76 76  Resp: '15 14 17 17  '$ Temp:      TempSrc:      SpO2: 98% 97% 99% 97%    General exam: Elderly female, moderately built, frail and chronically ill looking lying propped up in bed  with right lean (chronic per patient and spouse) Respiratory system: Clear to auscultation. Respiratory effort normal. Cardiovascular system: S1 & S2 heard, RRR. No JVD, murmurs, rubs, gallops or clicks. No pedal edema.  Telemetry personally reviewed: Sinus rhythm Gastrointestinal system: Abdomen is nondistended, soft and nontender. No organomegaly or masses felt. Normal bowel sounds heard. Central nervous system: Alert and oriented. No focal neurological deficits. Extremities: 5 x 5 power in the right extremities and possible 4 x 5 power in the left extremities. Skin: No rashes, lesions or ulcers Psychiatry: Judgement and insight appear normal. Mood & affect appropriate.     Data Reviewed:   I have personally reviewed following labs and imaging studies   CBC: Recent Labs  Lab 08/27/22 2043  WBC 6.7  NEUTROABS 5.4  HGB 11.7*  HCT 37.0  MCV 83.7  PLT 194     Basic Metabolic Panel: Recent Labs  Lab 08/27/22 2043 08/28/22 0438  NA 139 139  K 3.4* 4.1  CL 99 104  CO2 28 27  GLUCOSE 181* 168*  BUN 18 19  CREATININE 0.84 0.71  CALCIUM 9.8 9.6     Liver Function Tests: Recent Labs  Lab 08/27/22 2043  AST 18  ALT 15  ALKPHOS 95  BILITOT 0.7  PROT 6.3*  ALBUMIN 3.6     CBG: Recent Labs  Lab 08/27/22 2154  GLUCAP 160*     Microbiology Studies:   Recent Results (from the past 240 hour(s))  Blood Culture (Routine X 2)     Status: None (Preliminary result)   Collection Time: 08/27/22  8:43 PM   Specimen: BLOOD  Result Value Ref Range Status   Specimen Description BLOOD LEFT ANTECUBITAL  Final   Special Requests   Final    BOTTLES DRAWN AEROBIC AND ANAEROBIC Blood Culture results may not be optimal due to an inadequate volume of blood received in culture bottles   Culture   Final    NO GROWTH < 12 HOURS Performed at [facility] [address], [place] [facility] [place], [place] [address]    Report Status PENDING  Incomplete  Resp Panel by RT-PCR (Flu A&B,  Covid)     Status: None   Collection Time: 08/27/22  8:47 PM   Specimen: Nasal Swab  Result Value Ref Range Status   SARS Coronavirus 2 by RT PCR NEGATIVE NEGATIVE Final    Comment: (NOTE) SARS-CoV-2 target nucleic acids are NOT DETECTED.  The SARS-CoV-2 RNA is generally detectable in upper respiratory specimens during the acute phase of infection. The lowest concentration of SARS-CoV-2 viral copies this assay can detect is 138 copies/mL. A negative result does not preclude SARS-Cov-2 infection and should not be used as the sole basis for treatment or other patient management decisions. A negative result may occur with  improper specimen collection/handling, submission of specimen other than nasopharyngeal swab, presence of viral mutation(s) within the areas targeted by this assay, and inadequate number of viral copies(<138 copies/mL). A negative result must be combined with clinical observations, patient history, and epidemiological information. The expected result is  Negative.  Fact Sheet for Patients:  EntrepreneurPulse.com.au  Fact Sheet for Healthcare Providers:  IncredibleEmployment.be  This test is no t yet approved or cleared by the [place] FDA and  has been authorized for detection and/or diagnosis of SARS-CoV-2 by FDA under an Emergency Use Authorization (EUA). This EUA will remain  in effect (meaning this test can be used) for the duration of the COVID-19 declaration under Section 564(b)(1) of the Act, 21 U.S.C.section 360bbb-3(b)(1), unless the authorization is terminated  or revoked sooner.       Influenza A by PCR NEGATIVE NEGATIVE Final   Influenza B by PCR NEGATIVE NEGATIVE Final    Comment: (NOTE) The Xpert Xpress SARS-CoV-2/FLU/RSV plus assay is intended as an aid in the diagnosis of influenza from Nasopharyngeal swab specimens and should not be used as a sole basis for treatment. Nasal washings and aspirates are  unacceptable for Xpert Xpress SARS-CoV-2/FLU/RSV testing.  Fact Sheet for Patients: EntrepreneurPulse.com.au  Fact Sheet for Healthcare Providers: IncredibleEmployment.be  This test is not yet approved or cleared by the [place] FDA and has been authorized for detection and/or diagnosis of SARS-CoV-2 by FDA under an Emergency Use Authorization (EUA). This EUA will remain in effect (meaning this test can be used) for the duration of the COVID-19 declaration under Section 564(b)(1) of the Act, 21 U.S.C. section 360bbb-3(b)(1), unless the authorization is terminated or revoked.  Performed at [facility], [place] [address], [place], [place] [address]   Blood Culture (Routine X 2)     Status: None (Preliminary result)   Collection Time: 08/27/22  8:48 PM   Specimen: BLOOD LEFT HAND  Result Value Ref Range Status   Specimen Description BLOOD LEFT HAND  Final   Special Requests   Final    BOTTLES DRAWN AEROBIC AND ANAEROBIC Blood Culture results may not be optimal due to an inadequate volume of blood received in culture bottles   Culture   Final    NO GROWTH < 12 HOURS Performed at [facility], [place] [address] [place], [place] [address]    Report Status PENDING  Incomplete    Radiology Studies:  CT ABDOMEN PELVIS W CONTRAST  Result Date: 08/27/2022 CLINICAL DATA:  Nausea and vomiting. EXAM: CT ABDOMEN AND PELVIS WITH CONTRAST TECHNIQUE: Multidetector CT imaging of the abdomen and pelvis was performed using the standard protocol following bolus administration of intravenous contrast. RADIATION DOSE REDUCTION: This exam was performed according to the departmental dose-optimization program which includes automated exposure control, adjustment of the mA and/or kV according to patient size and/or use of iterative reconstruction technique. CONTRAST:  29m OMNIPAQUE IOHEXOL 350 MG/ML SOLN COMPARISON:  CT examination dated Mar 25, 2016  FINDINGS: Lower chest: Bibasilar dependent atelectasis.  Cardiomegaly. Hepatobiliary: Right hepatic lobe cysts, the larger measuring up to 4.9 x 4.9 cm. Pancreas: Moderate generalized pancreatic atrophy. No pancreatic ductal dilatation Spleen: Normal in size without focal abnormality. Adrenals/Urinary Tract: Adrenal glands are unremarkable. Generalized renal cortical atrophy. Small cysts in upper pole of the right kidney. No evidence of hydronephrosis or ureteral calculus. Bladder is unremarkable. Stomach/Bowel: The stomach is distended with ingested food. Mild gastric wall thickening in the pyloric region which may suggest gastritis. Bowel loops otherwise normal in caliber. Large amount of retained stool in the distal colon/rectosigmoid region suggesting constipation. Vascular/Lymphatic: Mild atherosclerotic calcification of infrarenal abdominal aorta and branch vessels. No lymphadenopathy. Reproductive: Status post hysterectomy. No adnexal masses. Other: No abdominal wall hernia or abnormality. No abdominopelvic ascites.  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 Marked levoscoliosis centered at L2-L3 vertebral body. Disc height loss and subchondral erosions at L3-L4 with lateral subluxation. Multilevel facet joint arthropathy prominent at L4-L5 and L5-S1. IMPRESSION: 1. Stomach is distended with ingested food. Mild gastric wall thickening in the pyloric region which may suggest gastritis. 2. Large amount of retained stool in the distal colon/rectosigmoid region suggesting constipation. No evidence of bowel obstruction. 3. 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 4. No evidence of nephrolithiasis or hydronephrosis. 5. Additional chronic findings as above. 6. Aortic atherosclerosis. Electronically Signed   By: Keane Police D.O.   On: 08/27/2022 23:47   CT HEAD WO CONTRAST  Result Date:  08/27/2022 CLINICAL DATA:  Mental status change EXAM: CT HEAD WITHOUT CONTRAST TECHNIQUE: Contiguous axial images were obtained from the base of the skull through the vertex without intravenous contrast. RADIATION DOSE REDUCTION: This exam was performed according to the departmental dose-optimization program which includes automated exposure control, adjustment of the mA and/or kV according to patient size and/or use of iterative reconstruction technique. COMPARISON:  CT head 04/27/2022 FINDINGS: Brain: No intracranial hemorrhage, mass effect, or evidence of acute infarct. No hydrocephalus. No extra-axial fluid collection. Generalized cerebral atrophy. Ill-defined hypoattenuation within the cerebral white matter is nonspecific but consistent with chronic small vessel ischemic disease. Encephalomalacia in the right parietal lobe 9 mm high density structure in the center of the area of encephalomalacia possibly representing calcification is not significantly changed from 04/27/2022. Lacunar infarct left thalamus and both cerebellar hemispheres unchanged lenticular focal thickening of the posterior falx. Vascular: No hyperdense vessel. Intracranial arterial calcification. Skull: No fracture or focal lesion.  Right parietal craniotomy Sinuses/Orbits: No acute finding. Paranasal sinuses and mastoid air cells are well aerated. Other: None. IMPRESSION: No acute intracranial abnormality. Multiple chronic findings as described in the body of the report. Electronically Signed   By: Placido Sou M.D.   On: 08/27/2022 21:59   DG Chest Port 1 View  Result Date: 08/27/2022 CLINICAL DATA:  Vomiting. EXAM: PORTABLE CHEST 1 VIEW COMPARISON:  April 26, 2022. FINDINGS: Stable cardiomegaly. Status post cardiac valve repair. Both lungs are clear. The visualized skeletal structures are unremarkable. IMPRESSION: No active disease. Electronically Signed   By: Marijo Conception M.D.   On: 08/27/2022 21:17    Scheduled Meds:     (feeding supplement) PROSource Plus  30 mL Oral Daily   amLODipine  5 mg Oral q AM   atorvastatin  40 mg Oral Daily   calcium-vitamin D  1 tablet Oral q AM   cholecalciferol  1,000 Units Oral q AM   methocarbamol  500 mg Oral TID   nitrofurantoin (macrocrystal-monohydrate)  100 mg Oral BID   pantoprazole (PROTONIX) IV  40 mg Intravenous Daily   PARoxetine  10 mg Oral q AM   polyethylene glycol  17 g Oral BID   senna-docusate  1 tablet Oral BID    Continuous Infusions:    sodium chloride       LOS: 0 days     Vernell Leep, MD,  FACP, [place], [place], [facility], [facility]   [facility] [place]     To contact the attending provider between 7A-7P or the covering provider during after hours 7P-7A, please log into the web site [email or web address] and access using universal [place] password for that web site. If you do not have the password,  please call the hospital operator.  08/28/2022, 12:09 PM

## 2022-08-28 NOTE — ED Notes (Signed)
Patient continues to Vomited, MD made aware. MD does not want Zofran given due to long QTC. HOB remains elevated. Will continue continue

## 2022-08-28 NOTE — ED Notes (Signed)
Secretary is ordering whole milk for enema. Adults/ pediatric ED is out of whole milk.

## 2022-08-28 NOTE — ED Notes (Signed)
Patient Straight cath no urine return, brief was soaked before cath. Will continue to monitor

## 2022-08-28 NOTE — Progress Notes (Deleted)
PROGRESS NOTE   [name]  VEL:[id number]    DOB: [birth year]    DOA: 08/27/2022  PCP: Haywood Pao, MD   I have briefly reviewed patients previous medical records in [facility].  Chief Complaint  Patient presents with   Altered Mental Status    Brief Narrative:  76 year old married female, resident of [facility] (since June 01, 2022.  Spouse lives in [place]), PMH of multiple CVAs, residual left-sided hemiparesis, reportedly ambulated 400 feet and 300 feet respectively 2 days PTA, SAH, MR s/p mechanical mitral valve on Coumadin (follows with Dr. Acie Fredrickson, Cardiology) hypertension, GERD, presented to ED from SNF on 08/28/2022 with multiple episodes of nausea, nonbloody emesis and altered mental status.  Reportedly no sickly contacts.  Admitted for suspected acute gastroenteritis, constipation and confusion.  Improving.   Assessment & Plan:  Principal Problem:   AMS (altered mental status) Active Problems:   H/O mitral valve replacement with mechanical valve   HTN (hypertension)   History of CVA with residual deficit   Prediabetes   Constipation   Gastritis   Acute gastroenteritis, suspect viral: Nausea and nonbloody emesis at the SNF and subsequent 3 episodes in the ED.  Currently thirsty and wants something to drink.  No sickly contacts.  CT abdomen and pelvis: Stomach distended with ingested food, mild gastric wall thickening in the pyloric region which may suggest gastritis.  Constipation without bowel obstruction.  Discussed with Dr. Weber Cooks, radiology who advised that the imaging is not consistent with gastric outlet obstruction.  Trial of full liquid diet, IV fluids, antiemetics, IV PPIs.  Advance diet as tolerated.  Treat constipation as below.  Constipation: No response to Dulcolax suppository.  Will give her milk and molasses enema.  Aggressive bowel regimen including MiraLAX and Senokot-S.  Confusion: Transient and resolved.   May be related to nausea and vomiting.  Essential hypertension: Controlled on amlodipine, continue.  Hyperlipidemia: Continue atorvastatin  History of mechanical mitral valve replacement: INR goal 2.5-3.5.  Warfarin per pharmacy protocol.  Prediabetes A1c 6.1 on 04/26/2022.  Continue dietary control.  History of CVA with residual left hemiparesis: As per spouse's report, has been in SNF since 05/12/2022.  Has been working with PT and OT.  Ambulated 400 feet with PT on 9/19 and 300 feet with PT on 9/20.  CT head without acute findings.  Hypokalemia:  Replaced.  Chronic normocytic anemia: Stable.  There is no height or weight on file to calculate BMI.   DVT prophylaxis:   Anticoagulated on warfarin   Code Status: DNR:  Family Communication: Spouse at bedside Disposition:  Status is: Observation The patient will require care spanning > 2 midnights and should be moved to inpatient because: Will remain on IV fluids while diet is being gradually advanced as tolerated.     Consultants:   None  Procedures:     Antimicrobials:      Subjective:  Seen this morning in the ED.  Patient able to provide much of above history and appeared coherent.  Spouse at bedside corroborated.  3 episodes of nonbloody emesis at SNF and 3 episodes in the ED.  No abdominal pain.  Unable to say when she last had a BM.  Denies sickly contacts with similar symptoms at SNF.  Objective:   Vitals:   08/28/22 0915 08/28/22 0930 08/28/22 0945 08/28/22 1000  BP: 126/73 129/71 121/77 116/73  Pulse: 78 74 76 76  Resp: '15 14 17 17  '$ Temp:  TempSrc:      SpO2: 98% 97% 99% 97%    General exam: Elderly female, moderately built, frail and chronically ill looking lying propped up in bed with right lean (chronic per patient and spouse) Respiratory system: Clear to auscultation. Respiratory effort normal. Cardiovascular system: S1 & S2 heard, RRR. No JVD, murmurs, rubs, gallops or clicks. No pedal edema.   Telemetry personally reviewed: Sinus rhythm Gastrointestinal system: Abdomen is nondistended, soft and nontender. No organomegaly or masses felt. Normal bowel sounds heard. Central nervous system: Alert and oriented. No focal neurological deficits. Extremities: 5 x 5 power in the right extremities and possible 4 x 5 power in the left extremities. Skin: No rashes, lesions or ulcers Psychiatry: Judgement and insight appear normal. Mood & affect appropriate.     Data Reviewed:   I have personally reviewed following labs and imaging studies   CBC: Recent Labs  Lab 08/27/22 2043  WBC 6.7  NEUTROABS 5.4  HGB 11.7*  HCT 37.0  MCV 83.7  PLT 469    Basic Metabolic Panel: Recent Labs  Lab 08/27/22 2043 08/28/22 0438  NA 139 139  K 3.4* 4.1  CL 99 104  CO2 28 27  GLUCOSE 181* 168*  BUN 18 19  CREATININE 0.84 0.71  CALCIUM 9.8 9.6    Liver Function Tests: Recent Labs  Lab 08/27/22 2043  AST 18  ALT 15  ALKPHOS 95  BILITOT 0.7  PROT 6.3*  ALBUMIN 3.6    CBG: Recent Labs  Lab 08/27/22 2154  GLUCAP 160*    Microbiology Studies:   Recent Results (from the past 240 hour(s))  Blood Culture (Routine X 2)     Status: None (Preliminary result)   Collection Time: 08/27/22  8:43 PM   Specimen: BLOOD  Result Value Ref Range Status   Specimen Description BLOOD LEFT ANTECUBITAL  Final   Special Requests   Final    BOTTLES DRAWN AEROBIC AND ANAEROBIC Blood Culture results may not be optimal due to an inadequate volume of blood received in culture bottles   Culture   Final    NO GROWTH < 12 HOURS Performed at [place] [address], [place], [place] [address]    Report Status PENDING  Incomplete  Resp Panel by RT-PCR (Flu A&B, Covid)     Status: None   Collection Time: 08/27/22  8:47 PM   Specimen: Nasal Swab  Result Value Ref Range Status   SARS Coronavirus 2 by RT PCR NEGATIVE NEGATIVE Final    Comment: (NOTE) SARS-CoV-2 target nucleic acids are NOT  DETECTED.  The SARS-CoV-2 RNA is generally detectable in upper respiratory specimens during the acute phase of infection. The lowest concentration of SARS-CoV-2 viral copies this assay can detect is 138 copies/mL. A negative result does not preclude SARS-Cov-2 infection and should not be used as the sole basis for treatment or other patient management decisions. A negative result may occur with  improper specimen collection/handling, submission of specimen other than nasopharyngeal swab, presence of viral mutation(s) within the areas targeted by this assay, and inadequate number of viral copies(<138 copies/mL). A negative result must be combined with clinical observations, patient history, and epidemiological information. The expected result is Negative.  Fact Sheet for Patients:  EntrepreneurPulse.com.au  Fact Sheet for Healthcare Providers:  IncredibleEmployment.be  This test is no t yet approved or cleared by the [place] FDA and  has been authorized for detection and/or diagnosis of SARS-CoV-2 by FDA under an Emergency  Use Authorization (EUA). This EUA will remain  in effect (meaning this test can be used) for the duration of the COVID-19 declaration under Section 564(b)(1) of the Act, 21 U.S.C.section 360bbb-3(b)(1), unless the authorization is terminated  or revoked sooner.       Influenza A by PCR NEGATIVE NEGATIVE Final   Influenza B by PCR NEGATIVE NEGATIVE Final    Comment: (NOTE) The Xpert Xpress SARS-CoV-2/FLU/RSV plus assay is intended as an aid in the diagnosis of influenza from Nasopharyngeal swab specimens and should not be used as a sole basis for treatment. Nasal washings and aspirates are unacceptable for Xpert Xpress SARS-CoV-2/FLU/RSV testing.  Fact Sheet for Patients: EntrepreneurPulse.com.au  Fact Sheet for Healthcare Providers: IncredibleEmployment.be  This test is not yet  approved or cleared by the [place] FDA and has been authorized for detection and/or diagnosis of SARS-CoV-2 by FDA under an Emergency Use Authorization (EUA). This EUA will remain in effect (meaning this test can be used) for the duration of the COVID-19 declaration under Section 564(b)(1) of the Act, 21 U.S.C. section 360bbb-3(b)(1), unless the authorization is terminated or revoked.  Performed at [facility], [facility] [address], [place], [place] [address]   Blood Culture (Routine X 2)     Status: None (Preliminary result)   Collection Time: 08/27/22  8:48 PM   Specimen: BLOOD LEFT HAND  Result Value Ref Range Status   Specimen Description BLOOD LEFT HAND  Final   Special Requests   Final    BOTTLES DRAWN AEROBIC AND ANAEROBIC Blood Culture results may not be optimal due to an inadequate volume of blood received in culture bottles   Culture   Final    NO GROWTH < 12 HOURS Performed at [facility], [place] [address] [place], [place] [address]    Report Status PENDING  Incomplete    Radiology Studies:  CT ABDOMEN PELVIS W CONTRAST  Result Date: 08/27/2022 CLINICAL DATA:  Nausea and vomiting. EXAM: CT ABDOMEN AND PELVIS WITH CONTRAST TECHNIQUE: Multidetector CT imaging of the abdomen and pelvis was performed using the standard protocol following bolus administration of intravenous contrast. RADIATION DOSE REDUCTION: This exam was performed according to the departmental dose-optimization program which includes automated exposure control, adjustment of the mA and/or kV according to patient size and/or use of iterative reconstruction technique. CONTRAST:  66m OMNIPAQUE IOHEXOL 350 MG/ML SOLN COMPARISON:  CT examination dated Mar 25, 2016 FINDINGS: Lower chest: Bibasilar dependent atelectasis.  Cardiomegaly. Hepatobiliary: Right hepatic lobe cysts, the larger measuring up to 4.9 x 4.9 cm. Pancreas: Moderate generalized pancreatic atrophy. No pancreatic ductal dilatation Spleen:  Normal in size without focal abnormality. Adrenals/Urinary Tract: Adrenal glands are unremarkable. Generalized renal cortical atrophy. Small cysts in upper pole of the right kidney. No evidence of hydronephrosis or ureteral calculus. Bladder is unremarkable. Stomach/Bowel: The stomach is distended with ingested food. Mild gastric wall thickening in the pyloric region which may suggest gastritis. Bowel loops otherwise normal in caliber. Large amount of retained stool in the distal colon/rectosigmoid region suggesting constipation. Vascular/Lymphatic: Mild atherosclerotic calcification of infrarenal abdominal aorta and branch vessels. No lymphadenopathy. Reproductive: Status post hysterectomy. No adnexal masses. Other: No abdominal wall hernia or abnormality. No abdominopelvic ascites. 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 Marked levoscoliosis centered at L2-L3 vertebral body. Disc height loss and subchondral erosions at L3-L4 with lateral subluxation. Multilevel facet joint arthropathy prominent at L4-L5 and L5-S1. IMPRESSION: 1. Stomach is distended with ingested food. Mild gastric  wall thickening in the pyloric region which may suggest gastritis. 2. Large amount of retained stool in the distal colon/rectosigmoid region suggesting constipation. No evidence of bowel obstruction. 3. 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 4. No evidence of nephrolithiasis or hydronephrosis. 5. Additional chronic findings as above. 6. Aortic atherosclerosis. Electronically Signed   By: Keane Police D.O.   On: 08/27/2022 23:47   CT HEAD WO CONTRAST  Result Date: 08/27/2022 CLINICAL DATA:  Mental status change EXAM: CT HEAD WITHOUT CONTRAST TECHNIQUE: Contiguous axial images were obtained from the base of the skull through the vertex without intravenous contrast. RADIATION DOSE REDUCTION: This exam was  performed according to the departmental dose-optimization program which includes automated exposure control, adjustment of the mA and/or kV according to patient size and/or use of iterative reconstruction technique. COMPARISON:  CT head 04/27/2022 FINDINGS: Brain: No intracranial hemorrhage, mass effect, or evidence of acute infarct. No hydrocephalus. No extra-axial fluid collection. Generalized cerebral atrophy. Ill-defined hypoattenuation within the cerebral white matter is nonspecific but consistent with chronic small vessel ischemic disease. Encephalomalacia in the right parietal lobe 9 mm high density structure in the center of the area of encephalomalacia possibly representing calcification is not significantly changed from 04/27/2022. Lacunar infarct left thalamus and both cerebellar hemispheres unchanged lenticular focal thickening of the posterior falx. Vascular: No hyperdense vessel. Intracranial arterial calcification. Skull: No fracture or focal lesion.  Right parietal craniotomy Sinuses/Orbits: No acute finding. Paranasal sinuses and mastoid air cells are well aerated. Other: None. IMPRESSION: No acute intracranial abnormality. Multiple chronic findings as described in the body of the report. Electronically Signed   By: Placido Sou M.D.   On: 08/27/2022 21:59   DG Chest Port 1 View  Result Date: 08/27/2022 CLINICAL DATA:  Vomiting. EXAM: PORTABLE CHEST 1 VIEW COMPARISON:  April 26, 2022. FINDINGS: Stable cardiomegaly. Status post cardiac valve repair. Both lungs are clear. The visualized skeletal structures are unremarkable. IMPRESSION: No active disease. Electronically Signed   By: Marijo Conception M.D.   On: 08/27/2022 21:17    Scheduled Meds:    (feeding supplement) PROSource Plus  30 mL Oral Daily   amLODipine  5 mg Oral q AM   atorvastatin  40 mg Oral Daily   calcium-vitamin D  1 tablet Oral q AM   cholecalciferol  1,000 Units Oral q AM   methocarbamol  500 mg Oral TID    nitrofurantoin (macrocrystal-monohydrate)  100 mg Oral BID   pantoprazole (PROTONIX) IV  40 mg Intravenous Daily   PARoxetine  10 mg Oral q AM   polyethylene glycol  17 g Oral BID   senna-docusate  1 tablet Oral BID    Continuous Infusions:     LOS: 0 days     Vernell Leep, MD,  FACP, [place], [place], [facility], [facility]   [facility] [place]     To contact the attending provider between 7A-7P or the covering provider during after hours 7P-7A, please log into the web site [email or web address] and access using universal [place] password for that web site. If you do not have the password, please call the hospital operator.  08/28/2022, 11:43 AM

## 2022-08-29 DIAGNOSIS — K529 Noninfective gastroenteritis and colitis, unspecified: Secondary | ICD-10-CM | POA: Diagnosis not present

## 2022-08-29 LAB — BASIC METABOLIC PANEL
Anion gap: 9 (ref 5–15)
BUN: 11 mg/dL (ref 8–23)
CO2: 25 mmol/L (ref 22–32)
Calcium: 9.2 mg/dL (ref 8.9–10.3)
Chloride: 106 mmol/L (ref 98–111)
Creatinine, Ser: 0.69 mg/dL (ref 0.44–1.00)
GFR, Estimated: >60 mL/min (ref >60)
Glucose, Bld: 115 mg/dL — ABNORMAL HIGH (ref 70–99)
Potassium: 3.5 mmol/L (ref 3.5–5.1)
Sodium: 140 mmol/L (ref 135–145)

## 2022-08-29 LAB — MAGNESIUM: Magnesium: 2.2 mg/dL (ref 1.7–2.4)

## 2022-08-29 LAB — PROTIME-INR
INR: 3.8 — ABNORMAL HIGH (ref 0.8–1.2)
Prothrombin Time: 37 seconds — ABNORMAL HIGH (ref 11.4–15.2)

## 2022-08-29 MED ORDER — WARFARIN SODIUM 1 MG PO TABS
1.0000 mg | ORAL_TABLET | Freq: Once | ORAL | Status: AC
  Administered 2022-08-29: 1 mg via ORAL
  Filled 2022-08-29: qty 1

## 2022-08-29 MED ORDER — POTASSIUM CHLORIDE CRYS ER 20 MEQ PO TBCR
40.0000 meq | EXTENDED_RELEASE_TABLET | Freq: Once | ORAL | Status: AC
  Administered 2022-08-29: 40 meq via ORAL
  Filled 2022-08-29: qty 2

## 2022-08-29 NOTE — Progress Notes (Addendum)
ANTICOAGULATION CONSULT NOTE - Initial Consult  Pharmacy Consult for Warfarin Indication:  mechanical MVR and history of stroke  Allergies  Allergen Reactions   Zoloft [Sertraline] Hives, Itching and Rash    Patient Measurements: Vital Signs: Temp: 97.8 F (36.6 C) (10/22 0315) Temp Source: Oral (10/22 0315) BP: 135/66 (10/22 0315) Pulse Rate: 71 (10/22 0315)  Labs: Recent Labs    08/27/22 2043 08/27/22 2111 08/27/22 2221 08/28/22 0438 08/28/22 1520 08/29/22 0251  HGB 11.7*  --   --   --  11.0*  --   HCT 37.0  --   --   --  33.3*  --   PLT 194  --   --   --  176  --   LABPROT  --  36.9*  --   --  43.9* 37.0*  INR  --  3.8*  --   --  4.7* 3.8*  CREATININE 0.84  --   --  0.71  --  0.69  TROPONINIHS 81*  --  82*  --   --   --      CrCl cannot be calculated (Unknown ideal weight.).   Medical History: Past Medical History:  Diagnosis Date   Abnormality of gait 09/07/2016   Allergy    Diabetes mellitus without complication [facility])    Patient denies this - notes history of glucose intolerance   GERD (gastroesophageal reflux disease)    Hemiparesis and alteration of sensations as late effects of stroke ([place]) 09/07/2016   History of pneumonia 1997   Hypertension    S/P MVR (mitral valve replacement)    Mechanical mitral valve replacement at age 76 (done in [place])  // echo 7/17: EF 55-60, normal wall motion, bileaflet mechanical mitral valve prosthesis functioning normally, mild LAE, mildly reduced RVSF, small pericardial effusion   Stroke ([place]) 1997, 2013, 2015    Medications:  Medications Prior to Admission  Medication Sig Dispense Refill Last Dose   acetaminophen (TYLENOL) 325 MG tablet Take 1-2 tablets (325-650 mg total) by mouth every 4 (four) hours as needed for mild pain. (Patient taking differently: Take 650 mg by mouth every 6 (six) hours as needed for mild pain.)   UNK   Amino Acids-Protein Hydrolys (FEEDING SUPPLEMENT, PRO-STAT SUGAR FREE 64,) LIQD Take 30  mLs by mouth daily.   08/27/2022   amLODipine (NORVASC) 5 MG tablet Take 1 tablet (5 mg total) by mouth at bedtime. (Patient taking differently: Take 5 mg by mouth in the morning.) 30 tablet 0 08/27/2022   atorvastatin (LIPITOR) 40 MG tablet Take 1 tablet (40 mg total) by mouth daily. 30 tablet 0 08/27/2022   Calcium Carbonate-Vit D-Min (CALCIUM 1200) 1200-1000 MG-UNIT CHEW Chew 1 tablet by mouth in the morning.   08/27/2022   Cholecalciferol (VITAMIN D-3) 25 MCG (1000 UT) CAPS Take 1,000 Units by mouth in the morning.   08/27/2022   denosumab (PROLIA) 60 MG/ML SOSY injection Inject 60 mg into the skin every 6 (six) months.   UNK   docusate sodium (COLACE) 100 MG capsule Take 100 mg by mouth in the morning.   08/27/2022   lidocaine (LIDODERM) 5 % Place 1 patch onto the skin at bedtime. To lower back   Past Week   Menthol, Topical Analgesic, (BIOFREEZE) 4 % GEL Apply 1 application  topically every 4 (four) hours as needed (mild pain). To back.   UNK   methocarbamol (ROBAXIN) 500 MG tablet Take 1 tablet (500 mg total) by mouth 3 (three) times daily.  90 tablet 0 08/27/2022   nitrofurantoin, macrocrystal-monohydrate, (MACROBID) 100 MG capsule Take 100 mg by mouth 2 (two) times daily. 5 day course. Pt on day 4.   08/27/2022   omeprazole (PRILOSEC OTC) 20 MG tablet Take 20 mg by mouth in the morning.   08/27/2022   ondansetron (ZOFRAN) 4 MG tablet Take 4 mg by mouth every 6 (six) hours as needed for nausea or vomiting.   UNK   PARoxetine (PAXIL) 10 MG tablet Take 10 mg by mouth in the morning.   08/27/2022   polyethylene glycol powder (GLYCOLAX/MIRALAX) 17 GM/SCOOP powder Take 17 g by mouth every evening. Hold for loose stools   Past Week   Promethazine HCl (PHENERGAN IJ) Inject 12.5 mg into the muscle every 6 (six) hours as needed (nausea, vomiting).   UNK   Tapentadol HCl 100 MG TABS Take 1 tablet (100 mg total) by mouth every 6 (six) hours. (Patient taking differently: Take 100 mg by mouth every 6 (six)  hours as needed (severe pain).) 5 tablet 0 UNK   warfarin (COUMADIN) 10 MG tablet Take 10 mg by mouth every other day.   08/26/2022 at 1700   warfarin (COUMADIN) 5 MG tablet Take 5 mg by mouth every other day.   08/27/2022 at 1700    Scheduled:   (feeding supplement) PROSource Plus  30 mL Oral Daily   amLODipine  5 mg Oral q AM   atorvastatin  40 mg Oral Daily   calcium-vitamin D  1 tablet Oral q AM   cholecalciferol  1,000 Units Oral q AM   methocarbamol  500 mg Oral TID   nitrofurantoin (macrocrystal-monohydrate)  100 mg Oral BID   pantoprazole (PROTONIX) IV  40 mg Intravenous Daily   PARoxetine  10 mg Oral q AM   polyethylene glycol  17 g Oral BID   senna-docusate  1 tablet Oral BID   Warfarin - Pharmacist Dosing Inpatient   Does not apply q1600   Infusions:   sodium chloride 50 mL/hr at 08/29/22 0400   PRN: bisacodyl, ondansetron (ZOFRAN) IV  Assessment: 76 yof with a history of CVAs w/ residual left-sided hemiparesis, SAH, MR s/p mechanical mitral valve on Coumadin, HTN, GERD. Warfarin per pharmacy consult placed for  mechanical MVR and history of stroke . Hx of SAH, CT head is negative.  Patient taking warfarin prior to arrival. Home dose is Alternates '5mg'$  and '10mg'$  doses every other day. Last taken 10/20 1700 ('5mg'$ ) and 10/19 1700 ('10mg'$ ).   Patient is finishing 5 day macrobid course.  Today, INR is supratherapeutic at 3.8 (down from 4.7 yesterday). CBC stable. Noted difficulty with swallowing liquids 10/21 PM - SLP eval and ok to continue full liquid diet + PO meds. Anticipate further INR decrease with dose held 10/21 but variable PO intake may alter INR trend.   Goal of Therapy:  INR Goal 2.5-3.5 Monitor platelets by anticoagulation protocol: Yes   Plan:  Small warfarin dose tonight - '1mg'$  PO x1 Monitor for s/s of hemorrhage, daily INR, CBC Watch for new DDIs, monitor PO intake  Dimple Nanas, PharmD, BCPS 08/29/2022 8:52 AM

## 2022-08-29 NOTE — Evaluation (Signed)
Physical Therapy Evaluation Patient Details Name: [name] MRN: [id number] DOB: Sep 29, 1946 Today's Date: 08/29/2022  History of Present Illness  [name] is a 76 y.o. female presents with nausea, vomiting and AMS. PHMx: multiple CVA with residual left-sided deficit, SAH,MR s/p mechanical mitral valve on Coumadin, hypertension.Has been at [facility] since 05/2022.  Clinical Impression  Pt admitted with above from SNF. Pt requiring moderate assist for bed mobility and unable to stand due to persisting nausea/dizziness. BP 129/95, SpO2 95%, HR 69. Pt noted to have left beating nystagmus with smooth pursuits. Pt would likely benefit from more thorough vestibular assessment with pre-medication for nausea (not quite due yet during evaluation). Will continue to follow acutely.      Recommendations for follow up therapy are one component of a multi-disciplinary discharge planning process, led by the attending physician.  Recommendations may be updated based on patient status, additional functional criteria and insurance authorization.  Follow Up Recommendations Skilled nursing-short term rehab (<3 hours/day) Can patient physically be transported by private vehicle: No    Assistance Recommended at Discharge Frequent or constant Supervision/Assistance  Patient can return home with the following  A lot of help with walking and/or transfers;A lot of help with bathing/dressing/bathroom    Equipment Recommendations None recommended by PT  Recommendations for Other Services       Functional Status Assessment Patient has had a recent decline in their functional status and demonstrates the ability to make significant improvements in function in a reasonable and predictable amount of time.     Precautions / Restrictions Precautions Precautions: Fall Restrictions Weight Bearing Restrictions: No      Mobility  Bed Mobility Overal bed mobility: Needs Assistance Bed Mobility: Supine  to Sit, Sit to Supine     Supine to sit: Mod assist Sit to supine: Mod assist   General bed mobility comments: Heavy moderate assist for supine <> sit, decreased initiation by pt    Transfers                   General transfer comment: unable due to nausea/dizziness    Ambulation/Gait                  Stairs            Wheelchair Mobility    Modified Rankin (Stroke Patients Only)       Balance Overall balance assessment: Needs assistance Sitting-balance support: Bilateral upper extremity supported, Feet supported Sitting balance-Leahy Scale: Poor Sitting balance - Comments: right lateral lean, requiring minA to correct                                     Pertinent Vitals/Pain Pain Assessment Pain Assessment: Faces Faces Pain Scale: No hurt    Home Living Family/patient expects to be discharged to:: Skilled nursing facility                   Additional Comments: [facility] since 05/2022    Prior Function Prior Level of Function : Needs assist       Physical Assist : Mobility (physical);ADLs (physical)     Mobility Comments: Has been working with PT at SNF ambulating 300-400 feet with RW ADLs Comments: takes sponge baths, reports staff at SNF have been helping her more with this than her doing it herself (but at home she was doing more of her ADLs by  herself)     Hand Dominance   Dominant Hand: Right    Extremity/Trunk Assessment   Upper Extremity Assessment Upper Extremity Assessment: Defer to OT evaluation    Lower Extremity Assessment Lower Extremity Assessment: Overall WFL for tasks assessed    Cervical / Trunk Assessment Cervical / Trunk Assessment: Kyphotic  Communication   Communication: Receptive difficulties;Expressive difficulties  Cognition Arousal/Alertness: Awake/alert Behavior During Therapy: WFL for tasks assessed/performed Overall Cognitive Status: History of cognitive impairments -  at baseline                                 General Comments: Decreased memory and problem solving        General Comments      Exercises     Assessment/Plan    PT Assessment Patient needs continued PT services  PT Problem List Decreased strength;Decreased activity tolerance;Decreased balance;Decreased mobility;Decreased cognition;Decreased safety awareness       PT Treatment Interventions DME instruction;Gait training;Functional mobility training;Therapeutic activities;Therapeutic exercise;Balance training;Patient/family education    PT Goals (Current goals can be found in the Care Plan section)  Acute Rehab PT Goals Patient Stated Goal: feel better PT Goal Formulation: With patient Time For Goal Achievement: 09/12/22 Potential to Achieve Goals: Fair    Frequency Min 3X/week     Co-evaluation               AM-PAC PT "6 Clicks" Mobility  Outcome Measure Help needed turning from your back to your side while in a flat bed without using bedrails?: A Little Help needed moving from lying on your back to sitting on the side of a flat bed without using bedrails?: A Lot Help needed moving to and from a bed to a chair (including a wheelchair)?: A Lot Help needed standing up from a chair using your arms (e.g., wheelchair or bedside chair)?: A Lot Help needed to walk in hospital room?: Total Help needed climbing 3-5 steps with a railing? : Total 6 Click Score: 11    End of Session   Activity Tolerance: Other (comment) (limited by dizziness/nausea) Patient left: in bed;with call bell/phone within reach;with bed alarm set Nurse Communication: Mobility status;Other (comment) (nausea meds) PT Visit Diagnosis: Other abnormalities of gait and mobility (R26.89);Difficulty in walking, not elsewhere classified (R26.2);Dizziness and giddiness (R42)    Time: 5035-4656 PT Time Calculation (min) (ACUTE ONLY): 26 min   Charges:   PT Evaluation $PT Eval Moderate  Complexity: 1 Mod PT Treatments $Therapeutic Activity: 8-22 mins        Wyona Almas, PT, DPT Acute Rehabilitation Services Office [phone]   Deno Etienne 08/29/2022, 4:13 PM

## 2022-08-29 NOTE — Plan of Care (Signed)

## 2022-08-29 NOTE — Evaluation (Signed)
Clinical/Bedside Swallow Evaluation Patient Details  Name: [name] MRN: [id number] Date of Birth: 1946-04-18  Today's Date: 08/29/2022 Time: SLP Start Time (ACUTE ONLY): 1026 SLP Stop Time (ACUTE ONLY): 0321 SLP Time Calculation (min) (ACUTE ONLY): 14 min  Past Medical History:  Past Medical History:  Diagnosis Date   Abnormality of gait 09/07/2016   Allergy    Diabetes mellitus without complication ([place])    Patient denies this - notes history of glucose intolerance   GERD (gastroesophageal reflux disease)    Hemiparesis and alteration of sensations as late effects of stroke ([place]) 09/07/2016   History of pneumonia 1997   Hypertension    S/P MVR (mitral valve replacement)    Mechanical mitral valve replacement at age 84 (done in [place])  // echo 7/17: EF 55-60, normal wall motion, bileaflet mechanical mitral valve prosthesis functioning normally, mild LAE, mildly reduced RVSF, small pericardial effusion   Stroke ([place]) 1997, 2013, 2015   Past Surgical History:  Past Surgical History:  Procedure Laterality Date   ABDOMINAL HYSTERECTOMY     BRAIN SURGERY     [facility]   HPI:  76 y.o. female presents with nausea, vomiting and AMS. 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 Oral motor exam was unremarkable. Per RN, pt with some coughing during yale swallow, with staff last night prompting SLP consult. Pt also with recent emesis upon admission, presently on full liquid diet. SLP assessed pt with 3 oz water challenge, puree, and small amounts of regular textures. Pt with brisk swallow per palpation. No overt s/sx of aspiration with any PO. Recommend continue full liquid diet due to hx of recent emesis and  advance diet as tolerated per MD discretion. SLP to follow up for diet check given episodic difficutly with nurse last  night.  SLP Visit Diagnosis: Dysphagia, unspecified (R13.10)    Aspiration Risk  Mild aspiration risk    Diet Recommendation   Full liquid diet; advance as tolerated per MD discretion with pt hx of emesis  Medication Administration: Whole meds with liquid    Other  Recommendations Oral Care Recommendations: Oral care BID    Recommendations for follow up therapy are one component of a multi-disciplinary discharge planning process, led by the attending physician.  Recommendations may be updated based on patient status, additional functional criteria and insurance authorization.  Follow up Recommendations        Assistance Recommended at Discharge    Functional Status Assessment Patient has had a recent decline in their functional status and demonstrates the ability to make significant improvements in function in a reasonable and predictable amount of time.  Frequency and Duration min 1 x/week  1 week       Prognosis Prognosis for Safe Diet Advancement: Good Barriers to Reach Goals: Time post onset      Swallow Study   General Date of Onset: 08/28/22 HPI: 76 y.o. female presents with nausea, vomiting and AMS. PHMx: multiple CVA with residual left-sided deficit, SAH,MR s/p mechanical mitral valve on Coumadin, hypertension.Has been at [facility] since 05/2022 Type of Study: Bedside Swallow Evaluation Previous Swallow Assessment: 2023 clinical swallow eval; unremarkable Diet Prior to this Study: Thin liquids (full liquids) Temperature Spikes Noted: No Respiratory  Status: Room air History of Recent Intubation: No Behavior/Cognition: Alert;Cooperative Oral Cavity Assessment: Within Functional Limits Oral Care Completed by SLP: No Oral Cavity - Dentition: Adequate natural dentition Vision: Functional for self-feeding Self-Feeding Abilities: Needs set  up Patient Positioning: Upright in chair Baseline Vocal Quality: Normal Volitional Swallow: Able to elicit    Oral/Motor/Sensory Function Overall Oral Motor/Sensory Function: Within functional limits   Ice Chips Ice chips: Within functional limits Presentation: Spoon   Thin Liquid Thin Liquid: Within functional limits Presentation: Cup;Straw    Nectar Thick Nectar Thick Liquid: Not tested   Honey Thick Honey Thick Liquid: Not tested   Puree Puree: Within functional limits   Solid     Solid: Within functional limits Presentation: Romeo, CCC-SLP Acute Rehabilitation Services   08/29/2022,10:51 AM

## 2022-08-29 NOTE — Evaluation (Signed)
Occupational Therapy Evaluation Patient Details Name: [name] MRN: [id number] DOB: 09/12/1946 Today's Date: 08/29/2022   History of Present Illness [name] is a 76 y.o. female presents with nausea, vomiting and AMS. PHMx: multiple CVA with residual left-sided deficit, SAH,MR s/p mechanical mitral valve on Coumadin, hypertension.Has been at [facility] since 05/2022.   Clinical Impression   This 76 yo female admitted with above presents to acute OT with PLOF of being able to ambulate 300-400 feet with RW and PT and getting a lot of A for basic ADLs (per pt because it was easier for them to do it for me)--at home prior to going to SNF in June of this year she was able to do more of her ADLs by herself and was ambulating with a quad cane at times. She currently is min A-total A for basic ADLs and Mod A for all mobility without AD. She will continue to benefit from acute OT with follow up back at SNF.      Recommendations for follow up therapy are one component of a multi-disciplinary discharge planning process, led by the attending physician.  Recommendations may be updated based on patient status, additional functional criteria and insurance authorization.   Follow Up Recommendations  Skilled nursing-short term rehab (<3 hours/day)    Assistance Recommended at Discharge Frequent or constant Supervision/Assistance  Patient can return home with the following A lot of help with walking and/or transfers;A lot of help with bathing/dressing/bathroom;Assistance with cooking/housework;Assistance with feeding;Help with stairs or ramp for entrance;Assist for transportation;Direct supervision/assist for medications management;Direct supervision/assist for financial management    Functional Status Assessment  Patient has had a recent decline in their functional status and demonstrates the ability to make significant improvements in function in a reasonable and predictable amount of time.   Equipment Recommendations  Other (comment) (TBD next venue)       Precautions / Restrictions Precautions Precautions: Fall Restrictions Weight Bearing Restrictions: No      Mobility Bed Mobility Overal bed mobility: Needs Assistance Bed Mobility: Rolling, Sidelying to Sit Rolling: Min assist (to get all the way over on her left and sequencing cues to roll) Sidelying to sit: Mod assist (HOB flat, min A for legs, mod A for trunk)            Transfers Overall transfer level: Needs assistance   Transfers: Sit to/from Stand, Bed to chair/wheelchair/BSC Sit to Stand: Mod assist Stand pivot transfers: Mod assist         General transfer comment: going to her right with therapist standing in front of her and pt holding onto therapists arms to turn      Balance Overall balance assessment: Needs assistance Sitting-balance support: Bilateral upper extremity supported, Feet supported   Sitting balance - Comments: varied between fair and poor due to dizziness and nausea   Standing balance support: Bilateral upper extremity supported Standing balance-Leahy Scale: Poor                             ADL either performed or assessed with clinical judgement   ADL Overall ADL's : Needs assistance/impaired Eating/Feeding: NPO   Grooming: Minimal assistance;Sitting Grooming Details (indicate cue type and reason): in recliner Upper Body Bathing: Minimal assistance;Sitting Upper Body Bathing Details (indicate cue type and reason): recliner Lower Body Bathing: Maximal assistance Lower Body Bathing Details (indicate cue type and reason): Mod A sit<>stand and maintain standing Upper Body Dressing :  Maximal assistance;Sitting Upper Body Dressing Details (indicate cue type and reason): recliner Lower Body Dressing: Total assistance Lower Body Dressing Details (indicate cue type and reason): Mod A sit<>stand and maintain standing Toilet Transfer: Moderate  assistance;Stand-pivot Toilet Transfer Details (indicate cue type and reason): simulated bed>recliner going to her right with therapist standing in front of her and pt holding onto therapists arms to turn [place] and Hygiene: Total assistance Toileting - Clothing Manipulation Details (indicate cue type and reason): Mod A sit<>stand and maintain standing             Vision Baseline Vision/History: 1 Wears glasses Ability to See in Adequate Light: 0 Adequate Patient Visual Report: No change from baseline              Pertinent Vitals/Pain Pain Assessment Pain Assessment: Faces Faces Pain Scale: Hurts little more Pain Location: lower back with coming up to sit from sidelying at EOB Pain Descriptors / Indicators: Aching, Sore Pain Intervention(s): Limited activity within patient's tolerance, Monitored during session, Repositioned     Hand Dominance Right   Extremity/Trunk Assessment Upper Extremity Assessment Upper Extremity Assessment: LUE deficits/detail LUE Deficits / Details: Has some but minimal functional movement of LUR LUE Coordination: decreased fine motor;decreased gross motor              Cognition Arousal/Alertness: Awake/alert Behavior During Therapy: WFL for tasks assessed/performed Overall Cognitive Status: History of cognitive impairments - at baseline                                 General Comments: Decreased memory and problem solving                Home Living Family/patient expects to be discharged to:: Skilled nursing facility                                 Additional Comments: [facility] since 05/2022      Prior Functioning/Environment Prior Level of Function : Needs assist       Physical Assist : Mobility (physical);ADLs (physical)     Mobility Comments: Has been working with PT at SNF ambulating 300-400 feet with RW ADLs Comments: takes sponge baths, reports staff at SNF  have been helping her more with this than her doing it herself (but at home she was doing more of her ADLs by herself)        OT Problem List: Decreased strength;Decreased range of motion;Decreased activity tolerance;Impaired balance (sitting and/or standing);Impaired UE functional use;Decreased safety awareness;Decreased cognition;Decreased coordination;Pain      OT Treatment/Interventions: Self-care/ADL training;DME and/or AE instruction;Patient/family education;Balance training;Neuromuscular education;Therapeutic exercise;Therapeutic activities    OT Goals(Current goals can be found in the care plan section) Acute Rehab OT Goals Patient Stated Goal: to not be nauseated OT Goal Formulation: With patient Time For Goal Achievement: 09/12/22 Potential to Achieve Goals: Good  OT Frequency: Min 2X/week       AM-PAC OT "6 Clicks" Daily Activity     Outcome Measure Help from another person eating meals?: Total Help from another person taking care of personal grooming?: A Little Help from another person toileting, which includes using toliet, bedpan, or urinal?: A Lot Help from another person bathing (including washing, rinsing, drying)?: A Lot Help from another person to put on and taking off regular upper body clothing?: A Lot Help from another  person to put on and taking off regular lower body clothing?: Total 6 Click Score: 11   End of Session Equipment Utilized During Treatment: Gait belt Nurse Communication: Mobility status;Need for lift equipment (stedy)  Activity Tolerance:  (limited by dizziness and nausea) Patient left: in chair;with call bell/phone within reach;with chair alarm set;with family/visitor present  OT Visit Diagnosis: Unsteadiness on feet (R26.81);Other abnormalities of gait and mobility (R26.89);Muscle weakness (generalized) (M62.81);Pain;Other symptoms and signs involving cognitive function Pain - part of body:  (lower back)                Time: 6761-9509 OT  Time Calculation (min): 38 min Charges:  OT General Charges $OT Visit: 1 Visit OT Evaluation $OT Eval Moderate Complexity: 1 Mod OT Treatments $Self Care/Home Management : 23-37 mins  [address], OTR/L Acute Rehab Services [facility] [phone] Office [phone]    Almon Register 08/29/2022, 9:33 AM

## 2022-08-29 NOTE — Progress Notes (Signed)
PROGRESS NOTE   [name]  ELF:[id number]    DOB: [birth year]    DOA: 08/27/2022  PCP: Haywood Pao, MD   I have briefly reviewed patients previous medical records in [facility].  Chief Complaint  Patient presents with   Altered Mental Status    Brief Narrative:  76 year old married female, resident of [facility] SNF (since June 01, 2022.  Spouse lives in [place]), PMH of multiple CVAs, residual left-sided hemiparesis, reportedly ambulated 400 feet and 300 feet respectively 2 days PTA, SAH, MR s/p mechanical mitral valve on Coumadin (follows with Dr. Acie Fredrickson, Cardiology) hypertension, GERD, presented to ED from SNF on 08/28/2022 with multiple episodes of nausea, nonbloody emesis and altered mental status.  Reportedly no sickly contacts.  Admitted for suspected acute gastroenteritis, constipation and confusion.  Improving.  Hopeful discharge back to SNF on 10/23   Assessment & Plan:  Principal Problem:   AMS (altered mental status) Active Problems:   H/O mitral valve replacement with mechanical valve   HTN (hypertension)   History of CVA with residual deficit   Prediabetes   Constipation   Gastritis   Acute gastroenteritis, suspect viral: Nausea and nonbloody emesis at the SNF and subsequent 3 episodes in the ED.  Currently thirsty and wants something to drink.  No sickly contacts.  CT abdomen and pelvis: Stomach distended with ingested food, mild gastric wall thickening in the pyloric region which may suggest gastritis.  Constipation without bowel obstruction.  Discussed with Dr. Weber Cooks, radiology who advised that the imaging is not consistent with gastric outlet obstruction.  Trial of full liquid diet, IV fluids, antiemetics, IV PPIs.  Advance diet as tolerated.  Treat constipation as below.  Unfortunately did not start full liquid diet yesterday due to concern for dysphagia and SLP could not see her until today.  Now on full liquid diet.  No  further emesis since hospital admission.  Advance diet as tolerated.  Vertigo: CT head without acute findings.  PT consulted for vestibular rehab.  Constipation: S/p Dulcolax suppository followed by milk and molasses enema yesterday with good effect.  Aggressive bowel regimen including MiraLAX and Senokot-S.  Confusion: Transient and resolved.  May be related to nausea and vomiting.  Essential hypertension: Controlled on amlodipine, continue.  Hyperlipidemia: Continue atorvastatin  History of mechanical mitral valve replacement: INR goal 2.5-3.5.  Warfarin per pharmacy protocol.  INR today 3.8.  Prediabetes A1c 6.1 on 04/26/2022.  Continue dietary control.  History of CVA with residual left hemiparesis: As per spouse's report, has been in SNF since 05/12/2022.  Has been working with PT and OT.  Ambulated 400 feet with PT on 9/19 and 300 feet with PT on 9/20.  CT head without acute findings.  OT recommends Ortho ending to SNF.  Hypokalemia:  Replaced  Chronic normocytic anemia: Stable.  Prolonged QTc: EKG on admission showed QTc of 513 ms but had BBB morphology.  Potassium replaced.  Added magnesium to this a.m. labs but not resulted yet.  Follow-up repeat EKG, 444 ms on 10/21.  Minimize QT prolonging medications.  There is no height or weight on file to calculate BMI.   DVT prophylaxis:   Anticoagulated on warfarin   Code Status: DNR:  Family Communication: Spouse at bedside Disposition:  Status is: Inpatient.  Hopeful DC back to SNF 10/23     Consultants:   None  Procedures:     Antimicrobials:      Subjective:  Appears to have had a  big BM following enema yesterday, as per spouse.  No abdominal pain.  No further nausea or vomiting since hospital admission.  As per OT, reported vertigo for 20 to 30 minutes when she got out of bed to chair.  Objective:   Vitals:   08/28/22 2035 08/29/22 0007 08/29/22 0315 08/29/22 1427  BP: 129/68 (!) 143/78 135/66 (!)  129/95  Pulse: 75 74 71 64  Resp: '15 20 18 16  '$ Temp: 98.3 F (36.8 C)  97.8 F (36.6 C) 97.7 F (36.5 C)  TempSrc: Oral  Oral Oral  SpO2: 96% 95% 96% 94%    General exam: Elderly female, moderately built, frail and chronically ill looking sitting up comfortably in reclining chair this morning Respiratory system: Clear to auscultation.  No increased work of breathing. Cardiovascular system: S1 & S2 heard, RRR. No JVD, murmurs, rubs, gallops or clicks. No pedal edema.  Telemetry personally reviewed: Low voltage, sinus rhythm, first-degree AV block. Gastrointestinal system: Abdomen is nondistended, soft and nontender. No organomegaly or masses felt. Normal bowel sounds heard. Central nervous system: Alert and oriented. No focal neurological deficits. Extremities: 5 x 5 power in the right extremities and possible 4 x 5 power in the left extremities. Skin: No rashes, lesions or ulcers Psychiatry: Judgement and insight appear normal. Mood & affect appropriate.     Data Reviewed:   I have personally reviewed following labs and imaging studies   CBC: Recent Labs  Lab 08/27/22 2043 08/28/22 1520  WBC 6.7 6.4  NEUTROABS 5.4  --   HGB 11.7* 11.0*  HCT 37.0 33.3*  MCV 83.7 82.8  PLT 194 371    Basic Metabolic Panel: Recent Labs  Lab 08/27/22 2043 08/28/22 0438 08/28/22 1520 08/29/22 0251  NA 139 139  --  140  K 3.4* 4.1  --  3.5  CL 99 104  --  106  CO2 28 27  --  25  GLUCOSE 181* 168*  --  115*  BUN 18 19  --  11  CREATININE 0.84 0.71  --  0.69  CALCIUM 9.8 9.6  --  9.2  MG  --   --  1.7 2.2    Liver Function Tests: Recent Labs  Lab 08/27/22 2043  AST 18  ALT 15  ALKPHOS 95  BILITOT 0.7  PROT 6.3*  ALBUMIN 3.6    CBG: Recent Labs  Lab 08/27/22 2154  GLUCAP 160*    Microbiology Studies:   Recent Results (from the past 240 hour(s))  Blood Culture (Routine X 2)     Status: None (Preliminary result)   Collection Time: 08/27/22  8:43 PM   Specimen:  BLOOD  Result Value Ref Range Status   Specimen Description BLOOD LEFT ANTECUBITAL  Final   Special Requests   Final    BOTTLES DRAWN AEROBIC AND ANAEROBIC Blood Culture results may not be optimal due to an inadequate volume of blood received in culture bottles   Culture   Final    NO GROWTH 2 DAYS Performed at [facility] [address] [place], [place] [address]    Report Status PENDING  Incomplete  Resp Panel by RT-PCR (Flu A&B, Covid)     Status: None   Collection Time: 08/27/22  8:47 PM   Specimen: Nasal Swab  Result Value Ref Range Status   SARS Coronavirus 2 by RT PCR NEGATIVE NEGATIVE Final    Comment: (NOTE) SARS-CoV-2 target nucleic acids are NOT DETECTED.  The SARS-CoV-2 RNA is generally  detectable in upper respiratory specimens during the acute phase of infection. The lowest concentration of SARS-CoV-2 viral copies this assay can detect is 138 copies/mL. A negative result does not preclude SARS-Cov-2 infection and should not be used as the sole basis for treatment or other patient management decisions. A negative result may occur with  improper specimen collection/handling, submission of specimen other than nasopharyngeal swab, presence of viral mutation(s) within the areas targeted by this assay, and inadequate number of viral copies(<138 copies/mL). A negative result must be combined with clinical observations, patient history, and epidemiological information. The expected result is Negative.  Fact Sheet for Patients:  EntrepreneurPulse.com.au  Fact Sheet for Healthcare Providers:  IncredibleEmployment.be  This test is no t yet approved or cleared by the [place] FDA and  has been authorized for detection and/or diagnosis of SARS-CoV-2 by FDA under an Emergency Use Authorization (EUA). This EUA will remain  in effect (meaning this test can be used) for the duration of the COVID-19 declaration under Section  564(b)(1) of the Act, 21 U.S.C.section 360bbb-3(b)(1), unless the authorization is terminated  or revoked sooner.       Influenza A by PCR NEGATIVE NEGATIVE Final   Influenza B by PCR NEGATIVE NEGATIVE Final    Comment: (NOTE) The Xpert Xpress SARS-CoV-2/FLU/RSV plus assay is intended as an aid in the diagnosis of influenza from Nasopharyngeal swab specimens and should not be used as a sole basis for treatment. Nasal washings and aspirates are unacceptable for Xpert Xpress SARS-CoV-2/FLU/RSV testing.  Fact Sheet for Patients: EntrepreneurPulse.com.au  Fact Sheet for Healthcare Providers: IncredibleEmployment.be  This test is not yet approved or cleared by the [place] FDA and has been authorized for detection and/or diagnosis of SARS-CoV-2 by FDA under an Emergency Use Authorization (EUA). This EUA will remain in effect (meaning this test can be used) for the duration of the COVID-19 declaration under Section 564(b)(1) of the Act, 21 U.S.C. section 360bbb-3(b)(1), unless the authorization is terminated or revoked.  Performed at [facility], [place] [address], [place], [place] [address]   Blood Culture (Routine X 2)     Status: None (Preliminary result)   Collection Time: 08/27/22  8:48 PM   Specimen: BLOOD LEFT HAND  Result Value Ref Range Status   Specimen Description BLOOD LEFT HAND  Final   Special Requests   Final    BOTTLES DRAWN AEROBIC AND ANAEROBIC Blood Culture results may not be optimal due to an inadequate volume of blood received in culture bottles   Culture   Final    NO GROWTH 2 DAYS Performed at [facility], [place] [address], [place], [place] [address]    Report Status PENDING  Incomplete    Radiology Studies:  CT ABDOMEN PELVIS W CONTRAST  Result Date: 08/27/2022 CLINICAL DATA:  Nausea and vomiting. EXAM: CT ABDOMEN AND PELVIS WITH CONTRAST TECHNIQUE: Multidetector CT imaging of the abdomen and pelvis  was performed using the standard protocol following bolus administration of intravenous contrast. RADIATION DOSE REDUCTION: This exam was performed according to the departmental dose-optimization program which includes automated exposure control, adjustment of the mA and/or kV according to patient size and/or use of iterative reconstruction technique. CONTRAST:  78m OMNIPAQUE IOHEXOL 350 MG/ML SOLN COMPARISON:  CT examination dated Mar 25, 2016 FINDINGS: Lower chest: Bibasilar dependent atelectasis.  Cardiomegaly. Hepatobiliary: Right hepatic lobe cysts, the larger measuring up to 4.9 x 4.9 cm. Pancreas: Moderate generalized pancreatic atrophy. No pancreatic ductal dilatation Spleen: Normal in  size without focal abnormality. Adrenals/Urinary Tract: Adrenal glands are unremarkable. Generalized renal cortical atrophy. Small cysts in upper pole of the right kidney. No evidence of hydronephrosis or ureteral calculus. Bladder is unremarkable. Stomach/Bowel: The stomach is distended with ingested food. Mild gastric wall thickening in the pyloric region which may suggest gastritis. Bowel loops otherwise normal in caliber. Large amount of retained stool in the distal colon/rectosigmoid region suggesting constipation. Vascular/Lymphatic: Mild atherosclerotic calcification of infrarenal abdominal aorta and branch vessels. No lymphadenopathy. Reproductive: Status post hysterectomy. No adnexal masses. Other: No abdominal wall hernia or abnormality. No abdominopelvic ascites. 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 Marked levoscoliosis centered at L2-L3 vertebral body. Disc height loss and subchondral erosions at L3-L4 with lateral subluxation. Multilevel facet joint arthropathy prominent at L4-L5 and L5-S1. IMPRESSION: 1. Stomach is distended with ingested food. Mild gastric wall thickening in the pyloric region which may suggest gastritis. 2. Large amount of retained stool in the  distal colon/rectosigmoid region suggesting constipation. No evidence of bowel obstruction. 3. 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 4. No evidence of nephrolithiasis or hydronephrosis. 5. Additional chronic findings as above. 6. Aortic atherosclerosis. Electronically Signed   By: Keane Police D.O.   On: 08/27/2022 23:47   CT HEAD WO CONTRAST  Result Date: 08/27/2022 CLINICAL DATA:  Mental status change EXAM: CT HEAD WITHOUT CONTRAST TECHNIQUE: Contiguous axial images were obtained from the base of the skull through the vertex without intravenous contrast. RADIATION DOSE REDUCTION: This exam was performed according to the departmental dose-optimization program which includes automated exposure control, adjustment of the mA and/or kV according to patient size and/or use of iterative reconstruction technique. COMPARISON:  CT head 04/27/2022 FINDINGS: Brain: No intracranial hemorrhage, mass effect, or evidence of acute infarct. No hydrocephalus. No extra-axial fluid collection. Generalized cerebral atrophy. Ill-defined hypoattenuation within the cerebral white matter is nonspecific but consistent with chronic small vessel ischemic disease. Encephalomalacia in the right parietal lobe 9 mm high density structure in the center of the area of encephalomalacia possibly representing calcification is not significantly changed from 04/27/2022. Lacunar infarct left thalamus and both cerebellar hemispheres unchanged lenticular focal thickening of the posterior falx. Vascular: No hyperdense vessel. Intracranial arterial calcification. Skull: No fracture or focal lesion.  Right parietal craniotomy Sinuses/Orbits: No acute finding. Paranasal sinuses and mastoid air cells are well aerated. Other: None. IMPRESSION: No acute intracranial abnormality. Multiple chronic findings as described in the body of the report. Electronically Signed   By:  Placido Sou M.D.   On: 08/27/2022 21:59   DG Chest Port 1 View  Result Date: 08/27/2022 CLINICAL DATA:  Vomiting. EXAM: PORTABLE CHEST 1 VIEW COMPARISON:  April 26, 2022. FINDINGS: Stable cardiomegaly. Status post cardiac valve repair. Both lungs are clear. The visualized skeletal structures are unremarkable. IMPRESSION: No active disease. Electronically Signed   By: Marijo Conception M.D.   On: 08/27/2022 21:17    Scheduled Meds:    (feeding supplement) PROSource Plus  30 mL Oral Daily   amLODipine  5 mg Oral q AM   atorvastatin  40 mg Oral Daily   calcium-vitamin D  1 tablet Oral q AM   cholecalciferol  1,000 Units Oral q AM   methocarbamol  500 mg Oral TID   nitrofurantoin (macrocrystal-monohydrate)  100 mg Oral BID   pantoprazole (PROTONIX) IV  40 mg Intravenous Daily   PARoxetine  10 mg Oral q AM  polyethylene glycol  17 g Oral BID   senna-docusate  1 tablet Oral BID   Warfarin - Pharmacist Dosing Inpatient   Does not apply q1600    Continuous Infusions:      LOS: 1 day     Vernell Leep, MD,  FACP, [place], [place], [facility], [facility]   [facility] [place]     To contact the attending provider between 7A-7P or the covering provider during after hours 7P-7A, please log into the web site [email or web address] and access using universal [place] password for that web site. If you do not have the password, please call the hospital operator.  08/29/2022, 4:07 PM

## 2022-08-30 DIAGNOSIS — R410 Disorientation, unspecified: Secondary | ICD-10-CM | POA: Diagnosis not present

## 2022-08-30 DIAGNOSIS — K529 Noninfective gastroenteritis and colitis, unspecified: Secondary | ICD-10-CM | POA: Diagnosis not present

## 2022-08-30 DIAGNOSIS — K5900 Constipation, unspecified: Secondary | ICD-10-CM | POA: Diagnosis not present

## 2022-08-30 LAB — PROTIME-INR
INR: 3.2 — ABNORMAL HIGH (ref 0.8–1.2)
Prothrombin Time: 32.7 seconds — ABNORMAL HIGH (ref 11.4–15.2)

## 2022-08-30 MED ORDER — SENNOSIDES-DOCUSATE SODIUM 8.6-50 MG PO TABS: 1.0000 | ORAL_TABLET | Freq: Two times a day (BID) | ORAL | Status: AC

## 2022-08-30 MED ORDER — BISACODYL 10 MG RE SUPP: 10.0000 mg | Freq: Every day | RECTAL | Status: DC | PRN

## 2022-08-30 MED ORDER — POLYETHYLENE GLYCOL 3350 17 G PO PACK: 17.0000 g | PACK | Freq: Two times a day (BID) | ORAL | Status: AC

## 2022-08-30 MED ORDER — AMLODIPINE BESYLATE 5 MG PO TABS: 5.0000 mg | ORAL_TABLET | Freq: Every morning | ORAL | Status: AC

## 2022-08-30 MED ORDER — WARFARIN SODIUM 5 MG PO TABS
5.0000 mg | ORAL_TABLET | Freq: Once | ORAL | Status: AC
  Administered 2022-08-30: 5 mg via ORAL
  Filled 2022-08-30: qty 1

## 2022-08-30 NOTE — NC FL2 (Signed)
[place] LEVEL OF CARE SCREENING TOOL     IDENTIFICATION  Patient Name: [name] Birthdate: [birth year] Sex: female Admission Date (Current Location): 08/27/2022  [facility] and [place] Number:  Herbalist and Address:  [facility] [place], [place] [address], [place], [place] [address]      Provider Number: [id number]  Attending Physician Name and Address:  Modena Jansky, MD  Relative Name and Phone Number:  [place], [place] [phone]    Current Level of Care: Hospital Recommended Level of Care: [place] Prior Approval Number:    Date Approved/Denied:   PASRR Number: [id number]  Discharge Plan: SNF    Current Diagnoses: Patient Active Problem List   Diagnosis Date Noted   AMS (altered mental status) 08/28/2022   Constipation 08/28/2022   Gastritis 08/28/2022   Subarachnoid hemorrhage ([place]) 04/30/2022   SAH (subarachnoid hemorrhage) ([place]) 04/26/2022   Goals of care, counseling/discussion    Palliative care by specialist    Acute CVA (cerebrovascular accident) ([place]) 07/01/2020   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   Anticoagulated on warfarin    Left hemiparesis ([place])    History of CVA with residual deficit    Thrombocytopenia ([place])    Prediabetes    Vertigo 09/24/2017   Benign paroxysmal positional vertigo    History of stroke    HLD (hyperlipidemia) 08/23/2017   Encounter for therapeutic drug monitoring 08/08/2017   Long term (current) use of anticoagulants [Z79.01] 07/12/2017   Hemiparesis and alteration of sensations as late effects of stroke ([place]) 09/07/2016   Abnormality of gait 09/07/2016   HTN (hypertension) 04/13/2016   H/O mitral valve replacement with mechanical valve 10/16/2015   Diabetes mellitus without complication (HCC)    Hemiparesis (L sided - mild) due to old stroke ([place])     Orientation RESPIRATION BLADDER Height & Weight     Self, Time, Situation, Place  Normal  Incontinent, External catheter Weight: 108 lb 0.4 oz (49 kg) Height:     BEHAVIORAL SYMPTOMS/MOOD NEUROLOGICAL BOWEL NUTRITION STATUS      Continent Diet (see discharge summary)  AMBULATORY STATUS COMMUNICATION OF NEEDS Skin   Total Care Verbally Other (Comment) (redness)                       Personal Care Assistance Level of Assistance  Bathing, Feeding, Dressing Bathing Assistance: Maximum assistance Feeding assistance: Limited assistance Dressing Assistance: Maximum assistance     Functional Limitations Info  Sight, Hearing, Speech Sight Info: Adequate Hearing Info: Adequate Speech Info: Adequate    SPECIAL CARE FACTORS FREQUENCY  PT (By licensed PT), OT (By licensed OT)     PT Frequency: 5x week OT Frequency: 5x week            Contractures Contractures Info: Not present    Additional Factors Info  Code Status, Allergies Code Status Info: DNR Allergies Info: zoloft           Current Medications (08/30/2022):  This is the current hospital active medication list Current Facility-Administered Medications  Medication Dose Route Frequency Provider Last Rate Last Admin   (feeding supplement) PROSource Plus liquid 30 mL  30 mL Oral Daily Hongalgi, Anand D, MD   30 mL at 08/30/22 1019   amLODipine (NORVASC) tablet 5 mg  5 mg Oral q AM Hongalgi, Anand D, MD   5 mg at 08/30/22 1019   atorvastatin (LIPITOR) tablet 40 mg  40 mg Oral Daily Hongalgi,  Lenis Dickinson, MD   40 mg at 08/30/22 1019   bisacodyl (DULCOLAX) suppository 10 mg  10 mg Rectal Daily PRN Hongalgi, Lenis Dickinson, MD       calcium-vitamin D (OSCAL WITH D) 500-5 MG-MCG per tablet 1 tablet  1 tablet Oral q AM Modena Jansky, MD   1 tablet at 08/30/22 1019   cholecalciferol (VITAMIN D3) 25 MCG (1000 UNIT) tablet 1,000 Units  1,000 Units Oral q AM Modena Jansky, MD   1,000 Units at 08/30/22 1020   methocarbamol (ROBAXIN) tablet 500 mg  500 mg Oral TID Vernell Leep D, MD   500 mg at 08/30/22 1021    ondansetron (ZOFRAN) injection 4 mg  4 mg Intravenous Q6H PRN Tu, Ching T, DO   4 mg at 08/30/22 1018   pantoprazole (PROTONIX) injection 40 mg  40 mg Intravenous Daily Tu, Ching T, DO   40 mg at 08/30/22 1018   PARoxetine (PAXIL) tablet 10 mg  10 mg Oral q AM Hongalgi, Anand D, MD   10 mg at 08/30/22 1340   polyethylene glycol (MIRALAX / GLYCOLAX) packet 17 g  17 g Oral BID Vernell Leep D, MD   17 g at 08/30/22 1019   senna-docusate (Senokot-S) tablet 1 tablet  1 tablet Oral BID Modena Jansky, MD   1 tablet at 08/30/22 1018   warfarin (COUMADIN) tablet 5 mg  5 mg Oral ONCE-1600 Rozann Lesches, [facility]       Warfarin - Pharmacist Dosing Inpatient   Does not apply q1600 Modena Jansky, MD   Given at 08/29/22 1930     Discharge Medications: Please see discharge summary for a list of discharge medications.  Relevant Imaging Results:  Relevant Lab Results:   Additional Information SS:[id number]  Joanne Chars, LCSW

## 2022-08-30 NOTE — TOC Initial Note (Signed)
Transition of Care [facility]) - Initial/Assessment Note    Patient Details  Name: [name] MRN: [id number] Date of Birth: [birth year]  [facility]) CM/SW Contact:    Joanne Chars, LCSW Phone Number: 08/30/2022, 3:44 PM  Clinical Narrative:  CSW spoke with pt about DC plan.  She confirmed she is resident at [facility], most recently has been in rehab unit.  Discussed PT recommendation for more rehab, she does want to return to [facility].  Husband also lives there.  Permission given to speak with husband, son, and daughter.   Referral sent to [facility], confirmed with [facility] that pt will return.                   Expected Discharge Plan: [place] Barriers to Discharge: Continued Medical Work up   Patient Goals and CMS Choice Patient states their goals for this hospitalization and ongoing recovery are:: being independant   Choice offered to / list presented to : Patient (pt current resident at [facility], wants to return)  Expected Discharge Plan and Services Expected Discharge Plan: [place] In-house Referral: Clinical Social Work   Post Acute Care Choice: [place] Living arrangements for the past 2 months: [place]                                      Prior Living Arrangements/Services Living arrangements for the past 2 months: [place] Lives with:: Facility Resident Patient language and need for interpreter reviewed:: Yes        Need for Family Participation in Patient Care: Yes (Comment) Care giver support system in place?: Yes (comment) Current home services: Other (comment) (na) Criminal Activity/Legal Involvement Pertinent to Current Situation/Hospitalization: No - Comment as needed  Activities of Daily Living      Permission Sought/Granted Permission sought to share information with : Family Supports Permission granted to share information  with : Yes, Verbal Permission Granted  Share Information with NAME: husband, daughter and son  Permission granted to share info w AGENCY: [facility]        Emotional Assessment Appearance:: Appears stated age Attitude/Demeanor/Rapport: Engaged Affect (typically observed): Appropriate, Pleasant Orientation: : Oriented to Self, Oriented to Place, Oriented to  Time, Oriented to Situation Alcohol / Substance Use: Not Applicable Psych Involvement: No (comment)  Admission diagnosis:  Transient alteration of awareness [R40.4] Lethargy [R53.83] Prolonged Q-T interval on ECG [R94.31] AMS (altered mental status) [R41.82] Nausea and vomiting, unspecified vomiting type [R11.2] Patient Active Problem List   Diagnosis Date Noted   AMS (altered mental status) 08/28/2022   Constipation 08/28/2022   Gastritis 08/28/2022   Subarachnoid hemorrhage ([place]) 04/30/2022   SAH (subarachnoid hemorrhage) ([place]) 04/26/2022   Goals of care, counseling/discussion    Palliative care by specialist    Acute CVA (cerebrovascular accident) ([place]) 07/01/2020   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   Anticoagulated on warfarin    Left hemiparesis ([place])    History of CVA with residual deficit    Thrombocytopenia ([place])    Prediabetes    Vertigo 09/24/2017   Benign paroxysmal positional vertigo    History of stroke    HLD (hyperlipidemia) 08/23/2017   Encounter for therapeutic drug monitoring 08/08/2017   Long term (current) use of anticoagulants [Z79.01] 07/12/2017   Hemiparesis and alteration of sensations as late effects of stroke ([place]) 09/07/2016   Abnormality of gait 09/07/2016  HTN (hypertension) 04/13/2016   H/O mitral valve replacement with mechanical valve 10/16/2015   Diabetes mellitus without complication (HCC)    Hemiparesis (L sided - mild) due to old stroke [facility])    PCP:  Tisovec, [name], MD Pharmacy:   [id number] - [place], [place] - [address] AT [place] [address] [place] [address] Phone: [phone] Fax: [phone]  [facility] [address] [place] [address] Phone: [phone] Fax: [phone]     Social Determinants of Health (SDOH) Interventions    Readmission Risk Interventions     No data to display

## 2022-08-30 NOTE — Discharge Summary (Signed)
Physician Discharge Summary  [name] UTM:[id number] DOB: 1946/07/23  PCP: Haywood Pao, MD  Admitted from: SNF Discharged to: SNF  Admit date: 08/27/2022 Discharge date: 08/30/2022  Recommendations for Outpatient Follow-up:    Contact information for follow-up providers     MD at SNF. Schedule an appointment as soon as possible for a visit.   Why: To be seen in 3 to 4 days with repeat labs (CBC & BMP).        Tisovec, [name], MD. Schedule an appointment as soon as possible for a visit.   Specialty: Internal Medicine Contact information: [place] [address] [phone]              Contact information for after-discharge care     Destination     [facility] .   Service: Skilled Nursing Contact information: [address] [place]: None    Equipment/Devices: TBD at [facility]    Discharge Condition: Improved and stable.   Code Status: DNR Diet recommendation:  Discharge Diet Orders (From admission, onward)     Start     Ordered   08/30/22 0000  Diet - low sodium heart healthy        08/30/22 1558             Discharge Diagnoses:  Principal Problem:   AMS (altered mental status) Active Problems:   H/O mitral valve replacement with mechanical valve   HTN (hypertension)   History of CVA with residual deficit   Prediabetes   Constipation   Gastritis   Brief Summary: 76 year old married female, resident of [facility] (since June 01, 2022.  Spouse lives in [place]), PMH of multiple CVAs, residual left-sided hemiparesis, reportedly ambulated 400 feet and 300 feet respectively 2 days PTA, SAH, MR s/p mechanical mitral valve on Coumadin (follows with Dr. Acie Fredrickson, Cardiology) hypertension, GERD, presented to ED from SNF on 08/28/2022 with multiple episodes of nausea, nonbloody emesis and altered  mental status.  Reportedly no sickly contacts.  Admitted for suspected acute gastroenteritis, constipation and confusion.  Improved.     Assessment & Plan:    Acute gastroenteritis, suspect viral: Nausea and nonbloody emesis at the SNF and subsequent 3 episodes in the ED.  Currently thirsty and wants something to drink.  No sickly contacts.  CT abdomen and pelvis: Stomach distended with ingested food, mild gastric wall thickening in the pyloric region which may suggest gastritis.  Constipation without bowel obstruction.  Discussed with Dr. Weber Cooks, radiology who advised that the imaging is not consistent with gastric outlet obstruction.  She was treated with IV fluids, as needed antiemetics, IV PPIs.  Diet was gradually advanced and she has tolerated soft diet today.  No further nausea or vomiting.  Constipation was treated as below.  There was some delay in initiation of full liquid diet due to concern for dysphagia until SLP had seen her.   Vertigo: CT head without acute findings.  Although PT was consulted for vestibular rehab, patient no longer complaining of nausea or dizziness today even when sitting up in bed.  If this recurs, can reconsult PT at SNF.   Constipation: S/p Dulcolax suppository followed by milk and molasses enema on 10/21 with good effect.  Aggressive bowel regimen including MiraLAX, Senokot S and as needed Dulcolax suppository.  Confusion: Transient and resolved.  May be related to nausea and vomiting.   Essential hypertension: Controlled on amlodipine, continue.   Hyperlipidemia: Continue atorvastatin   History of mechanical mitral valve replacement: INR goal 2.5-3.5.  Warfarin per pharmacy protocol.  INR today 3.2.  As per discussion with pharmacy, may resume prior home dose of warfarin, will get 5 mg prior to DC today.   Prediabetes A1c 6.1 on 04/26/2022.  Continue dietary control.   History of CVA with residual left hemiparesis: As per spouse's report, has  been in SNF since 05/12/2022.  Has been working with PT and OT.  Ambulated 400 feet with PT on 9/19 and 300 feet with PT on 9/20.  CT head without acute findings.  OT and PT recommended returning to SNF.   Hypokalemia:  Replaced   Chronic normocytic anemia: Stable.   Prolonged QTc: EKG on admission showed QTc of 513 ms but had BBB morphology.  Potassium replaced.  Follow-up repeat EKG, 444 ms on 10/21.  Minimize QT prolonging medications.      Consultants:   None   Procedures:       Discharge Instructions  Discharge Instructions     Call MD for:   Complete by: As directed    Recurrent confusion   Call MD for:  difficulty breathing, headache or visual disturbances   Complete by: As directed    Call MD for:  extreme fatigue   Complete by: As directed    Call MD for:  persistant dizziness or light-headedness   Complete by: As directed    Call MD for:  persistant nausea and vomiting   Complete by: As directed    Call MD for:  severe uncontrolled pain   Complete by: As directed    Call MD for:  temperature >100.4   Complete by: As directed    Diet - low sodium heart healthy   Complete by: As directed    Increase activity slowly   Complete by: As directed         Medication List     STOP taking these medications    docusate sodium 100 MG capsule Commonly known as: COLACE   lidocaine 5 % Commonly known as: LIDODERM   nitrofurantoin (macrocrystal-monohydrate) 100 MG capsule Commonly known as: MACROBID   ondansetron 4 MG tablet Commonly known as: ZOFRAN   PHENERGAN IJ   polyethylene glycol powder 17 GM/SCOOP powder Commonly known as: GLYCOLAX/MIRALAX Replaced by: polyethylene glycol 17 g packet   Tapentadol HCl 100 MG Tabs       TAKE these medications    acetaminophen 325 MG tablet Commonly known as: TYLENOL Take 1-2 tablets (325-650 mg total) by mouth every 4 (four) hours as needed for mild pain. What changed:  how much to take when to take  this   amLODipine 5 MG tablet Commonly known as: NORVASC Take 1 tablet (5 mg total) by mouth in the morning.   atorvastatin 40 MG tablet Commonly known as: LIPITOR Take 1 tablet (40 mg total) by mouth daily.   Biofreeze 4 % Gel Generic drug: Menthol (Topical Analgesic) Apply 1 application  topically every 4 (four) hours as needed (mild pain). To back.   bisacodyl 10 MG suppository Commonly known as: DULCOLAX Place 1 suppository (10 mg total) rectally daily as needed for moderate constipation or mild constipation.   Calcium 1200 1200-1000 MG-UNIT Chew Chew 1 tablet by mouth in the morning.   feeding supplement (PRO-STAT SUGAR FREE 64) Liqd Take 30  mLs by mouth daily.   methocarbamol 500 MG tablet Commonly known as: ROBAXIN Take 1 tablet (500 mg total) by mouth 3 (three) times daily.   omeprazole 20 MG tablet Commonly known as: PRILOSEC OTC Take 20 mg by mouth in the morning.   PARoxetine 10 MG tablet Commonly known as: PAXIL Take 10 mg by mouth in the morning.   polyethylene glycol 17 g packet Commonly known as: MIRALAX / GLYCOLAX Take 17 g by mouth 2 (two) times daily. Replaces: polyethylene glycol powder 17 GM/SCOOP powder   Prolia 60 MG/ML Sosy injection Generic drug: denosumab Inject 60 mg into the skin every 6 (six) months.   senna-docusate 8.6-50 MG tablet Commonly known as: Senokot-S Take 1 tablet by mouth 2 (two) times daily.   Vitamin D-3 25 MCG (1000 UT) Caps Take 1,000 Units by mouth in the morning.   warfarin 10 MG tablet Commonly known as: COUMADIN Take as directed. If you are unsure how to take this medication, talk to your nurse or doctor. Original instructions: Take 10 mg by mouth every other day.   warfarin 5 MG tablet Commonly known as: COUMADIN Take as directed. If you are unsure how to take this medication, talk to your nurse or doctor. Original instructions: Take 5 mg by mouth every other day.       Allergies  Allergen Reactions    Zoloft [Sertraline] Hives, Itching and Rash      Procedures/Studies: CT ABDOMEN PELVIS W CONTRAST  Result Date: 08/27/2022 CLINICAL DATA:  Nausea and vomiting. EXAM: CT ABDOMEN AND PELVIS WITH CONTRAST TECHNIQUE: Multidetector CT imaging of the abdomen and pelvis was performed using the standard protocol following bolus administration of intravenous contrast. RADIATION DOSE REDUCTION: This exam was performed according to the departmental dose-optimization program which includes automated exposure control, adjustment of the mA and/or kV according to patient size and/or use of iterative reconstruction technique. CONTRAST:  27m OMNIPAQUE IOHEXOL 350 MG/ML SOLN COMPARISON:  CT examination dated Mar 25, 2016 FINDINGS: Lower chest: Bibasilar dependent atelectasis.  Cardiomegaly. Hepatobiliary: Right hepatic lobe cysts, the larger measuring up to 4.9 x 4.9 cm. Pancreas: Moderate generalized pancreatic atrophy. No pancreatic ductal dilatation Spleen: Normal in size without focal abnormality. Adrenals/Urinary Tract: Adrenal glands are unremarkable. Generalized renal cortical atrophy. Small cysts in upper pole of the right kidney. No evidence of hydronephrosis or ureteral calculus. Bladder is unremarkable. Stomach/Bowel: The stomach is distended with ingested food. Mild gastric wall thickening in the pyloric region which may suggest gastritis. Bowel loops otherwise normal in caliber. Large amount of retained stool in the distal colon/rectosigmoid region suggesting constipation. Vascular/Lymphatic: Mild atherosclerotic calcification of infrarenal abdominal aorta and branch vessels. No lymphadenopathy. Reproductive: Status post hysterectomy. No adnexal masses. Other: No abdominal wall hernia or abnormality. No abdominopelvic ascites. 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 Marked levoscoliosis centered at L2-L3 vertebral body. Disc height loss and subchondral erosions at L3-L4  with lateral subluxation. Multilevel facet joint arthropathy prominent at L4-L5 and L5-S1. IMPRESSION: 1. Stomach is distended with ingested food. Mild gastric wall thickening in the pyloric region which may suggest gastritis. 2. Large amount of retained stool in the distal colon/rectosigmoid region suggesting constipation. No evidence of bowel obstruction. 3. 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 4. No evidence of nephrolithiasis or hydronephrosis. 5. Additional chronic findings as above. 6. Aortic atherosclerosis. Electronically Signed   By: IKeane PoliceD.O.   On: 08/27/2022  23:47   CT HEAD WO CONTRAST  Result Date: 08/27/2022 CLINICAL DATA:  Mental status change EXAM: CT HEAD WITHOUT CONTRAST TECHNIQUE: Contiguous axial images were obtained from the base of the skull through the vertex without intravenous contrast. RADIATION DOSE REDUCTION: This exam was performed according to the departmental dose-optimization program which includes automated exposure control, adjustment of the mA and/or kV according to patient size and/or use of iterative reconstruction technique. COMPARISON:  CT head 04/27/2022 FINDINGS: Brain: No intracranial hemorrhage, mass effect, or evidence of acute infarct. No hydrocephalus. No extra-axial fluid collection. Generalized cerebral atrophy. Ill-defined hypoattenuation within the cerebral white matter is nonspecific but consistent with chronic small vessel ischemic disease. Encephalomalacia in the right parietal lobe 9 mm high density structure in the center of the area of encephalomalacia possibly representing calcification is not significantly changed from 04/27/2022. Lacunar infarct left thalamus and both cerebellar hemispheres unchanged lenticular focal thickening of the posterior falx. Vascular: No hyperdense vessel. Intracranial arterial calcification. Skull: No fracture or focal lesion.  Right  parietal craniotomy Sinuses/Orbits: No acute finding. Paranasal sinuses and mastoid air cells are well aerated. Other: None. IMPRESSION: No acute intracranial abnormality. Multiple chronic findings as described in the body of the report. Electronically Signed   By: Placido Sou M.D.   On: 08/27/2022 21:59   DG Chest Port 1 View  Result Date: 08/27/2022 CLINICAL DATA:  Vomiting. EXAM: PORTABLE CHEST 1 VIEW COMPARISON:  April 26, 2022. FINDINGS: Stable cardiomegaly. Status post cardiac valve repair. Both lungs are clear. The visualized skeletal structures are unremarkable. IMPRESSION: No active disease. Electronically Signed   By: Marijo Conception M.D.   On: 08/27/2022 21:17      Subjective: No complaints.  No further nausea or vomiting.  Tolerated liquids yesterday and soft diet today.  No BM reported since 10/21 after enema.  Discharge Exam:  Vitals:   08/29/22 1427 08/29/22 1746 08/29/22 2016 08/30/22 0427  BP: (!) 129/95 123/78 136/89 (!) 144/87  Pulse: 64 70 70 69  Resp: '16 20 16 20  '$ Temp: 97.7 F (36.5 C) 98.1 F (36.7 C) (!) 97.5 F (36.4 C) 97.9 F (36.6 C)  TempSrc: Oral Oral Oral Oral  SpO2: 94% 94% 94% 97%  Weight:        General exam: Elderly female, moderately built, frail and chronically ill looking sitting up comfortably in reclining chair this morning Respiratory system: Clear to auscultation.  No increased work of breathing. Cardiovascular system: S1 & S2 heard, RRR. No JVD, murmurs, rubs, gallops or clicks. No pedal edema.  Telemetry personally reviewed: Low voltage, sinus rhythm, first-degree AV block with a few PVCs. Gastrointestinal system: Abdomen is nondistended, soft and nontender. No organomegaly or masses felt. Normal bowel sounds heard. Central nervous system: Alert and oriented. No focal neurological deficits. Extremities: 5 x 5 power in the right extremities and possible 4 x 5 power in the left extremities. Skin: No rashes, lesions or ulcers Psychiatry:  Judgement and insight appear normal. Mood & affect appropriate.    The results of significant diagnostics from this hospitalization (including imaging, microbiology, ancillary and laboratory) are listed below for reference.     Microbiology: Recent Results (from the past 240 hour(s))  Blood Culture (Routine X 2)     Status: None (Preliminary result)   Collection Time: 08/27/22  8:43 PM   Specimen: BLOOD  Result Value Ref Range Status   Specimen Description BLOOD LEFT ANTECUBITAL  Final   Special Requests   Final  BOTTLES DRAWN AEROBIC AND ANAEROBIC Blood Culture results may not be optimal due to an inadequate volume of blood received in culture bottles   Culture   Final    NO GROWTH 3 DAYS Performed at [facility], [place] [address] [place], [place] [address]    Report Status PENDING  Incomplete  Resp Panel by RT-PCR (Flu A&B, Covid)     Status: None   Collection Time: 08/27/22  8:47 PM   Specimen: Nasal Swab  Result Value Ref Range Status   SARS Coronavirus 2 by RT PCR NEGATIVE NEGATIVE Final    Comment: (NOTE) SARS-CoV-2 target nucleic acids are NOT DETECTED.  The SARS-CoV-2 RNA is generally detectable in upper respiratory specimens during the acute phase of infection. The lowest concentration of SARS-CoV-2 viral copies this assay can detect is 138 copies/mL. A negative result does not preclude SARS-Cov-2 infection and should not be used as the sole basis for treatment or other patient management decisions. A negative result may occur with  improper specimen collection/handling, submission of specimen other than nasopharyngeal swab, presence of viral mutation(s) within the areas targeted by this assay, and inadequate number of viral copies(<138 copies/mL). A negative result must be combined with clinical observations, patient history, and epidemiological information. The expected result is Negative.  Fact Sheet for Patients:   EntrepreneurPulse.com.au  Fact Sheet for Healthcare Providers:  IncredibleEmployment.be  This test is no t yet approved or cleared by the [place] FDA and  has been authorized for detection and/or diagnosis of SARS-CoV-2 by FDA under an Emergency Use Authorization (EUA). This EUA will remain  in effect (meaning this test can be used) for the duration of the COVID-19 declaration under Section 564(b)(1) of the Act, 21 U.S.C.section 360bbb-3(b)(1), unless the authorization is terminated  or revoked sooner.       Influenza A by PCR NEGATIVE NEGATIVE Final   Influenza B by PCR NEGATIVE NEGATIVE Final    Comment: (NOTE) The Xpert Xpress SARS-CoV-2/FLU/RSV plus assay is intended as an aid in the diagnosis of influenza from Nasopharyngeal swab specimens and should not be used as a sole basis for treatment. Nasal washings and aspirates are unacceptable for Xpert Xpress SARS-CoV-2/FLU/RSV testing.  Fact Sheet for Patients: EntrepreneurPulse.com.au  Fact Sheet for Healthcare Providers: IncredibleEmployment.be  This test is not yet approved or cleared by the [place] FDA and has been authorized for detection and/or diagnosis of SARS-CoV-2 by FDA under an Emergency Use Authorization (EUA). This EUA will remain in effect (meaning this test can be used) for the duration of the COVID-19 declaration under Section 564(b)(1) of the Act, 21 U.S.C. section 360bbb-3(b)(1), unless the authorization is terminated or revoked.  Performed at [facility], [place] [address], [place], [place] [address]   Blood Culture (Routine X 2)     Status: None (Preliminary result)   Collection Time: 08/27/22  8:48 PM   Specimen: BLOOD LEFT HAND  Result Value Ref Range Status   Specimen Description BLOOD LEFT HAND  Final   Special Requests   Final    BOTTLES DRAWN AEROBIC AND ANAEROBIC Blood Culture results may not be optimal  due to an inadequate volume of blood received in culture bottles   Culture   Final    NO GROWTH 3 DAYS Performed at [facility], [place] [address], [place], [place] [address]    Report Status PENDING  Incomplete     Labs: CBC: Recent Labs  Lab 08/27/22 2043 08/28/22 1520  WBC 6.7 6.4  NEUTROABS 5.4  --   HGB 11.7* 11.0*  HCT 37.0 33.3*  MCV 83.7 82.8  PLT 194 242    Basic Metabolic Panel: Recent Labs  Lab 08/27/22 2043 08/28/22 0438 08/28/22 1520 08/29/22 0251  NA 139 139  --  140  K 3.4* 4.1  --  3.5  CL 99 104  --  106  CO2 28 27  --  25  GLUCOSE 181* 168*  --  115*  BUN 18 19  --  11  CREATININE 0.84 0.71  --  0.69  CALCIUM 9.8 9.6  --  9.2  MG  --   --  1.7 2.2    Liver Function Tests: Recent Labs  Lab 08/27/22 2043  AST 18  ALT 15  ALKPHOS 95  BILITOT 0.7  PROT 6.3*  ALBUMIN 3.6    CBG: Recent Labs  Lab 08/27/22 2154  GLUCAP 160*      Time coordinating discharge: 25 minutes  SIGNED:  Vernell Leep, MD,  FACP, Section, [facility], [facility], [facility]   [facility] [place]     To contact the attending provider between 7A-7P or the covering provider during after hours 7P-7A, please log into the web site [email or web address] and access using universal [place] password for that web site. If you do not have the password, please call the hospital operator.

## 2022-08-30 NOTE — Progress Notes (Signed)
Physical Therapy Treatment Patient Details Name: [name] MRN: [id number] DOB: 1946-02-20 Today's Date: 08/30/2022   History of Present Illness [name] is a 76 y.o. female presents with nausea, vomiting and AMS - admitted with gastroenteritis. 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    PT Comments    Pt was premedicated for nausea and reports slight improvement.  In regards to mobility, pt requiring mod A to EOB and max A to attempt to stand but unable to further progress despite multiple trials due to not fully standing and fearful of falling. In regards to dizziness/nystagmus, pt had some difficulty providing exact history but does states worse with this illness and occurs with movements.  She has decreased mobility and very kyphotic/forward head so was not able to get into modified positions (even using bed features) for accurate [employer].  Horizontal Canal testing was negative.  Pt did have initial R beating nystagmus with R gaze but decreased on repeat trials.  She reported feeling like she was falling today but no dizziness.  Continue plan of care and monitoring for dizziness   Recommendations for follow up therapy are one component of a multi-disciplinary discharge planning process, led by the attending physician.  Recommendations may be updated based on patient status, additional functional criteria and insurance authorization.  Follow Up Recommendations  Skilled nursing-short term rehab (<3 hours/day) Can patient physically be transported by private vehicle: No   Assistance Recommended at Discharge Frequent or constant Supervision/Assistance  Patient can return home with the following A lot of help with walking and/or transfers;A lot of help with bathing/dressing/bathroom   Equipment Recommendations  None recommended by PT    Recommendations for Other  Services       Precautions / Restrictions Precautions Precautions: Fall     Mobility  Bed Mobility Overal bed mobility: Needs Assistance Bed Mobility: Supine to Sit, Sit to Supine     Supine to sit: Mod assist Sit to supine: Mod assist   General bed mobility comments: pt able to assist with legs but requiring light mod A to lift trunk and scoot forward    Transfers   Equipment used: Rolling walker (2 wheels) Transfers: Sit to/from Stand Sit to Stand: Max assist           General transfer comment: Attempted sit to stand x 4 with bed elevated.  Pt tending to push backward and unable to fully stand.  Slight improvemetn with feet blocked and visual target to lean forward to prior to standing, but still unable to fully stand.  Reports feels like she is falling    Ambulation/Gait                   Stairs             Wheelchair Mobility    Modified Rankin (Stroke Patients Only)       Balance Overall balance assessment: Needs assistance Sitting-balance support: Bilateral upper extremity supported, Feet supported Sitting balance-Leahy Scale: Poor Sitting balance - Comments: Initially L lateral lean requiring mod A to correct; improved to close min guard occasional min A ; EOB for 15 mins ; reports she feels self going L   Standing balance support: Bilateral upper extremity supported Standing balance-Leahy Scale: Zero Standing balance comment: unable to fully stand  Cognition Arousal/Alertness: Awake/alert Behavior During Therapy: WFL for tasks assessed/performed Overall Cognitive Status: History of cognitive impairments - at baseline                                 General Comments: Decreased memory and problem solving        Exercises      General Comments General comments (skin integrity, edema, etc.):  Noted R beating nystagmus with initial R gaze but decreased with repeat trials.     Attempted modified hall pike dix , but even with bed functions and pillows to promote correct positioning pt had difficulty getting to positions quickly and even to correct angle due to kyphotic and very forward head. Had no nystagmus or dizziness.  Horizontal roll test for BPPV was negative.    Pt reports some dizziness in past but not like this.  States felt like she was spinning at times with all movemtns.  Reports worst episode in elevator.  Pt admits poor memory and difficulty providing further details.    Gaze stabilization: pt had difficulty coordinating these movements  head turns: no dizziness;  Smooth Pursuit: choppy movements but eyes coordinated      Pertinent Vitals/Pain Pain Assessment Pain Assessment: No/denies pain    Home Living                          Prior Function            PT Goals (current goals can now be found in the care plan section) Progress towards PT goals: Not progressing toward goals - comment    Frequency    Min 3X/week      PT Plan Current plan remains appropriate    Co-evaluation              AM-PAC PT "6 Clicks" Mobility   Outcome Measure  Help needed turning from your back to your side while in a flat bed without using bedrails?: A Little Help needed moving from lying on your back to sitting on the side of a flat bed without using bedrails?: A Lot Help needed moving to and from a bed to a chair (including a wheelchair)?: A Lot Help needed standing up from a chair using your arms (e.g., wheelchair or bedside chair)?: A Lot Help needed to walk in hospital room?: Total Help needed climbing 3-5 steps with a railing? : Total 6 Click Score: 11    End of Session Equipment Utilized During Treatment: Gait belt Activity Tolerance: Patient tolerated treatment well Patient left: in bed;with call bell/phone within reach;with bed alarm set;with family/visitor present (largest complaint was gritty taste in mouth - left with  food present and provided toothbrush/paste for after) Nurse Communication: Mobility status PT Visit Diagnosis: Other abnormalities of gait and mobility (R26.89);Difficulty in walking, not elsewhere classified (R26.2);Dizziness and giddiness (R42)     Time: 9163-8466 PT Time Calculation (min) (ACUTE ONLY): 40 min  Charges:  $Therapeutic Activity: 23-37 mins $Canalith Rep Proc: 8-22 mins                     Abran Richard, PT Acute Rehab [facility] [phone]    Karlton Lemon 08/30/2022, 1:12 PM

## 2022-08-30 NOTE — TOC Transition Note (Signed)
Transition of Care [facility]) - CM/SW Discharge Note   Patient Details  Name: [name] MRN: [id number] Date of Birth: Oct 24, 1946  [facility] of [facility]) CM/SW Contact:  Joanne Chars, LCSW Phone Number: 08/30/2022, 4:11 PM   Clinical Narrative:   Pt discharging to [facility].  RN call [phone] for report.     Final next level of care: Skilled Nursing Facility Barriers to Discharge: Barriers Resolved   Patient Goals and CMS Choice Patient states their goals for this hospitalization and ongoing recovery are:: being independant   Choice offered to / list presented to : Patient (pt current resident at [facility], wants to return)  Discharge Placement              Patient chooses bed at:  [facility]) Patient to be transferred to facility by: [place] Name of family member notified: husband [name] Patient and family notified of of transfer: 08/30/22  Discharge Plan and Services In-house Referral: Clinical Social Work   Post Acute Care Choice: [place]                               Social Determinants of Health (SDOH) Interventions     Readmission Risk Interventions     No data to display

## 2022-08-30 NOTE — Progress Notes (Signed)
[place] for Warfarin Indication:  mechanical MVR and history of stroke  Allergies  Allergen Reactions   Zoloft [Sertraline] Hives, Itching and Rash    Patient Measurements: Vital Signs: Temp: 97.9 F (36.6 C) (10/23 0427) Temp Source: Oral (10/23 0427) BP: 144/87 (10/23 0427) Pulse Rate: 69 (10/23 0427)  Labs: Recent Labs    08/27/22 2043 08/27/22 2111 08/27/22 2221 08/28/22 0438 08/28/22 1520 08/29/22 0251 08/30/22 0511  HGB 11.7*  --   --   --  11.0*  --   --   HCT 37.0  --   --   --  33.3*  --   --   PLT 194  --   --   --  176  --   --   LABPROT  --    < >  --   --  43.9* 37.0* 32.7*  INR  --    < >  --   --  4.7* 3.8* 3.2*  CREATININE 0.84  --   --  0.71  --  0.69  --   TROPONINIHS 81*  --  82*  --   --   --   --    < > = values in this interval not displayed.     Estimated Creatinine Clearance: 46.3 mL/min (by C-G formula based on SCr of 0.69 mg/dL).   Assessment: 2 yof with a history of CVAs w/ residual left-sided hemiparesis, SAH, MR s/p mechanical mitral valve on Coumadin, HTN, GERD. Warfarin per pharmacy consult placed for  mechanical MVR and history of stroke . Hx of SAH, CT head is negative.  Patient taking warfarin prior to arrival. Home dose is Alternates '5mg'$  and '10mg'$  doses every other day. Last taken 10/20 1700 ('5mg'$ ) and 10/19 1700 ('10mg'$ ).   Patient is finishing 5 day macrobid course.  INR down to 3.2 today  Goal of Therapy:  INR Goal 2.5-3.5 Monitor platelets by anticoagulation protocol: Yes   Plan:  Warfarin 5 mg po x 1 dose tonight Monitor for s/s of hemorrhage, daily INR, CBC Watch for new DDIs, monitor PO intake  Thank you Anette Guarneri, PharmD 08/30/2022 10:34 AM

## 2022-08-30 NOTE — Discharge Instructions (Signed)

## 2022-08-31 DIAGNOSIS — R4701 Aphasia: Secondary | ICD-10-CM | POA: Diagnosis not present

## 2022-08-31 DIAGNOSIS — M6281 Muscle weakness (generalized): Secondary | ICD-10-CM | POA: Diagnosis not present

## 2022-08-31 DIAGNOSIS — R2681 Unsteadiness on feet: Secondary | ICD-10-CM | POA: Diagnosis not present

## 2022-08-31 DIAGNOSIS — A09 Infectious gastroenteritis and colitis, unspecified: Secondary | ICD-10-CM | POA: Diagnosis not present

## 2022-09-01 DIAGNOSIS — R4701 Aphasia: Secondary | ICD-10-CM | POA: Diagnosis not present

## 2022-09-01 DIAGNOSIS — D696 Thrombocytopenia, unspecified: Secondary | ICD-10-CM | POA: Diagnosis not present

## 2022-09-01 DIAGNOSIS — L89156 Pressure-induced deep tissue damage of sacral region: Secondary | ICD-10-CM | POA: Diagnosis not present

## 2022-09-01 DIAGNOSIS — A09 Infectious gastroenteritis and colitis, unspecified: Secondary | ICD-10-CM | POA: Diagnosis not present

## 2022-09-01 DIAGNOSIS — E569 Vitamin deficiency, unspecified: Secondary | ICD-10-CM | POA: Diagnosis not present

## 2022-09-01 DIAGNOSIS — M6281 Muscle weakness (generalized): Secondary | ICD-10-CM | POA: Diagnosis not present

## 2022-09-01 DIAGNOSIS — R2681 Unsteadiness on feet: Secondary | ICD-10-CM | POA: Diagnosis not present

## 2022-09-01 LAB — CULTURE, BLOOD (ROUTINE X 2)
Culture: NO GROWTH
Culture: NO GROWTH

## 2022-09-02 DIAGNOSIS — Z7901 Long term (current) use of anticoagulants: Secondary | ICD-10-CM | POA: Diagnosis not present

## 2022-09-02 DIAGNOSIS — R2681 Unsteadiness on feet: Secondary | ICD-10-CM | POA: Diagnosis not present

## 2022-09-02 DIAGNOSIS — G8194 Hemiplegia, unspecified affecting left nondominant side: Secondary | ICD-10-CM | POA: Diagnosis not present

## 2022-09-02 DIAGNOSIS — I10 Essential (primary) hypertension: Secondary | ICD-10-CM | POA: Diagnosis not present

## 2022-09-02 DIAGNOSIS — R4701 Aphasia: Secondary | ICD-10-CM | POA: Diagnosis not present

## 2022-09-02 DIAGNOSIS — A09 Infectious gastroenteritis and colitis, unspecified: Secondary | ICD-10-CM | POA: Diagnosis not present

## 2022-09-02 DIAGNOSIS — M6281 Muscle weakness (generalized): Secondary | ICD-10-CM | POA: Diagnosis not present

## 2022-09-02 DIAGNOSIS — R531 Weakness: Secondary | ICD-10-CM | POA: Diagnosis not present

## 2022-09-03 DIAGNOSIS — Z952 Presence of prosthetic heart valve: Secondary | ICD-10-CM | POA: Diagnosis not present

## 2022-09-03 DIAGNOSIS — A09 Infectious gastroenteritis and colitis, unspecified: Secondary | ICD-10-CM | POA: Diagnosis not present

## 2022-09-03 DIAGNOSIS — Z7901 Long term (current) use of anticoagulants: Secondary | ICD-10-CM | POA: Diagnosis not present

## 2022-09-03 DIAGNOSIS — E785 Hyperlipidemia, unspecified: Secondary | ICD-10-CM | POA: Diagnosis not present

## 2022-09-03 DIAGNOSIS — R2681 Unsteadiness on feet: Secondary | ICD-10-CM | POA: Diagnosis not present

## 2022-09-03 DIAGNOSIS — R4701 Aphasia: Secondary | ICD-10-CM | POA: Diagnosis not present

## 2022-09-03 DIAGNOSIS — Z681 Body mass index (BMI) 19 or less, adult: Secondary | ICD-10-CM | POA: Diagnosis not present

## 2022-09-03 DIAGNOSIS — F4321 Adjustment disorder with depressed mood: Secondary | ICD-10-CM | POA: Diagnosis not present

## 2022-09-03 DIAGNOSIS — I10 Essential (primary) hypertension: Secondary | ICD-10-CM | POA: Diagnosis not present

## 2022-09-03 DIAGNOSIS — I69354 Hemiplegia and hemiparesis following cerebral infarction affecting left non-dominant side: Secondary | ICD-10-CM | POA: Diagnosis not present

## 2022-09-03 DIAGNOSIS — M6281 Muscle weakness (generalized): Secondary | ICD-10-CM | POA: Diagnosis not present

## 2022-09-03 DIAGNOSIS — R52 Pain, unspecified: Secondary | ICD-10-CM | POA: Diagnosis not present

## 2022-09-03 DIAGNOSIS — M818 Other osteoporosis without current pathological fracture: Secondary | ICD-10-CM | POA: Diagnosis not present

## 2022-09-03 DIAGNOSIS — K5900 Constipation, unspecified: Secondary | ICD-10-CM | POA: Diagnosis not present

## 2022-09-06 ENCOUNTER — Other Ambulatory Visit: Payer: Self-pay | Admitting: *Deleted

## 2022-09-06 DIAGNOSIS — R7303 Prediabetes: Secondary | ICD-10-CM | POA: Diagnosis not present

## 2022-09-06 DIAGNOSIS — F4323 Adjustment disorder with mixed anxiety and depressed mood: Secondary | ICD-10-CM | POA: Diagnosis not present

## 2022-09-06 DIAGNOSIS — N182 Chronic kidney disease, stage 2 (mild): Secondary | ICD-10-CM | POA: Diagnosis not present

## 2022-09-06 DIAGNOSIS — M6281 Muscle weakness (generalized): Secondary | ICD-10-CM | POA: Diagnosis not present

## 2022-09-06 DIAGNOSIS — A09 Infectious gastroenteritis and colitis, unspecified: Secondary | ICD-10-CM | POA: Diagnosis not present

## 2022-09-06 DIAGNOSIS — R2681 Unsteadiness on feet: Secondary | ICD-10-CM | POA: Diagnosis not present

## 2022-09-06 DIAGNOSIS — R4701 Aphasia: Secondary | ICD-10-CM | POA: Diagnosis not present

## 2022-09-06 DIAGNOSIS — E860 Dehydration: Secondary | ICD-10-CM | POA: Diagnosis not present

## 2022-09-06 NOTE — Patient Outreach (Signed)
[name] resides in [facility] per [facility]. Screening for potential [facility] care coordination services as benefit of insurance plan and PCP.  Update received from [place], [place] social worker. [name] is from [facility]. Unsure when she will return.   Will continue to follow.    Marthenia Rolling, MSN, RN,BSN [place] Acute Care Coordinator [phone] (Direct dial)

## 2022-09-07 DIAGNOSIS — R2681 Unsteadiness on feet: Secondary | ICD-10-CM | POA: Diagnosis not present

## 2022-09-07 DIAGNOSIS — A09 Infectious gastroenteritis and colitis, unspecified: Secondary | ICD-10-CM | POA: Diagnosis not present

## 2022-09-07 DIAGNOSIS — M6281 Muscle weakness (generalized): Secondary | ICD-10-CM | POA: Diagnosis not present

## 2022-09-07 DIAGNOSIS — R4701 Aphasia: Secondary | ICD-10-CM | POA: Diagnosis not present

## 2022-09-08 DIAGNOSIS — A09 Infectious gastroenteritis and colitis, unspecified: Secondary | ICD-10-CM | POA: Diagnosis not present

## 2022-09-08 DIAGNOSIS — I609 Nontraumatic subarachnoid hemorrhage, unspecified: Secondary | ICD-10-CM | POA: Diagnosis not present

## 2022-09-08 DIAGNOSIS — I639 Cerebral infarction, unspecified: Secondary | ICD-10-CM | POA: Diagnosis not present

## 2022-09-08 DIAGNOSIS — R2689 Other abnormalities of gait and mobility: Secondary | ICD-10-CM | POA: Diagnosis not present

## 2022-09-08 DIAGNOSIS — M6281 Muscle weakness (generalized): Secondary | ICD-10-CM | POA: Diagnosis not present

## 2022-09-08 DIAGNOSIS — R41841 Cognitive communication deficit: Secondary | ICD-10-CM | POA: Diagnosis not present

## 2022-09-09 DIAGNOSIS — A09 Infectious gastroenteritis and colitis, unspecified: Secondary | ICD-10-CM | POA: Diagnosis not present

## 2022-09-09 DIAGNOSIS — I609 Nontraumatic subarachnoid hemorrhage, unspecified: Secondary | ICD-10-CM | POA: Diagnosis not present

## 2022-09-09 DIAGNOSIS — R41841 Cognitive communication deficit: Secondary | ICD-10-CM | POA: Diagnosis not present

## 2022-09-09 DIAGNOSIS — R2689 Other abnormalities of gait and mobility: Secondary | ICD-10-CM | POA: Diagnosis not present

## 2022-09-09 DIAGNOSIS — I639 Cerebral infarction, unspecified: Secondary | ICD-10-CM | POA: Diagnosis not present

## 2022-09-09 DIAGNOSIS — M6281 Muscle weakness (generalized): Secondary | ICD-10-CM | POA: Diagnosis not present

## 2022-09-10 DIAGNOSIS — A09 Infectious gastroenteritis and colitis, unspecified: Secondary | ICD-10-CM | POA: Diagnosis not present

## 2022-09-10 DIAGNOSIS — I639 Cerebral infarction, unspecified: Secondary | ICD-10-CM | POA: Diagnosis not present

## 2022-09-10 DIAGNOSIS — R41841 Cognitive communication deficit: Secondary | ICD-10-CM | POA: Diagnosis not present

## 2022-09-10 DIAGNOSIS — R2689 Other abnormalities of gait and mobility: Secondary | ICD-10-CM | POA: Diagnosis not present

## 2022-09-10 DIAGNOSIS — I609 Nontraumatic subarachnoid hemorrhage, unspecified: Secondary | ICD-10-CM | POA: Diagnosis not present

## 2022-09-10 DIAGNOSIS — M6281 Muscle weakness (generalized): Secondary | ICD-10-CM | POA: Diagnosis not present

## 2022-09-13 DIAGNOSIS — A09 Infectious gastroenteritis and colitis, unspecified: Secondary | ICD-10-CM | POA: Diagnosis not present

## 2022-09-13 DIAGNOSIS — R41841 Cognitive communication deficit: Secondary | ICD-10-CM | POA: Diagnosis not present

## 2022-09-13 DIAGNOSIS — R2689 Other abnormalities of gait and mobility: Secondary | ICD-10-CM | POA: Diagnosis not present

## 2022-09-13 DIAGNOSIS — I609 Nontraumatic subarachnoid hemorrhage, unspecified: Secondary | ICD-10-CM | POA: Diagnosis not present

## 2022-09-13 DIAGNOSIS — I639 Cerebral infarction, unspecified: Secondary | ICD-10-CM | POA: Diagnosis not present

## 2022-09-13 DIAGNOSIS — M6281 Muscle weakness (generalized): Secondary | ICD-10-CM | POA: Diagnosis not present

## 2022-09-14 DIAGNOSIS — R2689 Other abnormalities of gait and mobility: Secondary | ICD-10-CM | POA: Diagnosis not present

## 2022-09-14 DIAGNOSIS — A09 Infectious gastroenteritis and colitis, unspecified: Secondary | ICD-10-CM | POA: Diagnosis not present

## 2022-09-14 DIAGNOSIS — M6281 Muscle weakness (generalized): Secondary | ICD-10-CM | POA: Diagnosis not present

## 2022-09-14 DIAGNOSIS — I609 Nontraumatic subarachnoid hemorrhage, unspecified: Secondary | ICD-10-CM | POA: Diagnosis not present

## 2022-09-14 DIAGNOSIS — R41841 Cognitive communication deficit: Secondary | ICD-10-CM | POA: Diagnosis not present

## 2022-09-14 DIAGNOSIS — I639 Cerebral infarction, unspecified: Secondary | ICD-10-CM | POA: Diagnosis not present

## 2022-09-15 DIAGNOSIS — M6281 Muscle weakness (generalized): Secondary | ICD-10-CM | POA: Diagnosis not present

## 2022-09-15 DIAGNOSIS — R2689 Other abnormalities of gait and mobility: Secondary | ICD-10-CM | POA: Diagnosis not present

## 2022-09-15 DIAGNOSIS — R41841 Cognitive communication deficit: Secondary | ICD-10-CM | POA: Diagnosis not present

## 2022-09-15 DIAGNOSIS — I609 Nontraumatic subarachnoid hemorrhage, unspecified: Secondary | ICD-10-CM | POA: Diagnosis not present

## 2022-09-15 DIAGNOSIS — I639 Cerebral infarction, unspecified: Secondary | ICD-10-CM | POA: Diagnosis not present

## 2022-09-15 DIAGNOSIS — A09 Infectious gastroenteritis and colitis, unspecified: Secondary | ICD-10-CM | POA: Diagnosis not present

## 2022-09-16 DIAGNOSIS — I609 Nontraumatic subarachnoid hemorrhage, unspecified: Secondary | ICD-10-CM | POA: Diagnosis not present

## 2022-09-16 DIAGNOSIS — I639 Cerebral infarction, unspecified: Secondary | ICD-10-CM | POA: Diagnosis not present

## 2022-09-16 DIAGNOSIS — K219 Gastro-esophageal reflux disease without esophagitis: Secondary | ICD-10-CM | POA: Diagnosis not present

## 2022-09-16 DIAGNOSIS — R41841 Cognitive communication deficit: Secondary | ICD-10-CM | POA: Diagnosis not present

## 2022-09-16 DIAGNOSIS — R634 Abnormal weight loss: Secondary | ICD-10-CM | POA: Diagnosis not present

## 2022-09-16 DIAGNOSIS — R2689 Other abnormalities of gait and mobility: Secondary | ICD-10-CM | POA: Diagnosis not present

## 2022-09-16 DIAGNOSIS — A09 Infectious gastroenteritis and colitis, unspecified: Secondary | ICD-10-CM | POA: Diagnosis not present

## 2022-09-16 DIAGNOSIS — M6281 Muscle weakness (generalized): Secondary | ICD-10-CM | POA: Diagnosis not present

## 2022-09-17 ENCOUNTER — Ambulatory Visit: Payer: Medicare Other | Admitting: Physical Medicine & Rehabilitation

## 2022-09-17 DIAGNOSIS — R41841 Cognitive communication deficit: Secondary | ICD-10-CM | POA: Diagnosis not present

## 2022-09-17 DIAGNOSIS — I609 Nontraumatic subarachnoid hemorrhage, unspecified: Secondary | ICD-10-CM | POA: Diagnosis not present

## 2022-09-17 DIAGNOSIS — A09 Infectious gastroenteritis and colitis, unspecified: Secondary | ICD-10-CM | POA: Diagnosis not present

## 2022-09-17 DIAGNOSIS — M6281 Muscle weakness (generalized): Secondary | ICD-10-CM | POA: Diagnosis not present

## 2022-09-17 DIAGNOSIS — R2689 Other abnormalities of gait and mobility: Secondary | ICD-10-CM | POA: Diagnosis not present

## 2022-09-17 DIAGNOSIS — I639 Cerebral infarction, unspecified: Secondary | ICD-10-CM | POA: Diagnosis not present

## 2022-09-20 ENCOUNTER — Other Ambulatory Visit: Payer: Self-pay | Admitting: *Deleted

## 2022-09-20 DIAGNOSIS — I639 Cerebral infarction, unspecified: Secondary | ICD-10-CM | POA: Diagnosis not present

## 2022-09-20 DIAGNOSIS — R41841 Cognitive communication deficit: Secondary | ICD-10-CM | POA: Diagnosis not present

## 2022-09-20 DIAGNOSIS — I609 Nontraumatic subarachnoid hemorrhage, unspecified: Secondary | ICD-10-CM | POA: Diagnosis not present

## 2022-09-20 DIAGNOSIS — M6281 Muscle weakness (generalized): Secondary | ICD-10-CM | POA: Diagnosis not present

## 2022-09-20 DIAGNOSIS — A09 Infectious gastroenteritis and colitis, unspecified: Secondary | ICD-10-CM | POA: Diagnosis not present

## 2022-09-20 DIAGNOSIS — R2689 Other abnormalities of gait and mobility: Secondary | ICD-10-CM | POA: Diagnosis not present

## 2022-09-20 NOTE — Patient Outreach (Signed)
Per [facility] Mrs. Cheyney resides in [facility]. Screening for potential [facility] care coordination services as benefit of insurance plan and PCP.   Confirmed with [facility] social worker, Mrs. Hixon is under private pay at [facility].   No identifiable [facility] care coordination needs identified at this time.   Marthenia Rolling, MSN, RN,BSN [place] Acute Care Coordinator [phone] (Direct dial)

## 2022-09-21 DIAGNOSIS — A09 Infectious gastroenteritis and colitis, unspecified: Secondary | ICD-10-CM | POA: Diagnosis not present

## 2022-09-21 DIAGNOSIS — R41841 Cognitive communication deficit: Secondary | ICD-10-CM | POA: Diagnosis not present

## 2022-09-21 DIAGNOSIS — R2689 Other abnormalities of gait and mobility: Secondary | ICD-10-CM | POA: Diagnosis not present

## 2022-09-21 DIAGNOSIS — I639 Cerebral infarction, unspecified: Secondary | ICD-10-CM | POA: Diagnosis not present

## 2022-09-21 DIAGNOSIS — M6281 Muscle weakness (generalized): Secondary | ICD-10-CM | POA: Diagnosis not present

## 2022-09-21 DIAGNOSIS — I609 Nontraumatic subarachnoid hemorrhage, unspecified: Secondary | ICD-10-CM | POA: Diagnosis not present

## 2022-09-22 DIAGNOSIS — R41841 Cognitive communication deficit: Secondary | ICD-10-CM | POA: Diagnosis not present

## 2022-09-22 DIAGNOSIS — M6281 Muscle weakness (generalized): Secondary | ICD-10-CM | POA: Diagnosis not present

## 2022-09-22 DIAGNOSIS — I609 Nontraumatic subarachnoid hemorrhage, unspecified: Secondary | ICD-10-CM | POA: Diagnosis not present

## 2022-09-22 DIAGNOSIS — I639 Cerebral infarction, unspecified: Secondary | ICD-10-CM | POA: Diagnosis not present

## 2022-09-22 DIAGNOSIS — A09 Infectious gastroenteritis and colitis, unspecified: Secondary | ICD-10-CM | POA: Diagnosis not present

## 2022-09-22 DIAGNOSIS — R2689 Other abnormalities of gait and mobility: Secondary | ICD-10-CM | POA: Diagnosis not present

## 2022-09-23 DIAGNOSIS — R110 Nausea: Secondary | ICD-10-CM | POA: Diagnosis not present

## 2022-09-23 DIAGNOSIS — Z7689 Persons encountering health services in other specified circumstances: Secondary | ICD-10-CM | POA: Diagnosis not present

## 2022-09-23 DIAGNOSIS — Z7901 Long term (current) use of anticoagulants: Secondary | ICD-10-CM | POA: Diagnosis not present

## 2022-09-23 DIAGNOSIS — R634 Abnormal weight loss: Secondary | ICD-10-CM | POA: Diagnosis not present

## 2022-09-23 DIAGNOSIS — R2689 Other abnormalities of gait and mobility: Secondary | ICD-10-CM | POA: Diagnosis not present

## 2022-09-23 DIAGNOSIS — M6281 Muscle weakness (generalized): Secondary | ICD-10-CM | POA: Diagnosis not present

## 2022-09-23 DIAGNOSIS — R41841 Cognitive communication deficit: Secondary | ICD-10-CM | POA: Diagnosis not present

## 2022-09-23 DIAGNOSIS — I609 Nontraumatic subarachnoid hemorrhage, unspecified: Secondary | ICD-10-CM | POA: Diagnosis not present

## 2022-09-23 DIAGNOSIS — R630 Anorexia: Secondary | ICD-10-CM | POA: Diagnosis not present

## 2022-09-23 DIAGNOSIS — I639 Cerebral infarction, unspecified: Secondary | ICD-10-CM | POA: Diagnosis not present

## 2022-09-23 DIAGNOSIS — Z952 Presence of prosthetic heart valve: Secondary | ICD-10-CM | POA: Diagnosis not present

## 2022-09-23 DIAGNOSIS — A09 Infectious gastroenteritis and colitis, unspecified: Secondary | ICD-10-CM | POA: Diagnosis not present

## 2022-09-24 ENCOUNTER — Encounter: Payer: Medicare Other | Attending: Physical Medicine & Rehabilitation | Admitting: Physical Medicine & Rehabilitation

## 2022-09-24 ENCOUNTER — Encounter: Payer: Self-pay | Admitting: Physical Medicine & Rehabilitation

## 2022-09-24 VITALS — BP 112/77 | HR 99

## 2022-09-24 DIAGNOSIS — M6281 Muscle weakness (generalized): Secondary | ICD-10-CM | POA: Diagnosis not present

## 2022-09-24 DIAGNOSIS — I69359 Hemiplegia and hemiparesis following cerebral infarction affecting unspecified side: Secondary | ICD-10-CM | POA: Insufficient documentation

## 2022-09-24 DIAGNOSIS — I6300 Cerebral infarction due to thrombosis of unspecified precerebral artery: Secondary | ICD-10-CM

## 2022-09-24 DIAGNOSIS — I69319 Unspecified symptoms and signs involving cognitive functions following cerebral infarction: Secondary | ICD-10-CM | POA: Insufficient documentation

## 2022-09-24 DIAGNOSIS — I639 Cerebral infarction, unspecified: Secondary | ICD-10-CM | POA: Diagnosis not present

## 2022-09-24 DIAGNOSIS — I609 Nontraumatic subarachnoid hemorrhage, unspecified: Secondary | ICD-10-CM | POA: Diagnosis not present

## 2022-09-24 DIAGNOSIS — R41841 Cognitive communication deficit: Secondary | ICD-10-CM | POA: Diagnosis not present

## 2022-09-24 DIAGNOSIS — R2689 Other abnormalities of gait and mobility: Secondary | ICD-10-CM | POA: Diagnosis not present

## 2022-09-24 DIAGNOSIS — A09 Infectious gastroenteritis and colitis, unspecified: Secondary | ICD-10-CM | POA: Diagnosis not present

## 2022-09-24 NOTE — Progress Notes (Signed)
Subjective:    Patient ID: [name], female    DOB: 03-11-1946, 76 y.o.   MRN: [id number] 76 y.o. 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  Per chart review patient lives with spouse.  Generally modified independent with mobility.  Presented 04/26/2022 with acute onset of slurred speech and altered mental status.  Family denied any recent trauma or fall.  Patient does have a history of chronic hip pain was scheduled to have injection 04/29/2022 thus her Coumadin had been held since 04/23/2022 and she had been placed on Lovenox bridge injections 1 mg/kilogram twice daily administered by her husband.  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  Chronic infarction within the left thalamus and bilateral cerebellar hemispheres.  CT angiogram of head and neck no hemodynamically significant stenosis occlusion dissection or aneurysm.  Admission chemistries unremarkable except glucose 155 BUN 26 hemoglobin A1c 6.1, ammonia levels within normal limits urine drug screen negative.  Hospital course discussed with neurosurgery Dr. Annette Stable with conservative care repeat cranial CT scan 04/27/2022 again showing moderate amount of subarachnoid hemorrhage in the left parietal cortex with slight interval increase.  Small amount of subarachnoid hemorrhage in the right parietal cortex not changed.  Plan on resuming Coumadin with Lovenox bridge 04/29/2022.  Therapy evaluations completed due to patient decreased functional mobility was admitted for a comprehensive rehab  program.   Admit date: 04/30/2022 Discharge date: 06/01/2022 HPI  Sleeping ok , getting PT, OT SLP at least 3 d per week,   Per husband pt is getting 3-5 d per wk, pt forgets some sessions ,  Sees MD on occasion at facility   Needs help with dressing  Sponge bath in bed  New skilled nursing facility going up at [facility]   Husband lives in a independent living section of [facility]. Pain Inventory Average Pain 1 Pain Right Now 1 My pain is intermittent and not sure  LOCATION OF PAIN  thigh  BOWEL Number of stools per week: 3 Oral laxative use Yes  Type of laxative miralax, senna Enema or suppository use No  History of colostomy No  Incontinent No   BLADDER Normal In and out cath, frequency . Able to self cath . Bladder incontinence No  Frequent urination No  Leakage with coughing No  Difficulty starting stream No  Incomplete bladder emptying No    Mobility ability to climb steps?  no do you drive?  no use a wheelchair  Function not employed: date last employed . I need assistance with the following:  bathing and toileting  Neuro/Psych bowel control problems  Prior Studies Any changes since last visit?  no  Physicians involved in your care Any changes since last visit?  no   Family History  Problem Relation Age of Onset   Cancer Mother        Bone   Heart disease Mother    Hyperlipidemia Mother    Hypertension Mother    Stroke Father    Hypertension Father    Heart attack Neg Hx    Social History   Socioeconomic History   Marital status: Married    Spouse name: Not on file  Number of children: 2   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   Highest education level: Not on file  Occupational History   Occupation: Retired  Tobacco Use   Smoking status: Never    Passive exposure: Never   Smokeless tobacco: Never  Vaping Use   Vaping Use: Never used  Substance and Sexual Activity   Alcohol use: No    Alcohol/week: 0.0 standard drinks  of alcohol   Drug use: No   Sexual activity: Not Currently    Partners: Male    Comment: Married  Other Topics Concern   Not on file  Social History Narrative   Lives at home w/ her husband   Right-handed   Caffeine: none   Social Determinants of Health   Financial Resource Strain: Not on file  Food Insecurity: No Food Insecurity (07/18/2020)   Hunger Vital Sign    Worried About Running Out of Food in the Last Year: Never true    Ran Out of Food in the Last Year: Never true  Transportation Needs: No Transportation Needs (07/18/2020)   PRAPARE - Hydrologist (Medical): No    Lack of Transportation (Non-Medical): No  Physical Activity: Not on file  Stress: Not on file  Social Connections: Not on file   Past Surgical History:  Procedure Laterality Date   [place]   Past Medical History:  Diagnosis Date   Abnormality of gait 09/07/2016   Allergy    Diabetes mellitus without complication ([place])    Patient denies this - notes history of glucose intolerance   GERD (gastroesophageal reflux disease)    Hemiparesis and alteration of sensations as late effects of stroke ([place]) 09/07/2016   History of pneumonia 1997   Hypertension    S/P MVR (mitral valve replacement)    Mechanical mitral valve replacement at age 70 (done in [place])  // echo 7/17: EF 55-60, normal wall motion, bileaflet mechanical mitral valve prosthesis functioning normally, mild LAE, mildly reduced RVSF, small pericardial effusion   Stroke ([place]) 1997, 2013, 2015   BP 112/77   Pulse 99   SpO2 95%   Opioid Risk Score:   Fall Risk Score:  `1  Depression screen [facility] 2/9     07/20/2022   11:44 AM 03/23/2022   11:06 AM 12/29/2021    9:21 AM 06/09/2021    2:01 PM 09/12/2020    1:32 PM 07/29/2020    1:54 PM 07/18/2020    1:15 PM  Depression screen PHQ 2/9  Decreased Interest 0  0 1 0 0 0 0  Down, Depressed, Hopeless 0 1 1 0 0 0 0  PHQ - 2 Score 0 1 2 0 0 0 0  Altered sleeping 0     1   Tired, decreased energy 0     0   Change in appetite 0     0   Feeling bad or failure about yourself  0     0   Trouble concentrating 0     0   Moving slowly or fidgety/restless 0     0   Suicidal thoughts 0     0   PHQ-9 Score 0     1   Difficult doing work/chores Not difficult at all  Review of Systems     Objective:   Physical Exam Vitals and nursing note reviewed.  Constitutional:      Appearance: She is ill-appearing.  HENT:     Head: Normocephalic and atraumatic.  Musculoskeletal:     Comments: No pain with left upper extremity range of motion  Skin:    General: Skin is warm and dry.     Comments: Greenish bruising over the left forearm  Neurological:     Mental Status: She is alert and oriented to person, place, and time.  Psychiatric:        Mood and Affect: Mood normal.        Behavior: Behavior normal.    MAS 2/3 in the left elbow flexors and finger flexors. No clonus at the left ankle. Speech with mild dysarthria.  Oriented to person and place but not day of week   Confabulatory       Assessment & Plan:   1.  History of multiple strokes most recently her subarachnoid hemorrhage. She is now in a skilled nursing facility does get regular PT OT speech.  I think that is appropriate. We discussed tone management would stay away from oral antispasticity medications due to potential of aggravating cognitive issues. Would recommend Botox if the spasticity continues to worsen elbow and finger flexors.  We will see her back in 3 months to continue monitoring.  We discussed that spasticity is an issue poststroke that can worsen even years after initial stroke.

## 2022-09-24 NOTE — Patient Instructions (Signed)
Consider Botox in

## 2022-09-25 DIAGNOSIS — I639 Cerebral infarction, unspecified: Secondary | ICD-10-CM | POA: Diagnosis not present

## 2022-09-25 DIAGNOSIS — R41841 Cognitive communication deficit: Secondary | ICD-10-CM | POA: Diagnosis not present

## 2022-09-25 DIAGNOSIS — R2689 Other abnormalities of gait and mobility: Secondary | ICD-10-CM | POA: Diagnosis not present

## 2022-09-25 DIAGNOSIS — A09 Infectious gastroenteritis and colitis, unspecified: Secondary | ICD-10-CM | POA: Diagnosis not present

## 2022-09-25 DIAGNOSIS — I609 Nontraumatic subarachnoid hemorrhage, unspecified: Secondary | ICD-10-CM | POA: Diagnosis not present

## 2022-09-25 DIAGNOSIS — M6281 Muscle weakness (generalized): Secondary | ICD-10-CM | POA: Diagnosis not present

## 2022-09-27 DIAGNOSIS — R41841 Cognitive communication deficit: Secondary | ICD-10-CM | POA: Diagnosis not present

## 2022-09-27 DIAGNOSIS — R2689 Other abnormalities of gait and mobility: Secondary | ICD-10-CM | POA: Diagnosis not present

## 2022-09-27 DIAGNOSIS — M6281 Muscle weakness (generalized): Secondary | ICD-10-CM | POA: Diagnosis not present

## 2022-09-27 DIAGNOSIS — I639 Cerebral infarction, unspecified: Secondary | ICD-10-CM | POA: Diagnosis not present

## 2022-09-27 DIAGNOSIS — A09 Infectious gastroenteritis and colitis, unspecified: Secondary | ICD-10-CM | POA: Diagnosis not present

## 2022-09-27 DIAGNOSIS — I609 Nontraumatic subarachnoid hemorrhage, unspecified: Secondary | ICD-10-CM | POA: Diagnosis not present

## 2022-09-28 ENCOUNTER — Inpatient Hospital Stay (HOSPITAL_COMMUNITY): Payer: Medicare Other

## 2022-09-28 ENCOUNTER — Other Ambulatory Visit: Payer: Self-pay

## 2022-09-28 ENCOUNTER — Emergency Department (HOSPITAL_COMMUNITY): Payer: Medicare Other

## 2022-09-28 ENCOUNTER — Inpatient Hospital Stay (HOSPITAL_COMMUNITY)
Admission: EM | Admit: 2022-09-28 | Discharge: 2022-11-08 | DRG: 064 | Disposition: E | Payer: Medicare Other | Source: Skilled Nursing Facility | Attending: Internal Medicine | Admitting: Internal Medicine

## 2022-09-28 ENCOUNTER — Encounter (HOSPITAL_COMMUNITY): Payer: Medicare Other

## 2022-09-28 ENCOUNTER — Encounter (HOSPITAL_COMMUNITY): Payer: Self-pay | Admitting: Internal Medicine

## 2022-09-28 DIAGNOSIS — M6281 Muscle weakness (generalized): Secondary | ICD-10-CM | POA: Diagnosis not present

## 2022-09-28 DIAGNOSIS — I959 Hypotension, unspecified: Secondary | ICD-10-CM | POA: Diagnosis not present

## 2022-09-28 DIAGNOSIS — R791 Abnormal coagulation profile: Secondary | ICD-10-CM

## 2022-09-28 DIAGNOSIS — I472 Ventricular tachycardia, unspecified: Secondary | ICD-10-CM | POA: Diagnosis not present

## 2022-09-28 DIAGNOSIS — Z90710 Acquired absence of both cervix and uterus: Secondary | ICD-10-CM

## 2022-09-28 DIAGNOSIS — Z66 Do not resuscitate: Secondary | ICD-10-CM

## 2022-09-28 DIAGNOSIS — D649 Anemia, unspecified: Secondary | ICD-10-CM | POA: Diagnosis not present

## 2022-09-28 DIAGNOSIS — R471 Dysarthria and anarthria: Secondary | ICD-10-CM | POA: Diagnosis not present

## 2022-09-28 DIAGNOSIS — D696 Thrombocytopenia, unspecified: Secondary | ICD-10-CM | POA: Diagnosis not present

## 2022-09-28 DIAGNOSIS — R41841 Cognitive communication deficit: Secondary | ICD-10-CM | POA: Diagnosis not present

## 2022-09-28 DIAGNOSIS — I3139 Other pericardial effusion (noninflammatory): Secondary | ICD-10-CM | POA: Diagnosis present

## 2022-09-28 DIAGNOSIS — E43 Unspecified severe protein-calorie malnutrition: Secondary | ICD-10-CM | POA: Diagnosis not present

## 2022-09-28 DIAGNOSIS — Z681 Body mass index (BMI) 19 or less, adult: Secondary | ICD-10-CM | POA: Diagnosis not present

## 2022-09-28 DIAGNOSIS — Z7189 Other specified counseling: Secondary | ICD-10-CM | POA: Diagnosis not present

## 2022-09-28 DIAGNOSIS — R569 Unspecified convulsions: Secondary | ICD-10-CM | POA: Diagnosis not present

## 2022-09-28 DIAGNOSIS — G8191 Hemiplegia, unspecified affecting right dominant side: Secondary | ICD-10-CM | POA: Diagnosis not present

## 2022-09-28 DIAGNOSIS — Z8249 Family history of ischemic heart disease and other diseases of the circulatory system: Secondary | ICD-10-CM

## 2022-09-28 DIAGNOSIS — K219 Gastro-esophageal reflux disease without esophagitis: Secondary | ICD-10-CM | POA: Diagnosis not present

## 2022-09-28 DIAGNOSIS — R1312 Dysphagia, oropharyngeal phase: Secondary | ICD-10-CM | POA: Diagnosis present

## 2022-09-28 DIAGNOSIS — F4321 Adjustment disorder with depressed mood: Secondary | ICD-10-CM | POA: Diagnosis not present

## 2022-09-28 DIAGNOSIS — Z952 Presence of prosthetic heart valve: Secondary | ICD-10-CM

## 2022-09-28 DIAGNOSIS — I10 Essential (primary) hypertension: Secondary | ICD-10-CM | POA: Diagnosis not present

## 2022-09-28 DIAGNOSIS — Z7901 Long term (current) use of anticoagulants: Secondary | ICD-10-CM | POA: Diagnosis not present

## 2022-09-28 DIAGNOSIS — R2689 Other abnormalities of gait and mobility: Secondary | ICD-10-CM | POA: Diagnosis not present

## 2022-09-28 DIAGNOSIS — R638 Other symptoms and signs concerning food and fluid intake: Secondary | ICD-10-CM | POA: Diagnosis not present

## 2022-09-28 DIAGNOSIS — R4701 Aphasia: Secondary | ICD-10-CM | POA: Diagnosis present

## 2022-09-28 DIAGNOSIS — R0600 Dyspnea, unspecified: Secondary | ICD-10-CM | POA: Diagnosis not present

## 2022-09-28 DIAGNOSIS — Z789 Other specified health status: Secondary | ICD-10-CM

## 2022-09-28 DIAGNOSIS — I63332 Cerebral infarction due to thrombosis of left posterior cerebral artery: Secondary | ICD-10-CM | POA: Diagnosis present

## 2022-09-28 DIAGNOSIS — E119 Type 2 diabetes mellitus without complications: Secondary | ICD-10-CM

## 2022-09-28 DIAGNOSIS — Z888 Allergy status to other drugs, medicaments and biological substances status: Secondary | ICD-10-CM

## 2022-09-28 DIAGNOSIS — G9341 Metabolic encephalopathy: Secondary | ICD-10-CM | POA: Diagnosis not present

## 2022-09-28 DIAGNOSIS — H518 Other specified disorders of binocular movement: Secondary | ICD-10-CM | POA: Diagnosis present

## 2022-09-28 DIAGNOSIS — A09 Infectious gastroenteritis and colitis, unspecified: Secondary | ICD-10-CM | POA: Diagnosis not present

## 2022-09-28 DIAGNOSIS — R29810 Facial weakness: Secondary | ICD-10-CM | POA: Diagnosis not present

## 2022-09-28 DIAGNOSIS — I6523 Occlusion and stenosis of bilateral carotid arteries: Secondary | ICD-10-CM | POA: Diagnosis not present

## 2022-09-28 DIAGNOSIS — I4729 Other ventricular tachycardia: Secondary | ICD-10-CM | POA: Diagnosis not present

## 2022-09-28 DIAGNOSIS — R4182 Altered mental status, unspecified: Secondary | ICD-10-CM

## 2022-09-28 DIAGNOSIS — R531 Weakness: Secondary | ICD-10-CM | POA: Diagnosis not present

## 2022-09-28 DIAGNOSIS — I69354 Hemiplegia and hemiparesis following cerebral infarction affecting left non-dominant side: Secondary | ICD-10-CM

## 2022-09-28 DIAGNOSIS — Z4682 Encounter for fitting and adjustment of non-vascular catheter: Secondary | ICD-10-CM | POA: Diagnosis not present

## 2022-09-28 DIAGNOSIS — Z79899 Other long term (current) drug therapy: Secondary | ICD-10-CM

## 2022-09-28 DIAGNOSIS — E876 Hypokalemia: Secondary | ICD-10-CM | POA: Diagnosis not present

## 2022-09-28 DIAGNOSIS — I6389 Other cerebral infarction: Secondary | ICD-10-CM

## 2022-09-28 DIAGNOSIS — I609 Nontraumatic subarachnoid hemorrhage, unspecified: Secondary | ICD-10-CM | POA: Diagnosis not present

## 2022-09-28 DIAGNOSIS — Z993 Dependence on wheelchair: Secondary | ICD-10-CM

## 2022-09-28 DIAGNOSIS — Z515 Encounter for palliative care: Secondary | ICD-10-CM | POA: Diagnosis not present

## 2022-09-28 DIAGNOSIS — E785 Hyperlipidemia, unspecified: Secondary | ICD-10-CM | POA: Diagnosis present

## 2022-09-28 DIAGNOSIS — I6381 Other cerebral infarction due to occlusion or stenosis of small artery: Secondary | ICD-10-CM | POA: Diagnosis not present

## 2022-09-28 DIAGNOSIS — I639 Cerebral infarction, unspecified: Secondary | ICD-10-CM

## 2022-09-28 DIAGNOSIS — Z823 Family history of stroke: Secondary | ICD-10-CM

## 2022-09-28 DIAGNOSIS — I491 Atrial premature depolarization: Secondary | ICD-10-CM | POA: Diagnosis not present

## 2022-09-28 LAB — ECHOCARDIOGRAM COMPLETE
Area-P 1/2: 5.62 cm2
Height: 66 in
MV VTI: 0.87 cm2
S' Lateral: 2.4 cm
Single Plane A4C EF: 55.6 %
Weight: 1890.66 oz

## 2022-09-28 LAB — DIFFERENTIAL
Abs Immature Granulocytes: 0.01 10*3/uL (ref 0.00–0.07)
Basophils Absolute: 0 10*3/uL (ref 0.0–0.1)
Basophils Relative: 0 %
Eosinophils Absolute: 0.1 10*3/uL (ref 0.0–0.5)
Eosinophils Relative: 1 %
Immature Granulocytes: 0 %
Lymphocytes Relative: 22 %
Lymphs Abs: 1.2 10*3/uL (ref 0.7–4.0)
Monocytes Absolute: 0.3 10*3/uL (ref 0.1–1.0)
Monocytes Relative: 5 %
Neutro Abs: 3.9 10*3/uL (ref 1.7–7.7)
Neutrophils Relative %: 72 %

## 2022-09-28 LAB — APTT: aPTT: 45 seconds — ABNORMAL HIGH (ref 24–36)

## 2022-09-28 LAB — COMPREHENSIVE METABOLIC PANEL
ALT: 12 U/L (ref 0–44)
AST: 16 U/L (ref 15–41)
Albumin: 3.1 g/dL — ABNORMAL LOW (ref 3.5–5.0)
Alkaline Phosphatase: 78 U/L (ref 38–126)
Anion gap: 9 (ref 5–15)
BUN: 21 mg/dL (ref 8–23)
CO2: 27 mmol/L (ref 22–32)
Calcium: 9 mg/dL (ref 8.9–10.3)
Chloride: 101 mmol/L (ref 98–111)
Creatinine, Ser: 0.79 mg/dL (ref 0.44–1.00)
GFR, Estimated: >60 mL/min (ref >60)
Glucose, Bld: 146 mg/dL — ABNORMAL HIGH (ref 70–99)
Potassium: 3.8 mmol/L (ref 3.5–5.1)
Sodium: 137 mmol/L (ref 135–145)
Total Bilirubin: 0.2 mg/dL — ABNORMAL LOW (ref 0.3–1.2)
Total Protein: 5.7 g/dL — ABNORMAL LOW (ref 6.5–8.1)

## 2022-09-28 LAB — CBC
HCT: 35.6 % — ABNORMAL LOW (ref 36.0–46.0)
Hemoglobin: 11.6 g/dL — ABNORMAL LOW (ref 12.0–15.0)
MCH: 27 pg (ref 26.0–34.0)
MCHC: 32.6 g/dL (ref 30.0–36.0)
MCV: 83 fL (ref 80.0–100.0)
Platelets: 172 10*3/uL (ref 150–400)
RBC: 4.29 MIL/uL (ref 3.87–5.11)
RDW: 15.4 % (ref 11.5–15.5)
WBC: 5.4 10*3/uL (ref 4.0–10.5)
nRBC: 0 % (ref 0.0–0.2)

## 2022-09-28 LAB — I-STAT CHEM 8, ED
BUN: 25 mg/dL — ABNORMAL HIGH (ref 8–23)
Calcium, Ion: 1.05 mmol/L — ABNORMAL LOW (ref 1.15–1.40)
Chloride: 102 mmol/L (ref 98–111)
Creatinine, Ser: 0.7 mg/dL (ref 0.44–1.00)
Glucose, Bld: 157 mg/dL — ABNORMAL HIGH (ref 70–99)
HCT: 38 % (ref 36.0–46.0)
Hemoglobin: 12.9 g/dL (ref 12.0–15.0)
Potassium: 4.1 mmol/L (ref 3.5–5.1)
Sodium: 139 mmol/L (ref 135–145)
TCO2: 27 mmol/L (ref 22–32)

## 2022-09-28 LAB — ETHANOL: Alcohol, Ethyl (B): <10 mg/dL (ref <10)

## 2022-09-28 LAB — PROTIME-INR
INR: 4.3 (ref 0.8–1.2)
Prothrombin Time: 40.7 seconds — ABNORMAL HIGH (ref 11.4–15.2)

## 2022-09-28 LAB — CBG MONITORING, ED: Glucose-Capillary: 147 mg/dL — ABNORMAL HIGH (ref 70–99)

## 2022-09-28 MED ORDER — IOHEXOL 350 MG/ML SOLN
75.0000 mL | Freq: Once | INTRAVENOUS | Status: AC | PRN
  Administered 2022-09-28: 75 mL via INTRAVENOUS

## 2022-09-28 MED ORDER — ASPIRIN 300 MG RE SUPP
300.0000 mg | Freq: Every day | RECTAL | Status: DC
  Administered 2022-09-28 – 2022-09-29 (×2): 300 mg via RECTAL
  Filled 2022-09-28: qty 1

## 2022-09-28 MED ORDER — ACETAMINOPHEN 325 MG PO TABS
650.0000 mg | ORAL_TABLET | ORAL | Status: DC | PRN
  Administered 2022-10-06: 650 mg via ORAL
  Filled 2022-09-28: qty 2

## 2022-09-28 MED ORDER — ASPIRIN 325 MG PO TABS
325.0000 mg | ORAL_TABLET | Freq: Every day | ORAL | Status: DC
  Filled 2022-09-28: qty 1

## 2022-09-28 MED ORDER — STROKE: EARLY STAGES OF RECOVERY BOOK: Freq: Once | Status: DC

## 2022-09-28 MED ORDER — BISACODYL 10 MG RE SUPP: 10.0000 mg | Freq: Every day | RECTAL | Status: DC | PRN

## 2022-09-28 MED ORDER — SODIUM CHLORIDE 0.9 % IV SOLN: INTRAVENOUS | Status: DC

## 2022-09-28 MED ORDER — SODIUM CHLORIDE 0.9% FLUSH: 3.0000 mL | Freq: Once | INTRAVENOUS | Status: DC

## 2022-09-28 MED ORDER — ACETAMINOPHEN 650 MG RE SUPP
650.0000 mg | RECTAL | Status: DC | PRN
  Filled 2022-09-28: qty 1

## 2022-09-28 MED ORDER — ATORVASTATIN CALCIUM 40 MG PO TABS
40.0000 mg | ORAL_TABLET | Freq: Every day | ORAL | Status: DC
  Administered 2022-09-30 – 2022-10-01 (×2): 40 mg via ORAL
  Filled 2022-09-28 (×2): qty 1

## 2022-09-28 MED ORDER — ACETAMINOPHEN 160 MG/5ML PO SOLN
650.0000 mg | ORAL | Status: DC | PRN
  Administered 2022-10-05 – 2022-10-06 (×3): 650 mg
  Filled 2022-09-28 (×3): qty 20.3

## 2022-09-28 MED ORDER — WARFARIN - PHARMACIST DOSING INPATIENT: Freq: Every day | Status: DC

## 2022-09-28 NOTE — Code Documentation (Signed)
Stroke Response Nurse Documentation Code Documentation  [name] is a 76 y.o. female arriving to [facility]  via [facility] on 09/09/2022 with past medical hx of prediabetes, history of mitral valve replacement 1997 maintained on Coumadin, hyperlipidemia hypertension brain surgery with craniotomy right hemispheric CVA with residual left hemiparesis received inpatient rehab services 10/20 as well as subcortical CVA receiving inpatient rehab services 8/21 . On warfarin daily. Code stroke was activated by EMS.   Patient from [facility] where she was LKW at 0830 eating breakfast and now complaining of forced left gaze, right sided weakness/sensory deficits. Pt was at her baseline eating breakfast at her facility. Staff came into her room to give her morning medications when they noticed her staring to the left and less responsive. EMS was called. BP 120/80 and CBG WNL with EMS.    Stroke team at the bedside on patient arrival. Labs drawn and patient cleared for CT by Dr. Gilford Raid. Patient to CT with team. NIHSS 25, see documentation for details and code stroke times. Patient with disoriented, left gaze preference , right hemianopia, left facial droop, right arm weakness, bilateral leg weakness, right decreased sensation, Expressive aphasia , dysarthria , and Visual  neglect on exam. The following imaging was completed:  CT Head, CTA, and CTP. Patient is not a candidate for IV Thrombolytic due to hx SAH, multiple strokes. Patient is not a candidate for IR due to no LVO per MD.   Care Plan: Q2x12 mNIHSS/vitals, EEG, MRI.   Bedside handoff with ED RN Venetia Night .    Juan Olthoff, Rande Brunt  Stroke Response RN

## 2022-09-28 NOTE — H&P (Addendum)
History and Physical    Patient: [name]:[id number] DOB: 11/11/1945 DOA: 09/24/2022 DOS: the patient was seen and examined on 09/25/2022 PCP: Tisovec, [name], MD  Patient coming from: SNF - [facility], husband lives in [place] nearby; NOK: [place], [place], [phone]   Chief Complaint: Stroke-like symptoms  HPI: [name] is a 76 y.o. 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  Her husband reports that the nurse called and she was drooling and they were concerned about another stroke. At baseline she is able to talk, feeds herself in a wheelchair in her room, non-ambulatory but working with PT to try to ambulate.  Normal conversation, no dysphagia.  She has had 6 strokes.     ER Course:  CVA, on Coumadin.  EEG ordered, neuro will see.     Review of Systems: unable to review all systems due to the inability of the patient to answer questions. Past Medical History:  Diagnosis Date   Abnormality of gait 09/07/2016   Allergy    Diabetes mellitus without complication [facility])    Patient denies this - notes history of glucose intolerance   GERD (gastroesophageal reflux disease)    Hemiparesis and alteration of sensations as late effects of stroke ([place]) 09/07/2016   History of pneumonia 1997   Hypertension    S/P MVR (mitral valve replacement)    Mechanical mitral valve replacement at age 62 (done in [place])  // echo 7/17: EF 55-60, normal wall motion, bileaflet mechanical mitral valve prosthesis functioning normally, mild LAE, mildly reduced RVSF, small pericardial effusion   Stroke ([place]) 1997, 2013, 2015   Past Surgical History:  Procedure Laterality Date   [place]   Social History:  reports that she has never smoked. She has never been  exposed to tobacco smoke. She has never used smokeless tobacco. She reports that she does not drink alcohol and does not use drugs.  Allergies  Allergen Reactions   Zoloft [Sertraline] Hives, Itching and Rash    Family History  Problem Relation Age of Onset   Cancer Mother        Bone   Heart disease Mother    Hyperlipidemia Mother    Hypertension Mother    Stroke Father    Hypertension Father    Heart attack Neg Hx     Prior to Admission medications   Medication Sig Start Date End Date Taking? Authorizing Provider  acetaminophen (TYLENOL) 325 MG tablet Take 1-2 tablets (325-650 mg total) by mouth every 4 (four) hours as needed for mild pain. Patient taking differently: Take 650 mg by mouth every 6 (six) hours as needed for mild pain. 05/26/22   Angiulli, Lavon Paganini, PA-C  Amino Acids-Protein Hydrolys (FEEDING SUPPLEMENT, PRO-STAT SUGAR FREE 64,) LIQD Take 30 mLs by mouth daily. Patient not taking: Reported on 09/24/2022    [provider]  amLODipine (NORVASC) 5 MG tablet Take 1 tablet (5 mg total) by mouth in the morning. 08/30/22   Hongalgi, Lenis Dickinson, MD  atorvastatin (LIPITOR) 40 MG tablet Take 1 tablet (40 mg total) by mouth daily. 05/26/22   Angiulli, Lavon Paganini, PA-C  bisacodyl (DULCOLAX) 10 MG suppository Place 1 suppository (10 mg total) rectally daily as needed for moderate constipation or  mild constipation. Patient not taking: Reported on 09/24/2022 08/30/22   Modena Jansky, MD  Calcium Carbonate-Vit D-Min (CALCIUM 1200) 1200-1000 MG-UNIT CHEW Chew 1 tablet by mouth in the morning.    [provider]  Cholecalciferol (VITAMIN D-3) 25 MCG (1000 UT) CAPS Take 1,000 Units by mouth in the morning.    [provider]  denosumab (PROLIA) 60 MG/ML SOSY injection Inject 60 mg into the skin every 6 (six) months.    [provider]  Menthol, Topical Analgesic, (BIOFREEZE) 4 % GEL Apply 1 application  topically every 4 (four) hours as needed (mild  pain). To back. Patient not taking: Reported on 09/24/2022    [provider]  methocarbamol (ROBAXIN) 500 MG tablet Take 1 tablet (500 mg total) by mouth 3 (three) times daily. 05/26/22   Angiulli, Lavon Paganini, PA-C  omeprazole (PRILOSEC OTC) 20 MG tablet Take 20 mg by mouth in the morning.    [provider]  ondansetron (ZOFRAN) 4 MG tablet Take 4 mg by mouth every 8 (eight) hours as needed for nausea or vomiting.    [provider]  PARoxetine (PAXIL) 10 MG tablet Take 10 mg by mouth in the morning.    [provider]  polyethylene glycol (MIRALAX / GLYCOLAX) 17 g packet Take 17 g by mouth 2 (two) times daily. 08/30/22   Hongalgi, Lenis Dickinson, MD  senna-docusate (SENOKOT-S) 8.6-50 MG tablet Take 1 tablet by mouth 2 (two) times daily. 08/30/22   Hongalgi, Lenis Dickinson, MD  warfarin (COUMADIN) 10 MG tablet Take 10 mg by mouth every other day.    [provider]  warfarin (COUMADIN) 5 MG tablet Take 5 mg by mouth every other day.    [provider]    Physical Exam: Vitals:   09/21/2022 1145 09/17/2022 1200 09/10/2022 1215 09/17/2022 1230  BP: 115/78 113/83 (!) 131/100 117/84  Pulse: (!) 101 (!) 107 99 (!) 110  Resp: 18 (!) '22 19 19  '$ Temp:      TempSrc:      SpO2: 98% 96% 100% 98%  Weight:      Height:       General:  Appears seriously ill, L-ward gaze, marked facial droop, minimally conversant with dysarthria Eyes:  EOMI, normal lids, iris ENT:  grossly normal hearing, lips & tongue, mmm Neck:  no LAD, masses or thyromegaly Cardiovascular:  RR with mild tachycardia, no m/r/g. No LE edema.  Respiratory:   CTA bilaterally with no wheezes/rales/rhonchi.  Normal respiratory effort. Abdomen:  soft, NT, ND Skin:  no rash or induration seen on limited exam Musculoskeletal:  RUE hemiplegia/flaccid; RLE with marked weakness but she is able to move her toes Lower extremity:  No LE edema.  Limited foot exam with no ulcerations.  2+ distal pulses. Psychiatric:  flat mood and affect, speech sparse and quite dysarthric Neurologic:  R facial weakness   Radiological Exams on Admission: Independently reviewed - see discussion in A/P where applicable  EEG adult  Result Date: 09/11/2022 Lora Havens, MD     09/19/2022 11:23 AM Patient Name: NATAKI MCCRUMB MRN: [id number] Epilepsy Attending: Lora Havens Referring Physician/Provider: Greta Doom, MD Date: 09/13/2022 Duration: 22.05 mins Patient history: 76 year old female presented with forced left gaze, right sided weakness/sensory deficits.  EEG to evaluate for seizure. Level of alertness:  lethargic AEDs during EEG study: None Technical aspects: This EEG study was done with scalp electrodes positioned according to the 10-20 International system of electrode placement.  Electrical activity was reviewed with band pass filter of 1-'70Hz'$ , sensitivity of 7 uV/mm, display speed of 743m/sec with a '60Hz'$  notched filter applied as appropriate. EEG data were recorded continuously and digitally stored.  Video monitoring was available and reviewed as appropriate. Description: EEG showed continuous low amplitude 2 to 3 Hz delta slowing in left hemisphere.  Additionally there was 5 to 8 Hz theta-alpha activity admixed with 12 to 15 Hz beta activity in right hemisphere.  Hyperventilation and photic stimulation were not performed.   ABNORMALITY -Continuous slow, generalized and lateralized left hemisphere IMPRESSION: This study is suggestive of cortical dysfunction in left hemisphere, likely secondary to underlying structural abnormality, postictal state.  Additionally there was moderate diffuse encephalopathy, nonspecific etiology.  No seizures or epileptiform discharges were seen throughout the recording. Priyanka [name]  CT ANGIO HEAD NECK W WO CM W PERF (CODE STROKE)  Result Date: 09/15/2022 CLINICAL DATA:  76year old female code stroke presentation with leftward gaze, aphasia. EXAM: CT ANGIOGRAPHY HEAD  AND NECK CT PERFUSION BRAIN TECHNIQUE: Multidetector CT imaging of the head and neck was performed using the standard protocol during bolus administration of intravenous contrast. Multiplanar CT image reconstructions and MIPs were obtained to evaluate the vascular anatomy. Carotid stenosis measurements (when applicable) are obtained utilizing NASCET criteria, using the distal internal carotid diameter as the denominator. Multiphase CT imaging of the brain was performed following IV bolus contrast injection. Subsequent parametric perfusion maps were calculated using RAPID software. RADIATION DOSE REDUCTION: This exam was performed according to the departmental dose-optimization program which includes automated exposure control, adjustment of the mA and/or kV according to patient size and/or use of iterative reconstruction technique. CONTRAST:  764mOMNIPAQUE IOHEXOL 350 MG/ML SOLN COMPARISON:  Plain head CT 0932 hours today. Previous CTA head and neck 04/26/2022 and earlier. FINDINGS: CT Brain Perfusion Findings: ASPECTS: 10 (chronic encephalomalacia) CBF (<30%) Volume: 43m61merfusion (Tmax>6.0s) volume: 43mL24msmatch Volume: Not applicable Infarction Location:None detected CTA NECK Skeleton: Cervical spine degeneration appears stable. Chronic right vertex craniotomy. No acute osseous abnormality identified. Upper chest: Stable, negative. Other neck: Stable, negative. Aortic arch: Stable mild tortuosity of the aortic arch. Three vessel arch configuration. Mild for age arch atherosclerosis. Right carotid system: Minimal brachiocephalic artery plaque without stenosis. Mildly tortuous right CCA with no plaque or stenosis. No significant cervical right ICA plaque or stenosis. Left carotid system: Left CCA origin remains normal. Minimal left CCA plaque without stenosis before the bifurcation. Mild mostly calcified plaque at the left carotid bifurcation. No cervical left ICA stenosis. Vertebral arteries: Minimal plaque in  the proximal right subclavian artery. Normal right vertebral artery origin. Patent right vertebral to the skull base with no plaque or stenosis. Stable mild mostly calcified plaque proximal left subclavian artery without stenosis. Normal left vertebral artery origin. Patent mildly dominant left vertebral artery to the skull base with no plaque or stenosis. CTA HEAD Posterior circulation: Stable distal vertebral arteries without plaque or stenosis. Left V4 segment is dominant. Right PICA origin remains patent. However, the Left PICA origin appears to be occluded now, was normal in June. Small caliber left AICA appears to remain patent. There is some reconstituted left PICA flow. Vertebrobasilar junction and basilar artery appear stable, negative. SCA and right PCA origin remain normal. Fetal type left PCA origin redemonstrated with small tortuous left P1. Bilateral PCA branches are stable and within normal limits. Anterior circulation: Both ICA siphons are patent. No siphon plaque or stenosis. Normal ophthalmic and left posterior communicating artery origins.  Patent carotid termini, MCA and ACA origins. Tortuous and mildly dominant right A1 segment redemonstrated. Normal anterior communicating artery. Bilateral ACA branches are stable and within normal limits. Right MCA M1 segment, bifurcation, and right MCA branches are stable and within normal limits. Left MCA M1 segment, bifurcation, and left MCA branches are stable and within normal limits. Venous sinuses: Not well evaluated due to early contrast timing. Anatomic variants: Fetal left PCA origin. Mildly dominant left vertebral artery and right ACA A1 segments. Review of the MIP images confirms the above findings IMPRESSION: 1. 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 Faint left PICA reconstituted enhancement. 2. But otherwise stable and largely negative for age CTA Head and Neck; mild for age atherosclerosis and no other arterial  stenosis or occlusion identified. 3. CT Perfusion detects no infarct core or ischemic penumbra. Salient findings were communicated to Dr. Leonel Ramsay at 10:04 am on 09/20/2022 by text page via the [facility] messaging system. Electronically Signed   By: Genevie Ann M.D.   On: 09/29/2022 10:09   CT HEAD CODE STROKE WO CONTRAST  Result Date: 10/06/2022 CLINICAL DATA:  Code stroke. 76 year old female with aphasia and left gaze. EXAM: CT HEAD WITHOUT CONTRAST TECHNIQUE: Contiguous axial images were obtained from the base of the skull through the vertex without intravenous contrast. RADIATION DOSE REDUCTION: This exam was performed according to the departmental dose-optimization program which includes automated exposure control, adjustment of the mA and/or kV according to patient size and/or use of iterative reconstruction technique. COMPARISON:  Brain MRI 12/28/2021.  Head CT 08/27/2022. FINDINGS: Brain: Chronic posterior frontal, right parietal, and lesser temporal/occipital junction encephalomalacia appears stable with overlying craniotomy. No midline shift, mass effect, or evidence of intracranial mass lesion. No acute intracranial hemorrhage identified. 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 No cortically based acute infarct identified. Vascular: Mild Calcified atherosclerosis at the skull base. No suspicious intracranial vascular hyperdensity. Skull: Previous right posterior craniotomy is stable. No acute osseous abnormality identified. Sinuses/Orbits: Visualized paranasal sinuses and mastoids are stable and well aerated. Other: Leftward gaze is new. Chronic postoperative changes to the scalp. ASPECTS [facility] Stroke Program Early CT Score) Total score (0-10 with 10 being normal): 10 IMPRESSION: 1. No acute cortically based infarct or acute intracranial  hemorrhage identified. ASPECTS 10. 2. Stable non contrast CT appearance of the brain, with chronic small vessel ischemia and chronic right hemisphere encephalomalacia, craniotomy. 3. These results were communicated to Dr. Leonel Ramsay at 9:41 am on 09/08/2022 by text page via the [facility] messaging system. Electronically Signed   By: Genevie Ann M.D.   On: 10/03/2022 09:41    EKG: Independently reviewed.  Sinus tachycardia with rate 114; prolonged QTc 533; nonspecific ST changes with no evidence of acute ischemia   Labs on Admission: I have personally reviewed the available labs and imaging studies at the time of the admission.  Pertinent labs:    Glucose 146 Albumin 3.1 Unremarkable CBC INR 4.3   Assessment and Plan: Principal Problem:   Acute CVA (cerebrovascular accident) ([place]) Active Problems:   H/O mitral valve replacement with mechanical valve   HTN (hypertension)   Diabetes mellitus without complication ([place])   HLD (hyperlipidemia)   DNR (do not resuscitate)    Acute CVA -Patient with prior h/o multiple CVAs presenting with apparent new CVA; she has marked R-sided deficits with concern  about swallow function, aphasia -ABCD2 score is 7 -tPA cannot be given since she is on Coumadin -Aspirin has been given to reduce stroke mortality and decrease morbidity -Will admit for further CVA evaluation -Telemetry monitoring -MRI -Echo -Neurology consult -PT/OT/ST/Nutrition Consults -CIR consult -Concern for swallow dysfunction; NPO until ST has the chance to evaluate -Palliative care consulted  HTN -Allow permissive HTN for now -Treat BP only if >220/120, and then with goal of 15% reduction -Hold amlodipine and plan to restart in 48-72 hours  HLD -Check FLP -Resume Lipitor 40 mg daily when patient is able to pass swallow evaluation   DM -Last A1c was 6.1, not on home medications -She does not appear to need treatment at this time  H/o Mitral valve replacement -Coumadin with  dosing per pharmacy -INR is currently supratherapeutic but there is no evidence of bleeding at this time  DNR  -I have discussed code status with the patient's husband and she would prefer to die a natural death should that situation arise. -She has a gold out of facility DNR form at the bedside      Advance Care Planning:   Code Status: DNR   Consults: Neurology, Palliative care, CIR; PT/OT/ST/Nutrition; TOC team  DVT Prophylaxis: Coumadin  Family Communication: Husband was present throughout evaluation  Severity of Illness: The appropriate patient status for this patient is INPATIENT. Inpatient status is judged to be reasonable and necessary in order to provide the required intensity of service to ensure the patient's safety. The patient's presenting symptoms, physical exam findings, and initial radiographic and laboratory data in the context of their chronic comorbidities is felt to place them at high risk for further clinical deterioration. Furthermore, it is not anticipated that the patient will be medically stable for discharge from the hospital within 2 midnights of admission.   * I certify that at the point of admission it is my clinical judgment that the patient will require inpatient hospital care spanning beyond 2 midnights from the point of admission due to high intensity of service, high risk for further deterioration and high frequency of surveillance required.*  Author: Karmen Bongo, MD 09/27/2022 1:09 PM  For on call review [email or web address].

## 2022-09-28 NOTE — ED Notes (Signed)
Meds  q4 neuro checks

## 2022-09-28 NOTE — Progress Notes (Signed)
[facility]) Hospital Liaison note:  This patient is currently enrolled in [facility] outpatient-based Palliative Care. Will continue to follow for disposition.  Please call with any outpatient palliative questions or concerns.  Thank you, Lorelee Market, LPN [facility] Liaison [phone]

## 2022-09-28 NOTE — Progress Notes (Signed)
ANTICOAGULATION CONSULT NOTE - Initial Consult  Pharmacy Consult for warfarin Indication: MVR  Allergies  Allergen Reactions   Zoloft [Sertraline] Hives, Itching and Rash    Patient Measurements: Height: '5\' 6"'$  (167.6 cm) Weight: 53.6 kg (118 lb 2.7 oz) IBW/kg (Calculated) : 59.3  Vital Signs: Temp: 98.4 F (36.9 C) (11/21 1000) Temp Source: Axillary (11/21 1000) BP: 104/78 (11/21 1100) Pulse Rate: 99 (11/21 1100)  Labs: Recent Labs    09/11/2022 0926 09/14/2022 0931  HGB 11.6* 12.9  HCT 35.6* 38.0  PLT 172  --   APTT 45*  --   LABPROT 40.7*  --   INR 4.3*  --   CREATININE 0.79 0.70    Estimated Creatinine Clearance: 50.6 mL/min (by C-G formula based on SCr of 0.7 mg/dL).   Medical History: Past Medical History:  Diagnosis Date   Abnormality of gait 09/07/2016   Allergy    Diabetes mellitus without complication [facility])    Patient denies this - notes history of glucose intolerance   GERD (gastroesophageal reflux disease)    Hemiparesis and alteration of sensations as late effects of stroke ([place]) 09/07/2016   History of pneumonia 1997   Hypertension    S/P MVR (mitral valve replacement)    Mechanical mitral valve replacement at age 76 (done in [place])  // echo 7/17: EF 55-60, normal wall motion, bileaflet mechanical mitral valve prosthesis functioning normally, mild LAE, mildly reduced RVSF, small pericardial effusion   Stroke ([place]) 1997, 2013, 2015    Assessment: 76 YOF presenting with stoke sx, hx of MVR on warfarin PTA with last dose 11/20.  INR on admission supratherapeutic at 4.3  PTA dosing: Alternating '10mg'$  with '5mg'$   Goal of Therapy:  INR 2.5-3.5 Monitor platelets by anticoagulation protocol: Yes   Plan:  Hold todays warfarin dose Daily INR, s/s bleeding  Bertis Ruddy, PharmD Clinical Pharmacist ED Pharmacist Phone # [phone] 09/11/2022 1:03 PM

## 2022-09-28 NOTE — ED Notes (Signed)
Dr. Gilford Raid notified of elevated PT-INR

## 2022-09-28 NOTE — Progress Notes (Signed)
CSW spoke with [place] at [facility] who states the patient is a long term care resident and can return at discharge.  Madilyn Fireman, MSW, LCSW Transitions of Care  Clinical Social Worker II [phone]

## 2022-09-28 NOTE — Consult Note (Signed)
Neurology Consultation Reason for Consult: Right-sided weakness Referring Physician: Aileen Pilot  CC: Right-sided weakness  History is obtained from: Patient  HPI: [name] is a 76 y.o. female with a history of diabetes, hypertension, mitral valve replacement on Coumadin who presents with right-sided weakness that started abruptly this morning.  She was in her normal state of health at 8:30 AM, but then subsequently staff checked on her to take her her medicine, and found her to be "staring at the wall."  It was noted that she had a gaze deviation and right-sided weakness and therefore code stroke was activated.   LKW: 8:30 AM tnk given?: no, elevated INR Premorbid modified rankin scale: Four    Past Medical History:  Diagnosis Date   Abnormality of gait 09/07/2016   Allergy    Diabetes mellitus without complication [facility])    Patient denies this - notes history of glucose intolerance   GERD (gastroesophageal reflux disease)    Hemiparesis and alteration of sensations as late effects of stroke ([place]) 09/07/2016   History of pneumonia 1997   Hypertension    S/P MVR (mitral valve replacement)    Mechanical mitral valve replacement at age 26 (done in [place])  // echo 7/17: EF 55-60, normal wall motion, bileaflet mechanical mitral valve prosthesis functioning normally, mild LAE, mildly reduced RVSF, small pericardial effusion   Stroke ([place]) 1997, 2013, 2015     Family History  Problem Relation Age of Onset   Cancer Mother        Bone   Heart disease Mother    Hyperlipidemia Mother    Hypertension Mother    Stroke Father    Hypertension Father    Heart attack Neg Hx      Social History:  reports that she has never smoked. She has never been exposed to tobacco smoke. She has never used smokeless tobacco. She reports that she does not drink alcohol and does not use drugs.   Exam: Current vital signs: BP 104/78   Pulse 99   Temp 98.4 F (36.9 C) (Axillary)   Resp 16    Ht '5\' 6"'$  (1.676 m)   Wt 53.6 kg   SpO2 100%   BMI 19.07 kg/m  Vital signs in last 24 hours: Temp:  [98.4 F (36.9 C)] 98.4 F (36.9 C) (11/21 1000) Pulse Rate:  [99-116] 99 (11/21 1100) Resp:  [12-18] 16 (11/21 1100) BP: (104-132)/(78-84) 104/78 (11/21 1100) SpO2:  [98 %-100 %] 100 % (11/21 1100) Weight:  [53.6 kg] 53.6 kg (11/21 0900)   Physical Exam  Constitutional: Appears well-developed and well-nourished.  Psych: Affect appropriate to situation Eyes: No scleral injection HENT: No OP obstruction MSK: no joint deformities.  Cardiovascular: Normal rate and regular rhythm.  Respiratory: Effort normal, non-labored breathing GI: Soft.  No distension. There is no tenderness.  Skin: WDI  Neuro: Mental Status: Patient is awake, alert, oriented to person, month, age.  She is aphonic, but does mouth words and appears to be able to correctly name Crani.  Al Nerves: II: She blinks to threat from the left but not right. Pupils are equal, round, and reactive to light.   III,IV, VI: She has a left gaze forced deviation She has right facial weakness Motor: She is flaccid on the right with triple flexion in the right lower extremity sensory: She does grimace to noxious stimulation bilaterally  Plantar reflex: Upgoing on the right Cerebellar: She would not reliably perform     I have reviewed  labs in epic and the results pertinent to this consultation are: INR 4.3  I have reviewed the images obtained: CT head-negative, CTA-PICA occlusion of unclear chronicity  Impression: 76 year old female with acute onset right hemiplegia with left gaze and relatively preserved language.  I do wonder about thalamic stroke given field cut, hemiparesis, gaze palsy.  PPRF involvement would also be a possibility.  Given concern for LVO with negative CTP, stat EEG was obtained which demonstrates no ongoing seizure activity as etiology.  Strongly suspect stroke at this  point.  Recommendations: - HgbA1c, fasting lipid panel - MRI of the brain without contrast - Frequent neuro checks - Echocardiogram - Prophylactic therapy-hold Coumadin given elevated INR - Risk factor modification - Telemetry monitoring - PT consult, OT consult, Speech consult - Stroke team to follow  Roland Rack, MD [facility] [phone]  If 7pm- 7am, please page neurology on call as listed in [place].

## 2022-09-28 NOTE — Progress Notes (Signed)
  Echocardiogram 2D Echocardiogram has been performed.  [name] 10/05/2022, 4:12 PM

## 2022-09-28 NOTE — ED Notes (Signed)
Speech at bedside

## 2022-09-28 NOTE — ED Notes (Signed)
Patient transported to MRI 

## 2022-09-28 NOTE — Progress Notes (Signed)
EEG complete - results pending 

## 2022-09-28 NOTE — Procedures (Signed)
Patient Name: [name]  MRN: [id number]  Epilepsy Attending: Lora Havens  Referring Physician/Provider: Greta Doom, MD  Date: 09/17/2022  Duration: 22.05 mins  Patient history: 76 year old female presented with forced left gaze, right sided weakness/sensory deficits.  EEG to evaluate for seizure.  Level of alertness:  lethargic   AEDs during EEG study: None  Technical aspects: This EEG study was done with scalp electrodes positioned according to the 10-20 International system of electrode placement. Electrical activity was reviewed with band pass filter of 1-'70Hz'$ , sensitivity of 7 uV/mm, display speed of 9m/sec with a '60Hz'$  notched filter applied as appropriate. EEG data were recorded continuously and digitally stored.  Video monitoring was available and reviewed as appropriate.  Description: EEG showed continuous low amplitude 2 to 3 Hz delta slowing in left hemisphere.  Additionally there was 5 to 8 Hz theta-alpha activity admixed with 12 to 15 Hz beta activity in right hemisphere.  Hyperventilation and photic stimulation were not performed.     ABNORMALITY -Continuous slow, generalized and lateralized left hemisphere  IMPRESSION: This study is suggestive of cortical dysfunction in left hemisphere, likely secondary to underlying structural abnormality, postictal state.  Additionally there was moderate diffuse encephalopathy, nonspecific etiology.  No seizures or epileptiform discharges were seen throughout the recording.  Adelynn Gipe [name]

## 2022-09-28 NOTE — ED Triage Notes (Signed)
Pt bib ems from [facility]; per staff, LSN 0830 while eating breakfast; staff went to bring pt am meds, found pt with L sided gaze, R sided weakness; hx stroke, some R sided deficits at baseline, but increased today; pt answering yes/no questions; alert, following commands; cbg 176, bp 120/80; pt on coumadin

## 2022-09-28 NOTE — Evaluation (Signed)
Clinical/Bedside Swallow Evaluation Patient Details  Name: [name] MRN: [id number] Date of Birth: [birth year]  Today's Date: 09/30/2022 Time: SLP Start Time (ACUTE ONLY): 1400 SLP Stop Time (ACUTE ONLY): 1410 SLP Time Calculation (min) (ACUTE ONLY): 10 min  Past Medical History:  Past Medical History:  Diagnosis Date   Abnormality of gait 09/07/2016   Allergy    Diabetes mellitus without complication ([place])    Patient denies this - notes history of glucose intolerance   GERD (gastroesophageal reflux disease)    Hemiparesis and alteration of sensations as late effects of stroke ([place]) 09/07/2016   History of pneumonia 1997   Hypertension    S/P MVR (mitral valve replacement)    Mechanical mitral valve replacement at age 58 (done in [place])  // echo 7/17: EF 55-60, normal wall motion, bileaflet mechanical mitral valve prosthesis functioning normally, mild LAE, mildly reduced RVSF, small pericardial effusion   Stroke ([place]) 1997, 2013, 2015   Past Surgical History:  Past Surgical History:  Procedure Laterality Date   ABDOMINAL HYSTERECTOMY     BRAIN SURGERY     BUNIONECTOMY  1993   CARDIAC VALVE REPLACEMENT  1997   TOE SURGERY  [id number]   HPI:  76 year old female with acute onset right hemiplegia with left gaze and relatively preserved language.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    Assessment / Plan / Recommendation  Clinical Impression  Pt demosntrates significant oral dysphagia with right facial weakness and suspected hypoglossal weakness. Pt given one ice chip with slow, weak oral manipulation and prolonged interval of time between presentation and swallow. Small taste of water  to lips resulted in almost absent ability to orally contain bolus and immediate prolonged cough response with subjectively decreased force of cough. Recommend pt remain NPO with alternate source of nutrition at ths time,  reported to MD. SLP will f/u for further trials and interventions as pts condition stabilizes. SLP Visit Diagnosis: Dysphagia, oropharyngeal phase (R13.12)    Aspiration Risk  Severe aspiration risk    Diet Recommendation NPO;Alternative means - temporary   Medication Administration: Via alternative means    Other  Recommendations Oral Care Recommendations: Oral care QID Other Recommendations: Have oral suction available    Recommendations for follow up therapy are one component of a multi-disciplinary discharge planning process, led by the attending physician.  Recommendations may be updated based on patient status, additional functional criteria and insurance authorization.  Follow up Recommendations Skilled nursing-short term rehab (<3 hours/day)      Assistance Recommended at Discharge    Functional Status Assessment Patient has had a recent decline in their functional status and demonstrates the ability to make significant improvements in function in a reasonable and predictable amount of time.  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 Type of Study: Bedside Swallow Evaluation Diet Prior to this Study: NPO Temperature Spikes Noted: No Respiratory Status: Room air History of Recent Intubation: No Behavior/Cognition: Alert Oral Cavity Assessment: Dry Oral Care Completed by SLP: No Oral Cavity - Dentition: Adequate natural dentition Self-Feeding Abilities:  Total assist Patient Positioning: Upright in bed Baseline Vocal Quality: Low vocal intensity Volitional Cough: Weak Volitional Swallow: Able  to elicit    Oral/Motor/Sensory Function Overall Oral Motor/Sensory Function: Severe impairment Facial ROM: Reduced right;Suspected CN VII (facial) dysfunction Facial Symmetry: Abnormal symmetry right;Suspected CN VII (facial) dysfunction Facial Strength: Reduced right;Suspected CN VII (facial) dysfunction Lingual ROM: Reduced right;Suspected CN XII (hypoglossal) dysfunction Lingual Symmetry: Within Functional Limits Lingual Strength: Reduced   Ice Chips Ice chips: Impaired Oral Phase Functional Implications: Prolonged oral transit Pharyngeal Phase Impairments: Suspected delayed Swallow   Thin Liquid Thin Liquid: Impaired Presentation: Cup Oral Phase Impairments: Reduced lingual movement/coordination;Reduced labial seal Pharyngeal  Phase Impairments: Cough - Immediate    Nectar Thick Nectar Thick Liquid: Not tested   Honey Thick Honey Thick Liquid: Not tested   Puree Puree: Not tested   Solid     Solid: Not tested      Jyllian Haynie, Katherene Ponto 09/20/2022,2:28 PM

## 2022-09-28 NOTE — NC FL2 (Addendum)
[place] LEVEL OF CARE SCREENING TOOL     IDENTIFICATION  Patient Name: [name] Birthdate: 01/17/1946 Sex: female Admission Date (Current Location): 09/08/2022  [facility] and [place] Number:  Herbalist and Address:  [facility], [place] [address], [place], [place] [address]      Provider Number: [id number]  Attending Physician Name and Address:  Karmen Bongo, MD  Relative Name and Phone Number:       Current Level of Care: SNF Recommended Level of Care: [place] Prior Approval Number:    Date Approved/Denied:   PASRR Number: [id number]  Discharge Plan: SNF    Current Diagnoses: Patient Active Problem List   Diagnosis Date Noted   DNR (do not resuscitate) 09/30/2022   AMS (altered mental status) 08/28/2022   Constipation 08/28/2022   Gastritis 08/28/2022   Subarachnoid hemorrhage ([place]) 04/30/2022   SAH (subarachnoid hemorrhage) ([place]) 04/26/2022   Goals of care, counseling/discussion    Palliative care by specialist    Acute CVA (cerebrovascular accident) ([place]) 07/01/2020   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   Anticoagulated on warfarin    Left hemiparesis ([place])    History of CVA with residual deficit    Thrombocytopenia ([place])    Prediabetes    Vertigo 09/24/2017   Benign paroxysmal positional vertigo    History of stroke    HLD (hyperlipidemia) 08/23/2017   Encounter for therapeutic drug monitoring 08/08/2017   Long term (current) use of anticoagulants [Z79.01] 07/12/2017   Hemiparesis and alteration of sensations as late effects of stroke ([place]) 09/07/2016   Abnormality of gait 09/07/2016   HTN (hypertension) 04/13/2016   H/O mitral valve replacement with mechanical valve 10/16/2015   Diabetes mellitus without complication (H[facility])    Hemiparesis (L sided - mild) due to old stroke ([place])     Orientation RESPIRATION BLADDER Height & Weight     Self, Time, Situation, Place  Normal  Continent Weight: 118 lb 2.7 oz (53.6 kg) Height:  '5\' 6"'$  (167.6 cm)  BEHAVIORAL SYMPTOMS/MOOD NEUROLOGICAL BOWEL NUTRITION STATUS      Continent Diet (See discharge summary)  AMBULATORY STATUS COMMUNICATION OF NEEDS Skin   Total Care   Normal                       Personal Care Assistance Level of Assistance  Bathing, Feeding, Dressing Bathing Assistance: Maximum assistance Feeding assistance: Limited assistance Dressing Assistance: Maximum assistance     Functional Limitations Info  Sight, Hearing, Speech Sight Info: Adequate Hearing Info: Adequate Speech Info: Adequate    SPECIAL CARE FACTORS FREQUENCY  PT (By licensed PT), OT (By licensed OT)     PT Frequency: 5x weekly OT Frequency: 5x weekly            Contractures Contractures Info: Not present    Additional Factors Info  Code Status, Allergies Code Status Info: DNR Allergies Info: Zoloft           Current Medications (10/01/2022):  This is the current hospital active medication list Current Facility-Administered Medications  Medication Dose Route Frequency Provider Last Rate Last Admin    stroke: early stages of recovery book   Does not apply Once Karmen Bongo, MD       0.9 %  sodium chloride infusion   Intravenous Continuous Karmen Bongo, MD       acetaminophen (TYLENOL) tablet 650 mg  650 mg Oral Q4H PRN Karmen Bongo, MD  Or   acetaminophen (TYLENOL) 160 MG/5ML solution 650 mg  650 mg Per Tube Q4H PRN Karmen Bongo, MD       Or   acetaminophen (TYLENOL) suppository 650 mg  650 mg Rectal Q4H PRN Karmen Bongo, MD       aspirin suppository 300 mg  300 mg Rectal Daily Karmen Bongo, MD       Or   aspirin tablet 325 mg  325 mg Oral Daily Karmen Bongo, MD       Derrill Memo ON 09/29/2022] atorvastatin (LIPITOR) tablet 40 mg  40 mg Oral Daily Karmen Bongo, MD       bisacodyl (DULCOLAX) suppository 10 mg  10 mg Rectal Daily PRN Karmen Bongo, MD       Warfarin - Pharmacist Dosing  Inpatient   Does not apply q1600 Bertis Ruddy, [facility]       Current Outpatient Medications  Medication Sig Dispense Refill   acetaminophen (TYLENOL) 325 MG tablet Take 1-2 tablets (325-650 mg total) by mouth every 4 (four) hours as needed for mild pain. (Patient taking differently: Take 650 mg by mouth every 6 (six) hours as needed for mild pain.)     amLODipine (NORVASC) 5 MG tablet Take 1 tablet (5 mg total) by mouth in the morning.     atorvastatin (LIPITOR) 40 MG tablet Take 1 tablet (40 mg total) by mouth daily. 30 tablet 0   bisacodyl (DULCOLAX) 10 MG suppository Place 10 mg rectally daily as needed for moderate constipation.     Calcium Carb-Cholecalciferol (CALCIUM + VITAMIN D3) 600-10 MG-MCG TABS Take 2 tablets by mouth daily.     Cholecalciferol (VITAMIN D-3) 25 MCG (1000 UT) CAPS Take 1,000 Units by mouth in the morning.     denosumab (PROLIA) 60 MG/ML SOSY injection Inject 60 mg into the skin every 6 (six) months.     Menthol, Topical Analgesic, (BIOFREEZE) 4 % GEL Apply 1 Application topically every 4 (four) hours as needed (mild pain). Apply to back     methocarbamol (ROBAXIN) 500 MG tablet Take 1 tablet (500 mg total) by mouth 3 (three) times daily. 90 tablet 0   omeprazole (PRILOSEC) 40 MG capsule Take 40 mg by mouth in the morning.     ondansetron (ZOFRAN) 4 MG tablet Take 4 mg by mouth every 6 (six) hours as needed for nausea or vomiting.     PARoxetine (PAXIL) 10 MG tablet Take 10 mg by mouth in the morning.     polyethylene glycol (MIRALAX / GLYCOLAX) 17 g packet Take 17 g by mouth 2 (two) times daily. (Patient taking differently: Take 17 g by mouth every evening.)     senna-docusate (SENOKOT-S) 8.6-50 MG tablet Take 1 tablet by mouth 2 (two) times daily.     warfarin (COUMADIN) 10 MG tablet Take 10 mg by mouth every other day.     warfarin (COUMADIN) 5 MG tablet Take 5 mg by mouth every other day.       Discharge Medications: Please see discharge summary for a list of  discharge medications.  Relevant Imaging Results:  Relevant Lab Results:   Additional Information SSN:[id number]  Archie Endo, LCSW

## 2022-09-28 NOTE — ED Provider Notes (Addendum)
[facility] EMERGENCY DEPARTMENT Provider Note   CSN: [id number] Arrival date & time: 10/01/2022  7867  An emergency department physician performed an initial assessment on this suspected stroke patient at 0927.  History  Chief Complaint  Patient presents with   Code Stroke    [name] is a 76 y.o. female.  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  The pt was eating this am around 0830 and had a sudden onset of left sided gaze and right sided weakness.  EMS was called and a code stroke was called en route.  Pt met at the bridge by neurology and myself.         Home Medications Prior to Admission medications   Medication Sig Start Date End Date Taking? Authorizing Provider  acetaminophen (TYLENOL) 325 MG tablet Take 1-2 tablets (325-650 mg total) by mouth every 4 (four) hours as needed for mild pain. Patient taking differently: Take 650 mg by mouth every 6 (six) hours as needed for mild pain. 05/26/22   Angiulli, Lavon Paganini, PA-C  Amino Acids-Protein Hydrolys (FEEDING SUPPLEMENT, PRO-STAT SUGAR FREE 64,) LIQD Take 30 mLs by mouth daily. Patient not taking: Reported on 09/24/2022    [provider]  amLODipine (NORVASC) 5 MG tablet Take 1 tablet (5 mg total) by mouth in the morning. 08/30/22   Hongalgi, Lenis Dickinson, MD  atorvastatin (LIPITOR) 40 MG tablet Take 1 tablet (40 mg total) by mouth daily. 05/26/22   Angiulli, Lavon Paganini, PA-C  bisacodyl (DULCOLAX) 10 MG suppository Place 1 suppository (10 mg total) rectally daily as needed for moderate constipation or mild constipation. Patient not taking: Reported on 09/24/2022 08/30/22   Modena Jansky, MD  Calcium Carbonate-Vit D-Min (CALCIUM 1200) 1200-1000 MG-UNIT CHEW Chew 1 tablet by mouth in the morning.    [provider]  Cholecalciferol (VITAMIN D-3) 25 MCG (1000 UT) CAPS Take 1,000 Units by mouth in the morning.     [provider]  denosumab (PROLIA) 60 MG/ML SOSY injection Inject 60 mg into the skin every 6 (six) months.    [provider]  Menthol, Topical Analgesic, (BIOFREEZE) 4 % GEL Apply 1 application  topically every 4 (four) hours as needed (mild pain). To back. Patient not taking: Reported on 09/24/2022    [provider]  methocarbamol (ROBAXIN) 500 MG tablet Take 1 tablet (500 mg total) by mouth 3 (three) times daily. 05/26/22   Angiulli, Lavon Paganini, PA-C  omeprazole (PRILOSEC OTC) 20 MG tablet Take 20 mg by mouth in the morning.    [provider]  ondansetron (ZOFRAN) 4 MG tablet Take 4 mg by mouth every 8 (eight) hours as needed for nausea or vomiting.    [provider]  PARoxetine (PAXIL) 10 MG tablet Take 10 mg by mouth in the morning.    [provider]  polyethylene glycol (MIRALAX / GLYCOLAX) 17 g packet Take 17 g by mouth 2 (two) times daily. 08/30/22   Hongalgi, Lenis Dickinson, MD  senna-docusate (SENOKOT-S) 8.6-50 MG tablet Take 1 tablet by mouth 2 (two) times daily. 08/30/22   Hongalgi, Lenis Dickinson, MD  warfarin (COUMADIN) 10 MG tablet Take 10 mg by mouth every other day.    [provider]  warfarin (COUMADIN) 5 MG tablet Take 5 mg by mouth every other day.    [provider]      Allergies  Zoloft [sertraline]    Review of Systems   Review of Systems  Neurological:  Positive for weakness.  All other systems reviewed and are negative.   Physical Exam Updated Vital Signs BP 104/78   Pulse 99   Temp 98.4 F (36.9 C) (Axillary)   Resp 16   Ht _0  (1.676 m)   Wt 53.6 kg   SpO2 100%   BMI 19.07 kg/m  Physical Exam Vitals and nursing note reviewed.  Constitutional:      Appearance: Normal appearance.  HENT:     Head: Normocephalic and atraumatic.     Right Ear: External ear normal.     Left Ear: External ear normal.     Nose: Nose normal.     Mouth/Throat:     Mouth: Mucous membranes are dry.  Eyes:      Extraocular Movements: Extraocular movements intact.     Conjunctiva/sclera: Conjunctivae normal.     Pupils: Pupils are equal, round, and reactive to light.  Cardiovascular:     Rate and Rhythm: Regular rhythm. Tachycardia present.     Pulses: Normal pulses.     Heart sounds: Normal heart sounds.  Pulmonary:     Effort: Pulmonary effort is normal.     Breath sounds: Normal breath sounds.  Abdominal:     General: Abdomen is flat. Bowel sounds are normal.     Palpations: Abdomen is soft.  Musculoskeletal:        General: Normal range of motion.     Cervical back: Normal range of motion and neck supple.  Skin:    General: Skin is warm.     Capillary Refill: Capillary refill takes less than 2 seconds.  Neurological:     Mental Status: She is alert.     Comments: Pt has a left gaze preference, right hemianopia, right arm weakness, bilateral leg weakness (prior left sided weakness from prior stroke), expressive aphasia and dysarthria.  Psychiatric:        Mood and Affect: Mood normal.        Behavior: Behavior normal.     ED Results / Procedures / Treatments   Labs (all labs ordered are listed, but only abnormal results are displayed) Labs Reviewed  PROTIME-INR - Abnormal; Notable for the following components:      Result Value   Prothrombin Time 40.7 (*)    INR 4.3 (*)    All other components within normal limits  CBC - Abnormal; Notable for the following components:   Hemoglobin 11.6 (*)    HCT 35.6 (*)    All other components within normal limits  COMPREHENSIVE METABOLIC PANEL - Abnormal; Notable for the following components:   Glucose, Bld 146 (*)    Total Protein 5.7 (*)    Albumin 3.1 (*)    Total Bilirubin 0.2 (*)    All other components within normal limits  I-STAT CHEM 8, ED - Abnormal; Notable for the following components:   BUN 25 (*)    Glucose, Bld 157 (*)    Calcium, Ion 1.05 (*)    All other components within normal limits  CBG MONITORING, ED -  Abnormal; Notable for the following components:   Glucose-Capillary 147 (*)    All other components within normal limits  DIFFERENTIAL  APTT  ETHANOL  URINALYSIS, ROUTINE W REFLEX MICROSCOPIC    EKG EKG Interpretation  Date/Time:  Tuesday September 28 2022 09:52:58 EST Ventricular Rate:  114 PR Interval:  143 QRS Duration: 94 QT  Interval:  387 QTC Calculation: 533 R Axis:   -79 Text Interpretation: Sinus or ectopic atrial tachycardia Inferior infarct, old Consider anterior infarct Prolonged QT interval Since last tracing rate faster Confirmed by Isla Pence [phone]) on 10/06/2022 9:56:16 AM  Radiology EEG adult  Result Date: 09/10/2022 Lora Havens, MD     09/13/2022 11:23 AM Patient Name: ADELLA MANOLIS MRN: [id number] Epilepsy Attending: Lora Havens Referring Physician/Provider: Greta Doom, MD Date: 09/08/2022 Duration: 22.05 mins Patient history: 76 year old female presented with forced left gaze, right sided weakness/sensory deficits.  EEG to evaluate for seizure. Level of alertness:  lethargic AEDs during EEG study: None Technical aspects: This EEG study was done with scalp electrodes positioned according to the 10-20 International system of electrode placement. Electrical activity was reviewed with band pass filter of 1-_0 , sensitivity of 7 uV/mm, display speed of 61m/sec with a _1  notched filter applied as appropriate. EEG data were recorded continuously and digitally stored.  Video monitoring was available and reviewed as appropriate. Description: EEG showed continuous low amplitude 2 to 3 Hz delta slowing in left hemisphere.  Additionally there was 5 to 8 Hz theta-alpha activity admixed with 12 to 15 Hz beta activity in right hemisphere.  Hyperventilation and photic stimulation were not performed.   ABNORMALITY -Continuous slow, generalized and lateralized left hemisphere IMPRESSION: This study is suggestive of cortical dysfunction in left hemisphere,  likely secondary to underlying structural abnormality, postictal state.  Additionally there was moderate diffuse encephalopathy, nonspecific etiology.  No seizures or epileptiform discharges were seen throughout the recording. Priyanka [name]  CT ANGIO HEAD NECK W WO CM W PERF (CODE STROKE)  Result Date: 09/19/2022 CLINICAL DATA:  76year old female code stroke presentation with leftward gaze, aphasia. EXAM: CT ANGIOGRAPHY HEAD AND NECK CT PERFUSION BRAIN TECHNIQUE: Multidetector CT imaging of the head and neck was performed using the standard protocol during bolus administration of intravenous contrast. Multiplanar CT image reconstructions and MIPs were obtained to evaluate the vascular anatomy. Carotid stenosis measurements (when applicable) are obtained utilizing NASCET criteria, using the distal internal carotid diameter as the denominator. Multiphase CT imaging of the brain was performed following IV bolus contrast injection. Subsequent parametric perfusion maps were calculated using RAPID software. RADIATION DOSE REDUCTION: This exam was performed according to the departmental dose-optimization program which includes automated exposure control, adjustment of the mA and/or kV according to patient size and/or use of iterative reconstruction technique. CONTRAST:  774mOMNIPAQUE IOHEXOL 350 MG/ML SOLN COMPARISON:  Plain head CT 0932 hours today. Previous CTA head and neck 04/26/2022 and earlier. FINDINGS: CT Brain Perfusion Findings: ASPECTS: 10 (chronic encephalomalacia) CBF (<30%) Volume: 72m28merfusion (Tmax>6.0s) volume: 72mL18msmatch Volume: Not applicable Infarction Location:None detected CTA NECK Skeleton: Cervical spine degeneration appears stable. Chronic right vertex craniotomy. No acute osseous abnormality identified. Upper chest: Stable, negative. Other neck: Stable, negative. Aortic arch: Stable mild tortuosity of the aortic arch. Three vessel arch configuration. Mild for age arch  atherosclerosis. Right carotid system: Minimal brachiocephalic artery plaque without stenosis. Mildly tortuous right CCA with no plaque or stenosis. No significant cervical right ICA plaque or stenosis. Left carotid system: Left CCA origin remains normal. Minimal left CCA plaque without stenosis before the bifurcation. Mild mostly calcified plaque at the left carotid bifurcation. No cervical left ICA stenosis. Vertebral arteries: Minimal plaque in the proximal right subclavian artery. Normal right vertebral artery origin. Patent right vertebral to the skull base with no plaque or stenosis. Stable mild mostly calcified plaque  proximal left subclavian artery without stenosis. Normal left vertebral artery origin. Patent mildly dominant left vertebral artery to the skull base with no plaque or stenosis. CTA HEAD Posterior circulation: Stable distal vertebral arteries without plaque or stenosis. Left V4 segment is dominant. Right PICA origin remains patent. However, the Left PICA origin appears to be occluded now, was normal in June. Small caliber left AICA appears to remain patent. There is some reconstituted left PICA flow. Vertebrobasilar junction and basilar artery appear stable, negative. SCA and right PCA origin remain normal. Fetal type left PCA origin redemonstrated with small tortuous left P1. Bilateral PCA branches are stable and within normal limits. Anterior circulation: Both ICA siphons are patent. No siphon plaque or stenosis. Normal ophthalmic and left posterior communicating artery origins. Patent carotid termini, MCA and ACA origins. Tortuous and mildly dominant right A1 segment redemonstrated. Normal anterior communicating artery. Bilateral ACA branches are stable and within normal limits. Right MCA M1 segment, bifurcation, and right MCA branches are stable and within normal limits. Left MCA M1 segment, bifurcation, and left MCA branches are stable and within normal limits. Venous sinuses: Not well  evaluated due to early contrast timing. Anatomic variants: Fetal left PCA origin. Mildly dominant left vertebral artery and right ACA A1 segments. Review of the MIP images confirms the above findings IMPRESSION: 1. 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 Faint left PICA reconstituted enhancement. 2. But otherwise stable and largely negative for age CTA Head and Neck; mild for age atherosclerosis and no other arterial stenosis or occlusion identified. 3. CT Perfusion detects no infarct core or ischemic penumbra. Salient findings were communicated to Dr. Leonel Ramsay at 10:04 am on 09/15/2022 by text page via the [facility] messaging system. Electronically Signed   By: Genevie Ann M.D.   On: 09/21/2022 10:09   CT HEAD CODE STROKE WO CONTRAST  Result Date: 09/09/2022 CLINICAL DATA:  Code stroke. 76 year old female with aphasia and left gaze. EXAM: CT HEAD WITHOUT CONTRAST TECHNIQUE: Contiguous axial images were obtained from the base of the skull through the vertex without intravenous contrast. RADIATION DOSE REDUCTION: This exam was performed according to the departmental dose-optimization program which includes automated exposure control, adjustment of the mA and/or kV according to patient size and/or use of iterative reconstruction technique. COMPARISON:  Brain MRI 12/28/2021.  Head CT 08/27/2022. FINDINGS: Brain: Chronic posterior frontal, right parietal, and lesser temporal/occipital junction encephalomalacia appears stable with overlying craniotomy. No midline shift, mass effect, or evidence of intracranial mass lesion. No acute intracranial hemorrhage identified. 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 No cortically based acute infarct identified. Vascular: Mild Calcified atherosclerosis at the skull base. No suspicious  intracranial vascular hyperdensity. Skull: Previous right posterior craniotomy is stable. No acute osseous abnormality identified. Sinuses/Orbits: Visualized paranasal sinuses and mastoids are stable and well aerated. Other: Leftward gaze is new. Chronic postoperative changes to the scalp. ASPECTS [facility] Stroke Program Early CT Score) Total score (0-10 with 10 being normal): 10 IMPRESSION: 1. No acute cortically based infarct or acute intracranial hemorrhage identified. ASPECTS 10. 2. Stable non contrast CT appearance of the brain, with chronic small vessel ischemia and chronic right hemisphere encephalomalacia, craniotomy. 3. These results were communicated to Dr. Leonel Ramsay at 9:41 am on 09/11/2022 by text page via the [facility] messaging system. Electronically Signed   By: Herminio Heads.D.  On: 09/30/2022 09:41    Procedures Procedures    Medications Ordered in ED Medications  sodium chloride flush (NS) 0.9 % injection 3 mL (has no administration in time range)  iohexol (OMNIPAQUE) 350 MG/ML injection 75 mL (75 mLs Intravenous Contrast Given 09/22/2022 9629)    ED Course/ Medical Decision Making/ A&P                           Medical Decision Making Amount and/or Complexity of Data Reviewed Labs: ordered. Radiology: ordered.  Risk Decision regarding hospitalization.   This patient presents to the ED for concern of weakness, this involves an extensive number of treatment options, and is a complaint that carries with it a high risk of complications and morbidity.  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  The  pertinent results include:  cbc with hgb 11.6 (chronic), inr elevated at 4.3; cmp nl   Imaging Studies ordered:  I ordered imaging studies including ct head/ct angio  I independently visualized and interpreted imaging which showed  CT head: 1. No acute cortically based infarct or acute intracranial  hemorrhage identified. ASPECTS 10.  2. Stable non contrast CT appearance of the brain, with chronic  small vessel ischemia and chronic right hemisphere encephalomalacia,  craniotomy.  CT angio: 1. 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 Faint left PICA reconstituted enhancement.    2. But otherwise stable and largely negative for age CTA Head and  Neck; mild for age atherosclerosis and no other arterial stenosis or  occlusion identified.    3. CT Perfusion detects no infarct core or ischemic penumbra.   I agree with the radiologist interpretation   Cardiac Monitoring:  The patient was maintained on a cardiac monitor.  I personally viewed and interpreted the cardiac monitored which showed an underlying rhythm of: st   Medicines ordered and prescription drug management:   I have reviewed the patients home medicines and have made adjustments as needed   Test Considered:  EEG: -Continuous slow, generalized and lateralized left hemisphere   IMPRESSION: This study is suggestive of cortical dysfunction in left hemisphere, likely secondary to underlying structural abnormality, postictal state.  Additionally there was moderate diffuse encephalopathy, nonspecific etiology.  No seizures or epileptiform discharges were seen throughout the recording.   Critical Interventions:  Code stroke.     Consultations Obtained:  I requested consultation with the neurologist ,  and discussed lab and imaging findings as well as pertinent plan - Dr. Leonel Ramsay met pt at the bridge.  He also ordered an EEG. Pt d/w Dr. Lorin Mercy (triad) for  admission.   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  Pt is not a candidate for tnk as she is on coumadin   Reevaluation:  After the interventions noted above, I reevaluated the patient and found that they have :stayed the same   Social Determinants of Health:  Lives in a snf   Dispostion:  After consideration of the diagnostic results and the patients response to treatment, I feel that the  patent would benefit from admission.    CRITICAL CARE Performed by: Isla Pence   Total critical care time: 30 minutes  Critical care time was exclusive of separately billable procedures and treating other patients.  Critical care was necessary to treat or prevent imminent or life-threatening deterioration.  Critical care was time spent personally by me on the following activities: development of treatment plan with patient and/or surrogate as well as nursing, discussions with consultants, evaluation of patient's response to treatment, examination of patient, obtaining history from patient or surrogate, ordering and performing treatments and interventions, ordering and review of laboratory studies, ordering and review of radiographic studies, pulse oximetry and re-evaluation of patient's condition.         Final Clinical Impression(s) / ED Diagnoses Final diagnoses:  Cerebrovascular accident (CVA), unspecified mechanism ([place])  Elevated INR    Rx / DC Orders ED Discharge Orders     None         Isla Pence, MD 09/18/2022 [place]    Isla Pence, MD 09/22/2022 1123

## 2022-09-28 NOTE — Consult Note (Signed)
Consultation Note Date: 09/30/2022   Patient Name: [name]  DOB: 03-17-1946  MRN: [id number]  Age / Sex: 76 y.o., female  PCP: Tisovec, [name], MD Referring Physician: Karmen Bongo, MD  Reason for Consultation: Establishing goals of care  HPI/Patient Profile: 76 y.o. female  with past medical history of  DM, multiple CVAs with hemiparesis, MVR on Coumadin, and HTN presented to ED on 09/20/2022 from [facility] with stroke-like symptoms - left sided gaze, right sided weakness. Patient was admitted on 09/29/2022 with acute CVA.   Of note, patient has had 3 hospital admissions in the last 6 months and is a 30 day readmission.  Clinical Assessment and Goals of Care: I have reviewed medical records including EPIC notes, labs, and imaging. Received report from primary RN - no acute concerns. RN reports patient is awake with mumbled speech, not much meaningful interaction.   Went to visit patient at bedside - husband/Robert present. Patient was lying in bed awake - unable to assess fully due to echocardiogram in progress. No signs or non-verbal gestures of pain or discomfort noted. No respiratory distress, increased work of breathing, or secretions noted.   Met with [name] in ED conference room  to discuss diagnosis, prognosis, [place], EOL wishes, disposition, and options.  I introduced Palliative Medicine as specialized medical care for people living with serious illness. It focuses on providing relief from the symptoms and stress of a serious illness. The goal is to improve quality of life for both the patient and the family.  We discussed a brief life review of the patient as well as functional and nutritional status. Patient is a retired Oncologist. She and [name] have two children together - one daughter and one son. Prior to hospitalization, patient was living at [facility], where she has been  since July of this year. She has gotten progressively weaker over the last several months. She was able to ambulate with a walker; however, now, has a hard time standing on her own. She is able to transfer to a wheelchair with assistance. She is not able to dress or bathe herself. [name] tells me patient has also been losing weight - albumin noted at 3.1 on 09/18/2022. Therapeutic listening provided as [name] tells me this is the patient's 7th stroke, he describes each, and also reflects on her recent hospitalization for brain bleed in June.   We discussed patient's current illness and what it means in the larger context of patient's on-going co-morbidities. [name] has a clear understanding of patient's current acute medical situation. We reviewed patient's current acute right sided deficits in context of her residual left sided deficits from previous strokes. We also discussed concern about her swallowing function and possible aphasia. Reviewed SLP evaluation that showed severe aspiration risk. Discussed thoughts around feeding tubes in context of prolonging life vs letting nature take it's course after patient's acute stroke. Education provided on the difference between coretrak and PEG tube. Natural disease trajectory and expectations at EOL were discussed. I attempted to elicit values and goals of care important to the patient. The difference between aggressive medical intervention and comfort care was considered in light of the patient's goals of care.     After discussion, [name] tells me he would be open for trial period for coretrak, but would not want to pursue PEG long term. He would be open to hospice discussions if patient not able to [place] safely on her own. He would want her to return to [facility] with  hospice if this were the case.  At this time, [name] would like to allow time for outcomes while patient is in house.  Rehospitalization was considered and discussed. [name] tells me patient  was crying because she was brought to hospital today. Reviewed with [name] that patient is at high risk for rehospitalization as evidenced by her multiple readmissions in 6 months. He feels patient would not want to be rehospitalized once discharged. Briefly reviewed that hospice services could help support patient's goals to not be rehospitalized and focus on comfort and quality of life at [facility]. He will consider this information and would like ongoing discussions pending patient's clinical course.  Discussed with [name]  the importance of continued conversation with patient/family and the medical providers regarding overall plan of care and treatment options, ensuring decisions are within the context of the patient's values and GOCs.    Questions and concerns were addressed. The patient/family was encouraged to call with questions and/or concerns. PMT card was provided.   Primary Decision Maker: NEXT OF KIN - husband/Robert Bellotti    SUMMARY OF RECOMMENDATIONS   Continue full scope and allow time for outcomes Continue DNR/DNI as previously documented Husband is open for trial period of coretrak if indicated; he does not want to pursue PEG tube. If patient cannot eat/drink on her own safely, he is open to hospice Overall, husband does not think patient would want to be rehospitalized after discharge and is considering hospice services at [facility] - he requests ongoing discussions pending patient's clinical course  Patient is already enrolled with [facility] outpatient [facility]iative Care PMT will continue to follow and support holistically   Code Status/Advance Care Planning: DNR  Palliative Prophylaxis:  Aspiration, Delirium Protocol, Frequent Pain Assessment, Oral Care, and Turn Reposition  Additional Recommendations (Limitations, Scope, Preferences): Full Scope Treatment, No Artificial Feeding, and No Tracheostomy  Psycho-social/Spiritual:  Desire for further Chaplaincy  support:no Created space and opportunity for patient and family to express thoughts and feelings regarding patient's current medical situation.  Emotional support and therapeutic listening provided.  Prognosis:  Unable to determine  Discharge Planning: To Be Determined      Primary Diagnoses: Present on Admission:  Acute CVA (cerebrovascular accident) ([place])  HTN (hypertension)  HLD (hyperlipidemia)  DNR (do not resuscitate)   I have reviewed the medical record, interviewed the patient and family, and examined the patient. The following aspects are pertinent.  Past Medical History:  Diagnosis Date   Abnormality of gait 09/07/2016   Allergy    Diabetes mellitus without complication [facility])    Patient denies this - notes history of glucose intolerance   GERD (gastroesophageal reflux disease)    Hemiparesis and alteration of sensations as late effects of stroke ([place]) 09/07/2016   History of pneumonia 1997   Hypertension    S/P MVR (mitral valve replacement)    Mechanical mitral valve replacement at age 12 (done in [place])  // echo 7/17: EF 55-60, normal wall motion, bileaflet mechanical mitral valve prosthesis functioning normally, mild LAE, mildly reduced RVSF, small pericardial effusion   Stroke ([place]) 1997, 2013, 2015   Social History   Socioeconomic History   Marital status: Married    Spouse name: Not on file   Number of children: 2   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   Highest education level: Not on file  Occupational History   Occupation: Retired  Tobacco Use   Smoking status: Never    Passive exposure: Never   Smokeless tobacco: Never  Vaping Use   Vaping Use: Never used  Substance and Sexual Activity   Alcohol use: No    Alcohol/week: 0.0 standard drinks of alcohol   Drug use: No   Sexual activity: Not Currently    Partners: Male    Comment: Married  Other Topics Concern   Not on file  Social History Narrative   Lives at home w/ her husband    Right-handed   Caffeine: none   Social Determinants of Health   Financial Resource Strain: Not on file  Food Insecurity: No Food Insecurity (07/18/2020)   Hunger Vital Sign    Worried About Running Out of Food in the Last Year: Never true    Ran Out of Food in the Last Year: Never true  Transportation Needs: No Transportation Needs (07/18/2020)   PRAPARE - Hydrologist (Medical): No    Lack of Transportation (Non-Medical): No  Physical Activity: Not on file  Stress: Not on file  Social Connections: Not on file   Family History  Problem Relation Age of Onset   Cancer Mother        Bone   Heart disease Mother    Hyperlipidemia Mother    Hypertension Mother    Stroke Father    Hypertension Father    Heart attack Neg Hx    Scheduled Meds:   stroke: early stages of recovery book   Does not apply Once   aspirin  300 mg Rectal Daily   Or   aspirin  325 mg Oral Daily   [START ON 09/29/2022] atorvastatin  40 mg Oral Daily   Warfarin - Pharmacist Dosing Inpatient   Does not apply q1600   Continuous Infusions:  sodium chloride     PRN Meds:.acetaminophen **OR** acetaminophen (TYLENOL) oral liquid 160 mg/5 mL **OR** acetaminophen, bisacodyl Medications Prior to Admission:  Prior to Admission medications   Medication Sig Start Date End Date Taking? Authorizing Provider  acetaminophen (TYLENOL) 325 MG tablet Take 1-2 tablets (325-650 mg total) by mouth every 4 (four) hours as needed for mild pain. Patient taking differently: Take 650 mg by mouth every 6 (six) hours as needed for mild pain. 05/26/22  Yes Angiulli, Lavon Paganini, PA-C  amLODipine (NORVASC) 5 MG tablet Take 1 tablet (5 mg total) by mouth in the morning. 08/30/22  Yes Hongalgi, Lenis Dickinson, MD  atorvastatin (LIPITOR) 40 MG tablet Take 1 tablet (40 mg total) by mouth daily. 05/26/22  Yes Angiulli, Lavon Paganini, PA-C  bisacodyl (DULCOLAX) 10 MG suppository Place 10 mg rectally daily as needed for moderate  constipation.   Yes [provider]  Calcium Carb-Cholecalciferol (CALCIUM + VITAMIN D3) 600-10 MG-MCG TABS Take 2 tablets by mouth daily.   Yes [provider]  Cholecalciferol (VITAMIN D-3) 25 MCG (1000 UT) CAPS Take 1,000 Units by mouth in the morning.   Yes [provider]  denosumab (PROLIA) 60 MG/ML SOSY injection Inject 60 mg into the skin every 6 (six) months.   Yes [provider]  Menthol, Topical Analgesic, (BIOFREEZE) 4 % GEL Apply 1 Application topically every 4 (four) hours as needed (mild pain). Apply to back   Yes [provider]  methocarbamol (ROBAXIN) 500 MG tablet Take 1 tablet (500 mg total) by mouth 3 (three) times daily. 05/26/22  Yes Angiulli, Lavon Paganini, PA-C  omeprazole (PRILOSEC) 40 MG capsule Take 40 mg by mouth in the morning.   Yes [provider]  ondansetron (ZOFRAN) 4 MG tablet  Take 4 mg by mouth every 6 (six) hours as needed for nausea or vomiting.   Yes [provider]  PARoxetine (PAXIL) 10 MG tablet Take 10 mg by mouth in the morning.   Yes [provider]  polyethylene glycol (MIRALAX / GLYCOLAX) 17 g packet Take 17 g by mouth 2 (two) times daily. Patient taking differently: Take 17 g by mouth every evening. 08/30/22  Yes Hongalgi, Lenis Dickinson, MD  senna-docusate (SENOKOT-S) 8.6-50 MG tablet Take 1 tablet by mouth 2 (two) times daily. 08/30/22  Yes Hongalgi, Lenis Dickinson, MD  warfarin (COUMADIN) 10 MG tablet Take 10 mg by mouth every other day.   Yes [provider]  warfarin (COUMADIN) 5 MG tablet Take 5 mg by mouth every other day.   Yes [provider]   Allergies  Allergen Reactions   Zoloft [Sertraline] Hives, Itching and Rash   Review of Systems  Unable to perform ROS: Acuity of condition    Physical Exam Vitals and nursing note reviewed.  Constitutional:      General: She is not in acute distress.    Appearance: She is ill-appearing.  Pulmonary:     Effort: No  respiratory distress.  Skin:    General: Skin is warm and dry.  Neurological:     Mental Status: She is alert.     Motor: Weakness present.     Vital Signs: BP (!) 145/76   Pulse (!) 109   Temp 98.1 F (36.7 C)   Resp 18   Ht _0  (1.676 m)   Wt 53.6 kg   SpO2 95%   BMI 19.07 kg/m  Pain Scale: PAINAD       SpO2: SpO2: 95 % O2 Device:SpO2: 95 % O2 Flow Rate: .   IO: Intake/output summary: No intake or output data in the 24 hours ending 09/12/2022 1637  LBM:   Baseline Weight: Weight: 53.6 kg Most recent weight: Weight: 53.6 kg     Palliative Assessment/Data: PPS 10% due to NPO status and severe aspiration risk     Time In: 1510 Time Out: 1640 Time Total: 90 minutes  Greater than 50%  of this time was spent counseling and coordinating care related to the above assessment and plan.  Signed by: Lin Landsman, NP   Please contact Palliative Medicine Team phone at [phone] for questions and concerns.  For individual provider: See Amion  *Portions of this note are a verbal dictation therefore any spelling and/or grammatical errors are due to the "[place] [employer]" system interpretation.

## 2022-09-29 ENCOUNTER — Encounter (HOSPITAL_COMMUNITY): Payer: Self-pay | Admitting: Internal Medicine

## 2022-09-29 DIAGNOSIS — I10 Essential (primary) hypertension: Secondary | ICD-10-CM | POA: Diagnosis not present

## 2022-09-29 DIAGNOSIS — Z515 Encounter for palliative care: Secondary | ICD-10-CM | POA: Diagnosis not present

## 2022-09-29 DIAGNOSIS — I639 Cerebral infarction, unspecified: Secondary | ICD-10-CM | POA: Diagnosis not present

## 2022-09-29 DIAGNOSIS — Z7189 Other specified counseling: Secondary | ICD-10-CM | POA: Diagnosis not present

## 2022-09-29 DIAGNOSIS — R1312 Dysphagia, oropharyngeal phase: Secondary | ICD-10-CM | POA: Diagnosis not present

## 2022-09-29 DIAGNOSIS — F4321 Adjustment disorder with depressed mood: Secondary | ICD-10-CM | POA: Diagnosis not present

## 2022-09-29 DIAGNOSIS — I3139 Other pericardial effusion (noninflammatory): Secondary | ICD-10-CM

## 2022-09-29 DIAGNOSIS — R791 Abnormal coagulation profile: Secondary | ICD-10-CM | POA: Diagnosis not present

## 2022-09-29 DIAGNOSIS — D696 Thrombocytopenia, unspecified: Secondary | ICD-10-CM | POA: Diagnosis not present

## 2022-09-29 LAB — GLUCOSE, CAPILLARY: Glucose-Capillary: 109 mg/dL — ABNORMAL HIGH (ref 70–99)

## 2022-09-29 LAB — LIPID PANEL
Cholesterol: 153 mg/dL (ref 0–200)
HDL: 53 mg/dL (ref >40)
LDL Cholesterol: 81 mg/dL (ref 0–99)
Total CHOL/HDL Ratio: 2.9 RATIO
Triglycerides: 97 mg/dL (ref <150)
VLDL: 19 mg/dL (ref 0–40)

## 2022-09-29 LAB — PROTIME-INR
INR: 3.2 — ABNORMAL HIGH (ref 0.8–1.2)
Prothrombin Time: 32.2 seconds — ABNORMAL HIGH (ref 11.4–15.2)

## 2022-09-29 LAB — CBG MONITORING, ED
Glucose-Capillary: 126 mg/dL — ABNORMAL HIGH (ref 70–99)
Glucose-Capillary: 105 mg/dL — ABNORMAL HIGH (ref 70–99)

## 2022-09-29 MED ORDER — ASPIRIN 81 MG PO CHEW
81.0000 mg | CHEWABLE_TABLET | Freq: Every day | ORAL | Status: DC
  Administered 2022-09-30 – 2022-10-01 (×2): 81 mg via ORAL
  Filled 2022-09-29 (×2): qty 1

## 2022-09-29 MED ORDER — WARFARIN SODIUM 5 MG PO TABS
10.0000 mg | ORAL_TABLET | Freq: Once | ORAL | Status: AC
  Filled 2022-09-29: qty 1

## 2022-09-29 MED ORDER — OSMOLITE 1.2 CAL PO LIQD
1000.0000 mL | ORAL | Status: DC
  Filled 2022-09-29: qty 1000

## 2022-09-29 NOTE — ED Notes (Signed)
Taken via transport on Biomedical scientist to [phone].

## 2022-09-29 NOTE — ED Notes (Signed)
This RN removed layers of covers/linen underneath of patient. Provided clean linen for bed, and brief changed.  Repositioned in bed to comfort.

## 2022-09-29 NOTE — Progress Notes (Addendum)
STROKE TEAM PROGRESS NOTE   INTERVAL HISTORY Her husband is at the bedside.  He states that she has a number of strokes over the years as well as a SAH. She is on coumadin. The patient herself is tearful, oriented to person and place but not situation or time. Palliative care consulted.   Vitals:   09/29/22 0230 09/29/22 0300 09/29/22 0400 09/29/22 0641  BP: 119/82 (!) 128/94 (!) 143/73 116/80  Pulse: 85 97 83 91  Resp: 14 (!) '22 20 17  '$ Temp:   97.8 F (36.6 C)   TempSrc:   Oral   SpO2: 98% 96% 97% 97%  Weight:      Height:       CBC:  Recent Labs  Lab 10/02/2022 0926 10/06/2022 0931  WBC 5.4  --   NEUTROABS 3.9  --   HGB 11.6* 12.9  HCT 35.6* 38.0  MCV 83.0  --   PLT 172  --    Basic Metabolic Panel:  Recent Labs  Lab 09/09/2022 0926 09/24/2022 0931  NA 137 139  K 3.8 4.1  CL 101 102  CO2 27  --   GLUCOSE 146* 157*  BUN 21 25*  CREATININE 0.79 0.70  CALCIUM 9.0  --    Lipid Panel:  Recent Labs  Lab 09/29/22 0324  CHOL 153  TRIG 97  HDL 53  CHOLHDL 2.9  VLDL 19  LDLCALC 81   HgbA1c: No results for input(s): "HGBA1C" in the last 168 hours. Urine Drug Screen: No results for input(s): "LABOPIA", "COCAINSCRNUR", "LABBENZ", "AMPHETMU", "THCU", "LABBARB" in the last 168 hours.  Alcohol Level  Recent Labs  Lab 09/15/2022 0954  ETH <10    IMAGING past 24 hours ECHOCARDIOGRAM COMPLETE  Result Date: 09/09/2022    ECHOCARDIOGRAM REPORT   Patient Name:   [name] Date of Exam: 10/04/2022 Medical Rec #:  [id number]      Height:       66.0 in Accession #:    [id number]     Weight:       118.2 lb Date of Birth:  April 17, 1946      BSA:          1.599 m Patient Age:    89 years       BP:           117/84 mmHg Patient Gender: F              HR:           111 bpm. Exam Location:  Inpatient Procedure: 2D Echo Indications:    stroke  History:        Patient has prior history of Echocardiogram examinations, most                 recent 07/02/2020. Stroke, MV replacement; Risk  Factors:Diabetes                 and Hypertension.  Sonographer:    Harvie Junior Referring Phys: [place]  Sonographer Comments: Technically difficult study due to poor echo windows. IMPRESSIONS  1. Left ventricular ejection fraction, by estimation, is 70 to 75%. The left ventricle has hyperdynamic function. The left ventricle demonstrates regional wall motion abnormalities (abnormal septal motion). Left ventricular diastolic parameters are indeterminate.  2. Right ventricular systolic function is hyperdynamic. The right ventricular size is not well visualized. There is normal pulmonary artery systolic pressure.  3. Moderate pericardial effusion, worst at LV posterior. The pericardial effusion is  circumferential and posterior to the left ventricle. There is no mitral valve inflow variation seen. There is no RV collapse. There is no IVC plethora  4. The mitral valve has been replaced. No evidence of mitral valve regurgitation. Study is inadequate to view prosthetic function: significant tachycardia during exam (up to at least 155 bpm). This with off axis imaging makes this study technically difficult. The mean gradient of 4 despite tachycardia is encouraging of the valve function  5. Tricuspid valve regurgitation is moderate to severe.  6. The aortic valve was not well visualized. Aortic valve regurgitation is not visualized. Conclusion(s)/Recommendation(s): Consider reimaging after heart rate control. FINDINGS  Left Ventricle: Left ventricular ejection fraction, by estimation, is 70 to 75%. The left ventricle has hyperdynamic function. The left ventricle demonstrates regional wall motion abnormalities. The left ventricular internal cavity size was small. There  is no left ventricular hypertrophy. Left ventricular diastolic parameters are indeterminate.  LV Wall Scoring: The anterior septum, mid inferoseptal segment, and basal inferoseptal segment are hypokinetic. The basal inferolateral segment is normal.  Right Ventricle: The right ventricular size is not well visualized. Right vetricular wall thickness was not well visualized. Right ventricular systolic function is hyperdynamic. There is normal pulmonary artery systolic pressure. The tricuspid regurgitant velocity is 2.74 m/s, and with an assumed right atrial pressure of 3 mmHg, the estimated right ventricular systolic pressure is 41.7 mmHg. Left Atrium: Left atrial size was not well visualized. Right Atrium: Right atrial size was not well visualized. Pericardium: A moderately sized pericardial effusion is present. The pericardial effusion is circumferential and posterior to the left ventricle. Mitral Valve: The mitral valve has been repaired/replaced. No evidence of mitral valve regurgitation. MV peak gradient, 13.1 mmHg. The mean mitral valve gradient is 4.0 mmHg. Tricuspid Valve: The tricuspid valve is normal in structure. Tricuspid valve regurgitation is moderate to severe. Aortic Valve: The aortic valve was not well visualized. Aortic valve regurgitation is not visualized. Pulmonic Valve: The pulmonic valve was not well visualized. Pulmonic valve regurgitation is not visualized. Aorta: The aortic root and ascending aorta are structurally normal, with no evidence of dilitation. IAS/Shunts: The interatrial septum was not well visualized.  LEFT VENTRICLE PLAX 2D LVIDd:         3.20 cm     Diastology LVIDs:         2.40 cm     LV e' medial:    11.60 cm/s LV PW:         0.90 cm     LV E/e' medial:  17.2 LV IVS:        1.10 cm     LV e' lateral:   13.60 cm/s LVOT diam:     1.80 cm     LV E/e' lateral: 14.6 LV SV:         24 LV SV Index:   15 LVOT Area:     2.54 cm  LV Volumes (MOD) LV vol d, MOD A4C: 64.8 ml LV vol s, MOD A4C: 28.8 ml LV SV MOD A4C:     64.8 ml RIGHT VENTRICLE RV Basal diam:  3.20 cm RV Mid diam:    2.70 cm RV S prime:     7.94 cm/s TAPSE (M-mode): 1.2 cm LEFT ATRIUM           Index        RIGHT ATRIUM           Index LA diam:      2.60 cm 1.63  cm/m   RA Area:     11.90 cm LA Vol (A4C): 24.5 ml 15.32 ml/m  RA Volume:   29.00 ml  18.14 ml/m  AORTIC VALVE             PULMONIC VALVE LVOT Vmax:   86.70 cm/s  PV Vmax:       0.87 m/s LVOT Vmean:  55.200 cm/s PV Peak grad:  3.0 mmHg LVOT VTI:    0.096 m  AORTA Ao Root diam: 3.70 cm Ao Asc diam:  3.60 cm MITRAL VALVE                TRICUSPID VALVE MV Area (PHT): 5.62 cm     TR Peak grad:   30.0 mmHg MV Area VTI:   0.87 cm     TR Vmax:        274.00 cm/s MV Peak grad:  13.1 mmHg MV Mean grad:  4.0 mmHg     SHUNTS MV Vmax:       1.81 m/s     Systemic VTI:  0.10 m MV Vmean:      78.7 cm/s    Systemic Diam: 1.80 cm MV Decel Time: 135 msec MV E velocity: 199.00 cm/s MV A velocity: 50.70 cm/s MV E/A ratio:  3.93 Rudean Haskell MD Electronically signed by Rudean Haskell MD Signature Date/Time: 09/21/2022/6:07:59 PM    Final    MR BRAIN WO CONTRAST  Result Date: 09/27/2022 CLINICAL DATA:  L Stroke, follow up EXAM: MRI HEAD WITHOUT CONTRAST TECHNIQUE: Multiplanar, multiecho pulse sequences of the brain and surrounding structures were obtained without intravenous contrast. COMPARISON:  Same day CT head.  MRI head December 28, 2021. FINDINGS: Brain: Multiple acute or early subacute infarcts involving the paramedian right cerebellum, left pons, bilateral hippocampi, left basal ganglia, left posterior limb of the internal capsule and scattered throughout the cerebral cortex bilaterally. No evidence of acute hemorrhage, mass lesion, midline shift or hydrocephalus. Chronic areas of posterior frontal, right parietal and to a lesser extent the left temporal/occipital junction encephalomalacia. Multiple remote cerebellar infarcts. Patchy T2/FLAIR hyperintensities the white matter, compatible with carotid leak [place] vascular ischemic disease. Superficial siderosis along the cerebral convexities bilaterally, compatible with prior subarachnoid hemorrhage. Vascular: Evaluated on same day CTA. Skull and upper  cervical spine: Right-sided parietal craniotomy. Otherwise, normal marrow signal. Sinuses/Orbits: Negative. Other: No sizable mastoid effusions. IMPRESSION: 1. Many small scattered acute or early subacute infarcts throughout the infratentorial and supratentorial brain, detailed above and probably embolic. 2. Multifocal encephalomalacia. 3. Evidence of prior subarachnoid hemorrhage. Electronically Signed   By: Margaretha Sheffield M.D.   On: 09/08/2022 15:08   EEG adult  Result Date: 09/16/2022 Lora Havens, MD     09/10/2022 11:23 AM Patient Name: LAYA LETENDRE MRN: [id number] Epilepsy Attending: Lora Havens Referring Physician/Provider: Greta Doom, MD Date: 09/16/2022 Duration: 22.05 mins Patient history: 76 year old female presented with forced left gaze, right sided weakness/sensory deficits.  EEG to evaluate for seizure. Level of alertness:  lethargic AEDs during EEG study: None Technical aspects: This EEG study was done with scalp electrodes positioned according to the 10-20 International system of electrode placement. Electrical activity was reviewed with band pass filter of 1-'70Hz'$ , sensitivity of 7 uV/mm, display speed of 53m/sec with a '60Hz'$  notched filter applied as appropriate. EEG data were recorded continuously and digitally stored.  Video monitoring was available and reviewed as appropriate. Description: EEG showed continuous low amplitude 2 to 3 Hz delta slowing in left  hemisphere.  Additionally there was 5 to 8 Hz theta-alpha activity admixed with 12 to 15 Hz beta activity in right hemisphere.  Hyperventilation and photic stimulation were not performed.   ABNORMALITY -Continuous slow, generalized and lateralized left hemisphere IMPRESSION: This study is suggestive of cortical dysfunction in left hemisphere, likely secondary to underlying structural abnormality, postictal state.  Additionally there was moderate diffuse encephalopathy, nonspecific etiology.  No seizures or  epileptiform discharges were seen throughout the recording. Priyanka Barbra Sarks   CT ANGIO HEAD NECK W WO CM W PERF (CODE STROKE)  Result Date: 10/01/2022 CLINICAL DATA:  76 year old female code stroke presentation with leftward gaze, aphasia. EXAM: CT ANGIOGRAPHY HEAD AND NECK CT PERFUSION BRAIN TECHNIQUE: Multidetector CT imaging of the head and neck was performed using the standard protocol during bolus administration of intravenous contrast. Multiplanar CT image reconstructions and MIPs were obtained to evaluate the vascular anatomy. Carotid stenosis measurements (when applicable) are obtained utilizing NASCET criteria, using the distal internal carotid diameter as the denominator. Multiphase CT imaging of the brain was performed following IV bolus contrast injection. Subsequent parametric perfusion maps were calculated using RAPID software. RADIATION DOSE REDUCTION: This exam was performed according to the departmental dose-optimization program which includes automated exposure control, adjustment of the mA and/or kV according to patient size and/or use of iterative reconstruction technique. CONTRAST:  21m OMNIPAQUE IOHEXOL 350 MG/ML SOLN COMPARISON:  Plain head CT 0932 hours today. Previous CTA head and neck 04/26/2022 and earlier. FINDINGS: CT Brain Perfusion Findings: ASPECTS: 10 (chronic encephalomalacia) CBF (<30%) Volume: 082mPerfusion (Tmax>6.0s) volume: 27m79mismatch Volume: Not applicable Infarction Location:None detected CTA NECK Skeleton: Cervical spine degeneration appears stable. Chronic right vertex craniotomy. No acute osseous abnormality identified. Upper chest: Stable, negative. Other neck: Stable, negative. Aortic arch: Stable mild tortuosity of the aortic arch. Three vessel arch configuration. Mild for age arch atherosclerosis. Right carotid system: Minimal brachiocephalic artery plaque without stenosis. Mildly tortuous right CCA with no plaque or stenosis. No significant cervical right ICA  plaque or stenosis. Left carotid system: Left CCA origin remains normal. Minimal left CCA plaque without stenosis before the bifurcation. Mild mostly calcified plaque at the left carotid bifurcation. No cervical left ICA stenosis. Vertebral arteries: Minimal plaque in the proximal right subclavian artery. Normal right vertebral artery origin. Patent right vertebral to the skull base with no plaque or stenosis. Stable mild mostly calcified plaque proximal left subclavian artery without stenosis. Normal left vertebral artery origin. Patent mildly dominant left vertebral artery to the skull base with no plaque or stenosis. CTA HEAD Posterior circulation: Stable distal vertebral arteries without plaque or stenosis. Left V4 segment is dominant. Right PICA origin remains patent. However, the Left PICA origin appears to be occluded now, was normal in June. Small caliber left AICA appears to remain patent. There is some reconstituted left PICA flow. Vertebrobasilar junction and basilar artery appear stable, negative. SCA and right PCA origin remain normal. Fetal type left PCA origin redemonstrated with small tortuous left P1. Bilateral PCA branches are stable and within normal limits. Anterior circulation: Both ICA siphons are patent. No siphon plaque or stenosis. Normal ophthalmic and left posterior communicating artery origins. Patent carotid termini, MCA and ACA origins. Tortuous and mildly dominant right A1 segment redemonstrated. Normal anterior communicating artery. Bilateral ACA branches are stable and within normal limits. Right MCA M1 segment, bifurcation, and right MCA branches are stable and within normal limits. Left MCA M1 segment, bifurcation, and left MCA branches are stable and within normal  limits. Venous sinuses: Not well evaluated due to early contrast timing. Anatomic variants: Fetal left PCA origin. Mildly dominant left vertebral artery and right ACA A1 segments. Review of the MIP images confirms the  above findings IMPRESSION: 1. 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 Faint left PICA reconstituted enhancement. 2. But otherwise stable and largely negative for age CTA Head and Neck; mild for age atherosclerosis and no other arterial stenosis or occlusion identified. 3. CT Perfusion detects no infarct core or ischemic penumbra. Salient findings were communicated to Dr. Leonel Ramsay at 10:04 am on 09/12/2022 by text page via the [facility] messaging system. Electronically Signed   By: Genevie Ann M.D.   On: 09/23/2022 10:09   CT HEAD CODE STROKE WO CONTRAST  Result Date: 09/15/2022 CLINICAL DATA:  Code stroke. 76 year old female with aphasia and left gaze. EXAM: CT HEAD WITHOUT CONTRAST TECHNIQUE: Contiguous axial images were obtained from the base of the skull through the vertex without intravenous contrast. RADIATION DOSE REDUCTION: This exam was performed according to the departmental dose-optimization program which includes automated exposure control, adjustment of the mA and/or kV according to patient size and/or use of iterative reconstruction technique. COMPARISON:  Brain MRI 12/28/2021.  Head CT 08/27/2022. FINDINGS: Brain: Chronic posterior frontal, right parietal, and lesser temporal/occipital junction encephalomalacia appears stable with overlying craniotomy. No midline shift, mass effect, or evidence of intracranial mass lesion. No acute intracranial hemorrhage identified. 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 No cortically based acute infarct identified. Vascular: Mild Calcified atherosclerosis at the skull base. No suspicious intracranial vascular hyperdensity. Skull: Previous right posterior craniotomy is stable. No acute osseous abnormality identified. Sinuses/Orbits: Visualized paranasal sinuses and  mastoids are stable and well aerated. Other: Leftward gaze is new. Chronic postoperative changes to the scalp. ASPECTS [facility] Stroke Program Early CT Score) Total score (0-10 with 10 being normal): 10 IMPRESSION: 1. No acute cortically based infarct or acute intracranial hemorrhage identified. ASPECTS 10. 2. Stable non contrast CT appearance of the brain, with chronic small vessel ischemia and chronic right hemisphere encephalomalacia, craniotomy. 3. These results were communicated to Dr. Leonel Ramsay at 9:41 am on 09/10/2022 by text page via the [facility] messaging system. Electronically Signed   By: Genevie Ann M.D.   On: 09/11/2022 09:41    PHYSICAL EXAM Constitutional: Appears well-developed and well-nourished.  Cardiovascular: Normal rate and regular rhythm.  Respiratory: Effort normal, non-labored breathing   Neuro: Mental Status: Patient is awake, alert, oriented to person, month, age.  She id not oriented to her situation or year.  Speech is dysarthric and hypophonic Cranial Nerves: II: She blinks to threat from the left but not right. Pupils are equal, round, and reactive to light.   III,IV, VI: She has a left gaze forced deviation Speech is hypophonic  She has right facial weakness Motor: She is flaccid on the right with triple flexion in the right lower extremity  Sensory: She does grimace to noxious stimulation bilaterally  States that she can not feel the right side of her face, but able to feel light touch in arms and legs bilaterally.  Cerebellar: Unable to perform      ASSESSMENT/PLAN [name] is a 76 y.o. 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 She did not pass SLP, will need cortrak.   Stroke:   Etiology:    Code Stroke CT head - No acute cortically based infarct or acute intracranial hemorrhage identified. ASPECTS 10. Stable non contrast CT  appearance of the brain, with chronic small vessel ischemia and chronic right hemisphere encephalomalacia, craniotomy. CTA head & neck 1. 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 Faint left PICA reconstituted enhancement. But otherwise stable and largely negative for age CTA Head and Neck; mild for age atherosclerosis and no other arterial stenosis or occlusion identified.   CT perfusion CT Perfusion detects no infarct core or ischemic penumbra.  MRI - Many small scattered acute or early subacute infarcts throughout the infratentorial and supratentorial brain, detailed above and probably embolic. Multifocal encephalomalacia. Evidence of prior subarachnoid hemorrhage. Carotid Doppler   2D Echo 70-75%  LDL 81 HgbA1c 6.1 VTE prophylaxis -  warfarin daily prior to admission, now on aspirin 81 mg daily and warfarin daily.  Therapy recommendations:  Pending Disposition:  Pending   Hypertension Home meds:  None Stable Permissive hypertension (OK if < 220/120) but gradually normalize in 5-7 days Long-term BP goal normotensive  Hyperlipidemia Home meds:  Atorvastatin, resumed in hospital LDL 81, goal < 70 Continue statin at discharge  Other Stroke Risk Factors Advanced Age >/= 42  Hx stroke/TIA 08/2019 - admitted for worsening left-sided weakness.  MRI showed tiny acute punctate stroke in bed of old infarct. unsure etiology; unusual pattern within the bed of the old infarct possibly failure of collaterals. She does have mechanical heart valve and is on long-term anticoagulation and INR was suboptimal at 2.1 on admission.  EF 55%, LDL 65, CT head and neck negative.  Continue on aspirin and Coumadin. 09/2017 -admitted for dizziness and unsteady gait.  Resolved on admission.  Considered BPPV most likely - typical symptoms despite neg Dix-Hallpike.  MRI negative.  CT head neck negative.  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  INR 3.2->2.9 Baseline INR stable at 3.0-3.5 most of the time Given current event, recommend INR goal 3.0-3.5  Hospital day # 1  Patient seen and examined by NP/APP with MD. MD to update note as needed.   Janine Ores, DNP, FNP-BC [facility] Pager: [phone]  ATTENDING ATTESTATION:  76 year old with multiple strokes embolic appearing with history of mechanical valve on Coumadin.  Will add aspirin 81 mg as she continues to have strokes on therapeutic Coumadin.  Goal INR should be in the high range from 2.5-3.  Exam as above.  This can increase the risk of bleeding and her husband is aware.  EEG shows slowing but no seizure.  Palliative care consulted.  Neurology will sign off.  Please call with questions.  Dr. Reeves Forth evaluated pt independently, reviewed imaging, chart, labs. Discussed and formulated plan with the Resident/APP. Changes were made to the note where appropriate. Please see APP/resident note above for details.   Raziyah Vanvleck,MD    To contact Stroke Continuity provider, please refer to [email or web address] After hours, contact General Neurology

## 2022-09-29 NOTE — ED Notes (Signed)
Patient denies pain and is resting comfortably.  

## 2022-09-29 NOTE — ED Notes (Addendum)
MD at bedside to update pt and husband.

## 2022-09-29 NOTE — ED Notes (Signed)
Pt alert and asking "when am I going to die", speech difficult to understand. Husband says she has been asking about dying. No acute distress at this time.

## 2022-09-29 NOTE — Progress Notes (Signed)
This chaplain responded to PMT NP-Amber consult for spiritual care in the context of major life transition and request for prayer.  The chaplain listened attentively and reflectively as the Pt. tearfully communicated her love and request for prayer for her husband-Robert. The chaplain practiced paraphrasing and checked for meaning to ensure effective communication with the Pt. The chaplain prayed with the Pt. and washed her face, both of which was appreciated by the Pt. The chaplain understands [name] will not visit tonight. With the Pt. permission, music was left playing in the room.  The Pt. accepted the chaplain's invitation for F/U spiritual care from another chaplain during the holidays.  Chaplain Sallyanne Kuster [phone]

## 2022-09-29 NOTE — Progress Notes (Signed)
Speech Language Pathology Treatment: Dysphagia  Patient Details Name: [name] MRN: [id number] DOB: 26-Nov-1945 Today's Date: 09/29/2022 Time: [id number] SLP Time Calculation (min) (ACUTE ONLY): 10 min  Assessment / Plan / Recommendation Clinical Impression  Pt demonstrated mild improvements in alertness and ability to transit po's from initial assessment yesterday. Decreased labial leakage with thin via teaspoon although continued signs of aspiration with immediate and prolonged cough. Mild delayed transit and observed swallow onset with puree without indications of airway intrusion. Pt needs instrumental assessment to fully assess oropharyngeal phase and recommendations for po. Plans are for pt to receive Cortrak today and recommend continue NPO until MBS can be performed.    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      SLP Plan  Continue with current plan of care;MBS (MBS Fri)      Recommendations for follow up therapy are one component of a multi-disciplinary discharge planning process, led by the attending physician.  Recommendations may be updated based on patient status, additional functional criteria and insurance authorization.    Recommendations  Diet recommendations: NPO Medication Administration: Via alternative means                Oral Care Recommendations: Oral care QID Follow Up Recommendations: Skilled nursing-short term rehab (<3 hours/day) Assistance recommended at discharge: Frequent or constant Supervision/Assistance SLP Visit Diagnosis: Dysphagia, oropharyngeal phase (R13.12) Plan: Continue with current plan of care;MBS (MBS Fri)           Houston Siren  09/29/2022, 11:10 AM

## 2022-09-29 NOTE — Evaluation (Signed)
Speech Language Pathology Evaluation Patient Details Name: [name] MRN: [id number] DOB: 04/12/1946 Today's Date: 09/29/2022 Time: 5027-7412 SLP Time Calculation (min) (ACUTE ONLY): 16 min  Problem List:  Patient Active Problem List   Diagnosis Date Noted   DNR (do not resuscitate) 09/22/2022   AMS (altered mental status) 08/28/2022   Constipation 08/28/2022   Gastritis 08/28/2022   Subarachnoid hemorrhage ([place]) 04/30/2022   SAH (subarachnoid hemorrhage) ([place]) 04/26/2022   Goals of care, counseling/discussion    Palliative care by specialist    Acute CVA (cerebrovascular accident) ([place]) 07/01/2020   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   Anticoagulated on warfarin    Left hemiparesis ([place])    History of CVA with residual deficit    Thrombocytopenia ([place])    Prediabetes    Vertigo 09/24/2017   Benign paroxysmal positional vertigo    History of stroke    HLD (hyperlipidemia) 08/23/2017   Encounter for therapeutic drug monitoring 08/08/2017   Long term (current) use of anticoagulants [Z79.01] 07/12/2017   Hemiparesis and alteration of sensations as late effects of stroke ([place]) 09/07/2016   Abnormality of gait 09/07/2016   HTN (hypertension) 04/13/2016   H/O mitral valve replacement with mechanical valve 10/16/2015   Diabetes mellitus without complication (HCC)    Hemiparesis (L sided - mild) due to old stroke [facility])    Past Medical History:  Past Medical History:  Diagnosis Date   Abnormality of gait 09/07/2016   Allergy    Diabetes mellitus without complication ([place])    Patient denies this - notes history of glucose intolerance   GERD (gastroesophageal reflux disease)    Hemiparesis and alteration of sensations as late effects of stroke ([place]) 09/07/2016   History of pneumonia 1997   Hypertension    S/P MVR (mitral valve replacement)    Mechanical mitral valve replacement at age 26 (done in [place])  // echo 7/17: EF 55-60, normal wall motion, bileaflet  mechanical mitral valve prosthesis functioning normally, mild LAE, mildly reduced RVSF, small pericardial effusion   Stroke ([place]) 1997, 2013, 2015   Past Surgical History:  Past Surgical History:  Procedure Laterality Date   ABDOMINAL HYSTERECTOMY     BRAIN SURGERY     BUNIONECTOMY  1993   CARDIAC VALVE REPLACEMENT  1997   TOE SURGERY  19989   HPI:  76 year old female with acute onset right hemiplegia with left gaze and relatively preserved language.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 Multiple strokes (1997, 2013, 2015, 2017). Has been in decline, per husband since 06/2022 and presently resides in SNF.   Assessment / Plan / Recommendation Clinical Impression  Pt's spouse reports cognitive decline since 06/2022 and is residing in a SNF. Baseline impairments in memory, orientation (day), awareness. Her dysarthria has worsened with this stroke affecting her vocal intensity, articulatory precision and overall intelligibility in phrases.She was oriented to her name and month only, followed 2 step command with 75% accuracy, recalled 1/4 words independently (1/4 correct with choice). Expressive language abilitites are within normal limits. Pt would benefit from continued therapy at facilitating speech intelligibility and cognition.    SLP Assessment  SLP Recommendation/Assessment: Patient needs continued Speech Lanaguage Pathology Services SLP Visit Diagnosis: Dysarthria and anarthria (R47.1);Cognitive communication deficit (R41.841)    Recommendations for follow up therapy are one component of a multi-disciplinary discharge planning process, led by the attending physician.  Recommendations may be updated based on patient status, additional functional criteria  and insurance authorization.    Follow Up Recommendations  Skilled nursing-short term rehab (<3 hours/day)    Assistance Recommended at Discharge  Frequent  or constant Supervision/Assistance  Functional Status Assessment Patient has had a recent decline in their functional status and demonstrates the ability to make significant improvements in function in a reasonable and predictable amount of time.  Frequency and Duration min 2x/week  2 weeks      SLP Evaluation Cognition  Overall Cognitive Status: History of cognitive impairments - at baseline Arousal/Alertness: Awake/alert Orientation Level: Oriented to person;Oriented to place;Disoriented to time (correct month) Year:  (1923) Month: November Day of Week:  (not oriented to day at baseline) Memory: Impaired Memory Impairment: Retrieval deficit;Storage deficit Awareness: Impaired Awareness Impairment: Intellectual impairment Problem Solving: Impaired Problem Solving Impairment: Verbal basic Safety/Judgment: Impaired       Comprehension  Auditory Comprehension Overall Auditory Comprehension: Impaired Commands: Impaired Two Step Basic Commands: 75-100% accurate (75%) Visual Recognition/Discrimination Discrimination: Not tested Reading Comprehension Reading Status: Not tested    Expression Expression Primary Mode of Expression: Verbal Verbal Expression Overall Verbal Expression: Appears within functional limits for tasks assessed Level of Generative/Spontaneous Verbalization: Phrase Naming: No impairment Written Expression Dominant Hand: Right Written Expression: Not tested   Oral / Motor  Oral Motor/Sensory Function Overall Oral Motor/Sensory Function: Severe impairment Facial ROM: Reduced right;Suspected CN VII (facial) dysfunction Facial Symmetry: Abnormal symmetry right;Suspected CN VII (facial) dysfunction Facial Strength: Reduced right;Suspected CN VII (facial) dysfunction Lingual ROM: Reduced right;Suspected CN XII (hypoglossal) dysfunction Lingual Strength: Reduced Motor Speech Overall Motor Speech: Impaired Phonation: Low vocal intensity Resonance: Within  functional limits Articulation: Impaired Level of Impairment: Phrase Intelligibility: Intelligibility reduced Motor Planning: Witnin functional limits            Houston Siren 09/29/2022, 12:25 PM

## 2022-09-29 NOTE — ED Notes (Signed)
Pt cleaned and brief changed

## 2022-09-29 NOTE — Progress Notes (Signed)
ANTICOAGULATION CONSULT NOTE  Pharmacy Consult for warfarin Indication: MVR  Allergies  Allergen Reactions   Zoloft [Sertraline] Hives, Itching and Rash    Patient Measurements: Height: '5\' 6"'$  (167.6 cm) Weight: 53.6 kg (118 lb 2.7 oz) IBW/kg (Calculated) : 59.3  Vital Signs: Temp: 97.8 F (36.6 C) (11/22 0400) Temp Source: Oral (11/22 0400) BP: 116/80 (11/22 0641) Pulse Rate: 91 (11/22 0641)  Labs: Recent Labs    09/11/2022 0926 09/30/2022 0931 09/29/22 0324  HGB 11.6* 12.9  --   HCT 35.6* 38.0  --   PLT 172  --   --   APTT 45*  --   --   LABPROT 40.7*  --  32.2*  INR 4.3*  --  3.2*  CREATININE 0.79 0.70  --      Estimated Creatinine Clearance: 50.6 mL/min (by C-G formula based on SCr of 0.7 mg/dL).   Medical History: Past Medical History:  Diagnosis Date   Abnormality of gait 09/07/2016   Allergy    Diabetes mellitus without complication [facility])    Patient denies this - notes history of glucose intolerance   GERD (gastroesophageal reflux disease)    Hemiparesis and alteration of sensations as late effects of stroke ([place]) 09/07/2016   History of pneumonia 1997   Hypertension    S/P MVR (mitral valve replacement)    Mechanical mitral valve replacement at age 76 (done in [place])  // echo 7/17: EF 55-60, normal wall motion, bileaflet mechanical mitral valve prosthesis functioning normally, mild LAE, mildly reduced RVSF, small pericardial effusion   Stroke ([place]) 1997, 2013, 2015    Assessment: 1 YOF presenting with stoke sx, hx of MVR on warfarin PTA with last dose 11/20.  INR on admission supratherapeutic at 4.3  Todays INR therapeutic at 3.2  PTA dosing: Alternating '10mg'$  with '5mg'$   Goal of Therapy:  INR 2.5-3.5 Monitor platelets by anticoagulation protocol: Yes   Plan:  Warfarin '10mg'$  PO x 1 today Daily INR, s/s bleeding  Bertis Ruddy, PharmD Clinical Pharmacist ED Pharmacist Phone # [phone] 09/29/2022 8:01 AM

## 2022-09-29 NOTE — Progress Notes (Signed)
Inpatient Rehab Admissions Coordinator:    Note CIR consult received and then discontinued. Pt. From [facility] SNF long term care. Recommend she return there for rehab.  Clemens Catholic, [place], [place] Admissions Coordinator  [phone] ([place]) [phone] (office)

## 2022-09-29 NOTE — ED Notes (Signed)
Patient requested some water. Tolerated well. Patient stated "I don't what time it is". RN provided info. No other needs or concerns expressed at this time.

## 2022-09-29 NOTE — Evaluation (Signed)
Physical Therapy Evaluation Patient Details Name: [name] MRN: [id number] DOB: 09-Feb-1946 Today's Date: 09/29/2022  History of Present Illness  76 yo female with onset of stroke-like symptoms was admitted on 11/21, now found to have many small scattered acute or early subacute infarcts throughout the infratentorial and supratentorial brain, probably embolic. PMHx:  encephalomalacia, subarachnoid hemorrhage overlying the left greater than right parietaoccipital lobes, post mechanical mitral valve replacement, hemiparesis, HTN, HLD, DM, PNA  Clinical Impression  Pt was seen for mobility on gurney with help to move esp as she is requiring a lot of help to steady sitting balance and avoid a fall.  Pt is a bit unfocused on the tasks, but can demonstrate ability to assist to stand.  However, HR was suddenly up dramatically to 180 with lowest value 133, causing PT to quickly return pt to bed.  Follow along with her to work on mobility to catch her up to previous levels in SNF and will anticipate her return to same SNF for further strengthening.  Encourage OOB in chair and standing as her system will tolerate.       Recommendations for follow up therapy are one component of a multi-disciplinary discharge planning process, led by the attending physician.  Recommendations may be updated based on patient status, additional functional criteria and insurance authorization.  Follow Up Recommendations Skilled nursing-short term rehab (<3 hours/day) Can patient physically be transported by private vehicle: No    Assistance Recommended at Discharge Frequent or constant Supervision/Assistance  Patient can return home with the following  A lot of help with walking and/or transfers;A lot of help with bathing/dressing/bathroom;Direct supervision/assist for medications management;Direct supervision/assist for financial management    Equipment Recommendations None recommended by PT  Recommendations for Other  Services       Functional Status Assessment Patient has had a recent decline in their functional status and demonstrates the ability to make significant improvements in function in a reasonable and predictable amount of time.     Precautions / Restrictions Precautions Precautions: Fall Precaution Comments: NPO, Watch HR Restrictions Weight Bearing Restrictions: No      Mobility  Bed Mobility Overal bed mobility: Needs Assistance Bed Mobility: Supine to Sit, Sit to Supine     Supine to sit: Max assist Sit to supine: Max assist   General bed mobility comments: total support as pt is fairly weak on R side, cannot control hip to support any bed mob    Transfers                   General transfer comment: deferred due to elevation of HR to 180    Ambulation/Gait               General Gait Details: deferred  Stairs            Wheelchair Mobility    Modified Rankin (Stroke Patients Only)       Balance Overall balance assessment: Needs assistance Sitting-balance support: Feet supported Sitting balance-Leahy Scale: Poor                                       Pertinent Vitals/Pain Pain Assessment Pain Assessment: Faces Faces Pain Scale: No hurt    Home Living Family/patient expects to be discharged to:: Skilled nursing facility   Available Help at Discharge: [place] Type of Home: [place]  Additional Comments: [facility] since 05/2022    Prior Function Prior Level of Function : Needs assist       Physical Assist : Mobility (physical) Mobility (physical): Gait   Mobility Comments: has RW assist to walk at SNF ADLs Comments: 24/7 SNF staff     Hand Dominance   Dominant Hand: Right    Extremity/Trunk Assessment   Upper Extremity Assessment Upper Extremity Assessment: Defer to OT evaluation RUE Deficits / Details: Mixed tone noted.  Hand in flexed posture but able  to move out of it.  Fair grip, and good tricep and bicep activation.  Increased weakness to her shoulder.  Able to reach for her nose with over/under shooting noted. RUE Coordination: decreased fine motor;decreased gross motor LUE Deficits / Details: MMT grossly 3+/5 LUE Sensation: WNL LUE Coordination: WNL    Lower Extremity Assessment Lower Extremity Assessment: Generalized weakness    Cervical / Trunk Assessment Cervical / Trunk Assessment: Kyphotic  Communication   Communication: Receptive difficulties;Expressive difficulties  Cognition Arousal/Alertness: Awake/alert Behavior During Therapy: Anxious Overall Cognitive Status: History of cognitive impairments - at baseline                                 General Comments: requires repetitive instructions for mobility and very weak        General Comments General comments (skin integrity, edema, etc.): Pt sat up with PT assist on side of gurney and was unable to get farther due to HR suddenly elevating up to 180 then not below 133 until her  posture was returned to supine    Exercises     Assessment/Plan    PT Assessment Patient needs continued PT services  PT Problem List Decreased strength;Decreased activity tolerance;Decreased balance;Decreased mobility;Decreased coordination;Decreased cognition;Decreased safety awareness;Cardiopulmonary status limiting activity       PT Treatment Interventions DME instruction;Gait training;Functional mobility training;Therapeutic activities;Therapeutic exercise;Balance training;Neuromuscular re-education;Patient/family education    PT Goals (Current goals can be found in the Care Plan section)  Acute Rehab PT Goals Patient Stated Goal: unable to state PT Goal Formulation: With patient Time For Goal Achievement: 10/13/22 Potential to Achieve Goals: Good    Frequency Min 3X/week     Co-evaluation               AM-PAC PT "6 Clicks" Mobility  Outcome Measure  Help needed turning from your back to your side while in a flat bed without using bedrails?: A Lot Help needed moving from lying on your back to sitting on the side of a flat bed without using bedrails?: A Lot Help needed moving to and from a bed to a chair (including a wheelchair)?: A Lot Help needed standing up from a chair using your arms (e.g., wheelchair or bedside chair)?: A Lot Help needed to walk in hospital room?: Total Help needed climbing 3-5 steps with a railing? : Total 6 Click Score: 10    End of Session Equipment Utilized During Treatment: Gait belt Activity Tolerance: Treatment limited secondary to medical complications (Comment) Patient left: in bed;with call bell/phone within reach Nurse Communication: Mobility status;Other (comment) (shared elevation of HR) PT Visit Diagnosis: Muscle weakness (generalized) (M62.81);Other abnormalities of gait and mobility (R26.89);Difficulty in walking, not elsewhere classified (R26.2)    Time: 1207-1222 PT Time Calculation (min) (ACUTE ONLY): 15 min   Charges:   PT Evaluation $PT Eval Moderate Complexity: 1 Mod  Ramond Dial 09/29/2022, 4:13 PM  Mee Hives, PT PhD Acute Rehab Dept. Number: [place] and [place]

## 2022-09-29 NOTE — Evaluation (Signed)
Occupational Therapy Evaluation Patient Details Name: [name] MRN: [id number] DOB: Nov 18, 1945 Today's Date: 09/29/2022   History of Present Illness 76 yo with h/o multiple strokes adm 6/19 to [facility] health with confusion and speech deficits.    Per MD notes "She was to have injection (for a ) hip problem and is scheduled to have an injection this coming Thursday.  Her Coumadin has been held since Friday.  Yesterday, patient was started on Lovenox injections 1 mg/kilogram twice daily, she received 2 injections total yesterday at 8 AM and 8 PM by her husband.  She went to sleep and woke up this morning and was found to be confused and had slurred speech."  Imaging showed SAH overlying the left greater than right parietaoccipital lobes, overall moderate in volume.  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  EXB:MWUX small scattered acute or early subacute infarcts throughout  the infratentorial and supratentorial brain, detailed above and  probably embolic.  2. Multifocal encephalomalacia.  3. Evidence of prior subarachnoid hemorrhage   Clinical Impression   Patient admitted for the diagnosis above.  PTA she was residing in a local SNF.  Receptive difficulties make it difficult to truly understand her prior level, but per notes, assist as needed for ADL, and wheelchair level.  Deficits impacting independence are listed below.  Currently she is NPO, and needing Max A for basic mobility and ADL completion from bed level.  Patient is very tearful about her current status, and wants to be able to walk again.  Of note, HR to 203 bpm with supine to sit.  Sit to stand abandoned, and patient returned to supine.  OT to follow in the acute setting with a return to SNF for rehab trial recommended.           Recommendations for follow up therapy are one component of a multi-disciplinary discharge planning process, led by the attending physician.   Recommendations may be updated based on patient status, additional functional criteria and insurance authorization.   Follow Up Recommendations  Skilled nursing-short term rehab (<3 hours/day)     Assistance Recommended at Discharge Frequent or constant Supervision/Assistance  Patient can return home with the following A lot of help with bathing/dressing/bathroom;Two people to help with walking and/or transfers;Assist for transportation;Assistance with feeding    Functional Status Assessment  Patient has had a recent decline in their functional status and demonstrates the ability to make significant improvements in function in a reasonable and predictable amount of time.  Equipment Recommendations  None recommended by OT    Recommendations for Other Services       Precautions / Restrictions Precautions Precautions: Fall;Other (comment) Precaution Comments: NPO, Watch HR Restrictions Weight Bearing Restrictions: No      Mobility Bed Mobility Overal bed mobility: Needs Assistance Bed Mobility: Supine to Sit, Sit to Supine     Supine to sit: Mod assist, Max assist Sit to supine: Max assist   General bed mobility comments: assist with legs and trunk    Transfers Overall transfer level: Needs assistance                 General transfer comment: Stand deferred, patient's HR to 203 with V tach.  Returned to supine.      Balance Overall balance assessment: Needs assistance Sitting-balance support: Feet unsupported, Single extremity supported Sitting balance-Leahy Scale: Poor   Postural control: Posterior lean  ADL either performed or assessed with clinical judgement   ADL Overall ADL's : Needs assistance/impaired Eating/Feeding: NPO   Grooming: Minimal assistance;Bed level   Upper Body Bathing: Moderate assistance;Bed level   Lower Body Bathing: Maximal assistance;Bed level   Upper Body Dressing : Moderate  assistance;Bed level   Lower Body Dressing: Maximal assistance;Bed level                       Vision   Vision Assessment?: Vision impaired- to be further tested in functional context Additional Comments: Initial head turn to L, but able to look and turn her head to the right when cued.  Possible R visual field cut.     Perception     Praxis      Pertinent Vitals/Pain Pain Assessment Pain Assessment: No/denies pain     Hand Dominance Right   Extremity/Trunk Assessment Upper Extremity Assessment Upper Extremity Assessment: LUE deficits/detail;RUE deficits/detail RUE Deficits / Details: Mixed tone noted.  Hand in flexed posture but able to move out of it.  Fair grip, and good tricep and bicep activation.  Increased weakness to her shoulder.  Able to reach for her nose with over/under shooting noted. RUE Coordination: decreased fine motor;decreased gross motor LUE Deficits / Details: MMT grossly 3+/5 LUE Sensation: WNL LUE Coordination: WNL       Cervical / Trunk Assessment Cervical / Trunk Assessment: Kyphotic   Communication Communication Communication: Receptive difficulties;Expressive difficulties   Cognition Arousal/Alertness: Awake/alert Behavior During Therapy: Anxious Overall Cognitive Status: History of cognitive impairments - at baseline                                 General Comments: follows one step commands well.  Oriented to month, year and self.     General Comments       Exercises     Shoulder Instructions      Home Living Family/patient expects to be discharged to:: Skilled nursing facility   Available Help at Discharge: [place] Type of Home: [place]                           Additional Comments: [facility] SN[facility] since 05/2022  Lives With:  (SNF)    Prior Functioning/Environment Prior Level of Function : Needs assist               ADLs Comments: Assist as needed  from facility staff.        OT Problem List: Decreased strength;Decreased range of motion;Decreased activity tolerance;Impaired balance (sitting and/or standing);Impaired vision/perception;Impaired UE functional use;Decreased safety awareness;Decreased cognition;Decreased coordination      OT Treatment/Interventions: Self-care/ADL training;Therapeutic activities;Therapeutic exercise;Neuromuscular education;Visual/perceptual remediation/compensation;Patient/family education;DME and/or AE instruction;Balance training    OT Goals(Current goals can be found in the care plan section) Acute Rehab OT Goals Patient Stated Goal: Walk OT Goal Formulation: With patient Time For Goal Achievement: 10/13/22 Potential to Achieve Goals: Good ADL Goals Pt Will Perform Grooming: with min assist;sitting Pt Will Perform Upper Body Bathing: with min guard assist;sitting Pt Will Perform Upper Body Dressing: with min assist;sitting Pt Will Transfer to Toilet: with min assist;stand pivot transfer;bedside commode Pt/caregiver will Perform Home Exercise Program: Increased strength;Both right and left upper extremity;With minimal assist  OT Frequency: Min 2X/week    Co-evaluation              AM-PAC OT "  6 Clicks" Daily Activity     Outcome Measure Help from another person eating meals?: Total Help from another person taking care of personal grooming?: A Little Help from another person toileting, which includes using toliet, bedpan, or urinal?: A Lot Help from another person bathing (including washing, rinsing, drying)?: A Lot Help from another person to put on and taking off regular upper body clothing?: A Lot Help from another person to put on and taking off regular lower body clothing?: A Lot 6 Click Score: 12   End of Session Nurse Communication: Mobility status  Activity Tolerance: Other (comment) (HR >200) Patient left: in bed;with call bell/phone within reach  OT Visit Diagnosis: Unsteadiness  on feet (R26.81);Muscle weakness (generalized) (M62.81);Ataxia, unspecified (R27.0);Other symptoms and signs involving cognitive function;Hemiplegia and hemiparesis Hemiplegia - Right/Left: Right Hemiplegia - dominant/non-dominant: Dominant Hemiplegia - caused by: Cerebral infarction                Time: 1241-1305 OT Time Calculation (min): 24 min Charges:  OT General Charges $OT Visit: 1 Visit OT Evaluation $OT Eval Moderate Complexity: 1 Mod OT Treatments $Therapeutic Activity: 8-22 mins  09/29/2022  RP, OTR/L  Acute Rehabilitation Services  Office:  [phone]   Metta Clines 09/29/2022, 1:31 PM

## 2022-09-29 NOTE — Progress Notes (Signed)
Daily Progress Note   [name] Name: [name]       Date: 09/29/2022 DOB: 01/17/1946  Age: 76 y.o. MRN#: [id number] Attending Physician: Bonnielee Haff, MD Primary Care Physician: Haywood Pao, MD Admit Date: 09/15/2022  Reason for Consultation/Follow-up: Establishing goals of care  Subjective: Chart review performed. Received report from primary RN - no acute concerns. RN states [name] continues to have poor swallowing function, slurred speech, had difficulty standing with OT. RN tells me [name] stated this morning "When can I die?"   Went to visit [name] at bedside - no family/visitors present. [name] was lying in bed awake, alert, oriented, and able to participate in conversation; however, [name] speech is slurred and difficult to understand at times. No signs or non-verbal gestures of pain or discomfort noted. No respiratory distress, increased work of breathing, or secretions noted. [name] denies pain. Allowed space and time for [name] to respond appropriately to questions and express thoughts/wishes.  Emotional support provided to [name]. [name] understands [name] was admitted with an acute stroke but does not remember the events of yesterday - this is clearly distressing for [name]. [name] asks "How did I get here (to hospital)?" Reviewed events from [facility] staff finding [name] with right sided weakness/facial droop with concern for stroke, calling EMS, and EMS transporting [name] to ED. [name] is understandably tearful throughout conversation finding it difficult that [name] doesn't remember. I ask [name] how [name] felt about being brought to the hospital, [name] tells me "I am glad I came to the hospital."   I inform the [name] the nurse notified me of a comment this morning about wanting to die and ask  [name] feelings around this. [name] tells me "I didn't say I wanted to die, I asked if I had died." After clarifying [name] question, [name] wonders if [name] died yesterday and was revived. I reviewed with [name] that [name] heart never stopped, [name] lungs never stopped, and CPR was not performed. Reviewed [name] DNR/DNI status, which [name] confirms. [name] is again emotional around trying to remember and piece together the events. Validated that it can be common for people not to remember events that happened around a serious/life threatening incident. [name] expresses appreciation for clarifying [name] questions.   I reviewed the [place] conversation had with [name] husband/Robert yesterday in detail. Counseled on the difference  between pursuing aggressive interventions vs transition to comfort/hospice. [name] is agreeable to trial period of coretrak if indicated and MBS for additional information. [name] is not certain [name] would want [name] life prolonged with PEG, but is open to ongoing [place] pending [name] clinical course.  Chaplain support offered - [name] agreeable. [name] is of [place] faith - would appreciate prayer and emotional support.   [name] is ok that I call and provide updates to [name] husband.  Called [name]'s husband/Robert - emotional support provided. Provided updates from conversation as outlined above - he expresses appreciation. At this time he states "whatever they can do to correct the problem" they want it explored but he also wants to be realistic and support [name] wishes. He explains that he has a very hard time understanding what [name] is saying, which is likely a difficult barrier for them both at this time. He is open to PT/OT continuing to work with [name].   All questions and concerns addressed. Encouraged to call with questions and/or concerns. PMT card previously provided.  Length of Stay: 1  Current Medications: Scheduled Meds:    stroke: early stages of recovery book   Does not apply Once   aspirin  300 mg  Rectal Daily   Or   aspirin  325 mg Oral Daily   atorvastatin  40 mg Oral Daily   warfarin  10 mg Oral ONCE-1600   Warfarin - Pharmacist Dosing Inpatient   Does not apply q1600    Continuous Infusions:  sodium chloride 50 mL/hr at 09/29/22 2409   feeding supplement (OSMOLITE 1.2 CAL)      PRN Meds: acetaminophen **OR** acetaminophen (TYLENOL) oral liquid 160 mg/5 mL **OR** acetaminophen, bisacodyl  Physical Exam Vitals and nursing note reviewed.  Constitutional:      General: [name] is not in acute distress.    Appearance: [name] is ill-appearing.  Pulmonary:     Effort: No respiratory distress.  Skin:    General: Skin is warm and dry.  Neurological:     Mental Status: [name] is alert and oriented to person, place, and time.     Motor: Weakness present.  Psychiatric:        Attention and Perception: Attention normal.        Mood and Affect: Mood is anxious. Affect is tearful.        Speech: Speech is delayed and slurred.        Behavior: Behavior is cooperative.        Cognition and Memory: Cognition and memory normal.             Vital Signs: BP 133/77   Pulse 83   Temp 98.8 F (37.1 C)   Resp (!) 23   Ht '5\' 6"'$  (1.676 m)   Wt 53.6 kg   SpO2 97%   BMI 19.07 kg/m  SpO2: SpO2: 97 % O2 Device: O2 Device: Room Air O2 Flow Rate:    Intake/output summary:  Intake/Output Summary (Last 24 hours) at 09/29/2022 1306 Last data filed at 09/29/2022 7353 Gross per 24 hour  Intake 439.17 ml  Output --  Net 439.17 ml   LBM: Last BM Date : 09/29/22 Baseline Weight: Weight: 53.6 kg Most recent weight: Weight: 53.6 kg       Palliative Assessment/Data: PPS 10% due to NPO status      [name] Active Problem List   Diagnosis Date Noted   DNR (do not resuscitate) 09/22/2022   AMS (altered mental status) 08/28/2022  Constipation 08/28/2022   Gastritis 08/28/2022   Subarachnoid hemorrhage ([place]) 04/30/2022   SAH (subarachnoid hemorrhage) ([place]) 04/26/2022   Goals of care,  counseling/discussion    Palliative care by specialist    Acute CVA (cerebrovascular accident) ([place]) 07/01/2020   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   Anticoagulated on warfarin    Left hemiparesis ([place])    History of CVA with residual deficit    Thrombocytopenia ([place])    Prediabetes    Vertigo 09/24/2017   Benign paroxysmal positional vertigo    History of stroke    HLD (hyperlipidemia) 08/23/2017   Encounter for therapeutic drug monitoring 08/08/2017   Long term (current) use of anticoagulants [Z79.01] 07/12/2017   Hemiparesis and alteration of sensations as late effects of stroke ([place]) 09/07/2016   Abnormality of gait 09/07/2016   HTN (hypertension) 04/13/2016   H/O mitral valve replacement with mechanical valve 10/16/2015   Diabetes mellitus without complication (HCC)    Hemiparesis (L sided - mild) due to old stroke [facility])     Palliative Care Assessment & Plan   [name] Profile: 76 y.o. female  with past medical history of  DM, multiple CVAs with hemiparesis, MVR on Coumadin, and HTN presented to ED on 09/21/2022 from [facility] with stroke-like symptoms - left sided gaze, right sided weakness. [name] was admitted on 09/14/2022 with acute CVA.    Of note, [name] has had 3 hospital admissions in the last 6 months and is a 30 day readmission.  Assessment: Principal Problem:   Acute CVA (cerebrovascular accident) [facility]) Active Problems:   Diabetes mellitus without complication ([place])   H/O mitral valve replacement with mechanical valve   HTN (hypertension)   HLD (hyperlipidemia)   DNR (do not resuscitate)   Recommendations/Plan: Continue full scope and allow time for outcomes DNR/DNI confirmed [name] is also agreeable for trial period of coretrak if indicated and MBS; [name] is not certain [name] would want [name] life prolonged with PEG [name]/spouse are open to ongoing [place] pending [name] clinical course PMT will follow-up on Friday 11/24. If there are any imminent needs  please call the service directly  Goals of Care and Additional Recommendations: Limitations on Scope of Treatment: Full Scope Treatment and No Tracheostomy  Code Status:    Code Status Orders  (From admission, onward)           Start     Ordered   09/15/2022 1152  Do not attempt resuscitation (DNR)  Continuous       Question Answer Comment  In the event of cardiac or respiratory ARREST Do not call a "code blue"   In the event of cardiac or respiratory ARREST Do not perform Intubation, CPR, defibrillation or ACLS   In the event of cardiac or respiratory ARREST Use medication by any route, position, wound care, and other measures to relive pain and suffering. May use oxygen, suction and manual treatment of airway obstruction as needed for comfort.      10/04/2022 1153           Code Status History     Date Active Date Inactive Code Status Order ID Comments User Context   08/28/2022 0136 08/30/2022 2248 DNR [id number]  Ileene Musa T, DO ED   04/30/2022 1614 06/01/2022 1519 Full Code [id number]  Cathlyn Parsons, PA-C Inpatient   04/30/2022 1614 04/30/2022 1614 Full Code [id number]  Cathlyn Parsons, PA-C Inpatient   04/26/2022 1535 04/30/2022 1611 Full Code [id number]  Lequita Halt, MD ED   07/04/2020  1511 07/16/2020 1649 Full Code [id number]  Elizabeth Sauer Inpatient   07/04/2020 1511 07/04/2020 1511 Full Code [id number]  Elizabeth Sauer Inpatient   07/02/2020 0253 07/04/2020 1503 Full Code [id number]  Chotiner, Yevonne Aline, MD ED   09/05/2019 1625 09/15/2019 1404 Full Code [id number]  Cathlyn Parsons, PA-C Inpatient   09/05/2019 1625 09/05/2019 1625 Full Code [id number]  Elizabeth Sauer Inpatient   09/03/2019 0932 09/05/2019 1612 Full Code [id number]  Norval Morton, MD ED   09/24/2017 0352 09/24/2017 2226 Full Code [id number]  Etta Quill, DO ED       Prognosis:  Unable to determine  Discharge Planning: To Be Determined  Care plan was discussed with  primary RN, [name], [name]'s spouse  Thank you for allowing the Palliative Medicine Team to assist in the care of this [name].   Total Time 60 minutes Prolonged Time Billed  no       Greater than 50%  of this time was spent counseling and coordinating care related to the above assessment and plan.  Lin Landsman, NP  Please contact Palliative Medicine Team phone at [phone] for questions and concerns.   *Portions of this note are a verbal dictation therefore any spelling and/or grammatical errors are due to the "[place] [employer]" system interpretation.

## 2022-09-29 NOTE — Progress Notes (Signed)
[facility] PROGRESS NOTE   [name] CHY:[id number] DOB: 06/13/1946 DOA: 10/03/2022  PCP: Haywood Pao, MD  Brief History/Interval Summary: 76 y.o. 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      Consultants: Neurology  Procedures: Transthoracic echocardiogram    Subjective/Interval History: Patient was dysarthria.  Noted to be slightly confused/distracted.  Husband is at the bedside.    Assessment/Plan:  Acute stroke MRI raises concern for embolic stroke. Patient was already on warfarin.  Was not given TPA. Patient started on aspirin. Seen by neurology.  Echocardiogram shows normal systolic function.  Moderate pericardial effusion was noted. LDL is 81.  HbA1c is pending. Patient started on statin once a feeding tube has been placed.  Oropharyngeal dysphagia Seen by speech therapy.  Not cleared for oral intake.  Core track orders have been placed.  Moderate pericardial effusion Noted on echocardiogram.  No tamponade physiology.  Previous echocardiogram from 2021 showed small pericardial effusion.  Will check TSH. Patient followed by Dr. Acie Fredrickson with cardiology. Depending on how patient does over the next 1 to 2 weeks it may be reasonable for her to follow-up with her cardiologist for repeat echocardiogram in a few weeks time.  Essential hypertension Allowing permissive hypertension.  Amlodipine on hold.  Hyperlipidemia Resume statin once feeding tube has been placed with  History of mitral valve replacement On warfarin prior to admission.  INR 3.2.  Resume warfarin once a feeding tube has been placed.  Diabetes mellitus type 2 Last HbA1c was 6.1.  HbA1c is pending from this admission.  Not on any glucose lowering agents.  DVT Prophylaxis: On warfarin Code Status: DNR Family Communication: Discussed with patient's  husband Disposition Plan: To be determined  Status is: Inpatient Remains inpatient appropriate because: Acute stroke      Medications: Scheduled:   stroke: early stages of recovery book   Does not apply Once   aspirin  300 mg Rectal Daily   Or   aspirin  325 mg Oral Daily   atorvastatin  40 mg Oral Daily   warfarin  10 mg Oral ONCE-1600   Warfarin - Pharmacist Dosing Inpatient   Does not apply q1600   Continuous:  sodium chloride 50 mL/hr at 09/29/22 8786   feeding supplement (OSMOLITE 1.2 CAL)     VEH:[id number] **OR** acetaminophen (TYLENOL) oral liquid 160 mg/5 mL **OR** acetaminophen, bisacodyl  Antibiotics: Anti-infectives (From admission, onward)    None       Objective:  Vital Signs  Vitals:   09/29/22 0400 09/29/22 0641 09/29/22 0800 09/29/22 0851  BP: (!) 143/73 116/80 120/79   Pulse: 83 91 89   Resp: '20 17 17   '$ Temp: 97.8 F (36.6 C)   98.9 F (37.2 C)  TempSrc: Oral   Axillary  SpO2: 97% 97% 97%   Weight:      Height:        Intake/Output Summary (Last 24 hours) at 09/29/2022 0919 Last data filed at 09/29/2022 2947 Gross per 24 hour  Intake 439.17 ml  Output --  Net 439.17 ml   Filed Weights   10/02/2022 0900  Weight: 53.6 kg    General appearance: Awake alert.  In no distress.  Noted to be distracted. Resp: Clear to auscultation bilaterally.  Normal effort Cardio: S1-S2 is normal regular.  No S3-S4.  Systolic murmur appreciated over the precordium. GI: Abdomen is soft.  Nontender  nondistended.  Bowel sounds are present normal.  No masses organomegaly Extremities: No edema.   Neurologic: Noted to be confused.  Moving all extremities.  Right-sided weakness noted.   Lab Results:  Data Reviewed: I have personally reviewed following labs and reports of the imaging studies  CBC: Recent Labs  Lab 09/27/2022 0926 09/22/2022 0931  WBC 5.4  --   NEUTROABS 3.9  --   HGB 11.6* 12.9  HCT 35.6* 38.0  MCV 83.0  --   PLT 172  --      Basic Metabolic Panel: Recent Labs  Lab 10/03/2022 0926 09/21/2022 0931  NA 137 139  K 3.8 4.1  CL 101 102  CO2 27  --   GLUCOSE 146* 157*  BUN 21 25*  CREATININE 0.79 0.70  CALCIUM 9.0  --     GFR: Estimated Creatinine Clearance: 50.6 mL/min (by C-G formula based on SCr of 0.7 mg/dL).  Liver Function Tests: Recent Labs  Lab 09/29/2022 0926  AST 16  ALT 12  ALKPHOS 78  BILITOT 0.2*  PROT 5.7*  ALBUMIN 3.1*     Coagulation Profile: Recent Labs  Lab 09/13/2022 0926 09/29/22 0324  INR 4.3* 3.2*    CBG: Recent Labs  Lab 09/20/2022 0927  GLUCAP 147*    Lipid Profile: Recent Labs    09/29/22 0324  CHOL 153  HDL 53  LDLCALC 81  TRIG 97  CHOLHDL 2.9     Radiology Studies: ECHOCARDIOGRAM COMPLETE  Result Date: 10/04/2022    ECHOCARDIOGRAM REPORT   Patient Name:   [name] Date of Exam: 09/09/2022 Medical Rec #:  [id number]      Height:       66.0 in Accession #:    [id number]     Weight:       118.2 lb Date of Birth:  08/18/1946      BSA:          1.599 m Patient Age:    64 years       BP:           117/84 mmHg Patient Gender: F              HR:           111 bpm. Exam Location:  Inpatient Procedure: 2D Echo Indications:    stroke  History:        Patient has prior history of Echocardiogram examinations, most                 recent 07/02/2020. Stroke, MV replacement; Risk Factors:Diabetes                 and Hypertension.  Sonographer:    Harvie Junior Referring Phys: [facility]  Sonographer Comments: Technically difficult study due to poor echo windows. IMPRESSIONS  1. Left ventricular ejection fraction, by estimation, is 70 to 75%. The left ventricle has hyperdynamic function. The left ventricle demonstrates regional wall motion abnormalities (abnormal septal motion). Left ventricular diastolic parameters are indeterminate.  2. Right ventricular systolic function is hyperdynamic. The right ventricular size is not well visualized. There is normal  pulmonary artery systolic pressure.  3. Moderate pericardial effusion, worst at LV posterior. The pericardial effusion is circumferential and posterior to the left ventricle. There is no mitral valve inflow variation seen. There is no RV collapse. There is no IVC plethora  4. The mitral valve has been replaced. No evidence of mitral valve regurgitation. Study is inadequate to view  prosthetic function: significant tachycardia during exam (up to at least 155 bpm). This with off axis imaging makes this study technically difficult. The mean gradient of 4 despite tachycardia is encouraging of the valve function  5. Tricuspid valve regurgitation is moderate to severe.  6. The aortic valve was not well visualized. Aortic valve regurgitation is not visualized. Conclusion(s)/Recommendation(s): Consider reimaging after heart rate control. FINDINGS  Left Ventricle: Left ventricular ejection fraction, by estimation, is 70 to 75%. The left ventricle has hyperdynamic function. The left ventricle demonstrates regional wall motion abnormalities. The left ventricular internal cavity size was small. There  is no left ventricular hypertrophy. Left ventricular diastolic parameters are indeterminate.  LV Wall Scoring: The anterior septum, mid inferoseptal segment, and basal inferoseptal segment are hypokinetic. The basal inferolateral segment is normal. Right Ventricle: The right ventricular size is not well visualized. Right vetricular wall thickness was not well visualized. Right ventricular systolic function is hyperdynamic. There is normal pulmonary artery systolic pressure. The tricuspid regurgitant velocity is 2.74 m/s, and with an assumed right atrial pressure of 3 mmHg, the estimated right ventricular systolic pressure is 38.4 mmHg. Left Atrium: Left atrial size was not well visualized. Right Atrium: Right atrial size was not well visualized. Pericardium: A moderately sized pericardial effusion is present. The pericardial  effusion is circumferential and posterior to the left ventricle. Mitral Valve: The mitral valve has been repaired/replaced. No evidence of mitral valve regurgitation. MV peak gradient, 13.1 mmHg. The mean mitral valve gradient is 4.0 mmHg. Tricuspid Valve: The tricuspid valve is normal in structure. Tricuspid valve regurgitation is moderate to severe. Aortic Valve: The aortic valve was not well visualized. Aortic valve regurgitation is not visualized. Pulmonic Valve: The pulmonic valve was not well visualized. Pulmonic valve regurgitation is not visualized. Aorta: The aortic root and ascending aorta are structurally normal, with no evidence of dilitation. IAS/Shunts: The interatrial septum was not well visualized.  LEFT VENTRICLE PLAX 2D LVIDd:         3.20 cm     Diastology LVIDs:         2.40 cm     LV e' medial:    11.60 cm/s LV PW:         0.90 cm     LV E/e' medial:  17.2 LV IVS:        1.10 cm     LV e' lateral:   13.60 cm/s LVOT diam:     1.80 cm     LV E/e' lateral: 14.6 LV SV:         24 LV SV Index:   15 LVOT Area:     2.54 cm  LV Volumes (MOD) LV vol d, MOD A4C: 64.8 ml LV vol s, MOD A4C: 28.8 ml LV SV MOD A4C:     64.8 ml RIGHT VENTRICLE RV Basal diam:  3.20 cm RV Mid diam:    2.70 cm RV S prime:     7.94 cm/s TAPSE (M-mode): 1.2 cm LEFT ATRIUM           Index        RIGHT ATRIUM           Index LA diam:      2.60 cm 1.63 cm/m   RA Area:     11.90 cm LA Vol (A4C): 24.5 ml 15.32 ml/m  RA Volume:   29.00 ml  18.14 ml/m  AORTIC VALVE             PULMONIC  VALVE LVOT Vmax:   86.70 cm/s  PV Vmax:       0.87 m/s LVOT Vmean:  55.200 cm/s PV Peak grad:  3.0 mmHg LVOT VTI:    0.096 m  AORTA Ao Root diam: 3.70 cm Ao Asc diam:  3.60 cm MITRAL VALVE                TRICUSPID VALVE MV Area (PHT): 5.62 cm     TR Peak grad:   30.0 mmHg MV Area VTI:   0.87 cm     TR Vmax:        274.00 cm/s MV Peak grad:  13.1 mmHg MV Mean grad:  4.0 mmHg     SHUNTS MV Vmax:       1.81 m/s     Systemic VTI:  0.10 m MV Vmean:       78.7 cm/s    Systemic Diam: 1.80 cm MV Decel Time: 135 msec MV E velocity: 199.00 cm/s MV A velocity: 50.70 cm/s MV E/A ratio:  3.93 Rudean Haskell MD Electronically signed by Rudean Haskell MD Signature Date/Time: 09/29/2022/6:07:59 PM    Final    MR BRAIN WO CONTRAST  Result Date: 09/08/2022 CLINICAL DATA:  L Stroke, follow up EXAM: MRI HEAD WITHOUT CONTRAST TECHNIQUE: Multiplanar, multiecho pulse sequences of the brain and surrounding structures were obtained without intravenous contrast. COMPARISON:  Same day CT head.  MRI head December 28, 2021. FINDINGS: Brain: Multiple acute or early subacute infarcts involving the paramedian right cerebellum, left pons, bilateral hippocampi, left basal ganglia, left posterior limb of the internal capsule and scattered throughout the cerebral cortex bilaterally. No evidence of acute hemorrhage, mass lesion, midline shift or hydrocephalus. Chronic areas of posterior frontal, right parietal and to a lesser extent the left temporal/occipital junction encephalomalacia. Multiple remote cerebellar infarcts. Patchy T2/FLAIR hyperintensities the white matter, compatible with carotid leak [place] vascular ischemic disease. Superficial siderosis along the cerebral convexities bilaterally, compatible with prior subarachnoid hemorrhage. Vascular: Evaluated on same day CTA. Skull and upper cervical spine: Right-sided parietal craniotomy. Otherwise, normal marrow signal. Sinuses/Orbits: Negative. Other: No sizable mastoid effusions. IMPRESSION: 1. Many small scattered acute or early subacute infarcts throughout the infratentorial and supratentorial brain, detailed above and probably embolic. 2. Multifocal encephalomalacia. 3. Evidence of prior subarachnoid hemorrhage. Electronically Signed   By: Margaretha Sheffield M.D.   On: 10/04/2022 15:08   EEG adult  Result Date: 09/27/2022 Lora Havens, MD     09/17/2022 11:23 AM Patient Name: [name] MRN: [id number]  Epilepsy Attending: Lora Havens Referring Physician/Provider: Greta Doom, MD Date: 09/26/2022 Duration: 22.05 mins Patient history: 76 year old female presented with forced left gaze, right sided weakness/sensory deficits.  EEG to evaluate for seizure. Level of alertness:  lethargic AEDs during EEG study: None Technical aspects: This EEG study was done with scalp electrodes positioned according to the 10-20 International system of electrode placement. Electrical activity was reviewed with band pass filter of 1-'70Hz'$ , sensitivity of 7 uV/mm, display speed of 53m/sec with a '60Hz'$  notched filter applied as appropriate. EEG data were recorded continuously and digitally stored.  Video monitoring was available and reviewed as appropriate. Description: EEG showed continuous low amplitude 2 to 3 Hz delta slowing in left hemisphere.  Additionally there was 5 to 8 Hz theta-alpha activity admixed with 12 to 15 Hz beta activity in right hemisphere.  Hyperventilation and photic stimulation were not performed.   ABNORMALITY -Continuous slow, generalized and lateralized left hemisphere IMPRESSION: This study  is suggestive of cortical dysfunction in left hemisphere, likely secondary to underlying structural abnormality, postictal state.  Additionally there was moderate diffuse encephalopathy, nonspecific etiology.  No seizures or epileptiform discharges were seen throughout the recording. Priyanka Barbra Sarks   CT ANGIO HEAD NECK W WO CM W PERF (CODE STROKE)  Result Date: 09/29/2022 CLINICAL DATA:  76 year old female code stroke presentation with leftward gaze, aphasia. EXAM: CT ANGIOGRAPHY HEAD AND NECK CT PERFUSION BRAIN TECHNIQUE: Multidetector CT imaging of the head and neck was performed using the standard protocol during bolus administration of intravenous contrast. Multiplanar CT image reconstructions and MIPs were obtained to evaluate the vascular anatomy. Carotid stenosis measurements (when applicable) are  obtained utilizing NASCET criteria, using the distal internal carotid diameter as the denominator. Multiphase CT imaging of the brain was performed following IV bolus contrast injection. Subsequent parametric perfusion maps were calculated using RAPID software. RADIATION DOSE REDUCTION: This exam was performed according to the departmental dose-optimization program which includes automated exposure control, adjustment of the mA and/or kV according to patient size and/or use of iterative reconstruction technique. CONTRAST:  53m OMNIPAQUE IOHEXOL 350 MG/ML SOLN COMPARISON:  Plain head CT 0932 hours today. Previous CTA head and neck 04/26/2022 and earlier. FINDINGS: CT Brain Perfusion Findings: ASPECTS: 10 (chronic encephalomalacia) CBF (<30%) Volume: 034mPerfusion (Tmax>6.0s) volume: 52m62mismatch Volume: Not applicable Infarction Location:None detected CTA NECK Skeleton: Cervical spine degeneration appears stable. Chronic right vertex craniotomy. No acute osseous abnormality identified. Upper chest: Stable, negative. Other neck: Stable, negative. Aortic arch: Stable mild tortuosity of the aortic arch. Three vessel arch configuration. Mild for age arch atherosclerosis. Right carotid system: Minimal brachiocephalic artery plaque without stenosis. Mildly tortuous right CCA with no plaque or stenosis. No significant cervical right ICA plaque or stenosis. Left carotid system: Left CCA origin remains normal. Minimal left CCA plaque without stenosis before the bifurcation. Mild mostly calcified plaque at the left carotid bifurcation. No cervical left ICA stenosis. Vertebral arteries: Minimal plaque in the proximal right subclavian artery. Normal right vertebral artery origin. Patent right vertebral to the skull base with no plaque or stenosis. Stable mild mostly calcified plaque proximal left subclavian artery without stenosis. Normal left vertebral artery origin. Patent mildly dominant left vertebral artery to the skull  base with no plaque or stenosis. CTA HEAD Posterior circulation: Stable distal vertebral arteries without plaque or stenosis. Left V4 segment is dominant. Right PICA origin remains patent. However, the Left PICA origin appears to be occluded now, was normal in June. Small caliber left AICA appears to remain patent. There is some reconstituted left PICA flow. Vertebrobasilar junction and basilar artery appear stable, negative. SCA and right PCA origin remain normal. Fetal type left PCA origin redemonstrated with small tortuous left P1. Bilateral PCA branches are stable and within normal limits. Anterior circulation: Both ICA siphons are patent. No siphon plaque or stenosis. Normal ophthalmic and left posterior communicating artery origins. Patent carotid termini, MCA and ACA origins. Tortuous and mildly dominant right A1 segment redemonstrated. Normal anterior communicating artery. Bilateral ACA branches are stable and within normal limits. Right MCA M1 segment, bifurcation, and right MCA branches are stable and within normal limits. Left MCA M1 segment, bifurcation, and left MCA branches are stable and within normal limits. Venous sinuses: Not well evaluated due to early contrast timing. Anatomic variants: Fetal left PCA origin. Mildly dominant left vertebral artery and right ACA A1 segments. Review of the MIP images confirms the above findings IMPRESSION: 1. 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 Faint left PICA reconstituted enhancement. 2. But otherwise stable and largely negative for age CTA Head and Neck; mild for age atherosclerosis and no other arterial stenosis or occlusion identified. 3. CT Perfusion detects no infarct core or ischemic penumbra. Salient findings were communicated to Dr. Leonel Ramsay at 10:04 am on 09/20/2022 by text page via the [facility] messaging system. Electronically Signed   By: Genevie Ann M.D.   On: 10/07/2022 10:09   CT HEAD CODE STROKE WO  CONTRAST  Result Date: 09/10/2022 CLINICAL DATA:  Code stroke. 76 year old female with aphasia and left gaze. EXAM: CT HEAD WITHOUT CONTRAST TECHNIQUE: Contiguous axial images were obtained from the base of the skull through the vertex without intravenous contrast. RADIATION DOSE REDUCTION: This exam was performed according to the departmental dose-optimization program which includes automated exposure control, adjustment of the mA and/or kV according to patient size and/or use of iterative reconstruction technique. COMPARISON:  Brain MRI 12/28/2021.  Head CT 08/27/2022. FINDINGS: Brain: Chronic posterior frontal, right parietal, and lesser temporal/occipital junction encephalomalacia appears stable with overlying craniotomy. No midline shift, mass effect, or evidence of intracranial mass lesion. No acute intracranial hemorrhage identified. 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 No cortically based acute infarct identified. Vascular: Mild Calcified atherosclerosis at the skull base. No suspicious intracranial vascular hyperdensity. Skull: Previous right posterior craniotomy is stable. No acute osseous abnormality identified. Sinuses/Orbits: Visualized paranasal sinuses and mastoids are stable and well aerated. Other: Leftward gaze is new. Chronic postoperative changes to the scalp. ASPECTS [facility] Stroke Program Early CT Score) Total score (0-10 with 10 being normal): 10 IMPRESSION: 1. No acute cortically based infarct or acute intracranial hemorrhage identified. ASPECTS 10. 2. Stable non contrast CT appearance of the brain, with chronic small vessel ischemia and chronic right hemisphere encephalomalacia, craniotomy. 3. These results were communicated to Dr. Leonel Ramsay at 9:41 am on 09/18/2022 by text page via the [facility] messaging system.  Electronically Signed   By: Genevie Ann M.D.   On: 09/27/2022 09:41       LOS: 1 day   [facility] Pager on [email or web address]  09/29/2022, 9:19 AM

## 2022-09-30 DIAGNOSIS — R1312 Dysphagia, oropharyngeal phase: Secondary | ICD-10-CM | POA: Diagnosis not present

## 2022-09-30 DIAGNOSIS — Z7901 Long term (current) use of anticoagulants: Secondary | ICD-10-CM | POA: Diagnosis not present

## 2022-09-30 DIAGNOSIS — I639 Cerebral infarction, unspecified: Secondary | ICD-10-CM | POA: Diagnosis not present

## 2022-09-30 DIAGNOSIS — I10 Essential (primary) hypertension: Secondary | ICD-10-CM | POA: Diagnosis not present

## 2022-09-30 LAB — CBC
HCT: 34.7 % — ABNORMAL LOW (ref 36.0–46.0)
Hemoglobin: 11.4 g/dL — ABNORMAL LOW (ref 12.0–15.0)
MCH: 27.4 pg (ref 26.0–34.0)
MCHC: 32.9 g/dL (ref 30.0–36.0)
MCV: 83.4 fL (ref 80.0–100.0)
Platelets: 161 10*3/uL (ref 150–400)
RBC: 4.16 MIL/uL (ref 3.87–5.11)
RDW: 15.2 % (ref 11.5–15.5)
WBC: 6.1 10*3/uL (ref 4.0–10.5)
nRBC: 0 % (ref 0.0–0.2)

## 2022-09-30 LAB — T4, FREE: Free T4: 1.15 ng/dL — ABNORMAL HIGH (ref 0.61–1.12)

## 2022-09-30 LAB — COMPREHENSIVE METABOLIC PANEL
ALT: 13 U/L (ref 0–44)
AST: 16 U/L (ref 15–41)
Albumin: 3.3 g/dL — ABNORMAL LOW (ref 3.5–5.0)
Alkaline Phosphatase: 79 U/L (ref 38–126)
Anion gap: 9 (ref 5–15)
BUN: 20 mg/dL (ref 8–23)
CO2: 25 mmol/L (ref 22–32)
Calcium: 9.3 mg/dL (ref 8.9–10.3)
Chloride: 107 mmol/L (ref 98–111)
Creatinine, Ser: 0.69 mg/dL (ref 0.44–1.00)
GFR, Estimated: >60 mL/min (ref >60)
Glucose, Bld: 97 mg/dL (ref 70–99)
Potassium: 3.5 mmol/L (ref 3.5–5.1)
Sodium: 141 mmol/L (ref 135–145)
Total Bilirubin: 0.9 mg/dL (ref 0.3–1.2)
Total Protein: 5.8 g/dL — ABNORMAL LOW (ref 6.5–8.1)

## 2022-09-30 LAB — GLUCOSE, CAPILLARY
Glucose-Capillary: 103 mg/dL — ABNORMAL HIGH (ref 70–99)
Glucose-Capillary: 80 mg/dL (ref 70–99)
Glucose-Capillary: 85 mg/dL (ref 70–99)
Glucose-Capillary: 86 mg/dL (ref 70–99)
Glucose-Capillary: 91 mg/dL (ref 70–99)
Glucose-Capillary: 92 mg/dL (ref 70–99)
Glucose-Capillary: 94 mg/dL (ref 70–99)

## 2022-09-30 LAB — TSH: TSH: 1.328 u[IU]/mL (ref 0.350–4.500)

## 2022-09-30 LAB — PROTIME-INR
INR: 3.2 — ABNORMAL HIGH (ref 0.8–1.2)
Prothrombin Time: 32.8 seconds — ABNORMAL HIGH (ref 11.4–15.2)

## 2022-09-30 MED ORDER — WARFARIN SODIUM 5 MG PO TABS
10.0000 mg | ORAL_TABLET | Freq: Once | ORAL | Status: AC
  Administered 2022-09-30: 10 mg via ORAL
  Filled 2022-09-30: qty 2

## 2022-09-30 NOTE — Progress Notes (Signed)
ANTICOAGULATION CONSULT NOTE - Follow Up Consult  Pharmacy Consult for Warfarin Indication: MVR  Allergies  Allergen Reactions   Zoloft [Sertraline] Hives, Itching and Rash    Patient Measurements: Height: '5\' 6"'$  (167.6 cm) Weight: 53.6 kg (118 lb 2.7 oz) IBW/kg (Calculated) : 59.3  Vital Signs: Temp: 98 F (36.7 C) (11/23 1152) Temp Source: Oral (11/23 1152) BP: 107/71 (11/23 1152) Pulse Rate: 102 (11/23 1152)  Labs: Recent Labs    09/19/2022 0926 10/02/2022 0931 09/29/22 0324 09/30/22 0222  HGB 11.6* 12.9  --  11.4*  HCT 35.6* 38.0  --  34.7*  PLT 172  --   --  161  APTT 45*  --   --   --   LABPROT 40.7*  --  32.2* 32.8*  INR 4.3*  --  3.2* 3.2*  CREATININE 0.79 0.70  --  0.69    Estimated Creatinine Clearance: 50.6 mL/min (by C-G formula based on SCr of 0.69 mg/dL).   Medications:  Scheduled:    stroke: early stages of recovery book   Does not apply Once   aspirin  81 mg Oral Daily   atorvastatin  40 mg Oral Daily   warfarin  10 mg Oral ONCE-1600   Warfarin - Pharmacist Dosing Inpatient   Does not apply q1600   Infusions:   sodium chloride 50 mL/hr at 09/29/22 1510    Assessment: 76 yo F presenting with stoke sx, hx of MVR on warfarin PTA with last dose 11/20. INR on admission supratherapeutic at 4.3. Per medication history obtained from SNF - patient's last PTA dose of warfarin was 10 mg given on 11/20 at ~1700.   Today INR remains therapeutic at 3.2. Hgb 11.4, plt 161 - stable. No signs/symptoms of bleeding noted.   Per discussion with RN - patient was noted to have difficulty swallowing yesterday evening and she did not receive her warfarin 10 mg dose as ordered. However, RN also reported that she was able to give the patient her morning medications today.  Will re-order warfarin 10 mg dose to be given tonight.     PTA dosing: Alternating 10 mg with 5 mg daily   Goal of Therapy:  INR 2.5-3.5 Monitor platelets by anticoagulation protocol: Yes    Plan:  Warfarin 10 mg PO x 1 tonight. Daily INR.  Monitor CBC and for signs/symptoms of bleeding.   Vance Peper, PharmD PGY-2 Pharmacy Resident Phone [phone] 09/30/2022 12:05 PM   Please check AMION for all [place] phone numbers After 10:00 PM, call [place] [phone]

## 2022-09-30 NOTE — Progress Notes (Signed)
[facility] PROGRESS NOTE   [name] OAC:[id number] DOB: 1946/04/24 DOA: 09/15/2022  PCP: Haywood Pao, MD  Brief History/Interval Summary: 76 y.o. 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      Consultants: Neurology.  Palliative care  Procedures: Transthoracic echocardiogram    Subjective/Interval History: Patient continues to be dysarthric.  Not appear to be in any discomfort. Husband is at the bedside.      Assessment/Plan:  Acute stroke MRI raises concern for embolic stroke. Patient was already on warfarin.  Was not given TPA. Patient started on aspirin. Seen by neurology.  Echocardiogram shows normal systolic function.  Moderate pericardial effusion was noted. LDL is 81.  HbA1c is pending. Patient to be started on statin once a feeding tube has been placed. PT OT speech therapy to continue to follow.  Patient thought to be high risk for aspiration. No further workup per neurology.  Continue aspirin along with warfarin.  Oropharyngeal dysphagia Seen by speech therapy.  Not cleared for oral intake.  Cortrak orders have been placed. Tube has not been placed yet.  Moderate pericardial effusion Noted on echocardiogram.  No tamponade physiology.  Previous echocardiogram from 2021 showed small pericardial effusion.  TSH noted to be 1.32.  Free T41.15. Patient followed by Dr. Acie Fredrickson with cardiology. Depending on how patient does over the next 1 to 2 weeks it may be reasonable for her to follow-up with her cardiologist for repeat echocardiogram in a few weeks time.  Essential hypertension Allowing permissive hypertension.  Amlodipine on hold.  Hyperlipidemia Resume statin once feeding tube has been placed.  History of mitral valve replacement On warfarin prior to admission.  Resume warfarin once a feeding tube has been placed.  INR noted  to be 3.2 today.  If there is a significant delay with feeding tube placement then we will place patient on heparin.  Diabetes mellitus type 2 Last HbA1c was 6.1.  HbA1c is pending from this admission.  Not on any glucose lowering agents.  Goals of care Palliative care has been consulted.  Patient is DNR.  Husband wants to proceed with nasogastric feeding tube placement for now.  Based on palliative care notes it appears that he would not want long-term artificial nutrition.  DVT Prophylaxis: On warfarin Code Status: DNR Family Communication: Discussed with patient's husband Disposition Plan: Skilled nursing facility recommended by PT.  Status is: Inpatient Remains inpatient appropriate because: Acute stroke      Medications: Scheduled:   stroke: early stages of recovery book   Does not apply Once   aspirin  81 mg Oral Daily   atorvastatin  40 mg Oral Daily   warfarin  10 mg Oral ONCE-1600   Warfarin - Pharmacist Dosing Inpatient   Does not apply q1600   Continuous:  sodium chloride 50 mL/hr at 09/29/22 1510   feeding supplement (OSMOLITE 1.2 CAL)     WFU:[id number] **OR** acetaminophen (TYLENOL) oral liquid 160 mg/5 mL **OR** acetaminophen, bisacodyl  Antibiotics: Anti-infectives (From admission, onward)    None       Objective:  Vital Signs  Vitals:   09/29/22 2013 09/29/22 2341 09/30/22 0258 09/30/22 0814  BP: 134/86 130/85 118/84 138/84  Pulse: 82 86 93 100  Resp: '16 18 18 17  '$ Temp: 97.8 F (36.6 C) 98.9 F (37.2 C) 97.8 F (36.6 C) 98.3 F (36.8 C)  TempSrc: Oral  Oral Oral  SpO2: 99% 99% 99% 99%  Weight:      Height:       No intake or output data in the 24 hours ending 09/30/22 1005  Filed Weights   09/16/2022 0900  Weight: 53.6 kg    General appearance: Awake alert.  In no distress Resp: Clear to auscultation bilaterally.  Normal effort Cardio: S1-S2 is normal regular.  No S3-S4.  No rubs murmurs or bruit GI: Abdomen is soft.  Nontender  nondistended.  Bowel sounds are present normal.  No masses organomegaly Extremities: No edema.    Lab Results:  Data Reviewed: I have personally reviewed following labs and reports of the imaging studies  CBC: Recent Labs  Lab 10/02/2022 0926 10/01/2022 0931 09/30/22 0222  WBC 5.4  --  6.1  NEUTROABS 3.9  --   --   HGB 11.6* 12.9 11.4*  HCT 35.6* 38.0 34.7*  MCV 83.0  --  83.4  PLT 172  --  161     Basic Metabolic Panel: Recent Labs  Lab 10/02/2022 0926 09/12/2022 0931 09/30/22 0222  NA 137 139 141  K 3.8 4.1 3.5  CL 101 102 107  CO2 27  --  25  GLUCOSE 146* 157* 97  BUN 21 25* 20  CREATININE 0.79 0.70 0.69  CALCIUM 9.0  --  9.3     GFR: Estimated Creatinine Clearance: 50.6 mL/min (by C-G formula based on SCr of 0.69 mg/dL).  Liver Function Tests: Recent Labs  Lab 09/25/2022 0926 09/30/22 0222  AST 16 16  ALT 12 13  ALKPHOS 78 79  BILITOT 0.2* 0.9  PROT 5.7* 5.8*  ALBUMIN 3.1* 3.3*      Coagulation Profile: Recent Labs  Lab 09/09/2022 0926 09/29/22 0324 09/30/22 0222  INR 4.3* 3.2* 3.2*     CBG: Recent Labs  Lab 09/29/22 1605 09/29/22 2014 09/29/22 2340 09/30/22 0401 09/30/22 0816  GLUCAP 105* 109* 103* 85 94     Lipid Profile: Recent Labs    09/29/22 0324  CHOL 153  HDL 53  LDLCALC 81  TRIG 97  CHOLHDL 2.9      Radiology Studies: ECHOCARDIOGRAM COMPLETE  Result Date: 09/29/2022    ECHOCARDIOGRAM REPORT   Patient Name:   [name] Date of Exam: 10/06/2022 Medical Rec #:  [id number]      Height:       66.0 in Accession #:    [id number]     Weight:       118.2 lb Date of Birth:  02/06/1946      BSA:          1.599 m Patient Age:    56 years       BP:           117/84 mmHg Patient Gender: F              HR:           111 bpm. Exam Location:  Inpatient Procedure: 2D Echo Indications:    stroke  History:        Patient has prior history of Echocardiogram examinations, most                 recent 07/02/2020. Stroke, MV replacement; Risk  Factors:Diabetes                 and Hypertension.  Sonographer:    Harvie Junior Referring Phys: [place]  Sonographer Comments: Technically difficult study due to poor echo windows.  IMPRESSIONS  1. Left ventricular ejection fraction, by estimation, is 70 to 75%. The left ventricle has hyperdynamic function. The left ventricle demonstrates regional wall motion abnormalities (abnormal septal motion). Left ventricular diastolic parameters are indeterminate.  2. Right ventricular systolic function is hyperdynamic. The right ventricular size is not well visualized. There is normal pulmonary artery systolic pressure.  3. Moderate pericardial effusion, worst at LV posterior. The pericardial effusion is circumferential and posterior to the left ventricle. There is no mitral valve inflow variation seen. There is no RV collapse. There is no IVC plethora  4. The mitral valve has been replaced. No evidence of mitral valve regurgitation. Study is inadequate to view prosthetic function: significant tachycardia during exam (up to at least 155 bpm). This with off axis imaging makes this study technically difficult. The mean gradient of 4 despite tachycardia is encouraging of the valve function  5. Tricuspid valve regurgitation is moderate to severe.  6. The aortic valve was not well visualized. Aortic valve regurgitation is not visualized. Conclusion(s)/Recommendation(s): Consider reimaging after heart rate control. FINDINGS  Left Ventricle: Left ventricular ejection fraction, by estimation, is 70 to 75%. The left ventricle has hyperdynamic function. The left ventricle demonstrates regional wall motion abnormalities. The left ventricular internal cavity size was small. There  is no left ventricular hypertrophy. Left ventricular diastolic parameters are indeterminate.  LV Wall Scoring: The anterior septum, mid inferoseptal segment, and basal inferoseptal segment are hypokinetic. The basal inferolateral segment is normal.  Right Ventricle: The right ventricular size is not well visualized. Right vetricular wall thickness was not well visualized. Right ventricular systolic function is hyperdynamic. There is normal pulmonary artery systolic pressure. The tricuspid regurgitant velocity is 2.74 m/s, and with an assumed right atrial pressure of 3 mmHg, the estimated right ventricular systolic pressure is 46.5 mmHg. Left Atrium: Left atrial size was not well visualized. Right Atrium: Right atrial size was not well visualized. Pericardium: A moderately sized pericardial effusion is present. The pericardial effusion is circumferential and posterior to the left ventricle. Mitral Valve: The mitral valve has been repaired/replaced. No evidence of mitral valve regurgitation. MV peak gradient, 13.1 mmHg. The mean mitral valve gradient is 4.0 mmHg. Tricuspid Valve: The tricuspid valve is normal in structure. Tricuspid valve regurgitation is moderate to severe. Aortic Valve: The aortic valve was not well visualized. Aortic valve regurgitation is not visualized. Pulmonic Valve: The pulmonic valve was not well visualized. Pulmonic valve regurgitation is not visualized. Aorta: The aortic root and ascending aorta are structurally normal, with no evidence of dilitation. IAS/Shunts: The interatrial septum was not well visualized.  LEFT VENTRICLE PLAX 2D LVIDd:         3.20 cm     Diastology LVIDs:         2.40 cm     LV e' medial:    11.60 cm/s LV PW:         0.90 cm     LV E/e' medial:  17.2 LV IVS:        1.10 cm     LV e' lateral:   13.60 cm/s LVOT diam:     1.80 cm     LV E/e' lateral: 14.6 LV SV:         24 LV SV Index:   15 LVOT Area:     2.54 cm  LV Volumes (MOD) LV vol d, MOD A4C: 64.8 ml LV vol s, MOD A4C: 28.8 ml LV SV MOD A4C:     64.8 ml  RIGHT VENTRICLE RV Basal diam:  3.20 cm RV Mid diam:    2.70 cm RV S prime:     7.94 cm/s TAPSE (M-mode): 1.2 cm LEFT ATRIUM           Index        RIGHT ATRIUM           Index LA diam:      2.60 cm 1.63  cm/m   RA Area:     11.90 cm LA Vol (A4C): 24.5 ml 15.32 ml/m  RA Volume:   29.00 ml  18.14 ml/m  AORTIC VALVE             PULMONIC VALVE LVOT Vmax:   86.70 cm/s  PV Vmax:       0.87 m/s LVOT Vmean:  55.200 cm/s PV Peak grad:  3.0 mmHg LVOT VTI:    0.096 m  AORTA Ao Root diam: 3.70 cm Ao Asc diam:  3.60 cm MITRAL VALVE                TRICUSPID VALVE MV Area (PHT): 5.62 cm     TR Peak grad:   30.0 mmHg MV Area VTI:   0.87 cm     TR Vmax:        274.00 cm/s MV Peak grad:  13.1 mmHg MV Mean grad:  4.0 mmHg     SHUNTS MV Vmax:       1.81 m/s     Systemic VTI:  0.10 m MV Vmean:      78.7 cm/s    Systemic Diam: 1.80 cm MV Decel Time: 135 msec MV E velocity: 199.00 cm/s MV A velocity: 50.70 cm/s MV E/A ratio:  3.93 Rudean Haskell MD Electronically signed by Rudean Haskell MD Signature Date/Time: 09/16/2022/6:07:59 PM    Final    MR BRAIN WO CONTRAST  Result Date: 09/22/2022 CLINICAL DATA:  L Stroke, follow up EXAM: MRI HEAD WITHOUT CONTRAST TECHNIQUE: Multiplanar, multiecho pulse sequences of the brain and surrounding structures were obtained without intravenous contrast. COMPARISON:  Same day CT head.  MRI head December 28, 2021. FINDINGS: Brain: Multiple acute or early subacute infarcts involving the paramedian right cerebellum, left pons, bilateral hippocampi, left basal ganglia, left posterior limb of the internal capsule and scattered throughout the cerebral cortex bilaterally. No evidence of acute hemorrhage, mass lesion, midline shift or hydrocephalus. Chronic areas of posterior frontal, right parietal and to a lesser extent the left temporal/occipital junction encephalomalacia. Multiple remote cerebellar infarcts. Patchy T2/FLAIR hyperintensities the white matter, compatible with carotid leak [place] vascular ischemic disease. Superficial siderosis along the cerebral convexities bilaterally, compatible with prior subarachnoid hemorrhage. Vascular: Evaluated on same day CTA. Skull and upper  cervical spine: Right-sided parietal craniotomy. Otherwise, normal marrow signal. Sinuses/Orbits: Negative. Other: No sizable mastoid effusions. IMPRESSION: 1. Many small scattered acute or early subacute infarcts throughout the infratentorial and supratentorial brain, detailed above and probably embolic. 2. Multifocal encephalomalacia. 3. Evidence of prior subarachnoid hemorrhage. Electronically Signed   By: Margaretha Sheffield M.D.   On: 09/29/2022 15:08   EEG adult  Result Date: 09/27/2022 Lora Havens, MD     10/01/2022 11:23 AM Patient Name: [name] MRN: [id number] Epilepsy Attending: Lora Havens Referring Physician/Provider: Greta Doom, MD Date: 09/12/2022 Duration: 22.05 mins Patient history: 76 year old female presented with forced left gaze, right sided weakness/sensory deficits.  EEG to evaluate for seizure. Level of alertness:  lethargic AEDs during EEG study: None Technical aspects: This EEG  study was done with scalp electrodes positioned according to the 10-20 International system of electrode placement. Electrical activity was reviewed with band pass filter of 1-'70Hz'$ , sensitivity of 7 uV/mm, display speed of 42m/sec with a '60Hz'$  notched filter applied as appropriate. EEG data were recorded continuously and digitally stored.  Video monitoring was available and reviewed as appropriate. Description: EEG showed continuous low amplitude 2 to 3 Hz delta slowing in left hemisphere.  Additionally there was 5 to 8 Hz theta-alpha activity admixed with 12 to 15 Hz beta activity in right hemisphere.  Hyperventilation and photic stimulation were not performed.   ABNORMALITY -Continuous slow, generalized and lateralized left hemisphere IMPRESSION: This study is suggestive of cortical dysfunction in left hemisphere, likely secondary to underlying structural abnormality, postictal state.  Additionally there was moderate diffuse encephalopathy, nonspecific etiology.  No seizures or  epileptiform discharges were seen throughout the recording. [place]      LOS: 2 days   Sheina Mcleish [email or web address] on [email or web address]  09/30/2022, 10:05 AM

## 2022-10-01 ENCOUNTER — Inpatient Hospital Stay (HOSPITAL_COMMUNITY): Payer: Medicare Other

## 2022-10-01 DIAGNOSIS — I639 Cerebral infarction, unspecified: Secondary | ICD-10-CM | POA: Diagnosis not present

## 2022-10-01 DIAGNOSIS — R1312 Dysphagia, oropharyngeal phase: Secondary | ICD-10-CM | POA: Diagnosis not present

## 2022-10-01 DIAGNOSIS — R471 Dysarthria and anarthria: Secondary | ICD-10-CM

## 2022-10-01 DIAGNOSIS — Z7901 Long term (current) use of anticoagulants: Secondary | ICD-10-CM | POA: Diagnosis not present

## 2022-10-01 DIAGNOSIS — Z7189 Other specified counseling: Secondary | ICD-10-CM | POA: Diagnosis not present

## 2022-10-01 DIAGNOSIS — R791 Abnormal coagulation profile: Secondary | ICD-10-CM | POA: Diagnosis not present

## 2022-10-01 DIAGNOSIS — I10 Essential (primary) hypertension: Secondary | ICD-10-CM | POA: Diagnosis not present

## 2022-10-01 DIAGNOSIS — E43 Unspecified severe protein-calorie malnutrition: Secondary | ICD-10-CM | POA: Insufficient documentation

## 2022-10-01 DIAGNOSIS — Z515 Encounter for palliative care: Secondary | ICD-10-CM | POA: Diagnosis not present

## 2022-10-01 LAB — CBC
HCT: 34.8 % — ABNORMAL LOW (ref 36.0–46.0)
Hemoglobin: 11 g/dL — ABNORMAL LOW (ref 12.0–15.0)
MCH: 26.9 pg (ref 26.0–34.0)
MCHC: 31.6 g/dL (ref 30.0–36.0)
MCV: 85.1 fL (ref 80.0–100.0)
Platelets: 146 10*3/uL — ABNORMAL LOW (ref 150–400)
RBC: 4.09 MIL/uL (ref 3.87–5.11)
RDW: 15.1 % (ref 11.5–15.5)
WBC: 4.9 10*3/uL (ref 4.0–10.5)
nRBC: 0 % (ref 0.0–0.2)

## 2022-10-01 LAB — GLUCOSE, CAPILLARY
Glucose-Capillary: 80 mg/dL (ref 70–99)
Glucose-Capillary: 76 mg/dL (ref 70–99)
Glucose-Capillary: 119 mg/dL — ABNORMAL HIGH (ref 70–99)
Glucose-Capillary: 111 mg/dL — ABNORMAL HIGH (ref 70–99)
Glucose-Capillary: 201 mg/dL — ABNORMAL HIGH (ref 70–99)

## 2022-10-01 LAB — MAGNESIUM
Magnesium: 1.7 mg/dL (ref 1.7–2.4)
Magnesium: 1.7 mg/dL (ref 1.7–2.4)

## 2022-10-01 LAB — PROTIME-INR
INR: 3.9 — ABNORMAL HIGH (ref 0.8–1.2)
Prothrombin Time: 38.1 seconds — ABNORMAL HIGH (ref 11.4–15.2)

## 2022-10-01 LAB — PHOSPHORUS
Phosphorus: 3.3 mg/dL (ref 2.5–4.6)
Phosphorus: 2.6 mg/dL (ref 2.5–4.6)

## 2022-10-01 LAB — HEMOGLOBIN A1C
Hgb A1c MFr Bld: 6.2 % — ABNORMAL HIGH (ref 4.8–5.6)
Mean Plasma Glucose: 131 mg/dL

## 2022-10-01 MED ORDER — FREE WATER
200.0000 mL | Freq: Three times a day (TID) | Status: DC
  Administered 2022-10-01 – 2022-10-07 (×18): 200 mL

## 2022-10-01 MED ORDER — PROSOURCE TF20 ENFIT COMPATIBL EN LIQD
60.0000 mL | Freq: Every day | ENTERAL | Status: DC
  Administered 2022-10-01 – 2022-10-06 (×6): 60 mL
  Filled 2022-10-01 (×7): qty 60

## 2022-10-01 MED ORDER — JEVITY 1.5 CAL/FIBER PO LIQD
1000.0000 mL | ORAL | Status: DC
  Administered 2022-10-01 – 2022-10-06 (×5): 1000 mL
  Filled 2022-10-01: qty 1000
  Filled 2022-10-01: qty 948
  Filled 2022-10-01: qty 1000
  Filled 2022-10-01: qty 948
  Filled 2022-10-01 (×4): qty 1000
  Filled 2022-10-01: qty 948
  Filled 2022-10-01: qty 1000

## 2022-10-01 NOTE — Procedures (Signed)
Cortrak  Person Inserting Tube:  Ranell Patrick D, RD Tube Type:  Cortrak - 43 inches Tube Size:  10 Tube Location:  Left nare Secured by: Bridle Technique Used to Measure Tube Placement:  Marking at nare/corner of mouth Cortrak Secured At:  71 cm Procedure Comments:  Cortrak Tube Team Note:  Consult received to place a Cortrak feeding tube.   X-ray is required, abdominal x-ray has been ordered by the Cortrak team. Please confirm tube placement before using the Cortrak tube.   If the tube becomes dislodged please keep the tube and contact the Cortrak team at [email or web address] for replacement.  If after hours and replacement cannot be delayed, place a NG tube and confirm placement with an abdominal x-ray.    Ranell Patrick, RD, LDN Clinical Dietitian RD pager # available in [place]  After hours/weekend pager # available in [facility]

## 2022-10-01 NOTE — Progress Notes (Signed)
ANTICOAGULATION CONSULT NOTE - Follow Up Consult  Pharmacy Consult for Warfarin Indication: MVR  Allergies  Allergen Reactions   Zoloft [Sertraline] Hives, Itching and Rash    Patient Measurements: Height: '5\' 6"'$  (167.6 cm) Weight: 50.2 kg (110 lb 10.7 oz) IBW/kg (Calculated) : 59.3  Vital Signs: Temp: 97.8 F (36.6 C) (11/24 0735) Temp Source: Oral (11/24 0735) BP: 120/84 (11/24 0735) Pulse Rate: 98 (11/24 0735)  Labs: Recent Labs    09/19/2022 0926 09/11/2022 0931 09/29/22 0324 09/30/22 0222 10/01/22 0238  HGB 11.6* 12.9  --  11.4* 11.0*  HCT 35.6* 38.0  --  34.7* 34.8*  PLT 172  --   --  161 146*  APTT 45*  --   --   --   --   LABPROT 40.7*  --  32.2* 32.8* 38.1*  INR 4.3*  --  3.2* 3.2* 3.9*  CREATININE 0.79 0.70  --  0.69  --      Estimated Creatinine Clearance: 47.4 mL/min (by C-G formula based on SCr of 0.69 mg/dL).   Medications:  Scheduled:    stroke: early stages of recovery book   Does not apply Once   aspirin  81 mg Oral Daily   atorvastatin  40 mg Oral Daily   Warfarin - Pharmacist Dosing Inpatient   Does not apply q1600   Infusions:   sodium chloride 50 mL/hr at 10/01/22 8768    Assessment: 76 yo F presenting with stoke sx, hx of MVR on warfarin PTA with last dose 11/20. INR on admission supratherapeutic at 4.3. Per medication history obtained from SNF - patient's last PTA dose of warfarin was 10 mg given on 11/20 at ~1700.   Todays INR is supratherapeutic at 3.9. Hgb 11.0, plt 146 - stable. No signs/symptoms of bleeding noted.   Patients INR went from 3.2 to 3.9 with one Warfarin 10 mg dose. Will plan to hold tonight's dose to return to goal range.      PTA dosing: Alternating 10 mg with 5 mg daily   Goal of Therapy:  INR 2.5-3.5 Monitor platelets by anticoagulation protocol: Yes   Plan:  Hold Warfarin tonight.  Continue daily INR.  Monitor CBC and for signs/symptoms of bleeding.  Vicenta Dunning, PharmD  PGY1 Pharmacy Resident    Please check AMION for all [place] phone numbers After 10:00 PM, call [place] [phone]

## 2022-10-01 NOTE — Plan of Care (Signed)
  Problem: Education: Goal: Knowledge of disease or condition will improve Outcome: Progressing Goal: Knowledge of secondary prevention will improve (MUST DOCUMENT ALL) Outcome: Progressing Goal: Knowledge of patient specific risk factors will improve [place] N/A or DELETE if not current risk factor) Outcome: Progressing   Problem: Ischemic Stroke/TIA Tissue Perfusion: Goal: Complications of ischemic stroke/TIA will be minimized Outcome: Progressing   Problem: Self-Care: Goal: Ability to participate in self-care as condition permits will improve Outcome: Progressing   Problem: Nutrition: Goal: Risk of aspiration will decrease Outcome: Progressing Goal: Dietary intake will improve Outcome: Progressing

## 2022-10-01 NOTE — Progress Notes (Signed)
[facility] PROGRESS NOTE   [name] DHR:[id number] DOB: Mar 15, 1946 DOA: 10/05/2022  PCP: Haywood Pao, MD  Brief History/Interval Summary: 76 y.o. 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      Consultants: Neurology.  Palliative care  Procedures: Transthoracic echocardiogram    Subjective/Interval History: Patient speech appears to be improving.  She denies any pain at this time.      Assessment/Plan:  Acute stroke MRI raised concern for embolic stroke. Patient was already on warfarin.  Was not given TPA. Seen by neurology.  Patient started on aspirin. Echocardiogram shows normal systolic function.  Moderate pericardial effusion was noted. LDL is 81.  HbA1c is 6.2. Patient to be started on statin once a feeding tube has been placed. PT OT speech therapy to continue to follow.   Patient thought to be high risk for aspiration. No further workup per neurology.  Continue aspirin along with warfarin.  Oropharyngeal dysphagia Seen by speech therapy.  Not cleared for oral intake.  Cortrak orders have been placed. Tube to be placed today.  Moderate pericardial effusion Noted on echocardiogram.  No tamponade physiology.  Previous echocardiogram from 2021 showed small pericardial effusion.  TSH noted to be 1.32.  Free T41.15. Patient followed by Dr. Acie Fredrickson with cardiology. Depending on how patient does over the next 1 to 2 weeks it may be reasonable for her to follow-up with her cardiologist for repeat echocardiogram in a few weeks time.  Essential hypertension Allowing permissive hypertension.  Amlodipine on hold.  Hyperlipidemia Resume statin once feeding tube has been placed.  History of mitral valve replacement On warfarin prior to admission. INR noted to be 3.9 today.   Warfarin being managed by pharmacy.  Normocytic anemia/mild  thrombocytopenia No evidence of overt bleeding.  Continue to monitor periodically.  Diabetes mellitus type 2 Last HbA1c was 6.1.  HbA1c is pending from this admission.  Not on any glucose lowering agents.  Goals of care Palliative care has been consulted.  Patient is DNR.  Husband wants to proceed with nasogastric feeding tube placement for now.  Based on palliative care notes it appears that he would not want long-term artificial nutrition.  DVT Prophylaxis: On warfarin Code Status: DNR Family Communication: Discussed with patient's husband Disposition Plan: Skilled nursing facility recommended by PT.  Status is: Inpatient Remains inpatient appropriate because: Acute stroke      Medications: Scheduled:   stroke: early stages of recovery book   Does not apply Once   aspirin  81 mg Oral Daily   atorvastatin  40 mg Oral Daily   Warfarin - Pharmacist Dosing Inpatient   Does not apply q1600   Continuous:  sodium chloride 50 mL/hr at 10/01/22 0626   [id number] **OR** acetaminophen (TYLENOL) oral liquid 160 mg/5 mL **OR** acetaminophen, bisacodyl  Antibiotics: Anti-infectives (From admission, onward)    None       Objective:  Vital Signs  Vitals:   09/30/22 2346 10/01/22 0500 10/01/22 0507 10/01/22 0735  BP: 117/81  135/82 120/84  Pulse: 75  81 98  Resp: '20  18 17  '$ Temp: 98 F (36.7 C)  98 F (36.7 C) 97.8 F (36.6 C)  TempSrc: Oral  Oral Oral  SpO2: 99%  100% 99%  Weight:  50.2 kg    Height:        Intake/Output Summary (Last 24 hours) at 10/01/2022 0946 Last data  filed at 10/01/2022 0458 Gross per 24 hour  Intake 1560.83 ml  Output 300 ml  Net 1260.83 ml    Filed Weights   09/08/2022 0900 10/01/22 0500  Weight: 53.6 kg 50.2 kg    General appearance: Awake alert.  In no distress.  Seems to be more alert today compared to yesterday. Resp: Clear to auscultation bilaterally.  Normal effort Cardio: S1-S2 is normal regular.  No S3-S4.  No rubs  murmurs or bruit GI: Abdomen is soft.  Nontender nondistended.  Bowel sounds are present normal.  No masses organomegaly Extremities: No edema.  Physical deconditioning is noted.   Lab Results:  Data Reviewed: I have personally reviewed following labs and reports of the imaging studies  CBC: Recent Labs  Lab 09/25/2022 0926 09/10/2022 0931 09/30/22 0222 10/01/22 0238  WBC 5.4  --  6.1 4.9  NEUTROABS 3.9  --   --   --   HGB 11.6* 12.9 11.4* 11.0*  HCT 35.6* 38.0 34.7* 34.8*  MCV 83.0  --  83.4 85.1  PLT 172  --  161 146*     Basic Metabolic Panel: Recent Labs  Lab 09/20/2022 0926 10/04/2022 0931 09/30/22 0222  NA 137 139 141  K 3.8 4.1 3.5  CL 101 102 107  CO2 27  --  25  GLUCOSE 146* 157* 97  BUN 21 25* 20  CREATININE 0.79 0.70 0.69  CALCIUM 9.0  --  9.3     GFR: Estimated Creatinine Clearance: 47.4 mL/min (by C-G formula based on SCr of 0.69 mg/dL).  Liver Function Tests: Recent Labs  Lab 09/26/2022 0926 09/30/22 0222  AST 16 16  ALT 12 13  ALKPHOS 78 79  BILITOT 0.2* 0.9  PROT 5.7* 5.8*  ALBUMIN 3.1* 3.3*      Coagulation Profile: Recent Labs  Lab 09/29/2022 0926 09/29/22 0324 09/30/22 0222 10/01/22 0238  INR 4.3* 3.2* 3.2* 3.9*     CBG: Recent Labs  Lab 09/30/22 1627 09/30/22 2042 09/30/22 2347 10/01/22 0353 10/01/22 0737  GLUCAP 92 86 80 80 76     Lipid Profile: Recent Labs    09/29/22 0324  CHOL 153  HDL 53  LDLCALC 81  TRIG 97  CHOLHDL 2.9      Radiology Studies: No results found.     LOS: 3 days   [place] [employer] on [email or web address]  10/01/2022, 9:46 AM

## 2022-10-01 NOTE — Plan of Care (Signed)

## 2022-10-01 NOTE — Progress Notes (Signed)
Initial Nutrition Assessment  DOCUMENTATION CODES:   Severe malnutrition in context of chronic illness  INTERVENTION:  - Initiate Jevity 1.5 @ 15 mL/hr and advance by 10 mL/hr Q12H to goal rate of 45 mL/hr + TF 20 PS q day.  - This will provide 1700 kcals, 88 gm protein, 820 mL free water.   - Add Magic cup BID with meals, each supplement provides 290 kcal and 9 grams of protein   NUTRITION DIAGNOSIS:   Severe Malnutrition related to chronic illness, lethargy/confusion as evidenced by severe fat depletion, severe muscle depletion, percent weight loss (19.8% wt loss in 5 months).  GOAL:   Patient will meet greater than or equal to 90% of their needs  MONITOR:   TF tolerance  REASON FOR ASSESSMENT:   Consult Assessment of nutrition requirement/status, Enteral/tube feeding initiation and management  ASSESSMENT:   76 y.o. female admits related to stroke-like symptoms. PMH includes: DM, CVA, MVR, HTN. Pt is currently receiving medical management for acute stroke.  Meds include: warfarin. IVF: NS @ 50 mL/hr. Labs reviewed: WDL.   Pt was present with husband at bedside at time of assessment. Pt with Cortrak in place. Pt's diet was advanced to Dys 1, Honey thick liquids. Pt states that she has not had anything to eat today. Pt very soft spoken. Pt's husband was able to answer most questions. He states that the pt was eating well PTA with good appetite. Per record, pt has experienced a 19.8% wt loss in 5 months, which is significant for timeframe.   Pt likely at risk for refeeding. RD will advance TF slowly. Pt also has IVF running, will hold off on adding FWF.   NUTRITION - FOCUSED PHYSICAL EXAM:  Flowsheet Row Most Recent Value  Orbital Region Moderate depletion  Upper Arm Region Severe depletion  Thoracic and Lumbar Region Unable to assess  Buccal Region Severe depletion  Temple Region Severe depletion  Clavicle Bone Region Severe depletion  Clavicle and Acromion Bone Region  Severe depletion  Scapular Bone Region Unable to assess  Dorsal Hand Severe depletion  Patellar Region Unable to assess  Anterior Thigh Region Unable to assess  Posterior Calf Region Unable to assess  Edema (RD Assessment) None  Hair Reviewed  Eyes Reviewed  Mouth Reviewed  Skin Reviewed  Nails Reviewed       Diet Order:   Diet Order             DIET - DYS 1 Room service appropriate? Yes; Fluid consistency: Honey Thick  Diet effective now                   EDUCATION NEEDS:   Not appropriate for education at this time  Skin:  Skin Assessment: Reviewed RN Assessment  Last BM:  09/29/22  Height:   Ht Readings from Last 1 Encounters:  09/10/2022 '5\' 6"'$  (1.676 m)    Weight:   Wt Readings from Last 1 Encounters:  10/01/22 50.2 kg    Ideal Body Weight:  59.1 kg  BMI:  Body mass index is 17.86 kg/m.  Estimated Nutritional Needs:   Kcal:  4098-1191 kcals  Protein:  75-90 gm  Fluid:  >/= 1.5 L  Thalia Bloodgood, RD, LDN, CNSC.

## 2022-10-01 NOTE — TOC Progression Note (Signed)
Transition of Care [facility]) - Progression Note    Patient Details  Name: [name] MRN: [id number] Date of Birth: [birth year]  Transition of Care [facility]) CM/SW Contact  Jinger Neighbors, [place] Phone Number: 10/01/2022, 3:10 PM  Clinical Narrative:     Attempted to confirm bed information at [facility] and left a vm for [employer]. Not medically stable at this time and will continue to follow.         Expected Discharge Plan and Services                                                 Social Determinants of Health (SDOH) Interventions    Readmission Risk Interventions     No data to display

## 2022-10-01 NOTE — Progress Notes (Signed)
Daily Progress Note   Patient Name: [name]       Date: 10/01/2022 DOB: 03/23/1946  Age: 76 y.o. MRN#: [id number] Attending Physician: Bonnielee Haff, MD Primary Care Physician: Haywood Pao, MD Admit Date: 09/11/2022  Reason for Consultation/Follow-up: Establishing goals of care  Subjective: Chart review performed. Coretrak placed today, MBS completed. Received report from primary RN - no acute concerns.  Went to visit patient at bedside - husband/Robert present. Patient was lying in bed awake, alert, oriented, and able to participate in conversation. She remains dysarthric; hard to understand at times. No signs or non-verbal gestures of pain or discomfort noted. No respiratory distress, increased work of breathing, or secretions noted. She denies pain. Patient is tired and weak appearing.  Emotional support provided to patient and family. Reviewed plan now that coretrak has been placed - tube feeds to start today. Provided updates and education that patient's diet was advanced to Dys 1, honey thick. Reviewed this is a small improvement in swallowing since her admission.   Therapeutic listening provided as patient tells me placement for coretrak was painful; she wonders if tube feeds will be painful - education provided that it should not be painful but to notify RN if it was, patient was glad to hear this. Call bell education provided.  Goal is to start tube feeds today, see how she tolerates dys 1 diet, and continue to allow time for outcomes.  All questions and concerns addressed. Encouraged to call with questions and/or concerns. PMT card previously provided.  Length of Stay: 3  Current Medications: Scheduled Meds:    stroke: early stages of recovery book   Does not apply  Once   aspirin  81 mg Oral Daily   atorvastatin  40 mg Oral Daily   Warfarin - Pharmacist Dosing Inpatient   Does not apply q1600    Continuous Infusions:  sodium chloride 50 mL/hr at 10/01/22 0626    PRN Meds: acetaminophen **OR** acetaminophen (TYLENOL) oral liquid 160 mg/5 mL **OR** acetaminophen, bisacodyl  Physical Exam Vitals and nursing note reviewed.  Constitutional:      General: She is not in acute distress.    Appearance: She is ill-appearing.  Pulmonary:     Effort: No respiratory distress.  Skin:    General: Skin is warm and dry.  Neurological:  Mental Status: She is alert and oriented to person, place, and time.     Motor: Weakness present.  Psychiatric:        Attention and Perception: Attention normal.        Behavior: Behavior is cooperative.        Cognition and Memory: Cognition and memory normal.             Vital Signs: BP 120/84 (BP Location: Right Arm)   Pulse 98   Temp 97.8 F (36.6 C) (Oral)   Resp 17   Ht '5\' 6"'$  (1.676 m)   Wt 50.2 kg   SpO2 99%   BMI 17.86 kg/m  SpO2: SpO2: 99 % O2 Device: O2 Device: Room Air O2 Flow Rate:    Intake/output summary:  Intake/Output Summary (Last 24 hours) at 10/01/2022 1117 Last data filed at 10/01/2022 0458 Gross per 24 hour  Intake 1560.83 ml  Output 300 ml  Net 1260.83 ml   LBM: Last BM Date : 09/29/22 Baseline Weight: Weight: 53.6 kg Most recent weight: Weight: 50.2 kg       Palliative Assessment/Data: PPS 30% with tube feeds      Patient Active Problem List   Diagnosis Date Noted   DNR (do not resuscitate) 10/02/2022   AMS (altered mental status) 08/28/2022   Constipation 08/28/2022   Gastritis 08/28/2022   Subarachnoid hemorrhage ([place]) 04/30/2022   SAH (subarachnoid hemorrhage) ([place]) 04/26/2022   Goals of care, counseling/discussion    Palliative care by specialist    Acute CVA (cerebrovascular accident) ([place]) 07/01/2020   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    Anticoagulated on warfarin    Left hemiparesis ([place])    History of CVA with residual deficit    Thrombocytopenia ([place])    Prediabetes    Vertigo 09/24/2017   Benign paroxysmal positional vertigo    History of stroke    HLD (hyperlipidemia) 08/23/2017   Encounter for therapeutic drug monitoring 08/08/2017   Long term (current) use of anticoagulants [Z79.01] 07/12/2017   Hemiparesis and alteration of sensations as late effects of stroke ([place]) 09/07/2016   Abnormality of gait 09/07/2016   HTN (hypertension) 04/13/2016   H/O mitral valve replacement with mechanical valve 10/16/2015   Diabetes mellitus without complication (HCC)    Hemiparesis (L sided - mild) due to old stroke [facility])     Palliative Care Assessment & Plan   Patient Profile: 76 y.o. female  with past medical history of  DM, multiple CVAs with hemiparesis, MVR on Coumadin, and HTN presented to ED on 09/23/2022 from [facility] with stroke-like symptoms - left sided gaze, right sided weakness. Patient was admitted on 10/03/2022 with acute CVA.    Of note, patient has had 3 hospital admissions in the last 6 months and is a 30 day readmission.  Assessment: Principal Problem:   Acute CVA (cerebrovascular accident) ([place]) Active Problems:   Diabetes mellitus without complication ([place])   H/O mitral valve replacement with mechanical valve   HTN (hypertension)   HLD (hyperlipidemia)   DNR (do not resuscitate)   Recommendations/Plan: Continue full scope and allow time for outcomes Continue DNR/DNI as previously documented Tube feeds to start today Patient is not sure she would want her life prolonged with PEG Ongoing GOC pending clinical course PMT will continue to follow and support holistically  Goals of Care and Additional Recommendations: Limitations on Scope of Treatment: Full Scope Treatment and No Tracheostomy  Code Status:    Code Status Orders  (From admission, onward)  Start     Ordered    10/04/2022 1152  Do not attempt resuscitation (DNR)  Continuous       Question Answer Comment  In the event of cardiac or respiratory ARREST Do not call a "code blue"   In the event of cardiac or respiratory ARREST Do not perform Intubation, CPR, defibrillation or ACLS   In the event of cardiac or respiratory ARREST Use medication by any route, position, wound care, and other measures to relive pain and suffering. May use oxygen, suction and manual treatment of airway obstruction as needed for comfort.      10/04/2022 1153           Code Status History     Date Active Date Inactive Code Status Order ID Comments User Context   08/28/2022 0136 08/30/2022 2248 DNR [id number]  Ileene Musa T, DO ED   04/30/2022 1614 06/01/2022 1519 Full Code [id number]  Cathlyn Parsons, PA-C Inpatient   04/30/2022 1614 04/30/2022 1614 Full Code [id number]  Cathlyn Parsons, PA-C Inpatient   04/26/2022 1535 04/30/2022 1611 Full Code [id number]  Lequita Halt, MD ED   07/04/2020 1511 07/16/2020 1649 Full Code [id number]  Cathlyn Parsons, PA-C Inpatient   07/04/2020 1511 07/04/2020 1511 Full Code [id number]  Cathlyn Parsons, PA-C Inpatient   07/02/2020 0253 07/04/2020 1503 Full Code [id number]  Chotiner, Yevonne Aline, MD ED   09/05/2019 1625 09/15/2019 1404 Full Code [id number]  Cathlyn Parsons, PA-C Inpatient   09/05/2019 1625 09/05/2019 1625 Full Code [id number]  Cathlyn Parsons, PA-C Inpatient   09/03/2019 0932 09/05/2019 1612 Full Code [id number]  Norval Morton, MD ED   09/24/2017 0352 09/24/2017 2226 Full Code [id number]  Etta Quill, DO ED       Prognosis:  Unable to determine  Discharge Planning: To Be Determined  Care plan was discussed with primary RN, patient, patient's husband  Thank you for allowing the Palliative Medicine Team to assist in the care of this patient.   Total Time 35 minutes Prolonged Time Billed  no       Greater than 50%  of this time was spent counseling and coordinating care  related to the above assessment and plan.  Lin Landsman, NP  Please contact Palliative Medicine Team phone at [phone] for questions and concerns.   *Portions of this note are a verbal dictation therefore any spelling and/or grammatical errors are due to the "[place] [employer]" system interpretation.

## 2022-10-01 NOTE — Progress Notes (Signed)
Occupational Therapy Treatment Patient Details Name: [name] MRN: [id number] DOB: September 13, 1946 Today's Date: 10/01/2022   History of present illness 76 yo female with onset of stroke-like symptoms was admitted on 11/21, now found to have many small scattered acute or early subacute infarcts throughout the infratentorial and supratentorial brain, probably embolic. PMHx:  encephalomalacia, subarachnoid hemorrhage overlying the left greater than right parietaoccipital lobes, post mechanical mitral valve replacement, hemiparesis, HTN, HLD, DM, PNA   OT comments  Patient progressing and is highly motivated, agreeing to work at [employer] again after doing so with physical therapy 2-3 hours prior. Pt showed improved ability to use LT hand functionally for grooming at bed level and began lifting head to neutral and lifting alternating Ues while EOB, compared to previous session. Patient remains limited by RT UE flaccidity, impaired motor planning, profound generalized weakness and decreased activity tolerance along with deficits noted below. Pt continues to demonstrate good rehab potential and would benefit from continued skilled OT to increase safety and independence with ADLs and functional transfers to allow pt to return home safely and reduce caregiver burden and fall risk.    Recommendations for follow up therapy are one component of a multi-disciplinary discharge planning process, led by the attending physician.  Recommendations may be updated based on patient status, additional functional criteria and insurance authorization.    Follow Up Recommendations  Skilled nursing-short term rehab (<3 hours/day)     Assistance Recommended at Discharge Frequent or constant Supervision/Assistance  Patient can return home with the following  A lot of help with bathing/dressing/bathroom;Two people to help with walking and/or transfers;Assist for transportation;Assistance with feeding   Equipment  Recommendations  None recommended by OT    Recommendations for Other Services      Precautions / Restrictions Precautions Precautions: Fall Precaution Comments: Watch HR, coretrack Restrictions Weight Bearing Restrictions: No       Mobility Bed Mobility Overal bed mobility: Needs Assistance Bed Mobility: Supine to Sit, Sit to Supine     Supine to sit: Max assist Sit to supine: Max assist   General bed mobility comments: total support as pt is fairly weak on R side,  Pt able to bend each knee and perform 5 reps of sub maximual bridging exercises.    Transfers                         Balance Overall balance assessment: Needs assistance Sitting-balance support: Feet supported, Bilateral upper extremity supported Sitting balance-Leahy Scale: Poor Sitting balance - Comments: Pt sat EOB for 15 min. Poor sitting balance, very slumped anteriorly and to RT with initial inability to lift head /chin off chest.  Pt provided with 3 rep sustained thoracic extension stretches, pec stretches and assisted head to neutral. After 2-3 reps of head assist pt able to lift head on own with cue to "look up to the ceiling". Pt then able to maintain head in neutral several reps for ~30-60 seconds at a time. Progressing sitting to lifting one UE at a time off bed with tactile cues to locate correct UE to command. Postural control: Posterior lean, Right lateral lean (anterior lean)                                 ADL either performed or assessed with clinical judgement   ADL Overall ADL's : Needs assistance/impaired     Grooming: Bed level;Maximal assistance  Grooming Details (indicate cue type and reason): Pt wiped face with Max As using LT UE support to elbow and wrist and assisting with ROM to shoulder to reach face/eyes. Max As for oral care with swab to suction, placed in pt's LT hand with pt able to maintain grip and assisted to mouth.                                     Extremity/Trunk Assessment Upper Extremity Assessment RUE Deficits / Details: Hypotonic through shoulder, mild ton to bicep with quick stretch, wrist flaccid, able to open hand partially ~2/3 way and close hand for composite grip. LUE Deficits / Details: Dystonic, hand with hyperextension to MPs and PIPs. Shoulder ~2+/5            Vision   Vision Assessment?: Vision impaired- to be further tested in functional context Additional Comments: Pt head/gaze with RT preference. Very limited neck rotation to LT but can reach neutral after slow stretches. Cues throughout session to attend to LT UE /visual attention to all activities. as pt often closes eyes.   Perception Perception Perception: Impaired Inattention/Neglect: Does not attend to left visual field;Other (comment) (Needs cues to attend to LT side of body)   Praxis Praxis Praxis: Impaired Praxis Impairment Details: Motor planning    Cognition Arousal/Alertness: Awake/alert Behavior During Therapy: WFL for tasks assessed/performed Overall Cognitive Status: History of cognitive impairments - at baseline                                 General Comments: Responds well to repitition, 1-step slow commands.        Exercises Other Exercises Other Exercises: RT PROM shoulder, AAROM elbow flex/ext in gravity eliminated, PROM wrist, AROM hand all x 10 reps. LT shoulder AAROM with 10 sec hold at 90 degrees. Then continued x10 reps without hold. Elbow->Grip: AAROM for cuing as pt easily confused by exercises. Pt worked on bed level LT UE reaching for deoderant bottle with good grip but very slow to extend digits, at times requiring assistance.  Bridning exercises as mentioned. Also see Sitting balance.    Shoulder Instructions       General Comments      Pertinent Vitals/ Pain       Pain Assessment Pain Assessment: Faces Faces Pain Scale: Hurts a little bit Pain Location: Facial grimacing with bed  mobility. Denied pain with all exercises and once EOB. Reported that NGT pain is "much better" Pain Descriptors / Indicators: Grimacing Pain Intervention(s): Monitored during session, Limited activity within patient's tolerance, Repositioned  Home Living                                          Prior Functioning/Environment              Frequency  Min 2X/week        Progress Toward Goals  OT Goals(current goals can now be found in the care plan section)  Progress towards OT goals: Progressing toward goals  Acute Rehab OT Goals Patient Stated Goal: "Get stronger" OT Goal Formulation: With patient Time For Goal Achievement: 10/13/22 Potential to Achieve Goals: Good  Plan Discharge plan remains appropriate    Co-evaluation  AM-PAC OT "6 Clicks" Daily Activity     Outcome Measure   Help from another person eating meals?: Total Help from another person taking care of personal grooming?: A Lot Help from another person toileting, which includes using toliet, bedpan, or urinal?: A Lot Help from another person bathing (including washing, rinsing, drying)?: A Lot Help from another person to put on and taking off regular upper body clothing?: A Lot Help from another person to put on and taking off regular lower body clothing?: A Lot 6 Click Score: 11    End of Session    OT Visit Diagnosis: Unsteadiness on feet (R26.81);Muscle weakness (generalized) (M62.81);Ataxia, unspecified (R27.0);Other symptoms and signs involving cognitive function;Hemiplegia and hemiparesis Hemiplegia - Right/Left: Right Hemiplegia - dominant/non-dominant: Dominant Hemiplegia - caused by: Cerebral infarction   Activity Tolerance Patient tolerated treatment well   Patient Left in bed;with call bell/phone within reach;with bed alarm set   Nurse Communication Other (comment) (Pt requesting water. RN assisted with scooting pt up in bed.)        Time:  4562-5638 OT Time Calculation (min): 39 min  Charges: OT General Charges $OT Visit: 1 Visit OT Treatments $Therapeutic Activity: 8-22 mins $Neuromuscular Re-education: 8-22 mins $Therapeutic Exercise: 8-22 mins  Anderson Malta, OT Acute Rehab Services Office: [phone] 10/01/2022  Julien Girt 10/01/2022, 2:45 PM

## 2022-10-01 NOTE — Progress Notes (Signed)
Modified Barium Swallow Progress Note  Patient Details  Name: [name] MRN: [id number] Date of Birth: 11-25-1945  Today's Date: 10/01/2022  Modified Barium Swallow completed.  Full report located under Chart Review in the Imaging Section.  Brief recommendations include the following:  Clinical Impression  Pt demonstrates mild to moderate oral dysphagia related to right lingual and labial impairment. There is slow oral manipulation and mild right sided residue (worsened if tilted right). Pt is able to masticate a solid during assessment, but would need some practice to advance to textured solids (clearing residue, following solids with liquids, positioning upright). Pt has delayed swallow initiation with liquids, aspirating nectar thick liquids before the swallow regardless of bolus size. Pts sensation is variable; she coughs with the first episode, but cough is weak. Further episodes of aspiration  are silent. Recommend initiating puree and honey thick liquids with potential to uprgrade and awareness and sensation improve. Recommend f/u with [facility].   Swallow Evaluation Recommendations       SLP Diet Recommendations: Dysphagia 1 (Puree) solids;Honey thick liquids   Liquid Administration via: Cup;Spoon   Medication Administration: Crushed with puree   Supervision: Full assist for feeding   Compensations: Slow rate;Small sips/bites;Follow solids with liquid;Lingual sweep for clearance of pocketing   Postural Changes: Remain semi-upright after after feeds/meals (Comment)          [name], MA CCC-SLP  Acute Rehabilitation Services Secure Chat Preferred Office [phone]   Lynann Beaver 10/01/2022,11:15 AM

## 2022-10-01 NOTE — Progress Notes (Signed)
Physical Therapy Treatment Patient Details Name: [name] MRN: [id number] DOB: [birth year] Today's Date: 10/01/2022   History of Present Illness 76 yo female with onset of stroke-like symptoms was admitted on 11/21, now found to have many small scattered acute or early subacute infarcts throughout the infratentorial and supratentorial brain, probably embolic. PMHx:  encephalomalacia, subarachnoid hemorrhage overlying the left greater than right parietaoccipital lobes, post mechanical mitral valve replacement, hemiparesis, HTN, HLD, DM, PNA    PT Comments    Pt received supine and agreeable to session, however limited secondary to nare pain from recent coretrack placement with py tearful in sitting. Pt able to come to sitting EOB with total assist and maintain sitting with max assist as pt with posterior lean and unable to find midline with max assist and multimodal cues to maintain sitting balance. Pt returned to supine with pt endorsing relief from pain. Pt continues to benefit from skilled PT services to progress toward functional mobility goals.    Recommendations for follow up therapy are one component of a multi-disciplinary discharge planning process, led by the attending physician.  Recommendations may be updated based on patient status, additional functional criteria and insurance authorization.  Follow Up Recommendations  Skilled nursing-short term rehab (<3 hours/day) Can patient physically be transported by private vehicle: No   Assistance Recommended at Discharge Frequent or constant Supervision/Assistance  Patient can return home with the following A lot of help with walking and/or transfers;A lot of help with bathing/dressing/bathroom;Direct supervision/assist for medications management;Direct supervision/assist for financial management   Equipment Recommendations  None recommended by PT    Recommendations for Other Services       Precautions / Restrictions  Precautions Precautions: Fall Precaution Comments: Watch HR, coretrack Restrictions Weight Bearing Restrictions: No     Mobility  Bed Mobility Overal bed mobility: Needs Assistance Bed Mobility: Supine to Sit, Sit to Supine     Supine to sit: Max assist Sit to supine: Max assist   General bed mobility comments: total support as pt is fairly weak on R side,    Transfers Overall transfer level: Needs assistance                 General transfer comment: deferred secondary to nare pain and pt crying    Ambulation/Gait               General Gait Details: deferred   Stairs             Wheelchair Mobility    Modified Rankin (Stroke Patients Only)       Balance Overall balance assessment: Needs assistance Sitting-balance support: Feet supported Sitting balance-Leahy Scale: Poor Sitting balance - Comments: stong posterior lean with pt unable to achieve sitting balance, limited time EOB as pt crying from coretrack pain Postural control: Posterior lean                                  Cognition Arousal/Alertness: Awake/alert Behavior During Therapy: Anxious Overall Cognitive Status: History of cognitive impairments - at baseline                                 General Comments: requires repetitive instructions for mobility and very weak        Exercises Other Exercises Other Exercises: heel slides, AP, glute sets in supine    General Comments  Pertinent Vitals/Pain Pain Assessment Pain Assessment: Faces Pain Score: 10-Worst pain ever Pain Location: R nare from recent coretrack placement Pain Descriptors / Indicators: Crying Pain Intervention(s): Limited activity within patient's tolerance, Monitored during session, Repositioned    Home Living                          Prior Function            PT Goals (current goals can now be found in the care plan section) Acute Rehab PT Goals PT  Goal Formulation: With patient Time For Goal Achievement: 10/13/22    Frequency    Min 3X/week      PT Plan      Co-evaluation              AM-PAC PT "6 Clicks" Mobility   Outcome Measure  Help needed turning from your back to your side while in a flat bed without using bedrails?: A Lot Help needed moving from lying on your back to sitting on the side of a flat bed without using bedrails?: A Lot Help needed moving to and from a bed to a chair (including a wheelchair)?: Total Help needed standing up from a chair using your arms (e.g., wheelchair or bedside chair)?: Total Help needed to walk in hospital room?: Total Help needed climbing 3-5 steps with a railing? : Total 6 Click Score: 8    End of Session   Activity Tolerance: Patient limited by pain Patient left: in bed;with call bell/phone within reach;with bed alarm set;with family/visitor present Nurse Communication: Mobility status PT Visit Diagnosis: Muscle weakness (generalized) (M62.81);Other abnormalities of gait and mobility (R26.89);Difficulty in walking, not elsewhere classified (R26.2)     Time: 3086-5784 PT Time Calculation (min) (ACUTE ONLY): 18 min  Charges:  $Therapeutic Activity: 8-22 mins                     Tiaria Biby R. PTA Acute Rehabilitation Services Office: [place] 10/01/2022, 12:54 PM

## 2022-10-02 DIAGNOSIS — Z7189 Other specified counseling: Secondary | ICD-10-CM | POA: Diagnosis not present

## 2022-10-02 DIAGNOSIS — I639 Cerebral infarction, unspecified: Secondary | ICD-10-CM | POA: Diagnosis not present

## 2022-10-02 DIAGNOSIS — R1312 Dysphagia, oropharyngeal phase: Secondary | ICD-10-CM | POA: Diagnosis not present

## 2022-10-02 DIAGNOSIS — R638 Other symptoms and signs concerning food and fluid intake: Secondary | ICD-10-CM

## 2022-10-02 DIAGNOSIS — E876 Hypokalemia: Secondary | ICD-10-CM

## 2022-10-02 DIAGNOSIS — Z7901 Long term (current) use of anticoagulants: Secondary | ICD-10-CM | POA: Diagnosis not present

## 2022-10-02 DIAGNOSIS — I4729 Other ventricular tachycardia: Secondary | ICD-10-CM

## 2022-10-02 DIAGNOSIS — R791 Abnormal coagulation profile: Secondary | ICD-10-CM | POA: Diagnosis not present

## 2022-10-02 DIAGNOSIS — Z515 Encounter for palliative care: Secondary | ICD-10-CM | POA: Diagnosis not present

## 2022-10-02 DIAGNOSIS — I10 Essential (primary) hypertension: Secondary | ICD-10-CM | POA: Diagnosis not present

## 2022-10-02 LAB — GLUCOSE, CAPILLARY
Glucose-Capillary: 126 mg/dL — ABNORMAL HIGH (ref 70–99)
Glucose-Capillary: 142 mg/dL — ABNORMAL HIGH (ref 70–99)
Glucose-Capillary: 148 mg/dL — ABNORMAL HIGH (ref 70–99)
Glucose-Capillary: 155 mg/dL — ABNORMAL HIGH (ref 70–99)
Glucose-Capillary: 163 mg/dL — ABNORMAL HIGH (ref 70–99)
Glucose-Capillary: 164 mg/dL — ABNORMAL HIGH (ref 70–99)
Glucose-Capillary: 175 mg/dL — ABNORMAL HIGH (ref 70–99)
Glucose-Capillary: 186 mg/dL — ABNORMAL HIGH (ref 70–99)

## 2022-10-02 LAB — COMPREHENSIVE METABOLIC PANEL
ALT: 10 U/L (ref 0–44)
AST: 14 U/L — ABNORMAL LOW (ref 15–41)
Albumin: 3.1 g/dL — ABNORMAL LOW (ref 3.5–5.0)
Alkaline Phosphatase: 72 U/L (ref 38–126)
Anion gap: 8 (ref 5–15)
BUN: 26 mg/dL — ABNORMAL HIGH (ref 8–23)
CO2: 23 mmol/L (ref 22–32)
Calcium: 9 mg/dL (ref 8.9–10.3)
Chloride: 107 mmol/L (ref 98–111)
Creatinine, Ser: 0.61 mg/dL (ref 0.44–1.00)
GFR, Estimated: >60 mL/min (ref >60)
Glucose, Bld: 169 mg/dL — ABNORMAL HIGH (ref 70–99)
Potassium: 3.3 mmol/L — ABNORMAL LOW (ref 3.5–5.1)
Sodium: 138 mmol/L (ref 135–145)
Total Bilirubin: 0.4 mg/dL (ref 0.3–1.2)
Total Protein: 5.6 g/dL — ABNORMAL LOW (ref 6.5–8.1)

## 2022-10-02 LAB — MAGNESIUM
Magnesium: 1.9 mg/dL (ref 1.7–2.4)
Magnesium: 2.2 mg/dL (ref 1.7–2.4)

## 2022-10-02 LAB — CBC
HCT: 35 % — ABNORMAL LOW (ref 36.0–46.0)
Hemoglobin: 11.3 g/dL — ABNORMAL LOW (ref 12.0–15.0)
MCH: 27 pg (ref 26.0–34.0)
MCHC: 32.3 g/dL (ref 30.0–36.0)
MCV: 83.7 fL (ref 80.0–100.0)
Platelets: 150 10*3/uL (ref 150–400)
RBC: 4.18 MIL/uL (ref 3.87–5.11)
RDW: 15.1 % (ref 11.5–15.5)
WBC: 6.1 10*3/uL (ref 4.0–10.5)
nRBC: 0 % (ref 0.0–0.2)

## 2022-10-02 LAB — PROTIME-INR
INR: 4.1 (ref 0.8–1.2)
Prothrombin Time: 39.4 seconds — ABNORMAL HIGH (ref 11.4–15.2)

## 2022-10-02 LAB — PHOSPHORUS
Phosphorus: 2.2 mg/dL — ABNORMAL LOW (ref 2.5–4.6)
Phosphorus: 1.8 mg/dL — ABNORMAL LOW (ref 2.5–4.6)

## 2022-10-02 MED ORDER — POTASSIUM CHLORIDE 20 MEQ PO PACK
40.0000 meq | PACK | Freq: Once | ORAL | Status: AC
  Administered 2022-10-02: 40 meq
  Filled 2022-10-02: qty 2

## 2022-10-02 MED ORDER — INSULIN ASPART 100 UNIT/ML IJ SOLN
0.0000 [IU] | INTRAMUSCULAR | Status: DC
  Administered 2022-10-02: 3 [IU] via SUBCUTANEOUS
  Administered 2022-10-02: 2 [IU] via SUBCUTANEOUS
  Administered 2022-10-02: 3 [IU] via SUBCUTANEOUS
  Administered 2022-10-02 – 2022-10-03 (×3): 2 [IU] via SUBCUTANEOUS
  Administered 2022-10-03: 3 [IU] via SUBCUTANEOUS
  Administered 2022-10-03 (×3): 2 [IU] via SUBCUTANEOUS
  Administered 2022-10-04 (×6): 3 [IU] via SUBCUTANEOUS
  Administered 2022-10-05 (×3): 2 [IU] via SUBCUTANEOUS
  Administered 2022-10-05 – 2022-10-06 (×4): 3 [IU] via SUBCUTANEOUS
  Administered 2022-10-06: 2 [IU] via SUBCUTANEOUS
  Administered 2022-10-06: 3 [IU] via SUBCUTANEOUS
  Administered 2022-10-06 (×3): 2 [IU] via SUBCUTANEOUS
  Administered 2022-10-07: 5 [IU] via SUBCUTANEOUS
  Administered 2022-10-07: 2 [IU] via SUBCUTANEOUS

## 2022-10-02 MED ORDER — ASPIRIN 81 MG PO CHEW
81.0000 mg | CHEWABLE_TABLET | Freq: Every day | ORAL | Status: DC
  Administered 2022-10-02 – 2022-10-06 (×5): 81 mg
  Filled 2022-10-02 (×6): qty 1

## 2022-10-02 MED ORDER — METOPROLOL TARTRATE 12.5 MG HALF TABLET
12.5000 mg | ORAL_TABLET | Freq: Two times a day (BID) | ORAL | Status: DC
  Administered 2022-10-02 – 2022-10-06 (×9): 12.5 mg
  Filled 2022-10-02 (×11): qty 1

## 2022-10-02 MED ORDER — POTASSIUM CHLORIDE 20 MEQ PO PACK: 40.0000 meq | PACK | Freq: Once | ORAL | Status: DC

## 2022-10-02 MED ORDER — PAROXETINE HCL 10 MG PO TABS
10.0000 mg | ORAL_TABLET | Freq: Every morning | ORAL | Status: DC
  Administered 2022-10-02 – 2022-10-07 (×6): 10 mg
  Filled 2022-10-02 (×6): qty 1

## 2022-10-02 MED ORDER — METHOCARBAMOL 500 MG PO TABS: 500.0000 mg | ORAL_TABLET | Freq: Four times a day (QID) | ORAL | Status: DC | PRN

## 2022-10-02 MED ORDER — FAMOTIDINE 40 MG/5ML PO SUSR
20.0000 mg | Freq: Two times a day (BID) | ORAL | Status: DC
  Administered 2022-10-02 – 2022-10-06 (×10): 20 mg
  Filled 2022-10-02 (×11): qty 2.5

## 2022-10-02 MED ORDER — MAGNESIUM SULFATE 2 GM/50ML IV SOLN
2.0000 g | Freq: Once | INTRAVENOUS | Status: AC
  Administered 2022-10-02: 2 g via INTRAVENOUS
  Filled 2022-10-02: qty 50

## 2022-10-02 MED ORDER — ATORVASTATIN CALCIUM 40 MG PO TABS
40.0000 mg | ORAL_TABLET | Freq: Every day | ORAL | Status: DC
  Administered 2022-10-02 – 2022-10-06 (×5): 40 mg
  Filled 2022-10-02 (×6): qty 1

## 2022-10-02 NOTE — Progress Notes (Signed)
Lab called to report Critical INR of 4.1, MD notified no new orders at this time

## 2022-10-02 NOTE — Progress Notes (Signed)
ANTICOAGULATION CONSULT NOTE - Follow Up Consult  Pharmacy Consult for Warfarin Indication:  MVR  Allergies  Allergen Reactions   Zoloft [Sertraline] Hives, Itching and Rash    Patient Measurements: Height: '5\' 6"'$  (167.6 cm) Weight: 50.3 kg (110 lb 14.3 oz) IBW/kg (Calculated) : 59.3  Vital Signs: Temp: 99 F (37.2 C) (11/25 0826) Temp Source: Oral (11/25 0826) BP: 126/79 (11/25 0826) Pulse Rate: 93 (11/25 0826)  Labs: Recent Labs    09/30/22 0222 10/01/22 0238 10/02/22 0708  HGB 11.4* 11.0* 11.3*  HCT 34.7* 34.8* 35.0*  PLT 161 146* 150  LABPROT 32.8* 38.1* 39.4*  INR 3.2* 3.9* 4.1*  CREATININE 0.69  --  0.61    Estimated Creatinine Clearance: 47.5 mL/min (by C-G formula based on SCr of 0.61 mg/dL).   Medications:  Scheduled:    stroke: early stages of recovery book   Does not apply Once   aspirin  81 mg Per Tube Daily   atorvastatin  40 mg Per Tube Daily   famotidine  20 mg Per Tube BID   feeding supplement (PROSource TF20)  60 mL Per Tube Daily   free water  200 mL Per Tube Q8H   insulin aspart  0-15 Units Subcutaneous Q4H   metoprolol tartrate  12.5 mg Per Tube BID   PARoxetine  10 mg Per Tube q AM   potassium chloride  40 mEq Per Tube Once   Warfarin - Pharmacist Dosing Inpatient   Does not apply q1600   Infusions:   feeding supplement (JEVITY 1.5 CAL/FIBER) 25 mL/hr at 10/02/22 0100    Assessment: 76 yo F presenting with stroke sx, hx of MVR on warfarin PTA with last dose 11/20. INR on admission supratherapeutic at 4.3. Per medication history obtained from SNF - patient's last PTA dose of warfarin was 10 mg given on 11/20 at ~1700.  Patient received one dose of Warfarin 10 mg on 11/23. Warfarin was held on 11/24 due to supratherapeutic INR (3.9).   Todays INR is supratherapeutic at 4.1. Hgb 11.3, plt 150 - stable. No signs/symptoms of bleeding noted per RN.  PTA dosing: Alternating 10 mg with 5 mg daily  Goal of Therapy:  INR 2.5-3.5 Monitor  platelets by anticoagulation protocol: Yes   Plan:  Hold Warfarin tonight.  Continue daily INR.  Monitor CBC and for signs/symptoms of bleeding.  [place] Intern

## 2022-10-02 NOTE — Progress Notes (Signed)
Daily Progress Note   Patient Name: [name]       Date: 10/02/2022 DOB: Dec 04, 1945  Age: 76 y.o. MRN#: [id number] Attending Physician: Bonnielee Haff, MD Primary Care Physician: Haywood Pao, MD Admit Date: 09/27/2022  Reason for Consultation/Follow-up: Establishing goals of care  Subjective: Chart review performed.  Received report from primary RN -no acute concerns.  Went to visit patient at bedside - husband/Robert present. Patient was lying in bed awake, alert, oriented, and able to participate in conversation. She remains dysarthric; hard to understand at times. No signs or non-verbal gestures of pain or discomfort noted. No respiratory distress, increased work of breathing, or secretions noted. She denies pain. Patient is tired and weak appearing.  She endorses feeling hungry and requests assistance with eating her breakfast -call the utilized to notify staff of her request.  Repositioned patient in bed as she was leaning to the right.  Emotional support provided to patient and her husband.  So far patient has been tolerating her diet well; however, intake remains minimal.  Patient tells me she had yogurt for dinner and "it was great" while smiling and laughing.   Patient is tearful as she expresses concern for her husband.  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  All questions and concerns addressed. Encouraged to call with questions and/or concerns. PMT card previously provided.  Length of Stay: 4  Current Medications: Scheduled Meds:    stroke: early stages of recovery book   Does not apply Once   aspirin  81 mg Per Tube Daily   atorvastatin  40 mg Per Tube Daily   famotidine  20 mg Per Tube BID   feeding supplement  (PROSource TF20)  60 mL Per Tube Daily   free water  200 mL Per Tube Q8H   insulin aspart  0-15 Units Subcutaneous Q4H   metoprolol tartrate  12.5 mg Per Tube BID   PARoxetine  10 mg Per Tube q AM   potassium chloride  40 mEq Per Tube Once   potassium chloride  40 mEq Per Tube Once   Warfarin - Pharmacist Dosing Inpatient   Does not apply q1600    Continuous Infusions:  feeding supplement (JEVITY 1.5 CAL/FIBER) 25 mL/hr at 10/02/22 0100   magnesium sulfate bolus IVPB  PRN Meds: acetaminophen **OR** acetaminophen (TYLENOL) oral liquid 160 mg/5 mL **OR** acetaminophen, bisacodyl, methocarbamol  Physical Exam Vitals and nursing note reviewed.  Constitutional:      General: She is not in acute distress.    Appearance: She is ill-appearing.  Pulmonary:     Effort: No respiratory distress.  Skin:    General: Skin is warm and dry.  Neurological:     Mental Status: She is alert and oriented to person, place, and time.     Motor: Weakness present.  Psychiatric:        Attention and Perception: Attention normal.        Behavior: Behavior is cooperative.        Cognition and Memory: Cognition and memory normal.             Vital Signs: BP 126/79 (BP Location: Right Arm)   Pulse 93   Temp 99 F (37.2 C) (Oral)   Resp 17   Ht '5\' 6"'$  (1.676 m)   Wt 50.3 kg   SpO2 97%   BMI 17.90 kg/m  SpO2: SpO2: 97 % O2 Device: O2 Device: Room Air O2 Flow Rate:    Intake/output summary:  Intake/Output Summary (Last 24 hours) at 10/02/2022 1050 Last data filed at 10/02/2022 6767 Gross per 24 hour  Intake 904 ml  Output 650 ml  Net 254 ml   LBM: Last BM Date : 10/01/22 Baseline Weight: Weight: 53.6 kg Most recent weight: Weight: 50.3 kg       Palliative Assessment/Data: PPS 30% with tube feeds      Patient Active Problem List   Diagnosis Date Noted   Protein-calorie malnutrition, severe 10/01/2022   DNR (do not resuscitate) 09/27/2022   AMS (altered mental status)  08/28/2022   Constipation 08/28/2022   Gastritis 08/28/2022   Subarachnoid hemorrhage ([place]) 04/30/2022   SAH (subarachnoid hemorrhage) ([place]) 04/26/2022   Goals of care, counseling/discussion    Palliative care by specialist    Acute CVA (cerebrovascular accident) ([place]) 07/01/2020   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   Anticoagulated on warfarin    Left hemiparesis ([place])    History of CVA with residual deficit    Thrombocytopenia ([place])    Prediabetes    Vertigo 09/24/2017   Benign paroxysmal positional vertigo    History of stroke    HLD (hyperlipidemia) 08/23/2017   Encounter for therapeutic drug monitoring 08/08/2017   Long term (current) use of anticoagulants [Z79.01] 07/12/2017   Hemiparesis and alteration of sensations as late effects of stroke ([place]) 09/07/2016   Abnormality of gait 09/07/2016   HTN (hypertension) 04/13/2016   H/O mitral valve replacement with mechanical valve 10/16/2015   Diabetes mellitus without complication (HCC)    Hemiparesis (L sided - mild) due to old stroke [facility])     Palliative Care Assessment & Plan   Patient Profile: 76 y.o. female  with past medical history of  DM, multiple CVAs with hemiparesis, MVR on Coumadin, and HTN presented to ED on 09/11/2022 from [facility] with stroke-like symptoms - left sided gaze, right sided weakness. Patient was admitted on 09/24/2022 with acute CVA.    Of note, patient has had 3 hospital admissions in the last 6 months and is a 30 day readmission.  Assessment: Principal Problem:   Acute CVA (cerebrovascular accident) ([place]) Active Problems:   Diabetes mellitus without complication ([place])   H/O mitral valve replacement with mechanical valve   HTN (hypertension)   HLD (hyperlipidemia)   DNR (do not resuscitate)  Protein-calorie malnutrition, severe     Recommendations/Plan: Continue full scope and allow time for outcomes Patient's appetite is improving though intake remains minimal; she is tolerating  Dys 1 diet - continue trial of coretrak Continue DNR/DNI as previously documented Patient is not sure she would want her life prolonged with PEG Ongoing GOC pending clinical course PMT will continue to follow and support holistically  Goals of Care and Additional Recommendations: Limitations on Scope of Treatment: Full Scope Treatment and No Tracheostomy  Code Status:    Code Status Orders  (From admission, onward)           Start     Ordered   09/22/2022 1152  Do not attempt resuscitation (DNR)  Continuous       Question Answer Comment  In the event of cardiac or respiratory ARREST Do not call a "code blue"   In the event of cardiac or respiratory ARREST Do not perform Intubation, CPR, defibrillation or ACLS   In the event of cardiac or respiratory ARREST Use medication by any route, position, wound care, and other measures to relive pain and suffering. May use oxygen, suction and manual treatment of airway obstruction as needed for comfort.      09/11/2022 1153           Code Status History     Date Active Date Inactive Code Status Order ID Comments User Context   08/28/2022 0136 08/30/2022 2248 DNR [id number]  Ileene Musa T, DO ED   04/30/2022 1614 06/01/2022 1519 Full Code [id number]  Cathlyn Parsons, PA-C Inpatient   04/30/2022 1614 04/30/2022 1614 Full Code [id number]  Cathlyn Parsons, PA-C Inpatient   04/26/2022 1535 04/30/2022 1611 Full Code [id number]  Lequita Halt, MD ED   07/04/2020 1511 07/16/2020 1649 Full Code [id number]  Cathlyn Parsons, PA-C Inpatient   07/04/2020 1511 07/04/2020 1511 Full Code [id number]  Cathlyn Parsons, PA-C Inpatient   07/02/2020 0253 07/04/2020 1503 Full Code [id number]  Chotiner, Yevonne Aline, MD ED   09/05/2019 1625 09/15/2019 1404 Full Code [id number]  Cathlyn Parsons, PA-C Inpatient   09/05/2019 1625 09/05/2019 1625 Full Code [id number]  Cathlyn Parsons, PA-C Inpatient   09/03/2019 0932 09/05/2019 1612 Full Code [id number]  Norval Morton, MD  ED   09/24/2017 0352 09/24/2017 2226 Full Code [id number]  Etta Quill, DO ED       Prognosis:  Unable to determine  Discharge Planning: To Be Determined  Care plan was discussed with primary RN, patient, patient's husband  Thank you for allowing the Palliative Medicine Team to assist in the care of this patient.   Total Time 40 minutes Prolonged Time Billed  no       Greater than 50%  of this time was spent counseling and coordinating care related to the above assessment and plan.  Lin Landsman, NP  Please contact Palliative Medicine Team phone at [phone] for questions and concerns.   *Portions of this note are a verbal dictation therefore any spelling and/or grammatical errors are due to the "[place]" system interpretation.

## 2022-10-02 NOTE — Progress Notes (Signed)
[facility] PROGRESS NOTE   [name] FFM:[id number] DOB: 14-Apr-1946 DOA: 10/04/2022  PCP: Haywood Pao, MD  Brief History/Interval Summary: 76 y.o. 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      Consultants: Neurology.  Palliative care  Procedures: Transthoracic echocardiogram    Subjective/Interval History: Patient speech is improving.  She denies any complaints this morning.  Husband is not at the bedside this morning.      Assessment/Plan:  Acute stroke MRI raised concern for embolic stroke. Patient was already on warfarin.  Was not given TPA. Seen by neurology.  Patient started on aspirin. Echocardiogram shows normal systolic function.  Moderate pericardial effusion was noted. LDL is 81.  HbA1c is 6.2. Patient on statin. PT and OT following.  Skilled nursing facility is recommended for rehab. Speech therapy is following.  Looks like diet has been advanced as of yesterday.  Hopefully this is a good sign and she will continue to show improvement. No further workup with neurology.  Continue warfarin along with aspirin.  Oropharyngeal dysphagia Speech therapy is following.  Cleared for oral intake yesterday.  Hopefully this is a good sign.  Cortrak was placed yesterday.  Continue tube feedings for now.   Patient and family not sure about PEG tube if needed.  Moderate pericardial effusion Noted on echocardiogram.  No tamponade physiology.  Previous echocardiogram from 2021 showed small pericardial effusion.  TSH noted to be 1.32.  Free T41.15. Patient followed by Dr. Acie Fredrickson with cardiology. Depending on how patient does over the next 1 to 2 weeks it may be reasonable for her to follow-up with her cardiologist for repeat echocardiogram in a few weeks time.  Essential hypertension Allowing permissive hypertension.  Amlodipine on  hold.  Nonsustained ventricular tachycardia Replace potassium.  Magnesium is 1.9.  Low-dose beta-blocker initiated.  Echocardiogram showed normal left ventricular systolic function.  Hyperlipidemia Continue statin  History of mitral valve replacement On warfarin prior to admission.  Pharmacy is managing.  Goal INR is between 2.5-3.5.  Noted to be 4.1 today.  No evidence for bleeding.  Normocytic anemia/mild thrombocytopenia No evidence of overt bleeding.  Continue to monitor periodically.  Diabetes mellitus type 2 HbA1c 6.2.  Started on sliding scale coverage as she is now on tube feedings.    Goals of care Lytic care is following.  Patient is DNR.  Hopefully her dysphagia will improve and we may not need to proceed to artificial nutrition for too long.  DVT Prophylaxis: On warfarin Code Status: DNR Family Communication: Husband was not at the bedside today. Disposition Plan: Skilled nursing facility recommended by PT.  Status is: Inpatient Remains inpatient appropriate because: Acute stroke      Medications: Scheduled:   stroke: early stages of recovery book   Does not apply Once   aspirin  81 mg Per Tube Daily   atorvastatin  40 mg Per Tube Daily   famotidine  20 mg Per Tube BID   feeding supplement (PROSource TF20)  60 mL Per Tube Daily   free water  200 mL Per Tube Q8H   insulin aspart  0-15 Units Subcutaneous Q4H   metoprolol tartrate  12.5 mg Per Tube BID   PARoxetine  10 mg Per Tube q AM   potassium chloride  40 mEq Per Tube Once   Warfarin - Pharmacist Dosing Inpatient   Does not apply q1600   Continuous:  feeding supplement (JEVITY 1.5 CAL/FIBER) 25 mL/hr at 10/02/22 0100   WVP:XTGGYIRSWNIOE **OR** acetaminophen (TYLENOL) oral liquid 160 mg/5 mL **OR** acetaminophen, bisacodyl, methocarbamol  Antibiotics: Anti-infectives (From admission, onward)    None       Objective:  Vital Signs  Vitals:   10/02/22 0022 10/02/22 0353 10/02/22 0427 10/02/22  0826  BP: 130/81 (!) 144/86  126/79  Pulse: 100 91  93  Resp: (!) '22 20  17  '$ Temp: 99.1 F (37.3 C) 98.7 F (37.1 C)  99 F (37.2 C)  TempSrc: Oral Oral  Oral  SpO2: 96% 97%  97%  Weight:   50.3 kg   Height:        Intake/Output Summary (Last 24 hours) at 10/02/2022 0956 Last data filed at 10/02/2022 7035 Gross per 24 hour  Intake 904 ml  Output 650 ml  Net 254 ml    Filed Weights   09/20/2022 0900 10/01/22 0500 10/02/22 0427  Weight: 53.6 kg 50.2 kg 50.3 kg    General appearance: Awake alert.  In no distress NG tube is noted Resp: Clear to auscultation bilaterally.  Normal effort Cardio: S1-S2 is normal regular.  No S3-S4.  No rubs murmurs or bruit GI: Abdomen is soft.  Nontender nondistended.  Bowel sounds are present normal.  No masses organomegaly Extremities: No edema.  Physical deconditioning is noted.   Lab Results:  Data Reviewed: I have personally reviewed following labs and reports of the imaging studies  CBC: Recent Labs  Lab 10/03/2022 0926 09/27/2022 0931 09/30/22 0222 10/01/22 0238 10/02/22 0708  WBC 5.4  --  6.1 4.9 6.1  NEUTROABS 3.9  --   --   --   --   HGB 11.6* 12.9 11.4* 11.0* 11.3*  HCT 35.6* 38.0 34.7* 34.8* 35.0*  MCV 83.0  --  83.4 85.1 83.7  PLT 172  --  161 146* 150     Basic Metabolic Panel: Recent Labs  Lab 10/07/2022 0926 09/10/2022 0931 09/30/22 0222 10/01/22 0238 10/01/22 1747 10/02/22 0708  NA 137 139 141  --   --  138  K 3.8 4.1 3.5  --   --  3.3*  CL 101 102 107  --   --  107  CO2 27  --  25  --   --  23  GLUCOSE 146* 157* 97  --   --  169*  BUN 21 25* 20  --   --  26*  CREATININE 0.79 0.70 0.69  --   --  0.61  CALCIUM 9.0  --  9.3  --   --  9.0  MG  --   --   --  1.7 1.7 1.9  PHOS  --   --   --  3.3 2.6 2.2*     GFR: Estimated Creatinine Clearance: 47.5 mL/min (by C-G formula based on SCr of 0.61 mg/dL).  Liver Function Tests: Recent Labs  Lab 09/27/2022 0926 09/30/22 0222 10/02/22 0708  AST 16 16 14*  ALT  '12 13 10  '$ ALKPHOS 78 79 72  BILITOT 0.2* 0.9 0.4  PROT 5.7* 5.8* 5.6*  ALBUMIN 3.1* 3.3* 3.1*      Coagulation Profile: Recent Labs  Lab 09/21/2022 0926 09/29/22 0324 09/30/22 0222 10/01/22 0238 10/02/22 0708  INR 4.3* 3.2* 3.2* 3.9* 4.1*     CBG: Recent Labs  Lab 10/01/22 1633 10/01/22 2105 10/02/22 0019 10/02/22 0451 10/02/22 0924  GLUCAP 111* 201* 155* 186* 175*      Radiology  Studies: DG Swallowing Func-Speech Pathology  Result Date: 10/01/2022 Table formatting from the original result was not included. Images from the original result were not included. Objective Swallowing Evaluation: Type of Study: MBS-Modified Barium Swallow Study  Patient Details Name: BRIANAH HOPSON MRN: [id number] Date of Birth: 04-Jun-1946 Today's Date: 10/01/2022 Time: SLP Start Time (ACUTE ONLY): 1010 -SLP Stop Time (ACUTE ONLY): 1022 SLP Time Calculation (min) (ACUTE ONLY): 12 min Past Medical History: Past Medical History: Diagnosis Date  Abnormality of gait 09/07/2016  Allergy   Diabetes mellitus without complication ([place])   Patient denies this - notes history of glucose intolerance  GERD (gastroesophageal reflux disease)   Hemiparesis and alteration of sensations as late effects of stroke ([place]) 09/07/2016  History of pneumonia 1997  Hypertension   S/P MVR (mitral valve replacement)   Mechanical mitral valve replacement at age 68 (done in [place])  // echo 7/17: EF 55-60, normal wall motion, bileaflet mechanical mitral valve prosthesis functioning normally, mild LAE, mildly reduced RVSF, small pericardial effusion  Stroke ([place]) 1997, 2013, 2015 Past Surgical History: Past Surgical History: Procedure Laterality Date  ABDOMINAL HYSTERECTOMY    BRAIN SURGERY    BUNIONECTOMY  1993  CARDIAC VALVE REPLACEMENT  1997  TOE SURGERY  2140 HPI: 76 year old female with acute onset right hemiplegia with left gaze and relatively preserved language.Thalamic stroke suspected by neurologist given field cut,  hemiparesis, gaze palsy, but no MRI yet (pt evaluated in ED). Multiple strokes (1997, 2013, 2015, 2017). Has been in decline, per husband since 06/2022 and presently resides in SNF.  Subjective: alert, upright in chair with spouse present  Recommendations for follow up therapy are one component of a multi-disciplinary discharge planning process, led by the attending physician.  Recommendations may be updated based on patient status, additional functional criteria and insurance authorization. Assessment / Plan / Recommendation   10/01/2022  10:00 AM Clinical Impressions Clinical Impression Pt demonstrates mild to moderate oral dysphagia related to right lingual and labial impairment. There is slow oral manipulation and mild right sided residue (worsened if tilted right). Pt is able to masticate a solid during assessment, but would need some practice to advance to textured solids (clearing residue, following solids with liquids, positioning upright). Pt has delayed swallow initiation with liquids, aspirating nectar thick liquids before the swallow regardless of bolus size. Pts sensation is variable; she coughs with the first episode, but cough is weak. Further episodes of aspiration  are silent. Recommend initiating puree and honey thick liquids with potential to uprgrade and awareness and sensation improve. Recommend f/u with AIR. SLP Visit Diagnosis Dysphagia, oropharyngeal phase (R13.12) Impact on safety and function Moderate aspiration risk;Risk for inadequate nutrition/hydration     10/01/2022  10:00 AM Treatment Recommendations Treatment Recommendations Therapy as outlined in treatment plan below     10/01/2022  10:00 AM Prognosis Prognosis for Safe Diet Advancement Good   10/01/2022  10:00 AM Diet Recommendations SLP Diet Recommendations Dysphagia 1 (Puree) solids;Honey thick liquids Liquid Administration via Cup;Spoon Medication Administration Crushed with puree Compensations Slow rate;Small sips/bites;Follow  solids with liquid;Lingual sweep for clearance of pocketing Postural Changes Remain semi-upright after after feeds/meals (Comment)     10/01/2022  10:00 AM Other Recommendations Follow Up Recommendations Acute inpatient rehab (3hours/day) Functional Status Assessment Patient has had a recent decline in their functional status and demonstrates the ability to make significant improvements in function in a reasonable and predictable amount of time.   10/01/2022  10:00 AM Frequency and Duration  Speech Therapy  Frequency (ACUTE ONLY) min 2x/week Treatment Duration 2 weeks     10/01/2022  10:00 AM Oral Phase Oral Phase Impaired Oral - Honey Teaspoon Weak lingual manipulation;Delayed oral transit;Decreased bolus cohesion;Right pocketing in lateral sulci;Lingual/palatal residue Oral - Nectar Teaspoon Weak lingual manipulation;Delayed oral transit;Decreased bolus cohesion;Right pocketing in lateral sulci;Lingual/palatal residue Oral - Nectar Cup Weak lingual manipulation;Delayed oral transit;Decreased bolus cohesion;Right pocketing in lateral sulci;Lingual/palatal residue Oral - Nectar Straw Weak lingual manipulation;Delayed oral transit;Decreased bolus cohesion;Right pocketing in lateral sulci;Lingual/palatal residue Oral - Puree Weak lingual manipulation;Delayed oral transit;Decreased bolus cohesion;Right pocketing in lateral sulci;Lingual/palatal residue Oral - Mech Soft Weak lingual manipulation;Delayed oral transit;Decreased bolus cohesion;Right pocketing in lateral sulci;Lingual/palatal residue;Impaired mastication Oral - Pill Weak lingual manipulation;Delayed oral transit;Decreased bolus cohesion;Right pocketing in lateral sulci;Lingual/palatal residue    10/01/2022  10:00 AM Pharyngeal Phase Pharyngeal Phase Impaired Pharyngeal- Honey Teaspoon Delayed swallow initiation-vallecula Pharyngeal- Honey Cup Delayed swallow initiation-vallecula Pharyngeal- Nectar Teaspoon Delayed swallow initiation-pyriform  sinuses;Penetration/Aspiration before swallow Pharyngeal Material enters airway, CONTACTS cords and then ejected out Pharyngeal- Nectar Cup Delayed swallow initiation-pyriform sinuses;Penetration/Aspiration before swallow;Trace aspiration Pharyngeal Material enters airway, passes BELOW cords without attempt by patient to eject out (silent aspiration) Pharyngeal- Nectar Straw Penetration/Aspiration before swallow;Delayed swallow initiation-pyriform sinuses Pharyngeal Material enters airway, passes BELOW cords without attempt by patient to eject out (silent aspiration) Pharyngeal- Puree Delayed swallow initiation-vallecula Pharyngeal- Mechanical Soft Delayed swallow initiation-vallecula Pharyngeal- Pill Delayed swallow initiation-vallecula;Pharyngeal residue - valleculae     No data to display    DeBlois, Katherene Ponto 10/01/2022, 11:15 AM                     DG Abd 1 View  Result Date: 10/01/2022 CLINICAL DATA:  Encounter for feeding tube placement EXAM: ABDOMEN - 1 VIEW COMPARISON:  CT 08/27/2022 FINDINGS: Feeding tube tip overlies the stomach. No evidence of bowel obstruction. Moderate stool burden. Contrast material is in the stomach and small bowel. IMPRESSION: Feeding tube tip overlies the stomach. Electronically Signed   By: Maurine Simmering M.D.   On: 10/01/2022 10:44       LOS: 4 days   [facility] Pager on [email or web address]  10/02/2022, 9:56 AM

## 2022-10-02 NOTE — Plan of Care (Signed)
  Problem: Education: Goal: Knowledge of disease or condition will improve Outcome: Progressing Goal: Knowledge of secondary prevention will improve (MUST DOCUMENT ALL) Outcome: Progressing Goal: Knowledge of patient specific risk factors will improve [place]ta [place] N/A or DELETE if not current risk factor) Outcome: Progressing   Problem: Ischemic Stroke/TIA Tissue Perfusion: Goal: Complications of ischemic stroke/TIA will be minimized Outcome: Progressing   Problem: Coping: Goal: Will verbalize positive feelings about self Outcome: Progressing Goal: Will identify appropriate support needs Outcome: Progressing   Problem: Health Behavior/Discharge Planning: Goal: Ability to manage health-related needs will improve Outcome: Progressing Goal: Goals will be collaboratively established with patient/family Outcome: Progressing   Problem: Self-Care: Goal: Ability to participate in self-care as condition permits will improve Outcome: Progressing Goal: Verbalization of feelings and concerns over difficulty with self-care will improve Outcome: Progressing Goal: Ability to communicate needs accurately will improve Outcome: Progressing   Problem: Nutrition: Goal: Risk of aspiration will decrease Outcome: Progressing Goal: Dietary intake will improve Outcome: Progressing   Problem: Education: Goal: Knowledge of General Education information will improve Description: Including pain rating scale, medication(s)/side effects and non-pharmacologic comfort measures Outcome: Progressing   Problem: Health Behavior/Discharge Planning: Goal: Ability to manage health-related needs will improve Outcome: Progressing   Problem: Clinical Measurements: Goal: Ability to maintain clinical measurements within normal limits will improve Outcome: Progressing Goal: Will remain free from infection Outcome: Progressing Goal: Diagnostic test results will improve Outcome: Progressing Goal: Respiratory  complications will improve Outcome: Progressing Goal: Cardiovascular complication will be avoided Outcome: Progressing   Problem: Activity: Goal: Risk for activity intolerance will decrease Outcome: Progressing   Problem: Nutrition: Goal: Adequate nutrition will be maintained Outcome: Progressing   Problem: Coping: Goal: Level of anxiety will decrease Outcome: Progressing   Problem: Elimination: Goal: Will not experience complications related to bowel motility Outcome: Progressing Goal: Will not experience complications related to urinary retention Outcome: Progressing   Problem: Pain Managment: Goal: General experience of comfort will improve Outcome: Progressing   Problem: Safety: Goal: Ability to remain free from injury will improve Outcome: Progressing   Problem: Skin Integrity: Goal: Risk for impaired skin integrity will decrease Outcome: Progressing   Problem: Education: Goal: Ability to describe self-care measures that may prevent or decrease complications (Diabetes Survival Skills Education) will improve Outcome: Progressing Goal: Individualized Educational Video(s) Outcome: Progressing   Problem: Coping: Goal: Ability to adjust to condition or change in health will improve Outcome: Progressing   Problem: Fluid Volume: Goal: Ability to maintain a balanced intake and output will improve Outcome: Progressing   Problem: Health Behavior/Discharge Planning: Goal: Ability to identify and utilize available resources and services will improve Outcome: Progressing Goal: Ability to manage health-related needs will improve Outcome: Progressing   Problem: Metabolic: Goal: Ability to maintain appropriate glucose levels will improve Outcome: Progressing

## 2022-10-02 NOTE — Plan of Care (Signed)

## 2022-10-03 DIAGNOSIS — R1312 Dysphagia, oropharyngeal phase: Secondary | ICD-10-CM | POA: Diagnosis not present

## 2022-10-03 DIAGNOSIS — Z66 Do not resuscitate: Secondary | ICD-10-CM | POA: Diagnosis not present

## 2022-10-03 DIAGNOSIS — Z7901 Long term (current) use of anticoagulants: Secondary | ICD-10-CM | POA: Diagnosis not present

## 2022-10-03 DIAGNOSIS — I639 Cerebral infarction, unspecified: Secondary | ICD-10-CM | POA: Diagnosis not present

## 2022-10-03 DIAGNOSIS — Z7189 Other specified counseling: Secondary | ICD-10-CM | POA: Diagnosis not present

## 2022-10-03 DIAGNOSIS — I10 Essential (primary) hypertension: Secondary | ICD-10-CM | POA: Diagnosis not present

## 2022-10-03 DIAGNOSIS — E43 Unspecified severe protein-calorie malnutrition: Secondary | ICD-10-CM

## 2022-10-03 DIAGNOSIS — Z952 Presence of prosthetic heart valve: Secondary | ICD-10-CM

## 2022-10-03 LAB — BASIC METABOLIC PANEL
Anion gap: 10 (ref 5–15)
BUN: 28 mg/dL — ABNORMAL HIGH (ref 8–23)
CO2: 25 mmol/L (ref 22–32)
Calcium: 8.9 mg/dL (ref 8.9–10.3)
Chloride: 105 mmol/L (ref 98–111)
Creatinine, Ser: 0.59 mg/dL (ref 0.44–1.00)
GFR, Estimated: >60 mL/min (ref >60)
Glucose, Bld: 175 mg/dL — ABNORMAL HIGH (ref 70–99)
Potassium: 4.2 mmol/L (ref 3.5–5.1)
Sodium: 140 mmol/L (ref 135–145)

## 2022-10-03 LAB — MAGNESIUM: Magnesium: 2 mg/dL (ref 1.7–2.4)

## 2022-10-03 LAB — GLUCOSE, CAPILLARY
Glucose-Capillary: 175 mg/dL — ABNORMAL HIGH (ref 70–99)
Glucose-Capillary: 140 mg/dL — ABNORMAL HIGH (ref 70–99)
Glucose-Capillary: 142 mg/dL — ABNORMAL HIGH (ref 70–99)
Glucose-Capillary: 134 mg/dL — ABNORMAL HIGH (ref 70–99)
Glucose-Capillary: 137 mg/dL — ABNORMAL HIGH (ref 70–99)

## 2022-10-03 LAB — PROTIME-INR
INR: 2.4 — ABNORMAL HIGH (ref 0.8–1.2)
Prothrombin Time: 26 seconds — ABNORMAL HIGH (ref 11.4–15.2)

## 2022-10-03 MED ORDER — WARFARIN SODIUM 5 MG PO TABS
5.0000 mg | ORAL_TABLET | Freq: Once | ORAL | Status: AC
  Administered 2022-10-03: 5 mg via ORAL
  Filled 2022-10-03: qty 1

## 2022-10-03 MED ORDER — METHOCARBAMOL 1000 MG/10ML IJ SOLN
500.0000 mg | Freq: Three times a day (TID) | INTRAVENOUS | Status: DC | PRN
  Administered 2022-10-05 – 2022-10-06 (×3): 500 mg via INTRAVENOUS
  Filled 2022-10-03 (×2): qty 5
  Filled 2022-10-03: qty 500

## 2022-10-03 MED ORDER — K PHOS MONO-SOD PHOS DI & MONO 155-852-130 MG PO TABS
500.0000 mg | ORAL_TABLET | Freq: Two times a day (BID) | ORAL | Status: AC
  Administered 2022-10-03 (×2): 500 mg
  Filled 2022-10-03 (×2): qty 2

## 2022-10-03 NOTE — Progress Notes (Signed)
ANTICOAGULATION CONSULT NOTE - Follow Up Consult  Pharmacy Consult for Warfarin Indication:  MVR  Allergies  Allergen Reactions   Zoloft [Sertraline] Hives, Itching and Rash    Patient Measurements: Height: '5\' 6"'$  (167.6 cm) Weight: 49.9 kg (109 lb 14.4 oz) IBW/kg (Calculated) : 59.3  Vital Signs: Temp: 99.2 F (37.3 C) (11/26 0734) Temp Source: Oral (11/26 0734) BP: 127/75 (11/26 0734) Pulse Rate: 91 (11/26 0734)  Labs: Recent Labs    10/01/22 0238 10/02/22 0708 10/03/22 0321  HGB 11.0* 11.3*  --   HCT 34.8* 35.0*  --   PLT 146* 150  --   LABPROT 38.1* 39.4* 26.0*  INR 3.9* 4.1* 2.4*  CREATININE  --  0.61 0.59    Estimated Creatinine Clearance: 47.1 mL/min (by C-G formula based on SCr of 0.59 mg/dL).   Medications:  Scheduled:    stroke: early stages of recovery book   Does not apply Once   aspirin  81 mg Per Tube Daily   atorvastatin  40 mg Per Tube Daily   famotidine  20 mg Per Tube BID   feeding supplement (PROSource TF20)  60 mL Per Tube Daily   free water  200 mL Per Tube Q8H   insulin aspart  0-15 Units Subcutaneous Q4H   metoprolol tartrate  12.5 mg Per Tube BID   PARoxetine  10 mg Per Tube q AM   phosphorus  500 mg Per Tube BID   Warfarin - Pharmacist Dosing Inpatient   Does not apply q1600   Infusions:   feeding supplement (JEVITY 1.5 CAL/FIBER) 1,000 mL (10/03/22 0727)    Assessment: [name] presenting with stroke sx, hx of MVR on warfarin PTA with last dose 11/20. INR on admission supratherapeutic at 4.3. Per medication history obtained from SNF - patient's last PTA dose of warfarin was 10 mg given on 11/20 at ~1700.   Patient received one dose of Warfarin 10 mg on 11/23. Warfarin was held on 11/24 due to supratherapeutic INR (3.9).  Today's INR is subtherapeutic at 2.4. CBC stable. No signs/symptoms of bleeding noted per RN.  PTA dosing: Alternating 10 mg with 5 mg daily  Goal of Therapy:  INR 2.5-3.5 Monitor platelets by anticoagulation  protocol: Yes   Plan:  Warfarin 5 mg PO x 1 tonight. Daily INR. Monitor CBC and for signs/symptoms of bleeding.   Leticia Clas, PharmD Candidate Clinical Pharmacy Intern

## 2022-10-03 NOTE — Progress Notes (Signed)
Daily Progress Note   Patient Name: [name]       Date: 10/03/2022 DOB: 1945/12/21  Age: 76 y.o. MRN#: [id number] Attending Physician: Gabrielle Haff, MD Primary Care Physician: Gabrielle Pao, MD Admit Date: 09/19/2022  Reason for Consultation/Follow-up: Establishing goals of care  Subjective: Chart review performed including progress notes, labs, imaging.  Patient assessed at the bedside.  She denies pain or distress.  She verbalizes " I do not want those donuts ever again."  She confirms this is what she intended to say but is unable to clarify further.  Discussed with NT.  She reports patient ate about 25% of her meals today.  Her husband [name] then returned to the bedside.  Created space and opportunity for patient family's thoughts and feelings on her current illness.  [name] shares it is still difficult for him to make out what the patient is trying to say, although her nurse and doctor have both said that she is speaking more clearly.  He has not been able to understand anything she has said so far.  Reviewed MBS results and SLP recommendations for CIR.  Provided update on recommendation for repeat echocardiogram in a few weeks.  Clarified that patient will likely need more speech therapy rather than another MBS or other imaging studies.  He shares about patient's 7 strokes and her recovery most recently (she was able to ambulate slowly with a walker again).  He understands that she still needs time for outcomes and to determine her new baseline.  Provided hard choices for loving people booklet for further review in anticipation of further decision making.  He is Patent attorney.  All questions and concerns addressed. Encouraged to call with questions and/or concerns. PMT card previously  provided.  Length of Stay: 5  Physical Exam Vitals and nursing note reviewed.  Constitutional:      General: She is awake. She is not in acute distress.    Appearance: She is ill-appearing.  Cardiovascular:     Rate and Rhythm: Normal rate.  Pulmonary:     Effort: No respiratory distress.  Skin:    General: Skin is warm and dry.  Neurological:     Mental Status: She is alert.     Motor: Weakness present.  Psychiatric:        Behavior: Behavior is  cooperative.            Vital Massey: BP 127/75 (BP Location: Right Arm)   Pulse 91   Temp 99.2 F (37.3 C) (Oral)   Resp (!) 22   Ht '5\' 6"'$  (1.676 m)   Wt 49.9 kg   SpO2 99%   BMI 17.74 kg/m  SpO2: SpO2: 99 % O2 Device: O2 Device: Room Air O2 Flow Rate:    Palliative Assessment/Data: PPS 30% with tube feeds   Palliative Care Assessment & Plan   Patient Profile: 76 y.o. female  with past medical history of  DM, multiple CVAs with hemiparesis, MVR on Coumadin, and HTN presented to ED on 09/29/2022 from [facility] with stroke-like symptoms - left sided gaze, right sided weakness. Patient was admitted on 09/26/2022 with acute CVA.    Of note, patient has had 3 hospital admissions in the last 6 months and is a 30 day readmission.  Assessment: Principal Problem:   Acute CVA (cerebrovascular accident) ([place]) Active Problems:   Diabetes mellitus without complication ([place])   H/O mitral valve replacement with mechanical valve   HTN (hypertension)   HLD (hyperlipidemia)   DNR (do not resuscitate)   Protein-calorie malnutrition, severe    Recommendations/Plan: Continue DNR Continue full scope treatment Ongoing GOC pending clinical course PMT will continue to follow and support holistically   Prognosis:  Unable to determine  Discharge Planning: To Be Determined  Care plan was discussed with primary RN, patient, patient's husband   MDM: High   [name] Palliative Medicine Team Team phone #  [phone]  Thank you for allowing the Palliative Medicine Team to assist in the care of this patient. Please utilize secure chat with additional questions, if there is no response within 30 minutes please call the above phone number.  Palliative Medicine Team providers are available by phone from 7am to 7pm daily and can be reached through the team cell phone.  Should this patient require assistance outside of these hours, please call the patient's attending physician.

## 2022-10-03 NOTE — Plan of Care (Signed)
  Problem: Education: Goal: Knowledge of disease or condition will improve Outcome: Progressing Goal: Knowledge of secondary prevention will improve (MUST DOCUMENT ALL) Outcome: Progressing Goal: Knowledge of patient specific risk factors will improve [name] N/A or DELETE if not current risk factor) Outcome: Progressing   Problem: Ischemic Stroke/TIA Tissue Perfusion: Goal: Complications of ischemic stroke/TIA will be minimized Outcome: Progressing   Problem: Coping: Goal: Will verbalize positive feelings about self Outcome: Progressing Goal: Will identify appropriate support needs Outcome: Progressing   Problem: Health Behavior/Discharge Planning: Goal: Goals will be collaboratively established with patient/family Outcome: Progressing   Problem: Self-Care: Goal: Verbalization of feelings and concerns over difficulty with self-care will improve Outcome: Progressing Goal: Ability to communicate needs accurately will improve Outcome: Progressing   Problem: Nutrition: Goal: Dietary intake will improve Outcome: Progressing   Problem: Education: Goal: Knowledge of General Education information will improve Description: Including pain rating scale, medication(s)/side effects and non-pharmacologic comfort measures Outcome: Progressing   Problem: Clinical Measurements: Goal: Ability to maintain clinical measurements within normal limits will improve Outcome: Progressing Goal: Will remain free from infection Outcome: Progressing Goal: Diagnostic test results will improve Outcome: Progressing Goal: Respiratory complications will improve Outcome: Progressing Goal: Cardiovascular complication will be avoided Outcome: Progressing   Problem: Nutrition: Goal: Adequate nutrition will be maintained Outcome: Progressing   Problem: Coping: Goal: Level of anxiety will decrease Outcome: Progressing   Problem: Elimination: Goal: Will not experience complications related to bowel  motility Outcome: Progressing Goal: Will not experience complications related to urinary retention Outcome: Progressing   Problem: Safety: Goal: Ability to remain free from injury will improve Outcome: Progressing   Problem: Skin Integrity: Goal: Risk for impaired skin integrity will decrease Outcome: Progressing   Problem: Education: Goal: Ability to describe self-care measures that may prevent or decrease complications (Diabetes Survival Skills Education) will improve Outcome: Progressing Goal: Individualized Educational Video(s) Outcome: Progressing   Problem: Coping: Goal: Ability to adjust to condition or change in health will improve Outcome: Progressing   Problem: Fluid Volume: Goal: Ability to maintain a balanced intake and output will improve Outcome: Progressing   Problem: Health Behavior/Discharge Planning: Goal: Ability to identify and utilize available resources and services will improve Outcome: Progressing Goal: Ability to manage health-related needs will improve Outcome: Progressing   Problem: Metabolic: Goal: Ability to maintain appropriate glucose levels will improve Outcome: Progressing

## 2022-10-03 NOTE — Progress Notes (Signed)
[facility] PROGRESS NOTE   [name] YQM:[id number] DOB: 1946-03-14 DOA: 10/06/2022  PCP: Haywood Pao, MD  Brief History/Interval Summary: 76 y.o. 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      Consultants: Neurology.  Palliative care  Procedures: Transthoracic echocardiogram    Subjective/Interval History: Continues to have dysarthria though speech has improved in the last 2 to 3 days.  Seems to be lying comfortably on the bed with a smile on her face.  Denies any complaints.       Assessment/Plan:  Acute stroke MRI raised concern for embolic stroke. Patient was already on warfarin.  Was not given TPA. Seen by neurology.  Patient started on aspirin. Echocardiogram shows normal systolic function.  Moderate pericardial effusion was noted. LDL is 81.  HbA1c is 6.2. Patient on statin. PT and OT following.  Skilled nursing facility is recommended for rehab. Patient seen by speech therapy.  Currently on a dysphagia 1 diet.  Also getting tube feedings. No further workup with neurology.  Continue warfarin along with aspirin.  Oropharyngeal dysphagia Speech therapy is following.  Cleared for oral intake with dysphagia 1 diet.  Nurse reported poor oral intake yesterday.  Will see how she does over the next 24 to 48 hours. Continue with NG tube feedings for now. Patient and family not sure about PEG tube if needed.  Moderate pericardial effusion Noted on echocardiogram.  No tamponade physiology.  Previous echocardiogram from 2021 showed small pericardial effusion.  TSH noted to be 1.32.  Free T41.15. Patient followed by Dr. Acie Fredrickson with cardiology. Depending on how patient does over the next 1 to 2 weeks it may be reasonable for her to follow-up with her cardiologist for repeat echocardiogram in a few weeks time.  Essential hypertension Allowing  permissive hypertension.  Amlodipine on hold.  Nonsustained ventricular tachycardia Likely triggered by hypokalemia.  EF is noted to be normal on echocardiogram.  Potassium was corrected.  Magnesium also corrected.  Started on low-dose beta-blocker.  Okay to come off of telemetry.    Hypokalemia/hypophosphatemia Potassium is normal today.  Will give phosphorus supplements.  Hyperlipidemia Continue statin  History of mitral valve replacement On warfarin prior to admission.  Pharmacy is managing.  Goal INR is between 2.5-3.5.    Normocytic anemia/mild thrombocytopenia No evidence of overt bleeding.  Continue to monitor periodically.  Diabetes mellitus type 2 HbA1c 6.2.  Started on sliding scale coverage as she is now on tube feedings.    Goals of care Palliative care is following.  Patient is DNR.  Hopefully her dysphagia will improve and we may not need artificial nutrition for too long.  DVT Prophylaxis: On warfarin Code Status: DNR Family Communication: Husband being updated daily. Disposition Plan: Skilled nursing facility recommended by PT.  Status is: Inpatient Remains inpatient appropriate because: Acute stroke      Medications: Scheduled:   stroke: early stages of recovery book   Does not apply Once   aspirin  81 mg Per Tube Daily   atorvastatin  40 mg Per Tube Daily   famotidine  20 mg Per Tube BID   feeding supplement (PROSource TF20)  60 mL Per Tube Daily   free water  200 mL Per Tube Q8H   insulin aspart  0-15 Units Subcutaneous Q4H   metoprolol tartrate  12.5 mg Per Tube BID   PARoxetine  10 mg Per Tube q  AM   phosphorus  500 mg Per Tube BID   warfarin  5 mg Oral ONCE-1600   Warfarin - Pharmacist Dosing Inpatient   Does not apply q1600   Continuous:  feeding supplement (JEVITY 1.5 CAL/FIBER) 1,000 mL (10/03/22 0727)   [id number] **OR** acetaminophen (TYLENOL) oral liquid 160 mg/5 mL **OR** acetaminophen, bisacodyl,  methocarbamol  Antibiotics: Anti-infectives (From admission, onward)    None       Objective:  Vital Signs  Vitals:   10/02/22 1951 10/03/22 0000 10/03/22 0438 10/03/22 0734  BP: 118/80 117/80  127/75  Pulse: 98 79  91  Resp: 16 17  (!) 22  Temp: 99.3 F (37.4 C) 99.9 F (37.7 C)  99.2 F (37.3 C)  TempSrc: Oral Oral  Oral  SpO2: 97% 97%  99%  Weight:   49.9 kg   Height:        Intake/Output Summary (Last 24 hours) at 10/03/2022 1015 Last data filed at 10/03/2022 0936 Gross per 24 hour  Intake 0.5 ml  Output 450 ml  Net -449.5 ml    Filed Weights   10/01/22 0500 10/02/22 0427 10/03/22 0438  Weight: 50.2 kg 50.3 kg 49.9 kg    General appearance: Awake alert.  In no distress NG feeding tube is noted Resp: Clear to auscultation bilaterally.  Normal effort Cardio: S1-S2 is normal regular.  No S3-S4.  No rubs murmurs or bruit GI: Abdomen is soft.  Nontender nondistended.  Bowel sounds are present normal.  No masses organomegaly Extremities: No edema.  Full range of motion of lower extremities. Neurologic: Remains dysarthric.  Very limited mobility of her extremities.   Lab Results:  Data Reviewed: I have personally reviewed following labs and reports of the imaging studies  CBC: Recent Labs  Lab 09/17/2022 0926 10/03/2022 0931 09/30/22 0222 10/01/22 0238 10/02/22 0708  WBC 5.4  --  6.1 4.9 6.1  NEUTROABS 3.9  --   --   --   --   HGB 11.6* 12.9 11.4* 11.0* 11.3*  HCT 35.6* 38.0 34.7* 34.8* 35.0*  MCV 83.0  --  83.4 85.1 83.7  PLT 172  --  161 146* 150     Basic Metabolic Panel: Recent Labs  Lab 10/04/2022 0926 09/10/2022 0931 09/30/22 0222 10/01/22 0238 10/01/22 1747 10/02/22 0708 10/02/22 1736 10/03/22 0321  NA 137 139 141  --   --  138  --  140  K 3.8 4.1 3.5  --   --  3.3*  --  4.2  CL 101 102 107  --   --  107  --  105  CO2 27  --  25  --   --  23  --  25  GLUCOSE 146* 157* 97  --   --  169*  --  175*  BUN 21 25* 20  --   --  26*  --  28*   CREATININE 0.79 0.70 0.69  --   --  0.61  --  0.59  CALCIUM 9.0  --  9.3  --   --  9.0  --  8.9  MG  --   --   --  1.7 1.7 1.9 2.2 2.0  PHOS  --   --   --  3.3 2.6 2.2* 1.8*  --      GFR: Estimated Creatinine Clearance: 47.1 mL/min (by C-G formula based on SCr of 0.59 mg/dL).  Liver Function Tests: Recent Labs  Lab 09/25/2022 0926 09/30/22 0222 10/02/22 2426  AST 16 16 14*  ALT '12 13 10  '$ ALKPHOS 78 79 72  BILITOT 0.2* 0.9 0.4  PROT 5.7* 5.8* 5.6*  ALBUMIN 3.1* 3.3* 3.1*      Coagulation Profile: Recent Labs  Lab 09/29/22 0324 09/30/22 0222 10/01/22 0238 10/02/22 0708 10/03/22 0321  INR 3.2* 3.2* 3.9* 4.1* 2.4*     CBG: Recent Labs  Lab 10/02/22 1604 10/02/22 1952 10/02/22 2358 10/03/22 0412 10/03/22 0736  GLUCAP 163* 142* 126* 175* 140*      Radiology Studies: DG Swallowing Func-Speech Pathology  Result Date: 10/01/2022 Table formatting from the original result was not included. Images from the original result were not included. Objective Swallowing Evaluation: Type of Study: MBS-Modified Barium Swallow Study  Patient Details Name: [name] MRN: [id number] Date of Birth: 1946/01/12 Today's Date: 10/01/2022 Time: SLP Start Time (ACUTE ONLY): 1010 -SLP Stop Time (ACUTE ONLY): 1022 SLP Time Calculation (min) (ACUTE ONLY): 12 min Past Medical History: Past Medical History: Diagnosis Date  Abnormality of gait 09/07/2016  Allergy   Diabetes mellitus without complication ([place])   Patient denies this - notes history of glucose intolerance  GERD (gastroesophageal reflux disease)   Hemiparesis and alteration of sensations as late effects of stroke ([place]) 09/07/2016  History of pneumonia 1997  Hypertension   S/P MVR (mitral valve replacement)   Mechanical mitral valve replacement at age 63 (done in [place])  // echo 7/17: EF 55-60, normal wall motion, bileaflet mechanical mitral valve prosthesis functioning normally, mild LAE, mildly reduced RVSF, small pericardial  effusion  Stroke ([place]) 1997, 2013, 2015 Past Surgical History: Past Surgical History: Procedure Laterality Date  ABDOMINAL HYSTERECTOMY    BRAIN SURGERY    BUNIONECTOMY  1993  CARDIAC VALVE REPLACEMENT  1997  TOE SURGERY  2116 HPI: 76 year old female with acute onset right hemiplegia with left gaze and relatively preserved language.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 Multiple strokes (1997, 2013, 2015, 2017). Has been in decline, per husband since 06/2022 and presently resides in SNF.  Subjective: alert, upright in chair with spouse present  Recommendations for follow up therapy are one component of a multi-disciplinary discharge planning process, led by the attending physician.  Recommendations may be updated based on patient status, additional functional criteria and insurance authorization. Assessment / Plan / Recommendation   10/01/2022  10:00 AM Clinical Impressions Clinical Impression Pt demonstrates mild to moderate oral dysphagia related to right lingual and labial impairment. There is slow oral manipulation and mild right sided residue (worsened if tilted right). Pt is able to masticate a solid during assessment, but would need some practice to advance to textured solids (clearing residue, following solids with liquids, positioning upright). Pt has delayed swallow initiation with liquids, aspirating nectar thick liquids before the swallow regardless of bolus size. Pts sensation is variable; she coughs with the first episode, but cough is weak. Further episodes of aspiration  are silent. Recommend initiating puree and honey thick liquids with potential to uprgrade and awareness and sensation improve. Recommend f/u with AIR. SLP Visit Diagnosis Dysphagia, oropharyngeal phase (R13.12) Impact on safety and function Moderate aspiration risk;Risk for inadequate nutrition/hydration     10/01/2022  10:00 AM Treatment Recommendations  Treatment Recommendations Therapy as outlined in treatment plan below     10/01/2022  10:00 AM Prognosis Prognosis for Safe Diet Advancement Good   10/01/2022  10:00 AM Diet Recommendations SLP Diet Recommendations Dysphagia 1 (Puree) solids;Honey thick liquids  Liquid Administration via Cup;Spoon Medication Administration Crushed with puree Compensations Slow rate;Small sips/bites;Follow solids with liquid;Lingual sweep for clearance of pocketing Postural Changes Remain semi-upright after after feeds/meals (Comment)     10/01/2022  10:00 AM Other Recommendations Follow Up Recommendations Acute inpatient rehab (3hours/day) Functional Status Assessment Patient has had a recent decline in their functional status and demonstrates the ability to make significant improvements in function in a reasonable and predictable amount of time.   10/01/2022  10:00 AM Frequency and Duration  Speech Therapy Frequency (ACUTE ONLY) min 2x/week Treatment Duration 2 weeks     10/01/2022  10:00 AM Oral Phase Oral Phase Impaired Oral - Honey Teaspoon Weak lingual manipulation;Delayed oral transit;Decreased bolus cohesion;Right pocketing in lateral sulci;Lingual/palatal residue Oral - Nectar Teaspoon Weak lingual manipulation;Delayed oral transit;Decreased bolus cohesion;Right pocketing in lateral sulci;Lingual/palatal residue Oral - Nectar Cup Weak lingual manipulation;Delayed oral transit;Decreased bolus cohesion;Right pocketing in lateral sulci;Lingual/palatal residue Oral - Nectar Straw Weak lingual manipulation;Delayed oral transit;Decreased bolus cohesion;Right pocketing in lateral sulci;Lingual/palatal residue Oral - Puree Weak lingual manipulation;Delayed oral transit;Decreased bolus cohesion;Right pocketing in lateral sulci;Lingual/palatal residue Oral - Mech Soft Weak lingual manipulation;Delayed oral transit;Decreased bolus cohesion;Right pocketing in lateral sulci;Lingual/palatal residue;Impaired mastication Oral - Pill Weak  lingual manipulation;Delayed oral transit;Decreased bolus cohesion;Right pocketing in lateral sulci;Lingual/palatal residue    10/01/2022  10:00 AM Pharyngeal Phase Pharyngeal Phase Impaired Pharyngeal- Honey Teaspoon Delayed swallow initiation-vallecula Pharyngeal- Honey Cup Delayed swallow initiation-vallecula Pharyngeal- Nectar Teaspoon Delayed swallow initiation-pyriform sinuses;Penetration/Aspiration before swallow Pharyngeal Material enters airway, CONTACTS cords and then ejected out Pharyngeal- Nectar Cup Delayed swallow initiation-pyriform sinuses;Penetration/Aspiration before swallow;Trace aspiration Pharyngeal Material enters airway, passes BELOW cords without attempt by patient to eject out (silent aspiration) Pharyngeal- Nectar Straw Penetration/Aspiration before swallow;Delayed swallow initiation-pyriform sinuses Pharyngeal Material enters airway, passes BELOW cords without attempt by patient to eject out (silent aspiration) Pharyngeal- Puree Delayed swallow initiation-vallecula Pharyngeal- Mechanical Soft Delayed swallow initiation-vallecula Pharyngeal- Pill Delayed swallow initiation-vallecula;Pharyngeal residue - valleculae     No data to display    DeBlois, Katherene Ponto 10/01/2022, 11:15 AM                     DG Abd 1 View  Result Date: 10/01/2022 CLINICAL DATA:  Encounter for feeding tube placement EXAM: ABDOMEN - 1 VIEW COMPARISON:  CT 08/27/2022 FINDINGS: Feeding tube tip overlies the stomach. No evidence of bowel obstruction. Moderate stool burden. Contrast material is in the stomach and small bowel. IMPRESSION: Feeding tube tip overlies the stomach. Electronically Signed   By: Maurine Simmering M.D.   On: 10/01/2022 10:44       LOS: 5 days   [facility] Pager on [email or web address]  10/03/2022, 10:15 AM

## 2022-10-04 DIAGNOSIS — Z7901 Long term (current) use of anticoagulants: Secondary | ICD-10-CM | POA: Diagnosis not present

## 2022-10-04 DIAGNOSIS — R1312 Dysphagia, oropharyngeal phase: Secondary | ICD-10-CM | POA: Diagnosis not present

## 2022-10-04 DIAGNOSIS — I639 Cerebral infarction, unspecified: Secondary | ICD-10-CM | POA: Diagnosis not present

## 2022-10-04 DIAGNOSIS — Z952 Presence of prosthetic heart valve: Secondary | ICD-10-CM | POA: Diagnosis not present

## 2022-10-04 DIAGNOSIS — Z66 Do not resuscitate: Secondary | ICD-10-CM | POA: Diagnosis not present

## 2022-10-04 DIAGNOSIS — Z7189 Other specified counseling: Secondary | ICD-10-CM | POA: Diagnosis not present

## 2022-10-04 DIAGNOSIS — I10 Essential (primary) hypertension: Secondary | ICD-10-CM | POA: Diagnosis not present

## 2022-10-04 LAB — CBC
HCT: 37.5 % (ref 36.0–46.0)
Hemoglobin: 12.4 g/dL (ref 12.0–15.0)
MCH: 27.3 pg (ref 26.0–34.0)
MCHC: 33.1 g/dL (ref 30.0–36.0)
MCV: 82.6 fL (ref 80.0–100.0)
Platelets: 157 10*3/uL (ref 150–400)
RBC: 4.54 MIL/uL (ref 3.87–5.11)
RDW: 15.4 % (ref 11.5–15.5)
WBC: 8 10*3/uL (ref 4.0–10.5)
nRBC: 0 % (ref 0.0–0.2)

## 2022-10-04 LAB — GLUCOSE, CAPILLARY
Glucose-Capillary: 153 mg/dL — ABNORMAL HIGH (ref 70–99)
Glucose-Capillary: 170 mg/dL — ABNORMAL HIGH (ref 70–99)
Glucose-Capillary: 178 mg/dL — ABNORMAL HIGH (ref 70–99)
Glucose-Capillary: 180 mg/dL — ABNORMAL HIGH (ref 70–99)
Glucose-Capillary: 189 mg/dL — ABNORMAL HIGH (ref 70–99)
Glucose-Capillary: 191 mg/dL — ABNORMAL HIGH (ref 70–99)

## 2022-10-04 LAB — PROTIME-INR
INR: 1.5 — ABNORMAL HIGH (ref 0.8–1.2)
Prothrombin Time: 18.1 seconds — ABNORMAL HIGH (ref 11.4–15.2)

## 2022-10-04 LAB — HEPARIN LEVEL (UNFRACTIONATED): Heparin Unfractionated: 0.1 IU/mL — ABNORMAL LOW (ref 0.30–0.70)

## 2022-10-04 MED ORDER — HEPARIN (PORCINE) 25000 UT/250ML-% IV SOLN
950.0000 [IU]/h | INTRAVENOUS | Status: AC
  Administered 2022-10-04: 650 [IU]/h via INTRAVENOUS
  Administered 2022-10-05 – 2022-10-06 (×2): 950 [IU]/h via INTRAVENOUS
  Filled 2022-10-04 (×3): qty 250

## 2022-10-04 MED ORDER — LACTATED RINGERS IV SOLN: INTRAVENOUS | Status: AC

## 2022-10-04 MED ORDER — LACTATED RINGERS IV BOLUS
1000.0000 mL | Freq: Once | INTRAVENOUS | Status: AC
  Administered 2022-10-04: 1000 mL via INTRAVENOUS

## 2022-10-04 MED ORDER — WARFARIN SODIUM 7.5 MG PO TABS
7.5000 mg | ORAL_TABLET | Freq: Once | ORAL | Status: AC
  Administered 2022-10-04: 7.5 mg via ORAL
  Filled 2022-10-04: qty 1

## 2022-10-04 NOTE — Progress Notes (Signed)
[facility] PROGRESS NOTE   [name] [id number]:[id number] DOB: 08-24-1946 DOA: 09/10/2022  PCP: Haywood Pao, MD  Brief History/Interval Summary: 76 y.o. 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      Consultants: Neurology.  Palliative care  Procedures: Transthoracic echocardiogram    Subjective/Interval History: Patient noted to be more lethargic this morning compared to yesterday.  Not very communicative this morning.        Assessment/Plan:  Acute stroke MRI raised concern for embolic stroke. Patient was already on warfarin.  Was not given TPA. Seen by neurology.  Patient started on aspirin. Echocardiogram shows normal systolic function.  Moderate pericardial effusion was noted. LDL is 81.  HbA1c is 6.2. Patient on statin. PT and OT following.  Skilled nursing facility is recommended for rehab. Patient seen by speech therapy.  Currently on a dysphagia 1 diet.  Also getting tube feedings. No further workup with neurology.  Continue warfarin along with aspirin.  Oropharyngeal dysphagia Speech therapy is following.  Cleared for oral intake with dysphagia 1 diet.   Has not had very good oral intake over the last 2 days.  Continue tube feedings for now.   Patient and family not sure about PEG tube if needed.  Palliative care continues to follow.  Moderate pericardial effusion Noted on echocardiogram.  No tamponade physiology.  Previous echocardiogram from 2021 showed small pericardial effusion.  TSH noted to be 1.32.  Free T41.15. Patient followed by Dr. Acie Fredrickson with cardiology. Depending on how patient does over the next 1 to 2 weeks it may be reasonable for her to follow-up with her cardiologist for repeat echocardiogram in a few weeks time.  Essential hypertension Initially allowed permissive hypertension.  Blood pressure is now reasonably  well-controlled.  Amlodipine is on hold.  Patient was started on beta-blocker due to need for NSVT as discussed below.  Noted to be tachycardic this morning.  Will do an EKG.    Nonsustained ventricular tachycardia Likely triggered by hypokalemia.  EF is noted to be normal on echocardiogram.  Potassium was corrected.  Magnesium also corrected.  Started on low-dose beta-blocker.  She has since come off of telemetry.  Hypokalemia/hypophosphatemia Supplemented.  Recheck labs tomorrow.  Hyperlipidemia Continue statin  History of mitral valve replacement On warfarin prior to admission.  Pharmacy is managing.  Goal INR is between 2.5-3.5.  INR noted to be subtherapeutic today.  We will start heparin infusion while we work on getting her INR up to therapeutic range.  Normocytic anemia/mild thrombocytopenia No evidence of overt bleeding.  Continue to monitor periodically.  Diabetes mellitus type 2 HbA1c 6.2.  Started on sliding scale coverage as she is now on tube feedings.    Goals of care Palliative care is following.  Patient is DNR.  DVT Prophylaxis: On warfarin Code Status: DNR Family Communication: Husband being updated daily. Disposition Plan: Skilled nursing facility recommended by PT.  Status is: Inpatient Remains inpatient appropriate because: Acute stroke      Medications: Scheduled:   stroke: early stages of recovery book   Does not apply Once   aspirin  81 mg Per Tube Daily   atorvastatin  40 mg Per Tube Daily   famotidine  20 mg Per Tube BID   feeding supplement (PROSource TF20)  60 mL Per Tube Daily   free water  200 mL Per Tube Q8H   insulin aspart  0-15 Units Subcutaneous Q4H   metoprolol tartrate  12.5 mg Per Tube BID   PARoxetine  10 mg Per Tube q AM   warfarin  7.5 mg Oral ONCE-1600   Warfarin - Pharmacist Dosing Inpatient   Does not apply q1600   Continuous:  feeding supplement (JEVITY 1.5 CAL/FIBER) 1,000 mL (10/04/22 0848)   heparin     methocarbamol  (ROBAXIN) IV     [id number] **OR** acetaminophen (TYLENOL) oral liquid 160 mg/5 mL **OR** acetaminophen, bisacodyl, methocarbamol (ROBAXIN) IV  Antibiotics: Anti-infectives (From admission, onward)    None       Objective:  Vital Signs  Vitals:   10/04/22 0413 10/04/22 0437 10/04/22 0734 10/04/22 0739  BP: 125/80  (!) 128/91   Pulse: 93  (!) 128 (!) 104  Resp: 18  17   Temp: (!) 97.5 F (36.4 C)  98.4 F (36.9 C)   TempSrc:   Oral   SpO2: 100%  96%   Weight:  50.2 kg    Height:        Intake/Output Summary (Last 24 hours) at 10/04/2022 1024 Last data filed at 10/04/2022 0242 Gross per 24 hour  Intake --  Output 500 ml  Net -500 ml    Filed Weights   10/02/22 0427 10/03/22 0438 10/04/22 0437  Weight: 50.3 kg 49.9 kg 50.2 kg    General appearance: Seems to be more lethargic today compared to yesterday. Resp: Clear to auscultation bilaterally.  Normal effort Cardio: S1-S2 is normal regular.  No S3-S4.  No rubs murmurs or bruit GI: Abdomen is soft.  Nontender nondistended.  Bowel sounds are present normal.  No masses organomegaly    Lab Results:  Data Reviewed: I have personally reviewed following labs and reports of the imaging studies  CBC: Recent Labs  Lab 09/22/2022 0926 10/02/2022 0931 09/30/22 0222 10/01/22 0238 10/02/22 0708 10/04/22 0348  WBC 5.4  --  6.1 4.9 6.1 8.0  NEUTROABS 3.9  --   --   --   --   --   HGB 11.6* 12.9 11.4* 11.0* 11.3* 12.4  HCT 35.6* 38.0 34.7* 34.8* 35.0* 37.5  MCV 83.0  --  83.4 85.1 83.7 82.6  PLT 172  --  161 146* 150 157     Basic Metabolic Panel: Recent Labs  Lab 09/22/2022 0926 09/13/2022 0931 09/30/22 0222 10/01/22 0238 10/01/22 1747 10/02/22 0708 10/02/22 1736 10/03/22 0321  NA 137 139 141  --   --  138  --  140  K 3.8 4.1 3.5  --   --  3.3*  --  4.2  CL 101 102 107  --   --  107  --  105  CO2 27  --  25  --   --  23  --  25  GLUCOSE 146* 157* 97  --   --  169*  --  175*  BUN 21 25* 20  --   --   26*  --  28*  CREATININE 0.79 0.70 0.69  --   --  0.61  --  0.59  CALCIUM 9.0  --  9.3  --   --  9.0  --  8.9  MG  --   --   --  1.7 1.7 1.9 2.2 2.0  PHOS  --   --   --  3.3 2.6 2.2* 1.8*  --      GFR: Estimated Creatinine Clearance: 47.4 mL/min (by C-G formula based on SCr of 0.59 mg/dL).  Liver Function Tests: Recent Labs  Lab 09/23/2022 0926 09/30/22 0222 10/02/22 0708  AST 16 16 14*  ALT '12 13 10  '$ ALKPHOS 78 79 72  BILITOT 0.2* 0.9 0.4  PROT 5.7* 5.8* 5.6*  ALBUMIN 3.1* 3.3* 3.1*      Coagulation Profile: Recent Labs  Lab 09/30/22 0222 10/01/22 0238 10/02/22 0708 10/03/22 0321 10/04/22 0348  INR 3.2* 3.9* 4.1* 2.4* 1.5*     CBG: Recent Labs  Lab 10/03/22 1543 10/03/22 1926 10/04/22 0007 10/04/22 0414 10/04/22 0811  GLUCAP 134* 137* 180* 170* 189*      Radiology Studies: No results found.     LOS: 6 days   Deaglan Lile [employer] on [email or web address]  10/04/2022, 10:24 AM

## 2022-10-04 NOTE — TOC Progression Note (Signed)
Transition of Care [facility]) - Progression Note    Patient Details  Name: [name] MRN: [id number] Date of Birth: 07/03/1946  Transition of Care [facility]) CM/SW Contact  Jinger Neighbors, [place] Phone Number: 10/04/2022, 11:12 AM  Clinical Narrative:    11/27- received a returned call from [place] at [facility] stating they are holding patient's bed.         Expected Discharge Plan and Services                                                 Social Determinants of Health (SDOH) Interventions    Readmission Risk Interventions     No data to display

## 2022-10-04 NOTE — Care Management Important Message (Signed)
Important Message  Patient Details  Name: [name] MRN: [id number] Date of Birth: Mar 26, 1946   Medicare Important Message Given:  Yes     Orbie Pyo 10/04/2022, 3:21 PM

## 2022-10-04 NOTE — Progress Notes (Signed)
[facility] for Heparin and Warfarin Indication:  MVR admitted for new CVA  Allergies  Allergen Reactions   Zoloft [Sertraline] Hives, Itching and Rash    Patient Measurements: Height: '5\' 6"'$  (167.6 cm) Weight: 50.2 kg (110 lb 9.6 oz) IBW/kg (Calculated) : 59.3  Heparin Dosing Weight: 50 kg  Vital Signs: Temp: 98.3 F (36.8 C) (11/27 1635) Temp Source: Oral (11/27 1635) BP: 94/69 (11/27 1900) Pulse Rate: 104 (11/27 1900)  Labs: Recent Labs    10/02/22 0708 10/03/22 0321 10/04/22 0348 10/04/22 1838  HGB 11.3*  --  12.4  --   HCT 35.0*  --  37.5  --   PLT 150  --  157  --   LABPROT 39.4* 26.0* 18.1*  --   INR 4.1* 2.4* 1.5*  --   HEPARINUNFRC  --   --   --  0.10*  CREATININE 0.61 0.59  --   --      Estimated Creatinine Clearance: 47.4 mL/min (by C-G formula based on SCr of 0.59 mg/dL).   Assessment: 76 YO female with medical history significant for multiple strokes, recent SAH, and hx MVR on warfarin PTA who presented with R sided weakness found to have a new stroke. INR on admission supratherapeutic at 4.3 with last dose of warfarin '10mg'$  PO administered on 11/20 at ~1700. Pharmacy consulted to resume warfarin, now adding heparin infusion until INR therapeutic.   INR now down 2.4 > 1.5. Received x1 warfarin '5mg'$  PO yesterday but warfarin held previous day due to elevated INR. Per previous Rph, continues to have tea-colored urine but no frank bleeding per nursing.   Heparin came back subtherapeutic at 0.1. We will increase the rate and check level in AM.   Goal of Therapy:  INR 2.5-3.5 Heparin level 0.3-0.5 units/ml Monitor platelets by anticoagulation protocol: Yes   Plan:  Increase heparin infusion to 750 units/hr, no bolus due to stroke Check heparin level in AM then daily Check INR daily while on warfarin Continue to monitor H&H and platelets    Onnie Boer, PharmD, BCIDP, AAHIVP, CPP Infectious Disease  Pharmacist 10/04/2022 7:40 PM

## 2022-10-04 NOTE — Progress Notes (Signed)
Daily Progress Note   Patient Name: [name]       Date: 10/04/2022 DOB: May 20, 1946  Age: 76 y.o. MRN#: [id number] Attending Physician: Bonnielee Haff, MD Primary Care Physician: Haywood Pao, MD Admit Date: 10/04/2022  Reason for Consultation/Follow-up: Establishing goals of care  Subjective: Chart review performed including progress notes, labs, imaging.  Patient assessed at the bedside. She is more lethargic today, easily aroused. Her husband is present at the bedside.   Created space and opportunity for patient family's thoughts and feelings on her current illness. He shares that [name] has been sleeping the entire morning since he arrived around 9 AM.  He wonders if this is due to her staying up late last night and then waking up early for nursing care and speech therapy, as she used to do this at [facility] while talking to her roommate late at night.  He worries that she did not make any indication she even knew who he was when he arrived today.    I attempted to speak with the patient, exploring whether she was tired due to being up late or if she is currently suffering in any way.  She very feebly shakes her head "no" she is not.  She falls asleep quickly and needs to be aroused again several times.  Provided patient with reassurance that it is okay to be tired and explained our efforts to support her husband and allowing more time to see whether she will improve.  She does verbalize "okay" before going back to sleep.  [name] wonders what will happen if she does not get any better.  Counseled on the option of hospice care in different settings and reviewed hospice philosophy, explaining that patient would be supported with a focus on comfort, peace and dignity as the natural disease  process is allowed to continue without intervening with life-prolonging interventions such as medications, diagnostics, artificial nutrition and hydration.  Counseled on the importance of prioritizing what [name]'s wishes would be if she was able tell us her thoughts on her care preferences and if this is going to be her new normal.  He states they have had conversations about this in the past and he has a pretty good idea that she would prefer to let nature take its course, however he would prefer if she would  be able to participate in this decision-making process.  We discussed plans to involve her more in the conversation in the coming days if she is able.  All questions and concerns addressed. Encouraged to call with questions and/or concerns. PMT card previously provided.  Length of Stay: 6  Physical Exam Vitals and nursing note reviewed.  Constitutional:      General: She is awake. She is not in acute distress.    Appearance: She is ill-appearing.  Cardiovascular:     Rate and Rhythm: Normal rate.  Pulmonary:     Effort: No respiratory distress.  Skin:    General: Skin is warm and dry.  Neurological:     Mental Status: She is lethargic.     Motor: Weakness present.  Psychiatric:        Speech: Speech is delayed.            Vital Signs: BP 105/68 (BP Location: Left Arm)   Pulse 90   Temp 98.5 F (36.9 C) (Oral)   Resp 19   Ht '5\' 6"'$  (1.676 m)   Wt 50.2 kg   SpO2 96%   BMI 17.85 kg/m  SpO2: SpO2: 96 % O2 Device: O2 Device: Room Air O2 Flow Rate:    Palliative Assessment/Data: PPS 30% with tube feeds   Palliative Care Assessment & Plan   Patient Profile: 76 y.o. female  with past medical history of  DM, multiple CVAs with hemiparesis, MVR on Coumadin, and HTN presented to ED on 09/08/2022 from [facility] with stroke-like symptoms - left sided gaze, right sided weakness. Patient was admitted on 09/12/2022 with acute CVA.    Of note, patient has had 3 hospital admissions in  the last 6 months and is a 30 day readmission.  Assessment: Principal Problem:   Acute CVA (cerebrovascular accident) ([place]) Active Problems:   Diabetes mellitus without complication ([place])   H/O mitral valve replacement with mechanical valve   HTN (hypertension)   HLD (hyperlipidemia)   DNR (do not resuscitate)   Protein-calorie malnutrition, severe    Recommendations/Plan: Continue DNR Continue full scope treatment for now, patient's husband would likely transition to comfort care and hospice at SNF if she does not improve Ongoing GOC pending clinical course, patient's husband is hopeful that she will be able to participate and unfortunately she is too lethargic today PMT will continue to follow and support holistically   Prognosis:  Unable to determine  Discharge Planning: To Be Determined  Care plan was discussed with patient, patient's husband, Dr. Maryland Pink   MDM: High   Dorthy Cooler, [facility] Palliative Medicine Team Team phone # [phone]  Thank you for allowing the Palliative Medicine Team to assist in the care of this patient. Please utilize secure chat with additional questions, if there is no response within 30 minutes please call the above phone number.  Palliative Medicine Team providers are available by phone from 7am to 7pm daily and can be reached through the team cell phone.  Should this patient require assistance outside of these hours, please call the patient's attending physician.

## 2022-10-04 NOTE — Progress Notes (Signed)
Speech Language Pathology Treatment: Dysphagia  Patient Details Name: [name] MRN: [id number] DOB: [birth year] Today's Date: 10/04/2022 Time: 2536-6440 SLP Time Calculation (min) (ACUTE ONLY): 22 min  Assessment / Plan / Recommendation Clinical Impression  Pt seen for f/u dysphagia tx with intake of current diet of honey-thickened liquids/puree consistencies with delayed swallow initiation, oral holding, prolonged oral manipulation/propulsion and anterior loss/pooling of saliva during intake with delayed cough and multiple swallows observed during trial.  Pt's head positioning impacted intake as she tilted her head forward and to left during trial; repositioning assisted with head being maintained in neutral position and mod verbal/tactile cues provided by SLP.  Pt with decreased breath support with intelligibility impacted secondary to mod dysarthric speech during word-phrase completion with repetition required.  Simple responses to personal questions accurate, but delayed processing observed.  Swallow function mirrored pt presentation c/b slow, delayed onset of swallow and required min-mod cueing during trial. Oral suctioning completed post intake d/t pooling of saliva.  Oral care completed prior to intake with min lingual residue removed.  Continue current diet of D1/honey-thickened liquids and ST will continue to f/u in acute setting for dysphagia/dysarthria and ongoing cog/ling assessment.     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 Multiple strokes (1997, 2013, 2015, 2017). Has been in decline, per husband since 06/2022 and presently resides in SNF [facility]).  MBS completed on 10/01/22 with recommendation for D1/Honey-thickened liquids.  ST f/u for diet tolerance/advancement?      SLP Plan  Continue  with current plan of care      Recommendations for follow up therapy are one component of a multi-disciplinary discharge planning process, led by the attending physician.  Recommendations may be updated based on patient status, additional functional criteria and insurance authorization.    Recommendations  Diet recommendations: Dysphagia 1 (puree);Honey-thick liquid Liquids provided via: Teaspoon Medication Administration: Via alternative means Supervision: Full supervision/cueing for compensatory strategies;Trained caregiver to feed patient Compensations: Slow rate;Small sips/bites;Monitor for anterior loss Postural Changes and/or Swallow Maneuvers: Seated upright 90 degrees                General recommendations: Other(comment) (TBD) Oral Care Recommendations: Oral care BID;Staff/trained caregiver to provide oral care Follow Up Recommendations: Acute inpatient rehab (3hours/day) Assistance recommended at discharge: Frequent or constant Supervision/Assistance SLP Visit Diagnosis: Dysphagia, oropharyngeal phase (R13.12) Plan: Continue with current plan of care           [name], M.S., CCC-SLP  10/04/2022, 11:08 AM

## 2022-10-04 NOTE — Progress Notes (Signed)
[facility] for Heparin and Warfarin Indication:  MVR admitted for new CVA  Allergies  Allergen Reactions   Zoloft [Sertraline] Hives, Itching and Rash    Patient Measurements: Height: '5\' 6"'$  (167.6 cm) Weight: 50.2 kg (110 lb 9.6 oz) IBW/kg (Calculated) : 59.3  Heparin Dosing Weight: 50 kg  Vital Signs: Temp: 98.4 F (36.9 C) (11/27 0734) Temp Source: Oral (11/27 0734) BP: 128/91 (11/27 0734) Pulse Rate: 104 (11/27 0739)  Labs: Recent Labs    10/02/22 0708 10/03/22 0321 10/04/22 0348  HGB 11.3*  --  12.4  HCT 35.0*  --  37.5  PLT 150  --  157  LABPROT 39.4* 26.0* 18.1*  INR 4.1* 2.4* 1.5*  CREATININE 0.61 0.59  --     Estimated Creatinine Clearance: 47.4 mL/min (by C-G formula based on SCr of 0.59 mg/dL).   Assessment: 76 YO female with medical history significant for multiple strokes, recent SAH, and hx MVR on warfarin PTA who presented with R sided weakness found to have a new stroke. INR on admission supratherapeutic at 4.3 with last dose of warfarin '10mg'$  PO administered on 11/20 at ~1700. Pharmacy consulted to resume warfarin, now adding heparin infusion until INR therapeutic.   INR now down 2.4 > 1.5. Received x1 warfarin '5mg'$  PO yesterday but warfarin held previous day due to elevated INR. Per previous Rph, continues to have tea-colored urine but no frank bleeding per nursing.   Goal of Therapy:  INR 2.5-3.5 Heparin level 0.3-0.5 units/ml Monitor platelets by anticoagulation protocol: Yes   Plan:  Start heparin infusion at 650 units/hr, no bolus due to stroke Give warfarin 7.5 mg PO x1 dose today Check heparin level in 8 hours and daily while on heparin Check INR daily while on warfarin Continue to monitor H&H and platelets    Thank you for allowing pharmacy to be a part of this patient's care.  Ardyth Harps, PharmD Clinical Pharmacist

## 2022-10-04 NOTE — Progress Notes (Signed)
Physical Therapy Treatment Patient Details Name: [name] MRN: [id number] DOB: 1946/02/21 Today's Date: 10/04/2022   History of Present Illness 76 yo female with onset of stroke-like symptoms was admitted on 11/21, now found to have many small scattered acute or early subacute infarcts throughout the infratentorial and supratentorial brain, probably embolic. PMHx:  encephalomalacia, subarachnoid hemorrhage overlying the left greater than right parietaoccipital lobes, post mechanical mitral valve replacement, hemiparesis, HTN, HLD, DM, PNA    PT Comments    Pt continues to be limited by lethargy however making incremental progress towards acute goals this session. Pt continues to require total assist to come to sitting EOB and total assist to maintain balance. Extended time spent progressing pt posture as pt with tendency toward kyphotic posture with chin at chest. Despite extended stretch pt unable to maintain head in neutral without total assist. Pt able to hold head in neutral in gravity reduced position with HOB elevated to 45 degrees. Pt continues to benefit from skilled PT services to progress toward functional mobility goals.    Recommendations for follow up therapy are one component of a multi-disciplinary discharge planning process, led by the attending physician.  Recommendations may be updated based on patient status, additional functional criteria and insurance authorization.  Follow Up Recommendations  Skilled nursing-short term rehab (<3 hours/day) Can patient physically be transported by private vehicle: No   Assistance Recommended at Discharge Frequent or constant Supervision/Assistance  Patient can return home with the following A lot of help with walking and/or transfers;A lot of help with bathing/dressing/bathroom;Direct supervision/assist for medications management;Direct supervision/assist for financial management   Equipment Recommendations  None recommended by PT     Recommendations for Other Services       Precautions / Restrictions Precautions Precautions: Fall Precaution Comments: Watch HR, coretrack Restrictions Weight Bearing Restrictions: No     Mobility  Bed Mobility Overal bed mobility: Needs Assistance Bed Mobility: Supine to Sit, Sit to Supine     Supine to sit: Max assist Sit to supine: Max assist   General bed mobility comments: total support as pt is fairly weak, concentration on posture in sitting as pt with eyes closed and with increased lethargy throughout, unable to support self    Transfers Overall transfer level: Needs assistance                 General transfer comment: deferred secondary to lethargy    Ambulation/Gait               General Gait Details: deferred   Stairs             Wheelchair Mobility    Modified Rankin (Stroke Patients Only)       Balance Overall balance assessment: Needs assistance Sitting-balance support: Feet supported, Bilateral upper extremity supported Sitting balance-Leahy Scale: Poor Sitting balance - Comments: Pt sat EOB for 15 min. Poor sitting balance, very slumped anteriorly and to RT with initial inability to lift head /chin off chest.  Pt provided with 5 rep sustained thoracic extension stretches, pec stretches and assisted head to neutral. pt unable to lift head this session or maintain head in neutral without total assist Postural control: Posterior lean, Right lateral lean (anterior lean)                                  Cognition Arousal/Alertness: Awake/alert Behavior During Therapy: WFL for tasks assessed/performed Overall Cognitive Status: History  of cognitive impairments - at baseline                                 General Comments: Responds well to repitition, 1-step slow commands.        Exercises Other Exercises Other Exercises: supine passive streatch of cervial spine with manual therapy at neck as pt  grimiacing in pain throughtout session and unable to hold/move head to neutral with chin on chest throughout session without total assist, pt able to keep head resting on bed post stretch without chin falling to chest with  bed elevated to 45 degrees    General Comments        Pertinent Vitals/Pain Pain Assessment Pain Assessment: Faces Faces Pain Scale: Hurts even more Pain Location: neck Pain Descriptors / Indicators: Grimacing, Sore Pain Intervention(s): Monitored during session, Limited activity within patient's tolerance, Repositioned    Home Living                          Prior Function            PT Goals (current goals can now be found in the care plan section) Acute Rehab PT Goals PT Goal Formulation: With patient Time For Goal Achievement: 10/13/22    Frequency    Min 3X/week      PT Plan      Co-evaluation              AM-PAC PT "6 Clicks" Mobility   Outcome Measure  Help needed turning from your back to your side while in a flat bed without using bedrails?: A Lot Help needed moving from lying on your back to sitting on the side of a flat bed without using bedrails?: A Lot Help needed moving to and from a bed to a chair (including a wheelchair)?: Total Help needed standing up from a chair using your arms (e.g., wheelchair or bedside chair)?: Total Help needed to walk in hospital room?: Total Help needed climbing 3-5 steps with a railing? : Total 6 Click Score: 8    End of Session   Activity Tolerance: Patient limited by lethargy;Patient limited by fatigue Patient left: in bed;with call bell/phone within reach;with bed alarm set Nurse Communication: Mobility status PT Visit Diagnosis: Muscle weakness (generalized) (M62.81);Other abnormalities of gait and mobility (R26.89);Difficulty in walking, not elsewhere classified (R26.2)     Time: 4503-8882 PT Time Calculation (min) (ACUTE ONLY): 23 min  Charges:  $Therapeutic Activity:  23-37 mins                     Daniela Hernan R. PTA Acute Rehabilitation Services Office: [place] 10/04/2022, 2:30 PM

## 2022-10-04 NOTE — Progress Notes (Addendum)
ANTICOAGULATION CONSULT NOTE - Follow Up Consult  Pharmacy Consult for Warfarin Indication: MVR  Allergies  Allergen Reactions   Zoloft [Sertraline] Hives, Itching and Rash    Patient Measurements: Height: '5\' 6"'$  (167.6 cm) Weight: 50.2 kg (110 lb 9.6 oz) IBW/kg (Calculated) : 59.3  Vital Signs: Temp: 97.5 F (36.4 C) (11/27 0413) Temp Source: Oral (11/27 0005) BP: 125/80 (11/27 0413) Pulse Rate: 93 (11/27 0413)  Labs: Recent Labs    10/02/22 0708 10/03/22 0321 10/04/22 0348  HGB 11.3*  --  12.4  HCT 35.0*  --  37.5  PLT 150  --  157  LABPROT 39.4* 26.0* 18.1*  INR 4.1* 2.4* 1.5*  CREATININE 0.61 0.59  --      Estimated Creatinine Clearance: 47.4 mL/min (by C-G formula based on SCr of 0.59 mg/dL).   Medications:  Scheduled:    stroke: early stages of recovery book   Does not apply Once   aspirin  81 mg Per Tube Daily   atorvastatin  40 mg Per Tube Daily   famotidine  20 mg Per Tube BID   feeding supplement (PROSource TF20)  60 mL Per Tube Daily   free water  200 mL Per Tube Q8H   insulin aspart  0-15 Units Subcutaneous Q4H   metoprolol tartrate  12.5 mg Per Tube BID   PARoxetine  10 mg Per Tube q AM   Warfarin - Pharmacist Dosing Inpatient   Does not apply q1600   Infusions:   feeding supplement (JEVITY 1.5 CAL/FIBER) 1,000 mL (10/03/22 0727)   methocarbamol (ROBAXIN) IV      Assessment: 76 yo F presenting with stoke sx, hx of MVR on warfarin PTA with last dose 11/20. INR on admission supratherapeutic at 4.3. Per medication history obtained from SNF - patient's last PTA dose of warfarin was 10 mg given on 11/20 at ~1700.   Todays INR is supratherapeutic at 3.9. Hgb 11.0, plt 146 - stable. No signs/symptoms of bleeding noted.   Patients INR went from 3.2 to 3.9 with one Warfarin 10 mg dose. Will plan to hold tonight's dose to return to goal range.      PTA dosing: Alternating 10 mg with 5 mg daily   10/04/2022 AM update INR 1.5 Tea colored urine but  no frank bleeding per nursing. This has been on going  Goal of Therapy:  INR 2.5-3.5 Monitor platelets by anticoagulation protocol: Yes   Plan:  Warfarin 7.5 mg po x1  Continue daily INR.  Monitor CBC and for signs/symptoms of bleeding.  Vaughan Basta BS, PharmD, BCPS Clinical Pharmacist 10/04/2022 6:11 AM  Contact: [phone] after 3 PM  "Be curious, not judgmental..." -Jamal Maes

## 2022-10-05 DIAGNOSIS — I10 Essential (primary) hypertension: Secondary | ICD-10-CM | POA: Diagnosis not present

## 2022-10-05 DIAGNOSIS — Z7189 Other specified counseling: Secondary | ICD-10-CM | POA: Diagnosis not present

## 2022-10-05 DIAGNOSIS — I639 Cerebral infarction, unspecified: Secondary | ICD-10-CM | POA: Diagnosis not present

## 2022-10-05 DIAGNOSIS — Z515 Encounter for palliative care: Secondary | ICD-10-CM | POA: Diagnosis not present

## 2022-10-05 DIAGNOSIS — R1312 Dysphagia, oropharyngeal phase: Secondary | ICD-10-CM | POA: Diagnosis not present

## 2022-10-05 DIAGNOSIS — Z7901 Long term (current) use of anticoagulants: Secondary | ICD-10-CM | POA: Diagnosis not present

## 2022-10-05 DIAGNOSIS — R791 Abnormal coagulation profile: Secondary | ICD-10-CM | POA: Diagnosis not present

## 2022-10-05 LAB — BASIC METABOLIC PANEL
Anion gap: 8 (ref 5–15)
BUN: 28 mg/dL — ABNORMAL HIGH (ref 8–23)
CO2: 29 mmol/L (ref 22–32)
Calcium: 9 mg/dL (ref 8.9–10.3)
Chloride: 99 mmol/L (ref 98–111)
Creatinine, Ser: 0.67 mg/dL (ref 0.44–1.00)
GFR, Estimated: >60 mL/min (ref >60)
Glucose, Bld: 168 mg/dL — ABNORMAL HIGH (ref 70–99)
Potassium: 3.8 mmol/L (ref 3.5–5.1)
Sodium: 136 mmol/L (ref 135–145)

## 2022-10-05 LAB — HEPARIN LEVEL (UNFRACTIONATED)
Heparin Unfractionated: 0.23 IU/mL — ABNORMAL LOW (ref 0.30–0.70)
Heparin Unfractionated: 0.23 IU/mL — ABNORMAL LOW (ref 0.30–0.70)

## 2022-10-05 LAB — CBC
HCT: 35 % — ABNORMAL LOW (ref 36.0–46.0)
Hemoglobin: 11.3 g/dL — ABNORMAL LOW (ref 12.0–15.0)
MCH: 27 pg (ref 26.0–34.0)
MCHC: 32.3 g/dL (ref 30.0–36.0)
MCV: 83.7 fL (ref 80.0–100.0)
Platelets: 153 10*3/uL (ref 150–400)
RBC: 4.18 MIL/uL (ref 3.87–5.11)
RDW: 15.8 % — ABNORMAL HIGH (ref 11.5–15.5)
WBC: 6.8 10*3/uL (ref 4.0–10.5)
nRBC: 0 % (ref 0.0–0.2)

## 2022-10-05 LAB — GLUCOSE, CAPILLARY
Glucose-Capillary: 121 mg/dL — ABNORMAL HIGH (ref 70–99)
Glucose-Capillary: 136 mg/dL — ABNORMAL HIGH (ref 70–99)
Glucose-Capillary: 141 mg/dL — ABNORMAL HIGH (ref 70–99)
Glucose-Capillary: 146 mg/dL — ABNORMAL HIGH (ref 70–99)
Glucose-Capillary: 146 mg/dL — ABNORMAL HIGH (ref 70–99)
Glucose-Capillary: 152 mg/dL — ABNORMAL HIGH (ref 70–99)
Glucose-Capillary: 153 mg/dL — ABNORMAL HIGH (ref 70–99)
Glucose-Capillary: 161 mg/dL — ABNORMAL HIGH (ref 70–99)

## 2022-10-05 LAB — PROTIME-INR
INR: 1.6 — ABNORMAL HIGH (ref 0.8–1.2)
Prothrombin Time: 18.4 seconds — ABNORMAL HIGH (ref 11.4–15.2)

## 2022-10-05 LAB — MAGNESIUM: Magnesium: 1.8 mg/dL (ref 1.7–2.4)

## 2022-10-05 LAB — PHOSPHORUS: Phosphorus: 3.5 mg/dL (ref 2.5–4.6)

## 2022-10-05 MED ORDER — POTASSIUM CHLORIDE 20 MEQ PO PACK
40.0000 meq | PACK | Freq: Once | ORAL | Status: AC
  Administered 2022-10-05: 40 meq
  Filled 2022-10-05: qty 2

## 2022-10-05 MED ORDER — WARFARIN SODIUM 7.5 MG PO TABS
7.5000 mg | ORAL_TABLET | Freq: Once | ORAL | Status: AC
  Administered 2022-10-05: 7.5 mg via ORAL
  Filled 2022-10-05: qty 1

## 2022-10-05 MED ORDER — MAGNESIUM SULFATE 2 GM/50ML IV SOLN
2.0000 g | Freq: Once | INTRAVENOUS | Status: AC
  Administered 2022-10-05: 2 g via INTRAVENOUS
  Filled 2022-10-05: qty 50

## 2022-10-05 NOTE — Progress Notes (Signed)
ANTICOAGULATION CONSULT NOTE - Follow Up Consult  Pharmacy Consult for heparin Indication:  MVR in setting of CVA  Labs: Recent Labs    10/02/22 0708 10/03/22 0321 10/04/22 0348 10/04/22 1838 10/05/22 0501  HGB 11.3*  --  12.4  --   --   HCT 35.0*  --  37.5  --   --   PLT 150  --  157  --   --   LABPROT 39.4* 26.0* 18.1*  --  18.4*  INR 4.1* 2.4* 1.5*  --  1.6*  HEPARINUNFRC  --   --   --  0.10* 0.23*  CREATININE 0.61 0.59  --   --  0.67    Assessment: 76yo female remains subtherapeutic on heparin after rate change though closer to goal; no infusion issues or signs of bleeding per RN though urine does appear darker.  Goal of Therapy:  Heparin level 0.3-0.5 units/ml   Plan:  Will increase heparin infusion by 2 units/kg/hr to 850 units/hr and check level in 8 hours.    Wynona Neat, PharmD, BCPS  10/05/2022,6:35 AM

## 2022-10-05 NOTE — Progress Notes (Signed)
[place] for Heparin and Warfarin Indication:  MVR admitted for new CVA  Allergies  Allergen Reactions   Zoloft [Sertraline] Hives, Itching and Rash    Patient Measurements: Height: '5\' 6"'$  (167.6 cm) Weight: 52.6 kg (116 lb) IBW/kg (Calculated) : 59.3  Heparin Dosing Weight: 50 kg  Vital Signs: Temp: 99 F (37.2 C) (11/28 0807) Temp Source: Axillary (11/28 0807) BP: 112/85 (11/28 0807) Pulse Rate: 101 (11/28 0807)  Labs: Recent Labs    10/03/22 0321 10/04/22 0348 10/04/22 1838 10/05/22 0501 10/05/22 1417  HGB  --  12.4  --  11.3*  --   HCT  --  37.5  --  35.0*  --   PLT  --  157  --  153  --   LABPROT 26.0* 18.1*  --  18.4*  --   INR 2.4* 1.5*  --  1.6*  --   HEPARINUNFRC  --   --  0.10* 0.23* 0.23*  CREATININE 0.59  --   --  0.67  --     Estimated Creatinine Clearance: 49.7 mL/min (by C-G formula based on SCr of 0.67 mg/dL).   Assessment: 76 YO female with medical history significant for multiple strokes, recent SAH, and hx MVR on warfarin PTA who presented with R sided weakness found to have a new stroke. INR on admission supratherapeutic at 4.3 with last dose of warfarin '10mg'$  PO administered on 11/20 at ~1700. Pharmacy consulted to resume warfarin, now adding heparin infusion until INR therapeutic.   INR now down 2.4 > 1.5 > 1.6. Received x1 warfarin 7.'5mg'$  PO yesterday. Warfarin had previously been held for 2 days due to elevated INR. Will redose at 7.'5mg'$  PO x1.   Heparin level subtherapeutic at 0.23 on 850 units/hr. CBC stable. Patient still having brown/tea-colored urine but no active signs of bleeding.   Goal of Therapy:  INR 2.5-3.5 Heparin level 0.3-0.5 units/ml Monitor platelets by anticoagulation protocol: Yes   Plan:  Increase heparin infusion to 950 units/hr, no bolus due to stroke Give warfarin 7.5 mg PO x1 dose today Check heparin level in 8 hours and daily while on heparin Check INR daily while on  warfarin Continue to monitor H&H and platelets    Thank you for allowing pharmacy to be a part of this patient's care.  Ardyth Harps, PharmD Clinical Pharmacist

## 2022-10-05 NOTE — Progress Notes (Addendum)
[facility] PROGRESS NOTE   [name] YCX:[id number] DOB: [birth year] DOA: 09/12/2022  PCP: Haywood Pao, MD  Brief History/Interval Summary: 76 y.o. 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      Consultants: Neurology.  Palliative care  Procedures: Transthoracic echocardiogram    Subjective/Interval History: Patient noted to be much more awake and alert this morning though continues to have significant speech impairment.  Overnight events noted.  Noted to have borderline low blood pressures.  She was given IV fluid bolus.      Assessment/Plan:  Acute stroke MRI raised concern for embolic stroke. Patient was already on warfarin.  Was not given TPA. Seen by neurology.  Patient started on aspirin. Echocardiogram shows normal systolic function.  Moderate pericardial effusion was noted. LDL is 81.  HbA1c is 6.2. Patient on statin. PT and OT following.  Skilled nursing facility is recommended for rehab. Patient seen by speech therapy.  Currently on a dysphagia 1 diet.  Also getting tube feedings. No further workup with neurology.  Continue warfarin along with aspirin.  Oropharyngeal dysphagia Speech therapy is following.  Cleared for oral intake with dysphagia 1 diet.   Oral intake has not been adequate over the past few days.  Seems to be more alert today.  Hopefully she will be able to tolerate more feeds by mouth. Continue tube feedings.   Palliative care continues to follow.  It appears that patient and family have decided not to undergo PEG placement.  Wait to see if dysphagia improves in the next 1-2 days. If not, then patient to go to SNF with hospice.  Moderate pericardial effusion Noted on echocardiogram.  No tamponade physiology.  Previous echocardiogram from 2021 showed small pericardial effusion.  TSH noted to be 1.32.  Free  T41.15. Patient followed by Dr. Acie Fredrickson with cardiology. Depending on how patient does over the next 1 to 2 weeks it may be reasonable for her to follow-up with her cardiologist for repeat echocardiogram in a few weeks time.  Essential hypertension Permissive hypertension was allowed.  Amlodipine was placed on hold.  She had to be started on metoprolol for nonsustained ventricular tachycardia. Low blood pressures noted overnight.  Had to be given fluid bolus with improvement in blood pressure.  Etiology unclear.  No evidence of overt bleeding.  Continue to monitor for now.    Nonsustained ventricular tachycardia Likely triggered by hypokalemia.  EF is noted to be normal on echocardiogram.  Started on low-dose beta-blocker.  Electrolytes being monitored. She has since come off of telemetry.  Hypokalemia/hypophosphatemia Will give additional dose of potassium and magnesium today.  Hyperlipidemia Continue statin  History of mitral valve replacement On warfarin prior to admission.  Pharmacy is managing.  Goal INR is between 2.5-3.5.  Started on IV heparin due to subtherapeutic INR.  Normocytic anemia/mild thrombocytopenia No evidence for overt bleeding.  Continue to monitor periodically.  Diabetes mellitus type 2 HbA1c 6.2.  Started on sliding scale coverage as she is now on tube feedings.    Goals of care Palliative care is following.  Patient is DNR.  DVT Prophylaxis: On warfarin Code Status: DNR Family Communication: Husband being updated daily. Disposition Plan: Skilled nursing facility recommended by PT.  Status is: Inpatient Remains inpatient appropriate because: Acute stroke      Medications: Scheduled:   stroke: early stages of recovery book   Does not apply Once  aspirin  81 mg Per Tube Daily   atorvastatin  40 mg Per Tube Daily   famotidine  20 mg Per Tube BID   feeding supplement (PROSource TF20)  60 mL Per Tube Daily   free water  200 mL Per Tube Q8H   insulin  aspart  0-15 Units Subcutaneous Q4H   metoprolol tartrate  12.5 mg Per Tube BID   PARoxetine  10 mg Per Tube q AM   warfarin  7.5 mg Oral ONCE-1600   Warfarin - Pharmacist Dosing Inpatient   Does not apply q1600   Continuous:  feeding supplement (JEVITY 1.5 CAL/FIBER) 1,000 mL (10/05/22 0953)   heparin 850 Units/hr (10/05/22 5427)   methocarbamol (ROBAXIN) IV     CWC:[id number] **OR** acetaminophen (TYLENOL) oral liquid 160 mg/5 mL **OR** acetaminophen, bisacodyl, methocarbamol (ROBAXIN) IV  Antibiotics: Anti-infectives (From admission, onward)    None       Objective:  Vital Signs  Vitals:   10/05/22 0015 10/05/22 0435 10/05/22 0500 10/05/22 0807  BP: 105/67 110/70  112/85  Pulse: (!) 104 (!) 102  (!) 101  Resp: '16 16  17  '$ Temp: 99.1 F (37.3 C) 98.4 F (36.9 C)  99 F (37.2 C)  TempSrc: Oral Axillary  Axillary  SpO2: 99% 95%  98%  Weight:   52.6 kg   Height:        Intake/Output Summary (Last 24 hours) at 10/05/2022 1009 Last data filed at 10/05/2022 0500 Gross per 24 hour  Intake 39 ml  Output 800 ml  Net -761 ml    Filed Weights   10/03/22 0438 10/04/22 0437 10/05/22 0500  Weight: 49.9 kg 50.2 kg 52.6 kg    General appearance: More awake and alert today compared to yesterday.  In no distress. Resp: Clear to auscultation bilaterally.  Normal effort Cardio: S1-S2 is normal regular.  No S3-S4.  No rubs murmurs or bruit GI: Abdomen is soft.  Nontender nondistended.  Bowel sounds are present normal.  No masses organomegaly Able to raise her left arm but not so much with the right arm.  Significant physical deconditioning noted in the lower extremities.    Lab Results:  Data Reviewed: I have personally reviewed following labs and reports of the imaging studies  CBC: Recent Labs  Lab 09/30/22 0222 10/01/22 0238 10/02/22 0708 10/04/22 0348 10/05/22 0501  WBC 6.1 4.9 6.1 8.0 6.8  HGB 11.4* 11.0* 11.3* 12.4 11.3*  HCT 34.7* 34.8* 35.0* 37.5  35.0*  MCV 83.4 85.1 83.7 82.6 83.7  PLT 161 146* 150 157 153     Basic Metabolic Panel: Recent Labs  Lab 09/30/22 0222 10/01/22 0238 10/01/22 0238 10/01/22 1747 10/02/22 0708 10/02/22 1736 10/03/22 0321 10/05/22 0501  NA 141  --   --   --  138  --  140 136  K 3.5  --   --   --  3.3*  --  4.2 3.8  CL 107  --   --   --  107  --  105 99  CO2 25  --   --   --  23  --  25 29  GLUCOSE 97  --   --   --  169*  --  175* 168*  BUN 20  --   --   --  26*  --  28* 28*  CREATININE 0.69  --   --   --  0.61  --  0.59 0.67  CALCIUM 9.3  --   --   --  9.0  --  8.9 9.0  MG  --  1.7   < > 1.7 1.9 2.2 2.0 1.8  PHOS  --  3.3  --  2.6 2.2* 1.8*  --  3.5   < > = values in this interval not displayed.     GFR: Estimated Creatinine Clearance: 49.7 mL/min (by C-G formula based on SCr of 0.67 mg/dL).  Liver Function Tests: Recent Labs  Lab 09/30/22 0222 10/02/22 0708  AST 16 14*  ALT 13 10  ALKPHOS 79 72  BILITOT 0.9 0.4  PROT 5.8* 5.6*  ALBUMIN 3.3* 3.1*      Coagulation Profile: Recent Labs  Lab 10/01/22 0238 10/02/22 0708 10/03/22 0321 10/04/22 0348 10/05/22 0501  INR 3.9* 4.1* 2.4* 1.5* 1.6*     CBG: Recent Labs  Lab 10/04/22 1636 10/04/22 2005 10/05/22 0020 10/05/22 0439 10/05/22 0803  GLUCAP 191* 178* 153* 161* 146*      Radiology Studies: No results found.     LOS: 7 days   Jamelah Sitzer [employer] on [email or web address]  10/05/2022, 10:09 AM

## 2022-10-05 NOTE — Plan of Care (Signed)
  Problem: Education: Goal: Knowledge of disease or condition will improve Outcome: Not Progressing   Problem: Health Behavior/Discharge Planning: Goal: Ability to manage health-related needs will improve Outcome: Not Progressing

## 2022-10-05 NOTE — Progress Notes (Signed)
[facility] night cross cover note:   I was notified by RN that patient's BP now slightly lower relative to day shift, with SBP's now in the 80's mmHg, with MAP in 70's mmHg, relative SBP's during day shift in the low 100's. HR remains in the low 100's consistent with HR's during day shift. Other VS stable, including afebrile with RR 18 and O2 sats in range of 99-100% on RA. Patient w/o acute complaint and per RN, patient is alert and mentating well, with no report of decrease in mentation with these lower BP's.   Per my chart review, patient with poor PO intake over last few days. Has Cortrak a/w continuous TF's at 45 cc/hr.   Home amlodipine being held, and patient is not currently on any anti-hypertensive meds with the exception of BB that was started for NSVT.   Suspect a element of dehydration given the above history and VS trend. I ordered a 1 liter LR bolus following by LR at 75 cc/hr x 10 hours.      Babs Bertin, DO Hospitalist

## 2022-10-05 NOTE — Progress Notes (Signed)
Daily Progress Note   Patient Name: [name]       Date: 10/05/2022 DOB: 29-Apr-1946  Age: 76 y.o. MRN#: [id number] Attending Physician: Bonnielee Haff, MD Primary Care Physician: Haywood Pao, MD Admit Date: 09/18/2022  Reason for Consultation/Follow-up: Establishing goals of care  Subjective: Chart review performed. Received report from primary RN - no acute concerns. RN reports patient's oral intake remains minimal.  Went to visit patient at bedside - no family/visitors present. Patient was lying in bed asleep - she does wake to voice/gentle touch. No signs or non-verbal gestures of pain or discomfort noted. No respiratory distress, increased work of breathing, or secretions noted. Patient denies pain and states she "feels ok." Endorses poor appetite. She remains dysarthric, voice is soft and weak today.  Emotional support provided to patient. Discussed decreased oral intake in context of goals. Counseled on the options of continuing aggressive treatment (PEG, PT/OT/SLP) vs transition to full comfort measures/hospice (no PEG, letting nature take it's course, no therapies). Education provided that, at this time, she is not eating enough to sustain herself long term without tube feeds. Patient tells me she would not want her life prolonged with PEG tube and understands with this option discharge with hospice support would be recommended, which she is agreeable. She would want discharge back to [facility] with hospice. She is motivated to try and increase her oral intake - discussed intake goals. She asks what she should do if brought food she doesn't like - encouraged her to notify staff so they could assist in ordering a new tray with food she prefers; she is happy to hear this. Discussed  option of stopping tube feeds today vs continuing nutritional support while she is working on increasing intake. After discussion, she would like to give coretrak another 24-48 hours while attempting to increase oral intake. If her intake improves, she is open to PT/OT/SLP; however, if it remains poor she is open to hospice/comfort care.  Called patient's husband/[name] - emotional support provided. Provided updates from my and RN assessment as above. Patient does seem more awake compared to yesterday; however, he does recognize her intake remains minimal. After reviewing conversation as outlined above, [name] tells me "we (him and patient) have talked about this before and it is not surprising." Therapeutic listening provided as [name] tells me  about advanced care planning discussions he's had with patient - patient has previously outlined she would not want her life artificially prolonged with "tubes." The conversation had as outlined above is consistent with her previously expressed wishes with [name]. [name] is supportive of her decisions. Prognostication reviewed if patient discharges with hospice and poor appetite per [name]'s request.   All questions and concerns addressed. Encouraged to call with questions and/or concerns. PMT card previously provided.  Length of Stay: 7  Current Medications: Scheduled Meds:    stroke: early stages of recovery book   Does not apply Once   aspirin  81 mg Per Tube Daily   atorvastatin  40 mg Per Tube Daily   famotidine  20 mg Per Tube BID   feeding supplement (PROSource TF20)  60 mL Per Tube Daily   free water  200 mL Per Tube Q8H   insulin aspart  0-15 Units Subcutaneous Q4H   metoprolol tartrate  12.5 mg Per Tube BID   PARoxetine  10 mg Per Tube q AM   warfarin  7.5 mg Oral ONCE-1600   Warfarin - Pharmacist Dosing Inpatient   Does not apply q1600    Continuous Infusions:  feeding supplement (JEVITY 1.5 CAL/FIBER) 1,000 mL (10/05/22 0953)   heparin 850 Units/hr  (10/05/22 0637)   methocarbamol (ROBAXIN) IV      PRN Meds: acetaminophen **OR** acetaminophen (TYLENOL) oral liquid 160 mg/5 mL **OR** acetaminophen, bisacodyl, methocarbamol (ROBAXIN) IV  Physical Exam Vitals and nursing note reviewed.  Constitutional:      General: She is not in acute distress.    Appearance: She is ill-appearing.  Pulmonary:     Effort: No respiratory distress.  Skin:    General: Skin is warm and dry.  Neurological:     Mental Status: She is alert and oriented to person, place, and time.     Motor: Weakness present.  Psychiatric:        Attention and Perception: Attention normal.        Behavior: Behavior is cooperative.        Cognition and Memory: Cognition and memory normal.             Vital Signs: BP 112/85 (BP Location: Left Arm)   Pulse (!) 101   Temp 99 F (37.2 C) (Axillary)   Resp 17   Ht '5\' 6"'$  (1.676 m)   Wt 52.6 kg   SpO2 98%   BMI 18.72 kg/m  SpO2: SpO2: 98 % O2 Device: O2 Device: Room Air O2 Flow Rate:    Intake/output summary:  Intake/Output Summary (Last 24 hours) at 10/05/2022 1248 Last data filed at 10/05/2022 0915 Gross per 24 hour  Intake 104 ml  Output 800 ml  Net -696 ml   LBM: Last BM Date : 10/04/22 Baseline Weight: Weight: 53.6 kg Most recent weight: Weight: 52.6 kg       Palliative Assessment/Data: PPS 30% with tube feeds      Patient Active Problem List   Diagnosis Date Noted   Protein-calorie malnutrition, severe 10/01/2022   DNR (do not resuscitate) 09/14/2022   AMS (altered mental status) 08/28/2022   Constipation 08/28/2022   Gastritis 08/28/2022   Subarachnoid hemorrhage ([place]) 04/30/2022   SAH (subarachnoid hemorrhage) ([place]) 04/26/2022   Goals of care, counseling/discussion    Palliative care by specialist    Acute CVA (cerebrovascular accident) ([place]) 07/01/2020   Subcortical infarction ([place]) 09/05/2019   Anticoagulated on warfarin    Left hemiparesis ([place])  History of CVA with residual  deficit    Thrombocytopenia (HCC)    Prediabetes    Vertigo 09/24/2017   Benign paroxysmal positional vertigo    History of stroke    HLD (hyperlipidemia) 08/23/2017   Encounter for therapeutic drug monitoring 08/08/2017   Long term (current) use of anticoagulants [Z79.01] 07/12/2017   Hemiparesis and alteration of sensations as late effects of stroke ([place]) 09/07/2016   Abnormality of gait 09/07/2016   HTN (hypertension) 04/13/2016   H/O mitral valve replacement with mechanical valve 10/16/2015   Diabetes mellitus without complication (HCC)    Hemiparesis (L sided - mild) due to old stroke [facility])     Palliative Care Assessment & Plan   Patient Profile: 76 y.o. female  with past medical history of  DM, multiple CVAs with hemiparesis, MVR on Coumadin, and HTN presented to ED on 09/15/2022 from [facility] with stroke-like symptoms - left sided gaze, right sided weakness. Patient was admitted on 10/03/2022 with acute CVA.    Of note, patient has had 3 hospital admissions in the last 6 months and is a 30 day readmission.  Assessment: Principal Problem:   Acute CVA (cerebrovascular accident) ([place]) Active Problems:   Diabetes mellitus without complication ([place])   H/O mitral valve replacement with mechanical valve   HTN (hypertension)   HLD (hyperlipidemia)   DNR (do not resuscitate)   Protein-calorie malnutrition, severe   Concern about end of life  Recommendations/Plan: Continue current supportive treatment with another 24-48 hour trial of coretrak Continue DNR/DNI as previously documented Patient wants to try and increase her oral intake over the next 24-48 hours If oral intake does not improve, she does not want her life prolonged with PEG. She is open to return to [facility] with hospice and husband is supportive Ongoing Vancleave pending clinical course PMT will continue to follow and support holistically  Goals of Care and Additional Recommendations: Limitations on Scope of  Treatment: Full Scope Treatment, No Artificial Feeding, and No Tracheostomy  Code Status:    Code Status Orders  (From admission, onward)           Start     Ordered   09/18/2022 1152  Do not attempt resuscitation (DNR)  Continuous       Question Answer Comment  In the event of cardiac or respiratory ARREST Do not call a "code blue"   In the event of cardiac or respiratory ARREST Do not perform Intubation, CPR, defibrillation or ACLS   In the event of cardiac or respiratory ARREST Use medication by any route, position, wound care, and other measures to relive pain and suffering. May use oxygen, suction and manual treatment of airway obstruction as needed for comfort.      09/19/2022 1153           Code Status History     Date Active Date Inactive Code Status Order ID Comments User Context   08/28/2022 0136 08/30/2022 2248 DNR [id number]  Ileene Musa T, DO ED   04/30/2022 1614 06/01/2022 1519 Full Code [id number]  Cathlyn Parsons, PA-C Inpatient   04/30/2022 1614 04/30/2022 1614 Full Code [id number]  Cathlyn Parsons, PA-C Inpatient   04/26/2022 1535 04/30/2022 1611 Full Code [id number]  Lequita Halt, MD ED   07/04/2020 1511 07/16/2020 1649 Full Code [id number]  Cathlyn Parsons, PA-C Inpatient   07/04/2020 1511 07/04/2020 1511 Full Code [id number]  Cathlyn Parsons, PA-C Inpatient   07/02/2020 0253 07/04/2020 1503 Full Code [id number]  Chotiner,  Yevonne Aline, MD ED   09/05/2019 1625 09/15/2019 1404 Full Code [id number]  Cathlyn Parsons, PA-C Inpatient   09/05/2019 1625 09/05/2019 1625 Full Code [id number]  Elizabeth Sauer Inpatient   09/03/2019 0932 09/05/2019 1612 Full Code [id number]  Norval Morton, MD ED   09/24/2017 0352 09/24/2017 2226 Full Code [id number]  Etta Quill, DO ED       Prognosis:  Unable to determine  Discharge Planning: To Be Determined  Care plan was discussed with primary RN, patient, patient's husband  Thank you for allowing the Palliative  Medicine Team to assist in the care of this patient.   Total Time 60 minutes Prolonged Time Billed  no       Greater than 50%  of this time was spent counseling and coordinating care related to the above assessment and plan.  Lin Landsman, NP  Please contact Palliative Medicine Team phone at [phone] for questions and concerns.   *Portions of this note are a verbal dictation therefore any spelling and/or grammatical errors are due to the "[place] [employer]" system interpretation.

## 2022-10-05 NOTE — Progress Notes (Signed)
Occupational Therapy Treatment Patient Details Name: [name] MRN: [id number] DOB: [birth year] Today's Date: 10/05/2022   History of present illness 76 yo female with onset of stroke-like symptoms was admitted on 11/21, now found to have many small scattered acute or early subacute infarcts throughout the infratentorial and supratentorial brain, probably embolic. PMHx:  encephalomalacia, subarachnoid hemorrhage overlying the left greater than right parietaoccipital lobes, post mechanical mitral valve replacement, hemiparesis, HTN, HLD, DM, PNA   OT comments  Pt greeted supine in bed, pt presents with R neck rotation and R gaze preference. Session focus on BADL reeducation, functional mobility,visual scanning, dynamic sitting balance and decreasing overall caregiver burden.   Pt continues to require MAX A +2 for all functional mobility. Pt with moments of min guard for static sitting balance but overall requires up to [facility] for sitting balance. Pt with poor balance reactions noted this session when challenging sitting balance from EOB. Pt required up to total A for ADLS from EOB. Pt was able to stand this session with MAX A +2 from EOB. Worked on light PROM neck rotation stretches with pt preferring to keep neck rotated to R side.               Ended session with pt in bed in L sidelying position with all needs within reach and bed alarm activated.                     Recommendations for follow up therapy are one component of a multi-disciplinary discharge planning process, led by the attending physician.  Recommendations may be updated based on patient status, additional functional criteria and insurance authorization.    Follow Up Recommendations  Skilled nursing-short term rehab (<3 hours/day)     Assistance Recommended at Discharge Frequent or constant Supervision/Assistance  Patient can return home with the following  A lot of help with bathing/dressing/bathroom;Two people to help with  walking and/or transfers;Assist for transportation;Assistance with feeding   Equipment Recommendations  None recommended by OT    Recommendations for Other Services      Precautions / Restrictions Precautions Precautions: Fall Precaution Comments: Watch HR, coretrack Restrictions Weight Bearing Restrictions: No       Mobility Bed Mobility Overal bed mobility: Needs Assistance Bed Mobility: Supine to Sit, Sit to Supine     Supine to sit: Max assist, +2 for physical assistance Sit to supine: Max assist, +2 for physical assistance   General bed mobility comments: pt required step by step cues and MAX A +2 to complete all parts of bed mobility, pt unable to maintain grasp on bed rail with RUE to assist with elevating trunk into sitting    Transfers Overall transfer level: Needs assistance Equipment used: 2 person hand held assist (face to face technique) Transfers: Sit to/from Stand Sit to Stand: Max assist, +2 physical assistance           General transfer comment: sit>stand from EOB with therapist and tech facing pt, pt needed MAX A to elevate trunk and neck into extension, decreased ability extend hips fully and elevate trunk into standing     Balance Overall balance assessment: Needs assistance Sitting-balance support: Feet supported, Bilateral upper extremity supported Sitting balance-Leahy Scale: Poor Sitting balance - Comments: pt sat EOB for ~ 10 mins with one moment of min guard assist for static sitting balance, pt ultimately needed up to MAX A for balance d/t R and posterior lean Postural control: Posterior lean, Right lateral lean Standing  balance support: Bilateral upper extremity supported Standing balance-Leahy Scale: Zero                             ADL either performed or assessed with clinical judgement   ADL Overall ADL's : Needs assistance/impaired     Grooming: Wash/dry face;Sitting;Total assistance Grooming Details (indicate cue  type and reason): pt with no initiation to complete face washing from EOB needing total A from EOB         Upper Body Dressing : Maximal assistance;Bed level Upper Body Dressing Details (indicate cue type and reason): to change gown       Toilet Transfer Details (indicate cue type and reason): unable, MAX A +2 to stand from EOB         Functional mobility during ADLs: Maximal assistance;+2 for physical assistance;+2 for safety/equipment (sit<>stand only) General ADL Comments: pt continues to present with Rt gaze preference, impaired sitting balance, decreased activity tolerance and generalized deconditioning    Extremity/Trunk Assessment Upper Extremity Assessment Upper Extremity Assessment: LUE deficits/detail;RUE deficits/detail RUE Deficits / Details: Hypotonic through shoulder, mild tone to bicep with quick stretch, wrist flaccid, able to open hand partially ~2/3 way and close hand for composite grip. RUE Coordination: decreased fine motor;decreased gross motor LUE Deficits / Details: Dystonic, hand with hyperextension to MPs and PIPs. Shoulder ~2+/5   Lower Extremity Assessment Lower Extremity Assessment: Generalized weakness   Cervical / Trunk Assessment Cervical / Trunk Assessment: Kyphotic    Vision   Vision Assessment?: Vision impaired- to be further tested in functional context Additional Comments: Pt head/gaze with RT preference. Very limited neck rotation to LT but can reach neutral after slow stretches. Cues throughout session to attend to LT UE /visual attention to all activities   Perception Perception Perception: Impaired Inattention/Neglect: Does not attend to left visual field;Other (comment) (needs cues to attend to L side)   Praxis Praxis Praxis: Impaired Praxis Impairment Details: Motor planning    Cognition Arousal/Alertness: Awake/alert Behavior During Therapy: WFL for tasks assessed/performed Overall Cognitive Status: History of cognitive  impairments - at baseline                                 General Comments: pt very soft spoken therefore pt difficult to understand at times but overall following commands during session        Exercises      Shoulder Instructions       General Comments VSS on RA    Pertinent Vitals/ Pain       Pain Assessment Pain Assessment: Faces Faces Pain Scale: Hurts even more Pain Location: geneal grimacing with all mobility especially when trying to stretch neck Pain Descriptors / Indicators: Grimacing, Sore Pain Intervention(s): Limited activity within patient's tolerance, Monitored during session, Repositioned  Home Living                                          Prior Functioning/Environment              Frequency  Min 2X/week        Progress Toward Goals  OT Goals(current goals can now be found in the care plan section)  Progress towards OT goals: Progressing toward goals (gradually)  Acute Rehab OT Goals Patient Stated Goal:  none stated Time For Goal Achievement: 10/13/22 Potential to Achieve Goals: [place] Discharge plan remains appropriate;Frequency remains appropriate    Co-evaluation                 AM-PAC OT "6 Clicks" Daily Activity     Outcome Measure   Help from another person eating meals?: Total Help from another person taking care of personal grooming?: A Lot Help from another person toileting, which includes using toliet, bedpan, or urinal?: A Lot Help from another person bathing (including washing, rinsing, drying)?: A Lot Help from another person to put on and taking off regular upper body clothing?: A Lot Help from another person to put on and taking off regular lower body clothing?: A Lot 6 Click Score: 11    End of Session Equipment Utilized During Treatment: Gait belt  OT Visit Diagnosis: Unsteadiness on feet (R26.81);Muscle weakness (generalized) (M62.81);Ataxia, unspecified (R27.0);Other  symptoms and signs involving cognitive function;Hemiplegia and hemiparesis Hemiplegia - Right/Left: Right Hemiplegia - dominant/non-dominant: Dominant Hemiplegia - caused by: Cerebral infarction   Activity Tolerance Patient tolerated treatment well   Patient Left in bed;with call bell/phone within reach;with bed alarm set;Other (comment) (pt in sidelying to L side)   Nurse Communication Mobility status        Time: 5102-5852 OT Time Calculation (min): 31 min  Charges: OT General Charges $OT Visit: 1 Visit OT Treatments $Self Care/Home Management : 23-37 mins  Harley Alto., COTA/L Acute Rehabilitation Services [phone]   Precious Haws 10/05/2022, 11:47 AM

## 2022-10-06 ENCOUNTER — Inpatient Hospital Stay (HOSPITAL_COMMUNITY): Payer: Medicare Other

## 2022-10-06 DIAGNOSIS — Z952 Presence of prosthetic heart valve: Secondary | ICD-10-CM | POA: Diagnosis not present

## 2022-10-06 DIAGNOSIS — Z66 Do not resuscitate: Secondary | ICD-10-CM | POA: Diagnosis not present

## 2022-10-06 DIAGNOSIS — Z7189 Other specified counseling: Secondary | ICD-10-CM | POA: Diagnosis not present

## 2022-10-06 DIAGNOSIS — I639 Cerebral infarction, unspecified: Secondary | ICD-10-CM | POA: Diagnosis not present

## 2022-10-06 LAB — GLUCOSE, CAPILLARY
Glucose-Capillary: 127 mg/dL — ABNORMAL HIGH (ref 70–99)
Glucose-Capillary: 155 mg/dL — ABNORMAL HIGH (ref 70–99)
Glucose-Capillary: 146 mg/dL — ABNORMAL HIGH (ref 70–99)
Glucose-Capillary: 154 mg/dL — ABNORMAL HIGH (ref 70–99)
Glucose-Capillary: 109 mg/dL — ABNORMAL HIGH (ref 70–99)
Glucose-Capillary: 142 mg/dL — ABNORMAL HIGH (ref 70–99)

## 2022-10-06 LAB — CBC
HCT: 34.1 % — ABNORMAL LOW (ref 36.0–46.0)
Hemoglobin: 10.8 g/dL — ABNORMAL LOW (ref 12.0–15.0)
MCH: 27.3 pg (ref 26.0–34.0)
MCHC: 31.7 g/dL (ref 30.0–36.0)
MCV: 86.3 fL (ref 80.0–100.0)
Platelets: 153 10*3/uL (ref 150–400)
RBC: 3.95 MIL/uL (ref 3.87–5.11)
RDW: 15.6 % — ABNORMAL HIGH (ref 11.5–15.5)
WBC: 6 10*3/uL (ref 4.0–10.5)
nRBC: 0 % (ref 0.0–0.2)

## 2022-10-06 LAB — MAGNESIUM: Magnesium: 2 mg/dL (ref 1.7–2.4)

## 2022-10-06 LAB — PROTIME-INR
INR: 1.9 — ABNORMAL HIGH (ref 0.8–1.2)
Prothrombin Time: 21.6 seconds — ABNORMAL HIGH (ref 11.4–15.2)

## 2022-10-06 LAB — HEPARIN LEVEL (UNFRACTIONATED)
Heparin Unfractionated: 0.37 IU/mL (ref 0.30–0.70)
Heparin Unfractionated: 0.39 IU/mL (ref 0.30–0.70)

## 2022-10-06 MED ORDER — SODIUM CHLORIDE 0.9 % IV BOLUS
250.0000 mL | Freq: Once | INTRAVENOUS | Status: AC
  Administered 2022-10-06: 250 mL via INTRAVENOUS

## 2022-10-06 MED ORDER — LORAZEPAM 2 MG/ML IJ SOLN
INTRAMUSCULAR | Status: AC
  Administered 2022-10-06: 2 mg via INTRAVENOUS
  Filled 2022-10-06: qty 1

## 2022-10-06 MED ORDER — WARFARIN SODIUM 7.5 MG PO TABS
7.5000 mg | ORAL_TABLET | Freq: Once | ORAL | Status: AC
  Administered 2022-10-06: 7.5 mg via ORAL
  Filled 2022-10-06: qty 1

## 2022-10-06 MED ORDER — LORAZEPAM 2 MG/ML IJ SOLN: 2.0000 mg | Freq: Once | INTRAMUSCULAR | Status: AC

## 2022-10-06 MED ORDER — DIGOXIN 0.25 MG/ML IJ SOLN
0.2500 mg | Freq: Once | INTRAMUSCULAR | Status: AC
  Administered 2022-10-06: 0.25 mg via INTRAVENOUS
  Filled 2022-10-06: qty 1

## 2022-10-06 MED ORDER — LEVETIRACETAM IN NACL 1500 MG/100ML IV SOLN
1500.0000 mg | Freq: Once | INTRAVENOUS | Status: AC
  Administered 2022-10-06: 1500 mg via INTRAVENOUS
  Filled 2022-10-06: qty 100

## 2022-10-06 NOTE — Progress Notes (Addendum)
[facility] for Heparin and Warfarin Indication:  MVR admitted for new CVA  Allergies  Allergen Reactions   Zoloft [Sertraline] Hives, Itching and Rash    Patient Measurements: Height: '5\' 6"'$  (167.6 cm) Weight: 113.6 kg (250 lb 7.1 oz) IBW/kg (Calculated) : 59.3  Heparin Dosing Weight: 50 kg  Vital Signs: Temp: 97.6 F (36.4 C) (11/29 1129) Temp Source: Axillary (11/29 1129) BP: 106/67 (11/29 1129) Pulse Rate: 81 (11/29 1129)  Labs: Recent Labs    10/04/22 0348 10/04/22 1838 10/05/22 0501 10/05/22 1417 10/06/22 0251 10/06/22 1231  HGB 12.4  --  11.3*  --  10.8*  --   HCT 37.5  --  35.0*  --  34.1*  --   PLT 157  --  153  --  153  --   LABPROT 18.1*  --  18.4*  --  21.6*  --   INR 1.5*  --  1.6*  --  1.9*  --   HEPARINUNFRC  --    < > 0.23* 0.23* 0.37 0.39  CREATININE  --   --  0.67  --   --   --    < > = values in this interval not displayed.    Estimated Creatinine Clearance: 76.5 mL/min (by C-G formula based on SCr of 0.67 mg/dL).   Assessment: 76 YO female with medical history significant for multiple strokes, recent SAH, and hx MVR on warfarin PTA who presented with R sided weakness found to have a new stroke. INR on admission supratherapeutic at 4.3 with last dose of warfarin '10mg'$  PO administered on 11/20 at ~1700. Pharmacy consulted to resume warfarin, now adding heparin infusion until INR therapeutic.   INR down 2.4 > 1.5, now trending up at 1.9. Warfarin had previously been held for 2 days due to elevated INR. Will redose at 7.'5mg'$  PO x1.   Heparin level therapeutic x2 at 0.37 and 0.39 on 950 units/hr. CBC stable. Patient still having brown/tea-colored urine but no active signs of bleeding.   Goal of Therapy:  INR 2.5-3.5 Heparin level 0.3-0.5 units/ml Monitor platelets by anticoagulation protocol: Yes   Plan:  Continue heparin infusion to 950 units/hr, no bolus due to stroke Give warfarin 7.5 mg PO x1 dose  today Check heparin level daily while on heparin Check INR daily while on warfarin Continue to monitor H&H and platelets    Thank you for allowing pharmacy to be a part of this patient's care.  Ardyth Harps, PharmD Clinical Pharmacist

## 2022-10-06 NOTE — Progress Notes (Signed)
ANTICOAGULATION CONSULT NOTE - Follow Up Consult  Pharmacy Consult for heparin Indication:  MVR in setting of CVA  Labs: Recent Labs    10/04/22 0348 10/04/22 1838 10/05/22 0501 10/05/22 1417 10/06/22 0251  HGB 12.4  --  11.3*  --  10.8*  HCT 37.5  --  35.0*  --  34.1*  PLT 157  --  153  --  153  LABPROT 18.1*  --  18.4*  --  21.6*  INR 1.5*  --  1.6*  --  1.9*  HEPARINUNFRC  --    < > 0.23* 0.23* 0.37  CREATININE  --   --  0.67  --   --    < > = values in this interval not displayed.    Assessment/Plan:  76yo female therapeutic on heparin after rate changes. Will continue infusion at current rate of 950 units/hr and confirm stable with additional level.    [name], PharmD, BCPS  10/06/2022,3:36 AM

## 2022-10-06 NOTE — Plan of Care (Signed)
  Problem: Ischemic Stroke/TIA Tissue Perfusion: Goal: Complications of ischemic stroke/TIA will be minimized Outcome: Progressing   Problem: Health Behavior/Discharge Planning: Goal: Goals will be collaboratively established with patient/family Outcome: Progressing   Problem: Nutrition: Goal: Risk of aspiration will decrease Outcome: Progressing Goal: Dietary intake will improve Outcome: Progressing   Problem: Clinical Measurements: Goal: Ability to maintain clinical measurements within normal limits will improve Outcome: Progressing Goal: Will remain free from infection Outcome: Progressing Goal: Diagnostic test results will improve Outcome: Progressing Goal: Respiratory complications will improve Outcome: Progressing Goal: Cardiovascular complication will be avoided Outcome: Progressing   Problem: Nutrition: Goal: Adequate nutrition will be maintained Outcome: Progressing   Problem: Pain Managment: Goal: General experience of comfort will improve Outcome: Progressing   Problem: Safety: Goal: Ability to remain free from injury will improve Outcome: Progressing   Problem: Skin Integrity: Goal: Risk for impaired skin integrity will decrease Outcome: Progressing   Problem: Skin Integrity: Goal: Risk for impaired skin integrity will decrease Outcome: Progressing   Problem: Education: Goal: Knowledge of disease or condition will improve Outcome: Not Progressing Goal: Knowledge of secondary prevention will improve (MUST DOCUMENT ALL) Outcome: Not Progressing Goal: Knowledge of patient specific risk factors will improve [place] N/A or DELETE if not current risk factor) Outcome: Not Progressing

## 2022-10-06 NOTE — Progress Notes (Signed)
Physical Therapy Treatment Patient Details Name: [name] MRN: [id number] DOB: Jan 10, 1946 Today's Date: 10/06/2022   History of Present Illness 76 yo female with onset of stroke-like symptoms was admitted on 11/21, now found to have many small scattered acute or early subacute infarcts throughout the infratentorial and supratentorial brain, probably embolic. PMHx:  encephalomalacia, subarachnoid hemorrhage overlying the left greater than right parietaoccipital lobes, post mechanical mitral valve replacement, hemiparesis, HTN, HLD, DM, PNA    PT Comments    Pt received supine with RN present. Pt minimally verbal this session, only moaning out in pain, unable to communicate pain location and unable to answer yes/no questions, making eye contact and tracking RN and this PTA. Pt needing max assist to roll R/L for peri care and to boost in bed and reposition. Further mobility deferred this date secondary to pt pain and lethargy, despite pre-medication. Will continue to follow acutely to progress mobility within pt tolerance.    Recommendations for follow up therapy are one component of a multi-disciplinary discharge planning process, led by the attending physician.  Recommendations may be updated based on patient status, additional functional criteria and insurance authorization.  Follow Up Recommendations  Skilled nursing-short term rehab (<3 hours/day) Can patient physically be transported by private vehicle: No   Assistance Recommended at Discharge Frequent or constant Supervision/Assistance  Patient can return home with the following A lot of help with walking and/or transfers;A lot of help with bathing/dressing/bathroom;Direct supervision/assist for medications management;Direct supervision/assist for financial management   Equipment Recommendations  None recommended by PT    Recommendations for Other Services       Precautions / Restrictions Precautions Precautions:  Fall Precaution Comments: Watch HR, coretrack Restrictions Weight Bearing Restrictions: No     Mobility  Bed Mobility Overal bed mobility: Needs Assistance Bed Mobility: Rolling Rolling: Max assist, Mod assist         General bed mobility comments: max assist to L, mod assist to R pt moaning throughout, unable to communicate wlocation of pain and not verbalizing to this PTA or RN    Transfers                   General transfer comment: deferred    Ambulation/Gait               General Gait Details: deferred   Stairs             Wheelchair Mobility    Modified Rankin (Stroke Patients Only)       Balance Overall balance assessment: Needs assistance Sitting-balance support: Feet supported, Bilateral upper extremity supported Sitting balance-Leahy Scale: Poor                                      Cognition Arousal/Alertness: Awake/alert Behavior During Therapy: WFL for tasks assessed/performed Overall Cognitive Status: History of cognitive impairments - at baseline                                 General Comments: pt very soft spoken therefore pt difficult to understand, able to follow commands, moaning in pain throughout bed mobility        Exercises Other Exercises Other Exercises: passive stretch of bil elbows from flexed past 90 with pt unable to tolerate    General Comments General comments (skin integrity, edema, etc.): VSS  on RA      Pertinent Vitals/Pain Pain Assessment Pain Assessment: Faces Faces Pain Scale: Hurts whole lot Consolability: no need to console Pain Location: geneal grimacing with all mobility especially when trying to stretch neck Pain Descriptors / Indicators: Grimacing, Sore Pain Intervention(s): Monitored during session, Limited activity within patient's tolerance, Repositioned, Premedicated before session    Home Living                          Prior Function             PT Goals (current goals can now be found in the care plan section) Acute Rehab PT Goals Patient Stated Goal: unable to state PT Goal Formulation: With patient Time For Goal Achievement: 10/13/22    Frequency    Min 3X/week      PT Plan      Co-evaluation              AM-PAC PT "6 Clicks" Mobility   Outcome Measure  Help needed turning from your back to your side while in a flat bed without using bedrails?: A Lot Help needed moving from lying on your back to sitting on the side of a flat bed without using bedrails?: A Lot Help needed moving to and from a bed to a chair (including a wheelchair)?: Total Help needed standing up from a chair using your arms (e.g., wheelchair or bedside chair)?: Total Help needed to walk in hospital room?: Total Help needed climbing 3-5 steps with a railing? : Total 6 Click Score: 8    End of Session   Activity Tolerance: Patient limited by lethargy;Patient limited by pain Patient left: in bed;with call bell/phone within reach;with bed alarm set Nurse Communication: Mobility status PT Visit Diagnosis: Muscle weakness (generalized) (M62.81);Other abnormalities of gait and mobility (R26.89);Difficulty in walking, not elsewhere classified (R26.2)     Time: 1510-1530 PT Time Calculation (min) (ACUTE ONLY): 20 min  Charges:  $Therapeutic Activity: 8-22 mins                     Chaney Maclaren R. PTA Acute Rehabilitation Services Office: [place] 10/06/2022, 5:03 PM

## 2022-10-06 NOTE — Progress Notes (Signed)
[facility] PROGRESS NOTE   DEVONE BONILLA SWF:[id number] DOB: 1946/09/06 DOA: 09/18/2022  PCP: Haywood Pao, MD  Brief History/Interval Summary: 76 y.o. 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      Consultants: Neurology.  Palliative care  Procedures: Transthoracic echocardiogram  Subjective/Interval History: Patient noted to be much more awake and alert this morning though continues to have significant speech impairment.  Lengthy discussion with husband at bedside about patient's prognosis and goals of care.  Assessment/Plan:  Acute stroke MRI raised concern for embolic stroke. Patient was already on warfarin and thus was not a candidate for TPA. Neurology following recommended addition of aspirin Echocardiogram shows normal systolic function.  Moderate pericardial effusion was noted. LDL is 81.  HbA1c is 6.2. Continue statin PT and OT recommending SNF Patient seen by speech therapy.  Currently on a dysphagia 1 diet.  Also getting tube feedings.  Patient will be unable to discharge until she is able to tolerate appropriate caloric and fluid intake on her own.  See below.  Oropharyngeal dysphagia Speech therapy is following.  Continue to advance diet as tolerated Palliative care following, discussed with husband that if patient is unable to tolerate appropriate caloric and free water intake we would have to discuss PEG tube placement however patient has previously voiced she would not want a PEG tube placed As such we will continue to advance patient's diet as tolerated, over the next few days will need to discuss with family goals of care if patient's p.o. intake does not improve appropriately.  Moderate pericardial effusion Noted on echocardiogram.  No tamponade physiology.  Previous echocardiogram from 2021 showed small pericardial effusion.  TSH  noted to be 1.32.  Free T41.15. Patient followed by Dr. Acie Fredrickson with cardiology. Consider repeat echo in 1 to 2 weeks with cardiology if she continues to improve.  Essential hypertension with borderline hypotension Permissive hypertension window has closed.   Continue metoprolol low-dose given ventricular tachycardia as below, adjust as necessary Home amlodipine previously discontinued  Nonsustained ventricular tachycardia Likely triggered by hypokalemia.   EF is noted to be normal on echocardiogram.   Continue metoprolol  Hypokalemia/hypophosphatemia Continue to monitor  Hyperlipidemia Continue statin  History of mitral valve replacement On warfarin prior to admission.  Pharmacy is managing.  Goal INR is between 2.5-3.5.  Started on IV heparin due to subtherapeutic INR.  Normocytic anemia/mild thrombocytopenia No evidence for overt bleeding.  Continue to monitor periodically.  Diabetes mellitus type 2 HbA1c 6.2.  Started on sliding scale coverage as she is now on tube feedings.    Goals of care Palliative care is following.  Patient is DNR.  DVT Prophylaxis: On warfarin Code Status: DNR Family Communication: Husband updated at bedside Disposition Plan: Skilled nursing facility recommended by PT -will need to be able to tolerate appropriate caloric intake for NG tube removal prior to discharge.  Status is: Inpatient Remains inpatient appropriate because: Acute stroke  Medications: Scheduled:   stroke: early stages of recovery book   Does not apply Once   aspirin  81 mg Per Tube Daily   atorvastatin  40 mg Per Tube Daily   famotidine  20 mg Per Tube BID   feeding supplement (PROSource TF20)  60 mL Per Tube Daily   free water  200 mL Per Tube Q8H   insulin aspart  0-15 Units Subcutaneous Q4H   metoprolol tartrate  12.5 mg Per Tube BID   PARoxetine  10 mg Per Tube q AM   warfarin  7.5 mg Oral ONCE-1600   Warfarin - Pharmacist Dosing Inpatient   Does not apply q1600    Continuous:  feeding supplement (JEVITY 1.5 CAL/FIBER) 1,000 mL (10/05/22 0953)   heparin 950 Units/hr (10/05/22 1550)   methocarbamol (ROBAXIN) IV 500 mg (10/06/22 0324)   [id number] **OR** acetaminophen (TYLENOL) oral liquid 160 mg/5 mL **OR** acetaminophen, bisacodyl, methocarbamol (ROBAXIN) IV  Antibiotics: Anti-infectives (From admission, onward)    None       Objective:  Vital Signs  Vitals:   10/05/22 2004 10/05/22 2349 10/06/22 0329 10/06/22 0442  BP: 109/73 123/89 123/69   Pulse: 98 89 89   Resp: 16 18 (!) 21   Temp: 98.4 F (36.9 C) 98.2 F (36.8 C) 98.4 F (36.9 C)   TempSrc: Axillary Oral Oral   SpO2: 96% 97% 97%   Weight:    113.6 kg  Height:        Intake/Output Summary (Last 24 hours) at 10/06/2022 0759 Last data filed at 10/06/2022 0353 Gross per 24 hour  Intake 171.7 ml  Output 650 ml  Net -478.3 ml    Filed Weights   10/04/22 0437 10/05/22 0500 10/06/22 0442  Weight: 50.2 kg 52.6 kg 113.6 kg    General appearance: Awake, minimally interactive, following simple commands Resp: Clear to auscultation bilaterally.  Normal effort Cardio: S1-S2 is normal regular.  No S3-S4.  No rubs murmurs or bruit GI: Abdomen is soft.  Nontender nondistended.  Bowel sounds are present normal.  No masses organomegaly Msk: Moving all 4 limbs spontaneously  Lab Results:  Data Reviewed: I have personally reviewed following labs and reports of the imaging studies  CBC: Recent Labs  Lab 10/01/22 0238 10/02/22 0708 10/04/22 0348 10/05/22 0501 10/06/22 0251  WBC 4.9 6.1 8.0 6.8 6.0  HGB 11.0* 11.3* 12.4 11.3* 10.8*  HCT 34.8* 35.0* 37.5 35.0* 34.1*  MCV 85.1 83.7 82.6 83.7 86.3  PLT 146* 150 157 153 153     Basic Metabolic Panel: Recent Labs  Lab 09/30/22 0222 10/01/22 0238 10/01/22 0238 10/01/22 1747 10/02/22 0708 10/02/22 1736 10/03/22 0321 10/05/22 0501  NA 141  --   --   --  138  --  140 136  K 3.5  --   --   --  3.3*  --  4.2  3.8  CL 107  --   --   --  107  --  105 99  CO2 25  --   --   --  23  --  25 29  GLUCOSE 97  --   --   --  169*  --  175* 168*  BUN 20  --   --   --  26*  --  28* 28*  CREATININE 0.69  --   --   --  0.61  --  0.59 0.67  CALCIUM 9.3  --   --   --  9.0  --  8.9 9.0  MG  --  1.7   < > 1.7 1.9 2.2 2.0 1.8  PHOS  --  3.3  --  2.6 2.2* 1.8*  --  3.5   < > = values in this interval not displayed.     GFR: Estimated Creatinine Clearance: 76.5 mL/min (by C-G formula based on SCr of 0.67 mg/dL).  Liver Function Tests: Recent Labs  Lab 09/30/22 0222 10/02/22 0708  AST  16 14*  ALT 13 10  ALKPHOS 79 72  BILITOT 0.9 0.4  PROT 5.8* 5.6*  ALBUMIN 3.3* 3.1*      Coagulation Profile: Recent Labs  Lab 10/02/22 0708 10/03/22 0321 10/04/22 0348 10/05/22 0501 10/06/22 0251  INR 4.1* 2.4* 1.5* 1.6* 1.9*     CBG: Recent Labs  Lab 10/05/22 1540 10/05/22 2006 10/05/22 2133 10/05/22 2350 10/06/22 0323  GLUCAP 152* 141* 136* 146* 127*      Radiology Studies: No results found.     LOS: 8 days   Little Ishikawa  [facility] Pager on [email or web address]  10/06/2022, 7:59 AM

## 2022-10-06 NOTE — Significant Event (Signed)
Rapid Response Event Note   Reason for Call :  Seizure  Per RN, pt began seizing at 1900.   Initial Focused Assessment:  Pt having R sided seizure with L sided gaze. SpO2-99%, HR-133. Ativan '2mg'$  given at 1913 which terminated seizure. Pt began to seize again at 2120. Pt was given additional '2mg'$  ativan at [place], again terminating seizure.  After seizure terminated, pt will respond to painful stimulation, will not follow commands or speak. Pt having periods of snoring respirations. Lungs clear t/o. Skin diaphoretic.   HR-130s, SBP-100s, RR-30s, SpO2-99% on RA.  Interventions:  '2mg'$  Ativan at 1913 and additional '2mg'$  at 1922 '1500mg'$  Keppra at 1932 Seizure precautions EEG Neuro re consulted Plan of Care:  Await Neuro consult. Continue to monitor pt closely. Call RRT if further assistance needed.   Event Summary:   MD Notified: Dr. Sidney Ace notified and came to bedside Call [place]  Dillard Essex, RN

## 2022-10-06 NOTE — Progress Notes (Signed)
Daily Progress Note   Patient Name: [name]       Date: 10/06/2022 DOB: 1945/12/17  Age: 76 y.o. MRN#: [id number] Attending Physician: Little Ishikawa, MD Primary Care Physician: Haywood Pao, MD Admit Date: 09/17/2022  Reason for Consultation/Follow-up: Establishing goals of care  Subjective: Chart review performed including progress notes, labs, imaging.  Patient assessed at the bedside.  She is alert, attempts to say "no" when asked if in pain or distress, but does not respond very much to other questions or attempts at conversation.  Discussed with RN.  Patient was about the same yesterday, with waxing and waning mental status.  RN reports patient had decreasing oral intake for lunch and dinner last night when compared to yesterday's breakfast.  Patient also noted to have coughing spell during dinner after her second bite and again this morning.  Patient's husband is visiting at the bedside.  He confirms patient had a cough with breakfast when he fed her today and he informed the RN.  Attempts to feed were then stopped.  Patient states coughing spell this morning was "a little" bothersome.  She denies lingering symptoms at this time.  Explored her thoughts on coretrak and whether to continue this for another day as discussed with my colleague.  We discussed the concept of weighing the positives and negatives of this intervention in the context of her overall quality of life.  [name] asks "would they take the tube out today?"  And "is there any hope without it?"  I confirmed that if patient or himself share that this is causing her too much suffering and no longer something she wants to do, then we could discontinue the coretrak and transition to comfort care today.  We also discussed  that patient's prognosis would be shorter in this case, counseling on the process of careful hand feeding at SNF for comfort and pleasure.  He verbalizes his understanding.  Patient has not talked to him much about her feelings, and when she does make attempts he is still having difficulty understanding her statements.  We discussed the option to give her another 24 hours of watchful waiting and reconvene tomorrow to discuss further.  He is agreeable.  All questions and concerns addressed. Encouraged to call with questions and/or concerns. PMT card previously provided.  Length of  Stay: 8  Physical Exam Vitals and nursing note reviewed.  Constitutional:      General: She is awake. She is not in acute distress.    Appearance: She is ill-appearing.  Cardiovascular:     Rate and Rhythm: Normal rate.  Pulmonary:     Effort: No respiratory distress.  Skin:    General: Skin is warm and dry.  Neurological:     Mental Status: She is lethargic.     Motor: Weakness present.  Psychiatric:        Speech: Speech is delayed.            Vital Signs: BP 100/75 (BP Location: Left Arm)   Pulse 90   Temp 97.6 F (36.4 C) (Axillary)   Resp 18   Ht '5\' 6"'$  (1.676 m)   Wt 113.6 kg   SpO2 98%   BMI 40.42 kg/m  SpO2: SpO2: 98 % O2 Device: O2 Device: Room Air O2 Flow Rate:    Palliative Assessment/Data: PPS 30% with tube feeds   Palliative Care Assessment & Plan   Patient Profile: 76 y.o. female  with past medical history of  DM, multiple CVAs with hemiparesis, MVR on Coumadin, and HTN presented to ED on 09/17/2022 from [facility] with stroke-like symptoms - left sided gaze, right sided weakness. Patient was admitted on 09/16/2022 with acute CVA.    Of note, patient has had 3 hospital admissions in the last 6 months and is a 30 day readmission.  Assessment: Principal Problem:   Acute CVA (cerebrovascular accident) ([place]) Active Problems:   Diabetes mellitus without complication ([place])   H/O  mitral valve replacement with mechanical valve   HTN (hypertension)   HLD (hyperlipidemia)   DNR (do not resuscitate)   Protein-calorie malnutrition, severe    Recommendations/Plan: Continue DNR Continue current care and coretrak trial for another 24 hours, GOC discussion limited by patient's mental status this morning Ongoing goals of care discussions pending clinical course; transition to comfort care and hospice if no improvement Psychosocial and emotional support provided PMT will continue to follow and support   Prognosis: Guarded  Discharge Planning: To Be Determined  Care plan was discussed with patient, patient's husband   MDM: High   Ivi Griffith Gregary Signs Palliative Medicine Team Team phone # [phone]  Thank you for allowing the Palliative Medicine Team to assist in the care of this patient. Please utilize secure chat with additional questions, if there is no response within 30 minutes please call the above phone number.  Palliative Medicine Team providers are available by phone from 7am to 7pm daily and can be reached through the team cell phone.  Should this patient require assistance outside of these hours, please call the patient's attending physician.

## 2022-10-06 NOTE — Plan of Care (Signed)
  Problem: Education: Goal: Knowledge of disease or condition will improve Outcome: Not Progressing   Problem: Health Behavior/Discharge Planning: Goal: Ability to manage health-related needs will improve Outcome: Not Progressing

## 2022-10-06 NOTE — Progress Notes (Signed)
EEG complete - results pending 

## 2022-10-06 NOTE — Progress Notes (Signed)
Pt found seizing by Gabrielle Needy RN while she entered room to turn off alarm sounding from IV pump.  Charge nurse Baxter Flattery) and pt's primary nurse (Kraven [name]) notified and at bedside. Rapid response called and MD paged. O2 applied and pt turned to side, vitals were recorded throughout episode. See orders and MAR. Primary nurse notified pt's spouse and informed him of situation. Will continue to monitor. [name]

## 2022-10-07 ENCOUNTER — Inpatient Hospital Stay (HOSPITAL_COMMUNITY): Payer: Medicare Other

## 2022-10-07 DIAGNOSIS — Z952 Presence of prosthetic heart valve: Secondary | ICD-10-CM | POA: Diagnosis not present

## 2022-10-07 DIAGNOSIS — I639 Cerebral infarction, unspecified: Secondary | ICD-10-CM | POA: Diagnosis not present

## 2022-10-07 DIAGNOSIS — R569 Unspecified convulsions: Secondary | ICD-10-CM | POA: Diagnosis not present

## 2022-10-07 DIAGNOSIS — Z66 Do not resuscitate: Secondary | ICD-10-CM | POA: Diagnosis not present

## 2022-10-07 DIAGNOSIS — E43 Unspecified severe protein-calorie malnutrition: Secondary | ICD-10-CM | POA: Diagnosis not present

## 2022-10-07 DIAGNOSIS — Z7189 Other specified counseling: Secondary | ICD-10-CM | POA: Diagnosis not present

## 2022-10-07 LAB — CBC
HCT: 36.2 % (ref 36.0–46.0)
Hemoglobin: 11.4 g/dL — ABNORMAL LOW (ref 12.0–15.0)
MCH: 27 pg (ref 26.0–34.0)
MCHC: 31.5 g/dL (ref 30.0–36.0)
MCV: 85.6 fL (ref 80.0–100.0)
Platelets: 196 10*3/uL (ref 150–400)
RBC: 4.23 MIL/uL (ref 3.87–5.11)
RDW: 15.9 % — ABNORMAL HIGH (ref 11.5–15.5)
WBC: 12.6 10*3/uL — ABNORMAL HIGH (ref 4.0–10.5)
nRBC: 0 % (ref 0.0–0.2)

## 2022-10-07 LAB — HEPARIN LEVEL (UNFRACTIONATED): Heparin Unfractionated: 0.49 IU/mL (ref 0.30–0.70)

## 2022-10-07 LAB — GLUCOSE, CAPILLARY
Glucose-Capillary: 127 mg/dL — ABNORMAL HIGH (ref 70–99)
Glucose-Capillary: 225 mg/dL — ABNORMAL HIGH (ref 70–99)

## 2022-10-07 LAB — PROTIME-INR
INR: 3 — ABNORMAL HIGH (ref 0.8–1.2)
Prothrombin Time: 30.9 seconds — ABNORMAL HIGH (ref 11.4–15.2)

## 2022-10-07 MED ORDER — MORPHINE SULFATE (PF) 2 MG/ML IV SOLN
INTRAVENOUS | Status: AC
  Administered 2022-10-07: 2 mg via INTRAVENOUS
  Filled 2022-10-07: qty 1

## 2022-10-07 MED ORDER — MORPHINE SULFATE (PF) 2 MG/ML IV SOLN: 2.0000 mg | INTRAVENOUS | Status: DC | PRN

## 2022-10-07 MED ORDER — GLYCOPYRROLATE 0.2 MG/ML IJ SOLN
0.4000 mg | INTRAMUSCULAR | Status: DC
  Administered 2022-10-07 (×3): 0.4 mg via INTRAVENOUS
  Filled 2022-10-07 (×3): qty 2

## 2022-10-07 MED ORDER — SODIUM CHLORIDE 0.9 % IV SOLN: INTRAVENOUS | Status: DC

## 2022-10-07 MED ORDER — MORPHINE 100MG IN NS 100ML (1MG/ML) PREMIX INFUSION
2.0000 mg/h | INTRAVENOUS | Status: DC
  Administered 2022-10-07: 2 mg/h via INTRAVENOUS
  Filled 2022-10-07: qty 100

## 2022-10-07 MED ORDER — POLYVINYL ALCOHOL 1.4 % OP SOLN: 1.0000 [drp] | Freq: Four times a day (QID) | OPHTHALMIC | Status: DC | PRN

## 2022-10-07 MED ORDER — WARFARIN SODIUM 7.5 MG PO TABS: 7.5000 mg | ORAL_TABLET | Freq: Once | ORAL | Status: DC

## 2022-10-07 MED ORDER — MORPHINE SULFATE (PF) 2 MG/ML IV SOLN
2.0000 mg | INTRAVENOUS | Status: DC | PRN
  Administered 2022-10-07: 2 mg via INTRAVENOUS
  Filled 2022-10-07 (×2): qty 1

## 2022-10-07 MED ORDER — LORAZEPAM 2 MG/ML PO CONC: 1.0000 mg | ORAL | Status: DC | PRN

## 2022-10-07 MED ORDER — GLYCOPYRROLATE 0.2 MG/ML IJ SOLN: 0.4000 mg | INTRAMUSCULAR | Status: DC

## 2022-10-07 MED ORDER — WARFARIN SODIUM 5 MG PO TABS: 5.0000 mg | ORAL_TABLET | Freq: Once | ORAL | Status: DC

## 2022-10-07 MED ORDER — MORPHINE SULFATE (CONCENTRATE) 10 MG/0.5ML PO SOLN: 10.0000 mg | ORAL | Status: DC

## 2022-10-07 MED ORDER — MORPHINE BOLUS VIA INFUSION: 1.0000 mg | INTRAVENOUS | Status: DC | PRN

## 2022-10-07 MED ORDER — BIOTENE DRY MOUTH MT LIQD: 15.0000 mL | OROMUCOSAL | Status: DC | PRN

## 2022-10-07 MED ORDER — LEVETIRACETAM IN NACL 500 MG/100ML IV SOLN
500.0000 mg | Freq: Two times a day (BID) | INTRAVENOUS | Status: DC
  Administered 2022-10-07 (×2): 500 mg via INTRAVENOUS
  Filled 2022-10-07 (×2): qty 100

## 2022-10-07 MED ORDER — ONDANSETRON HCL 4 MG/2ML IJ SOLN: 4.0000 mg | Freq: Four times a day (QID) | INTRAMUSCULAR | Status: DC | PRN

## 2022-10-07 MED ORDER — MORPHINE BOLUS VIA INFUSION
2.0000 mg | INTRAVENOUS | Status: DC | PRN
  Administered 2022-10-07 (×2): 2 mg via INTRAVENOUS

## 2022-10-07 MED ORDER — ONDANSETRON 4 MG PO TBDP: 4.0000 mg | ORAL_TABLET | Freq: Four times a day (QID) | ORAL | Status: DC | PRN

## 2022-10-07 MED ORDER — MORPHINE SULFATE (PF) 2 MG/ML IV SOLN
2.0000 mg | INTRAVENOUS | Status: DC | PRN
  Administered 2022-10-07: 2 mg via INTRAVENOUS
  Filled 2022-10-07: qty 1

## 2022-10-07 MED ORDER — LORAZEPAM 2 MG/ML IJ SOLN: 1.0000 mg | INTRAMUSCULAR | Status: DC | PRN

## 2022-10-07 NOTE — Progress Notes (Signed)
[facility] PROGRESS NOTE   [name] AVW:[id number] DOB: Apr 04, 1946 DOA: 09/17/2022  PCP: Haywood Pao, MD  Brief History/Interval Summary: 76 y.o. 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      Consultants: Neurology.  Palliative care  Procedures: Transthoracic echocardiogram  Subjective/Interval History: Worsening mental status overnight - concerning for new onset seizure - right sided visual field neglect and poorly interactive - thought to be post-ictal state. Neuro consulted overnight for code stroke.  Assessment/Plan:  Goals of care -Unfortunately patient's clinical status has acutely worsened - her event overnight triggered an MRI which shows 4 new additional strokes on top of her previously indicated strokes as below. -Given her worsening mental status recurrent multiple strokes and possible new provoked seizure in the setting of multiple CVAs lengthy discussion with husband at bedside today about transitioning the patient to palliative care and hospice. -Palliative care following, at this time we will continue supportive care in the hospital, patient's husband will call their children and other family members to visit in the next 24 hours, plan to remain in hospital for likely in-hospital demise at this time.  Should patient stabilize after further discussion with family transition to hospice house or home with hospice may be reasonable however at this time patient is not stable for transfer. -Initiate morphine drip per palliative care  Acute recurrent stroke Initial MRI raised concern for embolic stroke.  For new strokes noted overnight on imaging. Echocardiogram shows normal systolic function.  Moderate pericardial effusion was noted. LDL is 81.  HbA1c is 6.2. C Previous plan to continue current measures and transition to SNF for rehab  canceled given above  Acute metabolic encephalopathy Likely in the setting of acute on chronic strokes - multiple recurrent strokes (now had upwards of 6 strokes in the past as well as prior brain surgery) -Patient had 4 new strokes noted overnight on imaging -Concern for provoked seizure given above, continue palliative care and supportive care at this time per discussion with husband  Oropharyngeal dysphagia Will discontinue therapy given above, comfort feedings are certainly reasonable at this point, patient remains high risk for aspiration however, family aware  Moderate pericardial effusion Noted on echocardiogram.  No tamponade physiology.  Essential hypertension with borderline hypotension Discontinued vitals and medications given above, continue comfort measures  Nonsustained ventricular tachycardia Follow along clinically, supportive care only ongoing-continued metoprolol is certainly reasonable if patient is able to take p.o. safely  Hypokalemia/hypophosphatemia No further labs given above  Hyperlipidemia Continue statin if patient able to take p.o. safely  History of mitral valve replacement I will discontinue all medications, despite heparin drip patient continues to have what appears to be embolic strokes  Normocytic anemia/mild thrombocytopenia No further labs  Diabetes mellitus type 2 No further labs, comfort feeding as indicated  DVT Prophylaxis: On warfarin Code Status: DNR Family Communication: Husband updated at bedside Disposition Plan: Skilled nursing facility recommended by PT -will need to be able to tolerate appropriate caloric intake for NG tube removal prior to discharge.  Status is: Inpatient Remains inpatient appropriate because: Acute stroke  Medications: Scheduled:   stroke: early stages of recovery book   Does not apply Once   aspirin  81 mg Per Tube Daily   atorvastatin  40 mg Per Tube Daily   famotidine  20 mg Per Tube BID   feeding  supplement (PROSource TF20)  60 mL Per  Tube Daily   free water  200 mL Per Tube Q8H   insulin aspart  0-15 Units Subcutaneous Q4H   metoprolol tartrate  12.5 mg Per Tube BID   PARoxetine  10 mg Per Tube q AM   warfarin  7.5 mg Oral ONCE-1600   Warfarin - Pharmacist Dosing Inpatient   Does not apply q1600   Continuous:  feeding supplement (JEVITY 1.5 CAL/FIBER) 1,000 mL (10/06/22 0823)   heparin 950 Units/hr (10/06/22 1952)   levETIRAcetam 500 mg (10/07/22 9562)   methocarbamol (ROBAXIN) IV 500 mg (10/06/22 1337)   [id number] **OR** acetaminophen (TYLENOL) oral liquid 160 mg/5 mL **OR** acetaminophen, bisacodyl, methocarbamol (ROBAXIN) IV  Antibiotics: Anti-infectives (From admission, onward)    None       Objective:  Vital Signs  Vitals:   10/07/22 0300 10/07/22 0318 10/07/22 0602 10/07/22 0726  BP: 101/63  139/79 (!) 143/77  Pulse: 90  98 (!) 115  Resp: (!) 23  (!) 23 (!) 24  Temp: 97.8 F (36.6 C)  98.5 F (36.9 C) (!) 100.6 F (38.1 C)  TempSrc: Axillary  Axillary Oral  SpO2: 95%  94% 96%  Weight:  53.6 kg    Height:        Intake/Output Summary (Last 24 hours) at 10/07/2022 0734 Last data filed at 10/07/2022 0329 Gross per 24 hour  Intake --  Output 225 ml  Net -225 ml    Filed Weights   10/05/22 0500 10/06/22 0442 10/07/22 0318  Weight: 52.6 kg 113.6 kg 53.6 kg    General: Resting comfortably, no acute distress HEENT:  Normocephalic atraumatic.  Sclerae nonicteric, noninjected.  Extraocular movements intact bilaterally. Neck:  Without mass or deformity.  Trachea is midline. Lungs:  Clear to auscultate bilaterally without rhonchi, wheeze, or rales. Heart:  Regular rate and rhythm.  Without murmurs, rubs, or gallops. Abdomen:  Soft, nontender, nondistended.  Without guarding or rebound. Extremities: Without cyanosis, clubbing, edema, or obvious deformity.   Lab Results:  Data Reviewed: I have personally reviewed following labs and reports  of the imaging studies  CBC: Recent Labs  Lab 10/02/22 0708 10/04/22 0348 10/05/22 0501 10/06/22 0251 10/07/22 0616  WBC 6.1 8.0 6.8 6.0 12.6*  HGB 11.3* 12.4 11.3* 10.8* 11.4*  HCT 35.0* 37.5 35.0* 34.1* 36.2  MCV 83.7 82.6 83.7 86.3 85.6  PLT 150 157 153 153 196     Basic Metabolic Panel: Recent Labs  Lab 10/01/22 0238 10/01/22 1747 10/02/22 0708 10/02/22 1736 10/03/22 0321 10/05/22 0501 10/06/22 0740  NA  --   --  138  --  140 136  --   K  --   --  3.3*  --  4.2 3.8  --   CL  --   --  107  --  105 99  --   CO2  --   --  23  --  25 29  --   GLUCOSE  --   --  169*  --  175* 168*  --   BUN  --   --  26*  --  28* 28*  --   CREATININE  --   --  0.61  --  0.59 0.67  --   CALCIUM  --   --  9.0  --  8.9 9.0  --   MG 1.7 1.7 1.9 2.2 2.0 1.8 2.0  PHOS 3.3 2.6 2.2* 1.8*  --  3.5  --      GFR: Estimated Creatinine Clearance: 50.6  mL/min (by C-G formula based on SCr of 0.67 mg/dL).  Liver Function Tests: Recent Labs  Lab 10/02/22 0708  AST 14*  ALT 10  ALKPHOS 72  BILITOT 0.4  PROT 5.6*  ALBUMIN 3.1*      Coagulation Profile: Recent Labs  Lab 10/03/22 0321 10/04/22 0348 10/05/22 0501 10/06/22 0251 10/07/22 0616  INR 2.4* 1.5* 1.6* 1.9* 3.0*     CBG: Recent Labs  Lab 10/06/22 1148 10/06/22 1534 10/06/22 2004 10/06/22 2314 10/07/22 0314  GLUCAP 146* 154* 109* 142* 127*      Radiology Studies: No results found.     LOS: 9 days   Little Ishikawa  [facility] Pager on [email or web address]  10/07/2022, 7:34 AM

## 2022-10-07 NOTE — Progress Notes (Signed)
Daily Progress Note   Patient Name: [name]       Date: 10/07/2022 DOB: 03-Oct-1946  Age: 76 y.o. MRN#: [id number] Attending Physician: Little Ishikawa, MD Primary Care Physician: Haywood Pao, MD Admit Date: 09/15/2022  Reason for Consultation/Follow-up: Establishing goals of care  Subjective: Chart review performed including progress notes, labs, imaging.  Patient assessed at the bedside.  She has worsened since yesterday, diaphoretic and dyspneic with excessive secretions audible.  She has just returned from MRI.  Her husband is present visiting.  We discussed overnight events including patient's seizures and that patient is unlikely to recover from this, as she was already having great difficulty with her oral intake and waxing/waning mental status.  Discussed that she is suffering and patient's husband is understanding and agrees.  With previous conversations about her wishes in mind, he has made decision for transition to comfort care today.   Reviewed that patient would no longer receive aggressive medical interventions such as continuous vital signs, lab work, radiology testing, or medications not focused on comfort. All care would focus on how the patient is looking and feeling. This would include management of any symptoms that may cause discomfort, pain, shortness of breath, cough, nausea, agitation, anxiety, and/or secretions etc. Symptoms would be managed with medications and other non-pharmacological interventions such as spiritual support if requested, repositioning, music therapy, or therapeutic listening.  [name] verbalized his understanding and appreciation.   We discussed disposition options including hospice facility and hospice at SNF; he would like for her to return  to [facility] to be near him.  Discussed options for symptom management and scheduling sublingual morphine to monitor her response, with the hope that this could be continued at SNF.  Counseled on the appropriateness of opioids for both pain and dyspnea.  Provided updates to the team via secure chat.  Returned to the bedside and patient is still with air hunger after first dose of PRN IV morphine.  Discussed with patient's husband that unfortunately, her symptoms likely would not be properly managed with sublingual medications at SNF.  Also provided with updates on MRI results.  Counseled on the need to escalate to morphine infusion and he is agreeable.  Adjusted frequency of IV morphine pushes to provide additional dosages while coordinating initiation of morphine infusion.   Returned to  the bedside after 3 doses of PRN IV morphine, patient with some improvement of dyspnea and marked improvement of secretions.  Emotional support and therapeutic listening provided to patient's husband.  Spiritual care was offered and he politely declined.  Return to the bedside again after initiation of morphine infusion.  Patient is no longer using accessory muscles but does remain tachypneic.  Her son and daughter-in-law have joined at the bedside.  I increased rate of morphine infusion to 4 mg/hr and weaned oxygen to 2 L, then assessed patient response.  Counseled on the process of titrating morphine drip and weaning oxygen to room air as patient's comfort allows.  We discussed the option to transfer to unit such as 6 N., as patient's husband plans to stay the night.  He prefers to avoid moving her for now.  Discussed with RN.  All questions and concerns addressed. Encouraged to call with questions and/or concerns. PMT card previously provided.  Length of Stay: 9  Physical Exam Vitals and nursing note reviewed.  Constitutional:      General: She is awake. She is in acute distress.     Appearance: She is  ill-appearing and diaphoretic.     Interventions: Nasal cannula in place.  Cardiovascular:     Rate and Rhythm: Tachycardia present.  Pulmonary:     Effort: Tachypnea and accessory muscle usage present.  Neurological:     Mental Status: She is unresponsive.     Motor: Weakness present.            Vital Signs: BP (!) 143/77 (BP Location: Left Arm)   Pulse (!) 115   Temp (!) 100.6 F (38.1 C) (Oral)   Resp (!) 24   Ht '5\' 6"'$  (1.676 m)   Wt 53.6 kg   SpO2 96%   BMI 19.08 kg/m  SpO2: SpO2: 96 % O2 Device: O2 Device: Nasal Cannula O2 Flow Rate: O2 Flow Rate (L/min): 2 L/min  Palliative Assessment/Data: PPS 30% with tube feeds   Palliative Care Assessment & Plan   Patient Profile: 76 y.o. female  with past medical history of  DM, multiple CVAs with hemiparesis, MVR on Coumadin, and HTN presented to ED on 09/12/2022 from [facility] with stroke-like symptoms - left sided gaze, right sided weakness. Patient was admitted on 10/04/2022 with acute CVA.    Of note, patient has had 3 hospital admissions in the last 6 months and is a 30 day readmission.  Assessment: Principal Problem:   Acute CVA (cerebrovascular accident) ([place]) Active Problems:   Diabetes mellitus without complication ([place])   H/O mitral valve replacement with mechanical valve   HTN (hypertension)   HLD (hyperlipidemia)   DNR (do not resuscitate)   Protein-calorie malnutrition, severe    Recommendations/Plan: Continue DNR Transition to comfort focused care today after discussion with patient's husband Ordered morphine drip and boluses PRN for pain/air hunger/comfort Robinul 0.4 mg IV every 4 hours for excessive secretions Ativan PRN for agitation/anxiety/seizures Zofran PRN for nausea Liquifilm tears PRN for dry eyes Comfort cart for family Unrestricted visitations in the setting of EOL (per policy) Oxygen PRN 2L or less for comfort. No escalation.   Spiritual care consult declined Psychosocial and emotional  support provided PMT will continue to follow and support   Prognosis: Hours to Days  Discharge Planning: Anticipated Hospital Death  Care plan was discussed with patient's husband, son, daughter-in-law, RN, TOC, Dr. Avon Gully    Total time: I spent 120 minutes in the care of the patient today  in the above activities and documenting the encounter.  MDM: High  Johnell Comings Palliative Medicine Team Team phone # [phone]  Thank you for allowing the Palliative Medicine Team to assist in the care of this patient. Please utilize secure chat with additional questions, if there is no response within 30 minutes please call the above phone number.  Palliative Medicine Team providers are available by phone from 7am to 7pm daily and can be reached through the team cell phone.  Should this patient require assistance outside of these hours, please call the patient's attending physician.

## 2022-10-07 NOTE — Progress Notes (Signed)
[place] for Heparin and Warfarin Indication:  MVR admitted for new CVA  Allergies  Allergen Reactions   Zoloft [Sertraline] Hives, Itching and Rash    Patient Measurements: Height: '5\' 6"'$  (167.6 cm) Weight: 53.6 kg (118 lb 3.2 oz) IBW/kg (Calculated) : 59.3  Heparin Dosing Weight: 50 kg  Vital Signs: Temp: 100.6 F (38.1 C) (11/30 0726) Temp Source: Oral (11/30 0726) BP: 143/77 (11/30 0726) Pulse Rate: 115 (11/30 0726)  Labs: Recent Labs    10/05/22 0501 10/05/22 1417 10/06/22 0251 10/06/22 1231 10/07/22 0616  HGB 11.3*  --  10.8*  --  11.4*  HCT 35.0*  --  34.1*  --  36.2  PLT 153  --  153  --  196  LABPROT 18.4*  --  21.6*  --  30.9*  INR 1.6*  --  1.9*  --  3.0*  HEPARINUNFRC 0.23*   < > 0.37 0.39 0.49  CREATININE 0.67  --   --   --   --    < > = values in this interval not displayed.    Estimated Creatinine Clearance: 50.6 mL/min (by C-G formula based on SCr of 0.67 mg/dL).   Assessment: 76 YO female with medical history significant for multiple strokes, recent SAH, and hx MVR on warfarin PTA who presented with R sided weakness found to have a new stroke. INR on admission supratherapeutic at 4.3 with last dose of warfarin '10mg'$  PO administered on 11/20 at ~1700. Pharmacy consulted to resume warfarin, now adding heparin infusion until INR therapeutic.   INR down 2.4 > 1.5, now trending up at 1.9. Warfarin had previously been held for 2 days due to elevated INR. Will redose at 7.'5mg'$  PO x1.   Heparin level therapeutic at 0.49 on 950 units/hr. CBC stable. Patient still having brown/tea-colored urine but no active signs of bleeding. INR now therapeutic at 3.0 (large increase from 1.9 11/29). Will stop heparin infusion now that INR therapeutic and reduce warfarin dose for today.  Goal of Therapy:  INR 2.5-3.5 Heparin level 0.3-0.5 units/ml Monitor platelets by anticoagulation protocol: Yes   Plan:  Stop heparin infusion   Give warfarin 5 mg PO x1 dose today Check heparin level daily while on heparin Check INR daily while on warfarin Continue to monitor H&H and platelets    Thank you for allowing pharmacy to be a part of this patient's care.  Ardyth Harps, PharmD Clinical Pharmacist

## 2022-10-07 NOTE — Progress Notes (Addendum)
Dr. Kennis Carina on unit to assess pt. Provided updated information on pt's status. Informed that although pt's HR has improved, continues to fluctuate in low 110 despite previous interventions. Additional bolus 250 IV given per Dr. Cheral Marker and followup with Dr. Sidney Ace if HR does not improve. Ongoing monitoring at this time.

## 2022-10-07 NOTE — Progress Notes (Signed)
Nutrition Brief Note  Chart reviewed. Noted TF orders dc and cortrak tube to be removed. Pt now transitioning to comfort care.  No further nutrition interventions planned at this time.  Please re-consult as needed.   Ranell Patrick, RD, LDN Clinical Dietitian RD pager # available in [place]  After hours/weekend pager # available in [facility]

## 2022-10-07 NOTE — TOC Progression Note (Signed)
Transition of Care [facility]) - Progression Note    Patient Details  Name: BROOKLEY SPITLER MRN: [id number] Date of Birth: 1946-10-12  Transition of Care [facility]) CM/SW Contact  Pollie Friar, RN Phone Number: 10/07/2022, 3:19 PM  Clinical Narrative:    Currently patient has transitioned to comfort care with morphine drip. Pt is not stable for transport per MD.  TOC following.   Expected Discharge Plan: [place] Barriers to Discharge: Continued Medical Work up  Expected Discharge Plan and Services Expected Discharge Plan: [place]                                               Social Determinants of Health (SDOH) Interventions    Readmission Risk Interventions     No data to display

## 2022-10-07 NOTE — Procedures (Signed)
Patient Name: [name]  MRN: [id number]  Epilepsy Attending: Lora Havens  Referring Physician/Provider: Christel Mormon, MD  Date: 10/06/2022 Duration: 24.06 mins  Patient history: 76 year old female with right sided movement and left-sided gaze.  EEG to evaluate for seizure.  Level of alertness: lethargic   AEDs during EEG study: LEV  Technical aspects: This EEG study was done with scalp electrodes positioned according to the 10-20 International system of electrode placement. Electrical activity was reviewed with band pass filter of 1-'70Hz'$ , sensitivity of 7 uV/mm, display speed of 37m/sec with a '60Hz'$  notched filter applied as appropriate. EEG data were recorded continuously and digitally stored.  Video monitoring was available and reviewed as appropriate.  Description: EEG showed continuous generalized low amplitude 2 to 3 Hz delta slowing.  Hyperventilation and photic stimulation were not performed.     ABNORMALITY - Continuous slow, generalized  IMPRESSION: This study is suggestive of severe diffuse encephalopathy, nonspecific etiology. No seizures or epileptiform discharges were seen throughout the recording.  Please note that lack of epileptiform activity during interictal EEG does not exclude the diagnosis of epilepsy.  [name] [name]

## 2022-10-07 NOTE — Progress Notes (Signed)
   10/06/22 2100  Assess: MEWS Score  Temp 98.4 F (36.9 C)  BP (!) 88/56  Pulse Rate (!) 119  Resp (!) 23  SpO2 98 %  O2 Device Nasal Cannula  O2 Flow Rate (L/min) 2 L/min   Dr. Sidney Ace notified, no changes in pt presentation. Resting soundly, snoring loudly. Scheduled metoprolol for hs, however pt's BP low. New order noted for 250 ml bolus x1 and IV digoxin. Administer scheduled metoprolol once BP improves per Dr. Sidney Ace.

## 2022-10-07 NOTE — Consult Note (Signed)
NEURO HOSPITALIST FOLLOW UP NOTE   Requesting physician: Dr. Avon Gully  Reason for Consult: New onset seizure  History obtained from: Chart     Brief review of HPI:                                                                                                                                          [name] is a 76 y.o. female with a PMHx significant for DM, 6 prior strokes (four of which occurred in 1997, 2013, 2015, 2017) with residual hemiparesis, prior brain surgery, MVR on Coumadin, and HTN who presented to the hospital on 11/21 with stroke-like symptoms.  Her husband reported that the nurse called and she was drooling and they were concerned about another stroke. At baseline she is able to talk, feeds herself in a wheelchair in her room, non-ambulatory but working with PT to try to ambulate. She was seen in consultation by Neurology on 11/21. 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 CTP was negative. EEG at that time demonstrated no ongoing seizure activity as etiology for her presentation with acute onset right hemiplegia, left gaze deviation and relatively preserved language.   Stroke work up was recommended at the time of the Neurology evaluation on 11/21, including an MRI which revealed many small scattered acute or early subacute infarcts throughout the infratentorial and supratentorial brain, which were felt most likely to be embolic; multifocal encephalomalacia and evidence of prior subarachnoid hemorrhage were also noted.  This evening, she was noted to have new onset seizure activity. Rapid response was called; documentation of their findings at that time have been reviewed: "Pt having R sided seizure with L sided gaze. SpO2-99%, HR-133. Ativan '2mg'$  given at 1913 which terminated seizure. Pt began to seize again at 2120. Pt was given additional '2mg'$  ativan at [place], again terminating seizure. After seizure terminated, pt will respond to painful  stimulation, will not follow commands or speak. Pt having periods of snoring respirations. Lungs clear t/o. Skin diaphoretic. HR-130s, SBP-100s, RR-30s, SpO2-99% on RA."  Keppra 1500 mg was loaded at 1932. EEG and MRI were ordered.   Past Medical History:  Diagnosis Date   Abnormality of gait 09/07/2016   Allergy    Diabetes mellitus without complication [facility])    Patient denies this - notes history of glucose intolerance   GERD (gastroesophageal reflux disease)    Hemiparesis and alteration of sensations as late effects of stroke ([place]) 09/07/2016   History of pneumonia 1997   Hypertension    S/P MVR (mitral valve replacement)    Mechanical mitral valve replacement at age 67 (done in [place])  // echo 7/17: EF 55-60, normal wall motion, bileaflet mechanical mitral valve prosthesis functioning normally, mild LAE, mildly reduced RVSF, small pericardial effusion  Stroke [facility]) 1997, 2013, 2015    Past Surgical History:  Procedure Laterality Date   ABDOMINAL HYSTERECTOMY     BRAIN SURGERY     BUNIONECTOMY  1993   CARDIAC VALVE REPLACEMENT  1997   TOE SURGERY  1996    Family History  Problem Relation Age of Onset   Cancer Mother        Bone   Heart disease Mother    Hyperlipidemia Mother    Hypertension Mother    Stroke Father    Hypertension Father    Heart attack Neg Hx            Social History:  reports that she has never smoked. She has never been exposed to tobacco smoke. She has never used smokeless tobacco. She reports that she does not drink alcohol and does not use drugs.  Allergies  Allergen Reactions   Zoloft [Sertraline] Hives, Itching and Rash    MEDICATIONS:                                                                                                                     Prior to Admission:  Medications Prior to Admission  Medication Sig Dispense Refill Last Dose   acetaminophen (TYLENOL) 325 MG tablet Take 1-2 tablets (325-650 mg total) by mouth every 4  (four) hours as needed for mild pain. (Patient taking differently: Take 650 mg by mouth every 6 (six) hours as needed for mild pain.)   unknown   amLODipine (NORVASC) 5 MG tablet Take 1 tablet (5 mg total) by mouth in the morning.   09/27/2022   atorvastatin (LIPITOR) 40 MG tablet Take 1 tablet (40 mg total) by mouth daily. 30 tablet 0 09/27/2022   bisacodyl (DULCOLAX) 10 MG suppository Place 10 mg rectally daily as needed for moderate constipation.   unknown   Calcium Carb-Cholecalciferol (CALCIUM + VITAMIN D3) 600-10 MG-MCG TABS Take 2 tablets by mouth daily.   09/27/2022   Cholecalciferol (VITAMIN D-3) 25 MCG (1000 UT) CAPS Take 1,000 Units by mouth in the morning.   09/27/2022   denosumab (PROLIA) 60 MG/ML SOSY injection Inject 60 mg into the skin every 6 (six) months.   unknown   Menthol, Topical Analgesic, (BIOFREEZE) 4 % GEL Apply 1 Application topically every 4 (four) hours as needed (mild pain). Apply to back   unknown   methocarbamol (ROBAXIN) 500 MG tablet Take 1 tablet (500 mg total) by mouth 3 (three) times daily. 90 tablet 0 09/27/2022   omeprazole (PRILOSEC) 40 MG capsule Take 40 mg by mouth in the morning.   09/27/2022   ondansetron (ZOFRAN) 4 MG tablet Take 4 mg by mouth every 6 (six) hours as needed for nausea or vomiting.   09/15/2022   PARoxetine (PAXIL) 10 MG tablet Take 10 mg by mouth in the morning.   09/27/2022   polyethylene glycol (MIRALAX / GLYCOLAX) 17 g packet Take 17 g by mouth 2 (two) times daily. (Patient taking differently: Take 17 g by  mouth every evening.)   09/27/2022   senna-docusate (SENOKOT-S) 8.6-50 MG tablet Take 1 tablet by mouth 2 (two) times daily.   09/27/2022   warfarin (COUMADIN) 10 MG tablet Take 10 mg by mouth every other day.   09/27/2022 at 1700   warfarin (COUMADIN) 5 MG tablet Take 5 mg by mouth every other day.   09/26/2022 at 1700   Scheduled:   stroke: early stages of recovery book   Does not apply Once   aspirin  81 mg Per Tube Daily    atorvastatin  40 mg Per Tube Daily   famotidine  20 mg Per Tube BID   feeding supplement (PROSource TF20)  60 mL Per Tube Daily   free water  200 mL Per Tube Q8H   insulin aspart  0-15 Units Subcutaneous Q4H   metoprolol tartrate  12.5 mg Per Tube BID   PARoxetine  10 mg Per Tube q AM   Warfarin - Pharmacist Dosing Inpatient   Does not apply q1600   Continuous:  feeding supplement (JEVITY 1.5 CAL/FIBER) 1,000 mL (10/06/22 0823)   heparin 950 Units/hr (10/06/22 1952)   methocarbamol (ROBAXIN) IV 500 mg (10/06/22 1337)     ROS:                                                                                                                                       Unable to obtain due to obtundation.    Blood pressure (!) 96/57, pulse 91, temperature 98.8 F (37.1 C), temperature source Axillary, resp. rate (!) 22, height '5\' 6"'$  (1.676 m), weight 113.6 kg, SpO2 98 %.   General Examination:                                                                                                       Physical Exam  HEENT-  [place]/AT. Neck is supple.    Lungs- Respirations unlabored Extremities- Warm and well perfused. Decreased muscle bulk   Neurological Examination Mental Status: Obtunded with intermittent sonorous respirations that become louder with partial arousal to sternal rub. Maximum arousal obtained with sternal rub is eyes-closed with agitated movement of LUE towards chest, but no verbal output or other purposeful movements. Not responding to calling of her name. No attempts to communicate.  Cranial Nerves: II: No blink to threat. PERRL.   III,IV, VI: Eyes are conjugate and near the midline. No nystagmus. Slight movement of eyes to oculocephalic maneuver.  VII: Face is flaccidly symmetric.  VIII:  No response to voice IX,X: Gag reflex deferred XI: Unable to assess XII: Unable to assess Motor/Sensory: Decreased tone x 4, but did move LUE weakly towards chest with sternal rub. With LUE  immobilized, RUE does not move towards sternal rub but does move slightly.  Slight movement of BLE to noxious.  Deep Tendon Reflexes: 2+ and symmetric bilateral brachioradialis with low amplitude patellar reflexes Cerebellar/Gait: Unable to assess    Lab Results: Basic Metabolic Panel: Recent Labs  Lab 09/30/22 0222 10/01/22 0238 10/01/22 0238 10/01/22 1747 10/02/22 0708 10/02/22 1736 10/03/22 0321 10/05/22 0501 10/06/22 0740  NA 141  --   --   --  138  --  140 136  --   K 3.5  --   --   --  3.3*  --  4.2 3.8  --   CL 107  --   --   --  107  --  105 99  --   CO2 25  --   --   --  23  --  25 29  --   GLUCOSE 97  --   --   --  169*  --  175* 168*  --   BUN 20  --   --   --  26*  --  28* 28*  --   CREATININE 0.69  --   --   --  0.61  --  0.59 0.67  --   CALCIUM 9.3  --   --   --  9.0  --  8.9 9.0  --   MG  --  1.7   < > 1.7 1.9 2.2 2.0 1.8 2.0  PHOS  --  3.3  --  2.6 2.2* 1.8*  --  3.5  --    < > = values in this interval not displayed.    CBC: Recent Labs  Lab 10/01/22 0238 10/02/22 0708 10/04/22 0348 10/05/22 0501 10/06/22 0251  WBC 4.9 6.1 8.0 6.8 6.0  HGB 11.0* 11.3* 12.4 11.3* 10.8*  HCT 34.8* 35.0* 37.5 35.0* 34.1*  MCV 85.1 83.7 82.6 83.7 86.3  PLT 146* 150 157 153 153    Cardiac Enzymes: No results for input(s): "CKTOTAL", "CKMB", "CKMBINDEX", "TROPONINI" in the last 168 hours.  Lipid Panel: No results for input(s): "CHOL", "TRIG", "HDL", "CHOLHDL", "VLDL", "LDLCALC" in the last 168 hours.  Imaging: No results found.   Assessment: 76 y.o. female with a PMHx significant for DM, 6 prior strokes (four of which occurred in 1997, 2013, 2015, 2017) with residual hemiparesis, MVR on Coumadin, and HTN who presented to the hospital on 11/21 with stroke-like symptoms. 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 CTP was negative. EEG at that time demonstrated no ongoing seizure activity as etiology for her presentation with acute onset right hemiplegia, left  gaze deviation and relatively preserved language. MRI revealed many small scattered acute or early subacute infarcts throughout the infratentorial and supratentorial brain, which were felt most likely to be embolic; multifocal encephalomalacia and evidence of prior subarachnoid hemorrhage were also noted. This evening, she was noted to have new onset seizure activity consisting of right sided movements with left sided gaze. Seizure terminated with Ativan '2mg'$  at 1913, then patient began to seize again at [place]. Pt was given additional '2mg'$  ativan at [place], again terminating seizure. Neurology was called to further evaluate.  - Exam reveals an obtunded patient with sonorous respirations, localizing to noxious stimuli with her LUE but not her right.  - Keppra 1500  mg was loaded at 1932.  - New onset seizure activity most likely due to a latent seizure focus given her history of multiple strokes    Recommendations: - EEG completed with results pending - MRI brain has been ordered - Starting scheduled Keppra at 500 mg IV BID   Electronically signed: Dr. Kerney Elbe 10/07/2022, 1:57 AM

## 2022-10-08 DIAGNOSIS — I639 Cerebral infarction, unspecified: Secondary | ICD-10-CM | POA: Diagnosis not present

## 2022-10-08 DEATH — deceased

## 2022-11-08 NOTE — Progress Notes (Signed)
TRH night cross cover note:   I was notified by RN that this patient, who was on comfort care, has passed away. Was pronounced, with time of death noted to be midnight on Oct 17, 2022.      Gabrielle Bertin, DO Hospitalist

## 2022-11-08 NOTE — Discharge Summary (Signed)
DEATH SUMMARY   Patient Details  Name: [name] MRN: [id number] DOB: Oct 17, 1946 JEH:[id number], [name], MD  Admission/Discharge Information   Admit Date:  10/27/22  Date of Death: Date of Death: 11-06-2022  Time of Death: Time of Death: 0000  Length of Stay: 10   Principle Cause of death: Acute recurrent CVA  Hospital Diagnoses: Principal Problem:   Acute CVA (cerebrovascular accident) [facility]) Active Problems:   H/O mitral valve replacement with mechanical valve   HTN (hypertension)   Diabetes mellitus without complication ([place])   HLD (hyperlipidemia)   DNR (do not resuscitate)   Protein-calorie malnutrition, severe   Hospital Course: 77 y.o. 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 Continued to have recurrent numerous strokes despite medical therapy - ultimately family decided to transition to comfort measures/hospice 10/07/22 given poor prognosis and recurrent strokes.      The results of significant diagnostics from this hospitalization (including imaging, microbiology, ancillary and laboratory) are listed below for reference.   Significant Diagnostic Studies: MR BRAIN WO CONTRAST  Result Date: 10/07/2022 CLINICAL DATA:  C/f stroke EXAM: MRI HEAD WITHOUT CONTRAST TECHNIQUE: Multiplanar, multiecho pulse sequences of the brain and surrounding structures were obtained without intravenous contrast. COMPARISON:  MRI Brain 10/27/2022, CT Brain 10/27/22, CTA head/neck angiogram 10-27-2022 FINDINGS: Brain: Redemonstrated is sequela of prior subarachnoid hemorrhage along the left parietal convexity and in the area of infarct around the periatrial white matter on the right. However compared to prior exam, there are 4 new large regions of parenchymal hemorrhage in the left frontal lobe, right temporal lobe, right cerebellum, and in the midbrain. There is also  interval increase in subarachnoid hemorrhage along the right frontal lobe (series 9, image 76). Redemonstrated are scattered regions infarcts in the cerebellar vermis, corona radiata on the right, posterior limb of the internal capsule on the left, and left caudate head. There is no evidence of hydrocephalus or midline shift. There is chronic infarct in the left thalamus, in the left cerebellum. Vascular: Normal flow voids. Skull and upper cervical spine: Postsurgical changes from right parietal craniotomy. Sinuses/Orbits: Negative. Other: None IMPRESSION: 1. There are 4 new regions of parenchymal hemorrhage in the left frontal lobe, right temporal lobe, right cerebellum, and in the midbrain. There is also interval increase in subarachnoid hemorrhage along the right frontal lobe. No evidence of hydrocephalus or midline shift. 2. New/increased subarachnoid hemorrhage in the right frontal lobe. 3. Redemonstrated are scattered regions of acute to subacute infarcts in the cerebellar vermis, right corona radiata, posterior limb of the internal capsule on the left, and left caudate head. Electronically Signed   By: Marin Roberts M.D.   On: 10/07/2022 10:00   EEG adult  Result Date: 10/07/2022 Lora Havens, MD     10/07/2022  7:42 AM Patient Name: [name] MRN: [id number] Epilepsy Attending: Lora Havens Referring Physician/Provider: Christel Mormon, MD Date: 10/06/2022 Duration: 24.06 mins Patient history: 77 year old female with right sided movement and left-sided gaze.  EEG to evaluate for seizure. Level of alertness: lethargic AEDs during EEG study: LEV Technical aspects: This EEG study was done with scalp electrodes positioned according to the 10-20 International system of electrode placement. Electrical activity was reviewed with band pass filter of 1-'70Hz'$ , sensitivity of 7 uV/mm, display speed of 28m/sec with a '60Hz'$  notched filter applied as appropriate. EEG data were recorded continuously and  digitally stored.  Video monitoring was available and reviewed as appropriate. Description: EEG showed continuous generalized low amplitude 2 to 3 Hz delta slowing.  Hyperventilation and photic stimulation were not performed.   ABNORMALITY - Continuous slow, generalized IMPRESSION: This study is suggestive of severe diffuse encephalopathy, nonspecific etiology. No seizures or epileptiform discharges were seen throughout the recording. Please note that lack of epileptiform activity during interictal EEG does not exclude the diagnosis of epilepsy. Lora Havens   DG Swallowing Func-Speech Pathology  Result Date: 10/01/2022 Table formatting from the original result was not included. Images from the original result were not included. Objective Swallowing Evaluation: Type of Study: MBS-Modified Barium Swallow Study  Patient Details Name: [name] MRN: [id number] Date of Birth: [birth year] Today's Date: 10/01/2022 Time: SLP Start Time (ACUTE ONLY): 1010 -SLP Stop Time (ACUTE ONLY): 1022 SLP Time Calculation (min) (ACUTE ONLY): 12 min Past Medical History: Past Medical History: Diagnosis Date  Abnormality of gait 09/07/2016  Allergy   Diabetes mellitus without complication ([place])   Patient denies this - notes history of glucose intolerance  GERD (gastroesophageal reflux disease)   Hemiparesis and alteration of sensations as late effects of stroke ([place]) 09/07/2016  History of pneumonia 1997  Hypertension   S/P MVR (mitral valve replacement)   Mechanical mitral valve replacement at age 69 (done in [place])  // echo 7/17: EF 55-60, normal wall motion, bileaflet mechanical mitral valve prosthesis functioning normally, mild LAE, mildly reduced RVSF, small pericardial effusion  Stroke ([place]) 1997, 2013, 2015 Past Surgical History: Past Surgical History: Procedure Laterality Date  ABDOMINAL HYSTERECTOMY    BRAIN SURGERY    BUNIONECTOMY  1993  CARDIAC VALVE REPLACEMENT  1997  TOE SURGERY  8064 HPI: 77 year old female with  acute onset right hemiplegia with left gaze and relatively preserved language.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 Multiple strokes (1997, 2013, 2015, 2017). Has been in decline, per husband since 06/2022 and presently resides in SNF.  Subjective: alert, upright in chair with spouse present  Recommendations for follow up therapy are one component of a multi-disciplinary discharge planning process, led by the attending physician.  Recommendations may be updated based on patient status, additional functional criteria and insurance authorization. Assessment / Plan / Recommendation   10/01/2022  10:00 AM Clinical Impressions Clinical Impression Pt demonstrates mild to moderate oral dysphagia related to right lingual and labial impairment. There is slow oral manipulation and mild right sided residue (worsened if tilted right). Pt is able to masticate a solid during assessment, but would need some practice to advance to textured solids (clearing residue, following solids with liquids, positioning upright). Pt has delayed swallow initiation with liquids, aspirating nectar thick liquids before the swallow regardless of bolus size. Pts sensation is variable; she coughs with the first episode, but cough is weak. Further episodes of aspiration  are silent. Recommend initiating puree and honey thick liquids with potential to uprgrade and awareness and sensation improve. Recommend f/u with AIR. SLP Visit Diagnosis Dysphagia, oropharyngeal phase (R13.12) Impact on safety and function Moderate aspiration risk;Risk for inadequate nutrition/hydration     10/01/2022  10:00 AM Treatment Recommendations Treatment Recommendations Therapy as outlined in treatment plan below     10/01/2022  10:00 AM Prognosis Prognosis for Safe Diet Advancement Good   10/01/2022  10:00 AM Diet Recommendations SLP Diet Recommendations Dysphagia 1 (Puree) solids;Honey thick  liquids Liquid Administration via Cup;Spoon Medication Administration Crushed with puree Compensations Slow  rate;Small sips/bites;Follow solids with liquid;Lingual sweep for clearance of pocketing Postural Changes Remain semi-upright after after feeds/meals (Comment)     10/01/2022  10:00 AM Other Recommendations Follow Up Recommendations Acute inpatient rehab (3hours/day) Functional Status Assessment Patient has had a recent decline in their functional status and demonstrates the ability to make significant improvements in function in a reasonable and predictable amount of time.   10/01/2022  10:00 AM Frequency and Duration  Speech Therapy Frequency (ACUTE ONLY) min 2x/week Treatment Duration 2 weeks     10/01/2022  10:00 AM Oral Phase Oral Phase Impaired Oral - Honey Teaspoon Weak lingual manipulation;Delayed oral transit;Decreased bolus cohesion;Right pocketing in lateral sulci;Lingual/palatal residue Oral - Nectar Teaspoon Weak lingual manipulation;Delayed oral transit;Decreased bolus cohesion;Right pocketing in lateral sulci;Lingual/palatal residue Oral - Nectar Cup Weak lingual manipulation;Delayed oral transit;Decreased bolus cohesion;Right pocketing in lateral sulci;Lingual/palatal residue Oral - Nectar Straw Weak lingual manipulation;Delayed oral transit;Decreased bolus cohesion;Right pocketing in lateral sulci;Lingual/palatal residue Oral - Puree Weak lingual manipulation;Delayed oral transit;Decreased bolus cohesion;Right pocketing in lateral sulci;Lingual/palatal residue Oral - Mech Soft Weak lingual manipulation;Delayed oral transit;Decreased bolus cohesion;Right pocketing in lateral sulci;Lingual/palatal residue;Impaired mastication Oral - Pill Weak lingual manipulation;Delayed oral transit;Decreased bolus cohesion;Right pocketing in lateral sulci;Lingual/palatal residue    10/01/2022  10:00 AM Pharyngeal Phase Pharyngeal Phase Impaired Pharyngeal- Honey Teaspoon Delayed swallow initiation-vallecula  Pharyngeal- Honey Cup Delayed swallow initiation-vallecula Pharyngeal- Nectar Teaspoon Delayed swallow initiation-pyriform sinuses;Penetration/Aspiration before swallow Pharyngeal Material enters airway, CONTACTS cords and then ejected out Pharyngeal- Nectar Cup Delayed swallow initiation-pyriform sinuses;Penetration/Aspiration before swallow;Trace aspiration Pharyngeal Material enters airway, passes BELOW cords without attempt by patient to eject out (silent aspiration) Pharyngeal- Nectar Straw Penetration/Aspiration before swallow;Delayed swallow initiation-pyriform sinuses Pharyngeal Material enters airway, passes BELOW cords without attempt by patient to eject out (silent aspiration) Pharyngeal- Puree Delayed swallow initiation-vallecula Pharyngeal- Mechanical Soft Delayed swallow initiation-vallecula Pharyngeal- Pill Delayed swallow initiation-vallecula;Pharyngeal residue - valleculae     No data to display    DeBlois, Katherene Ponto 10/01/2022, 11:15 AM                     DG Abd 1 View  Result Date: 10/01/2022 CLINICAL DATA:  Encounter for feeding tube placement EXAM: ABDOMEN - 1 VIEW COMPARISON:  CT 08/27/2022 FINDINGS: Feeding tube tip overlies the stomach. No evidence of bowel obstruction. Moderate stool burden. Contrast material is in the stomach and small bowel. IMPRESSION: Feeding tube tip overlies the stomach. Electronically Signed   By: Maurine Simmering M.D.   On: 10/01/2022 10:44   ECHOCARDIOGRAM COMPLETE  Result Date: 09/23/2022    ECHOCARDIOGRAM REPORT   Patient Name:   ANNA-MARIE COLLER Date of Exam: 09/27/2022 Medical Rec #:  [id number]      Height:       66.0 in Accession #:    [id number]     Weight:       118.2 lb Date of Birth:  [birth year]      BSA:          1.599 m Patient Age:    74 years       BP:           117/84 mmHg Patient Gender: F              HR:           111 bpm. Exam Location:  Inpatient Procedure: 2D Echo Indications:    stroke  History:        Patient has prior history of  Echocardiogram examinations, most                 recent 07/02/2020. Stroke, MV replacement; Risk Factors:Diabetes                 and Hypertension.  Sonographer:    Harvie Junior Referring Phys: [place]  Sonographer Comments: Technically difficult study due to poor echo windows. IMPRESSIONS  1. Left ventricular ejection fraction, by estimation, is 70 to 75%. The left ventricle has hyperdynamic function. The left ventricle demonstrates regional wall motion abnormalities (abnormal septal motion). Left ventricular diastolic parameters are indeterminate.  2. Right ventricular systolic function is hyperdynamic. The right ventricular size is not well visualized. There is normal pulmonary artery systolic pressure.  3. Moderate pericardial effusion, worst at LV posterior. The pericardial effusion is circumferential and posterior to the left ventricle. There is no mitral valve inflow variation seen. There is no RV collapse. There is no IVC plethora  4. The mitral valve has been replaced. No evidence of mitral valve regurgitation. Study is inadequate to view prosthetic function: significant tachycardia during exam (up to at least 155 bpm). This with off axis imaging makes this study technically difficult. The mean gradient of 4 despite tachycardia is encouraging of the valve function  5. Tricuspid valve regurgitation is moderate to severe.  6. The aortic valve was not well visualized. Aortic valve regurgitation is not visualized. Conclusion(s)/Recommendation(s): Consider reimaging after heart rate control. FINDINGS  Left Ventricle: Left ventricular ejection fraction, by estimation, is 70 to 75%. The left ventricle has hyperdynamic function. The left ventricle demonstrates regional wall motion abnormalities. The left ventricular internal cavity size was small. There  is no left ventricular hypertrophy. Left ventricular diastolic parameters are indeterminate.  LV Wall Scoring: The anterior septum, mid inferoseptal  segment, and basal inferoseptal segment are hypokinetic. The basal inferolateral segment is normal. Right Ventricle: The right ventricular size is not well visualized. Right vetricular wall thickness was not well visualized. Right ventricular systolic function is hyperdynamic. There is normal pulmonary artery systolic pressure. The tricuspid regurgitant velocity is 2.74 m/s, and with an assumed right atrial pressure of 3 mmHg, the estimated right ventricular systolic pressure is 93.8 mmHg. Left Atrium: Left atrial size was not well visualized. Right Atrium: Right atrial size was not well visualized. Pericardium: A moderately sized pericardial effusion is present. The pericardial effusion is circumferential and posterior to the left ventricle. Mitral Valve: The mitral valve has been repaired/replaced. No evidence of mitral valve regurgitation. MV peak gradient, 13.1 mmHg. The mean mitral valve gradient is 4.0 mmHg. Tricuspid Valve: The tricuspid valve is normal in structure. Tricuspid valve regurgitation is moderate to severe. Aortic Valve: The aortic valve was not well visualized. Aortic valve regurgitation is not visualized. Pulmonic Valve: The pulmonic valve was not well visualized. Pulmonic valve regurgitation is not visualized. Aorta: The aortic root and ascending aorta are structurally normal, with no evidence of dilitation. IAS/Shunts: The interatrial septum was not well visualized.  LEFT VENTRICLE PLAX 2D LVIDd:         3.20 cm     Diastology LVIDs:         2.40 cm     LV e' medial:    11.60 cm/s LV PW:         0.90 cm     LV E/e' medial:  17.2 LV IVS:        1.10 cm     LV e' lateral:   13.60 cm/s LVOT diam:  1.80 cm     LV E/e' lateral: 14.6 LV SV:         24 LV SV Index:   15 LVOT Area:     2.54 cm  LV Volumes (MOD) LV vol d, MOD A4C: 64.8 ml LV vol s, MOD A4C: 28.8 ml LV SV MOD A4C:     64.8 ml RIGHT VENTRICLE RV Basal diam:  3.20 cm RV Mid diam:    2.70 cm RV S prime:     7.94 cm/s TAPSE (M-mode):  1.2 cm LEFT ATRIUM           Index        RIGHT ATRIUM           Index LA diam:      2.60 cm 1.63 cm/m   RA Area:     11.90 cm LA Vol (A4C): 24.5 ml 15.32 ml/m  RA Volume:   29.00 ml  18.14 ml/m  AORTIC VALVE             PULMONIC VALVE LVOT Vmax:   86.70 cm/s  PV Vmax:       0.87 m/s LVOT Vmean:  55.200 cm/s PV Peak grad:  3.0 mmHg LVOT VTI:    0.096 m  AORTA Ao Root diam: 3.70 cm Ao Asc diam:  3.60 cm MITRAL VALVE                TRICUSPID VALVE MV Area (PHT): 5.62 cm     TR Peak grad:   30.0 mmHg MV Area VTI:   0.87 cm     TR Vmax:        274.00 cm/s MV Peak grad:  13.1 mmHg MV Mean grad:  4.0 mmHg     SHUNTS MV Vmax:       1.81 m/s     Systemic VTI:  0.10 m MV Vmean:      78.7 cm/s    Systemic Diam: 1.80 cm MV Decel Time: 135 msec MV E velocity: 199.00 cm/s MV A velocity: 50.70 cm/s MV E/A ratio:  3.93 Rudean Haskell MD Electronically signed by Rudean Haskell MD Signature Date/Time: 10/07/2022/6:07:59 PM    Final    MR BRAIN WO CONTRAST  Result Date: 09/12/2022 CLINICAL DATA:  L Stroke, follow up EXAM: MRI HEAD WITHOUT CONTRAST TECHNIQUE: Multiplanar, multiecho pulse sequences of the brain and surrounding structures were obtained without intravenous contrast. COMPARISON:  Same day CT head.  MRI head December 28, 2021. FINDINGS: Brain: Multiple acute or early subacute infarcts involving the paramedian right cerebellum, left pons, bilateral hippocampi, left basal ganglia, left posterior limb of the internal capsule and scattered throughout the cerebral cortex bilaterally. No evidence of acute hemorrhage, mass lesion, midline shift or hydrocephalus. Chronic areas of posterior frontal, right parietal and to a lesser extent the left temporal/occipital junction encephalomalacia. Multiple remote cerebellar infarcts. Patchy T2/FLAIR hyperintensities the white matter, compatible with carotid leak [place] vascular ischemic disease. Superficial siderosis along the cerebral convexities bilaterally,  compatible with prior subarachnoid hemorrhage. Vascular: Evaluated on same day CTA. Skull and upper cervical spine: Right-sided parietal craniotomy. Otherwise, normal marrow signal. Sinuses/Orbits: Negative. Other: No sizable mastoid effusions. IMPRESSION: 1. Many small scattered acute or early subacute infarcts throughout the infratentorial and supratentorial brain, detailed above and probably embolic. 2. Multifocal encephalomalacia. 3. Evidence of prior subarachnoid hemorrhage. Electronically Signed   By: Margaretha Sheffield M.D.   On: 09/17/2022 15:08   EEG adult  Result Date: 10/06/2022 Lora Havens, MD  09/18/2022 11:23 AM Patient Name: MARIAHA ELLINGTON MRN: [id number] Epilepsy Attending: Lora Havens Referring Physician/Provider: Greta Doom, MD Date: 09/11/2022 Duration: 22.05 mins Patient history: 77 year old female presented with forced left gaze, right sided weakness/sensory deficits.  EEG to evaluate for seizure. Level of alertness:  lethargic AEDs during EEG study: None Technical aspects: This EEG study was done with scalp electrodes positioned according to the 10-20 International system of electrode placement. Electrical activity was reviewed with band pass filter of 1-'70Hz'$ , sensitivity of 7 uV/mm, display speed of 66m/sec with a '60Hz'$  notched filter applied as appropriate. EEG data were recorded continuously and digitally stored.  Video monitoring was available and reviewed as appropriate. Description: EEG showed continuous low amplitude 2 to 3 Hz delta slowing in left hemisphere.  Additionally there was 5 to 8 Hz theta-alpha activity admixed with 12 to 15 Hz beta activity in right hemisphere.  Hyperventilation and photic stimulation were not performed.   ABNORMALITY -Continuous slow, generalized and lateralized left hemisphere IMPRESSION: This study is suggestive of cortical dysfunction in left hemisphere, likely secondary to underlying structural abnormality, postictal state.   Additionally there was moderate diffuse encephalopathy, nonspecific etiology.  No seizures or epileptiform discharges were seen throughout the recording. Priyanka [name]  CT ANGIO HEAD NECK W WO CM W PERF (CODE STROKE)  Result Date: 09/30/2022 CLINICAL DATA:  77year old female code stroke presentation with leftward gaze, aphasia. EXAM: CT ANGIOGRAPHY HEAD AND NECK CT PERFUSION BRAIN TECHNIQUE: Multidetector CT imaging of the head and neck was performed using the standard protocol during bolus administration of intravenous contrast. Multiplanar CT image reconstructions and MIPs were obtained to evaluate the vascular anatomy. Carotid stenosis measurements (when applicable) are obtained utilizing NASCET criteria, using the distal internal carotid diameter as the denominator. Multiphase CT imaging of the brain was performed following IV bolus contrast injection. Subsequent parametric perfusion maps were calculated using RAPID software. RADIATION DOSE REDUCTION: This exam was performed according to the departmental dose-optimization program which includes automated exposure control, adjustment of the mA and/or kV according to patient size and/or use of iterative reconstruction technique. CONTRAST:  713mOMNIPAQUE IOHEXOL 350 MG/ML SOLN COMPARISON:  Plain head CT 0932 hours today. Previous CTA head and neck 04/26/2022 and earlier. FINDINGS: CT Brain Perfusion Findings: ASPECTS: 10 (chronic encephalomalacia) CBF (<30%) Volume: 75m64merfusion (Tmax>6.0s) volume: 75mL58msmatch Volume: Not applicable Infarction Location:None detected CTA NECK Skeleton: Cervical spine degeneration appears stable. Chronic right vertex craniotomy. No acute osseous abnormality identified. Upper chest: Stable, negative. Other neck: Stable, negative. Aortic arch: Stable mild tortuosity of the aortic arch. Three vessel arch configuration. Mild for age arch atherosclerosis. Right carotid system: Minimal brachiocephalic artery plaque without  stenosis. Mildly tortuous right CCA with no plaque or stenosis. No significant cervical right ICA plaque or stenosis. Left carotid system: Left CCA origin remains normal. Minimal left CCA plaque without stenosis before the bifurcation. Mild mostly calcified plaque at the left carotid bifurcation. No cervical left ICA stenosis. Vertebral arteries: Minimal plaque in the proximal right subclavian artery. Normal right vertebral artery origin. Patent right vertebral to the skull base with no plaque or stenosis. Stable mild mostly calcified plaque proximal left subclavian artery without stenosis. Normal left vertebral artery origin. Patent mildly dominant left vertebral artery to the skull base with no plaque or stenosis. CTA HEAD Posterior circulation: Stable distal vertebral arteries without plaque or stenosis. Left V4 segment is dominant. Right PICA origin remains patent. However, the Left PICA origin appears to be occluded  now, was normal in June. Small caliber left AICA appears to remain patent. There is some reconstituted left PICA flow. Vertebrobasilar junction and basilar artery appear stable, negative. SCA and right PCA origin remain normal. Fetal type left PCA origin redemonstrated with small tortuous left P1. Bilateral PCA branches are stable and within normal limits. Anterior circulation: Both ICA siphons are patent. No siphon plaque or stenosis. Normal ophthalmic and left posterior communicating artery origins. Patent carotid termini, MCA and ACA origins. Tortuous and mildly dominant right A1 segment redemonstrated. Normal anterior communicating artery. Bilateral ACA branches are stable and within normal limits. Right MCA M1 segment, bifurcation, and right MCA branches are stable and within normal limits. Left MCA M1 segment, bifurcation, and left MCA branches are stable and within normal limits. Venous sinuses: Not well evaluated due to early contrast timing. Anatomic variants: Fetal left PCA origin. Mildly  dominant left vertebral artery and right ACA A1 segments. Review of the MIP images confirms the above findings IMPRESSION: 1. 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 Faint left PICA reconstituted enhancement. 2. But otherwise stable and largely negative for age CTA Head and Neck; mild for age atherosclerosis and no other arterial stenosis or occlusion identified. 3. CT Perfusion detects no infarct core or ischemic penumbra. Salient findings were communicated to Dr. Leonel Ramsay at 10:04 am on 10/07/2022 by text page via the [facility] messaging system. Electronically Signed   By: Genevie Ann M.D.   On: 09/08/2022 10:09   CT HEAD CODE STROKE WO CONTRAST  Result Date: 10/04/2022 CLINICAL DATA:  Code stroke. 77 year old female with aphasia and left gaze. EXAM: CT HEAD WITHOUT CONTRAST TECHNIQUE: Contiguous axial images were obtained from the base of the skull through the vertex without intravenous contrast. RADIATION DOSE REDUCTION: This exam was performed according to the departmental dose-optimization program which includes automated exposure control, adjustment of the mA and/or kV according to patient size and/or use of iterative reconstruction technique. COMPARISON:  Brain MRI 12/28/2021.  Head CT 08/27/2022. FINDINGS: Brain: Chronic posterior frontal, right parietal, and lesser temporal/occipital junction encephalomalacia appears stable with overlying craniotomy. No midline shift, mass effect, or evidence of intracranial mass lesion. No acute intracranial hemorrhage identified. 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 No cortically based acute infarct identified. Vascular: Mild Calcified atherosclerosis at the skull base. No suspicious intracranial vascular hyperdensity. Skull: Previous right posterior craniotomy is  stable. No acute osseous abnormality identified. Sinuses/Orbits: Visualized paranasal sinuses and mastoids are stable and well aerated. Other: Leftward gaze is new. Chronic postoperative changes to the scalp. ASPECTS [facility] Stroke Program Early CT Score) Total score (0-10 with 10 being normal): 10 IMPRESSION: 1. No acute cortically based infarct or acute intracranial hemorrhage identified. ASPECTS 10. 2. Stable non contrast CT appearance of the brain, with chronic small vessel ischemia and chronic right hemisphere encephalomalacia, craniotomy. 3. These results were communicated to Dr. Leonel Ramsay at 9:41 am on 09/23/2022 by text page via the [facility] messaging system. Electronically Signed   By: Genevie Ann M.D.   On: 09/10/2022 09:41    Microbiology: No results found for this or any previous visit (from the past 240 hour(s)).  Time spent: 40 minutes  Signed: Holli Humbles DO 10-25-2022

## 2022-11-08 NOTE — Progress Notes (Signed)
Pt pronounced deceased at [facility] 10/10/2022. Verified by this nurse and [place] charge nurse. Husband at bedside at time of death. Morphine 55 ml wasted by this nurse and Philomena Obasogie,RN.

## 2022-11-08 DEATH — deceased

## 2022-12-24 ENCOUNTER — Ambulatory Visit: Payer: Medicare Other | Admitting: Physical Medicine & Rehabilitation
